# Patient Record
Sex: Male | Born: 1960 | Race: Black or African American | Hispanic: No | Marital: Single | Smoking: Never smoker
Health system: Southern US, Community
[De-identification: ages and names within clinical notes are randomized; demographics above are authoritative.]

## PROBLEM LIST (undated history)

## (undated) DIAGNOSIS — N182 Chronic kidney disease, stage 2 (mild): Secondary | ICD-10-CM

## (undated) DIAGNOSIS — F1010 Alcohol abuse, uncomplicated: Secondary | ICD-10-CM

## (undated) DIAGNOSIS — I5042 Chronic combined systolic (congestive) and diastolic (congestive) heart failure: Secondary | ICD-10-CM

## (undated) DIAGNOSIS — F1410 Cocaine abuse, uncomplicated: Secondary | ICD-10-CM

## (undated) DIAGNOSIS — Z9119 Patient's noncompliance with other medical treatment and regimen: Secondary | ICD-10-CM

## (undated) DIAGNOSIS — D649 Anemia, unspecified: Secondary | ICD-10-CM

## (undated) DIAGNOSIS — I10 Essential (primary) hypertension: Secondary | ICD-10-CM

## (undated) DIAGNOSIS — I2510 Atherosclerotic heart disease of native coronary artery without angina pectoris: Secondary | ICD-10-CM

## (undated) DIAGNOSIS — I255 Ischemic cardiomyopathy: Secondary | ICD-10-CM

## (undated) DIAGNOSIS — E785 Hyperlipidemia, unspecified: Secondary | ICD-10-CM

## (undated) DIAGNOSIS — Z91199 Patient's noncompliance with other medical treatment and regimen due to unspecified reason: Secondary | ICD-10-CM

## (undated) NOTE — *Deleted (*Deleted)
CRITICAL VALUE ALERT  Critical Value:  Lactic acid 2.3  Date & Time Notied:  09/08/20  Provider Notified: X. Blount  Orders Received/Actions taken:

---

## 2013-04-11 ENCOUNTER — Emergency Department (HOSPITAL_COMMUNITY)
Admission: EM | Admit: 2013-04-11 | Discharge: 2013-04-11 | Disposition: A | Discharge status: Home / Self Care | Payer: Self-pay | Attending: Emergency Medicine | Admitting: Emergency Medicine

## 2013-04-11 DIAGNOSIS — M7989 Other specified soft tissue disorders: Secondary | ICD-10-CM | POA: Insufficient documentation

## 2013-04-11 DIAGNOSIS — IMO0001 Reserved for inherently not codable concepts without codable children: Secondary | ICD-10-CM

## 2013-04-11 DIAGNOSIS — L03019 Cellulitis of unspecified finger: Secondary | ICD-10-CM | POA: Insufficient documentation

## 2013-04-11 MED ORDER — HYDROCODONE-ACETAMINOPHEN 5-325 MG PO TABS
1.0000 | ORAL_TABLET | Freq: Four times a day (QID) | ORAL | Status: DC | PRN
Start: 2013-04-11 — End: 2013-06-17

## 2013-04-11 MED ORDER — BUPIVACAINE HCL 0.5 % IJ SOLN
50.0000 mL | Freq: Once | INTRAMUSCULAR | Status: DC
Start: 2013-04-11 — End: 2013-04-11
  Filled 2013-04-11: qty 50

## 2013-04-11 NOTE — ED Provider Notes (Signed)
History     CSN: [id number]  Arrival date & time 04/11/13  0210   First MD Initiated Contact with Patient 04/11/13 0220      Chief Complaint  Patient presents with  . Finger Pain     (Consider location/radiation/quality/duration/timing/severity/associated sxs/prior treatment) HPI This is a 52 year old male who was working in the garden was bitten on the tip of his right middle finger by a green snake 34 days ago. Subsequently he has developed pain and swelling alongside the age of that finger nail. There is moderate associated pain, worse with palpation. Motor and sensory function remained intact. He denies systemic symptoms.  No past medical history on file.  No past surgical history on file.  No family history on file.  History  Substance Use Topics  . Smoking status: Not on file  . Smokeless tobacco: Not on file  . Alcohol Use: Not on file      Review of Systems  All other systems reviewed and are negative.    Allergies  Review of patient's allergies indicates not on file.  Home Medications  No current outpatient prescriptions on file.  BP 168/96  Pulse 73  Temp(Src) 98 F (36.7 C) (Oral)  Resp 16  SpO2 99%  Physical Exam General: Well-developed, well-nourished male in no acute distress; appearance consistent with age of record HENT: normocephalic, atraumatic Eyes: pupils equal round and reactive to light; extraocular muscles intact; arcus senilis bilaterally Neck: supple Heart: regular rate and rhythm Lungs: clear to auscultation bilaterally Abdomen: soft; nondistended Extremities: No deformity; full range of motion; pulses normal; swelling and tenderness around the nail of the right middle finger consistent with paronychia; fingerpad soft with no evidence of a felon Neurologic: Awake, alert and oriented; motor function intact in all extremities and symmetric; no facial droop Skin: Warm and dry Psychiatric: Normal mood and affect    ED Course   Procedures (including critical care time)  INCISION AND DRAINAGE Performed by: Paula Libra L Consent: Verbal consent obtained. Risks and benefits: risks, benefits and alternatives were discussed Type: Paronychia   Body area: Right middle finger  Anesthesia: Digital block  Incision was made with a scalpel.  Local anesthetic: Bupivacaine 0.5% without epinephrine   Anesthetic total: 5 ml  Complexity: Simple Blunt dissection to break up loculations  Drainage: purulent  Drainage amount: Moderate   Packing material: None   Patient tolerance: Patient tolerated the procedure well with no immediate complications.     MDM          Hanley Seamen, MD 04/11/13 [phone]
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
## 2013-04-11 NOTE — ED Notes (Signed)
The patient is AOx4 and comfortable with his discharge instructions. 

## 2013-06-17 ENCOUNTER — Encounter (HOSPITAL_COMMUNITY): Payer: Self-pay | Admitting: *Deleted

## 2013-06-17 ENCOUNTER — Emergency Department (HOSPITAL_COMMUNITY)
Admission: EM | Admit: 2013-06-17 | Discharge: 2013-06-17 | Disposition: A | Discharge status: Home / Self Care | Payer: Self-pay | Attending: Emergency Medicine | Admitting: Emergency Medicine

## 2013-06-17 DIAGNOSIS — F17200 Nicotine dependence, unspecified, uncomplicated: Secondary | ICD-10-CM | POA: Insufficient documentation

## 2013-06-17 DIAGNOSIS — L03011 Cellulitis of right finger: Secondary | ICD-10-CM

## 2013-06-17 DIAGNOSIS — L03019 Cellulitis of unspecified finger: Secondary | ICD-10-CM | POA: Insufficient documentation

## 2013-06-17 DIAGNOSIS — I10 Essential (primary) hypertension: Secondary | ICD-10-CM | POA: Insufficient documentation

## 2013-06-17 HISTORY — DX: Essential (primary) hypertension: I10

## 2013-06-17 MED ORDER — CEPHALEXIN 500 MG PO CAPS: 500.0000 mg | ORAL_CAPSULE | Freq: Three times a day (TID) | ORAL | Status: DC

## 2013-06-17 MED ORDER — HYDROCODONE-ACETAMINOPHEN 5-325 MG PO TABS
1.0000 | ORAL_TABLET | Freq: Once | ORAL | Status: AC
  Administered 2013-06-17: 1 via ORAL
  Filled 2013-06-17: qty 1

## 2013-06-17 MED ORDER — HYDROCODONE-ACETAMINOPHEN 5-325 MG PO TABS: 1.0000 | ORAL_TABLET | ORAL | Status: DC | PRN

## 2013-06-17 NOTE — ED Provider Notes (Signed)
  CSN: [id number]     Arrival date & time 06/17/13  0018 History     First MD Initiated Contact with Patient 06/17/13 0026     Chief Complaint  Patient presents with  . Hand Pain   HPI  History provided by the patient. Patient is a 52 year old male with history of hypertension who presents with complaints of worsening pain and swelling around his fingernail. Symptoms have been present for the past 3-4 days. Patient does report similar symptoms to the same middle finger on the right hand several months ago. At that time he was also seen and had the area drained. He has tried to push on the area but there has been no drainage. Denies any other treatments. Symptoms are worse with any palpation or pressure. He denies any associated fever, chills or sweats. No other aggravating or alleviating factors. No other associated symptoms.    Past Medical History  Diagnosis Date  . Hypertension    History reviewed. No pertinent past surgical history. No family history on file. History  Substance Use Topics  . Smoking status: Current Every Day Smoker  . Smokeless tobacco: Not on file  . Alcohol Use: Yes    Review of Systems  Constitutional: Negative for fever, chills and diaphoresis.  All other systems reviewed and are negative.    Allergies  Review of patient's allergies indicates no known allergies.  Home Medications   Current Outpatient Rx  Name  Route  Sig  Dispense  Refill  . omeprazole (PRILOSEC) 20 MG capsule   Oral   Take 20 mg by mouth 2 (two) times daily.          BP 178/110  Pulse 99  Temp(Src) 98.2 F (36.8 C) (Oral)  Resp 18  SpO2 97% Physical Exam  Nursing note and vitals reviewed. Constitutional: He is oriented to person, place, and time. He appears well-developed and well-nourished. No distress.  HENT:  Head: Normocephalic.  Cardiovascular: Normal rate and regular rhythm.   Pulmonary/Chest: Effort normal and breath sounds normal.  Musculoskeletal: Normal  range of motion.  Moderate swelling with erythema and fluctuance around the base of the right third fingernail. No active bleeding or drainage. Normal sensations to the tip of the finger  Neurological: He is alert and oriented to person, place, and time.  Skin: Skin is warm.  Psychiatric: He has a normal mood and affect. His behavior is normal.    ED Course   Procedures   INCISION AND DRAINAGE Performed by: Angus Seller Consent: Verbal consent obtained. Risks and benefits: risks, benefits and alternatives were discussed Type: Paronychia abscess  Body area: Right third finger  Anesthesia: local infiltration  Incision was made with a scalpel.   digital block anesthetic: lidocaine 2% without epinephrine  Anesthetic total: 5 ml  Complexity: Simple  Drainage: purulent  Drainage amount: Moderate   Patient tolerance: Patient tolerated the procedure well with no immediate complications.       1. Paronychia, right     MDM  Patient seen and evaluated. The patient appears well no acute distress. There is a significant paronychia some ingrown nail to the tissue.  Rt middle finger  Angus Seller, [place] 06/18/13 2253

## 2013-06-17 NOTE — ED Notes (Signed)
The pt has an infection around his rt middle fingernail for 3-4 days.  He had the same thing  2 months ago in th same finger

## 2013-06-19 NOTE — ED Provider Notes (Signed)
Medical screening examination/treatment/procedure(s) were performed by non-physician practitioner and as supervising physician I was immediately available for consultation/collaboration.  Zea Kostka M Sharone Almond, MD 06/19/13 0750 

## 2014-09-20 ENCOUNTER — Emergency Department (HOSPITAL_COMMUNITY)
Admission: EM | Admit: 2014-09-20 | Discharge: 2014-09-20 | Disposition: A | Discharge status: Home / Self Care | Payer: Self-pay | Attending: Emergency Medicine | Admitting: Emergency Medicine

## 2014-09-20 ENCOUNTER — Encounter (HOSPITAL_COMMUNITY): Payer: Self-pay | Admitting: Emergency Medicine

## 2014-09-20 DIAGNOSIS — Z792 Long term (current) use of antibiotics: Secondary | ICD-10-CM | POA: Insufficient documentation

## 2014-09-20 DIAGNOSIS — Y92410 Unspecified street and highway as the place of occurrence of the external cause: Secondary | ICD-10-CM | POA: Insufficient documentation

## 2014-09-20 DIAGNOSIS — Y998 Other external cause status: Secondary | ICD-10-CM | POA: Insufficient documentation

## 2014-09-20 DIAGNOSIS — Z720 Tobacco use: Secondary | ICD-10-CM | POA: Insufficient documentation

## 2014-09-20 DIAGNOSIS — I10 Essential (primary) hypertension: Secondary | ICD-10-CM | POA: Insufficient documentation

## 2014-09-20 DIAGNOSIS — Y9301 Activity, walking, marching and hiking: Secondary | ICD-10-CM | POA: Insufficient documentation

## 2014-09-20 DIAGNOSIS — S0101XA Laceration without foreign body of scalp, initial encounter: Secondary | ICD-10-CM | POA: Insufficient documentation

## 2014-09-20 DIAGNOSIS — Z79899 Other long term (current) drug therapy: Secondary | ICD-10-CM | POA: Insufficient documentation

## 2014-09-20 MED ORDER — OXYCODONE-ACETAMINOPHEN 5-325 MG PO TABS
2.0000 | ORAL_TABLET | Freq: Once | ORAL | Status: AC
  Administered 2014-09-20: 2 via ORAL
  Filled 2014-09-20: qty 2

## 2014-09-20 MED ORDER — OXYCODONE-ACETAMINOPHEN 5-325 MG PO TABS: 2.0000 | ORAL_TABLET | ORAL | Status: DC | PRN

## 2014-09-20 MED ORDER — LIDOCAINE-EPINEPHRINE 1 %-1:100000 IJ SOLN
20.0000 mL | Freq: Once | INTRAMUSCULAR | Status: AC
Start: 2014-09-20 — End: 2014-09-20
  Administered 2014-09-20: 20 mL via INTRADERMAL
  Filled 2014-09-20: qty 1

## 2014-09-20 NOTE — ED Notes (Signed)
Suture cart present at the bedside.  
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
## 2014-09-20 NOTE — Discharge Instructions (Signed)
Keep wound clean. Return to the ED in 5 days for staple removal. Refer to attached documents for more information.
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
## 2014-09-30 ENCOUNTER — Emergency Department (HOSPITAL_COMMUNITY)
Admission: EM | Admit: 2014-09-30 | Discharge: 2014-09-30 | Disposition: A | Discharge status: Home / Self Care | Payer: Self-pay | Attending: Emergency Medicine | Admitting: Emergency Medicine

## 2014-09-30 ENCOUNTER — Encounter (HOSPITAL_COMMUNITY): Payer: Self-pay | Admitting: *Deleted

## 2014-09-30 DIAGNOSIS — I10 Essential (primary) hypertension: Secondary | ICD-10-CM | POA: Insufficient documentation

## 2014-09-30 DIAGNOSIS — Z4802 Encounter for removal of sutures: Secondary | ICD-10-CM | POA: Insufficient documentation

## 2014-09-30 DIAGNOSIS — Z79899 Other long term (current) drug therapy: Secondary | ICD-10-CM | POA: Insufficient documentation

## 2014-09-30 DIAGNOSIS — Z720 Tobacco use: Secondary | ICD-10-CM | POA: Insufficient documentation

## 2014-09-30 NOTE — ED Notes (Signed)
Pt in to have staples removed from left forehead area, placed on 11/17, no distress noted

## 2014-09-30 NOTE — Discharge Instructions (Signed)
Please read and follow all provided instructions.  Your diagnoses today include:  1. Encounter for staple removal    Tests performed today include:  Vital signs. See below for your results today.   Medications prescribed:   None  Home care instructions:  Follow any educational materials contained in this packet.  Follow-up instructions: Please follow-up with your primary care provider as needed for further evaluation of your symptoms.  Return instructions:   Please return to the Emergency Department if you experience worsening symptoms.   Please return if you have any other emergent concerns.  Additional Information:  Your vital signs today were: BP 174/100 mmHg   Pulse 78   Temp(Src) 98.2 F (36.8 C) (Oral)   Resp 18   Ht 5\' 11"  (1.803 m)   Wt 290 lb (131.543 kg)   BMI 40.46 kg/m2   SpO2 99% If your blood pressure (BP) was elevated above 135/85 this visit, please have this repeated by your doctor within one month. ---------------
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
## 2015-02-23 ENCOUNTER — Emergency Department (HOSPITAL_COMMUNITY)
Admission: EM | Admit: 2015-02-23 | Discharge: 2015-02-24 | Disposition: A | Discharge status: Home / Self Care | Payer: Self-pay | Attending: Emergency Medicine | Admitting: Emergency Medicine

## 2015-02-23 ENCOUNTER — Encounter (HOSPITAL_COMMUNITY): Payer: Self-pay | Admitting: Emergency Medicine

## 2015-02-23 ENCOUNTER — Emergency Department (HOSPITAL_COMMUNITY): Payer: Self-pay

## 2015-02-23 DIAGNOSIS — Z720 Tobacco use: Secondary | ICD-10-CM | POA: Insufficient documentation

## 2015-02-23 DIAGNOSIS — R066 Hiccough: Secondary | ICD-10-CM | POA: Insufficient documentation

## 2015-02-23 DIAGNOSIS — Z79899 Other long term (current) drug therapy: Secondary | ICD-10-CM | POA: Insufficient documentation

## 2015-02-23 DIAGNOSIS — I10 Essential (primary) hypertension: Secondary | ICD-10-CM | POA: Insufficient documentation

## 2015-02-23 DIAGNOSIS — R0602 Shortness of breath: Secondary | ICD-10-CM | POA: Insufficient documentation

## 2015-02-23 DIAGNOSIS — R079 Chest pain, unspecified: Secondary | ICD-10-CM | POA: Insufficient documentation

## 2015-02-23 DIAGNOSIS — R1111 Vomiting without nausea: Secondary | ICD-10-CM | POA: Insufficient documentation

## 2015-02-23 LAB — COMPREHENSIVE METABOLIC PANEL
ALT: 20 U/L (ref 0–53)
AST: 24 U/L (ref 0–37)
Albumin: 3.8 g/dL (ref 3.5–5.2)
Alkaline Phosphatase: 78 U/L (ref 39–117)
Anion gap: 9 (ref 5–15)
BUN: 24 mg/dL — ABNORMAL HIGH (ref 6–23)
CO2: 26 mmol/L (ref 19–32)
Calcium: 9 mg/dL (ref 8.4–10.5)
Chloride: 105 mmol/L (ref 96–112)
Creatinine, Ser: 1.55 mg/dL — ABNORMAL HIGH (ref 0.50–1.35)
GFR calc Af Amer: 58 mL/min — ABNORMAL LOW (ref >90)
GFR calc non Af Amer: 50 mL/min — ABNORMAL LOW (ref >90)
Glucose, Bld: 120 mg/dL — ABNORMAL HIGH (ref 70–99)
Potassium: 3 mmol/L — ABNORMAL LOW (ref 3.5–5.1)
Sodium: 140 mmol/L (ref 135–145)
Total Bilirubin: 0.7 mg/dL (ref 0.3–1.2)
Total Protein: 7.1 g/dL (ref 6.0–8.3)

## 2015-02-23 LAB — CBC WITH DIFFERENTIAL/PLATELET
Basophils Absolute: 0 10*3/uL (ref 0.0–0.1)
Basophils Relative: 0 % (ref 0–1)
Eosinophils Absolute: 0.2 10*3/uL (ref 0.0–0.7)
Eosinophils Relative: 2 % (ref 0–5)
HCT: 39.5 % (ref 39.0–52.0)
Hemoglobin: 13.5 g/dL (ref 13.0–17.0)
Lymphocytes Relative: 23 % (ref 12–46)
Lymphs Abs: 1.8 10*3/uL (ref 0.7–4.0)
MCH: 29 pg (ref 26.0–34.0)
MCHC: 34.2 g/dL (ref 30.0–36.0)
MCV: 84.8 fL (ref 78.0–100.0)
Monocytes Absolute: 0.5 10*3/uL (ref 0.1–1.0)
Monocytes Relative: 6 % (ref 3–12)
Neutro Abs: 5.5 10*3/uL (ref 1.7–7.7)
Neutrophils Relative %: 69 % (ref 43–77)
Platelets: 326 10*3/uL (ref 150–400)
RBC: 4.66 MIL/uL (ref 4.22–5.81)
RDW: 14.6 % (ref 11.5–15.5)
WBC: 8.1 10*3/uL (ref 4.0–10.5)

## 2015-02-23 MED ORDER — PANTOPRAZOLE SODIUM 40 MG IV SOLR
40.0000 mg | Freq: Once | INTRAVENOUS | Status: AC
  Administered 2015-02-23: 40 mg via INTRAVENOUS
  Filled 2015-02-23: qty 40

## 2015-02-23 MED ORDER — SODIUM CHLORIDE 0.9 % IV SOLN
25.0000 mg | Freq: Once | INTRAVENOUS | Status: AC
  Administered 2015-02-23: 25 mg via INTRAVENOUS
  Filled 2015-02-23: qty 1

## 2015-02-23 MED ORDER — ONDANSETRON HCL 4 MG/2ML IJ SOLN
4.0000 mg | Freq: Once | INTRAMUSCULAR | Status: AC
  Administered 2015-02-23: 4 mg via INTRAVENOUS
  Filled 2015-02-23: qty 2

## 2015-02-23 MED ORDER — DIAZEPAM 5 MG/ML IJ SOLN
5.0000 mg | Freq: Once | INTRAMUSCULAR | Status: AC
  Administered 2015-02-23: 5 mg via INTRAVENOUS
  Filled 2015-02-23: qty 2

## 2015-02-23 NOTE — ED Notes (Signed)
Pt. reports persistent emesis and hiccups onset yesterday , pt. stated he drinks ( ETOH )  almost everyday and smoked marijuana yesterday . Denies fever or chills. Respirations unlabored.
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
## 2015-02-24 ENCOUNTER — Emergency Department (HOSPITAL_COMMUNITY): Payer: Self-pay

## 2015-02-24 ENCOUNTER — Encounter (HOSPITAL_COMMUNITY): Payer: Self-pay

## 2015-02-24 MED ORDER — LEVOFLOXACIN 500 MG PO TABS: 500.0000 mg | ORAL_TABLET | Freq: Every day | ORAL | Status: DC

## 2015-02-24 MED ORDER — PREDNISONE 20 MG PO TABS
20.0000 mg | ORAL_TABLET | Freq: Two times a day (BID) | ORAL | Status: DC
Start: 2015-02-24 — End: 2015-12-05

## 2015-02-24 MED ORDER — ALBUTEROL SULFATE HFA 108 (90 BASE) MCG/ACT IN AERS: 1.0000 | INHALATION_SPRAY | Freq: Four times a day (QID) | RESPIRATORY_TRACT | Status: DC | PRN

## 2015-02-24 MED ORDER — BENZONATATE 100 MG PO CAPS: 100.0000 mg | ORAL_CAPSULE | Freq: Three times a day (TID) | ORAL | Status: DC

## 2015-02-24 MED ORDER — ONDANSETRON 4 MG PO TBDP: 4.0000 mg | ORAL_TABLET | Freq: Three times a day (TID) | ORAL | Status: DC | PRN

## 2015-02-24 MED ORDER — CHLORPROMAZINE HCL 10 MG PO TABS: 10.0000 mg | ORAL_TABLET | Freq: Three times a day (TID) | ORAL | Status: DC | PRN

## 2015-02-24 MED ORDER — OMEPRAZOLE 20 MG PO CPDR: 20.0000 mg | DELAYED_RELEASE_CAPSULE | Freq: Two times a day (BID) | ORAL | Status: DC

## 2015-02-24 NOTE — ED Notes (Signed)
Pt verbalized understanding of d/c instructions and has no further questions.  

## 2015-02-24 NOTE — ED Provider Notes (Signed)
Patient care acquired from Dr. Fayrene Fearing pending CT chest. No evidence of widened mediastenium on CT scan. Will d/c home with symptomatic measures.  Results for orders placed or performed during the hospital encounter of 02/23/15  CBC with Differential  Result Value Ref Range   WBC 8.1 4.0 - 10.5 K/uL   RBC 4.66 4.22 - 5.81 MIL/uL   Hemoglobin 13.5 13.0 - 17.0 g/dL   HCT 56.2 13.0 - 86.5 %   MCV 84.8 78.0 - 100.0 fL   MCH 29.0 26.0 - 34.0 pg   MCHC 34.2 30.0 - 36.0 g/dL   RDW 78.4 69.6 - 29.5 %   Platelets 326 150 - 400 K/uL   Neutrophils Relative % 69 43 - 77 %   Neutro Abs 5.5 1.7 - 7.7 K/uL   Lymphocytes Relative 23 12 - 46 %   Lymphs Abs 1.8 0.7 - 4.0 K/uL   Monocytes Relative 6 3 - 12 %   Monocytes Absolute 0.5 0.1 - 1.0 K/uL   Eosinophils Relative 2 0 - 5 %   Eosinophils Absolute 0.2 0.0 - 0.7 K/uL   Basophils Relative 0 0 - 1 %   Basophils Absolute 0.0 0.0 - 0.1 K/uL  Comprehensive metabolic panel  Result Value Ref Range   Sodium 140 135 - 145 mmol/L   Potassium 3.0 (L) 3.5 - 5.1 mmol/L   Chloride 105 96 - 112 mmol/L   CO2 26 19 - 32 mmol/L   Glucose, Bld 120 (H) 70 - 99 mg/dL   BUN 24 (H) 6 - 23 mg/dL   Creatinine, Ser 2.84 (H) 0.50 - 1.35 mg/dL   Calcium 9.0 8.4 - 13.2 mg/dL   Total Protein 7.1 6.0 - 8.3 g/dL   Albumin 3.8 3.5 - 5.2 g/dL   AST 24 0 - 37 U/L   ALT 20 0 - 53 U/L   Alkaline Phosphatase 78 39 - 117 U/L   Total Bilirubin 0.7 0.3 - 1.2 mg/dL   GFR calc non Af Amer 50 (L) >90 mL/min   GFR calc Af Amer 58 (L) >90 mL/min   Anion gap 9 5 - 15   Ct Chest Wo Contrast  02/24/2015   CLINICAL DATA:  Vomiting, hit cups, chest pain starting yesterday.  EXAM: CT CHEST WITHOUT CONTRAST  TECHNIQUE: Multidetector CT imaging of the chest was performed following the standard protocol without IV contrast.  COMPARISON:  Chest x-ray 02/23/2015  FINDINGS: CT CHEST FINDINGS  Mediastinum/Nodes: No mediastinal, hilar, or axillary adenopathy. Heart is normal size. Aorta is normal  caliber. Scattered calcifications in the aortic arch. No evidence of aneurysm.  Lungs/Pleura: Lungs are clear. No focal airspace opacities or suspicious nodules. No effusions.  Musculoskeletal: Mild gynecomastia, left greater than right. Otherwise, chest wall soft tissues are unremarkable. No acute bony abnormality or focal bone lesion.  Upper abdomen: Imaging into the upper abdomen shows no acute findings.  IMPRESSION: No acute cardiopulmonary disease.   Electronically Signed   By: Charlett Nose M.D.   On: 02/24/2015 01:05   Dg Chest Port 1 View  02/23/2015   CLINICAL DATA:  Shortness of breath.  EXAM: PORTABLE CHEST - 1 VIEW  COMPARISON:  None.  FINDINGS: Heart size upper normal. Fullness of the left suprahilar mediastinum, which may reflect aortic tortuosity and/or dilatation. Mild rightward displacement of the trachea. Hilar contours within normal limits. No confluent airspace opacity, pleural effusion, pneumothorax. Limited osseous assessment due to underpenetration and portable technique.  IMPRESSION: Prominent left mediastinal contour. Recommend  chest CTA to assess for aortic tortuosity/dilatation or mass.   Electronically Signed   By: Jearld Lesch M.D.   On: 02/23/2015 22:46    1. Hiccups   2. SOB (shortness of breath)    Patient d/w with Dr. Fayrene Fearing, agrees with plan.   Francee Piccolo, PA-C 02/24/15 [id number]  Rolland Porter, MD 02/25/15 1723

## 2015-02-24 NOTE — Discharge Instructions (Signed)
°  Do not overeat.  Small meals. No alcohol. No carbonated beverages (Beer, soda) Rx:  Thorazine for hiccups, may cause drowsiness.  Take as prescribed until 24 hours without hiccups. Prilosec--Acid blocker for GERD/Reflux. Zofran--for nausea.    Hiccups A hiccup is the result of a sudden shortening of the muscle below your lungs (diaphragm). This movement of your diaphragm is then followed by the closing of your vocal cords, which causes the hiccup sound. Most people get the hiccups. Typically, hiccups last only a short amount of time. There are three types of hiccups:   Benign: last less than 48 hours.  Persistent: last more than 48 hours, but less than 1 month.  Intractable: last more than 1 month. A hiccup is a reflex. You cannot control reflexes. CAUSES  Causes of the hiccups can include:   Eating too much.  Drinking too much alcohol or fizzy drinks.  Eating too fast.  Eating or drinking hot and spicy foods or drinks.  Using certain medicines that have hiccupping as a side effect. Several medical conditions may also cause hiccups, including, but not limited to:  Stroke.  Gastroesophageal reflux.  Multiple sclerosis.  Traumatic brain injury.  Brain tumor.  Meningitis.  Having damage to the nerve that affects the diaphragm. Usually, though, hiccups have no apparent cause and are not the result of a serious medical condition. DIAGNOSIS  Tests may be performed to diagnose a possible condition associated with persistent or intractable hiccups. TREATMENT  Most cases of the hiccups need no treatment. None of the numerous home remedies have been proven to be effective. If your hiccups do require treatment, your treatment may include:  Medicine. Medicine may be given intravenously (by IV) or by mouth.  Hypnosis or acupuncture.  Surgery to the nerve that affects the diaphragm may be tried in severe cases. If your hiccups are caused by an underlying medical  condition, treatment for the medical condition may be necessary.  HOME CARE INSTRUCTIONS   Eat small meals.  Limit alcohol intake to no more than 1 drink per day for nonpregnant women and 2 drinks per day for men. One drink equals 12 ounces of beer, 5 ounces of wine, or 1 ounces of hard liquor.  Limit drinking fizzy drinks.  Eat and chew your food slowly.  Take medicines only as directed by your health care provider. SEEK MEDICAL CARE IF:   Your hiccups last for more than 48 hours.  You are given medicine, but your hiccups do not get better.  You cannot sleep or eat due to the hiccups.  You have unexpected weight loss due to the hiccups.  You have trouble breathing or swallowing.  You have a fever.  You develop severe pain in your abdomen.  You develop numbness, tingling, or weakness. Document Released: 12/30/2001 Document Revised: 03/07/2014 Document Reviewed: 12/12/2010 [facility] Patient Information 2015 [place], [place]. This information is not intended to replace advice given to you by your health care provider. Make sure you discuss any questions you have with your health care provider.

## 2015-12-05 ENCOUNTER — Encounter (HOSPITAL_COMMUNITY): Payer: Self-pay

## 2015-12-05 ENCOUNTER — Emergency Department (HOSPITAL_COMMUNITY)
Admission: EM | Admit: 2015-12-05 | Discharge: 2015-12-05 | Disposition: A | Discharge status: Home / Self Care | Payer: Self-pay | Attending: Emergency Medicine | Admitting: Emergency Medicine

## 2015-12-05 DIAGNOSIS — I10 Essential (primary) hypertension: Secondary | ICD-10-CM | POA: Insufficient documentation

## 2015-12-05 DIAGNOSIS — R066 Hiccough: Secondary | ICD-10-CM | POA: Insufficient documentation

## 2015-12-05 MED ORDER — ONDANSETRON 4 MG PO TBDP
4.0000 mg | ORAL_TABLET | Freq: Once | ORAL | Status: AC
  Administered 2015-12-05: 4 mg via ORAL
  Filled 2015-12-05: qty 1

## 2015-12-05 MED ORDER — CHLORPROMAZINE HCL 25 MG PO TABS
25.0000 mg | ORAL_TABLET | Freq: Once | ORAL | Status: AC
  Administered 2015-12-05: 25 mg via ORAL
  Filled 2015-12-05: qty 1

## 2015-12-05 MED ORDER — ONDANSETRON 4 MG PO TBDP: 4.0000 mg | ORAL_TABLET | Freq: Three times a day (TID) | ORAL | Status: DC | PRN

## 2015-12-05 MED ORDER — CHLORPROMAZINE HCL 10 MG PO TABS: 10.0000 mg | ORAL_TABLET | Freq: Three times a day (TID) | ORAL | Status: DC | PRN

## 2015-12-05 NOTE — ED Notes (Signed)
Pt. Coming from home via POV c/o hiccups that started at 0400 with 4 episodes of vomiting. Pt. Experienced same episodes last year and had to be treated here with thorazine per patient.

## 2015-12-05 NOTE — ED Provider Notes (Signed)
CSN: [id number]     Arrival date & time 12/05/15  0745 History   First MD Initiated Contact with Patient 12/05/15 [phone]     Chief Complaint  Patient presents with  . Hiccups    with emesis   HPI   55 year old male presents today with hiccups. Patient reports a significant past medical history of the same, for which she was seen in the emergency room and treated with Thorazine with successful resolution. He reports today's episode is identical to previous with waking this morning at 4 AM with persistent hiccups. Patient reports eating lasagna last night which she feels may have caused today's presentation. Patient reports he drank milk as this seemed to help the past, he was noted to have an episode of vomiting after drinking the nail. No subsequent vomiting noted. Patient denies any cardiac problems, diabetes, chest pain, shortness of breath, dizziness, or any other complaints other than the hiccups. Patient denies any swelling to his neck.   Past Medical History  Diagnosis Date  . Hypertension    History reviewed. No pertinent past surgical history. History reviewed. No pertinent family history. Social History  Substance Use Topics  . Smoking status: Never Smoker   . Smokeless tobacco: None  . Alcohol Use: No    Review of Systems  All other systems reviewed and are negative.   Allergies  Review of patient's allergies indicates no known allergies.  Home Medications   Prior to Admission medications   Medication Sig Start Date End Date Taking? Authorizing Provider  albuterol (PROVENTIL HFA;VENTOLIN HFA) 108 (90 BASE) MCG/ACT inhaler Inhale 1-2 puffs into the lungs every 6 (six) hours as needed for wheezing. Patient not taking: Reported on 12/05/2015 02/24/15   Rolland Porter, MD  benzonatate (TESSALON) 100 MG capsule Take 1 capsule (100 mg total) by mouth every 8 (eight) hours. Patient not taking: Reported on 12/05/2015 02/24/15   Rolland Porter, MD  cephALEXin (KEFLEX) 500 MG capsule Take 1  capsule (500 mg total) by mouth 3 (three) times daily. Patient not taking: Reported on 09/20/2014 06/17/13   Ivonne Andrew, PA-C  chlorproMAZINE (THORAZINE) 10 MG tablet Take 1 tablet (10 mg total) by mouth 3 (three) times daily as needed for hiccoughs. 12/05/15   Eyvonne Mechanic, PA-C  HYDROcodone-acetaminophen (NORCO) 5-325 MG per tablet Take 1 tablet by mouth every 4 (four) hours as needed for pain. Patient not taking: Reported on 09/20/2014 06/17/13   Ivonne Andrew, PA-C  levofloxacin (LEVAQUIN) 500 MG tablet Take 1 tablet (500 mg total) by mouth daily. Patient not taking: Reported on 12/05/2015 02/24/15   Rolland Porter, MD  omeprazole (PRILOSEC) 20 MG capsule Take 1 capsule (20 mg total) by mouth 2 (two) times daily. Patient not taking: Reported on 12/05/2015 02/24/15   Rolland Porter, MD  ondansetron (ZOFRAN ODT) 4 MG disintegrating tablet Take 1 tablet (4 mg total) by mouth every 8 (eight) hours as needed for nausea. 12/05/15   Eyvonne Mechanic, PA-C  oxyCODONE-acetaminophen (PERCOCET/ROXICET) 5-325 MG per tablet Take 2 tablets by mouth every 4 (four) hours as needed for moderate pain or severe pain. Patient not taking: Reported on 12/05/2015 09/20/14   Emilia Beck, PA-C  predniSONE (DELTASONE) 20 MG tablet Take 1 tablet (20 mg total) by mouth 2 (two) times daily with a meal. Patient not taking: Reported on 12/05/2015 02/24/15   Rolland Porter, MD   BP 162/109 mmHg  Pulse 81  Temp(Src) 97.7 F (36.5 C) (Oral)  Resp 26  Ht  (1.803 m)  Wt 106.595 kg  BMI 32.79 kg/m2  SpO2 100%   Physical Exam  Constitutional: He is oriented to person, place, and time. He appears well-developed and well-nourished.  HENT:  Head: Normocephalic and atraumatic.  Right Ear: Hearing, tympanic membrane and external ear normal. No middle ear effusion.  Left Ear: Hearing, tympanic membrane and external ear normal.  No middle ear effusion.  Eyes: Conjunctivae are normal. Pupils are equal, round, and reactive to light.  Right eye exhibits no discharge. Left eye exhibits no discharge. No scleral icterus.  Neck: Normal range of motion. Neck supple. No JVD present. No tracheal deviation present. No thyromegaly present.  Cardiovascular: Normal rate, regular rhythm and intact distal pulses.  Exam reveals no gallop and no friction rub.   No murmur heard. Pulmonary/Chest: Effort normal and breath sounds normal. No stridor. No respiratory distress. He has no wheezes. He has no rales. He exhibits no tenderness.  Abdominal: Soft. He exhibits no distension. There is no tenderness.  Musculoskeletal: Normal range of motion. He exhibits no edema.  Neurological: He is alert and oriented to person, place, and time. Coordination normal.  Skin: Skin is warm and dry. No rash noted. No erythema. No pallor.  Psychiatric: He has a normal mood and affect. His behavior is normal. Judgment and thought content normal.  Nursing note and vitals reviewed.   ED Course  Procedures (including critical care time) Labs Review Labs Reviewed - No data to display  Imaging Review No results found. I have personally reviewed and evaluated these images and lab results as part of my medical decision-making.   EKG Interpretation None      MDM   Final diagnoses:  Hiccups    Labs:   Imaging:  Consults:  Therapeutics: Thorazine   Discharge Meds: thorazine, zofran   Assessment/Plan: Pt's presentation most consistent with uncomplicated hiccups. Today's presentation identical to previous. Patient notes no other symptoms of cardiac dysfunction, pulmonary etiology, metabolic etiology, or any other concerning finding here today. Patient was treated with Thorazine here in the ED. patient had brief resolution of symptoms, they returned shortly after. He was given Zofran with sustained resolution. Patient continues to be asymptomatic with no other acute signs or symptoms. Tolerating PO.  Patient will be discharged home with primary care  follow-up. He is instructed to return immediately if any new or worsening signs or symptoms present. Patient verbalized understanding and agreement for today's plan and had no further questions or concerns at time of discharge.         Eyvonne Mechanic, PA-C 12/05/15 1000  Tilden Fossa, MD 12/06/15 1235

## 2015-12-05 NOTE — Discharge Instructions (Signed)
Hiccups °A hiccup is the result of a sudden shortening of the muscle below your lungs (diaphragm). This movement of your diaphragm causes a sudden inhalation followed by the closing of your vocal cords, which causes the hiccup sound. Most people get the hiccups. Typically, hiccups last only a short amount of time.  °There are three types of hiccups:  °· Benign. These hiccups last less than 48 hours.   °· Persistent. These hiccups last more than 48 hours, but less than 1 month.   °· Intractable. These hiccups last more than 1 month.   °A hiccup is a reflex. You cannot control reflexes.  °HOME CARE INSTRUCTIONS  °Watch your hiccups for any changes. The following actions may help to lessen any discomfort that you are feeling: °· Eat small meals.   °· Limit alcohol intake to no more than 1 drink per day for nonpregnant women and 2 drinks per day for men. One drink equals 12 oz of beer, 5 oz of wine, or 1½ oz of hard liquor. °· Limit drinking carbonated or fizzy drinks, such as soda. °· Eat and chew your food slowly.   °· Avoid eating or drinking hot or spicy foods and drinks. °· Take medicines only as directed by your health care provider.   °SEEK MEDICAL CARE IF:  °· Your hiccups last for more than 48 hours.   °· Your hiccups do not improve with treatment. °· You cannot sleep or eat due to the hiccups.   °· You have unexpected weight loss due to the hiccups.   °· You have a fever.   °· You have trouble breathing or swallowing.   °· You develop severe pain in your abdomen. °· You develop numbness, tingling, or weakness. °  °This information is not intended to replace advice given to you by your health care provider. Make sure you discuss any questions you have with your health care provider. °  °Document Released: 12/30/2001 Document Revised: 03/07/2015 Document Reviewed: 10/17/2014 °Elsevier Interactive Patient Education ©2016 [employer] ° °

## 2015-12-05 NOTE — ED Provider Notes (Signed)
CSN: [id number]     Arrival date & time 12/05/15  1019 History   First MD Initiated Contact with Patient 12/05/15 1025     Chief Complaint  Patient presents with  . Hiccups   HPI   55 year old male presents today for the second time. He was previously seen by myself, was feeling better with no nausea vomiting or hiccups. He reports that after getting into the waiting room he began having hiccups again, not as severe as previous. He said that he contacted the pharmacy who reported the medication would be $160, patient returned to the emergency room requesting Thorazine. Brief review of patient's history includes episode is identical to previous with waking this morning at 4 AM with persistent hiccups. She reports eating lasagna last night which he feels may have caused today's presentation. Patient reports he drank milk as it seemed to help in the past, and noted that this caused an episode of vomiting after drinking. Patient denies any significant past cardiac or pulmonary health history, denies diabetes, chest pain, shortness of breath, dizziness, swelling of the neck, or any other complaints other than the hiccups. Patient requesting 1 more dose of Thorazine and DC home.   Past Medical History  Diagnosis Date  . Hypertension    History reviewed. No pertinent past surgical history. History reviewed. No pertinent family history. Social History  Substance Use Topics  . Smoking status: Never Smoker   . Smokeless tobacco: None  . Alcohol Use: No    Review of Systems  All other systems reviewed and are negative.   Allergies  Review of patient's allergies indicates no known allergies.  Home Medications   Prior to Admission medications   Medication Sig Start Date End Date Taking? Authorizing Provider  chlorproMAZINE (THORAZINE) 10 MG tablet Take 1 tablet (10 mg total) by mouth 3 (three) times daily as needed for hiccoughs. 12/05/15  Yes Seeley Hissong, PA-C  ondansetron (ZOFRAN ODT) 4 MG  disintegrating tablet Take 1 tablet (4 mg total) by mouth every 8 (eight) hours as needed for nausea. 12/05/15  Yes Mitchelle Goerner, PA-C   BP 155/107 mmHg  Pulse 72  Temp(Src) 98.5 F (36.9 C) (Oral)  Resp 15  SpO2 100%   Physical Exam  Constitutional: He is oriented to person, place, and time. He appears well-developed and well-nourished.  HENT:  Head: Normocephalic and atraumatic.  Eyes: Conjunctivae are normal. Pupils are equal, round, and reactive to light. Right eye exhibits no discharge. Left eye exhibits no discharge. No scleral icterus.  Neck: Normal range of motion. No JVD present. No tracheal deviation present.  Pulmonary/Chest: Effort normal. No stridor.  Neurological: He is alert and oriented to person, place, and time. Coordination normal.  Psychiatric: He has a normal mood and affect. His behavior is normal. Judgment and thought content normal.  Nursing note and vitals reviewed.     ED Course  Procedures (including critical care time) Labs Review Labs Reviewed - No data to display  Imaging Review No results found. I have personally reviewed and evaluated these images and lab results as part of my medical decision-making.   EKG Interpretation None      MDM   Final diagnoses:  Hiccups    Labs:   Imaging:  Consults:  Therapeutics:  Discharge Meds:   Assessment/Plan: 55 year old male presents again for hiccups. I saw him several minutes ago, he was reporting no more hiccups, nausea or vomiting. Requesting discharge home. He had a return of symptoms while in  the waiting room. Upon my second evaluation patient reports that he attempted to call his pharmacy in the medication was too expensive requesting another dose here. I informed patient that because hiccups returned again, I would like to do further workup to rule out any significant underlying etiology. I very low suspicion for secondary etiology but due to patient's persistent symptoms I felt it was  necessary. Patient reports that he will not allow me to complete any secondary evaluation, requests a dose of Thorazine and would like to be discharged immediately. Patient is aggressive this time and unhappy. I assured patient that I would give him another dose of the Thorazine, but I would like to continue a secondary evaluation to rule out any potentially life-threatening etiology. Patient again insisted that he did not want any workup, he has no concern for any other etiology other than uncomplicated hiccups. Patient's wife was in the room at the same time, she understood and agreed to today's plan. Patient verbalized understanding and agreement to Thorazine and DC home. Patient allowed me to contact case management who would be old assistant medication. Patient was given 1 more dose of Thorazine here. Patient reports improvement in symptoms requesting discharge home once again.          Eyvonne Mechanic, PA-C 12/05/15 1721  Tilden Fossa, MD 12/06/15 1235

## 2015-12-05 NOTE — ED Notes (Signed)
Pt. Returning after discharge for hiccups that started at 0400 this morning. Pt. Given thorazine and zofran rx to take home with him. Pt. Checked back in stating that hiccups have not gotten better.

## 2015-12-05 NOTE — ED Notes (Signed)
Pt. Refusing EKG and blood tests.

## 2016-01-01 ENCOUNTER — Ambulatory Visit: Payer: Self-pay | Admitting: Family Medicine

## 2016-01-17 ENCOUNTER — Ambulatory Visit: Payer: Self-pay | Admitting: Family Medicine

## 2016-02-02 ENCOUNTER — Ambulatory Visit: Payer: Self-pay | Admitting: Family Medicine

## 2016-02-20 ENCOUNTER — Ambulatory Visit: Payer: Self-pay | Admitting: Family Medicine

## 2016-03-11 ENCOUNTER — Ambulatory Visit (INDEPENDENT_AMBULATORY_CARE_PROVIDER_SITE_OTHER): Payer: Self-pay | Admitting: Family Medicine

## 2016-03-11 ENCOUNTER — Encounter: Payer: Self-pay | Admitting: Family Medicine

## 2016-03-11 VITALS — BP 166/98 | HR 74 | Temp 98.4°F | Ht 71.0 in | Wt 251.0 lb

## 2016-03-11 DIAGNOSIS — F1210 Cannabis abuse, uncomplicated: Secondary | ICD-10-CM

## 2016-03-11 DIAGNOSIS — Z23 Encounter for immunization: Secondary | ICD-10-CM

## 2016-03-11 DIAGNOSIS — R9431 Abnormal electrocardiogram [ECG] [EKG]: Secondary | ICD-10-CM

## 2016-03-11 DIAGNOSIS — E669 Obesity, unspecified: Secondary | ICD-10-CM

## 2016-03-11 DIAGNOSIS — R81 Glycosuria: Secondary | ICD-10-CM | POA: Insufficient documentation

## 2016-03-11 DIAGNOSIS — F1290 Cannabis use, unspecified, uncomplicated: Secondary | ICD-10-CM

## 2016-03-11 DIAGNOSIS — I10 Essential (primary) hypertension: Secondary | ICD-10-CM | POA: Insufficient documentation

## 2016-03-11 LAB — POCT URINALYSIS DIP (DEVICE)
Bilirubin Urine: NEGATIVE
Glucose, UA: 500 mg/dL — AB
Ketones, ur: NEGATIVE mg/dL
Leukocytes, UA: NEGATIVE
Nitrite: NEGATIVE
Protein, ur: 30 mg/dL — AB
Specific Gravity, Urine: 1.025 (ref 1.005–1.030)
Urobilinogen, UA: 0.2 mg/dL (ref 0.0–1.0)
pH: 5.5 (ref 5.0–8.0)

## 2016-03-11 LAB — CBC WITH DIFFERENTIAL/PLATELET
Basophils Absolute: 0 cells/uL (ref 0–200)
Basophils Relative: 0 %
Eosinophils Absolute: 45 cells/uL (ref 15–500)
Eosinophils Relative: 1 %
HCT: 41.1 % (ref 38.5–50.0)
Hemoglobin: 13.6 g/dL (ref 13.2–17.1)
Lymphocytes Relative: 30 %
Lymphs Abs: 1350 cells/uL (ref 850–3900)
MCH: 29.1 pg (ref 27.0–33.0)
MCHC: 33.1 g/dL (ref 32.0–36.0)
MCV: 87.8 fL (ref 80.0–100.0)
MPV: 8.6 fL (ref 7.5–12.5)
Monocytes Absolute: 360 cells/uL (ref 200–950)
Monocytes Relative: 8 %
Neutro Abs: 2745 cells/uL (ref 1500–7800)
Neutrophils Relative %: 61 %
Platelets: 338 10*3/uL (ref 140–400)
RBC: 4.68 MIL/uL (ref 4.20–5.80)
RDW: 15.1 % — ABNORMAL HIGH (ref 11.0–15.0)
WBC: 4.5 10*3/uL (ref 3.8–10.8)

## 2016-03-11 LAB — COMPLETE METABOLIC PANEL WITH GFR
ALT: 10 U/L (ref 9–46)
AST: 13 U/L (ref 10–35)
Albumin: 3.8 g/dL (ref 3.6–5.1)
Alkaline Phosphatase: 74 U/L (ref 40–115)
BUN: 20 mg/dL (ref 7–25)
CO2: 27 mmol/L (ref 20–31)
Calcium: 9 mg/dL (ref 8.6–10.3)
Chloride: 103 mmol/L (ref 98–110)
Creat: 1.23 mg/dL (ref 0.70–1.33)
GFR, Est African American: 78 mL/min (ref >=60)
GFR, Est Non African American: 67 mL/min (ref >=60)
Glucose, Bld: 96 mg/dL (ref 65–99)
Potassium: 3.6 mmol/L (ref 3.5–5.3)
Sodium: 139 mmol/L (ref 135–146)
Total Bilirubin: 0.4 mg/dL (ref 0.2–1.2)
Total Protein: 6.7 g/dL (ref 6.1–8.1)

## 2016-03-11 LAB — TSH: TSH: 0.63 mIU/L (ref 0.40–4.50)

## 2016-03-11 LAB — HEMOGLOBIN A1C
Hgb A1c MFr Bld: 5.7 % — ABNORMAL HIGH (ref <5.7)
Mean Plasma Glucose: 117 mg/dL

## 2016-03-11 MED ORDER — AMLODIPINE BESYLATE 5 MG PO TABS
5.0000 mg | ORAL_TABLET | Freq: Every day | ORAL | Status: DC
Start: 2016-03-11 — End: 2018-09-27

## 2016-03-11 MED ORDER — ASPIRIN EC 81 MG PO TBEC: 81.0000 mg | DELAYED_RELEASE_TABLET | Freq: Every day | ORAL | Status: DC

## 2016-03-11 MED FILL — ?AMLODIPINE BESYLATE 5 MG T: 5 | 30 days supply | Qty: 30 | Fill #0

## 2016-03-11 NOTE — Patient Instructions (Signed)
Hypertension Hypertension, commonly called high blood pressure, is when the force of blood pumping through your arteries is too strong. Your arteries are the blood vessels that carry blood from your heart throughout your body. A blood pressure reading consists of a higher number over a lower number, such as 110/72. The higher number (systolic) is the pressure inside your arteries when your heart pumps. The lower number (diastolic) is the pressure inside your arteries when your heart relaxes. Ideally you want your blood pressure below 120/80. Hypertension forces your heart to work harder to pump blood. Your arteries may become narrow or stiff. Having untreated or uncontrolled hypertension can cause heart attack, stroke, kidney disease, and other problems. RISK FACTORS Some risk factors for high blood pressure are controllable. Others are not.  Risk factors you cannot control include:   Race. You may be at higher risk if you are African American.  Age. Risk increases with age.  Gender. Men are at higher risk than women before age 45 years. After age 65, women are at higher risk than men. Risk factors you can control include:  Not getting enough exercise or physical activity.  Being overweight.  Getting too much fat, sugar, calories, or salt in your diet.  Drinking too much alcohol. SIGNS AND SYMPTOMS Hypertension does not usually cause signs or symptoms. Extremely high blood pressure (hypertensive crisis) may cause headache, anxiety, shortness of breath, and nosebleed. DIAGNOSIS To check if you have hypertension, your health care provider will measure your blood pressure while you are seated, with your arm held at the level of your heart. It should be measured at least twice using the same arm. Certain conditions can cause a difference in blood pressure between your right and left arms. A blood pressure reading that is higher than normal on one occasion does not mean that you need treatment. If  it is not clear whether you have high blood pressure, you may be asked to return on a different day to have your blood pressure checked again. Or, you may be asked to monitor your blood pressure at home for 1 or more weeks. TREATMENT Treating high blood pressure includes making lifestyle changes and possibly taking medicine. Living a healthy lifestyle can help lower high blood pressure. You may need to change some of your habits. Lifestyle changes may include:  Following the DASH diet. This diet is high in fruits, vegetables, and whole grains. It is low in salt, red meat, and added sugars.  Keep your sodium intake below 2,300 mg per day.  Getting at least 30-45 minutes of aerobic exercise at least 4 times per week.  Losing weight if necessary.  Not smoking.  Limiting alcoholic beverages.  Learning ways to reduce stress. Your health care provider may prescribe medicine if lifestyle changes are not enough to get your blood pressure under control, and if one of the following is true:  You are 18-59 years of age and your systolic blood pressure is above 140.  You are 60 years of age or older, and your systolic blood pressure is above 150.  Your diastolic blood pressure is above 90.  You have diabetes, and your systolic blood pressure is over 140 or your diastolic blood pressure is over 90.  You have kidney disease and your blood pressure is above 140/90.  You have heart disease and your blood pressure is above 140/90. Your personal target blood pressure may vary depending on your medical conditions, your age, and other factors. HOME CARE INSTRUCTIONS    Have your blood pressure rechecked as directed by your health care provider.   Take medicines only as directed by your health care provider. Follow the directions carefully. Blood pressure medicines must be taken as prescribed. The medicine does not work as well when you skip doses. Skipping doses also puts you at risk for  problems.  Do not smoke.   Monitor your blood pressure at home as directed by your health care provider. SEEK MEDICAL CARE IF:   You think you are having a reaction to medicines taken.  You have recurrent headaches or feel dizzy.  You have swelling in your ankles.  You have trouble with your vision. SEEK IMMEDIATE MEDICAL CARE IF:  You develop a severe headache or confusion.  You have unusual weakness, numbness, or feel faint.  You have severe chest or abdominal pain.  You vomit repeatedly.  You have trouble breathing. MAKE SURE YOU:   Understand these instructions.  Will watch your condition.  Will get help right away if you are not doing well or get worse.   This information is not intended to replace advice given to you by your health care provider. Make sure you discuss any questions you have with your health care provider.   Document Released: 10/21/2005 Document Revised: 03/07/2015 Document Reviewed: 08/13/2013 Elsevier Interactive Patient Education 2016 Elsevier Inc. DASH Eating Plan DASH stands for "Dietary Approaches to Stop Hypertension." The DASH eating plan is a healthy eating plan that has been shown to reduce high blood pressure (hypertension). Additional health benefits may include reducing the risk of type 2 diabetes mellitus, heart disease, and stroke. The DASH eating plan may also help with weight loss. WHAT DO I NEED TO KNOW ABOUT THE DASH EATING PLAN? For the DASH eating plan, you will follow these general guidelines:  Choose foods with a percent daily value for sodium of less than 5% (as listed on the food label).  Use salt-free seasonings or herbs instead of table salt or sea salt.  Check with your health care provider or pharmacist before using salt substitutes.  Eat lower-sodium products, often labeled as "lower sodium" or "no salt added."  Eat fresh foods.  Eat more vegetables, fruits, and low-fat dairy products.  Choose whole grains.  Look for the word "whole" as the first word in the ingredient list.  Choose fish and skinless chicken or turkey more often than red meat. Limit fish, poultry, and meat to 6 oz (170 g) each day.  Limit sweets, desserts, sugars, and sugary drinks.  Choose heart-healthy fats.  Limit cheese to 1 oz (28 g) per day.  Eat more home-cooked food and less restaurant, buffet, and fast food.  Limit fried foods.  Cook foods using methods other than frying.  Limit canned vegetables. If you do use them, rinse them well to decrease the sodium.  When eating at a restaurant, ask that your food be prepared with less salt, or no salt if possible. WHAT FOODS CAN I EAT? Seek help from a dietitian for individual calorie needs. Grains Whole grain or whole wheat bread. Brown rice. Whole grain or whole wheat pasta. Quinoa, bulgur, and whole grain cereals. Low-sodium cereals. Corn or whole wheat flour tortillas. Whole grain cornbread. Whole grain crackers. Low-sodium crackers. Vegetables Fresh or frozen vegetables (raw, steamed, roasted, or grilled). Low-sodium or reduced-sodium tomato and vegetable juices. Low-sodium or reduced-sodium tomato sauce and paste. Low-sodium or reduced-sodium canned vegetables.  Fruits All fresh, canned (in natural juice), or frozen fruits. Meat and Other   Protein Products Ground beef (85% or leaner), grass-fed beef, or beef trimmed of fat. Skinless chicken or turkey. Ground chicken or turkey. Pork trimmed of fat. All fish and seafood. Eggs. Dried beans, peas, or lentils. Unsalted nuts and seeds. Unsalted canned beans. Dairy Low-fat dairy products, such as skim or 1% milk, 2% or reduced-fat cheeses, low-fat ricotta or cottage cheese, or plain low-fat yogurt. Low-sodium or reduced-sodium cheeses. Fats and Oils Tub margarines without trans fats. Light or reduced-fat mayonnaise and salad dressings (reduced sodium). Avocado. Safflower, olive, or canola oils. Natural peanut or almond  butter. Other Unsalted popcorn and pretzels. The items listed above may not be a complete list of recommended foods or beverages. Contact your dietitian for more options. WHAT FOODS ARE NOT RECOMMENDED? Grains White bread. White pasta. White rice. Refined cornbread. Bagels and croissants. Crackers that contain trans fat. Vegetables Creamed or fried vegetables. Vegetables in a cheese sauce. Regular canned vegetables. Regular canned tomato sauce and paste. Regular tomato and vegetable juices. Fruits Dried fruits. Canned fruit in light or heavy syrup. Fruit juice. Meat and Other Protein Products Fatty cuts of meat. Ribs, chicken wings, bacon, sausage, bologna, salami, chitterlings, fatback, hot dogs, bratwurst, and packaged luncheon meats. Salted nuts and seeds. Canned beans with salt. Dairy Whole or 2% milk, cream, half-and-half, and cream cheese. Whole-fat or sweetened yogurt. Full-fat cheeses or blue cheese. Nondairy creamers and whipped toppings. Processed cheese, cheese spreads, or cheese curds. Condiments Onion and garlic salt, seasoned salt, table salt, and sea salt. Canned and packaged gravies. Worcestershire sauce. Tartar sauce. Barbecue sauce. Teriyaki sauce. Soy sauce, including reduced sodium. Steak sauce. Fish sauce. Oyster sauce. Cocktail sauce. Horseradish. Ketchup and mustard. Meat flavorings and tenderizers. Bouillon cubes. Hot sauce. Tabasco sauce. Marinades. Taco seasonings. Relishes. Fats and Oils Butter, stick margarine, lard, shortening, ghee, and bacon fat. Coconut, palm kernel, or palm oils. Regular salad dressings. Other Pickles and olives. Salted popcorn and pretzels. The items listed above may not be a complete list of foods and beverages to avoid. Contact your dietitian for more information. WHERE CAN I FIND MORE INFORMATION? National Heart, Lung, and Blood Institute: www.nhlbi.nih.gov/health/health-topics/topics/dash/   This information is not intended to replace  advice given to you by your health care provider. Make sure you discuss any questions you have with your health care provider.   Document Released: 10/10/2011 Document Revised: 11/11/2014 Document Reviewed: 08/25/2013 Elsevier Interactive Patient Education 2016 Elsevier Inc.  

## 2016-03-11 NOTE — Progress Notes (Signed)
Subjective:    Patient ID: Dale [name], male    DOB: April 10, 1961, 55 y.o.   MRN: [id number]  HPI Mr. Joffre Cabreros, a 55 year old male presents to establish care. He states that he was a patient of a clinic in [facility]. He states that he works in [place] and has transitioned care. He states that he was previously on medication for hypertension, but has been without medication over the past several years.He is not exercising and is not adherent to low salt diet.  Patient does not check blood pressure at home. Patient denies chest pain, dyspnea, fatigue, irregular heart beat, orthopnea, palpitations, syncope and tachypnea.  Cardiovascular risk factors include: male gender, obesity (BMI >= 30 kg/m2), sedentary lifestyle and smoking/ tobacco exposure.   Past Medical History  Diagnosis Date  . Hypertension    Social History   Social History  . Marital Status: Single    Spouse Name: N/A  . Number of Children: N/A  . Years of Education: N/A   Occupational History  . Not on file.   Social History Main Topics  . Smoking status: Never Smoker   . Smokeless tobacco: Not on file  . Alcohol Use: Yes     Comment: 2 quarts a week   . Drug Use: Yes    Special: Marijuana     Comment: everyday   . Sexual Activity: Not on file   Other Topics Concern  . Not on file   Social History Narrative   Allergies  Allergen Reactions  . Hydrocodone Itching   Depression screen PHQ 2/9 03/11/2016  Decreased Interest 0  Down, Depressed, Hopeless 0  PHQ - 2 Score 0    Review of Systems  Constitutional: Negative.  Negative for fever and fatigue.  HENT: Negative.   Eyes: Negative.   Respiratory: Negative.   Cardiovascular: Negative.   Gastrointestinal: Negative.   Endocrine: Positive for polyuria. Negative for polydipsia and polyphagia.  Genitourinary: Negative.   Musculoskeletal: Negative.   Skin: Negative.   Allergic/Immunologic: Negative for immunocompromised state.  Neurological:  Negative.  Negative for dizziness and headaches.  Hematological: Negative.   Psychiatric/Behavioral: Negative.        Objective:   Physical Exam  Constitutional: He is oriented to person, place, and time. He appears well-developed and well-nourished.  HENT:  Head: Normocephalic and atraumatic.  Right Ear: Hearing, tympanic membrane, external ear and ear canal normal.  Left Ear: Hearing, tympanic membrane, external ear and ear canal normal.  Nose: Nose normal.  Mouth/Throat: Uvula is midline and oropharynx is clear and moist.  Eyes: Conjunctivae, EOM and lids are normal. Pupils are equal, round, and reactive to light.  Neck: Normal range of motion. Neck supple.  Cardiovascular: Normal rate, regular rhythm, normal heart sounds and intact distal pulses.   Pulmonary/Chest: Effort normal and breath sounds normal.  Abdominal: Soft. Bowel sounds are normal. There is no tenderness.  Abdominal obesity  Musculoskeletal: Normal range of motion.  Neurological: He is alert and oriented to person, place, and time. He has normal reflexes.  Skin: Skin is warm and dry.  Psychiatric: He has a normal mood and affect. His behavior is normal. Judgment and thought content normal.       BP 166/98 mmHg  Pulse 74  Temp(Src) 98.4 F (36.9 C) (Oral)  Ht 5\' 11"  (1.803 m)  Wt 251 lb (113.853 kg)  BMI 35.02 kg/m2 Assessment & Plan:  1. Essential hypertension Blood pressure is above goal on current medication regimen.  Will add amlodipine to medication regimen. Patient to return in 1 month for a blood pressure check.  - COMPLETE METABOLIC PANEL WITH GFR - CBC with Differential - POCT urinalysis dipstick - amLODipine (NORVASC) 5 MG tablet; Take 1 tablet (5 mg total) by mouth daily.  Dispense: 30 tablet; Refill: 1 - EKG 12-Lead  2. Obesity Recommend a lowfat, low carbohydrate diet divided over 5-6 small meals, increase water intake to 6-8 glasses, and 150 minutes per week of cardiovascular exercise.   -  Hemoglobin A1c - TSH - Lipid Panel; Future  3. Marijuana use Discussed marijuana use at length, recommend that he discontinue daily use. He expressed understanding. He is not ready to quit at this juncture.    4. Immunization due - Pneumococcal polysaccharide vaccine 23-valent greater than or equal to 2yo subcutaneous/IM  5. Abnormal EKG  Will re-start antihypertensive medication (s). Will re-check EKG in 6 months and compare to baseline.   6. Glucosuria   Abnormal EKG, 500 glucose in urine. Will check hemoglobin a1C    Routine Health Maintenance:  Previous tetanus-2013.  Recommend digital rectal exam with occult blood.  Will follow up by phone with laboratory    RTC: 1 month for hypertension [name], FNP

## 2016-03-18 ENCOUNTER — Other Ambulatory Visit: Payer: Self-pay

## 2016-04-11 ENCOUNTER — Ambulatory Visit: Payer: Self-pay | Admitting: Family Medicine

## 2018-02-25 ENCOUNTER — Emergency Department (HOSPITAL_COMMUNITY)
Admission: EM | Admit: 2018-02-25 | Discharge: 2018-02-25 | Disposition: A | Discharge status: Home / Self Care | Payer: Self-pay

## 2018-02-25 ENCOUNTER — Encounter (HOSPITAL_COMMUNITY): Payer: Self-pay | Admitting: Emergency Medicine

## 2018-02-25 NOTE — ED Notes (Signed)
No answer for triage x2 

## 2018-02-25 NOTE — ED Notes (Signed)
No answer for triage x1 

## 2018-02-25 NOTE — ED Notes (Signed)
Patient up to desk.  States he needs to go to work and will return tomorrow.  Return precautions reviewed.

## 2018-02-25 NOTE — ED Triage Notes (Signed)
Pt woke up Sunday with bite to L inner eye. Pt believes it was a spider but is unsure. Pt states worsening swelling and pain over the past few day. Pt reports purulent drainage after popping it yesterday.

## 2018-09-27 ENCOUNTER — Other Ambulatory Visit: Payer: Self-pay

## 2018-09-27 ENCOUNTER — Telehealth: Payer: Self-pay | Admitting: Physician Assistant

## 2018-09-27 ENCOUNTER — Encounter (HOSPITAL_COMMUNITY): Payer: Self-pay | Admitting: Emergency Medicine

## 2018-09-27 ENCOUNTER — Inpatient Hospital Stay (HOSPITAL_COMMUNITY)
Admission: EM | Admit: 2018-09-27 | Discharge: 2018-09-29 | DRG: 291 | Discharge status: Against Medical Advice | Payer: Medicaid Other | Attending: Internal Medicine | Admitting: Internal Medicine

## 2018-09-27 ENCOUNTER — Emergency Department (HOSPITAL_COMMUNITY): Payer: Medicaid Other

## 2018-09-27 ENCOUNTER — Inpatient Hospital Stay (HOSPITAL_COMMUNITY): Payer: Medicaid Other

## 2018-09-27 DIAGNOSIS — F1910 Other psychoactive substance abuse, uncomplicated: Secondary | ICD-10-CM

## 2018-09-27 DIAGNOSIS — Z6832 Body mass index (BMI) 32.0-32.9, adult: Secondary | ICD-10-CM

## 2018-09-27 DIAGNOSIS — E669 Obesity, unspecified: Secondary | ICD-10-CM | POA: Diagnosis present

## 2018-09-27 DIAGNOSIS — I5043 Acute on chronic combined systolic (congestive) and diastolic (congestive) heart failure: Secondary | ICD-10-CM | POA: Diagnosis present

## 2018-09-27 DIAGNOSIS — I10 Essential (primary) hypertension: Secondary | ICD-10-CM | POA: Diagnosis present

## 2018-09-27 DIAGNOSIS — I130 Hypertensive heart and chronic kidney disease with heart failure and stage 1 through stage 4 chronic kidney disease, or unspecified chronic kidney disease: Principal | ICD-10-CM | POA: Diagnosis present

## 2018-09-27 DIAGNOSIS — N182 Chronic kidney disease, stage 2 (mild): Secondary | ICD-10-CM | POA: Diagnosis present

## 2018-09-27 DIAGNOSIS — D649 Anemia, unspecified: Secondary | ICD-10-CM

## 2018-09-27 DIAGNOSIS — Z885 Allergy status to narcotic agent status: Secondary | ICD-10-CM

## 2018-09-27 DIAGNOSIS — Z760 Encounter for issue of repeat prescription: Secondary | ICD-10-CM | POA: Diagnosis present

## 2018-09-27 DIAGNOSIS — F1410 Cocaine abuse, uncomplicated: Secondary | ICD-10-CM | POA: Diagnosis present

## 2018-09-27 DIAGNOSIS — I5021 Acute systolic (congestive) heart failure: Secondary | ICD-10-CM | POA: Insufficient documentation

## 2018-09-27 DIAGNOSIS — J810 Acute pulmonary edema: Secondary | ICD-10-CM

## 2018-09-27 DIAGNOSIS — I472 Ventricular tachycardia: Secondary | ICD-10-CM | POA: Diagnosis not present

## 2018-09-27 DIAGNOSIS — I161 Hypertensive emergency: Secondary | ICD-10-CM | POA: Diagnosis present

## 2018-09-27 DIAGNOSIS — D631 Anemia in chronic kidney disease: Secondary | ICD-10-CM | POA: Diagnosis present

## 2018-09-27 DIAGNOSIS — I160 Hypertensive urgency: Secondary | ICD-10-CM

## 2018-09-27 DIAGNOSIS — I509 Heart failure, unspecified: Secondary | ICD-10-CM

## 2018-09-27 DIAGNOSIS — Z5321 Procedure and treatment not carried out due to patient leaving prior to being seen by health care provider: Secondary | ICD-10-CM | POA: Diagnosis not present

## 2018-09-27 HISTORY — DX: Alcohol abuse, uncomplicated: F10.10

## 2018-09-27 HISTORY — DX: Cocaine abuse, uncomplicated: F14.10

## 2018-09-27 HISTORY — DX: Chronic combined systolic (congestive) and diastolic (congestive) heart failure: I50.42

## 2018-09-27 LAB — CBC WITH DIFFERENTIAL/PLATELET
Abs Immature Granulocytes: 0.03 10*3/uL (ref 0.00–0.07)
Basophils Absolute: 0.1 10*3/uL (ref 0.0–0.1)
Basophils Relative: 1 %
Eosinophils Absolute: 0.2 10*3/uL (ref 0.0–0.5)
Eosinophils Relative: 3 %
HCT: 37.5 % — ABNORMAL LOW (ref 39.0–52.0)
Hemoglobin: 11.6 g/dL — ABNORMAL LOW (ref 13.0–17.0)
Immature Granulocytes: 0 %
Lymphocytes Relative: 19 %
Lymphs Abs: 1.3 10*3/uL (ref 0.7–4.0)
MCH: 29.3 pg (ref 26.0–34.0)
MCHC: 30.9 g/dL (ref 30.0–36.0)
MCV: 94.7 fL (ref 80.0–100.0)
Monocytes Absolute: 0.5 10*3/uL (ref 0.1–1.0)
Monocytes Relative: 7 %
Neutro Abs: 4.9 10*3/uL (ref 1.7–7.7)
Neutrophils Relative %: 70 %
Platelets: 293 10*3/uL (ref 150–400)
RBC: 3.96 MIL/uL — ABNORMAL LOW (ref 4.22–5.81)
RDW: 14.4 % (ref 11.5–15.5)
WBC: 7 10*3/uL (ref 4.0–10.5)
nRBC: 0 % (ref 0.0–0.2)

## 2018-09-27 LAB — RAPID URINE DRUG SCREEN, HOSP PERFORMED
Amphetamines: NOT DETECTED
Barbiturates: NOT DETECTED
Benzodiazepines: NOT DETECTED
Cocaine: POSITIVE — AB
Opiates: NOT DETECTED
Tetrahydrocannabinol: NOT DETECTED

## 2018-09-27 LAB — LIPID PANEL
Cholesterol: 161 mg/dL (ref 0–200)
HDL: 55 mg/dL (ref >40)
LDL Cholesterol: 101 mg/dL — ABNORMAL HIGH (ref 0–99)
Total CHOL/HDL Ratio: 2.9 RATIO
Triglycerides: 25 mg/dL (ref <150)
VLDL: 5 mg/dL (ref 0–40)

## 2018-09-27 LAB — BRAIN NATRIURETIC PEPTIDE: B Natriuretic Peptide: 1531.1 pg/mL — ABNORMAL HIGH (ref 0.0–100.0)

## 2018-09-27 LAB — ECHOCARDIOGRAM COMPLETE
Height: 71 in
Weight: 3774.28 oz

## 2018-09-27 LAB — BASIC METABOLIC PANEL
Anion gap: 7 (ref 5–15)
BUN: 26 mg/dL — ABNORMAL HIGH (ref 6–20)
CO2: 24 mmol/L (ref 22–32)
Calcium: 8.5 mg/dL — ABNORMAL LOW (ref 8.9–10.3)
Chloride: 107 mmol/L (ref 98–111)
Creatinine, Ser: 1.19 mg/dL (ref 0.61–1.24)
GFR calc Af Amer: >60 mL/min (ref >60)
GFR calc non Af Amer: >60 mL/min (ref >60)
Glucose, Bld: 102 mg/dL — ABNORMAL HIGH (ref 70–99)
Potassium: 3.7 mmol/L (ref 3.5–5.1)
Sodium: 138 mmol/L (ref 135–145)

## 2018-09-27 LAB — I-STAT TROPONIN, ED: Troponin i, poc: 0.05 ng/mL (ref 0.00–0.08)

## 2018-09-27 LAB — TSH: TSH: 1.265 u[IU]/mL (ref 0.350–4.500)

## 2018-09-27 MED ORDER — CARVEDILOL 3.125 MG PO TABS
3.1250 mg | ORAL_TABLET | Freq: Two times a day (BID) | ORAL | Status: DC
  Administered 2018-09-27 – 2018-09-28 (×2): 3.125 mg via ORAL
  Filled 2018-09-27 (×2): qty 1

## 2018-09-27 MED ORDER — FUROSEMIDE 10 MG/ML IJ SOLN
40.0000 mg | Freq: Once | INTRAMUSCULAR | Status: AC
  Administered 2018-09-27: 40 mg via INTRAVENOUS
  Filled 2018-09-27: qty 4

## 2018-09-27 MED ORDER — HYDRALAZINE HCL 20 MG/ML IJ SOLN
10.0000 mg | Freq: Four times a day (QID) | INTRAMUSCULAR | Status: DC | PRN
  Administered 2018-09-27: 10 mg via INTRAVENOUS
  Filled 2018-09-27: qty 1

## 2018-09-27 MED ORDER — AMLODIPINE BESYLATE 10 MG PO TABS
10.0000 mg | ORAL_TABLET | Freq: Every day | ORAL | Status: DC
  Administered 2018-09-27: 10 mg via ORAL
  Filled 2018-09-27: qty 1

## 2018-09-27 MED ORDER — FUROSEMIDE 10 MG/ML IJ SOLN
20.0000 mg | Freq: Once | INTRAMUSCULAR | Status: AC
  Administered 2018-09-27: 20 mg via INTRAVENOUS
  Filled 2018-09-27: qty 4

## 2018-09-27 MED ORDER — NITROGLYCERIN 2 % TD OINT
1.0000 [in_us] | TOPICAL_OINTMENT | Freq: Once | TRANSDERMAL | Status: AC
  Administered 2018-09-27: 1 [in_us] via TOPICAL
  Filled 2018-09-27: qty 1

## 2018-09-27 MED ORDER — LOSARTAN POTASSIUM 25 MG PO TABS
25.0000 mg | ORAL_TABLET | Freq: Every day | ORAL | Status: DC
  Administered 2018-09-27: 25 mg via ORAL
  Filled 2018-09-27: qty 1

## 2018-09-27 MED ORDER — AMLODIPINE BESYLATE 5 MG PO TABS
5.0000 mg | ORAL_TABLET | Freq: Once | ORAL | Status: AC
  Administered 2018-09-27: 5 mg via ORAL
  Filled 2018-09-27: qty 1

## 2018-09-27 MED ORDER — FUROSEMIDE 10 MG/ML IJ SOLN
40.0000 mg | Freq: Every day | INTRAMUSCULAR | Status: DC
  Administered 2018-09-27: 40 mg via INTRAVENOUS
  Filled 2018-09-27 (×2): qty 4

## 2018-09-27 MED ORDER — ENOXAPARIN SODIUM 40 MG/0.4ML ~~LOC~~ SOLN
40.0000 mg | SUBCUTANEOUS | Status: DC
  Administered 2018-09-27 – 2018-09-28 (×2): 40 mg via SUBCUTANEOUS
  Filled 2018-09-27 (×2): qty 0.4

## 2018-09-27 NOTE — Progress Notes (Signed)
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 Dr Rennis Golden, cardiology, paged for consult.

## 2018-09-27 NOTE — ED Provider Notes (Signed)
[facility]-EMERGENCY DEPT Provider Note   CSN: [id number] Arrival date & time: 09/27/18  0536     History   Chief Complaint Chief Complaint  Patient presents with  . Shortness of Breath    HPI Dale [name] is a 57 y.o. male.  HPI  This is a 57 year old male who presents with shortness of breath.  Patient reports that he has had increasing shortness of breath over the last 10 days.  Specifically he reports orthopnea.  No history of heart failure.  Does report a history of high blood pressure but is not taking any medications at this time.  Patient denies any fever.  He does report a nonproductive cough.  No chest pain, abdominal pain, nausea, vomiting.  He reports waxing and waning leg swelling.  She has noted some occasional hand swelling.  Denies asymmetric leg swelling or history of blood clots.  Past Medical History:  Diagnosis Date  . Hypertension     Patient Active Problem List   Diagnosis Date Noted  . Essential hypertension 03/11/2016  . Obesity 03/11/2016  . Glucosuria 03/11/2016    History reviewed. No pertinent surgical history.      Home Medications    Prior to Admission medications   Medication Sig Start Date End Date Taking? Authorizing Provider  amLODipine (NORVASC) 5 MG tablet Take 1 tablet (5 mg total) by mouth daily. 03/11/16   [name], FNP  aspirin EC 81 MG tablet Take 1 tablet (81 mg total) by mouth daily. 03/11/16   [name], FNP    Family History History reviewed. No pertinent family history.  Social History Social History   Tobacco Use  . Smoking status: Never Smoker  . Smokeless tobacco: Never Used  Substance Use Topics  . Alcohol use: Yes    Comment: 2 quarts a week   . Drug use: Yes    Frequency: 3.0 times per week    Types: Marijuana     Allergies   Hydrocodone   Review of Systems Review of Systems  Constitutional: Negative for chills and fever.  Respiratory: Positive for cough and  shortness of breath.   Cardiovascular: Positive for leg swelling. Negative for chest pain.  Gastrointestinal: Negative for abdominal pain, nausea and vomiting.  Genitourinary: Positive for dysuria.     Physical Exam Updated Vital Signs BP (!) 176/127 (BP Location: Left Arm)   Pulse 88   Temp 97.7 F (36.5 C) (Oral)   Resp 17   Ht 1.803 m (5\' 11" )   Wt 104.3 kg   SpO2 93%   BMI 32.08 kg/m   Physical Exam  Constitutional: He is oriented to person, place, and time. He appears well-developed and well-nourished.  Awake, nontoxic, no acute distress  HENT:  Head: Normocephalic and atraumatic.  Eyes: Pupils are equal, round, and reactive to light.  Neck: Neck supple.  Cardiovascular: Normal rate, regular rhythm and normal heart sounds.  No murmur heard. Pulmonary/Chest: Effort normal. No respiratory distress. He has no wheezes. He has rales.  Rales with fine crackles bilateral bases  Abdominal: Soft. Bowel sounds are normal. There is no tenderness. There is no rebound.  Musculoskeletal: He exhibits no edema.  Lymphadenopathy:    He has no cervical adenopathy.  Neurological: He is alert and oriented to person, place, and time.  Skin: Skin is warm and dry.  Psychiatric: He has a normal mood and affect.  Nursing note and vitals reviewed.    ED Treatments / Results  Labs (all labs ordered are listed, but only abnormal results are displayed) Labs Reviewed  BASIC METABOLIC PANEL - Abnormal; Notable for the following components:      Result Value   Glucose, Bld 102 (*)    BUN 26 (*)    Calcium 8.5 (*)    All other components within normal limits  CBC WITH DIFFERENTIAL/PLATELET  BRAIN NATRIURETIC PEPTIDE  I-STAT TROPONIN, ED    EKG EKG Interpretation  Date/Time:  Sunday September 27 2018 06:17:32 EST Ventricular Rate:  87 PR Interval:    QRS Duration: 99 QT Interval:  406 QTC Calculation: 489 R Axis:   35 Text Interpretation:  Sinus rhythm Borderline prolonged PR  interval LAE, consider biatrial enlargement LVH with secondary repolarization abnormality Borderline prolonged QT interval Confirmed by Ross Marcus ([id number]) on 09/27/2018 6:28:17 AM   Radiology Dg Chest 2 View  Result Date: 09/27/2018 CLINICAL DATA:  Shortness of breath for 10 days. EXAM: CHEST - 2 VIEW COMPARISON:  None. FINDINGS: Cardiac enlargement. Pulmonary vascular congestion. Mild perihilar interstitial changes suggesting early edema. No pleural effusions. No pneumothorax. No focal consolidation. Mediastinal contours appear intact. IMPRESSION: Cardiac enlargement with pulmonary vascular congestion and mild perihilar edema. Electronically Signed   By: Burman Nieves M.D.   On: 09/27/2018 06:08    Procedures Procedures (including critical care time)  CRITICAL CARE Performed by: Shon Baton   Total critical care time: 35 minutes  Critical care time was exclusive of separately billable procedures and treating other patients.  Critical care was necessary to treat or prevent imminent or life-threatening deterioration.  Critical care was time spent personally by me on the following activities: development of treatment plan with patient and/or surrogate as well as nursing, discussions with consultants, evaluation of patient's response to treatment, examination of patient, obtaining history from patient or surrogate, ordering and performing treatments and interventions, ordering and review of laboratory studies, ordering and review of radiographic studies, pulse oximetry and re-evaluation of patient's condition.   Medications Ordered in ED Medications  amLODipine (NORVASC) tablet 5 mg (has no administration in time range)  furosemide (LASIX) injection 20 mg (has no administration in time range)  nitroGLYCERIN (NITROGLYN) 2 % ointment 1 inch (has no administration in time range)     Initial Impression / Assessment and Plan / ED Course  I have reviewed the triage vital  signs and the nursing notes.  Pertinent labs & imaging results that were available during my care of the patient were reviewed by me and considered in my medical decision making (see chart for details).     Patient presents with 10-day history of shortness of breath.  Reports orthopnea.  He is in no acute distress.  Vital signs notable for blood pressure of 176/127.  He has not been taking his blood pressure medications.  He is in no respiratory distress and O2 sats are in the mid 90s.  Fine crackles in the bases of his lungs.  No overt peripheral edema.  Work-up initiated.  EKG with evidence of LVH but no evidence of ischemia or arrhythmia.  Chest x-ray shows likely early pulmonary edema.  He also has cardiomegaly.  No known history of CHF.  High suspicion that this may be early onset CHF versus hypertensive urgency.  Patient was given a dose of amlodipine.  Lab work-up is otherwise reassuring.  Troponin is negative.  Patient was given 20 mg of IV Lasix.  Patient reports he still feels short of breath on reexamination.  O2  sats 93%.  He does not have a primary physician or cardiologist.  Will admit for diuresis and echocardiogram.  Discussed with admitting hospitalist.  Final Clinical Impressions(s) / ED Diagnoses   Final diagnoses:  Acute pulmonary edema [facility])  Essential hypertension    ED Discharge Orders    None       Shon Baton, MD 09/27/18 [phone]

## 2018-09-27 NOTE — ED Triage Notes (Signed)
Pt presents by [facility] for shortness of breath that has been ongoing for the last 10 days. Pt speaking in clear sentences and reports that shortness of breath is worse when laying flat. Pt has hx of HTN but not currently taking medications.

## 2018-09-27 NOTE — ED Notes (Signed)
ED TO INPATIENT HANDOFF REPORT  Name/Age/Gender Dale [name] 57 y.o. male  Code Status    Code Status Orders  (From admission, onward)         Start     Ordered   09/27/18 0827  Full code  Continuous     09/27/18 0826        Code Status History    This patient has a current code status but no historical code status.      Home/SNF/Other Home  Chief Complaint shortness of breath   Level of Care/Admitting Diagnosis ED Disposition    ED Disposition Condition Comment   Admit  Hospital Area: [facility] [[id number]]  Level of Care: Telemetry [5]  Admit to tele based on following criteria: Monitor for Ischemic changes  Diagnosis: New onset of congestive heart failure [facility]) [[id number]]  Admitting Physician: Kayleen Memos [[id number]]  Attending Physician: Kayleen Memos [[id number]]  Estimated length of stay: past midnight tomorrow  Certification:: I certify this patient will need inpatient services for at least 2 midnights  PT Class (Do Not Modify): Inpatient [101]  PT Acc Code (Do Not Modify): Private [1]       Medical History Past Medical History:  Diagnosis Date  . Hypertension     Allergies Allergies  Allergen Reactions  . Hydrocodone Itching    IV Location/Drains/Wounds Patient Lines/Drains/Airways Status   Active Line/Drains/Airways    None          Labs/Imaging Results for orders placed or performed during the hospital encounter of 09/27/18 (from the past 48 hour(s))  CBC with Differential     Status: Abnormal   Collection Time: 09/27/18  6:34 AM  Result Value Ref Range   WBC 7.0 4.0 - 10.5 K/uL   RBC 3.96 (L) 4.22 - 5.81 MIL/uL   Hemoglobin 11.6 (L) 13.0 - 17.0 g/dL   HCT 37.5 (L) 39.0 - 52.0 %   MCV 94.7 80.0 - 100.0 fL   MCH 29.3 26.0 - 34.0 pg   MCHC 30.9 30.0 - 36.0 g/dL   RDW 14.4 11.5 - 15.5 %   Platelets 293 150 - 400 K/uL   nRBC 0.0 0.0 - 0.2 %   Neutrophils Relative % 70 %   Neutro Abs 4.9 1.7 - 7.7 K/uL   Lymphocytes Relative 19 %   Lymphs Abs 1.3 0.7 - 4.0 K/uL   Monocytes Relative 7 %   Monocytes Absolute 0.5 0.1 - 1.0 K/uL   Eosinophils Relative 3 %   Eosinophils Absolute 0.2 0.0 - 0.5 K/uL   Basophils Relative 1 %   Basophils Absolute 0.1 0.0 - 0.1 K/uL   Immature Granulocytes 0 %   Abs Immature Granulocytes 0.03 0.00 - 0.07 K/uL    Comment: Performed at [facility], [place] [address], [place], [place] [address]  Basic metabolic panel     Status: Abnormal   Collection Time: 09/27/18  6:34 AM  Result Value Ref Range   Sodium 138 135 - 145 mmol/L   Potassium 3.7 3.5 - 5.1 mmol/L   Chloride 107 98 - 111 mmol/L   CO2 24 22 - 32 mmol/L   Glucose, Bld 102 (H) 70 - 99 mg/dL   BUN 26 (H) 6 - 20 mg/dL   Creatinine, Ser 1.19 0.61 - 1.24 mg/dL   Calcium 8.5 (L) 8.9 - 10.3 mg/dL   GFR calc non Af Amer >60 >60 mL/min   GFR calc Af Amer >60 >60 mL/min  Comment: (NOTE) The eGFR has been calculated using the CKD EPI equation. This calculation has not been validated in all clinical situations. eGFR's persistently <60 mL/min signify possible Chronic Kidney Disease.    Anion gap 7 5 - 15    Comment: Performed at [facility] [place], [place] [address], [place], [place] [address]  I-Stat Troponin, ED (not at [facility])     Status: None   Collection Time: 09/27/18  6:39 AM  Result Value Ref Range   Troponin i, poc 0.05 0.00 - 0.08 ng/mL   Comment 3            Comment: Due to the release kinetics of cTnI, a negative result within the first hours of the onset of symptoms does not rule out myocardial infarction with certainty. If myocardial infarction is still suspected, repeat the test at appropriate intervals.    Dg Chest 2 View  Result Date: 09/27/2018 CLINICAL DATA:  Shortness of breath for 10 days. EXAM: CHEST - 2 VIEW COMPARISON:  None. FINDINGS: Cardiac enlargement. Pulmonary vascular congestion. Mild perihilar interstitial changes suggesting early edema. No  pleural effusions. No pneumothorax. No focal consolidation. Mediastinal contours appear intact. IMPRESSION: Cardiac enlargement with pulmonary vascular congestion and mild perihilar edema. Electronically Signed   By: Lucienne Capers M.D.   On: 09/27/2018 06:08   EKG Interpretation  Date/Time:  _0 /24/19 0544          Vitals/Pain Today's Vitals   09/27/18 0542 09/27/18 0543 09/27/18 0553 09/27/18 0800  BP: (!) 176/127   (!) 144/108  Pulse: 88   84  Resp: 17   (!) 21  Temp: 97.7 F (36.5 C)     TempSrc: Oral     SpO2: 93%   97%  Weight:  104.3 kg    Height:  5' 11" (1.803 m)    PainSc: 0-No pain  0-No pain     Isolation Precautions No active isolations  Medications Medications  amLODipine (NORVASC) tablet 5 mg (5 mg Oral Given 09/27/18 0754)  furosemide (LASIX) injection 20 mg (20 mg Intravenous Given 09/27/18 0754)  nitroGLYCERIN (NITROGLYN) 2 % ointment 1 inch (1 inch Topical Given 09/27/18 0754)    Mobility walks

## 2018-09-27 NOTE — Consult Note (Addendum)
Cardiology Consultation:   Patient ID: Ed Mandich MRN: [id number]; DOB: October 17, 1961  Admit date: 09/27/2018 Date of Consult: 09/27/2018  Primary Care Provider: [name], FNP Primary Cardiologist: New to [facility]  Patient Profile:   Murle Otting is a 57 y.o. male with a hx of uncontrolled hypertension and polysubstance abuse including cocaine and alcohol who is being seen today for the evaluation of CHF at the request of Dr. Margo Aye.  History of Present Illness:   Mr. Venus presented with few days history of shortness of breath, orthopnea and PND worsening past 3 days.Marland Kitchen  He continues to use cocaine, last used 3 days ago.  He drinks alcohol heavily.  Chest x-ray with evidence of vascular congestion and pulmonary edema.  Point-of-care troponin negative.  BNP 1500.  He was hypertensive on arrival.  He was started on IV Lasix and amlodipine.  Echocardiogram today showed reduced LV function to 25% with grade 2 diastolic dysfunction.  Cardiology is asked for treatment and evaluation.  He diuresed 2.3 L so far. Blood pressure remains elevated.  Renal function and electrolyte normal.  Not on any hypertensive regimen at home due to lack of insurance and PCP.  Patient says both grandparents has history of stroke and high blood pressure runs in his family.    Past Medical History:  Diagnosis Date  . Alcohol abuse   . Cocaine abuse (HCC)   . Hypertension     History reviewed. No pertinent surgical history.   Inpatient Medications: Scheduled Meds: . amLODipine  10 mg Oral Daily  . enoxaparin (LOVENOX) injection  40 mg Subcutaneous Q24H  . furosemide  40 mg Intravenous Daily   Continuous Infusions:  PRN Meds: hydrALAZINE  Allergies:    Allergies  Allergen Reactions  . Hydrocodone Itching    Social History:   Social History   Socioeconomic History  . Marital status: Single    Spouse name: Not on file  . Number of children: Not on file  . Years of education: Not on file    . Highest education level: Not on file  Occupational History  . Not on file  Social Needs  . Financial resource strain: Not on file  . Food insecurity:    Worry: Not on file    Inability: Not on file  . Transportation needs:    Medical: Not on file    Non-medical: Not on file  Tobacco Use  . Smoking status: Never Smoker  . Smokeless tobacco: Never Used  Substance and Sexual Activity  . Alcohol use: Yes    Comment: 2 quarts a week   . Drug use: Yes    Frequency: 3.0 times per week    Types: Marijuana, Cocaine  . Sexual activity: Not on file  Lifestyle  . Physical activity:    Days per week: Not on file    Minutes per session: Not on file  . Stress: Not on file  Relationships  . Social connections:    Talks on phone: Not on file    Gets together: Not on file    Attends religious service: Not on file    Active member of club or organization: Not on file    Attends meetings of clubs or organizations: Not on file    Relationship status: Not on file  . Intimate partner violence:    Fear of current or ex partner: Not on file    Emotionally abused: Not on file    Physically abused: Not on file  Forced sexual activity: Not on file  Other Topics Concern  . Not on file  Social History Narrative  . Not on file    Family History:   **History reviewed. No pertinent family history.  As documented above  ROS:  Please see the history of present illness.  All other ROS reviewed and negative.     Physical Exam/Data:   Vitals:   09/27/18 0543 09/27/18 0800 09/27/18 0903 09/27/18 1413  BP:  (!) 149/106 (!) 163/111 (!) 161/117  Pulse:  87 84   Resp:  (!) 23 18   Temp:   98 F (36.7 C)   TempSrc:   Oral   SpO2:  93% 94%   Weight: 104.3 kg  107 kg   Height: 5\' 11"  (1.803 m)  5\' 11"  (1.803 m)     Intake/Output Summary (Last 24 hours) at 09/27/2018 1451 Last data filed at 09/27/2018 1400 Gross per 24 hour  Intake 600 ml  Output 2900 ml  Net -2300 ml   Filed Weights    09/27/18 0543 09/27/18 0903  Weight: 104.3 kg 107 kg   Body mass index is 32.9 kg/m.  General:  Well nourished, well developed, in no acute distress HEENT: normal Lymph: no adenopathy Neck: no JVD Endocrine:  No thryomegaly Vascular: No carotid bruits; FA pulses 2+ bilaterally without bruits  Cardiac:  normal S1, S2; RRR; no murmur, soft S4  Lungs:  clear to auscultation bilaterally, no wheezing, rhonchi or rales  Abd: soft, nontender, no hepatomegaly  Ext: no edema Musculoskeletal:  No deformities, BUE and BLE strength normal and equal Skin: warm and dry  Neuro:  CNs 2-12 intact, no focal abnormalities noted Psych:  Normal affect   EKG:  The EKG was personally reviewed and demonstrates:  Sinus rhythm at rate of 87 bpm with chronic T wave inversion in lateral leads Telemetry:  Telemetry was personally reviewed and demonstrates: Sinus rhythm with rare PVC  Relevant CV Studies:  Echo 09/27/18 Study Conclusions  - Left ventricle: The cavity size was mildly dilated. Wall   thickness was increased in a pattern of mild LVH. Systolic   function was severely reduced. The estimated ejection fraction   was in the range of 20% to 25%. Diffuse hypokinesis. Features are   consistent with a pseudonormal left ventricular filling pattern,   with concomitant abnormal relaxation and increased filling   pressure (grade 2 diastolic dysfunction). Doppler parameters are   consistent with high ventricular filling pressure. - Aortic valve: There was trivial regurgitation. - Mitral valve: There was mild regurgitation. - Left atrium: The atrium was severely dilated. - Right ventricle: The cavity size was mildly dilated. Systolic   function was mildly reduced. - Right atrium: The atrium was moderately dilated. - Pericardium, extracardiac: A trivial pericardial effusion was   identified.  Impressions:  - Severe global reduction in LV systolic function; mild LVH and   LVE; moderate diastolic  dysfunction; trace AI; mild MR; biatrial   enlargement; mild RVE with mild RV dysfunction.  Laboratory Data:  Chemistry Recent Labs  Lab 09/27/18 0634  NA 138  K 3.7  CL 107  CO2 24  GLUCOSE 102*  BUN 26*  CREATININE 1.19  CALCIUM 8.5*  GFRNONAA >60  GFRAA >60  ANIONGAP 7    Hematology Recent Labs  Lab 09/27/18 0634  WBC 7.0  RBC 3.96*  HGB 11.6*  HCT 37.5*  MCV 94.7  MCH 29.3  MCHC 30.9  RDW 14.4  PLT 293  Recent Labs  Lab 09/27/18 0639  TROPIPOC 0.05    BNP Recent Labs  Lab 09/27/18 0926  BNP 1,531.1*    Radiology/Studies:  Dg Chest 2 View  Result Date: 09/27/2018 CLINICAL DATA:  Shortness of breath for 10 days. EXAM: CHEST - 2 VIEW COMPARISON:  None. FINDINGS: Cardiac enlargement. Pulmonary vascular congestion. Mild perihilar interstitial changes suggesting early edema. No pleural effusions. No pneumothorax. No focal consolidation. Mediastinal contours appear intact. IMPRESSION: Cardiac enlargement with pulmonary vascular congestion and mild perihilar edema. Electronically Signed   By: Burman Nieves M.D.   On: 09/27/2018 06:08    Assessment and Plan:   1. 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  He diuresed 2.3 L with IV Lasix 40 mg x 1.  However weight of 5 pounds, doubt acute.  -Usually we avoid beta-blocker given history of cocaine abuse however will start low-dose carvedilol 3.125 mg given severe LV dysfunction after discussion with Dr. Ladona Ridgel.  Patient does not has any evidence of ischemia.  He denies any exertional chest pressure/tightness and troponin negative.  EKG without acute ischemic changes.  Start losartan 25 mg daily.  Give additional IV Lasix 40mg  x 1 tonight.  Reevaluate in the morning, probably changed to low-dose p.o. medication.  Follow renal function closely.  2.  Hypertensive urgency -Started on amlodipine.  Add medication as described above.  3.   Polysubstance abuse -Advised cessation.  He will need case manager and social worker help to get established primary care and and help with medications.   For questions or updates, please contact [facility] Please consult [email or web address] for contact info under     Lorelei Pont, [place]  09/27/2018 2:51 PM   Cardiology Attending  Patient seen and examined. Agree with the findings as noted above with minimal modifcation. The patient has a longstanding polysubstance abuse history who presents with several days of worsening sob, PND, Orthopnea and positive tox screen. He has not had angina and his cardiac markers are negative. He has been treated with IV lasix and he has had a nice diuresis. His exam reveals an S4.and minimal rales in the lungs and no edema. JVD is 8-9. CXR and echo have been reviewed.  Tele reveals NSR. A/P 1. Acute systolic heart failure - this is his initial presentation. He has a combination of uncontrolled HTN and polysubstance abuse leading to his cardiomyopathy. I have recommended he be started on guideline directed medical therapy including low dose beta blocker. He does not have any evidence of acute ischemia and if the patient decides to become more compliant he could have an outpatient coronary CT scan. Continue IV lasix. 2. Polysubstance abuse - he needs to stop using cocaine and stop drinking ETOH. 3. HTN - his blood pressure will require multiple medications. He will need close followup. He is encouraged to maintain a low sodium diet.  Leonia Reeves.D.

## 2018-09-27 NOTE — ED Notes (Signed)
Bed: XY33 Expected date:  Expected time:  Means of arrival:  Comments: 57 yo M/ Shortness of breath

## 2018-09-27 NOTE — ED Notes (Addendum)
I have just phoned report to [facility] on [address]. Will transport shortly.

## 2018-09-27 NOTE — Telephone Encounter (Signed)
Paged by answering service, patient has oblong grape size knot at right groin cath site area.  This has been painful.  She does not know if it is getting worse or not.  I have advised bedrest with avoiding bending and lifting.  Monitor symptoms.  She will give [place] a call if no improvement or worsening of symptoms.  Patient is appreciative of call and agree with plan.  She will take Tylenol for pain.

## 2018-09-27 NOTE — Progress Notes (Signed)
  Echocardiogram 2D Echocardiogram has been performed.  Dale [name] 09/27/2018, 11:12 AM

## 2018-09-27 NOTE — H&P (Signed)
History and Physical  Dale [name] YVO:[id number] DOB: Feb 25, 1961 DOA: 09/27/2018  Referring physician: Dr Wilkie Aye  PCP: [name], FNP  Outpatient Specialists: Home Patient coming from: Home  Chief Complaint: Shortness of breath  HPI: Dale [name] is a 57 y.o. male with medical history significant for hypertension, polysubstance abuse including cocaine who presented to [facility] ED with complaints of shortness of breath of 10 days duration.  Worse when laying down flat.  Denies chest pain or palpitations.  Non exertional.  Admits to chronic use of cocaine, last use was 3 days ago.  Associated with orthopnea.  Uses 2 pillows for sleep.  Denies any lower extremity swelling.  Has a history of hypertension however has not taken any medications due to loss of follow-up.  Does not have insurance and does not have a PCP.    ED Course: Upon presentation to the ED, patient has hypertensive emergency with associated pulmonary edema.  Chest x-ray revealed increase in pulmonary vascularity and cardiomegaly suggestive of pulmonary edema. 12 Lead EKG revealed LVH with no specific ST-T changes.  TRH asked to admit.  Review of Systems: Review of systems as noted in the HPI. All other systems reviewed and are negative.   Past Medical History:  Diagnosis Date  . Hypertension    History reviewed. No pertinent surgical history.  Social History:  reports that he has never smoked. He has never used smokeless tobacco. He reports that he drinks alcohol. He reports that he has current or past drug history. Drug: Marijuana. Frequency: 3.00 times per week.   Allergies  Allergen Reactions  . Hydrocodone Itching    History reviewed. No pertinent family history.  Maternal uncle with MI in his 45s.  Mother has no known chronic medical history.  Has not seen or heard from his father since he was 35 years old.  Prior to Admission medications   Not on File    Physical Exam: BP (!) 144/108   Pulse 84    Temp 97.7 F (36.5 C) (Oral)   Resp (!) 21   Ht 5\' 11"  (1.803 m)   Wt 104.3 kg   SpO2 97%   BMI 32.08 kg/m   . General: 57 y.o. year-old male well developed well nourished in no acute distress.  Alert and oriented x3. . Cardiovascular: Regular rate and rhythm with no rubs or gallops.  No thyromegaly or JVD noted.  Trace lower extremity edema. 2/4 pulses in all 4 extremities. Marland Kitchen Respiratory: Diffuse rales bilaterally with no wheezes. Good inspiratory effort. . Abdomen: Soft nontender nondistended with normal bowel sounds x4 quadrants. . Muskuloskeletal: No cyanosis, clubbing.  Trace edema in lower extremities noted bilaterally . Neuro: CN II-XII intact, strength, sensation, reflexes . Skin: No ulcerative lesions noted or rashes . Psychiatry: Judgement and insight appear normal. Mood is appropriate for condition and setting          Labs on Admission:  Basic Metabolic Panel: Recent Labs  Lab 09/27/18 0634  NA 138  K 3.7  CL 107  CO2 24  GLUCOSE 102*  BUN 26*  CREATININE 1.19  CALCIUM 8.5*   Liver Function Tests: No results for input(s): AST, ALT, ALKPHOS, BILITOT, PROT, ALBUMIN in the last 168 hours. No results for input(s): LIPASE, AMYLASE in the last 168 hours. No results for input(s): AMMONIA in the last 168 hours. CBC: Recent Labs  Lab 09/27/18 0634  WBC 7.0  NEUTROABS 4.9  HGB 11.6*  HCT 37.5*  MCV 94.7  PLT 293  Cardiac Enzymes: No results for input(s): CKTOTAL, CKMB, CKMBINDEX, TROPONINI in the last 168 hours.  BNP (last 3 results) No results for input(s): BNP in the last 8760 hours.  ProBNP (last 3 results) No results for input(s): PROBNP in the last 8760 hours.  CBG: No results for input(s): GLUCAP in the last 168 hours.  Radiological Exams on Admission: Dg Chest 2 View  Result Date: 09/27/2018 CLINICAL DATA:  Shortness of breath for 10 days. EXAM: CHEST - 2 VIEW COMPARISON:  None. FINDINGS: Cardiac enlargement. Pulmonary vascular congestion.  Mild perihilar interstitial changes suggesting early edema. No pleural effusions. No pneumothorax. No focal consolidation. Mediastinal contours appear intact. IMPRESSION: Cardiac enlargement with pulmonary vascular congestion and mild perihilar edema. Electronically Signed   By: Burman Nieves M.D.   On: 09/27/2018 06:08    EKG: I independently viewed the EKG done and my findings are as followed: Sinus rhythm with a rate of 89.  LVH with no specific ST-T changes.  Assessment/Plan Present on Admission: **None**  Active Problems:   New onset of congestive heart failure (HCC)   Suspected new onset unspecified CHF Possibly secondary to untreated hypertension versus cocaine abuse Negative troponin Hypertensive emergency on presentation with pulmonary edema Started on amlodipine in the ED, continue Obtain 2D echo Heart healthy diet Low-sodium Obtain BNP and TSH Obtain lipid panel Start IV diuretics Strict I's and O's and daily weight Consult cardiology once 2D echo results  Hypertensive emergency associated with pulmonary edema Management as stated above  Pulmonary edema suspect cardiogenic Independently reviewed chest x-ray done on presentation which revealed increase in pulmonary vascularity and cardiomegaly Continue Lasix started in the ED O2 saturation stable at this time  Uncontrolled hypertension Untreated due to lack of insurance and PCP Continue amlodipine started in the ED IV hydralazine PRN for systolic blood pressure greater than 180 or diastolic greater than 105 Case manager consult to assist with medications  Cocaine abuse Cessation counseling at bedside May have contributed to uncontrolled hypertension and suspected heart failure  Polysubstance abuse including tobacco and alcohol States drinks alcohol occasionally (beers 2 cans once a week) Obtain UDS  CKD 2 No prior history to compare Creatinine on presentation 1.19 with GFR greater than 60 Monitor urine  output Repeat BMP in the morning  Normocytic anemia Hemoglobin 11.6 No sign of overt bleeding No prior history to compare Monitor Repeat CBC in the morning    DVT prophylaxis: Subcu Lovenox daily  Code Status: Full code  Family Communication: None at bedside  Disposition Plan: Admit to telemetry unit  Consults called: None  Admission status: Inpatient status   Patient will require at least 2 midnights for further evaluation and treatment of present condition.  Patient appears to have new onset unspecified heart failure which will require cardiology consultation if confirmed, has uncontrolled hypertension which is requiring treatment, has other comorbidities and advanced age.    Darlin Drop MD [facility] Pager [phone]  If 7PM-7AM, please contact night-coverage [email or web address] Password [facility]  09/27/2018, 8:27 AM

## 2018-09-28 ENCOUNTER — Telehealth: Payer: Self-pay | Admitting: Physician Assistant

## 2018-09-28 DIAGNOSIS — I5021 Acute systolic (congestive) heart failure: Secondary | ICD-10-CM

## 2018-09-28 DIAGNOSIS — N182 Chronic kidney disease, stage 2 (mild): Secondary | ICD-10-CM

## 2018-09-28 DIAGNOSIS — I10 Essential (primary) hypertension: Secondary | ICD-10-CM

## 2018-09-28 DIAGNOSIS — F1910 Other psychoactive substance abuse, uncomplicated: Secondary | ICD-10-CM

## 2018-09-28 DIAGNOSIS — I160 Hypertensive urgency: Secondary | ICD-10-CM

## 2018-09-28 DIAGNOSIS — J810 Acute pulmonary edema: Secondary | ICD-10-CM

## 2018-09-28 DIAGNOSIS — D649 Anemia, unspecified: Secondary | ICD-10-CM

## 2018-09-28 DIAGNOSIS — F1410 Cocaine abuse, uncomplicated: Secondary | ICD-10-CM

## 2018-09-28 LAB — CBC
HCT: 40 % (ref 39.0–52.0)
Hemoglobin: 13.3 g/dL (ref 13.0–17.0)
MCH: 29.5 pg (ref 26.0–34.0)
MCHC: 33.3 g/dL (ref 30.0–36.0)
MCV: 88.7 fL (ref 80.0–100.0)
Platelets: 338 10*3/uL (ref 150–400)
RBC: 4.51 MIL/uL (ref 4.22–5.81)
RDW: 14.2 % (ref 11.5–15.5)
WBC: 5.9 10*3/uL (ref 4.0–10.5)
nRBC: 0 % (ref 0.0–0.2)

## 2018-09-28 LAB — COMPREHENSIVE METABOLIC PANEL
ALT: 89 U/L — ABNORMAL HIGH (ref 0–44)
AST: 40 U/L (ref 15–41)
Albumin: 3.5 g/dL (ref 3.5–5.0)
Alkaline Phosphatase: 77 U/L (ref 38–126)
Anion gap: 9 (ref 5–15)
BUN: 29 mg/dL — ABNORMAL HIGH (ref 6–20)
CO2: 28 mmol/L (ref 22–32)
Calcium: 8.8 mg/dL — ABNORMAL LOW (ref 8.9–10.3)
Chloride: 103 mmol/L (ref 98–111)
Creatinine, Ser: 1.43 mg/dL — ABNORMAL HIGH (ref 0.61–1.24)
GFR calc Af Amer: >60 mL/min (ref >60)
GFR calc non Af Amer: 54 mL/min — ABNORMAL LOW (ref >60)
Glucose, Bld: 112 mg/dL — ABNORMAL HIGH (ref 70–99)
Potassium: 3.4 mmol/L — ABNORMAL LOW (ref 3.5–5.1)
Sodium: 140 mmol/L (ref 135–145)
Total Bilirubin: 0.4 mg/dL (ref 0.3–1.2)
Total Protein: 7 g/dL (ref 6.5–8.1)

## 2018-09-28 LAB — PHOSPHORUS: Phosphorus: 4.6 mg/dL (ref 2.5–4.6)

## 2018-09-28 LAB — MAGNESIUM: Magnesium: 2.4 mg/dL (ref 1.7–2.4)

## 2018-09-28 MED ORDER — CARVEDILOL 6.25 MG PO TABS
6.2500 mg | ORAL_TABLET | Freq: Two times a day (BID) | ORAL | Status: DC
  Administered 2018-09-28: 6.25 mg via ORAL
  Filled 2018-09-28: qty 1

## 2018-09-28 MED ORDER — POTASSIUM CHLORIDE CRYS ER 20 MEQ PO TBCR: 40.0000 meq | EXTENDED_RELEASE_TABLET | Freq: Every day | ORAL | Status: DC

## 2018-09-28 MED ORDER — FUROSEMIDE 20 MG PO TABS
20.0000 mg | ORAL_TABLET | Freq: Two times a day (BID) | ORAL | Status: DC
  Administered 2018-09-28 (×2): 20 mg via ORAL
  Filled 2018-09-28 (×2): qty 1

## 2018-09-28 MED ORDER — POTASSIUM CHLORIDE CRYS ER 20 MEQ PO TBCR
40.0000 meq | EXTENDED_RELEASE_TABLET | ORAL | Status: AC
  Administered 2018-09-28 (×2): 40 meq via ORAL
  Filled 2018-09-28 (×2): qty 2

## 2018-09-28 MED ORDER — LOSARTAN POTASSIUM 50 MG PO TABS
50.0000 mg | ORAL_TABLET | Freq: Every day | ORAL | Status: DC
  Administered 2018-09-28: 50 mg via ORAL
  Filled 2018-09-28: qty 1

## 2018-09-28 NOTE — Progress Notes (Signed)
[facility] PROGRESS NOTE    Progress Note  Dale [name]  ZOX:[id number] DOB: 12-08-1960 DOA: 09/27/2018 PCP: [name], FNP     Brief Narrative:   Dale [name] is an 57 y.o. male past medical history of uncontrolled hypertension polysubstance abuse and alcohol comes into his long ED complaining of shortness of breath of 10 days with orthopnea and lower extremity swelling in the ED he was found to be in hypertensive emergency and pulmonary edema chest x-ray revealed acute pulmonary edema twelve-lead EKG LVH  Assessment/Plan:   Acute systolic and diastolic heart failure: With a BNP of 1500, with a 2D echo showing ejection fraction of 20% and grade 2 diastolic heart failure with all chambers dilated. He was started on IV Lasix, continue strict I's and O's daily weights.  Change Lasix to oral. Cardiology has been consulted for possible cardiac cath.    Hypertensive urgency/essential hypertension: Discontinue Norvasc increase losartan. Continue Coreg and Lasix IV.  Cocaine abuse (HCC)/ Substance abuse (HCC) Counseling.  Chronic kidney disease, stage II (mild) At baseline.  Normochromic anemia: Need a colonoscopy as an outpatient.  RN Pressure Injury Documentation:    Estimated body mass index is 32.01 kg/m as calculated from the following:   Height as of this encounter: 5\' 11"  (1.803 m).   Weight as of this encounter: 104.1 kg. Malnutrition Type:      Malnutrition Characteristics:      Nutrition Interventions:       DVT prophylaxis: lovenox Family Communication:none Disposition Plan/Barrier to D/C: home in 2-3 days Code Status:     Code Status Orders  (From admission, onward)         Start     Ordered   09/27/18 0827  Full code  Continuous     09/27/18 0826        Code Status History    This patient has a current code status but no historical code status.        IV Access:    Peripheral IV   Procedures and diagnostic  studies:   Dg Chest 2 View  Result Date: 09/27/2018 CLINICAL DATA:  Shortness of breath for 10 days. EXAM: CHEST - 2 VIEW COMPARISON:  None. FINDINGS: Cardiac enlargement. Pulmonary vascular congestion. Mild perihilar interstitial changes suggesting early edema. No pleural effusions. No pneumothorax. No focal consolidation. Mediastinal contours appear intact. IMPRESSION: Cardiac enlargement with pulmonary vascular congestion and mild perihilar edema. Electronically Signed   By: Burman Nieves M.D.   On: 09/27/2018 06:08     Medical Consultants:    None.  Anti-Infectives:   None  Subjective:    Dale [name] he relates he is able to lay flat no further shortness of breath. Objective:    Vitals:   09/27/18 1600 09/28/18 0117 09/28/18 0500 09/28/18 0535  BP: (!) 145/94 (!) 144/96  (!) 140/101  Pulse: 90 88  79  Resp:  16  16  Temp:  98.6 F (37 C)  98.1 F (36.7 C)  TempSrc:  Oral  Oral  SpO2:  96%  96%  Weight:   104.1 kg   Height:        Intake/Output Summary (Last 24 hours) at 09/28/2018 0846 Last data filed at 09/28/2018 0600 Gross per 24 hour  Intake 1440 ml  Output 3500 ml  Net -2060 ml   Filed Weights   09/27/18 0543 09/27/18 0903 09/28/18 0500  Weight: 104.3 kg 107 kg 104.1 kg    Exam: General exam: In no  acute distress. Respiratory system: Good air movement and clear to auscultation. Cardiovascular system: S1 & S2 heard, RRR.  No JVD.  Gastrointestinal system: Abdomen is nondistended, soft and nontender.  Central nervous system: Alert and oriented. No focal neurological deficits. Extremities: No pedal edema. Skin: No rashes, lesions or ulcers Psychiatry: Judgement and insight appear normal. Mood & affect appropriate.    Data Reviewed:    Labs: Basic Metabolic Panel: Recent Labs  Lab 09/27/18 0634 09/28/18 0531  NA 138 140  K 3.7 3.4*  CL 107 103  CO2 24 28  GLUCOSE 102* 112*  BUN 26* 29*  CREATININE 1.19 1.43*  CALCIUM 8.5* 8.8*    MG  --  2.4  PHOS  --  4.6   GFR Estimated Creatinine Clearance: 71.7 mL/min (A) (by C-G formula based on SCr of 1.43 mg/dL (H)). Liver Function Tests: Recent Labs  Lab 09/28/18 0531  AST 40  ALT 89*  ALKPHOS 77  BILITOT 0.4  PROT 7.0  ALBUMIN 3.5   No results for input(s): LIPASE, AMYLASE in the last 168 hours. No results for input(s): AMMONIA in the last 168 hours. Coagulation profile No results for input(s): INR, PROTIME in the last 168 hours.  CBC: Recent Labs  Lab 09/27/18 0634 09/28/18 0531  WBC 7.0 5.9  NEUTROABS 4.9  --   HGB 11.6* 13.3  HCT 37.5* 40.0  MCV 94.7 88.7  PLT 293 338   Cardiac Enzymes: No results for input(s): CKTOTAL, CKMB, CKMBINDEX, TROPONINI in the last 168 hours. BNP (last 3 results) No results for input(s): PROBNP in the last 8760 hours. CBG: No results for input(s): GLUCAP in the last 168 hours. D-Dimer: No results for input(s): DDIMER in the last 72 hours. Hgb A1c: No results for input(s): HGBA1C in the last 72 hours. Lipid Profile: Recent Labs    09/27/18 0700  CHOL 161  HDL 55  LDLCALC 101*  TRIG 25  CHOLHDL 2.9   Thyroid function studies: Recent Labs    09/27/18 0926  TSH 1.265   Anemia work up: No results for input(s): VITAMINB12, FOLATE, FERRITIN, TIBC, IRON, RETICCTPCT in the last 72 hours. Sepsis Labs: Recent Labs  Lab 09/27/18 0634 09/28/18 0531  WBC 7.0 5.9   Microbiology No results found for this or any previous visit (from the past 240 hour(s)).   Medications:   . amLODipine  10 mg Oral Daily  . carvedilol  3.125 mg Oral BID WC  . enoxaparin (LOVENOX) injection  40 mg Subcutaneous Q24H  . furosemide  40 mg Intravenous Daily  . losartan  25 mg Oral Daily   Continuous Infusions:    LOS: 1 day   Marinda Elk  [facility]   *Please refer to [email or web address], password [email or web address] to get updated schedule on who will round on this patient, as hospitalists switch teams weekly. If 7PM-7AM,  please contact night-coverage at [email or web address], password [email or web address] for any overnight needs.  09/28/2018, 8:46 AM

## 2018-09-28 NOTE — Progress Notes (Addendum)
Progress Note  Patient Name: Dale [name] Date of Encounter: 09/28/2018  Primary Cardiologist: No primary care provider on file. - seen by Dr. Ladona Ridgel yesterday, likely Dr. Elease Hashimoto (new to [facility])  Subjective   Laying flat in bed without CP or SOB. Feeling fine. He indicates someone just spoke with him about cardiac cath and he does not understand why this would be pursued if he's feeling just fine. We had a long discussion about the significance of his new CHF diagnosis.  BP remains elevated.  Inpatient Medications    Scheduled Meds: . amLODipine  10 mg Oral Daily  . carvedilol  3.125 mg Oral BID WC  . enoxaparin (LOVENOX) injection  40 mg Subcutaneous Q24H  . furosemide  40 mg Intravenous Daily  . losartan  25 mg Oral Daily   Continuous Infusions:  PRN Meds: hydrALAZINE   Vital Signs    Vitals:   09/27/18 1600 09/28/18 0117 09/28/18 0500 09/28/18 0535  BP: (!) 145/94 (!) 144/96  (!) 140/101  Pulse: 90 88  79  Resp:  16  16  Temp:  98.6 F (37 C)  98.1 F (36.7 C)  TempSrc:  Oral  Oral  SpO2:  96%  96%  Weight:   104.1 kg   Height:        Intake/Output Summary (Last 24 hours) at 09/28/2018 0824 Last data filed at 09/28/2018 0600 Gross per 24 hour  Intake 1440 ml  Output 3500 ml  Net -2060 ml   Filed Weights   09/27/18 0543 09/27/18 0903 09/28/18 0500  Weight: 104.3 kg 107 kg 104.1 kg    Telemetry    NSR with 4 beat run NSVT - Personally Reviewed  Physical Exam   GEN: No acute distress.  HEENT: Normocephalic, atraumatic, sclera non-icteric. Neck: No JVD or bruits. Cardiac: RRR no murmurs, rubs, or gallops.  Radials/DP/PT 1+ and equal bilaterally.  Respiratory: Clear to auscultation bilaterally. Breathing is unlabored. GI: Soft, nontender, non-distended, BS +x 4. MS: no deformity. Extremities: No clubbing or cyanosis. No edema. Distal pedal pulses are 2+ and equal bilaterally. Neuro:  AAOx3. Follows commands. Psych:  Responds to questions  appropriately with a normal affect.  Labs    Chemistry Recent Labs  Lab 09/27/18 0634 09/28/18 0531  NA 138 140  K 3.7 3.4*  CL 107 103  CO2 24 28  GLUCOSE 102* 112*  BUN 26* 29*  CREATININE 1.19 1.43*  CALCIUM 8.5* 8.8*  PROT  --  7.0  ALBUMIN  --  3.5  AST  --  40  ALT  --  89*  ALKPHOS  --  77  BILITOT  --  0.4  GFRNONAA >60 54*  GFRAA >60 >60  ANIONGAP 7 9     Hematology Recent Labs  Lab 09/27/18 0634 09/28/18 0531  WBC 7.0 5.9  RBC 3.96* 4.51  HGB 11.6* 13.3  HCT 37.5* 40.0  MCV 94.7 88.7  MCH 29.3 29.5  MCHC 30.9 33.3  RDW 14.4 14.2  PLT 293 338    Cardiac EnzymesNo results for input(s): TROPONINI in the last 168 hours.  Recent Labs  Lab 09/27/18 0639  TROPIPOC 0.05     BNP Recent Labs  Lab 09/27/18 0926  BNP 1,531.1*     DDimer No results for input(s): DDIMER in the last 168 hours.   Radiology    Dg Chest 2 View  Result Date: 09/27/2018 CLINICAL DATA:  Shortness of breath for 10 days. EXAM: CHEST - 2 VIEW COMPARISON:  None. FINDINGS: Cardiac enlargement. Pulmonary vascular congestion. Mild perihilar interstitial changes suggesting early edema. No pleural effusions. No pneumothorax. No focal consolidation. Mediastinal contours appear intact. IMPRESSION: Cardiac enlargement with pulmonary vascular congestion and mild perihilar edema. Electronically Signed   By: Burman Nieves M.D.   On: 09/27/2018 06:08    Cardiac Studies   2d echo 09/27/18 Study Conclusions  - Left ventricle: The cavity size was mildly dilated. Wall   thickness was increased in a pattern of mild LVH. Systolic   function was severely reduced. The estimated ejection fraction   was in the range of 20% to 25%. Diffuse hypokinesis. Features are   consistent with a pseudonormal left ventricular filling pattern,   with concomitant abnormal relaxation and increased filling   pressure (grade 2 diastolic dysfunction). Doppler parameters are   consistent with high  ventricular filling pressure. - Aortic valve: There was trivial regurgitation. - Mitral valve: There was mild regurgitation. - Left atrium: The atrium was severely dilated. - Right ventricle: The cavity size was mildly dilated. Systolic   function was mildly reduced. - Right atrium: The atrium was moderately dilated. - Pericardium, extracardiac: A trivial pericardial effusion was   identified.  Impressions:  - Severe global reduction in LV systolic function; mild LVH and   LVE; moderate diastolic dysfunction; trace AI; mild MR; biatrial   enlargement; mild RVE with mild RV dysfunction  Patient Profile     57 y.o. male w/ HTN, polysubstance abuse (alcohol, tobacco, cocaine - currently only 2 beers per week), obesity who presented with 10-days history of SOB at rest and orthopnea. He was found to be hypertensive with associated pulmonary edema, EKG with LVH, probable CKD stage II, and normocytic anemia. 2D echo 09/27/18 showed mild LVH, EF 20-25%, grade 2 DD, mild MR, severely dilated LA, mildly dilated RV with mildly reduced RV function, mod RAE.  Assessment & Plan    1. Acute combined CHF - patient now appears euvolemic. Dr. David Stall has already d/c IV Lasix and changed to oral form. He also increased losartan and discontinued amlodipine given LV dysfunction. Agree with these changes. With lack of insurance I do not think he would yet be able to coordinate Entresto. Need to minimize barriers to compliance immediately out of the hospital. The patient states he will never use cocaine again. We discussed fatal risks associated with even one more time use, and interaction with BB. He states he last used 5 days prior to admission. I suspect we will further need to increase carvedilol to achieve adequate blood pressure control since amlodipine was stopped. Losartan has weak potency with regard to BP control. Dr. Ladona Ridgel felt that if the patient became more compliant as OP, would consider coronary  CT. I'll review plan for carvedilol and coronary CT with MD - I think this would be very challenging to coordinate as an outpatient given lack of insurance, but completely understand the concern against pursuing more urgent ischemic workup as the patient has not yet demonstrated the capacity for compliance to return for the necessary follow-up required for established CAD. For this reason, spironolactone would also be riskier to initiate without knowing if he would return for the required labs for monitoring. Social work has been consulted apparently for homelessness issues as well. Will need establishment with PCP. Reviewed 2g sodium restriction, 2L fluid restriction, daily weights with patient. Consult dietitian for dietary education. He has CHF booklet.  2. Accelerated HTN - see above. TSH wnl.   3. 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 If this continues to rise consider renal US.  4. Polysubstance abuse including active cocaine use - counseled on cessation.  5. NSVT - continue BB. Initiate KCl x 2 doses today for K 3.4, then daily. F/u labs in AM.  For questions or updates, please contact [facility] Please consult [email or web address] for contact info under Cardiology/STEMI.  Signed, Laurann Montana, PA-C 09/28/2018, 8:24 AM      Attending Note:   The patient was seen and examined.  Agree with assessment and plan as noted above.  Changes made to the above note as needed.  Patient seen and independently examined with Ronie Spies, PA .   We discussed all aspects of the encounter. I agree with the assessment and plan as stated above.  1.   Acute on chronic combined CHF:   Secondary to cocaine abuse, ETOH abuse, and possibly other causes.  He says he has stopped cocaine and ETOH. Is committed to taking meds Agree with Lasix, coreg,  Losartan, hydralazine, Kdur I'll see him in the office.  We discussed ischemic eval.  I agree  that this should be done in the outpatient setting .   We discussed low sodium diet   He needs to get up and around . Encouraged him to ambulate.    I have spent a total of 40 minutes with patient reviewing hospital  notes , telemetry, EKGs, labs and examining patient as well as establishing an assessment and plan that was discussed with the patient. > 50% of time was spent in direct patient care.    Vesta Mixer, Montez Hageman., MD, [facility] 09/28/2018, 12:15 PM [address]  [address] Office [phone] Pager [phone]

## 2018-09-28 NOTE — Telephone Encounter (Signed)
Please ignore yesterday's telephone encounter. It was error.

## 2018-09-28 NOTE — Telephone Encounter (Signed)
Error

## 2018-09-29 ENCOUNTER — Telehealth: Payer: Self-pay | Admitting: Cardiovascular Disease

## 2018-09-29 ENCOUNTER — Encounter (HOSPITAL_COMMUNITY): Payer: Self-pay

## 2018-09-29 ENCOUNTER — Other Ambulatory Visit: Payer: Self-pay

## 2018-09-29 ENCOUNTER — Encounter (HOSPITAL_COMMUNITY): Payer: Self-pay | Admitting: Physician Assistant

## 2018-09-29 ENCOUNTER — Emergency Department (HOSPITAL_COMMUNITY)
Admission: EM | Admit: 2018-09-29 | Discharge: 2018-09-29 | Disposition: A | Discharge status: Home / Self Care | Payer: Medicaid Other | Attending: Emergency Medicine | Admitting: Emergency Medicine

## 2018-09-29 DIAGNOSIS — Z760 Encounter for issue of repeat prescription: Secondary | ICD-10-CM | POA: Insufficient documentation

## 2018-09-29 DIAGNOSIS — I10 Essential (primary) hypertension: Secondary | ICD-10-CM | POA: Insufficient documentation

## 2018-09-29 DIAGNOSIS — Z5321 Procedure and treatment not carried out due to patient leaving prior to being seen by health care provider: Secondary | ICD-10-CM | POA: Insufficient documentation

## 2018-09-29 LAB — BASIC METABOLIC PANEL
Anion gap: 7 (ref 5–15)
BUN: 37 mg/dL — ABNORMAL HIGH (ref 6–20)
CO2: 28 mmol/L (ref 22–32)
Calcium: 9 mg/dL (ref 8.9–10.3)
Chloride: 106 mmol/L (ref 98–111)
Creatinine, Ser: 1.49 mg/dL — ABNORMAL HIGH (ref 0.61–1.24)
GFR calc Af Amer: 59 mL/min — ABNORMAL LOW (ref >60)
GFR calc non Af Amer: 51 mL/min — ABNORMAL LOW (ref >60)
Glucose, Bld: 102 mg/dL — ABNORMAL HIGH (ref 70–99)
Potassium: 4.3 mmol/L (ref 3.5–5.1)
Sodium: 141 mmol/L (ref 135–145)

## 2018-09-29 LAB — HEPATIC FUNCTION PANEL
ALT: 62 U/L — ABNORMAL HIGH (ref 0–44)
AST: 22 U/L (ref 15–41)
Albumin: 3.4 g/dL — ABNORMAL LOW (ref 3.5–5.0)
Alkaline Phosphatase: 66 U/L (ref 38–126)
Bilirubin, Direct: 0.1 mg/dL (ref 0.0–0.2)
Indirect Bilirubin: 0.3 mg/dL (ref 0.3–0.9)
Total Bilirubin: 0.4 mg/dL (ref 0.3–1.2)
Total Protein: 6.6 g/dL (ref 6.5–8.1)

## 2018-09-29 NOTE — Telephone Encounter (Signed)
  TOC appt 10/05/18 @ 2:00 with Norma Fredrickson per Ronie Spies

## 2018-09-29 NOTE — Progress Notes (Signed)
Pt very agitated this morning, anxious to leave. RN encouraged pt to stay, educated pt on the importance of discharge and follow up instructions. Pt reconsidered staying, but ultimately chose to leave AMA. Refused to sign AMA paperwork. IV and telemetry removed. Nashae Maudlin, Lavone Orn, RN

## 2018-09-29 NOTE — ED Triage Notes (Signed)
Pt states that he was admitted on the 4th floor, but left AMA this morning because he "needed to get his social security card". Pt states that he needs an rx for BP meds. Pt states that he has a job orientation at [employer] today, and needed to make the appt.

## 2018-09-29 NOTE — Telephone Encounter (Signed)
**Note De-identified  Obfuscation** The pt is being discharged today. We will call him tomorrow. 

## 2018-09-29 NOTE — Progress Notes (Addendum)
Progress Note  Patient Name: Dale [name] Date of Encounter: 09/29/2018  Primary Cardiologist: Kristeen Miss, MD  Subjective   Patient feels much better, back to baseline. No SOB, orthopnea, LEE. BP improved. BUN/Cr have bumped slightly further. He is upset that breakfast was late. He is homeless.   Inpatient Medications    Scheduled Meds: . carvedilol  6.25 mg Oral BID WC  . enoxaparin (LOVENOX) injection  40 mg Subcutaneous Q24H  . losartan  50 mg Oral Daily  . potassium chloride  40 mEq Oral Daily   Continuous Infusions:  PRN Meds: hydrALAZINE   Vital Signs    Vitals:   09/28/18 1354 09/28/18 1530 09/28/18 2131 09/29/18 0603  BP: (!) 140/97 (!) 154/109 (!) 134/96 129/87  Pulse: 84  85 80  Resp: 16  18 19   Temp: 97.8 F (36.6 C)  (!) 97.5 F (36.4 C) 97.7 F (36.5 C)  TempSrc: Oral  Oral Oral  SpO2: 98%  100% 98%  Weight:    104 kg  Height:        Intake/Output Summary (Last 24 hours) at 09/29/2018 0826 Last data filed at 09/29/2018 0500 Gross per 24 hour  Intake -  Output 3400 ml  Net -3400 ml   Filed Weights   09/27/18 0903 09/28/18 0500 09/29/18 0603  Weight: 107 kg 104.1 kg 104 kg    Telemetry    NSR - Personally Reviewed  Physical Exam   GEN: No acute distress.  HEENT: Normocephalic, atraumatic, sclera non-icteric. Neck: No JVD or bruits. Cardiac: RRR no murmurs, rubs, or gallops.  Radials/DP/PT 1+ and equal bilaterally.  Respiratory: Clear to auscultation bilaterally. Breathing is unlabored. GI: Soft, nontender, non-distended, BS +x 4. MS: no deformity. Extremities: No clubbing or cyanosis. No edema. Distal pedal pulses are 2+ and equal bilaterally. Neuro:  AAOx3. Follows commands. Psych:  Responds to questions appropriately with a normal affect.  Labs    Chemistry Recent Labs  Lab 09/27/18 0634 09/28/18 0531 09/29/18 0530  NA 138 140 141  K 3.7 3.4* 4.3  CL 107 103 106  CO2 24 28 28   GLUCOSE 102* 112* 102*  BUN 26* 29*  37*  CREATININE 1.19 1.43* 1.49*  CALCIUM 8.5* 8.8* 9.0  PROT  --  7.0 6.6  ALBUMIN  --  3.5 3.4*  AST  --  40 22  ALT  --  89* 62*  ALKPHOS  --  77 66  BILITOT  --  0.4 0.4  GFRNONAA >60 54* 51*  GFRAA >60 >60 59*  ANIONGAP 7 9 7      Hematology Recent Labs  Lab 09/27/18 0634 09/28/18 0531  WBC 7.0 5.9  RBC 3.96* 4.51  HGB 11.6* 13.3  HCT 37.5* 40.0  MCV 94.7 88.7  MCH 29.3 29.5  MCHC 30.9 33.3  RDW 14.4 14.2  PLT 293 338    Cardiac EnzymesNo results for input(s): TROPONINI in the last 168 hours.  Recent Labs  Lab 09/27/18 0639  TROPIPOC 0.05     BNP Recent Labs  Lab 09/27/18 0926  BNP 1,531.1*     DDimer No results for input(s): DDIMER in the last 168 hours.   Radiology    No results found.  Cardiac Studies   2d echo 09/27/18 Study Conclusions  - Left ventricle: The cavity size was mildly dilated. Wall thickness was increased in a pattern of mild LVH. Systolic function was severely reduced. The estimated ejection fraction was in the range of 20% to 25%. Diffuse  hypokinesis. Features are consistent with a pseudonormal left ventricular filling pattern, with concomitant abnormal relaxation and increased filling pressure (grade 2 diastolic dysfunction). Doppler parameters are consistent with high ventricular filling pressure. - Aortic valve: There was trivial regurgitation. - Mitral valve: There was mild regurgitation. - Left atrium: The atrium was severely dilated. - Right ventricle: The cavity size was mildly dilated. Systolic function was mildly reduced. - Right atrium: The atrium was moderately dilated. - Pericardium, extracardiac: A trivial pericardial effusion was identified.  Impressions:  - Severe global reduction in LV systolic function; mild LVH and LVE; moderate diastolic dysfunction; trace AI; mild MR; biatrial enlargement; mild RVE with mild RV dysfunction  Patient Profile     57 y.o. male w/ HTN,  polysubstance abuse (alcohol, tobacco, cocaine; currently only 2 beers per week), obesity who presented with 10-days history of SOB at rest and orthopnea. He was found to be hypertensive with associated pulmonary edema, EKG with LVH, probable CKD stage II, and positive UDS for cocaine. 2D echo 09/27/18 showed mild LVH, EF 20-25%, grade 2 DD, mild MR, severely dilated LA, mildly dilated RV with mildly reduced RV function, mod RAE.  Assessment & Plan    1. Acute combined CHF - patient now appears euvolemic. BP improved. BUN/Cr have risen so will hold Lasix - I suspect he will be able to use this only PRN but will finalize dc recs with MD. He is eager to be discharged. With lack of insurance I do not think he would yet be able to coordinate Entresto. Compliance may be an issue with the necessary follow-up needed for spiro. Need to minimize barriers to compliance immediately out of the hospital. We will arrange outpatient follow-up in the office. If compliance is demonstrated would need to pursue ischemic evaluation. Reviewed 2g sodium restriction, 2L fluid restriction, daily weights with patient this admission.  2. Accelerated HTN - improved.  3. 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 His prior OP values were 1.2-1.5. This will need to be followed in the office.  4. Polysubstance abuse including active cocaine use - counseled on cessation.  5. NSVT - continue BB. Lytes normal. Hold further KCl given holding standing Lasix.  Dr. Harvie Bridge care team does not have availability in timeframe needed so I scheduled TOC f/u through our office with Norma Fredrickson 12/2 at 2pm (her usual TOC slots are full with routine follow-ups, so used 2pm slot).  For questions or updates, please contact [facility] Please consult [email or web address] for contact info under Cardiology/STEMI.  Signed, Laurann Montana, PA-C 09/29/2018, 8:26  AM    Attending Note:   The patient was seen and examined.  Agree with assessment and plan as noted above.  Changes made to the above note as needed.  Patient seen and independently examined with Ronie Spies, PA .   We discussed all aspects of the encounter. I agree with the assessment and plan as stated above.  1.    CHF:    Pt left the hospital AMA before I could see him. We had talked about the importance of avoiding cocaine and being compliant with meds.     I have spent a total of 40 minutes with patient reviewing hospital  notes , telemetry, EKGs, labs and examining patient as well as establishing an assessment and plan that was discussed with the patient. > 50% of time was spent in direct patient  care.    Alvia Grove., MD, [facility] 09/29/2018, 3:54 PM [address]  [address] Office [phone] Pager [phone]

## 2018-09-29 NOTE — ED Notes (Signed)
Pt handed in labels to registration and sts "he is leaving".Dale [name]

## 2018-09-29 NOTE — Discharge Summary (Signed)
Physician Discharge Summary  Dale [name] RFF:[id number] DOB: [birth year] DOA: 09/27/2018  PCP: [name], FNP  Admit date: 09/27/2018 Discharge date: 09/29/2018  Admitted From: home Disposition:  Home     LEFT AMA Recommendations for Outpatient Follow-up:  1. Follow up with Cardiology in 1 weeks   Home Health:no Equipment/Devices:none   Discharge Condition:stable CODE STATUS:full Diet recommendation: Heart Healthy   Brief/Interim Summary: 57 y.o. male past medical history of uncontrolled hypertension polysubstance abuse and alcohol comes into his long ED complaining of shortness of breath of 10 days with orthopnea and lower extremity swelling in the ED he was found to be in hypertensive emergency and pulmonary edema chest x-ray revealed acute pulmonary edema twelve-lead EKG LVH.  Discharge Diagnoses:  Active Problems:   Essential hypertension   Acute systolic CHF (congestive heart failure) (HCC)   Hypertensive urgency   Acute pulmonary edema (HCC)   Cocaine abuse (HCC)   Substance abuse (HCC)   Chronic kidney disease, stage II (mild)   Normochromic anemia Acute systolic and diastolic heart failure: With a BNP of 1500 2D echo showing an ejection fraction of 20% with grade 2 diastolic heart failure, and a UDS positive for cocaine. He relates he has not taken hypertensive medication over 5 years. He was started on IV Lasix diuresis well, cardiology was consulted and recommended follow-up as an output patient with them. The patient left AGAINST MEDICAL ADVICE on 09/29/2018.  Hypertensive urgency/essential hypertension: He was started on Coreg Lasix and losartan patient left the hospital without his medication.  Cocaine abuse: Counseling was done.  Chronic kidney disease stage II: Creatinine at baseline.  Normocytic anemia: He will need a colonoscopy as an outpatient.   Discharge Instructions   Allergies as of 09/29/2018      Reactions   Hydrocodone  Itching      Medication List    You have not been prescribed any medications.    Follow-up Information    Rosalio Macadamia, NP Follow up.   Specialties:  Nurse Practitioner, Interventional Cardiology, Cardiology, Radiology Why:  [facility] - 10/05/18 at 2pm. Arrive 15 minutes ahead of time to get registered and signed in. Lawson Fiscal is one of the nurse practitioners that works with Dr. Elease Hashimoto and the cardiology team. Contact information: [address] [place] [address] [phone]          Allergies  Allergen Reactions  . Hydrocodone Itching    Consultations:  CArdiology   Procedures/Studies: Dg Chest 2 View  Result Date: 09/27/2018 CLINICAL DATA:  Shortness of breath for 10 days. EXAM: CHEST - 2 VIEW COMPARISON:  None. FINDINGS: Cardiac enlargement. Pulmonary vascular congestion. Mild perihilar interstitial changes suggesting early edema. No pleural effusions. No pneumothorax. No focal consolidation. Mediastinal contours appear intact. IMPRESSION: Cardiac enlargement with pulmonary vascular congestion and mild perihilar edema. Electronically Signed   By: Burman Nieves M.D.   On: 09/27/2018 06:08      Subjective: Of the hospital without being seen  Discharge Exam: Vitals:   09/28/18 2131 09/29/18 0603  BP: (!) 134/96 129/87  Pulse: 85 80  Resp: 18 19  Temp: (!) 97.5 F (36.4 C) 97.7 F (36.5 C)  SpO2: 100% 98%   Vitals:   09/28/18 1354 09/28/18 1530 09/28/18 2131 09/29/18 0603  BP: (!) 140/97 (!) 154/109 (!) 134/96 129/87  Pulse: 84  85 80  Resp: 16  18 19   Temp: 97.8 F (36.6 C)  (!) 97.5 F (36.4 C) 97.7 F (36.5  C)  TempSrc: Oral  Oral Oral  SpO2: 98%  100% 98%  Weight:    104 kg  Height:        General: Pt is alert, awake, not in acute distress Cardiovascular: RRR, S1/S2 +, no rubs, no gallops Respiratory: CTA bilaterally, no wheezing, no rhonchi Abdominal: Soft, NT, ND, bowel sounds + Extremities: no edema, no  cyanosis    The results of significant diagnostics from this hospitalization (including imaging, microbiology, ancillary and laboratory) are listed below for reference.     Microbiology: No results found for this or any previous visit (from the past 240 hour(s)).   Labs: BNP (last 3 results) Recent Labs    09/27/18 0926  BNP 1,531.1*   Basic Metabolic Panel: Recent Labs  Lab 09/27/18 0634 09/28/18 0531 09/29/18 0530  NA 138 140 141  K 3.7 3.4* 4.3  CL 107 103 106  CO2 24 28 28   GLUCOSE 102* 112* 102*  BUN 26* 29* 37*  CREATININE 1.19 1.43* 1.49*  CALCIUM 8.5* 8.8* 9.0  MG  --  2.4  --   PHOS  --  4.6  --    Liver Function Tests: Recent Labs  Lab 09/28/18 0531 09/29/18 0530  AST 40 22  ALT 89* 62*  ALKPHOS 77 66  BILITOT 0.4 0.4  PROT 7.0 6.6  ALBUMIN 3.5 3.4*   No results for input(s): LIPASE, AMYLASE in the last 168 hours. No results for input(s): AMMONIA in the last 168 hours. CBC: Recent Labs  Lab 09/27/18 0634 09/28/18 0531  WBC 7.0 5.9  NEUTROABS 4.9  --   HGB 11.6* 13.3  HCT 37.5* 40.0  MCV 94.7 88.7  PLT 293 338   Cardiac Enzymes: No results for input(s): CKTOTAL, CKMB, CKMBINDEX, TROPONINI in the last 168 hours. BNP: Invalid input(s): POCBNP CBG: No results for input(s): GLUCAP in the last 168 hours. D-Dimer No results for input(s): DDIMER in the last 72 hours. Hgb A1c No results for input(s): HGBA1C in the last 72 hours. Lipid Profile Recent Labs    09/27/18 0700  CHOL 161  HDL 55  LDLCALC 101*  TRIG 25  CHOLHDL 2.9   Thyroid function studies Recent Labs    09/27/18 0926  TSH 1.265   Anemia work up No results for input(s): VITAMINB12, FOLATE, FERRITIN, TIBC, IRON, RETICCTPCT in the last 72 hours. Urinalysis    Component Value Date/Time   LABSPEC 1.025 03/11/2016 0934   PHURINE 5.5 03/11/2016 0934   GLUCOSEU 500 (A) 03/11/2016 0934   HGBUR TRACE (A) 03/11/2016 0934   BILIRUBINUR NEGATIVE 03/11/2016 0934    KETONESUR NEGATIVE 03/11/2016 0934   PROTEINUR 30 (A) 03/11/2016 0934   UROBILINOGEN 0.2 03/11/2016 0934   NITRITE NEGATIVE 03/11/2016 0934   LEUKOCYTESUR NEGATIVE 03/11/2016 0934   Sepsis Labs Invalid input(s): PROCALCITONIN,  WBC,  LACTICIDVEN Microbiology No results found for this or any previous visit (from the past 240 hour(s)).   Time coordinating discharge:40 minutes  SIGNED:   Marinda Elk, MD  [facility] 09/29/2018, 9:52 AM Pager   If 7PM-7AM, please contact night-coverage [email or web address] Password [email or web address]

## 2018-09-30 NOTE — Telephone Encounter (Signed)
Left a message with the pts Mother (not on DPR), to have him call the office back today, and request to speak with a triage nurse, for TCM follow-up call.  Mother states he just left out, but will be back home shortly, and she will endorse this message.

## 2018-10-05 ENCOUNTER — Encounter: Payer: Self-pay | Admitting: Nurse Practitioner

## 2018-10-05 ENCOUNTER — Ambulatory Visit (INDEPENDENT_AMBULATORY_CARE_PROVIDER_SITE_OTHER): Payer: Self-pay | Admitting: Nurse Practitioner

## 2018-10-05 VITALS — BP 142/90 | HR 90 | Ht 71.0 in | Wt 236.8 lb

## 2018-10-05 DIAGNOSIS — I5022 Chronic systolic (congestive) heart failure: Secondary | ICD-10-CM

## 2018-10-05 MED ORDER — FUROSEMIDE 40 MG PO TABS: 40.0000 mg | ORAL_TABLET | Freq: Every day | ORAL | 6 refills | Status: DC

## 2018-10-05 MED ORDER — POTASSIUM CHLORIDE CRYS ER 20 MEQ PO TBCR: 20.0000 meq | EXTENDED_RELEASE_TABLET | Freq: Every day | ORAL | 6 refills | Status: DC

## 2018-10-05 MED ORDER — LOSARTAN POTASSIUM 50 MG PO TABS: 50.0000 mg | ORAL_TABLET | Freq: Every day | ORAL | 6 refills | Status: DC

## 2018-10-05 MED FILL — POTASSIUM CL ER 20 MEQ TAB: 20 | 30 days supply | Qty: 30 | Fill #0

## 2018-10-05 MED FILL — FUROSEMIDE 40 MG TAB: 40 | 30 days supply | Qty: 30 | Fill #0

## 2018-10-05 MED FILL — LOSARTAN POTASSIUM 50 MG TA: 50 | 30 days supply | Qty: 30 | Fill #0

## 2018-10-05 NOTE — Telephone Encounter (Signed)
Patient unavailable, he has an appt with Lawson Fiscal @ 2:00PM 12/2.

## 2018-10-05 NOTE — Patient Instructions (Signed)
We will be checking the following labs today - BMET   Medication Instructions:    I have sent in a RX for Lasix 40 mg to take one a day  I have sent in a RX for potassium to take one a day  I have sent in a RX for Losartan 50 mg to take one a day  These medicines are at [facility] - go there now and pick up.    If you need a refill on your cardiac medications before your next appointment, please call your pharmacy.     Testing/Procedures To Be Arranged:  N/A  Follow-Up:   See Dr. Elease Hashimoto in about 1 to 2 months    At [facility], you and your health needs are our priority.  As part of our continuing mission to provide you with exceptional heart care, we have created designated Provider Care Teams.  These Care Teams include your primary Cardiologist (physician) and Advanced Practice Providers (APPs -  Physician Assistants and Nurse Practitioners) who all work together to provide you with the care you need, when you need it.  Special Instructions:  . I have called the Child psychotherapist from [employer] to try and help you . No drugs . No alcohol . Less salt  Call the [facility] office at [phone] if you have any questions, problems or concerns.

## 2018-10-05 NOTE — Progress Notes (Signed)
CARDIOLOGY OFFICE NOTE  Date:  10/05/2018    Dale [name] Date of Birth: 1961/02/18 Medical Record #[id number]  PCP:  [name], FNP  Cardiologist:  Nahser  Chief Complaint  Patient presents with  . Hypertension  . Congestive Heart Failure    Post hospital visit - seen for Dr. Elease Hashimoto    History of Present Illness: Dale [name] is a 57 y.o. male who presents today for a post hospital visit. Seen for Dr. Elease Hashimoto. This was to be a TOC - however phone call not completed.   He has a hx of uncontrolled hypertension and polysubstance abuse including cocaine and alcohol.   He was admitted last month and cardiology was consulted for evaluation of CHF and progressive SOB. Found to be hypertensive and with associated pulmonary edema. He was diuresed.  Had not been on any hypertensive regimen at home due to lack of insurance and no PCP. He had continued to use cocaine prior to the admission. Lots of heavy alcohol use. Echo with EF of 25% with grade 2 DD. Left AMA. Homeless.   Comes in today. Here alone.  Says he left to "go get a job and went to orientation" and went back to the hospital. Tells me he is not using drugs. Says he is not drinking. To start a training program later this week. He has no medicine. He is homeless - goes from friend to friend. Probably getting too much salt with the recent holiday. No chest pain noted. No real swelling but weight is already going back up.   Past Medical History:  Diagnosis Date  . Alcohol abuse   . Chronic combined systolic and diastolic CHF (congestive heart failure) (HCC)    a. 09/27/18 showed mild LVH, EF 20-25%, grade 2 DD, mild MR, severely dilated LA, mildly dilated RV with mildly reduced RV function, mod RAE.  Marland Kitchen Cocaine abuse (HCC)   . Hypertension     No past surgical history on file.   Medications: No outpatient medications have been marked as taking for the 10/05/18 encounter (Office Visit) with Rosalio Macadamia, NP.      Allergies: Allergies  Allergen Reactions  . Hydrocodone Itching    Social History: The patient  reports that he has never smoked. He has never used smokeless tobacco. He reports that he drinks alcohol. He reports that he has current or past drug history. Drugs: Marijuana and Cocaine. Frequency: 3.00 times per week.   Family History: The patient's family history is not on file. His mother is alive.   Review of Systems: Please see the history of present illness.   Otherwise, the review of systems is positive for none.   All other systems are reviewed and negative.   Physical Exam: VS:  BP (!) 142/90 (BP Location: Left Arm, Patient Position: Sitting, Cuff Size: Large)   Pulse 90   Ht 5\' 11"  (1.803 m)   Wt 236 lb 12.8 oz (107.4 kg)   SpO2 95% Comment: at rest  BMI 33.03 kg/m  .  BMI Body mass index is 33.03 kg/m.  Wt Readings from Last 3 Encounters:  10/05/18 236 lb 12.8 oz (107.4 kg)  09/29/18 229 lb 4.5 oz (104 kg)  09/29/18 229 lb 3.2 oz (104 kg)    General: Alert and in no acute distress.   HEENT: Normal.  Neck: Supple, no JVD, carotid bruits, or masses noted.  Cardiac: Regular rate and rhythm. Probably has an S3.  No edema.  Respiratory:  Lungs are clear to auscultation bilaterally with normal work of breathing.  GI: Soft and nontender.  MS: No deformity or atrophy. Gait and ROM intact.  Skin: Warm and dry. Color is normal.  Neuro:  Strength and sensation are intact and no gross focal deficits noted.  Psych: Alert, appropriate and with normal affect.   LABORATORY DATA:  EKG:  EKG is not ordered today.  Lab Results  Component Value Date   WBC 5.9 09/28/2018   HGB 13.3 09/28/2018   HCT 40.0 09/28/2018   PLT 338 09/28/2018   GLUCOSE 102 (H) 09/29/2018   CHOL 161 09/27/2018   TRIG 25 09/27/2018   HDL 55 09/27/2018   LDLCALC 101 (H) 09/27/2018   ALT 62 (H) 09/29/2018   AST 22 09/29/2018   NA 141 09/29/2018   K 4.3 09/29/2018   CL 106 09/29/2018    CREATININE 1.49 (H) 09/29/2018   BUN 37 (H) 09/29/2018   CO2 28 09/29/2018   TSH 1.265 09/27/2018   HGBA1C 5.7 (H) 03/11/2016     BNP (last 3 results) Recent Labs    09/27/18 0926  BNP 1,531.1*    ProBNP (last 3 results) No results for input(s): PROBNP in the last 8760 hours.   Other Studies Reviewed Today:  Echo 09/27/18 Study Conclusions  - Left ventricle: The cavity size was mildly dilated. Wall thickness was increased in a pattern of mild LVH. Systolic function was severely reduced. The estimated ejection fraction was in the range of 20% to 25%. Diffuse hypokinesis. Features are consistent with a pseudonormal left ventricular filling pattern, with concomitant abnormal relaxation and increased filling pressure (grade 2 diastolic dysfunction). Doppler parameters are consistent with high ventricular filling pressure. - Aortic valve: There was trivial regurgitation. - Mitral valve: There was mild regurgitation. - Left atrium: The atrium was severely dilated. - Right ventricle: The cavity size was mildly dilated. Systolic function was mildly reduced. - Right atrium: The atrium was moderately dilated. - Pericardium, extracardiac: A trivial pericardial effusion was identified.  Impressions:  - Severe global reduction in LV systolic function; mild LVH and LVE; moderate diastolic dysfunction; trace AI; mild MR; biatrial enlargement; mild RVE with mild RV dysfunction.  Assessment/Plan:   1. Chronic systolic an diastolic HF - on no medicine - has no money for medicine. Says he is not using cocaine/alcohol. BMET today. Adding Lasix and potassium today - sending RX to [facility]  2. HTN - adding Losartan today.   3. Multi substance abuse - says he is not using at this time.   4. Poor social situation - unclear to me why he does not have VA benefits. I have called the social worker - will also send a message to her in Epic. Overall, poor  situation.   5. Non compliance - would need an ischemic evaluation if he can prove to be compliant.   6. CKD - recheck lab today  Current medicines are reviewed with the patient today.  The patient does not have concerns regarding medicines other than what has been noted above.  The following changes have been made:  See above.  Labs/ tests ordered today include:    Orders Placed This Encounter  Procedures  . Basic metabolic panel     Disposition:   FU with Dr. Elease Hashimoto in about a month or two.    Patient is agreeable to this plan and will call if any problems develop in the interim.   Signed: Norma Fredrickson, NP  10/05/2018 2:39 PM  [facility] [address] [place], [place]  [address] Phone: [phone] Fax: [phone]

## 2018-10-06 ENCOUNTER — Telehealth (HOSPITAL_COMMUNITY): Payer: Self-pay | Admitting: Licensed Clinical Social Worker

## 2018-10-06 LAB — BASIC METABOLIC PANEL
BUN/Creatinine Ratio: 19 (ref 9–20)
BUN: 22 mg/dL (ref 6–24)
CO2: 25 mmol/L (ref 20–29)
Calcium: 9 mg/dL (ref 8.7–10.2)
Chloride: 104 mmol/L (ref 96–106)
Creatinine, Ser: 1.18 mg/dL (ref 0.76–1.27)
GFR calc Af Amer: 80 mL/min/{1.73_m2} (ref >59)
GFR calc non Af Amer: 69 mL/min/{1.73_m2} (ref >59)
Glucose: 93 mg/dL (ref 65–99)
Potassium: 3.9 mmol/L (ref 3.5–5.2)
Sodium: 146 mmol/L — ABNORMAL HIGH (ref 134–144)

## 2018-10-06 NOTE — Telephone Encounter (Signed)
CSW received referral from NP to follow up with patient regarding social concerns. CSW contacted multiple numbers and left message for return call for further assistance. [name], LCSW, CCSW-MCS [phone]

## 2018-10-06 NOTE — Telephone Encounter (Signed)
CSW received return call from patient who states he is [place] the [place] with a friend for the next few days. He reports he obtained his medications at the [facility] pharmacy and states adherence with medications. He is enrolled in a training program the [employer] and starts tomorrow. Patient provided information on housing resources from [facility] to [employer]. Patient states he has been sober since last week and reports that his health scared him sober. He states "I am doing it on my own and keep praying". Patient states he is motivated for improved health and improved lifestyle. CSW provided resources and encouragement and will return call to patient next week to follow up on progress and resources. Patient agreeable to plan and will return call if needed. [name], LCSW, CCSW-MCS [phone]

## 2018-10-13 ENCOUNTER — Encounter: Payer: Self-pay | Admitting: Nurse Practitioner

## 2018-10-13 ENCOUNTER — Telehealth (HOSPITAL_COMMUNITY): Payer: Self-pay | Admitting: Licensed Clinical Social Worker

## 2018-10-13 NOTE — Telephone Encounter (Signed)
CSW received return call from patient stating "I need to get in to rehab". Patient reports he has relapsed and has been using cocaine. Patient reports he has tried to get help to no avail. CSW encouraged patient to call [facility] and begin the process for admission and return call to CSW once started. Patient returned call shortly asking CSW to follow up with June at [facility] @ [phone]. [facility] would like a medical clearance letter which CSW contacted Norma Fredrickson NP to request and fax to [facility]. CSW confirmed receipt of clearance letter and June reports she is waiting for return call from patient to schedule intake appointment. CSW has left message with patient's mother for patient to return call to inform of intake appointment. CSW will await return call from patient. [name], LCSW, CCSW-MCS [phone]

## 2018-10-13 NOTE — Telephone Encounter (Signed)
CSW left message for patient to return call. CSW spoke with patient last week and just called to follow up. CSW awaiting return call. [name], LCSW, CCSW-MCS [phone]

## 2018-10-15 ENCOUNTER — Emergency Department (HOSPITAL_COMMUNITY)
Admission: EM | Admit: 2018-10-15 | Discharge: 2018-10-15 | Disposition: A | Discharge status: Home / Self Care | Payer: Medicaid Other | Attending: Emergency Medicine | Admitting: Emergency Medicine

## 2018-10-15 ENCOUNTER — Encounter (HOSPITAL_COMMUNITY): Payer: Self-pay | Admitting: Emergency Medicine

## 2018-10-15 ENCOUNTER — Other Ambulatory Visit: Payer: Self-pay

## 2018-10-15 ENCOUNTER — Emergency Department (HOSPITAL_COMMUNITY): Payer: Medicaid Other

## 2018-10-15 DIAGNOSIS — I5042 Chronic combined systolic (congestive) and diastolic (congestive) heart failure: Secondary | ICD-10-CM | POA: Diagnosis not present

## 2018-10-15 DIAGNOSIS — I110 Hypertensive heart disease with heart failure: Secondary | ICD-10-CM | POA: Insufficient documentation

## 2018-10-15 DIAGNOSIS — Z79899 Other long term (current) drug therapy: Secondary | ICD-10-CM | POA: Diagnosis not present

## 2018-10-15 DIAGNOSIS — I509 Heart failure, unspecified: Secondary | ICD-10-CM

## 2018-10-15 DIAGNOSIS — R600 Localized edema: Secondary | ICD-10-CM | POA: Insufficient documentation

## 2018-10-15 DIAGNOSIS — R0602 Shortness of breath: Secondary | ICD-10-CM | POA: Diagnosis present

## 2018-10-15 LAB — COMPREHENSIVE METABOLIC PANEL
ALT: 17 U/L (ref 0–44)
AST: 13 U/L — ABNORMAL LOW (ref 15–41)
Albumin: 3.5 g/dL (ref 3.5–5.0)
Alkaline Phosphatase: 55 U/L (ref 38–126)
Anion gap: 9 (ref 5–15)
BUN: 24 mg/dL — ABNORMAL HIGH (ref 6–20)
CO2: 24 mmol/L (ref 22–32)
Calcium: 8.6 mg/dL — ABNORMAL LOW (ref 8.9–10.3)
Chloride: 106 mmol/L (ref 98–111)
Creatinine, Ser: 1.29 mg/dL — ABNORMAL HIGH (ref 0.61–1.24)
GFR calc Af Amer: >60 mL/min (ref >60)
GFR calc non Af Amer: >60 mL/min (ref >60)
Glucose, Bld: 99 mg/dL (ref 70–99)
Potassium: 3.6 mmol/L (ref 3.5–5.1)
Sodium: 139 mmol/L (ref 135–145)
Total Bilirubin: 0.9 mg/dL (ref 0.3–1.2)
Total Protein: 6.7 g/dL (ref 6.5–8.1)

## 2018-10-15 LAB — BRAIN NATRIURETIC PEPTIDE: B Natriuretic Peptide: 1433.5 pg/mL — ABNORMAL HIGH (ref 0.0–100.0)

## 2018-10-15 LAB — CBC WITH DIFFERENTIAL/PLATELET
Abs Immature Granulocytes: 0.02 10*3/uL (ref 0.00–0.07)
Basophils Absolute: 0 10*3/uL (ref 0.0–0.1)
Basophils Relative: 1 %
Eosinophils Absolute: 0.1 10*3/uL (ref 0.0–0.5)
Eosinophils Relative: 1 %
HCT: 37.5 % — ABNORMAL LOW (ref 39.0–52.0)
Hemoglobin: 12.2 g/dL — ABNORMAL LOW (ref 13.0–17.0)
Immature Granulocytes: 0 %
Lymphocytes Relative: 28 %
Lymphs Abs: 1.5 10*3/uL (ref 0.7–4.0)
MCH: 28.7 pg (ref 26.0–34.0)
MCHC: 32.5 g/dL (ref 30.0–36.0)
MCV: 88.2 fL (ref 80.0–100.0)
Monocytes Absolute: 0.7 10*3/uL (ref 0.1–1.0)
Monocytes Relative: 14 %
Neutro Abs: 2.9 10*3/uL (ref 1.7–7.7)
Neutrophils Relative %: 56 %
Platelets: 311 10*3/uL (ref 150–400)
RBC: 4.25 MIL/uL (ref 4.22–5.81)
RDW: 14 % (ref 11.5–15.5)
WBC: 5.1 10*3/uL (ref 4.0–10.5)
nRBC: 0 % (ref 0.0–0.2)

## 2018-10-15 LAB — I-STAT TROPONIN, ED: Troponin i, poc: 0.04 ng/mL (ref 0.00–0.08)

## 2018-10-15 MED ORDER — ALBUTEROL SULFATE (2.5 MG/3ML) 0.083% IN NEBU
5.0000 mg | INHALATION_SOLUTION | Freq: Once | RESPIRATORY_TRACT | Status: AC
  Administered 2018-10-15: 5 mg via RESPIRATORY_TRACT
  Filled 2018-10-15: qty 6

## 2018-10-15 MED ORDER — FUROSEMIDE 10 MG/ML IJ SOLN
40.0000 mg | Freq: Once | INTRAMUSCULAR | Status: AC
  Administered 2018-10-15: 40 mg via INTRAVENOUS
  Filled 2018-10-15: qty 4

## 2018-10-15 NOTE — Discharge Instructions (Addendum)
Increase your Lasix to 80 mg daily for the next 3 days, then resume your regular dose.  You need cardiology follow-up.  The contact information for the heart failure clinic has been provided in this discharge summary for you to call and make these arrangements.  Return to the emergency department if you develop chest pain, worsening breathing, high fever, or other new and concerning symptoms.

## 2018-10-15 NOTE — ED Triage Notes (Signed)
Patient states he has sob and high blood pressure. EMS bp 180/110. Patient is talking without difficultly.

## 2018-10-15 NOTE — ED Provider Notes (Signed)
[facility]-EMERGENCY DEPT Provider Note   CSN: [id number] Arrival date & time: 10/15/18  3403     History   Chief Complaint Chief Complaint  Patient presents with  . Shortness of Breath  . Hypertension    HPI Dale [name] is a 57 y.o. male.  Patient is a 57 year old male with past medical history of hypertensive cardiomyopathy, congestive heart failure, hypertension, cocaine and alcohol abuse.  He presents today for evaluation of shortness of breath.  He reports a several day history of dyspnea on exertion and orthopnea.  He was recently admitted for congestive heart failure, however left AGAINST MEDICAL ADVICE due to a job interview.  He now feels the same as when he was previously admitted.  Echocardiogram at that time showed an EF of 25% with dilated left ventricle and hypokinesis.  He also reports eating and drinking to excess at a recent Christmas party.  The history is provided by the patient.  Shortness of Breath  This is a recurrent problem. The average episode lasts 3 days. The problem occurs continuously.Episode onset: 3 days ago. The problem has been gradually worsening. Pertinent negatives include no fever, no cough, no sputum production, no leg pain and no leg swelling. He has tried nothing for the symptoms. The treatment provided no relief. Associated medical issues include heart failure.    Past Medical History:  Diagnosis Date  . Alcohol abuse   . Chronic combined systolic and diastolic CHF (congestive heart failure) (HCC)    a. 09/27/18 showed mild LVH, EF 20-25%, grade 2 DD, mild MR, severely dilated LA, mildly dilated RV with mildly reduced RV function, mod RAE.  Marland Kitchen Cocaine abuse (HCC)   . Hypertension     Patient Active Problem List   Diagnosis Date Noted  . Hypertensive urgency 09/28/2018  . Acute pulmonary edema (HCC) 09/28/2018  . Cocaine abuse (HCC) 09/28/2018  . Substance abuse (HCC) 09/28/2018  . Chronic kidney disease, stage  II (mild) 09/28/2018  . Normochromic anemia 09/28/2018  . Acute systolic CHF (congestive heart failure) (HCC) 09/27/2018  . Essential hypertension 03/11/2016  . Obesity 03/11/2016  . Glucosuria 03/11/2016    History reviewed. No pertinent surgical history.      Home Medications    Prior to Admission medications   Medication Sig Start Date End Date Taking? Authorizing Provider  furosemide (LASIX) 40 MG tablet Take 1 tablet (40 mg total) by mouth daily. 10/05/18 01/03/19  Rosalio Macadamia, NP  losartan (COZAAR) 50 MG tablet Take 1 tablet (50 mg total) by mouth daily. 10/05/18 01/03/19  Rosalio Macadamia, NP  potassium chloride SA (K-DUR,KLOR-CON) 20 MEQ tablet Take 1 tablet (20 mEq total) by mouth daily. 10/05/18   Rosalio Macadamia, NP    Family History History reviewed. No pertinent family history.  Social History Social History   Tobacco Use  . Smoking status: Never Smoker  . Smokeless tobacco: Never Used  Substance Use Topics  . Alcohol use: Yes    Comment: 2 quarts a week   . Drug use: Yes    Frequency: 3.0 times per week    Types: Marijuana, Cocaine     Allergies   Hydrocodone   Review of Systems Review of Systems  Constitutional: Negative for fever.  Respiratory: Negative for cough and sputum production.   Cardiovascular: Negative for leg swelling.  All other systems reviewed and are negative.    Physical Exam Updated Vital Signs BP (!) 160/123   Pulse 90  Temp (!) 97.4 F (36.3 C) (Oral)   Resp 18   Ht 5\' 11"  (1.803 m)   Wt 107.4 kg   SpO2 97%   BMI 33.02 kg/m   Physical Exam Vitals signs and nursing note reviewed.  Constitutional:      General: He is not in acute distress.    Appearance: He is well-developed. He is not diaphoretic.  HENT:     Head: Normocephalic and atraumatic.  Neck:     Musculoskeletal: Normal range of motion and neck supple.  Cardiovascular:     Rate and Rhythm: Normal rate and regular rhythm.     Heart sounds: No murmur.  No friction rub.  Pulmonary:     Effort: Pulmonary effort is normal. No respiratory distress.     Breath sounds: Examination of the right-lower field reveals rales. Examination of the left-lower field reveals rales. Rales present. No wheezing.     Comments: There are rales in the bases bilaterally. Abdominal:     General: Bowel sounds are normal. There is no distension.     Palpations: Abdomen is soft.     Tenderness: There is no abdominal tenderness.  Musculoskeletal: Normal range of motion.     Right lower leg: He exhibits no tenderness. Edema present.     Left lower leg: He exhibits no tenderness. Edema present.     Comments: There is trace edema of both lower extremities.  Skin:    General: Skin is warm and dry.  Neurological:     Mental Status: He is alert and oriented to person, place, and time.     Coordination: Coordination normal.      ED Treatments / Results  Labs (all labs ordered are listed, but only abnormal results are displayed) Labs Reviewed  COMPREHENSIVE METABOLIC PANEL  CBC WITH DIFFERENTIAL/PLATELET  BRAIN NATRIURETIC PEPTIDE  I-STAT TROPONIN, ED    EKG EKG Interpretation  Date/Time:  Thursday October 15 2018 06:53:46 EST Ventricular Rate:  87 PR Interval:    QRS Duration: 93 QT Interval:  380 QTC Calculation: 458 R Axis:   73 Text Interpretation:  Sinus rhythm Prolonged PR interval Probable left atrial enlargement Left ventricular hypertrophy Nonspecific T abnormalities, diffuse leads Baseline wander in lead(s) II III aVF Confirmed by Geoffery Lyons ([id number]) on 10/15/2018 7:57:07 AM   Radiology Dg Chest 2 View  Result Date: 10/15/2018 CLINICAL DATA:  Shortness of breath. EXAM: CHEST - 2 VIEW COMPARISON:  09/27/2018. FINDINGS: Cardiomegaly with pulmonary venous congestion and mild interstitial prominence again noted. Findings consistent mild CHF. Interstitial edema is less marked on today's exam than on prior exam. Small bilateral pleural effusions.  No pneumothorax. IMPRESSION: Findings consistent with congestive heart failure with mild bilateral pulmonary interstitial edema and small bilateral pleural effusions. Electronically Signed   By: Maisie Fus  Register   On: 10/15/2018 07:39    Procedures Procedures (including critical care time)  Medications Ordered in ED Medications  furosemide (LASIX) injection 40 mg (has no administration in time range)  albuterol (PROVENTIL) (2.5 MG/3ML) 0.083% nebulizer solution 5 mg (5 mg Nebulization Given 10/15/18 6045)     Initial Impression / Assessment and Plan / ED Course  I have reviewed the triage vital signs and the nursing notes.  Pertinent labs & imaging results that were available during my care of the patient were reviewed by me and considered in my medical decision making (see chart for details).  Patient with history of CHF with recent hospitalization presenting with shortness of breath.  He  has an elevated BNP and findings of CHF on his chest x-ray.  He was admitted the end of November, however left AGAINST MEDICAL ADVICE.  His echo at that time showed an EF of 25 to 30%.  He returns today with a similar presentation.  BNP is elevated and there are findings of CHF on his chest x-ray, however there is no hypoxia.  He is experiencing no chest pain and troponin and EKG are unchanged.  He was given IV Lasix here in the ER with a good diuresis.  I feel as though the patient requires cardiology consultation, but believe this can occur as an outpatient.  His dose of Lasix will be increased and he is to limit his fluid and salt intake.  Final Clinical Impressions(s) / ED Diagnoses   Final diagnoses:  None    ED Discharge Orders    None       Geoffery Lyons, MD 10/15/18 1019

## 2018-10-15 NOTE — ED Notes (Signed)
Patient currently in XR. 

## 2018-10-16 ENCOUNTER — Telehealth: Payer: Self-pay | Admitting: Cardiovascular Disease

## 2018-10-16 NOTE — Telephone Encounter (Signed)
I spoke with pt and reviewed 10/05/18 lab results with him.

## 2018-10-16 NOTE — Telephone Encounter (Signed)
New message ° ° °Patient is returning call for lab results. °

## 2018-11-02 ENCOUNTER — Telehealth: Payer: Self-pay | Admitting: Nurse Practitioner

## 2018-11-02 MED FILL — LOSARTAN POTASSIUM 50 MG TA: 50 | 30 days supply | Qty: 30 | Fill #1

## 2018-11-02 MED FILL — FUROSEMIDE 40 MG TAB: 40 | 30 days supply | Qty: 30 | Fill #1

## 2018-11-02 MED FILL — POTASSIUM CL ER 20 MEQ TAB: 20 | 30 days supply | Qty: 30 | Fill #1

## 2018-11-02 NOTE — Telephone Encounter (Signed)
Called and left message informing pt to call his pharmacy for refills.

## 2018-11-02 NOTE — Telephone Encounter (Signed)
New message       *STAT* If patient is at the pharmacy, call can be transferred to refill team.   1. Which medications need to be refilled? (please list name of each medication and dose if known) furosemide 40mg ,  potassium, losartan/potassium 50mg  2. Which pharmacy/location (including street and city if local pharmacy) is medication to be sent to[facility]  3. Do they need a 30 day or 90 day supply? Pt has an appt on 1-30.  Need enough to last until next appt Call pt when presc has been called in.  Pt states that ER at [facility] long states that fluid pills need to be stronger.

## 2018-11-07 ENCOUNTER — Other Ambulatory Visit: Payer: Self-pay

## 2018-11-07 ENCOUNTER — Encounter (HOSPITAL_COMMUNITY): Payer: Self-pay | Admitting: Emergency Medicine

## 2018-11-07 ENCOUNTER — Emergency Department (HOSPITAL_COMMUNITY): Payer: Medicaid Other

## 2018-11-07 ENCOUNTER — Inpatient Hospital Stay (HOSPITAL_COMMUNITY)
Admission: EM | Admit: 2018-11-07 | Discharge: 2018-11-12 | DRG: 291 | Disposition: A | Discharge status: Home / Self Care | Payer: Medicaid Other | Attending: Internal Medicine | Admitting: Internal Medicine

## 2018-11-07 DIAGNOSIS — I509 Heart failure, unspecified: Secondary | ICD-10-CM

## 2018-11-07 DIAGNOSIS — I472 Ventricular tachycardia: Secondary | ICD-10-CM | POA: Diagnosis present

## 2018-11-07 DIAGNOSIS — F1410 Cocaine abuse, uncomplicated: Secondary | ICD-10-CM | POA: Diagnosis present

## 2018-11-07 DIAGNOSIS — N182 Chronic kidney disease, stage 2 (mild): Secondary | ICD-10-CM | POA: Diagnosis present

## 2018-11-07 DIAGNOSIS — Z9111 Patient's noncompliance with dietary regimen: Secondary | ICD-10-CM

## 2018-11-07 DIAGNOSIS — T502X5A Adverse effect of carbonic-anhydrase inhibitors, benzothiadiazides and other diuretics, initial encounter: Secondary | ICD-10-CM | POA: Diagnosis present

## 2018-11-07 DIAGNOSIS — I130 Hypertensive heart and chronic kidney disease with heart failure and stage 1 through stage 4 chronic kidney disease, or unspecified chronic kidney disease: Principal | ICD-10-CM | POA: Diagnosis present

## 2018-11-07 DIAGNOSIS — I5043 Acute on chronic combined systolic (congestive) and diastolic (congestive) heart failure: Secondary | ICD-10-CM | POA: Diagnosis present

## 2018-11-07 DIAGNOSIS — Z79899 Other long term (current) drug therapy: Secondary | ICD-10-CM

## 2018-11-07 DIAGNOSIS — I5021 Acute systolic (congestive) heart failure: Secondary | ICD-10-CM | POA: Diagnosis present

## 2018-11-07 DIAGNOSIS — N179 Acute kidney failure, unspecified: Secondary | ICD-10-CM | POA: Diagnosis present

## 2018-11-07 LAB — TROPONIN I: Troponin I: 0.04 ng/mL (ref <0.03)

## 2018-11-07 LAB — CBC
HCT: 37.8 % — ABNORMAL LOW (ref 39.0–52.0)
Hemoglobin: 12.2 g/dL — ABNORMAL LOW (ref 13.0–17.0)
MCH: 27.9 pg (ref 26.0–34.0)
MCHC: 32.3 g/dL (ref 30.0–36.0)
MCV: 86.5 fL (ref 80.0–100.0)
Platelets: 337 10*3/uL (ref 150–400)
RBC: 4.37 MIL/uL (ref 4.22–5.81)
RDW: 14.5 % (ref 11.5–15.5)
WBC: 5.7 10*3/uL (ref 4.0–10.5)
nRBC: 0 % (ref 0.0–0.2)

## 2018-11-07 LAB — BASIC METABOLIC PANEL
Anion gap: 10 (ref 5–15)
BUN: 29 mg/dL — ABNORMAL HIGH (ref 6–20)
CO2: 23 mmol/L (ref 22–32)
Calcium: 9.1 mg/dL (ref 8.9–10.3)
Chloride: 104 mmol/L (ref 98–111)
Creatinine, Ser: 1.51 mg/dL — ABNORMAL HIGH (ref 0.61–1.24)
GFR calc Af Amer: 59 mL/min — ABNORMAL LOW (ref >60)
GFR calc non Af Amer: 51 mL/min — ABNORMAL LOW (ref >60)
Glucose, Bld: 108 mg/dL — ABNORMAL HIGH (ref 70–99)
Potassium: 3.9 mmol/L (ref 3.5–5.1)
Sodium: 137 mmol/L (ref 135–145)

## 2018-11-07 LAB — BRAIN NATRIURETIC PEPTIDE: B Natriuretic Peptide: 2330.9 pg/mL — ABNORMAL HIGH (ref 0.0–100.0)

## 2018-11-07 MED ORDER — FUROSEMIDE 10 MG/ML IJ SOLN
40.0000 mg | Freq: Once | INTRAMUSCULAR | Status: AC
  Administered 2018-11-08: 40 mg via INTRAVENOUS
  Filled 2018-11-07: qty 4

## 2018-11-07 MED ORDER — NITROGLYCERIN 2 % TD OINT
1.0000 [in_us] | TOPICAL_OINTMENT | Freq: Four times a day (QID) | TRANSDERMAL | Status: AC
  Administered 2018-11-08: 1 [in_us] via TOPICAL
  Filled 2018-11-07: qty 1

## 2018-11-07 NOTE — ED Provider Notes (Addendum)
[facility]-EMERGENCY DEPT Provider Note   CSN: [id number] Arrival date & time: 11/07/18  1905     History   Chief Complaint Chief Complaint  Patient presents with  . Shortness of Breath    HPI Dale [name] is a 58 y.o. male.  HPI Pt has history of CHF.  Recently in the hospital and noted to have a reduced EF.  Pt admits to eating a lot of salt today in the food that he ate, pizza.  He started feeling short of breath.  He felt like he couldn't catch his breath and he was having to breathe heavily.  NO CP.  No fever.  No vomiting or diarrhea.  He has been taking all of his medications.  Past Medical History:  Diagnosis Date  . Alcohol abuse   . Chronic combined systolic and diastolic CHF (congestive heart failure) (HCC)    a. 09/27/18 showed mild LVH, EF 20-25%, grade 2 DD, mild MR, severely dilated LA, mildly dilated RV with mildly reduced RV function, mod RAE.  Marland Kitchen Cocaine abuse (HCC)   . Hypertension     Patient Active Problem List   Diagnosis Date Noted  . Hypertensive urgency 09/28/2018  . Acute pulmonary edema (HCC) 09/28/2018  . Cocaine abuse (HCC) 09/28/2018  . Substance abuse (HCC) 09/28/2018  . Chronic kidney disease, stage II (mild) 09/28/2018  . Normochromic anemia 09/28/2018  . Acute systolic CHF (congestive heart failure) (HCC) 09/27/2018  . Essential hypertension 03/11/2016  . Obesity 03/11/2016  . Glucosuria 03/11/2016    History reviewed. No pertinent surgical history.      Home Medications    Prior to Admission medications   Medication Sig Start Date End Date Taking? Authorizing Provider  furosemide (LASIX) 40 MG tablet Take 1 tablet (40 mg total) by mouth daily. 10/05/18 01/03/19 Yes Rosalio Macadamia, NP  losartan (COZAAR) 50 MG tablet Take 1 tablet (50 mg total) by mouth daily. 10/05/18 01/03/19 Yes Rosalio Macadamia, NP  potassium chloride SA (K-DUR,KLOR-CON) 20 MEQ tablet Take 1 tablet (20 mEq total) by mouth daily. 10/05/18  Yes  Rosalio Macadamia, NP    Family History No family history on file.  Social History Social History   Tobacco Use  . Smoking status: Never Smoker  . Smokeless tobacco: Never Used  Substance Use Topics  . Alcohol use: Yes    Comment: 2 quarts a week   . Drug use: Yes    Frequency: 3.0 times per week    Types: Marijuana, Cocaine     Allergies   Hydrocodone   Review of Systems Review of Systems  All other systems reviewed and are negative.    Physical Exam Updated Vital Signs BP (!) 160/119   Pulse 90   Temp 98.1 F (36.7 C) (Oral)   Resp (!) 35   SpO2 99%   Physical Exam Vitals signs and nursing note reviewed.  Constitutional:      General: He is not in acute distress.    Appearance: He is well-developed.  HENT:     Head: Normocephalic and atraumatic.     Right Ear: External ear normal.     Left Ear: External ear normal.  Eyes:     General: No scleral icterus.       Right eye: No discharge.        Left eye: No discharge.     Conjunctiva/sclera: Conjunctivae normal.  Neck:     Musculoskeletal: Neck supple.  Trachea: No tracheal deviation.  Cardiovascular:     Rate and Rhythm: Normal rate and regular rhythm.  Pulmonary:     Effort: Pulmonary effort is normal. No respiratory distress.     Breath sounds: Normal breath sounds. No stridor. No wheezing or rales.  Abdominal:     General: Bowel sounds are normal. There is no distension.     Palpations: Abdomen is soft.     Tenderness: There is no abdominal tenderness. There is no guarding or rebound.  Musculoskeletal:        General: No tenderness.  Skin:    General: Skin is warm and dry.     Findings: No rash.  Neurological:     Mental Status: He is alert.     Cranial Nerves: No cranial nerve deficit (no facial droop, extraocular movements intact, no slurred speech).     Sensory: No sensory deficit.     Motor: No abnormal muscle tone or seizure activity.     Coordination: Coordination normal.       ED Treatments / Results  Labs (all labs ordered are listed, but only abnormal results are displayed) Labs Reviewed  CBC - Abnormal; Notable for the following components:      Result Value   Hemoglobin 12.2 (*)    HCT 37.8 (*)    All other components within normal limits  BASIC METABOLIC PANEL - Abnormal; Notable for the following components:   Glucose, Bld 108 (*)    BUN 29 (*)    Creatinine, Ser 1.51 (*)    GFR calc non Af Amer 51 (*)    GFR calc Af Amer 59 (*)    All other components within normal limits  BRAIN NATRIURETIC PEPTIDE - Abnormal; Notable for the following components:   B Natriuretic Peptide 2,330.9 (*)    All other components within normal limits  TROPONIN I - Abnormal; Notable for the following components:   Troponin I 0.04 (*)    All other components within normal limits  TROPONIN I    EKG EKG Interpretation  Date/Time:  Saturday November 07 2018 19:17:58 EST Ventricular Rate:  92 PR Interval:    QRS Duration: 96 QT Interval:  384 QTC Calculation: 475 R Axis:   44 Text Interpretation:  Sinus rhythm Probable left atrial enlargement Left ventricular hypertrophy Nonspecific T abnrm, anterolateral leads No significant change since last tracing Confirmed by Linwood Dibbles [phone]) on 11/07/2018 10:17:10 PM   Radiology Dg Chest 2 View  Result Date: 11/07/2018 CLINICAL DATA:  Patient reports intermittent SOB today. Hx CHF. Reports taking lasix as prescribed. EXAM: CHEST - 2 VIEW COMPARISON:  10/15/2018 FINDINGS: Cardiac silhouette is mildly enlarged. No mediastinal or hilar masses. No evidence of adenopathy. Lungs are clear. Probable minimal right pleural effusion. No pneumothorax. Skeletal structures are intact. IMPRESSION: 1. No acute findings.  No evidence of pneumonia or pulmonary edema. 2. Mild cardiomegaly.  Minimal right pleural effusion. Electronically Signed   By: Amie Portland M.D.   On: 11/07/2018 19:42    Procedures .Critical Care Performed by:  Linwood Dibbles, MD Authorized by: Linwood Dibbles, MD   Critical care provider statement:    Critical care time (minutes):  45   Critical care was time spent personally by me on the following activities:  Discussions with consultants, evaluation of patient's response to treatment, examination of patient, ordering and performing treatments and interventions, ordering and review of laboratory studies, ordering and review of radiographic studies, pulse oximetry, re-evaluation of patient's condition, obtaining  history from patient or surrogate and review of old charts   (including critical care time)  Medications Ordered in ED Medications  furosemide (LASIX) injection 40 mg (has no administration in time range)  nitroGLYCERIN (NITROGLYN) 2 % ointment 1 inch (has no administration in time range)     Initial Impression / Assessment and Plan / ED Course  I have reviewed the triage vital signs and the nursing notes.  Pertinent labs & imaging results that were available during my care of the patient were reviewed by me and considered in my medical decision making (see chart for details).  Clinical Course as of Nov 08 2  Sat Nov 07, 2018  2322 Previous istat showed trop 0.04.  BNP elevated   [JK]  2329 Will give dose of lasix.  Repeat trop at 0130   [JK]  2345 Pt still feeling short of breath. More tachypneic.  Suspect fluid overload with his diet compliance.  Will admit for diuresis, serial cardiac enzyme   [JK]    Clinical Course User Index [JK] Linwood Dibbles, MD    Pt with known history of CHF.  Poor compliance with his diet.  No overt pulm edema on CXR but presentation consistent with recurrent CHF exacerbation, elevated BNP.  Slight increase in troponin likely related to CHF.  Will start nitrates and lasix.  With his persistent dyspnea will admit for further treatment.  Final Clinical Impressions(s) / ED Diagnoses   Final diagnoses:  Acute on chronic congestive heart failure, unspecified  heart failure type [facility])          Linwood Dibbles, MD 11/08/18 0004

## 2018-11-07 NOTE — ED Triage Notes (Addendum)
Patient reports intermittent SOB today. Hx CHF. Reports taking lasix as prescribed. Anxious with EMS. Speaking in full sentences without difficulty. No obvious swelling.

## 2018-11-07 NOTE — ED Notes (Signed)
Date and time results received: 11/07/18 11:20 PM (use smartphrase ".now" to insert current time)  Test: troponin Critical Value: 0.04  Name of Provider Notified: Lynelle Doctor EDP  Orders Received? Or Actions Taken?: Orders Received - See Orders for details

## 2018-11-08 ENCOUNTER — Encounter (HOSPITAL_COMMUNITY): Payer: Self-pay

## 2018-11-08 DIAGNOSIS — I472 Ventricular tachycardia: Secondary | ICD-10-CM | POA: Diagnosis present

## 2018-11-08 DIAGNOSIS — T502X5A Adverse effect of carbonic-anhydrase inhibitors, benzothiadiazides and other diuretics, initial encounter: Secondary | ICD-10-CM | POA: Diagnosis present

## 2018-11-08 DIAGNOSIS — N179 Acute kidney failure, unspecified: Secondary | ICD-10-CM | POA: Diagnosis present

## 2018-11-08 DIAGNOSIS — F1410 Cocaine abuse, uncomplicated: Secondary | ICD-10-CM | POA: Diagnosis present

## 2018-11-08 DIAGNOSIS — Z79899 Other long term (current) drug therapy: Secondary | ICD-10-CM | POA: Diagnosis not present

## 2018-11-08 DIAGNOSIS — I5021 Acute systolic (congestive) heart failure: Secondary | ICD-10-CM | POA: Diagnosis present

## 2018-11-08 DIAGNOSIS — I5043 Acute on chronic combined systolic (congestive) and diastolic (congestive) heart failure: Secondary | ICD-10-CM | POA: Diagnosis present

## 2018-11-08 DIAGNOSIS — R0602 Shortness of breath: Secondary | ICD-10-CM | POA: Diagnosis present

## 2018-11-08 DIAGNOSIS — Z9111 Patient's noncompliance with dietary regimen: Secondary | ICD-10-CM | POA: Diagnosis not present

## 2018-11-08 DIAGNOSIS — I509 Heart failure, unspecified: Secondary | ICD-10-CM

## 2018-11-08 DIAGNOSIS — I130 Hypertensive heart and chronic kidney disease with heart failure and stage 1 through stage 4 chronic kidney disease, or unspecified chronic kidney disease: Secondary | ICD-10-CM | POA: Diagnosis present

## 2018-11-08 DIAGNOSIS — N182 Chronic kidney disease, stage 2 (mild): Secondary | ICD-10-CM | POA: Diagnosis present

## 2018-11-08 LAB — CBC
HCT: 37.4 % — ABNORMAL LOW (ref 39.0–52.0)
Hemoglobin: 12.1 g/dL — ABNORMAL LOW (ref 13.0–17.0)
MCH: 27.9 pg (ref 26.0–34.0)
MCHC: 32.4 g/dL (ref 30.0–36.0)
MCV: 86.2 fL (ref 80.0–100.0)
Platelets: 293 10*3/uL (ref 150–400)
RBC: 4.34 MIL/uL (ref 4.22–5.81)
RDW: 14.4 % (ref 11.5–15.5)
WBC: 5.5 10*3/uL (ref 4.0–10.5)
nRBC: 0 % (ref 0.0–0.2)

## 2018-11-08 LAB — RAPID URINE DRUG SCREEN, HOSP PERFORMED
Amphetamines: NOT DETECTED
Barbiturates: NOT DETECTED
Benzodiazepines: NOT DETECTED
Cocaine: POSITIVE — AB
Opiates: NOT DETECTED
Tetrahydrocannabinol: NOT DETECTED

## 2018-11-08 LAB — URINALYSIS, ROUTINE W REFLEX MICROSCOPIC
Bilirubin Urine: NEGATIVE
Glucose, UA: NEGATIVE mg/dL
Hgb urine dipstick: NEGATIVE
Ketones, ur: NEGATIVE mg/dL
Leukocytes, UA: NEGATIVE
Nitrite: NEGATIVE
Protein, ur: NEGATIVE mg/dL
Specific Gravity, Urine: 1.005 (ref 1.005–1.030)
pH: 6 (ref 5.0–8.0)

## 2018-11-08 LAB — BASIC METABOLIC PANEL
Anion gap: 10 (ref 5–15)
BUN: 27 mg/dL — ABNORMAL HIGH (ref 6–20)
CO2: 24 mmol/L (ref 22–32)
Calcium: 8.7 mg/dL — ABNORMAL LOW (ref 8.9–10.3)
Chloride: 104 mmol/L (ref 98–111)
Creatinine, Ser: 1.41 mg/dL — ABNORMAL HIGH (ref 0.61–1.24)
GFR calc Af Amer: >60 mL/min (ref >60)
GFR calc non Af Amer: 56 mL/min — ABNORMAL LOW (ref >60)
Glucose, Bld: 103 mg/dL — ABNORMAL HIGH (ref 70–99)
Potassium: 3.8 mmol/L (ref 3.5–5.1)
Sodium: 138 mmol/L (ref 135–145)

## 2018-11-08 LAB — TROPONIN I: Troponin I: 0.04 ng/mL (ref <0.03)

## 2018-11-08 MED ORDER — FUROSEMIDE 10 MG/ML IJ SOLN
40.0000 mg | Freq: Once | INTRAMUSCULAR | Status: AC
  Administered 2018-11-08: 40 mg via INTRAVENOUS
  Filled 2018-11-08: qty 4

## 2018-11-08 MED ORDER — IPRATROPIUM-ALBUTEROL 0.5-2.5 (3) MG/3ML IN SOLN
3.0000 mL | Freq: Three times a day (TID) | RESPIRATORY_TRACT | Status: DC
  Administered 2018-11-08: 3 mL via RESPIRATORY_TRACT
  Filled 2018-11-08: qty 3

## 2018-11-08 MED ORDER — ENOXAPARIN SODIUM 40 MG/0.4ML ~~LOC~~ SOLN
40.0000 mg | SUBCUTANEOUS | Status: DC
  Administered 2018-11-08 – 2018-11-12 (×5): 40 mg via SUBCUTANEOUS
  Filled 2018-11-08 (×5): qty 0.4

## 2018-11-08 MED ORDER — ISOSORB DINITRATE-HYDRALAZINE 20-37.5 MG PO TABS
1.0000 | ORAL_TABLET | Freq: Three times a day (TID) | ORAL | Status: DC
  Administered 2018-11-08 – 2018-11-12 (×14): 1 via ORAL
  Filled 2018-11-08 (×15): qty 1

## 2018-11-08 MED ORDER — CARVEDILOL 3.125 MG PO TABS
3.1250 mg | ORAL_TABLET | Freq: Two times a day (BID) | ORAL | Status: DC
  Administered 2018-11-08 – 2018-11-09 (×3): 3.125 mg via ORAL
  Filled 2018-11-08 (×4): qty 1

## 2018-11-08 MED ORDER — IPRATROPIUM-ALBUTEROL 0.5-2.5 (3) MG/3ML IN SOLN: 3.0000 mL | RESPIRATORY_TRACT | Status: DC | PRN

## 2018-11-08 MED ORDER — BUTALBITAL-APAP-CAFFEINE 50-325-40 MG PO TABS
2.0000 | ORAL_TABLET | Freq: Once | ORAL | Status: AC
  Administered 2018-11-08: 2 via ORAL
  Filled 2018-11-08: qty 2

## 2018-11-08 NOTE — Progress Notes (Signed)
PROGRESS NOTE    Dale [name]  MHD:[id number] DOB: Mar 12, 1961 DOA: 11/07/2018 PCP: [name], FNP    Brief Narrative:  58 y.o. male with hx of polysubstance abuse (alcohol and cocaine), systolic and diastolic CHF and HFrEF (EF 20-25%) suspected due to HTN (no prior ischemic workup), CKD stage 2 who presents with acute dyspnea x 1 day. Patient states that he noted acute episodes of dyspnea while resting in bed the night prior to admission, making him toss and turn. He then noted progressive shortness of breath while ambulating -- acutely dyspneic with walking across the room. Reports dietary non compliance including eating pizza earlier today. Has been having productive cough with clear-whitish sputum over the past few days and subjective lower extremity edema. Denies chest pressure or pain. Patient reports compliance with lasix 40mg  and losartan 50mg  daily, although he states that he has not noticed significant urine output with the lasix. Of note, patient was also started on coreg and amlodipine in November 2019 when he was hospitalized for ADHF and diagnosed with systolic CHF and HFrEF for the first time; however, patient left AMA without filling his complete medication regimen. His UDS was positive on 09/27/18 for cocaine.    Assessment & Plan:   Active Problems:   Acute systolic heart failure (HCC)  # Acute CHF with dyspnea and main presenting symptom.  -Dietary non compliance - EF 20-25% in Nov 2019.  -No prior ischemic workup. 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  - dry weight likely around 105 kg - continue scheduled IV steroids - starting bidil TID on 11/08/17 - resumed low dose coreg 3.125mg  BID for afterload benefit - strict ins/outs; daily weights - fluid and dietary sodium restriction - holding losartan in setting of active diuresis - high goal lyte repletion - troponin repeat in AM  # AKI on CKD stage 2  - likely due to pre-renal cardiorenal congestion -baseline Cr ~  1.2 -Continue IV lasix as tolerated -Repeat bmet in AM  # Hx of polysubstance abuse -cocaine pos -cessation done at bedside. Pt interested in drug program as outpatient  # HTN with BP in 150-160s on admission - needs aggressive BP control especially given reduced EF - cont meds per above   DVT prophylaxis: Lovenox subQ Code Status: Full Family Communication: Pt in room, family not at bedside Disposition Plan: Uncertain at this time  Consultants:     Procedures:     Antimicrobials: Anti-infectives (From admission, onward)   None       Subjective: Complaining of subjective sob  Objective: Vitals:   11/08/18 0649 11/08/18 0804 11/08/18 0919 11/08/18 1309  BP: (!) 153/106 (!) 146/107  127/88  Pulse: 91 84 85 85  Resp: (!) 28 18 17 20   Temp:  98.1 F (36.7 C)  98.2 F (36.8 C)  TempSrc:  Oral    SpO2: 96% 94% 99% 96%  Weight:  103.1 kg    Height:  6\' 1"  (1.854 m)      Intake/Output Summary (Last 24 hours) at 11/08/2018 1558 Last data filed at 11/08/2018 1300 Gross per 24 hour  Intake 360 ml  Output 830 ml  Net -470 ml   Filed Weights   11/08/18 0804  Weight: 103.1 kg    Examination:  General exam: Appears calm and comfortable  Respiratory system: Clear to auscultation Increased resp effort, no wheezing Cardiovascular system: S1 & S2 heard, RRR. Gastrointestinal system: Abdomen is nondistended, soft and nontender. No organomegaly or masses felt. Normal bowel sounds heard.  Central nervous system: Alert and oriented. No focal neurological deficits. Extremities: Symmetric 5 x 5 power. Skin: No rashes, lesions Psychiatry: Judgement and insight appear normal. Mood & affect appropriate.   Data Reviewed: I have personally reviewed following labs and imaging studies  CBC: Recent Labs  Lab 11/07/18 2234 11/08/18 0421  WBC 5.7 5.5  HGB 12.2* 12.1*  HCT 37.8* 37.4*  MCV 86.5 86.2  PLT 337 293   Basic Metabolic Panel: Recent Labs  Lab  11/07/18 2234 11/08/18 0421  NA 137 138  K 3.9 3.8  CL 104 104  CO2 23 24  GLUCOSE 108* 103*  BUN 29* 27*  CREATININE 1.51* 1.41*  CALCIUM 9.1 8.7*   GFR: Estimated Creatinine Clearance: 74.7 mL/min (A) (by C-G formula based on SCr of 1.41 mg/dL (H)). Liver Function Tests: No results for input(s): AST, ALT, ALKPHOS, BILITOT, PROT, ALBUMIN in the last 168 hours. No results for input(s): LIPASE, AMYLASE in the last 168 hours. No results for input(s): AMMONIA in the last 168 hours. Coagulation Profile: No results for input(s): INR, PROTIME in the last 168 hours. Cardiac Enzymes: Recent Labs  Lab 11/07/18 2234 11/08/18 0421  TROPONINI 0.04* 0.04*   BNP (last 3 results) No results for input(s): PROBNP in the last 8760 hours. HbA1C: No results for input(s): HGBA1C in the last 72 hours. CBG: No results for input(s): GLUCAP in the last 168 hours. Lipid Profile: No results for input(s): CHOL, HDL, LDLCALC, TRIG, CHOLHDL, LDLDIRECT in the last 72 hours. Thyroid Function Tests: No results for input(s): TSH, T4TOTAL, FREET4, T3FREE, THYROIDAB in the last 72 hours. Anemia Panel: No results for input(s): VITAMINB12, FOLATE, FERRITIN, TIBC, IRON, RETICCTPCT in the last 72 hours. Sepsis Labs: No results for input(s): PROCALCITON, LATICACIDVEN in the last 168 hours.  No results found for this or any previous visit (from the past 240 hour(s)).   Radiology Studies: Dg Chest 2 View  Result Date: 11/07/2018 CLINICAL DATA:  Patient reports intermittent SOB today. Hx CHF. Reports taking lasix as prescribed. EXAM: CHEST - 2 VIEW COMPARISON:  10/15/2018 FINDINGS: Cardiac silhouette is mildly enlarged. No mediastinal or hilar masses. No evidence of adenopathy. Lungs are clear. Probable minimal right pleural effusion. No pneumothorax. Skeletal structures are intact. IMPRESSION: 1. No acute findings.  No evidence of pneumonia or pulmonary edema. 2. Mild cardiomegaly.  Minimal right pleural  effusion. Electronically Signed   By: Amie Portland M.D.   On: 11/07/2018 19:42    Scheduled Meds: . carvedilol  3.125 mg Oral BID WC  . enoxaparin (LOVENOX) injection  40 mg Subcutaneous Q24H  . isosorbide-hydrALAZINE  1 tablet Oral TID   Continuous Infusions:   LOS: 0 days   Rickey Barbara, MD [facility] Pager On Amion  If 7PM-7AM, please contact night-coverage 11/08/2018, 3:58 PM

## 2018-11-08 NOTE — H&P (Signed)
History and Physical  Dale [name] FMM:[id number] DOB: Dec 13, 1960 DOA: 11/07/2018 2153  Referring physician: Roselyn Bering [facility] ED) PCP: [name], FNP   HISTORY   Chief Complaint: progressive dyspnea  HPI: Dale [name] is a 58 y.o. male with hx of polysubstance abuse (alcohol and cocaine), systolic and diastolic CHF and HFrEF (EF 20-25%) suspected due to HTN (no prior ischemic workup), CKD stage 2 who presents with acute dyspnea x 1 day. Patient states that he noted acute episodes of dyspnea while resting in bed the night prior to admission, making him toss and turn. He then noted progressive shortness of breath while ambulating -- acutely dyspneic with walking across the room. Reports dietary non compliance including eating pizza earlier today. Has been having productive cough with clear-whitish sputum over the past few days and subjective lower extremity edema. Denies chest pressure or pain. Patient reports compliance with lasix 40mg  and losartan 50mg  daily, although he states that he has not noticed significant urine output with the lasix. Of note, patient was also started on coreg and amlodipine in November 2019 when he was hospitalized for ADHF and diagnosed with systolic CHF and HFrEF for the first time; however, patient left AMA without filling his complete medication regimen. His UDS was positive on 09/27/18 for cocaine.    Review of Systems:  + dyspnea + productive cough + lower extremity edema + PND symptoms - no fevers/chills - no chest pain, dyspnea on exertion - no nausea/vomiting; no tarry, melanotic or bloody stools - no dysuria, increased urinary frequency - no weight changes (unknown) Rest of systems reviewed are negative, except as per above history.   ED course:  Vitals Blood pressure (!) 160/119, pulse 90, temperature 98.1 F (36.7 C), temperature source Oral, resp. rate (!) 35, SpO2 99 %. Received IV lasix 40mg  x 1; nitroglycerin paste (1 inch) placed at 00:20  (11/08/18)  Past Medical History:  Diagnosis Date  . Alcohol abuse   . Chronic combined systolic and diastolic CHF (congestive heart failure) (HCC)    a. 09/27/18 showed mild LVH, EF 20-25%, grade 2 DD, mild MR, severely dilated LA, mildly dilated RV with mildly reduced RV function, mod RAE.  Marland Kitchen Cocaine abuse (HCC)   . Hypertension    History reviewed. No pertinent surgical history.  Social History:  reports that he has never smoked. He has never used smokeless tobacco. He reports current alcohol use. He reports current drug use. Frequency: 3.00 times per week. Drugs: Marijuana and Cocaine.  Allergies  Allergen Reactions  . Hydrocodone Itching    No family history on file.    Prior to Admission medications   Medication Sig Start Date End Date Taking? Authorizing Provider  furosemide (LASIX) 40 MG tablet Take 1 tablet (40 mg total) by mouth daily. 10/05/18 01/03/19 Yes Rosalio Macadamia, NP  losartan (COZAAR) 50 MG tablet Take 1 tablet (50 mg total) by mouth daily. 10/05/18 01/03/19 Yes Rosalio Macadamia, NP  potassium chloride SA (K-DUR,KLOR-CON) 20 MEQ tablet Take 1 tablet (20 mEq total) by mouth daily. 10/05/18  Yes Rosalio Macadamia, NP    PHYSICAL EXAM   Temp:  [98.1 F (36.7 C)] 98.1 F (36.7 C) (01/04 1910) Pulse Rate:  [47-95] 90 (01/04 2300) Resp:  [22-35] 35 (01/04 2300) BP: (147-166)/(114-144) 160/119 (01/04 2300) SpO2:  [96 %-99 %] 99 % (01/04 2300)  BP (!) 160/119   Pulse 90   Temp 98.1 F (36.7 C) (Oral)   Resp (!) 35  SpO2 99%    GEN obese middle-aged african-american male; resting in bed, mildly uncomfortable, tachypneic  HEENT NCAT EOM intact PERRL; clear oropharynx, no cervical LAD; moist mucus membranes  JVP estimated 10 cm H2O above RA; + HJR ; no carotid bruits b/l ;  CV regular normal rate; normal S1 and S2; +2/6 systolic murmur at apex; no S3/S4; PMI diffuse; no parasternal heave  RESP  Diminished at bases and bibasilar crackles R > L; breathing labored  with accessory mm use; and symmetric  ABD soft NT ND obese +normoactive BS  EXT warm throughout b/l; 1+ peripheral edema b/l to upper shins  PULSES  DP and radials 2+ intact b/l  SKIN/MSK no rashes or lesions  NEURO/PSYCH AAOx4; no focal deficits   DATA   LABS ON ADMISSION:  Basic Metabolic Panel: Recent Labs  Lab 11/07/18 2234  NA 137  K 3.9  CL 104  CO2 23  GLUCOSE 108*  BUN 29*  CREATININE 1.51*  CALCIUM 9.1   CBC: Recent Labs  Lab 11/07/18 2234  WBC 5.7  HGB 12.2*  HCT 37.8*  MCV 86.5  PLT 337   Liver Function Tests: No results for input(s): AST, ALT, ALKPHOS, BILITOT, PROT, ALBUMIN in the last 168 hours. No results for input(s): LIPASE, AMYLASE in the last 168 hours. No results for input(s): AMMONIA in the last 168 hours. Coagulation:  No results found for: INR, PROTIME No results found for: PTT Lactic Acid, Venous:  No results found for: LATICACIDVEN Cardiac Enzymes: Recent Labs  Lab 11/07/18 2234  TROPONINI 0.04*   Urinalysis: pending  BNP (last 3 results) No results for input(s): PROBNP in the last 8760 hours. CBG: No results for input(s): GLUCAP in the last 168 hours.  Radiological Exams on Admission: Dg Chest 2 View  Result Date: 11/07/2018 CLINICAL DATA:  Patient reports intermittent SOB today. Hx CHF. Reports taking lasix as prescribed. EXAM: CHEST - 2 VIEW COMPARISON:  10/15/2018 FINDINGS: Cardiac silhouette is mildly enlarged. No mediastinal or hilar masses. No evidence of adenopathy. Lungs are clear. Probable minimal right pleural effusion. No pneumothorax. Skeletal structures are intact. IMPRESSION: 1. No acute findings.  No evidence of pneumonia or pulmonary edema. 2. Mild cardiomegaly.  Minimal right pleural effusion. Electronically Signed   By: Amie Portland M.D.   On: 11/07/2018 19:42    EKG: Independently reviewed. NSR, LVH with repolarization abnormalities including TWI laterally. Unchanged from prior in November 2019.   TTE  09/27/2018: Impressions: - Severe global reduction in LV systolic function; mild LVH and   LVE; moderate diastolic dysfunction; trace AI; mild MR; biatrial   enlargement; mild RVE with mild RV dysfunction.  I have reviewed the patient's previous electronic chart records, labs, and other data.   ASSESSMENT AND PLAN   Assessment: Dale [name] is a 58 y.o. 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 Exam notable for bibasilar crackles and tachypnea. BP are elevated at 150-160s systolics. No O2 requirement on admission; CXR shows edema on my review. BNP in 2000s. Of note, patient had left AMA when hospitalized for ADHF in November 2019 when he was first diagnosed with heart failure. 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 No prior ischemic workup. EKG shows TWI likely secondary to LVH (unchanged from prior).   Active Problems:   Acute systolic heart failure (HCC)  Plan:   # Acute CHF with dyspnea and main presenting symptom. Dietary non compliance > EF 20-25% in Nov 2019. No prior ischemic workup. 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  - dry weight likely around 105 kg - s/p lasix IV 40mg  x 1 and nitropaste in ED - ordered lasix IV 40mg  dose in AM - starting bidil TID on 11/08/17 - resumed low dose coreg 3.125mg  BID for afterload benefit - BP goal < 120/80 - if BP room, can also start amlodipine prior to discharge but would favor bidil and coreg and losartan regimen as priority - consider higher lasix or torsemide PO instead of lasix given reported low UOP with lasix 40mg  - strict ins/outs; daily weights - fluid and dietary sodium restriction -  holding losartan in setting of active diuresis - high goal lyte repletion - troponin repeat in AM - duonebs q6h prn - telemetry  # AKI on CKD stage 2 - likely due to pre-renal cardiorenal congestion > baseline Cr ~ 1.2  - diuresis as above; monitor closely  - avoid nephrotoxic agents  # Hx of polysubstance abuse, mainly cocaine - UDS pending - no prior alcohol withdrawal history  # HTN with BP in 150-160s on admission - needs aggressive BP control especially given reduced EF - uptitrate HF meds as above     DVT Prophylaxis: lovenox Code Status:  Full Code Family Communication: patient and wife at bedside  Disposition Plan: admit to telemetry bed for diuresis and med titration  Patient contact: Extended Emergency Contact Information Primary Emergency Contact: Debby Freibergrawford,Dura  [place] Home Phone: [phone] Relation: Mother  Time spent: > 35 mins  Ike Benearolyn J Bascom Biel, MD [facility] Pager [phone]  If 7PM-7AM, please contact night-coverage [email or web address] Password [email or web address] 11/08/2018, 12:33 AM

## 2018-11-08 NOTE — ED Notes (Signed)
ED TO INPATIENT HANDOFF REPORT  Name/Age/Gender Dale [name] 58 y.o. male  Code Status    Code Status Orders  (From admission, onward)         Start     Ordered   11/08/18 0029  Full code  Continuous     11/08/18 0032        Code Status History    Date Active Date Inactive Code Status Order ID Comments User Context   09/27/2018 0826 09/29/2018 1036 Full Code [id number]  Darlin Drop, DO ED      Home/SNF/Other Home  Chief Complaint SOB  Level of Care/Admitting Diagnosis ED Disposition    ED Disposition Condition Comment   Admit  Hospital Area: [facility] [[id number]]  Level of Care: Telemetry [5]  Admit to tele based on following criteria: Acute CHF  Diagnosis: Acute systolic heart failure (HCC) [428.21.ICD-9-CM]  Admitting Physician: Ike Bene [[id number]]  Attending Physician: Ike Bene [[id number]]  Estimated length of stay: past midnight tomorrow  Certification:: I certify this patient will need inpatient services for at least 2 midnights  PT Class (Do Not Modify): Inpatient [101]  PT Acc Code (Do Not Modify): Private [1]       Medical History Past Medical History:  Diagnosis Date  . Alcohol abuse   . Chronic combined systolic and diastolic CHF (congestive heart failure) (HCC)    a. 09/27/18 showed mild LVH, EF 20-25%, grade 2 DD, mild MR, severely dilated LA, mildly dilated RV with mildly reduced RV function, mod RAE.  Marland Kitchen Cocaine abuse (HCC)   . Hypertension     Allergies Allergies  Allergen Reactions  . Hydrocodone Itching    IV Location/Drains/Wounds Patient Lines/Drains/Airways Status   Active Line/Drains/Airways    Name:   Placement date:   Placement time:   Site:   Days:   Peripheral IV 11/07/18 Right Antecubital   11/07/18    2233    Antecubital   1          Labs/Imaging Results for orders placed or performed during the hospital encounter of 11/07/18 (from the past 48 hour(s))  CBC     Status: Abnormal   Collection Time: 11/07/18 10:34 PM  Result Value Ref Range   WBC 5.7 4.0 - 10.5 K/uL   RBC 4.37 4.22 - 5.81 MIL/uL   Hemoglobin 12.2 (L) 13.0 - 17.0 g/dL   HCT 03.8 (L) 88.2 - 80.0 %   MCV 86.5 80.0 - 100.0 fL   MCH 27.9 26.0 - 34.0 pg   MCHC 32.3 30.0 - 36.0 g/dL   RDW 34.9 17.9 - 15.0 %   Platelets 337 150 - 400 K/uL   nRBC 0.0 0.0 - 0.2 %    Comment: Performed at [facility], [address], [place], [place] [address]  Basic metabolic panel     Status: Abnormal   Collection Time: 11/07/18 10:34 PM  Result Value Ref Range   Sodium 137 135 - 145 mmol/L   Potassium 3.9 3.5 - 5.1 mmol/L   Chloride 104 98 - 111 mmol/L   CO2 23 22 - 32 mmol/L   Glucose, Bld 108 (H) 70 - 99 mg/dL   BUN 29 (H) 6 - 20 mg/dL   Creatinine, Ser 4.80 (H) 0.61 - 1.24 mg/dL   Calcium 9.1 8.9 - 16.5 mg/dL   GFR calc non Af Amer 51 (L) >60 mL/min   GFR calc Af Amer 59 (L) >60 mL/min   Anion  gap 10 5 - 15    Comment: Performed at [facility], [address] [place], [place] [address]  Brain natriuretic peptide     Status: Abnormal   Collection Time: 11/07/18 10:34 PM  Result Value Ref Range   B Natriuretic Peptide 2,330.9 (H) 0.0 - 100.0 pg/mL    Comment: Performed at [facility], [address] [place], [place] [address]  Troponin I - ONCE - STAT     Status: Abnormal   Collection Time: 11/07/18 10:34 PM  Result Value Ref Range   Troponin I 0.04 (HH) <0.03 ng/mL    Comment: CRITICAL RESULT CALLED TO, READ BACK BY AND VERIFIED WITH: DOSTER,S AT 2319 ON 11/07/2018 BY MOSLEY,J Performed at [facility], [address] [place], [place] [address]   Urine rapid drug screen (hosp performed)     Status: Abnormal   Collection Time: 11/08/18  4:19 AM  Result Value Ref Range   Opiates NONE DETECTED NONE DETECTED   Cocaine POSITIVE (A) NONE DETECTED   Benzodiazepines NONE DETECTED NONE DETECTED   Amphetamines NONE DETECTED NONE  DETECTED   Tetrahydrocannabinol NONE DETECTED NONE DETECTED   Barbiturates NONE DETECTED NONE DETECTED    Comment: (NOTE) DRUG SCREEN FOR MEDICAL PURPOSES ONLY.  IF CONFIRMATION IS NEEDED FOR ANY PURPOSE, NOTIFY LAB WITHIN 5 DAYS. LOWEST DETECTABLE LIMITS FOR URINE DRUG SCREEN Drug Class                     Cutoff (ng/mL) Amphetamine and metabolites    1000 Barbiturate and metabolites    200 Benzodiazepine                 200 Tricyclics and metabolites     300 Opiates and metabolites        300 Cocaine and metabolites        300 THC                            50 Performed at [facility], [address] [place], [place] [address]   Urinalysis, Routine w reflex microscopic     Status: Abnormal   Collection Time: 11/08/18  4:19 AM  Result Value Ref Range   Color, Urine STRAW (A) YELLOW   APPearance CLEAR CLEAR   Specific Gravity, Urine 1.005 1.005 - 1.030   pH 6.0 5.0 - 8.0   Glucose, UA NEGATIVE NEGATIVE mg/dL   Hgb urine dipstick NEGATIVE NEGATIVE   Bilirubin Urine NEGATIVE NEGATIVE   Ketones, ur NEGATIVE NEGATIVE mg/dL   Protein, ur NEGATIVE NEGATIVE mg/dL   Nitrite NEGATIVE NEGATIVE   Leukocytes, UA NEGATIVE NEGATIVE    Comment: Performed at [facility], [address] [place], [place] [address]  Basic metabolic panel     Status: Abnormal   Collection Time: 11/08/18  4:21 AM  Result Value Ref Range   Sodium 138 135 - 145 mmol/L   Potassium 3.8 3.5 - 5.1 mmol/L   Chloride 104 98 - 111 mmol/L   CO2 24 22 - 32 mmol/L   Glucose, Bld 103 (H) 70 - 99 mg/dL   BUN 27 (H) 6 - 20 mg/dL   Creatinine, Ser 6.601.41 (H) 0.61 - 1.24 mg/dL   Calcium 8.7 (L) 8.9 - 10.3 mg/dL   GFR calc non Af Amer 56 (L) >60 mL/min   GFR calc Af Amer >60 >60 mL/min   Anion  gap 10 5 - 15    Comment: Performed at [facility], [address] [place], [place] [address]  CBC     Status: Abnormal   Collection Time: 11/08/18  4:21 AM  Result  Value Ref Range   WBC 5.5 4.0 - 10.5 K/uL   RBC 4.34 4.22 - 5.81 MIL/uL   Hemoglobin 12.1 (L) 13.0 - 17.0 g/dL   HCT 70.9 (L) 64.3 - 83.8 %   MCV 86.2 80.0 - 100.0 fL   MCH 27.9 26.0 - 34.0 pg   MCHC 32.4 30.0 - 36.0 g/dL   RDW 18.4 03.7 - 54.3 %   Platelets 293 150 - 400 K/uL   nRBC 0.0 0.0 - 0.2 %    Comment: Performed at [facility], [address] [place], [place] [address]  Troponin I - Once     Status: Abnormal   Collection Time: 11/08/18  4:21 AM  Result Value Ref Range   Troponin I 0.04 (HH) <0.03 ng/mL    Comment: CRITICAL VALUE NOTED.  VALUE IS CONSISTENT WITH PREVIOUSLY REPORTED AND CALLED VALUE. Performed at [facility], [address] [place], [place] [address]    Dg Chest 2 View  Result Date: 11/07/2018 CLINICAL DATA:  Patient reports intermittent SOB today. Hx CHF. Reports taking lasix as prescribed. EXAM: CHEST - 2 VIEW COMPARISON:  10/15/2018 FINDINGS: Cardiac silhouette is mildly enlarged. No mediastinal or hilar masses. No evidence of adenopathy. Lungs are clear. Probable minimal right pleural effusion. No pneumothorax. Skeletal structures are intact. IMPRESSION: 1. No acute findings.  No evidence of pneumonia or pulmonary edema. 2. Mild cardiomegaly.  Minimal right pleural effusion. Electronically Signed   By: Amie Portland M.D.   On: 11/07/2018 19:42   EKG Interpretation  Date/Time:  Saturday November 07 2018 19:17:58 EST Ventricular Rate:  92 PR Interval:    QRS Duration: 96 QT Interval:  384 QTC Calculation: 475 R Axis:   44 Text Interpretation:  Sinus rhythm Probable left atrial enlargement Left ventricular hypertrophy Nonspecific T abnrm, anterolateral leads No significant change since last tracing Confirmed by Linwood Dibbles [phone]) on 11/07/2018 10:17:10 PM   Pending Labs Unresulted Labs (From admission, onward)    Start     Ordered   11/15/18 0500  Creatinine, serum  (enoxaparin (LOVENOX)    CrCl >/= 30 ml/min)   Weekly,   R    Comments:  while on enoxaparin therapy    11/08/18 0032   11/09/18 0500  Magnesium  Daily,   R     11/08/18 0032   11/08/18 0500  Basic metabolic panel  Daily,   R     11/08/18 0032   11/08/18 0500  CBC  Daily,   R     11/08/18 0032   11/08/18 0028  CBC  (enoxaparin (LOVENOX)    CrCl >/= 30 ml/min)  Once,   R    Comments:  Baseline for enoxaparin therapy IF NOT ALREADY DRAWN.  Notify MD if PLT < 100 K.    11/08/18 0032   11/08/18 0028  Creatinine, serum  (enoxaparin (LOVENOX)    CrCl >/= 30 ml/min)  Once,   R    Comments:  Baseline for enoxaparin therapy IF NOT ALREADY DRAWN.    11/08/18 0032          Vitals/Pain Today's Vitals   11/08/18 0000 11/08/18 0100 11/08/18 0607 11/08/18 0649  BP: (!) 157/115 (!) 176/135  (!) 153/106  Pulse:  88 91  91  Resp: 11 (!) 27  (!) 28  Temp:      TempSrc:      SpO2: 98% 100%  96%  PainSc:   Asleep     Isolation Precautions No active isolations  Medications Medications  enoxaparin (LOVENOX) injection 40 mg (has no administration in time range)  isosorbide-hydrALAZINE (BIDIL) 20-37.5 MG per tablet 1 tablet (has no administration in time range)  carvedilol (COREG) tablet 3.125 mg (has no administration in time range)  ipratropium-albuterol (DUONEB) 0.5-2.5 (3) MG/3ML nebulizer solution 3 mL (has no administration in time range)  furosemide (LASIX) injection 40 mg (40 mg Intravenous Given 11/08/18 0019)  nitroGLYCERIN (NITROGLYN) 2 % ointment 1 inch (1 inch Topical Given 11/08/18 0020)  furosemide (LASIX) injection 40 mg (40 mg Intravenous Given 11/08/18 0648)  butalbital-acetaminophen-caffeine (FIORICET, ESGIC) 50-325-40 MG per tablet 2 tablet (2 tablets Oral Given 11/08/18 0504)    Mobility walks with person assist

## 2018-11-08 NOTE — Progress Notes (Signed)
Central Telemetry alerted this RN that the patient had 8 beat run of VTach. Went into assess patient, he is resting with eyes closed, nonlabored breathing, continuous pulse ox 98%. Patient is in NSR. Will monitor.

## 2018-11-09 ENCOUNTER — Encounter (HOSPITAL_COMMUNITY): Payer: Self-pay | Admitting: Cardiology

## 2018-11-09 DIAGNOSIS — I429 Cardiomyopathy, unspecified: Secondary | ICD-10-CM

## 2018-11-09 DIAGNOSIS — I5023 Acute on chronic systolic (congestive) heart failure: Secondary | ICD-10-CM

## 2018-11-09 DIAGNOSIS — I472 Ventricular tachycardia: Secondary | ICD-10-CM

## 2018-11-09 LAB — MAGNESIUM: Magnesium: 2.1 mg/dL (ref 1.7–2.4)

## 2018-11-09 LAB — CBC
HCT: 35 % — ABNORMAL LOW (ref 39.0–52.0)
Hemoglobin: 11.4 g/dL — ABNORMAL LOW (ref 13.0–17.0)
MCH: 28.6 pg (ref 26.0–34.0)
MCHC: 32.6 g/dL (ref 30.0–36.0)
MCV: 87.9 fL (ref 80.0–100.0)
Platelets: 288 10*3/uL (ref 150–400)
RBC: 3.98 MIL/uL — ABNORMAL LOW (ref 4.22–5.81)
RDW: 14.5 % (ref 11.5–15.5)
WBC: 5.5 10*3/uL (ref 4.0–10.5)
nRBC: 0 % (ref 0.0–0.2)

## 2018-11-09 LAB — BASIC METABOLIC PANEL
Anion gap: 9 (ref 5–15)
BUN: 30 mg/dL — ABNORMAL HIGH (ref 6–20)
CO2: 25 mmol/L (ref 22–32)
Calcium: 8.6 mg/dL — ABNORMAL LOW (ref 8.9–10.3)
Chloride: 104 mmol/L (ref 98–111)
Creatinine, Ser: 1.47 mg/dL — ABNORMAL HIGH (ref 0.61–1.24)
GFR calc Af Amer: >60 mL/min (ref >60)
GFR calc non Af Amer: 53 mL/min — ABNORMAL LOW (ref >60)
Glucose, Bld: 101 mg/dL — ABNORMAL HIGH (ref 70–99)
Potassium: 3.7 mmol/L (ref 3.5–5.1)
Sodium: 138 mmol/L (ref 135–145)

## 2018-11-09 MED ORDER — HYDRALAZINE HCL 20 MG/ML IJ SOLN: 5.0000 mg | INTRAMUSCULAR | Status: DC | PRN

## 2018-11-09 MED ORDER — CARVEDILOL 6.25 MG PO TABS
6.2500 mg | ORAL_TABLET | Freq: Two times a day (BID) | ORAL | Status: DC
  Administered 2018-11-09 – 2018-11-12 (×6): 6.25 mg via ORAL
  Filled 2018-11-09 (×6): qty 1

## 2018-11-09 MED ORDER — LOSARTAN POTASSIUM 50 MG PO TABS
50.0000 mg | ORAL_TABLET | Freq: Every day | ORAL | Status: DC
  Administered 2018-11-09: 50 mg via ORAL
  Filled 2018-11-09: qty 1

## 2018-11-09 MED ORDER — LOSARTAN POTASSIUM 25 MG PO TABS
25.0000 mg | ORAL_TABLET | Freq: Every day | ORAL | Status: DC
  Administered 2018-11-10: 25 mg via ORAL
  Filled 2018-11-09: qty 1

## 2018-11-09 MED ORDER — FUROSEMIDE 10 MG/ML IJ SOLN
40.0000 mg | Freq: Two times a day (BID) | INTRAMUSCULAR | Status: DC
  Administered 2018-11-09 – 2018-11-11 (×5): 40 mg via INTRAVENOUS
  Filled 2018-11-09 (×5): qty 4

## 2018-11-09 MED ORDER — TRAMADOL HCL 50 MG PO TABS
50.0000 mg | ORAL_TABLET | Freq: Two times a day (BID) | ORAL | Status: DC | PRN
  Administered 2018-11-09 (×2): 50 mg via ORAL
  Filled 2018-11-09 (×2): qty 1

## 2018-11-09 MED ORDER — ACETAMINOPHEN 325 MG PO TABS: 650.0000 mg | ORAL_TABLET | Freq: Four times a day (QID) | ORAL | Status: DC | PRN

## 2018-11-09 NOTE — Progress Notes (Signed)
PROGRESS NOTE    Dale [name]  OYD:[id number] DOB: 04/11/1961 DOA: 11/07/2018 PCP: [name], FNP    Brief Narrative:  58 y.o. male with hx of polysubstance abuse (alcohol and cocaine), systolic and diastolic CHF and HFrEF (EF 20-25%) suspected due to HTN (no prior ischemic workup), CKD stage 2 who presents with acute dyspnea x 1 day. Patient states that he noted acute episodes of dyspnea while resting in bed the night prior to admission, making him toss and turn. He then noted progressive shortness of breath while ambulating -- acutely dyspneic with walking across the room. Reports dietary non compliance including eating pizza earlier today. Has been having productive cough with clear-whitish sputum over the past few days and subjective lower extremity edema. Denies chest pressure or pain. Patient reports compliance with lasix 40mg  and losartan 50mg  daily, although he states that he has not noticed significant urine output with the lasix. Of note, patient was also started on coreg and amlodipine in November 2019 when he was hospitalized for ADHF and diagnosed with systolic CHF and HFrEF for the first time; however, patient left AMA without filling his complete medication regimen. His UDS was positive on 09/27/18 for cocaine.    Assessment & Plan:   Active Problems:   Acute systolic heart failure (HCC)  # Acute CHF with dyspnea and main presenting symptom.  -Dietary non compliance - EF 20-25% in Nov 2019.  -No prior ischemic workup. 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  - dry weight likely around 105 kg - continued on scheduled IV steroids - continue bidil TID on 11/08/17 - continued on coreg, dose increased from  3.125mg  BID  To 6.25mg  per below - strict ins/outs; daily weights - fluid and dietary sodium restriction - Losartan resumed per Cardiology - given severe heart failure and vtach overnight, had consulted Cardiology. Appreciate input - Recommend repeat echo in 3 months and if EF still  reduced, then consideration for ICD  # AKI on CKD stage 2  - likely due to pre-renal cardiorenal congestion -baseline Cr ~ 1.2 -Continue IV lasix as tolerated -recheck bmet in AM. Stable  # Hx of polysubstance abuse -cocaine pos -cessation done at bedside. Pt interested in drug program as outpatient -consulted CM  # HTN with BP in 150-160s on admission - needs aggressive BP control especially given reduced EF - cont meds per above  # Vtach -Nonstustained vtach noted overnight -Coreg increased to 6.25mg  bid per Cardiology -Stable at present   DVT prophylaxis: Lovenox subQ Code Status: Full Family Communication: Pt in room, family not at bedside Disposition Plan: Uncertain at this time  Consultants:   Cardiology  Procedures:     Antimicrobials: Anti-infectives (From admission, onward)   None      Subjective: Still complaining of mild sob  Objective: Vitals:   11/08/18 2043 11/09/18 0627 11/09/18 1130 11/09/18 1311  BP: (!) 127/92 (!) 148/113 (!) 127/96 (!) 128/96  Pulse: 89 89 86 84  Resp: 18 18  20   Temp: 97.6 F (36.4 C) 97.9 F (36.6 C)  97.9 F (36.6 C)  TempSrc: Oral Oral  Oral  SpO2: 98% 97%  100%  Weight:  104.1 kg    Height:        Intake/Output Summary (Last 24 hours) at 11/09/2018 1504 Last data filed at 11/09/2018 1100 Gross per 24 hour  Intake -  Output 1200 ml  Net -1200 ml   Filed Weights   11/08/18 0804 11/09/18 0627  Weight: 103.1 kg 104.1 kg  Examination: General exam: Awake, laying in bed, in nad Respiratory system: Normal respiratory effort, no wheezing Cardiovascular system: regular rate, s1, s2 Gastrointestinal system: Soft, nondistended, positive BS Central nervous system: CN2-12 grossly intact, strength intact Extremities: Perfused, no clubbing Skin: Normal skin turgor, no notable skin lesions seen Psychiatry: Mood normal // no visual hallucinations   Data Reviewed: I have personally reviewed following labs and  imaging studies  CBC: Recent Labs  Lab 11/07/18 2234 11/08/18 0421 11/09/18 0457  WBC 5.7 5.5 5.5  HGB 12.2* 12.1* 11.4*  HCT 37.8* 37.4* 35.0*  MCV 86.5 86.2 87.9  PLT 337 293 288   Basic Metabolic Panel: Recent Labs  Lab 11/07/18 2234 11/08/18 0421 11/09/18 0457  NA 137 138 138  K 3.9 3.8 3.7  CL 104 104 104  CO2 23 24 25   GLUCOSE 108* 103* 101*  BUN 29* 27* 30*  CREATININE 1.51* 1.41* 1.47*  CALCIUM 9.1 8.7* 8.6*  MG  --   --  2.1   GFR: Estimated Creatinine Clearance: 72 mL/min (A) (by C-G formula based on SCr of 1.47 mg/dL (H)). Liver Function Tests: No results for input(s): AST, ALT, ALKPHOS, BILITOT, PROT, ALBUMIN in the last 168 hours. No results for input(s): LIPASE, AMYLASE in the last 168 hours. No results for input(s): AMMONIA in the last 168 hours. Coagulation Profile: No results for input(s): INR, PROTIME in the last 168 hours. Cardiac Enzymes: Recent Labs  Lab 11/07/18 2234 11/08/18 0421  TROPONINI 0.04* 0.04*   BNP (last 3 results) No results for input(s): PROBNP in the last 8760 hours. HbA1C: No results for input(s): HGBA1C in the last 72 hours. CBG: No results for input(s): GLUCAP in the last 168 hours. Lipid Profile: No results for input(s): CHOL, HDL, LDLCALC, TRIG, CHOLHDL, LDLDIRECT in the last 72 hours. Thyroid Function Tests: No results for input(s): TSH, T4TOTAL, FREET4, T3FREE, THYROIDAB in the last 72 hours. Anemia Panel: No results for input(s): VITAMINB12, FOLATE, FERRITIN, TIBC, IRON, RETICCTPCT in the last 72 hours. Sepsis Labs: No results for input(s): PROCALCITON, LATICACIDVEN in the last 168 hours.  No results found for this or any previous visit (from the past 240 hour(s)).   Radiology Studies: Dg Chest 2 View  Result Date: 11/07/2018 CLINICAL DATA:  Patient reports intermittent SOB today. Hx CHF. Reports taking lasix as prescribed. EXAM: CHEST - 2 VIEW COMPARISON:  10/15/2018 FINDINGS: Cardiac silhouette is mildly  enlarged. No mediastinal or hilar masses. No evidence of adenopathy. Lungs are clear. Probable minimal right pleural effusion. No pneumothorax. Skeletal structures are intact. IMPRESSION: 1. No acute findings.  No evidence of pneumonia or pulmonary edema. 2. Mild cardiomegaly.  Minimal right pleural effusion. Electronically Signed   By: Amie Portland M.D.   On: 11/07/2018 19:42    Scheduled Meds: . carvedilol  6.25 mg Oral BID WC  . enoxaparin (LOVENOX) injection  40 mg Subcutaneous Q24H  . furosemide  40 mg Intravenous BID  . isosorbide-hydrALAZINE  1 tablet Oral TID  . losartan  50 mg Oral Daily   Continuous Infusions:   LOS: 1 day   Rickey Barbara, MD [facility] Pager On Amion  If 7PM-7AM, please contact night-coverage 11/09/2018, 3:04 PM

## 2018-11-09 NOTE — Progress Notes (Signed)
Pt had 7 beat run of vtach. VSS, pt resting comfortably in bed, asymptomatic. NP on call notified. Will continue to monitor.

## 2018-11-09 NOTE — Consult Note (Signed)
Cardiology Consultation:   Patient ID: Dale [name] MRN: [id number]; DOB: 11-25-1960  Admit date: 11/07/2018 Date of Consult: 11/09/2018  Primary Care Provider: [name], FNP Primary Cardiologist: Kristeen Miss, MD    Patient Profile:   Dale [name] is a 58 y.o. male with a hx of substance abuse, chronic systolic congestive heart failure, hypertension, chronic stage II kidney disease who is being seen today for the evaluation of acute on chronic systolic congestive heart failure at the request of Rickey Barbara MD.  History of Present Illness:   Patient admitted with uncontrolled hypertension and substance abuse November 2019.  Echocardiogram showed ejection fraction 25%.  Patient was treated medically.  Left AMA during that admission.  Patient complains of increasing dyspnea over the past 2 days.  There is dyspnea on exertion and orthopnea.  There was mild pedal edema on admission by report.  No chest pain, fevers, chills, productive cough or hemoptysis.  Last used cocaine 2 weeks ago.  Patient states he is taking his medications but is not compliant with low-sodium diet.  Eats [employer].  Past Medical History:  Diagnosis Date  . Alcohol abuse   . Chronic combined systolic and diastolic CHF (congestive heart failure) (HCC)    a. 09/27/18 showed mild LVH, EF 20-25%, grade 2 DD, mild MR, severely dilated LA, mildly dilated RV with mildly reduced RV function, mod RAE.  Marland Kitchen Cocaine abuse (HCC)   . Hypertension     History reviewed. No pertinent surgical history.   Inpatient Medications: Scheduled Meds: . carvedilol  3.125 mg Oral BID WC  . enoxaparin (LOVENOX) injection  40 mg Subcutaneous Q24H  . furosemide  40 mg Intravenous BID  . isosorbide-hydrALAZINE  1 tablet Oral TID   Continuous Infusions:  PRN Meds: acetaminophen, ipratropium-albuterol, traMADol  Allergies:    Allergies  Allergen Reactions  . Hydrocodone Itching    Social History:   Social History    Socioeconomic History  . Marital status: Single    Spouse name: Not on file  . Number of children: Not on file  . Years of education: Not on file  . Highest education level: Not on file  Occupational History  . Not on file  Social Needs  . Financial resource strain: Not on file  . Food insecurity:    Worry: Not on file    Inability: Not on file  . Transportation needs:    Medical: Not on file    Non-medical: Not on file  Tobacco Use  . Smoking status: Never Smoker  . Smokeless tobacco: Never Used  Substance and Sexual Activity  . Alcohol use: Yes    Comment: 2 quarts a week   . Drug use: Yes    Frequency: 3.0 times per week    Types: Marijuana, Cocaine  . Sexual activity: Not on file  Lifestyle  . Physical activity:    Days per week: Not on file    Minutes per session: Not on file  . Stress: Not on file  Relationships  . Social connections:    Talks on phone: Not on file    Gets together: Not on file    Attends religious service: Not on file    Active member of club or organization: Not on file    Attends meetings of clubs or organizations: Not on file    Relationship status: Not on file  . Intimate partner violence:    Fear of current or ex partner: Not on file    Emotionally  abused: Not on file    Physically abused: Not on file    Forced sexual activity: Not on file  Other Topics Concern  . Not on file  Social History Narrative  . Not on file    Family History:    Family History  Problem Relation Age of Onset  . Hypertension Maternal Grandmother      ROS:  Please see the history of present illness.  Denies fevers, chills, productive cough, hemoptysis or melena.  Occasional palpitations but no syncope. All other ROS reviewed and negative.     Physical Exam/Data:   Vitals:   11/08/18 1737 11/08/18 2043 11/09/18 0627 11/09/18 1130  BP: 118/86 (!) 127/92 (!) 148/113 (!) 127/96  Pulse: 87 89 89 86  Resp:  18 18   Temp:  97.6 F (36.4 C) 97.9 F  (36.6 C)   TempSrc:  Oral Oral   SpO2:  98% 97%   Weight:   104.1 kg   Height:        Intake/Output Summary (Last 24 hours) at 11/09/2018 1225 Last data filed at 11/09/2018 1100 Gross per 24 hour  Intake 240 ml  Output 1550 ml  Net -1310 ml   Filed Weights   11/08/18 0804 11/09/18 0627  Weight: 103.1 kg 104.1 kg   Body mass index is 30.27 kg/m.  General:  Well nourished, well developed, in no acute distress HEENT: normal Lymph: no adenopathy Neck: no JVD Endocrine:  No thryomegaly Vascular: No carotid bruits; FA pulses 2+ bilaterally without bruits  Cardiac:  normal S1, S2; RRR; no murmur Lungs:  clear to auscultation bilaterally, no wheezing, rhonchi or rales  Abd: soft, nontender, no hepatomegaly  Ext: no edema Musculoskeletal:  No deformities, BUE and BLE strength normal and equal Skin: warm and dry  Neuro:  CNs 2-12 intact, no focal abnormalities noted Psych:  Normal affect   EKG:  The EKG was personally reviewed and demonstrates: Sinus rhythm, left atrial enlargement, left ventricular hypertrophy with repolarization abnormality. Telemetry:  Telemetry was personally reviewed and demonstrates: Sinus rhythm with nonsustained ventricular tachycardia.  Laboratory Data:  Chemistry Recent Labs  Lab 11/07/18 2234 11/08/18 0421 11/09/18 0457  NA 137 138 138  K 3.9 3.8 3.7  CL 104 104 104  CO2 23 24 25   GLUCOSE 108* 103* 101*  BUN 29* 27* 30*  CREATININE 1.51* 1.41* 1.47*  CALCIUM 9.1 8.7* 8.6*  GFRNONAA 51* 56* 53*  GFRAA 59* >60 >60  ANIONGAP 10 10 9     Recent Labs  Lab 11/07/18 2234 11/08/18 0421 11/09/18 0457  WBC 5.7 5.5 5.5  RBC 4.37 4.34 3.98*  HGB 12.2* 12.1* 11.4*  HCT 37.8* 37.4* 35.0*  MCV 86.5 86.2 87.9  MCH 27.9 27.9 28.6  MCHC 32.3 32.4 32.6  RDW 14.5 14.4 14.5  PLT 337 293 288   Cardiac Enzymes Recent Labs  Lab 11/07/18 2234 11/08/18 0421  TROPONINI 0.04* 0.04*   BNP Recent Labs  Lab 11/07/18 2234  BNP 2,330.9*      Radiology/Studies:  Dg Chest 2 View  Result Date: 11/07/2018 CLINICAL DATA:  Patient reports intermittent SOB today. Hx CHF. Reports taking lasix as prescribed. EXAM: CHEST - 2 VIEW COMPARISON:  10/15/2018 FINDINGS: Cardiac silhouette is mildly enlarged. No mediastinal or hilar masses. No evidence of adenopathy. Lungs are clear. Probable minimal right pleural effusion. No pneumothorax. Skeletal structures are intact. IMPRESSION: 1. No acute findings.  No evidence of pneumonia or pulmonary edema. 2. Mild cardiomegaly.  Minimal right  pleural effusion. Electronically Signed   By: Amie Portland M.D.   On: 11/07/2018 19:42    Assessment and Plan:   1. Acute on chronic systolic congestive heart failure-patient presents with CHF symptoms.  LV function severely reduced on previous echocardiogram.  Etiology unclear but felt potentially related to alcohol or hypertension.  Plan to continue Lasix 40 mg IV twice daily and follow renal function.  He needs low-sodium diet and fluid restriction to 1 L daily. 2. Cardiomyopathy-etiology unclear.  Previously felt secondary to cocaine abuse, alcohol use or hypertension.  I will increase carvedilol to 6.25 mg twice daily.  Resume losartan 50 mg daily.  Continue BiDil.  I do not think given social history he will be able to afford or comply with Entresto.  If he can avoid cocaine and be compliant with medications then would need repeat echocardiogram 3 months after medications fully titrated.  If ejection fraction remains reduced then would need ischemia evaluation and could consider ICD at that point but presently not a candidate given recurrent cocaine use. 3. Nonsustained ventricular tachycardia-noted on telemetry.  Increase carvedilol to 6.25 mg twice daily. 4. Chronic stage III kidney disease-follow renal function closely with diuresis and resumption of losartan. 5. Hypertension-blood pressure elevated.  Medications as outlined above. 6. Cocaine abuse-patient  counseled on avoiding. 7. Minimally elevated troponin-no clear trend and no chest pain.  We will not pursue further ischemia evaluation at this point.  Not consistent with acute coronary syndrome.   For questions or updates, please contact [facility] Please consult [email or web address] for contact info under     Signed, Olga Millers, MD  11/09/2018 12:25 PM

## 2018-11-10 LAB — BASIC METABOLIC PANEL
Anion gap: 9 (ref 5–15)
BUN: 29 mg/dL — ABNORMAL HIGH (ref 6–20)
CO2: 27 mmol/L (ref 22–32)
Calcium: 8.9 mg/dL (ref 8.9–10.3)
Chloride: 105 mmol/L (ref 98–111)
Creatinine, Ser: 1.41 mg/dL — ABNORMAL HIGH (ref 0.61–1.24)
GFR calc Af Amer: >60 mL/min (ref >60)
GFR calc non Af Amer: 56 mL/min — ABNORMAL LOW (ref >60)
Glucose, Bld: 94 mg/dL (ref 70–99)
Potassium: 3.7 mmol/L (ref 3.5–5.1)
Sodium: 141 mmol/L (ref 135–145)

## 2018-11-10 LAB — CBC
HCT: 36.3 % — ABNORMAL LOW (ref 39.0–52.0)
Hemoglobin: 11.9 g/dL — ABNORMAL LOW (ref 13.0–17.0)
MCH: 28.1 pg (ref 26.0–34.0)
MCHC: 32.8 g/dL (ref 30.0–36.0)
MCV: 85.8 fL (ref 80.0–100.0)
Platelets: 298 10*3/uL (ref 150–400)
RBC: 4.23 MIL/uL (ref 4.22–5.81)
RDW: 14.6 % (ref 11.5–15.5)
WBC: 5.4 10*3/uL (ref 4.0–10.5)
nRBC: 0 % (ref 0.0–0.2)

## 2018-11-10 LAB — MAGNESIUM: Magnesium: 2.1 mg/dL (ref 1.7–2.4)

## 2018-11-10 MED ORDER — LOSARTAN POTASSIUM 50 MG PO TABS
50.0000 mg | ORAL_TABLET | Freq: Every day | ORAL | Status: DC
  Administered 2018-11-11 – 2018-11-12 (×2): 50 mg via ORAL
  Filled 2018-11-10 (×2): qty 1

## 2018-11-10 NOTE — Progress Notes (Signed)
PROGRESS NOTE    Dale [name]  OHY:[id number] DOB: 19-Dec-1960 DOA: 11/07/2018 PCP: [name], FNP    Brief Narrative:  58 y.o. male with hx of polysubstance abuse (alcohol and cocaine), systolic and diastolic CHF and HFrEF (EF 20-25%) suspected due to HTN (no prior ischemic workup), CKD stage 2 who presents with acute dyspnea x 1 day. Patient states that he noted acute episodes of dyspnea while resting in bed the night prior to admission, making him toss and turn. He then noted progressive shortness of breath while ambulating -- acutely dyspneic with walking across the room. Reports dietary non compliance including eating pizza earlier today. Has been having productive cough with clear-whitish sputum over the past few days and subjective lower extremity edema. Denies chest pressure or pain. Patient reports compliance with lasix 40mg  and losartan 50mg  daily, although he states that he has not noticed significant urine output with the lasix. Of note, patient was also started on coreg and amlodipine in November 2019 when he was hospitalized for ADHF and diagnosed with systolic CHF and HFrEF for the first time; however, patient left AMA without filling his complete medication regimen. His UDS was positive on 09/27/18 for cocaine.    Assessment & Plan:   Active Problems:   Acute systolic heart failure (HCC)  # Acute CHF with dyspnea and main presenting symptom.  -Dietary non compliance - EF 20-25% in Nov 2019.  -No prior ischemic workup. 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  - dry weight likely around 105 kg - continued on scheduled IV steroids - continue bidil TID on 11/08/17 - continued on coreg 6.25mg , cozaar, imdur per below - strict ins/outs; daily weights - fluid and dietary sodium restriction - Losartan resumed per Cardiology - given severe heart failure and vtach overnight, had consulted Cardiology. Appreciate input - Recommend repeat echo in 3 months and if EF still reduced, then consideration  for ICD -Cardiology recommends to continue current IV lasix regimen. Consideration for torsemide at time of discharge  # AKI on CKD stage 2  - likely due to pre-renal cardiorenal congestion -baseline Cr ~ 1.2 -Continue IV lasix as tolerated -recheck bmet in AM. Remains stable  # Hx of polysubstance abuse -cocaine pos -cessation done at bedside. Pt seems very interested in stopping his drug abuse and seems motivated to "step on the brakes," in reference to ending his self-destructive behaviors -consulted CM  # HTN with BP in 150-160s on admission - needs aggressive BP control especially given reduced EF - cont with meds as per above  # Vtach -several beats of vtach noted overnight -Coreg continued at 6.25mg  bid per Cardiology -Stable at present   DVT prophylaxis: Lovenox subQ Code Status: Full Family Communication: Pt in room, family not at bedside Disposition Plan: Uncertain at this time  Consultants:   Cardiology  Procedures:     Antimicrobials: Anti-infectives (From admission, onward)   None      Subjective: Feeling better today. Headaches last night  Objective: Vitals:   11/10/18 0705 11/10/18 0817 11/10/18 0950 11/10/18 1302  BP:   121/81 124/84  Pulse:  88 85 83  Resp:    18  Temp:    97.9 F (36.6 C)  TempSrc:    Oral  SpO2:   93% 100%  Weight: 104.6 kg     Height:        Intake/Output Summary (Last 24 hours) at 11/10/2018 1348 Last data filed at 11/10/2018 0951 Gross per 24 hour  Intake 360 ml  Output 2350 ml  Net -1990 ml   Filed Weights   11/08/18 0804 11/09/18 0627 11/10/18 0705  Weight: 103.1 kg 104.1 kg 104.6 kg    Examination: General exam: Conversant, in no acute distress Respiratory system: normal chest rise, clear, no audible wheezing Cardiovascular system: regular rhythm, s1-s2 Gastrointestinal system: Nondistended, nontender, pos BS Central nervous system: No seizures, no tremors Extremities: No cyanosis, no joint  deformities Skin: No rashes, no pallor Psychiatry: Affect normal // no auditory hallucinations   Data Reviewed: I have personally reviewed following labs and imaging studies  CBC: Recent Labs  Lab 11/07/18 2234 11/08/18 0421 11/09/18 0457 11/10/18 0425  WBC 5.7 5.5 5.5 5.4  HGB 12.2* 12.1* 11.4* 11.9*  HCT 37.8* 37.4* 35.0* 36.3*  MCV 86.5 86.2 87.9 85.8  PLT 337 293 288 298   Basic Metabolic Panel: Recent Labs  Lab 11/07/18 2234 11/08/18 0421 11/09/18 0457 11/10/18 0425  NA 137 138 138 141  K 3.9 3.8 3.7 3.7  CL 104 104 104 105  CO2 23 24 25 27   GLUCOSE 108* 103* 101* 94  BUN 29* 27* 30* 29*  CREATININE 1.51* 1.41* 1.47* 1.41*  CALCIUM 9.1 8.7* 8.6* 8.9  MG  --   --  2.1 2.1   GFR: Estimated Creatinine Clearance: 75.2 mL/min (A) (by C-G formula based on SCr of 1.41 mg/dL (H)). Liver Function Tests: No results for input(s): AST, ALT, ALKPHOS, BILITOT, PROT, ALBUMIN in the last 168 hours. No results for input(s): LIPASE, AMYLASE in the last 168 hours. No results for input(s): AMMONIA in the last 168 hours. Coagulation Profile: No results for input(s): INR, PROTIME in the last 168 hours. Cardiac Enzymes: Recent Labs  Lab 11/07/18 2234 11/08/18 0421  TROPONINI 0.04* 0.04*   BNP (last 3 results) No results for input(s): PROBNP in the last 8760 hours. HbA1C: No results for input(s): HGBA1C in the last 72 hours. CBG: No results for input(s): GLUCAP in the last 168 hours. Lipid Profile: No results for input(s): CHOL, HDL, LDLCALC, TRIG, CHOLHDL, LDLDIRECT in the last 72 hours. Thyroid Function Tests: No results for input(s): TSH, T4TOTAL, FREET4, T3FREE, THYROIDAB in the last 72 hours. Anemia Panel: No results for input(s): VITAMINB12, FOLATE, FERRITIN, TIBC, IRON, RETICCTPCT in the last 72 hours. Sepsis Labs: No results for input(s): PROCALCITON, LATICACIDVEN in the last 168 hours.  No results found for this or any previous visit (from the past 240  hour(s)).   Radiology Studies: No results found.  Scheduled Meds: . carvedilol  6.25 mg Oral BID WC  . enoxaparin (LOVENOX) injection  40 mg Subcutaneous Q24H  . furosemide  40 mg Intravenous BID  . isosorbide-hydrALAZINE  1 tablet Oral TID  . [START ON 11/11/2018] losartan  50 mg Oral Daily   Continuous Infusions:   LOS: 2 days   Rickey Barbara, MD [facility] Pager On Amion  If 7PM-7AM, please contact night-coverage 11/10/2018, 1:48 PM

## 2018-11-10 NOTE — Progress Notes (Addendum)
Progress Note  Patient Name: Dale [name] Date of Encounter: 11/10/2018  Primary Cardiologist: Kristeen Miss, MD   Subjective   No complaints this morning. Feeling better. Less dyspnea. Denies CP. Occasional tachy palpitations but brief. Denies syncope and near syncope.   Inpatient Medications    Scheduled Meds: . carvedilol  6.25 mg Oral BID WC  . enoxaparin (LOVENOX) injection  40 mg Subcutaneous Q24H  . furosemide  40 mg Intravenous BID  . isosorbide-hydrALAZINE  1 tablet Oral TID  . losartan  25 mg Oral Daily   Continuous Infusions:  PRN Meds: acetaminophen, hydrALAZINE, ipratropium-albuterol, traMADol   Vital Signs    Vitals:   11/09/18 1946 11/09/18 2124 11/10/18 0552 11/10/18 0705  BP: (!) 127/92 (!) 136/93 (!) 143/96   Pulse: 88 89 89   Resp: 18 18 16    Temp: (!) 97.5 F (36.4 C) 97.7 F (36.5 C) 98.1 F (36.7 C)   TempSrc: Oral Oral Oral   SpO2: 94% 97% 95%   Weight:    104.6 kg  Height:        Intake/Output Summary (Last 24 hours) at 11/10/2018 0805 Last data filed at 11/10/2018 0000 Gross per 24 hour  Intake 480 ml  Output 2200 ml  Net -1720 ml   Filed Weights   11/08/18 0804 11/09/18 0627 11/10/18 0705  Weight: 103.1 kg 104.1 kg 104.6 kg    Telemetry    NSR in the 80s currenty, 7 beat run of NSVT  - Personally Reviewed  ECG    SR LVH  - Personally Reviewed  Physical Exam   GEN: No acute distress.   Neck: No JVD Cardiac: RRR, no murmurs, rubs, or gallops.  Respiratory: Clear to auscultation bilaterally. GI: Soft, nontender, non-distended  MS: No edema; No deformity. Neuro:  Nonfocal  Psych: Normal affect   Labs    Chemistry Recent Labs  Lab 11/08/18 0421 11/09/18 0457 11/10/18 0425  NA 138 138 141  K 3.8 3.7 3.7  CL 104 104 105  CO2 24 25 27   GLUCOSE 103* 101* 94  BUN 27* 30* 29*  CREATININE 1.41* 1.47* 1.41*  CALCIUM 8.7* 8.6* 8.9  GFRNONAA 56* 53* 56*  GFRAA >60 >60 >60  ANIONGAP 10 9 9      Hematology Recent  Labs  Lab 11/08/18 0421 11/09/18 0457 11/10/18 0425  WBC 5.5 5.5 5.4  RBC 4.34 3.98* 4.23  HGB 12.1* 11.4* 11.9*  HCT 37.4* 35.0* 36.3*  MCV 86.2 87.9 85.8  MCH 27.9 28.6 28.1  MCHC 32.4 32.6 32.8  RDW 14.4 14.5 14.6  PLT 293 288 298    Cardiac Enzymes Recent Labs  Lab 11/07/18 2234 11/08/18 0421  TROPONINI 0.04* 0.04*   No results for input(s): TROPIPOC in the last 168 hours.   BNP Recent Labs  Lab 11/07/18 2234  BNP 2,330.9*     DDimer No results for input(s): DDIMER in the last 168 hours.   Radiology    No results found.  Cardiac Studies   2D Echo 09/27/18 Study Conclusions  - Left ventricle: The cavity size was mildly dilated. Wall   thickness was increased in a pattern of mild LVH. Systolic   function was severely reduced. The estimated ejection fraction   was in the range of 20% to 25%. Diffuse hypokinesis. Features are   consistent with a pseudonormal left ventricular filling pattern,   with concomitant abnormal relaxation and increased filling   pressure (grade 2 diastolic dysfunction). Doppler parameters are  consistent with high ventricular filling pressure. - Aortic valve: There was trivial regurgitation. - Mitral valve: There was mild regurgitation. - Left atrium: The atrium was severely dilated. - Right ventricle: The cavity size was mildly dilated. Systolic   function was mildly reduced. - Right atrium: The atrium was moderately dilated. - Pericardium, extracardiac: A trivial pericardial effusion was   identified.  Impressions:  - Severe global reduction in LV systolic function; mild LVH and   LVE; moderate diastolic dysfunction; trace AI; mild MR; biatrial   enlargement; mild RVE with mild RV dysfunction.   Patient Profile   Dale [name] is a 58 y.o. male with a hx of substance abuse, chronic combined systolic and diastolic congestive heart failure, EF 25% by recent echocardiogram, uncontrolled hypertension, chronic stage II  kidney disease and h/o noncompliance, who is being seen today for the evaluation of acute on chronic systolic congestive heart failure at the request of Rickey Barbara MD.  Assessment & Plan    1. Acute on Chronic Combined Systolic and Diastolic CHF: good urinary response to IV Lasix. 2.2 L out yesterday. Doubt weights are reliable (recodered at 227lb>>229lb>>230 lb). Appears near euvolemic on exam. Renal function and electrolytes are stable. Pt reports poor UOP with home diuretic in the past (Lasix 40 mg PO). Consider changing home PO diuretic to Torsemide to see if better response. We discussed checking weight daily for volume status monitoring once he returns home.   2. Cardiomyopathy: Echo 25% by recent echo. Etiology unclear.  Previously felt secondary to cocaine abuse, alcohol use or hypertension. He is currently on Coreg, Losartan and Bidil. We do not think given his social history he will be able to afford or comply with Entresto.  If he can avoid cocaine and be compliant with medications then would need repeat echocardiogram 3 months after medications fully titrated.  If ejection fraction remains reduced then would need ischemia evaluation and could consider ICD at that point but presently not a candidate given recurrent cocaine use.  3. NSVT: 7 beat run noted on tele. In the setting of cardiomyopathy w/ EF < 35%. He notes occasional tachy palpitations but brief. Denies syncope and near syncope.  His  blocker, coreg was increased yesterday. K and Mg are both WNL today at 3.7 and 2.1 respectively. Continue to monitor electrolytes.   4. Stage III CKD: SCr stable at 1.41, c/w baseline.   5. HTN: mildly elevated at 143/96 this morning. Coreg was increased yesterday. Also on Bidil, Losartan and Lasix. Continue to monitor closely.   6. Cocaine Abuse: patient counseled on avoiding  7. Elevated Troponin: flat and low level, at 0.04>>0.04. Not c/w ACS. No plans for ischemic evaluation at this time.     For questions or updates, please contact [facility] Please consult [email or web address] for contact info under        Signed, Robbie Lis, PA-C  11/10/2018, 8:05 AM   As above, patient seen and examined.  His dyspnea is improving but not at baseline.  No chest pain.  I will continue with present dose of Lasix.  Continue BiDil, carvedilol and increase Cozaar to 50 mg daily.  Follow renal function.  As outlined previously I do not think he will be able to afford or comply with Entresto.  I have again stressed the importance of avoiding cocaine.  If he is compliant with medications and can avoid cocaine we will plan to repeat echocardiogram 3 months after medications fully titrated.  If ejection fraction remains reduced  would need ischemia evaluation.  Olga Millers, MD

## 2018-11-11 DIAGNOSIS — N179 Acute kidney failure, unspecified: Secondary | ICD-10-CM

## 2018-11-11 LAB — BASIC METABOLIC PANEL
Anion gap: 10 (ref 5–15)
BUN: 39 mg/dL — ABNORMAL HIGH (ref 6–20)
CO2: 26 mmol/L (ref 22–32)
Calcium: 8.8 mg/dL — ABNORMAL LOW (ref 8.9–10.3)
Chloride: 104 mmol/L (ref 98–111)
Creatinine, Ser: 1.5 mg/dL — ABNORMAL HIGH (ref 0.61–1.24)
GFR calc Af Amer: 60 mL/min — ABNORMAL LOW (ref >60)
GFR calc non Af Amer: 52 mL/min — ABNORMAL LOW (ref >60)
Glucose, Bld: 97 mg/dL (ref 70–99)
Potassium: 3.6 mmol/L (ref 3.5–5.1)
Sodium: 140 mmol/L (ref 135–145)

## 2018-11-11 LAB — CBC
HCT: 37.1 % — ABNORMAL LOW (ref 39.0–52.0)
Hemoglobin: 11.9 g/dL — ABNORMAL LOW (ref 13.0–17.0)
MCH: 27.4 pg (ref 26.0–34.0)
MCHC: 32.1 g/dL (ref 30.0–36.0)
MCV: 85.3 fL (ref 80.0–100.0)
Platelets: 311 10*3/uL (ref 150–400)
RBC: 4.35 MIL/uL (ref 4.22–5.81)
RDW: 14.6 % (ref 11.5–15.5)
WBC: 6 10*3/uL (ref 4.0–10.5)
nRBC: 0 % (ref 0.0–0.2)

## 2018-11-11 LAB — MAGNESIUM: Magnesium: 2.4 mg/dL (ref 1.7–2.4)

## 2018-11-11 NOTE — Care Management Note (Signed)
Case Management Note  Patient Details  Name: Dale [name] MRN: [id number] Date of Birth: 11-20-1960  Subjective/Objective:                    Action/Plan:Pt use [facility] for PCP and medications.    Expected Discharge Date:                  Expected Discharge Plan:  Home/Self Care  In-House Referral:  Clinical Social Work  Discharge planning Services  CM Consult  Post Acute Care Choice:    Choice offered to:     DME Arranged:    DME Agency:     HH Arranged:    HH Agency:     Status of Service:  Completed, signed off  If discussed at [employer], dates discussed:    Additional CommentsGeni Bers, RN 11/11/2018, 2:42 PM

## 2018-11-11 NOTE — Progress Notes (Signed)
PROGRESS NOTE    Dale [name]  PPI:[id number] DOB: 30-Aug-1961 DOA: 11/07/2018 PCP: [name], FNP    Brief Narrative:  58 y.o. male with hx of polysubstance abuse (alcohol and cocaine), systolic and diastolic CHF and HFrEF (EF 20-25%) suspected due to HTN (no prior ischemic workup), CKD stage 2 who presented with acute dyspnea x 1 day. Patient states that he noted acute episodes of dyspnea while resting in bed the night prior to admission, making him toss and turn. He then noted progressive shortness of breath while ambulating -- acutely dyspneic with walking across the room. Reports dietary non compliance including eating pizza earlier today. Has been having productive cough with clear-whitish sputum over the past few days and subjective lower extremity edema. Denies chest pressure or pain. Patient reports compliance with lasix 40mg  and losartan 50mg  daily, although he states that he has not noticed significant urine output with the lasix. Of note, patient was also started on coreg and amlodipine in November 2019 when he was hospitalized for ADHF and diagnosed with systolic CHF and HFrEF for the first time; however, patient left AMA without filling his complete medication regimen. His UDS was positive on 09/27/18 for cocaine.    Assessment & Plan:   Acute CHF with dyspnea and main presenting symptom.  - EF 20-25% in Nov 2019.  -No prior ischemic workup. 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  - dry weight likely around 105 kg - continue bidil TID on 11/08/17 - continued on coreg 6.25mg , cozaar, imdur  - strict ins/outs; daily weights - fluid and dietary sodium restriction - Losartan resumed per Cardiology Cardiology is following.  Patient was initially started on IV diuretics.  Due to rising creatinine this has been held today.  Patient symptoms have improved.  Plan is to recheck renal function tomorrow and consider torsemide.  AKI on CKD stage 2  Acute renal failure most likely due to cardiorenal.   Rise in creatinine today most likely due to diuresis.  Intravenous Lasix placed on hold. -baseline Cr ~ 1.2  Hx of polysubstance abuse -cocaine pos -cessation done at bedside. Pt seems very interested in stopping his drug abuse and seems motivated to "step on the brakes," in reference to ending his self-destructive behaviors  Essential hypertension  Continue antihypertensives.  Pressure is reasonably well controlled.  Nonsustained VT  Cardiology following.  On beta-blocker.   DVT prophylaxis: Lovenox subQ Code Status: Full Family Communication: Pt in room, family not at bedside Disposition Plan: Discharge when cleared by cardiology.  Consultants:   Cardiology  Procedures:   ECHO Study Conclusions  - Left ventricle: The cavity size was mildly dilated. Wall   thickness was increased in a pattern of mild LVH. Systolic   function was severely reduced. The estimated ejection fraction   was in the range of 20% to 25%. Diffuse hypokinesis. Features are   consistent with a pseudonormal left ventricular filling pattern,   with concomitant abnormal relaxation and increased filling   pressure (grade 2 diastolic dysfunction). Doppler parameters are   consistent with high ventricular filling pressure. - Aortic valve: There was trivial regurgitation. - Mitral valve: There was mild regurgitation. - Left atrium: The atrium was severely dilated. - Right ventricle: The cavity size was mildly dilated. Systolic   function was mildly reduced. - Right atrium: The atrium was moderately dilated. - Pericardium, extracardiac: A trivial pericardial effusion was   identified.  Impressions:  - Severe global reduction in LV systolic function; mild LVH and   LVE;  moderate diastolic dysfunction; trace AI; mild MR; biatrial   enlargement; mild RVE with mild RV dysfunction.   Antimicrobials: Anti-infectives (From admission, onward)   None      Subjective: Patient states that he is feeling  better.  Shortness of breath is improved.  Objective: Vitals:   11/11/18 0628 11/11/18 0802 11/11/18 1026 11/11/18 1340  BP:   110/75 125/87  Pulse:  84 83 83  Resp:   18 18  Temp:    97.7 F (36.5 C)  TempSrc:    Oral  SpO2:   95% 95%  Weight: 101.5 kg     Height:        Intake/Output Summary (Last 24 hours) at 11/11/2018 1345 Last data filed at 11/11/2018 1043 Gross per 24 hour  Intake 940 ml  Output 2850 ml  Net -1910 ml   Filed Weights   11/09/18 0627 11/10/18 0705 11/11/18 0628  Weight: 104.1 kg 104.6 kg 101.5 kg    Examination: General appearance: Awake alert.  In no distress Resp: Clear to auscultation bilaterally.  Normal effort Cardio: S1-S2 is normal regular.  No S3-S4.  No rubs murmurs or bruit GI: Abdomen is soft.  Nontender nondistended.  Bowel sounds are present normal.  No masses organomegaly Extremities: No edema.  Full range of motion of lower extremities. Neurologic: Alert and oriented x3.  No focal neurological deficits.    Data Reviewed: I have personally reviewed following labs and imaging studies  CBC: Recent Labs  Lab 11/07/18 2234 11/08/18 0421 11/09/18 0457 11/10/18 0425 11/11/18 0349  WBC 5.7 5.5 5.5 5.4 6.0  HGB 12.2* 12.1* 11.4* 11.9* 11.9*  HCT 37.8* 37.4* 35.0* 36.3* 37.1*  MCV 86.5 86.2 87.9 85.8 85.3  PLT 337 293 288 298 311   Basic Metabolic Panel: Recent Labs  Lab 11/07/18 2234 11/08/18 0421 11/09/18 0457 11/10/18 0425 11/11/18 0349  NA 137 138 138 141 140  K 3.9 3.8 3.7 3.7 3.6  CL 104 104 104 105 104  CO2 23 24 25 27 26   GLUCOSE 108* 103* 101* 94 97  BUN 29* 27* 30* 29* 39*  CREATININE 1.51* 1.41* 1.47* 1.41* 1.50*  CALCIUM 9.1 8.7* 8.6* 8.9 8.8*  MG  --   --  2.1 2.1 2.4   GFR: Estimated Creatinine Clearance: 69.7 mL/min (A) (by C-G formula based on SCr of 1.5 mg/dL (H)).  Cardiac Enzymes: Recent Labs  Lab 11/07/18 2234 11/08/18 0421  TROPONINI 0.04* 0.04*     Radiology Studies: No results  found.  Scheduled Meds: . carvedilol  6.25 mg Oral BID WC  . enoxaparin (LOVENOX) injection  40 mg Subcutaneous Q24H  . isosorbide-hydrALAZINE  1 tablet Oral TID  . losartan  50 mg Oral Daily   Continuous Infusions:   LOS: 3 days   Osvaldo Shipper, MD [facility] Pager On Amion  If 7PM-7AM, please contact night-coverage 11/11/2018, 1:45 PM

## 2018-11-11 NOTE — Progress Notes (Addendum)
Progress Note  Patient Name: Dale [name] Date of Encounter: 11/11/2018  Primary Cardiologist: Kristeen Miss, MD   Subjective   Feeling better today. He feels that he is "80% better". Still with some mild orthopnea but denies exertional dyspnea.   Inpatient Medications    Scheduled Meds: . carvedilol  6.25 mg Oral BID WC  . enoxaparin (LOVENOX) injection  40 mg Subcutaneous Q24H  . furosemide  40 mg Intravenous BID  . isosorbide-hydrALAZINE  1 tablet Oral TID  . losartan  50 mg Oral Daily   Continuous Infusions:  PRN Meds: acetaminophen, hydrALAZINE, ipratropium-albuterol, traMADol   Vital Signs    Vitals:   11/10/18 2010 11/11/18 0445 11/11/18 0628 11/11/18 0802  BP: (!) 140/99 (!) 148/100    Pulse: 87 91  84  Resp: 18 18    Temp: 97.8 F (36.6 C) 97.9 F (36.6 C)    TempSrc: Oral Oral    SpO2: 97% 97%    Weight:   101.5 kg   Height:        Intake/Output Summary (Last 24 hours) at 11/11/2018 0905 Last data filed at 11/11/2018 0506 Gross per 24 hour  Intake 940 ml  Output 2950 ml  Net -2010 ml   Filed Weights   11/09/18 0627 11/10/18 0705 11/11/18 0628  Weight: 104.1 kg 104.6 kg 101.5 kg    Telemetry    NSR, isolated PVCs, on couplet no recurrence of NSVT- Personally Reviewed  ECG    Not performed today. - Personally Reviewed  Physical Exam   GEN: No acute distress.   Neck: No JVD Cardiac: RRR, no murmurs, rubs, or gallops.  Respiratory: Clear to auscultation bilaterally. GI: Soft, nontender, non-distended  MS: No edema; No deformity. Neuro:  Nonfocal  Psych: Normal affect   Labs    Chemistry Recent Labs  Lab 11/09/18 0457 11/10/18 0425 11/11/18 0349  NA 138 141 140  K 3.7 3.7 3.6  CL 104 105 104  CO2 25 27 26   GLUCOSE 101* 94 97  BUN 30* 29* 39*  CREATININE 1.47* 1.41* 1.50*  CALCIUM 8.6* 8.9 8.8*  GFRNONAA 53* 56* 52*  GFRAA >60 >60 60*  ANIONGAP 9 9 10      Hematology Recent Labs  Lab 11/09/18 0457 11/10/18 0425  11/11/18 0349  WBC 5.5 5.4 6.0  RBC 3.98* 4.23 4.35  HGB 11.4* 11.9* 11.9*  HCT 35.0* 36.3* 37.1*  MCV 87.9 85.8 85.3  MCH 28.6 28.1 27.4  MCHC 32.6 32.8 32.1  RDW 14.5 14.6 14.6  PLT 288 298 311    Cardiac Enzymes Recent Labs  Lab 11/07/18 2234 11/08/18 0421  TROPONINI 0.04* 0.04*   No results for input(s): TROPIPOC in the last 168 hours.   BNP Recent Labs  Lab 11/07/18 2234  BNP 2,330.9*     DDimer No results for input(s): DDIMER in the last 168 hours.   Radiology    No results found.  Cardiac Studies   2D Echo 09/27/18 Study Conclusions  - Left ventricle: The cavity size was mildly dilated. Wall thickness was increased in a pattern of mild LVH. Systolic function was severely reduced. The estimated ejection fraction was in the range of 20% to 25%. Diffuse hypokinesis. Features are consistent with a pseudonormal left ventricular filling pattern, with concomitant abnormal relaxation and increased filling pressure (grade 2 diastolic dysfunction). Doppler parameters are consistent with high ventricular filling pressure. - Aortic valve: There was trivial regurgitation. - Mitral valve: There was mild regurgitation. - Left atrium:  The atrium was severely dilated. - Right ventricle: The cavity size was mildly dilated. Systolic function was mildly reduced. - Right atrium: The atrium was moderately dilated. - Pericardium, extracardiac: A trivial pericardial effusion was identified.  Impressions:  - Severe global reduction in LV systolic function; mild LVH and LVE; moderate diastolic dysfunction; trace AI; mild MR; biatrial enlargement; mild RVE with mild RV dysfunction.  Patient Profile     Dale [name] a 58 y.o.malewith a hx of substance abuse, chronic combined systolic and diastolic congestive heart failure, EF 25% by recent echocardiogram, uncontrolled hypertension, chronic stage II kidney disease and h/o noncompliance,who is  being seen today for the evaluation ofacute on chronic systolic congestive heart failureat the request of Rickey Barbara MD.   Assessment & Plan    1. Acute on Chronic Combined Systolic and Diastolic CHF: good urinary response to IV Lasix. 2.95 L out yesterday. Appears euvolemic on exam. Scr bumped from 1.41>>1.50 overnight. He has already received his AM dose of IV Lasix. Recommend stopping IV Lasix today and transition to PO diuretics in the AM. Continue to monitor renal function closely. Also of note, Pt reports poor UOP with home diuretic in the past (was on Lasix 40 mg PO). Consider changing home PO diuretic to Torsemide to see if better response. We discussed checking weight daily for volume status monitoring once he returns home.   2. Cardiomyopathy: Echo 25% by recent echo. Etiology unclear. Previously felt secondary to cocaine abuse, alcohol use or hypertension. He is currently on Coreg, Losartan and Bidil. We do not think given his social history he will be able to afford or comply with Entresto. If he can avoid cocaine and be compliant with medications then would need repeat echocardiogram 3 months after medications fully titrated. If ejection fraction remains reduced then would need ischemia evaluation and could consider ICD at that point but presently not a candidate given recurrent cocaine use.  3. NSVT: in the setting of severely reduced LVEF, < 35%. His coreg dose has been increased. No recurrence detected on tele review this morning. Only isolated PVCs and 1 couplet but no NSVT. K and Mg are both WNL today at 3.8 and 2.4 respectively. Continue to monitor electrolytes.   4. Stage III CKD: Slight bump in SCr overnight from 1.41>>1.50. Will hold PM dose of IV Lasix this evening. Likely transition to PO diuretics tomorrow. F/u BMP in the AM.   5. HTN: Diastolic BPs have been elevated in the low 100s. HR stable. May consider increasing Coreg dose further. Also on Bidil, Losartan and  Lasix. Continue to monitor closely.   6. Cocaine Abuse: patient counseled on avoiding  7. Elevated Troponin: flat and low level, at 0.04>>0.04. Not c/w ACS. No plans for ischemic evaluation at this time.      For questions or updates, please contact [facility] Please consult [email or web address] for contact info under        Signed, Robbie Lis, PA-C  11/11/2018, 9:05 AM   As above, patient seen and examined.  He denies dyspnea or chest pain.  Slight worsening in renal function.  We will hold further IV diuresis.  Recheck renal function tomorrow morning.  Will likely discharge tomorrow morning on oral Demadex.  Continue remaining cardiac medications. Olga Millers, MD

## 2018-11-12 ENCOUNTER — Other Ambulatory Visit: Payer: Self-pay | Admitting: Cardiology

## 2018-11-12 DIAGNOSIS — Z5181 Encounter for therapeutic drug level monitoring: Secondary | ICD-10-CM

## 2018-11-12 DIAGNOSIS — N289 Disorder of kidney and ureter, unspecified: Secondary | ICD-10-CM

## 2018-11-12 LAB — BASIC METABOLIC PANEL
Anion gap: 8 (ref 5–15)
BUN: 39 mg/dL — ABNORMAL HIGH (ref 6–20)
CO2: 26 mmol/L (ref 22–32)
Calcium: 8.7 mg/dL — ABNORMAL LOW (ref 8.9–10.3)
Chloride: 106 mmol/L (ref 98–111)
Creatinine, Ser: 1.39 mg/dL — ABNORMAL HIGH (ref 0.61–1.24)
GFR calc Af Amer: >60 mL/min (ref >60)
GFR calc non Af Amer: 57 mL/min — ABNORMAL LOW (ref >60)
Glucose, Bld: 103 mg/dL — ABNORMAL HIGH (ref 70–99)
Potassium: 3.8 mmol/L (ref 3.5–5.1)
Sodium: 140 mmol/L (ref 135–145)

## 2018-11-12 LAB — CBC
HCT: 36.6 % — ABNORMAL LOW (ref 39.0–52.0)
Hemoglobin: 11.8 g/dL — ABNORMAL LOW (ref 13.0–17.0)
MCH: 28.4 pg (ref 26.0–34.0)
MCHC: 32.2 g/dL (ref 30.0–36.0)
MCV: 88 fL (ref 80.0–100.0)
Platelets: 292 10*3/uL (ref 150–400)
RBC: 4.16 MIL/uL — ABNORMAL LOW (ref 4.22–5.81)
RDW: 14.7 % (ref 11.5–15.5)
WBC: 6.4 10*3/uL (ref 4.0–10.5)
nRBC: 0 % (ref 0.0–0.2)

## 2018-11-12 LAB — MAGNESIUM: Magnesium: 2.3 mg/dL (ref 1.7–2.4)

## 2018-11-12 MED ORDER — TORSEMIDE 20 MG PO TABS: 20.0000 mg | ORAL_TABLET | Freq: Every day | ORAL | 1 refills | Status: DC

## 2018-11-12 MED ORDER — CARVEDILOL 6.25 MG PO TABS: 6.2500 mg | ORAL_TABLET | Freq: Two times a day (BID) | ORAL | 1 refills | Status: DC

## 2018-11-12 MED ORDER — ISOSORB DINITRATE-HYDRALAZINE 20-37.5 MG PO TABS: 1.0000 | ORAL_TABLET | Freq: Three times a day (TID) | ORAL | 1 refills | Status: DC

## 2018-11-12 MED FILL — TORSEMIDE 20 MG TABLET: 20 | 30 days supply | Qty: 30 | Fill #0

## 2018-11-12 MED FILL — !BIDIL TABLET: 20-37.5MG | 30 days supply | Qty: 90 | Fill #0

## 2018-11-12 MED FILL — CARVEDILOL 6.25 MG TABLET: 6.25 | 30 days supply | Qty: 60 | Fill #0

## 2018-11-12 NOTE — Progress Notes (Signed)
Unable to assist pt with co pays. Pt will need to go to [facility] for his medications.

## 2018-11-12 NOTE — Progress Notes (Signed)
Progress Note  Patient Name: Dale [name] Date of Encounter: 11/12/2018  Primary Cardiologist: Kristeen Miss, MD   Subjective   Back to baseline. No dyspnea. Ready to go home.   Inpatient Medications    Scheduled Meds: . carvedilol  6.25 mg Oral BID WC  . enoxaparin (LOVENOX) injection  40 mg Subcutaneous Q24H  . isosorbide-hydrALAZINE  1 tablet Oral TID  . losartan  50 mg Oral Daily   Continuous Infusions:  PRN Meds: acetaminophen, hydrALAZINE, ipratropium-albuterol, traMADol   Vital Signs    Vitals:   11/11/18 1340 11/11/18 2137 11/12/18 0608 11/12/18 0639  BP: 125/87 133/89 (!) 146/101 (!) 129/91  Pulse: 83 85 89   Resp: 18 20 20    Temp: 97.7 F (36.5 C) 97.7 F (36.5 C) 98.3 F (36.8 C)   TempSrc: Oral Oral Oral   SpO2: 95% 97% 95%   Weight:      Height:        Intake/Output Summary (Last 24 hours) at 11/12/2018 1322 Last data filed at 11/12/2018 0743 Gross per 24 hour  Intake 2022 ml  Output 1025 ml  Net 997 ml   Filed Weights   11/09/18 0627 11/10/18 0705 11/11/18 0628  Weight: 104.1 kg 104.6 kg 101.5 kg    Telemetry    NSR 1 4 beat run of NSVT   ECG    Not performed today. - Personally Reviewed  Physical Exam   GEN: middle aged AAM, well developed in No acute distress.   Neck: No JVD Cardiac: RRR, no murmurs, rubs, or gallops.  Respiratory: Clear to auscultation bilaterally. GI: Soft, nontender, non-distended  MS: No edema; No deformity. Neuro:  Nonfocal  Psych: Normal affect   Labs    Chemistry Recent Labs  Lab 11/10/18 0425 11/11/18 0349 11/12/18 0404  NA 141 140 140  K 3.7 3.6 3.8  CL 105 104 106  CO2 27 26 26   GLUCOSE 94 97 103*  BUN 29* 39* 39*  CREATININE 1.41* 1.50* 1.39*  CALCIUM 8.9 8.8* 8.7*  GFRNONAA 56* 52* 57*  GFRAA >60 60* >60  ANIONGAP 9 10 8      Hematology Recent Labs  Lab 11/10/18 0425 11/11/18 0349 11/12/18 0404  WBC 5.4 6.0 6.4  RBC 4.23 4.35 4.16*  HGB 11.9* 11.9* 11.8*  HCT 36.3* 37.1*  36.6*  MCV 85.8 85.3 88.0  MCH 28.1 27.4 28.4  MCHC 32.8 32.1 32.2  RDW 14.6 14.6 14.7  PLT 298 311 292    Cardiac Enzymes Recent Labs  Lab 11/07/18 2234 11/08/18 0421  TROPONINI 0.04* 0.04*   No results for input(s): TROPIPOC in the last 168 hours.   BNP Recent Labs  Lab 11/07/18 2234  BNP 2,330.9*     DDimer No results for input(s): DDIMER in the last 168 hours.   Radiology    No results found.  Cardiac Studies   2D Echo 09/27/18 Study Conclusions  - Left ventricle: The cavity size was mildly dilated. Wall thickness was increased in a pattern of mild LVH. Systolic function was severely reduced. The estimated ejection fraction was in the range of 20% to 25%. Diffuse hypokinesis. Features are consistent with a pseudonormal left ventricular filling pattern, with concomitant abnormal relaxation and increased filling pressure (grade 2 diastolic dysfunction). Doppler parameters are consistent with high ventricular filling pressure. - Aortic valve: There was trivial regurgitation. - Mitral valve: There was mild regurgitation. - Left atrium: The atrium was severely dilated. - Right ventricle: The cavity size was mildly  dilated. Systolic function was mildly reduced. - Right atrium: The atrium was moderately dilated. - Pericardium, extracardiac: A trivial pericardial effusion was identified.  Impressions:  - Severe global reduction in LV systolic function; mild LVH and LVE; moderate diastolic dysfunction; trace AI; mild MR; biatrial enlargement; mild RVE with mild RV dysfunction.  Patient Profile     Dale [name] a 58 y.o.malewith a hx of substance abuse, chronic combined systolic and diastolic congestive heart failure, EF 25% by recent echocardiogram, uncontrolled hypertension, chronic stage II kidney disease and h/o noncompliance,who is being seen today for the evaluation ofacute on chronic systolic congestive heart failureat the  request of Rickey Barbara MD.   Assessment & Plan    1. Acute on Chronic Combined Systolic and Diastolic CHF: Good response to IV diuretics. Euvolemic. Dyspnea resolved. Renal function improved. Transition back to PO diuretics at discharge. Of note, Pt reports poor UOP with home diuretic in the past (was on Lasix 40 mg PO). Will change to Torsemide, 20 mg once daily. We will arrange post hospital f/u in 1-2 weeks and will get repeat BMP to reassess renal function and electrolytes on new diuretic. We discussed checking weight daily for volume status monitoring once he returns home as well as low sodium diet.   2. Cardiomyopathy: Echo 25% by recent echo. Etiology unclear. Previously felt secondary to cocaine abuse, alcohol use or hypertension. He is currently on Coreg, Losartan and Bidil. We do not think given his social history he will be able to afford or comply with Entresto. If he can avoid cocaine and be compliant with medications then would need repeat echocardiogram 3 months after medications fully titrated. If ejection fraction remains reduced then would need ischemia evaluation and could consider ICD at that point but presently not a candidate given recurrent cocaine use. We will continue titration of HF meds, as BP allows, in clinic.   3. NSVT: in the setting of severely reduced LVEF, < 35%. His coreg dose has been increased. 1 4 beat run noted on tele in last 24 hrs. K and Mg are both WNL today at 3.8 and 2.3 respectively. Will obtain f/u BMP in clinic to assess electrolytes on Torsemide.    4. Stage III CKD: SCr improved since yesterday, down from 1.50>>1.39.  Transition back to PO diuretics today. F/u BMP in our office in 1 week.   5. HTN: Continue current regimen on discharge. Coreg, Bidil, Losartan and add Torsemide. Low sodium diet advised.   6. Cocaine Abuse: patient counseled on avoiding  7. Elevated Troponin: flat and low level, at 0.04>>0.04. Not c/w ACS. No plans for  ischemic evaluation at this time.   We will arrange hospital f/u and appt for labs in 1 week. Will place in AVS.    For questions or updates, please contact [facility] Please consult [email or web address] for contact info under        Signed, Olga Millers, MD  11/12/2018, 1:22 PM   As above, patient seen and examined.  He denies dyspnea or chest pain.  We will change diuretics to Demadex 20 mg daily.  Continue present dose of carvedilol, losartan and BiDil.  As outlined previously I do not think he will be able to afford or be compliant with Entresto.  Patient can be discharged from a cardiology standpoint.  Follow-up transition of care appointment APP 1 week.  Check potassium and renal function at that time.  If patient is compliant with medications and avoids cocaine then we will plan  to repeat echocardiogram 3 months after medications fully titrated.  If ejection fraction remains reduced could consider ischemia evaluation followed by ICD at that time.  Olga Millers, MD

## 2018-11-12 NOTE — Progress Notes (Addendum)
Pt Discharged, states he is homeless and go to a shelter, instructions reviewed with patients along with follow up appointments. Acknowledged understanding. Request bus pass, bus pass given , states "what about my meds", "I do not have the money to get my meds", Care management consulted and updated patient with their response. limited resources available and pt has used the [facility] program in the past according to Care Management.Pt walked out alone per patient request. SRP,RN

## 2018-11-12 NOTE — Clinical Social Work Note (Signed)
Clinical Social Work Assessment  Patient Details  Name: Dale [name] MRN: [id number] Date of Birth: Dec 01, 1960  Date of referral:  11/12/18               Reason for consult:  Substance Use/ETOH Abuse                Permission sought to share information with:    Permission granted to share information::     Name::        Agency::  [facility]   Relationship::     Contact Information:     Housing/Transportation Living arrangements for the past 2 months:  Civil engineer, contracting of Information:  Patient Patient Interpreter Needed:  None Criminal Activity/Legal Involvement Pertinent to Current Situation/Hospitalization:  No - Comment as needed Significant Relationships:  Adult Children, Merchandiser, retail Lives with:  Self Do you feel safe going back to the place where you live?  Yes Need for family participation in patient care:  No (Coment)  Care giving concerns:   Patient reports he is homeless and has been unemployed. Per patient, he is ready to stop using "cocaine." Patient reports he has been using since since the nineties. 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 Patient reports he often uses when he "starts to think about all the wrong things in my life." Patient reports it has been difficult to stop using because he always goes back to the environment where he uses. Patient report he plans to stay at shelter at discharge.  Patient reports he has been working with a veteran support personal to try and get VA benefits and disability.    Social Worker assessment / plan:  CSW discussed patient substance use and made referral to [facility] with patient permission. Patient reports he missed his appointment that he had at [facility] about three weeks ago. CSW called and confirm the patient appointment for January 21. Patient reports understanding the process.   Plan: Follow up at [facility] for Substance use treatment.   Employment status:   Unemployed Health and safety inspector:  Self Pay (Medicaid Pending) PT Recommendations:  Not assessed at this time Information / Referral to community resources:  Outpatient Substance Abuse Treatment Options, Residential Substance Abuse Treatment Options  Patient/Family's Response to care:  Agreeable and Responding well to care.   Patient/Family's Understanding of and Emotional Response to Diagnosis, Current Treatment, and Prognosis:  Patient showed readiness to change and was receptive to SA resources.   Emotional Assessment Appearance:  Appears stated age Attitude/Demeanor/Rapport:    Affect (typically observed):  Accepting Orientation:  Oriented to Self, Oriented to Place, Oriented to  Time, Oriented to Situation Alcohol / Substance use:  Illicit Drugs Psych involvement (Current and /or in the community):  No (Comment)  Discharge Needs  Concerns to be addressed:  Substance Abuse Concerns Readmission within the last 30 days:  No Current discharge risk:  Substance Abuse Barriers to Discharge:  No Barriers Identified   Clearance Coots, LCSW 11/12/2018, 2:49 PM

## 2018-11-12 NOTE — Discharge Instructions (Signed)
Heart Failure °Heart failure is a condition in which the heart has trouble pumping blood because it has become weak or stiff. This means that the heart does not pump blood efficiently for the body to work well. For some people with heart failure, fluid may back up into the lungs and there may be swelling (edema) in the lower legs. Heart failure is usually a long-term (chronic) condition. It is important for you to take good care of yourself and follow the treatment plan from your health care provider. °What are the causes? °This condition is caused by some health problems, including: °· High blood pressure (hypertension). Hypertension causes the heart muscle to work harder than normal. High blood pressure eventually causes the heart to become stiff and weak. °· Coronary artery disease (CAD). CAD is the buildup of cholesterol and fat (plaques) in the arteries of the heart. °· Heart attack (myocardial infarction). Injured tissue, which is caused by the heart attack, does not contract as well and the heart's ability to pump blood is weakened. °· Abnormal heart valves. When the heart valves do not open and close properly, the heart muscle must pump harder to keep the blood flowing. °· Heart muscle disease (cardiomyopathy or myocarditis). Heart muscle disease is damage to the heart muscle from a variety of causes, such as drug or alcohol abuse, infections, or unknown causes. These can increase the risk of heart failure. °· Lung disease. When the lungs do not work properly, the heart must work harder. °What increases the risk? °Risk of heart failure increases as a person ages. This condition is also more likely to develop in people who: °· Are overweight. °· Are male. °· Smoke or chew tobacco. °· Abuse alcohol or illegal drugs. °· Have taken medicines that can damage the heart, such as chemotherapy drugs. °· Have diabetes. °? High blood sugar (glucose) is associated with high fat (lipid) levels in the blood. °? Diabetes  can also damage tiny blood vessels that carry nutrients to the heart muscle. °· Have abnormal heart rhythms. °· Have thyroid problems. °· Have low blood counts (anemia). °What are the signs or symptoms? °Symptoms of this condition include: °· Shortness of breath with activity, such as when climbing stairs. °· Persistent cough. °· Swelling of the feet, ankles, legs, or abdomen. °· Unexplained weight gain. °· Difficulty breathing when lying flat (orthopnea). °· Waking from sleep because of the need to sit up and get more air. °· Rapid heartbeat. °· Fatigue and loss of energy. °· Feeling light-headed, dizzy, or close to fainting. °· Loss of appetite. °· Nausea. °· Increased urination during the night (nocturia). °· Confusion. °How is this diagnosed? °This condition is diagnosed based on: °· Medical history, symptoms, and a physical exam. °· Diagnostic tests, which may include: °? Echocardiogram. °? Electrocardiogram (ECG). °? Chest X-ray. °? Blood tests. °? Exercise stress test. °? Radionuclide scans. °? Cardiac catheterization and angiogram. °How is this treated? °Treatment for this condition is aimed at managing the symptoms of heart failure. Medicines, behavioral changes, or other treatments may be necessary to treat heart failure. °Medicines °These may include: °· Angiotensin-converting enzyme (ACE) inhibitors. This type of medicine blocks the effects of a blood protein called angiotensin-converting enzyme. ACE inhibitors relax (dilate) the blood vessels and help to lower blood pressure. °· Angiotensin receptor blockers (ARBs). This type of medicine blocks the actions of a blood protein called angiotensin. ARBs dilate the blood vessels and help to lower blood pressure. °· Water pills (diuretics). Diuretics cause   the kidneys to remove salt and water from the blood. The extra fluid is removed through urination, leaving a lower volume of blood that the heart has to pump. °· Beta blockers. These improve heart muscle  strength and they prevent the heart from beating too quickly. °· Digoxin. This increases the force of the heartbeat. °Healthy behavior changes °These may include: °· Reaching and maintaining a healthy weight. °· Stopping smoking or chewing tobacco. °· Eating heart-healthy foods. °· Limiting or avoiding alcohol. °· Stopping use of street drugs (illegal drugs). °· Physical activity. °Other treatments °These may include: °· Surgery to open blocked coronary arteries or repair damaged heart valves. °· Placement of a biventricular pacemaker to improve heart muscle function (cardiac resynchronization therapy). This device paces both the right ventricle and left ventricle. °· Placement of a device to treat serious abnormal heart rhythms (implantable cardioverter defibrillator, or ICD). °· Placement of a device to improve the pumping ability of the heart (left ventricular assist device, or LVAD). °· Heart transplant. This can cure heart failure, and it is considered for certain patients who do not improve with other therapies. °Follow these instructions at home: °Medicines °· Take over-the-counter and prescription medicines only as told by your health care provider. Medicines are important in reducing the workload of your heart, slowing the progression of heart failure, and improving your symptoms. °? Do not stop taking your medicine unless your health care provider told you to do that. °? Do not skip any dose of medicine. °? Refill your prescriptions before you run out of medicine. You need your medicines every day. °Eating and drinking ° °· Eat heart-healthy foods. Talk with a dietitian to make an eating plan that is right for you. °? Choose foods that contain no trans fat and are low in saturated fat and cholesterol. Healthy choices include fresh or frozen fruits and vegetables, fish, lean meats, legumes, fat-free or low-fat dairy products, and whole-grain or high-fiber foods. °? Limit salt (sodium) if directed by your  health care provider. Sodium restriction may reduce symptoms of heart failure. Ask a dietitian to recommend heart-healthy seasonings. °? Use healthy cooking methods instead of frying. Healthy methods include roasting, grilling, broiling, baking, poaching, steaming, and stir-frying. °· Limit your fluid intake if directed by your health care provider. Fluid restriction may reduce symptoms of heart failure. °Lifestyle ° °· Stop smoking or using chewing tobacco. Nicotine and tobacco can damage your heart and your blood vessels. Do not use nicotine gum or patches before talking to your health care provider. °· Limit alcohol intake to no more than 1 drink per day for non-pregnant women and 2 drinks per day for men. One drink equals 12 oz of beer, 5 oz of wine, or 1½ oz of hard liquor. °? Drinking more than that is harmful to your heart. Tell your health care provider if you drink alcohol several times a week. °? Talk with your health care provider about whether any level of alcohol use is safe for you. °? If your heart has already been damaged by alcohol or you have severe heart failure, drinking alcohol should be stopped completely. °· Stop use of illegal drugs. °· Lose weight if directed by your health care provider. Weight loss may reduce symptoms of heart failure. °· Do moderate physical activity if directed by your health care provider. People who are elderly and people with severe heart failure should consult with a health care provider for physical activity recommendations. °Monitor important information ° °· Weigh   yourself every day. Keeping track of your weight daily helps you to notice excess fluid sooner. °? Weigh yourself every morning after you urinate and before you eat breakfast. °? Wear the same amount of clothing each time you weigh yourself. °? Record your daily weight. Provide your health care provider with your weight record. °· Monitor and record your blood pressure as told by your health care  provider. °· Check your pulse as told by your health care provider. °Dealing with extreme temperatures °· If the weather is extremely hot: °? Avoid vigorous physical activity. °? Use air conditioning or fans or seek a cooler location. °? Avoid caffeine and alcohol. °? Wear loose-fitting, lightweight, and light-colored clothing. °· If the weather is extremely cold: °? Avoid vigorous physical activity. °? Layer your clothes. °? Wear mittens or gloves, a hat, and a scarf when you go outside. °? Avoid alcohol. °General instructions °· Manage other health conditions such as hypertension, diabetes, thyroid disease, or abnormal heart rhythms as told by your health care provider. °· Learn to manage stress. If you need help to do this, ask your health care provider. °· Plan rest periods when fatigued. °· Get ongoing education and support as needed. °· Participate in or seek rehabilitation as needed to maintain or improve independence and quality of life. °· Stay up to date with immunizations. Keeping current on pneumococcal and influenza immunizations is especially important to prevent respiratory infections. °· Keep all follow-up visits as told by your health care provider. This is important. °Contact a health care provider if: °· You have a rapid weight gain. °· You have increasing shortness of breath that is unusual for you. °· You are unable to participate in your usual physical activities. °· You tire easily. °· You cough more than normal, especially with physical activity. °· You have any swelling or more swelling in areas such as your hands, feet, ankles, or abdomen. °· You are unable to sleep because it is hard to breathe. °· You feel like your heart is beating quickly (palpitations). °· You become dizzy or light-headed when you stand up. °Get help right away if: °· You have difficulty breathing. °· You notice or your family notices a change in your awareness, such as having trouble staying awake or having difficulty  with concentration. °· You have pain or discomfort in your chest. °· You have an episode of fainting (syncope). °This information is not intended to replace advice given to you by your health care provider. Make sure you discuss any questions you have with your health care provider. °Document Released: 10/21/2005 Document Revised: 09/19/2017 Document Reviewed: 05/15/2016 °[employer] Interactive Patient Education © 2019 [employer] ° °

## 2018-11-12 NOTE — Progress Notes (Signed)
Progress Note  Patient Name: Dale [name] Date of Encounter: 11/12/2018  Primary Cardiologist: Kristeen Miss, MD   Subjective   Back to baseline. No dyspnea. Ready to go home.   Inpatient Medications    Scheduled Meds: . carvedilol  6.25 mg Oral BID WC  . enoxaparin (LOVENOX) injection  40 mg Subcutaneous Q24H  . isosorbide-hydrALAZINE  1 tablet Oral TID  . losartan  50 mg Oral Daily   Continuous Infusions:  PRN Meds: acetaminophen, hydrALAZINE, ipratropium-albuterol, traMADol   Vital Signs    Vitals:   11/11/18 1340 11/11/18 2137 11/12/18 0608 11/12/18 0639  BP: 125/87 133/89 (!) 146/101 (!) 129/91  Pulse: 83 85 89   Resp: 18 20 20    Temp: 97.7 F (36.5 C) 97.7 F (36.5 C) 98.3 F (36.8 C)   TempSrc: Oral Oral Oral   SpO2: 95% 97% 95%   Weight:      Height:        Intake/Output Summary (Last 24 hours) at 11/12/2018 1204 Last data filed at 11/12/2018 0743 Gross per 24 hour  Intake 2022 ml  Output 1025 ml  Net 997 ml   Filed Weights   11/09/18 0627 11/10/18 0705 11/11/18 0628  Weight: 104.1 kg 104.6 kg 101.5 kg    Telemetry    NSR 1 4 beat run of NSVT   ECG    Not performed today. - Personally Reviewed  Physical Exam   GEN: middle aged AAM, well developed in No acute distress.   Neck: No JVD Cardiac: RRR, no murmurs, rubs, or gallops.  Respiratory: Clear to auscultation bilaterally. GI: Soft, nontender, non-distended  MS: No edema; No deformity. Neuro:  Nonfocal  Psych: Normal affect   Labs    Chemistry Recent Labs  Lab 11/10/18 0425 11/11/18 0349 11/12/18 0404  NA 141 140 140  K 3.7 3.6 3.8  CL 105 104 106  CO2 27 26 26   GLUCOSE 94 97 103*  BUN 29* 39* 39*  CREATININE 1.41* 1.50* 1.39*  CALCIUM 8.9 8.8* 8.7*  GFRNONAA 56* 52* 57*  GFRAA >60 60* >60  ANIONGAP 9 10 8      Hematology Recent Labs  Lab 11/10/18 0425 11/11/18 0349 11/12/18 0404  WBC 5.4 6.0 6.4  RBC 4.23 4.35 4.16*  HGB 11.9* 11.9* 11.8*  HCT 36.3* 37.1*  36.6*  MCV 85.8 85.3 88.0  MCH 28.1 27.4 28.4  MCHC 32.8 32.1 32.2  RDW 14.6 14.6 14.7  PLT 298 311 292    Cardiac Enzymes Recent Labs  Lab 11/07/18 2234 11/08/18 0421  TROPONINI 0.04* 0.04*   No results for input(s): TROPIPOC in the last 168 hours.   BNP Recent Labs  Lab 11/07/18 2234  BNP 2,330.9*     DDimer No results for input(s): DDIMER in the last 168 hours.   Radiology    No results found.  Cardiac Studies   2D Echo 09/27/18 Study Conclusions  - Left ventricle: The cavity size was mildly dilated. Wall thickness was increased in a pattern of mild LVH. Systolic function was severely reduced. The estimated ejection fraction was in the range of 20% to 25%. Diffuse hypokinesis. Features are consistent with a pseudonormal left ventricular filling pattern, with concomitant abnormal relaxation and increased filling pressure (grade 2 diastolic dysfunction). Doppler parameters are consistent with high ventricular filling pressure. - Aortic valve: There was trivial regurgitation. - Mitral valve: There was mild regurgitation. - Left atrium: The atrium was severely dilated. - Right ventricle: The cavity size was mildly  dilated. Systolic function was mildly reduced. - Right atrium: The atrium was moderately dilated. - Pericardium, extracardiac: A trivial pericardial effusion was identified.  Impressions:  - Severe global reduction in LV systolic function; mild LVH and LVE; moderate diastolic dysfunction; trace AI; mild MR; biatrial enlargement; mild RVE with mild RV dysfunction.  Patient Profile     Dale [name] a 58 y.o.malewith a hx of substance abuse, chronic combined systolic and diastolic congestive heart failure, EF 25% by recent echocardiogram, uncontrolled hypertension, chronic stage II kidney disease and h/o noncompliance,who is being seen today for the evaluation ofacute on chronic systolic congestive heart failureat the  request of Rickey Barbara MD.   Assessment & Plan    1. Acute on Chronic Combined Systolic and Diastolic CHF: Good response to IV diuretics. Euvolemic. Dyspnea resolved. Renal function improved. Transition back to PO diuretics at discharge. Of note, Pt reports poor UOP with home diuretic in the past (was on Lasix 40 mg PO). Will change to Torsemide, 20 mg once daily. We will arrange post hospital f/u in 1-2 weeks and will get repeat BMP to reassess renal function and electrolytes on new diuretic. We discussed checking weight daily for volume status monitoring once he returns home as well as low sodium diet.   2. Cardiomyopathy: Echo 25% by recent echo. Etiology unclear. Previously felt secondary to cocaine abuse, alcohol use or hypertension. He is currently on Coreg, Losartan and Bidil. We do not think given his social history he will be able to afford or comply with Entresto. If he can avoid cocaine and be compliant with medications then would need repeat echocardiogram 3 months after medications fully titrated. If ejection fraction remains reduced then would need ischemia evaluation and could consider ICD at that point but presently not a candidate given recurrent cocaine use. We will continue titration of HF meds, as BP allows, in clinic.   3. NSVT: in the setting of severely reduced LVEF, < 35%. His coreg dose has been increased. 1 4 beat run noted on tele in last 24 hrs. K and Mg are both WNL today at 3.8 and 2.3 respectively. Will obtain f/u BMP in clinic to assess electrolytes on Torsemide.    4. Stage III CKD: SCr improved since yesterday, down from 1.50>>1.39.  Transition back to PO diuretics today. F/u BMP in our office in 1 week.   5. HTN: Continue current regimen on discharge. Coreg, Bidil, Losartan and add Torsemide. Low sodium diet advised.   6. Cocaine Abuse: patient counseled on avoiding  7. Elevated Troponin: flat and low level, at 0.04>>0.04. Not c/w ACS. No plans for  ischemic evaluation at this time.   We will arrange hospital f/u and appt for labs in 1 week. Will place in AVS.    For questions or updates, please contact [facility] Please consult [email or web address] for contact info under        Signed, Robbie Lis, PA-C  11/12/2018, 12:04 PM

## 2018-11-12 NOTE — Discharge Summary (Signed)
[facility]  Physician Discharge Summary   Patient ID: Dale [name] MRN: [id number] DOB/AGE: January 25, 1961 58 y.o.  Admit date: 11/07/2018 Discharge date: 11/12/2018  PCP: [name], FNP  DISCHARGE DIAGNOSES:  Acute systolic CHF Acute kidney injury on chronic kidney disease stage II resolved History of polysubstance abuse Essential hypertension Nonsustained VT  RECOMMENDATIONS FOR OUTPATIENT FOLLOW UP: 1. Cardiology to arrange outpatient follow-up with blood work   DISCHARGE CONDITION: fair  Diet recommendation: Heart healthy  Filed Weights   11/09/18 0627 11/10/18 0705 11/11/18 0628  Weight: 104.1 kg 104.6 kg 101.5 kg    INITIAL HISTORY: 58 y.o.malewithhx of polysubstance abuse (alcohol and cocaine), systolic and diastolic CHF and HFrEF (EF 20-25%) suspected due to HTN (no prior ischemic workup), CKD stage 2 who presented with acute dyspnea x 1 day. Patient states that he noted acute episodes of dyspnea while resting in bed the night prior to admission, making him toss and turn. He then noted progressive shortness of breath while ambulating -- acutely dyspneic with walking across the room. Reports dietary non compliance including eating pizza earlier today. Has been having productive cough with clear-whitish sputum over the past few days and subjective lower extremity edema. Denies chest pressure or pain. Patient reports compliance with lasix 40mg  and losartan 50mg  daily, although he states that he has not noticed significant urine output with the lasix. Of note, patient was also started on coreg and amlodipine in November 2019 when he was hospitalized for ADHF and diagnosed with systolic CHF and HFrEF for the first time; however, patient left AMA without filling his complete medication regimen. His UDS was positive on 09/27/18 for cocaine.  Consultations:  Cardiology  Procedures: ECHO Study Conclusions  - Left ventricle: The cavity size was mildly  dilated. Wall thickness was increased in a pattern of mild LVH. Systolic function was severely reduced. The estimated ejection fraction was in the range of 20% to 25%. Diffuse hypokinesis. Features are consistent with a pseudonormal left ventricular filling pattern, with concomitant abnormal relaxation and increased filling pressure (grade 2 diastolic dysfunction). Doppler parameters are consistent with high ventricular filling pressure. - Aortic valve: There was trivial regurgitation. - Mitral valve: There was mild regurgitation. - Left atrium: The atrium was severely dilated. - Right ventricle: The cavity size was mildly dilated. Systolic function was mildly reduced. - Right atrium: The atrium was moderately dilated. - Pericardium, extracardiac: A trivial pericardial effusion was identified.  Impressions:  - Severe global reduction in LV systolic function; mild LVH and LVE; moderate diastolic dysfunction; trace AI; mild MR; biatrial enlargement; mild RVE with mild RV dysfunction.   HOSPITAL COURSE:   Acute CHF with dyspnea and main presenting symptom.  Patient was hospitalized.  Echocardiogram showed a EF of 20 to 25% which was done back in November.  Patient has never had ischemic work-up previously.  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  Patient was seen by cardiology.  Patient was given intravenous diuretics.  Started on beta-blocker and ARB as well as BiDil.  He will be discharged on oral torsemide.  Patient has been on furosemide previously which he states does not work for him.  AKI on CKD stage 2  Acute renal failure most likely due to cardiorenal.  Renal function is stable.  Hx of polysubstance abuse Patient was positive for cocaine.  He was counseled extensively.  Essential hypertension  Stable.  Continue home medications.  Nonsustained VT  Asymptomatic. Started on beta-blocker.    Overall stable.  Cleared by cardiology  for discharge.   PERTINENT LABS:  The results of significant diagnostics from this hospitalization (including imaging, microbiology, ancillary and laboratory) are listed below for reference.      Labs: Basic Metabolic Panel: Recent Labs  Lab 11/08/18 0421 11/09/18 0457 11/10/18 0425 11/11/18 0349 11/12/18 0404  NA 138 138 141 140 140  K 3.8 3.7 3.7 3.6 3.8  CL 104 104 105 104 106  CO2 24 25 27 26 26   GLUCOSE 103* 101* 94 97 103*  BUN 27* 30* 29* 39* 39*  CREATININE 1.41* 1.47* 1.41* 1.50* 1.39*  CALCIUM 8.7* 8.6* 8.9 8.8* 8.7*  MG  --  2.1 2.1 2.4 2.3   CBC: Recent Labs  Lab 11/08/18 0421 11/09/18 0457 11/10/18 0425 11/11/18 0349 11/12/18 0404  WBC 5.5 5.5 5.4 6.0 6.4  HGB 12.1* 11.4* 11.9* 11.9* 11.8*  HCT 37.4* 35.0* 36.3* 37.1* 36.6*  MCV 86.2 87.9 85.8 85.3 88.0  PLT 293 288 298 311 292   Cardiac Enzymes: Recent Labs  Lab 11/07/18 2234 11/08/18 0421  TROPONINI 0.04* 0.04*   BNP: BNP (last 3 results) Recent Labs    09/27/18 0926 10/15/18 0754 11/07/18 2234  BNP 1,531.1* 1,433.5* 2,330.9*     IMAGING STUDIES Dg Chest 2 View  Result Date: 11/07/2018 CLINICAL DATA:  Patient reports intermittent SOB today. Hx CHF. Reports taking lasix as prescribed. EXAM: CHEST - 2 VIEW COMPARISON:  10/15/2018 FINDINGS: Cardiac silhouette is mildly enlarged. No mediastinal or hilar masses. No evidence of adenopathy. Lungs are clear. Probable minimal right pleural effusion. No pneumothorax. Skeletal structures are intact. IMPRESSION: 1. No acute findings.  No evidence of pneumonia or pulmonary edema. 2. Mild cardiomegaly.  Minimal right pleural effusion. Electronically Signed   By: Amie Portland M.D.   On: 11/07/2018 19:42   Dg Chest 2 View  Result Date: 10/15/2018 CLINICAL DATA:  Shortness of breath. EXAM: CHEST - 2 VIEW COMPARISON:  09/27/2018. FINDINGS: Cardiomegaly with pulmonary venous congestion and mild interstitial prominence again noted. Findings consistent  mild CHF. Interstitial edema is less marked on today's exam than on prior exam. Small bilateral pleural effusions. No pneumothorax. IMPRESSION: Findings consistent with congestive heart failure with mild bilateral pulmonary interstitial edema and small bilateral pleural effusions. Electronically Signed   By: Maisie Fus  Register   On: 10/15/2018 07:39    DISCHARGE EXAMINATION: Vitals:   11/11/18 1340 11/11/18 2137 11/12/18 0608 11/12/18 0639  BP: 125/87 133/89 (!) 146/101 (!) 129/91  Pulse: 83 85 89   Resp: 18 20 20    Temp: 97.7 F (36.5 C) 97.7 F (36.5 C) 98.3 F (36.8 C)   TempSrc: Oral Oral Oral   SpO2: 95% 97% 95%   Weight:      Height:       General appearance: Awake alert.  In no distress Resp: Clear to auscultation bilaterally.  Normal effort Cardio: S1-S2 is normal regular.  No S3-S4.  No rubs murmurs or bruit GI: Abdomen is soft.  Nontender nondistended.  Bowel sounds are present normal.  No masses organomegaly Extremities: No edema.  Full range of motion of lower extremities. Neurologic: Alert and oriented x3.  No focal neurological deficits.    DISPOSITION: Home  Discharge Instructions    (HEART FAILURE PATIENTS) Call MD:  Anytime you have any of the following symptoms: 1) 3 pound weight gain in 24 hours or 5 pounds in 1 week 2) shortness of breath, with or without a dry hacking cough 3) swelling in the hands,  feet or stomach 4) if you have to sleep on extra pillows at night in order to breathe.   Complete by:  As directed    Call MD for:  difficulty breathing, headache or visual disturbances   Complete by:  As directed    Call MD for:  extreme fatigue   Complete by:  As directed    Call MD for:  persistant dizziness or light-headedness   Complete by:  As directed    Call MD for:  persistant nausea and vomiting   Complete by:  As directed    Call MD for:  severe uncontrolled pain   Complete by:  As directed    Call MD for:  temperature >100.4   Complete by:  As  directed    Diet - low sodium heart healthy   Complete by:  As directed    Discharge instructions   Complete by:  As directed    Please take your medications as prescribed.  Please stop recreational drug use.  You were cared for by a hospitalist during your hospital stay. If you have any questions about your discharge medications or the care you received while you were in the hospital after you are discharged, you can call the unit and asked to speak with the hospitalist on call if the hospitalist that took care of you is not available. Once you are discharged, your primary care physician will handle any further medical issues. Please note that NO REFILLS for any discharge medications will be authorized once you are discharged, as it is imperative that you return to your primary care physician (or establish a relationship with a primary care physician if you do not have one) for your aftercare needs so that they can reassess your need for medications and monitor your lab values. If you do not have a primary care physician, you can call [phone] for a physician referral.   Increase activity slowly   Complete by:  As directed         Allergies as of 11/12/2018      Reactions   Hydrocodone Itching      Medication List    STOP taking these medications   furosemide 40 MG tablet Commonly known as:  LASIX     TAKE these medications   carvedilol 6.25 MG tablet Commonly known as:  COREG Take 1 tablet (6.25 mg total) by mouth 2 (two) times daily with a meal.   isosorbide-hydrALAZINE 20-37.5 MG tablet Commonly known as:  BIDIL Take 1 tablet by mouth 3 (three) times daily.   losartan 50 MG tablet Commonly known as:  COZAAR Take 1 tablet (50 mg total) by mouth daily.   potassium chloride SA 20 MEQ tablet Commonly known as:  K-DUR,KLOR-CON Take 1 tablet (20 mEq total) by mouth daily.   torsemide 20 MG tablet Commonly known as:  DEMADEX Take 1 tablet (20 mg total) by mouth daily.          Follow-up Information    [facility] [address] Office Follow up on 11/19/2018.   Specialty:  Cardiology Why:  go to Dr. Harvie BridgeNahser's office on 1/16, anytime between 8am-5pm, for lab work to recheck your kidneys and electrolytes.  Contact information: [address], [address] [place] [id number] [phone]       Nahser, Deloris PingPhilip J, MD Follow up on 12/03/2018.   Specialty:  Cardiology Why:  4:00 PM  Contact information: [address] [place] [address] [phone]  Massie MaroonHollis, Lachina M, FNP. Schedule an appointment as soon as possible for a visit in 1 week(s).   Specialty:  Family Medicine Contact information: [address] [place] [address] [phone]           TOTAL DISCHARGE TIME: 35 minutes  Osvaldo ShipperGokul Fredna Stricker  [facility] Pager [phone]  11/12/2018, 3:18 PM

## 2018-11-12 NOTE — Progress Notes (Signed)
Order placed for BMP on 11/19/18.

## 2018-11-13 ENCOUNTER — Other Ambulatory Visit: Payer: Self-pay

## 2018-11-27 ENCOUNTER — Ambulatory Visit: Payer: Self-pay | Admitting: Internal Medicine

## 2018-12-03 ENCOUNTER — Ambulatory Visit: Payer: Self-pay | Admitting: Cardiovascular Disease

## 2018-12-04 ENCOUNTER — Encounter: Payer: Self-pay | Admitting: Cardiovascular Disease

## 2018-12-25 ENCOUNTER — Emergency Department (HOSPITAL_COMMUNITY)
Admission: EM | Admit: 2018-12-25 | Discharge: 2018-12-25 | Disposition: A | Discharge status: Home / Self Care | Payer: Medicaid Other | Attending: Emergency Medicine | Admitting: Emergency Medicine

## 2018-12-25 ENCOUNTER — Emergency Department (HOSPITAL_COMMUNITY): Payer: Medicaid Other

## 2018-12-25 ENCOUNTER — Encounter (HOSPITAL_COMMUNITY): Payer: Self-pay | Admitting: Emergency Medicine

## 2018-12-25 DIAGNOSIS — M542 Cervicalgia: Secondary | ICD-10-CM | POA: Diagnosis present

## 2018-12-25 DIAGNOSIS — I5042 Chronic combined systolic (congestive) and diastolic (congestive) heart failure: Secondary | ICD-10-CM | POA: Insufficient documentation

## 2018-12-25 DIAGNOSIS — I130 Hypertensive heart and chronic kidney disease with heart failure and stage 1 through stage 4 chronic kidney disease, or unspecified chronic kidney disease: Secondary | ICD-10-CM | POA: Diagnosis not present

## 2018-12-25 DIAGNOSIS — N182 Chronic kidney disease, stage 2 (mild): Secondary | ICD-10-CM | POA: Diagnosis not present

## 2018-12-25 DIAGNOSIS — Z79899 Other long term (current) drug therapy: Secondary | ICD-10-CM | POA: Insufficient documentation

## 2018-12-25 MED ORDER — CYCLOBENZAPRINE HCL 10 MG PO TABS: 10.0000 mg | ORAL_TABLET | Freq: Two times a day (BID) | ORAL | 0 refills | Status: AC | PRN

## 2018-12-25 MED ORDER — CYCLOBENZAPRINE HCL 10 MG PO TABS
10.0000 mg | ORAL_TABLET | Freq: Once | ORAL | Status: AC
  Administered 2018-12-25: 10 mg via ORAL
  Filled 2018-12-25: qty 1

## 2018-12-25 MED ORDER — ACETAMINOPHEN 500 MG PO TABS
1000.0000 mg | ORAL_TABLET | Freq: Once | ORAL | Status: AC
  Administered 2018-12-25: 1000 mg via ORAL
  Filled 2018-12-25: qty 2
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
## 2018-12-25 NOTE — ED Notes (Addendum)
Pt is transported back from CT.  He reports his pain is a little better, rates it at 7/10.  He states he needs to get home, explained to pt that he needs to wait on his CT results first

## 2018-12-25 NOTE — Discharge Instructions (Addendum)
Today you received medications that may make you sleepy or impair your ability to make decisions.  For the next 24 hours please do not drive, operate heavy machinery, care for a small child with out another adult present, or perform any activities that may cause harm to you or someone else if you were to fall asleep or be impaired.   You are being prescribed a medication which may make you sleepy. Please follow up of listed precautions for at least 24 hours after taking one dose.  
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
## 2018-12-31 MED FILL — CARVEDILOL 6.25 MG TABLET: 6.25 | 30 days supply | Qty: 60 | Fill #1

## 2018-12-31 MED FILL — TORSEMIDE 20 MG TABLET: 20 | 30 days supply | Qty: 30 | Fill #1

## 2018-12-31 MED FILL — !BIDIL TABLET: 20-37.5MG | 30 days supply | Qty: 90 | Fill #1

## 2019-02-28 ENCOUNTER — Inpatient Hospital Stay (HOSPITAL_COMMUNITY)
Admission: EM | Admit: 2019-02-28 | Discharge: 2019-03-03 | DRG: 280 | Disposition: A | Discharge status: Home / Self Care | Payer: Medicaid Other | Attending: Internal Medicine | Admitting: Internal Medicine

## 2019-02-28 ENCOUNTER — Encounter (HOSPITAL_COMMUNITY): Payer: Self-pay

## 2019-02-28 ENCOUNTER — Other Ambulatory Visit: Payer: Self-pay

## 2019-02-28 ENCOUNTER — Emergency Department (HOSPITAL_COMMUNITY): Payer: Medicaid Other

## 2019-02-28 DIAGNOSIS — F1920 Other psychoactive substance dependence, uncomplicated: Secondary | ICD-10-CM

## 2019-02-28 DIAGNOSIS — F4310 Post-traumatic stress disorder, unspecified: Secondary | ICD-10-CM | POA: Diagnosis present

## 2019-02-28 DIAGNOSIS — I472 Ventricular tachycardia: Secondary | ICD-10-CM | POA: Diagnosis not present

## 2019-02-28 DIAGNOSIS — Z590 Homelessness: Secondary | ICD-10-CM | POA: Diagnosis not present

## 2019-02-28 DIAGNOSIS — F329 Major depressive disorder, single episode, unspecified: Secondary | ICD-10-CM | POA: Diagnosis present

## 2019-02-28 DIAGNOSIS — Z9111 Patient's noncompliance with dietary regimen: Secondary | ICD-10-CM | POA: Diagnosis not present

## 2019-02-28 DIAGNOSIS — Z885 Allergy status to narcotic agent status: Secondary | ICD-10-CM | POA: Diagnosis not present

## 2019-02-28 DIAGNOSIS — I130 Hypertensive heart and chronic kidney disease with heart failure and stage 1 through stage 4 chronic kidney disease, or unspecified chronic kidney disease: Secondary | ICD-10-CM | POA: Diagnosis present

## 2019-02-28 DIAGNOSIS — F419 Anxiety disorder, unspecified: Secondary | ICD-10-CM

## 2019-02-28 DIAGNOSIS — Z811 Family history of alcohol abuse and dependence: Secondary | ICD-10-CM | POA: Diagnosis not present

## 2019-02-28 DIAGNOSIS — I5043 Acute on chronic combined systolic (congestive) and diastolic (congestive) heart failure: Secondary | ICD-10-CM | POA: Diagnosis present

## 2019-02-28 DIAGNOSIS — F32A Depression, unspecified: Secondary | ICD-10-CM

## 2019-02-28 DIAGNOSIS — F1410 Cocaine abuse, uncomplicated: Secondary | ICD-10-CM | POA: Diagnosis present

## 2019-02-28 DIAGNOSIS — F1010 Alcohol abuse, uncomplicated: Secondary | ICD-10-CM | POA: Diagnosis present

## 2019-02-28 DIAGNOSIS — I110 Hypertensive heart disease with heart failure: Secondary | ICD-10-CM

## 2019-02-28 DIAGNOSIS — E785 Hyperlipidemia, unspecified: Secondary | ICD-10-CM

## 2019-02-28 DIAGNOSIS — Z9119 Patient's noncompliance with other medical treatment and regimen: Secondary | ICD-10-CM

## 2019-02-28 DIAGNOSIS — D631 Anemia in chronic kidney disease: Secondary | ICD-10-CM | POA: Diagnosis present

## 2019-02-28 DIAGNOSIS — N183 Chronic kidney disease, stage 3 (moderate): Secondary | ICD-10-CM | POA: Diagnosis present

## 2019-02-28 DIAGNOSIS — Z8249 Family history of ischemic heart disease and other diseases of the circulatory system: Secondary | ICD-10-CM | POA: Diagnosis not present

## 2019-02-28 DIAGNOSIS — I10 Essential (primary) hypertension: Secondary | ICD-10-CM | POA: Diagnosis present

## 2019-02-28 DIAGNOSIS — N179 Acute kidney failure, unspecified: Secondary | ICD-10-CM | POA: Diagnosis present

## 2019-02-28 DIAGNOSIS — I427 Cardiomyopathy due to drug and external agent: Secondary | ICD-10-CM | POA: Diagnosis present

## 2019-02-28 DIAGNOSIS — I214 Non-ST elevation (NSTEMI) myocardial infarction: Secondary | ICD-10-CM | POA: Diagnosis present

## 2019-02-28 DIAGNOSIS — T405X1A Poisoning by cocaine, accidental (unintentional), initial encounter: Secondary | ICD-10-CM | POA: Diagnosis present

## 2019-02-28 DIAGNOSIS — N171 Acute kidney failure with acute cortical necrosis: Secondary | ICD-10-CM

## 2019-02-28 DIAGNOSIS — I4729 Other ventricular tachycardia: Secondary | ICD-10-CM

## 2019-02-28 DIAGNOSIS — I5021 Acute systolic (congestive) heart failure: Secondary | ICD-10-CM | POA: Diagnosis present

## 2019-02-28 DIAGNOSIS — N182 Chronic kidney disease, stage 2 (mild): Secondary | ICD-10-CM | POA: Diagnosis present

## 2019-02-28 LAB — CBC WITH DIFFERENTIAL/PLATELET
Abs Immature Granulocytes: 0.03 10*3/uL (ref 0.00–0.07)
Basophils Absolute: 0 10*3/uL (ref 0.0–0.1)
Basophils Relative: 1 %
Eosinophils Absolute: 0 10*3/uL (ref 0.0–0.5)
Eosinophils Relative: 1 %
HCT: 36 % — ABNORMAL LOW (ref 39.0–52.0)
Hemoglobin: 11.6 g/dL — ABNORMAL LOW (ref 13.0–17.0)
Immature Granulocytes: 0 %
Lymphocytes Relative: 19 %
Lymphs Abs: 1.3 10*3/uL (ref 0.7–4.0)
MCH: 28 pg (ref 26.0–34.0)
MCHC: 32.2 g/dL (ref 30.0–36.0)
MCV: 87 fL (ref 80.0–100.0)
Monocytes Absolute: 0.5 10*3/uL (ref 0.1–1.0)
Monocytes Relative: 8 %
Neutro Abs: 5 10*3/uL (ref 1.7–7.7)
Neutrophils Relative %: 71 %
Platelets: 315 10*3/uL (ref 150–400)
RBC: 4.14 MIL/uL — ABNORMAL LOW (ref 4.22–5.81)
RDW: 15.8 % — ABNORMAL HIGH (ref 11.5–15.5)
WBC: 7 10*3/uL (ref 4.0–10.5)
nRBC: 0 % (ref 0.0–0.2)

## 2019-02-28 LAB — URINALYSIS, ROUTINE W REFLEX MICROSCOPIC
Bilirubin Urine: NEGATIVE
Glucose, UA: NEGATIVE mg/dL
Hgb urine dipstick: NEGATIVE
Ketones, ur: NEGATIVE mg/dL
Leukocytes,Ua: NEGATIVE
Nitrite: NEGATIVE
Protein, ur: NEGATIVE mg/dL
Specific Gravity, Urine: 1.004 — ABNORMAL LOW (ref 1.005–1.030)
pH: 7 (ref 5.0–8.0)

## 2019-02-28 LAB — COMPREHENSIVE METABOLIC PANEL
ALT: 100 U/L — ABNORMAL HIGH (ref 0–44)
AST: 111 U/L — ABNORMAL HIGH (ref 15–41)
Albumin: 3 g/dL — ABNORMAL LOW (ref 3.5–5.0)
Alkaline Phosphatase: 99 U/L (ref 38–126)
Anion gap: 8 (ref 5–15)
BUN: 27 mg/dL — ABNORMAL HIGH (ref 6–20)
CO2: 23 mmol/L (ref 22–32)
Calcium: 8.1 mg/dL — ABNORMAL LOW (ref 8.9–10.3)
Chloride: 103 mmol/L (ref 98–111)
Creatinine, Ser: 1.45 mg/dL — ABNORMAL HIGH (ref 0.61–1.24)
GFR calc Af Amer: >60 mL/min (ref >60)
GFR calc non Af Amer: 54 mL/min — ABNORMAL LOW (ref >60)
Glucose, Bld: 103 mg/dL — ABNORMAL HIGH (ref 70–99)
Potassium: 4 mmol/L (ref 3.5–5.1)
Sodium: 134 mmol/L — ABNORMAL LOW (ref 135–145)
Total Bilirubin: 1 mg/dL (ref 0.3–1.2)
Total Protein: 5.9 g/dL — ABNORMAL LOW (ref 6.5–8.1)

## 2019-02-28 LAB — TROPONIN I
Troponin I: 8.9 ng/mL (ref <0.03)
Troponin I: 7.04 ng/mL (ref <0.03)
Troponin I: 5.36 ng/mL (ref <0.03)

## 2019-02-28 LAB — HEPARIN LEVEL (UNFRACTIONATED): Heparin Unfractionated: <0.1 IU/mL — ABNORMAL LOW (ref 0.30–0.70)

## 2019-02-28 LAB — RAPID URINE DRUG SCREEN, HOSP PERFORMED
Amphetamines: NOT DETECTED
Barbiturates: NOT DETECTED
Benzodiazepines: NOT DETECTED
Cocaine: POSITIVE — AB
Opiates: NOT DETECTED
Tetrahydrocannabinol: NOT DETECTED

## 2019-02-28 LAB — BRAIN NATRIURETIC PEPTIDE: B Natriuretic Peptide: 2106 pg/mL — ABNORMAL HIGH (ref 0.0–100.0)

## 2019-02-28 MED ORDER — ISOSORB DINITRATE-HYDRALAZINE 20-37.5 MG PO TABS
1.0000 | ORAL_TABLET | Freq: Three times a day (TID) | ORAL | Status: DC
  Administered 2019-02-28 – 2019-03-03 (×9): 1 via ORAL
  Filled 2019-02-28 (×9): qty 1

## 2019-02-28 MED ORDER — LOSARTAN POTASSIUM 50 MG PO TABS
50.0000 mg | ORAL_TABLET | Freq: Every day | ORAL | Status: DC
  Administered 2019-02-28: 50 mg via ORAL
  Filled 2019-02-28: qty 1

## 2019-02-28 MED ORDER — HEPARIN (PORCINE) 25000 UT/250ML-% IV SOLN
1700.0000 [IU]/h | INTRAVENOUS | Status: DC
  Administered 2019-02-28: 1250 [IU]/h via INTRAVENOUS
  Administered 2019-03-01: 1700 [IU]/h via INTRAVENOUS
  Administered 2019-03-01: 06:00:00 1500 [IU]/h via INTRAVENOUS
  Filled 2019-02-28 (×3): qty 250

## 2019-02-28 MED ORDER — CARVEDILOL 6.25 MG PO TABS
6.2500 mg | ORAL_TABLET | Freq: Two times a day (BID) | ORAL | Status: DC
  Administered 2019-02-28 – 2019-03-02 (×5): 6.25 mg via ORAL
  Filled 2019-02-28 (×5): qty 1

## 2019-02-28 MED ORDER — HEPARIN BOLUS VIA INFUSION
4000.0000 [IU] | Freq: Once | INTRAVENOUS | Status: AC
  Administered 2019-02-28: 4000 [IU] via INTRAVENOUS
  Filled 2019-02-28: qty 4000

## 2019-02-28 MED ORDER — ASPIRIN 81 MG PO CHEW
324.0000 mg | CHEWABLE_TABLET | Freq: Once | ORAL | Status: AC
  Administered 2019-02-28: 324 mg via ORAL
  Filled 2019-02-28: qty 4

## 2019-02-28 MED ORDER — FUROSEMIDE 10 MG/ML IJ SOLN
80.0000 mg | Freq: Two times a day (BID) | INTRAMUSCULAR | Status: DC
  Administered 2019-02-28: 18:00:00 80 mg via INTRAVENOUS
  Filled 2019-02-28: qty 8

## 2019-02-28 MED ORDER — FUROSEMIDE 10 MG/ML IJ SOLN
40.0000 mg | Freq: Once | INTRAMUSCULAR | Status: AC
  Administered 2019-02-28: 40 mg via INTRAVENOUS
  Filled 2019-02-28: qty 4

## 2019-02-28 NOTE — ED Notes (Signed)
ED TO INPATIENT HANDOFF REPORT  ED Nurse Name and Phone #: [phone]  S Name/Age/Gender Dale [name] 58 y.o. male Room/Bed: WA22/WA22  Code Status   Code Status: Prior  Home/SNF/Other Hotel (living place) Patient oriented to: self, place, time and situation Is this baseline? Yes   Triage Complete: Triage complete  Chief Complaint  CHF / [facility]  Triage Note Patient presented to ed with c/o shortness of breath for two days. Patient state he been out of his lasix for 3 days. Patient legs are swollen than normal and has chest pain.   Allergies Allergies  Allergen Reactions  . Hydrocodone Itching    Level of Care/Admitting Diagnosis ED Disposition    ED Disposition Condition Comment   Admit  Hospital Area: [facility] [[id number]]  Level of Care: Telemetry Cardiac [103]  Covid Evaluation: N/A  Diagnosis: NSTEMI (non-ST elevated myocardial infarction) [facility]) [[id number]]  Admitting Physician: Parke Poisson [[id number]]  Attending Physician: Parke Poisson [[id number]]  Estimated length of stay: past midnight tomorrow  Certification:: I certify this patient will need inpatient services for at least 2 midnights  PT Class (Do Not Modify): Inpatient [101]  PT Acc Code (Do Not Modify): Private [1]       B Medical/Surgery History Past Medical History:  Diagnosis Date  . Alcohol abuse   . Chronic combined systolic and diastolic CHF (congestive heart failure) (HCC)    a. 09/27/18 showed mild LVH, EF 20-25%, grade 2 DD, mild MR, severely dilated LA, mildly dilated RV with mildly reduced RV function, mod RAE.  Marland Kitchen Cocaine abuse (HCC)   . Hypertension    History reviewed. No pertinent surgical history.   A IV Location/Drains/Wounds Patient Lines/Drains/Airways Status   Active Line/Drains/Airways    Name:   Placement date:   Placement time:   Site:   Days:   Peripheral IV 02/28/19 Right Antecubital   02/28/19    1021    Antecubital   less than 1           Intake/Output Last 24 hours No intake or output data in the 24 hours ending 02/28/19 1440  Labs/Imaging Results for orders placed or performed during the hospital encounter of 02/28/19 (from the past 48 hour(s))  Brain natriuretic peptide     Status: Abnormal   Collection Time: 02/28/19  9:54 AM  Result Value Ref Range   B Natriuretic Peptide 2,106.0 (H) 0.0 - 100.0 pg/mL    Comment: Performed at [facility], [address], [place], [place] [address]  Comprehensive metabolic panel     Status: Abnormal   Collection Time: 02/28/19 10:25 AM  Result Value Ref Range   Sodium 134 (L) 135 - 145 mmol/L   Potassium 4.0 3.5 - 5.1 mmol/L   Chloride 103 98 - 111 mmol/L   CO2 23 22 - 32 mmol/L   Glucose, Bld 103 (H) 70 - 99 mg/dL   BUN 27 (H) 6 - 20 mg/dL   Creatinine, Ser 9.03 (H) 0.61 - 1.24 mg/dL   Calcium 8.1 (L) 8.9 - 10.3 mg/dL   Total Protein 5.9 (L) 6.5 - 8.1 g/dL   Albumin 3.0 (L) 3.5 - 5.0 g/dL   AST 009 (H) 15 - 41 U/L   ALT 100 (H) 0 - 44 U/L   Alkaline Phosphatase 99 38 - 126 U/L   Total Bilirubin 1.0 0.3 - 1.2 mg/dL   GFR calc non Af Amer 54 (L) >60 mL/min   GFR calc Af  Amer >60 >60 mL/min   Anion gap 8 5 - 15    Comment: Performed at [facility], [address] [place], [place] [address]  CBC with Differential     Status: Abnormal   Collection Time: 02/28/19 10:25 AM  Result Value Ref Range   WBC 7.0 4.0 - 10.5 K/uL   RBC 4.14 (L) 4.22 - 5.81 MIL/uL   Hemoglobin 11.6 (L) 13.0 - 17.0 g/dL   HCT 60.4 (L) 54.0 - 98.1 %   MCV 87.0 80.0 - 100.0 fL   MCH 28.0 26.0 - 34.0 pg   MCHC 32.2 30.0 - 36.0 g/dL   RDW 19.1 (H) 47.8 - 29.5 %   Platelets 315 150 - 400 K/uL   nRBC 0.0 0.0 - 0.2 %   Neutrophils Relative % 71 %   Neutro Abs 5.0 1.7 - 7.7 K/uL   Lymphocytes Relative 19 %   Lymphs Abs 1.3 0.7 - 4.0 K/uL   Monocytes Relative 8 %   Monocytes Absolute 0.5 0.1 - 1.0 K/uL   Eosinophils Relative 1 %   Eosinophils Absolute 0.0  0.0 - 0.5 K/uL   Basophils Relative 1 %   Basophils Absolute 0.0 0.0 - 0.1 K/uL   Immature Granulocytes 0 %   Abs Immature Granulocytes 0.03 0.00 - 0.07 K/uL    Comment: Performed at [facility], [address] [place], [place] [address]  Troponin I - Once     Status: Abnormal   Collection Time: 02/28/19 10:25 AM  Result Value Ref Range   Troponin I 8.90 (HH) <0.03 ng/mL    Comment: CRITICAL RESULT CALLED TO, READ BACK BY AND VERIFIED WITH: P.DOWD,RN [id number]  BY V.WILKINS REPEATED TO VERIFY Performed at [facility], [address], [place], [place] [address]   Urinalysis, Routine w reflex microscopic     Status: Abnormal   Collection Time: 02/28/19  1:09 PM  Result Value Ref Range   Color, Urine COLORLESS (A) YELLOW   APPearance CLEAR CLEAR   Specific Gravity, Urine 1.004 (L) 1.005 - 1.030   pH 7.0 5.0 - 8.0   Glucose, UA NEGATIVE NEGATIVE mg/dL   Hgb urine dipstick NEGATIVE NEGATIVE   Bilirubin Urine NEGATIVE NEGATIVE   Ketones, ur NEGATIVE NEGATIVE mg/dL   Protein, ur NEGATIVE NEGATIVE mg/dL   Nitrite NEGATIVE NEGATIVE   Leukocytes,Ua NEGATIVE NEGATIVE    Comment: Performed at [facility], [address] [place], [place] [address]   Dg Chest 2 View  Result Date: 02/28/2019 CLINICAL DATA:  Shortness of breath.  Cough. EXAM: CHEST - 2 VIEW COMPARISON:  December 25, 2018 FINDINGS: Cardiomegaly. Hila, mediastinum, lungs, and pleura are otherwise normal. IMPRESSION: Cardiomegaly.  No other abnormalities. Electronically Signed   By: Gerome Sam III M.D   On: 02/28/2019 12:01    Pending Labs Unresulted Labs (From admission, onward)    Start     Ordered   02/28/19 1309  Urine rapid drug screen (hosp performed)  ONCE - STAT,   STAT     02/28/19 1308          Vitals/Pain Today's Vitals   02/28/19 1008 02/28/19 1030 02/28/19 1344 02/28/19 1400  BP:  (!) 134/107 (!) 151/119 (!) 164/108  Pulse:  81 80 80   Resp:  Temp:      TempSrc:      SpO2:  98% 97% 94%  Weight: 104.3 kg     Height:  5\' 11"  (1.803 m)     PainSc: 0-No pain       Isolation Precautions No active isolations  Medications Medications  heparin ADULT infusion 100 units/mL (25000 units/246mL sodium chloride 0.45%) (1,250 Units/hr Intravenous New Bag/Given 02/28/19 1342)  furosemide (LASIX) injection 40 mg (40 mg Intravenous Given 02/28/19 1022)  aspirin chewable tablet 324 mg (324 mg Oral Given 02/28/19 1335)  heparin bolus via infusion 4,000 Units (4,000 Units Intravenous Bolus from Bag 02/28/19 1342)    Mobility walks Low fall risk   Focused Assessments Cardiac Assessment Handoff:  Cardiac Rhythm: Normal sinus rhythm Lab Results  Component Value Date   TROPONINI 8.90 (HH) 02/28/2019   No results found for: DDIMER Does the Patient currently have chest pain? No     R Recommendations: See Admitting Provider Note  Report given to:   Additional Notes:

## 2019-02-28 NOTE — Progress Notes (Signed)
ANTICOAGULATION CONSULT NOTE - Initial Consult  Pharmacy Consult for heparin  Indication: chest pain/ACS  Allergies  Allergen Reactions  . Hydrocodone Itching    Patient Measurements: Height: 5\' 11"  (180.3 cm) Weight: 230 lb (104.3 kg) IBW/kg (Calculated) : 75.3 Heparin Dosing Weight: 97.2 kg  Vital Signs: Temp: 97.7 F (36.5 C) (04/26 0934) Temp Source: Oral (04/26 0934) BP: 134/107 (04/26 1030) Pulse Rate: 81 (04/26 1030)  Labs: Recent Labs    02/28/19 1025  HGB 11.6*  HCT 36.0*  PLT 315  CREATININE 1.45*  TROPONINI 8.90*    Estimated Creatinine Clearance: 70.8 mL/min (A) (by C-G formula based on SCr of 1.45 mg/dL (H)).   Medical History: Past Medical History:  Diagnosis Date  . Alcohol abuse   . Chronic combined systolic and diastolic CHF (congestive heart failure) (HCC)    a. 09/27/18 showed mild LVH, EF 20-25%, grade 2 DD, mild MR, severely dilated LA, mildly dilated RV with mildly reduced RV function, mod RAE.  Marland Kitchen Cocaine abuse (HCC)   . Hypertension     Assessment: 58 yo man with positive troponin to start heparin per pharmacy.  No anticoagulants PTA.  TBW 104.3 kg, HDW 97.2 kg. SCr 1.45. Hg 11.6 PLTC 325.   No bleeding reported.   Goal of Therapy:  Heparin level 0.3-0.7 units/ml Monitor platelets by anticoagulation protocol: Yes   Plan:  Give 4000 units bolus x 1 Start heparin infusion at 1250 units/hr Check anti-Xa level in 6 hours and daily while on heparin Continue to monitor H&H and platelets  Herby Abraham, Pharm.D 02/28/2019 1:26 PM

## 2019-02-28 NOTE — ED Provider Notes (Signed)
[facility]-EMERGENCY DEPT Provider Note   CSN: [id number] Arrival date & time: 02/28/19  0920    History   Chief Complaint No chief complaint on file.   HPI Dale [name] is a 58 y.o. male.     Patient is a 58 year old male with past medical history of congestive heart failure chronic renal insufficiency, anemia, and hypertension.  He presents today for evaluation of shortness of breath.  He describes orthopnea and dyspnea on exertion that has worsened over the past 3 days.  He denies any chest pain, fevers, chills, or cough.  Patient has also been out of his diuretic for the past several days.  The history is provided by the patient.  Shortness of Breath  Severity:  Moderate Onset quality:  Gradual Timing:  Constant Progression:  Worsening Chronicity:  Recurrent Context: activity   Relieved by:  Nothing Worsened by:  Exertion (Lying flat) Ineffective treatments:  None tried   Past Medical History:  Diagnosis Date  . Alcohol abuse   . Chronic combined systolic and diastolic CHF (congestive heart failure) (HCC)    a. 09/27/18 showed mild LVH, EF 20-25%, grade 2 DD, mild MR, severely dilated LA, mildly dilated RV with mildly reduced RV function, mod RAE.  Marland Kitchen Cocaine abuse (HCC)   . Hypertension     Patient Active Problem List   Diagnosis Date Noted  . Acute systolic heart failure (HCC) 11/08/2018  . Hypertensive urgency 09/28/2018  . Acute pulmonary edema (HCC) 09/28/2018  . Cocaine abuse (HCC) 09/28/2018  . Substance abuse (HCC) 09/28/2018  . Chronic kidney disease, stage II (mild) 09/28/2018  . Normochromic anemia 09/28/2018  . Acute systolic CHF (congestive heart failure) (HCC) 09/27/2018  . Essential hypertension 03/11/2016  . Obesity 03/11/2016  . Glucosuria 03/11/2016    No past surgical history on file.      Home Medications    Prior to Admission medications   Medication Sig Start Date End Date Taking? Authorizing Provider   carvedilol (COREG) 6.25 MG tablet Take 1 tablet (6.25 mg total) by mouth 2 (two) times daily with a meal. 11/12/18   Osvaldo Shipper, MD  isosorbide-hydrALAZINE (BIDIL) 20-37.5 MG tablet Take 1 tablet by mouth 3 (three) times daily. 11/12/18   Osvaldo Shipper, MD  losartan (COZAAR) 50 MG tablet Take 1 tablet (50 mg total) by mouth daily. 10/05/18 01/03/19  Rosalio Macadamia, NP  potassium chloride SA (K-DUR,KLOR-CON) 20 MEQ tablet Take 1 tablet (20 mEq total) by mouth daily. 10/05/18   Rosalio Macadamia, NP  torsemide (DEMADEX) 20 MG tablet Take 1 tablet (20 mg total) by mouth daily. 11/12/18   Osvaldo Shipper, MD    Family History Family History  Problem Relation Age of Onset  . Hypertension Maternal Grandmother     Social History Social History   Tobacco Use  . Smoking status: Never Smoker  . Smokeless tobacco: Never Used  Substance Use Topics  . Alcohol use: Yes    Comment: 2 quarts a week   . Drug use: Yes    Frequency: 3.0 times per week    Types: Marijuana, Cocaine     Allergies   Hydrocodone   Review of Systems Review of Systems  Respiratory: Positive for shortness of breath.   All other systems reviewed and are negative.    Physical Exam Updated Vital Signs BP (!) 164/126 (BP Location: Right Arm)   Pulse 78   Temp 97.7 F (36.5 C) (Oral)   Resp 18  SpO2 99%   Physical Exam Vitals signs and nursing note reviewed.  Constitutional:      General: He is not in acute distress.    Appearance: He is well-developed. He is not diaphoretic.  HENT:     Head: Normocephalic and atraumatic.  Neck:     Musculoskeletal: Normal range of motion and neck supple.  Cardiovascular:     Rate and Rhythm: Normal rate and regular rhythm.     Heart sounds: No murmur. No friction rub.  Pulmonary:     Effort: Pulmonary effort is normal. No respiratory distress.     Breath sounds: Rales present. No wheezing.     Comments: There are slight rales in the bases bilaterally. Abdominal:      General: Bowel sounds are normal. There is no distension.     Palpations: Abdomen is soft.     Tenderness: There is no abdominal tenderness.  Musculoskeletal: Normal range of motion.     Right lower leg: Edema present.     Left lower leg: Edema present.     Comments: There is 1+ pitting edema of both lower extremities  Skin:    General: Skin is warm and dry.  Neurological:     Mental Status: He is alert and oriented to person, place, and time.     Coordination: Coordination normal.      ED Treatments / Results  Labs (all labs ordered are listed, but only abnormal results are displayed) Labs Reviewed  COMPREHENSIVE METABOLIC PANEL  CBC WITH DIFFERENTIAL/PLATELET  TROPONIN I  BRAIN NATRIURETIC PEPTIDE    EKG EKG Interpretation  Date/Time:  Sunday February 28 2019 09:37:42 EDT Ventricular Rate:  80 PR Interval:    QRS Duration: 106 QT Interval:  411 QTC Calculation: 475 R Axis:   28 Text Interpretation:  Sinus rhythm Probable left atrial enlargement Abnormal T, consider ischemia, lateral leads Confirmed by Geoffery Lyons ([id number]) on 02/28/2019 9:44:28 AM   Radiology No results found.  Procedures Procedures (including critical care time)  Medications Ordered in ED Medications  furosemide (LASIX) injection 40 mg (has no administration in time range)     Initial Impression / Assessment and Plan / ED Course  I have reviewed the triage vital signs and the nursing notes.  Pertinent labs & imaging results that were available during my care of the patient were reviewed by me and considered in my medical decision making (see chart for details).  Patient with history of congestive heart failure presenting with a 3-day history of shortness of breath and intermittent chest discomfort.  His EKG shows no acute changes.  He does have an elevated BNP, but no findings for pulmonary edema on his chest x-ray.  Troponin has returned markedly elevated at 8.9 with essentially normal renal  function.  This finding was discussed with Dr. Jacques Navy from cardiology at [facility].  She has agreed to accept the patient in transfer.  He will be given aspirin, started on heparin, and transferred for further evaluation.  CRITICAL CARE Performed by: Geoffery Lyons Total critical care time: 45 minutes Critical care time was exclusive of separately billable procedures and treating other patients. Critical care was necessary to treat or prevent imminent or life-threatening deterioration. Critical care was time spent personally by me on the following activities: development of treatment plan with patient and/or surrogate as well as nursing, discussions with consultants, evaluation of patient's response to treatment, examination of patient, obtaining history from patient or surrogate, ordering and performing treatments and interventions,  ordering and review of laboratory studies, ordering and review of radiographic studies, pulse oximetry and re-evaluation of patient's condition.   Final Clinical Impressions(s) / ED Diagnoses   Final diagnoses:  None    ED Discharge Orders    None       Geoffery Lyons, MD 02/28/19 1316

## 2019-02-28 NOTE — H&P (Addendum)
Cardiology Admission History and Physical:   Patient ID: Dale [name] MRN: [id number]; DOB: [birth year]   Admission date: 02/28/2019  Primary Care Provider: [name], FNP Primary Cardiologist: Kristeen Miss, MD   Chief Complaint:  DOE, CP  Patient Profile:   Dale [name] is a 58 y.o. male with h/o CPM of unknown etiology coming with worsening DOE, CP and elevated troponin.  History of Present Illness:   Dale [name] with h/o uncontrolled hypertension and polysubstance abuse including cocaine and alcohol, who has been followed by Dr Elease Hashimoto for chronic systolic CHF. He was admitted in January 2020 with acute on chronic combined systolic diastolic CHF. Found to be hypertensive and with associated pulmonary edema. He was diuresed.  Had not been on any hypertensive regimen at home due to lack of insurance and no PCP. He had continued to use cocaine prior to the admission. Lots of heavy alcohol use. Echo with EF of 25% with grade 2 DD. Left AMA. Homeless, lives in a long-term hotel settings, noncompliant with diet, he states that he tries to eat solids but mostly either [employer].  He presented today with progressively worsening dyspnea on exertion now at rest, two-pillow orthopnea no proximal nocturnal dyspnea and significant lower extremity edema.  This has progressed over last 2 weeks and was most significant in the last 3 days, since yesterday he has also developed chest pain that felt like pressure with no radiation.  No palpitation dizziness or syncope.  Per patient he was compliant with medications.   Past Medical History:  Diagnosis Date  . Alcohol abuse   . Chronic combined systolic and diastolic CHF (congestive heart failure) (HCC)    a. 09/27/18 showed mild LVH, EF 20-25%, grade 2 DD, mild MR, severely dilated LA, mildly dilated RV with mildly reduced RV function, mod RAE.  Marland Kitchen Cocaine abuse (HCC)   . Hypertension     History reviewed. No pertinent surgical history.    Medications Prior to Admission: Prior to Admission medications   Medication Sig Start Date End Date Taking? Authorizing Provider  carvedilol (COREG) 6.25 MG tablet Take 1 tablet (6.25 mg total) by mouth 2 (two) times daily with a meal. 11/12/18  Yes Osvaldo Shipper, MD  isosorbide-hydrALAZINE (BIDIL) 20-37.5 MG tablet Take 1 tablet by mouth 3 (three) times daily. 11/12/18  Yes Osvaldo Shipper, MD  losartan (COZAAR) 50 MG tablet Take 1 tablet (50 mg total) by mouth daily. 10/05/18 02/28/19 Yes Rosalio Macadamia, NP  potassium chloride SA (K-DUR,KLOR-CON) 20 MEQ tablet Take 1 tablet (20 mEq total) by mouth daily. 10/05/18  Yes Rosalio Macadamia, NP  torsemide (DEMADEX) 20 MG tablet Take 1 tablet (20 mg total) by mouth daily. 11/12/18  Yes Osvaldo Shipper, MD     Allergies:    Allergies  Allergen Reactions  . Hydrocodone Itching    Social History:   Social History   Socioeconomic History  . Marital status: Single    Spouse name: Not on file  . Number of children: Not on file  . Years of education: Not on file  . Highest education level: Not on file  Occupational History  . Not on file  Social Needs  . Financial resource strain: Not on file  . Food insecurity:    Worry: Not on file    Inability: Not on file  . Transportation needs:    Medical: Not on file    Non-medical: Not on file  Tobacco Use  . Smoking status: Never Smoker  .  Smokeless tobacco: Never Used  Substance and Sexual Activity  . Alcohol use: Yes    Comment: 2 quarts a week   . Drug use: Yes    Frequency: 3.0 times per week    Types: Marijuana, Cocaine  . Sexual activity: Not on file  Lifestyle  . Physical activity:    Days per week: Not on file    Minutes per session: Not on file  . Stress: Not on file  Relationships  . Social connections:    Talks on phone: Not on file    Gets together: Not on file    Attends religious service: Not on file    Active member of club or organization: Not on file    Attends  meetings of clubs or organizations: Not on file    Relationship status: Not on file  . Intimate partner violence:    Fear of current or ex partner: Not on file    Emotionally abused: Not on file    Physically abused: Not on file    Forced sexual activity: Not on file  Other Topics Concern  . Not on file  Social History Narrative  . Not on file    Family History:   The patient's family history includes Hypertension in his maternal grandmother.    ROS:  Please see the history of present illness.  All other ROS reviewed and negative.     Physical Exam/Data:   Vitals:   02/28/19 1030 02/28/19 1344 02/28/19 1400 02/28/19 1537  BP: (!) 134/107 (!) 151/119 (!) 164/108 (!) 142/115  Pulse: 81 80 80 86  Resp: 12 12 14    Temp:    98.6 F (37 C)  TempSrc:    Oral  SpO2: 98% 97% 94% 98%  Weight:      Height:       No intake or output data in the 24 hours ending 02/28/19 1541 Last 3 Weights 02/28/2019 11/11/2018 11/10/2018  Weight (lbs) 230 lb 223 lb 12.8 oz 230 lb 9.6 oz  Weight (kg) 104.327 kg 101.515 kg 104.599 kg     Body mass index is 32.08 kg/m.  General:  Well nourished, well developed, in no acute distress HEENT: normal Lymph: no adenopathy Neck: + 4 cm JVD Endocrine:  No thryomegaly Vascular: No carotid bruits; FA pulses 2+ bilaterally without bruits  Cardiac:  normal S1, S2, positive S4; RRR; no murmur  Lungs: Crackles at both bases to auscultation bilaterally, no wheezing, rhonchi or rales  Abd: soft, nontender, no hepatomegaly  Ext: Bilateral 1+ pitting edema Musculoskeletal:  No deformities, BUE and BLE strength normal and equal Skin: warm and dry  Neuro:  CNs 2-12 intact, no focal abnormalities noted Psych:  Normal affect   EKG:  The ECG that was done and was personally reviewed and demonstrates sinus rhythm with LVH and negative T waves in the lateral leads.  Relevant CV Studies:  TTE: 09/27/2018 Left ventricle: The cavity size was mildly dilated. Wall    thickness was increased in a pattern of mild LVH. Systolic   function was severely reduced. The estimated ejection fraction   was in the range of 20% to 25%. Diffuse hypokinesis. Features are   consistent with a pseudonormal left ventricular filling pattern,   with concomitant abnormal relaxation and increased filling   pressure (grade 2 diastolic dysfunction). Doppler parameters are   consistent with high ventricular filling pressure. - Aortic valve: There was trivial regurgitation. - Mitral valve: There was mild regurgitation. - Left  atrium: The atrium was severely dilated. - Right ventricle: The cavity size was mildly dilated. Systolic   function was mildly reduced. - Right atrium: The atrium was moderately dilated. - Pericardium, extracardiac: A trivial pericardial effusion was   identified.  Impressions: - Severe global reduction in LV systolic function; mild LVH and   LVE; moderate diastolic dysfunction; trace AI; mild MR; biatrial   enlargement; mild RVE with mild RV dysfunction.  Laboratory Data:  Chemistry Recent Labs  Lab 02/28/19 1025  NA 134*  K 4.0  CL 103  CO2 23  GLUCOSE 103*  BUN 27*  CREATININE 1.45*  CALCIUM 8.1*  GFRNONAA 54*  GFRAA >60  ANIONGAP 8    Recent Labs  Lab 02/28/19 1025  PROT 5.9*  ALBUMIN 3.0*  AST 111*  ALT 100*  ALKPHOS 99  BILITOT 1.0   Hematology Recent Labs  Lab 02/28/19 1025  WBC 7.0  RBC 4.14*  HGB 11.6*  HCT 36.0*  MCV 87.0  MCH 28.0  MCHC 32.2  RDW 15.8*  PLT 315   Cardiac Enzymes Recent Labs  Lab 02/28/19 1025  TROPONINI 8.90*   No results for input(s): TROPIPOC in the last 168 hours.  BNP Recent Labs  Lab 02/28/19 0954  BNP 2,106.0*    DDimer No results for input(s): DDIMER in the last 168 hours.  Radiology/Studies:  Dg Chest 2 View  Result Date: 02/28/2019 CLINICAL DATA:  Shortness of breath.  Cough. EXAM: CHEST - 2 VIEW COMPARISON:  December 25, 2018 FINDINGS: Cardiomegaly. Hila, mediastinum,  lungs, and pleura are otherwise normal. IMPRESSION: Cardiomegaly.  No other abnormalities. Electronically Signed   By: Gerome Samavid  Williams III M.D   On: 02/28/2019 12:01   TTE: 09/27/2018  - Left ventricle: The cavity size was mildly dilated. Wall   thickness was increased in a pattern of mild LVH. Systolic   function was severely reduced. The estimated ejection fraction   was in the range of 20% to 25%. Diffuse hypokinesis. Features are   consistent with a pseudonormal left ventricular filling pattern,   with concomitant abnormal relaxation and increased filling   pressure (grade 2 diastolic dysfunction). Doppler parameters are   consistent with high ventricular filling pressure. - Aortic valve: There was trivial regurgitation. - Mitral valve: There was mild regurgitation. - Left atrium: The atrium was severely dilated. - Right ventricle: The cavity size was mildly dilated. Systolic   function was mildly reduced. - Right atrium: The atrium was moderately dilated. - Pericardium, extracardiac: A trivial pericardial effusion was   identified.  Impressions: - Severe global reduction in LV systolic function; mild LVH and   LVE; moderate diastolic dysfunction; trace AI; mild MR; biatrial   enlargement; mild RVE with mild RV dysfunction.    Assessment and Plan:   1. Acute on chronic systolic congestive heart failure-patient presents with CHF symptoms.   1. The patient states that he was using his medicines but it is unclear  2. We will start Lasix 80 mg IV twice daily  3. Discharge weight 101 kg, currently 104 kg  4. LV function severely reduced on previous echocardiogram.  Etiology unclear but felt potentially related to alcohol or hypertension.    However he now presents with significant troponin elevation, if stable creatinine we will plan for left and right cardiac catheterization.  2. Cardiomyopathy-etiology unclear. Previously felt secondary to cocaine abuse, alcohol use or  hypertension, given significantly elevated troponin we will plan for cath 3. Continue carvedilol to 6.25 mg twice  daily, losartan 50 mg daily, BiDil.    It was felt that  given social history he will be able to afford or comply with Entresto. 4. Elevated troponin 8.9, the patient was started on heparin will continue trending troponin we will plan for right and left cardiac cath if creatinine stable or improved.  It is questionable if the patient will benefit from cath/potential intervention unless critical left main lesion or lesion in the proximal vessel because of known history of noncompliance.   5. Elevated LFTs -possibly secondary to CHF with right-sided failure, will follow 6. Chronic stage III kidney disease-follow renal function closely with diuresis and resumption of losartan. 7. Hypertension-blood pressure elevated.  Medications as outlined above.  We will restart his home medications as we do not know if he was taking them or not. 8. Cocaine abuse-patient counseled on avoiding.   Severity of Illness: The appropriate patient status for this patient is INPATIENT. Inpatient status is judged to be reasonable and necessary in order to provide the required intensity of service to ensure the patient's safety. The patient's presenting symptoms, physical exam findings, and initial radiographic and laboratory data in the context of their chronic comorbidities is felt to place them at high risk for further clinical deterioration. Furthermore, it is not anticipated that the patient will be medically stable for discharge from the hospital within 2 midnights of admission. The following factors support the patient status of inpatient.   " The patient's presenting symptoms include dyspnea on exertion, chest pain. " The worrisome physical exam findings include fluid overload. " The initial radiographic and laboratory data are worrisome because of chest x-ray showing CHF, troponin of 8.9 BNP over 2000. " The  chronic co-morbidities include alcohol and cocaine use, stage III kidney disease, hypertension.  * I certify that at the point of admission it is my clinical judgment that the patient will require inpatient hospital care spanning beyond 2 midnights from the point of admission due to high intensity of service, high risk for further deterioration and high frequency of surveillance required.*   For questions or updates, please contact [facility] Please consult [email or web address] for contact info under   Signed, Tobias Alexander, MD  02/28/2019 3:41 PM
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
## 2019-02-28 NOTE — ED Triage Notes (Signed)
Patient presented to ed with c/o shortness of breath for two days. Patient state he been out of his lasix for 3 days. Patient legs are swollen than normal and has chest pain.

## 2019-02-28 NOTE — Progress Notes (Signed)
ANTICOAGULATION CONSULT NOTE  Pharmacy Consult for heparin  Indication: chest pain/ACS  Allergies  Allergen Reactions  . Hydrocodone Itching    Patient Measurements: Height: 5\' 11"  (180.3 cm) Weight: 245 lb 9.6 oz (111.4 kg) IBW/kg (Calculated) : 75.3 Heparin Dosing Weight: 97.2 kg  Vital Signs: Temp: 97.9 F (36.6 C) (04/26 1950) Temp Source: Oral (04/26 1950) BP: 114/73 (04/26 1950) Pulse Rate: 86 (04/26 1950)  Labs: Recent Labs    02/28/19 1025 02/28/19 1647 02/28/19 2039  HGB 11.6*  --   --   HCT 36.0*  --   --   PLT 315  --   --   HEPARINUNFRC  --   --  <0.10*  CREATININE 1.45*  --   --   TROPONINI 8.90* 7.04*  --     Estimated Creatinine Clearance: 73 mL/min (A) (by C-G formula based on SCr of 1.45 mg/dL (H)).   Medical History: Past Medical History:  Diagnosis Date  . Alcohol abuse   . Chronic combined systolic and diastolic CHF (congestive heart failure) (HCC)    a. 09/27/18 showed mild LVH, EF 20-25%, grade 2 DD, mild MR, severely dilated LA, mildly dilated RV with mildly reduced RV function, mod RAE.  Marland Kitchen Cocaine abuse (HCC)   . Hypertension     Assessment: 58 yo man with positive troponin to start heparin per pharmacy.  No anticoagulants PTA.  TBW 104.3 kg, HDW 97.2 kg. SCr 1.45. Hg 11.6 PLTC 325.   No bleeding reported.   Initial heparin level came back undetectable on 1250 units/hr. Heparin was running in R AC but now moved to L forearm. No s/sx of bleeding.   Goal of Therapy:  Heparin level 0.3-0.7 units/ml Monitor platelets by anticoagulation protocol: Yes   Plan:  Increase heparin infusion to 1500 units/hr Check anti-Xa level daily while on heparin Continue to monitor H&H and platelets  Sherron Monday, PharmD, BCCCP Clinical Pharmacist  Pager: [phone] Phone: [phone] 02/28/2019 9:09 PM

## 2019-02-28 NOTE — ED Notes (Signed)
Bed: IE33 Expected date: 02/28/19 Expected time: 9:16 AM Means of arrival: Ambulance Comments: CHF [facility]

## 2019-03-01 ENCOUNTER — Encounter (HOSPITAL_COMMUNITY): Payer: Self-pay

## 2019-03-01 DIAGNOSIS — F329 Major depressive disorder, single episode, unspecified: Secondary | ICD-10-CM

## 2019-03-01 DIAGNOSIS — I5023 Acute on chronic systolic (congestive) heart failure: Secondary | ICD-10-CM

## 2019-03-01 DIAGNOSIS — F32A Depression, unspecified: Secondary | ICD-10-CM

## 2019-03-01 DIAGNOSIS — F419 Anxiety disorder, unspecified: Secondary | ICD-10-CM

## 2019-03-01 LAB — COMPREHENSIVE METABOLIC PANEL
ALT: 100 U/L — ABNORMAL HIGH (ref 0–44)
AST: 78 U/L — ABNORMAL HIGH (ref 15–41)
Albumin: 2.6 g/dL — ABNORMAL LOW (ref 3.5–5.0)
Alkaline Phosphatase: 80 U/L (ref 38–126)
Anion gap: 10 (ref 5–15)
BUN: 27 mg/dL — ABNORMAL HIGH (ref 6–20)
CO2: 26 mmol/L (ref 22–32)
Calcium: 7.9 mg/dL — ABNORMAL LOW (ref 8.9–10.3)
Chloride: 105 mmol/L (ref 98–111)
Creatinine, Ser: 1.65 mg/dL — ABNORMAL HIGH (ref 0.61–1.24)
GFR calc Af Amer: 53 mL/min — ABNORMAL LOW (ref >60)
GFR calc non Af Amer: 46 mL/min — ABNORMAL LOW (ref >60)
Glucose, Bld: 100 mg/dL — ABNORMAL HIGH (ref 70–99)
Potassium: 3.6 mmol/L (ref 3.5–5.1)
Sodium: 141 mmol/L (ref 135–145)
Total Bilirubin: 0.8 mg/dL (ref 0.3–1.2)
Total Protein: 5.4 g/dL — ABNORMAL LOW (ref 6.5–8.1)

## 2019-03-01 LAB — CBC
HCT: 32.8 % — ABNORMAL LOW (ref 39.0–52.0)
Hemoglobin: 11.1 g/dL — ABNORMAL LOW (ref 13.0–17.0)
MCH: 28.1 pg (ref 26.0–34.0)
MCHC: 33.8 g/dL (ref 30.0–36.0)
MCV: 83 fL (ref 80.0–100.0)
Platelets: 310 10*3/uL (ref 150–400)
RBC: 3.95 MIL/uL — ABNORMAL LOW (ref 4.22–5.81)
RDW: 15.2 % (ref 11.5–15.5)
WBC: 6 10*3/uL (ref 4.0–10.5)
nRBC: 0 % (ref 0.0–0.2)

## 2019-03-01 LAB — TROPONIN I: Troponin I: 5.85 ng/mL (ref <0.03)

## 2019-03-01 LAB — HEPARIN LEVEL (UNFRACTIONATED)
Heparin Unfractionated: 0.1 IU/mL — ABNORMAL LOW (ref 0.30–0.70)
Heparin Unfractionated: <0.1 IU/mL — ABNORMAL LOW (ref 0.30–0.70)

## 2019-03-01 MED ORDER — FUROSEMIDE 10 MG/ML IJ SOLN
80.0000 mg | Freq: Two times a day (BID) | INTRAMUSCULAR | Status: DC
  Administered 2019-03-02: 80 mg via INTRAVENOUS
  Filled 2019-03-01: qty 8

## 2019-03-01 MED ORDER — ENOXAPARIN SODIUM 40 MG/0.4ML ~~LOC~~ SOLN
40.0000 mg | Freq: Every day | SUBCUTANEOUS | Status: DC
  Filled 2019-03-01: qty 0.4

## 2019-03-01 MED ORDER — PRAZOSIN HCL 1 MG PO CAPS
1.0000 mg | ORAL_CAPSULE | Freq: Every day | ORAL | Status: DC
  Administered 2019-03-01 – 2019-03-02 (×2): 1 mg via ORAL
  Filled 2019-03-01 (×3): qty 1

## 2019-03-01 MED ORDER — FUROSEMIDE 10 MG/ML IJ SOLN
80.0000 mg | Freq: Once | INTRAMUSCULAR | Status: AC
  Administered 2019-03-01: 80 mg via INTRAVENOUS
  Filled 2019-03-01: qty 8

## 2019-03-01 MED ORDER — CLOPIDOGREL BISULFATE 75 MG PO TABS
300.0000 mg | ORAL_TABLET | Freq: Once | ORAL | Status: AC
  Administered 2019-03-01: 300 mg via ORAL
  Filled 2019-03-01: qty 4

## 2019-03-01 MED ORDER — ESCITALOPRAM OXALATE 10 MG PO TABS
10.0000 mg | ORAL_TABLET | Freq: Every day | ORAL | Status: DC
  Administered 2019-03-01 – 2019-03-02 (×2): 10 mg via ORAL
  Filled 2019-03-01 (×2): qty 1

## 2019-03-01 MED ORDER — ASPIRIN EC 81 MG PO TBEC
81.0000 mg | DELAYED_RELEASE_TABLET | Freq: Every day | ORAL | Status: DC
  Administered 2019-03-01 – 2019-03-03 (×3): 81 mg via ORAL
  Filled 2019-03-01 (×3): qty 1

## 2019-03-01 MED ORDER — CLOPIDOGREL BISULFATE 75 MG PO TABS
75.0000 mg | ORAL_TABLET | Freq: Every day | ORAL | Status: DC
  Administered 2019-03-02 – 2019-03-03 (×2): 75 mg via ORAL
  Filled 2019-03-01 (×2): qty 1

## 2019-03-01 NOTE — Progress Notes (Signed)
Pt now stating he isn't leaving AMA - stated he just didn't want to have the heart cath procedure. Pt stated he is willing to stay for medication adjustments. Dr. Excell Seltzer text paged and made aware, stated to hold off on reinserting IV for now. Pt allowed to eat - Dr. Excell Seltzer will be back up later on to re-eval patient. Pt updated on plan.

## 2019-03-01 NOTE — Progress Notes (Signed)
ANTICOAGULATION CONSULT NOTE  Pharmacy Consult for heparin  Indication: chest pain/ACS  Allergies  Allergen Reactions  . Hydrocodone Itching    Patient Measurements: Height: 5\' 11"  (180.3 cm) Weight: 243 lb 12.8 oz (110.6 kg) IBW/kg (Calculated) : 75.3 Heparin Dosing Weight: 97.2 kg  Vital Signs: Temp: 97.8 F (36.6 C) (04/27 0512) Temp Source: Oral (04/27 0512) BP: 137/110 (04/27 0512) Pulse Rate: 83 (04/27 0512)  Labs: Recent Labs    02/28/19 1025 02/28/19 1647 02/28/19 2039 03/01/19 0534  HGB 11.6*  --   --  11.1*  HCT 36.0*  --   --  32.8*  PLT 315  --   --  310  HEPARINUNFRC  --   --  <0.10* 0.10*  CREATININE 1.45*  --   --   --   TROPONINI 8.90* 7.04* 5.36*  --     Estimated Creatinine Clearance: 72.8 mL/min (A) (by C-G formula based on SCr of 1.45 mg/dL (H)).   Medical History: Past Medical History:  Diagnosis Date  . Alcohol abuse   . Chronic combined systolic and diastolic CHF (congestive heart failure) (HCC)    a. 09/27/18 showed mild LVH, EF 20-25%, grade 2 DD, mild MR, severely dilated LA, mildly dilated RV with mildly reduced RV function, mod RAE.  Marland Kitchen Cocaine abuse (HCC)   . Hypertension     Assessment: 58 yo man with positive troponin to start heparin per pharmacy.  No anticoagulants PTA.   TBW 104.3 kg, HDW 97.2 kg. CBC relatively stable from yesterday.  Heparin level sub-therapeutic at 0.1. No infusion issues per RN. No bleeding reported.   Goal of Therapy:  Heparin level 0.3-0.7 units/ml Monitor platelets by anticoagulation protocol: Yes   Plan:  Increase heparin infusion to 1700 units/hr Check 6 hour heparin level Check heparin level daily while on heparin Continue to monitor H&H and platelets  Danae Orleans, PharmD PGY1 Pharmacy Resident Phone [phone] 03/01/2019       6:09 AM

## 2019-03-01 NOTE — TOC Progression Note (Signed)
Transition of Care [facility]) - Progression Note    Patient Details  Name: Dale [name] MRN: [id number] Date of Birth: 1961/03/11  [facility] CM/SW Contact  [name], [name], RN Phone Number: 03/01/2019, 3:05 PM  Clinical Narrative:  Pt is without PCP- hospital follow up appointment scheduled at the [facility]. Information placed on AVS. Patient stated that he is homeless and has been living where he can. CM did provide patient with list of homeless shelters in the community. CM did ask MD to send all Rxs to the [facility] pharmacy so medications can be filled and delivered to the bedside. No further needs from CM at this time.       Expected Discharge Plan: Home/Self Care Barriers to Discharge: No Barriers Identified  Expected Discharge Plan and Services Expected Discharge Plan: Home/Self Care   Discharge Planning Services: Follow-up appt scheduled, Indigent Health Clinic Post Acute Care Choice: NA Living arrangements for the past 2 months: Apartment                 DME Arranged: N/A   Social Determinants of Health (SDOH) Interventions    Readmission Risk Interventions No flowsheet data found.
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
## 2019-03-01 NOTE — Progress Notes (Addendum)
Progress Note  Patient Name: Dale [name] Date of Encounter: 03/01/2019  Primary Cardiologist: Kristeen Miss, MD   Subjective   Patient angry.  Will not answer medical questions.  Tells me "this is all bullshit."  Demanding to leave immediately.  Inpatient Medications    Scheduled Meds:  carvedilol  6.25 mg Oral BID WC   furosemide  80 mg Intravenous BID   isosorbide-hydrALAZINE  1 tablet Oral TID   losartan  50 mg Oral Daily   Continuous Infusions:  heparin 1,700 Units/hr (03/01/19 0610)   PRN Meds:    Vital Signs    Vitals:   02/28/19 1537 02/28/19 1545 02/28/19 1950 03/01/19 0512  BP: (!) 142/115  114/73 (!) 137/110  Pulse: 86  86 83  Resp:   17 20  Temp: 98.6 F (37 C)  97.9 F (36.6 C) 97.8 F (36.6 C)  TempSrc: Oral  Oral Oral  SpO2: 98%  100% 98%  Weight:  111.4 kg  110.6 kg  Height:        Intake/Output Summary (Last 24 hours) at 03/01/2019 0815 Last data filed at 03/01/2019 0600 Gross per 24 hour  Intake 503.21 ml  Output 1575 ml  Net -1071.79 ml   Last 3 Weights 03/01/2019 02/28/2019 02/28/2019  Weight (lbs) 243 lb 12.8 oz 245 lb 9.6 oz 230 lb  Weight (kg) 110.587 kg 111.403 kg 104.327 kg      Telemetry    Normal sinus rhythm- Personally Reviewed  Physical Exam  Alert, oriented male, sitting up at the bedside.  He is angry this morning demanding to leave the hospital. The patient did not allow me to examine him  Labs    Chemistry Recent Labs  Lab 02/28/19 1025 03/01/19 0534  NA 134* 141  K 4.0 3.6  CL 103 105  CO2 23 26  GLUCOSE 103* 100*  BUN 27* 27*  CREATININE 1.45* 1.65*  CALCIUM 8.1* 7.9*  PROT 5.9* 5.4*  ALBUMIN 3.0* 2.6*  AST 111* 78*  ALT 100* 100*  ALKPHOS 99 80  BILITOT 1.0 0.8  GFRNONAA 54* 46*  GFRAA >60 53*  ANIONGAP 8 10     Hematology Recent Labs  Lab 02/28/19 1025 03/01/19 0534  WBC 7.0 6.0  RBC 4.14* 3.95*  HGB 11.6* 11.1*  HCT 36.0* 32.8*  MCV 87.0 83.0  MCH 28.0 28.1  MCHC 32.2 33.8    RDW 15.8* 15.2  PLT 315 310    Cardiac Enzymes Recent Labs  Lab 02/28/19 1025 02/28/19 1647 02/28/19 2039 03/01/19 0534  TROPONINI 8.90* 7.04* 5.36* 5.85*   No results for input(s): TROPIPOC in the last 168 hours.   BNP Recent Labs  Lab 02/28/19 0954  BNP 2,106.0*     DDimer No results for input(s): DDIMER in the last 168 hours.   Radiology    Dg Chest 2 View  Result Date: 02/28/2019 CLINICAL DATA:  Shortness of breath.  Cough. EXAM: CHEST - 2 VIEW COMPARISON:  December 25, 2018 FINDINGS: Cardiomegaly. Hila, mediastinum, lungs, and pleura are otherwise normal. IMPRESSION: Cardiomegaly.  No other abnormalities. Electronically Signed   By: Gerome Sam III M.D   On: 02/28/2019 12:01    Cardiac Studies   Echo 09/2018 Study Conclusions  - Left ventricle: The cavity size was mildly dilated. Wall   thickness was increased in a pattern of mild LVH. Systolic   function was severely reduced. The estimated ejection fraction   was in the range of 20% to 25%. Diffuse hypokinesis.  Features are   consistent with a pseudonormal left ventricular filling pattern,   with concomitant abnormal relaxation and increased filling   pressure (grade 2 diastolic dysfunction). Doppler parameters are   consistent with high ventricular filling pressure. - Aortic valve: There was trivial regurgitation. - Mitral valve: There was mild regurgitation. - Left atrium: The atrium was severely dilated. - Right ventricle: The cavity size was mildly dilated. Systolic   function was mildly reduced. - Right atrium: The atrium was moderately dilated. - Pericardium, extracardiac: A trivial pericardial effusion was   identified.  Impressions:  - Severe global reduction in LV systolic function; mild LVH and   LVE; moderate diastolic dysfunction; trace AI; mild MR; biatrial   enlargement; mild RVE with mild RV dysfunction.  Patient Profile     58 y.o. male with hx of uncontrolled HTN, and  polysubstance abuse including cocaine and alcohol, chronic systolic CHF, homelessness and non compliant presented with progressive worsening dyspnea x 2 weeks and chest pain x 1 day who found to have elevated troponin in setting of positive cocaine.   Assessment & Plan    1. Acute on chronic systolic CHF - BNP > 2000. Diuresed 1L so far. Etiology of low LVEF previously felt potentially related to alcohol and/or HTN.  -However significantly elevated troponin this admission. Will need ischemic evaluation however at risk for contrast induced cardiomyopathy. Continue diuresis. Scr 1.45>>1.65 today. Consider holding Losartan. Continue Coreg and Bidil.   2. Acute on CKD III - Baseline creatinine of 1.3-1.5. Scr 1.45>>1.65 today. Consider holding Losartan. Timing of cath per MD.  3. NSTEMI - Troponin trending down 8.9>>7.04>>5.36. Continue IV heparin. Plan for right and left cardiac cath if creatinine stable or improved. ? Diagnostic given non compliance.  - Continue IV heparin. Add ASA 81mg  daily. ADD statin once LFTs improves.  09/27/2018: Cholesterol 161; HDL 55; LDL Cholesterol 101; Triglycerides 25; VLDL 5  4. Elevated LFTS - possibly due to alcohol or CHF, Continue to follow   5. HTN - BP stable on current medication. IF hold ARB>> follow closely  5. Polysubstance abuse - Cocaine + this admission. Advised cessation   For questions or updates, please contact [facility] Please consult [email or web address] for contact info under     SignedManson Passey, PA  03/01/2019, 8:15 AM    The patient is independently evaluated this morning.  I had planned on continuing his medical therapies and delaying cardiac catheterization until tomorrow because his creatinine has increased with IV diuresis.  However, when I entered the patient's room he demanded to leave the hospital immediately.  I attempted to at least talk to him about his cardiac status but he refused to have any conversation.  He  leaves AGAINST MEDICAL ADVICE and refuses to sign the paperwork.  Tonny Bollman 03/01/2019 10:16 AM  ADDENDUM: See nursing notes.  The patient has changed his mind and has decided to stay in the hospital.  He apologized for his behavior earlier this morning.  On my evaluation this afternoon, he is alert, oriented, in no distress.  HEENT is normal.  JVP is moderately elevated.  Lung fields are clear bilaterally.  Heart is regular rate and rhythm with no murmur or gallop.  The apex is nonpalpable.  Abdomen is soft, obese, nontender.  Back is midline with no flank tenderness.  Extremities have 1+ ankle edema bilaterally.  Skin is warm and dry without rash.  I have reviewed all available data.  The patient has a known cardiomyopathy  with LVEF 20 to 25%.  He presents now with acute on chronic systolic heart failure as well as elevated troponin consistent with non-ST elevation infarction.  He does not wish to undergo cardiac catheterization under any circumstances.  I agreed to treat him medically per his wishes.  I think we should continue to treat his congestive heart failure with IV diuresis.  I am going to hold losartan in the setting of mild acute kidney injury on a background of CKD stage III.  We will continue carvedilol, Isosorbide, and hydralazine at current doses.  Will involve the case manager to help the patient obtain outpatient medications and also to help with his living situation as he is homeless.  We will continue IV heparin today and treat medically for non-STEMI with dual antiplatelet therapy using aspirin and clopidogrel.  I will plan to stop his heparin tomorrow morning.  Tonny Bollman 03/01/2019 12:38 PM

## 2019-03-01 NOTE — Progress Notes (Signed)
Pt requesting to leave AMA - Dr. Excell Seltzer in to see patient, per MD patient "cussed him out, stating we're all lying to him." IVs removed, IV heparin discontinued - tele box removed. Pt refused to sign AMA paper.

## 2019-03-02 LAB — COMPREHENSIVE METABOLIC PANEL
ALT: 79 U/L — ABNORMAL HIGH (ref 0–44)
AST: 41 U/L (ref 15–41)
Albumin: 2.5 g/dL — ABNORMAL LOW (ref 3.5–5.0)
Alkaline Phosphatase: 78 U/L (ref 38–126)
Anion gap: 9 (ref 5–15)
BUN: 19 mg/dL (ref 6–20)
CO2: 25 mmol/L (ref 22–32)
Calcium: 8 mg/dL — ABNORMAL LOW (ref 8.9–10.3)
Chloride: 106 mmol/L (ref 98–111)
Creatinine, Ser: 1.32 mg/dL — ABNORMAL HIGH (ref 0.61–1.24)
GFR calc Af Amer: >60 mL/min (ref >60)
GFR calc non Af Amer: >60 mL/min (ref >60)
Glucose, Bld: 99 mg/dL (ref 70–99)
Potassium: 3.6 mmol/L (ref 3.5–5.1)
Sodium: 140 mmol/L (ref 135–145)
Total Bilirubin: 0.6 mg/dL (ref 0.3–1.2)
Total Protein: 5.1 g/dL — ABNORMAL LOW (ref 6.5–8.1)

## 2019-03-02 MED ORDER — LOSARTAN POTASSIUM 25 MG PO TABS
25.0000 mg | ORAL_TABLET | Freq: Every day | ORAL | Status: DC
  Administered 2019-03-02 – 2019-03-03 (×2): 25 mg via ORAL
  Filled 2019-03-02 (×2): qty 1

## 2019-03-02 MED ORDER — FUROSEMIDE 40 MG PO TABS
40.0000 mg | ORAL_TABLET | Freq: Two times a day (BID) | ORAL | Status: DC
  Administered 2019-03-02 – 2019-03-03 (×2): 40 mg via ORAL
  Filled 2019-03-02 (×2): qty 1

## 2019-03-02 NOTE — Progress Notes (Signed)
Progress Note  Patient Name: Dale [name] Date of Encounter: 03/02/2019  Primary Cardiologist: Kristeen Miss, MD   Subjective   Feeling better today.  Still some shortness of breath last night while he was lying down to sleep.  No chest pain.  Inpatient Medications    Scheduled Meds: . aspirin EC  81 mg Oral Daily  . carvedilol  6.25 mg Oral BID WC  . clopidogrel  75 mg Oral Daily  . enoxaparin (LOVENOX) injection  40 mg Subcutaneous q1800  . escitalopram  10 mg Oral Daily  . furosemide  80 mg Intravenous BID  . isosorbide-hydrALAZINE  1 tablet Oral TID  . prazosin  1 mg Oral QHS   Continuous Infusions:  PRN Meds:    Vital Signs    Vitals:   03/01/19 0512 03/01/19 1500 03/01/19 2045 03/02/19 0608  BP: (!) 137/110 130/77 127/80 127/82  Pulse: 83 80 79 79  Resp: 20  20   Temp: 97.8 F (36.6 C)  97.7 F (36.5 C) 98.5 F (36.9 C)  TempSrc: Oral  Oral Oral  SpO2: 98% 98% 98% 99%  Weight: 110.6 kg   106.8 kg  Height:        Intake/Output Summary (Last 24 hours) at 03/02/2019 0920 Last data filed at 03/02/2019 0700 Gross per 24 hour  Intake 980 ml  Output 2750 ml  Net -1770 ml   Last 3 Weights 03/02/2019 03/01/2019 02/28/2019  Weight (lbs) 235 lb 8 oz 243 lb 12.8 oz 245 lb 9.6 oz  Weight (kg) 106.822 kg 110.587 kg 111.403 kg      Telemetry    Sinus rhythm without significant arrhythmia, few PVCs noted - Personally Reviewed   Physical Exam  Alert, oriented male in no distress GEN: No acute distress.   Neck: No JVD Cardiac: RRR, no murmurs, rubs, or gallops.  Respiratory: Clear to auscultation bilaterally. GI: Soft, nontender, non-distended  MS:  1+ bilateral ankle edema; No deformity. Neuro:  Nonfocal  Psych: Normal affect   Labs    Chemistry Recent Labs  Lab 02/28/19 1025 03/01/19 0534 03/02/19 0351  NA 134* 141 140  K 4.0 3.6 3.6  CL 103 105 106  CO2 23 26 25   GLUCOSE 103* 100* 99  BUN 27* 27* 19  CREATININE 1.45* 1.65* 1.32*  CALCIUM  8.1* 7.9* 8.0*  PROT 5.9* 5.4* 5.1*  ALBUMIN 3.0* 2.6* 2.5*  AST 111* 78* 41  ALT 100* 100* 79*  ALKPHOS 99 80 78  BILITOT 1.0 0.8 0.6  GFRNONAA 54* 46* >60  GFRAA >60 53* >60  ANIONGAP 8 10 9      Hematology Recent Labs  Lab 02/28/19 1025 03/01/19 0534  WBC 7.0 6.0  RBC 4.14* 3.95*  HGB 11.6* 11.1*  HCT 36.0* 32.8*  MCV 87.0 83.0  MCH 28.0 28.1  MCHC 32.2 33.8  RDW 15.8* 15.2  PLT 315 310    Cardiac Enzymes Recent Labs  Lab 02/28/19 1025 02/28/19 1647 02/28/19 2039 03/01/19 0534  TROPONINI 8.90* 7.04* 5.36* 5.85*   No results for input(s): TROPIPOC in the last 168 hours.   BNP Recent Labs  Lab 02/28/19 0954  BNP 2,106.0*     DDimer No results for input(s): DDIMER in the last 168 hours.   Radiology    Dg Chest 2 View  Result Date: 02/28/2019 CLINICAL DATA:  Shortness of breath.  Cough. EXAM: CHEST - 2 VIEW COMPARISON:  December 25, 2018 FINDINGS: Cardiomegaly. Hila, mediastinum, lungs, and pleura are otherwise normal.  IMPRESSION: Cardiomegaly.  No other abnormalities. Electronically Signed   By: Gerome Sam III M.D   On: 02/28/2019 12:01    Cardiac Studies   N/a   Patient Profile     58 y.o. male with hx of uncontrolled HTN, and polysubstance abuse including cocaine and alcohol,chronic systolicCHF, homelessness and non compliant presented with progressive worsening dyspnea x 2 weeks and chest pain x 1 day who found to have elevated troponin in setting of positive cocaine.   Assessment & Plan    1. Acute on chronic systolic CHF: - BNP > 2000. Net - 3L. Scr 1.45>>1.65>>1.32 today.  Weight trending down 243>>235. Tolerating Coreg and Bidil. ARB held in the setting of AKI and diuresis  2. Acute on CKD III: - Baseline creatinine of 1.3-1.5. Scr 1.45>>1.65>>1.32 today. -- follow BMET  3. NSTEMI: - Troponin trending down 8.9>>7.04>>5.36. Planned to continue IV heparin but patient removed IV. Option of cath discussed with patient via Dr. Excell Seltzer  yesterday and patient declined. Wants medical therapy only.  - Now on ASA 81mg  daily, plavix 75mg  (load 300mg  yesterday), and coreg 6.25mg  BID. Add statin once LFTs improves.   4. Elevated LFTS: - possibly due to alcohol or CHF, improving  5. HTN: - BP stable on current medication. ARB held.  5. Polysubstance abuse: - Cocaine + this admission. Advised cessation   6. PTSD/depression/anxiety: Seen by psychiatry yesterday with Lexapro, and prazosin started.  -- recheck EKG this morning with addition of new medications.  For questions or updates, please contact [facility] Please consult [email or web address] for contact info under   Signed, Laverda Page, NP  03/02/2019, 9:20 AM    Patient seen, examined. Available data reviewed. Agree with findings, assessment, and plan as outlined by Laverda Page, NP.  The physical exam findings outlined above reflect the personal findings of my examination today.  The patient continues to improve clinically with respect to his acute on chronic systolic heart failure.  He has now been counseled extensively about the strict importance of cocaine cessation.  He understands and is going to do his best to do better with this.  He has received IV Lasix today.  I am going to convert him to oral Lasix moving forward.  We will start him back on a low-dose ARB as his creatinine has improved.  He will continue on hydralazine nitrate therapy and carvedilol.  I would anticipate hospital discharge tomorrow.  We will work with the transition of care pharmacy to get his medications in order.  Appreciate Child psychotherapist and case Environmental consultant.  Tonny Bollman, M.D. 03/02/2019 9:52 AM

## 2019-03-02 NOTE — Plan of Care (Signed)
  Problem: Clinical Measurements: Goal: Ability to maintain clinical measurements within normal limits will improve Outcome: Progressing Goal: Will remain free from infection Outcome: Progressing Goal: Diagnostic test results will improve Outcome: Progressing Goal: Cardiovascular complication will be avoided Outcome: Progressing   Problem: Education: Goal: Understanding of CV disease, CV risk reduction, and recovery process will improve Outcome: Progressing   Problem: Clinical Measurements: Goal: Respiratory complications will improve Outcome: Completed/Met   Problem: Activity: Goal: Ability to return to baseline activity level will improve Outcome: Completed/Met

## 2019-03-03 DIAGNOSIS — E785 Hyperlipidemia, unspecified: Secondary | ICD-10-CM

## 2019-03-03 DIAGNOSIS — I4729 Other ventricular tachycardia: Secondary | ICD-10-CM

## 2019-03-03 DIAGNOSIS — I472 Ventricular tachycardia: Secondary | ICD-10-CM

## 2019-03-03 LAB — COMPREHENSIVE METABOLIC PANEL
ALT: 64 U/L — ABNORMAL HIGH (ref 0–44)
AST: 24 U/L (ref 15–41)
Albumin: 2.6 g/dL — ABNORMAL LOW (ref 3.5–5.0)
Alkaline Phosphatase: 77 U/L (ref 38–126)
Anion gap: 8 (ref 5–15)
BUN: 16 mg/dL (ref 6–20)
CO2: 25 mmol/L (ref 22–32)
Calcium: 8.4 mg/dL — ABNORMAL LOW (ref 8.9–10.3)
Chloride: 105 mmol/L (ref 98–111)
Creatinine, Ser: 1.42 mg/dL — ABNORMAL HIGH (ref 0.61–1.24)
GFR calc Af Amer: >60 mL/min (ref >60)
GFR calc non Af Amer: 55 mL/min — ABNORMAL LOW (ref >60)
Glucose, Bld: 95 mg/dL (ref 70–99)
Potassium: 3.8 mmol/L (ref 3.5–5.1)
Sodium: 138 mmol/L (ref 135–145)
Total Bilirubin: 0.5 mg/dL (ref 0.3–1.2)
Total Protein: 5.5 g/dL — ABNORMAL LOW (ref 6.5–8.1)

## 2019-03-03 MED ORDER — ASPIRIN 81 MG PO TBEC: 81.0000 mg | DELAYED_RELEASE_TABLET | Freq: Every day | ORAL | 3 refills | Status: DC

## 2019-03-03 MED ORDER — CARVEDILOL 12.5 MG PO TABS: 12.5000 mg | ORAL_TABLET | Freq: Two times a day (BID) | ORAL | 3 refills | Status: DC

## 2019-03-03 MED ORDER — LOSARTAN POTASSIUM 25 MG PO TABS: 25.0000 mg | ORAL_TABLET | Freq: Every day | ORAL | 4 refills | Status: DC

## 2019-03-03 MED ORDER — SPIRONOLACTONE 12.5 MG HALF TABLET
12.5000 mg | ORAL_TABLET | Freq: Every day | ORAL | Status: DC
  Administered 2019-03-03: 12.5 mg via ORAL
  Filled 2019-03-03: qty 1

## 2019-03-03 MED ORDER — CLOPIDOGREL BISULFATE 75 MG PO TABS: 75.0000 mg | ORAL_TABLET | Freq: Every day | ORAL | 3 refills | Status: DC

## 2019-03-03 MED ORDER — CARVEDILOL 12.5 MG PO TABS
12.5000 mg | ORAL_TABLET | Freq: Two times a day (BID) | ORAL | Status: DC
  Filled 2019-03-03: qty 1

## 2019-03-03 MED ORDER — FUROSEMIDE 40 MG PO TABS: 40.0000 mg | ORAL_TABLET | Freq: Two times a day (BID) | ORAL | 2 refills | Status: DC

## 2019-03-03 MED ORDER — SPIRONOLACTONE 25 MG PO TABS: 12.5000 mg | ORAL_TABLET | Freq: Every day | ORAL | 3 refills | Status: DC

## 2019-03-03 MED ORDER — PRAZOSIN HCL 1 MG PO CAPS: 1.0000 mg | ORAL_CAPSULE | Freq: Every day | ORAL | 3 refills | Status: DC

## 2019-03-03 MED ORDER — ATORVASTATIN CALCIUM 80 MG PO TABS: 80.0000 mg | ORAL_TABLET | Freq: Every day | ORAL | 3 refills | Status: DC

## 2019-03-03 MED FILL — SPIRONOLACTONE 25 MG TABLET: 25 | 15 days supply | Qty: 30 | Fill #0

## 2019-03-03 MED FILL — ASPIRIN LOW DOSE 81 MG TBEC: 81 | 30 days supply | Qty: 30 | Fill #0

## 2019-03-03 MED FILL — CARVEDILOL 12.5 MG TABLET: 12.5 | 30 days supply | Qty: 60 | Fill #0

## 2019-03-03 MED FILL — ATORVASTATIN CALCIUM 80 MG: 80 | 30 days supply | Qty: 30 | Fill #0

## 2019-03-03 MED FILL — LOSARTAN POTASSIUM 25 MG TA: 25 | 30 days supply | Qty: 30 | Fill #0

## 2019-03-03 MED FILL — PRAZOSIN HCL 1 MG CAPS: 1 | 30 days supply | Qty: 30 | Fill #0

## 2019-03-03 MED FILL — FUROSEMIDE 40 MG TABLET: 40 | 30 days supply | Qty: 60 | Fill #0

## 2019-03-03 MED FILL — CLOPIDOGREL 75 MG TABLET: 75 | 30 days supply | Qty: 30 | Fill #0

## 2019-03-03 NOTE — Discharge Summary (Addendum)
Discharge Summary    Patient ID: Dale [name],  MRN: [id number], DOB/AGE: 03-27-1961 58 y.o.  Admit date: 02/28/2019 Discharge date: 03/03/2019  Primary Care Provider: [name] Primary Cardiologist: Dr Elease Hashimoto Discharge Diagnoses    Principal Problem:   NSTEMI (non-ST elevated myocardial infarction) [facility]) Active Problems:   Essential hypertension   Acute systolic CHF (congestive heart failure) (HCC)   Cocaine abuse (HCC)   Chronic kidney disease, stage II (mild)   Anxiety and depression   Hyperlipidemia   NSVT (nonsustained ventricular tachycardia) (HCC)   Allergies Allergies  Allergen Reactions  . Hydrocodone Itching    Diagnostic Studies/Procedures    N/a  _____________   History of Present Illness     [name] with h/o uncontrolled hypertension and polysubstance abuse including cocaine and alcohol, who has been followed by Dr Elease Hashimoto for chronic systolic CHF. He was admitted in January 2020 with acute on chronic combined systolic diastolic CHF. Found to be hypertensive and with associated pulmonary edema.He was diuresed.Had not been on any hypertensive regimen at home due to lack of insurance and no PCP. He had continued to use cocaine prior to the admission. Lots of heavy alcohol use. Echo with EF of 25% with grade 2 DD. Left AMA. Homeless, lived in a long-term hotel settings, noncompliant with diet, he stated he would eat mostly [employer].  He presented with progressively worsening dyspnea on exertion now at rest, two-pillow orthopnea no proximal nocturnal dyspnea and significant lower extremity edema.  This had progressed over last 2 weeks and was most significant in the last 3 days prior to admission. He had also developed chest pain that felt like pressure with no radiation.  No palpitation dizziness or syncope.  Per patient he was compliant with medications. He presented to the [facility] and was transferred to [facility] for further care.  Hospital Course     Consults: Psych   1. Acute on chronic systolic CHF: BNP > 2000 on admission. Etiology of low LVEF previously felt potentially related to alcohol and/or HTN. However significantly elevated troponin this admission. - diuresed with IV lasix and net - 3.4L. Weight at discharge 231.8lb.  - medical therapy with coreg, losartan, spiro and lasix 40mg  BID - educated on diet and daily weights during admission  2. Acute on CKD III - Baseline creatinine of 1.3-1.5. Cr with improvement to 1.4 on the day of discharge. Will plan for recheck of BMET at office appt.  3. NSTEMI - Troponin peaked at 8. Placed on IV heparin. The need for cardiac cath was discussed with the patient via Dr. Excell Seltzer but patient declined to have invasive procedure. Preferred to be treated medically. Placed on DAPT with ASA/plavix - high dose statin added on the day of discharge  4. Elevated LFTs: stabilized prior to discharge.  - possibly due acute CHF  5. HTN - BP stable with current regimen.   5. Polysubstance abuse - Cocaine + this admission. Counseled on need for cessation via MD.  6. PTSD/Depression: seen by psychiatry with recommendation to start Lexapro and prazosin. QT interval increased and Lexapro stopped. Will plan for repeat EKG at follow up appt.  7. NSVT: noted on telemetry. Asymptomatic. Electrolytes stable. BB titrated as tolerated.  General: Well developed, well nourished, male appearing in no acute distress.  Patient resting comfortably in bed. Head: Normocephalic, atraumatic.      Neck: Supple without bruits, JVD Lungs:  Resp regular and unlabored, CTA. Heart: RRR, S1, S2, no S3, S4,  or murmur; no rub. Abdomen: Soft, non-tender, non-distended with normoactive bowel sounds. No hepatomegaly. No rebound/guarding. No obvious abdominal masses. Extremities: No clubbing, cyanosis, no edema. Distal pedal pulses are 2+ bilaterally.  Neuro: Alert and oriented X 3. Moves all extremities spontaneously. Psych:  Normal affect.  Dale [name] was seen by Dr. Excell Seltzer and determined stable for discharge home. Follow up in the office has been arranged. Medications are listed below.   _____________  Discharge Vitals Blood pressure 134/85, pulse 81, temperature 98.3 F (36.8 C), temperature source Oral, resp. rate 20, height  (1.803 m), weight 105.1 kg, SpO2 96 %.  Filed Weights   03/01/19 0512 03/02/19 0608 03/03/19 0442  Weight: 110.6 kg 106.8 kg 105.1 kg    Labs & Radiologic Studies    CBC Recent Labs    03/01/19 0534  WBC 6.0  HGB 11.1*  HCT 32.8*  MCV 83.0  PLT 310   Basic Metabolic Panel Recent Labs    04/54/09 0351 03/03/19 0256  NA 140 138  K 3.6 3.8  CL 106 105  CO2 25 25  GLUCOSE 99 95  BUN 19 16  CREATININE 1.32* 1.42*  CALCIUM 8.0* 8.4*   Liver Function Tests Recent Labs    03/02/19 0351 03/03/19 0256  AST 41 24  ALT 79* 64*  ALKPHOS 78 77  BILITOT 0.6 0.5  PROT 5.1* 5.5*  ALBUMIN 2.5* 2.6*   No results for input(s): LIPASE, AMYLASE in the last 72 hours. Cardiac Enzymes Recent Labs    02/28/19 1647 02/28/19 2039 03/01/19 0534  TROPONINI 7.04* 5.36* 5.85*   BNP Invalid input(s): POCBNP D-Dimer No results for input(s): DDIMER in the last 72 hours. Hemoglobin A1C No results for input(s): HGBA1C in the last 72 hours. Fasting Lipid Panel No results for input(s): CHOL, HDL, LDLCALC, TRIG, CHOLHDL, LDLDIRECT in the last 72 hours. Thyroid Function Tests No results for input(s): TSH, T4TOTAL, T3FREE, THYROIDAB in the last 72 hours.  Invalid input(s): FREET3 _____________  Dg Chest 2 View  Result Date: 02/28/2019 CLINICAL DATA:  Shortness of breath.  Cough. EXAM: CHEST - 2 VIEW COMPARISON:  December 25, 2018 FINDINGS: Cardiomegaly. Hila, mediastinum, lungs, and pleura are otherwise normal. IMPRESSION: Cardiomegaly.  No other abnormalities. Electronically Signed   By: Gerome Sam III M.D   On: 02/28/2019 12:01   Disposition   Pt is being  discharged home today in good condition.  Follow-up Plans & Appointments    Follow-up Information    [facility] Follow up on 03/11/2019.   Why:  @ 9:30 am for hospital follow up with PA McClung. If you can not make this scheduled appointment please call to reschedule. Pharmacy onsite with medications ranging from $4.00-$10.00 Contact information: [address] [place] [address] [phone]       Kathleene Hazel, MD Follow up on 03/12/2019.   Specialty:  Cardiology Why:  at 8am for your follow up appt. This will be an IN OFFICE visit. Not a mobile phone appt.  Contact information: [address] [place] [address] [phone]          Discharge Instructions    (HEART FAILURE PATIENTS) Call MD:  Anytime you have any of the following symptoms: 1) 3 pound weight gain in 24 hours or 5 pounds in 1 week 2) shortness of breath, with or without a dry hacking cough 3) swelling in the hands, feet or stomach 4) if you have to  sleep on extra pillows at night in order to breathe.   Complete by:  As directed    Diet - low sodium heart healthy   Complete by:  As directed    Discharge instructions   Complete by:  As directed    Please take all medications as prescribed and keep your follow up appt for next week.   For patients with congestive heart failure, we give them these special instructions:  1. Follow a low-salt diet and watch your fluid intake. In general, you should not be taking in more than 2 liters of fluid per day (no more than 8 glasses per day). Some patients are restricted to less than 1.5 liters of fluid per day (no more than 6 glasses per day). This includes sources of water in foods like soup, coffee, tea, milk, etc. 2. Weigh yourself on the same scale at same time of day and keep a log. 3. Call your doctor: (Anytime you feel any of the following symptoms)  - 3-4 pound weight gain in 1-2 days or 2  pounds overnight  - Shortness of breath, with or without a dry hacking cough  - Swelling in the hands, feet or stomach  - If you have to sleep on extra pillows at night in order to breathe   IT IS IMPORTANT TO LET YOUR DOCTOR KNOW EARLY ON IF YOU ARE HAVING SYMPTOMS SO WE CAN HELP YOU!   Increase activity slowly   Complete by:  As directed      Discharge Medications     Medication List    STOP taking these medications   potassium chloride SA 20 MEQ tablet Commonly known as:  K-DUR   torsemide 20 MG tablet Commonly known as:  DEMADEX     TAKE these medications   aspirin 81 MG EC tablet Take 1 tablet (81 mg total) by mouth daily.   atorvastatin 80 MG tablet Commonly known as:  Lipitor Take 1 tablet (80 mg total) by mouth daily.   carvedilol 12.5 MG tablet Commonly known as:  COREG Take 1 tablet (12.5 mg total) by mouth 2 (two) times daily with a meal. What changed:    medication strength  how much to take   clopidogrel 75 MG tablet Commonly known as:  PLAVIX Take 1 tablet (75 mg total) by mouth daily.   furosemide 40 MG tablet Commonly known as:  LASIX Take 1 tablet (40 mg total) by mouth 2 (two) times daily.   isosorbide-hydrALAZINE 20-37.5 MG tablet Commonly known as:  BIDIL Take 1 tablet by mouth 3 (three) times daily.   losartan 25 MG tablet Commonly known as:  COZAAR Take 1 tablet (25 mg total) by mouth daily. What changed:    medication strength  how much to take   prazosin 1 MG capsule Commonly known as:  MINIPRESS Take 1 capsule (1 mg total) by mouth at bedtime.   spironolactone 25 MG tablet Commonly known as:  ALDACTONE Take 0.5 tablets (12.5 mg total) by mouth daily.        Acute coronary syndrome (MI, NSTEMI, STEMI, etc) this admission?: Yes.     AHA/ACC Clinical Performance & Quality Measures: 1. Aspirin prescribed? - Yes 2. ADP Receptor Inhibitor (Plavix/Clopidogrel, Brilinta/Ticagrelor or Effient/Prasugrel) prescribed (includes  medically managed patients)? - Yes 3. Beta Blocker prescribed? - Yes 4. High Intensity Statin (Lipitor 40-80mg  or Crestor 20-40mg ) prescribed? - Yes 5. EF assessed during THIS hospitalization? - Yes 6. For EF <40%, was ACEI/ARB prescribed? -  Yes 7. For EF <40%, Aldosterone Antagonist (Spironolactone or Eplerenone) prescribed? - Yes 8. Cardiac Rehab Phase II ordered (Included Medically managed Patients)? - No - Patient declined.      Outstanding Labs/Studies   BMET at follow up appt with EKG.  Duration of Discharge Encounter   Greater than 30 minutes including physician time.  Signed, Laverda Page NP-C 03/03/2019, 10:39 AM  Patient seen, examined. Available data reviewed. Agree with findings, assessment, and plan as outlined by Laverda Page, NP.  My personal findings from today's physical examination are documented above.  The patient has demonstrated marked improvement in his acute on chronic systolic heart failure with IV diuresis.  I have personally reviewed his telemetry from overnight which demonstrates episodes of nonsustained VT up to 24 beats.  The patient has been completely asymptomatic.  He does have frequent PVCs.  EKG demonstrates prolonged QT with a QT interval of 458 and a QTC of 528 ms.  He is having no anginal chest pain.  He has refused cardiac catheterization.  He prefers a strategy of medical therapy.  The patient has been started on dual antiplatelet therapy.  His liver function tests are trending down and I suspect the elevation was related to acute heart failure.  I think he is suitable now to start on a high intensity statin drug.  I am going to increase his carvedilol dose in the setting of frequent ventricular ectopy.  I am going to stop Lexapro which was initiated during this hospitalization because of prolonged QT.  His other heart failure medications will be continued.  We will also add Spironolactone 12.5 mg.  We are working with the case Production designer, theatre/television/film and Arts development officer, appreciate their help with his case.  I think the patient is stable for hospital discharge today.  He should have close outpatient follow-up with a repeat EKG and a complete metabolic panel to follow liver and renal function in 1 week.  Tonny Bollman, M.D. 03/03/2019 10:39 AM

## 2019-03-03 NOTE — TOC Transition Note (Signed)
Transition of Care [facility]) - CM/SW Discharge Note   Patient Details  Name: Dale [name] MRN: [id number] Date of Birth: 1961/09/16  [facility] CM/SW Contact:  Gala Lewandowsky, RN Phone Number: 03/03/2019, 11:01 AM   Clinical Narrative:   Patient plan to transition out of hospital. Pt was provided resources for homeless shelters. MATCH was provided and the cost was overridden. TOC pharmacy will deliver medications to the bedside. No further needs from CM at this time.    Final next level of care: Home/Self Care Barriers to Discharge: No Barriers Identified    Discharge Plan and Services   Discharge Planning Services: Follow-up appt scheduled, [facility] Post Acute Care Choice: NA          DME Arranged: N/A     Readmission Risk Interventions No flowsheet data found.

## 2019-03-03 NOTE — Progress Notes (Signed)
Pt noted to have 24 beat run [facility] on telemetry. Pt remains asymptomatic. On-call cardiologist notified. Will continue to monitor patient status closely.

## 2019-03-08 ENCOUNTER — Telehealth: Payer: Self-pay | Admitting: Cardiovascular Disease

## 2019-03-08 NOTE — Telephone Encounter (Signed)
° °  5/4 :   Left VM for patient to return my call. Visit needs to be change to Virtual visit. Also need consent from patient for appt. When patient returns call please transfer call to me. Cell [phone]

## 2019-03-10 ENCOUNTER — Inpatient Hospital Stay (HOSPITAL_COMMUNITY)
Admission: EM | Admit: 2019-03-10 | Discharge: 2019-03-13 | DRG: 291 | Disposition: A | Discharge status: Home / Self Care | Payer: Medicaid Other | Attending: Internal Medicine | Admitting: Internal Medicine

## 2019-03-10 ENCOUNTER — Emergency Department (HOSPITAL_COMMUNITY): Payer: Medicaid Other

## 2019-03-10 ENCOUNTER — Encounter (HOSPITAL_COMMUNITY): Payer: Self-pay | Admitting: Emergency Medicine

## 2019-03-10 ENCOUNTER — Other Ambulatory Visit: Payer: Self-pay

## 2019-03-10 DIAGNOSIS — I472 Ventricular tachycardia: Secondary | ICD-10-CM | POA: Diagnosis present

## 2019-03-10 DIAGNOSIS — E876 Hypokalemia: Secondary | ICD-10-CM | POA: Diagnosis present

## 2019-03-10 DIAGNOSIS — Z9114 Patient's other noncompliance with medication regimen: Secondary | ICD-10-CM

## 2019-03-10 DIAGNOSIS — N182 Chronic kidney disease, stage 2 (mild): Secondary | ICD-10-CM | POA: Diagnosis present

## 2019-03-10 DIAGNOSIS — F1010 Alcohol abuse, uncomplicated: Secondary | ICD-10-CM | POA: Diagnosis present

## 2019-03-10 DIAGNOSIS — R079 Chest pain, unspecified: Secondary | ICD-10-CM

## 2019-03-10 DIAGNOSIS — F1410 Cocaine abuse, uncomplicated: Secondary | ICD-10-CM | POA: Diagnosis present

## 2019-03-10 DIAGNOSIS — I5043 Acute on chronic combined systolic (congestive) and diastolic (congestive) heart failure: Secondary | ICD-10-CM | POA: Diagnosis present

## 2019-03-10 DIAGNOSIS — Z590 Homelessness: Secondary | ICD-10-CM

## 2019-03-10 DIAGNOSIS — Z888 Allergy status to other drugs, medicaments and biological substances status: Secondary | ICD-10-CM

## 2019-03-10 DIAGNOSIS — Z7902 Long term (current) use of antithrombotics/antiplatelets: Secondary | ICD-10-CM

## 2019-03-10 DIAGNOSIS — Z20828 Contact with and (suspected) exposure to other viral communicable diseases: Secondary | ICD-10-CM | POA: Diagnosis present

## 2019-03-10 DIAGNOSIS — R7989 Other specified abnormal findings of blood chemistry: Secondary | ICD-10-CM

## 2019-03-10 DIAGNOSIS — F1911 Other psychoactive substance abuse, in remission: Secondary | ICD-10-CM

## 2019-03-10 DIAGNOSIS — Z7982 Long term (current) use of aspirin: Secondary | ICD-10-CM

## 2019-03-10 DIAGNOSIS — D649 Anemia, unspecified: Secondary | ICD-10-CM | POA: Diagnosis present

## 2019-03-10 DIAGNOSIS — I509 Heart failure, unspecified: Secondary | ICD-10-CM

## 2019-03-10 DIAGNOSIS — Z79899 Other long term (current) drug therapy: Secondary | ICD-10-CM

## 2019-03-10 DIAGNOSIS — R778 Other specified abnormalities of plasma proteins: Secondary | ICD-10-CM

## 2019-03-10 DIAGNOSIS — Z8249 Family history of ischemic heart disease and other diseases of the circulatory system: Secondary | ICD-10-CM

## 2019-03-10 DIAGNOSIS — I252 Old myocardial infarction: Secondary | ICD-10-CM

## 2019-03-10 DIAGNOSIS — I130 Hypertensive heart and chronic kidney disease with heart failure and stage 1 through stage 4 chronic kidney disease, or unspecified chronic kidney disease: Principal | ICD-10-CM | POA: Diagnosis present

## 2019-03-10 DIAGNOSIS — I10 Essential (primary) hypertension: Secondary | ICD-10-CM | POA: Diagnosis present

## 2019-03-10 LAB — CBC WITH DIFFERENTIAL/PLATELET
Abs Immature Granulocytes: 0.04 10*3/uL (ref 0.00–0.07)
Basophils Absolute: 0 10*3/uL (ref 0.0–0.1)
Basophils Relative: 1 %
Eosinophils Absolute: 0 10*3/uL (ref 0.0–0.5)
Eosinophils Relative: 0 %
HCT: 37.5 % — ABNORMAL LOW (ref 39.0–52.0)
Hemoglobin: 12.2 g/dL — ABNORMAL LOW (ref 13.0–17.0)
Immature Granulocytes: 1 %
Lymphocytes Relative: 19 %
Lymphs Abs: 1.4 10*3/uL (ref 0.7–4.0)
MCH: 27.9 pg (ref 26.0–34.0)
MCHC: 32.5 g/dL (ref 30.0–36.0)
MCV: 85.8 fL (ref 80.0–100.0)
Monocytes Absolute: 0.6 10*3/uL (ref 0.1–1.0)
Monocytes Relative: 8 %
Neutro Abs: 5.1 10*3/uL (ref 1.7–7.7)
Neutrophils Relative %: 71 %
Platelets: 370 10*3/uL (ref 150–400)
RBC: 4.37 MIL/uL (ref 4.22–5.81)
RDW: 15.4 % (ref 11.5–15.5)
WBC: 7.3 10*3/uL (ref 4.0–10.5)
nRBC: 0 % (ref 0.0–0.2)

## 2019-03-10 NOTE — ED Notes (Signed)
Patient transported to X-ray 

## 2019-03-10 NOTE — ED Provider Notes (Signed)
[facility] EMERGENCY DEPARTMENT Provider Note   CSN: [id number] Arrival date & time: 03/10/19  2218    History   Chief Complaint Chief Complaint  Patient presents with  . Shortness of Breath    HPI Dale [name] is a 58 y.o. male with h/o chronic diastolic HF EF 25%, HTN, cocaine use, ETOH abuse, poor medical compliance, NSVT, recent admission NSTEMI presents for evaluation of shortness of breath x 3 days worse with laying down and sleeping alleviated by sitting up.  Associated with exertional light-headedness.  Transient resolved lower extremity edema.  Recent admission for NSTEMI and acute on chronic HF, patient left hospital AMA on 4/29 and declined LHC by Dr Excell Seltzer and opted for medical management.  He was diuresed to weight 231.8# and discharged on ASA/plavix, lasix, spironolactone, coreg, losartan.  States he has been "trying" to take all his medicines but may have missed one or two doses.  Take 7-8 at a time but doesn't know which one is for what.  Last cocaine and ETOH use one week ago.  Doesn't weight himself.    Denies fever, cough, CP, asymmetric LE edema or calf pain.  No travel. No exposure to sick contacts or suspected/confirmed COVID.      HPI  Past Medical History:  Diagnosis Date  . Alcohol abuse   . Chronic combined systolic and diastolic CHF (congestive heart failure) (HCC)    a. 09/27/18 showed mild LVH, EF 20-25%, grade 2 DD, mild MR, severely dilated LA, mildly dilated RV with mildly reduced RV function, mod RAE.  Marland Kitchen Cocaine abuse (HCC)   . Hypertension     Patient Active Problem List   Diagnosis Date Noted  . Hyperlipidemia 03/03/2019  . NSVT (nonsustained ventricular tachycardia) (HCC) 03/03/2019  . Anxiety and depression   . NSTEMI (non-ST elevated myocardial infarction) (HCC) 02/28/2019  . Acute systolic heart failure (HCC) 11/08/2018  . Hypertensive urgency 09/28/2018  . Acute pulmonary edema (HCC) 09/28/2018  . Cocaine abuse (HCC)  09/28/2018  . Substance abuse (HCC) 09/28/2018  . Chronic kidney disease, stage II (mild) 09/28/2018  . Normochromic anemia 09/28/2018  . Acute systolic CHF (congestive heart failure) (HCC) 09/27/2018  . Essential hypertension 03/11/2016  . Obesity 03/11/2016  . Glucosuria 03/11/2016    History reviewed. No pertinent surgical history.      Home Medications    Prior to Admission medications   Medication Sig Start Date End Date Taking? Authorizing Provider  aspirin EC 81 MG EC tablet Take 1 tablet (81 mg total) by mouth daily. 03/03/19   Arty Baumgartner, NP  atorvastatin (LIPITOR) 80 MG tablet Take 1 tablet (80 mg total) by mouth daily. 03/03/19 07/01/19  Arty Baumgartner, NP  carvedilol (COREG) 12.5 MG tablet Take 1 tablet (12.5 mg total) by mouth 2 (two) times daily with a meal. 03/03/19   Arty Baumgartner, NP  clopidogrel (PLAVIX) 75 MG tablet Take 1 tablet (75 mg total) by mouth daily. 03/03/19   Arty Baumgartner, NP  furosemide (LASIX) 40 MG tablet Take 1 tablet (40 mg total) by mouth 2 (two) times daily. 03/03/19   Arty Baumgartner, NP  isosorbide-hydrALAZINE (BIDIL) 20-37.5 MG tablet Take 1 tablet by mouth 3 (three) times daily. 11/12/18   Osvaldo Shipper, MD  losartan (COZAAR) 25 MG tablet Take 1 tablet (25 mg total) by mouth daily. 03/03/19   Arty Baumgartner, NP  prazosin (MINIPRESS) 1 MG capsule Take 1 capsule (1 mg total) by mouth at  bedtime. 03/03/19   Arty Baumgartner, NP  spironolactone (ALDACTONE) 25 MG tablet Take 0.5 tablets (12.5 mg total) by mouth daily. 03/03/19   Arty Baumgartner, NP    Family History Family History  Problem Relation Age of Onset  . Hypertension Maternal Grandmother     Social History Social History   Tobacco Use  . Smoking status: Never Smoker  . Smokeless tobacco: Never Used  Substance Use Topics  . Alcohol use: Yes    Comment: 2 quarts a week   . Drug use: Yes    Frequency: 3.0 times per week    Types: Marijuana, Cocaine      Allergies   Hydrocodone   Review of Systems Review of Systems  Respiratory: Positive for shortness of breath.   Cardiovascular: Positive for leg swelling.  Neurological: Positive for light-headedness.  Hematological: Bruises/bleeds easily.  All other systems reviewed and are negative.    Physical Exam Updated Vital Signs BP (!) 158/124   Pulse 97   Temp 98.4 F (36.9 C) (Oral)   Resp 17   SpO2 99%   Physical Exam Vitals signs and nursing note reviewed.  Constitutional:      Appearance: He is well-developed.     Comments: NAD.  HENT:     Head: Normocephalic and atraumatic.     Right Ear: External ear normal.     Left Ear: External ear normal.     Nose: Nose normal.  Eyes:     General: No scleral icterus.    Conjunctiva/sclera: Conjunctivae normal.  Neck:     Musculoskeletal: Normal range of motion and neck supple.  Cardiovascular:     Rate and Rhythm: Normal rate and regular rhythm.     Heart sounds: Normal heart sounds.     Comments: 1+ radial and DP pulses bilaterally. Minimal bilateral mid foot non pitting edema, no calf tenderness.  Pulmonary:     Effort: Pulmonary effort is normal.     Comments: SpO2 92-96% during exam. Normal WOB.  Diminished lungs sounds to lower lobes.  Slight end expiratory wheezing vs crackles to mid/upper lobes.   Musculoskeletal: Normal range of motion.        General: No deformity.  Skin:    General: Skin is warm and dry.     Capillary Refill: Capillary refill takes less than 2 seconds.  Neurological:     Mental Status: He is alert and oriented to person, place, and time.  Psychiatric:        Behavior: Behavior normal.        Thought Content: Thought content normal.        Judgment: Judgment normal.      ED Treatments / Results  Labs (all labs ordered are listed, but only abnormal results are displayed) Labs Reviewed  CBC WITH DIFFERENTIAL/PLATELET - Abnormal; Notable for the following components:      Result Value    Hemoglobin 12.2 (*)    HCT 37.5 (*)    All other components within normal limits  SARS CORONAVIRUS 2 (HOSPITAL ORDER, PERFORMED IN [facility])  BRAIN NATRIURETIC PEPTIDE  TROPONIN I  RAPID URINE DRUG SCREEN, HOSP PERFORMED  COMPREHENSIVE METABOLIC PANEL    EKG EKG Interpretation  Date/Time:  Wednesday Mar 10 2019 22:22:16 EDT Ventricular Rate:  89 PR Interval:    QRS Duration: 102 QT Interval:  389 QTC Calculation: 474 R Axis:   54 Text Interpretation:  Sinus rhythm Borderline prolonged PR interval Probable left atrial  enlargement LVH with secondary repolarization abnormality Confirmed by Tilden Fossaees, Elizabeth [phone]([id number]) on 03/10/2019 10:26:06 PM   Radiology Dg Chest 1 View  Result Date: 03/10/2019 CLINICAL DATA:  Chest pain EXAM: CHEST  1 VIEW COMPARISON:  02/28/2019 FINDINGS: Cardiomegaly, vascular congestion. No confluent opacities, effusions or edema. No acute bony abnormality. IMPRESSION: Cardiomegaly with mild vascular congestion. Electronically Signed   By: Charlett NoseKevin  Dover M.D.   On: 03/10/2019 23:07    Procedures Procedures (including critical care time)  Medications Ordered in ED Medications - No data to display   Initial Impression / Assessment and Plan / ED Course  I have reviewed the triage vital signs and the nursing notes.  Pertinent labs & imaging results that were available during my care of the patient were reviewed by me and considered in my medical decision making (see chart for details).       Concern for acute on chronic HF, pulmonary edema likely from med non compliance, polysubstance abuse.  No CP.  No asymmetric LE edema or calf tenderness, on plavix and PE is unlikely. No cough, fever, exposures to sick contacts/COVId, travel and this is less likely however recent hospitalization puts him at risk. Will obtain labs, CXR, COVID swab.  SpO2 WNL and stable, normal work of breathing upon initial assessment.   2357: CXR with vascular congestion. CBC  unremarkable.  HD stable. Respiratory status stable.  Will hand patient to oncoming EDPA who will f/u on labs and determine disposition.  Anticipate discharge. Cardiology wanted to do LHC during last admission. Patient does not have a PCP, per chart Julianne HandlerLachina Hollis NP.    Final Clinical Impressions(s) / ED Diagnoses   Final diagnoses:  Chest pain    ED Discharge Orders    None       Jerrell MylarGibbons, Kohler Pellerito J, PA-C 03/10/19 2359    Tilden Fossaees, Elizabeth, MD 03/12/19 [phone]

## 2019-03-10 NOTE — ED Triage Notes (Signed)
Per [facility], Pt from home with complaints of SHOB x 1 week, worsening over the last few days. Pt reports recent dx of CHF. Ems reports lung sounds with fine crackles in bases. Pt reports taking his medication but reports he has difficulty keeping up with when he is supposed to take them. Pt denies CP, reports weakness.

## 2019-03-10 NOTE — Progress Notes (Deleted)
Patient ID: Dale [name], male   DOB: [birth year], 58 y.o.   MRN: [id number]  After hospitalization 4/26-4/29 for MI.   From discharge summary: Principal Problem:   NSTEMI (non-ST elevated myocardial infarction) (HCC) Active Problems:   Essential hypertension   Acute systolic CHF (congestive heart failure) (HCC)   Cocaine abuse (HCC)   Chronic kidney disease, stage II (mild)   Anxiety and depression   Hyperlipidemia   NSVT (nonsustained ventricular tachycardia) (HCC)  HPI: [name] h/ouncontrolled hypertension and polysubstance abuse including cocaine and alcohol, who has been followed by Dr Elease Hashimoto forchronic systolicCHF. He was admitted inJanuary 2020 with acute on chronic combined systolic diastolic CHF. Found to be hypertensive and with associated pulmonary edema.He was diuresed.Had not been on any hypertensive regimen at home due to lack of insurance and no PCP. He had continued to use cocaine prior to the admission. Lots of heavy alcohol use. Echo with EF of 25% with grade 2 DD. Left AMA. Homeless,lived in a long-term hotel settings, noncompliant with diet, he stated he would eat mostly [employer].  He presented with progressively worsening dyspnea on exertion now at rest, two-pillow orthopnea no proximal nocturnal dyspnea and significant lower extremity edema. This had progressed over last 2 weeks and was most significant in the last 3 days prior to admission. He had also developed chest pain that felt like pressure with no radiation. No palpitation dizziness or syncope. Per patient he was compliant with medications. He presented to the [facility] and was transferred to [facility] for further care.  Hospital course: Consults: Psych   1. Acute on chronic systolic CHF: BNP >2000 on admission. Etiology of low LVEF previously felt potentially related to alcohol and/or HTN. However significantly elevated troponin this admission. - diuresed with IV lasix and net - 3.4L. Weight at  discharge 231.8lb.  - medical therapy with coreg, losartan, spiro and lasix 40mg  BID - educated on diet and daily weights during admission  2. Acute on CKD III - Baseline creatinine of 1.3-1.5.Cr with improvement to 1.4 on the day of discharge. Will plan for recheck of BMET at office appt.  3. NSTEMI - Troponin peaked at 8. Placed on IV heparin. The need for cardiac cath was discussed with the patient via Dr. Excell Seltzer but patient declined to have invasive procedure. Preferred to be treated medically. Placed on DAPT with ASA/plavix - high dose statin added on the day of discharge  4. Elevated LFTs: stabilized prior to discharge.  - possibly due acute CHF  5. HTN - BP stable with current regimen.   5. Polysubstance abuse - Cocaine + this admission. Counseled on need for cessation via MD.  6. PTSD/Depression: seen by psychiatry with recommendation to start Lexapro and prazosin. QT interval increased and Lexapro stopped. Will plan for repeat EKG at follow up appt.  7. NSVT: noted on telemetry. Asymptomatic. Electrolytes stable. BB titrated as tolerated.

## 2019-03-11 ENCOUNTER — Ambulatory Visit: Payer: Self-pay | Attending: Family Medicine

## 2019-03-11 ENCOUNTER — Observation Stay (HOSPITAL_BASED_OUTPATIENT_CLINIC_OR_DEPARTMENT_OTHER): Payer: Medicaid Other

## 2019-03-11 DIAGNOSIS — Z20828 Contact with and (suspected) exposure to other viral communicable diseases: Secondary | ICD-10-CM | POA: Diagnosis present

## 2019-03-11 DIAGNOSIS — E876 Hypokalemia: Secondary | ICD-10-CM | POA: Diagnosis present

## 2019-03-11 DIAGNOSIS — Z79899 Other long term (current) drug therapy: Secondary | ICD-10-CM | POA: Diagnosis not present

## 2019-03-11 DIAGNOSIS — Z590 Homelessness: Secondary | ICD-10-CM | POA: Diagnosis not present

## 2019-03-11 DIAGNOSIS — R7989 Other specified abnormal findings of blood chemistry: Secondary | ICD-10-CM

## 2019-03-11 DIAGNOSIS — F1410 Cocaine abuse, uncomplicated: Secondary | ICD-10-CM | POA: Diagnosis present

## 2019-03-11 DIAGNOSIS — I130 Hypertensive heart and chronic kidney disease with heart failure and stage 1 through stage 4 chronic kidney disease, or unspecified chronic kidney disease: Secondary | ICD-10-CM | POA: Diagnosis present

## 2019-03-11 DIAGNOSIS — Z7982 Long term (current) use of aspirin: Secondary | ICD-10-CM | POA: Diagnosis not present

## 2019-03-11 DIAGNOSIS — Z7902 Long term (current) use of antithrombotics/antiplatelets: Secondary | ICD-10-CM | POA: Diagnosis not present

## 2019-03-11 DIAGNOSIS — D649 Anemia, unspecified: Secondary | ICD-10-CM | POA: Diagnosis present

## 2019-03-11 DIAGNOSIS — I340 Nonrheumatic mitral (valve) insufficiency: Secondary | ICD-10-CM

## 2019-03-11 DIAGNOSIS — N182 Chronic kidney disease, stage 2 (mild): Secondary | ICD-10-CM | POA: Diagnosis present

## 2019-03-11 DIAGNOSIS — Z9114 Patient's other noncompliance with medication regimen: Secondary | ICD-10-CM | POA: Diagnosis not present

## 2019-03-11 DIAGNOSIS — I5043 Acute on chronic combined systolic (congestive) and diastolic (congestive) heart failure: Secondary | ICD-10-CM | POA: Diagnosis present

## 2019-03-11 DIAGNOSIS — R778 Other specified abnormalities of plasma proteins: Secondary | ICD-10-CM

## 2019-03-11 DIAGNOSIS — Z888 Allergy status to other drugs, medicaments and biological substances status: Secondary | ICD-10-CM | POA: Diagnosis not present

## 2019-03-11 DIAGNOSIS — I361 Nonrheumatic tricuspid (valve) insufficiency: Secondary | ICD-10-CM

## 2019-03-11 DIAGNOSIS — Z8249 Family history of ischemic heart disease and other diseases of the circulatory system: Secondary | ICD-10-CM | POA: Diagnosis not present

## 2019-03-11 DIAGNOSIS — I252 Old myocardial infarction: Secondary | ICD-10-CM | POA: Diagnosis not present

## 2019-03-11 DIAGNOSIS — I509 Heart failure, unspecified: Secondary | ICD-10-CM

## 2019-03-11 DIAGNOSIS — I472 Ventricular tachycardia: Secondary | ICD-10-CM | POA: Diagnosis present

## 2019-03-11 DIAGNOSIS — F1010 Alcohol abuse, uncomplicated: Secondary | ICD-10-CM | POA: Diagnosis present

## 2019-03-11 LAB — BASIC METABOLIC PANEL
Anion gap: 12 (ref 5–15)
BUN: 23 mg/dL — ABNORMAL HIGH (ref 6–20)
CO2: 23 mmol/L (ref 22–32)
Calcium: 8.7 mg/dL — ABNORMAL LOW (ref 8.9–10.3)
Chloride: 105 mmol/L (ref 98–111)
Creatinine, Ser: 1.46 mg/dL — ABNORMAL HIGH (ref 0.61–1.24)
GFR calc Af Amer: >60 mL/min (ref >60)
GFR calc non Af Amer: 53 mL/min — ABNORMAL LOW (ref >60)
Glucose, Bld: 99 mg/dL (ref 70–99)
Potassium: 3.3 mmol/L — ABNORMAL LOW (ref 3.5–5.1)
Sodium: 140 mmol/L (ref 135–145)

## 2019-03-11 LAB — COMPREHENSIVE METABOLIC PANEL
ALT: 64 U/L — ABNORMAL HIGH (ref 0–44)
AST: 43 U/L — ABNORMAL HIGH (ref 15–41)
Albumin: 3.2 g/dL — ABNORMAL LOW (ref 3.5–5.0)
Alkaline Phosphatase: 100 U/L (ref 38–126)
Anion gap: 9 (ref 5–15)
BUN: 26 mg/dL — ABNORMAL HIGH (ref 6–20)
CO2: 22 mmol/L (ref 22–32)
Calcium: 9 mg/dL (ref 8.9–10.3)
Chloride: 107 mmol/L (ref 98–111)
Creatinine, Ser: 1.56 mg/dL — ABNORMAL HIGH (ref 0.61–1.24)
GFR calc Af Amer: 57 mL/min — ABNORMAL LOW (ref >60)
GFR calc non Af Amer: 49 mL/min — ABNORMAL LOW (ref >60)
Glucose, Bld: 104 mg/dL — ABNORMAL HIGH (ref 70–99)
Potassium: 4.2 mmol/L (ref 3.5–5.1)
Sodium: 138 mmol/L (ref 135–145)
Total Bilirubin: 0.9 mg/dL (ref 0.3–1.2)
Total Protein: 6 g/dL — ABNORMAL LOW (ref 6.5–8.1)

## 2019-03-11 LAB — HEPATIC FUNCTION PANEL
ALT: 66 U/L — ABNORMAL HIGH (ref 0–44)
AST: 41 U/L (ref 15–41)
Albumin: 3.3 g/dL — ABNORMAL LOW (ref 3.5–5.0)
Alkaline Phosphatase: 98 U/L (ref 38–126)
Bilirubin, Direct: 0.2 mg/dL (ref 0.0–0.2)
Indirect Bilirubin: 0.8 mg/dL (ref 0.3–0.9)
Total Bilirubin: 1 mg/dL (ref 0.3–1.2)
Total Protein: 6.7 g/dL (ref 6.5–8.1)

## 2019-03-11 LAB — GLUCOSE, CAPILLARY: Glucose-Capillary: 207 mg/dL — ABNORMAL HIGH (ref 70–99)

## 2019-03-11 LAB — ECHOCARDIOGRAM COMPLETE
Height: 71 in
Weight: 3734.4 oz

## 2019-03-11 LAB — RAPID URINE DRUG SCREEN, HOSP PERFORMED
Amphetamines: NOT DETECTED
Barbiturates: NOT DETECTED
Benzodiazepines: NOT DETECTED
Cocaine: POSITIVE — AB
Opiates: NOT DETECTED
Tetrahydrocannabinol: NOT DETECTED

## 2019-03-11 LAB — MAGNESIUM: Magnesium: 1.8 mg/dL (ref 1.7–2.4)

## 2019-03-11 LAB — BRAIN NATRIURETIC PEPTIDE: B Natriuretic Peptide: 3804.7 pg/mL — ABNORMAL HIGH (ref 0.0–100.0)

## 2019-03-11 LAB — TROPONIN I
Troponin I: 0.07 ng/mL (ref <0.03)
Troponin I: 0.07 ng/mL (ref <0.03)
Troponin I: 0.06 ng/mL (ref <0.03)

## 2019-03-11 LAB — SARS CORONAVIRUS 2 BY RT PCR (HOSPITAL ORDER, PERFORMED IN ~~LOC~~ HOSPITAL LAB): SARS Coronavirus 2: NEGATIVE

## 2019-03-11 MED ORDER — ENOXAPARIN SODIUM 40 MG/0.4ML ~~LOC~~ SOLN
40.0000 mg | SUBCUTANEOUS | Status: DC
  Filled 2019-03-11 (×3): qty 0.4

## 2019-03-11 MED ORDER — LOSARTAN POTASSIUM 50 MG PO TABS
25.0000 mg | ORAL_TABLET | Freq: Every day | ORAL | Status: DC
  Administered 2019-03-11: 25 mg via ORAL
  Filled 2019-03-11: qty 1

## 2019-03-11 MED ORDER — CLOPIDOGREL BISULFATE 75 MG PO TABS
75.0000 mg | ORAL_TABLET | Freq: Every day | ORAL | Status: DC
  Administered 2019-03-11 – 2019-03-13 (×3): 75 mg via ORAL
  Filled 2019-03-11 (×3): qty 1

## 2019-03-11 MED ORDER — FOLIC ACID 1 MG PO TABS
1.0000 mg | ORAL_TABLET | Freq: Every day | ORAL | Status: DC
  Administered 2019-03-11 – 2019-03-13 (×3): 1 mg via ORAL
  Filled 2019-03-11 (×3): qty 1

## 2019-03-11 MED ORDER — ADULT MULTIVITAMIN W/MINERALS CH
1.0000 | ORAL_TABLET | Freq: Every day | ORAL | Status: DC
  Administered 2019-03-11 – 2019-03-13 (×3): 1 via ORAL
  Filled 2019-03-11 (×3): qty 1

## 2019-03-11 MED ORDER — CARVEDILOL 12.5 MG PO TABS
12.5000 mg | ORAL_TABLET | Freq: Two times a day (BID) | ORAL | Status: DC
  Administered 2019-03-11: 12.5 mg via ORAL
  Filled 2019-03-11: qty 1

## 2019-03-11 MED ORDER — POTASSIUM CHLORIDE CRYS ER 20 MEQ PO TBCR
40.0000 meq | EXTENDED_RELEASE_TABLET | Freq: Once | ORAL | Status: AC
  Administered 2019-03-11: 40 meq via ORAL
  Filled 2019-03-11: qty 2

## 2019-03-11 MED ORDER — PRAZOSIN HCL 1 MG PO CAPS
1.0000 mg | ORAL_CAPSULE | Freq: Every day | ORAL | Status: DC
  Administered 2019-03-11 – 2019-03-12 (×2): 1 mg via ORAL
  Filled 2019-03-11 (×3): qty 1

## 2019-03-11 MED ORDER — ASPIRIN EC 81 MG PO TBEC
81.0000 mg | DELAYED_RELEASE_TABLET | Freq: Every day | ORAL | Status: DC
  Administered 2019-03-11 – 2019-03-13 (×3): 81 mg via ORAL
  Filled 2019-03-11 (×3): qty 1

## 2019-03-11 MED ORDER — ISOSORB DINITRATE-HYDRALAZINE 20-37.5 MG PO TABS
1.0000 | ORAL_TABLET | Freq: Three times a day (TID) | ORAL | Status: DC
  Administered 2019-03-11 – 2019-03-13 (×7): 1 via ORAL
  Filled 2019-03-11 (×7): qty 1

## 2019-03-11 MED ORDER — POTASSIUM CHLORIDE CRYS ER 20 MEQ PO TBCR
20.0000 meq | EXTENDED_RELEASE_TABLET | Freq: Once | ORAL | Status: AC
  Administered 2019-03-11: 20 meq via ORAL
  Filled 2019-03-11: qty 1

## 2019-03-11 MED ORDER — FUROSEMIDE 10 MG/ML IJ SOLN
80.0000 mg | Freq: Two times a day (BID) | INTRAMUSCULAR | Status: DC
  Administered 2019-03-11 (×2): 80 mg via INTRAVENOUS
  Filled 2019-03-11 (×2): qty 8

## 2019-03-11 MED ORDER — LORAZEPAM 2 MG/ML IJ SOLN: 1.0000 mg | Freq: Four times a day (QID) | INTRAMUSCULAR | Status: DC | PRN

## 2019-03-11 MED ORDER — VITAMIN B-1 100 MG PO TABS
100.0000 mg | ORAL_TABLET | Freq: Every day | ORAL | Status: DC
  Administered 2019-03-11 – 2019-03-13 (×3): 100 mg via ORAL
  Filled 2019-03-11 (×3): qty 1

## 2019-03-11 MED ORDER — THIAMINE HCL 100 MG/ML IJ SOLN
100.0000 mg | Freq: Every day | INTRAMUSCULAR | Status: DC
  Filled 2019-03-11: qty 2

## 2019-03-11 MED ORDER — LORAZEPAM 1 MG PO TABS
1.0000 mg | ORAL_TABLET | Freq: Four times a day (QID) | ORAL | Status: DC | PRN
  Filled 2019-03-11: qty 1

## 2019-03-11 MED ORDER — FUROSEMIDE 10 MG/ML IJ SOLN
80.0000 mg | Freq: Once | INTRAMUSCULAR | Status: AC
  Administered 2019-03-11: 80 mg via INTRAVENOUS
  Filled 2019-03-11: qty 8

## 2019-03-11 MED ORDER — ATORVASTATIN CALCIUM 80 MG PO TABS
80.0000 mg | ORAL_TABLET | Freq: Every day | ORAL | Status: DC
  Administered 2019-03-11 – 2019-03-12 (×2): 80 mg via ORAL
  Filled 2019-03-11 (×2): qty 1

## 2019-03-11 NOTE — Progress Notes (Signed)
PROGRESS NOTE    Dale [name]  ELF:[id number] DOB: [birth year] DOA: 03/10/2019 PCP: Patient, No Pcp Per    Brief Narrative: 58 year old with past medical history significant for chronic combined systolic and diastolic congestive heart failure last ejection fraction 20 to 25% and grade 2 diastolic dysfunction, hypertension, chronic kidney disease, alcohol abuse, cocaine abuse who presents complaining of shortness of breath.  Patient was recently evaluated on April 2020 for CHF and non-STEMI.  At that time he declined heart cath.  Evaluation in the ED showed chest x-ray with cardiomegaly , pulmonary vascular congestion, BNP 3804.     Assessment & Plan:   Principal Problem:   Acute exacerbation of CHF (congestive heart failure) (HCC) Active Problems:   Essential hypertension   Cocaine abuse (HCC)   Chronic kidney disease, stage II (mild)   Troponin level elevated   1-Acute on chronic systolic Heart failure exacerbation.;  -Discharge weight 03/03/2019;  231. Patient presented with worsening shortness of breath, chest x-ray with pulmonary edema, elevated BNP, mild elevation of troponin. Relate that last time that he used cocaine was 3 weeks ago. Will hold carvedilol. Continue with IV Lasix.  Patient is still complaining of shortness of breath,  not back at baseline Repeated echo showed decreased ejection fraction similar as before.  Discussed with patient about the need for heart cath as recommended last admission. He  Wants to think about it. -3.8 L.  Weight 240 pounds on admission------233.  2-Mild elevation of troponin: Likely in the setting of heart failure exacerbation.  3-chronic kidney disease a stage II Monitor creatinine on IV Lasix.  4-transaminases: Suspect related to heart failure.  5-HTN: Continue with isosorbide hydralazine and losartan.  6-alcohol abuse: No signs of withdrawal monitor on CIWA  7-cocaine abuse: Counseling provided.  Patient he relate that he used  cocaine 3 weeks ago. UDS positive  Chronic normocytic anemia follow hemoglobin Hypokalemia replete with oral potassium  Estimated body mass index is 32.55 kg/m as calculated from the following:   Height as of this encounter: 5\' 11"  (1.803 m).   Weight as of this encounter: 105.9 kg.   DVT prophylaxis: Lovenox Code Status: Full code Family Communication: Care discussed with patient Disposition Plan: Patient required to stay in the hospital for IV Lasix, patient is still tachypneic and short of breath.   Consultants:   None   Procedures:  Echocardiogram ejection fraction 20% diffuse wall motion abnormality   Antimicrobials:   None   Subjective: Patient is still complaining of shortness of breath, reports chest pressure. He does not feel that his breathing at baseline.  Objective: Vitals:   03/10/19 2359 03/11/19 0000 03/11/19 0015 03/11/19 0243  BP:  (!) 140/106 (!) 156/110 (!) 162/111  Pulse:  89 87 89  Resp:  19 12 18   Temp:    98.2 F (36.8 C)  TempSrc:    Oral  SpO2:  97% 100% 100%  Weight: 108.9 kg   105.9 kg  Height: 5\' 11"  (1.803 m)   5\' 11"  (1.803 m)    Intake/Output Summary (Last 24 hours) at 03/11/2019 0735 Last data filed at 03/11/2019 5852 Gross per 24 hour  Intake --  Output 2400 ml  Net -2400 ml   Filed Weights   03/10/19 2359 03/11/19 0243  Weight: 108.9 kg 105.9 kg    Examination:  General exam: Appears calm and comfortable  Respiratory system: Tachypnea, bilateral crackles Cardiovascular system: S1 & S2 heard, RRR.  Positive JVD JVD, murmurs, rubs, gallops or clicks.  This edema Gastrointestinal system: Abdomen is nondistended, soft and nontender. No organomegaly or masses felt. Normal bowel sounds heard. Central nervous system: Alert and oriented. No focal neurological deficits. Extremities: Symmetric 5 x 5 power. Skin: No rashes, lesions or ulcers Psychiatry: Judgement and insight appear normal. Mood & affect appropriate.       Data Reviewed: I have personally reviewed following labs and imaging studies  CBC: Recent Labs  Lab 03/10/19 2309  WBC 7.3  NEUTROABS 5.1  HGB 12.2*  HCT 37.5*  MCV 85.8  PLT 370   Basic Metabolic Panel: Recent Labs  Lab 03/10/19 2309 03/11/19 0557  NA 138 140  K 4.2 3.3*  CL 107 105  CO2 22 23  GLUCOSE 104* 99  BUN 26* 23*  CREATININE 1.56* 1.46*  CALCIUM 9.0 8.7*  MG  --  1.8   GFR: Estimated Creatinine Clearance: 70.8 mL/min (A) (by C-G formula based on SCr of 1.46 mg/dL (H)). Liver Function Tests: Recent Labs  Lab 03/10/19 2309 03/11/19 0557  AST 43* 41  ALT 64* 66*  ALKPHOS 100 98  BILITOT 0.9 1.0  PROT 6.0* 6.7  ALBUMIN 3.2* 3.3*   No results for input(s): LIPASE, AMYLASE in the last 168 hours. No results for input(s): AMMONIA in the last 168 hours. Coagulation Profile: No results for input(s): INR, PROTIME in the last 168 hours. Cardiac Enzymes: Recent Labs  Lab 03/10/19 2309 03/11/19 0557  TROPONINI 0.07* 0.07*   BNP (last 3 results) No results for input(s): PROBNP in the last 8760 hours. HbA1C: No results for input(s): HGBA1C in the last 72 hours. CBG: No results for input(s): GLUCAP in the last 168 hours. Lipid Profile: No results for input(s): CHOL, HDL, LDLCALC, TRIG, CHOLHDL, LDLDIRECT in the last 72 hours. Thyroid Function Tests: No results for input(s): TSH, T4TOTAL, FREET4, T3FREE, THYROIDAB in the last 72 hours. Anemia Panel: No results for input(s): VITAMINB12, FOLATE, FERRITIN, TIBC, IRON, RETICCTPCT in the last 72 hours. Sepsis Labs: No results for input(s): PROCALCITON, LATICACIDVEN in the last 168 hours.  Recent Results (from the past 240 hour(s))  SARS Coronavirus 2 (CEPHEID- Performed [facility]), Hosp Order     Status: None   Collection Time: 03/10/19 11:09 PM  Result Value Ref Range Status   SARS Coronavirus 2 NEGATIVE NEGATIVE Final    Comment: (NOTE) If result is NEGATIVE SARS-CoV-2 target  nucleic acids are NOT DETECTED. The SARS-CoV-2 RNA is generally detectable in upper and lower  respiratory specimens during the acute phase of infection. The lowest  concentration of SARS-CoV-2 viral copies this assay can detect is 250  copies / mL. A negative result does not preclude SARS-CoV-2 infection  and should not be used as the sole basis for treatment or other  patient management decisions.  A negative result may occur with  improper specimen collection / handling, submission of specimen other  than nasopharyngeal swab, presence of viral mutation(s) within the  areas targeted by this assay, and inadequate number of viral copies  (<250 copies / mL). A negative result must be combined with clinical  observations, patient history, and epidemiological information. If result is POSITIVE SARS-CoV-2 target nucleic acids are DETECTED. The SARS-CoV-2 RNA is generally detectable in upper and lower  respiratory specimens dur ing the acute phase of infection.  Positive  results are indicative of active infection with SARS-CoV-2.  Clinical  correlation with patient history and other diagnostic information is  necessary to determine patient infection status.  Positive  results do  not rule out bacterial infection or co-infection with other viruses. If result is PRESUMPTIVE POSTIVE SARS-CoV-2 nucleic acids MAY BE PRESENT.   A presumptive positive result was obtained on the submitted specimen  and confirmed on repeat testing.  While 2019 novel coronavirus  (SARS-CoV-2) nucleic acids may be present in the submitted sample  additional confirmatory testing may be necessary for epidemiological  and / or clinical management purposes  to differentiate between  SARS-CoV-2 and other Sarbecovirus currently known to infect humans.  If clinically indicated additional testing with an alternate test  methodology [phone]) is advised. The SARS-CoV-2 RNA is generally  detectable in upper and lower  respiratory sp ecimens during the acute  phase of infection. The expected result is Negative. Fact Sheet for Patients:  BoilerBrush.com.cy Fact Sheet for Healthcare Providers: https://pope.com/ This test is not yet approved or cleared by the [place] FDA and has been authorized for detection and/or diagnosis of SARS-CoV-2 by FDA under an Emergency Use Authorization (EUA).  This EUA will remain in effect (meaning this test can be used) for the duration of the COVID-19 declaration under Section 564(b)(1) of the Act, 21 U.S.C. section 360bbb-3(b)(1), unless the authorization is terminated or revoked sooner. Performed at [facility], [address] [place], [place] [address]          Radiology Studies: Dg Chest 1 View  Result Date: 03/10/2019 CLINICAL DATA:  Chest pain EXAM: CHEST  1 VIEW COMPARISON:  02/28/2019 FINDINGS: Cardiomegaly, vascular congestion. No confluent opacities, effusions or edema. No acute bony abnormality. IMPRESSION: Cardiomegaly with mild vascular congestion. Electronically Signed   By: Charlett Nose M.D.   On: 03/10/2019 23:07        Scheduled Meds:  aspirin EC  81 mg Oral Daily   atorvastatin  80 mg Oral q1800   carvedilol  12.5 mg Oral BID WC   clopidogrel  75 mg Oral Daily   enoxaparin (LOVENOX) injection  40 mg Subcutaneous Q24H   folic acid  1 mg Oral Daily   furosemide  80 mg Intravenous BID   isosorbide-hydrALAZINE  1 tablet Oral TID   losartan  25 mg Oral Daily   multivitamin with minerals  1 tablet Oral Daily   potassium chloride  40 mEq Oral Once   prazosin  1 mg Oral QHS   thiamine  100 mg Oral Daily   Or   thiamine  100 mg Intravenous Daily   Continuous Infusions:   LOS: 0 days    Time spent: 35 minutes.    Alba Cory, MD [facility] Pager [phone]  If 7PM-7AM, please contact night-coverage [email or web address] Password [facility] 03/11/2019, 7:35  AM

## 2019-03-11 NOTE — Progress Notes (Addendum)
Pharmacy was unable to complete a detailed medication history. The patient is a poor historian. He is not able to verify what medications he is taking, how he is taking them, or when he last took any of them. A med history was completed on 02/28/19.   He was discharged 03/03/19 and received the medications on his PTA med list upon discharge form the [facility] pharmacy via the [facility] program.  Please contact pharmacy if further assistance is needed.   Charolotte Eke, PharmD. Mobile: [phone]. 03/11/2019,12:38 PM.

## 2019-03-11 NOTE — ED Provider Notes (Signed)
12:58 AM handoff from [place] PA-C at shift change.  Patient with history of mixed congestive heart failure presents with 3 days of worsening orthopnea and shortness of breath with exertion.  Patient is was admitted 2 weeks ago with end STEMI in the setting of cocaine use.  Patient denies any recent cocaine use.  He likely has not been taking his medications appropriately.  Denies any chest pain at current time.  Upon entering the room, patient is in no distress.  He asked that I raised the head of his bed up as he is having more shortness of breath lying flatter.  Review of patient's work-up shows chest x-ray with mild vascular congestion, elevated BNP, mild elevation in troponin consistent with baseline, weight gain of approximately 8 pounds since 4/29.  At previous discharge patient weighed 231.2 pounds and today weighs 239.6 pounds.  CKD at baseline. K=4.2. EKG with lateral T wave inversions similar to previous.  COVID-19 testing negative.  Patient diuresed well on 80 mg of Lasix twice daily on last hospitalization and this has been ordered.  Will request admission for continued diueresis.   BP (!) 158/124   Pulse 97   Temp 98.4 F (36.9 C) (Oral)   Resp 17   Ht 5\' 11"  (1.803 m)   Wt 108.9 kg   SpO2 99%   BMI 33.47 kg/m   1:37 AM Spoke with Dr. Loney Loh who will see.   BP (!) 156/110   Pulse 87   Temp 98.4 F (36.9 C) (Oral)   Resp 12   Ht 5\' 11"  (1.803 m)   Wt 108.9 kg   SpO2 100%   BMI 33.47 kg/m      Renne Crigler, PA-C 03/11/19 7858    Nira Conn, MD 03/11/19 [phone]

## 2019-03-11 NOTE — Telephone Encounter (Signed)
Pt was scheduled to see Dr. Clifton James on 03/12/19 for post hospital visit. He is currently in ED.  Reviewed with Dr. Clifton James and visit for tomorrow should be cancelled.

## 2019-03-11 NOTE — Progress Notes (Signed)
  Echocardiogram 2D Echocardiogram has been performed.  Dale [name] 03/11/2019, 10:05 AM

## 2019-03-11 NOTE — H&P (Signed)
History and Physical    Dale [name] MVV:[id number] DOB: 1961-10-04 DOA: 03/10/2019  PCP: Patient, No Pcp Per Patient coming from: Home  Chief Complaint: Shortness of breath  HPI: Dale [name] is a 58 y.o. male with medical history significant of chronic combined systolic and diastolic congestive heart failure (EF 20-25% and grade 2 diastolic dysfunction), hypertension, CKD 2, alcohol abuse, cocaine abuse presenting to the hospital for evaluation of shortness of breath.  Patient was recently admitted from April 26 to April 29 for acute CHF exacerbation and NSTEMI.  Patient had declined cardiac catheterization during this hospitalization and was treated medically for his NSTEMI.  He was started on dual antiplatelet therapy.  Patient states his breathing has not improved since he left the hospital recently.  Reports having progressively worse dyspnea on exertion, orthopnea, and bilateral lower extremity edema.  States he is homeless and is consuming a lot of salt as he eats at [employer] and other fast food places.  He drinks a lot of water.  Reports noncompliance with home medication regimen.  States it is hard for him to take this many medications and keep track of what he is taking.  Denies any fevers, chills, chest pain, or cough.  States he last used cocaine 3 weeks ago and has not consumed any alcoholic beverages for over a year.  States he does not smoke cigarettes.  ED Course: Blood pressure 158/124, remainder of vitals stable.  SPO2 97 to 100% on room air.  No leukocytosis.  Hemoglobin 12.2, above baseline.  BNP 3804.  Troponin 0.07 (had peaked at 7.0 during recent hospitalization).  COVID-19 rapid test negative. Creatinine 1.5, stable since recent hospitalization.  UDS pending.  Chest x-ray showing cardiomegaly with mild vascular congestion.  Received IV Lasix 80 mg.  Review of Systems: As per HPI otherwise 10 point review of systems negative.  Past Medical History:  Diagnosis Date  .  Alcohol abuse   . Chronic combined systolic and diastolic CHF (congestive heart failure) ([place])    a. 09/27/18 showed mild LVH, EF 20-25%, grade 2 DD, mild MR, severely dilated LA, mildly dilated RV with mildly reduced RV function, mod RAE.  Marland Kitchen Cocaine abuse ([place])   . Hypertension     History reviewed. No pertinent surgical history.   reports that he has never smoked. He has never used smokeless tobacco. He reports current alcohol use. He reports current drug use. Frequency: 3.00 times per week. Drugs: Marijuana and Cocaine.  Allergies  Allergen Reactions  . Hydrocodone Itching    Family History  Problem Relation Age of Onset  . Hypertension Maternal Grandmother     Prior to Admission medications   Medication Sig Start Date End Date Taking? Authorizing Provider  aspirin EC 81 MG EC tablet Take 1 tablet (81 mg total) by mouth daily. 03/03/19   Cheryln Manly, NP  atorvastatin (LIPITOR) 80 MG tablet Take 1 tablet (80 mg total) by mouth daily. 03/03/19 07/01/19  Cheryln Manly, NP  carvedilol (COREG) 12.5 MG tablet Take 1 tablet (12.5 mg total) by mouth 2 (two) times daily with a meal. 03/03/19   Cheryln Manly, NP  clopidogrel (PLAVIX) 75 MG tablet Take 1 tablet (75 mg total) by mouth daily. 03/03/19   Cheryln Manly, NP  furosemide (LASIX) 40 MG tablet Take 1 tablet (40 mg total) by mouth 2 (two) times daily. 03/03/19   Cheryln Manly, NP  isosorbide-hydrALAZINE (BIDIL) 20-37.5 MG tablet Take 1 tablet by mouth 3 (  three) times daily. 11/12/18   Bonnielee Haff, MD  losartan (COZAAR) 25 MG tablet Take 1 tablet (25 mg total) by mouth daily. 03/03/19   Cheryln Manly, NP  prazosin (MINIPRESS) 1 MG capsule Take 1 capsule (1 mg total) by mouth at bedtime. 03/03/19   Cheryln Manly, NP  spironolactone (ALDACTONE) 25 MG tablet Take 0.5 tablets (12.5 mg total) by mouth daily. 03/03/19   Cheryln Manly, NP    Physical Exam: Vitals:   03/10/19 2223 03/10/19 2359 03/11/19  0000 03/11/19 0015  BP: (!) 158/124  (!) 140/106 (!) 156/110  Pulse: 97  89 87  Resp: _0 Temp: 98.4 F (36.9 C)     TempSrc: Oral     SpO2: 99%  97% 100%  Weight:  108.9 kg    Height:  5' 11" (1.803 m)      Physical Exam  Constitutional: He is oriented to person, place, and time. He appears well-developed and well-nourished. No distress.  HENT:  Head: Normocephalic.  Mouth/Throat: Oropharynx is clear and moist.  Eyes: Right eye exhibits no discharge. Left eye exhibits no discharge.  Neck: Neck supple. JVD present.  Cardiovascular: Normal rate, regular rhythm and intact distal pulses.  Pulmonary/Chest: Effort normal. He has no wheezes.  Speaking clearly in full sentences No increased work of breathing Satting well on room air Faint crackles appreciated at the bases  Abdominal: Soft. Bowel sounds are normal. He exhibits no distension. There is no abdominal tenderness. There is no guarding.  Musculoskeletal:        General: Edema present.     Comments: +1 pitting edema of bilateral lower extremities  Neurological: He is alert and oriented to person, place, and time.  Skin: Skin is warm and dry. He is not diaphoretic.     Labs on Admission: I have personally reviewed following labs and imaging studies  CBC: Recent Labs  Lab 03/10/19 2309  WBC 7.3  NEUTROABS 5.1  HGB 12.2*  HCT 37.5*  MCV 85.8  PLT 793   Basic Metabolic Panel: Recent Labs  Lab 03/10/19 2309  NA 138  K 4.2  CL 107  CO2 22  GLUCOSE 104*  BUN 26*  CREATININE 1.56*  CALCIUM 9.0   GFR: Estimated Creatinine Clearance: 67.1 mL/min (A) (by C-G formula based on SCr of 1.56 mg/dL (H)). Liver Function Tests: Recent Labs  Lab 03/10/19 2309  AST 43*  ALT 64*  ALKPHOS 100  BILITOT 0.9  PROT 6.0*  ALBUMIN 3.2*   No results for input(s): LIPASE, AMYLASE in the last 168 hours. No results for input(s): AMMONIA in the last 168 hours. Coagulation Profile: No results for input(s): INR,  PROTIME in the last 168 hours. Cardiac Enzymes: Recent Labs  Lab 03/10/19 2309  TROPONINI 0.07*   BNP (last 3 results) No results for input(s): PROBNP in the last 8760 hours. HbA1C: No results for input(s): HGBA1C in the last 72 hours. CBG: No results for input(s): GLUCAP in the last 168 hours. Lipid Profile: No results for input(s): CHOL, HDL, LDLCALC, TRIG, CHOLHDL, LDLDIRECT in the last 72 hours. Thyroid Function Tests: No results for input(s): TSH, T4TOTAL, FREET4, T3FREE, THYROIDAB in the last 72 hours. Anemia Panel: No results for input(s): VITAMINB12, FOLATE, FERRITIN, TIBC, IRON, RETICCTPCT in the last 72 hours. Urine analysis:    Component Value Date/Time   COLORURINE COLORLESS (A) 02/28/2019 1309   APPEARANCEUR CLEAR 02/28/2019 1309   LABSPEC 1.004 (L) 02/28/2019 1309  PHURINE 7.0 02/28/2019 1309   GLUCOSEU NEGATIVE 02/28/2019 1309   HGBUR NEGATIVE 02/28/2019 1309   BILIRUBINUR NEGATIVE 02/28/2019 1309   KETONESUR NEGATIVE 02/28/2019 1309   PROTEINUR NEGATIVE 02/28/2019 1309   UROBILINOGEN 0.2 03/11/2016 0934   NITRITE NEGATIVE 02/28/2019 1309   LEUKOCYTESUR NEGATIVE 02/28/2019 1309    Radiological Exams on Admission: Dg Chest 1 View  Result Date: 03/10/2019 CLINICAL DATA:  Chest pain EXAM: CHEST  1 VIEW COMPARISON:  02/28/2019 FINDINGS: Cardiomegaly, vascular congestion. No confluent opacities, effusions or edema. No acute bony abnormality. IMPRESSION: Cardiomegaly with mild vascular congestion. Electronically Signed   By: Rolm Baptise M.D.   On: 03/10/2019 23:07    EKG: Independently reviewed.  Sinus rhythm (heart rate 89), QTc 474.  Slight ST depression and T wave inversions noted in lateral leads.  No significant change since prior tracing from February 28, 2019.  Assessment/Plan Principal Problem:   Acute exacerbation of CHF (congestive heart failure) (HCC) Active Problems:   Essential hypertension   Cocaine abuse (HCC)   Chronic kidney disease, stage II  (mild)   Troponin level elevated   Acute exacerbation of chronic combined systolic and diastolic congestive heart failure Presenting with complaints of progressively worsening dyspnea on exertion, orthopnea, and bilateral lower extremity edema.  Reports medication noncompliance and dietary indiscretions including increased fluid and sodium intake.  Does have signs of volume overload on exam.  Weight at the time of discharge on April 29 was 231.2 pounds and today it is 239.6 pounds. BNP 3804.  Chest x-ray showing cardiomegaly with mild vascular congestion.  Not hypoxic.  Last echo done November 2019 with EF 20 to 25% and grade 2 diastolic dysfunction. -Cardiac monitoring -Received IV Lasix 80 mg in the ED.  Continue IV Lasix 80 mg twice daily starting in the morning. -Continue home Coreg and losartan -Intake and output, daily weights, low-sodium diet with fluid restriction -Repeat echocardiogram -Medication adherence has been a barrier.  Pharmacy consult has been placed to help patient with medication management.  Mildly elevated troponin Patient had declined cardiac catheterization during this hospitalization and was treated medically for NSTEMI.  He was started on dual antiplatelet therapy.  Troponin had peaked at 7.0 during that hospitalization.  Today troponin is 0.07.  EKG without significant change since prior hospitalization.  Patient denies any chest pain and appears comfortable on exam. -Cardiac monitoring -Continue to trend troponin -Continue aspirin, Plavix, statin, Coreg, losartan  CKD stage II Creatinine 1.5, stable since recent hospitalization.  Mild transaminitis Likely related to acutely decompensated CHF.  AST 43 and ALT 64.  Alk phos and T bili normal.  Transaminases were elevated during his recent hospitalization as well. -IV diuresis as above -Continue to monitor LFTs  Hypertension Systolic currently in 161W to 960A and diastolic in 540J to 811B. -IV Lasix as above  -Continue Coreg, isosorbide-hydralazine, prazosin, losartan  Alcohol abuse Patient states he has not consumed any alcoholic beverages for over a year.  No signs of withdrawal at this time. -Will keep on CIWA monitoring; Ativan PRN -Thiamine, folate, multivitamin  Cocaine abuse Patient states he last used cocaine 3 weeks ago. -Counseled to quit -UDS pending  Chronic normocytic anemia -Stable.  Hemoglobin 12.2, above baseline.  History of prolonged QTC QTC was prolonged during recent hospitalization.  Current EKG showing QTC 474. -Keep K >4 and mag >2  Homelessness -Social work consult  DVT prophylaxis: Lovenox Code Status: Full code Family Communication: No family available. Disposition Plan: Anticipate discharge after clinical improvement.  Consults called: None Admission status: Observation, telemetry  This chart was dictated using voice recognition software.  Despite best efforts to proofread, errors can occur which can change the documentation meaning.  Shela Leff MD [facility] Pager [phone]  If 7PM-7AM, please contact night-coverage [email or web address] Password [email or web address]  03/11/2019, 2:19 AM

## 2019-03-11 NOTE — Clinical Social Work Note (Signed)
CSW acknowledges consult regarding homelessness. Patient was given shelter resources before discharge one week ago.   RNCM note from 4/29 states: "Patient plan to transition out of hospital. Pt was provided resources for homeless shelters. MATCH was provided and the cost was overridden. TOC pharmacy will deliver medications to the bedside. No further needs from CM at this time."   CSW signing off. Please reconsult if patient is interested in more resources.  Charlynn Court, CSW [phone]

## 2019-03-11 NOTE — ED Notes (Signed)
ED TO INPATIENT HANDOFF REPORT  ED Nurse Name and Phone #: [phone], Tray [place]  S Name/Age/Gender Dale [name] 58 y.o. male Room/Bed: 027C/027C  Code Status   Code Status: Prior  Home/SNF/Other Home Patient oriented to: self, place, time and situation Is this baseline? Yes   Triage Complete: Triage complete  Chief Complaint SOB  Triage Note Per GCems, Pt from home with complaints of SHOB x 1 week, worsening over the last few days. Pt reports recent dx of CHF. Ems reports lung sounds with fine crackles in bases. Pt reports taking his medication but reports he has difficulty keeping up with when he is supposed to take them. Pt denies CP, reports weakness.    Allergies Allergies  Allergen Reactions  . Hydrocodone Itching    Level of Care/Admitting Diagnosis ED Disposition    ED Disposition Condition Comment   Admit  Hospital Area: [facility] [[id number]]  Level of Care: Telemetry Cardiac [103]  I expect the patient will be discharged within 24 hours: No (not a candidate for 5C-Observation unit)  Covid Evaluation: N/A  Diagnosis: Acute exacerbation of CHF (congestive heart failure) [facility]) [[id number]]  Admitting Physician: John Giovanni [[id number]]  Attending Physician: John Giovanni [[id number]]  PT Class (Do Not Modify): Observation [104]  PT Acc Code (Do Not Modify): Observation [10022]       B Medical/Surgery History Past Medical History:  Diagnosis Date  . Alcohol abuse   . Chronic combined systolic and diastolic CHF (congestive heart failure) (HCC)    a. 09/27/18 showed mild LVH, EF 20-25%, grade 2 DD, mild MR, severely dilated LA, mildly dilated RV with mildly reduced RV function, mod RAE.  Marland Kitchen Cocaine abuse (HCC)   . Hypertension    History reviewed. No pertinent surgical history.   A IV Location/Drains/Wounds Patient Lines/Drains/Airways Status   Active Line/Drains/Airways    Name:   Placement date:   Placement time:   Site:   Days:    Peripheral IV 03/10/19 Right Antecubital   03/10/19    2316    Antecubital   1          Intake/Output Last 24 hours No intake or output data in the 24 hours ending 03/11/19 0158  Labs/Imaging Results for orders placed or performed during the hospital encounter of 03/10/19 (from the past 48 hour(s))  CBC with Differential     Status: Abnormal   Collection Time: 03/10/19 11:09 PM  Result Value Ref Range   WBC 7.3 4.0 - 10.5 K/uL   RBC 4.37 4.22 - 5.81 MIL/uL   Hemoglobin 12.2 (L) 13.0 - 17.0 g/dL   HCT 08.6 (L) 57.8 - 46.9 %   MCV 85.8 80.0 - 100.0 fL   MCH 27.9 26.0 - 34.0 pg   MCHC 32.5 30.0 - 36.0 g/dL   RDW 62.9 52.8 - 41.3 %   Platelets 370 150 - 400 K/uL   nRBC 0.0 0.0 - 0.2 %   Neutrophils Relative % 71 %   Neutro Abs 5.1 1.7 - 7.7 K/uL   Lymphocytes Relative 19 %   Lymphs Abs 1.4 0.7 - 4.0 K/uL   Monocytes Relative 8 %   Monocytes Absolute 0.6 0.1 - 1.0 K/uL   Eosinophils Relative 0 %   Eosinophils Absolute 0.0 0.0 - 0.5 K/uL   Basophils Relative 1 %   Basophils Absolute 0.0 0.0 - 0.1 K/uL   Immature Granulocytes 1 %   Abs Immature Granulocytes 0.04 0.00 - 0.07 K/uL  Comment: Performed at [facility], [address], [place], [place] [address]  Brain natriuretic peptide     Status: Abnormal   Collection Time: 03/10/19 11:09 PM  Result Value Ref Range   B Natriuretic Peptide 3,804.7 (H) 0.0 - 100.0 pg/mL    Comment: Performed at [facility], [address], [place], [place] [address]  Troponin I - ONCE - STAT     Status: Abnormal   Collection Time: 03/10/19 11:09 PM  Result Value Ref Range   Troponin I 0.07 (HH) <0.03 ng/mL    Comment: CRITICAL RESULT CALLED TO, READ BACK BY AND VERIFIED WITH: [place] T,RN 03/11/19 0020 WAYK Performed at [facility], [address] [place], [place] [address]   SARS Coronavirus 2 (CEPHEID- Performed in [facility] hospital lab), Hosp Order     Status: None   Collection Time: 03/10/19 11:09 PM  Result  Value Ref Range   SARS Coronavirus 2 NEGATIVE NEGATIVE    Comment: (NOTE) If result is NEGATIVE SARS-CoV-2 target nucleic acids are NOT DETECTED. The SARS-CoV-2 RNA is generally detectable in upper and lower  respiratory specimens during the acute phase of infection. The lowest  concentration of SARS-CoV-2 viral copies this assay can detect is 250  copies / mL. A negative result does not preclude SARS-CoV-2 infection  and should not be used as the sole basis for treatment or other  patient management decisions.  A negative result may occur with  improper specimen collection / handling, submission of specimen other  than nasopharyngeal swab, presence of viral mutation(s) within the  areas targeted by this assay, and inadequate number of viral copies  (<250 copies / mL). A negative result must be combined with clinical  observations, patient history, and epidemiological information. If result is POSITIVE SARS-CoV-2 target nucleic acids are DETECTED. The SARS-CoV-2 RNA is generally detectable in upper and lower  respiratory specimens dur ing the acute phase of infection.  Positive  results are indicative of active infection with SARS-CoV-2.  Clinical  correlation with patient history and other diagnostic information is  necessary to determine patient infection status.  Positive results do  not rule out bacterial infection or co-infection with other viruses. If result is PRESUMPTIVE POSTIVE SARS-CoV-2 nucleic acids MAY BE PRESENT.   A presumptive positive result was obtained on the submitted specimen  and confirmed on repeat testing.  While 2019 novel coronavirus  (SARS-CoV-2) nucleic acids may be present in the submitted sample  additional confirmatory testing may be necessary for epidemiological  and / or clinical management purposes  to differentiate between  SARS-CoV-2 and other Sarbecovirus currently known to infect humans.  If clinically indicated additional testing with an  alternate test  methodology [phone]) is advised. The SARS-CoV-2 RNA is generally  detectable in upper and lower respiratory sp ecimens during the acute  phase of infection. The expected result is Negative. Fact Sheet for Patients:  BoilerBrush.com.cy Fact Sheet for Healthcare Providers: https://pope.com/ This test is not yet approved or cleared by the [place] FDA and has been authorized for detection and/or diagnosis of SARS-CoV-2 by FDA under an Emergency Use Authorization (EUA).  This EUA will remain in effect (meaning this test can be used) for the duration of the COVID-19 declaration under Section 564(b)(1) of the Act, 21 U.S.C. section 360bbb-3(b)(1), unless the authorization is terminated or revoked sooner. Performed at [facility], [address] [place], [place] [address]   Comprehensive metabolic panel  Status: Abnormal   Collection Time: 03/10/19 11:09 PM  Result Value Ref Range   Sodium 138 135 - 145 mmol/L   Potassium 4.2 3.5 - 5.1 mmol/L   [place] 107 98 - 111 mmol/L   CO2 22 22 - 32 mmol/L   Glucose, Bld 104 (H) 70 - 99 mg/dL   BUN 26 (H) 6 - 20 mg/dL   Creatinine, Ser 4.12 (H) 0.61 - 1.24 mg/dL   Calcium 9.0 8.9 - 87.8 mg/dL   Total Protein 6.0 (L) 6.5 - 8.1 g/dL   Albumin 3.2 (L) 3.5 - 5.0 g/dL   AST 43 (H) 15 - 41 U/L   ALT 64 (H) 0 - 44 U/L   Alkaline Phosphatase 100 38 - 126 U/L   Total Bilirubin 0.9 0.3 - 1.2 mg/dL   GFR calc non Af Amer 49 (L) >60 mL/min   GFR calc Af Amer 57 (L) >60 mL/min   Anion gap 9 5 - 15    Comment: Performed at [facility], [address] [place], [place] [address]   Dg Chest 1 View  Result Date: 03/10/2019 CLINICAL DATA:  Chest pain EXAM: CHEST  1 VIEW COMPARISON:  02/28/2019 FINDINGS: Cardiomegaly, vascular congestion. No confluent opacities, effusions or edema. No acute bony abnormality. IMPRESSION: Cardiomegaly with mild vascular congestion.  Electronically Signed   By: Charlett Nose M.D.   On: 03/10/2019 23:07    Pending Labs Unresulted Labs (From admission, onward)    Start     Ordered   03/11/19 0500  Basic metabolic panel  Tomorrow morning,   R     03/11/19 0158   03/11/19 0500  Troponin I - Now Then Q6H  Now then every 6 hours,   R     03/11/19 0158   03/11/19 0500  Hepatic function panel  Tomorrow morning,   R     03/11/19 0158   03/10/19 2238  Rapid urine drug screen (hospital performed)  ONCE - STAT,   R     03/10/19 2237          Vitals/Pain Today's Vitals   03/10/19 2223 03/10/19 2359 03/11/19 0000 03/11/19 0015  BP: (!) 158/124  (!) 140/106 (!) 156/110  Pulse: 97  89 87  Resp: 17  19 12   Temp: 98.4 F (36.9 C)     TempSrc: Oral     SpO2: 99%  97% 100%  Weight:  108.9 kg    Height:  5\' 11"  (1.803 m)    PainSc:        Isolation Precautions No active isolations  Medications Medications  furosemide (LASIX) injection 80 mg (has no administration in time range)  aspirin EC tablet 81 mg (has no administration in time range)  atorvastatin (LIPITOR) tablet 80 mg (has no administration in time range)  carvedilol (COREG) tablet 12.5 mg (has no administration in time range)  isosorbide-hydrALAZINE (BIDIL) 20-37.5 MG per tablet 1 tablet (has no administration in time range)  prazosin (MINIPRESS) capsule 1 mg (has no administration in time range)  clopidogrel (PLAVIX) tablet 75 mg (has no administration in time range)  enoxaparin (LOVENOX) injection 40 mg (has no administration in time range)  furosemide (LASIX) injection 80 mg (80 mg Intravenous Given 03/11/19 0100)    Mobility walks Low fall risk   Focused Assessments Pulmonary Assessment Handoff:  Lung sounds: Bilateral Breath Sounds: Fine crackles L Breath Sounds: Fine crackles R Breath Sounds: Fine crackles O2 Device: Room Air  R Recommendations: See Admitting Provider Note  Report given to:   Additional Notes:

## 2019-03-11 NOTE — TOC Initial Note (Signed)
Transition of Care [facility]) - Initial/Assessment Note    Patient Details  Name: Dale [name] MRN: [id number] Date of Birth: 02-26-1961  Transition of Care [facility]) CM/SW Contact:    Reola Mosher Phone Number: [phone] 03/11/2019, 9:06 AM  Clinical Narrative:                 03/11/2019 - Noted pt recently discharged home/ shelter; CM following for progression of care; B Shelba Flake  03/01/2019   Clinical Narrative:  Pt is without PCP- hospital follow up appointment scheduled at the [facility]. Information placed on AVS. Patient stated that he is homeless and has been living where he can. CM did provide patient with list of homeless shelters in the community. CM did ask MD to send all Rxs to the [name] pharmacy so medications can be filled and delivered to the bedside. No further needs from CM at this time.   Timoteo Ace RN,CM   Expected Discharge Plan: Home/Self Care Barriers to Discharge: No Barriers Identified   Patient Goals and CMS Choice        Expected Discharge Plan and Services Expected Discharge Plan: Home/Self Care   Discharge Planning Services: [facility] Choice: NA Living arrangements for the past 2 months: Homeless Shelter                 DME Arranged: N/A DME Agency: NA         HH Agency: NA        Prior Living Arrangements/Services Living arrangements for the past 2 months: Homeless Shelter Lives with:: Self Patient language and need for interpreter reviewed:: Yes Do you feel safe going back to the place where you live?: Yes      Need for Family Participation in Patient Care: No (Comment) Care giver support system in place?: No (comment)   Criminal Activity/Legal Involvement Pertinent to Current Situation/Hospitalization: No - Comment as needed  Activities of Daily Living Home Assistive Devices/Equipment: None ADL Screening (condition at time of admission) Patient's  cognitive ability adequate to safely complete daily activities?: Yes Is the patient deaf or have difficulty hearing?: No Does the patient have difficulty seeing, even when wearing glasses/contacts?: No Does the patient have difficulty concentrating, remembering, or making decisions?: No Patient able to express need for assistance with ADLs?: Yes Does the patient have difficulty dressing or bathing?: No Independently performs ADLs?: Yes (appropriate for developmental age) Does the patient have difficulty walking or climbing stairs?: Yes Weakness of Legs: None Weakness of Arms/Hands: None  Permission Sought/Granted                  Emotional Assessment       Orientation: : Oriented to Self, Oriented to  Time, Oriented to Place, Oriented to Situation Alcohol / Substance Use: Not Applicable Psych Involvement: No (comment)  Admission diagnosis:  History of substance abuse (HCC) [F19.11] Acute on chronic combined systolic and diastolic congestive heart failure (HCC) [I50.43] Chest pain [R07.9] Patient Active Problem List   Diagnosis Date Noted  . Acute exacerbation of CHF (congestive heart failure) (HCC) 03/11/2019  . Troponin level elevated 03/11/2019  . Hyperlipidemia 03/03/2019  . NSVT (nonsustained ventricular tachycardia) (HCC) 03/03/2019  . Anxiety and depression   . NSTEMI (non-ST elevated myocardial infarction) (HCC) 02/28/2019  . Acute systolic heart failure (HCC) 11/08/2018  . Hypertensive urgency 09/28/2018  . Acute pulmonary edema (HCC) 09/28/2018  . Cocaine abuse (HCC) 09/28/2018  . Substance  abuse (HCC) 09/28/2018  . Chronic kidney disease, stage II (mild) 09/28/2018  . Normochromic anemia 09/28/2018  . Acute systolic CHF (congestive heart failure) (HCC) 09/27/2018  . Essential hypertension 03/11/2016  . Obesity 03/11/2016  . Glucosuria 03/11/2016   PCP:  Patient, No Pcp Per Pharmacy:   [facility] - [place], [place] - [address]  [place] [address] Phone: [phone] Fax: [phone]  [facility] [place], [place] - [address] [place] [address] Phone: [phone] Fax: [phone]  [facility] [address] [place] - [address] [place] [address] Phone: [phone] Fax: [phone]  [name] [place], [place] - [address] [place] [address] Phone: [phone] Fax: [phone]     Social Determinants of Health (SDOH) Interventions    Readmission Risk Interventions Readmission Risk Prevention Plan 03/03/2019  Transportation Screening Complete  Medication Review (RN Care Manager) Complete  PCP or Specialist appointment within 3-5 days of discharge Complete  HRI or Home Care Consult (No Data)  SW Recovery Care/Counseling Consult Complete  Palliative Care Screening Not Applicable  Skilled Nursing Facility Not Applicable  Some recent data might be hidden

## 2019-03-12 ENCOUNTER — Ambulatory Visit: Payer: Self-pay | Admitting: Cardiovascular Disease

## 2019-03-12 LAB — BASIC METABOLIC PANEL
Anion gap: 12 (ref 5–15)
BUN: 25 mg/dL — ABNORMAL HIGH (ref 6–20)
CO2: 27 mmol/L (ref 22–32)
Calcium: 8.3 mg/dL — ABNORMAL LOW (ref 8.9–10.3)
Chloride: 102 mmol/L (ref 98–111)
Creatinine, Ser: 1.76 mg/dL — ABNORMAL HIGH (ref 0.61–1.24)
GFR calc Af Amer: 49 mL/min — ABNORMAL LOW (ref >60)
GFR calc non Af Amer: 43 mL/min — ABNORMAL LOW (ref >60)
Glucose, Bld: 117 mg/dL — ABNORMAL HIGH (ref 70–99)
Potassium: 3.7 mmol/L (ref 3.5–5.1)
Sodium: 141 mmol/L (ref 135–145)

## 2019-03-12 LAB — GLUCOSE, CAPILLARY: Glucose-Capillary: 92 mg/dL (ref 70–99)

## 2019-03-12 LAB — CBC
HCT: 34.9 % — ABNORMAL LOW (ref 39.0–52.0)
Hemoglobin: 11.6 g/dL — ABNORMAL LOW (ref 13.0–17.0)
MCH: 27.7 pg (ref 26.0–34.0)
MCHC: 33.2 g/dL (ref 30.0–36.0)
MCV: 83.3 fL (ref 80.0–100.0)
Platelets: 324 10*3/uL (ref 150–400)
RBC: 4.19 MIL/uL — ABNORMAL LOW (ref 4.22–5.81)
RDW: 15.2 % (ref 11.5–15.5)
WBC: 6.2 10*3/uL (ref 4.0–10.5)
nRBC: 0 % (ref 0.0–0.2)

## 2019-03-12 MED ORDER — MAGNESIUM SULFATE 2 GM/50ML IV SOLN
2.0000 g | Freq: Once | INTRAVENOUS | Status: AC
  Administered 2019-03-12: 2 g via INTRAVENOUS
  Filled 2019-03-12: qty 50

## 2019-03-12 MED ORDER — SODIUM CHLORIDE 0.9 % IV SOLN
INTRAVENOUS | Status: DC | PRN
  Administered 2019-03-12: 250 mL via INTRAVENOUS

## 2019-03-12 MED ORDER — CARVEDILOL 12.5 MG PO TABS: 12.5000 mg | ORAL_TABLET | Freq: Two times a day (BID) | ORAL | Status: DC

## 2019-03-12 MED ORDER — CARVEDILOL 12.5 MG PO TABS
12.5000 mg | ORAL_TABLET | Freq: Two times a day (BID) | ORAL | Status: DC
  Administered 2019-03-12 – 2019-03-13 (×2): 12.5 mg via ORAL
  Filled 2019-03-12 (×2): qty 1

## 2019-03-12 MED ORDER — TRAMADOL HCL 50 MG PO TABS
50.0000 mg | ORAL_TABLET | Freq: Once | ORAL | Status: DC
  Filled 2019-03-12: qty 1

## 2019-03-12 NOTE — Progress Notes (Addendum)
Per CCMD patient had 18 beats run of vtach. Upon assessment patient [place] lying in bed watching television asymptomatic. Vitals are as follows:    03/12/19 1326  Vitals  BP (!) 128/93  MAP (mmHg) 105  BP Method Automatic  Pulse Rate 88  Pulse Rate Source Monitor  Oxygen Therapy  SpO2 99 %  O2 Device Room Air  MEWS Score  MEWS RR 0  MEWS Pulse 0  MEWS Systolic 0  MEWS LOC 0  MEWS Temp 0  MEWS Score 0  MEWS Score Color Green   Monitor EKG printed and placed in chart. Regalado MD paged.

## 2019-03-12 NOTE — Progress Notes (Signed)
Blood pressure 119/70, pulse 85, temperature 98.3 F (36.8 C), temperature source Oral, resp. rate 18, height 5\' 11"  (1.803 m), weight 105.9 kg, SpO2 97 %.  Had 5BTs of Vtach. Denies Chest pain, no complains. Asymptomatic, VS wnl. Will monitor.

## 2019-03-12 NOTE — Progress Notes (Signed)
Pt c/o headache, pt very agitated and irritable, accused this RN for giving him wrong medication earlier, explained to pt what medications he got, it's his regular night meds, pt continued to accused this RN for giving him wrong medicine. Pt refused ativan, called. Clance Boll NP and ordered tramadol for pain.

## 2019-03-12 NOTE — Progress Notes (Signed)
PROGRESS NOTE    Dale [name]  GDJ:[id number] DOB: 01/08/1961 DOA: 03/10/2019 PCP: Patient, No Pcp Per    Brief Narrative: 58 year old with past medical history significant for chronic combined systolic and diastolic congestive heart failure last ejection fraction 20 to 25% and grade 2 diastolic dysfunction, hypertension, chronic kidney disease, alcohol abuse, cocaine abuse who presents complaining of shortness of breath.  Patient was recently evaluated on April 2020 for CHF and non-STEMI.  At that time he declined heart cath.  Evaluation in the ED showed chest x-ray with cardiomegaly , pulmonary vascular congestion, BNP 3804.     Assessment & Plan:   Principal Problem:   Acute exacerbation of CHF (congestive heart failure) (HCC) Active Problems:   Essential hypertension   Cocaine abuse (HCC)   Chronic kidney disease, stage II (mild)   Troponin level elevated   1-Acute on chronic systolic Heart failure exacerbation.;  -Discharge weight 03/03/2019;  231. Patient presented with worsening shortness of breath, chest x-ray with pulmonary edema, elevated BNP, mild elevation of troponin. Relate that last time that he used cocaine was 3 weeks ago. Will hold carvedilol. Continue with IV Lasix.  Patient is still complaining of shortness of breath,  not back at baseline Repeated echo showed decreased ejection fraction similar as before.  Discussed with patient about the need for heart cath as recommended last admission. He  Wants to think about it. -5 l Weight 240 pounds on admission------233.--225. Cr up. Hold diuretic today, resume lasix tomorrow po.  Repeat cr tomorrow.   2-Mild elevation of troponin: Likely in the setting of heart failure exacerbation.  3-chronic kidney disease a stage II Monitor creatinine on IV Lasix. Cr up. Hold today and repeat labs in am.   4-transaminases: Suspect related to heart failure.  5-HTN: Continue with isosorbide hydralazine and losartan.   6-alcohol abuse: No signs of withdrawal monitor on CIWA  7-cocaine abuse: Counseling provided.  Patient he relate that he used cocaine 3 weeks ago. UDS positive VT, asymptomatic. Resume carvedilol. Carvedilol was on hold due to positive uds.   Chronic normocytic anemia follow hemoglobin Hypokalemia replete with oral potassium  Estimated body mass index is 31.51 kg/m as calculated from the following:   Height as of this encounter: 5\' 11"  (1.803 m).   Weight as of this encounter: 102.5 kg.   DVT prophylaxis: Lovenox Code Status: Full code Family Communication: Care discussed with patient Disposition Plan: Patient required to stay in the hospital overnight to monitor renal function.  Being able to resume oral Lasix if renal function improved.  Also monitor on resumption of carvedilol in the setting of asymptomatic V. tach   Consultants:   None   Procedures:  Echocardiogram ejection fraction 20% diffuse wall motion abnormality   Antimicrobials:   None   Subjective: Decline hearth cath.  He will follow with cardiology for further testing.  Denies chest pain. Breathing better   Objective: Vitals:   03/12/19 0159 03/12/19 0523 03/12/19 0800 03/12/19 1326  BP: 119/70 122/86 135/90 (!) 128/93  Pulse: 85 (!) 41 84 88  Resp: 18 18 16    Temp: 98.3 F (36.8 C) 98.3 F (36.8 C) (!) 97.5 F (36.4 C)   TempSrc: Oral Oral Oral   SpO2: 97% 98% 96% 99%  Weight:  102.5 kg    Height:        Intake/Output Summary (Last 24 hours) at 03/12/2019 1611 Last data filed at 03/12/2019 1400 Gross per 24 hour  Intake 840 ml  Output 2275  ml  Net -1435 ml   Filed Weights   03/10/19 2359 03/11/19 0243 03/12/19 0523  Weight: 108.9 kg 105.9 kg 102.5 kg    Examination:  General exam: NAD Respiratory system: CTA Cardiovascular system: S 1, S 2 RRR Gastrointestinal system: BS present, soft, nt Central nervous system: non focal.  Extremities: symmetric power.    Data Reviewed: I  have personally reviewed following labs and imaging studies  CBC: Recent Labs  Lab 03/10/19 2309 03/12/19 0301  WBC 7.3 6.2  NEUTROABS 5.1  --   HGB 12.2* 11.6*  HCT 37.5* 34.9*  MCV 85.8 83.3  PLT 370 324   Basic Metabolic Panel: Recent Labs  Lab 03/10/19 2309 03/11/19 0557 03/12/19 0301  NA 138 140 141  K 4.2 3.3* 3.7  CL 107 105 102  CO2 22 23 27   GLUCOSE 104* 99 117*  BUN 26* 23* 25*  CREATININE 1.56* 1.46* 1.76*  CALCIUM 9.0 8.7* 8.3*  MG  --  1.8  --    GFR: Estimated Creatinine Clearance: 57.8 mL/min (A) (by C-G formula based on SCr of 1.76 mg/dL (H)). Liver Function Tests: Recent Labs  Lab 03/10/19 2309 03/11/19 0557  AST 43* 41  ALT 64* 66*  ALKPHOS 100 98  BILITOT 0.9 1.0  PROT 6.0* 6.7  ALBUMIN 3.2* 3.3*   No results for input(s): LIPASE, AMYLASE in the last 168 hours. No results for input(s): AMMONIA in the last 168 hours. Coagulation Profile: No results for input(s): INR, PROTIME in the last 168 hours. Cardiac Enzymes: Recent Labs  Lab 03/10/19 2309 03/11/19 0557 03/11/19 1107  TROPONINI 0.07* 0.07* 0.06*   BNP (last 3 results) No results for input(s): PROBNP in the last 8760 hours. HbA1C: No results for input(s): HGBA1C in the last 72 hours. CBG: Recent Labs  Lab 03/11/19 2114  GLUCAP 207*   Lipid Profile: No results for input(s): CHOL, HDL, LDLCALC, TRIG, CHOLHDL, LDLDIRECT in the last 72 hours. Thyroid Function Tests: No results for input(s): TSH, T4TOTAL, FREET4, T3FREE, THYROIDAB in the last 72 hours. Anemia Panel: No results for input(s): VITAMINB12, FOLATE, FERRITIN, TIBC, IRON, RETICCTPCT in the last 72 hours. Sepsis Labs: No results for input(s): PROCALCITON, LATICACIDVEN in the last 168 hours.  Recent Results (from the past 240 hour(s))  SARS Coronavirus 2 (CEPHEID- Performed in [facility] hospital lab), Hosp Order     Status: None   Collection Time: 03/10/19 11:09 PM  Result Value Ref Range Status   SARS  Coronavirus 2 NEGATIVE NEGATIVE Final    Comment: (NOTE) If result is NEGATIVE SARS-CoV-2 target nucleic acids are NOT DETECTED. The SARS-CoV-2 RNA is generally detectable in upper and lower  respiratory specimens during the acute phase of infection. The lowest  concentration of SARS-CoV-2 viral copies this assay can detect is 250  copies / mL. A negative result does not preclude SARS-CoV-2 infection  and should not be used as the sole basis for treatment or other  patient management decisions.  A negative result may occur with  improper specimen collection / handling, submission of specimen other  than nasopharyngeal swab, presence of viral mutation(s) within the  areas targeted by this assay, and inadequate number of viral copies  (<250 copies / mL). A negative result must be combined with clinical  observations, patient history, and epidemiological information. If result is POSITIVE SARS-CoV-2 target nucleic acids are DETECTED. The SARS-CoV-2 RNA is generally detectable in upper and lower  respiratory specimens dur ing the acute phase of infection.  Positive  results are indicative of active infection with SARS-CoV-2.  Clinical  correlation with patient history and other diagnostic information is  necessary to determine patient infection status.  Positive results do  not rule out bacterial infection or co-infection with other viruses. If result is PRESUMPTIVE POSTIVE SARS-CoV-2 nucleic acids MAY BE PRESENT.   A presumptive positive result was obtained on the submitted specimen  and confirmed on repeat testing.  While 2019 novel coronavirus  (SARS-CoV-2) nucleic acids may be present in the submitted sample  additional confirmatory testing may be necessary for epidemiological  and / or clinical management purposes  to differentiate between  SARS-CoV-2 and other Sarbecovirus currently known to infect humans.  If clinically indicated additional testing with an alternate test   methodology [phone]([id number]) is advised. The SARS-CoV-2 RNA is generally  detectable in upper and lower respiratory sp ecimens during the acute  phase of infection. The expected result is Negative. Fact Sheet for Patients:  BoilerBrush.com.cyhttps://www.fda.gov/media/136312/download Fact Sheet for Healthcare Providers: https://pope.com/https://www.fda.gov/media/136313/download This test is not yet approved or cleared by the [place] FDA and has been authorized for detection and/or diagnosis of SARS-CoV-2 by FDA under an Emergency Use Authorization (EUA).  This EUA will remain in effect (meaning this test can be used) for the duration of the COVID-19 declaration under Section 564(b)(1) of the Act, 21 U.S.C. section 360bbb-3(b)(1), unless the authorization is terminated or revoked sooner. Performed at [facility], [address] [place], [place] [address]          Radiology Studies: Dg Chest 1 View  Result Date: 03/10/2019 CLINICAL DATA:  Chest pain EXAM: CHEST  1 VIEW COMPARISON:  02/28/2019 FINDINGS: Cardiomegaly, vascular congestion. No confluent opacities, effusions or edema. No acute bony abnormality. IMPRESSION: Cardiomegaly with mild vascular congestion. Electronically Signed   By: Charlett NoseKevin  Dover M.D.   On: 03/10/2019 23:07        Scheduled Meds: . aspirin EC  81 mg Oral Daily  . atorvastatin  80 mg Oral q1800  . carvedilol  12.5 mg Oral BID WC  . clopidogrel  75 mg Oral Daily  . enoxaparin (LOVENOX) injection  40 mg Subcutaneous Q24H  . folic acid  1 mg Oral Daily  . isosorbide-hydrALAZINE  1 tablet Oral TID  . multivitamin with minerals  1 tablet Oral Daily  . prazosin  1 mg Oral QHS  . thiamine  100 mg Oral Daily   Or  . thiamine  100 mg Intravenous Daily   Continuous Infusions: . sodium chloride 250 mL (03/12/19 0943)     LOS: 1 day    Time spent: 35 minutes.    Alba CoryBelkys A Mayetta Castleman, MD [facility] Pager [phone]  If 7PM-7AM, please contact night-coverage  [email or web address] Password [email or web address] 03/12/2019, 4:11 PM

## 2019-03-12 NOTE — Progress Notes (Signed)
Pt refused to take tramadol, continued to accused this RN of giving him wrong medicine, another RN went to his room to give this med but pt refused to take.

## 2019-03-13 LAB — BASIC METABOLIC PANEL
Anion gap: 6 (ref 5–15)
BUN: 24 mg/dL — ABNORMAL HIGH (ref 6–20)
CO2: 25 mmol/L (ref 22–32)
Calcium: 8.6 mg/dL — ABNORMAL LOW (ref 8.9–10.3)
Chloride: 108 mmol/L (ref 98–111)
Creatinine, Ser: 1.36 mg/dL — ABNORMAL HIGH (ref 0.61–1.24)
GFR calc Af Amer: >60 mL/min (ref >60)
GFR calc non Af Amer: 58 mL/min — ABNORMAL LOW (ref >60)
Glucose, Bld: 117 mg/dL — ABNORMAL HIGH (ref 70–99)
Potassium: 3.9 mmol/L (ref 3.5–5.1)
Sodium: 139 mmol/L (ref 135–145)

## 2019-03-13 MED ORDER — FUROSEMIDE 40 MG PO TABS: 40.0000 mg | ORAL_TABLET | Freq: Two times a day (BID) | ORAL | Status: DC

## 2019-03-13 MED ORDER — ISOSORB DINITRATE-HYDRALAZINE 20-37.5 MG PO TABS: 1.0000 | ORAL_TABLET | Freq: Three times a day (TID) | ORAL | 1 refills | Status: DC

## 2019-03-13 MED ORDER — SPIRONOLACTONE 25 MG PO TABS: 12.5000 mg | ORAL_TABLET | Freq: Every day | ORAL | 3 refills | Status: DC

## 2019-03-13 MED ORDER — LOSARTAN POTASSIUM 25 MG PO TABS: 25.0000 mg | ORAL_TABLET | Freq: Every day | ORAL | 0 refills | Status: DC

## 2019-03-13 MED ORDER — CARVEDILOL 12.5 MG PO TABS: 12.5000 mg | ORAL_TABLET | Freq: Two times a day (BID) | ORAL | 3 refills | Status: DC

## 2019-03-13 MED ORDER — FUROSEMIDE 40 MG PO TABS: 40.0000 mg | ORAL_TABLET | Freq: Two times a day (BID) | ORAL | 2 refills | Status: DC

## 2019-03-13 NOTE — Progress Notes (Signed)
Discharge packet with medication education given to patient with teach back. Peripheral iv removed, clean dry and intact, pressure and dressing applied.  Medications were picked up from pharmacy, form:signed and place in pts chart, copy given to patient. Patient's questions and concerns were answered. Patient was transported to emergency department entrance where patient was to wait for uber to arrive at 1500. Patients belongings with  Pt: cell phone,medications,clothes,jacket and shoes.

## 2019-03-13 NOTE — Progress Notes (Signed)
Per [facility] Lab tech patient refused to get his lab work this morning.  I called lab tech for a second try after talking to patient on importance of getting this  Lab results. Patient stated: "this lab person keeps coming when I am doing something"  Patient was educated on importance of lab work second time. Patient stated :"call her to come on then".  [facility] lab tech started working on getting lab work for another patient in the unit, I spoke with her and let her know that patient had agreed to get the blood work. [facility] Lab tech will go to patients room as soon as she finishes with her current pt. Patient called my phone stating that wants the lab work done now.  Patient hanged up the phone before I could explain the current situation. Charge nurse aware.

## 2019-03-13 NOTE — Discharge Summary (Signed)
Physician Discharge Summary  Dale [name] BTD:[id number] DOB: Jun 28, 1961 DOA: 03/10/2019  PCP: Patient, No Pcp Per  Admit date: 03/10/2019 Discharge date: 03/13/2019  Admitted From: Home  Disposition:  Home   Recommendations for Outpatient Follow-up:  1. Follow up with PCP in 1-2 weeks 2. Please obtain BMP/CBC in one week 3. Needs B-met to monitor renal function on resumption of ACE 4. Follow up with cardiology to determine next test evaluation for CAD 5. FU LFT   Discharge Condition: stable.  CODE STATUS: full code Diet recommendation: Heart Healthy   Brief/Interim Summary: 58 year old with past medical history significant for chronic combined systolic and diastolic congestive heart failure last ejection fraction 20 to 25% and grade 2 diastolic dysfunction, hypertension, chronic kidney disease, alcohol abuse, cocaine abuse who presents complaining of shortness of breath.  Patient was recently evaluated on April 2020 for CHF and non-STEMI.  At that time he declined heart cath.  Evaluation in the ED showed chest x-ray with cardiomegaly , pulmonary vascular congestion, BNP 3804.   1-Acute on chronic systolic Heart failure exacerbation.;  -Discharge weight 03/03/2019;  231. Patient presented with worsening shortness of breath, chest x-ray with pulmonary edema, elevated BNP, mild elevation of troponin. Relate that last time that he used cocaine was 3 weeks ago. Will hold carvedilol. Continue with IV Lasix.  Patient is still complaining of shortness of breath,  not back at baseline Repeated echo showed decreased ejection fraction similar as before.  Discussed with patient about the need for heart cath as recommended last admission. He  Wants to think about it. -5 l Weight 240 pounds on admission------233.--225.--228 Feeling better, denies dyspnea. Will resume lasix today 40 mg BID. Resume cozaar. Cr decreased to 1. 3   2-Mild elevation of troponin: Likely in the setting of heart failure  exacerbation.  3-chronic kidney disease a stage II Cr down to 1.3 . Resume lasix 40 mg BID  4-transaminases: Suspect related to heart failure.  5-HTN: Continue with isosorbide hydralazine and resume  losartan.  6-alcohol abuse: No signs of withdrawal monitor on CIWA  7-cocaine abuse: Counseling provided.  Patient he relate that he used cocaine 3 weeks ago. UDS positive VT, asymptomatic. Resume carvedilol. Carvedilol was on hold due to positive uds.   Chronic normocytic anemia follow hemoglobin Hypokalemia replete with oral potassium   Discharge Diagnoses:  Principal Problem:   Acute exacerbation of CHF (congestive heart failure) (HCC) Active Problems:   Essential hypertension   Cocaine abuse (HCC)   Chronic kidney disease, stage II (mild)   Troponin level elevated    Discharge Instructions  Discharge Instructions    Diet - low sodium heart healthy   Complete by:  As directed    Increase activity slowly   Complete by:  As directed      Allergies as of 03/13/2019      Reactions   Hydrocodone Itching      Medication List    TAKE these medications   aspirin 81 MG EC tablet Take 1 tablet (81 mg total) by mouth daily.   atorvastatin 80 MG tablet Commonly known as:  Lipitor Take 1 tablet (80 mg total) by mouth daily.   carvedilol 12.5 MG tablet Commonly known as:  COREG Take 1 tablet (12.5 mg total) by mouth 2 (two) times daily with a meal.   clopidogrel 75 MG tablet Commonly known as:  PLAVIX Take 1 tablet (75 mg total) by mouth daily.   furosemide 40 MG tablet Commonly known as:  LASIX  Take 1 tablet (40 mg total) by mouth 2 (two) times daily.   isosorbide-hydrALAZINE 20-37.5 MG tablet Commonly known as:  BIDIL Take 1 tablet by mouth 3 (three) times daily.   losartan 25 MG tablet Commonly known as:  COZAAR Take 1 tablet (25 mg total) by mouth daily.   prazosin 1 MG capsule Commonly known as:  MINIPRESS Take 1 capsule (1 mg total) by mouth at  bedtime.   spironolactone 25 MG tablet Commonly known as:  ALDACTONE Take 0.5 tablets (12.5 mg total) by mouth daily.      Follow-up Information    Nahser, Wonda Cheng, MD Follow up in 1 week(s).   Specialty:  Cardiology Contact information: [place] [address] [place] [address] [phone]          Allergies  Allergen Reactions  . Hydrocodone Itching    Consultations:  none   Procedures/Studies: Dg Chest 1 View  Result Date: 03/10/2019 CLINICAL DATA:  Chest pain EXAM: CHEST  1 VIEW COMPARISON:  02/28/2019 FINDINGS: Cardiomegaly, vascular congestion. No confluent opacities, effusions or edema. No acute bony abnormality. IMPRESSION: Cardiomegaly with mild vascular congestion. Electronically Signed   By: Rolm Baptise M.D.   On: 03/10/2019 23:07   Dg Chest 2 View  Result Date: 02/28/2019 CLINICAL DATA:  Shortness of breath.  Cough. EXAM: CHEST - 2 VIEW COMPARISON:  December 25, 2018 FINDINGS: Cardiomegaly. Hila, mediastinum, lungs, and pleura are otherwise normal. IMPRESSION: Cardiomegaly.  No other abnormalities. Electronically Signed   By: Dorise Bullion III M.D   On: 02/28/2019 12:01       Subjective: Denies dyspnea. Denies chest pain, decline cath.   Discharge Exam: Vitals:   03/13/19 0822 03/13/19 1212  BP: 133/90 128/89  Pulse: 86 84  Resp:  19  Temp:  (!) 97.4 F (36.3 C)  SpO2: 95% 96%     General: Pt is alert, awake, not in acute distress Cardiovascular: RRR, S1/S2 +, no rubs, no gallops Respiratory: CTA bilaterally, no wheezing, no rhonchi Abdominal: Soft, NT, ND, bowel sounds + Extremities: no edema, no cyanosis    The results of significant diagnostics from this hospitalization (including imaging, microbiology, ancillary and laboratory) are listed below for reference.     Microbiology: Recent Results (from the past 240 hour(s))  SARS Coronavirus 2 (CEPHEID- Performed in [place] hospital lab), Hosp Order     Status: None    Collection Time: 03/10/19 11:09 PM  Result Value Ref Range Status   SARS Coronavirus 2 NEGATIVE NEGATIVE Final    Comment: (NOTE) If result is NEGATIVE SARS-CoV-2 target nucleic acids are NOT DETECTED. The SARS-CoV-2 RNA is generally detectable in upper and lower  respiratory specimens during the acute phase of infection. The lowest  concentration of SARS-CoV-2 viral copies this assay can detect is 250  copies / mL. A negative result does not preclude SARS-CoV-2 infection  and should not be used as the sole basis for treatment or other  patient management decisions.  A negative result may occur with  improper specimen collection / handling, submission of specimen other  than nasopharyngeal swab, presence of viral mutation(s) within the  areas targeted by this assay, and inadequate number of viral copies  (<250 copies / mL). A negative result must be combined with clinical  observations, patient history, and epidemiological information. If result is POSITIVE SARS-CoV-2 target nucleic acids are DETECTED. The SARS-CoV-2 RNA is generally detectable in upper and lower  respiratory specimens dur ing the acute phase  of infection.  Positive  results are indicative of active infection with SARS-CoV-2.  Clinical  correlation with patient history and other diagnostic information is  necessary to determine patient infection status.  Positive results do  not rule out bacterial infection or co-infection with other viruses. If result is PRESUMPTIVE POSTIVE SARS-CoV-2 nucleic acids MAY BE PRESENT.   A presumptive positive result was obtained on the submitted specimen  and confirmed on repeat testing.  While 2019 novel coronavirus  (SARS-CoV-2) nucleic acids may be present in the submitted sample  additional confirmatory testing may be necessary for epidemiological  and / or clinical management purposes  to differentiate between  SARS-CoV-2 and other Sarbecovirus currently known to infect humans.   If clinically indicated additional testing with an alternate test  methodology [phone]) is advised. The SARS-CoV-2 RNA is generally  detectable in upper and lower respiratory sp ecimens during the acute  phase of infection. The expected result is Negative. Fact Sheet for Patients:  StrictlyIdeas.no Fact Sheet for Healthcare Providers: BankingDealers.co.za This test is not yet approved or cleared by the [place] FDA and has been authorized for detection and/or diagnosis of SARS-CoV-2 by FDA under an Emergency Use Authorization (EUA).  This EUA will remain in effect (meaning this test can be used) for the duration of the COVID-19 declaration under Section 564(b)(1) of the Act, 21 U.S.C. section 360bbb-3(b)(1), unless the authorization is terminated or revoked sooner. Performed at [facility], [place] [address] [place], [place] [address]      Labs: BNP (last 3 results) Recent Labs    11/07/18 2234 02/28/19 0954 03/10/19 2309  BNP 2,330.9* 2,106.0* 7,948.0*   Basic Metabolic Panel: Recent Labs  Lab 03/10/19 2309 03/11/19 0557 03/12/19 0301 03/13/19 1206  NA 138 140 141 139  K 4.2 3.3* 3.7 3.9  CL 107 105 102 108  CO2 22 23 27 25   GLUCOSE 104* 99 117* 117*  BUN 26* 23* 25* 24*  CREATININE 1.56* 1.46* 1.76* 1.36*  CALCIUM 9.0 8.7* 8.3* 8.6*  MG  --  1.8  --   --    Liver Function Tests: Recent Labs  Lab 03/10/19 2309 03/11/19 0557  AST 43* 41  ALT 64* 66*  ALKPHOS 100 98  BILITOT 0.9 1.0  PROT 6.0* 6.7  ALBUMIN 3.2* 3.3*   No results for input(s): LIPASE, AMYLASE in the last 168 hours. No results for input(s): AMMONIA in the last 168 hours. CBC: Recent Labs  Lab 03/10/19 2309 03/12/19 0301  WBC 7.3 6.2  NEUTROABS 5.1  --   HGB 12.2* 11.6*  HCT 37.5* 34.9*  MCV 85.8 83.3  PLT 370 324   Cardiac Enzymes: Recent Labs  Lab 03/10/19 2309 03/11/19 0557 03/11/19 1107  TROPONINI 0.07* 0.07*  0.06*   BNP: Invalid input(s): POCBNP CBG: Recent Labs  Lab 03/11/19 2114 03/12/19 2113  GLUCAP 207* 92   D-Dimer No results for input(s): DDIMER in the last 72 hours. Hgb A1c No results for input(s): HGBA1C in the last 72 hours. Lipid Profile No results for input(s): CHOL, HDL, LDLCALC, TRIG, CHOLHDL, LDLDIRECT in the last 72 hours. Thyroid function studies No results for input(s): TSH, T4TOTAL, T3FREE, THYROIDAB in the last 72 hours.  Invalid input(s): FREET3 Anemia work up No results for input(s): VITAMINB12, FOLATE, FERRITIN, TIBC, IRON, RETICCTPCT in the last 72 hours. Urinalysis    Component Value Date/Time   COLORURINE COLORLESS (A) 02/28/2019 1309   APPEARANCEUR CLEAR 02/28/2019 1309   LABSPEC 1.004 (L)  02/28/2019 1309   PHURINE 7.0 02/28/2019 1309   GLUCOSEU NEGATIVE 02/28/2019 1309   HGBUR NEGATIVE 02/28/2019 1309   BILIRUBINUR NEGATIVE 02/28/2019 1309   KETONESUR NEGATIVE 02/28/2019 1309   PROTEINUR NEGATIVE 02/28/2019 1309   UROBILINOGEN 0.2 03/11/2016 0934   NITRITE NEGATIVE 02/28/2019 1309   LEUKOCYTESUR NEGATIVE 02/28/2019 1309   Sepsis Labs Invalid input(s): PROCALCITONIN,  WBC,  LACTICIDVEN Microbiology Recent Results (from the past 240 hour(s))  SARS Coronavirus 2 (CEPHEID- Performed in [place] hospital lab), Hosp Order     Status: None   Collection Time: 03/10/19 11:09 PM  Result Value Ref Range Status   SARS Coronavirus 2 NEGATIVE NEGATIVE Final    Comment: (NOTE) If result is NEGATIVE SARS-CoV-2 target nucleic acids are NOT DETECTED. The SARS-CoV-2 RNA is generally detectable in upper and lower  respiratory specimens during the acute phase of infection. The lowest  concentration of SARS-CoV-2 viral copies this assay can detect is 250  copies / mL. A negative result does not preclude SARS-CoV-2 infection  and should not be used as the sole basis for treatment or other  patient management decisions.  A negative result may occur with   improper specimen collection / handling, submission of specimen other  than nasopharyngeal swab, presence of viral mutation(s) within the  areas targeted by this assay, and inadequate number of viral copies  (<250 copies / mL). A negative result must be combined with clinical  observations, patient history, and epidemiological information. If result is POSITIVE SARS-CoV-2 target nucleic acids are DETECTED. The SARS-CoV-2 RNA is generally detectable in upper and lower  respiratory specimens dur ing the acute phase of infection.  Positive  results are indicative of active infection with SARS-CoV-2.  Clinical  correlation with patient history and other diagnostic information is  necessary to determine patient infection status.  Positive results do  not rule out bacterial infection or co-infection with other viruses. If result is PRESUMPTIVE POSTIVE SARS-CoV-2 nucleic acids MAY BE PRESENT.   A presumptive positive result was obtained on the submitted specimen  and confirmed on repeat testing.  While 2019 novel coronavirus  (SARS-CoV-2) nucleic acids may be present in the submitted sample  additional confirmatory testing may be necessary for epidemiological  and / or clinical management purposes  to differentiate between  SARS-CoV-2 and other Sarbecovirus currently known to infect humans.  If clinically indicated additional testing with an alternate test  methodology [phone]) is advised. The SARS-CoV-2 RNA is generally  detectable in upper and lower respiratory sp ecimens during the acute  phase of infection. The expected result is Negative. Fact Sheet for Patients:  StrictlyIdeas.no Fact Sheet for Healthcare Providers: BankingDealers.co.za This test is not yet approved or cleared by the [place] FDA and has been authorized for detection and/or diagnosis of SARS-CoV-2 by FDA under an Emergency Use Authorization (EUA).  This EUA will  remain in effect (meaning this test can be used) for the duration of the COVID-19 declaration under Section 564(b)(1) of the Act, 21 U.S.C. section 360bbb-3(b)(1), unless the authorization is terminated or revoked sooner. Performed at [facility], [place] [address], [place], [place] [address]      Time coordinating discharge: 40 minutes  SIGNED:   Elmarie Shiley, MD  [facility]

## 2019-03-15 ENCOUNTER — Telehealth: Payer: Self-pay | Admitting: Cardiovascular Disease

## 2019-03-15 NOTE — Telephone Encounter (Signed)
New message:    Patient need a follow up appt. Please call patient back.

## 2019-03-15 NOTE — Telephone Encounter (Signed)
S/w pt is aware of appt on 5/14 in person with Dr.Nahser per Lawson Fiscal.  Went over all instructions for coming to the office.  Pt know to wear mask.

## 2019-03-18 ENCOUNTER — Ambulatory Visit (INDEPENDENT_AMBULATORY_CARE_PROVIDER_SITE_OTHER): Payer: Self-pay | Admitting: Cardiovascular Disease

## 2019-03-18 ENCOUNTER — Other Ambulatory Visit: Payer: Self-pay

## 2019-03-18 ENCOUNTER — Encounter: Payer: Self-pay | Admitting: Cardiovascular Disease

## 2019-03-18 VITALS — BP 124/90 | HR 79 | Ht 71.0 in | Wt 239.8 lb

## 2019-03-18 DIAGNOSIS — I110 Hypertensive heart disease with heart failure: Secondary | ICD-10-CM

## 2019-03-18 DIAGNOSIS — I5022 Chronic systolic (congestive) heart failure: Secondary | ICD-10-CM

## 2019-03-18 NOTE — Progress Notes (Signed)
Cardiology Office Note:    Date:  03/18/2019   ID:  Dale [name], DOB [birth year], MRN [id number]  PCP:  Patient, No Pcp Per  Cardiologist:  Kristeen Miss, MD  Electrophysiologist:  None   Referring MD: No ref. provider found   Chief Complaint  Patient presents with  . Congestive Heart Failure     Mar 18, 2019    Dale [name] is a 58 y.o. male with a hx of CAD , hypertension, acute systolic congestive heart failure, cocaine abuse, chronic kidney disease, hyperlipidemia.  He was admitted in early/mid April with acute on chronic combined systolic heart failure.  Was found to be markedly hypertensive.  He was diuresed.  He has continued cocaine use he has heavy alcohol use.  Echocardiogram shows ejection fraction of 25% He was offered heart catheterization but the patient declined to have the procedure.  He was continued on aspirin and Plavix.  Feels weak, doing ok as long as he contnues his med.    Past Medical History:  Diagnosis Date  . Alcohol abuse   . Chronic combined systolic and diastolic CHF (congestive heart failure) (HCC)    a. 09/27/18 showed mild LVH, EF 20-25%, grade 2 DD, mild MR, severely dilated LA, mildly dilated RV with mildly reduced RV function, mod RAE.  Marland Kitchen Cocaine abuse (HCC)   . Hypertension     History reviewed. No pertinent surgical history.  Current Medications: Current Meds  Medication Sig  . aspirin EC 81 MG EC tablet Take 1 tablet (81 mg total) by mouth daily.  Marland Kitchen atorvastatin (LIPITOR) 80 MG tablet Take 1 tablet (80 mg total) by mouth daily.  . carvedilol (COREG) 12.5 MG tablet Take 1 tablet (12.5 mg total) by mouth 2 (two) times daily with a meal.  . clopidogrel (PLAVIX) 75 MG tablet Take 1 tablet (75 mg total) by mouth daily.  . furosemide (LASIX) 40 MG tablet Take 1 tablet (40 mg total) by mouth 2 (two) times daily.  . isosorbide-hydrALAZINE (BIDIL) 20-37.5 MG tablet Take 1 tablet by mouth 3 (three) times daily.  Marland Kitchen losartan (COZAAR) 25 MG  tablet Take 1 tablet (25 mg total) by mouth daily.  . prazosin (MINIPRESS) 1 MG capsule Take 1 capsule (1 mg total) by mouth at bedtime.  Marland Kitchen spironolactone (ALDACTONE) 25 MG tablet Take 25 mg by mouth daily.     Allergies:   Hydrocodone   Social History   Socioeconomic History  . Marital status: Single    Spouse name: Not on file  . Number of children: Not on file  . Years of education: Not on file  . Highest education level: Not on file  Occupational History  . Not on file  Social Needs  . Financial resource strain: Not on file  . Food insecurity:    Worry: Not on file    Inability: Not on file  . Transportation needs:    Medical: Not on file    Non-medical: Not on file  Tobacco Use  . Smoking status: Never Smoker  . Smokeless tobacco: Never Used  Substance and Sexual Activity  . Alcohol use: Yes    Comment: 2 quarts a week   . Drug use: Yes    Frequency: 3.0 times per week    Types: Marijuana, Cocaine  . Sexual activity: Not Currently  Lifestyle  . Physical activity:    Days per week: Not on file    Minutes per session: Not on file  . Stress: Not on file  Relationships  . Social connections:    Talks on phone: Not on file    Gets together: Not on file    Attends religious service: Not on file    Active member of club or organization: Not on file    Attends meetings of clubs or organizations: Not on file    Relationship status: Not on file  Other Topics Concern  . Not on file  Social History Narrative  . Not on file     Family History: The patient's family history includes Hypertension in his maternal grandmother.  ROS:   Please see the history of present illness.     All other systems reviewed and are negative.  EKGs/Labs/Other Studies Reviewed:    The following studies were reviewed today:   EKG:     Recent Labs: 09/27/2018: TSH 1.265 03/10/2019: B Natriuretic Peptide 3,804.7 03/11/2019: ALT 66; Magnesium 1.8 03/12/2019: Hemoglobin 11.6; Platelets  324 03/13/2019: BUN 24; Creatinine, Ser 1.36; Potassium 3.9; Sodium 139  Recent Lipid Panel    Component Value Date/Time   CHOL 161 09/27/2018 0700   TRIG 25 09/27/2018 0700   HDL 55 09/27/2018 0700   CHOLHDL 2.9 09/27/2018 0700   VLDL 5 09/27/2018 0700   LDLCALC 101 (H) 09/27/2018 0700    Physical Exam:    VS:  BP 124/90   Pulse 79   Ht 5\' 11"  (1.803 m)   Wt 239 lb 12.8 oz (108.8 kg)   SpO2 97%   BMI 33.45 kg/m     Wt Readings from Last 3 Encounters:  03/18/19 239 lb 12.8 oz (108.8 kg)  03/13/19 228 lb 11.2 oz (103.7 kg)  03/03/19 231 lb 12.8 oz (105.1 kg)     GEN:    Chronically ill appearing man.  NAD  HEENT: Normal NECK: No JVD; No carotid bruits LYMPHATICS: No lymphadenopathy CARDIAC: RRR, no murmurs, rubs, gallops RESPIRATORY:  Clear to auscultation without rales, wheezing or rhonchi  ABDOMEN: Soft, non-tender, non-distended MUSCULOSKELETAL:  No edema; No deformity  SKIN: Warm and dry NEUROLOGIC:  Alert and oriented x 3 PSYCHIATRIC:  Normal affect   ASSESSMENT:    1. Chronic systolic heart failure (HCC)    PLAN:    In order of problems listed above:  1. Chronic combined CHF:  On good medical therapy.   BP is low normal .   contnue meds .   Needs a cath   2. CAD:  S/p NSTEMI- while using cocain.   Discussed cath.   He does not want to have a cath yet.   Explained risks, benefits, options.   He said he needs to think about it  3.  Cocaine abuse:  Advised complete cessation  4. Essential HTN:  BP is well contolled.    Medication Adjustments/Labs and Tests Ordered: Current medicines are reviewed at length with the patient today.  Concerns regarding medicines are outlined above.  Orders Placed This Encounter  Procedures  . Basic Metabolic Panel (BMET)   No orders of the defined types were placed in this encounter.   Patient Instructions  Medication Instructions:  Your physician recommends that you continue on your current medications as directed.  Please refer to the Current Medication list given to you today.  If you need a refill on your cardiac medications before your next appointment, please call your pharmacy.   Lab work: TODAY  - basic metabolic panel  If you have labs (blood work) drawn today and your tests are completely normal, you will  receive your results only by: Marland Kitchen MyChart Message (if you have MyChart) OR . A paper copy in the mail If you have any lab test that is abnormal or we need to change your treatment, we will call you to review the results.    Testing/Procedures: None Ordered    Follow-Up: Your physician recommends that you are scheduled for a follow-up VIRTUAL appointment on Friday July 10 at 8:15 am with Tereso Newcomer, PA Please call our office to reschedule if needed     Signed, Kristeen Miss, MD  03/18/2019 2:06 PM    [facility]

## 2019-03-18 NOTE — Patient Instructions (Addendum)
Medication Instructions:  Your physician recommends that you continue on your current medications as directed. Please refer to the Current Medication list given to you today.  If you need a refill on your cardiac medications before your next appointment, please call your pharmacy.   Lab work: TODAY  - basic metabolic panel  If you have labs (blood work) drawn today and your tests are completely normal, you will receive your results only by: Marland Kitchen MyChart Message (if you have MyChart) OR . A paper copy in the mail If you have any lab test that is abnormal or we need to change your treatment, we will call you to review the results.    Testing/Procedures: None Ordered    Follow-Up: Your physician recommends that you are scheduled for a follow-up VIRTUAL appointment on Friday July 10 at 8:15 am with Tereso Newcomer, PA Please call our office to reschedule if needed

## 2019-03-19 LAB — BASIC METABOLIC PANEL
BUN/Creatinine Ratio: 24 — ABNORMAL HIGH (ref 9–20)
BUN: 26 mg/dL — ABNORMAL HIGH (ref 6–24)
CO2: 22 mmol/L (ref 20–29)
Calcium: 8.6 mg/dL — ABNORMAL LOW (ref 8.7–10.2)
Chloride: 105 mmol/L (ref 96–106)
Creatinine, Ser: 1.1 mg/dL (ref 0.76–1.27)
GFR calc Af Amer: 87 mL/min/{1.73_m2} (ref >59)
GFR calc non Af Amer: 75 mL/min/{1.73_m2} (ref >59)
Glucose: 85 mg/dL (ref 65–99)
Potassium: 3.9 mmol/L (ref 3.5–5.2)
Sodium: 141 mmol/L (ref 134–144)

## 2019-03-27 ENCOUNTER — Encounter (HOSPITAL_COMMUNITY): Payer: Self-pay | Admitting: Emergency Medicine

## 2019-03-27 ENCOUNTER — Emergency Department (HOSPITAL_COMMUNITY): Payer: Medicaid Other

## 2019-03-27 ENCOUNTER — Other Ambulatory Visit: Payer: Self-pay

## 2019-03-27 ENCOUNTER — Emergency Department (HOSPITAL_COMMUNITY)
Admission: EM | Admit: 2019-03-27 | Discharge: 2019-03-27 | Disposition: A | Discharge status: Home / Self Care | Payer: Medicaid Other | Attending: Emergency Medicine | Admitting: Emergency Medicine

## 2019-03-27 DIAGNOSIS — Z79899 Other long term (current) drug therapy: Secondary | ICD-10-CM | POA: Insufficient documentation

## 2019-03-27 DIAGNOSIS — I130 Hypertensive heart and chronic kidney disease with heart failure and stage 1 through stage 4 chronic kidney disease, or unspecified chronic kidney disease: Secondary | ICD-10-CM | POA: Insufficient documentation

## 2019-03-27 DIAGNOSIS — Z7982 Long term (current) use of aspirin: Secondary | ICD-10-CM | POA: Insufficient documentation

## 2019-03-27 DIAGNOSIS — R0600 Dyspnea, unspecified: Secondary | ICD-10-CM

## 2019-03-27 DIAGNOSIS — N182 Chronic kidney disease, stage 2 (mild): Secondary | ICD-10-CM | POA: Diagnosis not present

## 2019-03-27 DIAGNOSIS — Z8679 Personal history of other diseases of the circulatory system: Secondary | ICD-10-CM

## 2019-03-27 DIAGNOSIS — I5042 Chronic combined systolic (congestive) and diastolic (congestive) heart failure: Secondary | ICD-10-CM | POA: Insufficient documentation

## 2019-03-27 LAB — COMPREHENSIVE METABOLIC PANEL
ALT: 39 U/L (ref 0–44)
AST: 26 U/L (ref 15–41)
Albumin: 3.3 g/dL — ABNORMAL LOW (ref 3.5–5.0)
Alkaline Phosphatase: 105 U/L (ref 38–126)
Anion gap: 11 (ref 5–15)
BUN: 21 mg/dL — ABNORMAL HIGH (ref 6–20)
CO2: 20 mmol/L — ABNORMAL LOW (ref 22–32)
Calcium: 8.5 mg/dL — ABNORMAL LOW (ref 8.9–10.3)
Chloride: 105 mmol/L (ref 98–111)
Creatinine, Ser: 1.19 mg/dL (ref 0.61–1.24)
GFR calc Af Amer: >60 mL/min (ref >60)
GFR calc non Af Amer: >60 mL/min (ref >60)
Glucose, Bld: 87 mg/dL (ref 70–99)
Potassium: 3.9 mmol/L (ref 3.5–5.1)
Sodium: 136 mmol/L (ref 135–145)
Total Bilirubin: 1.2 mg/dL (ref 0.3–1.2)
Total Protein: 6.7 g/dL (ref 6.5–8.1)

## 2019-03-27 LAB — TROPONIN I: Troponin I: 0.04 ng/mL (ref <0.03)

## 2019-03-27 LAB — CBC
HCT: 35.1 % — ABNORMAL LOW (ref 39.0–52.0)
Hemoglobin: 11.4 g/dL — ABNORMAL LOW (ref 13.0–17.0)
MCH: 27.9 pg (ref 26.0–34.0)
MCHC: 32.5 g/dL (ref 30.0–36.0)
MCV: 86 fL (ref 80.0–100.0)
Platelets: 303 10*3/uL (ref 150–400)
RBC: 4.08 MIL/uL — ABNORMAL LOW (ref 4.22–5.81)
RDW: 15.6 % — ABNORMAL HIGH (ref 11.5–15.5)
WBC: 7.4 10*3/uL (ref 4.0–10.5)
nRBC: 0 % (ref 0.0–0.2)

## 2019-03-27 MED ORDER — FUROSEMIDE 10 MG/ML IJ SOLN
40.0000 mg | Freq: Once | INTRAMUSCULAR | Status: AC
  Administered 2019-03-27: 12:00:00 40 mg via INTRAVENOUS
  Filled 2019-03-27: qty 4

## 2019-03-27 NOTE — ED Notes (Signed)
Patient verbalizes understanding of discharge instructions. Opportunity for questioning and answers were provided. Armband removed by staff, pt discharged from ED.  

## 2019-03-27 NOTE — ED Notes (Signed)
Pt given breakfast tray- resting without complaint. States he is living in a hotel/motel with a friend- "just stay wherever I can" - but denies any covid exposure. Still refuses to be retested at this time

## 2019-03-27 NOTE — ED Notes (Signed)
Pt refused covid 19 test -- had negative test on 03/10/2019.

## 2019-03-27 NOTE — ED Provider Notes (Signed)
[facility] EMERGENCY DEPARTMENT Provider Note   CSN: [id number] Arrival date & time: 03/27/19  0076    History   Chief Complaint Chief Complaint  Patient presents with  . Shortness of Breath    CHF exacerbation    HPI Shady Ake is a 58 y.o. male.     HPI  58 year old male history of alcohol abuse, hypertension, combined systolic and diastolic congestive heart failure, hypertension presents today complaining of ongoing dyspnea.  He states that he was hospitalized last week and felt somewhat improved upon discharge.  He reports he has been taking his medication but has had gradually worsening shortness of breath.  He denies fever, chest pain, chills, cough, abdominal pain, nausea, vomiting, or diarrhea.  He states he has had some increased bilateral lower extremity swelling.  He states that he is living in a hotel and has continued to use cocaine and marijuana. Denies travel or known COVID-19 exposure Past Medical History:  Diagnosis Date  . Alcohol abuse   . Chronic combined systolic and diastolic CHF (congestive heart failure) (HCC)    a. 09/27/18 showed mild LVH, EF 20-25%, grade 2 DD, mild MR, severely dilated LA, mildly dilated RV with mildly reduced RV function, mod RAE.  Marland Kitchen Cocaine abuse (HCC)   . Hypertension     Patient Active Problem List   Diagnosis Date Noted  . Acute exacerbation of CHF (congestive heart failure) (HCC) 03/11/2019  . Troponin level elevated 03/11/2019  . Hyperlipidemia 03/03/2019  . NSVT (nonsustained ventricular tachycardia) (HCC) 03/03/2019  . Anxiety and depression   . NSTEMI (non-ST elevated myocardial infarction) (HCC) 02/28/2019  . Acute systolic heart failure (HCC) 11/08/2018  . Hypertensive urgency 09/28/2018  . Acute pulmonary edema (HCC) 09/28/2018  . Cocaine abuse (HCC) 09/28/2018  . Substance abuse (HCC) 09/28/2018  . Chronic kidney disease, stage II (mild) 09/28/2018  . Normochromic anemia 09/28/2018  .  Acute systolic CHF (congestive heart failure) (HCC) 09/27/2018  . Essential hypertension 03/11/2016  . Obesity 03/11/2016  . Glucosuria 03/11/2016    History reviewed. No pertinent surgical history.      Home Medications    Prior to Admission medications   Medication Sig Start Date End Date Taking? Authorizing Provider  aspirin EC 81 MG EC tablet Take 1 tablet (81 mg total) by mouth daily. 03/03/19   Arty Baumgartner, NP  atorvastatin (LIPITOR) 80 MG tablet Take 1 tablet (80 mg total) by mouth daily. 03/03/19 07/01/19  Arty Baumgartner, NP  carvedilol (COREG) 12.5 MG tablet Take 1 tablet (12.5 mg total) by mouth 2 (two) times daily with a meal. 03/13/19   Regalado, Belkys A, MD  clopidogrel (PLAVIX) 75 MG tablet Take 1 tablet (75 mg total) by mouth daily. 03/03/19   Arty Baumgartner, NP  furosemide (LASIX) 40 MG tablet Take 1 tablet (40 mg total) by mouth 2 (two) times daily. 03/13/19   Regalado, Belkys A, MD  isosorbide-hydrALAZINE (BIDIL) 20-37.5 MG tablet Take 1 tablet by mouth 3 (three) times daily. 03/13/19   Regalado, Belkys A, MD  losartan (COZAAR) 25 MG tablet Take 1 tablet (25 mg total) by mouth daily. 03/13/19   Regalado, Belkys A, MD  prazosin (MINIPRESS) 1 MG capsule Take 1 capsule (1 mg total) by mouth at bedtime. 03/03/19   Arty Baumgartner, NP  spironolactone (ALDACTONE) 25 MG tablet Take 25 mg by mouth daily.    [provider]    Family History Family History  Problem Relation Age  of Onset  . Hypertension Maternal Grandmother     Social History Social History   Tobacco Use  . Smoking status: Never Smoker  . Smokeless tobacco: Never Used  Substance Use Topics  . Alcohol use: Yes    Comment: 2 quarts a week   . Drug use: Yes    Frequency: 3.0 times per week    Types: Marijuana, Cocaine     Allergies   Hydrocodone   Review of Systems Review of Systems  All other systems reviewed and are negative.    Physical Exam Updated Vital Signs BP (!)  144/97 (BP Location: Right Arm)   Pulse 90   Temp 97.8 F (36.6 C) (Oral)   Resp 20   SpO2 99%   Physical Exam Vitals signs and nursing note reviewed.  Constitutional:      Appearance: He is well-developed. He is obese.  HENT:     Head: Normocephalic and atraumatic.     Mouth/Throat:     Mouth: Mucous membranes are moist.  Eyes:     Pupils: Pupils are equal, round, and reactive to light.  Cardiovascular:     Rate and Rhythm: Normal rate and regular rhythm.  Pulmonary:     Effort: Pulmonary effort is normal.     Breath sounds: Normal breath sounds.  Abdominal:     General: Bowel sounds are normal.     Palpations: Abdomen is soft.  Musculoskeletal: Normal range of motion.     Right lower leg: Edema present.     Left lower leg: Edema present.  Skin:    General: Skin is warm and dry.     Capillary Refill: Capillary refill takes less than 2 seconds.  Neurological:     General: No focal deficit present.     Mental Status: He is alert.  Psychiatric:        Mood and Affect: Mood normal.      ED Treatments / Results  Labs (all labs ordered are listed, but only abnormal results are displayed) Labs Reviewed  SARS CORONAVIRUS 2 (HOSPITAL ORDER, PERFORMED IN [facility] LAB)  CBC  COMPREHENSIVE METABOLIC PANEL  TROPONIN I    EKG EKG Interpretation  Date/Time:  Saturday Mar 27 2019 07:34:55 EDT Ventricular Rate:  95 PR Interval:    QRS Duration: 102 QT Interval:  384 QTC Calculation: 483 R Axis:   8 Text Interpretation:  Sinus rhythm Prolonged PR interval Probable left atrial enlargement Nonspecific T abnormalities, lateral leads Borderline prolonged QT interval No significant change since last tracing Confirmed by Margarita Grizzle [phone]) on 03/27/2019 8:08:19 AM   Radiology Dg Chest Port 1 View  Result Date: 03/27/2019 CLINICAL DATA:  Shortness of breath EXAM: PORTABLE CHEST 1 VIEW COMPARISON:  Mar 10, 2019 FINDINGS: There is no edema or consolidation. There  is moderate generalized cardiomegaly. Pulmonary vascularity is normal. No adenopathy. No evident bone lesions. IMPRESSION: Moderate cardiomegaly.  No edema or consolidation. Electronically Signed   By: Bretta Bang III M.D.   On: 03/27/2019 08:20    Procedures Procedures (including critical care time)  Medications Ordered in ED Medications - No data to display   Initial Impression / Assessment and Plan / ED Course  I have reviewed the triage vital signs and the nursing notes.  Pertinent labs & imaging results that were available during my care of the patient were reviewed by me and considered in my medical decision making (see chart for details).    12:59 PM Remained hemodynamically  stable here in the ED.  He is convinced that he has some Lasix and has since given a dose of IV Lasix.  His sats have remained in the upper 90s.  We have been waiting for labs to be completed.  His troponin is elevated 0.04 but review reveals that this is lower than it has been over the past 4 months and historically was at 0.04 back in January before he had an increase.  I doubt this is indicative of any new cardiac ischemia.   Patient appears stable for discharge.  He is advised to continue some medications.  He can follow-up as scheduled as an outpatient.  We have discussed return precautions and he voiced understanding.   Final Clinical Impressions(s) / ED Diagnoses   Final diagnoses:  Dyspnea, unspecified type  H/O CHF    ED Discharge Orders    None       Margarita Grizzleay, Toyna Erisman, MD 03/27/19 1301

## 2019-03-27 NOTE — ED Notes (Signed)
Pt states he "needs his lasix and cant breathe" Dr. Rosalia Hammers notified and she will order for patient. Pt sitting up at side of bed, 02 100%, still denies COVID test

## 2019-03-27 NOTE — ED Notes (Signed)
Pt c/o "it is hard to breath. Can't get a big breath- like I have too much fluid"  Has not taken morning meds at this time.

## 2019-03-27 NOTE — ED Triage Notes (Addendum)
Pt arrives with [facility] from home c/o shortness of breath "on & off" for the past 6 months. Pt has hx of HTN, CHF and takes lasix. Pt states that he is homeless and that the "lasix is not strong enough for him." Pt refused IV access by EMS. Pt a&o x4.  EMS vitals: BP 170/110 HR 84 RR 20 100% O2 RA 98.7 temp CBG 102

## 2019-03-27 NOTE — Discharge Instructions (Addendum)
Your lab work appears stable here in the ED. Your chest x-[place] is clear Your oxygen levels have been in the upper 90s which is normal. Please continue taking your medications as previously prescribed and follow-up with your primary care doctor.

## 2019-04-11 ENCOUNTER — Other Ambulatory Visit: Payer: Self-pay

## 2019-04-11 ENCOUNTER — Emergency Department (HOSPITAL_COMMUNITY): Payer: Medicaid Other

## 2019-04-11 ENCOUNTER — Emergency Department (HOSPITAL_COMMUNITY)
Admission: EM | Admit: 2019-04-11 | Discharge: 2019-04-11 | Disposition: A | Discharge status: Home / Self Care | Payer: Medicaid Other | Attending: Emergency Medicine | Admitting: Emergency Medicine

## 2019-04-11 DIAGNOSIS — I5023 Acute on chronic systolic (congestive) heart failure: Secondary | ICD-10-CM | POA: Insufficient documentation

## 2019-04-11 DIAGNOSIS — R0602 Shortness of breath: Secondary | ICD-10-CM | POA: Diagnosis present

## 2019-04-11 DIAGNOSIS — Z7982 Long term (current) use of aspirin: Secondary | ICD-10-CM | POA: Diagnosis not present

## 2019-04-11 DIAGNOSIS — Z590 Homelessness: Secondary | ICD-10-CM | POA: Insufficient documentation

## 2019-04-11 DIAGNOSIS — I10 Essential (primary) hypertension: Secondary | ICD-10-CM | POA: Insufficient documentation

## 2019-04-11 DIAGNOSIS — Z79899 Other long term (current) drug therapy: Secondary | ICD-10-CM | POA: Insufficient documentation

## 2019-04-11 LAB — CBC WITH DIFFERENTIAL/PLATELET
Abs Immature Granulocytes: 0.03 10*3/uL (ref 0.00–0.07)
Basophils Absolute: 0 10*3/uL (ref 0.0–0.1)
Basophils Relative: 0 %
Eosinophils Absolute: 0.1 10*3/uL (ref 0.0–0.5)
Eosinophils Relative: 1 %
HCT: 36.3 % — ABNORMAL LOW (ref 39.0–52.0)
Hemoglobin: 11.9 g/dL — ABNORMAL LOW (ref 13.0–17.0)
Immature Granulocytes: 1 %
Lymphocytes Relative: 16 %
Lymphs Abs: 1 10*3/uL (ref 0.7–4.0)
MCH: 28.1 pg (ref 26.0–34.0)
MCHC: 32.8 g/dL (ref 30.0–36.0)
MCV: 85.6 fL (ref 80.0–100.0)
Monocytes Absolute: 0.5 10*3/uL (ref 0.1–1.0)
Monocytes Relative: 8 %
Neutro Abs: 4.8 10*3/uL (ref 1.7–7.7)
Neutrophils Relative %: 74 %
Platelets: 306 10*3/uL (ref 150–400)
RBC: 4.24 MIL/uL (ref 4.22–5.81)
RDW: 15.4 % (ref 11.5–15.5)
WBC: 6.3 10*3/uL (ref 4.0–10.5)
nRBC: 0 % (ref 0.0–0.2)

## 2019-04-11 LAB — TROPONIN I: Troponin I: 0.03 ng/mL (ref <0.03)

## 2019-04-11 LAB — BASIC METABOLIC PANEL
Anion gap: 9 (ref 5–15)
BUN: 25 mg/dL — ABNORMAL HIGH (ref 6–20)
CO2: 23 mmol/L (ref 22–32)
Calcium: 8.6 mg/dL — ABNORMAL LOW (ref 8.9–10.3)
Chloride: 107 mmol/L (ref 98–111)
Creatinine, Ser: 1.07 mg/dL (ref 0.61–1.24)
GFR calc Af Amer: >60 mL/min (ref >60)
GFR calc non Af Amer: >60 mL/min (ref >60)
Glucose, Bld: 102 mg/dL — ABNORMAL HIGH (ref 70–99)
Potassium: 3.6 mmol/L (ref 3.5–5.1)
Sodium: 139 mmol/L (ref 135–145)

## 2019-04-11 LAB — BRAIN NATRIURETIC PEPTIDE: B Natriuretic Peptide: 3187 pg/mL — ABNORMAL HIGH (ref 0.0–100.0)

## 2019-04-11 MED ORDER — FUROSEMIDE 10 MG/ML IJ SOLN
40.0000 mg | Freq: Once | INTRAMUSCULAR | Status: AC
  Administered 2019-04-11: 40 mg via INTRAVENOUS
  Filled 2019-04-11: qty 4

## 2019-04-11 MED ORDER — FUROSEMIDE 40 MG PO TABS: 40.0000 mg | ORAL_TABLET | Freq: Three times a day (TID) | ORAL | 0 refills | Status: DC

## 2019-04-11 NOTE — ED Provider Notes (Signed)
[facility] DEPT Provider Note   CSN: [id number] Arrival date & time: 04/11/19  0219    History   Chief Complaint Chief Complaint  Patient presents with  . Shortness of Breath    HPI Dale [name] is a 59 y.o. male.     HPI  This a 58 year old male with a history of heart failure, alcohol abuse, cocaine abuse who presents with shortness of breath.  Patient reports ongoing and worsening shortness of breath since hospitalization in early May.  He reports multiple changes in medications and compliance with medications.  He does not feel that his diuretic is effective and reports decreased urine output.  He reports orthopnea and dyspnea on exertion.  Denies any chest pain.  Reports lower extremity swelling.  He is unsure whether he has gained weight.  Currently he is living with a friend but states that "I cannot get any rest or take care of myself."  He reports frustration with his homelessness.  Past Medical History:  Diagnosis Date  . Alcohol abuse   . Chronic combined systolic and diastolic CHF (congestive heart failure) ([place])    a. 09/27/18 showed mild LVH, EF 20-25%, grade 2 DD, mild MR, severely dilated LA, mildly dilated RV with mildly reduced RV function, mod RAE.  Marland Kitchen Cocaine abuse ([place])   . Hypertension     Patient Active Problem List   Diagnosis Date Noted  . Acute exacerbation of CHF (congestive heart failure) ([place]) 03/11/2019  . Troponin level elevated 03/11/2019  . Hyperlipidemia 03/03/2019  . NSVT (nonsustained ventricular tachycardia) ([place]) 03/03/2019  . Anxiety and depression   . NSTEMI (non-ST elevated myocardial infarction) ([place]) 02/28/2019  . Acute systolic heart failure ([place]) 11/08/2018  . Hypertensive urgency 09/28/2018  . Acute pulmonary edema ([place]) 09/28/2018  . Cocaine abuse ([place]) 09/28/2018  . Substance abuse ([place]) 09/28/2018  . Chronic kidney disease, stage II (mild) 09/28/2018  . Normochromic anemia 09/28/2018  .  Acute systolic CHF (congestive heart failure) ([place]) 09/27/2018  . Essential hypertension 03/11/2016  . Obesity 03/11/2016  . Glucosuria 03/11/2016    No past surgical history on file.      Home Medications    Prior to Admission medications   Medication Sig Start Date End Date Taking? Authorizing Provider  aspirin EC 81 MG EC tablet Take 1 tablet (81 mg total) by mouth daily. 03/03/19  Yes Cheryln Manly, NP  atorvastatin (LIPITOR) 80 MG tablet Take 1 tablet (80 mg total) by mouth daily. 03/03/19 07/01/19 Yes Cheryln Manly, NP  carvedilol (COREG) 12.5 MG tablet Take 1 tablet (12.5 mg total) by mouth 2 (two) times daily with a meal. 03/13/19  Yes Regalado, Belkys A, MD  clopidogrel (PLAVIX) 75 MG tablet Take 1 tablet (75 mg total) by mouth daily. 03/03/19  Yes Cheryln Manly, NP  isosorbide-hydrALAZINE (BIDIL) 20-37.5 MG tablet Take 1 tablet by mouth 3 (three) times daily. 03/13/19  Yes Regalado, Belkys A, MD  losartan (COZAAR) 25 MG tablet Take 1 tablet (25 mg total) by mouth daily. 03/13/19  Yes Regalado, Belkys A, MD  prazosin (MINIPRESS) 1 MG capsule Take 1 capsule (1 mg total) by mouth at bedtime. 03/03/19  Yes Reino Bellis B, NP  spironolactone (ALDACTONE) 25 MG tablet Take 25 mg by mouth daily.   Yes [provider]  furosemide (LASIX) 40 MG tablet Take 1 tablet (40 mg total) by mouth 3 (three) times daily. For a total of 5 days 04/11/19   Jode Lippe, Loma Sousa  F, MD    Family History Family History  Problem Relation Age of Onset  . Hypertension Maternal Grandmother     Social History Social History   Tobacco Use  . Smoking status: Never Smoker  . Smokeless tobacco: Never Used  Substance Use Topics  . Alcohol use: Yes    Comment: 2 quarts a week   . Drug use: Yes    Frequency: 3.0 times per week    Types: Marijuana, Cocaine     Allergies   Hydrocodone   Review of Systems Review of Systems  Constitutional: Negative for fever.  Respiratory: Positive  for shortness of breath. Negative for cough.   Cardiovascular: Positive for leg swelling. Negative for chest pain.  Gastrointestinal: Negative for abdominal pain, nausea and vomiting.  Genitourinary: Negative for dysuria.  All other systems reviewed and are negative.    Physical Exam Updated Vital Signs BP (!) 157/116   Pulse 83   Temp 98.1 F (36.7 C) (Oral)   Resp (!) 22   SpO2 95%   Physical Exam Vitals signs and nursing note reviewed.  Constitutional:      Appearance: He is well-developed. He is obese.  HENT:     Head: Normocephalic and atraumatic.  Eyes:     Pupils: Pupils are equal, round, and reactive to light.  Neck:     Musculoskeletal: Neck supple.  Cardiovascular:     Rate and Rhythm: Normal rate and regular rhythm.     Heart sounds: Normal heart sounds. No murmur.  Pulmonary:     Effort: Pulmonary effort is normal. No respiratory distress.     Breath sounds: Normal breath sounds. No wheezing, rhonchi or rales.     Comments: No respiratory distress Abdominal:     General: Bowel sounds are normal.     Palpations: Abdomen is soft.     Tenderness: There is no abdominal tenderness. There is no rebound.  Musculoskeletal:     Right lower leg: Edema present.     Left lower leg: Edema present.     Comments: 1+ bilateral lower extremity edema  Lymphadenopathy:     Cervical: No cervical adenopathy.  Skin:    General: Skin is warm and dry.  Neurological:     Mental Status: He is alert and oriented to person, place, and time.  Psychiatric:        Mood and Affect: Mood normal.      ED Treatments / Results  Labs (all labs ordered are listed, but only abnormal results are displayed) Labs Reviewed  CBC WITH DIFFERENTIAL/PLATELET - Abnormal; Notable for the following components:      Result Value   Hemoglobin 11.9 (*)    HCT 36.3 (*)    All other components within normal limits  BASIC METABOLIC PANEL - Abnormal; Notable for the following components:    Glucose, Bld 102 (*)    BUN 25 (*)    Calcium 8.6 (*)    All other components within normal limits  BRAIN NATRIURETIC PEPTIDE - Abnormal; Notable for the following components:   B Natriuretic Peptide 3,187.0 (*)    All other components within normal limits  TROPONIN I - Abnormal; Notable for the following components:   Troponin I 0.03 (*)    All other components within normal limits    EKG EKG Interpretation  Date/Time:  Sunday April 11 2019 03:28:08 EDT Ventricular Rate:  90 PR Interval:    QRS Duration: 99 QT Interval:  405 QTC Calculation: 496 R  Axis:   19 Text Interpretation:  Sinus rhythm Ventricular premature complex Borderline prolonged PR interval Consider anterior infarct Nonspecific T abnormalities, lateral leads No significant change since last tracing Confirmed by Ross Marcus ([id number]) on 04/11/2019 5:04:20 AM   Radiology Dg Chest 2 View  Result Date: 04/11/2019 CLINICAL DATA:  Patient with shortness of breath. EXAM: CHEST - 2 VIEW COMPARISON:  Chest radiograph 03/27/2019 FINDINGS: Monitoring leads overlie the patient. Cardiomegaly. Bilateral interstitial pulmonary opacities. Small bilateral pleural effusions. Regional skeleton is unremarkable. IMPRESSION: Cardiomegaly.  Mild interstitial pulmonary edema. Electronically Signed   By: Annia Belt M.D.   On: 04/11/2019 04:25    Procedures Procedures (including critical care time)  Medications Ordered in ED Medications  furosemide (LASIX) injection 40 mg (40 mg Intravenous Given 04/11/19 0506)     Initial Impression / Assessment and Plan / ED Course  I have reviewed the triage vital signs and the nursing notes.  Pertinent labs & imaging results that were available during my care of the patient were reviewed by me and considered in my medical decision making (see chart for details).         Patient presents with ongoing shortness of breath.  He is overall nontoxic-appearing.  Vital signs are notable for blood  pressure of 157/116.  He is satting 99% on room air and is in no respiratory distress.  Reports lower extremity edema and shortness of breath.  Heart failure is a consideration.  Other considerations include pneumonia.  Less likely PE.  EKG shows no evidence of acute ischemia.  Chest x-ray shows mild interstitial pulmonary edema.  Lab work-up is largely unremarkable.  BNP is slightly less than prior.  I have reviewed the patient's chart.  Echocardiogram in early May with an EF of 20 to 25%.  Patient was given 1 dose of IV Lasix.  Will increase Lasix over the next 5 days by taking an additional 40 mg.  He was given cardiology follow-up.  Patient is agreeable to plan and is stable at discharge.  After history, exam, and medical workup I feel the patient has been appropriately medically screened and is safe for discharge home. Pertinent diagnoses were discussed with the patient. Patient was given return precautions.   Final Clinical Impressions(s) / ED Diagnoses   Final diagnoses:  Acute on chronic systolic congestive heart failure [facility])    ED Discharge Orders         Ordered    furosemide (LASIX) 40 MG tablet  3 times daily     04/11/19 0521           Chauncey Sciulli, Mayer Masker, MD 04/11/19 [id number]

## 2019-04-11 NOTE — ED Notes (Signed)
Bed: WA20 Expected date:  Expected time:  Means of arrival:  Comments: EMS 58 yo male SOB-CHF

## 2019-04-11 NOTE — ED Notes (Signed)
Date and time results received: 04/11/19 4:50 AM  (use smartphrase ".now" to insert current time)  Test: Troponin Critical Value: 0.03  Name of Provider Notified: Dr.Horton  Orders Received? Or Actions Taken?:none

## 2019-04-28 ENCOUNTER — Encounter (HOSPITAL_COMMUNITY): Payer: Self-pay | Admitting: Emergency Medicine

## 2019-04-28 ENCOUNTER — Emergency Department (HOSPITAL_COMMUNITY)
Admission: EM | Admit: 2019-04-28 | Discharge: 2019-04-28 | Disposition: A | Discharge status: Home / Self Care | Payer: Medicaid Other | Attending: Emergency Medicine | Admitting: Emergency Medicine

## 2019-04-28 ENCOUNTER — Emergency Department (HOSPITAL_COMMUNITY): Payer: Medicaid Other

## 2019-04-28 ENCOUNTER — Other Ambulatory Visit: Payer: Self-pay

## 2019-04-28 DIAGNOSIS — Z7982 Long term (current) use of aspirin: Secondary | ICD-10-CM | POA: Diagnosis not present

## 2019-04-28 DIAGNOSIS — R0602 Shortness of breath: Secondary | ICD-10-CM

## 2019-04-28 DIAGNOSIS — Z7902 Long term (current) use of antithrombotics/antiplatelets: Secondary | ICD-10-CM | POA: Insufficient documentation

## 2019-04-28 DIAGNOSIS — Z79899 Other long term (current) drug therapy: Secondary | ICD-10-CM | POA: Insufficient documentation

## 2019-04-28 DIAGNOSIS — I5023 Acute on chronic systolic (congestive) heart failure: Secondary | ICD-10-CM | POA: Insufficient documentation

## 2019-04-28 DIAGNOSIS — I252 Old myocardial infarction: Secondary | ICD-10-CM | POA: Diagnosis not present

## 2019-04-28 DIAGNOSIS — R2243 Localized swelling, mass and lump, lower limb, bilateral: Secondary | ICD-10-CM | POA: Diagnosis present

## 2019-04-28 DIAGNOSIS — N182 Chronic kidney disease, stage 2 (mild): Secondary | ICD-10-CM | POA: Diagnosis not present

## 2019-04-28 DIAGNOSIS — I130 Hypertensive heart and chronic kidney disease with heart failure and stage 1 through stage 4 chronic kidney disease, or unspecified chronic kidney disease: Secondary | ICD-10-CM | POA: Diagnosis not present

## 2019-04-28 LAB — BASIC METABOLIC PANEL
Anion gap: 12 (ref 5–15)
BUN: 36 mg/dL — ABNORMAL HIGH (ref 6–20)
CO2: 22 mmol/L (ref 22–32)
Calcium: 9.1 mg/dL (ref 8.9–10.3)
Chloride: 102 mmol/L (ref 98–111)
Creatinine, Ser: 1.55 mg/dL — ABNORMAL HIGH (ref 0.61–1.24)
GFR calc Af Amer: 58 mL/min — ABNORMAL LOW (ref >60)
GFR calc non Af Amer: 50 mL/min — ABNORMAL LOW (ref >60)
Glucose, Bld: 109 mg/dL — ABNORMAL HIGH (ref 70–99)
Potassium: 3.9 mmol/L (ref 3.5–5.1)
Sodium: 136 mmol/L (ref 135–145)

## 2019-04-28 LAB — CBC
HCT: 37.8 % — ABNORMAL LOW (ref 39.0–52.0)
Hemoglobin: 12.5 g/dL — ABNORMAL LOW (ref 13.0–17.0)
MCH: 27.3 pg (ref 26.0–34.0)
MCHC: 33.1 g/dL (ref 30.0–36.0)
MCV: 82.5 fL (ref 80.0–100.0)
Platelets: 346 10*3/uL (ref 150–400)
RBC: 4.58 MIL/uL (ref 4.22–5.81)
RDW: 15.9 % — ABNORMAL HIGH (ref 11.5–15.5)
WBC: 8.5 10*3/uL (ref 4.0–10.5)
nRBC: 0 % (ref 0.0–0.2)

## 2019-04-28 MED ORDER — SODIUM CHLORIDE 0.9% FLUSH
3.0000 mL | Freq: Once | INTRAVENOUS | Status: AC
  Administered 2019-04-28: 3 mL via INTRAVENOUS

## 2019-04-28 MED ORDER — FUROSEMIDE 10 MG/ML IJ SOLN
40.0000 mg | Freq: Once | INTRAMUSCULAR | Status: AC
  Administered 2019-04-28: 40 mg via INTRAVENOUS
  Filled 2019-04-28: qty 4

## 2019-04-28 MED ORDER — LOSARTAN POTASSIUM 25 MG PO TABS: 25.0000 mg | ORAL_TABLET | Freq: Every day | ORAL | 0 refills | Status: DC

## 2019-04-28 MED ORDER — FUROSEMIDE 40 MG PO TABS: 40.0000 mg | ORAL_TABLET | Freq: Three times a day (TID) | ORAL | 0 refills | Status: DC

## 2019-04-28 NOTE — TOC Transition Note (Signed)
Transition of Care [facility]) - CM/SW Discharge Note   Patient Details  Name: Dale [name] MRN: [id number] Date of Birth: Sep 10, 1961  [facility] CM/SW Contact:  Fuller Mandril, RN Phone Number: 04/28/2019, 8:58 AM   Clinical Narrative:    [facility] consulted regarding medication assistance and PCP establishment.   Final next level of care: Homeless Shelter Barriers to Discharge: Barriers Resolved   Patient Goals and CMS Choice Patient states their goals for this hospitalization and ongoing recovery are:: get my medicine right      Discharge Placement                       Discharge Plan and Services   Discharge Planning Services: CM Consult                  Lyndal Alamillo J. Clydene Laming, RN, BSN, [place] [phone]  [facility], [facility], [facility] set up appointment with [facility] on 7/1 @1 :130.  Spoke with pt at bedside and advised that office will call him the day before appointment with instructions for appointment. Pt verbalizes understanding of keeping appointment.                 Social Determinants of Health (SDOH) Interventions     Readmission Risk Interventions Readmission Risk Prevention Plan 03/03/2019  Transportation Screening Complete  Medication Review Press photographer) Complete  PCP or Specialist appointment within 3-5 days of discharge Complete  HRI or [place] (No Data)  SW Recovery Care/Counseling Consult Complete  Palliative Care Screening Not [place] Not Applicable  Some recent data might be hidden

## 2019-04-28 NOTE — ED Provider Notes (Signed)
I received the patient in signout from Dr. Dina Rich, patient has a history of heart failure and is not been taking his medications.  Worsening shortness of breath but was able to ambulate without hypoxia here.  Plan is to discharge with Lasix 3 times a day for the next 5 days and follow-up in the clinic.  Awaiting case management evaluation to ensure the patient can be followed up as an outpatient and have his medications filled.   Deno Etienne, DO 04/28/19 [phone]

## 2019-04-28 NOTE — ED Notes (Addendum)
PT was able to ambulate without assistance, and oxygen saturation maintained 97%-94%

## 2019-04-28 NOTE — Discharge Instructions (Signed)
Return for worsening shortness of breath.  Follow up in clinic.  Take your medications as prescribed.

## 2019-04-28 NOTE — ED Triage Notes (Signed)
Pt to ED via [facility] with c/o bil lower ankle and feet swelling.  Hx of CHF.  Pt st's he takes Lasix at home but is not working.

## 2019-04-28 NOTE — ED Notes (Signed)
Pt reports leg swelling, SOB, and weakness for the past 2 days.

## 2019-04-28 NOTE — ED Provider Notes (Signed)
[facility] EMERGENCY DEPARTMENT Provider Note   CSN: [id number] Arrival date & time: 04/28/19  0030     History   Chief Complaint Chief Complaint  Patient presents with  . Leg Swelling  . Shortness of Breath    HPI Dale [name] is a 58 y.o. male.     HPI  This is a 58 year old male with a history of hypertension, heart failure, polysubstance abuse who is homeless who presents with lower extremity swelling and shortness of breath.  Patient reports several day history of worsening lower extremity swelling and shortness of breath.  He reports orthopnea.  He reports that he ran out of all of his medications including his Lasix yesterday.  However, he does not feel like the Lasix helps.  He was seen and evaluated by me approximately 2 weeks ago and I increased his Lasix.  Since that time he has not followed up with cardiology as recommended.  Patient denies any chest pain during this time.  He denies any fevers or cough.  He denies nausea, vomiting, abdominal pain, diarrhea.  Patient currently does not have a place to stay.  Past Medical History:  Diagnosis Date  . Alcohol abuse   . Chronic combined systolic and diastolic CHF (congestive heart failure) (HCC)    a. 09/27/18 showed mild LVH, EF 20-25%, grade 2 DD, mild MR, severely dilated LA, mildly dilated RV with mildly reduced RV function, mod RAE.  Marland Kitchen Cocaine abuse (HCC)   . Hypertension     Patient Active Problem List   Diagnosis Date Noted  . Acute exacerbation of CHF (congestive heart failure) (HCC) 03/11/2019  . Troponin level elevated 03/11/2019  . Hyperlipidemia 03/03/2019  . NSVT (nonsustained ventricular tachycardia) (HCC) 03/03/2019  . Anxiety and depression   . NSTEMI (non-ST elevated myocardial infarction) (HCC) 02/28/2019  . Acute systolic heart failure (HCC) 11/08/2018  . Hypertensive urgency 09/28/2018  . Acute pulmonary edema (HCC) 09/28/2018  . Cocaine abuse (HCC) 09/28/2018  . Substance  abuse (HCC) 09/28/2018  . Chronic kidney disease, stage II (mild) 09/28/2018  . Normochromic anemia 09/28/2018  . Acute systolic CHF (congestive heart failure) (HCC) 09/27/2018  . Essential hypertension 03/11/2016  . Obesity 03/11/2016  . Glucosuria 03/11/2016    History reviewed. No pertinent surgical history.      Home Medications    Prior to Admission medications   Medication Sig Start Date End Date Taking? Authorizing Provider  furosemide (LASIX) 40 MG tablet Take 1 tablet (40 mg total) by mouth 3 (three) times daily. For a total of 5 days Patient taking differently: Take 40 mg by mouth 2 (two) times daily.  04/11/19  Yes Fard Borunda, Mayer Masker, MD  aspirin EC 81 MG EC tablet Take 1 tablet (81 mg total) by mouth daily. 03/03/19   Arty Baumgartner, NP  atorvastatin (LIPITOR) 80 MG tablet Take 1 tablet (80 mg total) by mouth daily. 03/03/19 07/01/19  Arty Baumgartner, NP  carvedilol (COREG) 12.5 MG tablet Take 1 tablet (12.5 mg total) by mouth 2 (two) times daily with a meal. 03/13/19   Regalado, Belkys A, MD  clopidogrel (PLAVIX) 75 MG tablet Take 1 tablet (75 mg total) by mouth daily. 03/03/19   Arty Baumgartner, NP  isosorbide-hydrALAZINE (BIDIL) 20-37.5 MG tablet Take 1 tablet by mouth 3 (three) times daily. 03/13/19   Regalado, Belkys A, MD  losartan (COZAAR) 25 MG tablet Take 1 tablet (25 mg total) by mouth daily. 03/13/19   Regalado, Belkys A,  MD  prazosin (MINIPRESS) 1 MG capsule Take 1 capsule (1 mg total) by mouth at bedtime. 03/03/19   Arty Baumgartneroberts, Lindsay B, NP  spironolactone (ALDACTONE) 25 MG tablet Take 25 mg by mouth daily.    [provider]    Family History Family History  Problem Relation Age of Onset  . Hypertension Maternal Grandmother     Social History Social History   Tobacco Use  . Smoking status: Never Smoker  . Smokeless tobacco: Never Used  Substance Use Topics  . Alcohol use: Yes    Comment: 2 quarts a week   . Drug use: Yes    Frequency: 3.0  times per week    Types: Marijuana, Cocaine     Allergies   Hydrocodone   Review of Systems Review of Systems  Constitutional: Negative for fever.  Respiratory: Positive for shortness of breath. Negative for cough.   Cardiovascular: Positive for leg swelling. Negative for chest pain.  Gastrointestinal: Negative for abdominal pain, nausea and vomiting.  Genitourinary: Negative for dysuria.  Neurological: Negative for headaches.  All other systems reviewed and are negative.    Physical Exam Updated Vital Signs BP 135/90   Pulse 96   Temp 97.9 F (36.6 C) (Oral)   Resp (!) 27   Ht 1.803 m (5\' 11" )   Wt 104.3 kg   SpO2 94%   BMI 32.08 kg/m   Physical Exam Vitals signs and nursing note reviewed.  Constitutional:      General: He is not in acute distress.    Appearance: He is well-developed.  HENT:     Head: Normocephalic and atraumatic.  Eyes:     Pupils: Pupils are equal, round, and reactive to light.  Neck:     Musculoskeletal: Neck supple.  Cardiovascular:     Rate and Rhythm: Normal rate and regular rhythm.     Heart sounds: Normal heart sounds. No murmur.  Pulmonary:     Effort: Pulmonary effort is normal. No respiratory distress.     Breath sounds: Normal breath sounds. No wheezing.     Comments: Fair air movement, fine crackles noted in the bilateral bases, no respiratory distress Abdominal:     General: Bowel sounds are normal.     Palpations: Abdomen is soft.     Tenderness: There is no abdominal tenderness. There is no rebound.  Musculoskeletal:     Right lower leg: Edema present.     Left lower leg: Edema present.     Comments: Plus pitting edema to the ankles  Skin:    General: Skin is warm and dry.  Neurological:     Mental Status: He is alert and oriented to person, place, and time.  Psychiatric:        Mood and Affect: Mood normal.      ED Treatments / Results  Labs (all labs ordered are listed, but only abnormal results are displayed)  Labs Reviewed  BASIC METABOLIC PANEL - Abnormal; Notable for the following components:      Result Value   Glucose, Bld 109 (*)    BUN 36 (*)    Creatinine, Ser 1.55 (*)    GFR calc non Af Amer 50 (*)    GFR calc Af Amer 58 (*)    All other components within normal limits  CBC - Abnormal; Notable for the following components:   Hemoglobin 12.5 (*)    HCT 37.8 (*)    RDW 15.9 (*)    All other components  within normal limits    EKG EKG Interpretation  Date/Time:  Wednesday April 28 2019 00:48:06 EDT Ventricular Rate:  96 PR Interval:  202 QRS Duration: 102 QT Interval:  408 QTC Calculation: 515 R Axis:   34 Text Interpretation:  Normal sinus rhythm Anterior infarct , age undetermined Prolonged QT Abnormal ECG Confirmed by Thayer Jew [phone]) on 04/28/2019 5:38:18 AM   Radiology Dg Chest 2 View  Result Date: 04/28/2019 CLINICAL DATA:  Shortness of breath EXAM: CHEST - 2 VIEW COMPARISON:  April 11, 2019 FINDINGS: Heart size is enlarged. There is no pneumothorax. No large pleural effusion. There is vascular congestion without overt pulmonary edema. There is no acute osseous abnormality. IMPRESSION: Cardiomegaly with vascular congestion. Electronically Signed   By: Constance Holster M.D.   On: 04/28/2019 01:08    Procedures Procedures (including critical care time)  Medications Ordered in ED Medications  sodium chloride flush (NS) 0.9 % injection 3 mL (3 mLs Intravenous Given 04/28/19 0617)  furosemide (LASIX) injection 40 mg (40 mg Intravenous Given 04/28/19 0617)     Initial Impression / Assessment and Plan / ED Course  I have reviewed the triage vital signs and the nursing notes.  Pertinent labs & imaging results that were available during my care of the patient were reviewed by me and considered in my medical decision making (see chart for details).        Patient presents with shortness of breath and leg swelling.  He is overall nontoxic-appearing and vital signs  are reassuring.  EKG is nonischemic.  Chest x-ray shows no evidence of pneumothorax or pneumonia.  He does have some evidence of vascular congestion.  He has lower extremity swelling.  Suspect mild heart failure exacerbation.  He is satting 95% on room air.  He is able to ambulate and maintain his O2 sats.  Patient was given 40 mg of IV Lasix.  Suspect he may need titration of his Lasix.  Creatinine has marginally increased to 1.55.  On recheck, patient noted to go from comfortable to tachypneic after I entered the room.  We discussed his findings.  He insists that "I am too sick to go home."  I discussed with him that objectively his work-up is reassuring and he needs to continue his Lasix.  I suspect part of his issue is not having a consistent living environment.  We will have case management evaluate him.    Final Clinical Impressions(s) / ED Diagnoses   Final diagnoses:  Acute on chronic systolic heart failure ([place])  Shortness of breath    ED Discharge Orders    None       Merryl Hacker, MD 04/28/19 [phone]

## 2019-04-28 NOTE — ED Notes (Signed)
D/c reviewed with patient. Patient states he ran out of all his meds and needs to refill all. Upon review of med list with EDP, Patient is found to have 3 refills from end of April on most of his meds. Patient is encouraged to call his pharmacist

## 2019-04-28 NOTE — ED Notes (Addendum)
PT is refusing to wear gown.

## 2019-04-28 NOTE — TOC Initial Note (Signed)
Transition of Care [facility]) - Initial/Assessment Note    Patient Details  Name: Dale [name] MRN: [id number] Date of Birth: October 26, 1961  [facility]) CM/SW Contact:    Fuller Mandril, RN Phone Number: 04/28/2019, 9:01 AM  Clinical Narrative:                 [facility] consulted regarding PCP establishment and medication assitance.  Expected Discharge Plan: Home/Self Care Barriers to Discharge: Barriers Resolved   Patient Goals and CMS Choice Patient states their goals for this hospitalization and ongoing recovery are:: get my medicine right      Expected Discharge Plan and Services Expected Discharge Plan: Home/Self Care   Discharge Planning Services: CM Consult   Living arrangements for the past 2 months: Homeless Shelter                        [facility] reviewed chart to find pt has [employer] (pt states he just received his card 3 days ago) with Rx coverage but NO PCP listed.               Prior Living Arrangements/Services Living arrangements for the past 2 months: [place] with:: Self                   Activities of Daily Living      Permission Sought/Granted Permission sought to share information with : PCP Permission granted to share information with : Yes, Verbal Permission Granted              Emotional Assessment Appearance:: Appears stated age Attitude/Demeanor/Rapport: Lethargic, Engaged Affect (typically observed): Restless   Alcohol / Substance Use: Alcohol Use, Tobacco Use Psych Involvement: No (comment)  Admission diagnosis:  Edema  Patient Active Problem List   Diagnosis Date Noted  . Acute exacerbation of CHF (congestive heart failure) ([place]) 03/11/2019  . Troponin level elevated 03/11/2019  . Hyperlipidemia 03/03/2019  . NSVT (nonsustained ventricular tachycardia) ([place]) 03/03/2019  . Anxiety and depression   . NSTEMI (non-ST elevated myocardial infarction) ([place]) 02/28/2019  . Acute systolic heart  failure ([place]) 11/08/2018  . Hypertensive urgency 09/28/2018  . Acute pulmonary edema ([place]) 09/28/2018  . Cocaine abuse ([place]) 09/28/2018  . Substance abuse ([place]) 09/28/2018  . Chronic kidney disease, stage II (mild) 09/28/2018  . Normochromic anemia 09/28/2018  . Acute systolic CHF (congestive heart failure) ([place]) 09/27/2018  . Essential hypertension 03/11/2016  . Obesity 03/11/2016  . Glucosuria 03/11/2016   PCP:  Patient, No Pcp Per Pharmacy:   [place], [address] [place] [address] Phone: [phone] Fax: [phone]  [place], [address] [place] [place] [address] Phone: [phone] Fax: [phone]     Social Determinants of Health (SDOH) Interventions    Readmission Risk Interventions Readmission Risk Prevention Plan 03/03/2019  Transportation Screening Complete  Medication Review Press photographer) Complete  PCP or Specialist appointment within 3-5 days of discharge Complete  HRI or [place] (No Data)  SW Recovery Care/Counseling Consult Complete  Palliative Care Screening Not [place] Not Applicable  Some recent data might be hidden

## 2019-05-05 ENCOUNTER — Ambulatory Visit (INDEPENDENT_AMBULATORY_CARE_PROVIDER_SITE_OTHER): Payer: Medicaid Other | Admitting: Primary Care

## 2019-05-05 ENCOUNTER — Other Ambulatory Visit: Payer: Self-pay

## 2019-05-05 ENCOUNTER — Encounter (INDEPENDENT_AMBULATORY_CARE_PROVIDER_SITE_OTHER): Payer: Self-pay | Admitting: Primary Care

## 2019-05-05 DIAGNOSIS — Z7689 Persons encountering health services in other specified circumstances: Secondary | ICD-10-CM

## 2019-05-05 DIAGNOSIS — M79604 Pain in right leg: Secondary | ICD-10-CM | POA: Diagnosis not present

## 2019-05-05 DIAGNOSIS — M79605 Pain in left leg: Secondary | ICD-10-CM

## 2019-05-05 DIAGNOSIS — F5101 Primary insomnia: Secondary | ICD-10-CM | POA: Diagnosis not present

## 2019-05-05 DIAGNOSIS — I5043 Acute on chronic combined systolic (congestive) and diastolic (congestive) heart failure: Secondary | ICD-10-CM | POA: Diagnosis not present

## 2019-05-05 MED ORDER — DICLOFENAC SODIUM 1 % TD GEL: 4.0000 g | Freq: Four times a day (QID) | TRANSDERMAL | 2 refills | Status: DC

## 2019-05-05 MED ORDER — DICLOFENAC SODIUM 75 MG PO TBEC: 75.0000 mg | DELAYED_RELEASE_TABLET | Freq: Two times a day (BID) | ORAL | 0 refills | Status: DC

## 2019-05-05 NOTE — Progress Notes (Signed)
Virtual Visit via Telephone Note  I connected with Dale [name] on 05/05/19 at  1:30 PM EDT by telephone and verified that I am speaking with the correct person using two identifiers.   I discussed the limitations, risks, security and privacy concerns of performing an evaluation and management service by telephone and the availability of in person appointments. I also discussed with the patient that there may be a patient responsible charge related to this service. The patient expressed understanding and agreed to proceed.   History of Present Illness: Dale [name] presented on emergency room with increasing shortness of breath  with complaint of leg pain and swelling. He also reported he was out of all his medications .  He has a past medical history of noncompliance with medications, hypertension, heart failure, polysubstance abuse and homeless. He denies shortness of breath, headaches, chest pain but does have lower extremity edema   Observations/Objective: Review of Systems  Constitutional: Negative.   HENT: Negative.   Eyes: Negative.   Cardiovascular: Positive for leg swelling.  Gastrointestinal: Negative.   Musculoskeletal: Positive for back pain and neck pain.  Skin: Negative.   Neurological: Negative.   Endo/Heme/Allergies: Negative.   Psychiatric/Behavioral: The patient has insomnia.    Assessment and Plan: Dale [name] was seen today for hospitalization follow-up.  Diagnoses and all orders for this visit:  Acute on chronic combined systolic and diastolic congestive heart failure ([place]) Noncompliance with medication was in the ED on April 11, 2019 and April 28, 2023 both visits he complained of worsening shortness of breath dyspnea on exertion and lower extremity edema, September 27, 2018 showed mild left ventricular hypertrophy with the EF to 20 to 25%  Obesity, morbid ([place]) He is homeless unable to buy the foods to help reduce weight and due to chronic lower extremity edema  with pain unable to exercise.  Primary insomnia Due to social and psychological issues and a history of polysubstance abuse, decline treating with anything other than over-the-counter melatonin or Benadryl  Leg pain, bilateral This primarily comes from edema/obesity  review of chart indicates patient should be on Lasix 3 times a day that is probably challenging to take diuretic and being homeless in the midst of the pandemic trying to find a available bathroom can be a challenge.  This is also  Maybe a factor in his noncompliance.  Will prescribe analgesic for his legs  Encounter to establish care Frequent emergency room visits and hospitalization due to noncompliance and no primary care to follow.  Other orders -     diclofenac (VOLTAREN) 75 MG EC tablet; Take 1 tablet (75 mg total) by mouth 2 (two) times daily. -     diclofenac sodium (VOLTAREN) 1 % GEL; Apply 4 g topically 4 (four) times daily.    Follow Up Instructions:    I discussed the assessment and treatment plan with the patient. The patient was provided an opportunity to ask questions and all were answered. The patient agreed with the plan and demonstrated an understanding of the instructions.   The patient was advised to call back or seek an in-person evaluation if the symptoms worsen or if the condition fails to improve as anticipated.  I provided 24 minutes of non-face-to-face time during this encounter.  This includes reviewing notes. Admissions. labs and imaging   Kerin Perna, NP

## 2019-05-07 ENCOUNTER — Inpatient Hospital Stay (HOSPITAL_COMMUNITY)
Admission: EM | Admit: 2019-05-07 | Discharge: 2019-05-11 | DRG: 286 | Disposition: A | Discharge status: Home / Self Care | Payer: Medicaid Other | Attending: Internal Medicine | Admitting: Internal Medicine

## 2019-05-07 ENCOUNTER — Other Ambulatory Visit: Payer: Self-pay

## 2019-05-07 ENCOUNTER — Emergency Department (HOSPITAL_COMMUNITY): Payer: Medicaid Other

## 2019-05-07 ENCOUNTER — Encounter (HOSPITAL_COMMUNITY): Payer: Self-pay

## 2019-05-07 ENCOUNTER — Inpatient Hospital Stay (HOSPITAL_COMMUNITY): Payer: Medicaid Other

## 2019-05-07 DIAGNOSIS — I130 Hypertensive heart and chronic kidney disease with heart failure and stage 1 through stage 4 chronic kidney disease, or unspecified chronic kidney disease: Secondary | ICD-10-CM | POA: Diagnosis present

## 2019-05-07 DIAGNOSIS — N179 Acute kidney failure, unspecified: Secondary | ICD-10-CM | POA: Diagnosis present

## 2019-05-07 DIAGNOSIS — E785 Hyperlipidemia, unspecified: Secondary | ICD-10-CM | POA: Diagnosis present

## 2019-05-07 DIAGNOSIS — R0602 Shortness of breath: Secondary | ICD-10-CM | POA: Diagnosis present

## 2019-05-07 DIAGNOSIS — I5021 Acute systolic (congestive) heart failure: Secondary | ICD-10-CM | POA: Diagnosis not present

## 2019-05-07 DIAGNOSIS — I10 Essential (primary) hypertension: Secondary | ICD-10-CM | POA: Diagnosis present

## 2019-05-07 DIAGNOSIS — I4581 Long QT syndrome: Secondary | ICD-10-CM | POA: Diagnosis not present

## 2019-05-07 DIAGNOSIS — Z791 Long term (current) use of non-steroidal anti-inflammatories (NSAID): Secondary | ICD-10-CM | POA: Diagnosis not present

## 2019-05-07 DIAGNOSIS — Z79899 Other long term (current) drug therapy: Secondary | ICD-10-CM

## 2019-05-07 DIAGNOSIS — I252 Old myocardial infarction: Secondary | ICD-10-CM

## 2019-05-07 DIAGNOSIS — Z9119 Patient's noncompliance with other medical treatment and regimen: Secondary | ICD-10-CM | POA: Diagnosis not present

## 2019-05-07 DIAGNOSIS — D638 Anemia in other chronic diseases classified elsewhere: Secondary | ICD-10-CM | POA: Diagnosis present

## 2019-05-07 DIAGNOSIS — Z7982 Long term (current) use of aspirin: Secondary | ICD-10-CM | POA: Diagnosis not present

## 2019-05-07 DIAGNOSIS — I255 Ischemic cardiomyopathy: Secondary | ICD-10-CM | POA: Diagnosis present

## 2019-05-07 DIAGNOSIS — F1410 Cocaine abuse, uncomplicated: Secondary | ICD-10-CM | POA: Diagnosis present

## 2019-05-07 DIAGNOSIS — Z9111 Patient's noncompliance with dietary regimen: Secondary | ICD-10-CM

## 2019-05-07 DIAGNOSIS — N289 Disorder of kidney and ureter, unspecified: Secondary | ICD-10-CM

## 2019-05-07 DIAGNOSIS — I5043 Acute on chronic combined systolic (congestive) and diastolic (congestive) heart failure: Secondary | ICD-10-CM

## 2019-05-07 DIAGNOSIS — I509 Heart failure, unspecified: Secondary | ICD-10-CM

## 2019-05-07 DIAGNOSIS — N182 Chronic kidney disease, stage 2 (mild): Secondary | ICD-10-CM | POA: Diagnosis present

## 2019-05-07 DIAGNOSIS — R112 Nausea with vomiting, unspecified: Secondary | ICD-10-CM | POA: Diagnosis not present

## 2019-05-07 DIAGNOSIS — I272 Pulmonary hypertension, unspecified: Secondary | ICD-10-CM | POA: Diagnosis present

## 2019-05-07 DIAGNOSIS — Z1159 Encounter for screening for other viral diseases: Secondary | ICD-10-CM | POA: Diagnosis not present

## 2019-05-07 DIAGNOSIS — Z8249 Family history of ischemic heart disease and other diseases of the circulatory system: Secondary | ICD-10-CM

## 2019-05-07 DIAGNOSIS — Z590 Homelessness: Secondary | ICD-10-CM | POA: Diagnosis not present

## 2019-05-07 DIAGNOSIS — I5023 Acute on chronic systolic (congestive) heart failure: Secondary | ICD-10-CM | POA: Diagnosis not present

## 2019-05-07 DIAGNOSIS — E876 Hypokalemia: Secondary | ICD-10-CM | POA: Diagnosis present

## 2019-05-07 DIAGNOSIS — I739 Peripheral vascular disease, unspecified: Secondary | ICD-10-CM | POA: Diagnosis present

## 2019-05-07 DIAGNOSIS — Z9114 Patient's other noncompliance with medication regimen: Secondary | ICD-10-CM | POA: Diagnosis not present

## 2019-05-07 DIAGNOSIS — D649 Anemia, unspecified: Secondary | ICD-10-CM

## 2019-05-07 DIAGNOSIS — F1010 Alcohol abuse, uncomplicated: Secondary | ICD-10-CM | POA: Diagnosis present

## 2019-05-07 DIAGNOSIS — R9431 Abnormal electrocardiogram [ECG] [EKG]: Secondary | ICD-10-CM | POA: Diagnosis not present

## 2019-05-07 DIAGNOSIS — N189 Chronic kidney disease, unspecified: Secondary | ICD-10-CM | POA: Diagnosis not present

## 2019-05-07 DIAGNOSIS — I2510 Atherosclerotic heart disease of native coronary artery without angina pectoris: Secondary | ICD-10-CM | POA: Diagnosis present

## 2019-05-07 DIAGNOSIS — Z7902 Long term (current) use of antithrombotics/antiplatelets: Secondary | ICD-10-CM | POA: Diagnosis not present

## 2019-05-07 LAB — CBC WITH DIFFERENTIAL/PLATELET
Abs Immature Granulocytes: 0.03 10*3/uL (ref 0.00–0.07)
Abs Immature Granulocytes: 0.03 10*3/uL (ref 0.00–0.07)
Basophils Absolute: 0 10*3/uL (ref 0.0–0.1)
Basophils Absolute: 0 10*3/uL (ref 0.0–0.1)
Basophils Relative: 0 %
Basophils Relative: 1 %
Eosinophils Absolute: 0 10*3/uL (ref 0.0–0.5)
Eosinophils Absolute: 0 10*3/uL (ref 0.0–0.5)
Eosinophils Relative: 0 %
Eosinophils Relative: 0 %
HCT: 36.5 % — ABNORMAL LOW (ref 39.0–52.0)
HCT: 35.7 % — ABNORMAL LOW (ref 39.0–52.0)
Hemoglobin: 12 g/dL — ABNORMAL LOW (ref 13.0–17.0)
Hemoglobin: 11.8 g/dL — ABNORMAL LOW (ref 13.0–17.0)
Immature Granulocytes: 0 %
Immature Granulocytes: 0 %
Lymphocytes Relative: 17 %
Lymphocytes Relative: 22 %
Lymphs Abs: 1.3 10*3/uL (ref 0.7–4.0)
Lymphs Abs: 1.7 10*3/uL (ref 0.7–4.0)
MCH: 27.4 pg (ref 26.0–34.0)
MCH: 27.7 pg (ref 26.0–34.0)
MCHC: 32.9 g/dL (ref 30.0–36.0)
MCHC: 33.1 g/dL (ref 30.0–36.0)
MCV: 83.3 fL (ref 80.0–100.0)
MCV: 83.8 fL (ref 80.0–100.0)
Monocytes Absolute: 0.8 10*3/uL (ref 0.1–1.0)
Monocytes Absolute: 0.9 10*3/uL (ref 0.1–1.0)
Monocytes Relative: 11 %
Monocytes Relative: 12 %
Neutro Abs: 5.3 10*3/uL (ref 1.7–7.7)
Neutro Abs: 5.1 10*3/uL (ref 1.7–7.7)
Neutrophils Relative %: 72 %
Neutrophils Relative %: 65 %
Platelets: 331 10*3/uL (ref 150–400)
Platelets: 328 10*3/uL (ref 150–400)
RBC: 4.38 MIL/uL (ref 4.22–5.81)
RBC: 4.26 MIL/uL (ref 4.22–5.81)
RDW: 16 % — ABNORMAL HIGH (ref 11.5–15.5)
RDW: 16.1 % — ABNORMAL HIGH (ref 11.5–15.5)
WBC: 7.5 10*3/uL (ref 4.0–10.5)
WBC: 7.8 10*3/uL (ref 4.0–10.5)
nRBC: 0 % (ref 0.0–0.2)
nRBC: 0 % (ref 0.0–0.2)

## 2019-05-07 LAB — BASIC METABOLIC PANEL
Anion gap: 10 (ref 5–15)
BUN: 32 mg/dL — ABNORMAL HIGH (ref 6–20)
CO2: 25 mmol/L (ref 22–32)
Calcium: 8.6 mg/dL — ABNORMAL LOW (ref 8.9–10.3)
Chloride: 101 mmol/L (ref 98–111)
Creatinine, Ser: 1.41 mg/dL — ABNORMAL HIGH (ref 0.61–1.24)
GFR calc Af Amer: >60 mL/min (ref >60)
GFR calc non Af Amer: 56 mL/min — ABNORMAL LOW (ref >60)
Glucose, Bld: 132 mg/dL — ABNORMAL HIGH (ref 70–99)
Potassium: 3.3 mmol/L — ABNORMAL LOW (ref 3.5–5.1)
Sodium: 136 mmol/L (ref 135–145)

## 2019-05-07 LAB — COMPREHENSIVE METABOLIC PANEL
ALT: 39 U/L (ref 0–44)
AST: 37 U/L (ref 15–41)
Albumin: 3.7 g/dL (ref 3.5–5.0)
Alkaline Phosphatase: 111 U/L (ref 38–126)
Anion gap: 13 (ref 5–15)
BUN: 30 mg/dL — ABNORMAL HIGH (ref 6–20)
CO2: 25 mmol/L (ref 22–32)
Calcium: 8.9 mg/dL (ref 8.9–10.3)
Chloride: 100 mmol/L (ref 98–111)
Creatinine, Ser: 1.31 mg/dL — ABNORMAL HIGH (ref 0.61–1.24)
GFR calc Af Amer: >60 mL/min (ref >60)
GFR calc non Af Amer: >60 mL/min (ref >60)
Glucose, Bld: 120 mg/dL — ABNORMAL HIGH (ref 70–99)
Potassium: 4.1 mmol/L (ref 3.5–5.1)
Sodium: 138 mmol/L (ref 135–145)
Total Bilirubin: 2 mg/dL — ABNORMAL HIGH (ref 0.3–1.2)
Total Protein: 7.4 g/dL (ref 6.5–8.1)

## 2019-05-07 LAB — MAGNESIUM
Magnesium: 2.2 mg/dL (ref 1.7–2.4)
Magnesium: 2.3 mg/dL (ref 1.7–2.4)

## 2019-05-07 LAB — HEPATIC FUNCTION PANEL
ALT: 36 U/L (ref 0–44)
AST: 28 U/L (ref 15–41)
Albumin: 3.7 g/dL (ref 3.5–5.0)
Alkaline Phosphatase: 105 U/L (ref 38–126)
Bilirubin, Direct: 0.6 mg/dL — ABNORMAL HIGH (ref 0.0–0.2)
Indirect Bilirubin: 1.3 mg/dL — ABNORMAL HIGH (ref 0.3–0.9)
Total Bilirubin: 1.9 mg/dL — ABNORMAL HIGH (ref 0.3–1.2)
Total Protein: 7.3 g/dL (ref 6.5–8.1)

## 2019-05-07 LAB — TSH: TSH: 1.237 u[IU]/mL (ref 0.350–4.500)

## 2019-05-07 LAB — TROPONIN I (HIGH SENSITIVITY)
Troponin I (High Sensitivity): 39 ng/L — ABNORMAL HIGH (ref <18)
Troponin I (High Sensitivity): 41 ng/L — ABNORMAL HIGH (ref <18)

## 2019-05-07 LAB — SARS CORONAVIRUS 2 BY RT PCR (HOSPITAL ORDER, PERFORMED IN ~~LOC~~ HOSPITAL LAB): SARS Coronavirus 2: NEGATIVE

## 2019-05-07 LAB — HIV ANTIBODY (ROUTINE TESTING W REFLEX): HIV Screen 4th Generation wRfx: NONREACTIVE

## 2019-05-07 LAB — BRAIN NATRIURETIC PEPTIDE: B Natriuretic Peptide: 3955.9 pg/mL — ABNORMAL HIGH (ref 0.0–100.0)

## 2019-05-07 MED ORDER — CARVEDILOL 12.5 MG PO TABS
12.5000 mg | ORAL_TABLET | Freq: Two times a day (BID) | ORAL | Status: DC
  Administered 2019-05-07 – 2019-05-11 (×8): 12.5 mg via ORAL
  Filled 2019-05-07 (×8): qty 1

## 2019-05-07 MED ORDER — FUROSEMIDE 10 MG/ML IJ SOLN
60.0000 mg | Freq: Two times a day (BID) | INTRAMUSCULAR | Status: DC
  Administered 2019-05-07 – 2019-05-08 (×2): 60 mg via INTRAVENOUS
  Filled 2019-05-07 (×2): qty 6

## 2019-05-07 MED ORDER — PRAZOSIN HCL 1 MG PO CAPS
1.0000 mg | ORAL_CAPSULE | Freq: Every day | ORAL | Status: DC
  Administered 2019-05-07 – 2019-05-11 (×5): 1 mg via ORAL
  Filled 2019-05-07 (×5): qty 1

## 2019-05-07 MED ORDER — SODIUM CHLORIDE 0.9 % IV SOLN
100.0000 mg | Freq: Two times a day (BID) | INTRAVENOUS | Status: DC
  Administered 2019-05-07: 100 mg via INTRAVENOUS
  Filled 2019-05-07 (×2): qty 100

## 2019-05-07 MED ORDER — ACETAMINOPHEN 650 MG RE SUPP: 650.0000 mg | Freq: Four times a day (QID) | RECTAL | Status: DC | PRN

## 2019-05-07 MED ORDER — ASPIRIN EC 81 MG PO TBEC
81.0000 mg | DELAYED_RELEASE_TABLET | Freq: Every day | ORAL | Status: DC
  Administered 2019-05-07 – 2019-05-11 (×4): 81 mg via ORAL
  Filled 2019-05-07 (×4): qty 1

## 2019-05-07 MED ORDER — ONDANSETRON HCL 4 MG/2ML IJ SOLN
4.0000 mg | Freq: Once | INTRAMUSCULAR | Status: AC
  Administered 2019-05-07: 4 mg via INTRAVENOUS
  Filled 2019-05-07: qty 2

## 2019-05-07 MED ORDER — CLOPIDOGREL BISULFATE 75 MG PO TABS
75.0000 mg | ORAL_TABLET | Freq: Every day | ORAL | Status: DC
  Administered 2019-05-07 – 2019-05-11 (×5): 75 mg via ORAL
  Filled 2019-05-07 (×5): qty 1

## 2019-05-07 MED ORDER — FUROSEMIDE 10 MG/ML IJ SOLN
40.0000 mg | Freq: Once | INTRAMUSCULAR | Status: AC
  Administered 2019-05-07: 40 mg via INTRAVENOUS
  Filled 2019-05-07: qty 4

## 2019-05-07 MED ORDER — SPIRONOLACTONE 25 MG PO TABS
25.0000 mg | ORAL_TABLET | Freq: Every day | ORAL | Status: DC
  Administered 2019-05-07 – 2019-05-11 (×5): 25 mg via ORAL
  Filled 2019-05-07 (×5): qty 1

## 2019-05-07 MED ORDER — ENOXAPARIN SODIUM 60 MG/0.6ML ~~LOC~~ SOLN
50.0000 mg | SUBCUTANEOUS | Status: DC
  Administered 2019-05-07 – 2019-05-09 (×3): 50 mg via SUBCUTANEOUS
  Filled 2019-05-07: qty 0.5
  Filled 2019-05-07 (×2): qty 0.6

## 2019-05-07 MED ORDER — ATORVASTATIN CALCIUM 40 MG PO TABS
80.0000 mg | ORAL_TABLET | Freq: Every day | ORAL | Status: DC
  Administered 2019-05-07 – 2019-05-11 (×5): 80 mg via ORAL
  Filled 2019-05-07 (×5): qty 2

## 2019-05-07 MED ORDER — LOSARTAN POTASSIUM 25 MG PO TABS
25.0000 mg | ORAL_TABLET | Freq: Every day | ORAL | Status: DC
  Administered 2019-05-07 – 2019-05-08 (×2): 25 mg via ORAL
  Filled 2019-05-07 (×2): qty 1

## 2019-05-07 MED ORDER — ISOSORB DINITRATE-HYDRALAZINE 20-37.5 MG PO TABS
1.0000 | ORAL_TABLET | Freq: Three times a day (TID) | ORAL | Status: DC
  Administered 2019-05-07 – 2019-05-11 (×12): 1 via ORAL
  Filled 2019-05-07 (×14): qty 1

## 2019-05-07 MED ORDER — ACETAMINOPHEN 325 MG PO TABS: 650.0000 mg | ORAL_TABLET | Freq: Four times a day (QID) | ORAL | Status: DC | PRN

## 2019-05-07 NOTE — ED Notes (Signed)
Bed: IH47 Expected date:  Expected time:  Means of arrival:  Comments: 58 yo M/CHF exacerbation

## 2019-05-07 NOTE — ED Notes (Signed)
X-ray at bedside

## 2019-05-07 NOTE — Consult Note (Signed)
Cardiology Consultation:   Patient ID: Dale [name] MRN: [id number]; DOB: 1961-09-08  Admit date: 05/07/2019 Date of Consult: 05/07/2019  Primary Care Provider: Patient, No Pcp Per Primary Cardiologist: Kristeen Miss, MD   Patient Profile:   Dale [name] is a 58 y.o. male with a hx of systolic CHF who is being seen today for the evaluation of CHF at the request of Dr Toniann Fail  History of Present Illness:   Dale [name] is a 58 yo with hxof systolic CHF (LVEF 25%), HTN, cocaine and EtOH abuse, noncompliance. He has had multiple ED visits and hospitalizations for CHF   Last admit was in April 2020  He was offered heart catheterization but declined   Seen by Katherina Right on Mar 18 2019  Has been to ED 3 x since for SOB, CHF (last 04/28/19 Pt presents with 4 days of N/V and SOB     The pt says he has been trying to watch his salt intake   Denies CP   Denies cocaine use   Says his breathing is some better now    Past Medical History:  Diagnosis Date  . Alcohol abuse   . Chronic combined systolic and diastolic CHF (congestive heart failure) (HCC)    a. 09/27/18 showed mild LVH, EF 20-25%, grade 2 DD, mild MR, severely dilated LA, mildly dilated RV with mildly reduced RV function, mod RAE.  Marland Kitchen Cocaine abuse (HCC)   . Hypertension     History reviewed. No pertinent surgical history.     Inpatient Medications: Scheduled Meds: . aspirin  81 mg Oral Daily  . atorvastatin  80 mg Oral Daily  . carvedilol  12.5 mg Oral BID WC  . clopidogrel  75 mg Oral Daily  . enoxaparin (LOVENOX) injection  40 mg Subcutaneous Q24H  . furosemide  60 mg Intravenous Q12H  . isosorbide-hydrALAZINE  1 tablet Oral TID  . losartan  25 mg Oral Daily  . prazosin  1 mg Oral Daily  . spironolactone  25 mg Oral Daily   Continuous Infusions: . doxycycline (VIBRAMYCIN) IV     PRN Meds: acetaminophen **OR** acetaminophen  Allergies:    Allergies  Allergen Reactions  . Hydrocodone Itching    Social History:    Social History   Socioeconomic History  . Marital status: Single    Spouse name: Not on file  . Number of children: Not on file  . Years of education: Not on file  . Highest education level: Not on file  Occupational History  . Not on file  Social Needs  . Financial resource strain: Not on file  . Food insecurity    Worry: Not on file    Inability: Not on file  . Transportation needs    Medical: Not on file    Non-medical: Not on file  Tobacco Use  . Smoking status: Never Smoker  . Smokeless tobacco: Never Used  Substance and Sexual Activity  . Alcohol use: Yes    Comment: 2 quarts a week   . Drug use: Yes    Frequency: 3.0 times per week    Types: Marijuana, Cocaine  . Sexual activity: Not Currently  Lifestyle  . Physical activity    Days per week: Not on file    Minutes per session: Not on file  . Stress: Not on file  Relationships  . Social Musician on phone: Not on file    Gets together: Not on file    Attends  religious service: Not on file    Active member of club or organization: Not on file    Attends meetings of clubs or organizations: Not on file    Relationship status: Not on file  . Intimate partner violence    Fear of current or ex partner: Not on file    Emotionally abused: Not on file    Physically abused: Not on file    Forced sexual activity: Not on file  Other Topics Concern  . Not on file  Social History Narrative  . Not on file    Family History:    Family History  Problem Relation Age of Onset  . Hypertension Maternal Grandmother      ROS:  Please see the history of present illness.   All other ROS reviewed and negative.     Physical Exam/Data:   Vitals:   05/07/19 0440 05/07/19 0546 05/07/19 0630 05/07/19 0736  BP:  (!) 145/105 (!) 156/104 (!) 138/112  Pulse:  95 100 (!) 102  Resp:  18 20 (!) 21  Temp:      TempSrc:      SpO2:  98% 99% 99%  Weight: 104.3 kg     Height: 5\' 11"  (1.803 m)      No intake or  output data in the 24 hours ending 05/07/19 0802 Last 3 Weights 05/07/2019 04/28/2019 03/27/2019  Weight (lbs) 230 lb 230 lb 225 lb  Weight (kg) 104.327 kg 104.327 kg 102.059 kg     Body mass index is 32.08 kg/m.  General:  Obese 58 yo , in no acute distress HEENT: normal Lymph: no adenopathy Neck: JVP is increased   Endocrine:  No thryomegaly Vascular: No carotid bruits Cardiac:  normal S1, S2; RRR; no murmur  Lungs:  Relatively clear to auscultation bilaterally,  Mild rales at R base   Abd: soft, nontender, no hepatomegaly  Ext: Triv edema Musculoskeletal:  No deformities, BUE and BLE strength normal and equal Skin: warm and dry  Neuro:  CNs 2-12 intact, no focal abnormalities noted Psych:  Normal affect   EKG:  The EKG was personally reviewed and demonstrates:  SR 96 bpm  T wave inversion inferolaterally   Prolonged QT interval  (Twave changes old)  Telemetry:  Telemetry was personally reviewed and demonstrates:  SR    CV studies    03/11/2019  Echo  1. The left ventricle has severely reduced systolic function, with an ejection fraction of 20-25%. The cavity size was moderately dilated. There is mildly increased left ventricular wall thickness. Left ventricular diastolic Doppler parameters are  consistent with restrictive filling. Elevated left ventricular end-diastolic pressure.  2. Diffuse hypokinesis worse in the inferior wall.  3. The right ventricle has normal systolic function. The cavity was moderately enlarged. There is no increase in right ventricular wall thickness.  4. Right atrial size was mildly dilated.  5. Mild thickening of the mitral valve leaflet.  6. The aortic valve is tricuspid. Mild thickening of the aortic valve. Mild calcification of the aortic valve.  FINDINGS  Left Ventricle: The left ventricle has severely reduced systolic function, with an ejection fraction of 20-25%. The cavity size was moderately dilated. There is mildly increased left ventricular  wall thickness. Left ventricular diastolic Doppler  parameters are consistent with restrictive filling. Elevated left ventricular end-diastolic pressure Diffuse hypokinesis worse in the inferior wall.  Right Ventricle: The right ventricle has normal systolic function. The cavity was moderately enlarged. There is no increase in right  ventricular wall thickness.  Left Atrium: Left atrial size was normal in size.  Right Atrium: Right atrial size was mildly dilated. Right atrial pressure is estimated at 10 mmHg.  Interatrial Septum: No atrial level shunt detected by color flow Doppler.  Pericardium: There is no evidence of pericardial effusion.  Mitral Valve: The mitral valve is normal in structure. Mild thickening of the mitral valve leaflet. Mitral valve regurgitation is mild by color flow Doppler.  Tricuspid Valve: The tricuspid valve is normal in structure. Tricuspid valve regurgitation is mild by color flow Doppler.  Aortic Valve: The aortic valve is tricuspid Mild thickening of the aortic valve. Mild calcification of the aortic valve. Aortic valve regurgitation was not visualized by color flow Doppler. There is no evidence of aortic valve stenosis.  Pulmonic Valve: The pulmonic valve was grossly normal. Pulmonic valve regurgitation is mild by color flow Doppler.  Venous: The inferior vena cava is normal in size with greater than 50% respiratory variability.    Charlton HawsPeter Nishan MD Electronically signed by Charlton HawsPeter Nishan MD Signature Date/Time: 03/11/2019/10:26:51 AM  Laboratory Data:  High Sensitivity Troponin:   Recent Labs  Lab 05/07/19 0646  TROPONINIHS 39*     Cardiac EnzymesNo results for input(s): TROPONINI in the last 168 hours. No results for input(s): TROPIPOC in the last 168 hours.  Chemistry Recent Labs  Lab 05/07/19 0353 05/07/19 0646  NA 138 136  K 4.1 3.3*  CL 100 101  CO2 25 25  GLUCOSE 120* 132*  BUN 30* 32*  CREATININE 1.31* 1.41*  CALCIUM 8.9  8.6*  GFRNONAA >60 56*  GFRAA >60 >60  ANIONGAP 13 10    Recent Labs  Lab 05/07/19 0353 05/07/19 0646  PROT 7.4 7.3  ALBUMIN 3.7 3.7  AST 37 28  ALT 39 36  ALKPHOS 111 105  BILITOT 2.0* 1.9*   Hematology Recent Labs  Lab 05/07/19 0353 05/07/19 0646  WBC 7.5 7.8  RBC 4.38 4.26  HGB 12.0* 11.8*  HCT 36.5* 35.7*  MCV 83.3 83.8  MCH 27.4 27.7  MCHC 32.9 33.1  RDW 16.0* 16.1*  PLT 331 328   BNP Recent Labs  Lab 05/07/19 0353  BNP 3,955.9*    DDimer No results for input(s): DDIMER in the last 168 hours.   Radiology/Studies:  Dg Chest Port 1 View  Result Date: 05/07/2019 CLINICAL DATA:  Shortness of breath.  Lower extremity swelling. EXAM: PORTABLE CHEST 1 VIEW COMPARISON:  Chest x-ray dated April 28, 2019. FINDINGS: Stable cardiomegaly. Normal mediastinal contours. Unchanged mild pulmonary vascular congestion. No focal consolidation, pleural effusion, or pneumothorax. No acute osseous abnormality. IMPRESSION: Unchanged mild pulmonary vascular congestion without overt edema. Electronically Signed   By: Obie DredgeWilliam T Derry M.D.   On: 05/07/2019 04:24   Ct Renal Stone Study  Result Date: 05/07/2019 CLINICAL DATA:  Nausea and vomiting. EXAM: CT ABDOMEN AND PELVIS WITHOUT CONTRAST TECHNIQUE: Multidetector CT imaging of the abdomen and pelvis was performed following the standard protocol without IV contrast. COMPARISON:  None. FINDINGS: Lower chest: No acute abnormality.  Cardiomegaly. Hepatobiliary: No focal liver abnormality is seen. No gallstones, gallbladder wall thickening, or biliary dilatation. Pancreas: Unremarkable. No pancreatic ductal dilatation or surrounding inflammatory changes. Spleen: Normal in size without focal abnormality. Adrenals/Urinary Tract: Adrenal glands are unremarkable. Kidneys are normal, without renal calculi, focal lesion, or hydronephrosis. Bladder is unremarkable. Stomach/Bowel: Stomach is within normal limits. Appendix appears normal. No evidence of bowel  wall thickening, distention, or inflammatory changes. Vascular/Lymphatic: Ectatic infrarenal abdominal aorta measuring  up to 2.9 cm. Aortic atherosclerosis. No enlarged abdominal or pelvic lymph nodes. Reproductive: Prostate is unremarkable. Other: Trace perihepatic and pelvic ascites.  No pneumoperitoneum. Musculoskeletal: Mild anasarca. No acute or significant osseous findings. IMPRESSION: 1.  No acute intra-abdominal process. 2. Trace ascites.  Mild anasarca. 3. Ectatic abdominal aorta at risk for aneurysm development. Recommend followup by ultrasound in 5 years. This recommendation follows ACR consensus guidelines: White Paper of the ACR Incidental Findings Committee II on Vascular Findings. J Am Coll Radiol 2013; 10:789-794. Aortic aneurysm NOS (ICD10-I71.9) 4.  Aortic atherosclerosis (ICD10-I70.0). Electronically Signed   By: Titus Dubin M.D.   On: 05/07/2019 06:46    Assessment and Plan:   1. Acute on chronic systolic CHF   PT has had multiple admissions and ER vists for CHF   Presents again.   ? Noncompliance with meds  / diet      I have discussed with the pt   He has evidence of atherosclerosis of aorta on CT scan   I think it would be important to do a R and L heart cath to define anatomy/pressures; rule out any potentially treatable causes for CHF    At first the pt refused  Then he said he would do it on Monday  Would recomm R and L to define anatomy    WIll keep NPO on 7/6  Add to board  Hold on orders for now  2  HTN  Follow BP with diuresis  3   PRolonged QT  Keep electrolytes maintained to near normal; Avoid drugs that prolong QT   Follow EKGs  4 CKD  FOllow Cr closely with diuresis  May need to back off    5  PAD  PT with atherosclerosis of aorta  Continue statin  6  Substance abuse  COunselled on importance of avoiding cocaine, EToH given hx of CHF   For questions or updates, please contact [facility] Please consult [email or web address] for contact info under     Signed,  Dorris Carnes, MD  05/07/2019 8:02 AM

## 2019-05-07 NOTE — Progress Notes (Signed)
Admission from earlier this morning with systolic CHF exacerbation.  Respiratory status improved on diuretics.  He has no complaints.  Mild troponin elevation likely demand ischemia.  Patient has no chest pain.  Cardiology involved and planning for [facility] on 7/7.  Discontinued doxycycline.  Left arm does not look infected.  See picture for more.

## 2019-05-07 NOTE — H&P (Addendum)
History and Physical    Dale [name] HUT:[id number] DOB: [birth year] DOA: 05/07/2019  PCP: Patient, No Pcp Per  Patient coming from: Patient is homeless.  Chief Complaint: Shortness of breath nausea vomiting.  HPI: Dale [name] is a 58 y.o. male with history of chronic systolic heart failure last EF measured in May 2020 was 20 to 25% who had a recent non-ST elevation MI, hypertension, chronic kidney disease stage II anemia polysubstance abuse presents to the ER with complaints of worsening shortness of breath over the last few days with increasing lower extremity edema.  Patient states over the last 4 days patient was unable to keep in anything much because of her persistent nausea vomiting.  Denies having abdominal pain or diarrhea.  Denies any fever chills or productive cough.  Patient states he was able to take his medications.  He gets short of breath on exertion but has no chest pain.  Patient also noticed some swelling on the left forearm after he had a spider bite.  ED Course: In the ER on exam patient has bilateral lower extremity edema and abdomen appears benign.  JVD is elevated.  Chest x-ray shows pulmonary congestion.  Lab work show creatinine 1.3 potassium 4.1 hemoglobin 12 BNP 3955.  LFTs are normal.  UA pending COVID-19 was pending.  Patient was given 40 mg IV Lasix and admitted for acute CHF.  Review of Systems: As per HPI, rest all negative.   Past Medical History:  Diagnosis Date  . Alcohol abuse   . Chronic combined systolic and diastolic CHF (congestive heart failure) ([place])    a. 09/27/18 showed mild LVH, EF 20-25%, grade 2 DD, mild MR, severely dilated LA, mildly dilated RV with mildly reduced RV function, mod RAE.  Marland Kitchen Cocaine abuse ([place])   . Hypertension     History reviewed. No pertinent surgical history.   reports that he has never smoked. He has never used smokeless tobacco. He reports current alcohol use. He reports current drug use. Frequency: 3.00 times per  week. Drugs: Marijuana and Cocaine.  Allergies  Allergen Reactions  . Hydrocodone Itching    Family History  Problem Relation Age of Onset  . Hypertension Maternal Grandmother     Prior to Admission medications   Medication Sig Start Date End Date Taking? Authorizing Provider  aspirin EC 81 MG EC tablet Take 1 tablet (81 mg total) by mouth daily. 03/03/19  Yes Cheryln Manly, NP  atorvastatin (LIPITOR) 80 MG tablet Take 1 tablet (80 mg total) by mouth daily. 03/03/19 07/01/19 Yes Cheryln Manly, NP  carvedilol (COREG) 12.5 MG tablet Take 1 tablet (12.5 mg total) by mouth 2 (two) times daily with a meal. 03/13/19  Yes Regalado, Belkys A, MD  clopidogrel (PLAVIX) 75 MG tablet Take 1 tablet (75 mg total) by mouth daily. 03/03/19  Yes Reino Bellis B, NP  diclofenac sodium (VOLTAREN) 1 % GEL Apply 4 g topically 4 (four) times daily. 05/05/19  Yes Kerin Perna, NP  furosemide (LASIX) 40 MG tablet Take 1 tablet (40 mg total) by mouth 3 (three) times daily. For a total of 5 days 04/28/19  Yes Deno Etienne, DO  isosorbide-hydrALAZINE (BIDIL) 20-37.5 MG tablet Take 1 tablet by mouth 3 (three) times daily. 03/13/19  Yes Regalado, Belkys A, MD  losartan (COZAAR) 25 MG tablet Take 1 tablet (25 mg total) by mouth daily. 04/28/19  Yes Deno Etienne, DO  prazosin (MINIPRESS) 1 MG capsule Take 1 capsule (1 mg total) by  mouth at bedtime. Patient taking differently: Take 1 mg by mouth daily.  03/03/19  Yes Laverda Page B, NP  spironolactone (ALDACTONE) 25 MG tablet Take 25 mg by mouth daily.   Yes [provider]  tiZANidine (ZANAFLEX) 4 MG tablet Take 4 mg by mouth 3 (three) times daily as needed for muscle spasms.  04/23/19  Yes [provider]    Physical Exam: Constitutional: Moderately built and nourished. Vitals:   05/07/19 0315 05/07/19 0430 05/07/19 0440 05/07/19 0546  BP: (!) 138/121 (!) 158/116  (!) 145/105  Pulse: 99 96  95  Resp: (!) 23 (!) 25  18  Temp: 98.6 F (37  C)     TempSrc: Oral     SpO2: 100% 98%  98%  Weight:   104.3 kg   Height:   5\' 11"  (1.803 m)    Eyes: Nonicteric no pallor. ENMT: No discharge from the ears eyes nose and mouth. Neck: JVD elevated no mass felt. Respiratory: No rhonchi or crepitations. Cardiovascular: S1-S2 heard. Abdomen: Distended nontender bowel sounds present. Musculoskeletal: Bilateral lower extremity edema present. Skin: Small area of swelling around the left forearm measuring around 1/2 cm.  No pus. Neurologic: Alert awake oriented to time place and person.  Moves all extremities. Psychiatric: Appears normal per normal affect.   Labs on Admission: I have personally reviewed following labs and imaging studies  CBC: Recent Labs  Lab 05/07/19 0353  WBC 7.5  NEUTROABS 5.3  HGB 12.0*  HCT 36.5*  MCV 83.3  PLT 331   Basic Metabolic Panel: Recent Labs  Lab 05/07/19 0353  NA 138  K 4.1  CL 100  CO2 25  GLUCOSE 120*  BUN 30*  CREATININE 1.31*  CALCIUM 8.9  MG 2.2   GFR: Estimated Creatinine Clearance: 78.3 mL/min (A) (by C-G formula based on SCr of 1.31 mg/dL (H)). Liver Function Tests: Recent Labs  Lab 05/07/19 0353  AST 37  ALT 39  ALKPHOS 111  BILITOT 2.0*  PROT 7.4  ALBUMIN 3.7   No results for input(s): LIPASE, AMYLASE in the last 168 hours. No results for input(s): AMMONIA in the last 168 hours. Coagulation Profile: No results for input(s): INR, PROTIME in the last 168 hours. Cardiac Enzymes: No results for input(s): CKTOTAL, CKMB, CKMBINDEX, TROPONINI in the last 168 hours. BNP (last 3 results) No results for input(s): PROBNP in the last 8760 hours. HbA1C: No results for input(s): HGBA1C in the last 72 hours. CBG: No results for input(s): GLUCAP in the last 168 hours. Lipid Profile: No results for input(s): CHOL, HDL, LDLCALC, TRIG, CHOLHDL, LDLDIRECT in the last 72 hours. Thyroid Function Tests: No results for input(s): TSH, T4TOTAL, FREET4, T3FREE, THYROIDAB in the last  72 hours. Anemia Panel: No results for input(s): VITAMINB12, FOLATE, FERRITIN, TIBC, IRON, RETICCTPCT in the last 72 hours. Urine analysis:    Component Value Date/Time   COLORURINE COLORLESS (A) 02/28/2019 1309   APPEARANCEUR CLEAR 02/28/2019 1309   LABSPEC 1.004 (L) 02/28/2019 1309   PHURINE 7.0 02/28/2019 1309   GLUCOSEU NEGATIVE 02/28/2019 1309   HGBUR NEGATIVE 02/28/2019 1309   BILIRUBINUR NEGATIVE 02/28/2019 1309   KETONESUR NEGATIVE 02/28/2019 1309   PROTEINUR NEGATIVE 02/28/2019 1309   UROBILINOGEN 0.2 03/11/2016 0934   NITRITE NEGATIVE 02/28/2019 1309   LEUKOCYTESUR NEGATIVE 02/28/2019 1309   Sepsis Labs: @LABRCNTIP (procalcitonin:4,lacticidven:4) )No results found for this or any previous visit (from the past 240 hour(s)).   Radiological Exams on Admission: Dg Chest [facility]  Result Date: 05/07/2019 CLINICAL DATA:  Shortness of breath.  Lower extremity swelling. EXAM: PORTABLE CHEST 1 VIEW COMPARISON:  Chest x-ray dated April 28, 2019. FINDINGS: Stable cardiomegaly. Normal mediastinal contours. Unchanged mild pulmonary vascular congestion. No focal consolidation, pleural effusion, or pneumothorax. No acute osseous abnormality. IMPRESSION: Unchanged mild pulmonary vascular congestion without overt edema. Electronically Signed   By: Obie DredgeWilliam T Derry M.D.   On: 05/07/2019 04:24    EKG: Independently reviewed.  Normal sinus rhythm with prolonged QTC of 525 ms.  Assessment/Plan Principal Problem:   Acute systolic CHF (congestive heart failure) (HCC) Active Problems:   Essential hypertension   Chronic kidney disease, stage II (mild)   Normochromic anemia   Acute CHF (congestive heart failure) (HCC)   Nausea & vomiting    1. Acute on chronic systolic heart failure last EF measured was 20 to 25% in May 2020 -patient received 40 mg IV Lasix in the ER.  Patient's baseline weight is around 101 kg is around 100 for now.  I have placed patient on Lasix 60 mg IV every 12 will  check troponin closely follow intake output metabolic panel daily weights.  Note that patient is on losartan and spironolactone BiDil he also takes aspirin Plavix.  Because of the cause of the CHF was not clear and previously cardiology has recommended cardiac cath I have consulted cardiology. 2. Nausea vomiting -LFTs are normal and abdomen appears benign.  Due to persistent nausea vomiting I have ordered CT abdomen without contrast.  Follow LFTs and CAT scan results.  UA is pending. 3. Hypertension uncontrolled is on Cozaar prazosin spironolactone carvedilol.  Closely follow blood pressure trends.  On Lasix IV. 4. Chronic kidney disease stage II creatinine is around 1.3.  Patient's creatinine 10 days ago was 1.4.  Note that patient is on Lasix IV now ARB spironolactone which may need to be held based on if creatinine worsens. 5. Normocytic normochromic anemia appears to be chronic.  Follow CBC. 6. Possible left forearm cellulitis for which I have placed patient on doxycycline. 7. Prolonged QTC closely follow metabolic panel remission levels. 8. History of polysubstance abuse including cocaine and alcohol -patient states he has not used any of these for last more than a month.  Urine drug screen is pending. 9. Patient is homeless will get social work consult.   DVT prophylaxis: Lovenox. Code Status: Full code. Family Communication: Discussed with patient. Disposition Plan: Home. Consults called: Cardiology.  Social work. Admission status: Inpatient.   Eduard ClosArshad N Anand Tejada MD [facility] Pager [phone].  If 7PM-7AM, please contact night-coverage [email or web address] Password [email or web address]  05/07/2019, 6:09 AM

## 2019-05-07 NOTE — Plan of Care (Signed)
Nutrition Education Note RD working remotely.  RD consulted for nutrition education regarding new onset CHF.  RD provided "Low Sodium Nutrition Therapy" handout from the Academy of [employer]etics. In discharge summary. RD unsuccessful in reaching patient via phone.Reviewed patient's dietary recall. Provided examples on ways to decrease sodium intake in diet. Discouraged intake of processed foods and use of salt shaker. Encouraged fresh fruits and vegetables as well as whole grain sources of carbohydrates to maximize fiber intake.   RD discussed why it is important for patient to adhere to diet recommendations, and emphasized the role of fluids, foods to avoid, and importance of weighing self daily.   Expect fair compliance.  Body mass index is 35.48 kg/m. Pt meets criteria for obese based on current BMI.  Current diet order liberalized from NPO to [facility]. No recorded meals at this time. Labs and medications reviewed. No further nutrition interventions warranted at this time. RD contact information provided. If additional nutrition issues arise, please re-consult RD.   Lajuan Lines, RD, LDN  After Hours/Weekend Pager: [phone]

## 2019-05-07 NOTE — ED Notes (Signed)
Pt provided warm blanket.

## 2019-05-07 NOTE — ED Notes (Signed)
ED TO INPATIENT HANDOFF REPORT  ED Nurse Name and Phone #:  Algie Westry [phone]  S Name/Age/Gender Dale [name] 58 y.o. male Room/Bed: WA15/WA15  Code Status   Code Status: Full Code  Home/SNF/Other Home Patient oriented to: self, place, time and situation Is this baseline? Yes   Triage Complete: Triage complete  Chief Complaint SOB, CHF  Triage Note Per EMS, patient coming from home with complaints of shortness of breath that is worse with exertion. Swelling noted to bilateral lower extremities. Patient has a history of CHF and states that he would like IV lasix as his oral lasix is "not working fast enough." Patient also states that he has not had anything to eat in days.      Allergies Allergies  Allergen Reactions  . Hydrocodone Itching    Level of Care/Admitting Diagnosis ED Disposition    ED Disposition Condition Comment   Admit  Hospital Area: [facility] [[id number]]  Level of Care: Telemetry [5]  Admit to tele based on following criteria: Acute CHF  Covid Evaluation: Person Under Investigation (PUI)  Diagnosis: Acute CHF (congestive heart failure) [name]) [[id number]]  Admitting Physician: Eduard Clos [phone]  Attending Physician: Eduard Clos [phone]  Estimated length of stay: past midnight tomorrow  Certification:: I certify this patient will need inpatient services for at least 2 midnights  PT Class (Do Not Modify): Inpatient [101]  PT Acc Code (Do Not Modify): Private [1]       B Medical/Surgery History Past Medical History:  Diagnosis Date  . Alcohol abuse   . Chronic combined systolic and diastolic CHF (congestive heart failure) (HCC)    a. 09/27/18 showed mild LVH, EF 20-25%, grade 2 DD, mild MR, severely dilated LA, mildly dilated RV with mildly reduced RV function, mod RAE.  Marland Kitchen Cocaine abuse (HCC)   . Hypertension    History reviewed. No pertinent surgical history.   A IV Location/Drains/Wounds Patient  Lines/Drains/Airways Status   Active Line/Drains/Airways    Name:   Placement date:   Placement time:   Site:   Days:   Peripheral IV 05/07/19 Left Hand   05/07/19    0354    Hand   less than 1          Intake/Output Last 24 hours  Intake/Output Summary (Last 24 hours) at 05/07/2019 1256 Last data filed at 05/07/2019 1142 Gross per 24 hour  Intake 250 ml  Output -  Net 250 ml    Labs/Imaging Results for orders placed or performed during the hospital encounter of 05/07/19 (from the past 48 hour(s))  Comprehensive metabolic panel     Status: Abnormal   Collection Time: 05/07/19  3:53 AM  Result Value Ref Range   Sodium 138 135 - 145 mmol/L   Potassium 4.1 3.5 - 5.1 mmol/L   Chloride 100 98 - 111 mmol/L   CO2 25 22 - 32 mmol/L   Glucose, Bld 120 (H) 70 - 99 mg/dL   BUN 30 (H) 6 - 20 mg/dL   Creatinine, Ser 8.63 (H) 0.61 - 1.24 mg/dL   Calcium 8.9 8.9 - 81.7 mg/dL   Total Protein 7.4 6.5 - 8.1 g/dL   Albumin 3.7 3.5 - 5.0 g/dL   AST 37 15 - 41 U/L   ALT 39 0 - 44 U/L   Alkaline Phosphatase 111 38 - 126 U/L   Total Bilirubin 2.0 (H) 0.3 - 1.2 mg/dL   GFR calc non Af Amer >60 >60 mL/min  GFR calc Af Amer >60 >60 mL/min   Anion gap 13 5 - 15    Comment: Performed at [facility], [address] [place], [place] [address]  Brain natriuretic peptide     Status: Abnormal   Collection Time: 05/07/19  3:53 AM  Result Value Ref Range   B Natriuretic Peptide 3,955.9 (H) 0.0 - 100.0 pg/mL    Comment: Performed at [facility], [address] [place], [place] [address]  CBC with Differential     Status: Abnormal   Collection Time: 05/07/19  3:53 AM  Result Value Ref Range   WBC 7.5 4.0 - 10.5 K/uL   RBC 4.38 4.22 - 5.81 MIL/uL   Hemoglobin 12.0 (L) 13.0 - 17.0 g/dL   HCT 09.836.5 (L) 11.939.0 - 14.752.0 %   MCV 83.3 80.0 - 100.0 fL   MCH 27.4 26.0 - 34.0 pg   MCHC 32.9 30.0 - 36.0 g/dL   RDW 82.916.0 (H) 56.211.5 - 13.015.5 %   Platelets 331 150 - 400 K/uL    nRBC 0.0 0.0 - 0.2 %   Neutrophils Relative % 72 %   Neutro Abs 5.3 1.7 - 7.7 K/uL   Lymphocytes Relative 17 %   Lymphs Abs 1.3 0.7 - 4.0 K/uL   Monocytes Relative 11 %   Monocytes Absolute 0.8 0.1 - 1.0 K/uL   Eosinophils Relative 0 %   Eosinophils Absolute 0.0 0.0 - 0.5 K/uL   Basophils Relative 0 %   Basophils Absolute 0.0 0.0 - 0.1 K/uL   Immature Granulocytes 0 %   Abs Immature Granulocytes 0.03 0.00 - 0.07 K/uL    Comment: Performed at [facility], [address] [place], [place] [address]  Magnesium     Status: None   Collection Time: 05/07/19  3:53 AM  Result Value Ref Range   Magnesium 2.2 1.7 - 2.4 mg/dL    Comment: Performed at [facility], [address] [place], [place] [address]  SARS Coronavirus 2 (CEPHEID - Performed in [facility] hospital lab), Hosp Order     Status: None   Collection Time: 05/07/19  6:01 AM   Specimen: Nasopharyngeal Swab  Result Value Ref Range   SARS Coronavirus 2 NEGATIVE NEGATIVE    Comment: (NOTE) If result is NEGATIVE SARS-CoV-2 target nucleic acids are NOT DETECTED. The SARS-CoV-2 RNA is generally detectable in upper and lower  respiratory specimens during the acute phase of infection. The lowest  concentration of SARS-CoV-2 viral copies this assay can detect is 250  copies / mL. A negative result does not preclude SARS-CoV-2 infection  and should not be used as the sole basis for treatment or other  patient management decisions.  A negative result may occur with  improper specimen collection / handling, submission of specimen other  than nasopharyngeal swab, presence of viral mutation(s) within the  areas targeted by this assay, and inadequate number of viral copies  (<250 copies / mL). A negative result must be combined with clinical  observations, patient history, and epidemiological information. If result is POSITIVE SARS-CoV-2 target nucleic acids are DETECTED. The SARS-CoV-2 RNA is  generally detectable in upper and lower  respiratory specimens dur ing the acute phase of infection.  Positive  results are indicative of active infection with SARS-CoV-2.  Clinical  correlation with patient history and other diagnostic information is  necessary to determine patient infection status.  Positive results do  not rule out bacterial infection or  co-infection with other viruses. If result is PRESUMPTIVE POSTIVE SARS-CoV-2 nucleic acids MAY BE PRESENT.   A presumptive positive result was obtained on the submitted specimen  and confirmed on repeat testing.  While 2019 novel coronavirus  (SARS-CoV-2) nucleic acids may be present in the submitted sample  additional confirmatory testing may be necessary for epidemiological  and / or clinical management purposes  to differentiate between  SARS-CoV-2 and other Sarbecovirus currently known to infect humans.  If clinically indicated additional testing with an alternate test  methodology [phone]([id number]) is advised. The SARS-CoV-2 RNA is generally  detectable in upper and lower respiratory sp ecimens during the acute  phase of infection. The expected result is Negative. Fact Sheet for Patients:  BoilerBrush.com.cyhttps://www.fda.gov/media/136312/download Fact Sheet for Healthcare Providers: https://pope.com/https://www.fda.gov/media/136313/download This test is not yet approved or cleared by the [place] FDA and has been authorized for detection and/or diagnosis of SARS-CoV-2 by FDA under an Emergency Use Authorization (EUA).  This EUA will remain in effect (meaning this test can be used) for the duration of the COVID-19 declaration under Section 564(b)(1) of the Act, 21 U.S.C. section 360bbb-3(b)(1), unless the authorization is terminated or revoked sooner. Performed at [facility], [address] [place], [place] [address]   Basic metabolic panel     Status: Abnormal   Collection Time: 05/07/19  6:46 AM  Result Value Ref Range   Sodium 136  135 - 145 mmol/L   Potassium 3.3 (L) 3.5 - 5.1 mmol/L    Comment: DELTA CHECK NOTED REPEATED TO VERIFY    Chloride 101 98 - 111 mmol/L   CO2 25 22 - 32 mmol/L   Glucose, Bld 132 (H) 70 - 99 mg/dL   BUN 32 (H) 6 - 20 mg/dL   Creatinine, Ser 8.111.41 (H) 0.61 - 1.24 mg/dL   Calcium 8.6 (L) 8.9 - 10.3 mg/dL   GFR calc non Af Amer 56 (L) >60 mL/min   GFR calc Af Amer >60 >60 mL/min   Anion gap 10 5 - 15    Comment: Performed at [facility], [address] [place], [place] [address]  Hepatic function panel     Status: Abnormal   Collection Time: 05/07/19  6:46 AM  Result Value Ref Range   Total Protein 7.3 6.5 - 8.1 g/dL   Albumin 3.7 3.5 - 5.0 g/dL   AST 28 15 - 41 U/L   ALT 36 0 - 44 U/L   Alkaline Phosphatase 105 38 - 126 U/L   Total Bilirubin 1.9 (H) 0.3 - 1.2 mg/dL   Bilirubin, Direct 0.6 (H) 0.0 - 0.2 mg/dL   Indirect Bilirubin 1.3 (H) 0.3 - 0.9 mg/dL    Comment: Performed at [facility], [address] [place], [place] [address]  Magnesium     Status: None   Collection Time: 05/07/19  6:46 AM  Result Value Ref Range   Magnesium 2.3 1.7 - 2.4 mg/dL    Comment: Performed at [facility], [address] [place], [place] [address]  TSH     Status: None   Collection Time: 05/07/19  6:46 AM  Result Value Ref Range   TSH 1.237 0.350 - 4.500 uIU/mL    Comment: Performed by a 3rd Generation assay with a functional sensitivity of <=0.01 uIU/mL. Performed at [facility], [address] [place], [place] [address]   CBC WITH DIFFERENTIAL     Status: Abnormal   Collection Time: 05/07/19  6:46 AM  Result Value Ref Range   WBC 7.8 4.0 - 10.5 K/uL   RBC 4.26 4.22 - 5.81 MIL/uL   Hemoglobin 11.8 (L) 13.0 - 17.0 g/dL   HCT 35.7 (L) 39.0 - 52.0 %   MCV 83.8 80.0 - 100.0 fL   MCH 27.7 26.0 - 34.0 pg   MCHC 33.1 30.0 - 36.0 g/dL   RDW 16.1 (H) 11.5 - 15.5 %   Platelets 328 150 - 400 K/uL   nRBC 0.0 0.0 - 0.2  %   Neutrophils Relative % 65 %   Neutro Abs 5.1 1.7 - 7.7 K/uL   Lymphocytes Relative 22 %   Lymphs Abs 1.7 0.7 - 4.0 K/uL   Monocytes Relative 12 %   Monocytes Absolute 0.9 0.1 - 1.0 K/uL   Eosinophils Relative 0 %   Eosinophils Absolute 0.0 0.0 - 0.5 K/uL   Basophils Relative 1 %   Basophils Absolute 0.0 0.0 - 0.1 K/uL   Immature Granulocytes 0 %   Abs Immature Granulocytes 0.03 0.00 - 0.07 K/uL    Comment: Performed at [facility], [place] [address] [place], [place] [address]  Troponin I (High Sensitivity)     Status: Abnormal   Collection Time: 05/07/19  6:46 AM  Result Value Ref Range   Troponin I (High Sensitivity) 39 (H) <18 ng/L    Comment: (NOTE) Elevated high sensitivity troponin I (hsTnI) values and significant  changes across serial measurements may suggest ACS but many other  chronic and acute conditions are known to elevate hsTnI results.  Refer to the Links section for chest pain algorithms and additional  guidance. Performed at [facility], [place] [address] [place], [place] [address]   Troponin I (High Sensitivity)     Status: Abnormal   Collection Time: 05/07/19  6:46 AM  Result Value Ref Range   Troponin I (High Sensitivity) 41.0 (H) <18 ng/L    Comment: (NOTE) Elevated high sensitivity troponin I (hsTnI) values and significant  changes across serial measurements may suggest ACS but many other  chronic and acute conditions are known to elevate hsTnI results.  Refer to the "Links" section for chest pain algorithms and additional  guidance. Performed at [facility], [place] [address], [place], [place] [address]    Dg Chest Port 1 View  Result Date: 05/07/2019 CLINICAL DATA:  Shortness of breath.  Lower extremity swelling. EXAM: PORTABLE CHEST 1 VIEW COMPARISON:  Chest x-ray dated April 28, 2019. FINDINGS: Stable cardiomegaly. Normal mediastinal contours. Unchanged mild pulmonary vascular congestion. No  focal consolidation, pleural effusion, or pneumothorax. No acute osseous abnormality. IMPRESSION: Unchanged mild pulmonary vascular congestion without overt edema. Electronically Signed   By: Titus Dubin M.D.   On: 05/07/2019 04:24   Ct Renal Stone Study  Result Date: 05/07/2019 CLINICAL DATA:  Nausea and vomiting. EXAM: CT ABDOMEN AND PELVIS WITHOUT CONTRAST TECHNIQUE: Multidetector CT imaging of the abdomen and pelvis was performed following the standard protocol without IV contrast. COMPARISON:  None. FINDINGS: Lower chest: No acute abnormality.  Cardiomegaly. Hepatobiliary: No focal liver abnormality is seen. No gallstones, gallbladder wall thickening, or biliary dilatation. Pancreas: Unremarkable. No pancreatic ductal dilatation or surrounding inflammatory changes. Spleen: Normal in size without focal abnormality. Adrenals/Urinary Tract: Adrenal glands are unremarkable. Kidneys are normal, without renal calculi, focal lesion, or hydronephrosis. Bladder is unremarkable. Stomach/Bowel: Stomach is within normal limits. Appendix appears normal. No evidence of bowel wall thickening, distention, or inflammatory changes. Vascular/Lymphatic: Ectatic  infrarenal abdominal aorta measuring up to 2.9 cm. Aortic atherosclerosis. No enlarged abdominal or pelvic lymph nodes. Reproductive: Prostate is unremarkable. Other: Trace perihepatic and pelvic ascites.  No pneumoperitoneum. Musculoskeletal: Mild anasarca. No acute or significant osseous findings. IMPRESSION: 1.  No acute intra-abdominal process. 2. Trace ascites.  Mild anasarca. 3. Ectatic abdominal aorta at risk for aneurysm development. Recommend followup by ultrasound in 5 years. This recommendation follows ACR consensus guidelines: White Paper of the ACR Incidental Findings Committee II on Vascular Findings. J Am Coll Radiol 2013; 10:789-794. Aortic aneurysm NOS (ICD10-I71.9) 4.  Aortic atherosclerosis (ICD10-I70.0). Electronically Signed   By: Obie DredgeWilliam T Derry  M.D.   On: 05/07/2019 06:46    Pending Labs Unresulted Labs (From admission, onward)    Start     Ordered   05/14/19 0500  Creatinine, serum  (enoxaparin (LOVENOX)    CrCl >/= 30 ml/min)  Weekly,   R    Comments: while on enoxaparin therapy    05/07/19 0607   05/07/19 0606  HIV antibody (Routine Testing)  Once,   STAT     05/07/19 0607   05/07/19 0606  CBC  (enoxaparin (LOVENOX)    CrCl >/= 30 ml/min)  Once,   STAT    Comments: Baseline for enoxaparin therapy IF NOT ALREADY DRAWN.  Notify MD if PLT < 100 K.    05/07/19 0607          Vitals/Pain Today's Vitals   05/07/19 1104 05/07/19 1130 05/07/19 1200 05/07/19 1230  BP:  91/65 (!) 88/58 98/65  Pulse:  84 82 83  Resp:  (!) 23 (!) 21 (!) 26  Temp:      TempSrc:      SpO2: 97% 92% (!) 89% 98%  Weight:      Height:      PainSc:        Isolation Precautions Droplet and Contact precautions  Medications Medications  aspirin EC tablet 81 mg (81 mg Oral Given 05/07/19 0846)  atorvastatin (LIPITOR) tablet 80 mg (80 mg Oral Given 05/07/19 0847)  carvedilol (COREG) tablet 12.5 mg (12.5 mg Oral Given 05/07/19 0824)  isosorbide-hydrALAZINE (BIDIL) 20-37.5 MG per tablet 1 tablet (1 tablet Oral Given 05/07/19 0848)  losartan (COZAAR) tablet 25 mg (25 mg Oral Given 05/07/19 0848)  prazosin (MINIPRESS) capsule 1 mg (1 mg Oral Given 05/07/19 0846)  spironolactone (ALDACTONE) tablet 25 mg (25 mg Oral Given 05/07/19 0848)  clopidogrel (PLAVIX) tablet 75 mg (75 mg Oral Given 05/07/19 0847)  acetaminophen (TYLENOL) tablet 650 mg (has no administration in time range)    Or  acetaminophen (TYLENOL) suppository 650 mg (has no administration in time range)  enoxaparin (LOVENOX) injection 50 mg (50 mg Subcutaneous Given 05/07/19 0850)  furosemide (LASIX) injection 60 mg (has no administration in time range)  doxycycline (VIBRAMYCIN) 100 mg in sodium chloride 0.9 % 250 mL IVPB ( Intravenous Stopped 05/07/19 1033)  furosemide (LASIX) injection 40 mg (40 mg  Intravenous Given 05/07/19 0354)  ondansetron (ZOFRAN) injection 4 mg (4 mg Intravenous Given 05/07/19 0354)    Mobility walks Low fall risk   Focused Assessments Pulmonary Assessment Handoff:  Lung sounds: Bilateral Breath Sounds: Clear L Breath Sounds: Clear R Breath Sounds: Clear O2 Device: Room Air        R Recommendations: See Admitting Provider Note  Report given to:   Additional Notes:  Pt doesn't like "city water". Takes meds with cranberry juice.

## 2019-05-07 NOTE — ED Notes (Signed)
Pt placed on 2L [place]

## 2019-05-07 NOTE — ED Notes (Signed)
Pt's meal tray placed bedside. Pt was asleep. O2 had been removed, but sats were at 98% with RR of 12, so pt left on RA.

## 2019-05-07 NOTE — ED Provider Notes (Signed)
[facility] DEPT Provider Note   CSN: [id number] Arrival date & time: 05/07/19  0301  History   Chief Complaint Chief Complaint  Patient presents with  . Shortness of Breath    HPI Dale [name] is a 58 y.o. male.   The history is provided by the patient.  Shortness of Breath He has history of combined systolic and diastolic heart failure, hypertension, hyperlipidemia, cocaine abuse, non-STEMI and comes in because of ongoing problems with shortness of breath and leg swelling.  He was seen in the emergency department about a week ago and advised to take furosemide 40 mg 3 times a day.  He was doing that but states that he was hardly urinating at all.  He tried taking 2 of the furosemide tablets at 1 time and with very little urine output following that.  He denies chest pain, heaviness, tightness, pressure.  His dyspnea is worse with exertion, not affected by laying flat.  He denies chest pain, heaviness, tightness, pressure.  Also, he states that he has been vomiting after eating and has not been able to anything down for the last 3 days.  He denies exposure to anyone with known COVID-19.  Past Medical History:  Diagnosis Date  . Alcohol abuse   . Chronic combined systolic and diastolic CHF (congestive heart failure) ([place])    a. 09/27/18 showed mild LVH, EF 20-25%, grade 2 DD, mild MR, severely dilated LA, mildly dilated RV with mildly reduced RV function, mod RAE.  Marland Kitchen Cocaine abuse ([place])   . Hypertension     Patient Active Problem List   Diagnosis Date Noted  . Acute exacerbation of CHF (congestive heart failure) ([place]) 03/11/2019  . Troponin level elevated 03/11/2019  . Hyperlipidemia 03/03/2019  . NSVT (nonsustained ventricular tachycardia) ([place]) 03/03/2019  . Anxiety and depression   . NSTEMI (non-ST elevated myocardial infarction) ([place]) 02/28/2019  . Acute systolic heart failure ([place]) 11/08/2018  . Hypertensive urgency 09/28/2018  . Acute  pulmonary edema ([place]) 09/28/2018  . Cocaine abuse ([place]) 09/28/2018  . Substance abuse ([place]) 09/28/2018  . Chronic kidney disease, stage II (mild) 09/28/2018  . Normochromic anemia 09/28/2018  . Acute systolic CHF (congestive heart failure) ([place]) 09/27/2018  . Essential hypertension 03/11/2016  . Obesity 03/11/2016  . Glucosuria 03/11/2016    History reviewed. No pertinent surgical history.      Home Medications    Prior to Admission medications   Medication Sig Start Date End Date Taking? Authorizing Provider  aspirin EC 81 MG EC tablet Take 1 tablet (81 mg total) by mouth daily. 03/03/19   Cheryln Manly, NP  atorvastatin (LIPITOR) 80 MG tablet Take 1 tablet (80 mg total) by mouth daily. 03/03/19 07/01/19  Cheryln Manly, NP  carvedilol (COREG) 12.5 MG tablet Take 1 tablet (12.5 mg total) by mouth 2 (two) times daily with a meal. 03/13/19   Regalado, Belkys A, MD  clopidogrel (PLAVIX) 75 MG tablet Take 1 tablet (75 mg total) by mouth daily. 03/03/19   Cheryln Manly, NP  diclofenac (VOLTAREN) 75 MG EC tablet Take 1 tablet (75 mg total) by mouth 2 (two) times daily. 05/05/19   Kerin Perna, NP  diclofenac sodium (VOLTAREN) 1 % GEL Apply 4 g topically 4 (four) times daily. 05/05/19   Kerin Perna, NP  furosemide (LASIX) 40 MG tablet Take 1 tablet (40 mg total) by mouth 3 (three) times daily. For a total of 5 days 04/28/19   Deno Etienne,  DO  isosorbide-hydrALAZINE (BIDIL) 20-37.5 MG tablet Take 1 tablet by mouth 3 (three) times daily. 03/13/19   Regalado, Belkys A, MD  losartan (COZAAR) 25 MG tablet Take 1 tablet (25 mg total) by mouth daily. 04/28/19   Melene Plan, DO  prazosin (MINIPRESS) 1 MG capsule Take 1 capsule (1 mg total) by mouth at bedtime. 03/03/19   Arty Baumgartner, NP  spironolactone (ALDACTONE) 25 MG tablet Take 25 mg by mouth daily.    [provider]  tiZANidine (ZANAFLEX) 4 MG tablet Take 4 mg by mouth 3 (three) times daily as needed. 04/23/19    [provider]    Family History Family History  Problem Relation Age of Onset  . Hypertension Maternal Grandmother     Social History Social History   Tobacco Use  . Smoking status: Never Smoker  . Smokeless tobacco: Never Used  Substance Use Topics  . Alcohol use: Yes    Comment: 2 quarts a week   . Drug use: Yes    Frequency: 3.0 times per week    Types: Marijuana, Cocaine     Allergies   Hydrocodone   Review of Systems Review of Systems  Respiratory: Positive for shortness of breath.   All other systems reviewed and are negative.    Physical Exam Updated Vital Signs BP (!) 138/121 (BP Location: Left Arm)   Pulse 99   Temp 98.6 F (37 C) (Oral)   Resp (!) 23   SpO2 100%   Physical Exam Vitals signs and nursing note reviewed.    58 year old male, appears mildly dyspneic at rest, but is in no acute distress. Vital signs are significant for elevated blood pressure and respiratory rate. Oxygen saturation is 100%, which is normal. Head is normocephalic and atraumatic. PERRLA, EOMI. Oropharynx is clear. Neck is nontender and supple without adenopathy or JVD. Back is nontender and there is no CVA tenderness. Lungs are clear without rales, wheezes, or rhonchi. Chest is nontender. Heart has regular rate and rhythm without murmur. Abdomen is soft, flat, nontender without masses or hepatosplenomegaly and peristalsis is normoactive. Extremities have 2+ edema, full range of motion is present. Skin is warm and dry without rash. Neurologic: Mental status is normal, cranial nerves are intact, there are no motor or sensory deficits.  ED Treatments / Results  Labs (all labs ordered are listed, but only abnormal results are displayed) Labs Reviewed  COMPREHENSIVE METABOLIC PANEL - Abnormal; Notable for the following components:      Result Value   Glucose, Bld 120 (*)    BUN 30 (*)    Creatinine, Ser 1.31 (*)    Total Bilirubin 2.0 (*)    All other  components within normal limits  BRAIN NATRIURETIC PEPTIDE - Abnormal; Notable for the following components:   B Natriuretic Peptide 3,955.9 (*)    All other components within normal limits  CBC WITH DIFFERENTIAL/PLATELET - Abnormal; Notable for the following components:   Hemoglobin 12.0 (*)    HCT 36.5 (*)    RDW 16.0 (*)    All other components within normal limits  NOVEL CORONAVIRUS, NAA (HOSPITAL ORDER, SEND-OUT TO REF LAB)  MAGNESIUM    EKG EKG Interpretation  Date/Time:  Friday May 07 2019 03:52:17 EDT Ventricular Rate:  96 PR Interval:    QRS Duration: 101 QT Interval:  415 QTC Calculation: 525 R Axis:   47 Text Interpretation:  Sinus rhythm Nonspecific T abnormalities, lateral leads Prolonged QT interval When compared with  ECG of 04/28/2019, No significant change was found Confirmed by Dione BoozeGlick, Kam Rahimi ([id number]) on 05/07/2019 4:07:50 AM   Radiology Dg Chest Port 1 View  Result Date: 05/07/2019 CLINICAL DATA:  Shortness of breath.  Lower extremity swelling. EXAM: PORTABLE CHEST 1 VIEW COMPARISON:  Chest x-ray dated April 28, 2019. FINDINGS: Stable cardiomegaly. Normal mediastinal contours. Unchanged mild pulmonary vascular congestion. No focal consolidation, pleural effusion, or pneumothorax. No acute osseous abnormality. IMPRESSION: Unchanged mild pulmonary vascular congestion without overt edema. Electronically Signed   By: Obie DredgeWilliam T Derry M.D.   On: 05/07/2019 04:24    Procedures Procedures  Medications Ordered in ED Medications  furosemide (LASIX) injection 40 mg (40 mg Intravenous Given 05/07/19 0354)  ondansetron (ZOFRAN) injection 4 mg (4 mg Intravenous Given 05/07/19 0354)     Initial Impression / Assessment and Plan / ED Course  I have reviewed the triage vital signs and the nursing notes.  Pertinent labs & imaging results that were available during my care of the patient were reviewed by me and considered in my medical decision making (see chart for details).  CHF  exacerbation.  Old records are reviewed, and he has multiple ED visits with similar complaints.  Will check screening labs and give IV furosemide.  Also, it is noted that he had a virtual visit with a nurse practitioner 2 days ago and was prescribed diclofenac tablets.  His last creatinine was 1.55 and creatinine has gone as high as 1.76 on May 8.  I do not feel that he should be taking systemic NSAIDs and will recommend that he discontinue oral diclofenac but can continue with topical diclofenac.  Chest x-ray continues to show cardiomegaly and some pulmonary vascular congestion without overt pulmonary edema.  ECG has prolonged QT interval and is unchanged from prior.  Magnesium level is normal.  Metabolic panel does show renal insufficiency slightly improved over most recent value.  Mild anemia is present which is unchanged from baseline.  Following IV furosemide, he had only a very modest diuresis.  BNP is come back markedly elevated at over 3900, and also increased significantly over value from June 7.  Because of failure to have adequate response to aggressive outpatient diuresis, it was felt he should be admitted for ongoing IV diuresis.  Case is discussed with Dr. Toniann FailKakrakandy of [facility] who agrees to admit the patient.  Final Clinical Impressions(s) / ED Diagnoses   Final diagnoses:  Acute on chronic combined systolic (congestive) and diastolic (congestive) heart failure (HCC)  Renal insufficiency  Normochromic normocytic anemia  Prolonged Q-T interval on ECG    ED Discharge Orders    None       Dione BoozeGlick, Yoland Scherr, MD 05/07/19 [phone]

## 2019-05-07 NOTE — ED Notes (Signed)
Pt placed on 3L for [facility] for comfort.

## 2019-05-07 NOTE — Discharge Instructions (Addendum)
Heart Failure, Self Care Heart failure is a serious condition. This sheet explains things you need to do to take care of yourself at home. To help you stay as healthy as possible, you may be asked to change your diet, take certain medicines, and make other changes in your life. Your doctor may also give you more specific instructions. If you have problems or questions, call your doctor. What are the risks? Having heart failure makes it more likely for you to have some problems. These problems can get worse if you do not take good care of yourself. Problems may include:  Blood clotting problems. This may cause a stroke.  Damage to the kidneys, liver, or lungs.  Abnormal heart rhythms. Supplies needed:  Scale for weighing yourself.  Blood pressure monitor.  Notebook.  Medicines. How to care for yourself when you have heart failure Medicines Take over-the-counter and prescription medicines only as told by your doctor. Take your medicines every day.  Do not stop taking your medicine unless your doctor tells you to do so.  Do not skip any medicines.  Get your prescriptions refilled before you run out of medicine. This is important. Eating and drinking   Eat heart-healthy foods. Talk with a diet specialist (dietitian) to create an eating plan.  Choose foods that: ? Have no trans fat. ? Are low in saturated fat and cholesterol.  Choose healthy foods, such as: ? Fresh or frozen fruits and vegetables. ? Fish. ? Low-fat (lean) meats. ? Legumes, such as beans, peas, and lentils. ? Fat-free or low-fat dairy products. ? Whole-grain foods. ? High-fiber foods.  Limit salt (sodium) if told by your doctor. Ask your diet specialist to tell you which seasonings are healthy for your heart.  Cook in healthy ways instead of frying. Healthy ways of cooking include roasting, grilling, broiling, baking, poaching, steaming, and stir-frying.  Limit how much fluid you drink, if told by your  doctor. Alcohol use  Do not drink alcohol if: ? Your doctor tells you not to drink. ? Your heart was damaged by alcohol, or you have very bad heart failure. ? You are pregnant, may be pregnant, or are planning to become pregnant.  If you drink alcohol: ? Limit how much you use to:  0-1 drink a day for women.  0-2 drinks a day for men. ? Be aware of how much alcohol is in your drink. In the [place] one drink equals one 12 oz bottle of beer (355 mL), one 5 oz glass of wine (148 mL), or one 1 oz glass of hard liquor (44 mL). Lifestyle   Do not use any products that contain nicotine or tobacco, such as cigarettes, e-cigarettes, and chewing tobacco. If you need help quitting, ask your doctor. ? Do not use nicotine gum or patches before talking to your doctor.  Do not use illegal drugs.  Lose weight if told by your doctor.  Do physical activity if told by your doctor. Talk to your doctor before you begin an exercise if: ? You are an older adult. ? You have very bad heart failure.  Learn to manage stress. If you need help, ask your doctor.  Get rehab (rehabilitation) to help you stay independent and to help with your quality of life.  Plan time to rest when you get tired. Check weight and blood pressure   Weigh yourself every day. This will help you to know if fluid is building up in your body. ? Weigh yourself every  morning after you pee (urinate) and before you eat breakfast. ? Wear the same amount of clothing each time. ? Write down your daily weight. Give your record to your doctor.  Check and write down your blood pressure as told by your doctor.  Check your pulse as told by your doctor. Dealing with very hot and very cold weather  If it is very hot: ? Avoid activities that take a lot of energy. ? Use air conditioning or fans, or find a cooler place. ? Avoid caffeine and alcohol. ? Wear clothing that is loose-fitting, lightweight, and light-colored.  If it is very  cold: ? Avoid activities that take a lot of energy. ? Layer your clothes. ? Wear mittens or gloves, a hat, and a scarf when you go outside. ? Avoid alcohol. Follow these instructions at home:  Stay up to date with shots (vaccines). Get pneumococcal and flu (influenza) shots.  Keep all follow-up visits as told by your doctor. This is important. Contact a doctor if:  You gain weight quickly.  You have increasing shortness of breath.  You cannot do your normal activities.  You get tired easily.  You cough a lot.  You don't feel like eating or feel like you may vomit (nauseous).  You become puffy (swell) in your hands, feet, ankles, or belly (abdomen).  You cannot sleep well because it is hard to breathe.  You feel like your heart is beating fast (palpitations).  You get dizzy when you stand up. Get help right away if:  You have trouble breathing.  You or someone else notices a change in your behavior, such as having trouble staying awake.  You have chest pain or discomfort.  You pass out (faint). These symptoms may be an emergency. Do not wait to see if the symptoms will go away. Get medical help right away. Call your local emergency services (911 in the [place] Do not drive yourself to the hospital. Summary  Heart failure is a serious condition. To care for yourself, you may have to change your diet, take medicines, and make other lifestyle changes.  Take your medicines every day. Do not stop taking them unless your doctor tells you to do so.  Eat heart-healthy foods, such as fresh or frozen fruits and vegetables, fish, lean meats, legumes, fat-free or low-fat dairy products, and whole-grain or high-fiber foods.  Ask your doctor if you can drink alcohol. You may have to stop alcohol use if you have very bad heart failure.  Contact your doctor if you gain weight quickly or feel that your heart is beating too fast. Get help right away if you pass out, or have chest pain  or trouble breathing. This information is not intended to replace advice given to you by your health care provider. Make sure you discuss any questions you have with your health care provider. Document Released: 02/03/2019 Document Revised: 02/02/2019 Document Reviewed: 02/03/2019 Elsevier Patient Education  2020 Elsevier Inc.   Heart Failure Action Plan A heart failure action plan helps you understand what to do when you have symptoms of heart failure. Follow the plan that was created by you and your health care provider. Review your plan each time you visit your health care provider. Red zone These signs and symptoms mean you should get medical help right away:  You have trouble breathing when resting.  You have a dry cough that is getting worse.  You have swelling or pain in your legs or abdomen that is  getting worse.  You suddenly gain more than 2-3 lb (0.9-1.4 kg) in a day, or more than 5 lb (2.3 kg) in one week. This amount may be more or less depending on your condition.  You have trouble staying awake or you feel confused.  You have chest pain.  You do not have an appetite.  You pass out. If you experience any of these symptoms:  Call your local emergency services (911 in the [place] right away or seek help at the emergency department of the nearest hospital. Yellow zone These signs and symptoms mean your condition may be getting worse and you should make some changes:  You have trouble breathing when you are active or you need to sleep with extra pillows.  You have swelling in your legs or abdomen.  You gain 2-3 lb (0.9-1.4 kg) in one day, or 5 lb (2.3 kg) in one week. This amount may be more or less depending on your condition.  You get tired easily.  You have trouble sleeping.  You have a dry cough. If you experience any of these symptoms:  Contact your health care provider within the next day.  Your health care provider may adjust your medicines. Green  zone These signs mean you are doing well and can continue what you are doing:  You do not have shortness of breath.  You have very little swelling or no new swelling.  Your weight is stable (no gain or loss).  You have a normal activity level.  You do not have chest pain or any other new symptoms. Follow these instructions at home:  Take over-the-counter and prescription medicines only as told by your health care provider.  Weigh yourself daily. Your target weight is __________ lb (__________ kg). ? Call your health care provider if you gain more than __________ lb (__________ kg) in a day, or more than __________ lb (__________ kg) in one week.  Eat a heart-healthy diet. Work with a diet and nutrition specialist (dietitian) to create an eating plan that is best for you.  Keep all follow-up visits as told by your health care provider. This is important. Where to find more information  American Heart Association: www.heart.org Summary  Follow the action plan that was created by you and your health care provider.  Get help right away if you have any symptoms in the Red zone. This information is not intended to replace advice given to you by your health care provider. Make sure you discuss any questions you have with your health care provider. Document Released: 11/30/2016 Document Revised: 10/03/2017 Document Reviewed: 11/30/2016 Elsevier Patient Education  2020 Elsevier Inc.   Heart Failure Eating Plan Heart failure, also called congestive heart failure, occurs when your heart does not pump blood well enough to meet your body's needs for oxygen-rich blood. Heart failure is a long-term (chronic) condition. Living with heart failure can be challenging. However, following your health care provider's instructions about a healthy lifestyle and working with a diet and nutrition specialist (dietitian) to choose the right foods may help to improve your symptoms. What are tips for  following this plan? Reading food labels  Check food labels for the amount of sodium per serving. Choose foods that have less than 140 mg (milligrams) of sodium in each serving.  Check food labels for the number of calories per serving. This is important if you need to limit your daily calorie intake to lose weight.  Check food labels for the serving size. If  you eat more than one serving, you will be eating more sodium and calories than what is listed on the label.  Look for foods that are labeled as "sodium-free," "very low sodium," or "low sodium." ? Foods labeled as "reduced sodium" or "lightly salted" may still have more sodium than what is recommended for you. Cooking  Avoid adding salt when cooking. Ask your health care provider or dietitian before using salt substitutes.  Season food with salt-free seasonings, spices, or herbs. Check the label of seasoning mixes to make sure they do not contain salt.  Cook with heart-healthy oils, such as olive, canola, soybean, or sunflower oil.  Do not fry foods. Cook foods using low-fat methods, such as baking, boiling, grilling, and broiling.  Limit unhealthy fats when cooking by: ? Removing the skin from poultry, such as chicken. ? Removing all visible fats from meats. ? Skimming the fat off from stews, soups, and gravies before serving them. Meal planning   Limit your intake of: ? Processed, canned, or pre-packaged foods. ? Foods that are high in trans fat, such as fried foods. ? Sweets, desserts, sugary drinks, and other foods with added sugar. ? Full-fat dairy products, such as whole milk.  Eat a balanced diet that includes: ? 4-5 servings of fruit each day and 4-5 servings of vegetables each day. At each meal, try to fill half of your plate with fruits and vegetables. ? Up to 6-8 servings of whole grains each day. ? Up to 2 servings of lean meat, poultry, or fish each day. One serving of meat is equal to 3 oz. This is about the  same size as a deck of cards. ? 2 servings of low-fat dairy each day. ? Heart-healthy fats. Healthy fats called omega-3 fatty acids are found in foods such as flaxseed and cold-water fish like sardines, salmon, and mackerel.  Aim to eat 25-35 g (grams) of fiber a day. Foods that are high in fiber include apples, broccoli, carrots, beans, peas, and whole grains.  Do not add salt or condiments that contain salt (such as soy sauce) to foods before eating.  When eating at a restaurant, ask that your food be prepared with less salt or no salt, if possible.  Try to eat 2 or more vegetarian meals each week.  Eat more home-cooked food and eat less restaurant, buffet, and fast food. General information  Do not eat more than 2,300 mg of salt (sodium) a day. The amount of sodium that is recommended for you may be lower, depending on your condition.  Maintain a healthy body weight as directed. Ask your health care provider what a healthy weight is for you. ? Check your weight every day. ? Work with your health care provider and dietitian to make a plan that is right for you to lose weight or maintain your current weight.  Limit how much fluid you drink. Ask your health care provider or dietitian how much fluid you can have each day.  Limit or avoid alcohol as told by your health care provider or dietitian. Recommended foods The items listed may not be a complete list. Talk with your dietitian about what dietary choices are best for you. Fruits All fresh, frozen, and canned fruits. Dried fruits, such as raisins, prunes, and cranberries. Vegetables All fresh vegetables. Vegetables that are frozen without sauce or added salt. Low-sodium or sodium-free canned vegetables. Grains Bread with less than 80 mg of sodium per slice. Whole-wheat pasta, quinoa, and brown rice. Oats  and oatmeal. Barley. Millet. Grits and cream of wheat. Whole-grain and whole-wheat cold cereal. Meats and other protein  foods Lean cuts of meat. Skinless chicken and [place]. Fish with high omega-3 fatty acids, such as salmon, sardines, and other cold-water fishes. Eggs. Dried beans, peas, and edamame. Unsalted nuts and nut butters. Dairy Low-fat or nonfat (skim) milk and dried milk. Rice milk, soy milk, and almond milk. Low-fat or nonfat yogurt. Small amounts of reduced-sodium block cheese. Low-sodium cottage cheese. Fats and oils Olive, canola, soybean, flaxseed, or sunflower oil. Avocado. Sweets and desserts Apple sauce. Granola bars. Sugar-free pudding and gelatin. Frozen fruit bars. Seasoning and other foods Fresh and dried herbs. Lemon or lime juice. Vinegar. Low-sodium ketchup. Salt-free marinades, salad dressings, sauces, and seasonings. The items listed above may not be a complete list of foods and beverages you can eat. Contact a dietitian for more information. Foods to avoid The items listed may not be a complete list. Talk with your dietitian about what dietary choices are best for you. Fruits Fruits that are dried with sodium-containing preservatives. Vegetables Canned vegetables. Frozen vegetables with sauce or seasonings. Creamed vegetables. [place] fries. Onion rings. Pickled vegetables and sauerkraut. Grains Bread with more than 80 mg of sodium per slice. Hot or cold cereal with more than 140 mg sodium per serving. Salted pretzels and crackers. Pre-packaged breadcrumbs. Bagels, croissants, and biscuits. Meats and other protein foods Ribs and chicken wings. Bacon, ham, pepperoni, bologna, salami, and packaged luncheon meats. Hot dogs, bratwurst, and sausage. Canned meat. Smoked meat and fish. Salted nuts and seeds. Dairy Whole milk, half-and-half, and cream. Buttermilk. Processed cheese, cheese spreads, and cheese curds. Regular cottage cheese. Feta cheese. Shredded cheese. String cheese. Fats and oils Butter, lard, shortening, ghee, and bacon fat. Canned and packaged gravies. Seasoning and  other foods Onion salt, garlic salt, table salt, and sea salt. Marinades. Regular salad dressings. Relishes, pickles, and olives. Meat flavorings and tenderizers, and bouillon cubes. Horseradish, ketchup, and mustard. Worcestershire sauce. Teriyaki sauce, soy sauce (including reduced sodium). Hot sauce and Tabasco sauce. Steak sauce, fish sauce, oyster sauce, and cocktail sauce. Taco seasonings. Barbecue sauce. Tartar sauce. The items listed above may not be a complete list of foods and beverages you should avoid. Contact a dietitian for more information. Summary  A heart failure eating plan includes changes that limit your intake of sodium and unhealthy fat, and it may help you lose weight or maintain a healthy weight. Your health care provider may also recommend limiting how much fluid you drink.  Most people with heart failure should eat no more than 2,300 mg of salt (sodium) a day. The amount of sodium that is recommended for you may be lower, depending on your condition.  Contact your health care provider or dietitian before making any major changes to your diet. This information is not intended to replace advice given to you by your health care provider. Make sure you discuss any questions you have with your health care provider. Document Released: 03/07/2017 Document Revised: 12/17/2018 Document Reviewed: 03/07/2017 Elsevier Patient Education  2020 [employer] One of your heart tests showed weakness of the heart muscle this admission. This may make you more susceptible to weight gain from fluid retention, which can lead to symptoms that we call heart failure. Please follow these special instructions:   1. Follow a low-salt diet - you are allowed no more than 2,000mg  of sodium per day. Watch your fluid intake. In general, you should not be taking in  more than 2 liters of fluid per day (no more than 8 glasses per day). This includes sources of water in foods like soup, coffee, tea, milk,  etc. 2. Weigh yourself on the same scale at same time of day and keep a log. 3. Call your doctor: (Anytime you feel any of the following symptoms)  - 3lb weight gain overnight or 5lb within a few days - Shortness of breath, with or without a dry hacking cough  - Swelling in the hands, feet or stomach  - If you have to sleep on extra pillows at night in order to breathe   IT IS IMPORTANT TO LET YOUR DOCTOR KNOW EARLY ON IF YOU ARE HAVING SYMPTOMS SO WE CAN HELP YOU! =======================================================================================  Stop taking the diclofenac tablets. It can harm your kidney. It is okay to continue using the topical diclofenac.  Heart Failure Nutrition Therapy This nutrition therapy will help you feel better and support your heart. This plan focuses on: Limiting sodium in your diet. Salt (sodium) makes your body hold water. When your body holds too much water, you can feel shortness of breath and swelling. You can prevent these symptoms by eating less salt. Limiting fluid in your diet. For some patients, drinking too much fluid can make heart failure worse. It can cause symptoms such as shortness of breath and swelling. Limiting fluids can help relieve some of your symptoms. Managing your weight. Your registered dietitian nutritionist (RDN) can help you choose a healthy weight for your body type. You can achieve these goals by: Reading food labels to keep track of how much sodium is in the foods you eat. Limiting foods that are high in sodium. Checking your weight to make sure youre not retaining too much fluid. Reading the Food Label: How Much Sodium Is Too Much? The nutrition plan for heart failure usually limits the sodium you get from food and drinks to 2,000 milligrams per day. Salt is the main source of sodium. Read the nutrition label to find out how much sodium is in 1 serving of a food. Select foods with 140 milligrams of sodium or less per  serving. Foods with more than 300 milligrams of sodium per serving may not fit into a reduced-sodium meal plan. Check serving sizes. If you eat more than 1 serving, you will get more sodium than the amount listed.  Cutting Back on Sodium Avoid processed foods. Eat more fresh foods. Fresh and frozen fruits and vegetables without added juices or sauces are naturally low in sodium. Fresh meats are lower in sodium than processed meats, such as bacon, sausage, and hot dogs. Read the nutrition label or ask your butcher to help you find a fresh meat that is low in sodium. Eat less salt, at the table and when cooking. Just 1 teaspoon of table salt has 2,300 milligrams of sodium. Leave the salt out of recipes for pasta, casseroles, and soups. Ask your RDN how to cook your favorite recipes without sodium. Be a Engineer, building services. Look for food packages that say salt-free or sodium-free. These items contain less than 5 milligrams of sodium per serving. Very-low-sodium products contain less than 35 milligrams of sodium per serving. Low-sodium products contain less than 140 milligrams of sodium per serving. Unsalted or no added salt products may still be high in sodium. Check the nutrition label. Add flavors to your food without adding sodium. Try lemon juice, lime juice, fruit juice, or vinegar. Dry or fresh herbs add flavor. Try basil, bay  leaf, dill, rosemary, parsley, sage, dry mustard, nutmeg, thyme, and paprika. Pepper, red pepper flakes, and cayenne pepper can add spice to your meals without adding sodium. Hot sauce contains sodium, but if you use just a drop or two, it will not add up to much. Buy a sodium-free seasoning blend or make your own at home. Use caution when you eat outside your home. Restaurant foods can be very high in sodium. Ask for nutrition information. Many restaurants provide nutrition facts on their menus or websites. Let your server know that you want your food to  be cooked without salt. Ask for your salad dressing and sauces to come on the side.  Fluid Restriction Your doctor may ask you to follow a fluid restriction in addition to taking diuretics (water pills). Ask your doctor how much fluid you can have. Foods that are liquid at room temperature are considered a fluid, such as popsicles, soup, ice cream, and Jell-O.  Here are some common conversions that will help you measure your fluid intake every day: 1,000 milliliters = 1 liter or 4 cups 1 fluid ounce = 30 milliliters 1 cup = 240 milliliters 2,000 milliliters = 2 liters or 8 cups 1,500 milliliters = 1 liters or 6 cups  Weight Monitoring Weigh yourself each day. Sudden weight gain is a sign that fluid is building up in your body. Follow these guidelines: Weigh yourself every morning. If you gain 3 or more pounds in 1-2 days or 5 or more pounds within 1 week, call your doctor. Your doctor may adjust your medicine to get rid of the extra fluid. Talk with your doctor or RDN about what a healthy weight is for you. Talk with your doctor to find out what type of physical activity is best for you.  Recommended Foods Grains Bread with less than 80 milligrams sodium per slice (yeast breads usually have less sodium than those made with baking soda) Homemade bread made with reduced-sodium baking soda Many cold cereals, especially shredded wheat and puffed ri ce Oats, grits, or cream of wheat Dry pastas, noodles, quinoa, a nd rice Vegetables Fresh and frozen vegetables without added sauces, salt, or sodium Homemade soups (salt free or low sodium) Low-sodium or sodium-free canned vegetab les and soups Fruits Fresh and canned fruits Dried fruits, such as rais ins, cranberries, and prunes Dairy (Milk and Milk Products) Milk or milk powder Rice milk and soy m ilk Yogurt, including Gree k yogurt Small amounts of natural, bloc k cheese or reduced-sodium cheese (Swiss, ricotta, and fresh mozzarella  are lower in sodium than others) Regular or soft cream cheese and low-sodium cottage cheese Protein Foods (Meat, Poultry, Fish, Writer) Fresh meats and fish [place] bacon (except if packaged in a sodium solution) Canned or packed tuna (no more than 4 ounces at 1 se rving) Dried beans and peas; edamame (fresh soybeans) Eggs or egg beaters (if less than 200 mg per serv ing) Unsalted nuts or peanut butter Desserts and Snacks Fresh fruit or applesauce Angel food cake Granola bars Unsalted pre tzels, popcorn, or nuts Pudding or Jell-O with Cool-Whip top ping Homemade rice-crispy treats Vanilla wafers Frozen fruit ba rs Fats Tub or liquid margarine Unsaturated fat oils (ca nola, olive, corn, sunflower, safflower, peanut) Condiments Fresh or dried herbs; low-sodium ketchup; vinegar; lemon or lime juice; pepper; salt-free seasoning mixes and marinades (Mrs. Deliah Boston or [employer] salt-free blend); simple salad dressings (vinegar and oil); salt-free sauces  Foods Not Recommended Food Group Foods Not Recommended Grains Breads  or crackers topped with salt Cereals (hot/cold) with more than 3 00 milligrams sodium per serving Biscuits, cornbread, and other quick breads prepared with baking s oda Prepackaged bread crumbs Self-rising flours Vegetables Canned vegetables (unless they are salt free or low sodium) Frozen vegetables with seasoning and sauces Sauerkraut and pickled vegetables Canned or dried soups (unless they are salt free or low sodium) [place] fries and onion rings Fruits Dried fruits preserved with sodium-containing additives Dairy (Milk and Milk Products) Buttermilk Processed cheeses such as Cheese Whiz, Velveeta, and Chesapeake EnergyQueso Cottage cheese (unless a low-sodium variety) Feta cheese; shredded cheese (has more sodi um than block cheese); singles slices and string cheese Protein Foods (Meat, Poultry, Fish, Beans) Cured meats: bacon, ham, sausage, pepperoni, and hot  dogs Canned meats: chili, Vienna sausage, sardines, and Spam Smoked fish and meats Frozen meals that have more than 600 milligrams sodium Fats Salted butter or margarine Condiments Salt, sea salt, kosher salt, onion salt, and garlic salt Seasoning mixes containing salt (Lemon Pepper or Bouillon cubes) Catsup or ketchup, BBQ sauce, Worcestershire and soy sauce Salsa, pickles, olives, relish Salad dressings: ranch, blu e cheese, [place], and [place] Alcohol-  Check with your doctor

## 2019-05-07 NOTE — ED Notes (Addendum)
Introduced self to patient. Provided pt with sandwich, cheese, and cranberry juice. Requested breakfast tray to be ordered. Pt requested to take off O2, so I agreed to do a room air trial

## 2019-05-07 NOTE — ED Triage Notes (Addendum)
Per EMS, patient coming from home with complaints of shortness of breath that is worse with exertion. Swelling noted to bilateral lower extremities. Patient has a history of CHF and states that he would like IV lasix as his oral lasix is "not working fast enough." Patient also states that he has not had anything to eat in days.

## 2019-05-07 NOTE — ED Notes (Signed)
Pt refused ambulation w/pulse ox stating "I'm not ready to walk yet, I need to rest... we can do it later if that's alright?"

## 2019-05-07 NOTE — ED Notes (Signed)
Pt provided breakfast tray.

## 2019-05-08 DIAGNOSIS — F1410 Cocaine abuse, uncomplicated: Secondary | ICD-10-CM

## 2019-05-08 DIAGNOSIS — R9431 Abnormal electrocardiogram [ECG] [EKG]: Secondary | ICD-10-CM

## 2019-05-08 DIAGNOSIS — N182 Chronic kidney disease, stage 2 (mild): Secondary | ICD-10-CM

## 2019-05-08 DIAGNOSIS — I10 Essential (primary) hypertension: Secondary | ICD-10-CM

## 2019-05-08 DIAGNOSIS — Z9119 Patient's noncompliance with other medical treatment and regimen: Secondary | ICD-10-CM

## 2019-05-08 DIAGNOSIS — N179 Acute kidney failure, unspecified: Secondary | ICD-10-CM

## 2019-05-08 DIAGNOSIS — N189 Chronic kidney disease, unspecified: Secondary | ICD-10-CM

## 2019-05-08 DIAGNOSIS — I5021 Acute systolic (congestive) heart failure: Secondary | ICD-10-CM

## 2019-05-08 LAB — BASIC METABOLIC PANEL
Anion gap: 10 (ref 5–15)
BUN: 36 mg/dL — ABNORMAL HIGH (ref 6–20)
CO2: 28 mmol/L (ref 22–32)
Calcium: 8.5 mg/dL — ABNORMAL LOW (ref 8.9–10.3)
Chloride: 101 mmol/L (ref 98–111)
Creatinine, Ser: 1.58 mg/dL — ABNORMAL HIGH (ref 0.61–1.24)
GFR calc Af Amer: 56 mL/min — ABNORMAL LOW (ref >60)
GFR calc non Af Amer: 49 mL/min — ABNORMAL LOW (ref >60)
Glucose, Bld: 137 mg/dL — ABNORMAL HIGH (ref 70–99)
Potassium: 3.6 mmol/L (ref 3.5–5.1)
Sodium: 139 mmol/L (ref 135–145)

## 2019-05-08 LAB — RAPID URINE DRUG SCREEN, HOSP PERFORMED
Amphetamines: NOT DETECTED
Barbiturates: NOT DETECTED
Benzodiazepines: NOT DETECTED
Cocaine: NOT DETECTED
Opiates: NOT DETECTED
Tetrahydrocannabinol: POSITIVE — AB

## 2019-05-08 LAB — MAGNESIUM: Magnesium: 2.1 mg/dL (ref 1.7–2.4)

## 2019-05-08 MED ORDER — POTASSIUM CHLORIDE CRYS ER 20 MEQ PO TBCR
40.0000 meq | EXTENDED_RELEASE_TABLET | Freq: Once | ORAL | Status: AC
  Administered 2019-05-08: 16:00:00 40 meq via ORAL
  Filled 2019-05-08: qty 2

## 2019-05-08 MED ORDER — SODIUM CHLORIDE 0.9% FLUSH
3.0000 mL | Freq: Two times a day (BID) | INTRAVENOUS | Status: DC
  Administered 2019-05-08 – 2019-05-11 (×4): 3 mL via INTRAVENOUS

## 2019-05-08 MED ORDER — LOSARTAN POTASSIUM 25 MG PO TABS
25.0000 mg | ORAL_TABLET | Freq: Every day | ORAL | Status: AC
  Administered 2019-05-09: 25 mg via ORAL
  Filled 2019-05-08: qty 1

## 2019-05-08 MED ORDER — FUROSEMIDE 10 MG/ML IJ SOLN
40.0000 mg | Freq: Four times a day (QID) | INTRAMUSCULAR | Status: DC
  Administered 2019-05-08 – 2019-05-09 (×4): 40 mg via INTRAVENOUS
  Filled 2019-05-08 (×4): qty 4

## 2019-05-08 NOTE — Progress Notes (Signed)
Patient ambulated approximately 90 ft on room air and lowest oxygen saturation got was 90%. Highest was 100%. Tolerated well with no SOB. Eulas Post, RN

## 2019-05-08 NOTE — Progress Notes (Signed)
Progress Note  Patient Name: Dale [name] Date of Encounter: 05/08/2019  Primary Cardiologist: Kristeen Miss, MD   Subjective   The patient feels slightly better with improved SOB, but still DOE and mild LE edema.  Inpatient Medications    Scheduled Meds:  aspirin EC  81 mg Oral Daily   atorvastatin  80 mg Oral Daily   carvedilol  12.5 mg Oral BID WC   clopidogrel  75 mg Oral Q breakfast   enoxaparin (LOVENOX) injection  50 mg Subcutaneous Q24H   furosemide  60 mg Intravenous Q12H   isosorbide-hydrALAZINE  1 tablet Oral TID   losartan  25 mg Oral Daily   prazosin  1 mg Oral Daily   spironolactone  25 mg Oral Daily   Continuous Infusions:  PRN Meds: acetaminophen **OR** acetaminophen   Vital Signs    Vitals:   05/07/19 1939 05/07/19 2044 05/08/19 0528 05/08/19 0831  BP: (!) 123/108 106/77 114/79 (!) 141/105  Pulse: 83 86 83   Resp:  18 17   Temp:  97.7 F (36.5 C) 98.2 F (36.8 C)   TempSrc:  Oral Oral   SpO2:  98% 97%   Weight:      Height:        Intake/Output Summary (Last 24 hours) at 05/08/2019 0950 Last data filed at 05/08/2019 0900 Gross per 24 hour  Intake 1372 ml  Output 2450 ml  Net -1078 ml   Last 3 Weights 05/07/2019 05/07/2019 04/28/2019  Weight (lbs) 254 lb 6.6 oz 230 lb 230 lb  Weight (kg) 115.4 kg 104.327 kg 104.327 kg      Telemetry    SR - Personally Reviewed  ECG    No new tracing - Personally Reviewed  Physical Exam   GEN: No acute distress.   Neck: No JVD Cardiac: RRR, no murmurs, rubs, or gallops.  Respiratory: crackles at bases to auscultation bilaterally. GI: Soft, nontender, non-distended  MS: mild non-pitting B/L LE edema; No deformity. Neuro:  Nonfocal  Psych: Normal affect   Labs    High Sensitivity Troponin:   Recent Labs  Lab 05/07/19 0646  TROPONINIHS 41.0*   39*      Cardiac EnzymesNo results for input(s): TROPONINI in the last 168 hours. No results for input(s): TROPIPOC in the last 168 hours.    Chemistry Recent Labs  Lab 05/07/19 0353 05/07/19 0646 05/08/19 0424  NA 138 136 139  K 4.1 3.3* 3.6  CL 100 101 101  CO2 25 25 28   GLUCOSE 120* 132* 137*  BUN 30* 32* 36*  CREATININE 1.31* 1.41* 1.58*  CALCIUM 8.9 8.6* 8.5*  PROT 7.4 7.3  --   ALBUMIN 3.7 3.7  --   AST 37 28  --   ALT 39 36  --   ALKPHOS 111 105  --   BILITOT 2.0* 1.9*  --   GFRNONAA >60 56* 49*  GFRAA >60 >60 56*  ANIONGAP 13 10 10      Hematology Recent Labs  Lab 05/07/19 0353 05/07/19 0646  WBC 7.5 7.8  RBC 4.38 4.26  HGB 12.0* 11.8*  HCT 36.5* 35.7*  MCV 83.3 83.8  MCH 27.4 27.7  MCHC 32.9 33.1  RDW 16.0* 16.1*  PLT 331 328    BNP Recent Labs  Lab 05/07/19 0353  BNP 3,955.9*     DDimer No results for input(s): DDIMER in the last 168 hours.   Radiology    Dg Chest Port 1 View  Result Date: 05/07/2019  CLINICAL DATA:  Shortness of breath.  Lower extremity swelling. EXAM: PORTABLE CHEST 1 VIEW COMPARISON:  Chest x-ray dated April 28, 2019. FINDINGS: Stable cardiomegaly. Normal mediastinal contours. Unchanged mild pulmonary vascular congestion. No focal consolidation, pleural effusion, or pneumothorax. No acute osseous abnormality. IMPRESSION: Unchanged mild pulmonary vascular congestion without overt edema. Electronically Signed   By: Titus Dubin M.D.   On: 05/07/2019 04:24   Ct Renal Stone Study  Result Date: 05/07/2019 CLINICAL DATA:  Nausea and vomiting. EXAM: CT ABDOMEN AND PELVIS WITHOUT CONTRAST TECHNIQUE: Multidetector CT imaging of the abdomen and pelvis was performed following the standard protocol without IV contrast. COMPARISON:  None. FINDINGS: Lower chest: No acute abnormality.  Cardiomegaly. Hepatobiliary: No focal liver abnormality is seen. No gallstones, gallbladder wall thickening, or biliary dilatation. Pancreas: Unremarkable. No pancreatic ductal dilatation or surrounding inflammatory changes. Spleen: Normal in size without focal abnormality. Adrenals/Urinary Tract:  Adrenal glands are unremarkable. Kidneys are normal, without renal calculi, focal lesion, or hydronephrosis. Bladder is unremarkable. Stomach/Bowel: Stomach is within normal limits. Appendix appears normal. No evidence of bowel wall thickening, distention, or inflammatory changes. Vascular/Lymphatic: Ectatic infrarenal abdominal aorta measuring up to 2.9 cm. Aortic atherosclerosis. No enlarged abdominal or pelvic lymph nodes. Reproductive: Prostate is unremarkable. Other: Trace perihepatic and pelvic ascites.  No pneumoperitoneum. Musculoskeletal: Mild anasarca. No acute or significant osseous findings. IMPRESSION: 1.  No acute intra-abdominal process. 2. Trace ascites.  Mild anasarca. 3. Ectatic abdominal aorta at risk for aneurysm development. Recommend followup by ultrasound in 5 years. This recommendation follows ACR consensus guidelines: White Paper of the ACR Incidental Findings Committee II on Vascular Findings. J Am Coll Radiol 2013; 10:789-794. Aortic aneurysm NOS (ICD10-I71.9) 4.  Aortic atherosclerosis (ICD10-I70.0). Electronically Signed   By: Titus Dubin M.D.   On: 05/07/2019 06:46    Cardiac Studies   TTE: 03/11/2019 03/11/2019  Echo 1. The left ventricle has severely reduced systolic function, with an ejection fraction of 20-25%. The cavity size was moderately dilated. There is mildly increased left ventricular wall thickness. Left ventricular diastolic Doppler parameters are  consistent with restrictive filling. Elevated left ventricular end-diastolic pressure. 2. Diffuse hypokinesis worse in the inferior wall. 3. The right ventricle has normal systolic function. The cavity was moderately enlarged. There is no increase in right ventricular wall thickness. 4. Right atrial size was mildly dilated. 5. Mild thickening of the mitral valve leaflet. 6. The aortic valve is tricuspid. Mild thickening of the aortic valve. Mild calcification of the aortic valve.  Patient Profile     58  y.o. male with a hx of systolic CHF who is being seen today for the evaluation of CHF at the request of Dr Hal Hope  Assessment & Plan    1. Acute on chronic systolic CHF   PT has had multiple admissions and ER vists for CHF   Presents again.   ? Noncompliance with meds  / diet      I have discussed with the pt   He has evidence of atherosclerosis of aorta on CT scan   I think it would be important to do a R and L heart cath to define anatomy/pressures; rule out any potentially treatable causes for CHF    At first the pt refused  Then he said he would do it on Monday  Would recomm R and L to define anatomy    WIll keep NPO on 7/5  Add to board  Hold on orders for now - crea 1.4 -> 1.58, I  will switch lasix to 40 mg iv Q6H, follow crea, K closely, hold lasix tomorrow prior to cath  2  HTN  - improved   3   PRolonged QT  Keep electrolytes maintained to near normal; Avoid drugs that prolong QT   Follow EKGs  4 CKD  Follow Cr closely with diuresis  May need to back off    5  PAD  PT with atherosclerosis of aorta  Continue statin  6  Substance abuse  Counselled on importance of avoiding cocaine, EToH given hx of CHF  For questions or updates, please contact [facility] Please consult [email or web address] for contact info under     Signed, Tobias AlexanderKatarina Darika Ildefonso, MD  05/08/2019, 9:50 AM

## 2019-05-08 NOTE — Progress Notes (Addendum)
PROGRESS NOTE   Dale [name]  JJO:[id number]    DOB: May 19, 1961    DOA: 05/07/2019  PCP: Patient, No Pcp Per   I have briefly reviewed patients previous medical records in [facility].  Chief Complaint  Patient presents with   Shortness of Breath    Brief Narrative:  59 year old male with PMH of chronic systolic CHF (LVEF 20-25% in May 2020), recent NSTEMI, HTN, stage II chronic kidney disease, anemia, polysubstance abuse (cocaine and alcohol) presented to ED due to progressive dyspnea, orthopnea, leg edema, nausea and vomiting and inability to keep anything down and was admitted for acute on chronic systolic CHF.  Cardiology consulted and plan right and left heart cath on 05/10/2019.  Assessment & Plan:   Principal Problem:   Acute systolic CHF (congestive heart failure) (HCC) Active Problems:   Essential hypertension   Chronic kidney disease, stage II (mild)   Normochromic anemia   Acute CHF (congestive heart failure) (HCC)   Nausea & vomiting   Acute on chronic systolic CHF  TTE May 2020 showed LVEF 20-25%.  Cardiology follow-up appreciated.  Discussed with Dr. Delton See.  Multiple admissions and ED visits for CHF.  Suspect noncompliance to medications and diet-volunteers to no salt restriction.  Treated with IV Lasix initially 60 mg every 12 hourly, changed today to 40 mg every 6 hourly, spironolactone 25 mg daily, BiDil 1 tablet 3 times daily and losartan.  Intake output charting likely not fully accurate.  Only -733 mL since admission.  No appropriate weight recordings.  Clinically however patient has improved.  Cardiology plans right and left heart cath 7/6.  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  Patient previously declined cath.  Remains on aspirin, Plavix, statins and beta-blockers.  No angina.  Essential hypertension  Mostly controlled on carvedilol 12.5 mg twice daily, BiDil 1 tablet 3 times daily, losartan 25 mg daily, prazosin 1  mg daily and current above dose of diuretics.  Monitor.  Hypokalemia  Replaced.  Magnesium normal.  Nausea and vomiting  May be related to congestive gastropathy from decompensated CHF.  Resolved.  CT abdomen and LFTs unremarkable.  Acute kidney injury on stage II chronic kidney disease  Baseline creatinine around 1.3.  Creatinine has steadily gone up to 1.58.  Likely due to IV diuresis and ongoing losartan use.  Lasix changed as noted above.  Follow BMP in a.m. and if creatinine continues to rise, may have to hold off on ARB and cut back on diuretics.  Anemia of chronic disease  Stable  Prolonged QTC  EKG 7/3 showed QTC of 525 ms.  Avoid QT prolonging medications.  Replace K to >4 and magnesium >2.  Repeat EKG today.  Polysubstance abuse (cocaine and alcohol)  Patient states that he has not used any of these for more than a month.  Recheck UDS.  Homeless  Social work consult.  Ectatic abdominal aorta  Noted on CT abdomen  Recommend repeating ultrasound to follow-up in 5 years.  DVT prophylaxis: Lovenox Code Status: Full Family Communication: None at bedside Disposition: To home pending clinical improvement and cardiology clearance.   Consultants:  Cardiology  Procedures:  None  Antimicrobials:  None   Subjective: Feels much better.  Dyspnea significantly improved.  Had not been able to sleep for several days prior to admission but has been able to sleep better for the last 2 days.  Urinating well.  Dyspnea has improved but not completely resolved.  No chest pain  ROS: As above, otherwise negative.  No nausea or vomiting reported.  Objective:  Vitals:   05/07/19 2044 05/08/19 0528 05/08/19 0831 05/08/19 1332  BP: 106/77 114/79 (!) 141/105 110/75  Pulse: 86 83  81  Resp: 18 17  14   Temp: 97.7 F (36.5 C) 98.2 F (36.8 C)  97.9 F (36.6 C)  TempSrc: Oral Oral  Oral  SpO2: 98% 97%  95%  Weight:      Height:         Examination:  General exam: Pleasant young male, moderately built and obese lying comfortably propped up in bed. Respiratory system: Slightly diminished breath sounds in the bases with occasional basal crackles. Respiratory effort normal. Cardiovascular system: S1 & S2 heard, RRR. No JVD, murmurs, rubs, gallops or clicks.  Trace bilateral ankle edema.  Telemetry personally reviewed: Sinus rhythm with BBB morphology. Gastrointestinal system: Abdomen is nondistended, soft and nontender. No organomegaly or masses felt. Normal bowel sounds heard. Central nervous system: Alert and oriented. No focal neurological deficits. Extremities: Symmetric 5 x 5 power. Skin: No rashes, lesions or ulcers Psychiatry: Judgement and insight appear normal. Mood & affect appropriate.     Data Reviewed: I have personally reviewed following labs and imaging studies  CBC: Recent Labs  Lab 05/07/19 0353 05/07/19 0646  WBC 7.5 7.8  NEUTROABS 5.3 5.1  HGB 12.0* 11.8*  HCT 36.5* 35.7*  MCV 83.3 83.8  PLT 331 328   Basic Metabolic Panel: Recent Labs  Lab 05/07/19 0353 05/07/19 0646 05/08/19 0424  NA 138 136 139  K 4.1 3.3* 3.6  CL 100 101 101  CO2 25 25 28   GLUCOSE 120* 132* 137*  BUN 30* 32* 36*  CREATININE 1.31* 1.41* 1.58*  CALCIUM 8.9 8.6* 8.5*  MG 2.2 2.3 2.1   Liver Function Tests: Recent Labs  Lab 05/07/19 0353 05/07/19 0646  AST 37 28  ALT 39 36  ALKPHOS 111 105  BILITOT 2.0* 1.9*  PROT 7.4 7.3  ALBUMIN 3.7 3.7     Recent Results (from the past 240 hour(s))  SARS Coronavirus 2 (CEPHEID - Performed [facility] lab), Hosp Order     Status: None   Collection Time: 05/07/19  6:01 AM   Specimen: Nasopharyngeal Swab  Result Value Ref Range Status   SARS Coronavirus 2 NEGATIVE NEGATIVE Final    Comment: (NOTE) If result is NEGATIVE SARS-CoV-2 target nucleic acids are NOT DETECTED. The SARS-CoV-2 RNA is generally detectable in upper and lower  respiratory  specimens during the acute phase of infection. The lowest  concentration of SARS-CoV-2 viral copies this assay can detect is 250  copies / mL. A negative result does not preclude SARS-CoV-2 infection  and should not be used as the sole basis for treatment or other  patient management decisions.  A negative result may occur with  improper specimen collection / handling, submission of specimen other  than nasopharyngeal swab, presence of viral mutation(s) within the  areas targeted by this assay, and inadequate number of viral copies  (<250 copies / mL). A negative result must be combined with clinical  observations, patient history, and epidemiological information. If result is POSITIVE SARS-CoV-2 target nucleic acids are DETECTED. The SARS-CoV-2 RNA is generally detectable in upper and lower  respiratory specimens dur ing the acute phase of infection.  Positive  results are indicative of active infection with SARS-CoV-2.  Clinical  correlation with patient history and other diagnostic information is  necessary to determine patient infection status.  Positive results do  not rule out bacterial infection or co-infection with other viruses. If result is PRESUMPTIVE POSTIVE SARS-CoV-2 nucleic acids MAY BE PRESENT.   A presumptive positive result was obtained on the submitted specimen  and confirmed on repeat testing.  While 2019 novel coronavirus  (SARS-CoV-2) nucleic acids may be present in the submitted sample  additional confirmatory testing may be necessary for epidemiological  and / or clinical management purposes  to differentiate between  SARS-CoV-2 and other Sarbecovirus currently known to infect humans.  If clinically indicated additional testing with an alternate test  methodology [phone] is advised. The SARS-CoV-2 RNA is generally  detectable in upper and lower respiratory sp ecimens during the acute  phase of infection. The expected result is Negative. Fact Sheet for  Patients:  BoilerBrush.com.cyhttps://www.fda.gov/media/136312/download Fact Sheet for Healthcare Providers: https://pope.com/https://www.fda.gov/media/136313/download This test is not yet approved or cleared by the [place] FDA and has been authorized for detection and/or diagnosis of SARS-CoV-2 by FDA under an Emergency Use Authorization (EUA).  This EUA will remain in effect (meaning this test can be used) for the duration of the COVID-19 declaration under Section 564(b)(1) of the Act, 21 U.S.C. section 360bbb-3(b)(1), unless the authorization is terminated or revoked sooner. Performed at [facility], [address] [place], [place] [id number]          Radiology Studies: Dg Chest Port 1 View  Result Date: 05/07/2019 CLINICAL DATA:  Shortness of breath.  Lower extremity swelling. EXAM: PORTABLE CHEST 1 VIEW COMPARISON:  Chest x-ray dated April 28, 2019. FINDINGS: Stable cardiomegaly. Normal mediastinal contours. Unchanged mild pulmonary vascular congestion. No focal consolidation, pleural effusion, or pneumothorax. No acute osseous abnormality. IMPRESSION: Unchanged mild pulmonary vascular congestion without overt edema. Electronically Signed   By: Obie DredgeWilliam T Derry M.D.   On: 05/07/2019 04:24   Ct Renal Stone Study  Result Date: 05/07/2019 CLINICAL DATA:  Nausea and vomiting. EXAM: CT ABDOMEN AND PELVIS WITHOUT CONTRAST TECHNIQUE: Multidetector CT imaging of the abdomen and pelvis was performed following the standard protocol without IV contrast. COMPARISON:  None. FINDINGS: Lower chest: No acute abnormality.  Cardiomegaly. Hepatobiliary: No focal liver abnormality is seen. No gallstones, gallbladder wall thickening, or biliary dilatation. Pancreas: Unremarkable. No pancreatic ductal dilatation or surrounding inflammatory changes. Spleen: Normal in size without focal abnormality. Adrenals/Urinary Tract: Adrenal glands are unremarkable. Kidneys are normal, without renal calculi, focal lesion, or  hydronephrosis. Bladder is unremarkable. Stomach/Bowel: Stomach is within normal limits. Appendix appears normal. No evidence of bowel wall thickening, distention, or inflammatory changes. Vascular/Lymphatic: Ectatic infrarenal abdominal aorta measuring up to 2.9 cm. Aortic atherosclerosis. No enlarged abdominal or pelvic lymph nodes. Reproductive: Prostate is unremarkable. Other: Trace perihepatic and pelvic ascites.  No pneumoperitoneum. Musculoskeletal: Mild anasarca. No acute or significant osseous findings. IMPRESSION: 1.  No acute intra-abdominal process. 2. Trace ascites.  Mild anasarca. 3. Ectatic abdominal aorta at risk for aneurysm development. Recommend followup by ultrasound in 5 years. This recommendation follows ACR consensus guidelines: White Paper of the ACR Incidental Findings Committee II on Vascular Findings. J Am Coll Radiol 2013; 10:789-794. Aortic aneurysm NOS (ICD10-I71.9) 4.  Aortic atherosclerosis (ICD10-I70.0). Electronically Signed   By: Obie DredgeWilliam T Derry M.D.   On: 05/07/2019 06:46        Scheduled Meds:  aspirin EC  81 mg Oral Daily   atorvastatin  80 mg Oral Daily   carvedilol  12.5 mg Oral BID WC   clopidogrel  75 mg Oral Q breakfast   enoxaparin (LOVENOX)  injection  50 mg Subcutaneous Q24H   furosemide  40 mg Intravenous Q6H   isosorbide-hydrALAZINE  1 tablet Oral TID   losartan  25 mg Oral Daily   prazosin  1 mg Oral Daily   spironolactone  25 mg Oral Daily   Continuous Infusions:   LOS: 1 day     Vernell Leep, MD, FACP, [facility]. [facility]  To contact the attending provider between 7A-7P or the covering provider during after hours 7P-7A, please log into the web site [email or web address] and access using universal [place] password for that web site. If you do not have the password, please call the hospital operator.  05/08/2019, 1:35 PM

## 2019-05-09 LAB — BASIC METABOLIC PANEL
Anion gap: 9 (ref 5–15)
BUN: 34 mg/dL — ABNORMAL HIGH (ref 6–20)
CO2: 29 mmol/L (ref 22–32)
Calcium: 8.7 mg/dL — ABNORMAL LOW (ref 8.9–10.3)
Chloride: 102 mmol/L (ref 98–111)
Creatinine, Ser: 1.44 mg/dL — ABNORMAL HIGH (ref 0.61–1.24)
GFR calc Af Amer: >60 mL/min (ref >60)
GFR calc non Af Amer: 54 mL/min — ABNORMAL LOW (ref >60)
Glucose, Bld: 98 mg/dL (ref 70–99)
Potassium: 3.7 mmol/L (ref 3.5–5.1)
Sodium: 140 mmol/L (ref 135–145)

## 2019-05-09 MED ORDER — SODIUM CHLORIDE 0.9 % IV SOLN
INTRAVENOUS | Status: DC
  Administered 2019-05-10: via INTRAVENOUS

## 2019-05-09 MED ORDER — SODIUM CHLORIDE 0.9 % IV SOLN: 250.0000 mL | INTRAVENOUS | Status: DC | PRN

## 2019-05-09 MED ORDER — ASPIRIN 81 MG PO CHEW
81.0000 mg | CHEWABLE_TABLET | ORAL | Status: AC
  Administered 2019-05-10: 81 mg via ORAL
  Filled 2019-05-09: qty 1

## 2019-05-09 MED ORDER — SODIUM CHLORIDE 0.9% FLUSH: 3.0000 mL | INTRAVENOUS | Status: DC | PRN

## 2019-05-09 NOTE — Progress Notes (Signed)
Pt requesting oxygen, oxygen saturation 100% on room air.  Advised patient that he does not meet criteria for oxygen however did offer patient 1 liter for comfort since he is c/o shortness of breath.

## 2019-05-09 NOTE — H&P (View-Only) (Signed)
Progress Note  Patient Name: Dale [name] Date of Encounter: 05/09/2019  Primary Cardiologist: Mertie Moores, MD   Subjective   The patient feels better with improved SOB.  Inpatient Medications    Scheduled Meds: . aspirin EC  81 mg Oral Daily  . atorvastatin  80 mg Oral Daily  . carvedilol  12.5 mg Oral BID WC  . clopidogrel  75 mg Oral Q breakfast  . enoxaparin (LOVENOX) injection  50 mg Subcutaneous Q24H  . furosemide  40 mg Intravenous Q6H  . isosorbide-hydrALAZINE  1 tablet Oral TID  . losartan  25 mg Oral Daily  . prazosin  1 mg Oral Daily  . sodium chloride flush  3 mL Intravenous Q12H  . spironolactone  25 mg Oral Daily   Continuous Infusions:  PRN Meds: acetaminophen **OR** acetaminophen   Vital Signs    Vitals:   05/08/19 0831 05/08/19 1332 05/08/19 2124 05/09/19 0442  BP: (!) 141/105 110/75 115/84 123/89  Pulse:  81 80 87  Resp:  14 16 16   Temp:  97.9 F (36.6 C) 98.1 F (36.7 C) 98.3 F (36.8 C)  TempSrc:  Oral Oral Oral  SpO2:  95% 94% 98%  Weight:    109.1 kg  Height:        Intake/Output Summary (Last 24 hours) at 05/09/2019 0854 Last data filed at 05/09/2019 0826 Gross per 24 hour  Intake 1239 ml  Output 6150 ml  Net -4911 ml   Last 3 Weights 05/09/2019 05/07/2019 05/07/2019  Weight (lbs) 240 lb 9.6 oz 254 lb 6.6 oz 230 lb  Weight (kg) 109.135 kg 115.4 kg 104.327 kg      Telemetry    SR - Personally Reviewed  ECG    No new tracing - Personally Reviewed  Physical Exam   GEN: No acute distress.   Neck: No JVD Cardiac: RRR, no murmurs, rubs, or gallops.  Respiratory: crackles at bases to auscultation bilaterally. GI: Soft, nontender, non-distended  MS: mild non-pitting B/L LE edema; No deformity. Neuro:  Nonfocal  Psych: Normal affect   Labs    High Sensitivity Troponin:   Recent Labs  Lab 05/07/19 0646  TROPONINIHS 41.0*  39*      Cardiac EnzymesNo results for input(s): TROPONINI in the last 168 hours. No results for  input(s): TROPIPOC in the last 168 hours.   Chemistry Recent Labs  Lab 05/07/19 0353 05/07/19 0646 05/08/19 0424 05/09/19 0418  NA 138 136 139 140  K 4.1 3.3* 3.6 3.7  CL 100 101 101 102  CO2 25 25 28 29   GLUCOSE 120* 132* 137* 98  BUN 30* 32* 36* 34*  CREATININE 1.31* 1.41* 1.58* 1.44*  CALCIUM 8.9 8.6* 8.5* 8.7*  PROT 7.4 7.3  --   --   ALBUMIN 3.7 3.7  --   --   AST 37 28  --   --   ALT 39 36  --   --   ALKPHOS 111 105  --   --   BILITOT 2.0* 1.9*  --   --   GFRNONAA >60 56* 49* 54*  GFRAA >60 >60 56* >60  ANIONGAP 13 10 10 9      Hematology Recent Labs  Lab 05/07/19 0353 05/07/19 0646  WBC 7.5 7.8  RBC 4.38 4.26  HGB 12.0* 11.8*  HCT 36.5* 35.7*  MCV 83.3 83.8  MCH 27.4 27.7  MCHC 32.9 33.1  RDW 16.0* 16.1*  PLT 331 328    BNP Recent Labs  Lab 05/07/19 0353  BNP 3,955.9*     DDimer No results for input(s): DDIMER in the last 168 hours.   Radiology    No results found.  Cardiac Studies   TTE: 03/11/2019 03/11/2019  Echo 1. The left ventricle has severely reduced systolic function, with an ejection fraction of 20-25%. The cavity size was moderately dilated. There is mildly increased left ventricular wall thickness. Left ventricular diastolic Doppler parameters are  consistent with restrictive filling. Elevated left ventricular end-diastolic pressure. 2. Diffuse hypokinesis worse in the inferior wall. 3. The right ventricle has normal systolic function. The cavity was moderately enlarged. There is no increase in right ventricular wall thickness. 4. Right atrial size was mildly dilated. 5. Mild thickening of the mitral valve leaflet. 6. The aortic valve is tricuspid. Mild thickening of the aortic valve. Mild calcification of the aortic valve.  Patient Profile     57 y.o. male with a hx of systolic CHF who is being seen today for the evaluation of CHF at the request of Dr Kakrakandy  Assessment & Plan    1. Acute on chronic systolic CHF   PT  has had multiple admissions and ER vists for CHF   Presents again, sec noncompliance with meds  / diet. I have discussed with the pt   He has evidence of atherosclerosis of aorta on CT scan   I think it would be important to do a R and L heart cath to define anatomy/pressures; rule out any potentially treatable causes for CHF    At first the pt refused  Then he said he would do it on Monday  Would recomm R and L to define anatomy    WIll keep NPO today.  - crea 1.4 -> 1.58->1.4, I will hold lasix today for a cath tomorrow.  2  HTN  - improved   3   Prolonged QT  Keep electrolytes maintained to near normal; Avoid drugs that prolong QT   Follow EKGs  4 CKD  Follow Cr closely with diuresis  5  PAD  PT with atherosclerosis of aorta  Continue statin  6  Substance abuse  Counselled on importance of avoiding cocaine, EToH given hx of CHF  For questions or updates, please contact [facility] Please consult [email or web address] for contact info under     Signed, Cecillia Menees, MD  05/09/2019, 8:54 AM    

## 2019-05-09 NOTE — TOC Progression Note (Signed)
Transition of Care [facility]) - Progression Note    Patient Details  Name: Dale [name] MRN: [id number] Date of Birth: 06/06/1961  Transition of Care [facility]) CM/SW [place], [place] Phone Number: 05/09/2019, 3:06 PM  Clinical Narrative:   LCSW arrived at patient's room and provided education on housing resources within the local area. Patient is currently homeless and plans on staying with a friend post hospital discharge. LCSW provided list of homelessness support resources and services. Patient appreciative of this education. Patient interested in applying for Section 8 but is unsure what county he wishes to reside in.  Readmission Risk Interventions Readmission Risk Prevention Plan 03/03/2019  Transportation Screening Complete  Medication Review Press photographer) Complete  PCP or Specialist appointment within 3-5 days of discharge Complete  HRI or [place] (No Data)  SW Recovery Care/Counseling Consult Complete  Palliative Care Screening Not [place] Not Applicable  Some recent data might be hidden   Eula Fried, [place], MSW, LCSW [place] LCSW [email or web address] Phone: [phone]

## 2019-05-09 NOTE — Progress Notes (Signed)
PROGRESS NOTE   Dale RocheRonald [name]  VOZ:[id number]    DOB: [birth year]    DOA: 05/07/2019  PCP: Patient, No Pcp Per   I have briefly reviewed patients [facility].  Chief Complaint  Patient presents with  . Shortness of Breath    Brief Narrative:  58 year old male with PMH of chronic systolic CHF (LVEF 20-25% in May 2020), recent NSTEMI, HTN, stage II chronic kidney disease, anemia, polysubstance abuse (cocaine and alcohol) presented to ED due to progressive dyspnea, orthopnea, leg edema, nausea and vomiting and inability to keep anything down and was admitted for acute on chronic systolic CHF.  Cardiology consulted and plan right and left heart cath on 05/10/2019.  Volume status significantly improved.  Assessment & Plan:   Principal Problem:   Acute systolic CHF (congestive heart failure) (HCC) Active Problems:   Essential hypertension   Chronic kidney disease, stage II (mild)   Normochromic anemia   Acute CHF (congestive heart failure) (HCC)   Nausea & vomiting   Acute on chronic systolic CHF  TTE May 2020 showed LVEF 20-25%.  Cardiology follow-up appreciated.  Discussed with Dr. Delton SeeNelson.  Multiple admissions and ED visits for CHF.  Suspect noncompliance to medications and diet-volunteers to no salt restriction.  Treated with IV Lasix initially 60 mg every 12 hourly, changed today to 40 mg every 6 hourly, spironolactone 25 mg daily, BiDil 1 tablet 3 times daily and losartan.  -5.2 L since admission admission.  14 pound weight loss.  Volume status significantly improved and may be close to euvolemic.  IV Lasix discontinued 7/5 in preparation for cardiac cath tomorrow.  Cardiology plans right and left heart cath 7/6.  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  Patient previously declined cath.  Remains on aspirin, Plavix, statins and beta-blockers.  No angina.  Essential hypertension  Mostly controlled on carvedilol 12.5 mg  twice daily, BiDil 1 tablet 3 times daily, losartan 25 mg daily, prazosin 1 mg daily.  Monitor.  Hypokalemia  Replaced.  Magnesium normal.  Nausea and vomiting  May be related to congestive gastropathy from decompensated CHF.  Resolved.  CT abdomen and LFTs unremarkable.  Acute kidney injury on stage II chronic kidney disease  Baseline creatinine around 1.3.  Creatinine has steadily gone up to 1.58.  Likely due to IV diuresis and ongoing losartan use.  Lasix changed as noted above.  Creatinine down to 1.4.  Anemia of chronic disease  Stable  Prolonged QTC  EKG 7/3 showed QTC of 525 ms.  Avoid QT prolonging medications.  Replace K to >4 and magnesium >2.  Repeat EKG 7/4 showed QTC of 522 ms.  Reviewed EKG with cardiology on 7/5, QT is normal and QTC may not be accurate due to LBBB.  Polysubstance abuse (cocaine, alcohol & THC)  Patient states that he has not used any of these for more than a month.  UDS shows THC only.  Homeless  Social work consult.  Patient reports that he has been and will return to stay with his friend.  He has Medicaid and is looking for a place of his own.  Ectatic abdominal aorta  Noted on CT abdomen  Recommend repeating ultrasound to follow-up in 5 years.  DVT prophylaxis: Lovenox Code Status: Full Family Communication: None at bedside Disposition: To home pending clinical improvement and cardiology clearance.  Post cath if no intervention, he may be able to discharge 7/6.  However if he has stent placed  then possibly 7/7.   Consultants:  Cardiology  Procedures:  None  Antimicrobials:  None   Subjective: Denies complaints.  No chest pain or dyspnea.  Urinating well.  No leg swelling.  ROS: As above, otherwise negative.  No nausea or vomiting reported.  Objective:  Vitals:   05/08/19 0831 05/08/19 1332 05/08/19 2124 05/09/19 0442  BP: (!) 141/105 110/75 115/84 123/89  Pulse:  81 80 87  Resp:  14 16 16   Temp:   97.9 F (36.6 C) 98.1 F (36.7 C) 98.3 F (36.8 C)  TempSrc:  Oral Oral Oral  SpO2:  95% 94% 98%  Weight:    109.1 kg  Height:        Examination:  General exam: Pleasant young male, moderately built and obese lying comfortably supine in bed Respiratory system: Clear to auscultation.  No increased work of breathing. Cardiovascular system: S1 & S2 heard, RRR. No JVD, murmurs, rubs, gallops or clicks.  Trace bilateral ankle edema.  Telemetry personally reviewed: Sinus rhythm with BBB morphology.  7 beat NSVT. Gastrointestinal system: Abdomen is nondistended, soft and nontender. No organomegaly or masses felt. Normal bowel sounds heard.  Stable. Central nervous system: Alert and oriented. No focal neurological deficits. Extremities: Symmetric 5 x 5 power. Skin: No rashes, lesions or ulcers Psychiatry: Judgement and insight appear normal. Mood & affect appropriate.     Data Reviewed: I have personally reviewed following labs and imaging studies  CBC: Recent Labs  Lab 05/07/19 0353 05/07/19 0646  WBC 7.5 7.8  NEUTROABS 5.3 5.1  HGB 12.0* 11.8*  HCT 36.5* 35.7*  MCV 83.3 83.8  PLT 331 328   Basic Metabolic Panel: Recent Labs  Lab 05/07/19 0353 05/07/19 0646 05/08/19 0424 05/09/19 0418  NA 138 136 139 140  K 4.1 3.3* 3.6 3.7  CL 100 101 101 102  CO2 25 25 28 29   GLUCOSE 120* 132* 137* 98  BUN 30* 32* 36* 34*  CREATININE 1.31* 1.41* 1.58* 1.44*  CALCIUM 8.9 8.6* 8.5* 8.7*  MG 2.2 2.3 2.1  --    Liver Function Tests: Recent Labs  Lab 05/07/19 0353 05/07/19 0646  AST 37 28  ALT 39 36  ALKPHOS 111 105  BILITOT 2.0* 1.9*  PROT 7.4 7.3  ALBUMIN 3.7 3.7     Recent Results (from the past 240 hour(s))  SARS Coronavirus 2 (CEPHEID - Performed in [facility] hospital lab), Hosp Order     Status: None   Collection Time: 05/07/19  6:01 AM   Specimen: Nasopharyngeal Swab  Result Value Ref Range Status   SARS Coronavirus 2 NEGATIVE NEGATIVE Final    Comment: (NOTE)  If result is NEGATIVE SARS-CoV-2 target nucleic acids are NOT DETECTED. The SARS-CoV-2 RNA is generally detectable in upper and lower  respiratory specimens during the acute phase of infection. The lowest  concentration of SARS-CoV-2 viral copies this assay can detect is 250  copies / mL. A negative result does not preclude SARS-CoV-2 infection  and should not be used as the sole basis for treatment or other  patient management decisions.  A negative result may occur with  improper specimen collection / handling, submission of specimen other  than nasopharyngeal swab, presence of viral mutation(s) within the  areas targeted by this assay, and inadequate number of viral copies  (<250 copies / mL). A negative result must be combined with clinical  observations, patient history, and epidemiological information. If result is POSITIVE SARS-CoV-2 target nucleic acids are DETECTED. The  SARS-CoV-2 RNA is generally detectable in upper and lower  respiratory specimens dur ing the acute phase of infection.  Positive  results are indicative of active infection with SARS-CoV-2.  Clinical  correlation with patient history and other diagnostic information is  necessary to determine patient infection status.  Positive results do  not rule out bacterial infection or co-infection with other viruses. If result is PRESUMPTIVE POSTIVE SARS-CoV-2 nucleic acids MAY BE PRESENT.   A presumptive positive result was obtained on the submitted specimen  and confirmed on repeat testing.  While 2019 novel coronavirus  (SARS-CoV-2) nucleic acids may be present in the submitted sample  additional confirmatory testing may be necessary for epidemiological  and / or clinical management purposes  to differentiate between  SARS-CoV-2 and other Sarbecovirus currently known to infect humans.  If clinically indicated additional testing with an alternate test  methodology [phone]) is advised. The SARS-CoV-2 RNA is generally   detectable in upper and lower respiratory sp ecimens during the acute  phase of infection. The expected result is Negative. Fact Sheet for Patients:  StrictlyIdeas.no Fact Sheet for Healthcare Providers: BankingDealers.co.za This test is not yet approved or cleared by the [place] FDA and has been authorized for detection and/or diagnosis of SARS-CoV-2 by FDA under an Emergency Use Authorization (EUA).  This EUA will remain in effect (meaning this test can be used) for the duration of the COVID-19 declaration under Section 564(b)(1) of the Act, 21 U.S.C. section 360bbb-3(b)(1), unless the authorization is terminated or revoked sooner. Performed at [facility], [place] [address], [place], [place] [address]          Radiology Studies: No results found.      Scheduled Meds: . aspirin EC  81 mg Oral Daily  . atorvastatin  80 mg Oral Daily  . carvedilol  12.5 mg Oral BID WC  . clopidogrel  75 mg Oral Q breakfast  . enoxaparin (LOVENOX) injection  50 mg Subcutaneous Q24H  . isosorbide-hydrALAZINE  1 tablet Oral TID  . prazosin  1 mg Oral Daily  . sodium chloride flush  3 mL Intravenous Q12H  . spironolactone  25 mg Oral Daily   Continuous Infusions:   LOS: 2 days     Vernell Leep, MD, FACP, [facility]. [facility]  To contact the attending provider between 7A-7P or the covering provider during after hours 7P-7A, please log into the web site [email or web address] and access using universal [place] password for that web site. If you do not have the password, please call the hospital operator.  05/09/2019, 12:48 PM

## 2019-05-09 NOTE — Progress Notes (Signed)
Pt was awake and alert when I arrived. He said he is having a procedure tomorrow and wanted prayer. He enjoyed talking about family and social issues and his background. Pt was pleasant and appreciated the visit and prayer. Please page if additional support is needed. [place], [place]   05/09/19 2100  Clinical Encounter Type  Visited With Patient

## 2019-05-09 NOTE — Progress Notes (Signed)
Progress Note  Patient Name: Dale [name] Date of Encounter: 05/09/2019  Primary Cardiologist: Mertie Moores, MD   Subjective   The patient feels better with improved SOB.  Inpatient Medications    Scheduled Meds: . aspirin EC  81 mg Oral Daily  . atorvastatin  80 mg Oral Daily  . carvedilol  12.5 mg Oral BID WC  . clopidogrel  75 mg Oral Q breakfast  . enoxaparin (LOVENOX) injection  50 mg Subcutaneous Q24H  . furosemide  40 mg Intravenous Q6H  . isosorbide-hydrALAZINE  1 tablet Oral TID  . losartan  25 mg Oral Daily  . prazosin  1 mg Oral Daily  . sodium chloride flush  3 mL Intravenous Q12H  . spironolactone  25 mg Oral Daily   Continuous Infusions:  PRN Meds: acetaminophen **OR** acetaminophen   Vital Signs    Vitals:   05/08/19 0831 05/08/19 1332 05/08/19 2124 05/09/19 0442  BP: (!) 141/105 110/75 115/84 123/89  Pulse:  81 80 87  Resp:  14 16 16   Temp:  97.9 F (36.6 C) 98.1 F (36.7 C) 98.3 F (36.8 C)  TempSrc:  Oral Oral Oral  SpO2:  95% 94% 98%  Weight:    109.1 kg  Height:        Intake/Output Summary (Last 24 hours) at 05/09/2019 0854 Last data filed at 05/09/2019 0826 Gross per 24 hour  Intake 1239 ml  Output 6150 ml  Net -4911 ml   Last 3 Weights 05/09/2019 05/07/2019 05/07/2019  Weight (lbs) 240 lb 9.6 oz 254 lb 6.6 oz 230 lb  Weight (kg) 109.135 kg 115.4 kg 104.327 kg      Telemetry    SR - Personally Reviewed  ECG    No new tracing - Personally Reviewed  Physical Exam   GEN: No acute distress.   Neck: No JVD Cardiac: RRR, no murmurs, rubs, or gallops.  Respiratory: crackles at bases to auscultation bilaterally. GI: Soft, nontender, non-distended  MS: mild non-pitting B/L LE edema; No deformity. Neuro:  Nonfocal  Psych: Normal affect   Labs    High Sensitivity Troponin:   Recent Labs  Lab 05/07/19 0646  TROPONINIHS 41.0*  39*      Cardiac EnzymesNo results for input(s): TROPONINI in the last 168 hours. No results for  input(s): TROPIPOC in the last 168 hours.   Chemistry Recent Labs  Lab 05/07/19 0353 05/07/19 0646 05/08/19 0424 05/09/19 0418  NA 138 136 139 140  K 4.1 3.3* 3.6 3.7  CL 100 101 101 102  CO2 25 25 28 29   GLUCOSE 120* 132* 137* 98  BUN 30* 32* 36* 34*  CREATININE 1.31* 1.41* 1.58* 1.44*  CALCIUM 8.9 8.6* 8.5* 8.7*  PROT 7.4 7.3  --   --   ALBUMIN 3.7 3.7  --   --   AST 37 28  --   --   ALT 39 36  --   --   ALKPHOS 111 105  --   --   BILITOT 2.0* 1.9*  --   --   GFRNONAA >60 56* 49* 54*  GFRAA >60 >60 56* >60  ANIONGAP 13 10 10 9      Hematology Recent Labs  Lab 05/07/19 0353 05/07/19 0646  WBC 7.5 7.8  RBC 4.38 4.26  HGB 12.0* 11.8*  HCT 36.5* 35.7*  MCV 83.3 83.8  MCH 27.4 27.7  MCHC 32.9 33.1  RDW 16.0* 16.1*  PLT 331 328    BNP Recent Labs  Lab 05/07/19 0353  BNP 3,955.9*     DDimer No results for input(s): DDIMER in the last 168 hours.   Radiology    No results found.  Cardiac Studies   TTE: 03/11/2019 03/11/2019  Echo 1. The left ventricle has severely reduced systolic function, with an ejection fraction of 20-25%. The cavity size was moderately dilated. There is mildly increased left ventricular wall thickness. Left ventricular diastolic Doppler parameters are  consistent with restrictive filling. Elevated left ventricular end-diastolic pressure. 2. Diffuse hypokinesis worse in the inferior wall. 3. The right ventricle has normal systolic function. The cavity was moderately enlarged. There is no increase in right ventricular wall thickness. 4. Right atrial size was mildly dilated. 5. Mild thickening of the mitral valve leaflet. 6. The aortic valve is tricuspid. Mild thickening of the aortic valve. Mild calcification of the aortic valve.  Patient Profile     59 y.o. male with a hx of systolic CHF who is being seen today for the evaluation of CHF at the request of Dr Toniann Fail  Assessment & Plan    1. Acute on chronic systolic CHF   PT  has had multiple admissions and ER vists for CHF   Presents again, sec noncompliance with meds  / diet. I have discussed with the pt   He has evidence of atherosclerosis of aorta on CT scan   I think it would be important to do a R and L heart cath to define anatomy/pressures; rule out any potentially treatable causes for CHF    At first the pt refused  Then he said he would do it on Monday  Would recomm R and L to define anatomy    WIll keep NPO today.  - crea 1.4 -> 1.58->1.4, I will hold lasix today for a cath tomorrow.  2  HTN  - improved   3   Prolonged QT  Keep electrolytes maintained to near normal; Avoid drugs that prolong QT   Follow EKGs  4 CKD  Follow Cr closely with diuresis  5  PAD  PT with atherosclerosis of aorta  Continue statin  6  Substance abuse  Counselled on importance of avoiding cocaine, EToH given hx of CHF  For questions or updates, please contact [facility] Please consult [email or web address] for contact info under     Signed, Tobias Alexander, MD  05/09/2019, 8:54 AM

## 2019-05-10 ENCOUNTER — Encounter (HOSPITAL_COMMUNITY)
Admission: EM | Disposition: A | Discharge status: Home / Self Care | Payer: Self-pay | Source: Home / Self Care | Attending: Internal Medicine

## 2019-05-10 ENCOUNTER — Encounter (HOSPITAL_COMMUNITY): Payer: Self-pay | Admitting: Internal Medicine

## 2019-05-10 DIAGNOSIS — I2510 Atherosclerotic heart disease of native coronary artery without angina pectoris: Secondary | ICD-10-CM

## 2019-05-10 DIAGNOSIS — I4581 Long QT syndrome: Secondary | ICD-10-CM

## 2019-05-10 DIAGNOSIS — I5043 Acute on chronic combined systolic (congestive) and diastolic (congestive) heart failure: Secondary | ICD-10-CM

## 2019-05-10 HISTORY — PX: RIGHT/LEFT HEART CATH AND CORONARY ANGIOGRAPHY: CATH118266

## 2019-05-10 LAB — POCT I-STAT 7, (LYTES, BLD GAS, ICA,H+H)
Bicarbonate: 23 mmol/L (ref 20.0–28.0)
Calcium, Ion: 1.17 mmol/L (ref 1.15–1.40)
HCT: 35 % — ABNORMAL LOW (ref 39.0–52.0)
Hemoglobin: 11.9 g/dL — ABNORMAL LOW (ref 13.0–17.0)
O2 Saturation: 98 %
Potassium: 3.8 mmol/L (ref 3.5–5.1)
Sodium: 140 mmol/L (ref 135–145)
TCO2: 24 mmol/L (ref 22–32)
pCO2 arterial: 30.4 mmHg — ABNORMAL LOW (ref 32.0–48.0)
pH, Arterial: 7.487 — ABNORMAL HIGH (ref 7.350–7.450)
pO2, Arterial: 101 mmHg (ref 83.0–108.0)

## 2019-05-10 LAB — POCT I-STAT EG7
Acid-Base Excess: 1 mmol/L (ref 0.0–2.0)
Acid-Base Excess: 1 mmol/L (ref 0.0–2.0)
Acid-Base Excess: 2 mmol/L (ref 0.0–2.0)
Bicarbonate: 25.8 mmol/L (ref 20.0–28.0)
Bicarbonate: 25.8 mmol/L (ref 20.0–28.0)
Bicarbonate: 26 mmol/L (ref 20.0–28.0)
Calcium, Ion: 1.19 mmol/L (ref 1.15–1.40)
Calcium, Ion: 1.19 mmol/L (ref 1.15–1.40)
Calcium, Ion: 1.22 mmol/L (ref 1.15–1.40)
HCT: 34 % — ABNORMAL LOW (ref 39.0–52.0)
HCT: 35 % — ABNORMAL LOW (ref 39.0–52.0)
HCT: 35 % — ABNORMAL LOW (ref 39.0–52.0)
Hemoglobin: 11.6 g/dL — ABNORMAL LOW (ref 13.0–17.0)
Hemoglobin: 11.9 g/dL — ABNORMAL LOW (ref 13.0–17.0)
Hemoglobin: 11.9 g/dL — ABNORMAL LOW (ref 13.0–17.0)
O2 Saturation: 72 %
O2 Saturation: 74 %
O2 Saturation: 75 %
Potassium: 3.8 mmol/L (ref 3.5–5.1)
Potassium: 3.8 mmol/L (ref 3.5–5.1)
Potassium: 3.9 mmol/L (ref 3.5–5.1)
Sodium: 140 mmol/L (ref 135–145)
Sodium: 141 mmol/L (ref 135–145)
Sodium: 141 mmol/L (ref 135–145)
TCO2: 27 mmol/L (ref 22–32)
TCO2: 27 mmol/L (ref 22–32)
TCO2: 27 mmol/L (ref 22–32)
pCO2, Ven: 39 mmHg — ABNORMAL LOW (ref 44.0–60.0)
pCO2, Ven: 39.4 mmHg — ABNORMAL LOW (ref 44.0–60.0)
pCO2, Ven: 39.6 mmHg — ABNORMAL LOW (ref 44.0–60.0)
pH, Ven: 7.424 (ref 7.250–7.430)
pH, Ven: 7.425 (ref 7.250–7.430)
pH, Ven: 7.428 (ref 7.250–7.430)
pO2, Ven: 37 mmHg (ref 32.0–45.0)
pO2, Ven: 38 mmHg (ref 32.0–45.0)
pO2, Ven: 39 mmHg (ref 32.0–45.0)

## 2019-05-10 LAB — CBC
HCT: 35.2 % — ABNORMAL LOW (ref 39.0–52.0)
Hemoglobin: 11.2 g/dL — ABNORMAL LOW (ref 13.0–17.0)
MCH: 27.3 pg (ref 26.0–34.0)
MCHC: 31.8 g/dL (ref 30.0–36.0)
MCV: 85.6 fL (ref 80.0–100.0)
Platelets: 312 10*3/uL (ref 150–400)
RBC: 4.11 MIL/uL — ABNORMAL LOW (ref 4.22–5.81)
RDW: 17 % — ABNORMAL HIGH (ref 11.5–15.5)
WBC: 6.1 10*3/uL (ref 4.0–10.5)
nRBC: 0 % (ref 0.0–0.2)

## 2019-05-10 LAB — BASIC METABOLIC PANEL
Anion gap: 9 (ref 5–15)
BUN: 37 mg/dL — ABNORMAL HIGH (ref 6–20)
CO2: 26 mmol/L (ref 22–32)
Calcium: 8.6 mg/dL — ABNORMAL LOW (ref 8.9–10.3)
Chloride: 103 mmol/L (ref 98–111)
Creatinine, Ser: 1.34 mg/dL — ABNORMAL HIGH (ref 0.61–1.24)
GFR calc Af Amer: >60 mL/min (ref >60)
GFR calc non Af Amer: 59 mL/min — ABNORMAL LOW (ref >60)
Glucose, Bld: 103 mg/dL — ABNORMAL HIGH (ref 70–99)
Potassium: 3.9 mmol/L (ref 3.5–5.1)
Sodium: 138 mmol/L (ref 135–145)

## 2019-05-10 SURGERY — RIGHT/LEFT HEART CATH AND CORONARY ANGIOGRAPHY
Anesthesia: LOCAL

## 2019-05-10 MED ORDER — SODIUM CHLORIDE 0.9 % IV SOLN: 250.0000 mL | INTRAVENOUS | Status: DC | PRN

## 2019-05-10 MED ORDER — SODIUM CHLORIDE 0.9 % IV SOLN
INTRAVENOUS | Status: AC | PRN
  Administered 2019-05-10: 10 mL/h via INTRAVENOUS

## 2019-05-10 MED ORDER — HEPARIN (PORCINE) IN NACL 1000-0.9 UT/500ML-% IV SOLN
INTRAVENOUS | Status: AC
  Filled 2019-05-10: qty 1000

## 2019-05-10 MED ORDER — HEPARIN SODIUM (PORCINE) 1000 UNIT/ML IJ SOLN
INTRAMUSCULAR | Status: AC
  Filled 2019-05-10: qty 1

## 2019-05-10 MED ORDER — HEPARIN (PORCINE) IN NACL 1000-0.9 UT/500ML-% IV SOLN
INTRAVENOUS | Status: DC | PRN
  Administered 2019-05-10 (×2): 500 mL

## 2019-05-10 MED ORDER — LIDOCAINE HCL (PF) 1 % IJ SOLN
INTRAMUSCULAR | Status: AC
  Filled 2019-05-10: qty 30

## 2019-05-10 MED ORDER — MIDAZOLAM HCL 2 MG/2ML IJ SOLN
INTRAMUSCULAR | Status: AC
  Filled 2019-05-10: qty 2

## 2019-05-10 MED ORDER — SODIUM CHLORIDE 0.9% FLUSH: 3.0000 mL | INTRAVENOUS | Status: DC | PRN

## 2019-05-10 MED ORDER — FENTANYL CITRATE (PF) 100 MCG/2ML IJ SOLN
INTRAMUSCULAR | Status: AC
  Filled 2019-05-10: qty 2

## 2019-05-10 MED ORDER — MIDAZOLAM HCL 2 MG/2ML IJ SOLN
INTRAMUSCULAR | Status: DC | PRN
  Administered 2019-05-10: 1 mg via INTRAVENOUS

## 2019-05-10 MED ORDER — VERAPAMIL HCL 2.5 MG/ML IV SOLN
INTRAVENOUS | Status: AC
  Filled 2019-05-10: qty 2

## 2019-05-10 MED ORDER — HYDRALAZINE HCL 20 MG/ML IJ SOLN: 10.0000 mg | INTRAMUSCULAR | Status: AC | PRN

## 2019-05-10 MED ORDER — LABETALOL HCL 5 MG/ML IV SOLN
10.0000 mg | INTRAVENOUS | Status: AC | PRN
  Filled 2019-05-10: qty 4

## 2019-05-10 MED ORDER — ENOXAPARIN SODIUM 40 MG/0.4ML ~~LOC~~ SOLN
40.0000 mg | SUBCUTANEOUS | Status: DC
  Administered 2019-05-11: 40 mg via SUBCUTANEOUS
  Filled 2019-05-10: qty 0.4

## 2019-05-10 MED ORDER — FUROSEMIDE 10 MG/ML IJ SOLN
40.0000 mg | Freq: Two times a day (BID) | INTRAMUSCULAR | Status: DC
  Administered 2019-05-10 – 2019-05-11 (×2): 40 mg via INTRAVENOUS
  Filled 2019-05-10 (×2): qty 4

## 2019-05-10 MED ORDER — FENTANYL CITRATE (PF) 100 MCG/2ML IJ SOLN
INTRAMUSCULAR | Status: DC | PRN
  Administered 2019-05-10: 50 ug via INTRAVENOUS

## 2019-05-10 MED ORDER — LIDOCAINE HCL (PF) 1 % IJ SOLN
INTRAMUSCULAR | Status: DC | PRN
  Administered 2019-05-10: 1 mL
  Administered 2019-05-10: 2 mL

## 2019-05-10 MED ORDER — IOHEXOL 350 MG/ML SOLN
INTRAVENOUS | Status: DC | PRN
  Administered 2019-05-10: 65 mL via INTRAVENOUS

## 2019-05-10 MED ORDER — SODIUM CHLORIDE 0.9% FLUSH
3.0000 mL | Freq: Two times a day (BID) | INTRAVENOUS | Status: DC
  Administered 2019-05-10 – 2019-05-11 (×3): 3 mL via INTRAVENOUS

## 2019-05-10 MED ORDER — HEPARIN SODIUM (PORCINE) 1000 UNIT/ML IJ SOLN
INTRAMUSCULAR | Status: DC | PRN
  Administered 2019-05-10: 5000 [IU] via INTRAVENOUS

## 2019-05-10 MED ORDER — VERAPAMIL HCL 2.5 MG/ML IV SOLN
INTRAVENOUS | Status: DC | PRN
  Administered 2019-05-10: 10 mL via INTRA_ARTERIAL

## 2019-05-10 SURGICAL SUPPLY — 12 items
CATH 5FR JL3.5 JR4 ANG PIG MP (CATHETERS) ×2 IMPLANT
CATH BALLN WEDGE 5F 110CM (CATHETERS) ×2 IMPLANT
COVER DOME SNAP 22 D (MISCELLANEOUS) ×4 IMPLANT
DEVICE RAD COMP TR BAND LRG (VASCULAR PRODUCTS) ×2 IMPLANT
GLIDESHEATH SLEND SS 6F .021 (SHEATH) ×2 IMPLANT
GUIDEWIRE INQWIRE 1.5J.035X260 (WIRE) ×1 IMPLANT
INQWIRE 1.5J .035X260CM (WIRE) ×2
KIT HEART LEFT (KITS) ×2 IMPLANT
PACK CARDIAC CATHETERIZATION (CUSTOM PROCEDURE TRAY) ×2 IMPLANT
SHEATH GLIDE SLENDER 4/5FR (SHEATH) ×2 IMPLANT
TRANSDUCER W/STOPCOCK (MISCELLANEOUS) ×2 IMPLANT
TUBING CIL FLEX 10 FLL-RA (TUBING) ×2 IMPLANT

## 2019-05-10 NOTE — Plan of Care (Signed)
Patient states he feels much better, breathing without difficulty. Continues to deny chest pain.  Patient anxious to go home after cardiac cath but understanding that he needs more IV lasix to further diurese.

## 2019-05-10 NOTE — Progress Notes (Signed)
[facility] BAND REMOVAL  LOCATION:    Right radial  DEFLATED PER PROTOCOL:    Yes.    TIME BAND OFF / DRESSING APPLIED:    1320pm , tegaderm and gauze   SITE UPON ARRIVAL:    Level 0  SITE AFTER BAND REMOVAL:    Level 0  CIRCULATION SENSATION AND MOVEMENT:    Within Normal Limits   Yes.    COMMENTS:   Care instruction given to pt.

## 2019-05-10 NOTE — Progress Notes (Addendum)
Progress Note  Patient Name: Dale [name] Date of Encounter: 05/10/2019  Primary Cardiologist: Kristeen Miss, MD   Subjective   Pt sleeping almost flat upon my rounds. Easily aroused, denies chest discomfort or shortness of breath. Breathing is pretty much back to normal.   Inpatient Medications    Scheduled Meds: . aspirin  81 mg Oral Pre-Cath  . aspirin EC  81 mg Oral Daily  . atorvastatin  80 mg Oral Daily  . carvedilol  12.5 mg Oral BID WC  . clopidogrel  75 mg Oral Q breakfast  . enoxaparin (LOVENOX) injection  50 mg Subcutaneous Q24H  . isosorbide-hydrALAZINE  1 tablet Oral TID  . prazosin  1 mg Oral Daily  . sodium chloride flush  3 mL Intravenous Q12H  . spironolactone  25 mg Oral Daily   Continuous Infusions: . sodium chloride    . sodium chloride Stopped (05/10/19 0157)   PRN Meds: sodium chloride, acetaminophen **OR** acetaminophen, sodium chloride flush   Vital Signs    Vitals:   05/09/19 1453 05/09/19 1554 05/09/19 2201 05/10/19 0524  BP: (!) 99/55 (!) 96/56 113/75 116/85  Pulse: 85  86 84  Resp: 20  20 18   Temp: 98.4 F (36.9 C)  98 F (36.7 C) 98.4 F (36.9 C)  TempSrc: Oral  Oral Oral  SpO2: 97%  100% 99%  Weight:    110.7 kg  Height:        Intake/Output Summary (Last 24 hours) at 05/10/2019 0843 Last data filed at 05/10/2019 0524 Gross per 24 hour  Intake 15.37 ml  Output 200 ml  Net -184.63 ml   Last 3 Weights 05/10/2019 05/09/2019 05/07/2019  Weight (lbs) 244 lb 240 lb 9.6 oz 254 lb 6.6 oz  Weight (kg) 110.678 kg 109.135 kg 115.4 kg      Telemetry    SR in the 80's - Personally Reviewed  ECG    Normal sinus rhythm, 86 bpm, possible incomplete LBBB, borderline left atrial enlargement, T wave inversion in V6, I and aVL.  QTc by my calculation 431.  - Personally Reviewed  Physical Exam   GEN: No acute distress.   Neck: No JVD Cardiac: RRR, no murmurs, rubs, or gallops.  Respiratory: Clear to auscultation bilaterally. GI: Soft,  nontender, non-distended  MS: No edema; No deformity. Neuro:  Nonfocal  Psych: Normal affect   Labs    High Sensitivity Troponin:   Recent Labs  Lab 05/07/19 0646  TROPONINIHS 41.0*  39*      Cardiac EnzymesNo results for input(s): TROPONINI in the last 168 hours. No results for input(s): TROPIPOC in the last 168 hours.   Chemistry Recent Labs  Lab 05/07/19 0353 05/07/19 0646 05/08/19 0424 05/09/19 0418 05/10/19 0424  NA 138 136 139 140 138  K 4.1 3.3* 3.6 3.7 3.9  CL 100 101 101 102 103  CO2 25 25 28 29 26   GLUCOSE 120* 132* 137* 98 103*  BUN 30* 32* 36* 34* 37*  CREATININE 1.31* 1.41* 1.58* 1.44* 1.34*  CALCIUM 8.9 8.6* 8.5* 8.7* 8.6*  PROT 7.4 7.3  --   --   --   ALBUMIN 3.7 3.7  --   --   --   AST 37 28  --   --   --   ALT 39 36  --   --   --   ALKPHOS 111 105  --   --   --   BILITOT 2.0* 1.9*  --   --   --  GFRNONAA >60 56* 49* 54* 59*  GFRAA >60 >60 56* >60 >60  ANIONGAP 13 10 10 9 9      Hematology Recent Labs  Lab 05/07/19 0353 05/07/19 0646 05/10/19 0424  WBC 7.5 7.8 6.1  RBC 4.38 4.26 4.11*  HGB 12.0* 11.8* 11.2*  HCT 36.5* 35.7* 35.2*  MCV 83.3 83.8 85.6  MCH 27.4 27.7 27.3  MCHC 32.9 33.1 31.8  RDW 16.0* 16.1* 17.0*  PLT 331 328 312    BNP Recent Labs  Lab 05/07/19 0353  BNP 3,955.9*     DDimer No results for input(s): DDIMER in the last 168 hours.   Radiology    No results found.  Cardiac Studies   03/11/2019 Echo 1. The left ventricle has severely reduced systolic function, with an ejection fraction of 20-25%. The cavity size was moderately dilated. There is mildly increased left ventricular wall thickness. Left ventricular diastolic Doppler parameters are  consistent with restrictive filling. Elevated left ventricular end-diastolic pressure. 2. Diffuse hypokinesis worse in the inferior wall. 3. The right ventricle has normal systolic function. The cavity was moderately enlarged. There is no increase in right ventricular  wall thickness. 4. Right atrial size was mildly dilated. 5. Mild thickening of the mitral valve leaflet. 6. The aortic valve is tricuspid. Mild thickening of the aortic valve. Mild calcification of the aortic valve.  Patient Profile     58 y.o. male with a hx of systolic CHF(EF 06%),[id number], cocaine and alcohol abuse and noncompliance who is being seen today for the evaluation of CHF.  He has had multiple ED visits and hospitalizations for CHF and was offered a cardiac catheterization in May/2020 but declined.  He presented with 4 days of nausea and vomiting and shortness of breath. There was a question of noncompliance with medications and diet.  Assessment & Plan    1.  Acute on chronic systolic CHF -Patient with multiple hospital and ER visits, questionable medication and diet compliance. -Patient continues on carvedilol 12.5 mg twice daily, BiDil 20-37.5 mg 3 times daily, prazosin 1 mg daily, spironolactone 25 mg daily.  ARB on hold. -Patient was diuresed with IV Lasix which was stopped yesterday In preparation for cath today.  Patient is net -5.4 L since admission.  Weight is down 10 pounds from max of 254 pounds to 244 pounds. -Pt appears euvolemic on exam and shortness of breath has resolved.  -Patient with evidence of atherosclerosis of the aorta on CT scan. -Plan for right and left heart cath today to define anatomy/pressures, rule out any potentially treatable causes of CHF.  2.  Hypertension -Blood pressure was soft yesterday 90s/50s.  ARB and Lasix currently on hold.  BP is stable this morning at 116/85.  3.  Prolonged QT Keep potassium greater than or equal to 4, magnesium greater than or equal to 2 -Avoid QT prolonging medications. -By my calculation QTC today is 431.   4.  CKD -Serum creatinine rose slightly to 1.58, trending down, 1.34 today.   5.  Substance abuse -May be contributing to his CHF.  Advised avoiding cocaine and excessive alcohol use.  6.   Atherosclerotic vascular disease -Atherosclerosis of the aorta noted on CT scan.  Patient is currently on aspirin 81 mg and high intensity statin.  -Last lipid panel in 09/2018, LDL 101.      For questions or updates, please contact Geraldine Please consult [email or web address] for contact info under      Signed, Daune Perch, NP  05/10/2019, 8:43  AM    History and all data above reviewed.  Patient examined.  I agree with the findings as above.  No pain.  Breathing better.  No pain.  The patient exam reveals COR:RRR  ,  Lungs: Decreased breath sounds  ,  Abd: Positive bowel sounds, no rebound no guarding, Ext No edema, right radial band in place  .  All available labs, radiology testing, previous records reviewed. Agree with documented assessment and plan.   CAD:  Cath reviewed.  Diffuse non obstructive disease and occluded RCA.  Continue with risk reduction.  ISCHEMIC CM: Unable to titrate meds at this point with hypotension.  Continue current therapy.  .  Acute on chronic systolic HF:  Discussed with the patient.  The biggest issues is probably dietary and we talked about this.  For now continue current therapy but when he goes home I will likely change to torsemide.    Davante Gerke  12:00 PM  05/10/2019

## 2019-05-10 NOTE — Progress Notes (Signed)
PROGRESS NOTE   Dale [name]  XBM:[id number]    DOB: 10/26/1961    DOA: 05/07/2019  PCP: Patient, No Pcp Per   I have briefly reviewed patients previous medical records in [facility].  Chief Complaint  Patient presents with  . Shortness of Breath    Brief Narrative:  58 year old male with PMH of chronic systolic CHF (LVEF 02-72% in May 2020), recent NSTEMI, HTN, stage II chronic kidney disease, anemia, polysubstance abuse (cocaine and alcohol) presented to ED due to progressive dyspnea, orthopnea, leg edema, nausea and vomiting and inability to keep anything down and was admitted for acute on chronic systolic CHF.  Cardiology consulted, after adequate diuresis, underwent right and left heart cath on 7/6.  Continuing IV diuresis.  Assessment & Plan:   Principal Problem:   Acute systolic CHF (congestive heart failure) (HCC) Active Problems:   Essential hypertension   Chronic kidney disease, stage II (mild)   Normochromic anemia   Acute CHF (congestive heart failure) (HCC)   Nausea & vomiting   Acute on chronic combined systolic (congestive) and diastolic (congestive) heart failure (HCC)   Acute on chronic systolic CHF/ischemic cardiomyopathy  TTE May 2020 showed LVEF 20-25%.  Cardiology follow-up appreciated.  Discussed with Dr. Meda Coffee.  Multiple admissions and ED visits for CHF.  Suspect noncompliance to medications and diet-volunteers to no salt restriction.  Treated with IV Lasix initially 60 mg every 12 hourly, changed today to 40 mg every 6 hourly, spironolactone 25 mg daily, BiDil 1 tablet 3 times daily and losartan.  -5.4 L since admission admission.  14 pound weight loss.  Volume status significantly improved and may be close to euvolemic.  IV Lasix discontinued 7/5 in preparation for cardiac cath  S/p left and right heart cath 7/6 shows diffuse nonobstructive CAD and occluded RCA.  Cardiology have resumed IV Lasix 40 mg every 12 hourly.  At DC, torsemide.   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  Patient previously declined cath.  Remains on aspirin, Plavix, statins and beta-blockers.  No angina.   Cath results as noted below.  Essential hypertension  Mostly controlled on carvedilol 12.5 mg twice daily, BiDil 1 tablet 3 times daily, losartan 25 mg daily, prazosin 1 mg daily.  Monitor.  Hypokalemia  Replaced.  Magnesium normal.  Nausea and vomiting  May be related to congestive gastropathy from decompensated CHF.  Resolved.  CT abdomen and LFTs unremarkable.  Acute kidney injury on stage II chronic kidney disease  Baseline creatinine around 1.3.  Creatinine has steadily gone up to 1.58.  Likely due to IV diuresis and ongoing losartan use.  Lasix changed as noted above.  Creatinine down to 1.34  Anemia of chronic disease  Stable  Prolonged QTC  EKG 7/3 showed QTC of 525 ms.  Avoid QT prolonging medications.  Replace K to >4 and magnesium >2.  Repeat EKG 7/4 showed QTC of 522 ms.  Reviewed EKG with cardiology on 7/5, QT is normal and QTC may not be accurate due to LBBB.  As per cardiology follow-up today, calculated QTC is 431.  Polysubstance abuse (cocaine, alcohol & THC)  Patient states that he has not used any of these for more than a month.  UDS shows THC only.  Homeless  Social work consult.  Patient reports that he has been and will return to stay with his friend.  He has Medicaid and is looking for a place of his own.  Ectatic abdominal aorta  Noted  on CT abdomen  Recommend repeating ultrasound to follow-up in 5 years.  DVT prophylaxis: Lovenox Code Status: Full Family Communication: None at bedside Disposition: DC home pending further clinical improvement and cardiology clearance.   Consultants:  Cardiology  Procedures:  Right and left heart cath 7/6:  Conclusions: 8. Severe single-vessel coronary artery disease with chronic total occlusion of the proximal RCA.  9. Mild to moderate, non-obstructive coronary artery disease involving the LAD and LCx. 10. Moderately elevated left heart filling pressures. 11. Severely elevated right heart filling pressures. 12. Mild pulmonary hypertension. 13. Normal Fick cardiac output/index.  Recommendations: 3. 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  [facility] after recovery from today's procedure. 4. Continue diuresis and optimization of evidence-based heart failure therapy.  I will restart furosemide 40 mg IV BID this afternoon. 5. Secondary prevention of coronary artery disease, including high-intensity statin therapy and avoidance of cocaine.  Antimicrobials:  None   Subjective: Patient seen this morning prior to cath.  Denied complaints.  Denied dyspnea.  No chest pain reported.  Objective:  Vitals:   05/10/19 1310 05/10/19 1325 05/10/19 1340 05/10/19 1415  BP: 114/80 125/73 126/88 116/84  Pulse: 88 83 81 83  Resp: (!) 22 (!) 24 (!) 29 16  Temp:    (!) 97.4 F (36.3 C)  TempSrc:    Oral  SpO2: 99% 98% 99% 100%  Weight:      Height:        Examination:  General exam: Pleasant young male, moderately built and obese lying comfortably supine in bed Respiratory system: Clear to auscultation.  No increased work of breathing. Cardiovascular system: S1 & S2 heard, RRR.  No JVD, murmurs, rubs, gallops or clicks.  No pedal edema.  Telemetry personally reviewed: SR with BBB morphology.  NSVT. Gastrointestinal system: Abdomen is nondistended, soft and nontender. No organomegaly or masses felt. Normal bowel sounds heard.  Stable. Central nervous system: Alert and oriented. No focal neurological deficits. Extremities: Symmetric 5 x 5 power. Skin: No rashes, lesions or ulcers Psychiatry: Judgement and insight appear normal. Mood & affect appropriate.     Data Reviewed: I have personally reviewed following labs and imaging studies   CBC: Recent Labs  Lab 05/07/19 0353 05/07/19 0646 05/10/19 0424  WBC 7.5 7.8 6.1  NEUTROABS 5.3 5.1  --   HGB 12.0* 11.8* 11.2*  HCT 36.5* 35.7* 35.2*  MCV 83.3 83.8 85.6  PLT 331 328 312   Basic Metabolic Panel: Recent Labs  Lab 05/07/19 0353 05/07/19 0646 05/08/19 0424 05/09/19 0418 05/10/19 0424  NA 138 136 139 140 138  K 4.1 3.3* 3.6 3.7 3.9  CL 100 101 101 102 103  CO2 25 25 28 29 26   GLUCOSE 120* 132* 137* 98 103*  BUN 30* 32* 36* 34* 37*  CREATININE 1.31* 1.41* 1.58* 1.44* 1.34*  CALCIUM 8.9 8.6* 8.5* 8.7* 8.6*  MG 2.2 2.3 2.1  --   --    Liver Function Tests: Recent Labs  Lab 05/07/19 0353 05/07/19 0646  AST 37 28  ALT 39 36  ALKPHOS 111 105  BILITOT 2.0* 1.9*  PROT 7.4 7.3  ALBUMIN 3.7 3.7     Recent Results (from the past 240 hour(s))  SARS Coronavirus 2 (CEPHEID - Performed in [facility] lab), Hosp Order     Status: None   Collection Time: 05/07/19  6:01 AM   Specimen: Nasopharyngeal Swab  Result Value Ref Range Status  SARS Coronavirus 2 NEGATIVE NEGATIVE Final    Comment: (NOTE) If result is NEGATIVE SARS-CoV-2 target nucleic acids are NOT DETECTED. The SARS-CoV-2 RNA is generally detectable in upper and lower  respiratory specimens during the acute phase of infection. The lowest  concentration of SARS-CoV-2 viral copies this assay can detect is 250  copies / mL. A negative result does not preclude SARS-CoV-2 infection  and should not be used as the sole basis for treatment or other  patient management decisions.  A negative result may occur with  improper specimen collection / handling, submission of specimen other  than nasopharyngeal swab, presence of viral mutation(s) within the  areas targeted by this assay, and inadequate number of viral copies  (<250 copies / mL). A negative result must be combined with clinical  observations, patient history, and epidemiological information. If result is POSITIVE SARS-CoV-2 target  nucleic acids are DETECTED. The SARS-CoV-2 RNA is generally detectable in upper and lower  respiratory specimens dur ing the acute phase of infection.  Positive  results are indicative of active infection with SARS-CoV-2.  Clinical  correlation with patient history and other diagnostic information is  necessary to determine patient infection status.  Positive results do  not rule out bacterial infection or co-infection with other viruses. If result is PRESUMPTIVE POSTIVE SARS-CoV-2 nucleic acids MAY BE PRESENT.   A presumptive positive result was obtained on the submitted specimen  and confirmed on repeat testing.  While 2019 novel coronavirus  (SARS-CoV-2) nucleic acids may be present in the submitted sample  additional confirmatory testing may be necessary for epidemiological  and / or clinical management purposes  to differentiate between  SARS-CoV-2 and other Sarbecovirus currently known to infect humans.  If clinically indicated additional testing with an alternate test  methodology [phone]([id number]) is advised. The SARS-CoV-2 RNA is generally  detectable in upper and lower respiratory sp ecimens during the acute  phase of infection. The expected result is Negative. Fact Sheet for Patients:  BoilerBrush.com.cyhttps://www.fda.gov/media/136312/download Fact Sheet for Healthcare Providers: https://pope.com/https://www.fda.gov/media/136313/download This test is not yet approved or cleared by the [place] FDA and has been authorized for detection and/or diagnosis of SARS-CoV-2 by FDA under an Emergency Use Authorization (EUA).  This EUA will remain in effect (meaning this test can be used) for the duration of the COVID-19 declaration under Section 564(b)(1) of the Act, 21 U.S.C. section 360bbb-3(b)(1), unless the authorization is terminated or revoked sooner. Performed at [facility], [address] [place], [place] [id number]          Radiology Studies: No results found.      Scheduled  Meds: . aspirin EC  81 mg Oral Daily  . atorvastatin  80 mg Oral Daily  . carvedilol  12.5 mg Oral BID WC  . clopidogrel  75 mg Oral Q breakfast  . [START ON 05/11/2019] enoxaparin (LOVENOX) injection  40 mg Subcutaneous Q24H  . furosemide  40 mg Intravenous BID  . isosorbide-hydrALAZINE  1 tablet Oral TID  . prazosin  1 mg Oral Daily  . sodium chloride flush  3 mL Intravenous Q12H  . sodium chloride flush  3 mL Intravenous Q12H  . spironolactone  25 mg Oral Daily   Continuous Infusions: . sodium chloride       LOS: 3 days     Marcellus ScottAnand Myalee Stengel, MD, FACP, [facility]. [facility]  To contact the attending provider between 7A-7P or the covering provider during after hours 7P-7A, please log into the web site  [email or web address] and access using universal [place] password for that web site. If you do not have the password, please call the hospital operator.  05/10/2019, 4:52 PM

## 2019-05-10 NOTE — Interval H&P Note (Signed)
History and Physical Interval Note:  05/10/2019 10:33 AM  Dale [name]  has presented today for cardiac catheterization, with the diagnosis of acute on chronic systolic heart failure.  The various methods of treatment have been discussed with the patient and family. After consideration of risks, benefits and other options for treatment, the patient has consented to  Procedure(s): RIGHT/LEFT HEART CATH AND CORONARY ANGIOGRAPHY (N/A) as a surgical intervention.  The patient's history has been reviewed, patient examined, no change in status, stable for surgery.  I have reviewed the patient's chart and labs.  Questions were answered to the patient's satisfaction.    Cath Lab Visit (complete for each Cath Lab visit)  Clinical Evaluation Leading to the Procedure:   ACS: No.  Non-ACS:    Anginal/Heart Failure Classification: CCS IV  Anti-ischemic medical therapy: Maximal Therapy (2 or more classes of medications)  Non-Invasive Test Results: No non-invasive testing performed (LVEF 20-25% by echo - high risk)  Prior CABG: No previous CABG  Dale [name]

## 2019-05-11 ENCOUNTER — Telehealth: Payer: Self-pay | Admitting: Physician Assistant

## 2019-05-11 LAB — BASIC METABOLIC PANEL
Anion gap: 6 (ref 5–15)
BUN: 32 mg/dL — ABNORMAL HIGH (ref 6–20)
CO2: 25 mmol/L (ref 22–32)
Calcium: 8.5 mg/dL — ABNORMAL LOW (ref 8.9–10.3)
Chloride: 106 mmol/L (ref 98–111)
Creatinine, Ser: 1.14 mg/dL (ref 0.61–1.24)
GFR calc Af Amer: >60 mL/min (ref >60)
GFR calc non Af Amer: >60 mL/min (ref >60)
Glucose, Bld: 101 mg/dL — ABNORMAL HIGH (ref 70–99)
Potassium: 3.9 mmol/L (ref 3.5–5.1)
Sodium: 137 mmol/L (ref 135–145)

## 2019-05-11 MED ORDER — ATORVASTATIN CALCIUM 80 MG PO TABS: 80.0000 mg | ORAL_TABLET | Freq: Every day | ORAL | 0 refills | Status: DC

## 2019-05-11 MED ORDER — SPIRONOLACTONE 25 MG PO TABS: 25.0000 mg | ORAL_TABLET | Freq: Every day | ORAL | 0 refills | Status: DC

## 2019-05-11 MED ORDER — TORSEMIDE 20 MG PO TABS: 20.0000 mg | ORAL_TABLET | Freq: Every day | ORAL | 0 refills | Status: DC

## 2019-05-11 MED ORDER — CARVEDILOL 12.5 MG PO TABS: 12.5000 mg | ORAL_TABLET | Freq: Two times a day (BID) | ORAL | 0 refills | Status: DC

## 2019-05-11 MED ORDER — ISOSORB DINITRATE-HYDRALAZINE 20-37.5 MG PO TABS: 1.0000 | ORAL_TABLET | Freq: Three times a day (TID) | ORAL | 0 refills | Status: DC

## 2019-05-11 MED ORDER — CLOPIDOGREL BISULFATE 75 MG PO TABS: 75.0000 mg | ORAL_TABLET | Freq: Every day | ORAL | 0 refills | Status: DC

## 2019-05-11 MED ORDER — PRAZOSIN HCL 1 MG PO CAPS: 1.0000 mg | ORAL_CAPSULE | Freq: Every day | ORAL | 0 refills | Status: DC

## 2019-05-11 MED ORDER — TORSEMIDE 20 MG PO TABS
20.0000 mg | ORAL_TABLET | Freq: Every day | ORAL | Status: DC
  Filled 2019-05-11: qty 1

## 2019-05-11 MED ORDER — ASPIRIN 81 MG PO TBEC: 81.0000 mg | DELAYED_RELEASE_TABLET | Freq: Every day | ORAL | 0 refills | Status: DC

## 2019-05-11 NOTE — Discharge Summary (Signed)
Discharge Summary  Dale [name] [id number]:[id number] DOB: [birth year]  PCP: Patient, No Pcp Per  Admit date: 05/07/2019 Discharge date: 05/11/2019  Time spent: 35 minutes   Recommendations for Outpatient Follow-up:  1. Follow up with cardiology 2. Follow up with your PCP 3. Take your medications as prescribed 4. Completely abstain from cocaine, THC and polysubstance abuse.  Discharge Diagnoses:  Active Hospital Problems   Diagnosis Date Noted  . Acute systolic CHF (congestive heart failure) ([place]) 09/27/2018  . Acute on chronic combined systolic (congestive) and diastolic (congestive) heart failure ([place])   . Acute CHF (congestive heart failure) ([place]) 05/07/2019  . Nausea & vomiting 05/07/2019  . Chronic kidney disease, stage II (mild) 09/28/2018  . Normochromic anemia 09/28/2018  . Essential hypertension 03/11/2016    Resolved Hospital Problems  No resolved problems to display.    Discharge Condition: Stable  Diet recommendation: Low sodium diet.  Vitals:   05/10/19 1935 05/11/19 0530  BP: 107/71 135/90  Pulse: 86 87  Resp: 16 18  Temp: 97.6 F (36.4 C) (!) 97.5 F (36.4 C)  SpO2: 98% 98%    History of present illness:  58 year old male with PMH of chronic systolic CHF (LVEF 44-81% in May 2020), recent NSTEMI, HTN, stage II chronic kidney disease, anemia, polysubstance abuse (cocaine and alcohol) presented to ED due to progressive dyspnea, orthopnea, leg edema, nausea and vomiting and inability to keep anything down and was admitted for acute on chronic systolic CHF. Cardiology consulted, after adequate diuresis, underwent right and leftheart cath on 7/6. Continuing IV diuresis.  05/11/19: Patient seen and examined at his bedside.  He denies chest pain, orthopnea, dyspnea at rest or palpitations.  Ongoing diuresing with net I&O -7.2 L since admission.  Cardiology signed off. Patient wants to go home.  Hospital Course:  Principal Problem:   Acute systolic CHF (congestive  heart failure) (H[facility]) Active Problems:   Essential hypertension   Chronic kidney disease, stage II (mild)   Normochromic anemia   Acute CHF (congestive heart failure) (H[facility])   Nausea & vomiting   Acute on chronic combined systolic (congestive) and diastolic (congestive) heart failure (H[facility]) Acute on chronic systolic CHF Last 2D echo revealed LVEF 20 to 25% Post right and left cath on 05/10/2019 Patient continues on carvedilol 12.5 mg twice daily, BiDil 20-37.5 mg 3 times daily, prazosin 1 mg daily, spironolactone 25 mg daily.ARB on hold. Net I&O -7.2 L since admission Currently on IV Lasix 40 mg twice daily Continue strict I's and O's and daily weight Patient wants to go home. Cardiology signed off and ok to discharge.  Coronary artery disease status post NSTEMI 02/22/2019 Continue aspirin, Plavix, Lipitor 80 mg daily, Coreg 12.5 mg twice daily  Polysubstance abuse including cocaine and THC UDS on 03/11/2019+ for cocaine UDS on 05/08/2019+ for [facility] Polysubstance cessation counseling at bedside  Hypertension Blood pressure is normotensive Continue medications as recommended by cardiology  Homelessness Case manager will be consulted to assist with disposition Patient states he will stay with a friend. Has Medicaid for his medications.  QTC prolongation Avoid QTC prolonging agents. Follow up with cardiology  Ectatic abdominal aorta  Noted on CT abdomen  Recommend repeating ultrasound to follow-up in 5 years.  DVT prophylaxis:Lovenox Code Status:Full Family Communication:None at bedside Disposition:DC home pending further clinical improvement and cardiology clearance.   Consultants: Cardiology  Procedures: Right and left heart cath 7/6:  Conclusions: 1. Severe single-vessel coronary artery disease with chronic total occlusion of the proximal RCA. 2. Mild  to moderate, non-obstructive coronary artery disease involving the LAD and LCx. 3. Moderately  elevated left heart filling pressures. 4. Severely elevated right heart filling pressures. 5. Mild pulmonary hypertension. 6. Normal Fick cardiac output/index.  Recommendations: 1. 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 [facility] after recovery from today's procedure. 2. Continue diuresis and optimization of evidence-based heart failure therapy. I will restart furosemide 40 mg IV BID this afternoon. 3. Secondary prevention of coronary artery disease, including high-intensity statin therapy and avoidance of cocaine.  Antimicrobials: None  Discharge Exam: BP 135/90 (BP Location: Right Arm)   Pulse 87   Temp (!) 97.5 F (36.4 C) (Oral)   Resp 18   Ht 5\' 11"  (1.803 m)   Wt 110 kg   SpO2 98%   BMI 33.84 kg/m  . General: 58 y.o. year-old male well developed well nourished in no acute distress.  Alert and oriented x3. . Cardiovascular: Regular rate and rhythm with no rubs or gallops.  No thyromegaly or JVD noted.   Marland Kitchen. Respiratory: Clear to auscultation with no wheezes or rales. Good inspiratory effort. . Abdomen: Soft nontender nondistended with normal bowel sounds x4 quadrants. . Musculoskeletal: Trace lower extremity edema. 2/4 pulses in all 4 extremities. Marland Kitchen. Psychiatry: Mood is appropriate for condition and setting  Discharge Instructions You were cared for by a hospitalist during your hospital stay. If you have any questions about your discharge medications or the care you received while you were in the hospital after you are discharged, you can call the unit and asked to speak with the hospitalist on call if the hospitalist that took care of you is not available. Once you are discharged, your primary care physician will handle any further medical issues. Please note that NO REFILLS for any discharge medications will be authorized once you are discharged, as it is imperative that you return to your primary  care physician (or establish a relationship with a primary care physician if you do not have one) for your aftercare needs so that they can reassess your need for medications and monitor your lab values.   Allergies as of 05/11/2019      Reactions   Hydrocodone Itching      Medication List    STOP taking these medications   diclofenac 75 MG EC tablet Commonly known as: VOLTAREN   diclofenac sodium 1 % Gel Commonly known as: Voltaren   furosemide 40 MG tablet Commonly known as: LASIX   losartan 25 MG tablet Commonly known as: COZAAR   tiZANidine 4 MG tablet Commonly known as: ZANAFLEX     TAKE these medications   aspirin 81 MG EC tablet Take 1 tablet (81 mg total) by mouth daily.   atorvastatin 80 MG tablet Commonly known as: Lipitor Take 1 tablet (80 mg total) by mouth daily.   carvedilol 12.5 MG tablet Commonly known as: COREG Take 1 tablet (12.5 mg total) by mouth 2 (two) times daily with a meal.   clopidogrel 75 MG tablet Commonly known as: PLAVIX Take 1 tablet (75 mg total) by mouth daily.   isosorbide-hydrALAZINE 20-37.5 MG tablet Commonly known as: BIDIL Take 1 tablet by mouth 3 (three) times daily.   prazosin 1 MG capsule Commonly known as: MINIPRESS Take 1 capsule (1 mg total) by mouth at bedtime. What changed: when to take this   spironolactone 25 MG tablet Commonly known as: ALDACTONE Take 25 mg by mouth daily.   torsemide 20  MG tablet Commonly known as: DEMADEX Take 1 tablet (20 mg total) by mouth daily.      Allergies  Allergen Reactions  . Hydrocodone Itching   Follow-up Information    [place], [place], [place] Follow up.   Specialty: Cardiology Why: Cardiology hospital follow up on Tuesday 05/18/2019 at 10:00. Please arrive 15 minutes early for check in.  Contact information: [address] [place] [address] [phone]        Nahser, Deloris Ping, MD. Call in 1 day(s).   Specialty: Cardiology Why: Please call for a  post hospital follow up appointment. Contact information: [address] [place] [address] [phone]            The results of significant diagnostics from this hospitalization (including imaging, microbiology, ancillary and laboratory) are listed below for reference.    Significant Diagnostic Studies: Dg Chest 2 View  Result Date: 04/28/2019 CLINICAL DATA:  Shortness of breath EXAM: CHEST - 2 VIEW COMPARISON:  April 11, 2019 FINDINGS: Heart size is enlarged. There is no pneumothorax. No large pleural effusion. There is vascular congestion without overt pulmonary edema. There is no acute osseous abnormality. IMPRESSION: Cardiomegaly with vascular congestion. Electronically Signed   By: Katherine Mantle M.D.   On: 04/28/2019 01:08   Dg Chest Port 1 View  Result Date: 05/07/2019 CLINICAL DATA:  Shortness of breath.  Lower extremity swelling. EXAM: PORTABLE CHEST 1 VIEW COMPARISON:  Chest x-ray dated April 28, 2019. FINDINGS: Stable cardiomegaly. Normal mediastinal contours. Unchanged mild pulmonary vascular congestion. No focal consolidation, pleural effusion, or pneumothorax. No acute osseous abnormality. IMPRESSION: Unchanged mild pulmonary vascular congestion without overt edema. Electronically Signed   By: Obie Dredge M.D.   On: 05/07/2019 04:24   Ct Renal Stone Study  Result Date: 05/07/2019 CLINICAL DATA:  Nausea and vomiting. EXAM: CT ABDOMEN AND PELVIS WITHOUT CONTRAST TECHNIQUE: Multidetector CT imaging of the abdomen and pelvis was performed following the standard protocol without IV contrast. COMPARISON:  None. FINDINGS: Lower chest: No acute abnormality.  Cardiomegaly. Hepatobiliary: No focal liver abnormality is seen. No gallstones, gallbladder wall thickening, or biliary dilatation. Pancreas: Unremarkable. No pancreatic ductal dilatation or surrounding inflammatory changes. Spleen: Normal in size without focal abnormality. Adrenals/Urinary Tract: Adrenal  glands are unremarkable. Kidneys are normal, without renal calculi, focal lesion, or hydronephrosis. Bladder is unremarkable. Stomach/Bowel: Stomach is within normal limits. Appendix appears normal. No evidence of bowel wall thickening, distention, or inflammatory changes. Vascular/Lymphatic: Ectatic infrarenal abdominal aorta measuring up to 2.9 cm. Aortic atherosclerosis. No enlarged abdominal or pelvic lymph nodes. Reproductive: Prostate is unremarkable. Other: Trace perihepatic and pelvic ascites.  No pneumoperitoneum. Musculoskeletal: Mild anasarca. No acute or significant osseous findings. IMPRESSION: 1.  No acute intra-abdominal process. 2. Trace ascites.  Mild anasarca. 3. Ectatic abdominal aorta at risk for aneurysm development. Recommend followup by ultrasound in 5 years. This recommendation follows ACR consensus guidelines: White Paper of the ACR Incidental Findings Committee II on Vascular Findings. J Am Coll Radiol 2013; 10:789-794. Aortic aneurysm NOS (ICD10-I71.9) 4.  Aortic atherosclerosis (ICD10-I70.0). Electronically Signed   By: Obie Dredge M.D.   On: 05/07/2019 06:46    Microbiology: Recent Results (from the past 240 hour(s))  SARS Coronavirus 2 (CEPHEID - Performed in [facility] hospital lab), Hosp Order     Status: None   Collection Time: 05/07/19  6:01 AM   Specimen: Nasopharyngeal Swab  Result Value Ref Range Status   SARS Coronavirus 2 NEGATIVE NEGATIVE Final  Comment: (NOTE) If result is NEGATIVE SARS-CoV-2 target nucleic acids are NOT DETECTED. The SARS-CoV-2 RNA is generally detectable in upper and lower  respiratory specimens during the acute phase of infection. The lowest  concentration of SARS-CoV-2 viral copies this assay can detect is 250  copies / mL. A negative result does not preclude SARS-CoV-2 infection  and should not be used as the sole basis for treatment or other  patient management decisions.  A negative result may occur with  improper specimen  collection / handling, submission of specimen other  than nasopharyngeal swab, presence of viral mutation(s) within the  areas targeted by this assay, and inadequate number of viral copies  (<250 copies / mL). A negative result must be combined with clinical  observations, patient history, and epidemiological information. If result is POSITIVE SARS-CoV-2 target nucleic acids are DETECTED. The SARS-CoV-2 RNA is generally detectable in upper and lower  respiratory specimens dur ing the acute phase of infection.  Positive  results are indicative of active infection with SARS-CoV-2.  Clinical  correlation with patient history and other diagnostic information is  necessary to determine patient infection status.  Positive results do  not rule out bacterial infection or co-infection with other viruses. If result is PRESUMPTIVE POSTIVE SARS-CoV-2 nucleic acids MAY BE PRESENT.   A presumptive positive result was obtained on the submitted specimen  and confirmed on repeat testing.  While 2019 novel coronavirus  (SARS-CoV-2) nucleic acids may be present in the submitted sample  additional confirmatory testing may be necessary for epidemiological  and / or clinical management purposes  to differentiate between  SARS-CoV-2 and other Sarbecovirus currently known to infect humans.  If clinically indicated additional testing with an alternate test  methodology [id number]) is advised. The SARS-CoV-2 RNA is generally  detectable in upper and lower respiratory sp ecimens during the acute  phase of infection. The expected result is Negative. Fact Sheet for Patients:  BoilerBrush.com.cy Fact Sheet for Healthcare Providers: https://pope.com/ This test is not yet approved or cleared by the [place] FDA and has been authorized for detection and/or diagnosis of SARS-CoV-2 by FDA under an Emergency Use Authorization (EUA).  This EUA will remain in effect  (meaning this test can be used) for the duration of the COVID-19 declaration under Section 564(b)(1) of the Act, 21 U.S.C. section 360bbb-3(b)(1), unless the authorization is terminated or revoked sooner. Performed at [facility], [address] [place], [place] [address]      Labs: Basic Metabolic Panel: Recent Labs  Lab 05/07/19 0353 05/07/19 0646 05/08/19 0424 05/09/19 0418 05/10/19 0424 05/10/19 1053 05/10/19 1054 05/10/19 1057 05/10/19 1059 05/11/19 0401  NA  K 4.1 3.3* 3.6 3.7 3.9 3.8 3.9 3.8 3.8 3.9  CL 100 101 101 102 103  --   --   --   --  106  CO2 --   --   --   --  25  GLUCOSE 120* 132* 137* 98 103*  --   --   --   --  101*  BUN 30* 32* 36* 34* 37*  --   --   --   --  32*  CREATININE 1.31* 1.41* 1.58* 1.44* 1.34*  --   --   --   --  1.14  CALCIUM 8.9 8.6* 8.5* 8.7* 8.6*  --   --   --   --  8.5*  MG 2.2 2.3 2.1  --   --   --   --   --   --   --    Liver Function Tests: Recent Labs  Lab 05/07/19 0353 05/07/19 0646  AST 37 28  ALT 39 36  ALKPHOS 111 105  BILITOT 2.0* 1.9*  PROT 7.4 7.3  ALBUMIN 3.7 3.7   No results for input(s): LIPASE, AMYLASE in the last 168 hours. No results for input(s): AMMONIA in the last 168 hours. CBC: Recent Labs  Lab 05/07/19 0353 05/07/19 0646 05/10/19 0424 05/10/19 1053 05/10/19 1054 05/10/19 1057 05/10/19 1059  WBC 7.5 7.8 6.1  --   --   --   --   NEUTROABS 5.3 5.1  --   --   --   --   --   HGB 12.0* 11.8* 11.2* 11.6* 11.9* 11.9* 11.9*  HCT 36.5* 35.7* 35.2* 34.0* 35.0* 35.0* 35.0*  MCV 83.3 83.8 85.6  --   --   --   --   PLT 331 328 312  --   --   --   --    Cardiac Enzymes: No results for input(s): CKTOTAL, CKMB, CKMBINDEX, TROPONINI in the last 168 hours. BNP: BNP (last 3 results) Recent Labs    03/10/19 2309 04/11/19 0346 05/07/19 0353  BNP 3,804.7* 3,187.0* 3,955.9*    ProBNP (last 3 results) No results for input(s):  PROBNP in the last 8760 hours.  CBG: No results for input(s): GLUCAP in the last 168 hours.     Signed:  Darlin Droparole N Windie Marasco, MD [facility] 05/11/2019, 1:40 PM

## 2019-05-11 NOTE — Telephone Encounter (Signed)
TOC Patient- Please call Patient- Pt has an appointment with Vin on 05-18-19.

## 2019-05-11 NOTE — Telephone Encounter (Addendum)
**Note De-Identified  Obfuscation** The pt is currently in the hosp. Will him call once he has been discharged.

## 2019-05-11 NOTE — Progress Notes (Signed)
PROGRESS NOTE  Dale [name] KXF:[id number] DOB: 1961/08/07 DOA: 05/07/2019 PCP: Patient, No Pcp Per  HPI/Recap of past 20 hours: 58 year old male with PMH of chronic systolic CHF (LVEF 69-67% in May 2020), recent NSTEMI, HTN, stage II chronic kidney disease, anemia, polysubstance abuse (cocaine and alcohol) presented to ED due to progressive dyspnea, orthopnea, leg edema, nausea and vomiting and inability to keep anything down and was admitted for acute on chronic systolic CHF.  Cardiology consulted, after adequate diuresis, underwent right and left heart cath on 7/6.  Continuing IV diuresis.  05/11/19: Patient seen and examined at his bedside.  He denies chest pain, orthopnea, dyspnea at rest or palpitations.  Ongoing diuresing with net I&O -7.2 L since admission.  Assessment/Plan: Principal Problem:   Acute systolic CHF (congestive heart failure) (HCC) Active Problems:   Essential hypertension   Chronic kidney disease, stage II (mild)   Normochromic anemia   Acute CHF (congestive heart failure) (HCC)   Nausea & vomiting   Acute on chronic combined systolic (congestive) and diastolic (congestive) heart failure (HCC)  Acute on chronic systolic CHF Last 2D echo revealed LVEF 20 to 25% Post right and left cath on 05/10/2019 Patient continues on carvedilol 12.5 mg twice daily, BiDil 20-37.5 mg 3 times daily, prazosin 1 mg daily, spironolactone 25 mg daily.  ARB on hold. Net I&O -7.2 L since admission Currently on IV Lasix 40 mg twice daily Continue strict I's and O's and daily weight  Coronary artery disease status post NSTEMI 02/22/2019 Continue aspirin, Plavix, Lipitor 80 mg daily, Coreg 12.5 mg twice daily  Polysubstance abuse including cocaine and THC UDS on 03/11/2019+ for cocaine UDS on 05/08/2019+ for [facility] Polysubstance cessation counseling at bedside  Hypertension Blood pressure is normotensive Continue medications as recommended by cardiology  Homelessness Case manager will be  consulted to assist with disposition  QTC prolongation Avoid QTC prolonged ventilation    Ectatic abdominal aorta  Noted on CT abdomen  Recommend repeating ultrasound to follow-up in 5 years.  DVT prophylaxis: Lovenox Code Status: Full Family Communication: None at bedside Disposition: DC home pending further clinical improvement and cardiology clearance.   Consultants:  Cardiology  Procedures:  Right and left heart cath 7/6:  Conclusions: 1. Severe single-vessel coronary artery disease with chronic total occlusion of the proximal RCA. 2. Mild to moderate, non-obstructive coronary artery disease involving the LAD and LCx. 3. Moderately elevated left heart filling pressures. 4. Severely elevated right heart filling pressures. 5. Mild pulmonary hypertension. 6. Normal Fick cardiac output/index.  Recommendations: 1. 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 Patient will be transported back to [facility] after recovery from today's procedure. 2. Continue diuresis and optimization of evidence-based heart failure therapy. I will restart furosemide 40 mg IV BID this afternoon. 3. Secondary prevention of coronary artery disease, including high-intensity statin therapy and avoidance of cocaine.  Antimicrobials:  None   Objective: Vitals:   05/10/19 1415 05/10/19 1935 05/11/19 0530 05/11/19 0629  BP: 116/84 107/71 135/90   Pulse: 83 86 87   Resp: 16 16 18    Temp: (!) 97.4 F (36.3 C) 97.6 F (36.4 C) (!) 97.5 F (36.4 C)   TempSrc: Oral Oral Oral   SpO2: 100% 98% 98%   Weight:    110 kg  Height:        Intake/Output Summary (Last 24 hours) at 05/11/2019 1145 Last data filed at 05/11/2019 0539 Gross per 24 hour  Intake 596 ml  Output 2400 ml  Net -1804 ml   Filed Weights   05/09/19 0442 05/10/19 0524 05/11/19 0629  Weight: 109.1 kg 110.7 kg 110 kg    Exam:  . General: 58 y.o. year-old male well developed well  nourished in no acute distress.  Alert and oriented x3. . Cardiovascular: Regular rate and rhythm with no rubs or gallops.  No thyromegaly or JVD noted.   Marland Kitchen. Respiratory: Clear to auscultation with no wheezes or rales. Good inspiratory effort. . Abdomen: Soft nontender nondistended with normal bowel sounds x4 quadrants. . Musculoskeletal: Trace lower extremity edema. 2/4 pulses in all 4 extremities. Marland Kitchen. Psychiatry: Mood is appropriate for condition and setting   Data Reviewed: CBC: Recent Labs  Lab 05/07/19 0353 05/07/19 0646 05/10/19 0424 05/10/19 1053 05/10/19 1054 05/10/19 1057 05/10/19 1059  WBC 7.5 7.8 6.1  --   --   --   --   NEUTROABS 5.3 5.1  --   --   --   --   --   HGB 12.0* 11.8* 11.2* 11.6* 11.9* 11.9* 11.9*  HCT 36.5* 35.7* 35.2* 34.0* 35.0* 35.0* 35.0*  MCV 83.3 83.8 85.6  --   --   --   --   PLT 331 328 312  --   --   --   --    Basic Metabolic Panel: Recent Labs  Lab 05/07/19 0353 05/07/19 0646 05/08/19 0424 05/09/19 0418 05/10/19 0424 05/10/19 1053 05/10/19 1054 05/10/19 1057 05/10/19 1059 05/11/19 0401  NA 138 136 139 140 138 141 140 140 141 137   K 4.1 3.3* 3.6 3.7 3.9 3.8 3.9 3.8 3.8 3.9  CL 100 101 101 102 103  --   --   --   --  106  CO2 25 25 28 29 26   --   --   --   --  25  GLUCOSE 120* 132* 137* 98 103*  --   --   --   --  101*  BUN 30* 32* 36* 34* 37*  --   --   --   --  32*  CREATININE 1.31* 1.41* 1.58* 1.44* 1.34*  --   --   --   --  1.14  CALCIUM 8.9 8.6* 8.5* 8.7* 8.6*  --   --   --   --  8.5*  MG 2.2 2.3 2.1  --   --   --   --   --   --   --    GFR: Estimated Creatinine Clearance: 92.4 mL/min (by C-G formula based on SCr of 1.14 mg/dL). Liver Function Tests: Recent Labs  Lab 05/07/19 0353 05/07/19 0646  AST 37 28  ALT 39 36  ALKPHOS 111 105  BILITOT 2.0* 1.9*  PROT 7.4 7.3  ALBUMIN 3.7 3.7   No results for input(s): LIPASE, AMYLASE in the last 168 hours. No results for input(s): AMMONIA in the last 168 hours. Coagulation  Profile: No results for input(s): INR, PROTIME in the last 168 hours. Cardiac Enzymes: No results for input(s): CKTOTAL, CKMB, CKMBINDEX, TROPONINI in the last 168 hours. BNP (last 3 results) No results for input(s): PROBNP in the last 8760 hours. HbA1C: No results for input(s): HGBA1C in the last 72 hours. CBG: No results for input(s): GLUCAP in the last 168 hours. Lipid Profile: No results for input(s): CHOL, HDL, LDLCALC, TRIG, CHOLHDL, LDLDIRECT in the last 72 hours. Thyroid Function Tests: No results for input(s): TSH, T4TOTAL, FREET4, T3FREE, THYROIDAB in the last 72 hours. Anemia Panel: No results  for input(s): VITAMINB12, FOLATE, FERRITIN, TIBC, IRON, RETICCTPCT in the last 72 hours. Urine analysis:    Component Value Date/Time   COLORURINE COLORLESS (A) 02/28/2019 1309   APPEARANCEUR CLEAR 02/28/2019 1309   LABSPEC 1.004 (L) 02/28/2019 1309   PHURINE 7.0 02/28/2019 1309   GLUCOSEU NEGATIVE 02/28/2019 1309   HGBUR NEGATIVE 02/28/2019 1309   BILIRUBINUR NEGATIVE 02/28/2019 1309   KETONESUR NEGATIVE 02/28/2019 1309   PROTEINUR NEGATIVE 02/28/2019 1309   UROBILINOGEN 0.2 03/11/2016 0934   NITRITE NEGATIVE 02/28/2019 1309   LEUKOCYTESUR NEGATIVE 02/28/2019 1309   Sepsis Labs: @LABRCNTIP (procalcitonin:4,lacticidven:4)  ) Recent Results (from the past 240 hour(s))  SARS Coronavirus 2 (CEPHEID - Performed in [facility] hospital lab), Hosp Order     Status: None   Collection Time: 05/07/19  6:01 AM   Specimen: Nasopharyngeal Swab  Result Value Ref Range Status   SARS Coronavirus 2 NEGATIVE NEGATIVE Final    Comment: (NOTE) If result is NEGATIVE SARS-CoV-2 target nucleic acids are NOT DETECTED. The SARS-CoV-2 RNA is generally detectable in upper and lower  respiratory specimens during the acute phase of infection. The lowest  concentration of SARS-CoV-2 viral copies this assay can detect is 250  copies / mL. A negative result does not preclude SARS-CoV-2 infection   and should not be used as the sole basis for treatment or other  patient management decisions.  A negative result may occur with  improper specimen collection / handling, submission of specimen other  than nasopharyngeal swab, presence of viral mutation(s) within the  areas targeted by this assay, and inadequate number of viral copies  (<250 copies / mL). A negative result must be combined with clinical  observations, patient history, and epidemiological information. If result is POSITIVE SARS-CoV-2 target nucleic acids are DETECTED. The SARS-CoV-2 RNA is generally detectable in upper and lower  respiratory specimens dur ing the acute phase of infection.  Positive  results are indicative of active infection with SARS-CoV-2.  Clinical  correlation with patient history and other diagnostic information is  necessary to determine patient infection status.  Positive results do  not rule out bacterial infection or co-infection with other viruses. If result is PRESUMPTIVE POSTIVE SARS-CoV-2 nucleic acids MAY BE PRESENT.   A presumptive positive result was obtained on the submitted specimen  and confirmed on repeat testing.  While 2019 novel coronavirus  (SARS-CoV-2) nucleic acids may be present in the submitted sample  additional confirmatory testing may be necessary for epidemiological  and / or clinical management purposes  to differentiate between  SARS-CoV-2 and other Sarbecovirus currently known to infect humans.  If clinically indicated additional testing with an alternate test  methodology [phone]) is advised. The SARS-CoV-2 RNA is generally  detectable in upper and lower respiratory sp ecimens during the acute  phase of infection. The expected result is Negative. Fact Sheet for Patients:  BoilerBrush.com.cy Fact Sheet for Healthcare Providers: https://pope.com/ This test is not yet approved or cleared by the [place] FDA and has  been authorized for detection and/or diagnosis of SARS-CoV-2 by FDA under an Emergency Use Authorization (EUA).  This EUA will remain in effect (meaning this test can be used) for the duration of the COVID-19 declaration under Section 564(b)(1) of the Act, 21 U.S.C. section 360bbb-3(b)(1), unless the authorization is terminated or revoked sooner. Performed at [facility], [address] [place], [place] [address]       Studies: No results found.  Scheduled Meds: . aspirin EC  81 mg  Oral Daily  . atorvastatin  80 mg Oral Daily  . carvedilol  12.5 mg Oral BID WC  . clopidogrel  75 mg Oral Q breakfast  . enoxaparin (LOVENOX) injection  40 mg Subcutaneous Q24H  . furosemide  40 mg Intravenous BID  . isosorbide-hydrALAZINE  1 tablet Oral TID  . prazosin  1 mg Oral Daily  . sodium chloride flush  3 mL Intravenous Q12H  . sodium chloride flush  3 mL Intravenous Q12H  . spironolactone  25 mg Oral Daily    Continuous Infusions: . sodium chloride       LOS: 4 days     Darlin Droparole N Seibert Keeter, MD [facility] Pager [phone]  If 7PM-7AM, please contact night-coverage [email or web address] Password [email or web address] 05/11/2019, 11:45 AM

## 2019-05-11 NOTE — Progress Notes (Addendum)
Progress Note  Patient Name: Dale [name] Date of Encounter: 05/11/2019  Primary Cardiologist: Mertie Moores, MD   Subjective   Pt is feeling well today. No chest discomfort, shortness of breath or orthopnea. He is anxious to go home.   Inpatient Medications    Scheduled Meds: . aspirin EC  81 mg Oral Daily  . atorvastatin  80 mg Oral Daily  . carvedilol  12.5 mg Oral BID WC  . clopidogrel  75 mg Oral Q breakfast  . enoxaparin (LOVENOX) injection  40 mg Subcutaneous Q24H  . furosemide  40 mg Intravenous BID  . isosorbide-hydrALAZINE  1 tablet Oral TID  . prazosin  1 mg Oral Daily  . sodium chloride flush  3 mL Intravenous Q12H  . sodium chloride flush  3 mL Intravenous Q12H  . spironolactone  25 mg Oral Daily   Continuous Infusions: . sodium chloride     PRN Meds: sodium chloride, acetaminophen **OR** acetaminophen, sodium chloride flush   Vital Signs    Vitals:   05/10/19 1415 05/10/19 1935 05/11/19 0530 05/11/19 0629  BP: 116/84 107/71 135/90   Pulse: 83 86 87   Resp: 16 16 18    Temp: (!) 97.4 F (36.3 C) 97.6 F (36.4 C) (!) 97.5 F (36.4 C)   TempSrc: Oral Oral Oral   SpO2: 100% 98% 98%   Weight:    110 kg  Height:        Intake/Output Summary (Last 24 hours) at 05/11/2019 1232 Last data filed at 05/11/2019 1200 Gross per 24 hour  Intake 596 ml  Output 3300 ml  Net -2704 ml   Last 3 Weights 05/11/2019 05/10/2019 05/09/2019  Weight (lbs) 242 lb 9.6 oz 244 lb 240 lb 9.6 oz  Weight (kg) 110.043 kg 110.678 kg 109.135 kg      Telemetry    Sinus rhythm with occ PVCs - Personally Reviewed  ECG    No new tracings - Personally Reviewed  Physical Exam   GEN: No acute distress.   Neck: No JVD Cardiac: RRR, no murmurs, rubs, or gallops.  Respiratory: Clear to auscultation bilaterally. GI: Soft, nontender, non-distended  MS: No edema; No deformity. Neuro:  Nonfocal  Psych: Normal affect   Labs    High Sensitivity Troponin:   Recent Labs  Lab  05/07/19 0646  TROPONINIHS 41.0*  39*      Cardiac EnzymesNo results for input(s): TROPONINI in the last 168 hours. No results for input(s): TROPIPOC in the last 168 hours.   Chemistry Recent Labs  Lab 05/07/19 0353 05/07/19 0646  05/09/19 0418 05/10/19 0424  05/10/19 1057 05/10/19 1059 05/11/19 0401  NA 138 136   < > 140 138   < > 140 141 137  K 4.1 3.3*   < > 3.7 3.9   < > 3.8 3.8 3.9  CL 100 101   < > 102 103  --   --   --  106  CO2 25 25   < > 29 26  --   --   --  25  GLUCOSE 120* 132*   < > 98 103*  --   --   --  101*  BUN 30* 32*   < > 34* 37*  --   --   --  32*  CREATININE 1.31* 1.41*   < > 1.44* 1.34*  --   --   --  1.14  CALCIUM 8.9 8.6*   < > 8.7* 8.6*  --   --   --  8.5*  PROT 7.4 7.3  --   --   --   --   --   --   --   ALBUMIN 3.7 3.7  --   --   --   --   --   --   --   AST 37 28  --   --   --   --   --   --   --   ALT 39 36  --   --   --   --   --   --   --   ALKPHOS 111 105  --   --   --   --   --   --   --   BILITOT 2.0* 1.9*  --   --   --   --   --   --   --   GFRNONAA >60 56*   < > 54* 59*  --   --   --  >60  GFRAA >60 >60   < > >60 >60  --   --   --  >60  ANIONGAP 13 10   < > 9 9  --   --   --  6   < > = values in this interval not displayed.     Hematology Recent Labs  Lab 05/07/19 0353 05/07/19 0646 05/10/19 0424  05/10/19 1054 05/10/19 1057 05/10/19 1059  WBC 7.5 7.8 6.1  --   --   --   --   RBC 4.38 4.26 4.11*  --   --   --   --   HGB 12.0* 11.8* 11.2*   < > 11.9* 11.9* 11.9*  HCT 36.5* 35.7* 35.2*   < > 35.0* 35.0* 35.0*  MCV 83.3 83.8 85.6  --   --   --   --   MCH 27.4 27.7 27.3  --   --   --   --   MCHC 32.9 33.1 31.8  --   --   --   --   RDW 16.0* 16.1* 17.0*  --   --   --   --   PLT 331 328 312  --   --   --   --    < > = values in this interval not displayed.    BNP Recent Labs  Lab 05/07/19 0353  BNP 3,955.9*     DDimer No results for input(s): DDIMER in the last 168 hours.   Radiology    No results found.  Cardiac  Studies   RIGHT/LEFT HEART CATH AND CORONARY ANGIOGRAPHY 05/10/2019   Conclusions: 1. Severe single-vessel coronary artery disease with chronic total occlusion of the proximal RCA. 2. Mild to moderate, non-obstructive coronary artery disease involving the LAD and LCx. 3. Moderately elevated left heart filling pressures. 4. Severely elevated right heart filling pressures. 5. Mild pulmonary hypertension. 6. Normal Fick cardiac output/index.  Recommendations: 1. 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  [facility] recovery from today's procedure. 2. Continue diuresis and optimization of evidence-based heart failure therapy.  I will restart furosemide 40 mg IV BID this afternoon. 3. Secondary prevention of coronary artery disease, including high-intensity statin therapy and avoidance of cocaine.  Yvonne Kendallhristopher End, MD [facility]    03/11/2019 Echo 1. The left ventricle has severely reduced systolic function, with an ejection fraction of 20-25%. The cavity size was moderately dilated. There is mildly increased left ventricular  wall thickness. Left ventricular diastolic Doppler parameters are  consistent with restrictive filling. Elevated left ventricular end-diastolic pressure. 2. Diffuse hypokinesis worse in the inferior wall. 3. The right ventricle has normal systolic function. The cavity was moderately enlarged. There is no increase in right ventricular wall thickness. 4. Right atrial size was mildly dilated. 5. Mild thickening of the mitral valve leaflet. 6. The aortic valve is tricuspid. Mild thickening of the aortic valve. Mild calcification of the aortic valve.  Patient Profile     58 y.o. male with a hx of systolic CHF(EF 25%),Hypertension, cocaine and alcohol abuse and noncompliance who is being seen today for the evaluation of CHF.  He has had multiple ED visits and hospitalizations for CHF and  was offered a cardiac catheterization in May/2020 but declined.  He presented with 4 days of nausea and vomiting and shortness of breath. There was a question of noncompliance with medications and diet.  Assessment & Plan    Acute on chronic systolic CHF -L&R heart cath yesterday showed severe single vessel ACD with chronic total occlusion of the prox RCA, mild-mod non-obstructive CAD involving LAD and LCx. He had moderately elevated left heart filling pressures and severely elevated right heart filling pressures as well as mild pulmonary hypertension. -pt was continued on lasix 40 mg IV BID.  -On aspirin, Plavix and high intensity statin, carvedilol, BiDil, prazosin, spironolactone. Hypotension limiting titration of HF meds. ARB on hold.  -Recommend avoidance of cocaine.  -Pt had poor response to oral lasix at home, will likely need torsemide at discharge.  -Currently without HF symptoms. Down 12 lbs since admission.  -I reviewed HF S/S to report so that we can intervene and hopefully avoid hospitalizations. Pt verb understanding.  -I will arrange for hospital follow up.   Hypertension -BP has been intermittently soft. 135/90 this am, 107/71 last night.  -Could consider adding low dose ARB back.   CKD -SCr rose with diuresis. Now better at 1.14 today. Post cath and after holding lasix for a day.    For questions or updates, please contact [facility] Please consult [email or web address] for contact info under       Signed, Berton Bon, NP  05/11/2019, 12:32 PM    History and all data above reviewed.  Patient examined.  I agree with the findings as above.  The patient feels better but not quite at baseline.  Very anxious to go home.   The patient exam reveals COR:RRR  ,  Lungs: Clear  ,  Abd: Positive bowel sounds, no rebound no guarding, Ext Right radial without bruising or bleeding  .  All available labs, radiology testing, previous records reviewed. Agree with documented assessment and  plan. Acute systolic HF:  OK to discharge on meds as on MAR except I would suggest discharge on Torsemide 40 mg po BID.  We will arrange TOC follow up in our clinic.    Lenore Moyano  1:01 PM  05/11/2019

## 2019-05-12 NOTE — Telephone Encounter (Signed)
Patient contacted regarding discharge from [facility]  on 05/11/19.  Patient understands to follow up with provider Robbie Lis PA on 05/18/19 at 10am at [place]. . Patient understands discharge instructions? yes Patient understands medications and regiment? yes Patient understands to bring all medications to this visit? yes  Pt agrees to wear a mask and to avoid anyone that is or has been sick/ to use good handwashing technique.

## 2019-05-14 ENCOUNTER — Telehealth: Payer: Self-pay | Admitting: Physician Assistant

## 2019-05-17 ENCOUNTER — Telehealth: Payer: Self-pay | Admitting: Physician Assistant

## 2019-05-17 NOTE — Telephone Encounter (Signed)
New Message ° ° ° °Left message to confirm appt and answer covid questions  °

## 2019-05-18 ENCOUNTER — Ambulatory Visit: Payer: Medicaid Other | Admitting: Physician Assistant

## 2019-05-20 ENCOUNTER — Telehealth: Payer: Self-pay

## 2019-05-20 ENCOUNTER — Other Ambulatory Visit (INDEPENDENT_AMBULATORY_CARE_PROVIDER_SITE_OTHER): Payer: Self-pay | Admitting: Primary Care

## 2019-05-20 NOTE — Telephone Encounter (Signed)
Ketorolac I will send in medication

## 2019-05-20 NOTE — Telephone Encounter (Signed)
I was notified by [facility] that this patient needs a PA for his Diclofenac tablets, this is not a preferred NSAID, please consider the preferred options listed below or provide clinical reasoning for the diclofenac for me to provide with the PA.   Preferred: -ibuprofen -indomethacin -ketorolac -meloxicam -naproxen -sulindac

## 2019-05-25 ENCOUNTER — Telehealth: Payer: Self-pay | Admitting: Physician Assistant

## 2019-05-25 NOTE — Telephone Encounter (Signed)
Lvm for patient to call back about appointment  To come in office or if a certain need or preference to want  to keep the virtual appointment.

## 2019-05-25 NOTE — Progress Notes (Signed)
Virtual Visit via Telephone Note   This visit type was conducted due to national recommendations for restrictions regarding the COVID-19 Pandemic (e.g. social distancing) in an effort to limit this patient's exposure and mitigate transmission in our community.  Due to his co-morbid illnesses, this patient is at least at moderate risk for complications without adequate follow up.  This format is felt to be most appropriate for this patient at this time.  The patient did not have access to video technology/had technical difficulties with video requiring transitioning to audio format only (telephone).  All issues noted in this document were discussed and addressed.  No physical exam could be performed with this format.  Please refer to the patient's chart for his  consent to telehealth for [facility].   Date:  05/26/2019   ID:  Dale [name], DOB 03-Dec-1960, MRN [id number]  Patient Location: Home Provider Location: Office  PCP:  Kerin Perna, NP  Cardiologist:  Mertie Moores, MD   Electrophysiologist:  None   Evaluation Performed:  Follow-Up Visit  Chief Complaint:  Post hospitalization follow up - admitted for congestive heart failure   History of Present Illness:    Dale [name] is a 58 y.o. male with:  Combined systolic and diastolic CHF  Hx of multiple ED visits/Admissions for CHF in 2020  Mixed ischemic and non-ischemic CM  Echocardiogram 03/2019: EF 20-25  Coronary artery disease   Hx of NSTEMI in setting of  02/2019 >> Patient declined cardiac catheterization   Cardiac catheterization 05/2019: CTO of RCA with L-R collats; mild non-obs CAD elsewhere  Hypertension  Chronic kidney disease 2  Anemia  Cocaine use  ETOH abuse  Homelessness   Dale [name] was admitted 7/3-7/7 with decompensated CHF.  He was diuresed and agreed to undergo R and L cardiac catheterization.  This demonstrated a chronically occluded RCA with L-R collaterals and non-obstructive  disease elsewhere.    He is seen for post hospitalization follow up.  He notes he is feeling much better.  His breathing is improved.  He has not had paroxysmal nocturnal dyspnea, orthopnea, chest pain.  He feels weak but is getting better.  The site of his cardiac catheterization (his wrist) has a knot but it is improved.  He has occasional acid reflux after eating and will often vomit.  He is currently getting established with primary care.  He does admit to drinking a small amount of alcohol on occasion.  he does admit to using cocaine again.     The patient does not have symptoms concerning for COVID-19 infection (fever, chills, cough, or new shortness of breath).    Past Medical History:  Diagnosis Date  . Alcohol abuse   . Chronic combined systolic and diastolic CHF (congestive heart failure) ([place])    a. 09/27/18 showed mild LVH, EF 20-25%, grade 2 DD, mild MR, severely dilated LA, mildly dilated RV with mildly reduced RV function, mod RAE.  Marland Kitchen Cocaine abuse ([place])   . Hypertension    Past Surgical History:  Procedure Laterality Date  . RIGHT/LEFT HEART CATH AND CORONARY ANGIOGRAPHY N/A 05/10/2019   Procedure: RIGHT/LEFT HEART CATH AND CORONARY ANGIOGRAPHY;  Surgeon: Nelva Bush, MD;  Location: [place] [facility];  Service: Cardiovascular;  Laterality: N/A;     Current Meds  Medication Sig  . aspirin 81 MG EC tablet Take 1 tablet (81 mg total) by mouth daily.  Marland Kitchen atorvastatin (LIPITOR) 80 MG tablet Take 1 tablet (80 mg total) by mouth daily.  Marland Kitchen  carvedilol (COREG) 12.5 MG tablet Take 1 tablet (12.5 mg total) by mouth 2 (two) times daily with a meal.  . clopidogrel (PLAVIX) 75 MG tablet Take 1 tablet (75 mg total) by mouth daily.  . isosorbide-hydrALAZINE (BIDIL) 20-37.5 MG tablet Take 1 tablet by mouth 3 (three) times daily.  . prazosin (MINIPRESS) 1 MG capsule Take 1 capsule (1 mg total) by mouth daily.  Marland Kitchen. spironolactone (ALDACTONE) 25 MG tablet Take 1 tablet (25 mg total) by mouth  daily.  Marland Kitchen. torsemide (DEMADEX) 20 MG tablet Take 1 tablet (20 mg total) by mouth daily.     Allergies:   Hydrocodone   Social History   Tobacco Use  . Smoking status: Never Smoker  . Smokeless tobacco: Never Used  Substance Use Topics  . Alcohol use: Yes    Comment: 2 quarts a week   . Drug use: Yes    Frequency: 3.0 times per week    Types: Marijuana, Cocaine     Family Hx: The patient's family history includes Hypertension in his maternal grandmother.  ROS:   Please see the history of present illness.     All other systems reviewed and are negative.   Prior CV studies:   The following studies were reviewed today:  R/L Cardiac catheterization 05/10/2019 LM normal  LAD prox 15, mid 40; D2 mild dz OM2 30, OM3 20 RCA prox 100 (CTO); L-R collats Mean RA 17, mean PA 25, PCWP 24; CO 7.7; CI 3.4  Echocardiogram 03/11/2019 EF 20-25, mild LVH, Gr 3 DD, inf HK, mild RAE  Echocardiogram 09/27/18 Mild LVH, EF 20-25, diff HK, Gr 2 DD, trivial AI, mild MR, severe LAE, mild reduced RVSF, mod RAE, trivial eff    Labs/Other Tests and Data Reviewed:    EKG:  No ECG reviewed.  Recent Labs: 05/07/2019: ALT 36; B Natriuretic Peptide 3,955.9; TSH 1.237 05/08/2019: Magnesium 2.1 05/10/2019: Hemoglobin 11.9; Platelets 312 05/11/2019: BUN 32; Creatinine, Ser 1.14; Potassium 3.9; Sodium 137   Recent Lipid Panel Lab Results  Component Value Date/Time   CHOL 161 09/27/2018 07:00 AM   TRIG 25 09/27/2018 07:00 AM   HDL 55 09/27/2018 07:00 AM   CHOLHDL 2.9 09/27/2018 07:00 AM   LDLCALC 101 (H) 09/27/2018 07:00 AM    Wt Readings from Last 3 Encounters:  05/26/19 238 lb (108 kg)  05/11/19 242 lb 9.6 oz (110 kg)  04/28/19 230 lb (104.3 kg)     Objective:    Vital Signs:  BP 137/85   Pulse 80   Ht 5\' 8"  (1.727 m)   Wt 238 lb (108 kg)   BMI 36.19 kg/m    VITAL SIGNS:  reviewed GEN:  no acute distress RESPIRATORY:  no labored breathing NEURO:  alert and oriented PSYCH:  normal  mood  ASSESSMENT & PLAN:    1. Chronic combined systolic and diastolic heart failure (HCC) EF 20-25 by Echocardiogram in 03/2019.  NYHA 2.  His volume status seems stable.  He is on a good medical regimen which includes a non-selective beta-blocker, hydralazine, nitrates and spironolactone. It looks like Losartan was DC"d in the hospital due to worsening creatinine.  We could consider resuming that at some point vs starting Entresto.  I will arrange a follow up BMET next week.  I can see him again in 3-4 weeks to further titrate his medications.  We discussed our food assistance program and he would like some help.  I will refer him to our Child psychotherapistsocial worker.  We  also discussed the importance of daily weights and when to take extra Torsemide.    2. Dilated cardiomyopathy (HCC) Mixed ischemic and non-ischemic CM.  Continue to titrate meds for CHF.  He will need a follow up echocardiogram at some point in the next 3 months.  If his EF remains < 35, refer to EP for ICD.   3. Coronary artery disease involving native coronary artery of native heart without angina pectoris CTO of the RCA noted on recent cardiac catheterization.  There was non-obs disease elsewhere.  Continue ASA, Plavix, beta-blocker, statin.    4. Essential hypertension Fair control.  He notes BPs 115-120 at home as well.  Continue to monitor.    5. Chronic kidney disease, stage II (mild) Creatinine improved prior to DC.  Repeat BMET next week.   6. Substance abuse (HCC) He admits to cocaine, THC and alcohol.  I will see if our social worker can provide him with assistance into getting into a treatment program.  We discussed the dangers of continued cocaine use.  Luckily, he is on a non-selective beta-blocker.   7. Hyperlipidemia, unspecified hyperlipidemia type Continue statin.  Arrange fasting Lipids and LFTs.    8. Gastroesophageal reflux disease, esophagitis presence not specified He notes significant symptoms of acid reflux and  vomiting after eating.  I will put him on Pepcid 20 mg twice daily.  I have asked him to follow up with primary care for further evaluation and management.   COVID-19 Education: The signs and symptoms of COVID-19 were discussed with the patient and how to seek care for testing (follow up with PCP or arrange E-visit).  The importance of social distancing was discussed today.  Time:   Today, I have spent 20 minutes with the patient with telehealth technology discussing the above problems.     Medication Adjustments/Labs and Tests Ordered: Current medicines are reviewed at length with the patient today.  Concerns regarding medicines are outlined above.   Tests Ordered: Orders Placed This Encounter  Procedures  . Basic metabolic panel  . Hepatic function panel  . Lipid panel    Medication Changes: Meds ordered this encounter  Medications  . famotidine (PEPCID) 20 MG tablet    Sig: Take 1 tablet (20 mg total) by mouth 2 (two) times daily.    Dispense:  30 tablet    Refill:  6    Follow Up:  In Person in 4 week(s)  Signed, Tereso Newcomer, PA-C  05/26/2019 12:28 PM    [facility]

## 2019-05-25 NOTE — Telephone Encounter (Signed)

## 2019-05-26 ENCOUNTER — Telehealth (INDEPENDENT_AMBULATORY_CARE_PROVIDER_SITE_OTHER): Payer: Medicaid Other | Admitting: Physician Assistant

## 2019-05-26 ENCOUNTER — Telehealth: Payer: Self-pay | Admitting: Physician Assistant

## 2019-05-26 ENCOUNTER — Other Ambulatory Visit: Payer: Self-pay

## 2019-05-26 VITALS — BP 137/85 | HR 80 | Ht 68.0 in | Wt 238.0 lb

## 2019-05-26 DIAGNOSIS — I420 Dilated cardiomyopathy: Secondary | ICD-10-CM

## 2019-05-26 DIAGNOSIS — I2510 Atherosclerotic heart disease of native coronary artery without angina pectoris: Secondary | ICD-10-CM

## 2019-05-26 DIAGNOSIS — I10 Essential (primary) hypertension: Secondary | ICD-10-CM

## 2019-05-26 DIAGNOSIS — K219 Gastro-esophageal reflux disease without esophagitis: Secondary | ICD-10-CM

## 2019-05-26 DIAGNOSIS — E785 Hyperlipidemia, unspecified: Secondary | ICD-10-CM

## 2019-05-26 DIAGNOSIS — I5042 Chronic combined systolic (congestive) and diastolic (congestive) heart failure: Secondary | ICD-10-CM | POA: Diagnosis not present

## 2019-05-26 DIAGNOSIS — F1910 Other psychoactive substance abuse, uncomplicated: Secondary | ICD-10-CM

## 2019-05-26 DIAGNOSIS — N182 Chronic kidney disease, stage 2 (mild): Secondary | ICD-10-CM

## 2019-05-26 MED ORDER — FAMOTIDINE 20 MG PO TABS: 20.0000 mg | ORAL_TABLET | Freq: Two times a day (BID) | ORAL | 6 refills | Status: DC

## 2019-05-26 MED ORDER — PANTOPRAZOLE SODIUM 40 MG PO TBEC: 40.0000 mg | DELAYED_RELEASE_TABLET | Freq: Every day | ORAL | 2 refills | Status: DC

## 2019-05-26 NOTE — Patient Instructions (Signed)
Medication Instructions:  . If you need a refill on your cardiac medications before your next appointment, please call your pharmacy.   Lab work: RETURN IN A WEEK  FOR FASTING LABS BMET LFT AND LIPIDS   If you have labs (blood work) drawn today and your tests are completely normal, you will receive your results only by: Marland Kitchen MyChart Message (if you have MyChart) OR . A paper copy in the mail If you have any lab test that is abnormal or we need to change your treatment, we will call you to review the results.  Testing/Procedures: NONE ORDERED  TODAY    Follow-Up: At [facility], you and your health needs are our priority.  As part of our continuing mission to provide you with exceptional heart care, we have created designated Provider Care Teams.  These Care Teams include your primary Cardiologist (physician) and Advanced Practice Providers (APPs -  Physician Assistants and Nurse Practitioners) who all work together to provide you with the care you need, when you need it.  You will need a follow up appointment in:  3-4  weeks.  Please call our office 2 months in advance to schedule this appointment.  Richardson Dopp, PA-C  Any Other Special Instructions Will Be Listed Below (If Applicable).

## 2019-05-26 NOTE — Telephone Encounter (Signed)
Please change to Protonix 40 mg once daily, #30, 2 refills Richardson Dopp, PA-C    05/26/2019 2:00 PM

## 2019-05-26 NOTE — Telephone Encounter (Signed)
[place] stating that pt medication Famotidine 20 mg tablet is on backorder and wanted Richardson Dopp, PA to send in an alternative for the pt to be able to get his medication. Please address

## 2019-05-26 NOTE — Addendum Note (Signed)
Addended by: Claude Manges on: 05/26/2019 03:24 PM   Modules accepted: Orders

## 2019-05-31 ENCOUNTER — Encounter: Payer: Self-pay | Admitting: Licensed Clinical Social Worker

## 2019-05-31 ENCOUNTER — Telehealth: Payer: Self-pay | Admitting: Licensed Clinical Social Worker
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
## 2019-06-01 ENCOUNTER — Telehealth: Payer: Self-pay | Admitting: Licensed Clinical Social Worker

## 2019-06-01 NOTE — Telephone Encounter (Signed)
CSW attempted to contact patient to follow up on additional resources and referrals. Message left for return call. Raquel Sarna, [place], [place]

## 2019-06-02 ENCOUNTER — Telehealth: Payer: Self-pay | Admitting: Licensed Clinical Social Worker

## 2019-06-02 ENCOUNTER — Other Ambulatory Visit: Payer: Medicaid Other

## 2019-06-02 NOTE — Telephone Encounter (Signed)
CSW left message for return call to inquire about follow up on food resources. Raquel Sarna, [place], [place]

## 2019-06-17 ENCOUNTER — Telehealth: Payer: Self-pay | Admitting: *Deleted

## 2019-06-17 NOTE — Telephone Encounter (Signed)
-----   Message from Liliane Shi, [place] sent at 06/17/2019 10:15 AM EDT ----- Regarding: Mon 8/17 appt He was supposed to have labs 1 week after my visit with him last month.  They never got done.  I'm not sure if we dropped the ball or if he just did not come in.  I think he should come in for an in person visit. I have a couple of Fridays that Gerald Stabs has scheduled me to be the APPOD - so I am in person those days (just realized that today). See if he can come in on Fri 8/28 at 8:15?  He should come in fasting and we can get CMET, Lipids at that time.  I tried to add him at that time but I could not override it (TOC slot).  I give permission to put him there - if anyone tells you that you cannot add him, they need to call me on my cell phone today. Thanks [employer]

## 2019-06-17 NOTE — Telephone Encounter (Signed)
lvm pt's [place] visit got moved to a in office visit on 8/28 @ 8:15 per [place]. Pt is to come fasting. If pt cannot keep appt call office to R/S.

## 2019-06-21 ENCOUNTER — Telehealth: Payer: Self-pay | Admitting: Cardiovascular Disease

## 2019-06-21 ENCOUNTER — Telehealth: Payer: Medicaid Other | Admitting: Physician Assistant

## 2019-06-21 NOTE — Telephone Encounter (Signed)
 *  STAT* If patient is at the pharmacy, call can be transferred to refill team.   1. Which medications need to be refilled? (please list name of each medication and dose if known) all medications  2. Which pharmacy/location (including street and city if local pharmacy) is medication to be sent to? [facility] [address]  3. Do they need a 30 day or 90 day supply? [place]

## 2019-06-21 NOTE — Telephone Encounter (Signed)
Called pt and left message asking pt to call back to clarify which medications that pt is referring to be refilled.

## 2019-06-24 ENCOUNTER — Emergency Department (HOSPITAL_COMMUNITY)
Admission: EM | Admit: 2019-06-24 | Discharge: 2019-06-24 | Disposition: A | Discharge status: Home / Self Care | Payer: Medicaid Other | Attending: Emergency Medicine | Admitting: Emergency Medicine

## 2019-06-24 ENCOUNTER — Emergency Department (HOSPITAL_COMMUNITY): Payer: Medicaid Other

## 2019-06-24 ENCOUNTER — Other Ambulatory Visit: Payer: Self-pay

## 2019-06-24 DIAGNOSIS — Z7982 Long term (current) use of aspirin: Secondary | ICD-10-CM | POA: Diagnosis not present

## 2019-06-24 DIAGNOSIS — N182 Chronic kidney disease, stage 2 (mild): Secondary | ICD-10-CM | POA: Insufficient documentation

## 2019-06-24 DIAGNOSIS — R0602 Shortness of breath: Secondary | ICD-10-CM | POA: Diagnosis present

## 2019-06-24 DIAGNOSIS — Z7902 Long term (current) use of antithrombotics/antiplatelets: Secondary | ICD-10-CM | POA: Diagnosis not present

## 2019-06-24 DIAGNOSIS — I509 Heart failure, unspecified: Secondary | ICD-10-CM

## 2019-06-24 DIAGNOSIS — Z79899 Other long term (current) drug therapy: Secondary | ICD-10-CM | POA: Diagnosis not present

## 2019-06-24 DIAGNOSIS — I130 Hypertensive heart and chronic kidney disease with heart failure and stage 1 through stage 4 chronic kidney disease, or unspecified chronic kidney disease: Secondary | ICD-10-CM | POA: Diagnosis not present

## 2019-06-24 DIAGNOSIS — I5043 Acute on chronic combined systolic (congestive) and diastolic (congestive) heart failure: Secondary | ICD-10-CM | POA: Insufficient documentation

## 2019-06-24 LAB — COMPREHENSIVE METABOLIC PANEL
ALT: 33 U/L (ref 0–44)
AST: 34 U/L (ref 15–41)
Albumin: 3.8 g/dL (ref 3.5–5.0)
Alkaline Phosphatase: 93 U/L (ref 38–126)
Anion gap: 13 (ref 5–15)
BUN: 22 mg/dL — ABNORMAL HIGH (ref 6–20)
CO2: 22 mmol/L (ref 22–32)
Calcium: 8.6 mg/dL — ABNORMAL LOW (ref 8.9–10.3)
Chloride: 100 mmol/L (ref 98–111)
Creatinine, Ser: 1.24 mg/dL (ref 0.61–1.24)
GFR calc Af Amer: >60 mL/min (ref >60)
GFR calc non Af Amer: >60 mL/min (ref >60)
Glucose, Bld: 100 mg/dL — ABNORMAL HIGH (ref 70–99)
Potassium: 3.4 mmol/L — ABNORMAL LOW (ref 3.5–5.1)
Sodium: 135 mmol/L (ref 135–145)
Total Bilirubin: 2.5 mg/dL — ABNORMAL HIGH (ref 0.3–1.2)
Total Protein: 7.3 g/dL (ref 6.5–8.1)

## 2019-06-24 LAB — CBC WITH DIFFERENTIAL/PLATELET
Abs Immature Granulocytes: 0.02 10*3/uL (ref 0.00–0.07)
Basophils Absolute: 0 10*3/uL (ref 0.0–0.1)
Basophils Relative: 0 %
Eosinophils Absolute: 0.1 10*3/uL (ref 0.0–0.5)
Eosinophils Relative: 1 %
HCT: 36 % — ABNORMAL LOW (ref 39.0–52.0)
Hemoglobin: 11.9 g/dL — ABNORMAL LOW (ref 13.0–17.0)
Immature Granulocytes: 0 %
Lymphocytes Relative: 18 %
Lymphs Abs: 1.4 10*3/uL (ref 0.7–4.0)
MCH: 27.6 pg (ref 26.0–34.0)
MCHC: 33.1 g/dL (ref 30.0–36.0)
MCV: 83.5 fL (ref 80.0–100.0)
Monocytes Absolute: 0.8 10*3/uL (ref 0.1–1.0)
Monocytes Relative: 11 %
Neutro Abs: 5.3 10*3/uL (ref 1.7–7.7)
Neutrophils Relative %: 70 %
Platelets: 266 10*3/uL (ref 150–400)
RBC: 4.31 MIL/uL (ref 4.22–5.81)
RDW: 18.8 % — ABNORMAL HIGH (ref 11.5–15.5)
WBC: 7.6 10*3/uL (ref 4.0–10.5)
nRBC: 0 % (ref 0.0–0.2)

## 2019-06-24 LAB — BRAIN NATRIURETIC PEPTIDE: B Natriuretic Peptide: 4131.1 pg/mL — ABNORMAL HIGH (ref 0.0–100.0)

## 2019-06-24 MED ORDER — SPIRONOLACTONE 25 MG PO TABS: 25.0000 mg | ORAL_TABLET | Freq: Every day | ORAL | 0 refills | Status: DC

## 2019-06-24 MED ORDER — PRAZOSIN HCL 1 MG PO CAPS: 1.0000 mg | ORAL_CAPSULE | Freq: Every day | ORAL | 0 refills | Status: DC

## 2019-06-24 MED ORDER — ATORVASTATIN CALCIUM 80 MG PO TABS: 80.0000 mg | ORAL_TABLET | Freq: Every day | ORAL | 0 refills | Status: DC

## 2019-06-24 MED ORDER — FUROSEMIDE 40 MG PO TABS
40.0000 mg | ORAL_TABLET | Freq: Once | ORAL | Status: AC
  Administered 2019-06-24: 11:00:00 40 mg via ORAL
  Filled 2019-06-24: qty 1

## 2019-06-24 MED ORDER — TORSEMIDE 20 MG PO TABS: 20.0000 mg | ORAL_TABLET | Freq: Every day | ORAL | 0 refills | Status: DC

## 2019-06-24 MED ORDER — CARVEDILOL 12.5 MG PO TABS: 12.5000 mg | ORAL_TABLET | Freq: Two times a day (BID) | ORAL | 0 refills | Status: DC

## 2019-06-24 MED ORDER — ASPIRIN 81 MG PO TBEC: 81.0000 mg | DELAYED_RELEASE_TABLET | Freq: Every day | ORAL | 0 refills | Status: DC

## 2019-06-24 MED ORDER — CLOPIDOGREL BISULFATE 75 MG PO TABS: 75.0000 mg | ORAL_TABLET | Freq: Every day | ORAL | 0 refills | Status: DC

## 2019-06-24 MED ORDER — ISOSORB DINITRATE-HYDRALAZINE 20-37.5 MG PO TABS: 1.0000 | ORAL_TABLET | Freq: Three times a day (TID) | ORAL | 0 refills | Status: DC

## 2019-06-24 NOTE — ED Notes (Signed)
Pt verbalized understanding of d/c instructions. VSS pt is Hypertensive MD aware and denies chest pains. Belongings returned. Pt called taxi. Pt wheeled from ED based on preference. Pt is ambulatory.

## 2019-06-24 NOTE — ED Notes (Signed)
This NT went into patient room to update vitals patient started yelling saying that he has been here since lastnite and no one has done anything for him. Then patient stated to writer you can get out and don't come back in here. RN notified

## 2019-06-24 NOTE — ED Provider Notes (Signed)
[facility]-EMERGENCY DEPT Provider Note   CSN: [id number] Arrival date & time: 06/24/19  0211     History   Chief Complaint Chief Complaint  Patient presents with  . Shortness of Breath    HPI Dale [name] is a 58 y.o. male.     HPI Patient presents to the emergency department with complaints of retaining fluid after not having his diuretics and running out of his medications.  The patient states he is also had some shortness of breath.  Patient states that nothing seems to make his condition better but activity makes his shortness of breath worse.  The patient states that he needs refills on his medications.  The patient denies chest pain, headache,blurred vision, neck pain, fever, cough, weakness, numbness, dizziness, anorexia,  abdominal pain, nausea, vomiting, diarrhea, rash, back pain, dysuria, hematemesis, bloody stool, near syncope, or syncope. Past Medical History:  Diagnosis Date  . Alcohol abuse   . Chronic combined systolic and diastolic CHF (congestive heart failure) (HCC)    a. 09/27/18 showed mild LVH, EF 20-25%, grade 2 DD, mild MR, severely dilated LA, mildly dilated RV with mildly reduced RV function, mod RAE.  Marland Kitchen Cocaine abuse (HCC)   . Hypertension     Patient Active Problem List   Diagnosis Date Noted  . Acute on chronic combined systolic (congestive) and diastolic (congestive) heart failure (HCC)   . Acute CHF (congestive heart failure) (HCC) 05/07/2019  . Nausea & vomiting 05/07/2019  . Acute exacerbation of CHF (congestive heart failure) (HCC) 03/11/2019  . Troponin level elevated 03/11/2019  . Hyperlipidemia 03/03/2019  . NSVT (nonsustained ventricular tachycardia) (HCC) 03/03/2019  . Anxiety and depression   . NSTEMI (non-ST elevated myocardial infarction) (HCC) 02/28/2019  . Acute systolic heart failure (HCC) 11/08/2018  . Hypertensive urgency 09/28/2018  . Acute pulmonary edema (HCC) 09/28/2018  . Cocaine abuse (HCC)  09/28/2018  . Substance abuse (HCC) 09/28/2018  . Chronic kidney disease, stage II (mild) 09/28/2018  . Normochromic anemia 09/28/2018  . Acute systolic CHF (congestive heart failure) (HCC) 09/27/2018  . Essential hypertension 03/11/2016  . Obesity 03/11/2016  . Glucosuria 03/11/2016    Past Surgical History:  Procedure Laterality Date  . RIGHT/LEFT HEART CATH AND CORONARY ANGIOGRAPHY N/A 05/10/2019   Procedure: RIGHT/LEFT HEART CATH AND CORONARY ANGIOGRAPHY;  Surgeon: Yvonne Kendall, MD;  Location: [facility];  Service: Cardiovascular;  Laterality: N/A;        Home Medications    Prior to Admission medications   Medication Sig Start Date End Date Taking? Authorizing Provider  aspirin 81 MG EC tablet Take 1 tablet (81 mg total) by mouth daily. 05/11/19  Yes Hall, Carole N, DO  atorvastatin (LIPITOR) 80 MG tablet Take 1 tablet (80 mg total) by mouth daily. 05/11/19 09/08/19 Yes Darlin Drop, DO  carvedilol (COREG) 12.5 MG tablet Take 1 tablet (12.5 mg total) by mouth 2 (two) times daily with a meal. 05/11/19  Yes Darlin Drop, DO  clopidogrel (PLAVIX) 75 MG tablet Take 1 tablet (75 mg total) by mouth daily. 05/11/19  Yes Dow Adolph N, DO  diclofenac sodium (VOLTAREN) 1 % GEL Apply 2 g topically 4 (four) times daily as needed (pain).   Yes [provider]  isosorbide-hydrALAZINE (BIDIL) 20-37.5 MG tablet Take 1 tablet by mouth 3 (three) times daily. 05/11/19  Yes Dow Adolph N, DO  prazosin (MINIPRESS) 1 MG capsule Take 1 capsule (1 mg total) by mouth daily. 05/11/19  Yes Margo Aye,  Oliver Pilaarole N, DO  spironolactone (ALDACTONE) 25 MG tablet Take 1 tablet (25 mg total) by mouth daily. 05/11/19  Yes Hall, Carole N, DO  tiZANidine (ZANAFLEX) 4 MG tablet Take 4 mg by mouth 3 (three) times daily as needed for muscle spasms.   Yes [provider]  torsemide (DEMADEX) 20 MG tablet Take 1 tablet (20 mg total) by mouth daily. 05/11/19  Yes Hall, Carole N, DO  pantoprazole (PROTONIX) 40 MG  tablet Take 1 tablet (40 mg total) by mouth daily. Patient not taking: Reported on 06/24/2019 05/26/19   Beatrice LecherWeaver, Scott T, PA-C    Family History Family History  Problem Relation Age of Onset  . Hypertension Maternal Grandmother     Social History Social History   Tobacco Use  . Smoking status: Never Smoker  . Smokeless tobacco: Never Used  Substance Use Topics  . Alcohol use: Yes    Comment: 2 quarts a week   . Drug use: Yes    Frequency: 3.0 times per week    Types: Marijuana, Cocaine     Allergies   Hydrocodone   Review of Systems Review of Systems All other systems negative except as documented in the HPI. All pertinent positives and negatives as reviewed in the HPI.  Physical Exam Updated Vital Signs BP (!) 190/136   Pulse 94   Temp 98 F (36.7 C) (Oral)   Resp 17   Ht 5\' 11"  (1.803 m)   Wt 105.2 kg   SpO2 (!) 89%   BMI 32.36 kg/m   Physical Exam Vitals signs and nursing note reviewed.  Constitutional:      General: He is not in acute distress.    Appearance: He is well-developed.  HENT:     Head: Normocephalic and atraumatic.  Eyes:     Pupils: Pupils are equal, round, and reactive to light.  Neck:     Musculoskeletal: Normal range of motion and neck supple.  Cardiovascular:     Rate and Rhythm: Normal rate and regular rhythm.     Heart sounds: Normal heart sounds. No murmur. No friction rub. No gallop.   Pulmonary:     Effort: Pulmonary effort is normal. No respiratory distress.     Breath sounds: Normal breath sounds. No wheezing.  Abdominal:     General: Bowel sounds are normal. There is no distension.     Palpations: Abdomen is soft.     Tenderness: There is no abdominal tenderness.  Musculoskeletal:     Comments: Trace edema bilaterally  Skin:    General: Skin is warm and dry.     Capillary Refill: Capillary refill takes less than 2 seconds.     Findings: No erythema or rash.  Neurological:     Mental Status: He is alert and oriented  to person, place, and time.     Motor: No abnormal muscle tone.     Coordination: Coordination normal.  Psychiatric:        Behavior: Behavior normal.      ED Treatments / Results  Labs (all labs ordered are listed, but only abnormal results are displayed) Labs Reviewed  COMPREHENSIVE METABOLIC PANEL - Abnormal; Notable for the following components:      Result Value   Potassium 3.4 (*)    Glucose, Bld 100 (*)    BUN 22 (*)    Calcium 8.6 (*)    Total Bilirubin 2.5 (*)    All other components within normal limits  CBC WITH DIFFERENTIAL/PLATELET -  Abnormal; Notable for the following components:   Hemoglobin 11.9 (*)    HCT 36.0 (*)    RDW 18.8 (*)    All other components within normal limits  BRAIN NATRIURETIC PEPTIDE - Abnormal; Notable for the following components:   B Natriuretic Peptide 4,131.1 (*)    All other components within normal limits  URINALYSIS, ROUTINE W REFLEX MICROSCOPIC    EKG None  Radiology Dg Chest 2 View  Result Date: 06/24/2019 CLINICAL DATA:  Shortness of breath. EXAM: CHEST - 2 VIEW COMPARISON:  Radiograph of May 07, 2019. FINDINGS: Stable cardiomegaly. No pneumothorax or pleural effusion is noted. No pneumothorax or pleural effusion is noted. Bony thorax is unremarkable. IMPRESSION: No active cardiopulmonary disease. Electronically Signed   By: Marijo Conception M.D.   On: 06/24/2019 08:23    Procedures Procedures (including critical care time)  Medications Ordered in ED Medications  furosemide (LASIX) tablet 40 mg (40 mg Oral Given 06/24/19 1035)     Initial Impression / Assessment and Plan / ED Course  I have reviewed the triage vital signs and the nursing notes.  Pertinent labs & imaging results that were available during my care of the patient were reviewed by me and considered in my medical decision making (see chart for details).        Patient be discharged home in stable condition.  The patient does not show any major  significant signs of heart failure at this time.  He is not have any rales on examination and his mild edema bilaterally.  Patient's BNP is fairly consistent with his previous.  I advised him to follow-up with his primary doctor along with his cardiologist.  Told return for any worsening in his condition.  Final Clinical Impressions(s) / ED Diagnoses   Final diagnoses:  None    ED Discharge Orders    None       Rebeca Allegra 06/27/19 2356    Davonna Belling, MD 07/01/19 [phone]

## 2019-06-24 NOTE — ED Triage Notes (Signed)
Hx CHF; out of meds; increased SOB x 3 days; increased swelling in legs; N/V; denies any other illness; denies any pain; refused IV and meds with EMS

## 2019-06-24 NOTE — Discharge Instructions (Addendum)
Return here as needed.  Follow-up as soon as possible with your doctor.  Your prescriptions have been sent to the pharmacy.

## 2019-07-02 ENCOUNTER — Ambulatory Visit: Payer: Medicaid Other | Admitting: Physician Assistant

## 2019-07-14 ENCOUNTER — Ambulatory Visit: Payer: Medicaid Other | Admitting: Physician Assistant

## 2019-07-14 NOTE — Progress Notes (Deleted)
Cardiology Office Note:    Date:  07/14/2019   ID:  Shyam Heglund, DOB [birth year], MRN [id number]  PCP:  Grayce Sessions, NP  Cardiologist:  Kristeen Miss, MD *** Electrophysiologist:  None   Referring MD: Grayce Sessions, NP   No chief complaint on file. ***  History of Present Illness:    Dale [name] is a 58 y.o. male with ***   Combined systolic and diastolic CHF ? Hx of multiple ED visits/Admissions for CHF in 2020 ? Mixed ischemic and non-ischemic CM ? Echocardiogram 03/2019: EF 20-25 ? Admx 05/2019  Coronary artery disease  ? Hx of NSTEMI in setting of  02/2019 >> Patient declined cardiac catheterization  ? Cardiac catheterization 05/2019: CTO of RCA with L-R collats; mild non-obs CAD elsewhere  Hypertension  Chronic kidney disease 2  Anemia  Cocaine use  ETOH abuse  Homelessness  Dale [name] was last seen via telemedicine in July 2020 for posthospitalization follow-up.  Since then, he presented to the emergency room June 24, 2019 for volume excess.  He was given a dose of Lasix and discharged home.***  Prior CV studies:   The following studies were reviewed today:  *** R/L Cardiac catheterization 05/10/2019 LM normal  LAD prox 15, mid 40; D2 mild dz OM2 30, OM3 20 RCA prox 100 (CTO); L-R collats Mean RA 17, mean PA 25, PCWP 24; CO 7.7; CI 3.4  Echocardiogram 03/11/2019 EF 20-25, mild LVH, Gr 3 DD, inf HK, mild RAE  Echocardiogram 09/27/18 Mild LVH, EF 20-25, diff HK, Gr 2 DD, trivial AI, mild MR, severe LAE, mild reduced RVSF, mod RAE, trivial eff   Past Medical History:  Diagnosis Date  . Alcohol abuse   . Chronic combined systolic and diastolic CHF (congestive heart failure) (HCC)    a. 09/27/18 showed mild LVH, EF 20-25%, grade 2 DD, mild MR, severely dilated LA, mildly dilated RV with mildly reduced RV function, mod RAE.  Marland Kitchen Cocaine abuse (HCC)   . Hypertension    Surgical Hx: The patient  has a past surgical history that includes  RIGHT/LEFT HEART CATH AND CORONARY ANGIOGRAPHY (N/A, 05/10/2019).   Current Medications: No outpatient medications have been marked as taking for the 07/14/19 encounter (Appointment) with Tereso Newcomer T, PA-C.     Allergies:   Hydrocodone   Social History   Tobacco Use  . Smoking status: Never Smoker  . Smokeless tobacco: Never Used  Substance Use Topics  . Alcohol use: Yes    Comment: 2 quarts a week   . Drug use: Yes    Frequency: 3.0 times per week    Types: Marijuana, Cocaine     Family Hx: The patient's family history includes Hypertension in his maternal grandmother.  ROS:   Please see the history of present illness.    ROS All other systems reviewed and are negative.   EKGs/Labs/Other Test Reviewed:    EKG:  EKG is *** ordered today.  The ekg ordered today demonstrates ***  Recent Labs: 05/07/2019: TSH 1.237 05/08/2019: Magnesium 2.1 06/24/2019: ALT 33; B Natriuretic Peptide 4,131.1; BUN 22; Creatinine, Ser 1.24; Hemoglobin 11.9; Platelets 266; Potassium 3.4; Sodium 135   Recent Lipid Panel Lab Results  Component Value Date/Time   CHOL 161 09/27/2018 07:00 AM   TRIG 25 09/27/2018 07:00 AM   HDL 55 09/27/2018 07:00 AM   CHOLHDL 2.9 09/27/2018 07:00 AM   LDLCALC 101 (H) 09/27/2018 07:00 AM    Physical Exam:    VS:  There were no vitals taken for this visit.    Wt Readings from Last 3 Encounters:  06/24/19 232 lb (105.2 kg)  05/26/19 238 lb (108 kg)  05/11/19 242 lb 9.6 oz (110 kg)     ***Physical Exam  ASSESSMENT & PLAN:    *** 1. Chronic combined systolic and diastolic heart failure (HCC) EF 20-25 by Echocardiogram in 03/2019.  NYHA 2.  His volume status seems stable.  He is on a good medical regimen which includes a non-selective beta-blocker, hydralazine, nitrates and spironolactone. It looks like Losartan was DC"d in the hospital due to worsening creatinine.  We could consider resuming that at some point vs starting Entresto.  I will arrange a follow up  BMET next week.  I can see him again in 3-4 weeks to further titrate his medications.  We discussed our food assistance program and he would like some help.  I will refer him to our Education officer, museum.  We also discussed the importance of daily weights and when to take extra Torsemide.    2. Dilated cardiomyopathy ([place]) Mixed ischemic and non-ischemic CM.  Continue to titrate meds for CHF.  He will need a follow up echocardiogram at some point in the next 3 months.  If his EF remains < 35, refer to EP for ICD.   3. Coronary artery disease involving native coronary artery of native heart without angina pectoris CTO of the RCA noted on recent cardiac catheterization.  There was non-obs disease elsewhere.  Continue ASA, Plavix, beta-blocker, statin.    4. Essential hypertension Fair control.  He notes BPs 115-120 at home as well.  Continue to monitor.    5. Chronic kidney disease, stage II (mild) Creatinine improved prior to DC.  Repeat BMET next week.   6. Substance abuse ([place]) He admits to cocaine, THC and alcohol.  I will see if our social worker can provide him with assistance into getting into a treatment program.  We discussed the dangers of continued cocaine use.  Luckily, he is on a non-selective beta-blocker.   7. Hyperlipidemia, unspecified hyperlipidemia type Continue statin.  Arrange fasting Lipids and LFTs.    8. Gastroesophageal reflux disease, esophagitis presence not specified He notes significant symptoms of acid reflux and vomiting after eating.  I will put him on Pepcid 20 mg twice daily.  I have asked him to follow up with primary care for further evaluation and management.    Dispo:  No follow-ups on file.   Medication Adjustments/Labs and Tests Ordered: Current medicines are reviewed at length with the patient today.  Concerns regarding medicines are outlined above.  Tests Ordered: No orders of the defined types were placed in this encounter.  Medication Changes:  No orders of the defined types were placed in this encounter.   Signed, Richardson Dopp, PA-C  07/14/2019 8:10 AM    [facility] [address], [place], [place]  [address] Phone: [phone]; Fax: [phone]

## 2019-07-23 ENCOUNTER — Other Ambulatory Visit: Payer: Self-pay | Admitting: Physician Assistant

## 2019-07-23 MED ORDER — PANTOPRAZOLE SODIUM 40 MG PO TBEC: 40.0000 mg | DELAYED_RELEASE_TABLET | Freq: Every day | ORAL | 3 refills | Status: DC

## 2019-07-23 MED ORDER — TORSEMIDE 20 MG PO TABS: 20.0000 mg | ORAL_TABLET | Freq: Every day | ORAL | 3 refills | Status: DC

## 2019-07-23 MED ORDER — ATORVASTATIN CALCIUM 80 MG PO TABS: 80.0000 mg | ORAL_TABLET | Freq: Every day | ORAL | 3 refills | Status: DC

## 2019-07-23 MED ORDER — CLOPIDOGREL BISULFATE 75 MG PO TABS: 75.0000 mg | ORAL_TABLET | Freq: Every day | ORAL | 3 refills | Status: DC

## 2019-07-23 MED ORDER — ISOSORB DINITRATE-HYDRALAZINE 20-37.5 MG PO TABS: 1.0000 | ORAL_TABLET | Freq: Three times a day (TID) | ORAL | 3 refills | Status: DC

## 2019-07-23 MED ORDER — PRAZOSIN HCL 1 MG PO CAPS: 1.0000 mg | ORAL_CAPSULE | Freq: Every day | ORAL | 3 refills | Status: DC

## 2019-07-23 MED ORDER — CARVEDILOL 12.5 MG PO TABS: 12.5000 mg | ORAL_TABLET | Freq: Two times a day (BID) | ORAL | 3 refills | Status: DC

## 2019-07-23 MED ORDER — SPIRONOLACTONE 25 MG PO TABS: 25.0000 mg | ORAL_TABLET | Freq: Every day | ORAL | 3 refills | Status: DC

## 2019-07-23 NOTE — Telephone Encounter (Signed)
Pt's medications were sent to pt's pharmacy as requested. Confirmation received.  

## 2019-08-04 ENCOUNTER — Telehealth (INDEPENDENT_AMBULATORY_CARE_PROVIDER_SITE_OTHER): Payer: Self-pay
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
## 2019-08-08 ENCOUNTER — Other Ambulatory Visit (INDEPENDENT_AMBULATORY_CARE_PROVIDER_SITE_OTHER): Payer: Self-pay | Admitting: Primary Care

## 2019-08-08 DIAGNOSIS — M79604 Pain in right leg: Secondary | ICD-10-CM

## 2019-08-08 DIAGNOSIS — M79605 Pain in left leg: Secondary | ICD-10-CM

## 2019-08-08 IMAGING — CR CHEST - 2 VIEW
2 series · 2 of 2 positions shown · non-contrast
Comparison: Chest radiograph 03/27/2019

CLINICAL DATA: Patient with shortness of breath.

EXAM:
CHEST - 2 VIEW

[w chest pa]
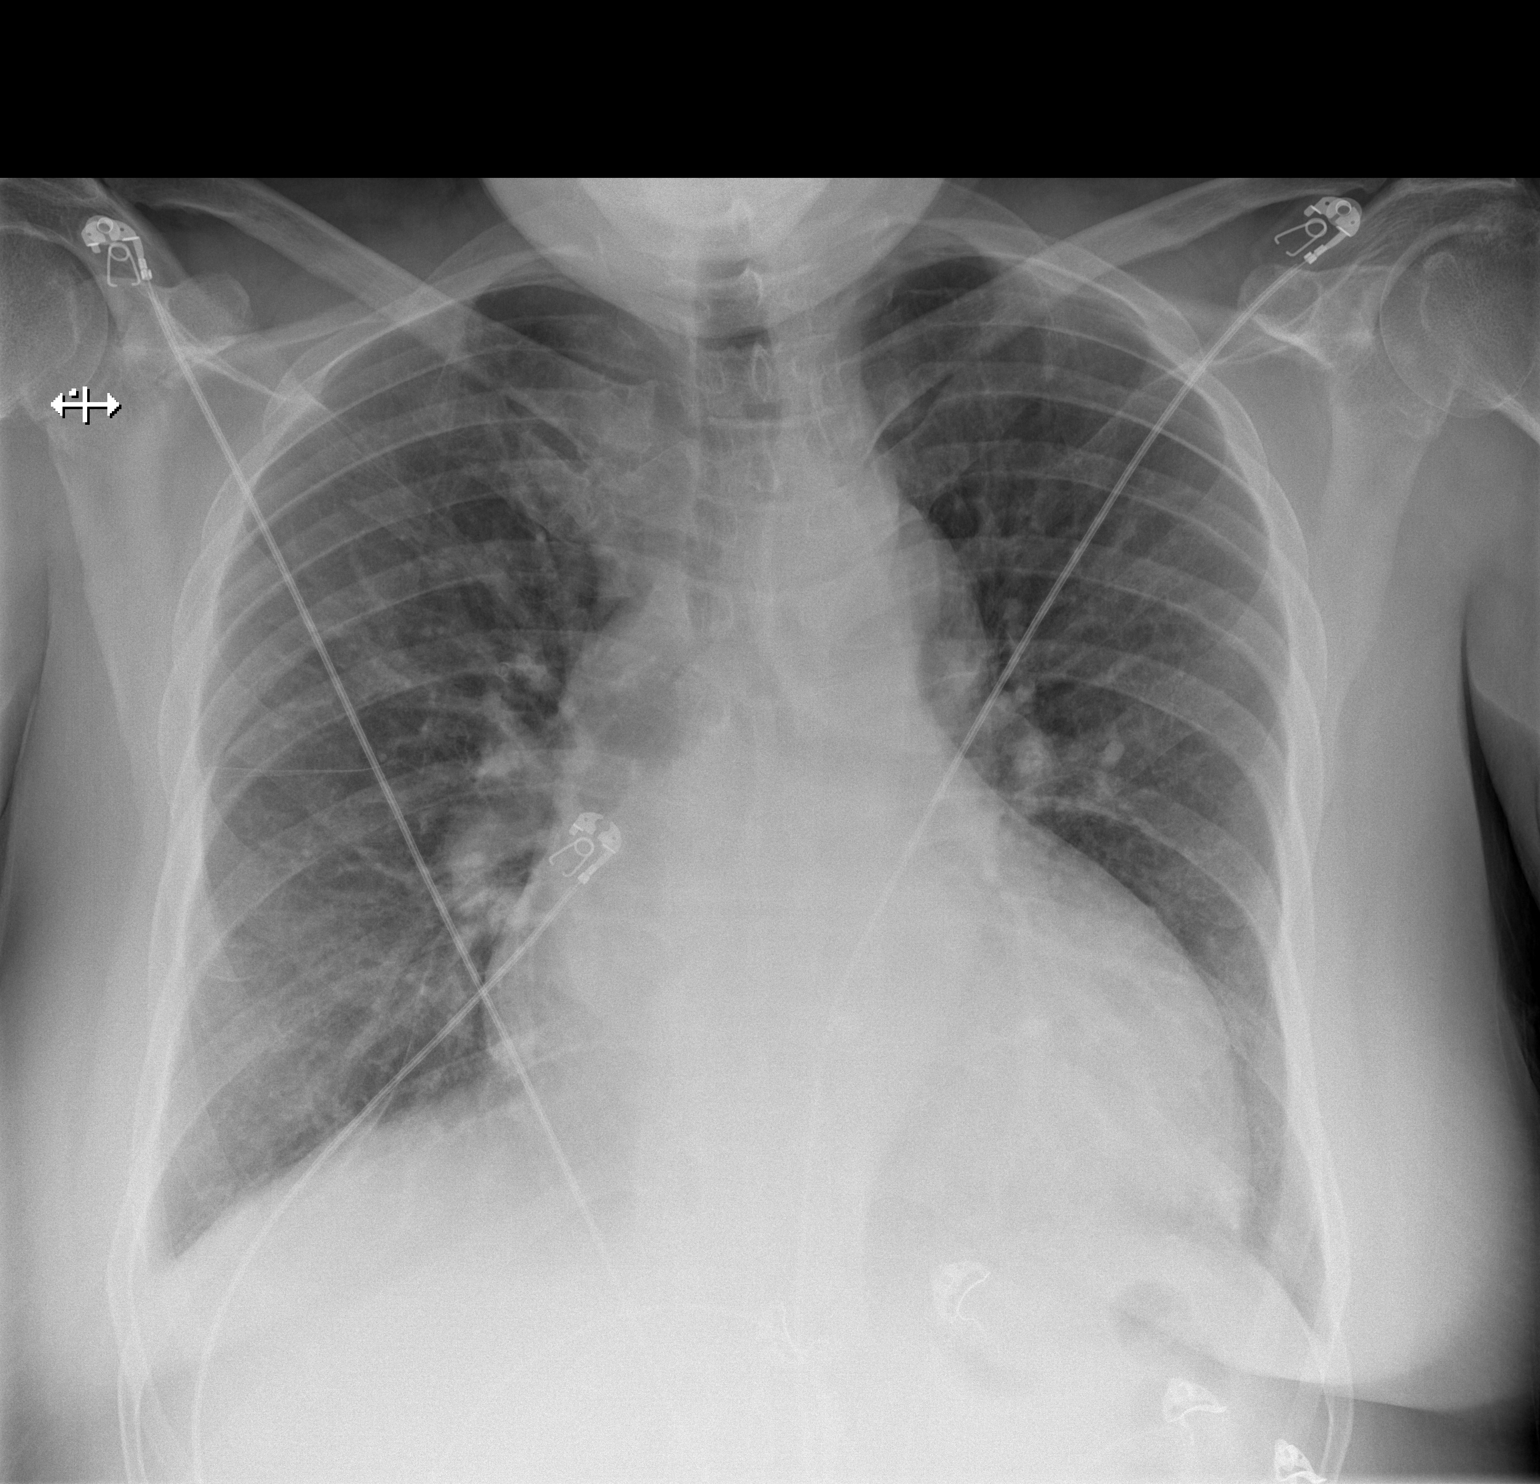

[w chest lat]
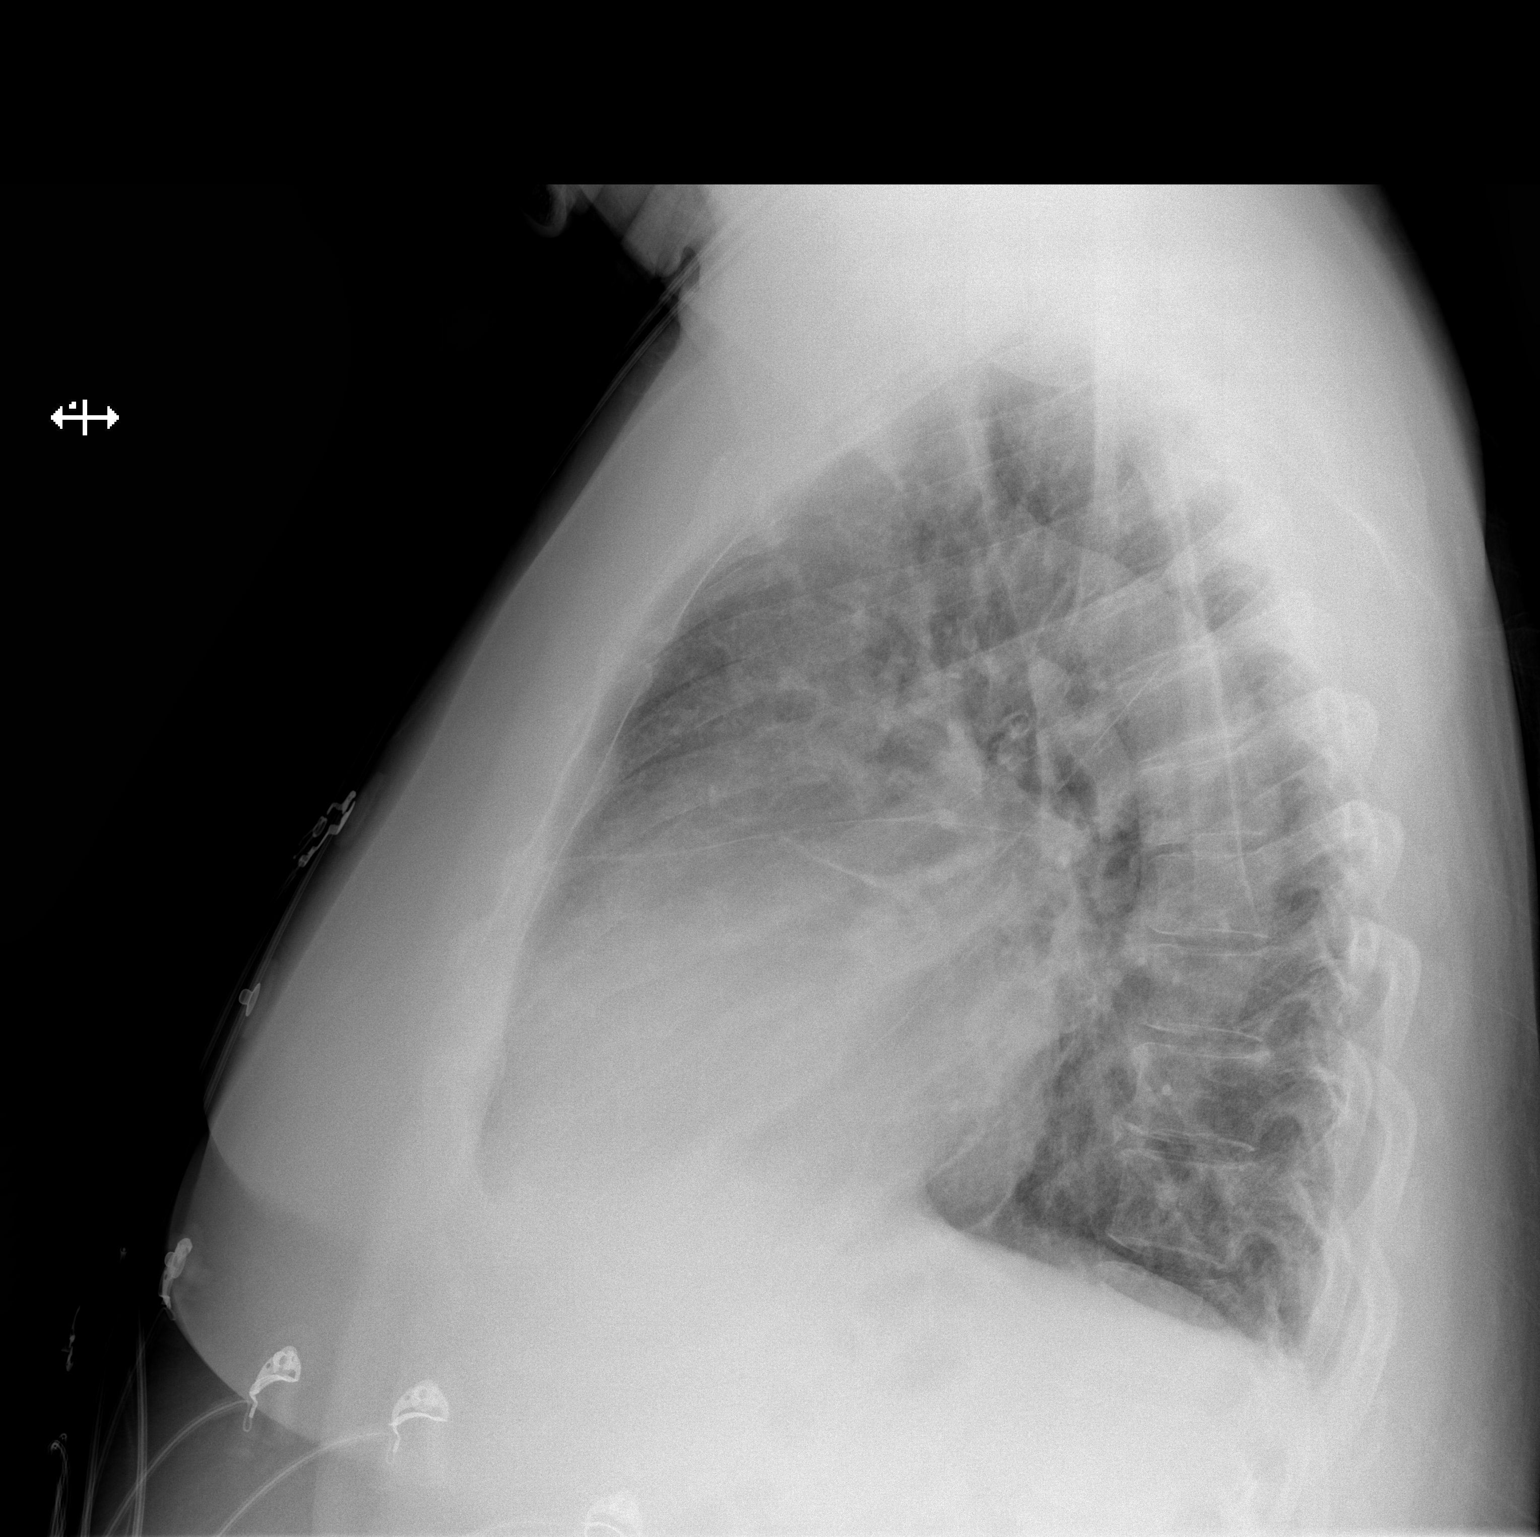

[2 of 2 positions shown; findings below may reference images not displayed]

FINDINGS: Monitoring leads overlie the patient. Cardiomegaly. Bilateral
interstitial pulmonary opacities. Small bilateral pleural effusions.
Regional skeleton is unremarkable.
IMPRESSION: Cardiomegaly.  Mild interstitial pulmonary edema.

## 2019-08-11 ENCOUNTER — Other Ambulatory Visit (INDEPENDENT_AMBULATORY_CARE_PROVIDER_SITE_OTHER): Payer: Self-pay | Admitting: Primary Care

## 2019-08-11 DIAGNOSIS — M542 Cervicalgia: Secondary | ICD-10-CM

## 2019-08-11 DIAGNOSIS — M79605 Pain in left leg: Secondary | ICD-10-CM

## 2019-08-11 DIAGNOSIS — M79604 Pain in right leg: Secondary | ICD-10-CM

## 2019-09-03 IMAGING — DX PORTABLE CHEST - 1 VIEW
1 series · 1 of 1 positions shown · non-contrast
Comparison: Chest x-ray dated April 28, 2019.

CLINICAL DATA: Shortness of breath.  Lower extremity swelling.

EXAM:
PORTABLE CHEST 1 VIEW

[chest ap]
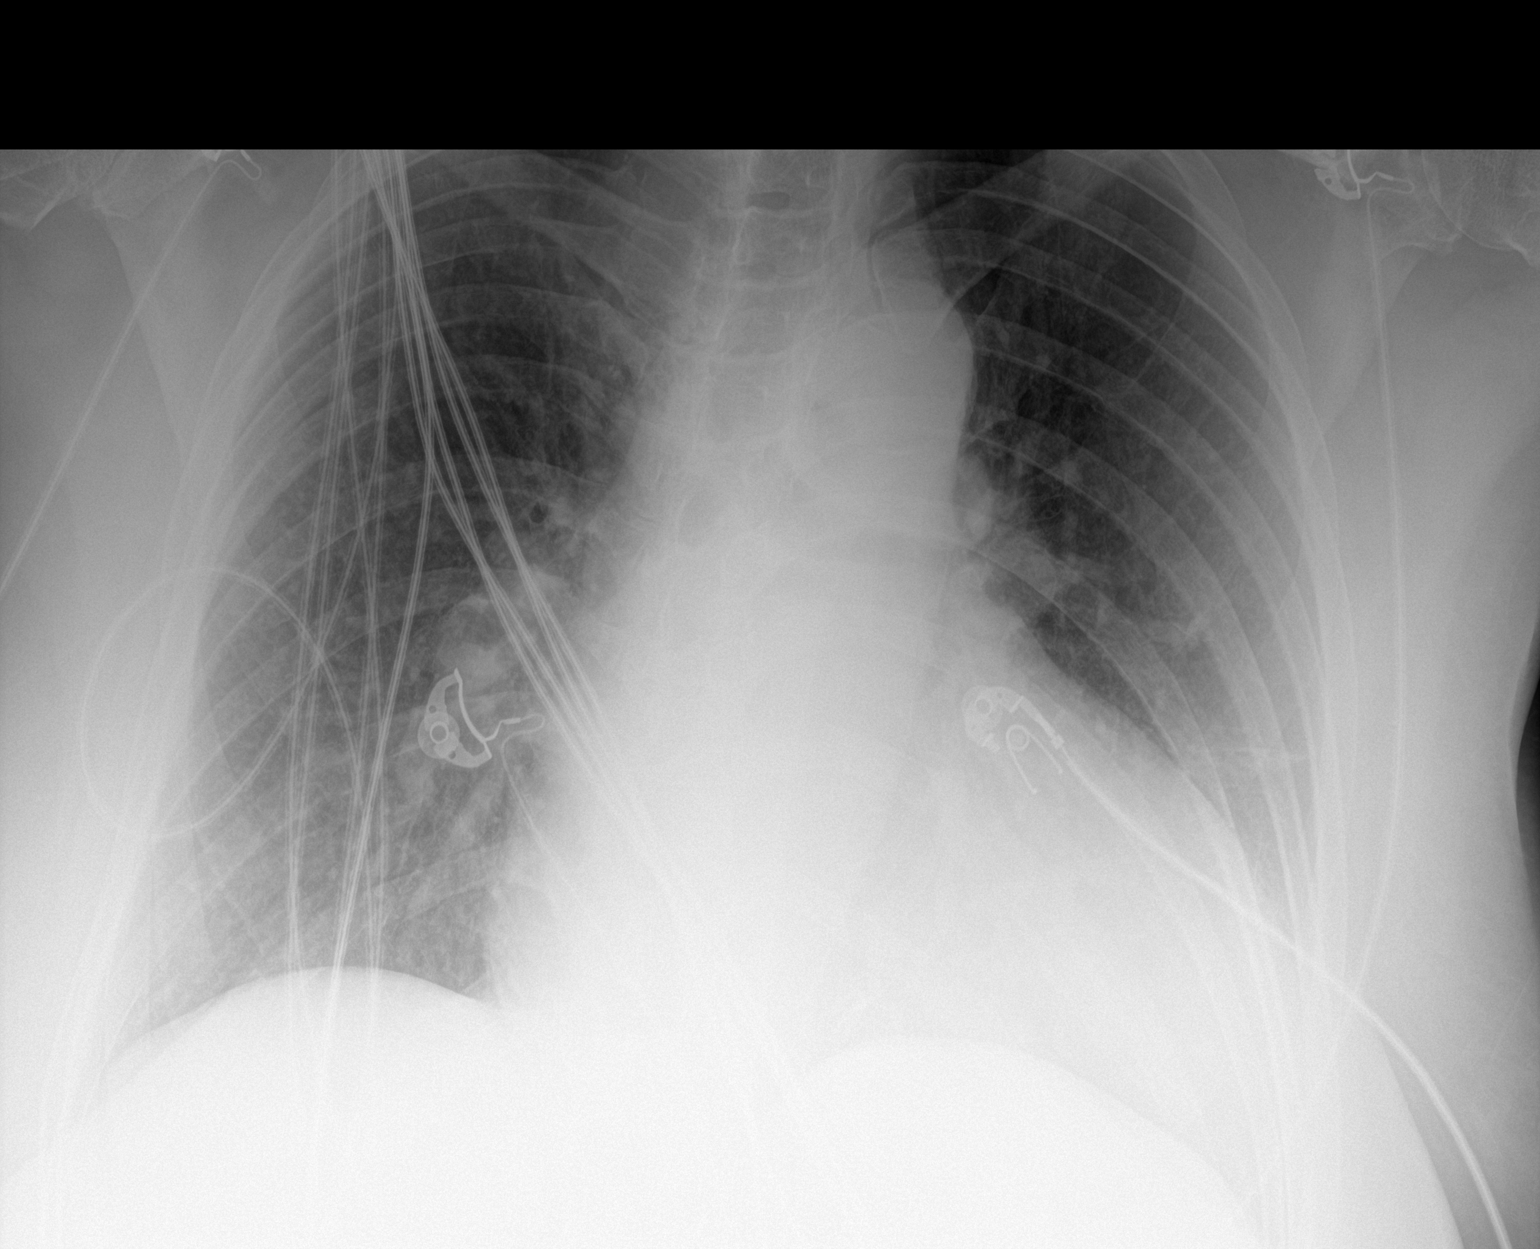

[1 of 1 positions shown; findings below may reference images not displayed]

FINDINGS: Stable cardiomegaly. Normal mediastinal contours. Unchanged mild
pulmonary vascular congestion. No focal consolidation, pleural
effusion, or pneumothorax. No acute osseous abnormality.
IMPRESSION: Unchanged mild pulmonary vascular congestion without overt edema.

## 2019-09-03 IMAGING — CT CT RENAL STONE PROTOCOL
2 of 4 series · 16 of 46 positions shown, 18 images · non-contrast
Comparison: None.

CLINICAL DATA: Nausea and vomiting.

EXAM:
CT ABDOMEN AND PELVIS WITHOUT CONTRAST
TECHNIQUE: Multidetector CT imaging of the abdomen and pelvis was performed
following the standard protocol without IV contrast.

[Series 2: axial st · axial · 0.68mm/px · z∈[+1220,+1600]mm · 13 of 86 slices shown, 15 images]
[im 5/86  soft-tissue]
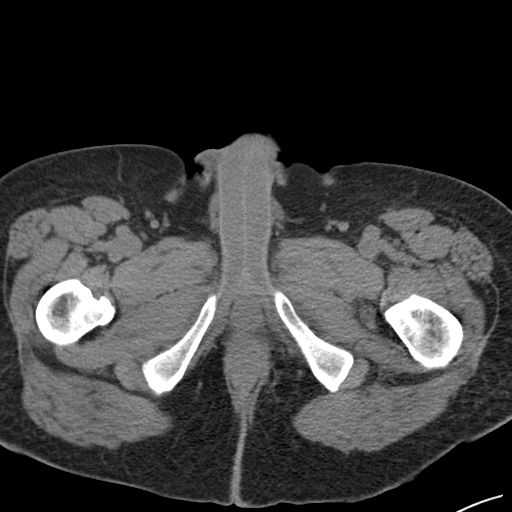
[im 5/86  bone]
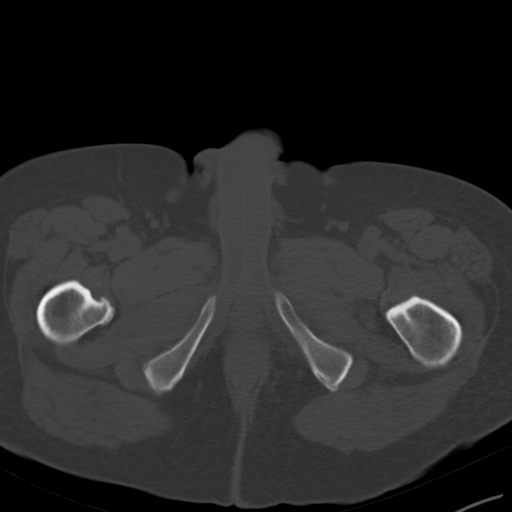
[im 13/86  soft-tissue]
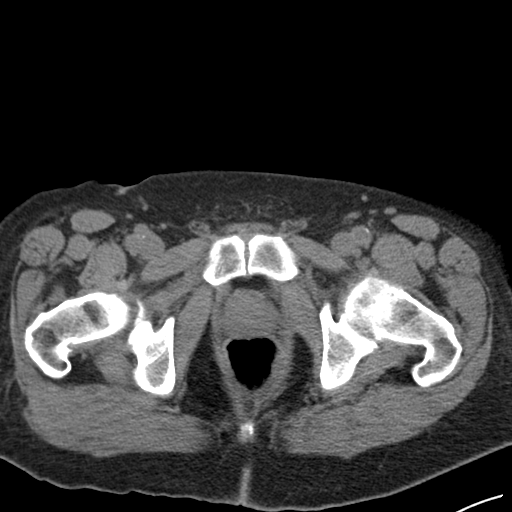
[im 18/86  soft-tissue]
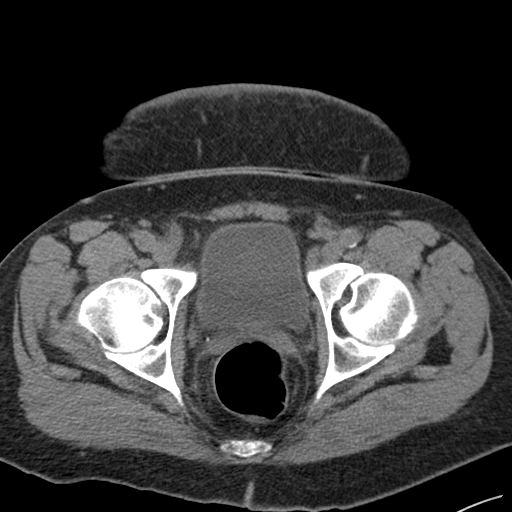
[im 26/86  soft-tissue]
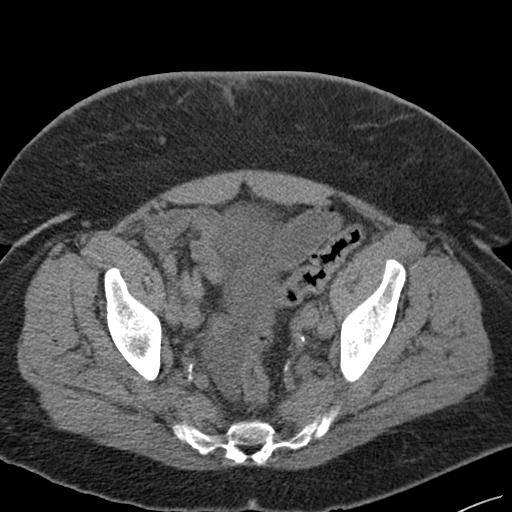
[im 30/86  soft-tissue]
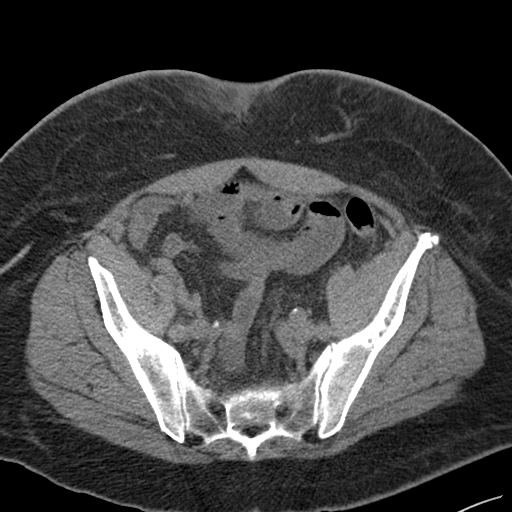
[im 39/86  soft-tissue]
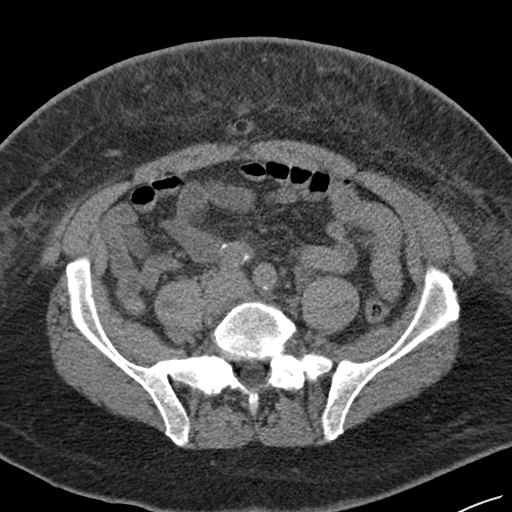
[im 43/86  soft-tissue]
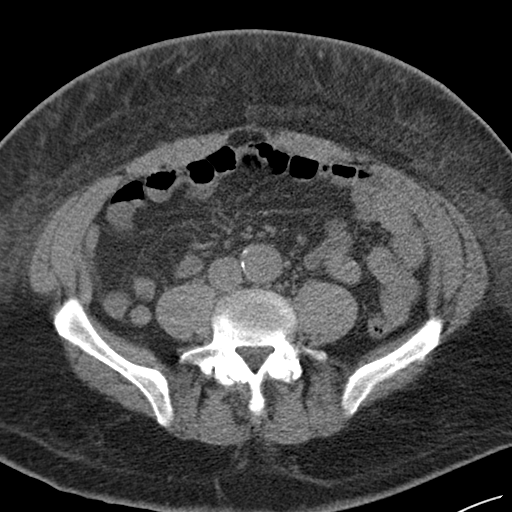
[im 47/86  soft-tissue]
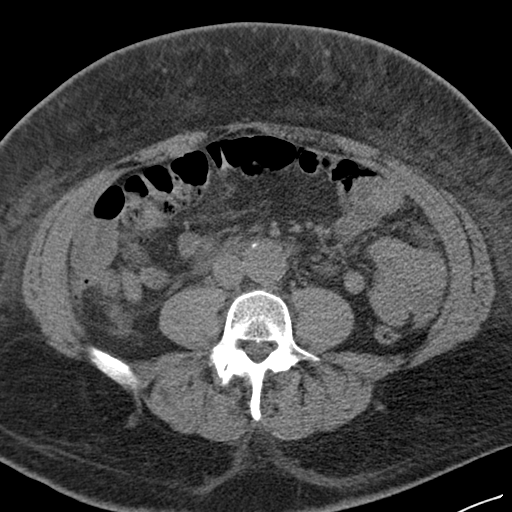
[im 56/86  soft-tissue]
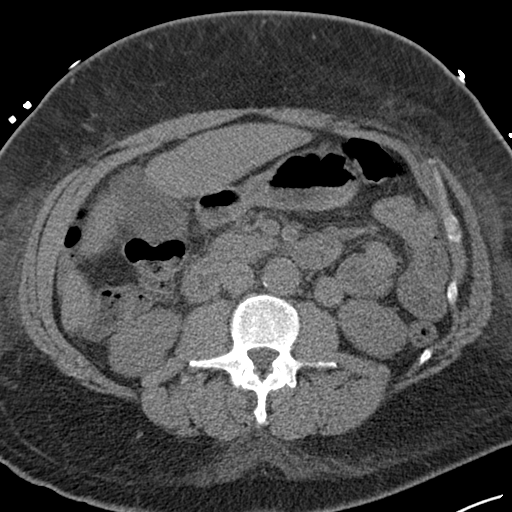
[im 56/86  bone]
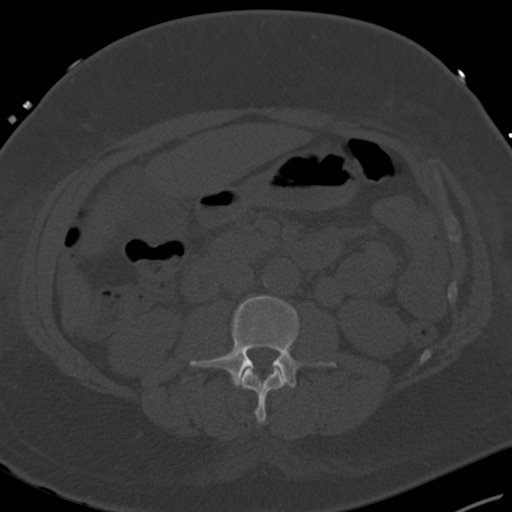
[im 60/86  soft-tissue]
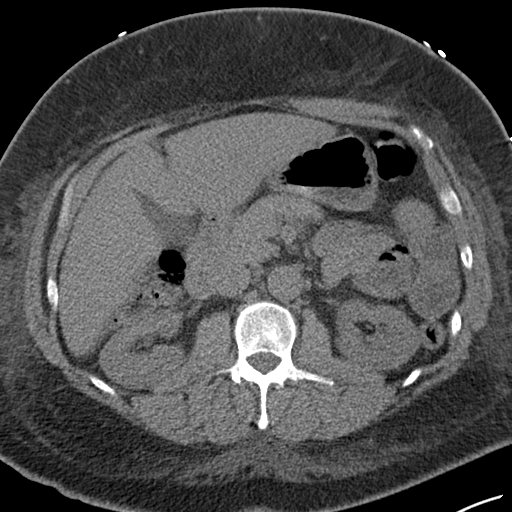
[im 69/86  soft-tissue]
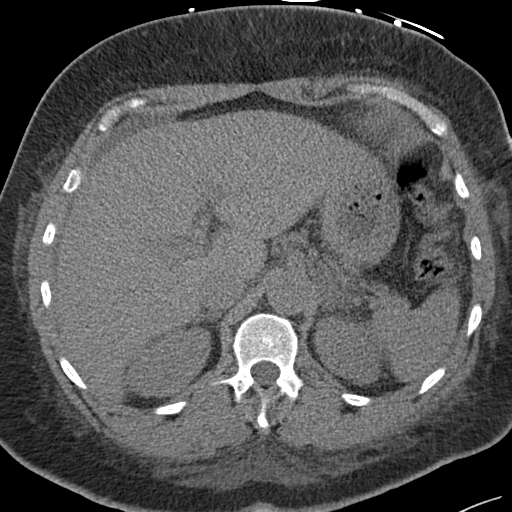
[im 73/86  soft-tissue]
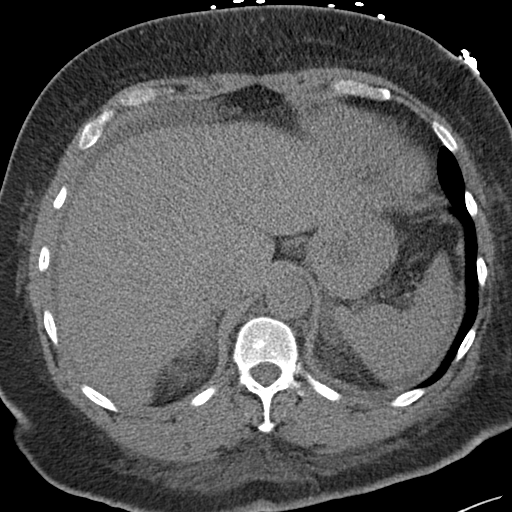
[im 81/86  soft-tissue]
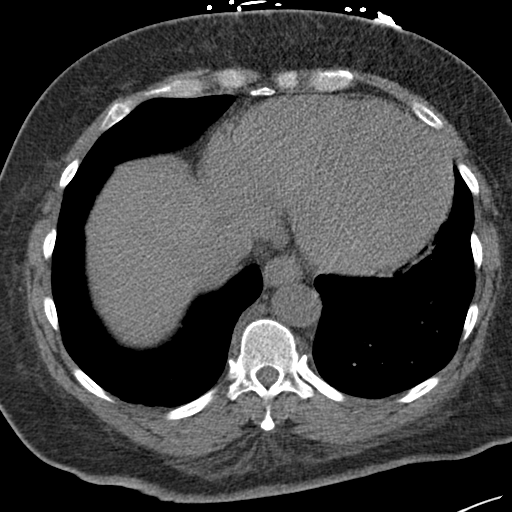

[Series 5: coronal · coronal · 0.72mm/px · 3 of 165 slices shown]
[im 55/165  soft-tissue]
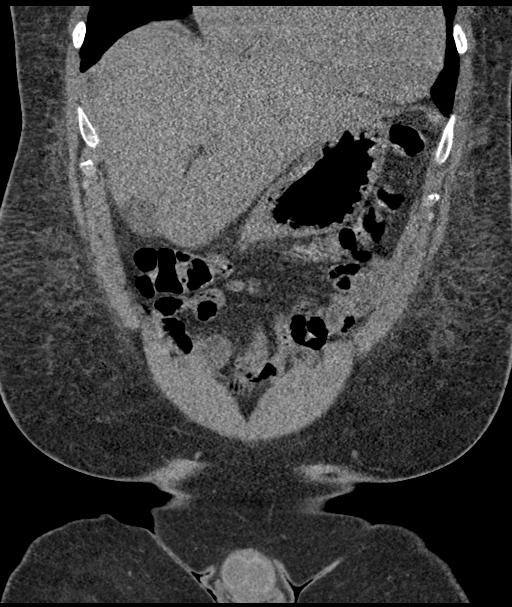
[im 73/165  soft-tissue]
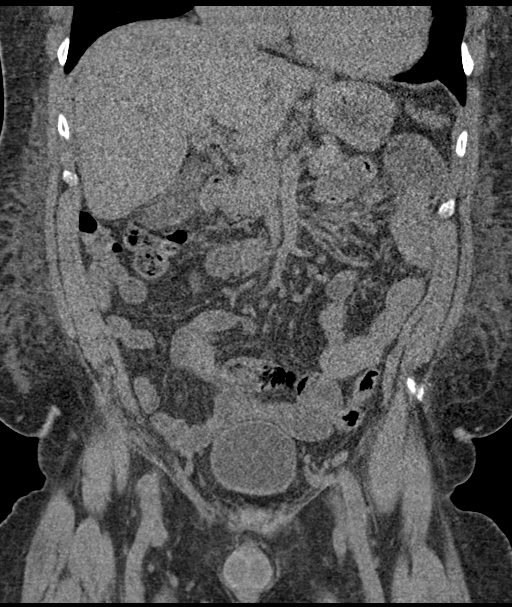
[im 92/165  soft-tissue]
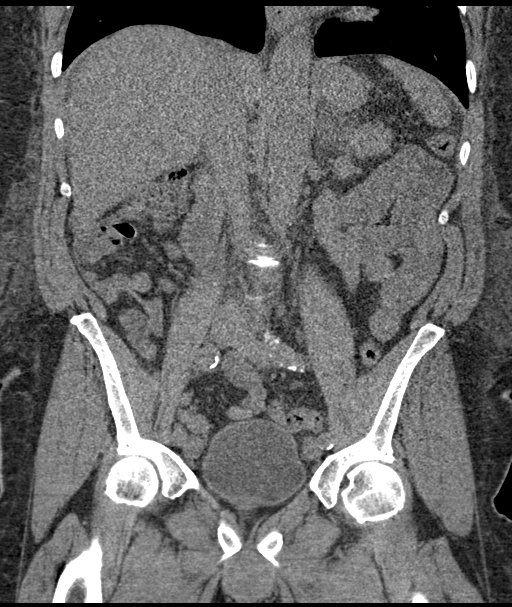

[16 of 46 positions shown; findings below may reference images not displayed]

FINDINGS: Lower chest: No acute abnormality.  Cardiomegaly.

Hepatobiliary: No focal liver abnormality is seen. No gallstones,
gallbladder wall thickening, or biliary dilatation.

Pancreas: Unremarkable. No pancreatic ductal dilatation or
surrounding inflammatory changes.

Spleen: Normal in size without focal abnormality.

Adrenals/Urinary Tract: Adrenal glands are unremarkable. Kidneys are
normal, without renal calculi, focal lesion, or hydronephrosis.
Bladder is unremarkable.

Stomach/Bowel: Stomach is within normal limits. Appendix appears
normal. No evidence of bowel wall thickening, distention, or
inflammatory changes.

Vascular/Lymphatic: Ectatic infrarenal abdominal aorta measuring up
to 2.9 cm. Aortic atherosclerosis. No enlarged abdominal or pelvic
lymph nodes.

Reproductive: Prostate is unremarkable.

Other: Trace perihepatic and pelvic ascites.  No pneumoperitoneum.

Musculoskeletal: Mild anasarca. No acute or significant osseous
findings.
IMPRESSION: 1.  No acute intra-abdominal process.
2. Trace ascites.  Mild anasarca.
3. Ectatic abdominal aorta at risk for aneurysm development.
Recommend followup by ultrasound in 5 years. This recommendation
follows ACR consensus guidelines: White Paper of the ACR Incidental
Findings Committee II on Vascular Findings. [HOSPITAL] 8598;
Aortic aneurysm NOS (6IV2I-U3Z.B)
4.  Aortic atherosclerosis (6IV2I-8QO.O).

## 2019-09-17 ENCOUNTER — Telehealth: Payer: Self-pay | Admitting: Cardiovascular Disease

## 2019-09-17 NOTE — Telephone Encounter (Signed)
Patient states that he believes he was put on disability by one of the dr's here at our practice. He needs to know the name of the dr and why he was put on disability because his lawyer is requesting this information. His lawyer is Chesley Mires.

## 2019-11-02 ENCOUNTER — Telehealth: Payer: Self-pay

## 2019-11-02 NOTE — Telephone Encounter (Signed)
Pt called insisting on me sending in a refill for his Tizanidine. I told him that we don't normally prescribe that medication but he is so sure that Dr Acie Fredrickson prescribed it. I told him that I didn't see in his chart where he had ever sent that in for him but he said I was wrong. I advised him to call his PCP but he wanted me to ask if Dr Acie Fredrickson would send in a refill.   I also told him that I didn't know when Dr Acie Fredrickson would be available to get this message.

## 2019-11-03 ENCOUNTER — Telehealth (INDEPENDENT_AMBULATORY_CARE_PROVIDER_SITE_OTHER): Payer: Self-pay | Admitting: Primary Care

## 2019-11-03 NOTE — Telephone Encounter (Signed)
1) Medication(s) Requested (by name): -tiZANidine (ZANAFLEX) 4 MG tablet   2) Pharmacy of Choice: -[facility] [place].

## 2019-11-03 NOTE — Telephone Encounter (Signed)
I called pt to let him know he would have to contact his PCP for that RX.

## 2019-11-03 NOTE — Telephone Encounter (Signed)
Tizanidine is a muscle relaxer and is not one that I prescribe. He will need to call his primary MD .

## 2019-11-03 NOTE — Telephone Encounter (Signed)
Denied muscle spasm medication needs appointment

## 2019-11-26 ENCOUNTER — Telehealth (INDEPENDENT_AMBULATORY_CARE_PROVIDER_SITE_OTHER): Payer: Self-pay

## 2019-11-26 NOTE — Telephone Encounter (Signed)
Patient called to make a medication refill for all his medications. Patient was advice to call [facility] to request refill. I was able to help patient get in contact with [facility] for his refills. Patient was advice that his medication will cost $24

## 2020-02-14 ENCOUNTER — Other Ambulatory Visit: Payer: Self-pay

## 2020-02-14 MED ORDER — ISOSORB DINITRATE-HYDRALAZINE 20-37.5 MG PO TABS: 1.0000 | ORAL_TABLET | Freq: Three times a day (TID) | ORAL | 3 refills | Status: DC

## 2020-02-14 NOTE — Telephone Encounter (Signed)
Pt's medication was sent to pt's pharmacy as requested. Confirmation received.  °

## 2020-04-14 ENCOUNTER — Telehealth: Payer: Self-pay | Admitting: Cardiovascular Disease

## 2020-04-14 MED ORDER — ISOSORB DINITRATE-HYDRALAZINE 20-37.5 MG PO TABS: 1.0000 | ORAL_TABLET | Freq: Three times a day (TID) | ORAL | 0 refills | Status: DC

## 2020-04-14 NOTE — Telephone Encounter (Signed)
New message   *STAT* If patient is at the pharmacy, call can be transferred to refill team.   1. Which medications need to be refilled? (please list name of each medication and dose if known)  prazosin (MINIPRESS) 1 MG capsule torsemide (DEMADEX) 20 MG tablet atorvastatin (LIPITOR) 80 MG tablet carvedilol (COREG) 12.5 MG tablet clopidogrel (PLAVIX) 75 MG tablet pantoprazole (PROTONIX) 40 MG tablet spironolactone (ALDACTONE) 25 MG tablet isosorbide-hydrALAZINE (BIDIL) 20-37.5 MG tablet   2. Which pharmacy/location (including street and city if local pharmacy) is medication to be sent to? [facility] [address] [place] - [address]  3. Do they need a 30 day or 90 day supply? 90 day   Patient is out of medication. Please fill today.

## 2020-04-14 NOTE — Telephone Encounter (Signed)
New Message  Pt c/o medication issue:  1. Name of Medication:  prazosin (MINIPRESS) 1 MG capsule torsemide (DEMADEX) 20 MG tablet atorvastatin (LIPITOR) 80 MG tablet carvedilol (COREG) 12.5 MG tablet clopidogrel (PLAVIX) 75 MG tablet pantoprazole (PROTONIX) 40 MG tablet spironolactone (ALDACTONE) 25 MG tablet isosorbide-hydrALAZINE (BIDIL) 20-37.5 MG tablet  2. How are you currently taking this medication (dosage and times per day)? As directed  3. Are you having a reaction (difficulty breathing--STAT)? Cramps  4. What is your medication issue? Patient is having cramps in his thigh. Wants to know which medication could be causing cramps. Please advise.

## 2020-04-14 NOTE — Telephone Encounter (Signed)
Left message for patient to call back  

## 2020-04-14 NOTE — Telephone Encounter (Signed)
Called pt's pharmacy and pt has enough medications until September, except isosorbide. I sent this medication to pt's pharmacy. The pharmacist stated that he would get these medications filled for the pt.

## 2020-04-17 NOTE — Telephone Encounter (Signed)
Returned call to patient. I asked if he continues to have leg swelling and he states his legs hurt when he walks up stairs. I asked about K+ in diet and he states he eats a lot of oranges, bananas and leafy green vegetables. I advised that we could do lab work to check electrolytes and he said he felt like he was going to throw up and laid the phone down. I could hear noise in the background and then the call was disconnected. Patient is also overdue for follow-up.

## 2020-08-21 ENCOUNTER — Other Ambulatory Visit: Payer: Self-pay | Admitting: Physician Assistant

## 2020-08-24 ENCOUNTER — Emergency Department (HOSPITAL_BASED_OUTPATIENT_CLINIC_OR_DEPARTMENT_OTHER): Payer: Medicaid Other

## 2020-08-24 ENCOUNTER — Other Ambulatory Visit: Payer: Self-pay

## 2020-08-24 ENCOUNTER — Encounter (HOSPITAL_COMMUNITY): Payer: Self-pay

## 2020-08-24 ENCOUNTER — Emergency Department (HOSPITAL_COMMUNITY)
Admission: EM | Admit: 2020-08-24 | Discharge: 2020-08-24 | Disposition: A | Discharge status: Home / Self Care | Payer: Medicaid Other | Attending: Emergency Medicine | Admitting: Emergency Medicine

## 2020-08-24 ENCOUNTER — Emergency Department (HOSPITAL_COMMUNITY): Payer: Medicaid Other

## 2020-08-24 DIAGNOSIS — I5023 Acute on chronic systolic (congestive) heart failure: Secondary | ICD-10-CM | POA: Diagnosis not present

## 2020-08-24 DIAGNOSIS — N183 Chronic kidney disease, stage 3 unspecified: Secondary | ICD-10-CM | POA: Insufficient documentation

## 2020-08-24 DIAGNOSIS — R0602 Shortness of breath: Secondary | ICD-10-CM | POA: Diagnosis present

## 2020-08-24 DIAGNOSIS — Z79899 Other long term (current) drug therapy: Secondary | ICD-10-CM | POA: Diagnosis not present

## 2020-08-24 DIAGNOSIS — I130 Hypertensive heart and chronic kidney disease with heart failure and stage 1 through stage 4 chronic kidney disease, or unspecified chronic kidney disease: Secondary | ICD-10-CM | POA: Diagnosis not present

## 2020-08-24 DIAGNOSIS — M7989 Other specified soft tissue disorders: Secondary | ICD-10-CM

## 2020-08-24 DIAGNOSIS — Z7982 Long term (current) use of aspirin: Secondary | ICD-10-CM | POA: Diagnosis not present

## 2020-08-24 DIAGNOSIS — I509 Heart failure, unspecified: Secondary | ICD-10-CM

## 2020-08-24 DIAGNOSIS — F1490 Cocaine use, unspecified, uncomplicated: Secondary | ICD-10-CM

## 2020-08-24 DIAGNOSIS — Z20822 Contact with and (suspected) exposure to covid-19: Secondary | ICD-10-CM | POA: Insufficient documentation

## 2020-08-24 LAB — BASIC METABOLIC PANEL
Anion gap: 11 (ref 5–15)
BUN: 22 mg/dL — ABNORMAL HIGH (ref 6–20)
CO2: 23 mmol/L (ref 22–32)
Calcium: 8.5 mg/dL — ABNORMAL LOW (ref 8.9–10.3)
Chloride: 107 mmol/L (ref 98–111)
Creatinine, Ser: 1.55 mg/dL — ABNORMAL HIGH (ref 0.61–1.24)
GFR, Estimated: 49 mL/min — ABNORMAL LOW (ref >60)
Glucose, Bld: 107 mg/dL — ABNORMAL HIGH (ref 70–99)
Potassium: 3.3 mmol/L — ABNORMAL LOW (ref 3.5–5.1)
Sodium: 141 mmol/L (ref 135–145)

## 2020-08-24 LAB — RESPIRATORY PANEL BY RT PCR (FLU A&B, COVID)
Influenza A by PCR: NEGATIVE
Influenza B by PCR: NEGATIVE
SARS Coronavirus 2 by RT PCR: NEGATIVE

## 2020-08-24 LAB — CBC WITH DIFFERENTIAL/PLATELET
Abs Immature Granulocytes: 0.02 10*3/uL (ref 0.00–0.07)
Basophils Absolute: 0 10*3/uL (ref 0.0–0.1)
Basophils Relative: 1 %
Eosinophils Absolute: 0.3 10*3/uL (ref 0.0–0.5)
Eosinophils Relative: 4 %
HCT: 37.1 % — ABNORMAL LOW (ref 39.0–52.0)
Hemoglobin: 12.1 g/dL — ABNORMAL LOW (ref 13.0–17.0)
Immature Granulocytes: 0 %
Lymphocytes Relative: 18 %
Lymphs Abs: 1.4 10*3/uL (ref 0.7–4.0)
MCH: 29.7 pg (ref 26.0–34.0)
MCHC: 32.6 g/dL (ref 30.0–36.0)
MCV: 90.9 fL (ref 80.0–100.0)
Monocytes Absolute: 0.5 10*3/uL (ref 0.1–1.0)
Monocytes Relative: 7 %
Neutro Abs: 5.1 10*3/uL (ref 1.7–7.7)
Neutrophils Relative %: 70 %
Platelets: 282 10*3/uL (ref 150–400)
RBC: 4.08 MIL/uL — ABNORMAL LOW (ref 4.22–5.81)
RDW: 14.9 % (ref 11.5–15.5)
WBC: 7.4 10*3/uL (ref 4.0–10.5)
nRBC: 0 % (ref 0.0–0.2)

## 2020-08-24 LAB — BRAIN NATRIURETIC PEPTIDE: B Natriuretic Peptide: 2551.8 pg/mL — ABNORMAL HIGH (ref 0.0–100.0)

## 2020-08-24 LAB — D-DIMER, QUANTITATIVE: D-Dimer, Quant: 0.7 ug/mL-FEU — ABNORMAL HIGH (ref 0.00–0.50)

## 2020-08-24 MED ORDER — TORSEMIDE 20 MG PO TABS: 20.0000 mg | ORAL_TABLET | Freq: Every day | ORAL | 0 refills | Status: DC

## 2020-08-24 MED ORDER — ISOSORB DINITRATE-HYDRALAZINE 20-37.5 MG PO TABS: 1.0000 | ORAL_TABLET | Freq: Three times a day (TID) | ORAL | 0 refills | Status: DC

## 2020-08-24 MED ORDER — SPIRONOLACTONE 25 MG PO TABS: 25.0000 mg | ORAL_TABLET | Freq: Every day | ORAL | 0 refills | Status: DC

## 2020-08-24 MED ORDER — FUROSEMIDE 10 MG/ML IJ SOLN
40.0000 mg | Freq: Once | INTRAMUSCULAR | Status: AC
  Administered 2020-08-24: 40 mg via INTRAVENOUS
  Filled 2020-08-24: qty 4

## 2020-08-24 NOTE — ED Provider Notes (Signed)
[facility]-EMERGENCY DEPT Provider Note   CSN: [id number] Arrival date & time: 08/24/20  0536     History Chief Complaint  Patient presents with  . Leg Swelling    Dale [name] is a 59 y.o. male presenting for evaluation of shortness of breath and leg swelling.  Patient states that the past 2 weeks, he has had worsening dyspnea on exertion, orthopnea, and bilateral leg swelling.  He states he had similar issue about 6 months ago, was placed on medication and symptoms improved.  Because his symptoms are improved, he stopped taking medication.  He also reports he does not like to take medication, this contributed to his decision making.  He denies fevers, chills, cough, chest pain, nausea, vomiting, Donnell pain.  He denies change in urination or bowel output.  He is currently not taking any medications every day.  He uses cocaine daily. Uses marijuana intermittently.  Denies tobacco or alcohol use (in the past 1.5 months).   Additional history taken from chart review.  Patient with a history of CHF, NSTEMI, hypertension, substance abuse, CKD.  He is supposed to be on several medications including Plavix, Coreg, torsemide, bidil.   HPI     Past Medical History:  Diagnosis Date  . Alcohol abuse   . Chronic combined systolic and diastolic CHF (congestive heart failure) (HCC)    a. 09/27/18 showed mild LVH, EF 20-25%, grade 2 DD, mild MR, severely dilated LA, mildly dilated RV with mildly reduced RV function, mod RAE.  Marland Kitchen Cocaine abuse (HCC)   . Hypertension     Patient Active Problem List   Diagnosis Date Noted  . Acute on chronic combined systolic (congestive) and diastolic (congestive) heart failure (HCC)   . Acute CHF (congestive heart failure) (HCC) 05/07/2019  . Nausea & vomiting 05/07/2019  . Acute exacerbation of CHF (congestive heart failure) (HCC) 03/11/2019  . Troponin level elevated 03/11/2019  . Hyperlipidemia 03/03/2019  . NSVT (nonsustained  ventricular tachycardia) (HCC) 03/03/2019  . Anxiety and depression   . NSTEMI (non-ST elevated myocardial infarction) (HCC) 02/28/2019  . Acute systolic heart failure (HCC) 11/08/2018  . Hypertensive urgency 09/28/2018  . Acute pulmonary edema (HCC) 09/28/2018  . Cocaine abuse (HCC) 09/28/2018  . Substance abuse (HCC) 09/28/2018  . Chronic kidney disease, stage II (mild) 09/28/2018  . Normochromic anemia 09/28/2018  . Acute systolic CHF (congestive heart failure) (HCC) 09/27/2018  . Essential hypertension 03/11/2016  . Obesity 03/11/2016  . Glucosuria 03/11/2016    Past Surgical History:  Procedure Laterality Date  . RIGHT/LEFT HEART CATH AND CORONARY ANGIOGRAPHY N/A 05/10/2019   Procedure: RIGHT/LEFT HEART CATH AND CORONARY ANGIOGRAPHY;  Surgeon: Yvonne Kendall, MD;  Location: [facility];  Service: Cardiovascular;  Laterality: N/A;       Family History  Problem Relation Age of Onset  . Hypertension Maternal Grandmother     Social History   Tobacco Use  . Smoking status: Never Smoker  . Smokeless tobacco: Never Used  Vaping Use  . Vaping Use: Never used  Substance Use Topics  . Alcohol use: Yes    Comment: 2 quarts a week   . Drug use: Yes    Frequency: 3.0 times per week    Types: Marijuana, Cocaine    Home Medications Prior to Admission medications   Medication Sig Start Date End Date Taking? Authorizing Provider  aspirin 81 MG EC tablet Take 1 tablet (81 mg total) by mouth daily. 06/24/19   Charlestine Night, PA-C  atorvastatin (LIPITOR) 80 MG tablet Take 1 tablet (80 mg total) by mouth daily. 07/23/19   Tereso Newcomer T, PA-C  carvedilol (COREG) 12.5 MG tablet Take 1 tablet (12.5 mg total) by mouth 2 (two) times daily with a meal. 07/23/19   Tereso Newcomer T, PA-C  clopidogrel (PLAVIX) 75 MG tablet Take 1 tablet (75 mg total) by mouth daily. 07/23/19   Tereso Newcomer T, PA-C  diclofenac sodium (VOLTAREN) 1 % GEL Apply 2 g topically 4 (four) times daily as  needed (pain).    [provider]  isosorbide-hydrALAZINE (BIDIL) 20-37.5 MG tablet Take 1 tablet by mouth 3 (three) times daily. Please make yearly appt with Dr. Elease Hashimoto for July for future refills. 1st attempt 08/24/20   Melis Trochez, PA-C  pantoprazole (PROTONIX) 40 MG tablet Take 1 tablet (40 mg total) by mouth daily. 07/23/19   Tereso Newcomer T, PA-C  prazosin (MINIPRESS) 1 MG capsule Take 1 capsule (1 mg total) by mouth daily. 07/23/19   Tereso Newcomer T, PA-C  spironolactone (ALDACTONE) 25 MG tablet Take 1 tablet (25 mg total) by mouth daily. 08/24/20   Emylee Decelle, PA-C  tiZANidine (ZANAFLEX) 4 MG tablet Take 4 mg by mouth 3 (three) times daily as needed for muscle spasms.    [provider]  torsemide (DEMADEX) 20 MG tablet Take 1 tablet (20 mg total) by mouth daily. Please make overdue appt with Dr. Elease Hashimoto before anymore refills. 1st attempt 08/24/20   Chandra Feger, PA-C    Allergies    Hydrocodone  Review of Systems   Review of Systems  Respiratory: Positive for shortness of breath.   Cardiovascular: Positive for leg swelling.  All other systems reviewed and are negative.   Physical Exam Updated Vital Signs BP (!) 143/96   Pulse 97   Temp 97.7 F (36.5 C) (Oral)   Resp (!) 33   SpO2 97%   Physical Exam Vitals and nursing note reviewed.  Constitutional:      General: He is not in acute distress.    Appearance: He is well-developed. He is obese.     Comments: Appears nontoxic  HENT:     Head: Normocephalic and atraumatic.  Eyes:     Extraocular Movements: Extraocular movements intact.     Conjunctiva/sclera: Conjunctivae normal.     Pupils: Pupils are equal, round, and reactive to light.  Cardiovascular:     Rate and Rhythm: Normal rate and regular rhythm.     Pulses: Normal pulses.  Pulmonary:     Effort: Pulmonary effort is normal. No respiratory distress.     Breath sounds: Normal breath sounds. No wheezing.     Comments: Speaking  in full sentence. Clear lung sounds. spo2 stable on RA Abdominal:     General: There is no distension.     Palpations: Abdomen is soft. There is no mass.     Tenderness: There is no abdominal tenderness. There is no guarding or rebound.  Musculoskeletal:        General: Normal range of motion.     Cervical back: Normal range of motion and neck supple.     Right lower leg: Edema present.     Left lower leg: Edema present.     Comments: 1+ pitting edema bilaterally. Slightly increased swelling of L leg.   Skin:    General: Skin is warm and dry.     Capillary Refill: Capillary refill takes less than 2 seconds.  Neurological:     Mental Status:  He is alert and oriented to person, place, and time.     ED Results / Procedures / Treatments   Labs (all labs ordered are listed, but only abnormal results are displayed) Labs Reviewed  CBC WITH DIFFERENTIAL/PLATELET - Abnormal; Notable for the following components:      Result Value   RBC 4.08 (*)    Hemoglobin 12.1 (*)    HCT 37.1 (*)    All other components within normal limits  BASIC METABOLIC PANEL - Abnormal; Notable for the following components:   Potassium 3.3 (*)    Glucose, Bld 107 (*)    BUN 22 (*)    Creatinine, Ser 1.55 (*)    Calcium 8.5 (*)    GFR, Estimated 49 (*)    All other components within normal limits  BRAIN NATRIURETIC PEPTIDE - Abnormal; Notable for the following components:   B Natriuretic Peptide 2,551.8 (*)    All other components within normal limits  D-DIMER, QUANTITATIVE (NOT AT [facility]) - Abnormal; Notable for the following components:   D-Dimer, Quant 0.70 (*)    All other components within normal limits  RESPIRATORY PANEL BY RT PCR (FLU A&B, COVID)    EKG None  Radiology DG Chest 2 View  Result Date: 08/24/2020 CLINICAL DATA:  Shortness of breath. Bilateral lower extremity edema. EXAM: CHEST - 2 VIEW COMPARISON:  06/24/2019. FINDINGS: Stable cardiomegaly. Mild left mid lung and bibasilar  subsegmental atelectasis. No pleural effusion or pneumothorax. Degenerative change thoracic spine. IMPRESSION: 1. Stable cardiomegaly. 2. Mild left mid lung and bibasilar subsegmental atelectasis. Electronically Signed   By: Maisie Fus  Register   On: 08/24/2020 06:56    Procedures Procedures (including critical care time)  Medications Ordered in ED Medications  furosemide (LASIX) injection 40 mg (40 mg Intravenous Given 08/24/20 0802)    ED Course  I have reviewed the triage vital signs and the nursing notes.  Pertinent labs & imaging results that were available during my care of the patient were reviewed by me and considered in my medical decision making (see chart for details).   MDM Rules/Calculators/A&P                          Patient presenting for evaluation of dyspnea on exertion, orthopnea, and leg swelling.  On exam, patient is nontoxic.  Pulmonary exam is overall reassuring, speaking in full sentences and sats stable on room air.  He does have mild leg swelling.  History of heart failure, is not currently taking anything for this.  This is most likely the cause of his symptoms.  Labs obtained from triage interpreted by me.  Overall consistent with CHF patient's BNP is elevated at 2500.  He has mild worsening kidney function, however no significant change from previous.  Dimer ordered by previous provider is mildly elevated.  Patient without chest pain or pleurisy.  Discussed option of CTA to rule out PE versus treatment with lasix and reassessment.  Patient does not want a CT at this time.  Will obtain ultrasound to rule out DVT.  [place] negative for DVT. On reassessment after Lasix, patient reports sxs are much improved. He was able to ambulate without hypoxia. Discussed importance of taking medications as prescribed and cocaine cessation. Pt to f/u with cardiology. At this time, pt appears safe for d/c. Return precautions given. Pt states he understands and agrees to plan.   Final  Clinical Impression(s) / ED Diagnoses Final diagnoses:  Acute on chronic  heart failure, unspecified heart failure type (HCC)  Cocaine use    Rx / DC Orders ED Discharge Orders         Ordered    torsemide (DEMADEX) 20 MG tablet  Daily       Note to Pharmacy: Please call our office to schedule an overdue appointment with Dr. Elease Hashimoto before anymore refills. [phone]. Thank you 1st attempt   08/24/20 1008    spironolactone (ALDACTONE) 25 MG tablet  Daily        08/24/20 1008    isosorbide-hydrALAZINE (BIDIL) 20-37.5 MG tablet  3 times daily       Note to Pharmacy: Please call our office to schedule an yearly appointment with Dr. Elease Hashimoto for July for future refills. [phone]. Thank you 1st attempt   08/24/20 [address] PA-C 08/24/20 1014    [place], DO 08/24/20 1058

## 2020-08-24 NOTE — ED Triage Notes (Signed)
Patient arrived with complaints of bilateral leg edema over the last two weeks. Reports stopped taking his home lasix 6 months ago. Admits to crack cocaine use tonight.

## 2020-08-24 NOTE — ED Notes (Signed)
Pt O2 at 92% after marching in place and ambulating around the room.

## 2020-08-24 NOTE — Discharge Instructions (Addendum)
It is important you take medications as prescribed, especially your torsemide and spironolactone and isosorbide. Eat a low-salt diet to help with your heart failure. Follow-up either with the heart failure clinic or with Tereso Newcomer for further evaluation of your heart failure. Return to the emergency room if you develop fevers, chest pain, severe worsening shortness of breath, any new, worsening, or concerning symptoms.  Stop using cocaine. There is information about resources in the paperwork.

## 2020-08-27 ENCOUNTER — Emergency Department (HOSPITAL_COMMUNITY)
Admission: EM | Admit: 2020-08-27 | Discharge: 2020-08-28 | Disposition: A | Discharge status: Home / Self Care | Payer: Medicaid Other | Attending: Emergency Medicine | Admitting: Emergency Medicine

## 2020-08-27 ENCOUNTER — Other Ambulatory Visit: Payer: Self-pay

## 2020-08-27 DIAGNOSIS — I214 Non-ST elevation (NSTEMI) myocardial infarction: Secondary | ICD-10-CM | POA: Diagnosis not present

## 2020-08-27 DIAGNOSIS — I5043 Acute on chronic combined systolic (congestive) and diastolic (congestive) heart failure: Secondary | ICD-10-CM | POA: Insufficient documentation

## 2020-08-27 DIAGNOSIS — Z7982 Long term (current) use of aspirin: Secondary | ICD-10-CM | POA: Diagnosis not present

## 2020-08-27 DIAGNOSIS — Z9119 Patient's noncompliance with other medical treatment and regimen: Secondary | ICD-10-CM | POA: Diagnosis not present

## 2020-08-27 DIAGNOSIS — I509 Heart failure, unspecified: Secondary | ICD-10-CM

## 2020-08-27 DIAGNOSIS — N182 Chronic kidney disease, stage 2 (mild): Secondary | ICD-10-CM | POA: Diagnosis not present

## 2020-08-27 DIAGNOSIS — I130 Hypertensive heart and chronic kidney disease with heart failure and stage 1 through stage 4 chronic kidney disease, or unspecified chronic kidney disease: Secondary | ICD-10-CM | POA: Insufficient documentation

## 2020-08-27 DIAGNOSIS — F1910 Other psychoactive substance abuse, uncomplicated: Secondary | ICD-10-CM | POA: Diagnosis not present

## 2020-08-27 DIAGNOSIS — Z79899 Other long term (current) drug therapy: Secondary | ICD-10-CM | POA: Insufficient documentation

## 2020-08-27 DIAGNOSIS — R0602 Shortness of breath: Secondary | ICD-10-CM | POA: Diagnosis present

## 2020-08-28 ENCOUNTER — Other Ambulatory Visit: Payer: Self-pay

## 2020-08-28 ENCOUNTER — Emergency Department (HOSPITAL_COMMUNITY): Payer: Medicaid Other

## 2020-08-28 ENCOUNTER — Encounter (HOSPITAL_COMMUNITY): Payer: Self-pay

## 2020-08-28 LAB — CBC WITH DIFFERENTIAL/PLATELET
Abs Immature Granulocytes: 0.04 10*3/uL (ref 0.00–0.07)
Basophils Absolute: 0.1 10*3/uL (ref 0.0–0.1)
Basophils Relative: 1 %
Eosinophils Absolute: 0.3 10*3/uL (ref 0.0–0.5)
Eosinophils Relative: 4 %
HCT: 38.7 % — ABNORMAL LOW (ref 39.0–52.0)
Hemoglobin: 12.8 g/dL — ABNORMAL LOW (ref 13.0–17.0)
Immature Granulocytes: 1 %
Lymphocytes Relative: 25 %
Lymphs Abs: 1.9 10*3/uL (ref 0.7–4.0)
MCH: 29.7 pg (ref 26.0–34.0)
MCHC: 33.1 g/dL (ref 30.0–36.0)
MCV: 89.8 fL (ref 80.0–100.0)
Monocytes Absolute: 0.7 10*3/uL (ref 0.1–1.0)
Monocytes Relative: 9 %
Neutro Abs: 4.5 10*3/uL (ref 1.7–7.7)
Neutrophils Relative %: 60 %
Platelets: 293 10*3/uL (ref 150–400)
RBC: 4.31 MIL/uL (ref 4.22–5.81)
RDW: 14.5 % (ref 11.5–15.5)
WBC: 7.5 10*3/uL (ref 4.0–10.5)
nRBC: 0 % (ref 0.0–0.2)

## 2020-08-28 LAB — COMPREHENSIVE METABOLIC PANEL
ALT: 50 U/L — ABNORMAL HIGH (ref 0–44)
AST: 49 U/L — ABNORMAL HIGH (ref 15–41)
Albumin: 3.3 g/dL — ABNORMAL LOW (ref 3.5–5.0)
Alkaline Phosphatase: 77 U/L (ref 38–126)
Anion gap: 12 (ref 5–15)
BUN: 41 mg/dL — ABNORMAL HIGH (ref 6–20)
CO2: 23 mmol/L (ref 22–32)
Calcium: 8.9 mg/dL (ref 8.9–10.3)
Chloride: 104 mmol/L (ref 98–111)
Creatinine, Ser: 1.81 mg/dL — ABNORMAL HIGH (ref 0.61–1.24)
GFR, Estimated: 43 mL/min — ABNORMAL LOW (ref >60)
Glucose, Bld: 126 mg/dL — ABNORMAL HIGH (ref 70–99)
Potassium: 5 mmol/L (ref 3.5–5.1)
Sodium: 139 mmol/L (ref 135–145)
Total Bilirubin: 1.1 mg/dL (ref 0.3–1.2)
Total Protein: 6.6 g/dL (ref 6.5–8.1)

## 2020-08-28 LAB — BRAIN NATRIURETIC PEPTIDE: B Natriuretic Peptide: 2837.4 pg/mL — ABNORMAL HIGH (ref 0.0–100.0)

## 2020-08-28 LAB — RAPID URINE DRUG SCREEN, HOSP PERFORMED
Amphetamines: NOT DETECTED
Barbiturates: NOT DETECTED
Benzodiazepines: NOT DETECTED
Cocaine: POSITIVE — AB
Opiates: NOT DETECTED
Tetrahydrocannabinol: POSITIVE — AB

## 2020-08-28 MED ORDER — ONDANSETRON HCL 4 MG/2ML IJ SOLN
4.0000 mg | Freq: Once | INTRAMUSCULAR | Status: AC
  Administered 2020-08-28: 4 mg via INTRAVENOUS
  Filled 2020-08-28: qty 2

## 2020-08-28 MED ORDER — ACETAMINOPHEN 500 MG PO TABS
1000.0000 mg | ORAL_TABLET | Freq: Once | ORAL | Status: AC
  Administered 2020-08-28: 1000 mg via ORAL
  Filled 2020-08-28: qty 2

## 2020-08-28 MED ORDER — FUROSEMIDE 10 MG/ML IJ SOLN
40.0000 mg | Freq: Once | INTRAMUSCULAR | Status: AC
  Administered 2020-08-28: 40 mg via INTRAVENOUS
  Filled 2020-08-28: qty 4

## 2020-08-28 NOTE — ED Provider Notes (Signed)
[facility]-EMERGENCY DEPT Provider Note   CSN: [id number] Arrival date & time: 08/27/20  2343     History Chief Complaint  Patient presents with  . Shortness of Breath    pt c/o sob worsening when laying down .pt was seen here for same a few days ago. pt was unable to go get prescibed meds.     Dale [name] is a 59 y.o. male.  Patient with history of HTN, polysubstance abuse (cocaine, marijuana and alcohol), CHF, medication noncompliance presents with SOB, worsening over the last 2 days. Symptoms are worse when he lies down. No cough, fever, chest pain, vomiting. He reports LE swelling and pain in his left thigh. He states he was seen 2 days ago in the ED for similar symptoms, felt better on discharge but failed to fill his prescriptions. He states that he has been out of all medications for close to 6 months.    Shortness of Breath Associated symptoms: no chest pain and no fever        Past Medical History:  Diagnosis Date  . Alcohol abuse   . Chronic combined systolic and diastolic CHF (congestive heart failure) (HCC)    a. 09/27/18 showed mild LVH, EF 20-25%, grade 2 DD, mild MR, severely dilated LA, mildly dilated RV with mildly reduced RV function, mod RAE.  Marland Kitchen Cocaine abuse (HCC)   . Hypertension     Patient Active Problem List   Diagnosis Date Noted  . Acute on chronic combined systolic (congestive) and diastolic (congestive) heart failure (HCC)   . Acute CHF (congestive heart failure) (HCC) 05/07/2019  . Nausea & vomiting 05/07/2019  . Acute exacerbation of CHF (congestive heart failure) (HCC) 03/11/2019  . Troponin level elevated 03/11/2019  . Hyperlipidemia 03/03/2019  . NSVT (nonsustained ventricular tachycardia) (HCC) 03/03/2019  . Anxiety and depression   . NSTEMI (non-ST elevated myocardial infarction) (HCC) 02/28/2019  . Acute systolic heart failure (HCC) 11/08/2018  . Hypertensive urgency 09/28/2018  . Acute pulmonary edema (HCC)  09/28/2018  . Cocaine abuse (HCC) 09/28/2018  . Substance abuse (HCC) 09/28/2018  . Chronic kidney disease, stage II (mild) 09/28/2018  . Normochromic anemia 09/28/2018  . Acute systolic CHF (congestive heart failure) (HCC) 09/27/2018  . Essential hypertension 03/11/2016  . Obesity 03/11/2016  . Glucosuria 03/11/2016    Past Surgical History:  Procedure Laterality Date  . RIGHT/LEFT HEART CATH AND CORONARY ANGIOGRAPHY N/A 05/10/2019   Procedure: RIGHT/LEFT HEART CATH AND CORONARY ANGIOGRAPHY;  Surgeon: Yvonne Kendall, MD;  Location: [facility];  Service: Cardiovascular;  Laterality: N/A;       Family History  Problem Relation Age of Onset  . Hypertension Maternal Grandmother     Social History   Tobacco Use  . Smoking status: Never Smoker  . Smokeless tobacco: Never Used  Vaping Use  . Vaping Use: Never used  Substance Use Topics  . Alcohol use: Yes    Comment: 2 quarts a week   . Drug use: Yes    Frequency: 3.0 times per week    Types: Marijuana, Cocaine    Home Medications Prior to Admission medications   Medication Sig Start Date End Date Taking? Authorizing Provider  aspirin 81 MG EC tablet Take 1 tablet (81 mg total) by mouth daily. Patient not taking: Reported on 08/28/2020 06/24/19   Charlestine Night, PA-C  atorvastatin (LIPITOR) 80 MG tablet Take 1 tablet (80 mg total) by mouth daily. Patient not taking: Reported on 08/28/2020 07/23/19  Alben Spittle, Scott T, PA-C  carvedilol (COREG) 12.5 MG tablet Take 1 tablet (12.5 mg total) by mouth 2 (two) times daily with a meal. Patient not taking: Reported on 08/28/2020 07/23/19   Tereso Newcomer T, PA-C  clopidogrel (PLAVIX) 75 MG tablet Take 1 tablet (75 mg total) by mouth daily. Patient not taking: Reported on 08/28/2020 07/23/19   Tereso Newcomer T, PA-C  diclofenac sodium (VOLTAREN) 1 % GEL Apply 2 g topically 4 (four) times daily as needed (pain). Patient not taking: Reported on 08/28/2020    [provider]  isosorbide-hydrALAZINE (BIDIL) 20-37.5 MG tablet Take 1 tablet by mouth 3 (three) times daily. Please make yearly appt with Dr. Elease Hashimoto for July for future refills. 1st attempt Patient not taking: Reported on 08/28/2020 08/24/20   Caccavale, Sophia, PA-C  pantoprazole (PROTONIX) 40 MG tablet Take 1 tablet (40 mg total) by mouth daily. Patient not taking: Reported on 08/28/2020 07/23/19   Tereso Newcomer T, PA-C  prazosin (MINIPRESS) 1 MG capsule Take 1 capsule (1 mg total) by mouth daily. Patient not taking: Reported on 08/28/2020 07/23/19   Tereso Newcomer T, PA-C  spironolactone (ALDACTONE) 25 MG tablet Take 1 tablet (25 mg total) by mouth daily. Patient not taking: Reported on 08/28/2020 08/24/20   Caccavale, Sophia, PA-C  torsemide (DEMADEX) 20 MG tablet Take 1 tablet (20 mg total) by mouth daily. Please make overdue appt with Dr. Elease Hashimoto before anymore refills. 1st attempt Patient not taking: Reported on 08/28/2020 08/24/20   Caccavale, Sophia, PA-C    Allergies    Hydrocodone  Review of Systems   Review of Systems  Constitutional: Negative for chills and fever.  HENT: Negative.   Eyes: Negative for visual disturbance.  Respiratory: Positive for shortness of breath.   Cardiovascular: Positive for leg swelling. Negative for chest pain.  Gastrointestinal: Negative.   Genitourinary: Negative.   Musculoskeletal: Negative.   Skin: Negative.   Neurological: Negative.   Psychiatric/Behavioral: Negative for confusion.    Physical Exam Updated Vital Signs BP (!) 140/92 (BP Location: Right Arm)   Pulse 91   Temp (!) 97.5 F (36.4 C) (Oral)   Resp (!) 21   SpO2 100%   Physical Exam Vitals and nursing note reviewed.  Constitutional:      Appearance: He is well-developed.  HENT:     Head: Normocephalic.  Cardiovascular:     Rate and Rhythm: Normal rate and regular rhythm.  Pulmonary:     Effort: Pulmonary effort is normal.     Breath sounds: Normal breath sounds. No  wheezing, rhonchi or rales.  Abdominal:     General: Bowel sounds are normal.     Palpations: Abdomen is soft.     Tenderness: There is no abdominal tenderness. There is no guarding or rebound.  Musculoskeletal:        General: Normal range of motion.     Cervical back: Normal range of motion and neck supple.     Right lower leg: Edema present.     Left lower leg: Edema present.     Comments: 1+ edema bilateral LE's.  Skin:    General: Skin is warm and dry.  Neurological:     General: No focal deficit present.     Mental Status: He is alert and oriented to person, place, and time.     ED Results / Procedures / Treatments   Labs (all labs ordered are listed, but only abnormal results are displayed) Labs Reviewed  CBC WITH DIFFERENTIAL/PLATELET -  Abnormal; Notable for the following components:      Result Value   Hemoglobin 12.8 (*)    HCT 38.7 (*)    All other components within normal limits  BRAIN NATRIURETIC PEPTIDE  COMPREHENSIVE METABOLIC PANEL  RAPID URINE DRUG SCREEN, HOSP PERFORMED    EKG EKG Interpretation  Date/Time:  Monday August 28 2020 00:31:43 EDT Ventricular Rate:  90 PR Interval:    QRS Duration: 105 QT Interval:  403 QTC Calculation: 494 R Axis:   45 Text Interpretation: Sinus rhythm Ventricular premature complex Prolonged PR interval Left atrial enlargement Nonspecific T abnormalities, lateral leads Borderline prolonged QT interval No significant change since last tracing Confirmed by Zadie Rhine ([id number]) on 08/28/2020 12:34:53 AM   Radiology No results found.  Procedures Procedures (including critical care time)  Medications Ordered in ED Medications - No data to display  ED Course  I have reviewed the triage vital signs and the nursing notes.  Pertinent labs & imaging results that were available during my care of the patient were reviewed by me and considered in my medical decision making (see chart for details).    MDM  Rules/Calculators/A&P                          Patient to ED with SOB/orthopnea, history of CHF, medication noncompliance.   The patient is overall well appearing. No hypoxia. Lung sounds are essentially clear. There is only mild edema.   Chart reviewed. The patient states he has not taken his medication in 6 months, but there is documented conversations with the patient from June of this year and verified he was taking medications as prescribed Maurice March, 04/14/20). Also a conversation with a pharmacist that reported the patient had enough of his medications to last until September, except isosorbide, which was called in at that time (04/14/20, Adriana Simas, [place]). His UDS is positive for cocaine indicating ongoing use. COVID test 10/21 negative - do not need to repeat.   IV Lasix provided, 40 mg. He diuresis a total of about 2 liters. On recheck, he is lying down comfortably sleeping, normal respirations, normal O2 saturation. The patient is ambulated and maintains mid-90's O2.   He is felt appropriate for discharge home.   Final Clinical Impression(s) / ED Diagnoses Final diagnoses:  None   1. Mild CHF exacerbation 2. Medication noncompliance 3. Polysubstance abuse  Rx / DC Orders ED Discharge Orders    None       Elpidio Anis, Cordelia Poche 08/28/20 0109    Zadie Rhine, MD 08/28/20 [phone]

## 2020-08-28 NOTE — ED Provider Notes (Signed)
Patient seen/examined in the Emergency Department in conjunction with Advanced [facility] Provider [place]  Patient reports shortness of breath and leg pain Exam : awake/alert, no distress.  Lungs overall clear on exam, symmetric LE edema noted Plan: monitor in ED, check CXR/labs and reassess    Zadie Rhine, MD 08/28/20 [phone]

## 2020-08-28 NOTE — ED Notes (Signed)
PT. Walked in hallway with [facility]. Pt Sats. stayed in the mid 90's

## 2020-08-28 NOTE — Progress Notes (Signed)
08/28/2020 443 pm TOC CM contacted pt and left message with roommate for pt to return call. No medications listed. Will follow up to see if pt want substance abuse community resources to follow up. ED provider updated. Isidoro Donning RN CCM, [facility] ED TOC CM [phone]

## 2020-08-28 NOTE — Discharge Instructions (Addendum)
It is important that you take your medications as prescribed and stop doing cocaine to avoid getting fluid overloaded again.  A social worker will contact you later today to make sure you have the ability to obtain your medications.   Follow up with your cardiologist for recheck in 1 week.  Substance Abuse Treatment Programs  Intensive Outpatient Programs [facility]     [address]      [place], [place]                   [phone]       [facility] [address] [place] [address] [place], [place] [phone]  [name]     (Inpatient and outpatient)     [address]           [phone]    [facility] [phone] (Suboxone and Methadone)  [address]      [place], [place] [address]      [phone]       [address] [place], [place] [phone]  [facility] (Outpatient/Inpatient, Chemical)    (insurance only) [phone]             [facility] (Groups & Residential) [place], [place] [phone]     [facility]     [address]     [place], [place]      [phone]       [facility] (for caregivers and family) [phone] [place], [place] [phone]      Residential Treatment Programs [facility]      [address] [place], [place] [address]  [phone]       [facility] [address], [place], [place] [address] [phone]  [facility]        [phone]       [facility] [phone]  [facility] ([facility]             [address]                                         [place], [place]                                                [phone] or [phone]                               [facility] [address] [place], [address] [phone]  [facility]    [address]      [place], [place]     [phone]       [facility]  [facility] [address] [place], [place] [phone]  [facility]   [address] [place], [place] [address]     [phone]      Admissions: 8am-3pm M-F  [facility] ([facility]) [address] [place], [place] [phone]  [facility]: Residential Program [phone] Days)   [place], [place]      [phone] or [phone]     [facility]: [facility] [place], [place] (Walk  in Hours over the weekend or by referral)  [facility] [address] [place], [place], [place] [address] [phone]  Crisis Mobile: [facility]:  [phone] (for crisis response 24 hours a day) [facility] Hotline:      [phone] Outpatient Psychiatry and Counseling  [facility]: Mobile Crisis Management 24 hours:  [phone]  [facility] of the [employer] sliding scale fee and walk in schedule: M-F 8am-12pm/1pm-3pm [address]  [place], [place] [address] [phone]  [facility] [address] [place], [place] [address] [phone]  [facility] ([facility] [employer])- new patient walk-in appointments available Monday - Friday 8am -3pm.          [address] [place], [place] [address] [phone] or crisis line- [phone]  [facility]/ Intensive Outpatient Therapy Program [address] [place], [place] [address] [phone]  [facility]                  Crisis Services      [phone] [address]     [place], [place] [address]                 [facility]   [facility] [phone]. [address] [place], [place] [address]   [facility]          [address]  [place], [place] [address]       [phone]  [facility] [address] [place], [place]  [address] [phone]  [facility]    [address]    [place], [place] [address]     [phone]       Andee Poles, MD     [address]     [place] [address]     [phone]       [facility] [address] [place] [address]  [facility]     [address] [place], [place]      [phone]       [facility] Eulogio Ditch, MD [address] [place], [place] [address] [phone]  [facility]     [address]     [place], [place] [address]     [phone]       [facility] [address] [place], [place] [address] [phone] Resources for Temporary Residential Assistance/Crisis Centers  [facility]) M-F 8am-3pm   [address] [place], [place] [address]   [phone] Services include: laundry, barbering, support groups, case management, phone  & computer access, showers, AA/NA mtgs, mental health/substance abuse nurse, job skills class, disability information, VA assistance, spiritual classes, etc.   HOMELESS SHELTERS  [facility]     [facility]   [address] [place]     [phone]              [employer] (women and children)       [address] [place], [place] [address] [phone] [email or web address] for application and process Application Required  [facility]   [address]    [place] [address]     [phone]  [place], [place] [address] [phone]([email or web address] application appt.) Application Required  [facility] (women only)    [address]     [place], [place] [address]     [phone]      Intake starts 6pm daily Need valid ID, SSC, & Police report [employer] [address] [place], [place] [phone] Application Required  [employer] (men only)     [address]      [place], [place]       [facility] (Pregnant women only) [address] [place], [place]  [facility]      [place] [address]      [place], [place] [address]     [phone]             [facility] [address] [place], [place] 90 day commitment/SA/Application process  [facility](men only)     [address]     [place], [place]       Check-in at [facility] [address] [place], [place] [address] [phone] Men/Women/Women and Children must be there by 7 pm  [place], [place]

## 2020-08-30 ENCOUNTER — Ambulatory Visit: Payer: Medicaid Other | Admitting: Cardiovascular Disease

## 2020-09-08 ENCOUNTER — Encounter (HOSPITAL_COMMUNITY): Payer: Self-pay

## 2020-09-08 ENCOUNTER — Emergency Department (HOSPITAL_COMMUNITY): Payer: Medicaid Other

## 2020-09-08 ENCOUNTER — Other Ambulatory Visit: Payer: Self-pay

## 2020-09-08 ENCOUNTER — Inpatient Hospital Stay (HOSPITAL_COMMUNITY)
Admission: EM | Admit: 2020-09-08 | Discharge: 2020-09-11 | DRG: 291 | Disposition: A | Discharge status: Home / Self Care | Payer: Medicaid Other | Attending: Internal Medicine | Admitting: Internal Medicine

## 2020-09-08 DIAGNOSIS — F1910 Other psychoactive substance abuse, uncomplicated: Secondary | ICD-10-CM

## 2020-09-08 DIAGNOSIS — Z79899 Other long term (current) drug therapy: Secondary | ICD-10-CM

## 2020-09-08 DIAGNOSIS — I248 Other forms of acute ischemic heart disease: Secondary | ICD-10-CM | POA: Diagnosis present

## 2020-09-08 DIAGNOSIS — D631 Anemia in chronic kidney disease: Secondary | ICD-10-CM | POA: Diagnosis present

## 2020-09-08 DIAGNOSIS — I472 Ventricular tachycardia: Secondary | ICD-10-CM | POA: Diagnosis not present

## 2020-09-08 DIAGNOSIS — E785 Hyperlipidemia, unspecified: Secondary | ICD-10-CM | POA: Diagnosis present

## 2020-09-08 DIAGNOSIS — I700 Atherosclerosis of aorta: Secondary | ICD-10-CM | POA: Diagnosis present

## 2020-09-08 DIAGNOSIS — Z8249 Family history of ischemic heart disease and other diseases of the circulatory system: Secondary | ICD-10-CM | POA: Diagnosis not present

## 2020-09-08 DIAGNOSIS — Z20822 Contact with and (suspected) exposure to covid-19: Secondary | ICD-10-CM | POA: Diagnosis present

## 2020-09-08 DIAGNOSIS — I130 Hypertensive heart and chronic kidney disease with heart failure and stage 1 through stage 4 chronic kidney disease, or unspecified chronic kidney disease: Principal | ICD-10-CM | POA: Diagnosis present

## 2020-09-08 DIAGNOSIS — I5043 Acute on chronic combined systolic (congestive) and diastolic (congestive) heart failure: Secondary | ICD-10-CM | POA: Diagnosis present

## 2020-09-08 DIAGNOSIS — E872 Acidosis: Secondary | ICD-10-CM | POA: Diagnosis present

## 2020-09-08 DIAGNOSIS — I5021 Acute systolic (congestive) heart failure: Secondary | ICD-10-CM | POA: Diagnosis not present

## 2020-09-08 DIAGNOSIS — N182 Chronic kidney disease, stage 2 (mild): Secondary | ICD-10-CM | POA: Diagnosis present

## 2020-09-08 DIAGNOSIS — I428 Other cardiomyopathies: Secondary | ICD-10-CM | POA: Diagnosis present

## 2020-09-08 DIAGNOSIS — F1210 Cannabis abuse, uncomplicated: Secondary | ICD-10-CM | POA: Diagnosis present

## 2020-09-08 DIAGNOSIS — Z7151 Drug abuse counseling and surveillance of drug abuser: Secondary | ICD-10-CM

## 2020-09-08 DIAGNOSIS — I2582 Chronic total occlusion of coronary artery: Secondary | ICD-10-CM | POA: Diagnosis present

## 2020-09-08 DIAGNOSIS — I509 Heart failure, unspecified: Secondary | ICD-10-CM

## 2020-09-08 DIAGNOSIS — Z6836 Body mass index (BMI) 36.0-36.9, adult: Secondary | ICD-10-CM | POA: Diagnosis not present

## 2020-09-08 DIAGNOSIS — Z7902 Long term (current) use of antithrombotics/antiplatelets: Secondary | ICD-10-CM

## 2020-09-08 DIAGNOSIS — I252 Old myocardial infarction: Secondary | ICD-10-CM | POA: Diagnosis not present

## 2020-09-08 DIAGNOSIS — I429 Cardiomyopathy, unspecified: Secondary | ICD-10-CM | POA: Diagnosis not present

## 2020-09-08 DIAGNOSIS — I2510 Atherosclerotic heart disease of native coronary artery without angina pectoris: Secondary | ICD-10-CM

## 2020-09-08 DIAGNOSIS — Z7982 Long term (current) use of aspirin: Secondary | ICD-10-CM

## 2020-09-08 DIAGNOSIS — F1410 Cocaine abuse, uncomplicated: Secondary | ICD-10-CM | POA: Diagnosis present

## 2020-09-08 DIAGNOSIS — I10 Essential (primary) hypertension: Secondary | ICD-10-CM

## 2020-09-08 DIAGNOSIS — Z9114 Patient's other noncompliance with medication regimen: Secondary | ICD-10-CM

## 2020-09-08 DIAGNOSIS — E669 Obesity, unspecified: Secondary | ICD-10-CM | POA: Diagnosis present

## 2020-09-08 DIAGNOSIS — R590 Localized enlarged lymph nodes: Secondary | ICD-10-CM | POA: Diagnosis present

## 2020-09-08 DIAGNOSIS — Z9119 Patient's noncompliance with other medical treatment and regimen: Secondary | ICD-10-CM | POA: Diagnosis not present

## 2020-09-08 DIAGNOSIS — Z885 Allergy status to narcotic agent status: Secondary | ICD-10-CM

## 2020-09-08 LAB — BASIC METABOLIC PANEL
Anion gap: 12 (ref 5–15)
BUN: 36 mg/dL — ABNORMAL HIGH (ref 6–20)
CO2: 19 mmol/L — ABNORMAL LOW (ref 22–32)
Calcium: 8.5 mg/dL — ABNORMAL LOW (ref 8.9–10.3)
Chloride: 108 mmol/L (ref 98–111)
Creatinine, Ser: 1.3 mg/dL — ABNORMAL HIGH (ref 0.61–1.24)
GFR, Estimated: >60 mL/min (ref >60)
Glucose, Bld: 110 mg/dL — ABNORMAL HIGH (ref 70–99)
Potassium: 3.9 mmol/L (ref 3.5–5.1)
Sodium: 139 mmol/L (ref 135–145)

## 2020-09-08 LAB — CREATININE, SERUM
Creatinine, Ser: 1.47 mg/dL — ABNORMAL HIGH (ref 0.61–1.24)
GFR, Estimated: 55 mL/min — ABNORMAL LOW (ref >60)

## 2020-09-08 LAB — CBC
HCT: 36.7 % — ABNORMAL LOW (ref 39.0–52.0)
HCT: 39 % (ref 39.0–52.0)
Hemoglobin: 12.1 g/dL — ABNORMAL LOW (ref 13.0–17.0)
Hemoglobin: 12.1 g/dL — ABNORMAL LOW (ref 13.0–17.0)
MCH: 29.7 pg (ref 26.0–34.0)
MCH: 29.3 pg (ref 26.0–34.0)
MCHC: 33 g/dL (ref 30.0–36.0)
MCHC: 31 g/dL (ref 30.0–36.0)
MCV: 90 fL (ref 80.0–100.0)
MCV: 94.4 fL (ref 80.0–100.0)
Platelets: 298 10*3/uL (ref 150–400)
Platelets: 282 10*3/uL (ref 150–400)
RBC: 4.08 MIL/uL — ABNORMAL LOW (ref 4.22–5.81)
RBC: 4.13 MIL/uL — ABNORMAL LOW (ref 4.22–5.81)
RDW: 15.2 % (ref 11.5–15.5)
RDW: 15.4 % (ref 11.5–15.5)
WBC: 7.2 10*3/uL (ref 4.0–10.5)
WBC: 6.2 10*3/uL (ref 4.0–10.5)
nRBC: 0 % (ref 0.0–0.2)
nRBC: 0 % (ref 0.0–0.2)

## 2020-09-08 LAB — TROPONIN I (HIGH SENSITIVITY)
Troponin I (High Sensitivity): 32 ng/L — ABNORMAL HIGH (ref <18)
Troponin I (High Sensitivity): 28 ng/L — ABNORMAL HIGH (ref <18)

## 2020-09-08 LAB — LACTIC ACID, PLASMA
Lactic Acid, Venous: 2.3 mmol/L (ref 0.5–1.9)
Lactic Acid, Venous: 1.9 mmol/L (ref 0.5–1.9)

## 2020-09-08 LAB — RESPIRATORY PANEL BY RT PCR (FLU A&B, COVID)
Influenza A by PCR: NEGATIVE
Influenza B by PCR: NEGATIVE
SARS Coronavirus 2 by RT PCR: NEGATIVE

## 2020-09-08 LAB — BRAIN NATRIURETIC PEPTIDE: B Natriuretic Peptide: 2683.5 pg/mL — ABNORMAL HIGH (ref 0.0–100.0)

## 2020-09-08 LAB — HIV ANTIBODY (ROUTINE TESTING W REFLEX): HIV Screen 4th Generation wRfx: NONREACTIVE

## 2020-09-08 MED ORDER — ASPIRIN EC 81 MG PO TBEC
81.0000 mg | DELAYED_RELEASE_TABLET | Freq: Every day | ORAL | Status: DC
  Administered 2020-09-08 – 2020-09-11 (×4): 81 mg via ORAL
  Filled 2020-09-08 (×4): qty 1

## 2020-09-08 MED ORDER — ONDANSETRON HCL 4 MG PO TABS: 4.0000 mg | ORAL_TABLET | Freq: Four times a day (QID) | ORAL | Status: DC | PRN

## 2020-09-08 MED ORDER — ATORVASTATIN CALCIUM 40 MG PO TABS
40.0000 mg | ORAL_TABLET | Freq: Every day | ORAL | Status: DC
  Administered 2020-09-08 – 2020-09-11 (×4): 40 mg via ORAL
  Filled 2020-09-08 (×4): qty 1

## 2020-09-08 MED ORDER — ENOXAPARIN SODIUM 60 MG/0.6ML ~~LOC~~ SOLN
60.0000 mg | SUBCUTANEOUS | Status: DC
  Administered 2020-09-08 – 2020-09-10 (×3): 60 mg via SUBCUTANEOUS
  Filled 2020-09-08 (×3): qty 0.6

## 2020-09-08 MED ORDER — IOHEXOL 350 MG/ML SOLN
100.0000 mL | Freq: Once | INTRAVENOUS | Status: AC | PRN
  Administered 2020-09-08: 100 mL via INTRAVENOUS

## 2020-09-08 MED ORDER — SODIUM CHLORIDE (PF) 0.9 % IJ SOLN
INTRAMUSCULAR | Status: AC
  Filled 2020-09-08: qty 50

## 2020-09-08 MED ORDER — ACETAMINOPHEN 650 MG RE SUPP: 650.0000 mg | Freq: Four times a day (QID) | RECTAL | Status: DC | PRN

## 2020-09-08 MED ORDER — FUROSEMIDE 10 MG/ML IJ SOLN
40.0000 mg | Freq: Two times a day (BID) | INTRAMUSCULAR | Status: DC
  Administered 2020-09-08 – 2020-09-11 (×6): 40 mg via INTRAVENOUS
  Filled 2020-09-08 (×6): qty 4

## 2020-09-08 MED ORDER — ONDANSETRON HCL 4 MG/2ML IJ SOLN: 4.0000 mg | Freq: Four times a day (QID) | INTRAMUSCULAR | Status: DC | PRN

## 2020-09-08 MED ORDER — ACETAMINOPHEN 325 MG PO TABS: 650.0000 mg | ORAL_TABLET | Freq: Four times a day (QID) | ORAL | Status: DC | PRN

## 2020-09-08 MED ORDER — FUROSEMIDE 10 MG/ML IJ SOLN
40.0000 mg | Freq: Once | INTRAMUSCULAR | Status: AC
  Administered 2020-09-08: 40 mg via INTRAVENOUS
  Filled 2020-09-08: qty 4

## 2020-09-08 MED ORDER — ISOSORB DINITRATE-HYDRALAZINE 20-37.5 MG PO TABS
1.0000 | ORAL_TABLET | Freq: Three times a day (TID) | ORAL | Status: DC
  Administered 2020-09-08 – 2020-09-11 (×9): 1 via ORAL
  Filled 2020-09-08 (×10): qty 1

## 2020-09-08 NOTE — H&P (Signed)
History and Physical    Gaither Biehn XHB:[id number] DOB: [birth year] DOA: 09/08/2020  PCP: Grayce Sessions, NP  Patient coming from: Home  Chief Complaint: "swelling and trouble breathing"  HPI: Dale [name] is a 59 y.o. male with medical history significant of HTN, combined systolic/diastolic HF, cocaine abuse. Presents today with c/o orthopnea and increased BLE edema. He first noticed fluid build up in the legs in August. This progressed to orthopnea. He then states movement and simple activities became more taxing (walking 10 feet). Started making trips to the ER for these symptoms (3 trips per his account in the last several weeks). On his last trip to the ED (08/27/20), he went home with demedex, spironolactone, bidil. He didn't fill these medicines til yesterday. He reports that they have not been helpful. Since his symptoms did not improve, he came to the ED.    Of note, he does endorse daily crack cocaine usage. He notes that he tried to help his situation by eating a low sodium diet. He notes that he was previous prescribe a HF regimen last year and adhered to it. He began to feel better, so he stopped that regimen because he thought he was cured.   ED Course: He was given lasix 40mg  IV x 1. CXR showed pulmonary vascular congestion. EDP talked with cardiology who agreed that pt should be admitted. [facility] was called for admision.   Review of Systems:  Reports intermittent chest pain. Denies palpitations, syncopal episodes, HA. Review of systems is otherwise negative for all not mentioned in HPI.   PMHx Past Medical History:  Diagnosis Date  . Alcohol abuse   . Chronic combined systolic and diastolic CHF (congestive heart failure) (HCC)    a. 09/27/18 showed mild LVH, EF 20-25%, grade 2 DD, mild MR, severely dilated LA, mildly dilated RV with mildly reduced RV function, mod RAE.  09/29/18 Cocaine abuse (HCC)   . Hypertension     PSHx Past Surgical History:  Procedure Laterality Date    . RIGHT/LEFT HEART CATH AND CORONARY ANGIOGRAPHY N/A 05/10/2019   Procedure: RIGHT/LEFT HEART CATH AND CORONARY ANGIOGRAPHY;  Surgeon: 07/11/2019, MD;  Location: [facility];  Service: Cardiovascular;  Laterality: N/A;    SocHx  reports that he has never smoked. He has never used smokeless tobacco. He reports current alcohol use. He reports current drug use. Frequency: 3.00 times per week. Drugs: Marijuana and Cocaine.  Allergies  Allergen Reactions  . Hydrocodone Itching    FamHx Family History  Problem Relation Age of Onset  . Hypertension Maternal Grandmother     Prior to Admission medications   Medication Sig Start Date End Date Taking? Authorizing Provider  aspirin 81 MG EC tablet Take 1 tablet (81 mg total) by mouth daily. Patient not taking: Reported on 08/28/2020 06/24/19   06/26/19, PA-C  atorvastatin (LIPITOR) 80 MG tablet Take 1 tablet (80 mg total) by mouth daily. Patient not taking: Reported on 08/28/2020 07/23/19   07/25/19 T, PA-C  carvedilol (COREG) 12.5 MG tablet Take 1 tablet (12.5 mg total) by mouth 2 (two) times daily with a meal. Patient not taking: Reported on 08/28/2020 07/23/19   07/25/19 T, PA-C  clopidogrel (PLAVIX) 75 MG tablet Take 1 tablet (75 mg total) by mouth daily. Patient not taking: Reported on 08/28/2020 07/23/19   07/25/19 T, PA-C  diclofenac sodium (VOLTAREN) 1 % GEL Apply 2 g topically 4 (four) times daily as needed (pain). Patient not taking: Reported  on 08/28/2020    [provider]  isosorbide-hydrALAZINE (BIDIL) 20-37.5 MG tablet Take 1 tablet by mouth 3 (three) times daily. Please make yearly appt with Dr. Elease Hashimoto for July for future refills. 1st attempt Patient not taking: Reported on 08/28/2020 08/24/20   Caccavale, Sophia, PA-C  pantoprazole (PROTONIX) 40 MG tablet Take 1 tablet (40 mg total) by mouth daily. Patient not taking: Reported on 08/28/2020 07/23/19   Tereso Newcomer T, PA-C  prazosin  (MINIPRESS) 1 MG capsule Take 1 capsule (1 mg total) by mouth daily. Patient not taking: Reported on 08/28/2020 07/23/19   Tereso Newcomer T, PA-C  spironolactone (ALDACTONE) 25 MG tablet Take 1 tablet (25 mg total) by mouth daily. Patient not taking: Reported on 08/28/2020 08/24/20   Caccavale, Sophia, PA-C  torsemide (DEMADEX) 20 MG tablet Take 1 tablet (20 mg total) by mouth daily. Please make overdue appt with Dr. Elease Hashimoto before anymore refills. 1st attempt Patient not taking: Reported on 08/28/2020 08/24/20   Alveria Apley, PA-C    Physical Exam: Vitals:   09/08/20 1137 09/08/20 1217 09/08/20 1230 09/08/20 1300  BP:  (!) 142/106 (!) 147/112 (!) 159/101  Pulse:  86 100 (!) 102  Resp:  (!) 29 20 (!) 21  Temp:      TempSrc:      SpO2:  97% 97% 96%  Weight: 117.9 kg     Height: 5\' 11"  (1.803 m)       General: 59 y.o. male resting in chair in NAD Eyes: PERRL, normal sclera ENMT: Nares patent w/o discharge, orophaynx clear, dentition normal, ears w/o discharge/lesions/ulcers Neck: Supple, trachea midline Cardiovascular: RRR, +S1, S2, no m/g/r, equal pulses throughout Respiratory: slightly decreased at bases, no w/r/r, somewhat increased WOB GI: BS+, NDNT, no masses noted, no organomegaly noted MSK: No c/c; BLE edema Skin: No rashes, bruises, ulcerations noted Neuro: A&O x 3, no focal deficits Psyc: Appropriate interaction and affect, calm/cooperative  Labs on Admission: I have personally reviewed following labs and imaging studies  CBC: Recent Labs  Lab 09/08/20 1135  WBC 7.2  HGB 12.1*  HCT 36.7*  MCV 90.0  PLT 298   Basic Metabolic Panel: Recent Labs  Lab 09/08/20 1135  NA 139  K 3.9  CL 108  CO2 19*  GLUCOSE 110*  BUN 36*  CREATININE 1.30*  CALCIUM 8.5*   GFR: Estimated Creatinine Clearance: 81.8 mL/min (A) (by C-G formula based on SCr of 1.3 mg/dL (H)). Liver Function Tests: No results for input(s): AST, ALT, ALKPHOS, BILITOT, PROT, ALBUMIN in the last  168 hours. No results for input(s): LIPASE, AMYLASE in the last 168 hours. No results for input(s): AMMONIA in the last 168 hours. Coagulation Profile: No results for input(s): INR, PROTIME in the last 168 hours. Cardiac Enzymes: No results for input(s): CKTOTAL, CKMB, CKMBINDEX, TROPONINI in the last 168 hours. BNP (last 3 results) No results for input(s): PROBNP in the last 8760 hours. HbA1C: No results for input(s): HGBA1C in the last 72 hours. CBG: No results for input(s): GLUCAP in the last 168 hours. Lipid Profile: No results for input(s): CHOL, HDL, LDLCALC, TRIG, CHOLHDL, LDLDIRECT in the last 72 hours. Thyroid Function Tests: No results for input(s): TSH, T4TOTAL, FREET4, T3FREE, THYROIDAB in the last 72 hours. Anemia Panel: No results for input(s): VITAMINB12, FOLATE, FERRITIN, TIBC, IRON, RETICCTPCT in the last 72 hours. Urine analysis:    Component Value Date/Time   COLORURINE COLORLESS (A) 02/28/2019 1309   APPEARANCEUR CLEAR 02/28/2019 1309   LABSPEC 1.004 (  L) 02/28/2019 1309   PHURINE 7.0 02/28/2019 1309   GLUCOSEU NEGATIVE 02/28/2019 1309   HGBUR NEGATIVE 02/28/2019 1309   BILIRUBINUR NEGATIVE 02/28/2019 1309   KETONESUR NEGATIVE 02/28/2019 1309   PROTEINUR NEGATIVE 02/28/2019 1309   UROBILINOGEN 0.2 03/11/2016 0934   NITRITE NEGATIVE 02/28/2019 1309   LEUKOCYTESUR NEGATIVE 02/28/2019 1309    Radiological Exams on Admission: DG Chest 2 View  Result Date: 09/08/2020 CLINICAL DATA:  Chest pain. EXAM: CHEST - 2 VIEW COMPARISON:  Multiple priors, most recent 08/28/2020. FINDINGS: Cardiomegaly and pulmonary vascular congestion without overt pulmonary edema. Possible small left pleural effusion with overlying atelectasis. No visible pneumothorax. No acute osseous abnormality IMPRESSION: 1. Cardiomegaly and pulmonary vascular congestion without overt pulmonary edema. 2. Possible small left pleural effusion with overlying atelectasis. Electronically Signed   By:  Feliberto Harts MD   On: 09/08/2020 11:19   CT Angio Chest PE W and/or Wo Contrast  Result Date: 09/08/2020 CLINICAL DATA:  Shortness of breath for the past 3 days. EXAM: CT ANGIOGRAPHY CHEST WITH CONTRAST TECHNIQUE: Multidetector CT imaging of the chest was performed using the standard protocol during bolus administration of intravenous contrast. Multiplanar CT image reconstructions and MIPs were obtained to evaluate the vascular anatomy. CONTRAST:  OMNIPAQUE IOHEXOL 350 MG/ML SOLN COMPARISON:  Chest radiographs obtained earlier today. Chest CT dated 02/24/2015 FINDINGS: Cardiovascular: Normally opacified pulmonary arteries with no pulmonary arterial filling defects seen. Enlarged heart with a significant interval increase in size. This is involving all 4 chambers, most prominent being the left ventricle and right atrium. Mild atheromatous aortic calcification. Mediastinum/Nodes: Mildly enlarged mediastinal nodes. These include an AP window lymph node with a short axis diameter of 22 mm on image number 46 series 4, previously 11 mm. A subcarinal node has a short axis diameter of 21 mm on image number 66, previously not well visualized. No enlarged hilar or axillary nodes. Unremarkable thyroid gland and esophagus. Lungs/Pleura: Prominent pulmonary vasculature. Small right pleural effusion. Upper Abdomen: Unremarkable. Musculoskeletal: Thoracic spine degenerative changes and mild scoliosis. Review of the MIP images confirms the above findings. IMPRESSION: 1. No pulmonary emboli. 2. Interval cardiomegaly. 3. Pulmonary vascular congestion. 4. Small right pleural effusion. 5. Mild mediastinal adenopathy, nonspecific. 6. Aortic atherosclerosis. Aortic Atherosclerosis (ICD10-I70.0). Electronically Signed   By: Beckie Salts M.D.   On: 09/08/2020 13:59    EKG: Independently reviewed. Sinus, prolonged QT  Assessment/Plan Acute on chronic combined HF     - admit to inpt, telemetry     - lasix 40mg  IV BID      - daily wts, I&O     - cards onboard, appreciate assistance     - follow renal function, looks better than last visit (closer to 2020 numbers)  HTN     - lasix IV BID     - continue bidil  CKD2     - Scr is much closer to his 2020 baseline; follow  Normocytic anemia     - no evidence of bleed, follow  NGMA     - check lactic acid  Polysubstance abuse (cocaine, mj)     - consult TOC for rehab options     - counseled on abstention from these and other drugs  CAD     - was previously on plavix/ASA; but he states he stopped taking his medicine     - will continue ASA, statin; defer to cards as to whether or not he will need DAPT     - with  all of his medicines, affordability is going to be an issue, will ask TOC if they can assist  HLD     - continue statin  DVT prophylaxis: lovenox  Code Status: FULL  Family Communication: None at bedside.  Consults called: EDP has called Cardiology  Admission status: Inpatient  Status is: Inpatient  Remains inpatient appropriate because:Inpatient level of care appropriate due to severity of illness   Dispo: The patient is from: Home              Anticipated d/c is to: Home              Anticipated d/c date is: 2 days              Patient currently is not medically stable to d/c.  Teddy Spike DO [facility]  If 7PM-7AM, please contact night-coverage [email or web address]  09/08/2020, 3:30 PM

## 2020-09-08 NOTE — ED Notes (Signed)
ED TO INPATIENT HANDOFF REPORT  Name/Age/Gender Dale [name] 59 y.o. male  Code Status Code Status History    Date Active Date Inactive Code Status Order ID Comments User Context   05/07/2019 0607 05/11/2019 1733 Full Code [id number]  Eduard Clos, MD ED   11/08/2018 0032 11/12/2018 1740 Full Code [id number]  Ike Bene, MD ED   09/27/2018 0826 09/29/2018 1036 Full Code [id number]  Darlin Drop, DO ED   Advance Care Planning Activity    Questions for Most Recent Historical Code Status (Order [id number])       Home/SNF/Other Home  Chief Complaint Acute on chronic combined systolic and diastolic HF (heart failure) (HCC) [I50.43]  Level of Care/Admitting Diagnosis ED Disposition    ED Disposition Condition Comment   Admit  Hospital Area: [facility] [[id number]]  Level of Care: Telemetry [5]  Admit to tele based on following criteria: Acute CHF  May admit patient to [name] or [facility] if equivalent level of care is available:: No  Covid Evaluation: Asymptomatic Screening Protocol (No Symptoms)  Diagnosis: Acute on chronic combined systolic and diastolic HF (heart failure) [facility]) [[id number]]  Admitting Physician: Teddy Spike [[id number]]  Attending Physician: Teddy Spike [[id number]]  Estimated length of stay: past midnight tomorrow  Certification:: I certify this patient will need inpatient services for at least 2 midnights       Medical History Past Medical History:  Diagnosis Date  . Alcohol abuse   . Chronic combined systolic and diastolic CHF (congestive heart failure) (HCC)    a. 09/27/18 showed mild LVH, EF 20-25%, grade 2 DD, mild MR, severely dilated LA, mildly dilated RV with mildly reduced RV function, mod RAE.  Marland Kitchen Cocaine abuse (HCC)   . Hypertension     Allergies Allergies  Allergen Reactions  . Hydrocodone Itching    IV Location/Drains/Wounds Patient Lines/Drains/Airways Status    Active Line/Drains/Airways    Name Placement  date Placement time Site Days   Peripheral IV 09/08/20 Right Hand 09/08/20  1134  Hand  less than 1   Peripheral IV 09/08/20 Right Antecubital 09/08/20  1326  Antecubital  less than 1          Labs/Imaging Results for orders placed or performed during the hospital encounter of 09/08/20 (from the past 48 hour(s))  Respiratory Panel by RT PCR (Flu A&B, Covid) - Nasopharyngeal Swab     Status: None   Collection Time: 09/08/20 11:26 AM   Specimen: Nasopharyngeal Swab  Result Value Ref Range   SARS Coronavirus 2 by RT PCR NEGATIVE NEGATIVE    Comment: (NOTE) SARS-CoV-2 target nucleic acids are NOT DETECTED.  The SARS-CoV-2 RNA is generally detectable in upper respiratoy specimens during the acute phase of infection. The lowest concentration of SARS-CoV-2 viral copies this assay can detect is 131 copies/mL. A negative result does not preclude SARS-Cov-2 infection and should not be used as the sole basis for treatment or other patient management decisions. A negative result may occur with  improper specimen collection/handling, submission of specimen other than nasopharyngeal swab, presence of viral mutation(s) within the areas targeted by this assay, and inadequate number of viral copies (<131 copies/mL). A negative result must be combined with clinical observations, patient history, and epidemiological information. The expected result is Negative.  Fact Sheet for Patients:  https://www.moore.com/  Fact Sheet for Healthcare Providers:  https://www.young.biz/  This test is no t yet approved or cleared by the [place] FDA and  has  been authorized for detection and/or diagnosis of SARS-CoV-2 by FDA under an Emergency Use Authorization (EUA). This EUA will remain  in effect (meaning this test can be used) for the duration of the COVID-19 declaration under Section 564(b)(1) of the Act, 21 U.S.C. section 360bbb-3(b)(1), unless the authorization  is terminated or revoked sooner.     Influenza A by PCR NEGATIVE NEGATIVE   Influenza B by PCR NEGATIVE NEGATIVE    Comment: (NOTE) The Xpert Xpress SARS-CoV-2/FLU/RSV assay is intended as an aid in  the diagnosis of influenza from Nasopharyngeal swab specimens and  should not be used as a sole basis for treatment. Nasal washings and  aspirates are unacceptable for Xpert Xpress SARS-CoV-2/FLU/RSV  testing.  Fact Sheet for Patients: https://www.moore.com/  Fact Sheet for Healthcare Providers: https://www.young.biz/  This test is not yet approved or cleared by the [place] FDA and  has been authorized for detection and/or diagnosis of SARS-CoV-2 by  FDA under an Emergency Use Authorization (EUA). This EUA will remain  in effect (meaning this test can be used) for the duration of the  Covid-19 declaration under Section 564(b)(1) of the Act, 21  U.S.C. section 360bbb-3(b)(1), unless the authorization is  terminated or revoked. Performed at [facility], [address], [place], [place] [address]   Basic metabolic panel     Status: Abnormal   Collection Time: 09/08/20 11:35 AM  Result Value Ref Range   Sodium 139 135 - 145 mmol/L   Potassium 3.9 3.5 - 5.1 mmol/L   Chloride 108 98 - 111 mmol/L   CO2 19 (L) 22 - 32 mmol/L   Glucose, Bld 110 (H) 70 - 99 mg/dL    Comment: Glucose reference range applies only to samples taken after fasting for at least 8 hours.   BUN 36 (H) 6 - 20 mg/dL   Creatinine, Ser 8.29 (H) 0.61 - 1.24 mg/dL   Calcium 8.5 (L) 8.9 - 10.3 mg/dL   GFR, Estimated >56 >21 mL/min    Comment: (NOTE) Calculated using the CKD-EPI Creatinine Equation (2021)    Anion gap 12 5 - 15    Comment: Performed at [facility], [address], [place], [place] [address]  CBC     Status: Abnormal   Collection Time: 09/08/20 11:35 AM  Result Value Ref Range   WBC 7.2 4.0 - 10.5 K/uL   RBC 4.08  (L) 4.22 - 5.81 MIL/uL   Hemoglobin 12.1 (L) 13.0 - 17.0 g/dL   HCT 78.4 (L) 39 - 52 %   MCV 90.0 80.0 - 100.0 fL   MCH 29.7 26.0 - 34.0 pg   MCHC 33.0 30.0 - 36.0 g/dL   RDW 69.6 29.5 - 28.4 %   Platelets 298 150 - 400 K/uL   nRBC 0.0 0.0 - 0.2 %    Comment: Performed at [facility], [address], [place], [place] [address]  Troponin I (High Sensitivity)     Status: Abnormal   Collection Time: 09/08/20 11:35 AM  Result Value Ref Range   Troponin I (High Sensitivity) 32 (H) <18 ng/L    Comment: (NOTE) Elevated high sensitivity troponin I (hsTnI) values and significant  changes across serial measurements may suggest ACS but many other  chronic and acute conditions are known to elevate hsTnI results.  Refer to the "Links" section for chest pain algorithms and additional  guidance. Performed at [facility], [address] [place], [place] [address]  Brain natriuretic peptide     Status: Abnormal   Collection Time: 09/08/20 11:35 AM  Result Value Ref Range   B Natriuretic Peptide 2,683.5 (H) 0.0 - 100.0 pg/mL    Comment: Performed at [facility], [address], [place], [place] [address]  Troponin I (High Sensitivity)     Status: Abnormal   Collection Time: 09/08/20  1:27 PM  Result Value Ref Range   Troponin I (High Sensitivity) 28 (H) <18 ng/L    Comment: (NOTE) Elevated high sensitivity troponin I (hsTnI) values and significant  changes across serial measurements may suggest ACS but many other  chronic and acute conditions are known to elevate hsTnI results.  Refer to the "Links" section for chest pain algorithms and additional  guidance. Performed at [facility], [address], [place], [place] [address]    DG Chest 2 View  Result Date: 09/08/2020 CLINICAL DATA:  Chest pain. EXAM: CHEST - 2 VIEW COMPARISON:  Multiple priors, most recent 08/28/2020. FINDINGS: Cardiomegaly and pulmonary  vascular congestion without overt pulmonary edema. Possible small left pleural effusion with overlying atelectasis. No visible pneumothorax. No acute osseous abnormality IMPRESSION: 1. Cardiomegaly and pulmonary vascular congestion without overt pulmonary edema. 2. Possible small left pleural effusion with overlying atelectasis. Electronically Signed   By: Feliberto Harts MD   On: 09/08/2020 11:19   CT Angio Chest PE W and/or Wo Contrast  Result Date: 09/08/2020 CLINICAL DATA:  Shortness of breath for the past 3 days. EXAM: CT ANGIOGRAPHY CHEST WITH CONTRAST TECHNIQUE: Multidetector CT imaging of the chest was performed using the standard protocol during bolus administration of intravenous contrast. Multiplanar CT image reconstructions and MIPs were obtained to evaluate the vascular anatomy. CONTRAST:  OMNIPAQUE IOHEXOL 350 MG/ML SOLN COMPARISON:  Chest radiographs obtained earlier today. Chest CT dated 02/24/2015 FINDINGS: Cardiovascular: Normally opacified pulmonary arteries with no pulmonary arterial filling defects seen. Enlarged heart with a significant interval increase in size. This is involving all 4 chambers, most prominent being the left ventricle and right atrium. Mild atheromatous aortic calcification. Mediastinum/Nodes: Mildly enlarged mediastinal nodes. These include an AP window lymph node with a short axis diameter of 22 mm on image number 46 series 4, previously 11 mm. A subcarinal node has a short axis diameter of 21 mm on image number 66, previously not well visualized. No enlarged hilar or axillary nodes. Unremarkable thyroid gland and esophagus. Lungs/Pleura: Prominent pulmonary vasculature. Small right pleural effusion. Upper Abdomen: Unremarkable. Musculoskeletal: Thoracic spine degenerative changes and mild scoliosis. Review of the MIP images confirms the above findings. IMPRESSION: 1. No pulmonary emboli. 2. Interval cardiomegaly. 3. Pulmonary vascular congestion. 4. Small right  pleural effusion. 5. Mild mediastinal adenopathy, nonspecific. 6. Aortic atherosclerosis. Aortic Atherosclerosis (ICD10-I70.0). Electronically Signed   By: Beckie Salts M.D.   On: 09/08/2020 13:59    Pending Labs Unresulted Labs (From admission, onward)         None      Vitals/Pain Today's Vitals   09/08/20 1217 09/08/20 1230 09/08/20 1300 09/08/20 1549  BP: (!) 142/106 (!) 147/112 (!) 159/101 (!) 152/111  Pulse: 86 100 (!) 102 95  Resp: (!) 29 20 (!) 21 (!) 27  Temp:    97.8 F (36.6 C)  TempSrc:    Oral  SpO2: 97% 97% 96% 98%  Weight:      Height:      PainSc:        Isolation Precautions No active isolations  Medications Medications  sodium chloride (PF) 0.9 % injection (has no administration in time range)  furosemide (LASIX) injection 40 mg (40 mg Intravenous Given 09/08/20 1211)  iohexol (OMNIPAQUE) 350 MG/ML injection 100 mL (100 mLs Intravenous Contrast Given 09/08/20 1339)    Mobility walks

## 2020-09-08 NOTE — ED Triage Notes (Addendum)
Pt presents via EMS with c/o shortness of breath. Pt has a hx of CHF. Pt reports shortness of breath x 3 days and chest pain. Pt was seen here recently, received Lasix which did help his symptoms.

## 2020-09-08 NOTE — ED Provider Notes (Signed)
[facility]-EMERGENCY DEPT Provider Note   CSN: [id number] Arrival date & time: 09/08/20  1038     History Chief Complaint  Patient presents with  . Shortness of Breath  . Chest Pain    Dale [name] is a 59 y.o. male with past medical history of CHF with EF 20 to 25% in 2018, alcohol and cocaine abuse, hypertension that presents the emergency department today for shortness of breath and chest pain.  Patient states that he started having shortness of breath 3 days ago with a cough, non productive.  Per chart review patient was seen here for the same on 10/21 and prescribed torsemide, spironolactone and isosorbide-hydralazine, then came back to the emergency department 4 days later for the same.  At that time medication compliance was an issue, did get IV Lasix and diuresed about 2 L.  Appeared to be better after this.  Patient states that he has been compliant with his medications after discharge from the emergency department on his last visit.  States that he has been gaining weight for the past 3 to 4 days.  Admits to eating slightly salty foods.  States that his Lasix has not been working.  Cardiologist is Dr. Elease Hashimoto.  Denies any fevers, chills, nausea, vomiting, abdominal pain, back pain.  Denies any sick contacts.  States that he has had 1 dose of his Covid vaccine, Pfizer along time ago however did not get the second dose due to side effects.  Patient also states that he has intermittent chest pain, last episode 3 hours ago.  States that it is pleuritic.  Does not radiate anywhere, is not worse upon exertion.  Patient is orthopneic.    HPI     Past Medical History:  Diagnosis Date  . Alcohol abuse   . Chronic combined systolic and diastolic CHF (congestive heart failure) (HCC)    a. 09/27/18 showed mild LVH, EF 20-25%, grade 2 DD, mild MR, severely dilated LA, mildly dilated RV with mildly reduced RV function, mod RAE.  Marland Kitchen Cocaine abuse (HCC)   . Hypertension      Patient Active Problem List   Diagnosis Date Noted  . 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 Acute on chronic combined systolic (congestive) and diastolic (congestive) heart failure (HCC)   . Acute CHF (congestive heart failure) (HCC) 05/07/2019  . Nausea & vomiting 05/07/2019  . Acute exacerbation of CHF (congestive heart failure) (HCC) 03/11/2019  . Troponin level elevated 03/11/2019  . Hyperlipidemia 03/03/2019  . NSVT (nonsustained ventricular tachycardia) (HCC) 03/03/2019  . Anxiety and depression   . NSTEMI (non-ST elevated myocardial infarction) (HCC) 02/28/2019  . Acute systolic heart failure (HCC) 11/08/2018  . Hypertensive urgency 09/28/2018  . Acute pulmonary edema (HCC) 09/28/2018  . Cocaine abuse (HCC) 09/28/2018  . Substance abuse (HCC) 09/28/2018  . Chronic kidney disease, stage II (mild) 09/28/2018  . Normochromic anemia 09/28/2018  . Acute systolic CHF (congestive heart failure) (HCC) 09/27/2018  . Essential hypertension 03/11/2016  . Obesity 03/11/2016  . Glucosuria 03/11/2016    Past Surgical History:  Procedure Laterality Date  . RIGHT/LEFT HEART CATH AND CORONARY ANGIOGRAPHY N/A 05/10/2019   Procedure: RIGHT/LEFT HEART CATH AND CORONARY ANGIOGRAPHY;  Surgeon: Yvonne Kendall, MD;  Location: [facility] INVASIVE CV LAB;  Service: Cardiovascular;  Laterality: N/A;       Family History  Problem Relation Age of Onset  . Hypertension Maternal Grandmother     Social History  Tobacco Use  . Smoking status: Never Smoker  . Smokeless tobacco: Never Used  Vaping Use  . Vaping Use: Never used  Substance Use Topics  . Alcohol use: Yes    Comment: 2 quarts a week   . Drug use: Yes    Frequency: 3.0 times per week    Types: Marijuana, Cocaine    Home Medications Prior to Admission medications   Medication Sig Start Date End Date Taking? Authorizing Provider  aspirin 81 MG EC tablet Take 1 tablet (81 mg  total) by mouth daily. Patient not taking: Reported on 08/28/2020 06/24/19   Charlestine Night, PA-C  atorvastatin (LIPITOR) 80 MG tablet Take 1 tablet (80 mg total) by mouth daily. Patient not taking: Reported on 08/28/2020 07/23/19   Tereso Newcomer T, PA-C  carvedilol (COREG) 12.5 MG tablet Take 1 tablet (12.5 mg total) by mouth 2 (two) times daily with a meal. Patient not taking: Reported on 08/28/2020 07/23/19   Tereso Newcomer T, PA-C  clopidogrel (PLAVIX) 75 MG tablet Take 1 tablet (75 mg total) by mouth daily. Patient not taking: Reported on 08/28/2020 07/23/19   Tereso Newcomer T, PA-C  diclofenac sodium (VOLTAREN) 1 % GEL Apply 2 g topically 4 (four) times daily as needed (pain). Patient not taking: Reported on 08/28/2020    [provider]  isosorbide-hydrALAZINE (BIDIL) 20-37.5 MG tablet Take 1 tablet by mouth 3 (three) times daily. Please make yearly appt with Dr. Elease Hashimoto for July for future refills. 1st attempt Patient not taking: Reported on 08/28/2020 08/24/20   Caccavale, Sophia, PA-C  pantoprazole (PROTONIX) 40 MG tablet Take 1 tablet (40 mg total) by mouth daily. Patient not taking: Reported on 08/28/2020 07/23/19   Tereso Newcomer T, PA-C  prazosin (MINIPRESS) 1 MG capsule Take 1 capsule (1 mg total) by mouth daily. Patient not taking: Reported on 08/28/2020 07/23/19   Tereso Newcomer T, PA-C  spironolactone (ALDACTONE) 25 MG tablet Take 1 tablet (25 mg total) by mouth daily. Patient not taking: Reported on 08/28/2020 08/24/20   Caccavale, Sophia, PA-C  torsemide (DEMADEX) 20 MG tablet Take 1 tablet (20 mg total) by mouth daily. Please make overdue appt with Dr. Elease Hashimoto before anymore refills. 1st attempt Patient not taking: Reported on 08/28/2020 08/24/20   Caccavale, Sophia, PA-C    Allergies    Hydrocodone  Review of Systems   Review of Systems  Constitutional: Negative for chills, diaphoresis, fatigue and fever.  HENT: Negative for congestion, sore throat and trouble  swallowing.   Eyes: Negative for pain and visual disturbance.  Respiratory: Negative for cough, shortness of breath and wheezing.   Cardiovascular: Positive for chest pain. Negative for palpitations and leg swelling.  Gastrointestinal: Negative for abdominal distention, abdominal pain, diarrhea, nausea and vomiting.  Genitourinary: Negative for difficulty urinating.  Musculoskeletal: Negative for back pain, neck pain and neck stiffness.  Skin: Negative for pallor.  Neurological: Negative for dizziness, speech difficulty, weakness and headaches.  Psychiatric/Behavioral: Negative for confusion.    Physical Exam Updated Vital Signs BP (!) 159/101   Pulse (!) 102   Temp (!) 97.5 F (36.4 C) (Oral)   Resp (!) 21   Ht 5\' 11"  (1.803 m)   Wt 117.9 kg   SpO2 96%   BMI 36.26 kg/m   Physical Exam Constitutional:      General: He is not in acute distress.    Appearance: Normal appearance. He is not ill-appearing, toxic-appearing or diaphoretic.  HENT:     Mouth/Throat:  Mouth: Mucous membranes are moist.     Pharynx: Oropharynx is clear.  Eyes:     General: No scleral icterus.    Extraocular Movements: Extraocular movements intact.     Pupils: Pupils are equal, round, and reactive to light.  Cardiovascular:     Rate and Rhythm: Regular rhythm. Tachycardia present.     Pulses: Normal pulses.     Heart sounds: Normal heart sounds.  Pulmonary:     Effort: Pulmonary effort is normal. Tachypnea present. No respiratory distress.     Breath sounds: No stridor. Rhonchi present. No wheezing or rales.  Chest:     Chest wall: No tenderness.  Abdominal:     General: Abdomen is flat. There is no distension.     Palpations: Abdomen is soft.     Tenderness: There is no abdominal tenderness. There is no guarding or rebound.  Musculoskeletal:        General: No swelling or tenderness. Normal range of motion.     Cervical back: Normal range of motion and neck supple. No rigidity.     Right  lower leg: Edema (2+, no signs of infection, PT 2+) present.     Left lower leg: Edema (Same as right) present.  Skin:    General: Skin is warm and dry.     Capillary Refill: Capillary refill takes less than 2 seconds.     Coloration: Skin is not pale.  Neurological:     General: No focal deficit present.     Mental Status: He is alert and oriented to person, place, and time.  Psychiatric:        Mood and Affect: Mood normal.        Behavior: Behavior normal.     ED Results / Procedures / Treatments   Labs (all labs ordered are listed, but only abnormal results are displayed) Labs Reviewed  BASIC METABOLIC PANEL - Abnormal; Notable for the following components:      Result Value   CO2 19 (*)    Glucose, Bld 110 (*)    BUN 36 (*)    Creatinine, Ser 1.30 (*)    Calcium 8.5 (*)    All other components within normal limits  CBC - Abnormal; Notable for the following components:   RBC 4.08 (*)    Hemoglobin 12.1 (*)    HCT 36.7 (*)    All other components within normal limits  BRAIN NATRIURETIC PEPTIDE - Abnormal; Notable for the following components:   B Natriuretic Peptide 2,683.5 (*)    All other components within normal limits  TROPONIN I (HIGH SENSITIVITY) - Abnormal; Notable for the following components:   Troponin I (High Sensitivity) 32 (*)    All other components within normal limits  TROPONIN I (HIGH SENSITIVITY) - Abnormal; Notable for the following components:   Troponin I (High Sensitivity) 28 (*)    All other components within normal limits  RESPIRATORY PANEL BY RT PCR (FLU A&B, COVID)    EKG EKG Interpretation  Date/Time:  Friday September 08 2020 10:53:37 EDT Ventricular Rate:  91 PR Interval:    QRS Duration: 106 QT Interval:  415 QTC Calculation: 511 R Axis:   55 Text Interpretation: Sinus rhythm Borderline prolonged PR interval LAE, consider biatrial enlargement Nonspecific T abnormalities, lateral leads Prolonged QT interval 12 Lead; Mason-Likar No  STEMI Confirmed by Alvester Chou [phone]) on 09/08/2020 12:00:32 PM   Radiology DG Chest 2 View  Result Date: 09/08/2020 CLINICAL DATA:  Chest pain.  EXAM: CHEST - 2 VIEW COMPARISON:  Multiple priors, most recent 08/28/2020. FINDINGS: Cardiomegaly and pulmonary vascular congestion without overt pulmonary edema. Possible small left pleural effusion with overlying atelectasis. No visible pneumothorax. No acute osseous abnormality IMPRESSION: 1. Cardiomegaly and pulmonary vascular congestion without overt pulmonary edema. 2. Possible small left pleural effusion with overlying atelectasis. Electronically Signed   By: Feliberto Harts MD   On: 09/08/2020 11:19   CT Angio Chest PE W and/or Wo Contrast  Result Date: 09/08/2020 CLINICAL DATA:  Shortness of breath for the past 3 days. EXAM: CT ANGIOGRAPHY CHEST WITH CONTRAST TECHNIQUE: Multidetector CT imaging of the chest was performed using the standard protocol during bolus administration of intravenous contrast. Multiplanar CT image reconstructions and MIPs were obtained to evaluate the vascular anatomy. CONTRAST:  OMNIPAQUE IOHEXOL 350 MG/ML SOLN COMPARISON:  Chest radiographs obtained earlier today. Chest CT dated 02/24/2015 FINDINGS: Cardiovascular: Normally opacified pulmonary arteries with no pulmonary arterial filling defects seen. Enlarged heart with a significant interval increase in size. This is involving all 4 chambers, most prominent being the left ventricle and right atrium. Mild atheromatous aortic calcification. Mediastinum/Nodes: Mildly enlarged mediastinal nodes. These include an AP window lymph node with a short axis diameter of 22 mm on image number 46 series 4, previously 11 mm. A subcarinal node has a short axis diameter of 21 mm on image number 66, previously not well visualized. No enlarged hilar or axillary nodes. Unremarkable thyroid gland and esophagus. Lungs/Pleura: Prominent pulmonary vasculature. Small right pleural effusion.  Upper Abdomen: Unremarkable. Musculoskeletal: Thoracic spine degenerative changes and mild scoliosis. Review of the MIP images confirms the above findings. IMPRESSION: 1. No pulmonary emboli. 2. Interval cardiomegaly. 3. Pulmonary vascular congestion. 4. Small right pleural effusion. 5. Mild mediastinal adenopathy, nonspecific. 6. Aortic atherosclerosis. Aortic Atherosclerosis (ICD10-I70.0). Electronically Signed   By: Beckie Salts M.D.   On: 09/08/2020 13:59    Procedures Procedures (including critical care time)  Medications Ordered in ED Medications  sodium chloride (PF) 0.9 % injection (has no administration in time range)  furosemide (LASIX) injection 40 mg (40 mg Intravenous Given 09/08/20 1211)  iohexol (OMNIPAQUE) 350 MG/ML injection 100 mL (100 mLs Intravenous Contrast Given 09/08/20 1339)    ED Course  I have reviewed the triage vital signs and the nursing notes.  Pertinent labs & imaging results that were available during my care of the patient were reviewed by me and considered in my medical decision making (see chart for details).  Clinical Course as of Sep 08 1529  Fri Sep 08, 2020  1310 59 yo male w/ significant CHF (ef 20-25%) presenting to ED with dyspnea at home.  Seen in ED 3 times in the past month for suspected CHF exacerbation, improved with IV lasix in the ED each time, but his home does "doesn't make me pee at all."  Here he is tachycardic and tachypneic,  RR 40 in the room, no hypoxia (98% on room air), BP hypertensive but stable.  BNP 2683 - always elevated.  Plan on CT PE, and at this point I anticipate admission for cards c/s and echocardiogram.     [MT]  1403 IMPRESSION: 1. No pulmonary emboli. 2. Interval cardiomegaly. 3. Pulmonary vascular congestion. 4. Small right pleural effusion. 5. Mild mediastinal adenopathy, nonspecific. 6. Aortic atherosclerosis.   [MT]  1431 Delta trop flat 32 -> 28.  Anticipate admission for CHF exacerbation and evaluation   [MT]      Clinical Course User Index [MT] Trifan,  Kermit Balo, MD   MDM Rules/Calculators/A&P                         Levelle Balsamo is a 59 y.o. male with past medical history of CHF with EF 20 to 25% in 2018, alcohol and cocaine abuse, hypertension that presents the emergency department today for shortness of breath and chest pain.  Patient appears fluid overloaded, will treat like CHF exacerbation and order basic labs at this time.  Differential to include CHF exacerbation, ACS, PE, pneumonia, COPD.  IV Lasix given initially.  Work-up today remarkable for hemoglobin of 12.1, this does appear stable.  BMP with creatinine of 1.3, which appears better than patient's baseline.  BNP increased to 2683 today.  Troponins 32, repeat 28.  These do appear to be patient's baseline from a year ago.  Covid test negative.  CT PE study negative.  EKG interpreted by me without any signs of ischemia or arrhythmia.  Upon reevaluation patient does appear slightly better with IV Lasix.  Will speak to cardiology at this time, I think patient would benefit from admission since this is patient's third visit and he has not been benefiting from his home regimen of Lasix.  300 spoke to Dr.Chandrasekhar, [facility] who recommends inpatient management at this time, they will consult.  Do not recommend transferring over to [facility] at this time, will consult hospitalist.  323 spoke to Dr. Ronaldo Miyamoto, hospitalist who accept the patient.  The patient appears reasonably stabilized for admission considering the current resources, flow, and capabilities available in the ED at this time, and I doubt any other [facility] requiring further screening and/or treatment in the ED prior to admission.  I discussed this case with my attending physician who cosigned this note including patient's presenting symptoms, physical exam, and planned diagnostics and interventions. Attending physician stated agreement with plan or made changes to plan which were implemented.    Attending physician assessed patient at bedside.  Final Clinical Impression(s) / ED Diagnoses Final diagnoses:  Acute on chronic congestive heart failure, unspecified heart failure type [facility])    Rx / DC Orders ED Discharge Orders    None       Farrel Gordon, PA-C 09/08/20 1532    Terald Sleeper, MD 09/09/20 [phone]

## 2020-09-08 NOTE — Consult Note (Signed)
Cardiology Consultation:   Patient ID: Dale [name] MRN: [id number]; DOB: 10/03/1961  Admit date: 09/08/2020 Date of Consult: 09/08/2020  Primary Care Provider: Grayce Sessions, NP [facility] Cardiologist: Dale Miss, MD  [facility] Electrophysiologist:  None    Patient Profile:   Dale [name] is a 59 y.o. male with a PMH of CAD, chronic combined CHF, HTN, HLD, and cocaine abuse, who is being seen today for the evaluation of CHF at the request of Dr. Ronaldo [name].  History of Present Illness:   Dale [name] presented to the ED 11.5.21 with complaints of SOB and LE edema. This has been going on since August, however progressively worsening. He presented a couple times to the ED for the same complaints, given IV lasix, and was discharged from the ED on torsemide, spironolactone, and bidil, though did not fill these medications until yesterday, reporting they have not been helpful. Given progressive symptoms, he presented again for evaluation of his CHF.   He was last evaluated by cardiology via a telemedicine visit with Dale Newcomer, PA-C 05/2019 for post-hospital follow-up. He recently underwent a R/LHC earlier that month which showed CTA of RCA with L>R collaterals, otherwise non-obstructive CAD which was medically managed. Echo 03/2019 showed EF 20-25%, mild LVH, G3DD, inferior hypokinesis, and mild RAE. He was previously on aspirin, plavix, metoprolol, bidil, spironolactone, and statin for management of his CHF/CAD, however he was lost to follow-up and non-compliance has been an issue over the past year.  On arrival to the ED he was hypertensive, otherwise VSS. Labs notable for Cr 1.3 (appears to be baseline), BNP 2600s, HsTrop 32>28. CXR showed pulmonary vascular congestion without overt edema. CTA Chest was without PE, though pulmonary vascular congestion again noted with small right pleural effusion. He was started on IV lasix  BID with UOP -1.8L. Home spironolactone held to allow  room for diuresis. Cardiology asked to evaluate for CHF.  He denies any chest pain.  Reports dyspnea improving with diuresis.   Past Medical History:  Diagnosis Date  . Alcohol abuse   . Chronic combined systolic and diastolic CHF (congestive heart failure) (HCC)    a. 09/27/18 showed mild LVH, EF 20-25%, grade 2 DD, mild MR, severely dilated LA, mildly dilated RV with mildly reduced RV function, mod RAE.  Marland Kitchen Cocaine abuse (HCC)   . Hypertension     Past Surgical History:  Procedure Laterality Date  . RIGHT/LEFT HEART CATH AND CORONARY ANGIOGRAPHY N/A 05/10/2019   Procedure: RIGHT/LEFT HEART CATH AND CORONARY ANGIOGRAPHY;  Surgeon: Dale Kendall, MD;  Location: [facility]VASIVE [facility];  Service: Cardiovascular;  Laterality: N/A;     Home Medications:  Prior to Admission medications   Medication Sig Start Date End Date Taking? Authorizing Provider  aspirin 81 MG EC tablet Take 1 tablet (81 mg total) by mouth daily. Patient not taking: Reported on 08/28/2020 06/24/19   Charlestine Night, PA-C  atorvastatin (LIPITOR) 80 MG tablet Take 1 tablet (80 mg total) by mouth daily. Patient not taking: Reported on 08/28/2020 07/23/19   Dale [name] T, PA-C  carvedilol (COREG) 12.5 MG tablet Take 1 tablet (12.5 mg total) by mouth 2 (two) times daily with a meal. Patient not taking: Reported on 08/28/2020 07/23/19   Dale [name] T, PA-C  clopidogrel (PLAVIX) 75 MG tablet Take 1 tablet (75 mg total) by mouth daily. Patient not taking: Reported on 08/28/2020 07/23/19   Dale [name] T, PA-C  diclofenac sodium (VOLTAREN) 1 % GEL Apply 2 g topically 4 (four)  times daily as needed (pain). Patient not taking: Reported on 08/28/2020    [provider]  isosorbide-hydrALAZINE (BIDIL) 20-37.5 MG tablet Take 1 tablet by mouth 3 (three) times daily. Please make yearly appt with Dr. Elease Hashimoto for July for future refills. 1st attempt Patient not taking: Reported on 08/28/2020 08/24/20   Caccavale, Sophia, PA-C    pantoprazole (PROTONIX) 40 MG tablet Take 1 tablet (40 mg total) by mouth daily. Patient not taking: Reported on 08/28/2020 07/23/19   Dale [name] T, PA-C  prazosin (MINIPRESS) 1 MG capsule Take 1 capsule (1 mg total) by mouth daily. Patient not taking: Reported on 08/28/2020 07/23/19   Dale [name] T, PA-C  spironolactone (ALDACTONE) 25 MG tablet Take 1 tablet (25 mg total) by mouth daily. Patient not taking: Reported on 08/28/2020 08/24/20   Caccavale, Sophia, PA-C  torsemide (DEMADEX) 20 MG tablet Take 1 tablet (20 mg total) by mouth daily. Please make overdue appt with Dr. Elease Hashimoto before anymore refills. 1st attempt Patient not taking: Reported on 08/28/2020 08/24/20   Alveria Apley, PA-C    Inpatient Medications: Scheduled Meds: . aspirin EC  81 mg Oral Daily  . atorvastatin  40 mg Oral Daily  . enoxaparin (LOVENOX) injection  60 mg Subcutaneous Q24H  . furosemide  40 mg Intravenous BID  . isosorbide-hydrALAZINE  1 tablet Oral TID  . sodium chloride (PF)       Continuous Infusions:  PRN Meds: acetaminophen **OR** acetaminophen, ondansetron **OR** ondansetron (ZOFRAN) IV  Allergies:    Allergies  Allergen Reactions  . Hydrocodone Itching    Social History:   Social History   Socioeconomic History  . Marital status: Single    Spouse name: Not on file  . Number of children: Not on file  . Years of education: Not on file  . Highest education level: Not on file  Occupational History  . Not on file  Tobacco Use  . Smoking status: Never Smoker  . Smokeless tobacco: Never Used  Vaping Use  . Vaping Use: Never used  Substance and Sexual Activity  . Alcohol use: Yes    Comment: 2 quarts a week   . Drug use: Yes    Frequency: 3.0 times per week    Types: Marijuana, Cocaine  . Sexual activity: Not Currently  Other Topics Concern  . Not on file  Social History Narrative  . Not on file   Social Determinants of Health   Financial Resource Strain:   .  Difficulty of Paying Living Expenses: Not on file  Food Insecurity:   . Worried About Programme researcher, broadcasting/film/video in the Last Year: Not on file  . Ran Out of Food in the Last Year: Not on file  Transportation Needs:   . Lack of Transportation (Medical): Not on file  . Lack of Transportation (Non-Medical): Not on file  Physical Activity:   . Days of Exercise per Week: Not on file  . Minutes of Exercise per Session: Not on file  Stress:   . Feeling of Stress : Not on file  Social Connections:   . Frequency of Communication with Friends and Family: Not on file  . Frequency of Social Gatherings with Friends and Family: Not on file  . Attends Religious Services: Not on file  . Active Member of Clubs or Organizations: Not on file  . Attends Banker Meetings: Not on file  . Marital Status: Not on file  Intimate Partner Violence:   . Fear of Current  or Ex-Partner: Not on file  . Emotionally Abused: Not on file  . Physically Abused: Not on file  . Sexually Abused: Not on file    Family History:    Family History  Problem Relation Age of Onset  . Hypertension Maternal Grandmother      ROS:  Please see the history of present illness.   All other ROS reviewed and negative.     Physical Exam/Data:   Vitals:   09/08/20 1230 09/08/20 1300 09/08/20 1549 09/08/20 1644  BP: (!) 147/112 (!) 159/101 (!) 152/111 (!) 157/116  Pulse: 100 (!) 102 95 88  Resp: 20 (!) 21 (!) 27 (!) 25  Temp:   97.8 F (36.6 C) 98.8 F (37.1 C)  TempSrc:   Oral Oral  SpO2: 97% 96% 98% 97%  Weight:      Height:        Intake/Output Summary (Last 24 hours) at 09/08/2020 1904 Last data filed at 09/08/2020 1900 Gross per 24 hour  Intake 480 ml  Output 2050 ml  Net -1570 ml   Last 3 Weights 09/08/2020 06/24/2019 05/26/2019  Weight (lbs) 260 lb 232 lb 238 lb  Weight (kg) 117.935 kg 105.235 kg 107.956 kg     Body mass index is 36.26 kg/m.  General: in no acute distress HEENT: normal Neck: +  JVD Cardiac:  normal S1, S2; RRR; no murmur Lungs:  Bibasilar crackles Abd: soft, nontender Ext: 1+ edema Musculoskeletal:  No deformities Skin: warm and dry  Neuro:  no focal abnormalities noted Psych:  Normal affect   EKG:  The EKG was personally reviewed and demonstrates:  Sinus rhythm, rate 91 bpm, non-specific T wave abnormalities, QTc 511, poor R wave progression, no STE/D, no significant change from previous.  Telemetry:  Telemetry was personally reviewed and demonstrates:  NSR 90s, PVCs  Relevant CV Studies: Echocardiogram 03/2019:  1. The left ventricle has severely reduced systolic function, with an  ejection fraction of 20-25%. The cavity size was moderately dilated. There  is mildly increased left ventricular wall thickness. Left ventricular  diastolic Doppler parameters are  consistent with restrictive filling. Elevated left ventricular  end-diastolic pressure.  2. Diffuse hypokinesis worse in the inferior wall.  3. The right ventricle has normal systolic function. The cavity was  moderately enlarged. There is no increase in right ventricular wall  thickness.  4. Right atrial size was mildly dilated.  5. Mild thickening of the mitral valve leaflet.  6. The aortic valve is tricuspid. Mild thickening of the aortic valve.  Mild calcification of the aortic valve.   Right/Left heart catheterization 05/2019: Conclusions: 1. Severe single-vessel coronary artery disease with chronic total occlusion of the proximal RCA. 2. Mild to moderate, non-obstructive coronary artery disease involving the LAD and LCx. 3. Moderately elevated left heart filling pressures. 4. Severely elevated right heart filling pressures. 5. Mild pulmonary hypertension. 6. Normal Fick cardiac output/index.  Recommendations: 1. 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  Patient will be transported back to [facility] after recovery from today's  procedure. 2. Continue diuresis and optimization of evidence-based heart failure therapy.  I will restart furosemide 40 mg IV BID this afternoon. 3. Secondary prevention of coronary artery disease, including high-intensity statin therapy and avoidance of cocaine.    Laboratory Data:  High Sensitivity Troponin:   Recent Labs  Lab 09/08/20 1135 09/08/20 1327  TROPONINIHS 32* 28*     Chemistry Recent Labs  Lab 09/08/20 1135 09/08/20 1812  NA 139  --  K 3.9  --   CL 108  --   CO2 19*  --   GLUCOSE 110*  --   BUN 36*  --   CREATININE 1.30* 1.47*  CALCIUM 8.5*  --   GFRNONAA >60 55*  ANIONGAP 12  --     No results for input(s): PROT, ALBUMIN, AST, ALT, ALKPHOS, BILITOT in the last 168 hours. Hematology Recent Labs  Lab 09/08/20 1135 09/08/20 1812  WBC 7.2 6.2  RBC 4.08* 4.13*  HGB 12.1* 12.1*  HCT 36.7* 39.0  MCV 90.0 94.4  MCH 29.7 29.3  MCHC 33.0 31.0  RDW 15.2 15.4  PLT 298 282   BNP Recent Labs  Lab 09/08/20 1135  BNP 2,683.5*    DDimer No results for input(s): DDIMER in the last 168 hours.   Radiology/Studies:  DG Chest 2 View  Result Date: 09/08/2020 CLINICAL DATA:  Chest pain. EXAM: CHEST - 2 VIEW COMPARISON:  Multiple priors, most recent 08/28/2020. FINDINGS: Cardiomegaly and pulmonary vascular congestion without overt pulmonary edema. Possible small left pleural effusion with overlying atelectasis. No visible pneumothorax. No acute osseous abnormality IMPRESSION: 1. Cardiomegaly and pulmonary vascular congestion without overt pulmonary edema. 2. Possible small left pleural effusion with overlying atelectasis. Electronically Signed   By: Feliberto Harts MD   On: 09/08/2020 11:19   CT Angio Chest PE W and/or Wo Contrast  Result Date: 09/08/2020 CLINICAL DATA:  Shortness of breath for the past 3 days. EXAM: CT ANGIOGRAPHY CHEST WITH CONTRAST TECHNIQUE: Multidetector CT imaging of the chest was performed using the standard protocol during bolus  administration of intravenous contrast. Multiplanar CT image reconstructions and MIPs were obtained to evaluate the vascular anatomy. CONTRAST:  OMNIPAQUE IOHEXOL 350 MG/ML SOLN COMPARISON:  Chest radiographs obtained earlier today. Chest CT dated 02/24/2015 FINDINGS: Cardiovascular: Normally opacified pulmonary arteries with no pulmonary arterial filling defects seen. Enlarged heart with a significant interval increase in size. This is involving all 4 chambers, most prominent being the left ventricle and right atrium. Mild atheromatous aortic calcification. Mediastinum/Nodes: Mildly enlarged mediastinal nodes. These include an AP window lymph node with a short axis diameter of 22 mm on image number 46 series 4, previously 11 mm. A subcarinal node has a short axis diameter of 21 mm on image number 66, previously not well visualized. No enlarged hilar or axillary nodes. Unremarkable thyroid gland and esophagus. Lungs/Pleura: Prominent pulmonary vasculature. Small right pleural effusion. Upper Abdomen: Unremarkable. Musculoskeletal: Thoracic spine degenerative changes and mild scoliosis. Review of the MIP images confirms the above findings. IMPRESSION: 1. No pulmonary emboli. 2. Interval cardiomegaly. 3. Pulmonary vascular congestion. 4. Small right pleural effusion. 5. Mild mediastinal adenopathy, nonspecific. 6. Aortic atherosclerosis. Aortic Atherosclerosis (ICD10-I70.0). Electronically Signed   By: Beckie Salts M.D.   On: 09/08/2020 13:59     Assessment and Plan:   1. Acute on chronic combined CHF: patient presented with worsening SOB and LE edema in the setting of medication non-compliance. BNP elevated to 2600s. CXR/CTA Chest with pulmonary vascular congestion. He was started on IV lasix 40mg  BID with good UOP, -1.8L so far. GDMT has been limited by daily cocaine use, CKD, and non-compliance.  - Agree with repeat echo to re-evaluate LV function, previously EF 20-25% 03/2019 - Continue IV lasix 40 mg  BID - Continue bidil 20-37.5 mg TID - Can restart spironolactone tomorrow if Cr remains stable with diuresis  2. CAD: CTO of RCA with L>R collaterals and otherwise non-obstructive disease (medically managed). No chest pain.  -  Continue aspirin  - Restart statin  3. HTN: BP elevated this admission - Monitor response to reintroduction to bidil  - Can restart spironolactone if Cr remains stable  4. HLD: no recent lipid panel - Will recheck FLP in AM - Would resume statin  5. CKD stage 2: Cr appears to be about baseline at 1.3 today - Continue to monitor closely with diuresis  6. Polysubstance abuse: ongoing daily cocaine use and marijuana use - Continue to encourage abstinence   For questions or updates, please contact [facility] Please consult [email or web address] for contact info under    Signed, Little Ishikawa, MD  09/08/2020 7:04 PM   Judy Pimple, PA

## 2020-09-08 NOTE — Progress Notes (Signed)
Pt had critical lab Lactic acid 2.3 Provider made aware

## 2020-09-09 ENCOUNTER — Inpatient Hospital Stay (HOSPITAL_COMMUNITY): Payer: Medicaid Other

## 2020-09-09 DIAGNOSIS — I5043 Acute on chronic combined systolic (congestive) and diastolic (congestive) heart failure: Secondary | ICD-10-CM | POA: Diagnosis not present

## 2020-09-09 DIAGNOSIS — I5021 Acute systolic (congestive) heart failure: Secondary | ICD-10-CM

## 2020-09-09 DIAGNOSIS — F1910 Other psychoactive substance abuse, uncomplicated: Secondary | ICD-10-CM | POA: Diagnosis not present

## 2020-09-09 DIAGNOSIS — I10 Essential (primary) hypertension: Secondary | ICD-10-CM | POA: Diagnosis not present

## 2020-09-09 LAB — COMPREHENSIVE METABOLIC PANEL
ALT: 45 U/L — ABNORMAL HIGH (ref 0–44)
AST: 33 U/L (ref 15–41)
Albumin: 3.1 g/dL — ABNORMAL LOW (ref 3.5–5.0)
Alkaline Phosphatase: 56 U/L (ref 38–126)
Anion gap: 9 (ref 5–15)
BUN: 31 mg/dL — ABNORMAL HIGH (ref 6–20)
CO2: 23 mmol/L (ref 22–32)
Calcium: 8.3 mg/dL — ABNORMAL LOW (ref 8.9–10.3)
Chloride: 105 mmol/L (ref 98–111)
Creatinine, Ser: 1.43 mg/dL — ABNORMAL HIGH (ref 0.61–1.24)
GFR, Estimated: 57 mL/min — ABNORMAL LOW (ref >60)
Glucose, Bld: 98 mg/dL (ref 70–99)
Potassium: 3.5 mmol/L (ref 3.5–5.1)
Sodium: 137 mmol/L (ref 135–145)
Total Bilirubin: 1.3 mg/dL — ABNORMAL HIGH (ref 0.3–1.2)
Total Protein: 5.7 g/dL — ABNORMAL LOW (ref 6.5–8.1)

## 2020-09-09 LAB — ECHOCARDIOGRAM COMPLETE
Area-P 1/2: 7.82 cm2
Calc EF: 22.5 %
Height: 71 in
S' Lateral: 6.5 cm
Single Plane A2C EF: 25.9 %
Single Plane A4C EF: 17.3 %
Weight: 4356.29 oz

## 2020-09-09 LAB — CBC
HCT: 34.2 % — ABNORMAL LOW (ref 39.0–52.0)
Hemoglobin: 11.2 g/dL — ABNORMAL LOW (ref 13.0–17.0)
MCH: 29.6 pg (ref 26.0–34.0)
MCHC: 32.7 g/dL (ref 30.0–36.0)
MCV: 90.2 fL (ref 80.0–100.0)
Platelets: 252 10*3/uL (ref 150–400)
RBC: 3.79 MIL/uL — ABNORMAL LOW (ref 4.22–5.81)
RDW: 15.2 % (ref 11.5–15.5)
WBC: 5.7 10*3/uL (ref 4.0–10.5)
nRBC: 0 % (ref 0.0–0.2)

## 2020-09-09 LAB — LIPID PANEL
Cholesterol: 164 mg/dL (ref 0–200)
HDL: 45 mg/dL (ref >40)
LDL Cholesterol: 111 mg/dL — ABNORMAL HIGH (ref 0–99)
Total CHOL/HDL Ratio: 3.6 RATIO
Triglycerides: 38 mg/dL (ref <150)
VLDL: 8 mg/dL (ref 0–40)

## 2020-09-09 MED ORDER — PERFLUTREN LIPID MICROSPHERE
1.0000 mL | INTRAVENOUS | Status: AC | PRN
  Administered 2020-09-09: 2 mL via INTRAVENOUS
  Filled 2020-09-09: qty 10

## 2020-09-09 NOTE — Progress Notes (Signed)
Patient ID: Dale [name], male   DOB: January 07, 1961, 59 y.o.   MRN: [id number]  PROGRESS NOTE    Dale [name]  YQI:[id number] DOB: 12-04-1960 DOA: 09/08/2020 PCP: Grayce Sessions, NP   Brief Narrative:  59 year old male with history of hypertension, chronic systolic/diastolic heart failure, cocaine abuse, noncompliance presented with dyspnea and bilateral lower extremity edema.  On presentation, chest x-ray showed pulmonary vascular congestion.  He was started on IV Lasix and cardiology was consulted.  Assessment & Plan:   Acute on chronic combined CHF Noncompliance to medications -Currently on IV Lasix and diuresing well.  Cardiology following.  Continue isosorbide and hydralazine.  Strict input and output.  Daily weights.  Fluid restriction. -Repeat a.m. labs. -Currently still volume overloaded and needs IV Lasix  Hypertension -Antihypertensives as above.  Monitor blood pressure  Chronic normocytic anemia -Possibly from heart failure.  Hemoglobin stable.  Non-anion gap metabolic acidosis -Improved  Polysubstance abuse with cocaine and marijuana -Child psychotherapist consult.  Counseled regarding abstinence  CAD -Was previously on aspirin and Plavix but currently not taking these.  Continue aspirin and statin.  Follow cardiology recommendations  Hyperlipidemia -Continue statin  DVT prophylaxis: Lovenox Code Status: Full Family Communication: None at bedside Disposition Plan: Status is: Inpatient  Remains inpatient appropriate because:Inpatient level of care appropriate due to severity of illness.  Still volume overloaded requiring IV diuretics   Dispo: The patient is from: Home              Anticipated d/c is to: Home              Anticipated d/c date is: 1 day              Patient currently is not medically stable to d/c.  Consultants: Cardiology  Procedures: Echo pending  Antimicrobials: None   Subjective: Patient seen and examined at bedside.  Poor historian.   Feels slightly better.  No overnight fever, vomiting reported.  No current chest pain.  Objective: Vitals:   09/08/20 2140 09/09/20 0157 09/09/20 0700 09/09/20 0716  BP: (!) 117/94 (!) 115/97  117/89  Pulse: 97 89  85  Resp: 20 20  18   Temp: (!) 97.4 F (36.3 C) 97.7 F (36.5 C)  97.8 F (36.6 C)  TempSrc: Oral Oral  Oral  SpO2: 100% 97%  100%  Weight:   123.5 kg   Height:        Intake/Output Summary (Last 24 hours) at 09/09/2020 1155 Last data filed at 09/09/2020 1058 Gross per 24 hour  Intake 960 ml  Output 2400 ml  Net -1440 ml   Filed Weights   09/08/20 1137 09/09/20 0700  Weight: 117.9 kg 123.5 kg    Examination:  General exam: Appears calm and comfortable.  Poor historian Respiratory system: Bilateral decreased breath sounds at bases with basilar crackles Cardiovascular system: S1 & S2 heard, Rate controlled Gastrointestinal system: Abdomen is nondistended, soft and nontender. Normal bowel sounds heard. Extremities: No cyanosis, clubbing; bilateral lower extremity edema present Central nervous system: Alert and oriented. No focal neurological deficits. Moving extremities Skin: No rashes, lesions or ulcers Psychiatry: Flat affect    Data Reviewed: I have personally reviewed following labs and imaging studies  CBC: Recent Labs  Lab 09/08/20 1135 09/08/20 1812 09/09/20 0710  WBC 7.2 6.2 5.7  HGB 12.1* 12.1* 11.2*  HCT 36.7* 39.0 34.2*  MCV 90.0 94.4 90.2  PLT 298 282 252   Basic Metabolic Panel: Recent Labs  Lab 09/08/20 1135 09/08/20  1812 09/09/20 0710  NA 139  --  137  K 3.9  --  3.5  CL 108  --  105  CO2 19*  --  23  GLUCOSE 110*  --  98  BUN 36*  --  31*  CREATININE 1.30* 1.47* 1.43*  CALCIUM 8.5*  --  8.3*   GFR: Estimated Creatinine Clearance: 76.3 mL/min (A) (by C-G formula based on SCr of 1.43 mg/dL (H)). Liver Function Tests: Recent Labs  Lab 09/09/20 0710  AST 33  ALT 45*  ALKPHOS 56  BILITOT 1.3*  PROT 5.7*  ALBUMIN 3.1*     No results for input(s): LIPASE, AMYLASE in the last 168 hours. No results for input(s): AMMONIA in the last 168 hours. Coagulation Profile: No results for input(s): INR, PROTIME in the last 168 hours. Cardiac Enzymes: No results for input(s): CKTOTAL, CKMB, CKMBINDEX, TROPONINI in the last 168 hours. BNP (last 3 results) No results for input(s): PROBNP in the last 8760 hours. HbA1C: No results for input(s): HGBA1C in the last 72 hours. CBG: No results for input(s): GLUCAP in the last 168 hours. Lipid Profile: Recent Labs    09/09/20 0710  CHOL 164  HDL 45  LDLCALC 111*  TRIG 38  CHOLHDL 3.6   Thyroid Function Tests: No results for input(s): TSH, T4TOTAL, FREET4, T3FREE, THYROIDAB in the last 72 hours. Anemia Panel: No results for input(s): VITAMINB12, FOLATE, FERRITIN, TIBC, IRON, RETICCTPCT in the last 72 hours. Sepsis Labs: Recent Labs  Lab 09/08/20 1812 09/08/20 1935  LATICACIDVEN 2.3* 1.9    Recent Results (from the past 240 hour(s))  Respiratory Panel by RT PCR (Flu A&B, Covid) - Nasopharyngeal Swab     Status: None   Collection Time: 09/08/20 11:26 AM   Specimen: Nasopharyngeal Swab  Result Value Ref Range Status   SARS Coronavirus 2 by RT PCR NEGATIVE NEGATIVE Final    Comment: (NOTE) SARS-CoV-2 target nucleic acids are NOT DETECTED.  The SARS-CoV-2 RNA is generally detectable in upper respiratoy specimens during the acute phase of infection. The lowest concentration of SARS-CoV-2 viral copies this assay can detect is 131 copies/mL. A negative result does not preclude SARS-Cov-2 infection and should not be used as the sole basis for treatment or other patient management decisions. A negative result may occur with  improper specimen collection/handling, submission of specimen other than nasopharyngeal swab, presence of viral mutation(s) within the areas targeted by this assay, and inadequate number of viral copies (<131 copies/mL). A negative result must  be combined with clinical observations, patient history, and epidemiological information. The expected result is Negative.  Fact Sheet for Patients:  https://www.moore.com/  Fact Sheet for Healthcare Providers:  https://www.young.biz/  This test is no t yet approved or cleared by the [place] FDA and  has been authorized for detection and/or diagnosis of SARS-CoV-2 by FDA under an Emergency Use Authorization (EUA). This EUA will remain  in effect (meaning this test can be used) for the duration of the COVID-19 declaration under Section 564(b)(1) of the Act, 21 U.S.C. section 360bbb-3(b)(1), unless the authorization is terminated or revoked sooner.     Influenza A by PCR NEGATIVE NEGATIVE Final   Influenza B by PCR NEGATIVE NEGATIVE Final    Comment: (NOTE) The Xpert Xpress SARS-CoV-2/FLU/RSV assay is intended as an aid in  the diagnosis of influenza from Nasopharyngeal swab specimens and  should not be used as a sole basis for treatment. Nasal washings and  aspirates are unacceptable for Xpert Xpress  SARS-CoV-2/FLU/RSV  testing.  Fact Sheet for Patients: https://www.moore.com/  Fact Sheet for Healthcare Providers: https://www.young.biz/  This test is not yet approved or cleared by the [place] FDA and  has been authorized for detection and/or diagnosis of SARS-CoV-2 by  FDA under an Emergency Use Authorization (EUA). This EUA will remain  in effect (meaning this test can be used) for the duration of the  Covid-19 declaration under Section 564(b)(1) of the Act, 21  U.S.C. section 360bbb-3(b)(1), unless the authorization is  terminated or revoked. Performed at [facility] [address], [place], [place] [address]          Radiology Studies: DG Chest 2 View  Result Date: 09/08/2020 CLINICAL DATA:  Chest pain. EXAM: CHEST - 2 VIEW COMPARISON:  Multiple priors,  most recent 08/28/2020. FINDINGS: Cardiomegaly and pulmonary vascular congestion without overt pulmonary edema. Possible small left pleural effusion with overlying atelectasis. No visible pneumothorax. No acute osseous abnormality IMPRESSION: 1. Cardiomegaly and pulmonary vascular congestion without overt pulmonary edema. 2. Possible small left pleural effusion with overlying atelectasis. Electronically Signed   By: Feliberto Harts MD   On: 09/08/2020 11:19   CT Angio Chest PE W and/or Wo Contrast  Result Date: 09/08/2020 CLINICAL DATA:  Shortness of breath for the past 3 days. EXAM: CT ANGIOGRAPHY CHEST WITH CONTRAST TECHNIQUE: Multidetector CT imaging of the chest was performed using the standard protocol during bolus administration of intravenous contrast. Multiplanar CT image reconstructions and MIPs were obtained to evaluate the vascular anatomy. CONTRAST:  OMNIPAQUE IOHEXOL 350 MG/ML SOLN COMPARISON:  Chest radiographs obtained earlier today. Chest CT dated 02/24/2015 FINDINGS: Cardiovascular: Normally opacified pulmonary arteries with no pulmonary arterial filling defects seen. Enlarged heart with a significant interval increase in size. This is involving all 4 chambers, most prominent being the left ventricle and right atrium. Mild atheromatous aortic calcification. Mediastinum/Nodes: Mildly enlarged mediastinal nodes. These include an AP window lymph node with a short axis diameter of 22 mm on image number 46 series 4, previously 11 mm. A subcarinal node has a short axis diameter of 21 mm on image number 66, previously not well visualized. No enlarged hilar or axillary nodes. Unremarkable thyroid gland and esophagus. Lungs/Pleura: Prominent pulmonary vasculature. Small right pleural effusion. Upper Abdomen: Unremarkable. Musculoskeletal: Thoracic spine degenerative changes and mild scoliosis. Review of the MIP images confirms the above findings. IMPRESSION: 1. No pulmonary emboli. 2. Interval  cardiomegaly. 3. Pulmonary vascular congestion. 4. Small right pleural effusion. 5. Mild mediastinal adenopathy, nonspecific. 6. Aortic atherosclerosis. Aortic Atherosclerosis (ICD10-I70.0). Electronically Signed   By: Beckie Salts M.D.   On: 09/08/2020 13:59        Scheduled Meds: . aspirin EC  81 mg Oral Daily  . atorvastatin  40 mg Oral Daily  . enoxaparin (LOVENOX) injection  60 mg Subcutaneous Q24H  . furosemide  40 mg Intravenous BID  . isosorbide-hydrALAZINE  1 tablet Oral TID   Continuous Infusions:        Glade Lloyd, MD [facility] 09/09/2020, 11:55 AM

## 2020-09-09 NOTE — Progress Notes (Signed)
Patient continues to request sandwiches and ice cream from fridge throughout the day.  Instructed patient and tech that he is only to eat from the cafeteria hospital as his sodium is being measured that way.  Patient verbalized understanding, but also stated 'what am I supposed to do tonight when I am hungry?'. Will continue to monitor patient.

## 2020-09-09 NOTE — Progress Notes (Signed)
Progress Note  Patient Name: Dale [name] Date of Encounter: 09/09/2020  [facility] Cardiologist: Kristeen Miss, MD   Subjective   Breathing improving but not back to baseline  Inpatient Medications    Scheduled Meds: . aspirin EC  81 mg Oral Daily  . atorvastatin  40 mg Oral Daily  . enoxaparin (LOVENOX) injection  60 mg Subcutaneous Q24H  . furosemide  40 mg Intravenous BID  . isosorbide-hydrALAZINE  1 tablet Oral TID   Continuous Infusions:  PRN Meds: acetaminophen **OR** acetaminophen, ondansetron **OR** ondansetron (ZOFRAN) IV   Vital Signs    Vitals:   09/08/20 2140 09/09/20 0157 09/09/20 0700 09/09/20 0716  BP: (!) 117/94 (!) 115/97  117/89  Pulse: 97 89  85  Resp: 20 20  18   Temp: (!) 97.4 F (36.3 C) 97.7 F (36.5 C)  97.8 F (36.6 C)  TempSrc: Oral Oral  Oral  SpO2: 100% 97%  100%  Weight:   123.5 kg   Height:        Intake/Output Summary (Last 24 hours) at 09/09/2020 0751 Last data filed at 09/08/2020 2142 Gross per 24 hour  Intake 480 ml  Output 2400 ml  Net -1920 ml   Last 3 Weights 09/09/2020 09/08/2020 06/24/2019  Weight (lbs) 272 lb 4.3 oz 260 lb 232 lb  Weight (kg) 123.5 kg 117.935 kg 105.235 kg      Telemetry    SR, rare short runs NSVT- Personally Reviewed  ECG    n/a - Personally Reviewed  Physical Exam   GEN: No acute distress.   Neck: No JVD Cardiac: RRR, no murmurs, rubs, or gallops.  Respiratory: crackles bilateral bases GI: Soft, nontender, non-distended  MS: 1+ bilateral LE edema; No deformity. Neuro:  Nonfocal  Psych: Normal affect   Labs    High Sensitivity Troponin:   Recent Labs  Lab 09/08/20 1135 09/08/20 1327  TROPONINIHS 32* 28*      Chemistry Recent Labs  Lab 09/08/20 1135 09/08/20 1812  NA 139  --   K 3.9  --   CL 108  --   CO2 19*  --   GLUCOSE 110*  --   BUN 36*  --   CREATININE 1.30* 1.47*  CALCIUM 8.5*  --   GFRNONAA >60 55*  ANIONGAP 12  --      Hematology Recent Labs  Lab  09/08/20 1135 09/08/20 1812 09/09/20 0710  WBC 7.2 6.2 5.7  RBC 4.08* 4.13* 3.79*  HGB 12.1* 12.1* 11.2*  HCT 36.7* 39.0 34.2*  MCV 90.0 94.4 90.2  MCH 29.7 29.3 29.6  MCHC 33.0 31.0 32.7  RDW 15.2 15.4 15.2  PLT 298 282 252    BNP Recent Labs  Lab 09/08/20 1135  BNP 2,683.5*     DDimer No results for input(s): DDIMER in the last 168 hours.   Radiology    DG Chest 2 View  Result Date: 09/08/2020 CLINICAL DATA:  Chest pain. EXAM: CHEST - 2 VIEW COMPARISON:  Multiple priors, most recent 08/28/2020. FINDINGS: Cardiomegaly and pulmonary vascular congestion without overt pulmonary edema. Possible small left pleural effusion with overlying atelectasis. No visible pneumothorax. No acute osseous abnormality IMPRESSION: 1. Cardiomegaly and pulmonary vascular congestion without overt pulmonary edema. 2. Possible small left pleural effusion with overlying atelectasis. Electronically Signed   By: 08/30/2020 MD   On: 09/08/2020 11:19   CT Angio Chest PE W and/or Wo Contrast  Result Date: 09/08/2020 CLINICAL DATA:  Shortness of breath for the  past 3 days. EXAM: CT ANGIOGRAPHY CHEST WITH CONTRAST TECHNIQUE: Multidetector CT imaging of the chest was performed using the standard protocol during bolus administration of intravenous contrast. Multiplanar CT image reconstructions and MIPs were obtained to evaluate the vascular anatomy. CONTRAST:  OMNIPAQUE IOHEXOL 350 MG/ML SOLN COMPARISON:  Chest radiographs obtained earlier today. Chest CT dated 02/24/2015 FINDINGS: Cardiovascular: Normally opacified pulmonary arteries with no pulmonary arterial filling defects seen. Enlarged heart with a significant interval increase in size. This is involving all 4 chambers, most prominent being the left ventricle and right atrium. Mild atheromatous aortic calcification. Mediastinum/Nodes: Mildly enlarged mediastinal nodes. These include an AP window lymph node with a short axis diameter of 22 mm on image  number 46 series 4, previously 11 mm. A subcarinal node has a short axis diameter of 21 mm on image number 66, previously not well visualized. No enlarged hilar or axillary nodes. Unremarkable thyroid gland and esophagus. Lungs/Pleura: Prominent pulmonary vasculature. Small right pleural effusion. Upper Abdomen: Unremarkable. Musculoskeletal: Thoracic spine degenerative changes and mild scoliosis. Review of the MIP images confirms the above findings. IMPRESSION: 1. No pulmonary emboli. 2. Interval cardiomegaly. 3. Pulmonary vascular congestion. 4. Small right pleural effusion. 5. Mild mediastinal adenopathy, nonspecific. 6. Aortic atherosclerosis. Aortic Atherosclerosis (ICD10-I70.0). Electronically Signed   By: Beckie Salts M.D.   On: 09/08/2020 13:59    Cardiac Studies     Patient Profile     Dale [name] is a 59 y.o. male with a PMH of CAD, chronic combined CHF, HTN, HLD, and cocaine abuse, who is being seen today for the evaluation of CHF at the request of Dr. Ronaldo Miyamoto  Assessment & Plan    1. 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 CI 3.36 mean PA 20 PCWP 18 LVEDP 27 - repeat echo pending  - patient presented with worsening SOB and LE edema in the setting of medication non-compliance. BNP elevated to 2600s. CXR/CTA Chest with pulmonary vascular congestion. Medical therapy has been affected by noncompliance and ongoing cocaine use - neg 1.9 L yesterday, he is on IV lasix 40mg  bid. Labs pending - medical therapy with bidil. Consider restarting coreg once more euvolemic, use of beta blockers in setting of cocaine use is debated in the literature. From clinic notes had been on ARB in the past but discontinued during an admission due to rising Cr.   - continue IV diuresis today, remains fluid overloaded   2. CAD: CTO of RCA with L>R collaterals and otherwise non-obstructive disease (medically managed).   - no acute issues - continue ASA, statin     For questions or updates, please contact [facility] Please consult [email or web address] for contact info under        Signed, , MD  09/09/2020, 7:51 AM

## 2020-09-09 NOTE — Progress Notes (Signed)
  Echocardiogram 2D Echocardiogram with definity has been performed.  Leta Jungling M 09/09/2020, 8:20 AM

## 2020-09-10 DIAGNOSIS — I10 Essential (primary) hypertension: Secondary | ICD-10-CM | POA: Diagnosis not present

## 2020-09-10 DIAGNOSIS — Z9119 Patient's noncompliance with other medical treatment and regimen: Secondary | ICD-10-CM

## 2020-09-10 DIAGNOSIS — I5043 Acute on chronic combined systolic (congestive) and diastolic (congestive) heart failure: Secondary | ICD-10-CM | POA: Diagnosis not present

## 2020-09-10 DIAGNOSIS — F1910 Other psychoactive substance abuse, uncomplicated: Secondary | ICD-10-CM | POA: Diagnosis not present

## 2020-09-10 LAB — BASIC METABOLIC PANEL
Anion gap: 9 (ref 5–15)
BUN: 21 mg/dL — ABNORMAL HIGH (ref 6–20)
CO2: 24 mmol/L (ref 22–32)
Calcium: 8.4 mg/dL — ABNORMAL LOW (ref 8.9–10.3)
Chloride: 105 mmol/L (ref 98–111)
Creatinine, Ser: 1.19 mg/dL (ref 0.61–1.24)
GFR, Estimated: >60 mL/min (ref >60)
Glucose, Bld: 99 mg/dL (ref 70–99)
Potassium: 3.5 mmol/L (ref 3.5–5.1)
Sodium: 138 mmol/L (ref 135–145)

## 2020-09-10 LAB — MAGNESIUM: Magnesium: 2 mg/dL (ref 1.7–2.4)

## 2020-09-10 MED ORDER — POTASSIUM CHLORIDE CRYS ER 20 MEQ PO TBCR
40.0000 meq | EXTENDED_RELEASE_TABLET | Freq: Four times a day (QID) | ORAL | Status: AC
  Administered 2020-09-10 (×2): 40 meq via ORAL
  Filled 2020-09-10 (×2): qty 2

## 2020-09-10 MED ORDER — CARVEDILOL 3.125 MG PO TABS
3.1250 mg | ORAL_TABLET | Freq: Two times a day (BID) | ORAL | Status: DC
  Administered 2020-09-10 – 2020-09-11 (×3): 3.125 mg via ORAL
  Filled 2020-09-10 (×3): qty 1

## 2020-09-10 NOTE — Progress Notes (Signed)
Patient ID: Dale [name], male   DOB: 1961/03/28, 59 y.o.   MRN: [id number]  PROGRESS NOTE    Dale [name]  VFI:[id number] DOB: [birth year] DOA: 09/08/2020 PCP: Grayce Sessions, NP   Brief Narrative:  59 year old male with history of hypertension, chronic systolic/diastolic heart failure, cocaine abuse, noncompliance presented with dyspnea and bilateral lower extremity edema.  On presentation, chest x-ray showed pulmonary vascular congestion.  He was started on IV Lasix and cardiology was consulted.  Assessment & Plan:   Acute on chronic combined CHF Noncompliance to medications -Currently on IV Lasix and diuresing well.  Cardiology following.  Continue isosorbide and hydralazine. Cardiology has started Coreg today. Strict input and output.  Daily weights.  Fluid restriction. Negative balance of 4520 cc since admission. -Repeat a.m. labs. -Still volume overloaded.  Hypertension -Antihypertensives as above.  Monitor blood pressure  Chronic normocytic anemia -Possibly from heart failure.  Hemoglobin stable.  Non-anion gap metabolic acidosis -Improved  Polysubstance abuse with cocaine and marijuana -Child psychotherapist consult.  Counseled regarding abstinence  CAD -Was previously on aspirin and Plavix but currently not taking these.  Continue aspirin and statin.  Follow cardiology recommendations  Hyperlipidemia -Continue statin  DVT prophylaxis: Lovenox Code Status: Full Family Communication: None at bedside Disposition Plan: Status is: Inpatient  Remains inpatient appropriate because:Inpatient level of care appropriate due to severity of illness.  Still volume overloaded requiring IV diuretics   Dispo: The patient is from: Home              Anticipated d/c is to: Home              Anticipated d/c date is: 2 days              Patient currently is not medically stable to d/c.  Consultants: Cardiology  Procedures: Echo pending  Antimicrobials:  None   Subjective: Patient seen and examined at bedside.  Poor historian. Still short of breath with exertion. States that his swelling is improving but still feels swollen. No overnight chest pain, nausea or vomiting.  Objective: Vitals:   09/09/20 0716 09/09/20 1316 09/09/20 2118 09/10/20 0528  BP: 117/89 127/85 (!) 127/94 (!) 127/96  Pulse: 85 93 88 94  Resp: 18 20 (!) 21 20  Temp: 97.8 F (36.6 C) 97.6 F (36.4 C) 97.9 F (36.6 C) 97.7 F (36.5 C)  TempSrc: Oral Oral Oral Oral  SpO2: 100% 100% 99% 98%  Weight:    119.7 kg  Height:        Intake/Output Summary (Last 24 hours) at 09/10/2020 0854 Last data filed at 09/10/2020 0500 Gross per 24 hour  Intake 1200 ml  Output 3800 ml  Net -2600 ml   Filed Weights   09/08/20 1137 09/09/20 0700 09/10/20 0528  Weight: 117.9 kg 123.5 kg 119.7 kg    Examination:  General exam: Poor historian. No acute distress. Respiratory system: Bilateral decreased breath sounds at bases with bibasilar crackles. No wheezing  cardiovascular system: Rate controlled, S1-S2 heard  gastrointestinal system: Abdomen is nondistended, soft and nontender. Bowel sounds are heard  extremities: 1-2+ lower extremity edema present. No clubbing   Data Reviewed: I have personally reviewed following labs and imaging studies  CBC: Recent Labs  Lab 09/08/20 1135 09/08/20 1812 09/09/20 0710  WBC 7.2 6.2 5.7  HGB 12.1* 12.1* 11.2*  HCT 36.7* 39.0 34.2*  MCV 90.0 94.4 90.2  PLT 298 282 252   Basic Metabolic Panel: Recent Labs  Lab 09/08/20 1135 09/08/20 1812  09/09/20 0710 09/10/20 0633  NA 139  --  137 138  K 3.9  --  3.5 3.5  CL 108  --  105 105  CO2 19*  --  23 24  GLUCOSE 110*  --  98 99  BUN 36*  --  31* 21*  CREATININE 1.30* 1.47* 1.43* 1.19  CALCIUM 8.5*  --  8.3* 8.4*  MG  --   --   --  2.0   GFR: Estimated Creatinine Clearance: 90.2 mL/min (by C-G formula based on SCr of 1.19 mg/dL). Liver Function Tests: Recent Labs  Lab  09/09/20 0710  AST 33  ALT 45*  ALKPHOS 56  BILITOT 1.3*  PROT 5.7*  ALBUMIN 3.1*   No results for input(s): LIPASE, AMYLASE in the last 168 hours. No results for input(s): AMMONIA in the last 168 hours. Coagulation Profile: No results for input(s): INR, PROTIME in the last 168 hours. Cardiac Enzymes: No results for input(s): CKTOTAL, CKMB, CKMBINDEX, TROPONINI in the last 168 hours. BNP (last 3 results) No results for input(s): PROBNP in the last 8760 hours. HbA1C: No results for input(s): HGBA1C in the last 72 hours. CBG: No results for input(s): GLUCAP in the last 168 hours. Lipid Profile: Recent Labs    09/09/20 0710  CHOL 164  HDL 45  LDLCALC 111*  TRIG 38  CHOLHDL 3.6   Thyroid Function Tests: No results for input(s): TSH, T4TOTAL, FREET4, T3FREE, THYROIDAB in the last 72 hours. Anemia Panel: No results for input(s): VITAMINB12, FOLATE, FERRITIN, TIBC, IRON, RETICCTPCT in the last 72 hours. Sepsis Labs: Recent Labs  Lab 09/08/20 1812 09/08/20 1935  LATICACIDVEN 2.3* 1.9    Recent Results (from the past 240 hour(s))  Respiratory Panel by RT PCR (Flu A&B, Covid) - Nasopharyngeal Swab     Status: None   Collection Time: 09/08/20 11:26 AM   Specimen: Nasopharyngeal Swab  Result Value Ref Range Status   SARS Coronavirus 2 by RT PCR NEGATIVE NEGATIVE Final    Comment: (NOTE) SARS-CoV-2 target nucleic acids are NOT DETECTED.  The SARS-CoV-2 RNA is generally detectable in upper respiratoy specimens during the acute phase of infection. The lowest concentration of SARS-CoV-2 viral copies this assay can detect is 131 copies/mL. A negative result does not preclude SARS-Cov-2 infection and should not be used as the sole basis for treatment or other patient management decisions. A negative result may occur with  improper specimen collection/handling, submission of specimen other than nasopharyngeal swab, presence of viral mutation(s) within the areas targeted by  this assay, and inadequate number of viral copies (<131 copies/mL). A negative result must be combined with clinical observations, patient history, and epidemiological information. The expected result is Negative.  Fact Sheet for Patients:  https://www.moore.com/  Fact Sheet for Healthcare Providers:  https://www.young.biz/  This test is no t yet approved or cleared by the [place] FDA and  has been authorized for detection and/or diagnosis of SARS-CoV-2 by FDA under an Emergency Use Authorization (EUA). This EUA will remain  in effect (meaning this test can be used) for the duration of the COVID-19 declaration under Section 564(b)(1) of the Act, 21 U.S.C. section 360bbb-3(b)(1), unless the authorization is terminated or revoked sooner.     Influenza A by PCR NEGATIVE NEGATIVE Final   Influenza B by PCR NEGATIVE NEGATIVE Final    Comment: (NOTE) The Xpert Xpress SARS-CoV-2/FLU/RSV assay is intended as an aid in  the diagnosis of influenza from Nasopharyngeal swab specimens and  should not  be used as a sole basis for treatment. Nasal washings and  aspirates are unacceptable for Xpert Xpress SARS-CoV-2/FLU/RSV  testing.  Fact Sheet for Patients: https://www.moore.com/  Fact Sheet for Healthcare Providers: https://www.young.biz/  This test is not yet approved or cleared by the [place] FDA and  has been authorized for detection and/or diagnosis of SARS-CoV-2 by  FDA under an Emergency Use Authorization (EUA). This EUA will remain  in effect (meaning this test can be used) for the duration of the  Covid-19 declaration under Section 564(b)(1) of the Act, 21  U.S.C. section 360bbb-3(b)(1), unless the authorization is  terminated or revoked. Performed at [facility], [address], [place], [place] [address]          Radiology Studies: DG Chest 2 View  Result Date:  09/08/2020 CLINICAL DATA:  Chest pain. EXAM: CHEST - 2 VIEW COMPARISON:  Multiple priors, most recent 08/28/2020. FINDINGS: Cardiomegaly and pulmonary vascular congestion without overt pulmonary edema. Possible small left pleural effusion with overlying atelectasis. No visible pneumothorax. No acute osseous abnormality IMPRESSION: 1. Cardiomegaly and pulmonary vascular congestion without overt pulmonary edema. 2. Possible small left pleural effusion with overlying atelectasis. Electronically Signed   By: Feliberto Harts MD   On: 09/08/2020 11:19   CT Angio Chest PE W and/or Wo Contrast  Result Date: 09/08/2020 CLINICAL DATA:  Shortness of breath for the past 3 days. EXAM: CT ANGIOGRAPHY CHEST WITH CONTRAST TECHNIQUE: Multidetector CT imaging of the chest was performed using the standard protocol during bolus administration of intravenous contrast. Multiplanar CT image reconstructions and MIPs were obtained to evaluate the vascular anatomy. CONTRAST:  OMNIPAQUE IOHEXOL 350 MG/ML SOLN COMPARISON:  Chest radiographs obtained earlier today. Chest CT dated 02/24/2015 FINDINGS: Cardiovascular: Normally opacified pulmonary arteries with no pulmonary arterial filling defects seen. Enlarged heart with a significant interval increase in size. This is involving all 4 chambers, most prominent being the left ventricle and right atrium. Mild atheromatous aortic calcification. Mediastinum/Nodes: Mildly enlarged mediastinal nodes. These include an AP window lymph node with a short axis diameter of 22 mm on image number 46 series 4, previously 11 mm. A subcarinal node has a short axis diameter of 21 mm on image number 66, previously not well visualized. No enlarged hilar or axillary nodes. Unremarkable thyroid gland and esophagus. Lungs/Pleura: Prominent pulmonary vasculature. Small right pleural effusion. Upper Abdomen: Unremarkable. Musculoskeletal: Thoracic spine degenerative changes and mild scoliosis. Review of the  MIP images confirms the above findings. IMPRESSION: 1. No pulmonary emboli. 2. Interval cardiomegaly. 3. Pulmonary vascular congestion. 4. Small right pleural effusion. 5. Mild mediastinal adenopathy, nonspecific. 6. Aortic atherosclerosis. Aortic Atherosclerosis (ICD10-I70.0). Electronically Signed   By: Beckie Salts M.D.   On: 09/08/2020 13:59   ECHOCARDIOGRAM COMPLETE  Result Date: 09/09/2020    ECHOCARDIOGRAM REPORT   Patient Name:   Dale [name] Date of Exam: 09/09/2020 Medical Rec #:  [id number]     Height:       71.0 in Accession #:    [id number]    Weight:       260.0 lb Date of Birth:  Jan 10, 1961     BSA:          2.357 m Patient Age:    59 years      BP:           117/89 mmHg Patient Gender: M             HR:  85 bpm. Exam Location:  Inpatient Procedure: 2D Echo and Intracardiac Opacification Agent Indications:    CHF-Acute Systolic 428.21 / I50.21  History:        Patient has prior history of Echocardiogram examinations, most                 recent 03/11/2019. CAD, Signs/Symptoms:Shortness of Breath; Risk                 Factors:Hypertension and Dyslipidemia. Lower extremity edema.                 Substance abuse.  Sonographer:    Leta Jungling RDCS Referring Phys: [id number] Teddy Spike IMPRESSIONS  1. Left ventricular ejection fraction, by estimation, is approximately 20%. The left ventricle has severely decreased function. The left ventricle demonstrates global hypokinesis with regional variation as noted. The left ventricular internal cavity size was moderately dilated. Left ventricular diastolic parameters are consistent with Grade III diastolic dysfunction (restrictive). No obvious mural thrombus noted with Definity contrast.  2. Right ventricular systolic function is moderately to severely reduced. The right ventricular size is normal. There is moderately elevated pulmonary artery systolic pressure. The estimated right ventricular systolic pressure is 52.0 mmHg.  3. Left atrial size was  mild to moderately dilated.  4. Right atrial size was mild to moderately dilated.  5. The mitral valve is grossly normal. Mild mitral valve regurgitation.  6. The aortic valve is tricuspid. Aortic valve regurgitation is trivial.  7. The inferior vena cava is dilated in size with <50% respiratory variability, suggesting right atrial pressure of 15 mmHg. FINDINGS  Left Ventricle: Left ventricular ejection fraction, by estimation, is 20%. The left ventricle has severely decreased function. The left ventricle demonstrates regional wall motion abnormalities. Definity contrast agent was given IV to delineate the left  ventricular endocardial borders. The left ventricular internal cavity size was moderately dilated. There is borderline left ventricular hypertrophy. Left ventricular diastolic parameters are consistent with Grade III diastolic dysfunction (restrictive).  LV Wall Scoring: The anterior septum, inferior wall, and mid inferoseptal segment are akinetic. The entire anterior wall, entire lateral wall, entire apex, and basal inferoseptal segment are hypokinetic. Right Ventricle: The right ventricular size is normal. No increase in right ventricular wall thickness. Right ventricular systolic function is severely reduced. There is moderately elevated pulmonary artery systolic pressure. The tricuspid regurgitant velocity is 3.04 m/s, and with an assumed right atrial pressure of 15 mmHg, the estimated right ventricular systolic pressure is 52.0 mmHg. Left Atrium: Left atrial size was mild to moderately dilated. Right Atrium: Right atrial size was mild to moderately dilated. Pericardium: There is no evidence of pericardial effusion. Mitral Valve: The mitral valve is grossly normal. Mild mitral valve regurgitation. Tricuspid Valve: The tricuspid valve is grossly normal. Tricuspid valve regurgitation is mild. Aortic Valve: The aortic valve is tricuspid. Aortic valve regurgitation is trivial. Pulmonic Valve: The pulmonic  valve was grossly normal. Pulmonic valve regurgitation is mild. Aorta: The aortic root is normal in size and structure. Venous: The inferior vena cava is dilated in size with less than 50% respiratory variability, suggesting right atrial pressure of 15 mmHg. IAS/Shunts: No atrial level shunt detected by color flow Doppler.  LEFT VENTRICLE PLAX 2D LVIDd:         6.60 cm LVIDs:         6.50 cm LV PW:         1.10 cm LV IVS:        1.00 cm  LVOT diam:     2.10 cm LV SV:         47 LV SV Index:   20 LVOT Area:     3.46 cm  LV Volumes (MOD) LV vol d, MOD A2C: 424.0 ml LV vol d, MOD A4C: 271.0 ml LV vol s, MOD A2C: 314.0 ml LV vol s, MOD A4C: 224.0 ml LV SV MOD A2C:     110.0 ml LV SV MOD A4C:     271.0 ml LV SV MOD BP:      78.2 ml RIGHT VENTRICLE RV S prime:     8.67 cm/s TAPSE (M-mode): 1.0 cm LEFT ATRIUM              Index       RIGHT ATRIUM           Index LA diam:        4.40 cm  1.87 cm/m  RA Area:     28.20 cm LA Vol (A2C):   103.0 ml 43.69 ml/m RA Volume:   102.00 ml 43.27 ml/m LA Vol (A4C):   83.2 ml  35.29 ml/m LA Biplane Vol: 94.4 ml  40.05 ml/m  AORTIC VALVE LVOT Vmax:   92.70 cm/s LVOT Vmean:  64.100 cm/s LVOT VTI:    0.136 m  AORTA Ao Root diam: 3.70 cm Ao Asc diam:  3.50 cm MITRAL VALVE               TRICUSPID VALVE MV Area (PHT): 7.82 cm    TR Peak grad:   37.0 mmHg MV Decel Time: 97 msec     TR Vmax:        304.00 cm/s MV E velocity: 97.70 cm/s MV A velocity: 47.60 cm/s  SHUNTS MV E/A ratio:  2.05        Systemic VTI:  0.14 m                            Systemic Diam: 2.10 cm Nona Dell MD Electronically signed by Nona Dell MD Signature Date/Time: 09/09/2020/12:40:59 PM    Final         Scheduled Meds: . aspirin EC  81 mg Oral Daily  . atorvastatin  40 mg Oral Daily  . carvedilol  3.125 mg Oral BID WC  . enoxaparin (LOVENOX) injection  60 mg Subcutaneous Q24H  . furosemide  40 mg Intravenous BID  . isosorbide-hydrALAZINE  1 tablet Oral TID  . potassium chloride  40 mEq Oral  Q6H   Continuous Infusions:        Glade Lloyd, MD [facility] 09/10/2020, 8:54 AM

## 2020-09-10 NOTE — Progress Notes (Signed)
Progress Note  Patient Name: Dale [name] Date of Encounter: 09/10/2020  [facility] Cardiologist: Kristeen Miss, MD   Subjective   SOB improving but not resolved  Inpatient Medications    Scheduled Meds: . aspirin EC  81 mg Oral Daily  . atorvastatin  40 mg Oral Daily  . enoxaparin (LOVENOX) injection  60 mg Subcutaneous Q24H  . furosemide  40 mg Intravenous BID  . isosorbide-hydrALAZINE  1 tablet Oral TID   Continuous Infusions:  PRN Meds: acetaminophen **OR** acetaminophen, ondansetron **OR** ondansetron (ZOFRAN) IV   Vital Signs    Vitals:   09/09/20 0716 09/09/20 1316 09/09/20 2118 09/10/20 0528  BP: 117/89 127/85 (!) 127/94 (!) 127/96  Pulse: 85 93 88 94  Resp: 18 20 (!) 21 20  Temp: 97.8 F (36.6 C) 97.6 F (36.4 C) 97.9 F (36.6 C) 97.7 F (36.5 C)  TempSrc: Oral Oral Oral Oral  SpO2: 100% 100% 99% 98%  Weight:    119.7 kg  Height:        Intake/Output Summary (Last 24 hours) at 09/10/2020 0819 Last data filed at 09/10/2020 0500 Gross per 24 hour  Intake 1200 ml  Output 3800 ml  Net -2600 ml   Last 3 Weights 09/10/2020 09/09/2020 09/08/2020  Weight (lbs) 263 lb 14.4 oz 272 lb 4.3 oz 260 lb  Weight (kg) 119.704 kg 123.5 kg 117.935 kg      Telemetry    SR, 33 beat run of NSVT- Personally Reviewed  ECG    n/a - Personally Reviewed  Physical Exam   GEN: No acute distress.   Neck: No JVD Cardiac: RRR, no murmurs, rubs, or gallops.  Respiratory: Clear to auscultation bilaterally. GI: Soft, nontender, non-distended  MS: 1+ bilateral LE edema; No deformity. Neuro:  Nonfocal  Psych: Normal affect   Labs    High Sensitivity Troponin:   Recent Labs  Lab 09/08/20 1135 09/08/20 1327  TROPONINIHS 32* 28*      Chemistry Recent Labs  Lab 09/08/20 1135 09/08/20 1135 09/08/20 1812 09/09/20 0710 09/10/20 0633  NA 139  --   --  137 138  K 3.9  --   --  3.5 3.5  CL 108  --   --  105 105  CO2 19*  --   --  23 24  GLUCOSE 110*  --    --  98 99  BUN 36*  --   --  31* 21*  CREATININE 1.30*   < > 1.47* 1.43* 1.19  CALCIUM 8.5*  --   --  8.3* 8.4*  PROT  --   --   --  5.7*  --   ALBUMIN  --   --   --  3.1*  --   AST  --   --   --  33  --   ALT  --   --   --  45*  --   ALKPHOS  --   --   --  56  --   BILITOT  --   --   --  1.3*  --   GFRNONAA >60   < > 55* 57* >60  ANIONGAP 12  --   --  9 9   < > = values in this interval not displayed.     Hematology Recent Labs  Lab 09/08/20 1135 09/08/20 1812 09/09/20 0710  WBC 7.2 6.2 5.7  RBC 4.08* 4.13* 3.79*  HGB 12.1* 12.1* 11.2*  HCT 36.7* 39.0 34.2*  MCV 90.0 94.4 90.2  MCH 29.7 29.3 29.6  MCHC 33.0 31.0 32.7  RDW 15.2 15.4 15.2  PLT 298 282 252    BNP Recent Labs  Lab 09/08/20 1135  BNP 2,683.5*     DDimer No results for input(s): DDIMER in the last 168 hours.   Radiology    DG Chest 2 View  Result Date: 09/08/2020 CLINICAL DATA:  Chest pain. EXAM: CHEST - 2 VIEW COMPARISON:  Multiple priors, most recent 08/28/2020. FINDINGS: Cardiomegaly and pulmonary vascular congestion without overt pulmonary edema. Possible small left pleural effusion with overlying atelectasis. No visible pneumothorax. No acute osseous abnormality IMPRESSION: 1. Cardiomegaly and pulmonary vascular congestion without overt pulmonary edema. 2. Possible small left pleural effusion with overlying atelectasis. Electronically Signed   By: Feliberto Harts MD   On: 09/08/2020 11:19   CT Angio Chest PE W and/or Wo Contrast  Result Date: 09/08/2020 CLINICAL DATA:  Shortness of breath for the past 3 days. EXAM: CT ANGIOGRAPHY CHEST WITH CONTRAST TECHNIQUE: Multidetector CT imaging of the chest was performed using the standard protocol during bolus administration of intravenous contrast. Multiplanar CT image reconstructions and MIPs were obtained to evaluate the vascular anatomy. CONTRAST:  OMNIPAQUE IOHEXOL 350 MG/ML SOLN COMPARISON:  Chest radiographs obtained earlier today. Chest CT  dated 02/24/2015 FINDINGS: Cardiovascular: Normally opacified pulmonary arteries with no pulmonary arterial filling defects seen. Enlarged heart with a significant interval increase in size. This is involving all 4 chambers, most prominent being the left ventricle and right atrium. Mild atheromatous aortic calcification. Mediastinum/Nodes: Mildly enlarged mediastinal nodes. These include an AP window lymph node with a short axis diameter of 22 mm on image number 46 series 4, previously 11 mm. A subcarinal node has a short axis diameter of 21 mm on image number 66, previously not well visualized. No enlarged hilar or axillary nodes. Unremarkable thyroid gland and esophagus. Lungs/Pleura: Prominent pulmonary vasculature. Small right pleural effusion. Upper Abdomen: Unremarkable. Musculoskeletal: Thoracic spine degenerative changes and mild scoliosis. Review of the MIP images confirms the above findings. IMPRESSION: 1. No pulmonary emboli. 2. Interval cardiomegaly. 3. Pulmonary vascular congestion. 4. Small right pleural effusion. 5. Mild mediastinal adenopathy, nonspecific. 6. Aortic atherosclerosis. Aortic Atherosclerosis (ICD10-I70.0). Electronically Signed   By: Beckie Salts M.D.   On: 09/08/2020 13:59   ECHOCARDIOGRAM COMPLETE  Result Date: 09/09/2020    ECHOCARDIOGRAM REPORT   Patient Name:   Dale [name] Date of Exam: 09/09/2020 Medical Rec #:  [id number]     Height:       71.0 in Accession #:    [id number]    Weight:       260.0 lb Date of Birth:  12-13-1960     BSA:          2.357 m Patient Age:    59 years      BP:           117/89 mmHg Patient Gender: M             HR:           85 bpm. Exam Location:  Inpatient Procedure: 2D Echo and Intracardiac Opacification Agent Indications:    CHF-Acute Systolic 428.21 / I50.21  History:        Patient has prior history of Echocardiogram examinations, most                 recent 03/11/2019. CAD, Signs/Symptoms:Shortness of Breath; Risk  Factors:Hypertension and Dyslipidemia. Lower extremity edema.                 Substance abuse.  Sonographer:    Leta Jungling RDCS Referring Phys: [id number] Teddy Spike IMPRESSIONS  1. Left ventricular ejection fraction, by estimation, is approximately 20%. The left ventricle has severely decreased function. The left ventricle demonstrates global hypokinesis with regional variation as noted. The left ventricular internal cavity size was moderately dilated. Left ventricular diastolic parameters are consistent with Grade III diastolic dysfunction (restrictive). No obvious mural thrombus noted with Definity contrast.  2. Right ventricular systolic function is moderately to severely reduced. The right ventricular size is normal. There is moderately elevated pulmonary artery systolic pressure. The estimated right ventricular systolic pressure is 52.0 mmHg.  3. Left atrial size was mild to moderately dilated.  4. Right atrial size was mild to moderately dilated.  5. The mitral valve is grossly normal. Mild mitral valve regurgitation.  6. The aortic valve is tricuspid. Aortic valve regurgitation is trivial.  7. The inferior vena cava is dilated in size with <50% respiratory variability, suggesting right atrial pressure of 15 mmHg. FINDINGS  Left Ventricle: Left ventricular ejection fraction, by estimation, is 20%. The left ventricle has severely decreased function. The left ventricle demonstrates regional wall motion abnormalities. Definity contrast agent was given IV to delineate the left  ventricular endocardial borders. The left ventricular internal cavity size was moderately dilated. There is borderline left ventricular hypertrophy. Left ventricular diastolic parameters are consistent with Grade III diastolic dysfunction (restrictive).  LV Wall Scoring: The anterior septum, inferior wall, and mid inferoseptal segment are akinetic. The entire anterior wall, entire lateral wall, entire apex, and basal inferoseptal  segment are hypokinetic. Right Ventricle: The right ventricular size is normal. No increase in right ventricular wall thickness. Right ventricular systolic function is severely reduced. There is moderately elevated pulmonary artery systolic pressure. The tricuspid regurgitant velocity is 3.04 m/s, and with an assumed right atrial pressure of 15 mmHg, the estimated right ventricular systolic pressure is 52.0 mmHg. Left Atrium: Left atrial size was mild to moderately dilated. Right Atrium: Right atrial size was mild to moderately dilated. Pericardium: There is no evidence of pericardial effusion. Mitral Valve: The mitral valve is grossly normal. Mild mitral valve regurgitation. Tricuspid Valve: The tricuspid valve is grossly normal. Tricuspid valve regurgitation is mild. Aortic Valve: The aortic valve is tricuspid. Aortic valve regurgitation is trivial. Pulmonic Valve: The pulmonic valve was grossly normal. Pulmonic valve regurgitation is mild. Aorta: The aortic root is normal in size and structure. Venous: The inferior vena cava is dilated in size with less than 50% respiratory variability, suggesting right atrial pressure of 15 mmHg. IAS/Shunts: No atrial level shunt detected by color flow Doppler.  LEFT VENTRICLE PLAX 2D LVIDd:         6.60 cm LVIDs:         6.50 cm LV PW:         1.10 cm LV IVS:        1.00 cm LVOT diam:     2.10 cm LV SV:         47 LV SV Index:   20 LVOT Area:     3.46 cm  LV Volumes (MOD) LV vol d, MOD A2C: 424.0 ml LV vol d, MOD A4C: 271.0 ml LV vol s, MOD A2C: 314.0 ml LV vol s, MOD A4C: 224.0 ml LV SV MOD A2C:     110.0 ml LV SV MOD A4C:  271.0 ml LV SV MOD BP:      78.2 ml RIGHT VENTRICLE RV S prime:     8.67 cm/s TAPSE (M-mode): 1.0 cm LEFT ATRIUM              Index       RIGHT ATRIUM           Index LA diam:        4.40 cm  1.87 cm/m  RA Area:     28.20 cm LA Vol (A2C):   103.0 ml 43.69 ml/m RA Volume:   102.00 ml 43.27 ml/m LA Vol (A4C):   83.2 ml  35.29 ml/m LA Biplane Vol:  94.4 ml  40.05 ml/m  AORTIC VALVE LVOT Vmax:   92.70 cm/s LVOT Vmean:  64.100 cm/s LVOT VTI:    0.136 m  AORTA Ao Root diam: 3.70 cm Ao Asc diam:  3.50 cm MITRAL VALVE               TRICUSPID VALVE MV Area (PHT): 7.82 cm    TR Peak grad:   37.0 mmHg MV Decel Time: 97 msec     TR Vmax:        304.00 cm/s MV E velocity: 97.70 cm/s MV A velocity: 47.60 cm/s  SHUNTS MV E/A ratio:  2.05        Systemic VTI:  0.14 m                            Systemic Diam: 2.10 cm Nona Dell MD Electronically signed by Nona Dell MD Signature Date/Time: 09/09/2020/12:40:59 PM    Final     Cardiac Studies     Patient Profile     Dale [name] a 59 y.o.malewith a PMH of CAD, chronic combined CHF, HTN, HLD, and cocaine abuse,who is being seen today for the evaluation of CHFat the request of Dr. Ronaldo Miyamoto  Assessment & Plan    1. 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 CI 3.36 mean PA 20 PCWP 18 LVEDP 27 - 09/2020 echo LVEF 20%, grade III DDx, mod to severe RV dysfunction, dilated IVC  - patient presented with worsening SOB and LE edema in the setting of medication non-compliance. BNP elevated to 2600s. CXR/CTA Chest with pulmonary vascular congestion. Medical therapy has been affected by noncompliance and ongoing cocaine use  - neg 2.6 L yesterday, neg 4.5 L since admission, he is on IV lasix 40mg  bid. Cr trending down with diuresis consistent with venous congestion and CHF  - medical therapy with bidil. Consider restarting coreg once more euvolemic, use of beta blockers in setting of cocaine use is debated in the literature. With 33 beat run of NSVT will go ahead and start coreg 3.125mg  bid today.  From clinic notes had been on ARB in the past but discontinued during an admission due to rising Cr.      2. CAD:CTO of RCA with L>R collaterals and otherwise non-obstructive disease (medically managed).  -  no acute issues - continue ASA, statin  3. NSVT - 33 beat run last night. K 3.5 this AM, Mg 2 - will give KCl x 2 today.  - start coreg 3.125mg  bid  For questions or updates, please contact [facility] Please consult [email or web address] for contact info under        Signed, , MD  09/10/2020, 8:19 AM

## 2020-09-11 DIAGNOSIS — I429 Cardiomyopathy, unspecified: Secondary | ICD-10-CM

## 2020-09-11 DIAGNOSIS — I5043 Acute on chronic combined systolic (congestive) and diastolic (congestive) heart failure: Secondary | ICD-10-CM | POA: Diagnosis not present

## 2020-09-11 DIAGNOSIS — I2510 Atherosclerotic heart disease of native coronary artery without angina pectoris: Secondary | ICD-10-CM | POA: Diagnosis not present

## 2020-09-11 DIAGNOSIS — F1910 Other psychoactive substance abuse, uncomplicated: Secondary | ICD-10-CM | POA: Diagnosis not present

## 2020-09-11 DIAGNOSIS — I10 Essential (primary) hypertension: Secondary | ICD-10-CM | POA: Diagnosis not present

## 2020-09-11 DIAGNOSIS — Z9119 Patient's noncompliance with other medical treatment and regimen: Secondary | ICD-10-CM | POA: Diagnosis not present

## 2020-09-11 LAB — MAGNESIUM: Magnesium: 2.1 mg/dL (ref 1.7–2.4)

## 2020-09-11 LAB — BASIC METABOLIC PANEL
Anion gap: 7 (ref 5–15)
BUN: 23 mg/dL — ABNORMAL HIGH (ref 6–20)
CO2: 26 mmol/L (ref 22–32)
Calcium: 8.4 mg/dL — ABNORMAL LOW (ref 8.9–10.3)
Chloride: 105 mmol/L (ref 98–111)
Creatinine, Ser: 1.15 mg/dL (ref 0.61–1.24)
GFR, Estimated: >60 mL/min (ref >60)
Glucose, Bld: 94 mg/dL (ref 70–99)
Potassium: 3.7 mmol/L (ref 3.5–5.1)
Sodium: 138 mmol/L (ref 135–145)

## 2020-09-11 MED ORDER — SPIRONOLACTONE 25 MG PO TABS
25.0000 mg | ORAL_TABLET | Freq: Every day | ORAL | 0 refills | Status: DC
Start: 2020-09-11 — End: 2020-10-21

## 2020-09-11 MED ORDER — ISOSORB DINITRATE-HYDRALAZINE 20-37.5 MG PO TABS: 1.0000 | ORAL_TABLET | Freq: Three times a day (TID) | ORAL | 0 refills | Status: AC

## 2020-09-11 MED ORDER — TORSEMIDE 20 MG PO TABS
20.0000 mg | ORAL_TABLET | Freq: Every day | ORAL | 0 refills | Status: DC
Start: 2020-09-11 — End: 2020-10-21

## 2020-09-11 NOTE — Discharge Summary (Signed)
Physician Discharge Summary  Dale [name] NWG:[id number] DOB: 08/07/1961 DOA: 09/08/2020  PCP: Grayce Sessions, NP  Admit date: 09/08/2020 Discharge date: 09/11/2020  Admitted From: Home Disposition: Home  Recommendations for Outpatient Follow-up:  1. Follow up with PCP in 1 week with repeat CBC/BMP 2.  3. Follow up in ED if symptoms worsen or new appear   Home Health: No Equipment/Devices: None  Discharge Condition: Stable CODE STATUS: Full Diet recommendation: Heart healthy  Brief/Interim Summary: 59 year old male with history of hypertension, chronic systolic/diastolic heart failure, cocaine abuse, noncompliance presented with dyspnea and bilateral lower extremity edema.  On presentation, chest x-ray showed pulmonary vascular congestion.  He was started on IV Lasix and cardiology was consulted.  During hospitalization, he has diuresed well.  His respiratory status has improved.  He feels much better and wants to go home.  Cardiology has cleared the patient for discharge.  Discharge patient home on oral diuretics.  Discharge Diagnoses:   Acute on chronic combined CHF Noncompliance to medications -Currently on IV Lasix and diuresing well.   - Negative balance of 8730 cc since admission. -Echo done during this admission shows EF of 20% with grade 3 diastolic dysfunction -Cardiology has cleared the patient for discharge.  Discharge patient home on oral torsemide 20 mg daily, Coreg 12.5 mg twice a day, BiDil 1 tab 3 times a day and spironolactone 25 mg daily as per cardiology recommendations.  Comply with medications and follow-up.  Outpatient follow-up with cardiology   Hypertension -Antihypertensives as above.  Outpatient follow-up  Chronic normocytic anemia -Possibly from heart failure.  Hemoglobin stable.  Non-anion gap metabolic acidosis -Improved  Polysubstance abuse with cocaine and marijuana -Child psychotherapist consult.  Counseled regarding abstinence  CAD -Was  previously on aspirin and Plavix but currently not taking these.  Continue aspirin and statin.  Cardiology is okay for Plavix to be discontinued.  Hyperlipidemia -Continue statin  Discharge Instructions  Discharge Instructions    Ambulatory referral to Cardiology   Complete by: As directed    Hospital followup   Diet - low sodium heart healthy   Complete by: As directed    Increase activity slowly   Complete by: As directed      Allergies as of 09/11/2020      Reactions   Hydrocodone Itching      Medication List    STOP taking these medications   clopidogrel 75 MG tablet Commonly known as: PLAVIX   pantoprazole 40 MG tablet Commonly known as: Protonix   prazosin 1 MG capsule Commonly known as: MINIPRESS     TAKE these medications   aspirin 81 MG EC tablet Take 1 tablet (81 mg total) by mouth daily.   atorvastatin 80 MG tablet Commonly known as: Lipitor Take 1 tablet (80 mg total) by mouth daily.   carvedilol 12.5 MG tablet Commonly known as: COREG Take 1 tablet (12.5 mg total) by mouth 2 (two) times daily with a meal.   diclofenac sodium 1 % Gel Commonly known as: VOLTAREN Apply 2 g topically 4 (four) times daily as needed (pain).   isosorbide-hydrALAZINE 20-37.5 MG tablet Commonly known as: BIDIL Take 1 tablet by mouth 3 (three) times daily. Please make yearly appt with Dr. Elease Hashimoto for July for future refills. 1st attempt   spironolactone 25 MG tablet Commonly known as: ALDACTONE Take 1 tablet (25 mg total) by mouth daily.   torsemide 20 MG tablet Commonly known as: DEMADEX Take 1 tablet (20 mg total) by mouth daily. Please  make overdue appt with Dr. Elease Hashimoto before anymore refills. 1st attempt       Follow-up Information    Nahser, Deloris Ping, MD Follow up on 09/25/2020.   Specialty: Cardiology Why: at 4:00PM  Contact information: [address] [place] [address] [phone]              Allergies  Allergen Reactions  .  Hydrocodone Itching    Consultations:  Cardiology   Procedures/Studies: DG Chest 2 View  Result Date: 09/08/2020 CLINICAL DATA:  Chest pain. EXAM: CHEST - 2 VIEW COMPARISON:  Multiple priors, most recent 08/28/2020. FINDINGS: Cardiomegaly and pulmonary vascular congestion without overt pulmonary edema. Possible small left pleural effusion with overlying atelectasis. No visible pneumothorax. No acute osseous abnormality IMPRESSION: 1. Cardiomegaly and pulmonary vascular congestion without overt pulmonary edema. 2. Possible small left pleural effusion with overlying atelectasis. Electronically Signed   By: Feliberto Harts MD   On: 09/08/2020 11:19   DG Chest 2 View  Result Date: 08/24/2020 CLINICAL DATA:  Shortness of breath. Bilateral lower extremity edema. EXAM: CHEST - 2 VIEW COMPARISON:  06/24/2019. FINDINGS: Stable cardiomegaly. Mild left mid lung and bibasilar subsegmental atelectasis. No pleural effusion or pneumothorax. Degenerative change thoracic spine. IMPRESSION: 1. Stable cardiomegaly. 2. Mild left mid lung and bibasilar subsegmental atelectasis. Electronically Signed   By: Maisie Fus  Register   On: 08/24/2020 06:56   CT Angio Chest PE W and/or Wo Contrast  Result Date: 09/08/2020 CLINICAL DATA:  Shortness of breath for the past 3 days. EXAM: CT ANGIOGRAPHY CHEST WITH CONTRAST TECHNIQUE: Multidetector CT imaging of the chest was performed using the standard protocol during bolus administration of intravenous contrast. Multiplanar CT image reconstructions and MIPs were obtained to evaluate the vascular anatomy. CONTRAST:  OMNIPAQUE IOHEXOL 350 MG/ML SOLN COMPARISON:  Chest radiographs obtained earlier today. Chest CT dated 02/24/2015 FINDINGS: Cardiovascular: Normally opacified pulmonary arteries with no pulmonary arterial filling defects seen. Enlarged heart with a significant interval increase in size. This is involving all 4 chambers, most prominent being the left ventricle and  right atrium. Mild atheromatous aortic calcification. Mediastinum/Nodes: Mildly enlarged mediastinal nodes. These include an AP window lymph node with a short axis diameter of 22 mm on image number 46 series 4, previously 11 mm. A subcarinal node has a short axis diameter of 21 mm on image number 66, previously not well visualized. No enlarged hilar or axillary nodes. Unremarkable thyroid gland and esophagus. Lungs/Pleura: Prominent pulmonary vasculature. Small right pleural effusion. Upper Abdomen: Unremarkable. Musculoskeletal: Thoracic spine degenerative changes and mild scoliosis. Review of the MIP images confirms the above findings. IMPRESSION: 1. No pulmonary emboli. 2. Interval cardiomegaly. 3. Pulmonary vascular congestion. 4. Small right pleural effusion. 5. Mild mediastinal adenopathy, nonspecific. 6. Aortic atherosclerosis. Aortic Atherosclerosis (ICD10-I70.0). Electronically Signed   By: Beckie Salts M.D.   On: 09/08/2020 13:59   DG Chest Portable 1 View  Result Date: 08/28/2020 CLINICAL DATA:  Shortness of breath.  Nausea and vomiting. EXAM: PORTABLE CHEST 1 VIEW COMPARISON:  Radiograph 4 days ago 08/24/2020.  Chest CT 02/24/2015 FINDINGS: Cardiomegaly, not significantly changed allowing for differences in technique. There is vascular congestion. Possible fluid in the right minor fissure. No large subpulmonic effusion. No focal consolidation. Improved left lung atelectasis. No pneumothorax. No acute osseous abnormalities are seen. IMPRESSION: Cardiomegaly with vascular congestion. Possible fluid in the right minor fissure. Electronically Signed   By: Narda Rutherford M.D.   On: 08/28/2020 01:37   ECHOCARDIOGRAM COMPLETE  Result Date: 09/09/2020    ECHOCARDIOGRAM REPORT   Patient Name:   ELIZAR ALPERN Date of Exam: 09/09/2020 Medical Rec #:  [id number]     Height:       71.0 in Accession #:    [id number]    Weight:       260.0 lb Date of Birth:  [birth year]     BSA:          2.357 m Patient Age:     59 years      BP:           117/89 mmHg Patient Gender: M             HR:           85 bpm. Exam Location:  Inpatient Procedure: 2D Echo and Intracardiac Opacification Agent Indications:    CHF-Acute Systolic 428.21 / I50.21  History:        Patient has prior history of Echocardiogram examinations, most                 recent 03/11/2019. CAD, Signs/Symptoms:Shortness of Breath; Risk                 Factors:Hypertension and Dyslipidemia. Lower extremity edema.                 Substance abuse.  Sonographer:    Leta Jungling RDCS Referring Phys: [id number] Teddy Spike IMPRESSIONS  1. Left ventricular ejection fraction, by estimation, is approximately 20%. The left ventricle has severely decreased function. The left ventricle demonstrates global hypokinesis with regional variation as noted. The left ventricular internal cavity size was moderately dilated. Left ventricular diastolic parameters are consistent with Grade III diastolic dysfunction (restrictive). No obvious mural thrombus noted with Definity contrast.  2. Right ventricular systolic function is moderately to severely reduced. The right ventricular size is normal. There is moderately elevated pulmonary artery systolic pressure. The estimated right ventricular systolic pressure is 52.0 mmHg.  3. Left atrial size was mild to moderately dilated.  4. Right atrial size was mild to moderately dilated.  5. The mitral valve is grossly normal. Mild mitral valve regurgitation.  6. The aortic valve is tricuspid. Aortic valve regurgitation is trivial.  7. The inferior vena cava is dilated in size with <50% respiratory variability, suggesting right atrial pressure of 15 mmHg. FINDINGS  Left Ventricle: Left ventricular ejection fraction, by estimation, is 20%. The left ventricle has severely decreased function. The left ventricle demonstrates regional wall motion abnormalities. Definity contrast agent was given IV to delineate the left  ventricular endocardial borders. The  left ventricular internal cavity size was moderately dilated. There is borderline left ventricular hypertrophy. Left ventricular diastolic parameters are consistent with Grade III diastolic dysfunction (restrictive).  LV Wall Scoring: The anterior septum, inferior wall, and mid inferoseptal segment are akinetic. The entire anterior wall, entire lateral wall, entire apex, and basal inferoseptal segment are hypokinetic. Right Ventricle: The right ventricular size is normal. No increase in right ventricular wall thickness. Right ventricular systolic function is severely reduced. There is moderately elevated pulmonary artery systolic pressure. The tricuspid regurgitant velocity is 3.04 m/s, and with an assumed right atrial pressure of 15 mmHg, the estimated right ventricular systolic pressure is 52.0 mmHg. Left Atrium: Left atrial size was mild to moderately dilated. Right Atrium: Right atrial size was mild to moderately dilated. Pericardium: There is no evidence of pericardial effusion. Mitral Valve: The mitral valve is grossly normal. Mild mitral valve regurgitation. Tricuspid Valve:  The tricuspid valve is grossly normal. Tricuspid valve regurgitation is mild. Aortic Valve: The aortic valve is tricuspid. Aortic valve regurgitation is trivial. Pulmonic Valve: The pulmonic valve was grossly normal. Pulmonic valve regurgitation is mild. Aorta: The aortic root is normal in size and structure. Venous: The inferior vena cava is dilated in size with less than 50% respiratory variability, suggesting right atrial pressure of 15 mmHg. IAS/Shunts: No atrial level shunt detected by color flow Doppler.  LEFT VENTRICLE PLAX 2D LVIDd:         6.60 cm LVIDs:         6.50 cm LV PW:         1.10 cm LV IVS:        1.00 cm LVOT diam:     2.10 cm LV SV:         47 LV SV Index:   20 LVOT Area:     3.46 cm  LV Volumes (MOD) LV vol d, MOD A2C: 424.0 ml LV vol d, MOD A4C: 271.0 ml LV vol s, MOD A2C: 314.0 ml LV vol s, MOD A4C: 224.0 ml LV SV  MOD A2C:     110.0 ml LV SV MOD A4C:     271.0 ml LV SV MOD BP:      78.2 ml RIGHT VENTRICLE RV S prime:     8.67 cm/s TAPSE (M-mode): 1.0 cm LEFT ATRIUM              Index       RIGHT ATRIUM           Index LA diam:        4.40 cm  1.87 cm/m  RA Area:     28.20 cm LA Vol (A2C):   103.0 ml 43.69 ml/m RA Volume:   102.00 ml 43.27 ml/m LA Vol (A4C):   83.2 ml  35.29 ml/m LA Biplane Vol: 94.4 ml  40.05 ml/m  AORTIC VALVE LVOT Vmax:   92.70 cm/s LVOT Vmean:  64.100 cm/s LVOT VTI:    0.136 m  AORTA Ao Root diam: 3.70 cm Ao Asc diam:  3.50 cm MITRAL VALVE               TRICUSPID VALVE MV Area (PHT): 7.82 cm    TR Peak grad:   37.0 mmHg MV Decel Time: 97 msec     TR Vmax:        304.00 cm/s MV E velocity: 97.70 cm/s MV A velocity: 47.60 cm/s  SHUNTS MV E/A ratio:  2.05        Systemic VTI:  0.14 m                            Systemic Diam: 2.10 cm Nona Dell MD Electronically signed by Nona Dell MD Signature Date/Time: 09/09/2020/12:40:59 PM    Final    VAS [place] LOWER EXTREMITY VENOUS (DVT) ([facility] and [facility] 7a-7p)  Result Date: 08/24/2020  Lower Venous DVTStudy Performing Technologist: Jannet Askew RCT RDMS  Examination Guidelines: A complete evaluation includes B-mode imaging, spectral Doppler, color Doppler, and power Doppler as needed of all accessible portions of each vessel. Bilateral testing is considered an integral part of a complete examination. Limited examinations for reoccurring indications may be performed as noted. The reflux portion of the exam is performed with the patient in reverse Trendelenburg.  +---------+---------------+---------+-----------+----------+--------------+ RIGHT    CompressibilityPhasicitySpontaneityPropertiesThrombus Aging +---------+---------------+---------+-----------+----------+--------------+ CFV      Full  Yes      Yes                                 +---------+---------------+---------+-----------+----------+--------------+ SFJ      Full                                                         +---------+---------------+---------+-----------+----------+--------------+ FV Prox  Full                                                        +---------+---------------+---------+-----------+----------+--------------+ FV Mid   Full                                                        +---------+---------------+---------+-----------+----------+--------------+ FV DistalFull                                                        +---------+---------------+---------+-----------+----------+--------------+ PFV      Full                                                        +---------+---------------+---------+-----------+----------+--------------+ POP      Full           Yes      Yes                                 +---------+---------------+---------+-----------+----------+--------------+ PTV      Full                                                        +---------+---------------+---------+-----------+----------+--------------+ PERO     Full                                                        +---------+---------------+---------+-----------+----------+--------------+   +---------+---------------+---------+-----------+----------+--------------+ LEFT     CompressibilityPhasicitySpontaneityPropertiesThrombus Aging +---------+---------------+---------+-----------+----------+--------------+ CFV      Full           Yes      Yes                                 +---------+---------------+---------+-----------+----------+--------------+ SFJ  Full                                                        +---------+---------------+---------+-----------+----------+--------------+ FV Prox  Full                                                        +---------+---------------+---------+-----------+----------+--------------+ FV Mid   Full                                                         +---------+---------------+---------+-----------+----------+--------------+ FV DistalFull                                                        +---------+---------------+---------+-----------+----------+--------------+ PFV      Full                                                        +---------+---------------+---------+-----------+----------+--------------+ POP      Full           Yes      Yes                                 +---------+---------------+---------+-----------+----------+--------------+ PTV      Full                                                        +---------+---------------+---------+-----------+----------+--------------+ PERO     Full                                                        +---------+---------------+---------+-----------+----------+--------------+     Summary: BILATERAL: - No evidence of deep vein thrombosis seen in the lower extremities, bilaterally. -   *See table(s) above for measurements and observations. Electronically signed by Lemar Livings MD on 08/24/2020 at 4:10:35 PM.    Final        Subjective: Patient seen and examined at bedside.  Feels better and wants to go home today.  Denies any chest pain.  No overnight fever or vomiting reported.    Discharge Exam: Vitals:   09/11/20 0531 09/11/20 1304  BP: (!) 124/94 (!) 139/121  Pulse: 95 86  Resp: 18 (!) 22  Temp: (!) 97.4 F (36.3 C) 98 F (36.7 C)  SpO2: 97% 98%    General exam: No distress.  Very poor historian. Respiratory system: Bilateral decreased breath sounds at bases with basilar crackles cardiovascular system: S1-S2 heard, rate controlled gastrointestinal system: Abdomen is nondistended, soft and nontender.  Normal bowel sounds heard  extremities: No cyanosis.  Bilateral lower extremity edema present, improving   The results of significant diagnostics from this hospitalization (including imaging, microbiology, ancillary and laboratory)  are listed below for reference.     Microbiology: Recent Results (from the past 240 hour(s))  Respiratory Panel by RT PCR (Flu A&B, Covid) - Nasopharyngeal Swab     Status: None   Collection Time: 09/08/20 11:26 AM   Specimen: Nasopharyngeal Swab  Result Value Ref Range Status   SARS Coronavirus 2 by RT PCR NEGATIVE NEGATIVE Final    Comment: (NOTE) SARS-CoV-2 target nucleic acids are NOT DETECTED.  The SARS-CoV-2 RNA is generally detectable in upper respiratoy specimens during the acute phase of infection. The lowest concentration of SARS-CoV-2 viral copies this assay can detect is 131 copies/mL. A negative result does not preclude SARS-Cov-2 infection and should not be used as the sole basis for treatment or other patient management decisions. A negative result may occur with  improper specimen collection/handling, submission of specimen other than nasopharyngeal swab, presence of viral mutation(s) within the areas targeted by this assay, and inadequate number of viral copies (<131 copies/mL). A negative result must be combined with clinical observations, patient history, and epidemiological information. The expected result is Negative.  Fact Sheet for Patients:  https://www.moore.com/  Fact Sheet for Healthcare Providers:  https://www.young.biz/  This test is no t yet approved or cleared by the [place] FDA and  has been authorized for detection and/or diagnosis of SARS-CoV-2 by FDA under an Emergency Use Authorization (EUA). This EUA will remain  in effect (meaning this test can be used) for the duration of the COVID-19 declaration under Section 564(b)(1) of the Act, 21 U.S.C. section 360bbb-3(b)(1), unless the authorization is terminated or revoked sooner.     Influenza A by PCR NEGATIVE NEGATIVE Final   Influenza B by PCR NEGATIVE NEGATIVE Final    Comment: (NOTE) The Xpert Xpress SARS-CoV-2/FLU/RSV assay is intended as an  aid in  the diagnosis of influenza from Nasopharyngeal swab specimens and  should not be used as a sole basis for treatment. Nasal washings and  aspirates are unacceptable for Xpert Xpress SARS-CoV-2/FLU/RSV  testing.  Fact Sheet for Patients: https://www.moore.com/  Fact Sheet for Healthcare Providers: https://www.young.biz/  This test is not yet approved or cleared by the [place] FDA and  has been authorized for detection and/or diagnosis of SARS-CoV-2 by  FDA under an Emergency Use Authorization (EUA). This EUA will remain  in effect (meaning this test can be used) for the duration of the  Covid-19 declaration under Section 564(b)(1) of the Act, 21  U.S.C. section 360bbb-3(b)(1), unless the authorization is  terminated or revoked. Performed at [facility], [address], [place], [place] [address]      Labs: BNP (last 3 results) Recent Labs    08/24/20 0549 08/28/20 0027 09/08/20 1135  BNP 2,551.8* 2,837.4* 2,683.5*   Basic Metabolic Panel: Recent Labs  Lab 09/08/20 1135 09/08/20 1812 09/09/20 0710 09/10/20 0633 09/11/20 0526  NA 139  --  137 138 138  K 3.9  --  3.5 3.5 3.7  CL 108  --  105 105 105  CO2 19*  --  23 24 26   GLUCOSE 110*  --  98 99 94  BUN 36*  --  31* 21* 23*  CREATININE 1.30* 1.47* 1.43* 1.19 1.15  CALCIUM 8.5*  --  8.3* 8.4* 8.4*  MG  --   --   --  2.0 2.1   Liver Function Tests: Recent Labs  Lab 09/09/20 0710  AST 33  ALT 45*  ALKPHOS 56  BILITOT 1.3*  PROT 5.7*  ALBUMIN 3.1*   No results for input(s): LIPASE, AMYLASE in the last 168 hours. No results for input(s): AMMONIA in the last 168 hours. CBC: Recent Labs  Lab 09/08/20 1135 09/08/20 1812 09/09/20 0710  WBC 7.2 6.2 5.7  HGB 12.1* 12.1* 11.2*  HCT 36.7* 39.0 34.2*  MCV 90.0 94.4 90.2  PLT 298 282 252   Cardiac Enzymes: No results for input(s): CKTOTAL, CKMB, CKMBINDEX, TROPONINI in the last 168  hours. BNP: Invalid input(s): POCBNP CBG: No results for input(s): GLUCAP in the last 168 hours. D-Dimer No results for input(s): DDIMER in the last 72 hours. Hgb A1c No results for input(s): HGBA1C in the last 72 hours. Lipid Profile Recent Labs    09/09/20 0710  CHOL 164  HDL 45  LDLCALC 111*  TRIG 38  CHOLHDL 3.6   Thyroid function studies No results for input(s): TSH, T4TOTAL, T3FREE, THYROIDAB in the last 72 hours.  Invalid input(s): FREET3 Anemia work up No results for input(s): VITAMINB12, FOLATE, FERRITIN, TIBC, IRON, RETICCTPCT in the last 72 hours. Urinalysis    Component Value Date/Time   COLORURINE COLORLESS (A) 02/28/2019 1309   APPEARANCEUR CLEAR 02/28/2019 1309   LABSPEC 1.004 (L) 02/28/2019 1309   PHURINE 7.0 02/28/2019 1309   GLUCOSEU NEGATIVE 02/28/2019 1309   HGBUR NEGATIVE 02/28/2019 1309   BILIRUBINUR NEGATIVE 02/28/2019 1309   KETONESUR NEGATIVE 02/28/2019 1309   PROTEINUR NEGATIVE 02/28/2019 1309   UROBILINOGEN 0.2 03/11/2016 0934   NITRITE NEGATIVE 02/28/2019 1309   LEUKOCYTESUR NEGATIVE 02/28/2019 1309   Sepsis Labs Invalid input(s): PROCALCITONIN,  WBC,  LACTICIDVEN Microbiology Recent Results (from the past 240 hour(s))  Respiratory Panel by RT PCR (Flu A&B, Covid) - Nasopharyngeal Swab     Status: None   Collection Time: 09/08/20 11:26 AM   Specimen: Nasopharyngeal Swab  Result Value Ref Range Status   SARS Coronavirus 2 by RT PCR NEGATIVE NEGATIVE Final    Comment: (NOTE) SARS-CoV-2 target nucleic acids are NOT DETECTED.  The SARS-CoV-2 RNA is generally detectable in upper respiratoy specimens during the acute phase of infection. The lowest concentration of SARS-CoV-2 viral copies this assay can detect is 131 copies/mL. A negative result does not preclude SARS-Cov-2 infection and should not be used as the sole basis for treatment or other patient management decisions. A negative result may occur with  improper specimen  collection/handling, submission of specimen other than nasopharyngeal swab, presence of viral mutation(s) within the areas targeted by this assay, and inadequate number of viral copies (<131 copies/mL). A negative result must be combined with clinical observations, patient history, and epidemiological information. The expected result is Negative.  Fact Sheet for Patients:  https://www.moore.com/  Fact Sheet for Healthcare Providers:  https://www.young.biz/  This test is no t yet approved or cleared by the [place] FDA and  has been authorized for detection and/or diagnosis of SARS-CoV-2 by FDA under an Emergency Use Authorization (EUA). This EUA will remain  in effect (meaning this test can be used) for the duration of the COVID-19 declaration under Section 564(b)(1) of the Act, 21 U.S.C. section 360bbb-3(b)(1), unless the  authorization is terminated or revoked sooner.     Influenza A by PCR NEGATIVE NEGATIVE Final   Influenza B by PCR NEGATIVE NEGATIVE Final    Comment: (NOTE) The Xpert Xpress SARS-CoV-2/FLU/RSV assay is intended as an aid in  the diagnosis of influenza from Nasopharyngeal swab specimens and  should not be used as a sole basis for treatment. Nasal washings and  aspirates are unacceptable for Xpert Xpress SARS-CoV-2/FLU/RSV  testing.  Fact Sheet for Patients: https://www.moore.com/  Fact Sheet for Healthcare Providers: https://www.young.biz/  This test is not yet approved or cleared by the [place] FDA and  has been authorized for detection and/or diagnosis of SARS-CoV-2 by  FDA under an Emergency Use Authorization (EUA). This EUA will remain  in effect (meaning this test can be used) for the duration of the  Covid-19 declaration under Section 564(b)(1) of the Act, 21  U.S.C. section 360bbb-3(b)(1), unless the authorization is  terminated or revoked. Performed at [facility], [address] [place], [place] [address]      Time coordinating discharge: 35 minutes  SIGNED:   Glade Lloyd, MD  [facility] 09/11/2020, 2:59 PM

## 2020-09-11 NOTE — TOC Transition Note (Addendum)
Transition of Care [facility]) - CM/SW Discharge Note   Patient Details  Name: Dale [name] MRN: [id number] Date of Birth: 27-Feb-1961  [facility] of [facility] CM/SW Contact:  Lanier Clam, RN Phone Number: 09/11/2020, 10:25 AM   Clinical Narrative: CM provided info for resources on his insurance plan-Case manager to asst w/meds-he has an obligated co pay-hospital cannot provide money to pay for his meds-he can talk to member services/or rapid response listed in the d/c instructions for patient. Also provided w/substance abuse resources for otpt assistance-patient voiced understanding. No further CM needs.     4p-Provide nurse w/bus pass for patient.  Final next level of care: Home/Self Care Barriers to Discharge: No Barriers Identified   Patient Goals and CMS Choice Patient states their goals for this hospitalization and ongoing recovery are:: go home CMS Medicare.gov Compare Post Acute Care list provided to:: Patient Choice offered to / list presented to : Patient  Discharge Placement                       Discharge Plan and Services   Discharge Planning Services: CM Consult Post Acute Care Choice: NA (otpt resources)                               Social Determinants of Health (SDOH) Interventions     Readmission Risk Interventions Readmission Risk Prevention Plan 09/11/2020 03/03/2019  Transportation Screening Complete Complete  PCP or Specialist Appt within 3-5 Days Complete -  HRI or Home Care Consult Complete -  Social Work Consult for Recovery Care Planning/Counseling Complete -  Palliative Care Screening Complete -  Medication Review Oceanographer) Complete Complete  PCP or Specialist appointment within 3-5 days of discharge - Complete  HRI or Home Care Consult - (No Data)  SW Recovery Care/Counseling Consult - Complete  Palliative Care Screening - Not Applicable  Skilled Nursing Facility - Not Applicable  Some recent data might be hidden

## 2020-09-11 NOTE — Progress Notes (Signed)
Patient ID: Dale [name], male   DOB: 07-Oct-1961, 59 y.o.   MRN: [id number]  PROGRESS NOTE    Dale [name]  OYD:[id number] DOB: [birth year] DOA: 09/08/2020 PCP: Grayce Sessions, NP   Brief Narrative:  59 year old male with history of hypertension, chronic systolic/diastolic heart failure, cocaine abuse, noncompliance presented with dyspnea and bilateral lower extremity edema.  On presentation, chest x-ray showed pulmonary vascular congestion.  He was started on IV Lasix and cardiology was consulted.  Assessment & Plan:   Acute on chronic combined CHF Noncompliance to medications -Currently on IV Lasix and diuresing well.  Cardiology following.  Continue isosorbide, Coreg and hydralazine. Strict input and output.  Daily weights.  Fluid restriction. Negative balance of 8730 cc since admission. -Echo done during this admission shows EF of 20% with grade 3 diastolic dysfunction -Still volume overloaded.  Hypertension -Antihypertensives as above.  Monitor blood pressure  Chronic normocytic anemia -Possibly from heart failure.  Hemoglobin stable.  Non-anion gap metabolic acidosis -Improved  Polysubstance abuse with cocaine and marijuana -Child psychotherapist consult.  Counseled regarding abstinence  CAD -Was previously on aspirin and Plavix but currently not taking these.  Continue aspirin and statin.  Follow cardiology recommendations  Hyperlipidemia -Continue statin  DVT prophylaxis: Lovenox Code Status: Full Family Communication: None at bedside Disposition Plan: Status is: Inpatient  Remains inpatient appropriate because:Inpatient level of care appropriate due to severity of illness.  Still volume overloaded requiring IV diuretics   Dispo: The patient is from: Home              Anticipated d/c is to: Home              Anticipated d/c date is: 1 day              Patient currently is not medically stable to d/c.  Consultants: Cardiology  Procedures: Echo as  below  Antimicrobials: None   Subjective: Patient seen and examined at bedside.  Feels better and wants to go home today.  Denies any chest pain.  No overnight fever or vomiting reported.   Objective: Vitals:   09/10/20 0528 09/10/20 1446 09/10/20 2215 09/11/20 0531  BP: (!) 127/96 119/76 125/90 (!) 124/94  Pulse: 94 94 92 95  Resp: 20 18 20 18   Temp: 97.7 F (36.5 C) 98.2 F (36.8 C) (!) 97.5 F (36.4 C) (!) 97.4 F (36.3 C)  TempSrc: Oral Oral Oral Oral  SpO2: 98% 96% 92% 97%  Weight: 119.7 kg     Height:        Intake/Output Summary (Last 24 hours) at 09/11/2020 0744 Last data filed at 09/11/2020 0126 Gross per 24 hour  Intake 240 ml  Output 4450 ml  Net -4210 ml   Filed Weights   09/08/20 1137 09/09/20 0700 09/10/20 0528  Weight: 117.9 kg 123.5 kg 119.7 kg    Examination:  General exam: No distress.  Very poor historian. Respiratory system: Bilateral decreased breath sounds at bases with basilar crackles cardiovascular system: S1-S2 heard, rate controlled gastrointestinal system: Abdomen is nondistended, soft and nontender.  Normal bowel sounds heard  extremities: No cyanosis.  Bilateral lower extremity edema present   Data Reviewed: I have personally reviewed following labs and imaging studies  CBC: Recent Labs  Lab 09/08/20 1135 09/08/20 1812 09/09/20 0710  WBC 7.2 6.2 5.7  HGB 12.1* 12.1* 11.2*  HCT 36.7* 39.0 34.2*  MCV 90.0 94.4 90.2  PLT 298 282 252   Basic Metabolic Panel: Recent Labs  Lab  09/08/20 1135 09/08/20 1812 09/09/20 0710 09/10/20 0633 09/11/20 0526  NA 139  --  137 138 138  K 3.9  --  3.5 3.5 3.7  CL 108  --  105 105 105  CO2 19*  --  23 24 26   GLUCOSE 110*  --  98 99 94  BUN 36*  --  31* 21* 23*  CREATININE 1.30* 1.47* 1.43* 1.19 1.15  CALCIUM 8.5*  --  8.3* 8.4* 8.4*  MG  --   --   --  2.0 2.1   GFR: Estimated Creatinine Clearance: 93.3 mL/min (by C-G formula based on SCr of 1.15 mg/dL). Liver Function Tests: Recent  Labs  Lab 09/09/20 0710  AST 33  ALT 45*  ALKPHOS 56  BILITOT 1.3*  PROT 5.7*  ALBUMIN 3.1*   No results for input(s): LIPASE, AMYLASE in the last 168 hours. No results for input(s): AMMONIA in the last 168 hours. Coagulation Profile: No results for input(s): INR, PROTIME in the last 168 hours. Cardiac Enzymes: No results for input(s): CKTOTAL, CKMB, CKMBINDEX, TROPONINI in the last 168 hours. BNP (last 3 results) No results for input(s): PROBNP in the last 8760 hours. HbA1C: No results for input(s): HGBA1C in the last 72 hours. CBG: No results for input(s): GLUCAP in the last 168 hours. Lipid Profile: Recent Labs    09/09/20 0710  CHOL 164  HDL 45  LDLCALC 111*  TRIG 38  CHOLHDL 3.6   Thyroid Function Tests: No results for input(s): TSH, T4TOTAL, FREET4, T3FREE, THYROIDAB in the last 72 hours. Anemia Panel: No results for input(s): VITAMINB12, FOLATE, FERRITIN, TIBC, IRON, RETICCTPCT in the last 72 hours. Sepsis Labs: Recent Labs  Lab 09/08/20 1812 09/08/20 1935  LATICACIDVEN 2.3* 1.9    Recent Results (from the past 240 hour(s))  Respiratory Panel by RT PCR (Flu A&B, Covid) - Nasopharyngeal Swab     Status: None   Collection Time: 09/08/20 11:26 AM   Specimen: Nasopharyngeal Swab  Result Value Ref Range Status   SARS Coronavirus 2 by RT PCR NEGATIVE NEGATIVE Final    Comment: (NOTE) SARS-CoV-2 target nucleic acids are NOT DETECTED.  The SARS-CoV-2 RNA is generally detectable in upper respiratoy specimens during the acute phase of infection. The lowest concentration of SARS-CoV-2 viral copies this assay can detect is 131 copies/mL. A negative result does not preclude SARS-Cov-2 infection and should not be used as the sole basis for treatment or other patient management decisions. A negative result may occur with  improper specimen collection/handling, submission of specimen other than nasopharyngeal swab, presence of viral mutation(s) within the areas  targeted by this assay, and inadequate number of viral copies (<131 copies/mL). A negative result must be combined with clinical observations, patient history, and epidemiological information. The expected result is Negative.  Fact Sheet for Patients:  https://www.moore.com/  Fact Sheet for Healthcare Providers:  https://www.young.biz/  This test is no t yet approved or cleared by the [place] FDA and  has been authorized for detection and/or diagnosis of SARS-CoV-2 by FDA under an Emergency Use Authorization (EUA). This EUA will remain  in effect (meaning this test can be used) for the duration of the COVID-19 declaration under Section 564(b)(1) of the Act, 21 U.S.C. section 360bbb-3(b)(1), unless the authorization is terminated or revoked sooner.     Influenza A by PCR NEGATIVE NEGATIVE Final   Influenza B by PCR NEGATIVE NEGATIVE Final    Comment: (NOTE) The Xpert Xpress SARS-CoV-2/FLU/RSV assay is intended as an  aid in  the diagnosis of influenza from Nasopharyngeal swab specimens and  should not be used as a sole basis for treatment. Nasal washings and  aspirates are unacceptable for Xpert Xpress SARS-CoV-2/FLU/RSV  testing.  Fact Sheet for Patients: https://www.moore.com/  Fact Sheet for Healthcare Providers: https://www.young.biz/  This test is not yet approved or cleared by the [place] FDA and  has been authorized for detection and/or diagnosis of SARS-CoV-2 by  FDA under an Emergency Use Authorization (EUA). This EUA will remain  in effect (meaning this test can be used) for the duration of the  Covid-19 declaration under Section 564(b)(1) of the Act, 21  U.S.C. section 360bbb-3(b)(1), unless the authorization is  terminated or revoked. Performed at [facility], [address], [place], [place] [address]          Radiology Studies: ECHOCARDIOGRAM  COMPLETE  Result Date: 09/09/2020    ECHOCARDIOGRAM REPORT   Patient Name:   KAILEN HINKLE Date of Exam: 09/09/2020 Medical Rec #:  [id number]     Height:       71.0 in Accession #:    [id number]    Weight:       260.0 lb Date of Birth:  06/25/1961     BSA:          2.357 m Patient Age:    59 years      BP:           117/89 mmHg Patient Gender: M             HR:           85 bpm. Exam Location:  Inpatient Procedure: 2D Echo and Intracardiac Opacification Agent Indications:    CHF-Acute Systolic 428.21 / I50.21  History:        Patient has prior history of Echocardiogram examinations, most                 recent 03/11/2019. CAD, Signs/Symptoms:Shortness of Breath; Risk                 Factors:Hypertension and Dyslipidemia. Lower extremity edema.                 Substance abuse.  Sonographer:    Leta Jungling RDCS Referring Phys: [id number] Teddy Spike IMPRESSIONS  1. Left ventricular ejection fraction, by estimation, is approximately 20%. The left ventricle has severely decreased function. The left ventricle demonstrates global hypokinesis with regional variation as noted. The left ventricular internal cavity size was moderately dilated. Left ventricular diastolic parameters are consistent with Grade III diastolic dysfunction (restrictive). No obvious mural thrombus noted with Definity contrast.  2. Right ventricular systolic function is moderately to severely reduced. The right ventricular size is normal. There is moderately elevated pulmonary artery systolic pressure. The estimated right ventricular systolic pressure is 52.0 mmHg.  3. Left atrial size was mild to moderately dilated.  4. Right atrial size was mild to moderately dilated.  5. The mitral valve is grossly normal. Mild mitral valve regurgitation.  6. The aortic valve is tricuspid. Aortic valve regurgitation is trivial.  7. The inferior vena cava is dilated in size with <50% respiratory variability, suggesting right atrial pressure of 15 mmHg. FINDINGS   Left Ventricle: Left ventricular ejection fraction, by estimation, is 20%. The left ventricle has severely decreased function. The left ventricle demonstrates regional wall motion abnormalities. Definity contrast agent was given IV to delineate the left  ventricular endocardial borders. The left ventricular internal cavity  size was moderately dilated. There is borderline left ventricular hypertrophy. Left ventricular diastolic parameters are consistent with Grade III diastolic dysfunction (restrictive).  LV Wall Scoring: The anterior septum, inferior wall, and mid inferoseptal segment are akinetic. The entire anterior wall, entire lateral wall, entire apex, and basal inferoseptal segment are hypokinetic. Right Ventricle: The right ventricular size is normal. No increase in right ventricular wall thickness. Right ventricular systolic function is severely reduced. There is moderately elevated pulmonary artery systolic pressure. The tricuspid regurgitant velocity is 3.04 m/s, and with an assumed right atrial pressure of 15 mmHg, the estimated right ventricular systolic pressure is 52.0 mmHg. Left Atrium: Left atrial size was mild to moderately dilated. Right Atrium: Right atrial size was mild to moderately dilated. Pericardium: There is no evidence of pericardial effusion. Mitral Valve: The mitral valve is grossly normal. Mild mitral valve regurgitation. Tricuspid Valve: The tricuspid valve is grossly normal. Tricuspid valve regurgitation is mild. Aortic Valve: The aortic valve is tricuspid. Aortic valve regurgitation is trivial. Pulmonic Valve: The pulmonic valve was grossly normal. Pulmonic valve regurgitation is mild. Aorta: The aortic root is normal in size and structure. Venous: The inferior vena cava is dilated in size with less than 50% respiratory variability, suggesting right atrial pressure of 15 mmHg. IAS/Shunts: No atrial level shunt detected by color flow Doppler.  LEFT VENTRICLE PLAX 2D LVIDd:          6.60 cm LVIDs:         6.50 cm LV PW:         1.10 cm LV IVS:        1.00 cm LVOT diam:     2.10 cm LV SV:         47 LV SV Index:   20 LVOT Area:     3.46 cm  LV Volumes (MOD) LV vol d, MOD A2C: 424.0 ml LV vol d, MOD A4C: 271.0 ml LV vol s, MOD A2C: 314.0 ml LV vol s, MOD A4C: 224.0 ml LV SV MOD A2C:     110.0 ml LV SV MOD A4C:     271.0 ml LV SV MOD BP:      78.2 ml RIGHT VENTRICLE RV S prime:     8.67 cm/s TAPSE (M-mode): 1.0 cm LEFT ATRIUM              Index       RIGHT ATRIUM           Index LA diam:        4.40 cm  1.87 cm/m  RA Area:     28.20 cm LA Vol (A2C):   103.0 ml 43.69 ml/m RA Volume:   102.00 ml 43.27 ml/m LA Vol (A4C):   83.2 ml  35.29 ml/m LA Biplane Vol: 94.4 ml  40.05 ml/m  AORTIC VALVE LVOT Vmax:   92.70 cm/s LVOT Vmean:  64.100 cm/s LVOT VTI:    0.136 m  AORTA Ao Root diam: 3.70 cm Ao Asc diam:  3.50 cm MITRAL VALVE               TRICUSPID VALVE MV Area (PHT): 7.82 cm    TR Peak grad:   37.0 mmHg MV Decel Time: 97 msec     TR Vmax:        304.00 cm/s MV E velocity: 97.70 cm/s MV A velocity: 47.60 cm/s  SHUNTS MV E/A ratio:  2.05        Systemic VTI:  0.14 m  Systemic Diam: 2.10 cm Nona Dell MD Electronically signed by Nona Dell MD Signature Date/Time: 09/09/2020/12:40:59 PM    Final         Scheduled Meds: . aspirin EC  81 mg Oral Daily  . atorvastatin  40 mg Oral Daily  . carvedilol  3.125 mg Oral BID WC  . enoxaparin (LOVENOX) injection  60 mg Subcutaneous Q24H  . furosemide  40 mg Intravenous BID  . isosorbide-hydrALAZINE  1 tablet Oral TID   Continuous Infusions:        Glade Lloyd, MD [facility] 09/11/2020, 7:44 AM

## 2020-09-11 NOTE — Discharge Instructions (Signed)
Resources For insurance assistance    Member [phone] Case manager in your plan   Rapid Response [phone] Case Management information

## 2020-09-11 NOTE — Progress Notes (Signed)
Progress Note  Patient Name: Dale [name] Date of Encounter: 09/11/2020  [facility] Cardiologist: Kristeen Miss, MD   Subjective   Patient stating he is ready to walk out of the hospital. He states that he can't be staying in the hospital when he needs to go home and make money. He feels he is back to his baseline. Denies chest pain or shortness of breath.  Inpatient Medications    Scheduled Meds: . aspirin EC  81 mg Oral Daily  . atorvastatin  40 mg Oral Daily  . carvedilol  3.125 mg Oral BID WC  . enoxaparin (LOVENOX) injection  60 mg Subcutaneous Q24H  . furosemide  40 mg Intravenous BID  . isosorbide-hydrALAZINE  1 tablet Oral TID   Continuous Infusions:  PRN Meds: acetaminophen **OR** acetaminophen, ondansetron **OR** ondansetron (ZOFRAN) IV   Vital Signs    Vitals:   09/10/20 1446 09/10/20 2215 09/11/20 0531 09/11/20 1304  BP: 119/76 125/90 (!) 124/94 (!) 139/121  Pulse: 94 92 95 86  Resp: 18 20 18  (!) 22  Temp: 98.2 F (36.8 C) (!) 97.5 F (36.4 C) (!) 97.4 F (36.3 C) 98 F (36.7 C)  TempSrc: Oral Oral Oral Oral  SpO2: 96% 92% 97% 98%  Weight:      Height:        Intake/Output Summary (Last 24 hours) at 09/11/2020 1414 Last data filed at 09/11/2020 1300 Gross per 24 hour  Intake 236 ml  Output 2850 ml  Net -2614 ml   Last 3 Weights 09/10/2020 09/09/2020 09/08/2020  Weight (lbs) 263 lb 14.4 oz 272 lb 4.3 oz 260 lb  Weight (kg) 119.704 kg 123.5 kg 117.935 kg      Telemetry    Sinus with 14 beats NSVT 11/8 at 5:11 AM and 3 beats NSVT 11/8 10:32 AM - Personally Reviewed  ECG    No new since 11/5 - Personally Reviewed  Physical Exam   GEN: No acute distress.   Neck: No JVD visible sitting upright. Cardiac: RRR, no murmurs, rubs, or gallops.  Respiratory: Rales at bilateral bases GI: Soft, nontender, non-distended  MS: Bilateral trace LE edema; No deformity. Neuro:  Nonfocal  Psych: Normal affect   Labs    High Sensitivity Troponin:     Recent Labs  Lab 09/08/20 1135 09/08/20 1327  TROPONINIHS 32* 28*      Chemistry Recent Labs  Lab 09/09/20 0710 09/10/20 0633 09/11/20 0526  NA 137 138 138  K 3.5 3.5 3.7  CL 105 105 105  CO2 23 24 26   GLUCOSE 98 99 94  BUN 31* 21* 23*  CREATININE 1.43* 1.19 1.15  CALCIUM 8.3* 8.4* 8.4*  PROT 5.7*  --   --   ALBUMIN 3.1*  --   --   AST 33  --   --   ALT 45*  --   --   ALKPHOS 56  --   --   BILITOT 1.3*  --   --   GFRNONAA 57* >60 >60  ANIONGAP 9 9 7      Hematology Recent Labs  Lab 09/08/20 1135 09/08/20 1812 09/09/20 0710  WBC 7.2 6.2 5.7  RBC 4.08* 4.13* 3.79*  HGB 12.1* 12.1* 11.2*  HCT 36.7* 39.0 34.2*  MCV 90.0 94.4 90.2  MCH 29.7 29.3 29.6  MCHC 33.0 31.0 32.7  RDW 15.2 15.4 15.2  PLT 298 282 252    BNP Recent Labs  Lab 09/08/20 1135  BNP 2,683.5*  DDimer No results for input(s): DDIMER in the last 168 hours.   Radiology    No results found.  Cardiac Studies   03/2019 echo LVEF 20-25%, restrictive DDx,  05/2019 cath: RCA CTO managed medically. CI 3.36 mean PA 20 PCWP 18 LVEDP 27 09/2020 echo LVEF 20%, grade III DDx, mod to severe RV dysfunction, dilated IVC  Patient Profile     59 y.o. male with a PMH of CAD, chronic combined systolic and diastolic heart failure, HTN, HLD, and cocaine abuse,who is being seen today for the evaluation of acute on chronic systolic and diastolic heart failureat the request of Dr. Ronaldo Miyamoto  Assessment & Plan    Acute on chronic systolic and diastolic heart failure -suspect this was triggered by nonadherence -admission weight 117.9 kg, peak weight 123.5 kg, weight today not documented. Charted as net negative 9L since admission, but minimal intake charted. -renal function improved with diuresis, Cr 1.15 today. -troponins low and flat, consistent with demand ischemia -continue medical therapy with isordil-hydralazine -started on carvedilol 11/7 given NSVT, monitor carefully given history of cocaine  use -he does have continued volume overload based on exam today. However, he is adamant that he wants to go home today. I will leave recommendations based on this  CAD: with CTO of RCA, with collaterals.  -continue aspirin, statin  NSVT: -started on carvedilol 11/7. Tolerating well. Reviewed risk of beta blocker and cocaine. Can increase to prior home dosing today. -keep K >4, Mg >2 -two brief episodes.  Overall I counseled him that I do not think he is yet euvolemic. Continues to have brief but intermittent NSVT. Discussed that the safest course would be to continue inpatient diuresis and monitoring. However, he states he cannot do this and instead would like to be discharged today.  [facility] will sign off.   Medication Recommendations:  Continue: Aspirin 81 mg daily Atorvastatin 80 mg daily Carvedilol 12.5 mg BID Isosorbide-hydralazine 20-37.5 mg TID Spironolactone 25 mg daily Torsemide 20 mg daily  Stop: Ok to stop clopidogrel (last seen in clinic in 05/2019, post cath. Has not been taking prior to admission)  Other recommendations (labs, testing, etc):  Will need BMET at follow up Follow up as an outpatient:  We will arrange for follow up with Dr. Elease Hashimoto or a member of his team.  He is at high risk for readmission, would aim to get him 30 days of medication prior to discharge. Discussed with Dr. Hanley Ben.  For questions or updates, please contact [facility] Please consult [email or web address] for contact info under     Signed, Jodelle Red, MD  09/11/2020, 2:14 PM

## 2020-09-11 NOTE — Progress Notes (Signed)
Pt had 14 beat run of Vtach. Vitals stable, NAD. MD notified. Will continue to monitor.

## 2020-09-12 ENCOUNTER — Other Ambulatory Visit (INDEPENDENT_AMBULATORY_CARE_PROVIDER_SITE_OTHER): Payer: Self-pay | Admitting: Primary Care

## 2020-09-12 ENCOUNTER — Other Ambulatory Visit: Payer: Self-pay | Admitting: Physician Assistant

## 2020-09-12 ENCOUNTER — Telehealth: Payer: Self-pay

## 2020-09-12 NOTE — Telephone Encounter (Signed)
Transition Care Management Follow-up Telephone Call  Date of discharge and from where: 09/11/2020, [facility]  How have you been since you were released from the hospital? He voiced concerns about not being able to pick up all of the medications.   Any questions or concerns? Yes - noted above regarding medications.    Items Reviewed:  Did the pt receive and understand the discharge instructions provided? this was not addressed. he was focued on getting all of his medications.   Medications obtained and verified? he said that he only was able to pick up the aspirin, bidil and spironolactone.  he said that the pharmacy was not able to fill the others.  He was upset that all medications were not ordered when he was discharged.  Instructed him to contact the cardiologist about this issue.  he has not been seen by PCP in over a year.   Other? No   Any new allergies since your discharge? No   Do you have support at home? not addressed.  he was anxious to get off of the phone and contact cardiology.   Home Care and Equipment/Supplies: Were home health services ordered? No If so, what is the name of the agency? n/a Has the agency set up a time to come to the patient's home? n/a Were any new equipment or medical supplies ordered?  No What is the name of the medical supply agency? n/a Were you able to get the supplies/equipment? N/a Do you have any questions related to the use of the equipment or supplies?n/a  Functional Questionnaire: (I = Independent and D = Dependent) ADLs: not addressed.  He wanted to get off of the phone to call cardiology  Follow up appointments reviewed:   PCP Hospital f/u appt confirmed? No  - not scheduled yet.  Informed him that his PCP, is not in network with his insurance. He can contact his insurer to inquire who is in network of if he is able to change health plans if desired.   Specialist Hospital f/u appt confirmed? Yes  - Dr Elease Hashimoto - 09/25/2020  .  Are transportation arrangements needed? Not addressed  If their condition worsens, is the pt aware to call PCP or go to the Emergency Dept.? he hung up prior to discussing this issue  Was the patient provided with contact information for the PCP's office or ED?he hung up prior to discussing.   Was to pt encouraged to call back with questions or concerns?he hung up prior to discussing,

## 2020-09-19 LAB — DRUG PROFILE, UR, 9 DRUGS (LABCORP)
Amphetamines, Urine: NEGATIVE ng/mL
Barbiturate, Ur: NEGATIVE ng/mL
Benzodiazepine Quant, Ur: NEGATIVE ng/mL
Cannabinoid Quant, Ur: POSITIVE ng/mL — AB
Cocaine (Metab.): POSITIVE ng/mL — AB
Methadone Screen, Urine: NEGATIVE ng/mL
Opiate Quant, Ur: NEGATIVE ng/mL
Phencyclidine, Ur: NEGATIVE ng/mL
Propoxyphene, Urine: NEGATIVE ng/mL

## 2020-09-24 ENCOUNTER — Encounter: Payer: Self-pay | Admitting: Cardiovascular Disease

## 2020-09-24 NOTE — Progress Notes (Signed)
This encounter was created in error - please disregard.

## 2020-09-25 ENCOUNTER — Encounter: Payer: Medicaid Other | Admitting: Cardiovascular Disease

## 2020-10-02 ENCOUNTER — Ambulatory Visit: Payer: Medicaid Other | Admitting: Cardiovascular Disease

## 2020-10-06 ENCOUNTER — Encounter: Payer: Self-pay | Admitting: General Practice

## 2020-10-15 ENCOUNTER — Inpatient Hospital Stay (HOSPITAL_COMMUNITY): Payer: Medicaid Other

## 2020-10-15 ENCOUNTER — Emergency Department (HOSPITAL_COMMUNITY): Payer: Medicaid Other

## 2020-10-15 ENCOUNTER — Inpatient Hospital Stay (HOSPITAL_COMMUNITY)
Admission: EM | Admit: 2020-10-15 | Discharge: 2020-10-21 | DRG: 291 | Disposition: A | Discharge status: Home / Self Care | Payer: Medicaid Other | Attending: Internal Medicine | Admitting: Internal Medicine

## 2020-10-15 DIAGNOSIS — I2510 Atherosclerotic heart disease of native coronary artery without angina pectoris: Secondary | ICD-10-CM | POA: Diagnosis present

## 2020-10-15 DIAGNOSIS — F1410 Cocaine abuse, uncomplicated: Secondary | ICD-10-CM | POA: Diagnosis not present

## 2020-10-15 DIAGNOSIS — M79652 Pain in left thigh: Secondary | ICD-10-CM | POA: Diagnosis present

## 2020-10-15 DIAGNOSIS — I441 Atrioventricular block, second degree: Secondary | ICD-10-CM | POA: Diagnosis not present

## 2020-10-15 DIAGNOSIS — M1712 Unilateral primary osteoarthritis, left knee: Secondary | ICD-10-CM | POA: Diagnosis present

## 2020-10-15 DIAGNOSIS — Z7982 Long term (current) use of aspirin: Secondary | ICD-10-CM

## 2020-10-15 DIAGNOSIS — R059 Cough, unspecified: Secondary | ICD-10-CM | POA: Diagnosis present

## 2020-10-15 DIAGNOSIS — M7989 Other specified soft tissue disorders: Secondary | ICD-10-CM | POA: Diagnosis not present

## 2020-10-15 DIAGNOSIS — I472 Ventricular tachycardia: Secondary | ICD-10-CM | POA: Diagnosis not present

## 2020-10-15 DIAGNOSIS — N179 Acute kidney failure, unspecified: Secondary | ICD-10-CM | POA: Diagnosis present

## 2020-10-15 DIAGNOSIS — I509 Heart failure, unspecified: Secondary | ICD-10-CM

## 2020-10-15 DIAGNOSIS — I255 Ischemic cardiomyopathy: Secondary | ICD-10-CM | POA: Diagnosis present

## 2020-10-15 DIAGNOSIS — R001 Bradycardia, unspecified: Secondary | ICD-10-CM | POA: Diagnosis not present

## 2020-10-15 DIAGNOSIS — Z8249 Family history of ischemic heart disease and other diseases of the circulatory system: Secondary | ICD-10-CM

## 2020-10-15 DIAGNOSIS — F1010 Alcohol abuse, uncomplicated: Secondary | ICD-10-CM | POA: Diagnosis present

## 2020-10-15 DIAGNOSIS — R609 Edema, unspecified: Secondary | ICD-10-CM

## 2020-10-15 DIAGNOSIS — I10 Essential (primary) hypertension: Secondary | ICD-10-CM | POA: Diagnosis present

## 2020-10-15 DIAGNOSIS — N1831 Chronic kidney disease, stage 3a: Secondary | ICD-10-CM | POA: Diagnosis present

## 2020-10-15 DIAGNOSIS — Z79899 Other long term (current) drug therapy: Secondary | ICD-10-CM | POA: Diagnosis not present

## 2020-10-15 DIAGNOSIS — Z9114 Patient's other noncompliance with medication regimen: Secondary | ICD-10-CM

## 2020-10-15 DIAGNOSIS — Z20822 Contact with and (suspected) exposure to covid-19: Secondary | ICD-10-CM | POA: Diagnosis present

## 2020-10-15 DIAGNOSIS — N189 Chronic kidney disease, unspecified: Secondary | ICD-10-CM

## 2020-10-15 DIAGNOSIS — E782 Mixed hyperlipidemia: Secondary | ICD-10-CM | POA: Diagnosis not present

## 2020-10-15 DIAGNOSIS — M79659 Pain in unspecified thigh: Secondary | ICD-10-CM

## 2020-10-15 DIAGNOSIS — I130 Hypertensive heart and chronic kidney disease with heart failure and stage 1 through stage 4 chronic kidney disease, or unspecified chronic kidney disease: Secondary | ICD-10-CM | POA: Diagnosis present

## 2020-10-15 DIAGNOSIS — E876 Hypokalemia: Secondary | ICD-10-CM | POA: Diagnosis not present

## 2020-10-15 DIAGNOSIS — K219 Gastro-esophageal reflux disease without esophagitis: Secondary | ICD-10-CM | POA: Diagnosis present

## 2020-10-15 DIAGNOSIS — Z885 Allergy status to narcotic agent status: Secondary | ICD-10-CM | POA: Diagnosis not present

## 2020-10-15 DIAGNOSIS — E785 Hyperlipidemia, unspecified: Secondary | ICD-10-CM | POA: Diagnosis present

## 2020-10-15 DIAGNOSIS — I5043 Acute on chronic combined systolic (congestive) and diastolic (congestive) heart failure: Secondary | ICD-10-CM | POA: Diagnosis present

## 2020-10-15 LAB — CBC WITH DIFFERENTIAL/PLATELET
Abs Immature Granulocytes: 0.02 10*3/uL (ref 0.00–0.07)
Basophils Absolute: 0 10*3/uL (ref 0.0–0.1)
Basophils Relative: 1 %
Eosinophils Absolute: 0.1 10*3/uL (ref 0.0–0.5)
Eosinophils Relative: 1 %
HCT: 38.9 % — ABNORMAL LOW (ref 39.0–52.0)
Hemoglobin: 12.5 g/dL — ABNORMAL LOW (ref 13.0–17.0)
Immature Granulocytes: 0 %
Lymphocytes Relative: 18 %
Lymphs Abs: 1.3 10*3/uL (ref 0.7–4.0)
MCH: 28 pg (ref 26.0–34.0)
MCHC: 32.1 g/dL (ref 30.0–36.0)
MCV: 87 fL (ref 80.0–100.0)
Monocytes Absolute: 0.7 10*3/uL (ref 0.1–1.0)
Monocytes Relative: 9 %
Neutro Abs: 5.3 10*3/uL (ref 1.7–7.7)
Neutrophils Relative %: 71 %
Platelets: 309 10*3/uL (ref 150–400)
RBC: 4.47 MIL/uL (ref 4.22–5.81)
RDW: 15.2 % (ref 11.5–15.5)
WBC: 7.4 10*3/uL (ref 4.0–10.5)
nRBC: 0 % (ref 0.0–0.2)

## 2020-10-15 LAB — BASIC METABOLIC PANEL
Anion gap: 11 (ref 5–15)
BUN: 29 mg/dL — ABNORMAL HIGH (ref 6–20)
CO2: 22 mmol/L (ref 22–32)
Calcium: 8.4 mg/dL — ABNORMAL LOW (ref 8.9–10.3)
Chloride: 106 mmol/L (ref 98–111)
Creatinine, Ser: 1.66 mg/dL — ABNORMAL HIGH (ref 0.61–1.24)
GFR, Estimated: 48 mL/min — ABNORMAL LOW (ref >60)
Glucose, Bld: 112 mg/dL — ABNORMAL HIGH (ref 70–99)
Potassium: 3.5 mmol/L (ref 3.5–5.1)
Sodium: 139 mmol/L (ref 135–145)

## 2020-10-15 LAB — TROPONIN I (HIGH SENSITIVITY)
Troponin I (High Sensitivity): 30 ng/L — ABNORMAL HIGH (ref <18)
Troponin I (High Sensitivity): 31 ng/L — ABNORMAL HIGH (ref <18)

## 2020-10-15 LAB — RESP PANEL BY RT-PCR (FLU A&B, COVID) ARPGX2
Influenza A by PCR: NEGATIVE
Influenza B by PCR: NEGATIVE
SARS Coronavirus 2 by RT PCR: NEGATIVE

## 2020-10-15 LAB — BRAIN NATRIURETIC PEPTIDE: B Natriuretic Peptide: 3415.1 pg/mL — ABNORMAL HIGH (ref 0.0–100.0)

## 2020-10-15 MED ORDER — ENOXAPARIN SODIUM 60 MG/0.6ML ~~LOC~~ SOLN
60.0000 mg | SUBCUTANEOUS | Status: DC
  Administered 2020-10-15 – 2020-10-20 (×6): 60 mg via SUBCUTANEOUS
  Filled 2020-10-15 (×8): qty 0.6

## 2020-10-15 MED ORDER — ONDANSETRON HCL 4 MG PO TABS: 4.0000 mg | ORAL_TABLET | Freq: Four times a day (QID) | ORAL | Status: DC | PRN

## 2020-10-15 MED ORDER — ONDANSETRON HCL 4 MG/2ML IJ SOLN: 4.0000 mg | Freq: Four times a day (QID) | INTRAMUSCULAR | Status: DC | PRN

## 2020-10-15 MED ORDER — FUROSEMIDE 10 MG/ML IJ SOLN
40.0000 mg | Freq: Once | INTRAMUSCULAR | Status: AC
  Administered 2020-10-15: 40 mg via INTRAVENOUS
  Filled 2020-10-15: qty 4

## 2020-10-15 MED ORDER — FUROSEMIDE 10 MG/ML IJ SOLN
40.0000 mg | Freq: Every day | INTRAMUSCULAR | Status: DC
  Administered 2020-10-16 – 2020-10-18 (×3): 40 mg via INTRAVENOUS
  Filled 2020-10-15 (×3): qty 4

## 2020-10-15 MED ORDER — ISOSORB DINITRATE-HYDRALAZINE 20-37.5 MG PO TABS
1.0000 | ORAL_TABLET | Freq: Three times a day (TID) | ORAL | Status: DC
  Administered 2020-10-15 – 2020-10-21 (×19): 1 via ORAL
  Filled 2020-10-15 (×21): qty 1

## 2020-10-15 NOTE — Progress Notes (Signed)
..   Transition of Care [facility]) - Emergency Department Mini Assessment   Patient Details  Name: Dale [name] MRN: [id number] Date of Birth: [birth year]  Transition of Care [facility]) CM/SW Contact:    Lossie Faes Tarpley-Carter, LCSWA Phone Number: 10/15/2020, 12:25 PM   Clinical Narrative: [facility] CM/CSW consulted with pt regarding SNF placement.  Pt stated he would be willing to give up disability check ($761) for a 30 day stay at [facility].    Julieth Tugman Tarpley-Carter, MSW, LCSW-A Pronouns:  She, Her, Hers                  [facility] Transitions of CareClinical Social Worker [email or web address] [phone]     ED Mini Assessment: What brought you to the Emergency Department? : EMS  Barriers to Discharge: No Barriers Identified     Means of departure: Not know  Interventions which prevented an admission or readmission: SNF Placement    Patient Contact and Communications     Spoke with: Lesly Dukes Date: 10/15/20,   Contact time: 1223   Call outcome: Pt may be willing to give up disability check for 30 days to go to SNF.      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 Acute on chronic combined systolic (congestive) and diastolic (congestive) heart failure (HCC)   . Acute CHF (congestive heart failure) (HCC) 05/07/2019  . Nausea & vomiting 05/07/2019  . Acute exacerbation of CHF (congestive heart failure) (HCC) 03/11/2019  . Troponin level elevated 03/11/2019  . Hyperlipidemia 03/03/2019  . NSVT (nonsustained ventricular tachycardia) (HCC) 03/03/2019  . Anxiety and depression   . NSTEMI (non-ST elevated myocardial infarction) (HCC) 02/28/2019  . Acute systolic heart failure (HCC) 11/08/2018   . Hypertensive urgency 09/28/2018  . Acute pulmonary edema (HCC) 09/28/2018  . Cocaine abuse (HCC) 09/28/2018  . Substance abuse (HCC) 09/28/2018  . Chronic kidney disease, stage II (mild) 09/28/2018  . Normochromic anemia 09/28/2018  . Acute systolic CHF (congestive heart failure) (HCC) 09/27/2018  . Essential hypertension 03/11/2016  . Obesity 03/11/2016  . Glucosuria 03/11/2016   PCP:  Grayce Sessions, NP Pharmacy:   [facility] - [place], [place] - [address] [place] [address] Phone: [phone] Fax: [phone]  [facility] [address] [place] - [address]  [place] [address] Phone: [phone] Fax: [phone]

## 2020-10-15 NOTE — ED Triage Notes (Signed)
Patient BIB EMS with chief complaint of SOB, hx of CHF and COPD. Patient arrives in no respiratory distress, lung sounds clear, VSS. Patient ambulatory without assistance, AxOx4, in no distress.

## 2020-10-15 NOTE — ED Provider Notes (Signed)
[facility]-EMERGENCY DEPT Provider Note   CSN: [id number] Arrival date & time: 10/15/20  0503     History Chief Complaint  Patient presents with  . Shortness of Breath    Dale [name] is a 59 y.o. male.  Gregroy [name] is a 59 y.o. male with a history of combined CHF with EF of 20 to 25%, hypertension, cocaine and alcohol abuse, CKD, who presents to the emergency department via EMS for evaluation of shortness of breath.  He reports that his breathing has been worsening over the past few days but got worse in particular overnight.  He reports that he could not lay flat to sleep or get any rest.  He reports that shortness of breath is worse with activity and does seem to improve somewhat with rest.  He denies any associated chest pain.  No cough or fever.  Patient does report some worsening swelling in his legs, and that he has had some pain in particular in the left upper leg and feels that the swelling is worse here. Reports he has been taking his torsemide daily, and ran out of it yesterday, but reports he feels he does not urinate nearly as much when taking the pill, compared to response he gets from IV meds in the hospital.  Patient denies any abdominal pain, nausea or vomiting.  He is not on any blood thinners.  Denies prior history of blood clots.  Does report that he continues to use cocaine and last used last night, denies alcohol or other drug use.        Past Medical History:  Diagnosis Date  . Alcohol abuse   . Chronic combined systolic and diastolic CHF (congestive heart failure) (HCC)    a. 09/27/18 showed mild LVH, EF 20-25%, grade 2 DD, mild MR, severely dilated LA, mildly dilated RV with mildly reduced RV function, mod RAE.  Marland Kitchen Cocaine abuse (HCC)   . Hypertension     Patient Active Problem List   Diagnosis Date Noted  . 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 Acute on chronic combined systolic  (congestive) and diastolic (congestive) heart failure (HCC)   . Acute CHF (congestive heart failure) (HCC) 05/07/2019  . Nausea & vomiting 05/07/2019  . Acute exacerbation of CHF (congestive heart failure) (HCC) 03/11/2019  . Troponin level elevated 03/11/2019  . Hyperlipidemia 03/03/2019  . NSVT (nonsustained ventricular tachycardia) (HCC) 03/03/2019  . Anxiety and depression   . NSTEMI (non-ST elevated myocardial infarction) (HCC) 02/28/2019  . Acute systolic heart failure (HCC) 11/08/2018  . Hypertensive urgency 09/28/2018  . Acute pulmonary edema (HCC) 09/28/2018  . Cocaine abuse (HCC) 09/28/2018  . Substance abuse (HCC) 09/28/2018  . Chronic kidney disease, stage II (mild) 09/28/2018  . Normochromic anemia 09/28/2018  . Acute systolic CHF (congestive heart failure) (HCC) 09/27/2018  . Essential hypertension 03/11/2016  . Obesity 03/11/2016  . Glucosuria 03/11/2016    Past Surgical History:  Procedure Laterality Date  . RIGHT/LEFT HEART CATH AND CORONARY ANGIOGRAPHY N/A 05/10/2019   Procedure: RIGHT/LEFT HEART CATH AND CORONARY ANGIOGRAPHY;  Surgeon: Yvonne Kendall, MD;  Location: [facility] INVASIVE CV LAB;  Service: Cardiovascular;  Laterality: N/A;       Family History  Problem Relation Age of Onset  . Hypertension Maternal Grandmother     Social History   Tobacco Use  . Smoking status: Never Smoker  . Smokeless tobacco: Never Used  Vaping Use  . Vaping Use: Never used  Substance Use Topics  . Alcohol use: Yes    Comment: 2 quarts a week   . Drug use: Yes    Frequency: 3.0 times per week    Types: Marijuana, Cocaine    Home Medications Prior to Admission medications   Medication Sig Start Date End Date Taking? Authorizing Provider  aspirin 81 MG EC tablet Take 1 tablet (81 mg total) by mouth daily. Patient not taking: Reported on 08/28/2020 06/24/19   Charlestine Night, PA-C  atorvastatin (LIPITOR) 80 MG tablet Take 1 tablet (80 mg total) by mouth daily. Please  keep upcoming appt with Dr. Elease Hashimoto for November before anymore refills. Thank you 09/13/20   Nahser, Deloris Ping, MD  carvedilol (COREG) 12.5 MG tablet Take 1 tablet (12.5 mg total) by mouth 2 (two) times daily with a meal. Please keep upcoming appt in November with Dr. Elease Hashimoto before anymore refills. Thank you 09/13/20   Nahser, Deloris Ping, MD  diclofenac sodium (VOLTAREN) 1 % GEL Apply 2 g topically 4 (four) times daily as needed (pain). Patient not taking: Reported on 08/28/2020    [provider]  spironolactone (ALDACTONE) 25 MG tablet Take 1 tablet (25 mg total) by mouth daily. 09/11/20   Glade Lloyd, MD  torsemide (DEMADEX) 20 MG tablet Take 1 tablet (20 mg total) by mouth daily. Please make overdue appt with Dr. Elease Hashimoto before anymore refills. 1st attempt 09/11/20   Glade Lloyd, MD    Allergies    Hydrocodone  Review of Systems   Review of Systems  Constitutional: Positive for fatigue. Negative for chills and fever.  HENT: Negative.   Respiratory: Positive for shortness of breath. Negative for cough.   Cardiovascular: Positive for leg swelling. Negative for chest pain and palpitations.  Gastrointestinal: Negative for abdominal pain, nausea and vomiting.  Musculoskeletal: Negative for arthralgias and joint swelling.  Skin: Negative for color change and rash.  Neurological: Negative for dizziness, syncope and light-headedness.  All other systems reviewed and are negative.   Physical Exam Updated Vital Signs BP (!) 147/114   Pulse 90   Temp 98 F (36.7 C) (Oral)   Resp 14   Ht 5\' 11"  (1.803 m)   Wt 119.7 kg   SpO2 91%   BMI 36.81 kg/m   Physical Exam Vitals and nursing note reviewed.  Constitutional:      General: He is not in acute distress.    Appearance: He is well-developed and well-nourished. He is ill-appearing. He is not diaphoretic.     Comments: Alert, ill-appearing but in no acute distress  HENT:     Head: Normocephalic and atraumatic.      Mouth/Throat:     Mouth: Oropharynx is clear and moist.  Eyes:     General:        Right eye: No discharge.        Left eye: No discharge.     Extraocular Movements: EOM normal.     Pupils: Pupils are equal, round, and reactive to light.  Cardiovascular:     Rate and Rhythm: Normal rate and regular rhythm.     Pulses: Intact distal pulses.     Heart sounds: Normal heart sounds. No murmur heard. No friction rub. No gallop.   Pulmonary:     Effort: Pulmonary effort is normal. No respiratory distress.     Breath sounds: Examination of the right-lower field reveals rales. Examination of the left-lower field reveals rales. Rales present. No wheezing.     Comments: At rest  patient's respirations are equal and unlabored, with activity he becomes tachypneic with increased work of breathing.  He is able to speak in full sentences, satting well on room air.  On auscultation he has rales in bilateral bases, no wheezes or rhonchi. Chest:     Chest wall: No tenderness.  Abdominal:     General: Bowel sounds are normal. There is no distension.     Palpations: Abdomen is soft. There is no mass.     Tenderness: There is no abdominal tenderness. There is no guarding.     Comments: Abdomen soft, nondistended, nontender to palpation in all quadrants without guarding or peritoneal signs   Musculoskeletal:        General: No deformity.     Cervical back: Neck supple.     Right lower leg: No tenderness. Edema present.     Left lower leg: No tenderness. Edema present.     Comments: Mild lower extremity edema noted bilaterally, there is some increased fullness over the medial aspect of the left knee and distal thigh with no overlying skin changes.  No focal bony tenderness over the knee. 2+ DP pulses bilaterally.  Skin:    General: Skin is warm and dry.     Capillary Refill: Capillary refill takes less than 2 seconds.  Neurological:     Mental Status: He is alert.     Coordination: Coordination normal.      Comments: Speech is clear, able to follow commands Moves extremities without ataxia, coordination intact  Psychiatric:        Mood and Affect: Mood normal.        Behavior: Behavior normal.     ED Results / Procedures / Treatments   Labs (all labs ordered are listed, but only abnormal results are displayed) Labs Reviewed  BASIC METABOLIC PANEL - Abnormal; Notable for the following components:      Result Value   Glucose, Bld 112 (*)    BUN 29 (*)    Creatinine, Ser 1.66 (*)    Calcium 8.4 (*)    GFR, Estimated 48 (*)    All other components within normal limits  BRAIN NATRIURETIC PEPTIDE - Abnormal; Notable for the following components:   B Natriuretic Peptide 3,415.1 (*)    All other components within normal limits  CBC WITH DIFFERENTIAL/PLATELET - Abnormal; Notable for the following components:   Hemoglobin 12.5 (*)    HCT 38.9 (*)    All other components within normal limits  TROPONIN I (HIGH SENSITIVITY) - Abnormal; Notable for the following components:   Troponin I (High Sensitivity) 30 (*)    All other components within normal limits  TROPONIN I (HIGH SENSITIVITY)    EKG EKG Interpretation  Date/Time:  Sunday October 15 2020 05:13:51 EST Ventricular Rate:  96 PR Interval:    QRS Duration: 110 QT Interval:  407 QTC Calculation: 515 R Axis:   73 Text Interpretation: Sinus rhythm Prolonged PR interval Probable left atrial enlargement Abnormal T, consider ischemia, diffuse leads Prolonged QT interval No significant change was found Confirmed by Paula Libra ([id number]) on 10/15/2020 5:16:09 AM   Radiology DG Chest 2 View  Result Date: 10/15/2020 CLINICAL DATA:  SOB EXAM: CHEST - 2 VIEW COMPARISON:  09/08/2020 and prior FINDINGS: Cardiomegaly and central pulmonary vascular congestion. No pneumothorax or pleural effusion. Hazy bibasilar opacities. No acute osseous abnormality. IMPRESSION: Hazy bibasilar opacities. Differential includes atelectasis, edema or infection.  Cardiomegaly and central pulmonary vascular congestion. Electronically Signed   By:  Stana Bunting M.D.   On: 10/15/2020 07:38   VAS [place] LOWER EXTREMITY VENOUS (DVT) ([facility] 7a-7p)  Result Date: 10/15/2020  Lower Venous DVT Study Indications: Swelling, and Edema.  Comparison Study: 08/24/20 previous Performing Technologist: Blanch Media RVS  Examination Guidelines: A complete evaluation includes B-mode imaging, spectral Doppler, color Doppler, and power Doppler as needed of all accessible portions of each vessel. Bilateral testing is considered an integral part of a complete examination. Limited examinations for reoccurring indications may be performed as noted. The reflux portion of the exam is performed with the patient in reverse Trendelenburg.  +-----+---------------+---------+-----------+----------+--------------+ RIGHTCompressibilityPhasicitySpontaneityPropertiesThrombus Aging +-----+---------------+---------+-----------+----------+--------------+ CFV  Full           Yes      Yes                                 +-----+---------------+---------+-----------+----------+--------------+   +---------+---------------+---------+-----------+----------+--------------+ LEFT     CompressibilityPhasicitySpontaneityPropertiesThrombus Aging +---------+---------------+---------+-----------+----------+--------------+ CFV      Full           Yes      Yes                                 +---------+---------------+---------+-----------+----------+--------------+ SFJ      Full                                                        +---------+---------------+---------+-----------+----------+--------------+ FV Prox  Full                                                        +---------+---------------+---------+-----------+----------+--------------+ FV Mid   Full                                                         +---------+---------------+---------+-----------+----------+--------------+ FV DistalFull                                                        +---------+---------------+---------+-----------+----------+--------------+ PFV      Full                                                        +---------+---------------+---------+-----------+----------+--------------+ POP      Full           Yes      Yes                                 +---------+---------------+---------+-----------+----------+--------------+ PTV      Full                                                        +---------+---------------+---------+-----------+----------+--------------+  PERO     Full                                                        +---------+---------------+---------+-----------+----------+--------------+     Summary: RIGHT: - No evidence of common femoral vein obstruction.  LEFT: - There is no evidence of deep vein thrombosis in the lower extremity.  - No cystic structure found in the popliteal fossa.  *See table(s) above for measurements and observations.    Preliminary     Procedures Procedures (including critical care time)  Medications Ordered in ED Medications  furosemide (LASIX) injection 40 mg (40 mg Intravenous Given 10/15/20 1610)    ED Course  I have reviewed the triage vital signs and the nursing notes.  Pertinent labs & imaging results that were available during my care of the patient were reviewed by me and considered in my medical decision making (see chart for details).    MDM Rules/Calculators/A&P                         59 year old male with history of CHF and COPD presents with shortness of breath over the past few days worsening overnight, unable to lay down to sleep due to worsening shortness of breath, no associated chest pain.  Has had some worsening lower extremity edema and also reports pain in particular in the left leg along the medial thigh.  On  arrival he has normal work of breathing at rest, with activity he does become more tachypneic, he is satting well on room air, does have rales bilaterally.  Some lower extremity edema noted in particular over the medial portion of the left knee and thigh.  Will get basic labs, troponin, BNP, chest x-ray, EKG and left DVT study.  Patient also reports continued cocaine use.  Suspect CHF exacerbation, he does not have wheezing or decreased air movement on exam, no productive cough, lower suspicion for COPD contributing to symptoms today.  I have independently ordered, reviewed and interpreted all labs and imaging: CBC: No leukocytosis, stable hemoglobin BMP: Glucose of 112, no other significant electrolyte derangements.  Creatinine a bit increased from prior at 1.66 with BUN of 29. Troponin, initial only mildly elevated at 30 BNP: Significantly elevated at 3415.1, 1 month ago was 2683  Chest x-ray with some hazy bibasilar opacities which could be atelectasis, edema or infection, cardiomegaly and central pulmonary vascular congestion noted, given clinical presentation favor this being pulmonary edema.  Covid and flu are both negative.  Patient does have a slight bump in his creatinine but still feel that he needs diuresis, he is on torsemide at home, ordered 40 mg of IV Lasix.  Given patient's EF of 20 to 25% with heart failure will consult cardiology.  DVT study is negative.  Discussed with Dr. Carolanne Grumbling with cardiology who agrees with plan for admission, feels patient is appropriate for hospitalist service.  Case discussed with Dr. Margie Ege with [facility] hospitalist who will see and admit the patient.   Final Clinical Impression(s) / ED Diagnoses Final diagnoses:  Acute on chronic congestive heart failure, unspecified heart failure type [facility])    Rx / DC Orders ED Discharge Orders    None       Dartha Lodge, [place] 10/15/20 1425  Glynn Octave, MD 10/15/20 [phone]

## 2020-10-15 NOTE — H&P (Signed)
History and Physical    Dale [name] APO:[id number] DOB: April 14, 1961 DOA: 10/15/2020  PCP: Grayce Sessions, NP  Patient coming from: Home  Chief Complaint: "My legs have been swelling for 3 days."  HPI: Dale [name] is a 59 y.o. male with medical history significant of cocaine abuse, combined HF, CAD. Presenting with 3 days of edema in BLE. He says he has not changed his diet or done anything different in the last three days. He tried to take his torsemide to help, but it doesn't change anything. He tried elevating his legs, but this only gives temporary relief. He reports cough w/ some mucus production over the same time. Tried delsym and robitussin, but only partial relief. Over the last day or so he reports groin pain w/ walking. He has tried no intervention for that. He decided since new problems where appearing, it was time to come to the ED.    ED Course: BNP was elevated. Trp elevated but flat. He was given lasix 40mg  IV. EDP spoke with cards (Dr. Mayford Knife). They recommended hospitalist admit. They would follow.   Review of Systems:  Denies CP, palpitations, HA, dizziness, syncopal episodes, seizure-like activity. Review of systems is otherwise negative for all not mentioned in HPI.   PMHx Past Medical History:  Diagnosis Date  . Alcohol abuse   . Chronic combined systolic and diastolic CHF (congestive heart failure) (HCC)    a. 09/27/18 showed mild LVH, EF 20-25%, grade 2 DD, mild MR, severely dilated LA, mildly dilated RV with mildly reduced RV function, mod RAE.  Marland Kitchen Cocaine abuse (HCC)   . Hypertension     PSHx Past Surgical History:  Procedure Laterality Date  . RIGHT/LEFT HEART CATH AND CORONARY ANGIOGRAPHY N/A 05/10/2019   Procedure: RIGHT/LEFT HEART CATH AND CORONARY ANGIOGRAPHY;  Surgeon: Yvonne Kendall, MD;  Location: [facility];  Service: Cardiovascular;  Laterality: N/A;    SocHx  reports that he has never smoked. He has never used smokeless tobacco. He  reports current alcohol use. He reports current drug use. Frequency: 3.00 times per week. Drugs: Marijuana and Cocaine.  Allergies  Allergen Reactions  . Hydrocodone Itching    FamHx Family History  Problem Relation Age of Onset  . Hypertension Maternal Grandmother     Prior to Admission medications   Medication Sig Start Date End Date Taking? Authorizing Provider  aspirin 81 MG EC tablet Take 1 tablet (81 mg total) by mouth daily. 06/24/19  Yes Lawyer, Cristal Deer, PA-C  atorvastatin (LIPITOR) 80 MG tablet Take 1 tablet (80 mg total) by mouth daily. Please keep upcoming appt with Dr. Elease Hashimoto for November before anymore refills. Thank you 09/13/20  Yes Nahser, Deloris Ping, MD  carvedilol (COREG) 12.5 MG tablet Take 1 tablet (12.5 mg total) by mouth 2 (two) times daily with a meal. Please keep upcoming appt in November with Dr. Elease Hashimoto before anymore refills. Thank you 09/13/20  Yes Nahser, Deloris Ping, MD  clopidogrel (PLAVIX) 75 MG tablet Take 75 mg by mouth daily.   Yes [provider]  diclofenac sodium (VOLTAREN) 1 % GEL Apply 2 g topically 4 (four) times daily as needed (pain).   Yes [provider]  isosorbide-hydrALAZINE (BIDIL) 20-37.5 MG tablet Take 1 tablet by mouth 3 (three) times daily.   Yes [provider]  pantoprazole (PROTONIX) 40 MG tablet Take 40 mg by mouth daily.   Yes [provider]  prazosin (MINIPRESS) 1 MG capsule Take 1 mg by mouth at bedtime.  Yes [provider]  spironolactone (ALDACTONE) 25 MG tablet Take 1 tablet (25 mg total) by mouth daily. 09/11/20  Yes Glade Lloyd, MD  torsemide (DEMADEX) 20 MG tablet Take 1 tablet (20 mg total) by mouth daily. Please make overdue appt with Dr. Elease Hashimoto before anymore refills. 1st attempt 09/11/20  Yes Glade Lloyd, MD    Physical Exam: Vitals:   10/15/20 0700 10/15/20 0743 10/15/20 0830 10/15/20 0930  BP: (!) 147/105 (!) 156/117 (!) 146/116 (!) 132/104  Pulse: 88 96 93 93   Resp: 18 (!) 22 (!) 22 19  Temp:      TempSrc:      SpO2: 98% 96% 97% 97%  Weight:      Height:        General: 59 y.o. male resting in bed in NAD Eyes: PERRL, normal sclera ENMT: Nares patent w/o discharge, orophaynx clear, dentition normal, ears w/o discharge/lesions/ulcers Neck: Supple, trachea midline Cardiovascular: RRR, +S1, S2, no m/g/r, equal pulses throughout Respiratory: CTABL, no w/r/r, normal WOB GI: BS+, NDNT, no masses noted, no organomegaly noted MSK: No c/c BLE pedal edema GU: No scrotal swelling, no cellulitis, no hernia felt Skin: No rashes, bruises, ulcerations noted Neuro: A&O x 3, no focal deficits Psyc: Appropriate interaction and affect, calm/cooperative  Labs on Admission: I have personally reviewed following labs and imaging studies  CBC: Recent Labs  Lab 10/15/20 0700  WBC 7.4  NEUTROABS 5.3  HGB 12.5*  HCT 38.9*  MCV 87.0  PLT 309   Basic Metabolic Panel: Recent Labs  Lab 10/15/20 0700  NA 139  K 3.5  CL 106  CO2 22  GLUCOSE 112*  BUN 29*  CREATININE 1.66*  CALCIUM 8.4*   GFR: Estimated Creatinine Clearance: 64.7 mL/min (A) (by C-G formula based on SCr of 1.66 mg/dL (H)). Liver Function Tests: No results for input(s): AST, ALT, ALKPHOS, BILITOT, PROT, ALBUMIN in the last 168 hours. No results for input(s): LIPASE, AMYLASE in the last 168 hours. No results for input(s): AMMONIA in the last 168 hours. Coagulation Profile: No results for input(s): INR, PROTIME in the last 168 hours. Cardiac Enzymes: No results for input(s): CKTOTAL, CKMB, CKMBINDEX, TROPONINI in the last 168 hours. BNP (last 3 results) No results for input(s): PROBNP in the last 8760 hours. HbA1C: No results for input(s): HGBA1C in the last 72 hours. CBG: No results for input(s): GLUCAP in the last 168 hours. Lipid Profile: No results for input(s): CHOL, HDL, LDLCALC, TRIG, CHOLHDL, LDLDIRECT in the last 72 hours. Thyroid Function Tests: No results for  input(s): TSH, T4TOTAL, FREET4, T3FREE, THYROIDAB in the last 72 hours. Anemia Panel: No results for input(s): VITAMINB12, FOLATE, FERRITIN, TIBC, IRON, RETICCTPCT in the last 72 hours. Urine analysis:    Component Value Date/Time   COLORURINE COLORLESS (A) 02/28/2019 1309   APPEARANCEUR CLEAR 02/28/2019 1309   LABSPEC 1.004 (L) 02/28/2019 1309   PHURINE 7.0 02/28/2019 1309   GLUCOSEU NEGATIVE 02/28/2019 1309   HGBUR NEGATIVE 02/28/2019 1309   BILIRUBINUR NEGATIVE 02/28/2019 1309   KETONESUR NEGATIVE 02/28/2019 1309   PROTEINUR NEGATIVE 02/28/2019 1309   UROBILINOGEN 0.2 03/11/2016 0934   NITRITE NEGATIVE 02/28/2019 1309   LEUKOCYTESUR NEGATIVE 02/28/2019 1309    Radiological Exams on Admission: DG Chest 2 View  Result Date: 10/15/2020 CLINICAL DATA:  SOB EXAM: CHEST - 2 VIEW COMPARISON:  09/08/2020 and prior FINDINGS: Cardiomegaly and central pulmonary vascular congestion. No pneumothorax or pleural effusion. Hazy bibasilar opacities. No acute osseous abnormality. IMPRESSION: Hazy bibasilar  opacities. Differential includes atelectasis, edema or infection. Cardiomegaly and central pulmonary vascular congestion. Electronically Signed   By: Stana Bunting M.D.   On: 10/15/2020 07:38   VAS [place] LOWER EXTREMITY VENOUS (DVT) ([facility] 7a-7p)  Result Date: 10/15/2020  Lower Venous DVT Study Indications: Swelling, and Edema.  Comparison Study: 08/24/20 previous Performing Technologist: Blanch Media RVS  Examination Guidelines: A complete evaluation includes B-mode imaging, spectral Doppler, color Doppler, and power Doppler as needed of all accessible portions of each vessel. Bilateral testing is considered an integral part of a complete examination. Limited examinations for reoccurring indications may be performed as noted. The reflux portion of the exam is performed with the patient in reverse Trendelenburg.  +-----+---------------+---------+-----------+----------+--------------+  RIGHTCompressibilityPhasicitySpontaneityPropertiesThrombus Aging +-----+---------------+---------+-----------+----------+--------------+ CFV  Full           Yes      Yes                                 +-----+---------------+---------+-----------+----------+--------------+   +---------+---------------+---------+-----------+----------+--------------+ LEFT     CompressibilityPhasicitySpontaneityPropertiesThrombus Aging +---------+---------------+---------+-----------+----------+--------------+ CFV      Full           Yes      Yes                                 +---------+---------------+---------+-----------+----------+--------------+ SFJ      Full                                                        +---------+---------------+---------+-----------+----------+--------------+ FV Prox  Full                                                        +---------+---------------+---------+-----------+----------+--------------+ FV Mid   Full                                                        +---------+---------------+---------+-----------+----------+--------------+ FV DistalFull                                                        +---------+---------------+---------+-----------+----------+--------------+ PFV      Full                                                        +---------+---------------+---------+-----------+----------+--------------+ POP      Full           Yes      Yes                                 +---------+---------------+---------+-----------+----------+--------------+  PTV      Full                                                        +---------+---------------+---------+-----------+----------+--------------+ PERO     Full                                                        +---------+---------------+---------+-----------+----------+--------------+     Summary: RIGHT: - No evidence of common femoral vein  obstruction.  LEFT: - There is no evidence of deep vein thrombosis in the lower extremity.  - No cystic structure found in the popliteal fossa.  *See table(s) above for measurements and observations.    Preliminary     EKG: Independently reviewed. Sinus, no st elevations  Assessment/Plan Acute on chronic combined HF     - admit to inpatient, telemetry     - continue lasix IV 40mg  daily; he is not that far off his baseline BNP (2500 - 2800); he is 3400 at admission      - EDP spoke with cards (Dr. ); to follow, appreciate assistance.      - trp flat 30 - 32; no chest pain, EKG w/ no ST elevations, follow  Cocaine abuse     - counseled against further uses; needs a rehab program ultimately     - last used cocaine last night; hold BB for now  Productive cough     - CXR w/ bibasilar opacities     - COVID negative     - no WBC, fever, doing fine on RA     - anti-tussives; hold on abx for right now  AKI     - baseline Scr is about 1.2; he's 1.66 at admission     - stuck in that he needs diuresis right now; will continue IV lasix at 40mg  qday     - check renal Mayford Knife, watch other nephrotoxins     - follow labs  Groin pain     - scrotum is symmetrical w/o swelling; no hernia on exam     - follow  Normocytic anemia     - no evidence of bleed, follow  CAD     - ASA, statin, plavix  GERD     - protonix, PRN tums  DVT prophylaxis: lovenox  Code Status: FULL  Family Communication: None at bedside.   Consults called: EDP spoke with cardiology (Dr. )  Status is: Inpatient  Remains inpatient appropriate because:Inpatient level of care appropriate due to severity of illness   Dispo: The patient is from: Home              Anticipated d/c is to: Home              Anticipated d/c date is: 2 days              Patient currently is not medically stable to d/c.  [place] DO [facility]  If 7PM-7AM, please contact night-coverage [email or web address]  10/15/2020, 10:34  AM

## 2020-10-15 NOTE — Progress Notes (Signed)
Lower extremity venous has been completed.   Preliminary results in CV Proc.   Blanch Media 10/15/2020 10:12 AM

## 2020-10-16 ENCOUNTER — Encounter (HOSPITAL_COMMUNITY): Payer: Self-pay | Admitting: Internal Medicine

## 2020-10-16 ENCOUNTER — Other Ambulatory Visit: Payer: Self-pay

## 2020-10-16 DIAGNOSIS — N189 Chronic kidney disease, unspecified: Secondary | ICD-10-CM

## 2020-10-16 DIAGNOSIS — E785 Hyperlipidemia, unspecified: Secondary | ICD-10-CM

## 2020-10-16 DIAGNOSIS — N179 Acute kidney failure, unspecified: Secondary | ICD-10-CM

## 2020-10-16 LAB — COMPREHENSIVE METABOLIC PANEL
ALT: 16 U/L (ref 0–44)
AST: 16 U/L (ref 15–41)
Albumin: 2.8 g/dL — ABNORMAL LOW (ref 3.5–5.0)
Alkaline Phosphatase: 49 U/L (ref 38–126)
Anion gap: 11 (ref 5–15)
BUN: 26 mg/dL — ABNORMAL HIGH (ref 6–20)
CO2: 24 mmol/L (ref 22–32)
Calcium: 8.2 mg/dL — ABNORMAL LOW (ref 8.9–10.3)
Chloride: 104 mmol/L (ref 98–111)
Creatinine, Ser: 1.61 mg/dL — ABNORMAL HIGH (ref 0.61–1.24)
GFR, Estimated: 50 mL/min — ABNORMAL LOW (ref >60)
Glucose, Bld: 119 mg/dL — ABNORMAL HIGH (ref 70–99)
Potassium: 3.5 mmol/L (ref 3.5–5.1)
Sodium: 139 mmol/L (ref 135–145)
Total Bilirubin: 0.7 mg/dL (ref 0.3–1.2)
Total Protein: 5.6 g/dL — ABNORMAL LOW (ref 6.5–8.1)

## 2020-10-16 LAB — CBC
HCT: 38.1 % — ABNORMAL LOW (ref 39.0–52.0)
Hemoglobin: 12.2 g/dL — ABNORMAL LOW (ref 13.0–17.0)
MCH: 28 pg (ref 26.0–34.0)
MCHC: 32 g/dL (ref 30.0–36.0)
MCV: 87.4 fL (ref 80.0–100.0)
Platelets: 275 10*3/uL (ref 150–400)
RBC: 4.36 MIL/uL (ref 4.22–5.81)
RDW: 15 % (ref 11.5–15.5)
WBC: 6.5 10*3/uL (ref 4.0–10.5)
nRBC: 0 % (ref 0.0–0.2)

## 2020-10-16 MED ORDER — CLOPIDOGREL BISULFATE 75 MG PO TABS
75.0000 mg | ORAL_TABLET | Freq: Every day | ORAL | Status: DC
  Administered 2020-10-16: 75 mg via ORAL
  Filled 2020-10-16: qty 1

## 2020-10-16 MED ORDER — PANTOPRAZOLE SODIUM 40 MG PO TBEC
40.0000 mg | DELAYED_RELEASE_TABLET | Freq: Every day | ORAL | Status: DC
  Administered 2020-10-16 – 2020-10-21 (×6): 40 mg via ORAL
  Filled 2020-10-16 (×6): qty 1

## 2020-10-16 MED ORDER — POTASSIUM CHLORIDE CRYS ER 20 MEQ PO TBCR
20.0000 meq | EXTENDED_RELEASE_TABLET | Freq: Every day | ORAL | Status: DC
  Administered 2020-10-16 – 2020-10-18 (×3): 20 meq via ORAL
  Filled 2020-10-16 (×3): qty 1

## 2020-10-16 MED ORDER — ASPIRIN EC 81 MG PO TBEC
81.0000 mg | DELAYED_RELEASE_TABLET | Freq: Every day | ORAL | Status: DC
  Administered 2020-10-16 – 2020-10-21 (×6): 81 mg via ORAL
  Filled 2020-10-16 (×5): qty 1

## 2020-10-16 MED ORDER — ATORVASTATIN CALCIUM 40 MG PO TABS
80.0000 mg | ORAL_TABLET | Freq: Every day | ORAL | Status: DC
  Administered 2020-10-16: 80 mg via ORAL
  Filled 2020-10-16: qty 2

## 2020-10-16 NOTE — Progress Notes (Signed)
PROGRESS NOTE    Dale [name]  GLO:[id number] DOB: January 03, 1961 DOA: 10/15/2020 PCP: Grayce Sessions, NP   Brief Narrative: Dale [name] is a 59 y.o. male with a history of cocaine abuse, heart failure, CAD, hypertension, hyperlipidemia. Patient presented secondary to leg swelling with evidence of acute heart failure. IV lasix initiated.    Assessment & Plan:   Principal Problem:   Acute on chronic combined systolic and diastolic HF (heart failure) (HCC) Active Problems:   Essential hypertension   Cocaine abuse (HCC)   Hyperlipidemia   AKI (acute kidney injury) (HCC)  Acute on chronic combined systolic and diastolic heart failure Last EF of 20% with grade III diastolic dysfunction. Elevated BNP on admission. Mildly elevated but flat troponin. Started on Lasix IV. Patient with a history of on-going cocaine abuse. He is on Coreg, spironolactone and torsemide -Continue Lasix IV -Cardiology consulted on admission; recommendations pending -Daily BMP  Left thigh pain Likely muscle sprain. Venous duplex negative for blood clot -PT eval -CK  Productive cough In setting of heart failure. Improved. No current concern for pneumonia.  AKI Baseline creatinine of 1.1. Creatinine of 1.66 on admission -BMP while diuresing  CAD -Continue aspirin, Plavix -Hold Lipitor in setting of thigh pain  Essential hypertension Patient is on prazosin, Coreg and Bidil -Hold Coreg in setting of cocaine use   DVT prophylaxis: Lovenox Code Status:   Code Status: Full Code Family Communication: None at bedside Disposition Plan: Discharge likely in 1-3 days pending continued diuresis, cardiology recommendations   Consultants:   Cardiology  Procedures:   None  Antimicrobials:  None    Subjective: Left thigh pain. Breathing better.  Objective: Vitals:   10/16/20 0150 10/16/20 0657 10/16/20 1044 10/16/20 1356  BP: (!) 150/104 (!) 132/101 117/88 134/73  Pulse: 96 100 90 98   Resp: 20 20 20 20   Temp: 98.3 F (36.8 C) 98.6 F (37 C) (!) 97.5 F (36.4 C) (!) 97.5 F (36.4 C)  TempSrc: Oral Oral Oral Oral  SpO2: 97% 97% 98% 99%  Weight:      Height:        Intake/Output Summary (Last 24 hours) at 10/16/2020 1622 Last data filed at 10/16/2020 1300 Gross per 24 hour  Intake 240 ml  Output 3900 ml  Net -3660 ml   Filed Weights   10/15/20 0514  Weight: 119.7 kg    Examination:  General exam: Appears calm and comfortable Respiratory system: Diminished but clear to auscultation. Respiratory effort normal. Cardiovascular system: S1 & S2 heard, RRR. No murmurs, rubs, gallops or clicks. Gastrointestinal system: Abdomen is nondistended, soft and nontender. No organomegaly or masses felt. Normal bowel sounds heard. Central nervous system: Alert and oriented. No focal neurological deficits. Musculoskeletal: BLE edema. No calf tenderness. Left thigh tenderness anteriorly. Limited hip/knee passive ROM secondary to patient not adherent with exam. Active ROM of left thigh limited secondary to pain but patient with active full ROM of left knee Skin: No cyanosis. No rashes Psychiatry: Judgement and insight appear normal. Mood & affect appropriate.     Data Reviewed: I have personally reviewed following labs and imaging studies  CBC Lab Results  Component Value Date   WBC 6.5 10/16/2020   RBC 4.36 10/16/2020   HGB 12.2 (L) 10/16/2020   HCT 38.1 (L) 10/16/2020   MCV 87.4 10/16/2020   MCH 28.0 10/16/2020   PLT 275 10/16/2020   MCHC 32.0 10/16/2020   RDW 15.0 10/16/2020   LYMPHSABS 1.3 10/15/2020  MONOABS 0.7 10/15/2020   EOSABS 0.1 10/15/2020   BASOSABS 0.0 10/15/2020     Last metabolic panel Lab Results  Component Value Date   NA 139 10/16/2020   K 3.5 10/16/2020   CL 104 10/16/2020   CO2 24 10/16/2020   BUN 26 (H) 10/16/2020   CREATININE 1.61 (H) 10/16/2020   GLUCOSE 119 (H) 10/16/2020   GFRNONAA 50 (L) 10/16/2020   GFRAA >60 06/24/2019    CALCIUM 8.2 (L) 10/16/2020   PHOS 4.6 09/28/2018   PROT 5.6 (L) 10/16/2020   ALBUMIN 2.8 (L) 10/16/2020   BILITOT 0.7 10/16/2020   ALKPHOS 49 10/16/2020   AST 16 10/16/2020   ALT 16 10/16/2020   ANIONGAP 11 10/16/2020    CBG (last 3)  No results for input(s): GLUCAP in the last 72 hours.   GFR: Estimated Creatinine Clearance: 66.7 mL/min (A) (by C-G formula based on SCr of 1.61 mg/dL (H)).  Coagulation Profile: No results for input(s): INR, PROTIME in the last 168 hours.  Recent Results (from the past 240 hour(s))  Resp Panel by RT-PCR (Flu A&B, Covid) Nasopharyngeal Swab     Status: None   Collection Time: 10/15/20 10:51 AM   Specimen: Nasopharyngeal Swab; Nasopharyngeal(NP) swabs in vial transport medium  Result Value Ref Range Status   SARS Coronavirus 2 by RT PCR NEGATIVE NEGATIVE Final    Comment: (NOTE) SARS-CoV-2 target nucleic acids are NOT DETECTED.  The SARS-CoV-2 RNA is generally detectable in upper respiratory specimens during the acute phase of infection. The lowest concentration of SARS-CoV-2 viral copies this assay can detect is 138 copies/mL. A negative result does not preclude SARS-Cov-2 infection and should not be used as the sole basis for treatment or other patient management decisions. A negative result may occur with  improper specimen collection/handling, submission of specimen other than nasopharyngeal swab, presence of viral mutation(s) within the areas targeted by this assay, and inadequate number of viral copies(<138 copies/mL). A negative result must be combined with clinical observations, patient history, and epidemiological information. The expected result is Negative.  Fact Sheet for Patients:  BloggerCourse.com  Fact Sheet for Healthcare Providers:  SeriousBroker.it  This test is no t yet approved or cleared by the [place] FDA and  has been authorized for detection and/or  diagnosis of SARS-CoV-2 by FDA under an Emergency Use Authorization (EUA). This EUA will remain  in effect (meaning this test can be used) for the duration of the COVID-19 declaration under Section 564(b)(1) of the Act, 21 U.S.C.section 360bbb-3(b)(1), unless the authorization is terminated  or revoked sooner.       Influenza A by PCR NEGATIVE NEGATIVE Final   Influenza B by PCR NEGATIVE NEGATIVE Final    Comment: (NOTE) The Xpert Xpress SARS-CoV-2/FLU/RSV plus assay is intended as an aid in the diagnosis of influenza from Nasopharyngeal swab specimens and should not be used as a sole basis for treatment. Nasal washings and aspirates are unacceptable for Xpert Xpress SARS-CoV-2/FLU/RSV testing.  Fact Sheet for Patients: BloggerCourse.com  Fact Sheet for Healthcare Providers: SeriousBroker.it  This test is not yet approved or cleared by the [place] FDA and has been authorized for detection and/or diagnosis of SARS-CoV-2 by FDA under an Emergency Use Authorization (EUA). This EUA will remain in effect (meaning this test can be used) for the duration of the COVID-19 declaration under Section 564(b)(1) of the Act, 21 U.S.C. section 360bbb-3(b)(1), unless the authorization is terminated or revoked.  Performed at [facility], [address] [place], [place] [address]         Radiology Studies: DG Chest 2 View  Result Date: 10/15/2020 CLINICAL DATA:  SOB EXAM: CHEST - 2 VIEW COMPARISON:  09/08/2020 and prior FINDINGS: Cardiomegaly and central pulmonary vascular congestion. No pneumothorax or pleural effusion. Hazy bibasilar opacities. No acute osseous abnormality. IMPRESSION: Hazy bibasilar opacities. Differential includes atelectasis, edema or infection. Cardiomegaly and central pulmonary vascular congestion. Electronically Signed   By: Stana Bunting M.D.   On: 10/15/2020 07:38   US  RENAL  Result Date: 10/15/2020 CLINICAL DATA:  Acute kidney injury EXAM: RENAL / URINARY TRACT ULTRASOUND COMPLETE COMPARISON:  CT renal colic 05/07/2019 FINDINGS: Right Kidney: Renal measurements: 10.6 x 7 x 6.8 = volume: 260 mL. Echogenicity is within normal limits. No concerning renal mass, shadowing calculus or hydronephrosis. Left Kidney: Renal measurements: 10.4 x 5.4 x 5.4 cm = volume: 160 mL. Echogenicity is within normal limits. No concerning renal mass, shadowing calculus or hydronephrosis. Bladder: Unremarkable for the degree of distention. Other: None. IMPRESSION: Unremarkable renal ultrasound. Electronically Signed   By: Kreg Shropshire M.D.   On: 10/15/2020 15:24   VAS [place] LOWER EXTREMITY VENOUS (DVT) ([facility] and [facility] 7a-7p)  Result Date: 10/15/2020  Lower Venous DVT Study Indications: Swelling, and Edema.  Comparison Study: 08/24/20 previous Performing Technologist: Blanch Media RVS  Examination Guidelines: A complete evaluation includes B-mode imaging, spectral Doppler, color Doppler, and power Doppler as needed of all accessible portions of each vessel. Bilateral testing is considered an integral part of a complete examination. Limited examinations for reoccurring indications may be performed as noted. The reflux portion of the exam is performed with the patient in reverse Trendelenburg.  +-----+---------------+---------+-----------+----------+--------------+ RIGHTCompressibilityPhasicitySpontaneityPropertiesThrombus Aging +-----+---------------+---------+-----------+----------+--------------+ CFV  Full           Yes      Yes                                 +-----+---------------+---------+-----------+----------+--------------+   +---------+---------------+---------+-----------+----------+--------------+ LEFT     CompressibilityPhasicitySpontaneityPropertiesThrombus Aging +---------+---------------+---------+-----------+----------+--------------+ CFV      Full           Yes       Yes                                 +---------+---------------+---------+-----------+----------+--------------+ SFJ      Full                                                        +---------+---------------+---------+-----------+----------+--------------+ FV Prox  Full                                                        +---------+---------------+---------+-----------+----------+--------------+ FV Mid   Full                                                        +---------+---------------+---------+-----------+----------+--------------+  FV DistalFull                                                        +---------+---------------+---------+-----------+----------+--------------+ PFV      Full                                                        +---------+---------------+---------+-----------+----------+--------------+ POP      Full           Yes      Yes                                 +---------+---------------+---------+-----------+----------+--------------+ PTV      Full                                                        +---------+---------------+---------+-----------+----------+--------------+ PERO     Full                                                        +---------+---------------+---------+-----------+----------+--------------+     Summary: RIGHT: - No evidence of common femoral vein obstruction.  LEFT: - There is no evidence of deep vein thrombosis in the lower extremity.  - No cystic structure found in the popliteal fossa.  *See table(s) above for measurements and observations.    Preliminary         Scheduled Meds: . aspirin EC  81 mg Oral Daily  . atorvastatin  80 mg Oral Daily  . clopidogrel  75 mg Oral Daily  . enoxaparin (LOVENOX) injection  60 mg Subcutaneous Q24H  . furosemide  40 mg Intravenous Daily  . isosorbide-hydrALAZINE  1 tablet Oral TID  . pantoprazole  40 mg Oral Daily  . potassium chloride  20 mEq  Oral Daily   Continuous Infusions:   LOS: 1 day     Jacquelin Hawking, MD [facility] 10/16/2020, 4:22 PM  If 7PM-7AM, please contact night-coverage [email or web address]

## 2020-10-16 NOTE — Progress Notes (Signed)
Notified by telemetry that patient had 7 beats of VTACH. Paged MD Tish Frederickson. Patient not in any distress. No changes from previous assessment.

## 2020-10-16 NOTE — Progress Notes (Signed)
This nurse was notified by telemetry monitoring tech that patient had a 7 beat run of [facility], patient was in no distress and no changes noted from previous assessment. MD was notified and is aware. No new orders in place.

## 2020-10-16 NOTE — Evaluation (Signed)
Physical Therapy Evaluation Patient Details Name: Dale [name] MRN: [id number] DOB: Mar 20, 1961 Today's Date: 10/16/2020   History of Present Illness  59 yo male admitted with LE edema, HF. Hx of drug abuse, HF, CAD, ETOH abuse  Clinical Impression  On eval, pt required Min assist for mobility. He walked ~20 feet around the room. Gait is antalgic and unsteady. Pt rated pain 7/10 with activity. Discussed d/c plan-pt is agreeable to ST rehab at [facility].     Follow Up Recommendations SNF    Equipment Recommendations  None recommended by PT    Recommendations for Other Services       Precautions / Restrictions Precautions Precautions: Fall Restrictions Weight Bearing Restrictions: No      Mobility  Bed Mobility Overal bed mobility: Modified Independent                  Transfers Overall transfer level: Needs assistance   Transfers: Sit to/from Stand Sit to Stand: Supervision            Ambulation/Gait Ambulation/Gait assistance: Min assist Gait Distance (Feet): 20 Feet Assistive device: None Gait Pattern/deviations: Step-to pattern;Antalgic;Decreased stride length;Decreased weight shift to left     General Gait Details: Intermittent assist to steady. Pt walked around the room with very antalgic gait pattern. Unsteady.  Stairs            Wheelchair Mobility    Modified Rankin (Stroke Patients Only)       Balance Overall balance assessment: Needs assistance           Standing balance-Leahy Scale: Fair                               Pertinent Vitals/Pain Pain Assessment: 0-10 Pain Score: 7  Pain Location: L groin,thigh Pain Descriptors / Indicators: Grimacing;Sharp Pain Intervention(s): Monitored during session;Limited activity within patient's tolerance    Home Living Family/patient expects to be discharged to:: Shelter/Homeless                      Prior Function Level of Independence: Independent                Hand Dominance        Extremity/Trunk Assessment   Upper Extremity Assessment Upper Extremity Assessment: Overall WFL for tasks assessed    Lower Extremity Assessment Lower Extremity Assessment: LLE deficits/detail LLE Deficits / Details: SLR 3-/5-limited by pain    Cervical / Trunk Assessment Cervical / Trunk Assessment: Normal  Communication   Communication: No difficulties  Cognition Arousal/Alertness: Awake/alert Behavior During Therapy: WFL for tasks assessed/performed Overall Cognitive Status: Within Functional Limits for tasks assessed                                        General Comments      Exercises General Exercises - Lower Extremity Heel Slides: AROM;Left;10 reps   Assessment/Plan    PT Assessment Patient needs continued PT services  PT Problem List Decreased strength;Decreased mobility;Decreased activity tolerance;Decreased balance       PT Treatment Interventions DME instruction;Therapeutic activities;Gait training;Therapeutic exercise;Functional mobility training;Balance training;Patient/family education    PT Goals (Current goals can be found in the Care Plan section)  Acute Rehab PT Goals Patient Stated Goal: less pain PT Goal Formulation: With patient Time For Goal Achievement: 10/30/20 Potential to Achieve  Goals: Good    Frequency Min 2X/week   Barriers to discharge        Co-evaluation               AM-PAC PT "6 Clicks" Mobility  Outcome Measure Help needed turning from your back to your side while in a flat bed without using bedrails?: A Little Help needed moving from lying on your back to sitting on the side of a flat bed without using bedrails?: A Little Help needed moving to and from a bed to a chair (including a wheelchair)?: A Little Help needed standing up from a chair using your arms (e.g., wheelchair or bedside chair)?: A Little Help needed to walk in hospital room?: A Little Help needed  climbing 3-5 steps with a railing? : A Lot 6 Click Score: 17    End of Session   Activity Tolerance: Patient limited by pain Patient left: in bed;with call bell/phone within reach   PT Visit Diagnosis: Pain;Unsteadiness on feet (R26.81);Difficulty in walking, not elsewhere classified (R26.2) Pain - Right/Left: Left Pain - part of body: Hip    Time: 0881-1031 PT Time Calculation (min) (ACUTE ONLY): 12 min   Charges:   PT Evaluation $PT Eval Moderate Complexity: 1 Mod         Faye Ramsay, PT Acute Rehabilitation  Office: [phone] Pager: [phone]     Rebeca Alert [facility] 10/16/2020, 5:03 PM

## 2020-10-17 ENCOUNTER — Inpatient Hospital Stay (HOSPITAL_COMMUNITY): Payer: Medicaid Other

## 2020-10-17 DIAGNOSIS — I5043 Acute on chronic combined systolic (congestive) and diastolic (congestive) heart failure: Secondary | ICD-10-CM

## 2020-10-17 DIAGNOSIS — I472 Ventricular tachycardia: Secondary | ICD-10-CM

## 2020-10-17 DIAGNOSIS — N179 Acute kidney failure, unspecified: Secondary | ICD-10-CM

## 2020-10-17 DIAGNOSIS — F1410 Cocaine abuse, uncomplicated: Secondary | ICD-10-CM

## 2020-10-17 LAB — BASIC METABOLIC PANEL
Anion gap: 10 (ref 5–15)
BUN: 25 mg/dL — ABNORMAL HIGH (ref 6–20)
CO2: 21 mmol/L — ABNORMAL LOW (ref 22–32)
Calcium: 8.2 mg/dL — ABNORMAL LOW (ref 8.9–10.3)
Chloride: 106 mmol/L (ref 98–111)
Creatinine, Ser: 1.36 mg/dL — ABNORMAL HIGH (ref 0.61–1.24)
GFR, Estimated: >60 mL/min (ref >60)
Glucose, Bld: 98 mg/dL (ref 70–99)
Potassium: 3.7 mmol/L (ref 3.5–5.1)
Sodium: 137 mmol/L (ref 135–145)

## 2020-10-17 LAB — CK: Total CK: 82 U/L (ref 49–397)

## 2020-10-17 LAB — MAGNESIUM: Magnesium: 2.1 mg/dL (ref 1.7–2.4)

## 2020-10-17 MED ORDER — ATORVASTATIN CALCIUM 40 MG PO TABS
40.0000 mg | ORAL_TABLET | Freq: Every day | ORAL | Status: DC
  Administered 2020-10-17 – 2020-10-20 (×4): 40 mg via ORAL
  Filled 2020-10-17 (×4): qty 1

## 2020-10-17 NOTE — Progress Notes (Signed)
Patient does not want [name] Little to visit.  Val [place]

## 2020-10-17 NOTE — Plan of Care (Signed)
  Problem: Education: Goal: Knowledge of General Education information will improve Description: Including pain rating scale, medication(s)/side effects and non-pharmacologic comfort measures Outcome: Progressing   Problem: Clinical Measurements: Goal: Respiratory complications will improve Outcome: Progressing   Problem: Clinical Measurements: Goal: Cardiovascular complication will be avoided Outcome: Progressing   Problem: Activity: Goal: Capacity to carry out activities will improve Outcome: Progressing   Problem: Cardiac: Goal: Ability to achieve and maintain adequate cardiopulmonary perfusion will improve Outcome: Progressing

## 2020-10-17 NOTE — Consult Note (Addendum)
Cardiology Consultation:   Patient ID: Dale [name]; [id number]; 09-Mar-1961   Admit date: 10/15/2020 Date of Consult: 10/17/2020  Primary Care Provider: Grayce Sessions, NP Primary Cardiologist: Kristeen Miss, MD 03/18/2019 Primary Electrophysiologist:  None   Patient Profile:   Dale [name] is a 59 y.o. 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  History of Present Illness:   [name] was hospitalized w/ CHF 11/5-11/06/2020, d/c weight 263 lbs. D/c on ASA, Coreg 12.5 bid, bidil tid, spiro 25 mg qd, Lipitor 80 mg and torsemide 20 mg qd.  Pt admitted 12/12 with LE edema, CHF exacerbation, had used cocaine the night before, Cr 1.66, he had NSVT and cards asked to see.   [name] admits that his living situation is not good.  Where he is, will be extremely hard for him to stay off drugs.  He had his medications at discharge, and was taking them.  He has since run out of almost everything.  He requests new prescriptions.  He does not have scales, but will use them if he has them.  In the few days before admission, he is noticing increasing lower extremity edema, increasing dyspnea on exertion, orthopnea and possibly PND.  In the 2 days since admission, his weight has gone from 263 down to 259 lbs.  He feels better from a respiratory standpoint.  However, he is irritable, unable to sleep, and generally is uncomfortable.  He wants to get off drugs, and wants to pursue an inpatient rehab after Christmas.  He admits that he needs counseling and help.   Past Medical History:  Diagnosis Date  . Alcohol abuse   . Chronic combined systolic and diastolic CHF (congestive heart failure) (HCC)    a. 09/27/18 showed mild LVH, EF 20-25%, grade 2 DD, mild MR, severely dilated LA, mildly dilated RV with mildly reduced RV  function, mod RAE.  Marland Kitchen Cocaine abuse (HCC)   . Hypertension     Past Surgical History:  Procedure Laterality Date  . RIGHT/LEFT HEART CATH AND CORONARY ANGIOGRAPHY N/A 05/10/2019   Procedure: RIGHT/LEFT HEART CATH AND CORONARY ANGIOGRAPHY;  Surgeon: Yvonne Kendall, MD;  Location: [facility] INVASIVE CV [facility];  Service: Cardiovascular;  Laterality: N/A;     Prior to Admission medications   Medication Sig Start Date End Date Taking? Authorizing Provider  aspirin 81 MG EC tablet Take 1 tablet (81 mg total) by mouth daily. 06/24/19  Yes Lawyer, Cristal Deer, PA-C  atorvastatin (LIPITOR) 80 MG tablet Take 1 tablet (80 mg total) by mouth daily. Please keep upcoming appt with Dr. Elease Hashimoto for November before anymore refills. Thank you 09/13/20  Yes Nahser, Deloris Ping, MD  carvedilol (COREG) 12.5 MG tablet Take 1 tablet (12.5 mg total) by mouth 2 (two) times daily with a meal. Please keep upcoming appt in November with Dr. Elease Hashimoto before anymore refills. Thank you 09/13/20  Yes Nahser, Deloris Ping, MD  clopidogrel (PLAVIX) 75 MG tablet Take 75 mg by mouth daily.   Yes [provider]  diclofenac sodium (VOLTAREN) 1 % GEL Apply 2 g topically 4 (four) times daily as needed (pain).   Yes [provider]  isosorbide-hydrALAZINE (BIDIL) 20-37.5 MG tablet Take 1 tablet by mouth 3 (three) times daily.   Yes [provider]  pantoprazole (PROTONIX) 40 MG tablet Take 40  mg by mouth daily.   Yes [provider]  prazosin (MINIPRESS) 1 MG capsule Take 1 mg by mouth at bedtime.   Yes [provider]  spironolactone (ALDACTONE) 25 MG tablet Take 1 tablet (25 mg total) by mouth daily. 09/11/20  Yes Glade Lloyd, MD  torsemide (DEMADEX) 20 MG tablet Take 1 tablet (20 mg total) by mouth daily. Please make overdue appt with Dr. Elease Hashimoto before anymore refills. 1st attempt 09/11/20  Yes Glade Lloyd, MD    Inpatient Medications: Scheduled Meds: . aspirin EC  81 mg Oral Daily  . enoxaparin  (LOVENOX) injection  60 mg Subcutaneous Q24H  . furosemide  40 mg Intravenous Daily  . isosorbide-hydrALAZINE  1 tablet Oral TID  . pantoprazole  40 mg Oral Daily  . potassium chloride  20 mEq Oral Daily   Continuous Infusions:  PRN Meds: ondansetron **OR** ondansetron (ZOFRAN) IV  Allergies:    Allergies  Allergen Reactions  . Hydrocodone Itching    Social History:   Social History   Socioeconomic History  . Marital status: Single    Spouse name: Not on file  . Number of children: Not on file  . Years of education: Not on file  . Highest education level: Not on file  Occupational History  . Not on file  Tobacco Use  . Smoking status: Never Smoker  . Smokeless tobacco: Never Used  Vaping Use  . Vaping Use: Never used  Substance and Sexual Activity  . Alcohol use: Yes    Comment: 2 quarts a week   . Drug use: Yes    Frequency: 3.0 times per week    Types: Marijuana, Cocaine  . Sexual activity: Not Currently  Other Topics Concern  . Not on file  Social History Narrative  . Not on file   Social Determinants of Health   Financial Resource Strain: Not on file  Food Insecurity: Not on file  Transportation Needs: Not on file  Physical Activity: Not on file  Stress: Not on file  Social Connections: Not on file  Intimate Partner Violence: Not on file    Family History:   Family History  Problem Relation Age of Onset  . Hypertension Maternal Grandmother    Family Status:  Family Status  Relation Name Status  . Mother  Alive  . Father  Alive  . MGM  (Not Specified)    ROS:  Please see the history of present illness.  All other ROS reviewed and negative.     Physical Exam/Data:   Vitals:   10/16/20 1356 10/16/20 2052 10/17/20 0424 10/17/20 0432  BP: 134/73 (!) 134/97 (!) 145/102   Pulse: 98 99 98   Resp: 20 20 20    Temp: (!) 97.5 F (36.4 C) 98 F (36.7 C) 98 F (36.7 C)   TempSrc: Oral  Oral   SpO2: 99% 100% 100%   Weight:    117.8 kg   Height:        Intake/Output Summary (Last 24 hours) at 10/17/2020 0819 Last data filed at 10/17/2020 0500 Gross per 24 hour  Intake 840 ml  Output 3200 ml  Net -2360 ml    Last 3 Weights 10/17/2020 10/15/2020 09/10/2020  Weight (lbs) 259 lb 11.2 oz 263 lb 14.3 oz 263 lb 14.4 oz  Weight (kg) 117.8 kg 119.7 kg 119.704 kg     Body mass index is 36.22 kg/m.   General:  Well nourished, well developed, male in no acute distress HEENT: normal  Lymph: no adenopathy Neck: JVD -elevated Endocrine:  No thryomegaly Vascular: No carotid bruits; 4/4 extremity pulses 2+  Cardiac:  normal S1, S2; RRR; no murmur Lungs: Rales bases, R>L bilaterally, no wheezing, rhonchi    Abd: soft, nontender, no hepatomegaly  Ext: 1+ L, trace R LE edema Musculoskeletal:  No deformities, BUE and BLE strength normal and equal Skin: warm and dry  Neuro:  CNs 2-12 intact, no focal abnormalities noted Psych:  Normal affect   EKG:  The EKG was personally reviewed and demonstrates: 12/12 ECG is sinus rhythm, borderline first-degree AV block, left atrial enlargement can change from 09/08/2020 Telemetry:  Telemetry was personally reviewed and demonstrates: Sinus rhythm, brief episode of possible second-degree type I heart block overnight so several runs of VT up to 7 beats, baseline heart rate 90s   CV studies:   ECHO: 09/09/2020 1. Left ventricular ejection fraction, by estimation, is approximately  20%. The left ventricle has severely decreased function. The left  ventricle demonstrates global hypokinesis with regional variation as  noted. The left ventricular internal cavity  size was moderately dilated. Left ventricular diastolic parameters are  consistent with Grade III diastolic dysfunction (restrictive). No obvious  mural thrombus noted with Definity contrast.  2. Right ventricular systolic function is moderately to severely reduced.  The right ventricular size is normal. There is moderately elevated   pulmonary artery systolic pressure. The estimated right ventricular  systolic pressure is 52.0 mmHg.  3. Left atrial size was mild to moderately dilated.  4. Right atrial size was mild to moderately dilated.  5. The mitral valve is grossly normal. Mild mitral valve regurgitation.  6. The aortic valve is tricuspid. Aortic valve regurgitation is trivial.  7. The inferior vena cava is dilated in size with <50% respiratory  variability, suggesting right atrial pressure of 15 mmHg.   CATH: 05/10/2019 Conclusions: 1. Severe single-vessel coronary artery disease with chronic total occlusion of the proximal RCA. 2. Mild to moderate, non-obstructive coronary artery disease involving the LAD and LCx. 3. Moderately elevated left heart filling pressures. 4. Severely elevated right heart filling pressures. 5. Mild pulmonary hypertension. 6. Normal Fick cardiac output/index.  Recommendations: 1. 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  Patient will be transported back to [facility] after recovery from today's procedure. 2. Continue diuresis and optimization of evidence-based heart failure therapy.  I will restart furosemide 40 mg IV BID this afternoon. 3. Secondary prevention of coronary artery disease, including high-intensity statin therapy and avoidance of cocaine.    Laboratory Data:   Chemistry Recent Labs  Lab 10/15/20 0700 10/16/20 0417 10/17/20 0358  NA 139 139 137  K 3.5 3.5 3.7  CL 106 104 106  CO2 22 24 21*  GLUCOSE 112* 119* 98  BUN 29* 26* 25*  CREATININE 1.66* 1.61* 1.36*  CALCIUM 8.4* 8.2* 8.2*  GFRNONAA 48* 50* >60  ANIONGAP 11 11 10     Lab Results  Component Value Date   ALT 16 10/16/2020   AST 16 10/16/2020   ALKPHOS 49 10/16/2020   BILITOT 0.7 10/16/2020   Hematology Recent Labs  Lab 10/15/20 0700 10/16/20 0417  WBC 7.4 6.5  RBC 4.47 4.36  HGB 12.5* 12.2*  HCT 38.9* 38.1*  MCV 87.0 87.4  MCH 28.0 28.0  MCHC  32.1 32.0  RDW 15.2 15.0  PLT 309 275   Cardiac Enzymes High Sensitivity Troponin:   Recent Labs  Lab 10/15/20 0700 10/15/20 0905  TROPONINIHS 30* 31*  Lab Results  Component Value Date   CKTOTAL 82 10/17/2020   TROPONINI 0.03 (HH) 04/11/2019   BNP B Natriuretic Peptide  Date Value Ref Range Status  10/15/2020 3,415.1 (H) 0.0 - 100.0 pg/mL Final    Comment:    Performed at [facility], [address], [place], [place] [address]  09/08/2020 2,683.5 (H) 0.0 - 100.0 pg/mL Final    Comment:    Performed at [facility], [address] [place], [place] [address]  08/28/2020 2,837.4 (H) 0.0 - 100.0 pg/mL Final    Comment:    Performed at [facility], [address], [place], [place] [address]  08/24/2020 2,551.8 (H) 0.0 - 100.0 pg/mL Final    Comment:    Performed at [facility], [address], [place], [place] [address]    DDimer No results for input(s): DDIMER in the last 168 hours. TSH:  Lab Results  Component Value Date   TSH 1.237 05/07/2019   Lipids: Lab Results  Component Value Date   CHOL 164 09/09/2020   HDL 45 09/09/2020   LDLCALC 111 (H) 09/09/2020   TRIG 38 09/09/2020   CHOLHDL 3.6 09/09/2020   HgbA1c: Lab Results  Component Value Date   HGBA1C 5.7 (H) 03/11/2016   Magnesium:  Magnesium  Date Value Ref Range Status  09/11/2020 2.1 1.7 - 2.4 mg/dL Final    Comment:    Performed at [facility], [address], [place], [place] [address]     Radiology/Studies:  DG Chest 2 View  Result Date: 10/15/2020 CLINICAL DATA:  SOB EXAM: CHEST - 2 VIEW COMPARISON:  09/08/2020 and prior FINDINGS: Cardiomegaly and central pulmonary vascular congestion. No pneumothorax or pleural effusion. Hazy bibasilar opacities. No acute osseous abnormality. IMPRESSION: Hazy bibasilar opacities. Differential includes atelectasis, edema or infection. Cardiomegaly and  central pulmonary vascular congestion. Electronically Signed   By: Stana Bunting M.D.   On: 10/15/2020 07:38   US RENAL  Result Date: 10/15/2020 CLINICAL DATA:  Acute kidney injury EXAM: RENAL / URINARY TRACT ULTRASOUND COMPLETE COMPARISON:  CT renal colic 05/07/2019 FINDINGS: Right Kidney: Renal measurements: 10.6 x 7 x 6.8 = volume: 260 mL. Echogenicity is within normal limits. No concerning renal mass, shadowing calculus or hydronephrosis. Left Kidney: Renal measurements: 10.4 x 5.4 x 5.4 cm = volume: 160 mL. Echogenicity is within normal limits. No concerning renal mass, shadowing calculus or hydronephrosis. Bladder: Unremarkable for the degree of distention. Other: None. IMPRESSION: Unremarkable renal ultrasound. Electronically Signed   By: Kreg Shropshire M.D.   On: 10/15/2020 15:24   VAS [place] LOWER EXTREMITY VENOUS (DVT) ([facility] and [facility] 7a-7p)  Result Date: 10/16/2020  Lower Venous DVT Study Indications: Swelling, and Edema.  Comparison Study: 08/24/20 previous Performing Technologist: Blanch Media RVS  Examination Guidelines: A complete evaluation includes B-mode imaging, spectral Doppler, color Doppler, and power Doppler as needed of all accessible portions of each vessel. Bilateral testing is considered an integral part of a complete examination. Limited examinations for reoccurring indications may be performed as noted. The reflux portion of the exam is performed with the patient in reverse Trendelenburg.  +-----+---------------+---------+-----------+----------+--------------+ RIGHTCompressibilityPhasicitySpontaneityPropertiesThrombus Aging +-----+---------------+---------+-----------+----------+--------------+ CFV  Full           Yes      Yes                                 +-----+---------------+---------+-----------+----------+--------------+   +---------+---------------+---------+-----------+----------+--------------+  LEFT      CompressibilityPhasicitySpontaneityPropertiesThrombus Aging +---------+---------------+---------+-----------+----------+--------------+ CFV      Full           Yes      Yes                                 +---------+---------------+---------+-----------+----------+--------------+ SFJ      Full                                                        +---------+---------------+---------+-----------+----------+--------------+ FV Prox  Full                                                        +---------+---------------+---------+-----------+----------+--------------+ FV Mid   Full                                                        +---------+---------------+---------+-----------+----------+--------------+ FV DistalFull                                                        +---------+---------------+---------+-----------+----------+--------------+ PFV      Full                                                        +---------+---------------+---------+-----------+----------+--------------+ POP      Full           Yes      Yes                                 +---------+---------------+---------+-----------+----------+--------------+ PTV      Full                                                        +---------+---------------+---------+-----------+----------+--------------+ PERO     Full                                                        +---------+---------------+---------+-----------+----------+--------------+     Summary: RIGHT: - No evidence of common femoral vein obstruction.  LEFT: - There is no evidence of deep vein thrombosis in the lower extremity.  - No cystic structure found in the popliteal fossa.  *See table(s) above for measurements and observations.  Electronically signed by Fabienne Bruns MD on 10/16/2020 at 7:42:57 PM.    Final     Assessment and Plan:   1.  Acute on chronic combined systolic and diastolic CHF -Still with  volume overload by exam, continue diuresis -Living situation is not good, diet is poor, currently unable to do daily weights, has run out of some meds. -We will ask social work to see to make sure that he can get medications as an outpatient and has transportation to appointments. -Says he has missed cardiology follow-up appointments because he was unable to get there -he has been continued/restarted on his ASA and BiDil, and is now on IV Lasix instead of torsemide 20 mg daily -Restart carvedilol 12.5 mg twice daily once volume status is a little more improved if this can be used in the setting of cocaine use -Creatinine was above baseline on admission and has improved since then.  Spironolactone is on hold  2.  Dyslipidemia -Goal LDL is less than 70, his was 111. -Atorvastatin was held because of thigh pain -However, LFTs are normal and CK was only 82. -MD advise on it okay to restart atorvastatin 80 mg    3.  CAD: -Chronically occluded RCA without other significant disease on heart catheterization Over a year ago -Continue primary prevention with aspirin, beta-blocker if possible, and statin  Otherwise, per IM Principal Problem:   Acute on chronic combined systolic and diastolic HF (heart failure) (HCC) Active Problems:   Essential hypertension   Cocaine abuse (HCC)   Hyperlipidemia   AKI (acute kidney injury) (HCC)     For questions or updates, please contact [facility] Please consult [email or web address] for contact info under Cardiology/STEMI.   SignedTheodore Demark, PA-C  10/17/2020 8:19 AM

## 2020-10-17 NOTE — Progress Notes (Signed)
PROGRESS NOTE    Dale [name]  JOI:[id number] DOB: 1961/02/24 DOA: 10/15/2020 PCP: Grayce Sessions, NP   Brief Narrative: Dale [name] is a 59 y.o. male with a history of cocaine abuse, heart failure, CAD, hypertension, hyperlipidemia. Patient presented secondary to leg swelling with evidence of acute heart failure. IV lasix initiated.    Assessment & Plan:   Principal Problem:   Acute on chronic combined systolic and diastolic HF (heart failure) (HCC) Active Problems:   Essential hypertension   Cocaine abuse (HCC)   Hyperlipidemia   AKI (acute kidney injury) (HCC)  Acute on chronic combined systolic and diastolic heart failure Last EF of 20% with grade III diastolic dysfunction. Elevated BNP on admission. Mildly elevated but flat troponin. Started on Lasix IV. Patient with a history of on-going cocaine abuse. He is on Coreg, spironolactone and torsemide -Continue Lasix IV -Cardiology consulted on admission; recommendations pending -Daily BMP  Left thigh pain Likely muscle sprain. Venous duplex negative for blood clot. CK normal. PT recommending  -X-ray for now; if negative, could consider advanced imaging or outpatient follow-up  Productive cough In setting of heart failure. Improved. No current concern for pneumonia.  AKI Baseline creatinine of 1.1. Creatinine of 1.66 on admission and trending down with diuresis -BMP daily while diuresing  CAD -Continue aspirin, Plavix -Held Lipitor secondary to thigh pain; CK normal. Will see if any improvement prior to restarting  Essential hypertension Patient is on prazosin, Coreg and Bidil -Hold Coreg in setting of cocaine use   DVT prophylaxis: Lovenox Code Status:   Code Status: Full Code Family Communication: None at bedside Disposition Plan: Discharge likely in 1-3 days pending continued diuresis, cardiology recommendations   Consultants:   Cardiology  Procedures:   None  Antimicrobials:  None     Subjective: Continued thigh pain. No other concerns today.  Objective: Vitals:   10/16/20 2052 10/17/20 0424 10/17/20 0432 10/17/20 1048  BP: (!) 134/97 (!) 145/102  (!) 132/104  Pulse: 99 98  (!) 102  Resp: 20 20  16   Temp: 98 F (36.7 C) 98 F (36.7 C)  (!) 97.4 F (36.3 C)  TempSrc:  Oral  Oral  SpO2: 100% 100%  97%  Weight:   117.8 kg   Height:        Intake/Output Summary (Last 24 hours) at 10/17/2020 1101 Last data filed at 10/17/2020 0500 Gross per 24 hour  Intake 840 ml  Output 1400 ml  Net -560 ml   Filed Weights   10/15/20 0514 10/17/20 0432  Weight: 119.7 kg 117.8 kg    Examination:  General exam: Appears calm and comfortable Respiratory system: Clear to auscultation. Respiratory effort normal. Cardiovascular system: S1 & S2 heard, RRR. No murmurs, rubs, gallops or clicks. Gastrointestinal system: Abdomen is nondistended, soft and nontender. No organomegaly or masses felt. Normal bowel sounds heard. Central nervous system: Alert and oriented. No focal neurological deficits. Musculoskeletal: 1-2+ BLE edema. No calf tenderness. Left thigh tenderness. More thorough leg exam performed on 12/13 Skin: No cyanosis. No rashes Psychiatry: Judgement and insight appear normal. Mood & affect appropriate.     Data Reviewed: I have personally reviewed following labs and imaging studies  CBC Lab Results  Component Value Date   WBC 6.5 10/16/2020   RBC 4.36 10/16/2020   HGB 12.2 (L) 10/16/2020   HCT 38.1 (L) 10/16/2020   MCV 87.4 10/16/2020   MCH 28.0 10/16/2020   PLT 275 10/16/2020   MCHC 32.0 10/16/2020  RDW 15.0 10/16/2020   LYMPHSABS 1.3 10/15/2020   MONOABS 0.7 10/15/2020   EOSABS 0.1 10/15/2020   BASOSABS 0.0 10/15/2020     Last metabolic panel Lab Results  Component Value Date   NA 137 10/17/2020   K 3.7 10/17/2020   CL 106 10/17/2020   CO2 21 (L) 10/17/2020   BUN 25 (H) 10/17/2020   CREATININE 1.36 (H) 10/17/2020   GLUCOSE 98  10/17/2020   GFRNONAA >60 10/17/2020   GFRAA >60 06/24/2019   CALCIUM 8.2 (L) 10/17/2020   PHOS 4.6 09/28/2018   PROT 5.6 (L) 10/16/2020   ALBUMIN 2.8 (L) 10/16/2020   BILITOT 0.7 10/16/2020   ALKPHOS 49 10/16/2020   AST 16 10/16/2020   ALT 16 10/16/2020   ANIONGAP 10 10/17/2020    CBG (last 3)  No results for input(s): GLUCAP in the last 72 hours.   GFR: Estimated Creatinine Clearance: 78.2 mL/min (A) (by C-G formula based on SCr of 1.36 mg/dL (H)).  Coagulation Profile: No results for input(s): INR, PROTIME in the last 168 hours.  Recent Results (from the past 240 hour(s))  Resp Panel by RT-PCR (Flu A&B, Covid) Nasopharyngeal Swab     Status: None   Collection Time: 10/15/20 10:51 AM   Specimen: Nasopharyngeal Swab; Nasopharyngeal(NP) swabs in vial transport medium  Result Value Ref Range Status   SARS Coronavirus 2 by RT PCR NEGATIVE NEGATIVE Final    Comment: (NOTE) SARS-CoV-2 target nucleic acids are NOT DETECTED.  The SARS-CoV-2 RNA is generally detectable in upper respiratory specimens during the acute phase of infection. The lowest concentration of SARS-CoV-2 viral copies this assay can detect is 138 copies/mL. A negative result does not preclude SARS-Cov-2 infection and should not be used as the sole basis for treatment or other patient management decisions. A negative result may occur with  improper specimen collection/handling, submission of specimen other than nasopharyngeal swab, presence of viral mutation(s) within the areas targeted by this assay, and inadequate number of viral copies(<138 copies/mL). A negative result must be combined with clinical observations, patient history, and epidemiological information. The expected result is Negative.  Fact Sheet for Patients:  BloggerCourse.com  Fact Sheet for Healthcare Providers:  SeriousBroker.it  This test is no t yet approved or cleared by the [place] FDA and  has been authorized for detection and/or diagnosis of SARS-CoV-2 by FDA under an Emergency Use Authorization (EUA). This EUA will remain  in effect (meaning this test can be used) for the duration of the COVID-19 declaration under Section 564(b)(1) of the Act, 21 U.S.C.section 360bbb-3(b)(1), unless the authorization is terminated  or revoked sooner.       Influenza A by PCR NEGATIVE NEGATIVE Final   Influenza B by PCR NEGATIVE NEGATIVE Final    Comment: (NOTE) The Xpert Xpress SARS-CoV-2/FLU/RSV plus assay is intended as an aid in the diagnosis of influenza from Nasopharyngeal swab specimens and should not be used as a sole basis for treatment. Nasal washings and aspirates are unacceptable for Xpert Xpress SARS-CoV-2/FLU/RSV testing.  Fact Sheet for Patients: BloggerCourse.com  Fact Sheet for Healthcare Providers: SeriousBroker.it  This test is not yet approved or cleared by the [place] FDA and has been authorized for detection and/or diagnosis of SARS-CoV-2 by FDA under an Emergency Use Authorization (EUA). This EUA will remain in effect (meaning this test can be used) for the duration of the COVID-19 declaration under Section 564(b)(1) of the Act, 21 U.S.C. section 360bbb-3(b)(1), unless the authorization is terminated or  revoked.  Performed at [facility], [address] [place], [place] [address]         Radiology Studies: US RENAL  Result Date: 10/15/2020 CLINICAL DATA:  Acute kidney injury EXAM: RENAL / URINARY TRACT ULTRASOUND COMPLETE COMPARISON:  CT renal colic 05/07/2019 FINDINGS: Right Kidney: Renal measurements: 10.6 x 7 x 6.8 = volume: 260 mL. Echogenicity is within normal limits. No concerning renal mass, shadowing calculus or hydronephrosis. Left Kidney: Renal measurements: 10.4 x 5.4 x 5.4 cm = volume: 160 mL. Echogenicity is within normal limits. No concerning  renal mass, shadowing calculus or hydronephrosis. Bladder: Unremarkable for the degree of distention. Other: None. IMPRESSION: Unremarkable renal ultrasound. Electronically Signed   By: Kreg Shropshire M.D.   On: 10/15/2020 15:24        Scheduled Meds: . aspirin EC  81 mg Oral Daily  . enoxaparin (LOVENOX) injection  60 mg Subcutaneous Q24H  . furosemide  40 mg Intravenous Daily  . isosorbide-hydrALAZINE  1 tablet Oral TID  . pantoprazole  40 mg Oral Daily  . potassium chloride  20 mEq Oral Daily   Continuous Infusions:   LOS: 2 days     Jacquelin Hawking, MD [facility] 10/17/2020, 11:01 AM  If 7PM-7AM, please contact night-coverage [email or web address]

## 2020-10-17 NOTE — Progress Notes (Signed)
Notified by CCMD that pt had 5 beat runs of VTACH, pt sleeping but easily arousable & no signs of distress noted. Notified MD Katherina Right.

## 2020-10-18 ENCOUNTER — Inpatient Hospital Stay (HOSPITAL_COMMUNITY): Payer: Medicaid Other

## 2020-10-18 LAB — BASIC METABOLIC PANEL
Anion gap: 11 (ref 5–15)
BUN: 22 mg/dL — ABNORMAL HIGH (ref 6–20)
CO2: 23 mmol/L (ref 22–32)
Calcium: 8.2 mg/dL — ABNORMAL LOW (ref 8.9–10.3)
Chloride: 104 mmol/L (ref 98–111)
Creatinine, Ser: 1.26 mg/dL — ABNORMAL HIGH (ref 0.61–1.24)
GFR, Estimated: >60 mL/min (ref >60)
Glucose, Bld: 94 mg/dL (ref 70–99)
Potassium: 3.7 mmol/L (ref 3.5–5.1)
Sodium: 138 mmol/L (ref 135–145)

## 2020-10-18 MED ORDER — ACETAMINOPHEN 325 MG PO TABS
650.0000 mg | ORAL_TABLET | Freq: Four times a day (QID) | ORAL | Status: DC | PRN
  Administered 2020-10-19 – 2020-10-20 (×3): 650 mg via ORAL
  Filled 2020-10-18 (×3): qty 2

## 2020-10-18 MED ORDER — CARVEDILOL 3.125 MG PO TABS
3.1250 mg | ORAL_TABLET | Freq: Two times a day (BID) | ORAL | Status: DC
  Administered 2020-10-18 – 2020-10-19 (×3): 3.125 mg via ORAL
  Filled 2020-10-18 (×3): qty 1

## 2020-10-18 MED ORDER — LORAZEPAM 2 MG/ML IJ SOLN
1.0000 mg | INTRAMUSCULAR | Status: AC | PRN
  Administered 2020-10-18 – 2020-10-19 (×3): 1 mg via INTRAVENOUS
  Filled 2020-10-18 (×3): qty 1

## 2020-10-18 MED ORDER — FUROSEMIDE 10 MG/ML IJ SOLN
40.0000 mg | Freq: Two times a day (BID) | INTRAMUSCULAR | Status: DC
  Administered 2020-10-18 – 2020-10-20 (×4): 40 mg via INTRAVENOUS
  Filled 2020-10-18 (×4): qty 4

## 2020-10-18 MED ORDER — POTASSIUM CHLORIDE CRYS ER 20 MEQ PO TBCR
40.0000 meq | EXTENDED_RELEASE_TABLET | Freq: Every day | ORAL | Status: DC
  Administered 2020-10-19 – 2020-10-21 (×3): 40 meq via ORAL
  Filled 2020-10-18 (×3): qty 2

## 2020-10-18 NOTE — Progress Notes (Signed)
PROGRESS NOTE    Dale [name]  ZMO:[id number] DOB: 06/14/1961 DOA: 10/15/2020 PCP: Grayce Sessions, NP     Brief Narrative:  Dale [name] is a 59 y.o. male with medical history significant of cocaine abuse, chronic combined CHF, CAD. He presents with 3 days of edema in BLE. He says he has not changed his diet or done anything different in the last three days. He tried to take his torsemide to help, but it doesn't change anything. He tried elevating his legs, but this only gives temporary relief. He reports cough w/ some mucus production over the same time. Tried delsym and robitussin, but only partial relief. Over the last day or so he reports groin pain w/ walking. He has tried no intervention for that. Work up revealed elevated BNP.  Patient was admitted due to CHF exacerbation.  Cardiology was consulted.  New events last 24 hours / Subjective: States that he cannot sleep due to pain in his left thigh.  It radiates from his left groin and worsens with weightbearing ambulation.  Continues to complain of shortness of breath.  Assessment & Plan:   Principal Problem:   Acute on chronic combined systolic and diastolic HF (heart failure) (HCC) Active Problems:   Essential hypertension   Cocaine abuse (HCC)   Hyperlipidemia   AKI (acute kidney injury) (HCC)    Acute on chronic combined systolic and diastolic heart failure -Last EF 20% with grade III diastolic dysfunction. Elevated BNP 3415.1 on admission. Mildly elevated but flat troponin. Started on Lasix IV. Patient with a history of on-going cocaine abuse. He is on Coreg, spironolactone and torsemide -Continue Lasix IV, Coreg -Strict I's and O's, daily weight -Cardiology following  Left thigh pain -Venous duplex negative for DVT -X-ray revealed degenerative changes in left hip, knee without acute abnormality -Continues to complain of severe pain with ambulation.  Check CT to rule out occult fracture  AKI -Baseline  creatinine of 1.1. Creatinine of 1.66 on admission and trending down with diuresis -Renal ultrasound unremarkable -Creatinine improving  CAD -Continue aspirin, Lipitor  Essential hypertension -Continue Coreg and Bidil  Demand ischemia -Troponin trend is flat  Concern for OSA -Recommend outpatient sleep study  Polysubstance abuse -Reported alcohol, tobacco, cocaine use    DVT prophylaxis: Lovenox   Code Status: Full Family Communication: No family at bedside Disposition Plan:  Status is: Inpatient  Remains inpatient appropriate because:Ongoing active pain requiring inpatient pain management, Unsafe d/c plan, IV treatments appropriate due to intensity of illness or inability to take PO and Inpatient level of care appropriate due to severity of illness   Dispo: The patient is from: Home              Anticipated d/c is to: SNF              Anticipated d/c date is: 2 days              Patient currently is not medically stable to d/c.  Continue diuresis per cardiology.  Further work-up for his left lower extremity pain with CT today.  SNF placement pending.   Consultants:   Cardiology  Procedures:   None  Antimicrobials:  Anti-infectives (From admission, onward)   None        Objective: Vitals:   10/17/20 2122 10/18/20 0322 10/18/20 0500 10/18/20 0629  BP: (!) 132/93 (!) 131/98  (!) 149/102  Pulse: 97 95  (!) 106  Resp: 20 (!) 22  20  Temp: 97.7 F (36.5  C) 98.2 F (36.8 C)  98.5 F (36.9 C)  TempSrc: Oral Oral  Oral  SpO2: 100% 98%  96%  Weight:   117.5 kg   Height:        Intake/Output Summary (Last 24 hours) at 10/18/2020 1052 Last data filed at 10/18/2020 0926 Gross per 24 hour  Intake 600 ml  Output 2850 ml  Net -2250 ml   Filed Weights   10/15/20 0514 10/17/20 0432 10/18/20 0500  Weight: 119.7 kg 117.8 kg 117.5 kg    Examination:  General exam: Appears calm and comfortable  Respiratory system: Clear to auscultation. Respiratory  effort normal. No respiratory distress. No conversational dyspnea.  Cardiovascular system: S1 & S2 heard, RRR. No murmurs. Trace pedal edema. Gastrointestinal system: Abdomen is nondistended, soft and nontender. Normal bowel sounds heard. Central nervous system: Alert and oriented. No focal neurological deficits. Speech clear.  Extremities: Symmetric in appearance, no gross abnormality of LEs Skin: No rashes, lesions or ulcers on exposed skin  Psychiatry: Judgement and insight appear normal.   Data Reviewed: I have personally reviewed following labs and imaging studies  CBC: Recent Labs  Lab 10/15/20 0700 10/16/20 0417  WBC 7.4 6.5  NEUTROABS 5.3  --   HGB 12.5* 12.2*  HCT 38.9* 38.1*  MCV 87.0 87.4  PLT 309 275   Basic Metabolic Panel: Recent Labs  Lab 10/15/20 0700 10/16/20 0417 10/17/20 0358 10/18/20 0421  NA 139 139 137 138  K 3.5 3.5 3.7 3.7  CL 106 104 106 104  CO2 22 24 21* 23  GLUCOSE 112* 119* 98 94  BUN 29* 26* 25* 22*  CREATININE 1.66* 1.61* 1.36* 1.26*  CALCIUM 8.4* 8.2* 8.2* 8.2*  MG  --   --  2.1  --    GFR: Estimated Creatinine Clearance: 84.4 mL/min (A) (by C-G formula based on SCr of 1.26 mg/dL (H)). Liver Function Tests: Recent Labs  Lab 10/16/20 0417  AST 16  ALT 16  ALKPHOS 49  BILITOT 0.7  PROT 5.6*  ALBUMIN 2.8*   No results for input(s): LIPASE, AMYLASE in the last 168 hours. No results for input(s): AMMONIA in the last 168 hours. Coagulation Profile: No results for input(s): INR, PROTIME in the last 168 hours. Cardiac Enzymes: Recent Labs  Lab 10/17/20 0358  CKTOTAL 82   BNP (last 3 results) No results for input(s): PROBNP in the last 8760 hours. HbA1C: No results for input(s): HGBA1C in the last 72 hours. CBG: No results for input(s): GLUCAP in the last 168 hours. Lipid Profile: No results for input(s): CHOL, HDL, LDLCALC, TRIG, CHOLHDL, LDLDIRECT in the last 72 hours. Thyroid Function Tests: No results for input(s): TSH,  T4TOTAL, FREET4, T3FREE, THYROIDAB in the last 72 hours. Anemia Panel: No results for input(s): VITAMINB12, FOLATE, FERRITIN, TIBC, IRON, RETICCTPCT in the last 72 hours. Sepsis Labs: No results for input(s): PROCALCITON, LATICACIDVEN in the last 168 hours.  Recent Results (from the past 240 hour(s))  Resp Panel by RT-PCR (Flu A&B, Covid) Nasopharyngeal Swab     Status: None   Collection Time: 10/15/20 10:51 AM   Specimen: Nasopharyngeal Swab; Nasopharyngeal(NP) swabs in vial transport medium  Result Value Ref Range Status   SARS Coronavirus 2 by RT PCR NEGATIVE NEGATIVE Final    Comment: (NOTE) SARS-CoV-2 target nucleic acids are NOT DETECTED.  The SARS-CoV-2 RNA is generally detectable in upper respiratory specimens during the acute phase of infection. The lowest concentration of SARS-CoV-2 viral copies this assay can detect is  138 copies/mL. A negative result does not preclude SARS-Cov-2 infection and should not be used as the sole basis for treatment or other patient management decisions. A negative result may occur with  improper specimen collection/handling, submission of specimen other than nasopharyngeal swab, presence of viral mutation(s) within the areas targeted by this assay, and inadequate number of viral copies(<138 copies/mL). A negative result must be combined with clinical observations, patient history, and epidemiological information. The expected result is Negative.  Fact Sheet for Patients:  BloggerCourse.com  Fact Sheet for Healthcare Providers:  SeriousBroker.it  This test is no t yet approved or cleared by the [place] FDA and  has been authorized for detection and/or diagnosis of SARS-CoV-2 by FDA under an Emergency Use Authorization (EUA). This EUA will remain  in effect (meaning this test can be used) for the duration of the COVID-19 declaration under Section 564(b)(1) of the Act, 21 U.S.C.section  360bbb-3(b)(1), unless the authorization is terminated  or revoked sooner.       Influenza A by PCR NEGATIVE NEGATIVE Final   Influenza B by PCR NEGATIVE NEGATIVE Final    Comment: (NOTE) The Xpert Xpress SARS-CoV-2/FLU/RSV plus assay is intended as an aid in the diagnosis of influenza from Nasopharyngeal swab specimens and should not be used as a sole basis for treatment. Nasal washings and aspirates are unacceptable for Xpert Xpress SARS-CoV-2/FLU/RSV testing.  Fact Sheet for Patients: BloggerCourse.com  Fact Sheet for Healthcare Providers: SeriousBroker.it  This test is not yet approved or cleared by the [place] FDA and has been authorized for detection and/or diagnosis of SARS-CoV-2 by FDA under an Emergency Use Authorization (EUA). This EUA will remain in effect (meaning this test can be used) for the duration of the COVID-19 declaration under Section 564(b)(1) of the Act, 21 U.S.C. section 360bbb-3(b)(1), unless the authorization is terminated or revoked.  Performed at [facility], [address] [place], [place] [address]       Radiology Studies: DG FEMUR 1V LEFT  Result Date: 10/17/2020 CLINICAL DATA:  Left upper thigh/groin pain. EXAM: LEFT FEMUR 1 VIEW COMPARISON:  No prior. FINDINGS: Corticated tiny lucency in the lower femur most likely vascular marking. No acute bony abnormality. No evidence of fracture or dislocation. Degenerative changes left hip and knee. IMPRESSION: Degenerative changes left hip and knee. No acute abnormality. Electronically Signed   By: Maisie Fus  Register   On: 10/17/2020 15:37      Scheduled Meds: . aspirin EC  81 mg Oral Daily  . atorvastatin  40 mg Oral QHS  . carvedilol  3.125 mg Oral BID WC  . enoxaparin (LOVENOX) injection  60 mg Subcutaneous Q24H  . furosemide  40 mg Intravenous BID  . isosorbide-hydrALAZINE  1 tablet Oral TID  . pantoprazole  40 mg  Oral Daily  . [START ON 10/19/2020] potassium chloride  40 mEq Oral Daily   Continuous Infusions:   LOS: 3 days      Time spent: 35 minutes   Noralee Stain, DO [facility] 10/18/2020, 10:52 AM   Available via Epic secure chat 7am-7pm After these hours, please refer to coverage provider listed on [email or web address]

## 2020-10-18 NOTE — TOC Progression Note (Addendum)
Transition of Care [facility]) - Progression Note    Patient Details  Name: Dale [name] MRN: [id number] Date of Birth: 02-25-1961  [facility] CM/SW Contact  Darleene Cleaver, [place] Phone Number: 10/18/2020, 6:20 PM  Clinical Narrative:     CSW received consult that patient will need SNF.  Patient will not be able to get SNF placement due to having Medicaid.  Patient will have to go outpatient PT.  Patient also states that he has trouble getting to MD appointments, patient has Medicaid, he can sign up for Medicaid transportation.  Patient has Medicaid, no assistance can be provided for patient's medications.     Barriers to Discharge: No Barriers Identified  Expected Discharge Plan and Services                                                 Social Determinants of Health (SDOH) Interventions    Readmission Risk Interventions Readmission Risk Prevention Plan 09/11/2020 03/03/2019  Transportation Screening Complete Complete  PCP or Specialist Appt within 3-5 Days Complete -  HRI or Home Care Consult Complete -  Social Work Consult for Recovery Care Planning/Counseling Complete -  Palliative Care Screening Complete -  Medication Review [facility]) Complete Complete  PCP or Specialist appointment within 3-5 days of discharge - Complete  HRI or Home Care Consult - (No Data)  SW Recovery Care/Counseling Consult - Complete  Palliative Care Screening - Not Applicable  Skilled Nursing Facility - Not Applicable  Some recent data might be hidden

## 2020-10-18 NOTE — Progress Notes (Signed)
Pt states that he's getting anxious and wanting to smoke. Noted that pt is getting restless and agitated, notified MD on call and ordered 1mg  of ativan PRN.  Ativan administered to pt and  will continue to monitor.

## 2020-10-18 NOTE — Progress Notes (Addendum)
Progress Note  Patient Name: Dale [name] Date of Encounter: 10/18/2020  [facility] Cardiologist: Dale Miss, MD   Subjective   No acute overnight events. Patient reports continued shortness of breath that is keeping him from sleeping. Describes orthopnea. Denies any worsening of breathing but also states it is not getting better. There is a nursing note overnight of patient getting more anxious and agitated which may be playing a role. No chest pain. He notes occasional palpitations but denies any lightheadedness, dizziness. He also report leg pain. Primary team ordered x-ray which just showed degenerative changes. Also looks like CT has been ordered.  Inpatient Medications    Scheduled Meds: . aspirin EC  81 mg Oral Daily  . atorvastatin  40 mg Oral QHS  . enoxaparin (LOVENOX) injection  60 mg Subcutaneous Q24H  . furosemide  40 mg Intravenous Daily  . isosorbide-hydrALAZINE  1 tablet Oral TID  . pantoprazole  40 mg Oral Daily  . potassium chloride  20 mEq Oral Daily   Continuous Infusions:  PRN Meds: LORazepam, ondansetron **OR** ondansetron (ZOFRAN) IV   Vital Signs    Vitals:   10/17/20 2122 10/18/20 0322 10/18/20 0500 10/18/20 0629  BP: (!) 132/93 (!) 131/98  (!) 149/102  Pulse: 97 95  (!) 106  Resp: 20 (!) 22  20  Temp: 97.7 F (36.5 C) 98.2 F (36.8 C)  98.5 F (36.9 C)  TempSrc: Oral Oral  Oral  SpO2: 100% 98%  96%  Weight:   117.5 kg   Height:        Intake/Output Summary (Last 24 hours) at 10/18/2020 0842 Last data filed at 10/18/2020 0600 Gross per 24 hour  Intake 360 ml  Output 2050 ml  Net -1690 ml   Last 3 Weights 10/18/2020 10/17/2020 10/15/2020  Weight (lbs) 259 lb 0.7 oz 259 lb 11.2 oz 263 lb 14.3 oz  Weight (kg) 117.5 kg 117.8 kg 119.7 kg      Telemetry    Sinus rhythm with rates in the 90's to 110's. Short run of NSVT noted (4 beats). Also looks like he had a couple of episodes of 2nd degree AV block type 2 (PR looked like it  was prolonging before one dropped beat but at other times PR looked consistent before dropped beat).  - Personally Reviewed  ECG    No new ECG tracing today. - Personally Reviewed  Physical Exam   GEN: No acute distress.   Neck: JVD possibly elevated. Cardiac: RRR. No murmurs, rubs, or gallops.  Respiratory: Tachypneic at times. Clear to auscultation bilaterally. No wheezes, rhonchi, or rales.  GI: Soft, non-distended, and non-tender.  MS: Trace lower extremity edema. No deformity. Skin: Warm and dry. Neuro:  No focal deficits. Psych: Normal affect. Responds appropriately.  Labs    High Sensitivity Troponin:   Recent Labs  Lab 10/15/20 0700 10/15/20 0905  TROPONINIHS 30* 31*      Chemistry Recent Labs  Lab 10/16/20 0417 10/17/20 0358 10/18/20 0421  NA 139 137 138  K 3.5 3.7 3.7  CL 104 106 104  CO2 24 21* 23  GLUCOSE 119* 98 94  BUN 26* 25* 22*  CREATININE 1.61* 1.36* 1.26*  CALCIUM 8.2* 8.2* 8.2*  PROT 5.6*  --   --   ALBUMIN 2.8*  --   --   AST 16  --   --   ALT 16  --   --   ALKPHOS 49  --   --  BILITOT 0.7  --   --   GFRNONAA 50* >60 >60  ANIONGAP 11 10 11      Hematology Recent Labs  Lab 10/15/20 0700 10/16/20 0417  WBC 7.4 6.5  RBC 4.47 4.36  HGB 12.5* 12.2*  HCT 38.9* 38.1*  MCV 87.0 87.4  MCH 28.0 28.0  MCHC 32.1 32.0  RDW 15.2 15.0  PLT 309 275    BNP Recent Labs  Lab 10/15/20 0700  BNP 3,415.1*     DDimer No results for input(s): DDIMER in the last 168 hours.   Radiology    DG FEMUR 1V LEFT  Result Date: 10/17/2020 CLINICAL DATA:  Left upper thigh/groin pain. EXAM: LEFT FEMUR 1 VIEW COMPARISON:  No prior. FINDINGS: Corticated tiny lucency in the lower femur most likely vascular marking. No acute bony abnormality. No evidence of fracture or dislocation. Degenerative changes left hip and knee. IMPRESSION: Degenerative changes left hip and knee. No acute abnormality. Electronically Signed   By: 10/19/2020  Register   On: 10/17/2020  15:37    Cardiac Studies   Echocardiogram 09/09/2020: Impressions: 1. Left ventricular ejection fraction, by estimation, is approximately  20%. The left ventricle has severely decreased function. The left  ventricle demonstrates global hypokinesis with regional variation as  noted. The left ventricular internal cavity  size was moderately dilated. Left ventricular diastolic parameters are  consistent with Grade III diastolic dysfunction (restrictive). No obvious  mural thrombus noted with Definity contrast.  2. Right ventricular systolic function is moderately to severely reduced.  The right ventricular size is normal. There is moderately elevated  pulmonary artery systolic pressure. The estimated right ventricular  systolic pressure is 52.0 mmHg.  3. Left atrial size was mild to moderately dilated.  4. Right atrial size was mild to moderately dilated.  5. The mitral valve is grossly normal. Mild mitral valve regurgitation.  6. The aortic valve is tricuspid. Aortic valve regurgitation is trivial.  7. The inferior vena cava is dilated in size with <50% respiratory  variability, suggesting right atrial pressure of 15 mmHg.  _______________  Lower Extremity Dopplers 10/15/2020: Summary:  RIGHT:  - No evidence of common femoral vein obstruction.    LEFT:  - There is no evidence of deep vein thrombosis in the lower extremity.  - No cystic structure found in the popliteal fossa.     Patient Profile   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 06/2019.   Assessment & Plan   Acute on Chronic Combined CHF  - BNP elevated at 3,415.  - Chest x-ray showed cardiomegaly with central pulmonary  vascular congestion and hazy bibasilar opacities.  - Echo last month during another admission for CHF showed LVEF of 20% with global hypokinesis and grade 3 diastolic dysfunction. RV systolic function severely reduced and PASP moderately elevated.  - Patient currently on IV Lasix 40mg  daily. Documented urinary output of 2L in the past 24 hours and net negative 6L since admission. Weight unchanged from yesterday but down 4lbs since discharge. Renal function improving with diuresis. - Patient reports continued shortness of breath (no improvement sicne admission) and is tachypneic at times during my exam. Will increase IV Lasix to 40mg  twice daily.  -  Will hold off on starting ACEi/ARB while we increase diuresis. - Home Bidil restarted.  - No beta-blocker given cocaine use.  - Continue daily weight, strict I/O's, and renal function.  - Unfortunately, patient's lifestyle choices are not conducive to healthy heart. Importance of alcohol and cocaine use has been discussed. Patient is not a candidate for ICD or advanced heart failure therapy given ongoing substance abuse. Social work has been consulted to assist with stable living and medication situation.   CAD  Demand Ischemic  - R/LHC in 05/2019 showed single vessel CAD with CTO of RCA with collaterals.  - High-sensitivity troponin minimally elevated and flat at 30 >> 31. Not consistent with ACS. Likely demand ischemia in setting of CHF.  - No chest pain.  - Continue aspirin and high-intensity statin. No beta-blocker given cocaine use.  2nd Degree AV Block - Patient had a couple of brief episodes of bradycardia as well as 2nd degree AV block overnight. PR looked like it was prolonging before one dropped beat but at other times PR looked consistent length before dropped beat. - Patient reports snoring and given body habitus, suspect he may have sleep apnea. - Will continue to monitor on telemetry.   Non-Sustained VT - Short run of non-sustained  VT. - Potassium 3.7 today. Will increase daily dose of K-Dur since I am increasing Lasix. - Magnesium 2.1 yesterday. - Unfortunately not a good beta-blocker candidate given cocaine use. - Will continue to monitor for prolonged runs of NSVT.  Hypertension  - BP mildly elevated. - Hopefully this will improve with continued diuresis. - May need to increase Bidil to 2 tablets three times daily.   Hyperlipidemia  - Lipid panel from 09/2020: Total Cholesterol 164, Triglycerides 38, HDL 45, LDL 111.  - LDL goal <70 given CAD.  - Continue Lipitor.   AKI - Creatinine 1.66 on admission. Baseline around 1.1.  - Down to 1.26 today with diuresis.  - Continue to monitor.  Polysubstance Abuse  - Patient reports tobacco, alcohol, and cocaine use and admits that it will be extremely hard for him to staff off drugs in his current living situation.  - Importance of complete cessation has been discussed.  - Social work has been consulted.    For questions or updates, please contact [facility] Please consult [email or web address] for contact info under        Signed, Corrin Parker, PA-C  10/18/2020, 8:42 AM

## 2020-10-18 NOTE — Plan of Care (Signed)
  Problem: Education: Goal: Knowledge of General Education information will improve Description: Including pain rating scale, medication(s)/side effects and non-pharmacologic comfort measures Outcome: Progressing   Problem: Health Behavior/Discharge Planning: Goal: Ability to manage health-related needs will improve Outcome: Progressing   Problem: Clinical Measurements: Goal: Respiratory complications will improve Outcome: Progressing   Problem: Clinical Measurements: Goal: Cardiovascular complication will be avoided Outcome: Progressing   Problem: Activity: Goal: Risk for activity intolerance will decrease Outcome: Progressing   Problem: Pain Managment: Goal: General experience of comfort will improve Outcome: Progressing   Problem: Activity: Goal: Capacity to carry out activities will improve Outcome: Progressing

## 2020-10-18 NOTE — Progress Notes (Signed)
  Message sent to MD regarding tele report of 7 beat run of vtach, verified, and reported. Awaiting any new orders if any to be give. Pt is asymptomatic, eating and watching television. Will continue to monitor.

## 2020-10-19 DIAGNOSIS — I10 Essential (primary) hypertension: Secondary | ICD-10-CM

## 2020-10-19 DIAGNOSIS — E782 Mixed hyperlipidemia: Secondary | ICD-10-CM

## 2020-10-19 LAB — BASIC METABOLIC PANEL
Anion gap: 9 (ref 5–15)
BUN: 24 mg/dL — ABNORMAL HIGH (ref 6–20)
CO2: 23 mmol/L (ref 22–32)
Calcium: 8.4 mg/dL — ABNORMAL LOW (ref 8.9–10.3)
Chloride: 105 mmol/L (ref 98–111)
Creatinine, Ser: 1.36 mg/dL — ABNORMAL HIGH (ref 0.61–1.24)
GFR, Estimated: >60 mL/min (ref >60)
Glucose, Bld: 144 mg/dL — ABNORMAL HIGH (ref 70–99)
Potassium: 3.4 mmol/L — ABNORMAL LOW (ref 3.5–5.1)
Sodium: 137 mmol/L (ref 135–145)

## 2020-10-19 MED ORDER — POTASSIUM CHLORIDE CRYS ER 20 MEQ PO TBCR
20.0000 meq | EXTENDED_RELEASE_TABLET | Freq: Once | ORAL | Status: AC
  Administered 2020-10-19: 20 meq via ORAL
  Filled 2020-10-19: qty 1

## 2020-10-19 MED ORDER — GABAPENTIN 300 MG PO CAPS
300.0000 mg | ORAL_CAPSULE | Freq: Every day | ORAL | Status: DC
  Administered 2020-10-19 – 2020-10-20 (×2): 300 mg via ORAL
  Filled 2020-10-19 (×2): qty 1

## 2020-10-19 NOTE — Progress Notes (Signed)
PROGRESS NOTE    Dale [name]  ZOX:[id number] DOB: 09-18-1961 DOA: 10/15/2020 PCP: Grayce Sessions, NP     Brief Narrative:  Dale [name] is a 59 y.o. male with medical history significant of cocaine abuse, chronic combined CHF, CAD. He presents with 3 days of edema in BLE. He says he has not changed his diet or done anything different in the last three days. He tried to take his torsemide to help, but it doesn't change anything. He tried elevating his legs, but this only gives temporary relief. He reports cough w/ some mucus production over the same time. Tried delsym and robitussin, but only partial relief. Over the last day or so he reports groin pain w/ walking. He has tried no intervention for that. Work up revealed elevated BNP.  Patient was admitted due to CHF exacerbation.  Cardiology was consulted.  New events last 24 hours / Subjective: Continues to complain of left leg pain and shortness of breath.  Very upset that he is only being offered Tylenol for his pain.  I discussed with him that we are limited in pain medication regimen due to his kidney injury as well as limiting narcotic use for nonacute pain.  Urine output 2700 mL over last 24 hours  Assessment & Plan:   Principal Problem:   Acute on chronic combined systolic and diastolic HF (heart failure) (HCC) Active Problems:   Essential hypertension   Cocaine abuse (HCC)   Hyperlipidemia   AKI (acute kidney injury) (HCC)    Acute on chronic combined systolic and diastolic heart failure -Last EF 20% with grade III diastolic dysfunction. Elevated BNP 3415.1 on admission. Mildly elevated but flat troponin. Started on Lasix IV. Patient with a history of on-going cocaine abuse. He is on Coreg, spironolactone and torsemide -Continue Lasix IV, Coreg -Strict I's and O's, daily weight -Cardiology following  Left thigh pain -Venous duplex negative for DVT -CK 82 -X-ray revealed degenerative changes in left hip, knee  without acute abnormality -CT left hip and femur without acute osseous abnormality.  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 Creatinine of 1.66 on admission and trending down with diuresis -Renal ultrasound unremarkable -Creatinine improved and has plateaued at 1.3 will monitor  CAD -Continue aspirin, Lipitor  Essential hypertension -Continue Coreg and Bidil  Demand ischemia -Troponin trend is flat  Concern for OSA -Recommend outpatient sleep study  Polysubstance abuse -Reported alcohol, tobacco, cocaine use  Hypokalemia -Replace, trend   DVT prophylaxis: Lovenox   Code Status: Full Family Communication: No family at bedside Disposition Plan:  Status is: Inpatient  Remains inpatient appropriate because:IV treatments appropriate due to intensity of illness or inability to take PO and Inpatient level of care appropriate due to severity of illness   Dispo: The patient is from: Home              Anticipated d/c is to: Home.  Does not qualify for SNF due to Medicaid status              Anticipated d/c date is: 2 days              Patient currently is not medically stable to d/c.  Continue diuresis per cardiology.   Consultants:   Cardiology  Procedures:  None  Antimicrobials:  Anti-infectives (From admission, onward)   None       Objective: Vitals:   10/18/20 0629 10/18/20 1242 10/18/20 2009 10/19/20 0510  BP: (!) 149/102 (!) 135/98 (!) 128/99 (!) 136/94  Pulse: (!) 106 (!) 106 (!) 105 92  Resp: 20 18 20    Temp: 98.5 F (36.9 C) 97.7 F (36.5 C) 98.1 F (36.7 C) 97.9 F (36.6 C)  TempSrc: Oral Oral Oral Oral  SpO2: 96% 95% 97% 99%  Weight:      Height:        Intake/Output  Summary (Last 24 hours) at 10/19/2020 1216 Last data filed at 10/18/2020 2010 Gross per 24 hour  Intake 480 ml  Output 1900 ml  Net -1420 ml   Filed Weights   10/15/20 0514 10/17/20 0432 10/18/20 0500  Weight: 119.7 kg 117.8 kg 117.5 kg    Examination: General exam: Appears comfortable  Respiratory system: Clear to auscultation. Respiratory effort normal. Cardiovascular system: S1 & S2 heard, RRR.  Gastrointestinal system: Abdomen is nondistended, soft and nontender. Normal bowel sounds heard. Central nervous system: Alert and oriented. Non focal exam. Speech clear  Extremities: Symmetric in appearance bilaterally  Skin: No rashes, lesions or ulcers on exposed skin  Psychiatry: Judgement and insight appear stable. Mood & affect appropriate.   Data Reviewed: I have personally reviewed following labs and imaging studies  CBC: Recent Labs  Lab 10/15/20 0700 10/16/20 0417  WBC 7.4 6.5  NEUTROABS 5.3  --   HGB 12.5* 12.2*  HCT 38.9* 38.1*  MCV 87.0 87.4  PLT 309 275   Basic Metabolic Panel: Recent Labs  Lab 10/15/20 0700 10/16/20 0417 10/17/20 0358 10/18/20 0421 10/19/20 0418  NA 139 139 137 138 137  K 3.5 3.5 3.7 3.7 3.4*  CL 106 104 106 104 105  CO2 22 24 21* 23 23  GLUCOSE 112* 119* 98 94 144*  BUN 29* 26* 25* 22* 24*  CREATININE 1.66* 1.61* 1.36* 1.26* 1.36*  CALCIUM 8.4* 8.2* 8.2* 8.2* 8.4*  MG  --   --  2.1  --   --    GFR: Estimated Creatinine Clearance: 78.2 mL/min (A) (by C-G formula based on SCr of 1.36 mg/dL (H)). Liver Function Tests: Recent Labs  Lab 10/16/20 0417  AST 16  ALT 16  ALKPHOS 49  BILITOT 0.7  PROT 5.6*  ALBUMIN 2.8*   No results for input(s): LIPASE, AMYLASE in the last 168 hours. No results for input(s): AMMONIA in the last 168 hours. Coagulation Profile: No results for input(s): INR, PROTIME in the last 168 hours. Cardiac Enzymes: Recent Labs  Lab 10/17/20 0358  CKTOTAL 82   BNP (last 3 results) No results for  input(s): PROBNP in the last 8760 hours. HbA1C: No results for input(s): HGBA1C in the last 72 hours. CBG: No results for input(s): GLUCAP in the last 168 hours. Lipid Profile: No results for input(s): CHOL, HDL, LDLCALC, TRIG, CHOLHDL, LDLDIRECT in the last 72 hours. Thyroid Function Tests: No results for input(s): TSH, T4TOTAL, FREET4, T3FREE, THYROIDAB in the last 72 hours. Anemia Panel: No results for input(s): VITAMINB12, FOLATE, FERRITIN, TIBC, IRON, RETICCTPCT in the last 72 hours. Sepsis Labs: No results for input(s): PROCALCITON, LATICACIDVEN in the last 168 hours.  Recent Results (from the past 240 hour(s))  Resp Panel by RT-PCR (Flu A&B, Covid) Nasopharyngeal Swab     Status: None   Collection Time: 10/15/20 10:51 AM   Specimen: Nasopharyngeal Swab; Nasopharyngeal(NP) swabs in vial  transport medium  Result Value Ref Range Status   SARS Coronavirus 2 by RT PCR NEGATIVE NEGATIVE Final    Comment: (NOTE) SARS-CoV-2 target nucleic acids are NOT DETECTED.  The SARS-CoV-2 RNA is generally detectable in upper respiratory specimens during the acute phase of infection. The lowest concentration of SARS-CoV-2 viral copies this assay can detect is 138 copies/mL. A negative result does not preclude SARS-Cov-2 infection and should not be used as the sole basis for treatment or other patient management decisions. A negative result may occur with  improper specimen collection/handling, submission of specimen other than nasopharyngeal swab, presence of viral mutation(s) within the areas targeted by this assay, and inadequate number of viral copies(<138 copies/mL). A negative result must be combined with clinical observations, patient history, and epidemiological information. The expected result is Negative.  Fact Sheet for Patients:  BloggerCourse.com  Fact Sheet for Healthcare Providers:  SeriousBroker.it  This test is no t yet  approved or cleared by the [place] FDA and  has been authorized for detection and/or diagnosis of SARS-CoV-2 by FDA under an Emergency Use Authorization (EUA). This EUA will remain  in effect (meaning this test can be used) for the duration of the COVID-19 declaration under Section 564(b)(1) of the Act, 21 U.S.C.section 360bbb-3(b)(1), unless the authorization is terminated  or revoked sooner.       Influenza A by PCR NEGATIVE NEGATIVE Final   Influenza B by PCR NEGATIVE NEGATIVE Final    Comment: (NOTE) The Xpert Xpress SARS-CoV-2/FLU/RSV plus assay is intended as an aid in the diagnosis of influenza from Nasopharyngeal swab specimens and should not be used as a sole basis for treatment. Nasal washings and aspirates are unacceptable for Xpert Xpress SARS-CoV-2/FLU/RSV testing.  Fact Sheet for Patients: BloggerCourse.com  Fact Sheet for Healthcare Providers: SeriousBroker.it  This test is not yet approved or cleared by the [place] FDA and has been authorized for detection and/or diagnosis of SARS-CoV-2 by FDA under an Emergency Use Authorization (EUA). This EUA will remain in effect (meaning this test can be used) for the duration of the COVID-19 declaration under Section 564(b)(1) of the Act, 21 U.S.C. section 360bbb-3(b)(1), unless the authorization is terminated or revoked.  Performed at [facility], [address] [place], [place] [address]       Radiology Studies: CT HIP LEFT WO CONTRAST  Result Date: 10/18/2020 CLINICAL DATA:  Pain and swelling of the left hip, groin, and upper leg for 6 days. No known injury. EXAM: CT OF THE LOWER LEFT EXTREMITY WITHOUT CONTRAST TECHNIQUE: Multidetector CT imaging of the lower left extremity was performed according to the standard protocol. COMPARISON:  X-ray 10/17/2020, CT 05/07/2019 FINDINGS: Bones/Joint/Cartilage Left hip joint is intact without  fracture or dislocation. Mild hip joint space narrowing. Small subchondral cyst at the superior acetabulum. Marginal osteophytes of the femoral head. No evidence of femoral head avascular necrosis. Visualized portion of the left hemipelvis is intact without fracture. Possible small erosion along the inferior margin of the left sacroiliac joint along the sacral aspect (series 6, image 67). Left femur is intact without fracture or dislocation. No cortical thickening or periosteal elevation. No lytic or sclerotic bone lesion. Mild-moderate tricompartmental joint space narrowing of the left knee, most pronounced within the medial compartment with small marginal osteophytes. 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 No knee joint effusion. Ligaments Suboptimally assessed by CT. Muscles and Tendons Unremarkable muscle bulk without atrophy or fatty infiltration. No  evidence of tendinous abnormality by CT. Soft tissues Minimal subcutaneous edema at the anterior aspect of the thigh. No organized fluid collection. No deep fascial fluid or edema is seen. No inguinal lymphadenopathy. No soft tissue gas. No acute findings within the visualized portion of the pelvis. Mild scattered atherosclerotic calcifications are present. IMPRESSION: 1. No acute osseous abnormality of the left hip or femur. 2. Minimal subcutaneous edema at the anterior aspect of the left thigh, nonspecific. 3. 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 4. Possible small erosion along the inferior margin of the left sacroiliac joint, which may reflect sequela of sacroiliitis. Electronically Signed   By: Duanne Guess D.O.   On: 10/18/2020 16:14   CT FEMUR LEFT WO CONTRAST  Result Date: 10/18/2020 CLINICAL DATA:  Pain and swelling of the left hip, groin, and upper leg for 6 days. No known injury. EXAM: CT OF THE LOWER LEFT EXTREMITY WITHOUT CONTRAST TECHNIQUE:  Multidetector CT imaging of the lower left extremity was performed according to the standard protocol. COMPARISON:  X-ray 10/17/2020, CT 05/07/2019 FINDINGS: Bones/Joint/Cartilage Left hip joint is intact without fracture or dislocation. Mild hip joint space narrowing. Small subchondral cyst at the superior acetabulum. Marginal osteophytes of the femoral head. No evidence of femoral head avascular necrosis. Visualized portion of the left hemipelvis is intact without fracture. Possible small erosion along the inferior margin of the left sacroiliac joint along the sacral aspect (series 6, image 67). Left femur is intact without fracture or dislocation. No cortical thickening or periosteal elevation. No lytic or sclerotic bone lesion. Mild-moderate tricompartmental joint space narrowing of the left knee, most pronounced within the medial compartment with small marginal osteophytes. 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 No knee joint effusion. Ligaments Suboptimally assessed by CT. Muscles and Tendons Unremarkable muscle bulk without atrophy or fatty infiltration. No evidence of tendinous abnormality by CT. Soft tissues Minimal subcutaneous edema at the anterior aspect of the thigh. No organized fluid collection. No deep fascial fluid or edema is seen. No inguinal lymphadenopathy. No soft tissue gas. No acute findings within the visualized portion of the pelvis. Mild scattered atherosclerotic calcifications are present. IMPRESSION: 1. No acute osseous abnormality of the left hip or femur. 2. Minimal subcutaneous edema at the anterior aspect of the left thigh, nonspecific. 3. 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 4. Possible small erosion along the inferior margin of the left sacroiliac joint, which may reflect sequela of sacroiliitis. Electronically Signed   By: Duanne Guess D.O.   On: 10/18/2020 16:14   DG FEMUR 1V  LEFT  Result Date: 10/17/2020 CLINICAL DATA:  Left upper thigh/groin pain. EXAM: LEFT FEMUR 1 VIEW COMPARISON:  No prior. FINDINGS: Corticated tiny lucency in the lower femur most likely vascular marking. No acute bony abnormality. No evidence of fracture or dislocation. Degenerative changes left hip and knee. IMPRESSION: Degenerative changes left hip and knee. No acute abnormality. Electronically Signed   By: Maisie Fus  Register   On: 10/17/2020 15:37      Scheduled Meds: . aspirin EC  81 mg Oral Daily  . atorvastatin  40 mg Oral QHS  . carvedilol  3.125 mg Oral BID WC  . enoxaparin (LOVENOX) injection  60 mg Subcutaneous Q24H  . furosemide  40 mg Intravenous BID  . gabapentin  300 mg Oral QHS  . isosorbide-hydrALAZINE  1 tablet Oral TID  . pantoprazole  40 mg Oral Daily  . potassium chloride  40 mEq Oral Daily   Continuous Infusions:   LOS: 4 days      Time spent: 35 minutes   Noralee Stain, DO [facility] 10/19/2020, 12:16 PM   Available via Epic secure chat 7am-7pm After these hours, please refer to coverage provider listed on [email or web address]

## 2020-10-19 NOTE — Progress Notes (Signed)
Progress Note  Patient Name: Dale [name] Date of Encounter: 10/19/2020  [facility] Cardiologist: Kristeen Miss, MD   Subjective   No acute overnight events. Main complaint this morning is that he has not been able to sleep for the past 5 days. He does not know why he cannot sleep although he admits that withdrawal may be playing a role. He reports shortness of breath at rest is a little better but he still feels very short of breath with activity. No chest pain.   Inpatient Medications    Scheduled Meds: . aspirin EC  81 mg Oral Daily  . atorvastatin  40 mg Oral QHS  . carvedilol  3.125 mg Oral BID WC  . enoxaparin (LOVENOX) injection  60 mg Subcutaneous Q24H  . furosemide  40 mg Intravenous BID  . isosorbide-hydrALAZINE  1 tablet Oral TID  . pantoprazole  40 mg Oral Daily  . potassium chloride  40 mEq Oral Daily   Continuous Infusions:  PRN Meds: acetaminophen, ondansetron **OR** ondansetron (ZOFRAN) IV   Vital Signs    Vitals:   10/18/20 0629 10/18/20 1242 10/18/20 2009 10/19/20 0510  BP: (!) 149/102 (!) 135/98 (!) 128/99 (!) 136/94  Pulse: (!) 106 (!) 106 (!) 105 92  Resp: 20 18 20    Temp: 98.5 F (36.9 C) 97.7 F (36.5 C) 98.1 F (36.7 C) 97.9 F (36.6 C)  TempSrc: Oral Oral Oral Oral  SpO2: 96% 95% 97% 99%  Weight:      Height:        Intake/Output Summary (Last 24 hours) at 10/19/2020 0711 Last data filed at 10/18/2020 2010 Gross per 24 hour  Intake 720 ml  Output 2700 ml  Net -1980 ml   Last 3 Weights 10/18/2020 10/17/2020 10/15/2020  Weight (lbs) 259 lb 0.7 oz 259 lb 11.2 oz 263 lb 14.3 oz  Weight (kg) 117.5 kg 117.8 kg 119.7 kg      Telemetry    Sinus rhythm with rates in the 90's to low 100's. Short runs of non-sustained VT (longest run 4 beats). Occasional missed beat overnight. No recurrent 2nd degree AV block noted. - Personally Reviewed  ECG    No new ECG tracing today. - Personally Reviewed  Physical Exam   GEN: Obese  African-American male. No acute distress.   Neck: Supple. JVD possibly elevated. Cardiac: Borderline tachycardic with normal rhythm. No murmurs, rubs, or gallops.  Respiratory: Clear to auscultation bilaterally. No wheezes, rhonchi or rales. GI: Soft, non-distended, and non-tender. MS: Trace lower extremity edema. No deformity. Skin: Warm and dry. Neuro:  No focal deficits. Psych: Normal affect.  Labs    High Sensitivity Troponin:   Recent Labs  Lab 10/15/20 0700 10/15/20 0905  TROPONINIHS 30* 31*      Chemistry Recent Labs  Lab 10/16/20 0417 10/17/20 0358 10/18/20 0421 10/19/20 0418  NA 139 137 138 137  K 3.5 3.7 3.7 3.4*  CL 104 106 104 105  CO2 24 21* 23 23  GLUCOSE 119* 98 94 144*  BUN 26* 25* 22* 24*  CREATININE 1.61* 1.36* 1.26* 1.36*  CALCIUM 8.2* 8.2* 8.2* 8.4*  PROT 5.6*  --   --   --   ALBUMIN 2.8*  --   --   --   AST 16  --   --   --   ALT 16  --   --   --   ALKPHOS 49  --   --   --   BILITOT 0.7  --   --   --  GFRNONAA 50* >60 >60 >60  ANIONGAP 11 10 11 9      Hematology Recent Labs  Lab 10/15/20 0700 10/16/20 0417  WBC 7.4 6.5  RBC 4.47 4.36  HGB 12.5* 12.2*  HCT 38.9* 38.1*  MCV 87.0 87.4  MCH 28.0 28.0  MCHC 32.1 32.0  RDW 15.2 15.0  PLT 309 275    BNP Recent Labs  Lab 10/15/20 0700  BNP 3,415.1*     DDimer No results for input(s): DDIMER in the last 168 hours.   Radiology    CT HIP LEFT WO CONTRAST  Result Date: 10/18/2020 CLINICAL DATA:  Pain and swelling of the left hip, groin, and upper leg for 6 days. No known injury. EXAM: CT OF THE LOWER LEFT EXTREMITY WITHOUT CONTRAST TECHNIQUE: Multidetector CT imaging of the lower left extremity was performed according to the standard protocol. COMPARISON:  X-ray 10/17/2020, CT 05/07/2019 FINDINGS: Bones/Joint/Cartilage Left hip joint is intact without fracture or dislocation. Mild hip joint space narrowing. Small subchondral cyst at the superior acetabulum. Marginal osteophytes of  the femoral head. No evidence of femoral head avascular necrosis. Visualized portion of the left hemipelvis is intact without fracture. Possible small erosion along the inferior margin of the left sacroiliac joint along the sacral aspect (series 6, image 67). Left femur is intact without fracture or dislocation. No cortical thickening or periosteal elevation. No lytic or sclerotic bone lesion. Mild-moderate tricompartmental joint space narrowing of the left knee, most pronounced within the medial compartment with small marginal osteophytes. 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 No knee joint effusion. Ligaments Suboptimally assessed by CT. Muscles and Tendons Unremarkable muscle bulk without atrophy or fatty infiltration. No evidence of tendinous abnormality by CT. Soft tissues Minimal subcutaneous edema at the anterior aspect of the thigh. No organized fluid collection. No deep fascial fluid or edema is seen. No inguinal lymphadenopathy. No soft tissue gas. No acute findings within the visualized portion of the pelvis. Mild scattered atherosclerotic calcifications are present. IMPRESSION: 1. No acute osseous abnormality of the left hip or femur. 2. Minimal subcutaneous edema at the anterior aspect of the left thigh, nonspecific. 3. 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 4. Possible small erosion along the inferior margin of the left sacroiliac joint, which may reflect sequela of sacroiliitis. Electronically Signed   By: 07/08/2019 D.O.   On: 10/18/2020 16:14   CT FEMUR LEFT WO CONTRAST  Result Date: 10/18/2020 CLINICAL DATA:  Pain and swelling of the left hip, groin, and upper leg for 6 days. No known injury. EXAM: CT OF THE LOWER LEFT EXTREMITY WITHOUT CONTRAST TECHNIQUE: Multidetector CT imaging of the lower left extremity was performed according to the standard protocol. COMPARISON:  X-ray 10/17/2020, CT  05/07/2019 FINDINGS: Bones/Joint/Cartilage Left hip joint is intact without fracture or dislocation. Mild hip joint space narrowing. Small subchondral cyst at the superior acetabulum. Marginal osteophytes of the femoral head. No evidence of femoral head avascular necrosis. Visualized portion of the left hemipelvis is intact without fracture. Possible small erosion along the inferior margin of the left sacroiliac joint along the sacral aspect (series 6, image 67). Left femur is intact without fracture or dislocation. No cortical thickening or periosteal elevation. No lytic or sclerotic bone lesion. Mild-moderate tricompartmental joint space narrowing of the left knee, most pronounced within the medial compartment with small marginal osteophytes. 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 No knee joint effusion.  Ligaments Suboptimally assessed by CT. Muscles and Tendons Unremarkable muscle bulk without atrophy or fatty infiltration. No evidence of tendinous abnormality by CT. Soft tissues Minimal subcutaneous edema at the anterior aspect of the thigh. No organized fluid collection. No deep fascial fluid or edema is seen. No inguinal lymphadenopathy. No soft tissue gas. No acute findings within the visualized portion of the pelvis. Mild scattered atherosclerotic calcifications are present. IMPRESSION: 1. No acute osseous abnormality of the left hip or femur. 2. Minimal subcutaneous edema at the anterior aspect of the left thigh, nonspecific. 3. 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 4. Possible small erosion along the inferior margin of the left sacroiliac joint, which may reflect sequela of sacroiliitis. Electronically Signed   By: Duanne Guess D.O.   On: 10/18/2020 16:14   DG FEMUR 1V LEFT  Result Date: 10/17/2020 CLINICAL DATA:  Left upper thigh/groin pain. EXAM: LEFT FEMUR 1 VIEW COMPARISON:  No prior. FINDINGS:  Corticated tiny lucency in the lower femur most likely vascular marking. No acute bony abnormality. No evidence of fracture or dislocation. Degenerative changes left hip and knee. IMPRESSION: Degenerative changes left hip and knee. No acute abnormality. Electronically Signed   By: Maisie Fus  Register   On: 10/17/2020 15:37    Cardiac Studies   Echocardiogram 09/09/2020: Impressions: 1. Left ventricular ejection fraction, by estimation, is approximately  20%. The left ventricle has severely decreased function. The left  ventricle demonstrates global hypokinesis with regional variation as  noted. The left ventricular internal cavity  size was moderately dilated. Left ventricular diastolic parameters are  consistent with Grade III diastolic dysfunction (restrictive). No obvious  mural thrombus noted with Definity contrast.  2. Right ventricular systolic function is moderately to severely reduced.  The right ventricular size is normal. There is moderately elevated  pulmonary artery systolic pressure. The estimated right ventricular  systolic pressure is 52.0 mmHg.  3. Left atrial size was mild to moderately dilated.  4. Right atrial size was mild to moderately dilated.  5. The mitral valve is grossly normal. Mild mitral valve regurgitation.  6. The aortic valve is tricuspid. Aortic valve regurgitation is trivial.  7. The inferior vena cava is dilated in size with <50% respiratory  variability, suggesting right atrial pressure of 15 mmHg.  _______________  Lower Extremity Dopplers 10/15/2020: Summary:  RIGHT:  - No evidence of common femoral vein obstruction.    LEFT:  - There is no evidence of deep vein thrombosis in the lower extremity.  - No cystic structure found in the popliteal fossa.   Patient Profile     Mr. 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 Caleb Popp.   Assessment & Plan    Acute on Chronic Combined CHF  - BNP elevated at 3,415.  - Chest x-ray showed cardiomegaly with central pulmonary vascular congestion and hazy bibasilar opacities.  - Echo last month during another admission for CHF showed LVEF of 20% with global hypokinesis and grade 3 diastolic dysfunction. RV systolic function severely reduced and PASP moderately elevated.  - Lasix increased to 40mg  twice daily yesterday. Documented urinary output of 2.7 L in the past 24 hours and net negative 8 L since admission. No  new weight today but weight down 4 lbs from admission yesterday. Creatinine up slightly from yesterday.  - Will continue current dose of Lasix. If creatinine continues to rise, may need to back off. - Will hold off on starting ACEi/ARB while we increase diuresis. - Coreg 3.125mg  twice daily started ysterday. - Continue Bidil 1 tablet three times daily. - Continue daily weight, strict I/O's, and renal function.  - Unfortunately, patient's lifestyle choices are not conducive to healthy heart. Importance of alcohol and cocaine use has been discussed. Patient is not a candidate for ICD or advanced heart failure therapy given ongoing substance abuse. Social work has been consulted to assist with stable living and medication situation.   CAD  Demand Ischemic  - R/LHC in 05/2019 showed single vessel CAD with CTO of RCA with collaterals.  - High-sensitivity troponin minimally elevated and flat at 30 >> 31. Not consistent with ACS. Likely demand ischemia in setting of CHF.  - No chest pain.  - Continue aspirin, beta-blocker, and high-intensity statin.  2nd Degree AV Block - Patient had transient 2nd degree AV block (looked like both type I and type II) overnight on 12/15. No recurrence last night but did  have occasional missed beats. - Patient reports snoring and given body habitus, suspect he may have sleep apnea. - Will continue to monitor on telemetry.   Non-Sustained VT - Short run of non-sustained VT. - Potassium 3.4 today. Patient on K-Dur 40 mEq daily. Extra dose of 20 mEq has been ordered by primary team. - Magnesium 2.1 on 12/14. - Continue beta-blocker as above. - Will continue to monitor for prolonged runs of NSVT.  Hypertension  - BP mildly elevated. - Continue Bidil and Coreg as above. - Hopefully will continue to improve with diuresis.   Hyperlipidemia  - Lipid panel from 09/2020: Total Cholesterol 164, Triglycerides 38, HDL 45, LDL 111.  - LDL goal <70 given CAD.  - Continue Lipitor.   AKI - Creatinine 1.66 on admission. Baseline around 1.1.  - Creatinine 1.36 today up slightly from 1.26 yesterday after increase in diuresis.  - Continue to monitor.  Leg Pain - CT unremarkable.  - Will give Gabapentin 300mg  at night to see if that helps.   Polysubstance Abuse  - Patient reports tobacco, alcohol, and cocaine use and admits that it will be extremely hard for him to staff off drugs in his current living situation.  - Importance of complete cessation has been discussed.  - Social work has been consulted.   For questions or updates, please contact [facility] Please consult [email or web address] for contact info under        Signed, , PA-C  10/19/2020, 7:11 AM

## 2020-10-19 NOTE — Progress Notes (Signed)
Physical Therapy Treatment Patient Details Name: Dale [name] MRN: [id number] DOB: 11/20/1960 Today's Date: 10/19/2020    History of Present Illness 59 yo male admitted with LE edema, HF. Hx of drug abuse, HF, CAD, ETOH abuse    PT Comments    Patient making gradual progress with acute PT and initiated gait training with SPC today. Pt reported incident where his Lt LE buckled on the stairs ~ 1 week ago and reports his Lt hip started hurting after that. Pain at Lt hip/groin seems provoked by rectus femoris activation with quad set, SLR, and with muscle palpation. Encouraged pt to trial ice or heat for pain in thigh and to use SPC in Rt UE to reduce pressure on Lt hip. Acute PT will continue to progress mobility as able. If pt declines placement at SNF he will benefit from follow up HHPT services and home aides if available.   Follow Up Recommendations  SNF;Home health PT (pt may decline SNF placement; would benefit from HHPT and home aides)     Equipment Recommendations  None recommended by PT    Recommendations for Other Services       Precautions / Restrictions Precautions Precautions: Fall Restrictions Weight Bearing Restrictions: No    Mobility  Bed Mobility Overal bed mobility: Modified Independent             General bed mobility comments: pt takes extra time due to Lt hip pain, HOB slightly elevated.  Transfers Overall transfer level: Needs assistance Equipment used: None;Straight cane Transfers: Sit to/from [employer] Sit to Stand: Supervision Stand pivot transfers: Supervision;Min guard       General transfer comment: no assist required for rise and pt reaches back to sit without cues.  Ambulation/Gait Ambulation/Gait assistance: Min assist;Min guard Gait Distance (Feet): 120 Feet Assistive device: Straight cane;None Gait Pattern/deviations: Step-to pattern;Antalgic;Decreased stride length;Decreased weight shift to left Gait velocity:  decr   General Gait Details: pt unsteady with antalgic gait with no device to take several steps in room. SPC provided and pt educated to use in Rt UE for Lt LE pain; antalgia improved and pt able to ambulate in hallway.   Stairs             Wheelchair Mobility    Modified Rankin (Stroke Patients Only)       Balance Overall balance assessment: Needs assistance Sitting-balance support: Feet supported Sitting balance-Leahy Scale: Good     Standing balance support: During functional activity;Single extremity supported;No upper extremity supported Standing balance-Leahy Scale: Fair Standing balance comment: pt able to stand to use urinal and wash hands at sink with no LOB.                            Cognition Arousal/Alertness: Awake/alert Behavior During Therapy: WFL for tasks assessed/performed Overall Cognitive Status: Within Functional Limits for tasks assessed                                        Exercises      General Comments        Pertinent Vitals/Pain Pain Assessment: 0-10 Pain Location: Lt groin,thigh Pain Descriptors / Indicators: Grimacing;Sharp Pain Intervention(s): Limited activity within patient's tolerance;Monitored during session;Repositioned    Home Living  Prior Function            PT Goals (current goals can now be found in the care plan section) Acute Rehab PT Goals PT Goal Formulation: With patient Time For Goal Achievement: 10/30/20 Potential to Achieve Goals: Good Progress towards PT goals: Progressing toward goals    Frequency    Min 3X/week      PT Plan Current plan remains appropriate;Frequency needs to be updated    Co-evaluation              AM-PAC PT "6 Clicks" Mobility   Outcome Measure  Help needed turning from your back to your side while in a flat bed without using bedrails?: A Little Help needed moving from lying on your back to sitting on the  side of a flat bed without using bedrails?: A Little Help needed moving to and from a bed to a chair (including a wheelchair)?: A Little Help needed standing up from a chair using your arms (e.g., wheelchair or bedside chair)?: A Little Help needed to walk in hospital room?: A Little Help needed climbing 3-5 steps with a railing? : A Lot 6 Click Score: 17    End of Session Equipment Utilized During Treatment: Gait belt Activity Tolerance: Patient tolerated treatment well Patient left: in chair;with call bell/phone within reach;with chair alarm set Nurse Communication: Mobility status PT Visit Diagnosis: Pain;Unsteadiness on feet (R26.81);Difficulty in walking, not elsewhere classified (R26.2) Pain - Right/Left: Left Pain - part of body: Hip     Time: 5329-9242 PT Time Calculation (min) (ACUTE ONLY): 33 min  Charges:  $Gait Training: 8-22 mins $Therapeutic Activity: 8-22 mins                     Wynn Maudlin, DPT Acute Rehabilitation Services Office [phone] Pager [phone]

## 2020-10-19 NOTE — TOC Progression Note (Signed)
Transition of Care [facility]) - Progression Note    Patient Details  Name: Dale [name] MRN: [id number] Date of Birth: 1961/09/29  [facility]) CM/SW Contact  Darleene Cleaver, [place] Phone Number: 10/19/2020, 5:17 PM  Clinical Narrative:    CSW was informed that patient needs to talk with CSW.  CSW spoke to patient, and he stated that he does not want to go back to the extended stay where he was living before the hospitalization.  CSW informed him there is not much that this CSW can do, CSW can provide resources for the homeless shelter.  Per patient he stated he talked to Victory Dakin from [employer], and she had found him a place but then he was admitted to the hospital.  CSW was asked to follow up with her at [phone] and to ask if they can still help him.  CSW to follow up at a later time.     Barriers to Discharge: No Barriers Identified  Expected Discharge Plan and Services                                                 Social Determinants of Health (SDOH) Interventions    Readmission Risk Interventions Readmission Risk Prevention Plan 09/11/2020 03/03/2019  Transportation Screening Complete Complete  PCP or Specialist Appt within 3-5 Days Complete -  HRI or Home Care Consult Complete -  Social Work Consult for Recovery Care Planning/Counseling Complete -  Palliative Care Screening Complete -  Medication Review [employer]) Complete Complete  PCP or Specialist appointment within 3-5 days of discharge - Complete  HRI or Home Care Consult - (No Data)  SW Recovery Care/Counseling Consult - Complete  Palliative Care Screening - Not Applicable  Skilled Nursing Facility - Not Applicable  Some recent data might be hidden

## 2020-10-19 NOTE — Progress Notes (Signed)
PIV consult: Pt requested placement in R hand. Declined recommendation to switch to L arm after phlebitis in R AC. Pt instructed to alert RN to any pain or increased swelling in arm. Voiced understanding.

## 2020-10-20 LAB — BASIC METABOLIC PANEL
Anion gap: 12 (ref 5–15)
BUN: 26 mg/dL — ABNORMAL HIGH (ref 6–20)
CO2: 22 mmol/L (ref 22–32)
Calcium: 8.5 mg/dL — ABNORMAL LOW (ref 8.9–10.3)
Chloride: 105 mmol/L (ref 98–111)
Creatinine, Ser: 1.44 mg/dL — ABNORMAL HIGH (ref 0.61–1.24)
GFR, Estimated: 57 mL/min — ABNORMAL LOW (ref >60)
Glucose, Bld: 87 mg/dL (ref 70–99)
Potassium: 3.9 mmol/L (ref 3.5–5.1)
Sodium: 139 mmol/L (ref 135–145)

## 2020-10-20 MED ORDER — CARVEDILOL 6.25 MG PO TABS
6.2500 mg | ORAL_TABLET | Freq: Two times a day (BID) | ORAL | Status: DC
  Administered 2020-10-20 – 2020-10-21 (×4): 6.25 mg via ORAL
  Filled 2020-10-20 (×4): qty 1

## 2020-10-20 MED ORDER — TORSEMIDE 20 MG PO TABS
40.0000 mg | ORAL_TABLET | Freq: Every day | ORAL | Status: DC
  Administered 2020-10-21: 40 mg via ORAL
  Filled 2020-10-20: qty 2

## 2020-10-20 NOTE — Progress Notes (Signed)
Physical Therapy Treatment and Discharge Patient Details Name: Dale [name] MRN: [id number] DOB: 25-Aug-1961 Today's Date: 10/20/2020    History of Present Illness 59 yo male admitted with LE edema, HF. Hx of drug abuse, HF, CAD, ETOH abuse    PT Comments    Pt ambulated in room modified independent and then in hallway with SPC.  Pt with antalgic gait reporting left groin pain however no unsteadiness or LOB.  Pt states he has a "spot" on left groin worries about spider bite.  Pt does have a small area mid left groin of disrupted skin (possibly ingrown hair?) however not red, but does have induration.  RN was notified.  Pt modified independent in room and supervision in hallway meeting his acute PT goals.  PT to sign off at this time.  PT would benefit from ambulating with nursing during remainder of hospitalization.   Follow Up Recommendations  Home health PT     Equipment Recommendations  None recommended by PT    Recommendations for Other Services       Precautions / Restrictions Precautions Precautions: Fall    Mobility  Bed Mobility Overal bed mobility: Modified Independent                Transfers Overall transfer level: Modified independent                  Ambulation/Gait Ambulation/Gait assistance: Supervision Gait Distance (Feet): 120 Feet Assistive device: Straight cane;None Gait Pattern/deviations: Step-to pattern;Antalgic;Decreased stance time - left     General Gait Details: pt with "limping" while ambulating in room without cane which improved slightly with use of SPC in hallway, pt continues to report left groin pain and states "popping" with pain now occurring.  pt did not require any assistance with ambulating, supervision for safety   Stairs             Wheelchair Mobility    Modified Rankin (Stroke Patients Only)       Balance                                            Cognition Arousal/Alertness:  Awake/alert Behavior During Therapy: WFL for tasks assessed/performed Overall Cognitive Status: Within Functional Limits for tasks assessed                                        Exercises      General Comments        Pertinent Vitals/Pain Pain Assessment: Faces Faces Pain Scale: Hurts little more Pain Location: Lt groin Pain Descriptors / Indicators: Grimacing;Other (Comment) ("pop") Pain Intervention(s): Repositioned;Monitored during session (RN aware)    Home Living                      Prior Function            PT Goals (current goals can now be found in the care plan section) Progress towards PT goals: Goals met/education completed, patient discharged from PT    Frequency    Min 3X/week      PT Plan Current plan remains appropriate    Co-evaluation              AM-PAC PT "6 Clicks" Mobility   Outcome Measure  Help  needed turning from your back to your side while in a flat bed without using bedrails?: A Little Help needed moving from lying on your back to sitting on the side of a flat bed without using bedrails?: A Little Help needed moving to and from a bed to a chair (including a wheelchair)?: A Little Help needed standing up from a chair using your arms (e.g., wheelchair or bedside chair)?: A Little Help needed to walk in hospital room?: A Little Help needed climbing 3-5 steps with a railing? : A Little 6 Click Score: 18    End of Session   Activity Tolerance: Patient tolerated treatment well Patient left: with call bell/phone within reach;in bed   PT Visit Diagnosis: Other abnormalities of gait and mobility (R26.89)     Time: 3017-2091 PT Time Calculation (min) (ACUTE ONLY): 12 min  Charges:  $Gait Training: 8-22 mins                     Arlyce Dice, DPT Acute Rehabilitation Services Pager: [phone] Office: [phone]  York Ram E 10/20/2020, 3:05 PM

## 2020-10-20 NOTE — Progress Notes (Signed)
PROGRESS NOTE    Dale [name]  ZOX:[id number] DOB: [birth year] DOA: 10/15/2020 PCP: Grayce Sessions, NP     Brief Narrative:  Dale [name] is a 59 y.o. male with medical history significant of cocaine abuse, chronic combined CHF, CAD. He presents with 3 days of edema in BLE. He says he has not changed his diet or done anything different in the last three days. He tried to take his torsemide to help, but it doesn't change anything. He tried elevating his legs, but this only gives temporary relief. He reports cough w/ some mucus production over the same time. Tried delsym and robitussin, but only partial relief. Over the last day or so he reports groin pain w/ walking. He has tried no intervention for that. Work up revealed elevated BNP.  Patient was admitted due to CHF exacerbation.  Cardiology was consulted.  New events last 24 hours / Subjective: States that he slept better last night, continues to complain of left leg pain.  Urine output 1.3 L last 24 hours.  Assessment & Plan:   Principal Problem:   Acute on chronic combined systolic and diastolic HF (heart failure) (HCC) Active Problems:   Essential hypertension   Cocaine abuse (HCC)   Hyperlipidemia   AKI (acute kidney injury) (HCC)    Acute on chronic combined systolic and diastolic heart failure -Last EF 20% with grade III diastolic dysfunction. Elevated BNP 3415.1 on admission. Mildly elevated but flat troponin. Started on Lasix IV. Patient with a history of on-going cocaine abuse. He is on Coreg, spironolactone and torsemide -Continue Lasix IV, Coreg -Strict I's and O's, daily weight -Cardiology following  Left thigh pain -Venous duplex negative for DVT -CK 82 -X-ray revealed degenerative changes in left hip, knee without acute abnormality -CT left hip and femur without acute osseous abnormality.  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 Creatinine of 1.66 on admission and trending down with diuresis -Renal ultrasound unremarkable -Creatinine improved and has plateaued at 1.2-1.4. Will monitor  CAD -Continue aspirin, Lipitor  Essential hypertension -Continue Coreg and Bidil  Demand ischemia -Troponin trend is flat  Concern for OSA -Recommend outpatient sleep study  Polysubstance abuse -Reported alcohol, tobacco, cocaine use   DVT prophylaxis: Lovenox   Code Status: Full Family Communication: No family at bedside Disposition Plan:  Status is: Inpatient  Remains inpatient appropriate because:IV treatments appropriate due to intensity of illness or inability to take PO and Inpatient level of care appropriate due to severity of illness   Dispo: The patient is from: Home              Anticipated d/c is to: Home.  Does not qualify for SNF due to Medicaid status              Anticipated d/c date is: 1 day              Patient currently is not medically stable to d/c.  Continue diuresis per cardiology.     Consultants:   Cardiology  Procedures:   None  Antimicrobials:  Anti-infectives (From admission, onward)   None       Objective: Vitals:   10/19/20 0510 10/19/20 1239 10/19/20 2144 10/20/20 0609  BP: (!) 136/94 (!) 129/99 Marland Kitchen)  125/97 (!) 129/93  Pulse: 92 (!) 101 93 97  Resp:  18  20  Temp: 97.9 F (36.6 C) 97.9 F (36.6 C) 97.9 F (36.6 C) 98.4 F (36.9 C)  TempSrc: Oral Oral Oral Oral  SpO2: 99% 97% 94% 97%  Weight:    113 kg  Height:        Intake/Output Summary (Last 24 hours) at 10/20/2020 1041 Last data filed at 10/19/2020 1200 Gross per 24 hour  Intake -  Output 1300 ml  Net -1300 ml   Filed Weights   10/17/20 0432 10/18/20 0500 10/20/20 0609  Weight: 117.8 kg  117.5 kg 113 kg    Examination: General exam: Appears calm and comfortable  Respiratory system: Clear to auscultation. Respiratory effort normal.  On room air Cardiovascular system: S1 & S2 heard, RRR. + Bilateral mild pedal edema. Gastrointestinal system: Abdomen is nondistended, soft and nontender. Normal bowel sounds heard. Central nervous system: Alert and oriented. Non focal exam. Speech clear  Extremities: Symmetric in appearance bilaterally  Skin: No rashes, lesions or ulcers on exposed skin  Psychiatry: Judgement and insight appear stable. Mood & affect appropriate.    Data Reviewed: I have personally reviewed following labs and imaging studies  CBC: Recent Labs  Lab 10/15/20 0700 10/16/20 0417  WBC 7.4 6.5  NEUTROABS 5.3  --   HGB 12.5* 12.2*  HCT 38.9* 38.1*  MCV 87.0 87.4  PLT 309 275   Basic Metabolic Panel: Recent Labs  Lab 10/16/20 0417 10/17/20 0358 10/18/20 0421 10/19/20 0418 10/20/20 0427  NA 139 137 138 137 139  K 3.5 3.7 3.7 3.4* 3.9  CL 104 106 104 105 105  CO2 24 21* 23 23 22   GLUCOSE 119* 98 94 144* 87  BUN 26* 25* 22* 24* 26*  CREATININE 1.61* 1.36* 1.26* 1.36* 1.44*  CALCIUM 8.2* 8.2* 8.2* 8.4* 8.5*  MG  --  2.1  --   --   --    GFR: Estimated Creatinine Clearance: 72.4 mL/min (A) (by C-G formula based on SCr of 1.44 mg/dL (H)). Liver Function Tests: Recent Labs  Lab 10/16/20 0417  AST 16  ALT 16  ALKPHOS 49  BILITOT 0.7  PROT 5.6*  ALBUMIN 2.8*   No results for input(s): LIPASE, AMYLASE in the last 168 hours. No results for input(s): AMMONIA in the last 168 hours. Coagulation Profile: No results for input(s): INR, PROTIME in the last 168 hours. Cardiac Enzymes: Recent Labs  Lab 10/17/20 0358  CKTOTAL 82   BNP (last 3 results) No results for input(s): PROBNP in the last 8760 hours. HbA1C: No results for input(s): HGBA1C in the last 72 hours. CBG: No results for input(s): GLUCAP in the last 168 hours. Lipid Profile: No  results for input(s): CHOL, HDL, LDLCALC, TRIG, CHOLHDL, LDLDIRECT in the last 72 hours. Thyroid Function Tests: No results for input(s): TSH, T4TOTAL, FREET4, T3FREE, THYROIDAB in the last 72 hours. Anemia Panel: No results for input(s): VITAMINB12, FOLATE, FERRITIN, TIBC, IRON, RETICCTPCT in the last 72 hours. Sepsis Labs: No results for input(s): PROCALCITON, LATICACIDVEN in the last 168 hours.  Recent Results (from the past 240 hour(s))  Resp Panel by RT-PCR (Flu A&B, Covid) Nasopharyngeal Swab     Status: None   Collection Time: 10/15/20 10:51 AM   Specimen: Nasopharyngeal Swab; Nasopharyngeal(NP) swabs in vial transport medium  Result Value Ref Range Status   SARS Coronavirus 2 by RT PCR NEGATIVE NEGATIVE Final    Comment: (NOTE) SARS-CoV-2 target nucleic  acids are NOT DETECTED.  The SARS-CoV-2 RNA is generally detectable in upper respiratory specimens during the acute phase of infection. The lowest concentration of SARS-CoV-2 viral copies this assay can detect is 138 copies/mL. A negative result does not preclude SARS-Cov-2 infection and should not be used as the sole basis for treatment or other patient management decisions. A negative result may occur with  improper specimen collection/handling, submission of specimen other than nasopharyngeal swab, presence of viral mutation(s) within the areas targeted by this assay, and inadequate number of viral copies(<138 copies/mL). A negative result must be combined with clinical observations, patient history, and epidemiological information. The expected result is Negative.  Fact Sheet for Patients:  BloggerCourse.com  Fact Sheet for Healthcare Providers:  SeriousBroker.it  This test is no t yet approved or cleared by the [place] FDA and  has been authorized for detection and/or diagnosis of SARS-CoV-2 by FDA under an Emergency Use Authorization (EUA). This EUA will remain   in effect (meaning this test can be used) for the duration of the COVID-19 declaration under Section 564(b)(1) of the Act, 21 U.S.C.section 360bbb-3(b)(1), unless the authorization is terminated  or revoked sooner.       Influenza A by PCR NEGATIVE NEGATIVE Final   Influenza B by PCR NEGATIVE NEGATIVE Final    Comment: (NOTE) The Xpert Xpress SARS-CoV-2/FLU/RSV plus assay is intended as an aid in the diagnosis of influenza from Nasopharyngeal swab specimens and should not be used as a sole basis for treatment. Nasal washings and aspirates are unacceptable for Xpert Xpress SARS-CoV-2/FLU/RSV testing.  Fact Sheet for Patients: BloggerCourse.com  Fact Sheet for Healthcare Providers: SeriousBroker.it  This test is not yet approved or cleared by the [place] FDA and has been authorized for detection and/or diagnosis of SARS-CoV-2 by FDA under an Emergency Use Authorization (EUA). This EUA will remain in effect (meaning this test can be used) for the duration of the COVID-19 declaration under Section 564(b)(1) of the Act, 21 U.S.C. section 360bbb-3(b)(1), unless the authorization is terminated or revoked.  Performed at [facility], [address], [place], [place] [address]       Radiology Studies: CT HIP LEFT WO CONTRAST  Result Date: 10/18/2020 CLINICAL DATA:  Pain and swelling of the left hip, groin, and upper leg for 6 days. No known injury. EXAM: CT OF THE LOWER LEFT EXTREMITY WITHOUT CONTRAST TECHNIQUE: Multidetector CT imaging of the lower left extremity was performed according to the standard protocol. COMPARISON:  X-ray 10/17/2020, CT 05/07/2019 FINDINGS: Bones/Joint/Cartilage Left hip joint is intact without fracture or dislocation. Mild hip joint space narrowing. Small subchondral cyst at the superior acetabulum. Marginal osteophytes of the femoral head. No evidence of femoral head avascular  necrosis. Visualized portion of the left hemipelvis is intact without fracture. Possible small erosion along the inferior margin of the left sacroiliac joint along the sacral aspect (series 6, image 67). Left femur is intact without fracture or dislocation. No cortical thickening or periosteal elevation. No lytic or sclerotic bone lesion. Mild-moderate tricompartmental joint space narrowing of the left knee, most pronounced within the medial compartment with small marginal osteophytes. 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 No knee joint effusion. Ligaments Suboptimally assessed by CT. Muscles and Tendons Unremarkable muscle bulk without atrophy or fatty infiltration. No evidence of tendinous abnormality by CT. Soft tissues Minimal subcutaneous edema at the anterior aspect of the thigh. No organized fluid collection. No deep fascial fluid or  edema is seen. No inguinal lymphadenopathy. No soft tissue gas. No acute findings within the visualized portion of the pelvis. Mild scattered atherosclerotic calcifications are present. IMPRESSION: 1. No acute osseous abnormality of the left hip or femur. 2. Minimal subcutaneous edema at the anterior aspect of the left thigh, nonspecific. 3. 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 4. Possible small erosion along the inferior margin of the left sacroiliac joint, which may reflect sequela of sacroiliitis. Electronically Signed   By: Duanne Guess D.O.   On: 10/18/2020 16:14   CT FEMUR LEFT WO CONTRAST  Result Date: 10/18/2020 CLINICAL DATA:  Pain and swelling of the left hip, groin, and upper leg for 6 days. No known injury. EXAM: CT OF THE LOWER LEFT EXTREMITY WITHOUT CONTRAST TECHNIQUE: Multidetector CT imaging of the lower left extremity was performed according to the standard protocol. COMPARISON:  X-ray 10/17/2020, CT 05/07/2019 FINDINGS: Bones/Joint/Cartilage Left hip joint is  intact without fracture or dislocation. Mild hip joint space narrowing. Small subchondral cyst at the superior acetabulum. Marginal osteophytes of the femoral head. No evidence of femoral head avascular necrosis. Visualized portion of the left hemipelvis is intact without fracture. Possible small erosion along the inferior margin of the left sacroiliac joint along the sacral aspect (series 6, image 67). Left femur is intact without fracture or dislocation. No cortical thickening or periosteal elevation. No lytic or sclerotic bone lesion. Mild-moderate tricompartmental joint space narrowing of the left knee, most pronounced within the medial compartment with small marginal osteophytes. 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 No knee joint effusion. Ligaments Suboptimally assessed by CT. Muscles and Tendons Unremarkable muscle bulk without atrophy or fatty infiltration. No evidence of tendinous abnormality by CT. Soft tissues Minimal subcutaneous edema at the anterior aspect of the thigh. No organized fluid collection. No deep fascial fluid or edema is seen. No inguinal lymphadenopathy. No soft tissue gas. No acute findings within the visualized portion of the pelvis. Mild scattered atherosclerotic calcifications are present. IMPRESSION: 1. No acute osseous abnormality of the left hip or femur. 2. Minimal subcutaneous edema at the anterior aspect of the left thigh, nonspecific. 3. 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 4. Possible small erosion along the inferior margin of the left sacroiliac joint, which may reflect sequela of sacroiliitis. Electronically Signed   By: Duanne Guess D.O.   On: 10/18/2020 16:14      Scheduled Meds: . aspirin EC  81 mg Oral Daily  . atorvastatin  40 mg Oral QHS  . carvedilol  6.25 mg Oral BID WC  . enoxaparin (LOVENOX) injection  60 mg Subcutaneous Q24H  . furosemide  40 mg  Intravenous BID  . gabapentin  300 mg Oral QHS  . isosorbide-hydrALAZINE  1 tablet Oral TID  . pantoprazole  40 mg Oral Daily  . potassium chloride  40 mEq Oral Daily   Continuous Infusions:   LOS: 5 days      Time spent:   Noralee Stain, DO [facility] 10/20/2020, 10:41 AM   Available via Epic secure chat 7am-7pm After these hours, please refer to coverage provider listed on [email or web address]

## 2020-10-20 NOTE — Progress Notes (Signed)
Progress Note  Patient Name: Dale [name] Date of Encounter: 10/20/2020  [facility] Cardiologist: Kristeen Miss, MD   Subjective   No acute overnight events. Patient states he slept much better last night with the Gabapentin but he is still having having left upper thigh pain this morning. This is his biggest complaints. He looks like he is breathing much better this morning and he does report his shortness of breath has improved. No chest pain. He notes occasional palpitations. No lightheadedness or dizziness.   Inpatient Medications    Scheduled Meds: . aspirin EC  81 mg Oral Daily  . atorvastatin  40 mg Oral QHS  . carvedilol  3.125 mg Oral BID WC  . enoxaparin (LOVENOX) injection  60 mg Subcutaneous Q24H  . furosemide  40 mg Intravenous BID  . gabapentin  300 mg Oral QHS  . isosorbide-hydrALAZINE  1 tablet Oral TID  . pantoprazole  40 mg Oral Daily  . potassium chloride  40 mEq Oral Daily   Continuous Infusions:  PRN Meds: acetaminophen, ondansetron **OR** ondansetron (ZOFRAN) IV   Vital Signs    Vitals:   10/19/20 0510 10/19/20 1239 10/19/20 2144 10/20/20 0609  BP: (!) 136/94 (!) 129/99 (!) 125/97 (!) 129/93  Pulse: 92 (!) 101 93 97  Resp:  18  20  Temp: 97.9 F (36.6 C) 97.9 F (36.6 C) 97.9 F (36.6 C) 98.4 F (36.9 C)  TempSrc: Oral Oral Oral Oral  SpO2: 99% 97% 94% 97%  Weight:    113 kg  Height:        Intake/Output Summary (Last 24 hours) at 10/20/2020 5621 Last data filed at 10/19/2020 1200 Gross per 24 hour  Intake --  Output 1300 ml  Net -1300 ml   Last 3 Weights 10/20/2020 10/18/2020 10/17/2020  Weight (lbs) 249 lb 1.6 oz 259 lb 0.7 oz 259 lb 11.2 oz  Weight (kg) 112.991 kg 117.5 kg 117.8 kg      Telemetry    Sinus rhythm with rates in the 90's to low 100's. One short run of non-sustained VT noted (7 beats).  - Personally Reviewed  ECG    No new ECG tracing today. - Personally Reviewed  Physical Exam   GEN: No acute  distress.   Neck: Supple. No JVD Cardiac: Borderline tachycardic with regular rhythm. No murmurs, rubs, or gallops.  Respiratory: Clear to auscultation bilaterally. No wheezes, rhonchi, or rales.  GI: Soft, non-distended, and non-tender. MS: Trace lower extremity edema. No deformity. Skin: Warm and dry. Neuro:  No focal deficits. Psych: Normal affect.   Labs    High Sensitivity Troponin:   Recent Labs  Lab 10/15/20 0700 10/15/20 0905  TROPONINIHS 30* 31*      Chemistry Recent Labs  Lab 10/16/20 0417 10/17/20 0358 10/18/20 0421 10/19/20 0418 10/20/20 0427  NA 139   < > 138 137 139  K 3.5   < > 3.7 3.4* 3.9  CL 104   < > 104 105 105  CO2 24   < > 23 23 22   GLUCOSE 119*   < > 94 144* 87  BUN 26*   < > 22* 24* 26*  CREATININE 1.61*   < > 1.26* 1.36* 1.44*  CALCIUM 8.2*   < > 8.2* 8.4* 8.5*  PROT 5.6*  --   --   --   --   ALBUMIN 2.8*  --   --   --   --   AST 16  --   --   --   --  ALT 16  --   --   --   --   ALKPHOS 49  --   --   --   --   BILITOT 0.7  --   --   --   --   GFRNONAA 50*   < > >60 >60 57*  ANIONGAP 11   < > 11 9 12    < > = values in this interval not displayed.     Hematology Recent Labs  Lab 10/15/20 0700 10/16/20 0417  WBC 7.4 6.5  RBC 4.47 4.36  HGB 12.5* 12.2*  HCT 38.9* 38.1*  MCV 87.0 87.4  MCH 28.0 28.0  MCHC 32.1 32.0  RDW 15.2 15.0  PLT 309 275    BNP Recent Labs  Lab 10/15/20 0700  BNP 3,415.1*     DDimer No results for input(s): DDIMER in the last 168 hours.   Radiology    CT HIP LEFT WO CONTRAST  Result Date: 10/18/2020 CLINICAL DATA:  Pain and swelling of the left hip, groin, and upper leg for 6 days. No known injury. EXAM: CT OF THE LOWER LEFT EXTREMITY WITHOUT CONTRAST TECHNIQUE: Multidetector CT imaging of the lower left extremity was performed according to the standard protocol. COMPARISON:  X-ray 10/17/2020, CT 05/07/2019 FINDINGS: Bones/Joint/Cartilage Left hip joint is intact without fracture or dislocation.  Mild hip joint space narrowing. Small subchondral cyst at the superior acetabulum. Marginal osteophytes of the femoral head. No evidence of femoral head avascular necrosis. Visualized portion of the left hemipelvis is intact without fracture. Possible small erosion along the inferior margin of the left sacroiliac joint along the sacral aspect (series 6, image 67). Left femur is intact without fracture or dislocation. No cortical thickening or periosteal elevation. No lytic or sclerotic bone lesion. Mild-moderate tricompartmental joint space narrowing of the left knee, most pronounced within the medial compartment with small marginal osteophytes. 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 No knee joint effusion. Ligaments Suboptimally assessed by CT. Muscles and Tendons Unremarkable muscle bulk without atrophy or fatty infiltration. No evidence of tendinous abnormality by CT. Soft tissues Minimal subcutaneous edema at the anterior aspect of the thigh. No organized fluid collection. No deep fascial fluid or edema is seen. No inguinal lymphadenopathy. No soft tissue gas. No acute findings within the visualized portion of the pelvis. Mild scattered atherosclerotic calcifications are present. IMPRESSION: 1. No acute osseous abnormality of the left hip or femur. 2. Minimal subcutaneous edema at the anterior aspect of the left thigh, nonspecific. 3. 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 4. Possible small erosion along the inferior margin of the left sacroiliac joint, which may reflect sequela of sacroiliitis. Electronically Signed   By: 07/08/2019 D.O.   On: 10/18/2020 16:14   CT FEMUR LEFT WO CONTRAST  Result Date: 10/18/2020 CLINICAL DATA:  Pain and swelling of the left hip, groin, and upper leg for 6 days. No known injury. EXAM: CT OF THE LOWER LEFT EXTREMITY WITHOUT CONTRAST TECHNIQUE: Multidetector CT imaging of the  lower left extremity was performed according to the standard protocol. COMPARISON:  X-ray 10/17/2020, CT 05/07/2019 FINDINGS: Bones/Joint/Cartilage Left hip joint is intact without fracture or dislocation. Mild hip joint space narrowing. Small subchondral cyst at the superior acetabulum. Marginal osteophytes of the femoral head. No evidence of femoral head avascular necrosis. Visualized portion of the left hemipelvis is intact without fracture. Possible small erosion along the inferior margin of the left sacroiliac joint  along the sacral aspect (series 6, image 67). Left femur is intact without fracture or dislocation. No cortical thickening or periosteal elevation. No lytic or sclerotic bone lesion. Mild-moderate tricompartmental joint space narrowing of the left knee, most pronounced within the medial compartment with small marginal osteophytes. 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 No knee joint effusion. Ligaments Suboptimally assessed by CT. Muscles and Tendons Unremarkable muscle bulk without atrophy or fatty infiltration. No evidence of tendinous abnormality by CT. Soft tissues Minimal subcutaneous edema at the anterior aspect of the thigh. No organized fluid collection. No deep fascial fluid or edema is seen. No inguinal lymphadenopathy. No soft tissue gas. No acute findings within the visualized portion of the pelvis. Mild scattered atherosclerotic calcifications are present. IMPRESSION: 1. No acute osseous abnormality of the left hip or femur. 2. Minimal subcutaneous edema at the anterior aspect of the left thigh, nonspecific. 3. 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 4. Possible small erosion along the inferior margin of the left sacroiliac joint, which may reflect sequela of sacroiliitis. Electronically Signed   By: Duanne Guess D.O.   On: 10/18/2020 16:14    Cardiac Studies   Echocardiogram  09/09/2020: Impressions: 1. Left ventricular ejection fraction, by estimation, is approximately  20%. The left ventricle has severely decreased function. The left  ventricle demonstrates global hypokinesis with regional variation as  noted. The left ventricular internal cavity  size was moderately dilated. Left ventricular diastolic parameters are  consistent with Grade III diastolic dysfunction (restrictive). No obvious  mural thrombus noted with Definity contrast.  2. Right ventricular systolic function is moderately to severely reduced.  The right ventricular size is normal. There is moderately elevated  pulmonary artery systolic pressure. The estimated right ventricular  systolic pressure is 52.0 mmHg.  3. Left atrial size was mild to moderately dilated.  4. Right atrial size was mild to moderately dilated.  5. The mitral valve is grossly normal. Mild mitral valve regurgitation.  6. The aortic valve is tricuspid. Aortic valve regurgitation is trivial.  7. The inferior vena cava is dilated in size with <50% respiratory  variability, suggesting right atrial pressure of 15 mmHg.  _______________  Lower Extremity Dopplers 10/15/2020: Summary: RIGHT:  - No evidence of common femoral vein obstruction.    LEFT:  - There is no evidence of deep vein thrombosis in the lower extremity.  - No cystic structure found in the popliteal fossa.   Patient Profile     Mr. 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 Caleb Popp.   Assessment & Plan    Acute on Chronic Combined CHF  - BNP elevated at 3,415.  - Chest x-ray showed cardiomegaly with central pulmonary vascular congestion and hazy bibasilar  opacities.  - Echo last month during another admission for CHF showed LVEF of 20% with global hypokinesis and grade 3 diastolic dysfunction. RV systolic function severely reduced and PASP moderately elevated.  - Currently on IV Lasix 40mg  twice daily. Documented urinary output of 1.3 L in the past 24 hours and net negative -9.3 L since admission. Weight 249 lbs today. Down 10 lbs from weight over the last 2 days and 14 lbs since  admission. Last documented weight during admission in 09/2020 was 263 lbs. Creatinine up slightly from yesterday.  - Patient does not appear significantly volume overloaded on exam.  - May be able to switch back to home Torsemide. Will discuss with MD. - Will increase Coreg to 6.25mg  twice daily. - Continue Bidil 1 tablet three times daily. - He was not on an ACEI/ARB/ARNI suspect due to renal function. However, he was on Spironolactone at home. - Continue daily weight, strict I/O's, and renal function.  - Unfortunately, patient's lifestyle choices are not conducive to healthy heart. Importance of alcohol and cocaine use has been discussed. Patient is not a candidate for ICD or advanced heart failure therapy given ongoing substance abuse. Social work has been consulted to assist with stable living and medication situation.   CAD  Demand Ischemic  - R/LHC in 05/2019 showed single vessel CAD with CTO of RCA with collaterals.  - High-sensitivity troponin minimally elevated and flat at 30 >>31. Not consistent with ACS. Likely demand ischemia in setting of CHF.  - No chest pain.  - Continue aspirin, beta-blocker, and high-intensity statin.  2nd Degree AV Block - Patient had transient 2nd degree AV block (looked like both type I and type II) overnight on 12/15. No recurrence since then. - Patient reports snoring and given body habitus, suspect he may have sleep apnea. - Will continue to monitor on telemetry.   Non-Sustained VT - Short runs of non-sustained VT noted on  telemetry.  - Potassium 3.9 today. - Magnesium 2.1 on 12/14. - Will increase Coreg as above. - Will continue to monitor for prolonged runs of NSVT.  Hypertension - BP mildly elevated (mostly diastolic). - Continue Bidil as above. - Will increase Coreg as above. - Hopefully will continue to improve with diuresis.   Hyperlipidemia  - Lipid panel from 09/2020: Total Cholesterol 164, Triglycerides 38, HDL 45, LDL 111.  - LDL goal <70 given CAD.  - Continue Lipitor.   AKI - Creatinine 1.66 on admission. Baseline around 1.1.  - Creatinine initially improved from admission but has been rising slightly over the last couple of days 1.26 >> 1.36 >> 1.44. - Continue to monitor.  Leg Pain - X-ray showed degenerative changes of left hip and knee but no acute abnormality. - CT showed mild to moderate osteoarthritis of left hip and knee with possible small erosion along inferior margin of left sacroiliac joint which may reflect sequela of sacroiliitis.  - Gabapentin 300mg  at night ordered and helped him sleep. Could consider increasing to three times daily. Will defer management to primary team.   Polysubstance Abuse  - Patient reports tobacco,alcohol,and cocaine use and admits that it will be extremely hard for him to staff off drugs in his current living situation.  - Importance of complete cessation has been discussed.  - Social work has been consulted.   For questions or updates, please contact [facility] Please consult [email or web address] for contact info under        Signed, , PA-C  10/20/2020, 7:12 AM

## 2020-10-20 NOTE — TOC Progression Note (Addendum)
Transition of Care [facility]) - Progression Note    Patient Details  Name: Dale [name] MRN: [id number] Date of Birth: [birth year]  [facility]) CM/SW Contact  Darleene Cleaver, [place] Phone Number: 10/20/2020, 5:14 PM  Clinical Narrative:    CSW attempted to contact Marylene Land at [facility], [phone] to discuss patient's living situation.  CSW was unable to reach her via phone.  CSW left message awaiting for call back.  CSW informed patient yesterday that there is not much that can be done, except give him information on shelters.  Patient stated he already knows where the shelter is and has spoken to them there.   Barriers to Discharge: No Barriers Identified  Patient does not want to return to his previous living situation.  Expected Discharge Plan and Services                                                 Social Determinants of Health (SDOH) Interventions    Readmission Risk Interventions Readmission Risk Prevention Plan 09/11/2020 03/03/2019  Transportation Screening Complete Complete  PCP or Specialist Appt within 3-5 Days Complete -  HRI or Home Care Consult Complete -  Social Work Consult for Recovery Care Planning/Counseling Complete -  Palliative Care Screening Complete -  Medication Review Oceanographer) Complete Complete  PCP or Specialist appointment within 3-5 days of discharge - Complete  HRI or Home Care Consult - (No Data)  SW Recovery Care/Counseling Consult - Complete  Palliative Care Screening - Not Applicable  Skilled Nursing Facility - Not Applicable  Some recent data might be hidden

## 2020-10-21 LAB — BASIC METABOLIC PANEL
Anion gap: 13 (ref 5–15)
BUN: 32 mg/dL — ABNORMAL HIGH (ref 6–20)
CO2: 21 mmol/L — ABNORMAL LOW (ref 22–32)
Calcium: 8.7 mg/dL — ABNORMAL LOW (ref 8.9–10.3)
Chloride: 104 mmol/L (ref 98–111)
Creatinine, Ser: 1.49 mg/dL — ABNORMAL HIGH (ref 0.61–1.24)
GFR, Estimated: 54 mL/min — ABNORMAL LOW (ref >60)
Glucose, Bld: 98 mg/dL (ref 70–99)
Potassium: 4.1 mmol/L (ref 3.5–5.1)
Sodium: 138 mmol/L (ref 135–145)

## 2020-10-21 MED ORDER — PANTOPRAZOLE SODIUM 40 MG PO TBEC: 40.0000 mg | DELAYED_RELEASE_TABLET | Freq: Every day | ORAL | 0 refills | Status: DC

## 2020-10-21 MED ORDER — ASPIRIN 81 MG PO TBEC: 81.0000 mg | DELAYED_RELEASE_TABLET | Freq: Every day | ORAL | 0 refills | Status: AC

## 2020-10-21 MED ORDER — ISOSORB DINITRATE-HYDRALAZINE 20-37.5 MG PO TABS: 1.0000 | ORAL_TABLET | Freq: Three times a day (TID) | ORAL | 0 refills | Status: DC

## 2020-10-21 MED ORDER — CARVEDILOL 6.25 MG PO TABS: 6.2500 mg | ORAL_TABLET | Freq: Two times a day (BID) | ORAL | 0 refills | Status: DC

## 2020-10-21 MED ORDER — TORSEMIDE 20 MG PO TABS: 20.0000 mg | ORAL_TABLET | Freq: Two times a day (BID) | ORAL | 0 refills | Status: DC

## 2020-10-21 MED ORDER — ATORVASTATIN CALCIUM 40 MG PO TABS: 40.0000 mg | ORAL_TABLET | Freq: Every day | ORAL | 0 refills | Status: DC

## 2020-10-21 MED ORDER — GABAPENTIN 300 MG PO CAPS: 300.0000 mg | ORAL_CAPSULE | Freq: Every day | ORAL | 0 refills | Status: DC

## 2020-10-21 NOTE — TOC Initial Note (Signed)
Transition of Care [facility]) - Initial/Assessment Note    Patient Details  Name: Dale [name] MRN: [id number] Date of Birth: 1961/01/14  [facility]) CM/SW Contact:    Armanda Heritage, RN Phone Number: 10/21/2020, 12:59 PM  Clinical Narrative:                 CM received notification that patient cannot afford medications and needs assistance with a ride.  CM noted that patient has [employer] and has a co-pay of 3$ for each prescription.  At this time no co-payment assistance programs are available.  CM did provide bedside RN with information about transportation services and the need to call the pick-up in by 3pm.  Should transportation be unable to assist with a ride, CM can provide a bus pass.  CM called patient to discuss medication needs.  CM attempted to provide patient with information about the [facility] which works with the homeless population and may have assistance for co-pay costs.  Patient responds "how am I supposed to get there if I don't have a car?".   Patient also mentions that a friend told him the hospital paid for their medications, patient demanding why the hospital  paid for his friend but not for him.   CM attempted to discuss this and go over transportation help available to get him to the [facility], unfortunately patient hung up the phone and would not talk with CM.    Barriers to Discharge: No Barriers Identified   Patient Goals and CMS Choice     Choice offered to / list presented to : Patient  Expected Discharge Plan and Services           Expected Discharge Date: 10/21/20                                    Prior Living Arrangements/Services                       Activities of Daily Living Home Assistive Devices/Equipment: None ADL Screening (condition at time of admission) Patient's cognitive ability adequate to safely complete daily activities?: Yes Is the patient deaf or have difficulty hearing?: No Does the patient  have difficulty seeing, even when wearing glasses/contacts?: No Does the patient have difficulty concentrating, remembering, or making decisions?: No Patient able to express need for assistance with ADLs?: Yes Does the patient have difficulty dressing or bathing?: No Independently performs ADLs?: Yes (appropriate for developmental age) Does the patient have difficulty walking or climbing stairs?: Yes Weakness of Legs: Left Weakness of Arms/Hands: None  Permission Sought/Granted                  Emotional Assessment              Admission diagnosis:  Acute on chronic combined systolic and diastolic HF (heart failure) (HCC) [I50.43] AKI (acute kidney injury) (HCC) [N17.9] Acute on chronic congestive heart failure, unspecified heart failure type (HCC) [I50.9] Patient Active Problem List   Diagnosis Date Noted  . AKI (acute kidney injury) (HCC) 10/16/2020  . 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 Acute on chronic combined systolic (congestive) and diastolic (congestive) heart failure (HCC)   . Acute CHF (congestive heart failure) (HCC) 05/07/2019  . Nausea & vomiting 05/07/2019  . Acute exacerbation of CHF (congestive heart failure) (HCC) 03/11/2019  .  Troponin level elevated 03/11/2019  . Hyperlipidemia 03/03/2019  . NSVT (nonsustained ventricular tachycardia) (HCC) 03/03/2019  . Anxiety and depression   . NSTEMI (non-ST elevated myocardial infarction) (HCC) 02/28/2019  . Acute systolic heart failure (HCC) 11/08/2018  . Hypertensive urgency 09/28/2018  . Acute pulmonary edema (HCC) 09/28/2018  . Cocaine abuse (HCC) 09/28/2018  . Substance abuse (HCC) 09/28/2018  . Chronic kidney disease, stage II (mild) 09/28/2018  . Normochromic anemia 09/28/2018  . Acute systolic CHF (congestive heart failure) (HCC) 09/27/2018  . Essential hypertension 03/11/2016  . Obesity 03/11/2016  . Glucosuria 03/11/2016   PCP:  Grayce Sessions, NP Pharmacy:   [facility] - [place], [place] - [address] [place] [address] Phone: [phone] Fax: [phone]  [facility] [address] [place] - [address] [place] [address] Phone: [phone] Fax: [phone]     Social Determinants of Health (SDOH) Interventions    Readmission Risk Interventions Readmission Risk Prevention Plan 09/11/2020 03/03/2019  Transportation Screening Complete Complete  PCP or Specialist Appt within 3-5 Days Complete -  HRI or Home Care Consult Complete -  Social Work Consult for Recovery Care Planning/Counseling Complete -  Palliative Care Screening Complete -  Medication Review Oceanographer) Complete Complete  PCP or Specialist appointment within 3-5 days of discharge - Complete  HRI or Home Care Consult - (No Data)  SW Recovery Care/Counseling Consult - Complete  Palliative Care Screening - Not Applicable  Skilled Nursing Facility - Not Applicable  Some recent data might be hidden

## 2020-10-21 NOTE — Plan of Care (Signed)

## 2020-10-21 NOTE — Discharge Summary (Signed)
Physician Discharge Summary  Dale [name] ZOX:[id number] DOB: 06-10-1961 DOA: 10/15/2020  PCP: Grayce Sessions, NP  Admit date: 10/15/2020 Discharge date: 10/21/2020  Admitted From: Home Disposition:  Same   Recommendations for Outpatient Follow-up:  1. Follow up with PCP in 1 week 2. Follow up with Cardiology in 1-2 weeks   Continue Coreg twice daily Continue BiDil 3 times daily Continue twice daily torsemide  Discharge Condition: Stable CODE STATUS: Full  Diet recommendation:  Diet Orders (From admission, onward)    Start     Ordered   10/21/20 0000  Diet - low sodium heart healthy        10/21/20 0920   10/15/20 1209  Diet Heart Room service appropriate? Yes; Fluid consistency: Thin  Diet effective now       Question Answer Comment  Room service appropriate? Yes   Fluid consistency: Thin      10/15/20 1208         Brief/Interim Summary: Dale [name] is a 59 y.o.malewith medical history significant ofcocaine abuse, chronic combined CHF, CAD. He presents with 3 days of edema in BLE. He says he has not changed his diet or done anything different in the last three days. He tried to take his torsemide to help, but it doesn't change anything. He tried elevating his legs, but this only gives temporary relief. He reports cough w/ some mucus production over the same time. Tried delsym and robitussin, but only partial relief. Over the last day or so he reports groin pain w/ walking. He has tried no intervention for that. Work up revealed elevated BNP.  Patient was admitted due to CHF exacerbation.  Cardiology was consulted. Work up for left leg pain has been unremarkable, including xray and CT. He was diuresed and stabilized, weaned to PO medications. He was discharged home in stable condition to follow up closely with cardiology.   Discharge Diagnoses:  Principal Problem:   Acute on chronic combined systolic and diastolic HF (heart failure) (HCC) Active Problems:    Essential hypertension   Cocaine abuse (HCC)   Hyperlipidemia   AKI (acute kidney injury) (HCC)   Acute on chronic combined systolic and diastolic heart failure -Last EF 20% with grade III diastolic dysfunction. Elevated BNP 3415.1 on admission. Mildly elevated but flat troponin. Started on Lasix IV. Patient with a history of on-going cocaine abuse. He was on Coreg, spironolactone and torsemide -Continue Coreg twice daily -Continue BiDil 3 times daily -Continue twice daily torsemide -Appreciate cardiology, follow up as outpatient   Left thigh pain -Venous duplex negative for DVT -CK 82 -X-ray revealed degenerative changes in left hip, knee without acute abnormality -CT left hip and femur without acute osseous abnormality.  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 Creatinine of 1.66 on admissionand trending down with diuresis -Renal ultrasound unremarkable -Creatinine has plateaued at 1.3-1.5  CAD -Continue aspirin, Lipitor  Essential hypertension -Continue Coreg and Bidil  Demand ischemia -Troponin trend is flat  Concern for OSA -Recommend outpatient sleep study  Polysubstance abuse -Reported alcohol, tobacco, cocaine use   Discharge Instructions  Discharge Instructions    (HEART FAILURE PATIENTS) Call MD:  Anytime you have any of the following symptoms: 1) 3 pound weight gain in  24 hours or 5 pounds in 1 week 2) shortness of breath, with or without a dry hacking cough 3) swelling in the hands, feet or stomach 4) if you have to sleep on extra pillows at night in order to breathe.   Complete by: As directed    Call MD for:  difficulty breathing, headache or visual disturbances    Complete by: As directed    Call MD for:  extreme fatigue   Complete by: As directed    Call MD for:  persistant dizziness or light-headedness   Complete by: As directed    Call MD for:  persistant nausea and vomiting   Complete by: As directed    Call MD for:  severe uncontrolled pain   Complete by: As directed    Call MD for:  temperature >100.4   Complete by: As directed    Diet - low sodium heart healthy   Complete by: As directed    Discharge instructions   Complete by: As directed    You were cared for by a hospitalist during your hospital stay. If you have any questions about your discharge medications or the care you received while you were in the hospital after you are discharged, you can call the unit and ask to speak with the hospitalist on call if the hospitalist that took care of you is not available. Once you are discharged, your primary care physician will handle any further medical issues. Please note that NO REFILLS for any discharge medications will be authorized once you are discharged, as it is imperative that you return to your primary care physician (or establish a relationship with a primary care physician if you do not have one) for your aftercare needs so that they can reassess your need for medications and monitor your lab values.   Increase activity slowly   Complete by: As directed      Allergies as of 10/21/2020      Reactions   Hydrocodone Itching      Medication List    STOP taking these medications   clopidogrel 75 MG tablet Commonly known as: PLAVIX   prazosin 1 MG capsule Commonly known as: MINIPRESS   spironolactone 25 MG tablet Commonly known as: ALDACTONE     TAKE these medications   aspirin 81 MG EC tablet Take 1 tablet (81 mg total) by mouth daily.   atorvastatin 40 MG tablet Commonly known as: LIPITOR Take 1 tablet (40 mg total) by mouth daily. What changed:   medication strength  how much to take  additional instructions    carvedilol 6.25 MG tablet Commonly known as: COREG Take 1 tablet (6.25 mg total) by mouth 2 (two) times daily with a meal. What changed:   medication strength  how much to take  additional instructions   diclofenac sodium 1 % Gel Commonly known as: VOLTAREN Apply 2 g topically 4 (four) times daily as needed (pain).   gabapentin 300 MG capsule Commonly known as: NEURONTIN Take 1 capsule (300 mg total) by mouth at bedtime.   isosorbide-hydrALAZINE 20-37.5 MG tablet Commonly known as: BIDIL Take 1 tablet by mouth 3 (three) times daily.   pantoprazole 40 MG tablet Commonly known as: PROTONIX Take 1 tablet (40 mg total) by mouth daily.   torsemide 20 MG tablet Commonly known as: DEMADEX Take 1 tablet (20 mg total) by mouth 2 (two) times daily. What changed:   when to take this  additional instructions  Follow-up Information    Grayce Sessions, NP. Schedule an appointment as soon as possible for a visit in 1 week(s).   Specialty: Internal Medicine Contact information: [address] [place] [address] [phone]        Nahser, Deloris Ping, MD. Schedule an appointment as soon as possible for a visit in 1 week(s).   Specialty: Cardiology Contact information: [address] [place] [address] [phone]              Allergies  Allergen Reactions  . Hydrocodone Itching    Consultations:  Cardiology    Procedures/Studies: DG Chest 2 View  Result Date: 10/15/2020 CLINICAL DATA:  SOB EXAM: CHEST - 2 VIEW COMPARISON:  09/08/2020 and prior FINDINGS: Cardiomegaly and central pulmonary vascular congestion. No pneumothorax or pleural effusion. Hazy bibasilar opacities. No acute osseous abnormality. IMPRESSION: Hazy bibasilar opacities. Differential includes atelectasis, edema or infection. Cardiomegaly and central pulmonary vascular congestion. Electronically Signed   By: Stana Bunting M.D.   On: 10/15/2020 07:38    US RENAL  Result Date: 10/15/2020 CLINICAL DATA:  Acute kidney injury EXAM: RENAL / URINARY TRACT ULTRASOUND COMPLETE COMPARISON:  CT renal colic 05/07/2019 FINDINGS: Right Kidney: Renal measurements: 10.6 x 7 x 6.8 = volume: 260 mL. Echogenicity is within normal limits. No concerning renal mass, shadowing calculus or hydronephrosis. Left Kidney: Renal measurements: 10.4 x 5.4 x 5.4 cm = volume: 160 mL. Echogenicity is within normal limits. No concerning renal mass, shadowing calculus or hydronephrosis. Bladder: Unremarkable for the degree of distention. Other: None. IMPRESSION: Unremarkable renal ultrasound. Electronically Signed   By: Kreg Shropshire M.D.   On: 10/15/2020 15:24   CT HIP LEFT WO CONTRAST  Result Date: 10/18/2020 CLINICAL DATA:  Pain and swelling of the left hip, groin, and upper leg for 6 days. No known injury. EXAM: CT OF THE LOWER LEFT EXTREMITY WITHOUT CONTRAST TECHNIQUE: Multidetector CT imaging of the lower left extremity was performed according to the standard protocol. COMPARISON:  X-ray 10/17/2020, CT 05/07/2019 FINDINGS: Bones/Joint/Cartilage Left hip joint is intact without fracture or dislocation. Mild hip joint space narrowing. Small subchondral cyst at the superior acetabulum. Marginal osteophytes of the femoral head. No evidence of femoral head avascular necrosis. Visualized portion of the left hemipelvis is intact without fracture. Possible small erosion along the inferior margin of the left sacroiliac joint along the sacral aspect (series 6, image 67). Left femur is intact without fracture or dislocation. No cortical thickening or periosteal elevation. No lytic or sclerotic bone lesion. Mild-moderate tricompartmental joint space narrowing of the left knee, most pronounced within the medial compartment with small marginal osteophytes. 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 No knee joint effusion. Ligaments  Suboptimally assessed by CT. Muscles and Tendons Unremarkable muscle bulk without atrophy or fatty infiltration. No evidence of tendinous abnormality by CT. Soft tissues Minimal subcutaneous edema at the anterior aspect of the thigh. No organized fluid collection. No deep fascial fluid or edema is seen. No inguinal lymphadenopathy. No soft tissue gas. No acute findings within the visualized portion of the pelvis. Mild scattered atherosclerotic calcifications are present. IMPRESSION: 1. No acute osseous abnormality of the left hip or femur. 2. Minimal subcutaneous edema at the anterior aspect of the left thigh, nonspecific. 3. 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 4. Possible small erosion along the inferior margin of the left sacroiliac joint, which may reflect sequela of sacroiliitis. Electronically Signed  By: Duanne Guess D.O.   On: 10/18/2020 16:14   CT FEMUR LEFT WO CONTRAST  Result Date: 10/18/2020 CLINICAL DATA:  Pain and swelling of the left hip, groin, and upper leg for 6 days. No known injury. EXAM: CT OF THE LOWER LEFT EXTREMITY WITHOUT CONTRAST TECHNIQUE: Multidetector CT imaging of the lower left extremity was performed according to the standard protocol. COMPARISON:  X-ray 10/17/2020, CT 05/07/2019 FINDINGS: Bones/Joint/Cartilage Left hip joint is intact without fracture or dislocation. Mild hip joint space narrowing. Small subchondral cyst at the superior acetabulum. Marginal osteophytes of the femoral head. No evidence of femoral head avascular necrosis. Visualized portion of the left hemipelvis is intact without fracture. Possible small erosion along the inferior margin of the left sacroiliac joint along the sacral aspect (series 6, image 67). Left femur is intact without fracture or dislocation. No cortical thickening or periosteal elevation. No lytic or sclerotic bone lesion. Mild-moderate tricompartmental joint space narrowing of the left knee, most pronounced  within the medial compartment with small marginal osteophytes. 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 No knee joint effusion. Ligaments Suboptimally assessed by CT. Muscles and Tendons Unremarkable muscle bulk without atrophy or fatty infiltration. No evidence of tendinous abnormality by CT. Soft tissues Minimal subcutaneous edema at the anterior aspect of the thigh. No organized fluid collection. No deep fascial fluid or edema is seen. No inguinal lymphadenopathy. No soft tissue gas. No acute findings within the visualized portion of the pelvis. Mild scattered atherosclerotic calcifications are present. IMPRESSION: 1. No acute osseous abnormality of the left hip or femur. 2. Minimal subcutaneous edema at the anterior aspect of the left thigh, nonspecific. 3. 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 4. Possible small erosion along the inferior margin of the left sacroiliac joint, which may reflect sequela of sacroiliitis. Electronically Signed   By: Duanne Guess D.O.   On: 10/18/2020 16:14   DG FEMUR 1V LEFT  Result Date: 10/17/2020 CLINICAL DATA:  Left upper thigh/groin pain. EXAM: LEFT FEMUR 1 VIEW COMPARISON:  No prior. FINDINGS: Corticated tiny lucency in the lower femur most likely vascular marking. No acute bony abnormality. No evidence of fracture or dislocation. Degenerative changes left hip and knee. IMPRESSION: Degenerative changes left hip and knee. No acute abnormality. Electronically Signed   By: Maisie Fus  Register   On: 10/17/2020 15:37   VAS [place] LOWER EXTREMITY VENOUS (DVT) ([facility] and [facility] 7a-7p)  Result Date: 10/16/2020  Lower Venous DVT Study Indications: Swelling, and Edema.  Comparison Study: 08/24/20 previous Performing Technologist: Blanch Media RVS  Examination Guidelines: A complete evaluation includes B-mode imaging, spectral Doppler, color Doppler, and power Doppler as needed of all accessible  portions of each vessel. Bilateral testing is considered an integral part of a complete examination. Limited examinations for reoccurring indications may be performed as noted. The reflux portion of the exam is performed with the patient in reverse Trendelenburg.  +-----+---------------+---------+-----------+----------+--------------+ RIGHTCompressibilityPhasicitySpontaneityPropertiesThrombus Aging +-----+---------------+---------+-----------+----------+--------------+ CFV  Full           Yes      Yes                                 +-----+---------------+---------+-----------+----------+--------------+   +---------+---------------+---------+-----------+----------+--------------+ LEFT     CompressibilityPhasicitySpontaneityPropertiesThrombus Aging +---------+---------------+---------+-----------+----------+--------------+ CFV      Full           Yes      Yes                                 +---------+---------------+---------+-----------+----------+--------------+  SFJ      Full                                                        +---------+---------------+---------+-----------+----------+--------------+ FV Prox  Full                                                        +---------+---------------+---------+-----------+----------+--------------+ FV Mid   Full                                                        +---------+---------------+---------+-----------+----------+--------------+ FV DistalFull                                                        +---------+---------------+---------+-----------+----------+--------------+ PFV      Full                                                        +---------+---------------+---------+-----------+----------+--------------+ POP      Full           Yes      Yes                                 +---------+---------------+---------+-----------+----------+--------------+ PTV      Full                                                         +---------+---------------+---------+-----------+----------+--------------+ PERO     Full                                                        +---------+---------------+---------+-----------+----------+--------------+     Summary: RIGHT: - No evidence of common femoral vein obstruction.  LEFT: - There is no evidence of deep vein thrombosis in the lower extremity.  - No cystic structure found in the popliteal fossa.  *See table(s) above for measurements and observations. Electronically signed by Fabienne Bruns MD on 10/16/2020 at 7:42:57 PM.    Final       Discharge Exam: Vitals:   10/20/20 2009 10/21/20 0424  BP: 123/87 120/82  Pulse: 97 95  Resp: 18 16  Temp: 97.6 F (36.4 C) 98.6 F (37 C)  SpO2: 98% 100%    General: Pt is alert, awake, not  in acute distress Cardiovascular: RRR, S1/S2 +, trace edema Respiratory: CTA bilaterally, no wheezing, no rhonchi, no respiratory distress, no conversational dyspnea, on room air  Abdominal: Soft, NT, ND, bowel sounds + Extremities: no edema, no cyanosis Psych: Normal mood and affect, stable judgement and insight     The results of significant diagnostics from this hospitalization (including imaging, microbiology, ancillary and laboratory) are listed below for reference.     Microbiology: Recent Results (from the past 240 hour(s))  Resp Panel by RT-PCR (Flu A&B, Covid) Nasopharyngeal Swab     Status: None   Collection Time: 10/15/20 10:51 AM   Specimen: Nasopharyngeal Swab; Nasopharyngeal(NP) swabs in vial transport medium  Result Value Ref Range Status   SARS Coronavirus 2 by RT PCR NEGATIVE NEGATIVE Final    Comment: (NOTE) SARS-CoV-2 target nucleic acids are NOT DETECTED.  The SARS-CoV-2 RNA is generally detectable in upper respiratory specimens during the acute phase of infection. The lowest concentration of SARS-CoV-2 viral copies this assay can detect is 138 copies/mL. A  negative result does not preclude SARS-Cov-2 infection and should not be used as the sole basis for treatment or other patient management decisions. A negative result may occur with  improper specimen collection/handling, submission of specimen other than nasopharyngeal swab, presence of viral mutation(s) within the areas targeted by this assay, and inadequate number of viral copies(<138 copies/mL). A negative result must be combined with clinical observations, patient history, and epidemiological information. The expected result is Negative.  Fact Sheet for Patients:  BloggerCourse.com  Fact Sheet for Healthcare Providers:  SeriousBroker.it  This test is no t yet approved or cleared by the [place] FDA and  has been authorized for detection and/or diagnosis of SARS-CoV-2 by FDA under an Emergency Use Authorization (EUA). This EUA will remain  in effect (meaning this test can be used) for the duration of the COVID-19 declaration under Section 564(b)(1) of the Act, 21 U.S.C.section 360bbb-3(b)(1), unless the authorization is terminated  or revoked sooner.       Influenza A by PCR NEGATIVE NEGATIVE Final   Influenza B by PCR NEGATIVE NEGATIVE Final    Comment: (NOTE) The Xpert Xpress SARS-CoV-2/FLU/RSV plus assay is intended as an aid in the diagnosis of influenza from Nasopharyngeal swab specimens and should not be used as a sole basis for treatment. Nasal washings and aspirates are unacceptable for Xpert Xpress SARS-CoV-2/FLU/RSV testing.  Fact Sheet for Patients: BloggerCourse.com  Fact Sheet for Healthcare Providers: SeriousBroker.it  This test is not yet approved or cleared by the [place] FDA and has been authorized for detection and/or diagnosis of SARS-CoV-2 by FDA under an Emergency Use Authorization (EUA). This EUA will remain in effect (meaning this test can  be used) for the duration of the COVID-19 declaration under Section 564(b)(1) of the Act, 21 U.S.C. section 360bbb-3(b)(1), unless the authorization is terminated or revoked.  Performed at [facility], [address] [place], [place] [address]      Labs: BNP (last 3 results) Recent Labs    08/28/20 0027 09/08/20 1135 10/15/20 0700  BNP 2,837.4* 2,683.5* 3,415.1*   Basic Metabolic Panel: Recent Labs  Lab 10/17/20 0358 10/18/20 0421 10/19/20 0418 10/20/20 0427 10/21/20 0507  NA 137 138 137 139 138  K 3.7 3.7 3.4* 3.9 4.1  CL 106 104 105 105 104  CO2 21* 21*  GLUCOSE 98 94 144* 87 98  BUN 25* 22* 24* 26* 32*  CREATININE 1.36* 1.26* 1.36* 1.44*  1.49*  CALCIUM 8.2* 8.2* 8.4* 8.5* 8.7*  MG 2.1  --   --   --   --    Liver Function Tests: Recent Labs  Lab 10/16/20 0417  AST 16  ALT 16  ALKPHOS 49  BILITOT 0.7  PROT 5.6*  ALBUMIN 2.8*   No results for input(s): LIPASE, AMYLASE in the last 168 hours. No results for input(s): AMMONIA in the last 168 hours. CBC: Recent Labs  Lab 10/15/20 0700 10/16/20 0417  WBC 7.4 6.5  NEUTROABS 5.3  --   HGB 12.5* 12.2*  HCT 38.9* 38.1*  MCV 87.0 87.4  PLT 309 275   Cardiac Enzymes: Recent Labs  Lab 10/17/20 0358  CKTOTAL 82   BNP: Invalid input(s): POCBNP CBG: No results for input(s): GLUCAP in the last 168 hours. D-Dimer No results for input(s): DDIMER in the last 72 hours. Hgb A1c No results for input(s): HGBA1C in the last 72 hours. Lipid Profile No results for input(s): CHOL, HDL, LDLCALC, TRIG, CHOLHDL, LDLDIRECT in the last 72 hours. Thyroid function studies No results for input(s): TSH, T4TOTAL, T3FREE, THYROIDAB in the last 72 hours.  Invalid input(s): FREET3 Anemia work up No results for input(s): VITAMINB12, FOLATE, FERRITIN, TIBC, IRON, RETICCTPCT in the last 72 hours. Urinalysis    Component Value Date/Time   COLORURINE COLORLESS (A) 02/28/2019 1309   APPEARANCEUR  CLEAR 02/28/2019 1309   LABSPEC 1.004 (L) 02/28/2019 1309   PHURINE 7.0 02/28/2019 1309   GLUCOSEU NEGATIVE 02/28/2019 1309   HGBUR NEGATIVE 02/28/2019 1309   BILIRUBINUR NEGATIVE 02/28/2019 1309   KETONESUR NEGATIVE 02/28/2019 1309   PROTEINUR NEGATIVE 02/28/2019 1309   UROBILINOGEN 0.2 03/11/2016 0934   NITRITE NEGATIVE 02/28/2019 1309   LEUKOCYTESUR NEGATIVE 02/28/2019 1309   Sepsis Labs Invalid input(s): PROCALCITONIN,  WBC,  LACTICIDVEN Microbiology Recent Results (from the past 240 hour(s))  Resp Panel by RT-PCR (Flu A&B, Covid) Nasopharyngeal Swab     Status: None   Collection Time: 10/15/20 10:51 AM   Specimen: Nasopharyngeal Swab; Nasopharyngeal(NP) swabs in vial transport medium  Result Value Ref Range Status   SARS Coronavirus 2 by RT PCR NEGATIVE NEGATIVE Final    Comment: (NOTE) SARS-CoV-2 target nucleic acids are NOT DETECTED.  The SARS-CoV-2 RNA is generally detectable in upper respiratory specimens during the acute phase of infection. The lowest concentration of SARS-CoV-2 viral copies this assay can detect is 138 copies/mL. A negative result does not preclude SARS-Cov-2 infection and should not be used as the sole basis for treatment or other patient management decisions. A negative result may occur with  improper specimen collection/handling, submission of specimen other than nasopharyngeal swab, presence of viral mutation(s) within the areas targeted by this assay, and inadequate number of viral copies(<138 copies/mL). A negative result must be combined with clinical observations, patient history, and epidemiological information. The expected result is Negative.  Fact Sheet for Patients:  BloggerCourse.com  Fact Sheet for Healthcare Providers:  SeriousBroker.it  This test is no t yet approved or cleared by the [place] FDA and  has been authorized for detection and/or diagnosis of SARS-CoV-2 by FDA  under an Emergency Use Authorization (EUA). This EUA will remain  in effect (meaning this test can be used) for the duration of the COVID-19 declaration under Section 564(b)(1) of the Act, 21 U.S.C.section 360bbb-3(b)(1), unless the authorization is terminated  or revoked sooner.       Influenza A by PCR NEGATIVE NEGATIVE Final   Influenza B by PCR NEGATIVE NEGATIVE  Final    Comment: (NOTE) The Xpert Xpress SARS-CoV-2/FLU/RSV plus assay is intended as an aid in the diagnosis of influenza from Nasopharyngeal swab specimens and should not be used as a sole basis for treatment. Nasal washings and aspirates are unacceptable for Xpert Xpress SARS-CoV-2/FLU/RSV testing.  Fact Sheet for Patients: BloggerCourse.com  Fact Sheet for Healthcare Providers: SeriousBroker.it  This test is not yet approved or cleared by the [place] FDA and has been authorized for detection and/or diagnosis of SARS-CoV-2 by FDA under an Emergency Use Authorization (EUA). This EUA will remain in effect (meaning this test can be used) for the duration of the COVID-19 declaration under Section 564(b)(1) of the Act, 21 U.S.C. section 360bbb-3(b)(1), unless the authorization is terminated or revoked.  Performed at [facility], [address], [place], [place] [address]      Patient was seen and examined on the day of discharge and was found to be in stable condition. Time coordinating discharge: 25 minutes including assessment and coordination of care, as well as examination of the patient.   SIGNED:  Noralee Stain, DO [facility] 10/21/2020, 9:20 AM

## 2020-10-21 NOTE — Progress Notes (Signed)
Pt discharge to [address] [place], instructions reviewed with pt; acknowledges understanding and has information as to where to pick up prescriptions and [facility] information and number to call. SRP, RN

## 2020-10-21 NOTE — Progress Notes (Signed)
Progress Note  Patient Name: Dale [name] Date of Encounter: 10/21/2020  [facility] Cardiologist: Kristeen Miss, MD   Subjective   No acute events overnight.  Patient tells me he continues to improve although he does still feel some swelling in his legs.  Breathing is good.  Inpatient Medications    Scheduled Meds: . aspirin EC  81 mg Oral Daily  . atorvastatin  40 mg Oral QHS  . carvedilol  6.25 mg Oral BID WC  . enoxaparin (LOVENOX) injection  60 mg Subcutaneous Q24H  . gabapentin  300 mg Oral QHS  . isosorbide-hydrALAZINE  1 tablet Oral TID  . pantoprazole  40 mg Oral Daily  . potassium chloride  40 mEq Oral Daily  . torsemide  40 mg Oral Daily   Continuous Infusions:  PRN Meds: acetaminophen, ondansetron **OR** ondansetron (ZOFRAN) IV   Vital Signs    Vitals:   10/20/20 0609 10/20/20 1234 10/20/20 2009 10/21/20 0424  BP: (!) 129/93 100/62 123/87 120/82  Pulse: 97 92 97 95  Resp: 20 18 18 16   Temp: 98.4 F (36.9 C) 98 F (36.7 C) 97.6 F (36.4 C) 98.6 F (37 C)  TempSrc: Oral Oral Oral Oral  SpO2: 97% 98% 98% 100%  Weight: 113 kg   115 kg  Height:        Intake/Output Summary (Last 24 hours) at 10/21/2020 0843 Last data filed at 10/20/2020 2300 Gross per 24 hour  Intake 1300 ml  Output 2550 ml  Net -1250 ml   Last 3 Weights 10/21/2020 10/20/2020 10/18/2020  Weight (lbs) 253 lb 9.6 oz 249 lb 1.6 oz 259 lb 0.7 oz  Weight (kg) 115.032 kg 112.991 kg 117.5 kg      Telemetry    Personally Reviewed  ECG    No new  Physical Exam   GEN: No acute distress.   Neck: No JVD Cardiac: RRR, no murmurs, rubs, or gallops.  Respiratory: Clear to auscultation bilaterally. GI: Soft, nontender, non-distended  MS:  Trace to 1+ pitting edema to bilateral knees; No deformity. Neuro:  Nonfocal  Psych: Normal affect   Labs    High Sensitivity Troponin:   Recent Labs  Lab 10/15/20 0700 10/15/20 0905  TROPONINIHS 30* 31*      Chemistry Recent  Labs  Lab 10/16/20 0417 10/17/20 0358 10/19/20 0418 10/20/20 0427 10/21/20 0507  NA 139   < > 137 139 138  K 3.5   < > 3.4* 3.9 4.1  CL 104   < > 105 105 104  CO2 24   < > 23 22 21*  GLUCOSE 119*   < > 144* 87 98  BUN 26*   < > 24* 26* 32*  CREATININE 1.61*   < > 1.36* 1.44* 1.49*  CALCIUM 8.2*   < > 8.4* 8.5* 8.7*  PROT 5.6*  --   --   --   --   ALBUMIN 2.8*  --   --   --   --   AST 16  --   --   --   --   ALT 16  --   --   --   --   ALKPHOS 49  --   --   --   --   BILITOT 0.7  --   --   --   --   GFRNONAA 50*   < > >60 57* 54*  ANIONGAP 11   < > 9 12 13    < > =  values in this interval not displayed.     Hematology Recent Labs  Lab 10/15/20 0700 10/16/20 0417  WBC 7.4 6.5  RBC 4.47 4.36  HGB 12.5* 12.2*  HCT 38.9* 38.1*  MCV 87.0 87.4  MCH 28.0 28.0  MCHC 32.1 32.0  RDW 15.2 15.0  PLT 309 275    BNP Recent Labs  Lab 10/15/20 0700  BNP 3,415.1*     DDimer No results for input(s): DDIMER in the last 168 hours.   Radiology    No results found.  Cardiac Studies   No new  Review of telemetry shows frequent ectopy brief NSVT.  Assessment & Plan    [name] is a 59 year old man admitted with acute on chronic combined systolic and diastolic heart failure.  He has improved significantly with IV diuretics and has recently been changed to his home torsemide.  His ongoing drug abuse makes management of his advanced heart failure difficult.  1.  Acute on chronic combined systolic congestive heart failure Warm and still slightly volume overloaded. Continue Coreg twice daily Continue BiDil 3 times daily Continue twice daily torsemide Likely able to discharge with close follow-up to ensure volume status remains optimized on torsemide.  2.  NSVT Continues to have ectopy on telemetry without sustained arrhythmias Continue heart failure optimization and Coreg  3. AKI Creatinine stabilized on his home torsemide    For questions or updates, please  contact [facility] Please consult [email or web address] for contact info under        Signed, Lanier Prude, MD  10/21/2020, 8:43 AM

## 2020-10-23 ENCOUNTER — Telehealth: Payer: Self-pay

## 2020-10-23 ENCOUNTER — Telehealth: Payer: Self-pay | Admitting: Licensed Clinical Social Worker

## 2020-10-23 ENCOUNTER — Telehealth: Payer: Self-pay | Admitting: Cardiovascular Disease

## 2020-10-23 NOTE — Telephone Encounter (Signed)
Patient is requesting to speak with the social worker. He is inquiring about eligibility for a prepaid phone to contact our office. He states a friend of his received a phone from our office and he would like to know if we are still giving them to patient's. Please advise.

## 2020-10-23 NOTE — Telephone Encounter (Signed)
CSW received request to follow up with patient regarding cell phone needed. CSW attempted to reach patient at the hotel although unsuccessful with speaking with anyone at the front desk and unable to transfer to patient's room. CSW contacted patient's Mom who was unaware that he was discharged from the hospital. CSW left number with patient;s mother for return call if she hears from patient. [name], LCSW, CCSW-MCS [phone]

## 2020-10-23 NOTE — Telephone Encounter (Signed)
Transition Care Management Follow-up Telephone Call  Date of discharge and from where: 10/21/2020, [facility]   How have you been since you were released from the hospital? He said he does not have any medications.  He was also upset with his current housing situation. He explained that he is staying at the Extended Stay with someone else but he needs to get out of there because the environment is not good for his health. He said he is still using drugs and knows that he needs to stop but he can't stop in his current environment.  He said he has worked with [employer] about housing options. He does have a cell phone.  The phone that he can be reached at is the motel phone but he is not always there.  He has a friend with a phone but he can't rely on that.    This CM to call [facility] and inform her that he will be calling her to discuss housing and he said that he would call her in about 30 minutes.  This CM spoke to [facility] and she said she would speak to him when he calls but she does not have anything available for him right now  She has tried to contact him in the past to discuss housing but she had not been able to reach him.   Any questions or concerns? Yes - noted above.   Items Reviewed:  Did the pt receive and understand the discharge instructions provided? Yes   Medications obtained and verified? No  - he has no medications. He said that he can't afford them , even though the co-pays are $3 each.  He does not receive his disability check until after 11/04/2020.  Explained to him that he has insurance and can put the charges on an account at [facility], and pay the charges off when he is able.  He then spoke about his housing again but said he would be at his appointment with Ms Randa Evens, NP tomorrow.   Other? No   Any new allergies since your discharge? No   Do you have support at home? No   Home Care and Equipment/Supplies: Were home health  services ordered? no If so, what is the name of the agency? n/a  Has the agency set up a time to come to the patient's home? n/a Were any new equipment or medical supplies ordered?  No What is the name of the medical supply agency? n/a Were you able to get the supplies/equipment?n/a Do you have any questions related to the use of the equipment or supplies? No, n/a  Functional Questionnaire: (I = Independent and D = Dependent) ADLs: independent Follow up appointments reviewed:   PCP Hospital f/u appt confirmed? Yes  - Gwinda Passe, NP 10/24/2020  Specialist Hospital f/u appt confirmed? No  - needs to schedule appointment with cardiology  Are transportation arrangements needed? he will need to take the bus. informed him that he can contact his insurance company about transportation to medical appoinmts however, they may not be able to arrange transportation for tomorrow's appointment at a short notice.  If their condition worsens, is the pt aware to call PCP or go to the Emergency Dept.?yes  Was the patient provided with contact information for the PCP's office or ED? He does not have a phone.  The number for [facility] is on his AVS  Was to pt encouraged to call back with questions or concerns?yes

## 2020-10-23 NOTE — Telephone Encounter (Signed)
CSW received return call form patient regarding need for cell phone. CSW explained that due to his medicaid he would be eligible for a free phone through the government plan. Patient acknowledged understanding and how to obtain. CSW explained that the government issued phone would provide minutes free of charge each month. Patient will explore government medicaid phone. CSW available as needed. [name], LCSW, CCSW-MCS [phone]

## 2020-10-23 NOTE — Telephone Encounter (Signed)
Attempted to call the pt back at number provided.  Appears this number is from a hotel, and no room number provided.  Hotel is limited on giving out patient information and room stay.  Will send this message to our Social Worker [name], to see if she has advisement on pts request to get a prepaid phone, for his friend just received one from our Practice.

## 2020-10-24 ENCOUNTER — Other Ambulatory Visit: Payer: Self-pay

## 2020-10-24 ENCOUNTER — Encounter (INDEPENDENT_AMBULATORY_CARE_PROVIDER_SITE_OTHER): Payer: Medicaid Other | Admitting: Primary Care

## 2020-10-24 ENCOUNTER — Encounter (INDEPENDENT_AMBULATORY_CARE_PROVIDER_SITE_OTHER): Payer: Self-pay

## 2020-10-29 ENCOUNTER — Encounter (HOSPITAL_COMMUNITY): Payer: Self-pay

## 2020-10-29 ENCOUNTER — Emergency Department (HOSPITAL_COMMUNITY): Payer: Medicaid Other

## 2020-10-29 ENCOUNTER — Other Ambulatory Visit: Payer: Self-pay

## 2020-10-29 DIAGNOSIS — R0602 Shortness of breath: Secondary | ICD-10-CM | POA: Insufficient documentation

## 2020-10-29 DIAGNOSIS — Z5321 Procedure and treatment not carried out due to patient leaving prior to being seen by health care provider: Secondary | ICD-10-CM | POA: Diagnosis not present

## 2020-10-29 NOTE — ED Triage Notes (Addendum)
Pt arrives EMS with c/o shob since before christmas that he suspects related with his chf and lower extremity swelling. Pt sts he is taking all prescribed medications.

## 2020-10-29 NOTE — ED Notes (Signed)
Pt declined having blood work done, stated he wanted to make a phone call.

## 2020-10-30 ENCOUNTER — Emergency Department (HOSPITAL_COMMUNITY)
Admission: EM | Admit: 2020-10-30 | Discharge: 2020-10-30 | Discharge status: Against Medical Advice | Payer: Medicaid Other | Attending: Emergency Medicine | Admitting: Emergency Medicine

## 2020-10-30 DIAGNOSIS — Z5321 Procedure and treatment not carried out due to patient leaving prior to being seen by health care provider: Secondary | ICD-10-CM

## 2020-10-30 LAB — CBC
HCT: 40 % (ref 39.0–52.0)
Hemoglobin: 13 g/dL (ref 13.0–17.0)
MCH: 28 pg (ref 26.0–34.0)
MCHC: 32.5 g/dL (ref 30.0–36.0)
MCV: 86 fL (ref 80.0–100.0)
Platelets: 374 10*3/uL (ref 150–400)
RBC: 4.65 MIL/uL (ref 4.22–5.81)
RDW: 15.2 % (ref 11.5–15.5)
WBC: 6.5 10*3/uL (ref 4.0–10.5)
nRBC: 0 % (ref 0.0–0.2)

## 2020-10-30 LAB — BASIC METABOLIC PANEL
Anion gap: 9 (ref 5–15)
BUN: 35 mg/dL — ABNORMAL HIGH (ref 6–20)
CO2: 27 mmol/L (ref 22–32)
Calcium: 8.9 mg/dL (ref 8.9–10.3)
Chloride: 106 mmol/L (ref 98–111)
Creatinine, Ser: 1.57 mg/dL — ABNORMAL HIGH (ref 0.61–1.24)
GFR, Estimated: 51 mL/min — ABNORMAL LOW (ref >60)
Glucose, Bld: 83 mg/dL (ref 70–99)
Potassium: 3.2 mmol/L — ABNORMAL LOW (ref 3.5–5.1)
Sodium: 142 mmol/L (ref 135–145)

## 2020-10-30 LAB — BRAIN NATRIURETIC PEPTIDE: B Natriuretic Peptide: 2889.3 pg/mL — ABNORMAL HIGH (ref 0.0–100.0)

## 2020-10-30 MED ORDER — FUROSEMIDE 10 MG/ML IJ SOLN
80.0000 mg | Freq: Once | INTRAMUSCULAR | Status: AC
  Administered 2020-10-30: 80 mg via INTRAVENOUS
  Filled 2020-10-30: qty 8

## 2020-10-30 NOTE — ED Notes (Signed)
Patient called out stating "I want my IV out I am leaving". Notified MD and patient walking out of the room in process of taking IV out. Patient refused to sign AMA.

## 2020-10-30 NOTE — ED Notes (Signed)
Patient provided urinal on arrival to room

## 2020-10-30 NOTE — ED Provider Notes (Signed)
[facility]-EMERGENCY DEPT Provider Note   CSN: [id number] Arrival date & time: 10/29/20  2002     History Chief Complaint  Patient presents with  . Shortness of Breath    Dale [name] is a 59 y.o. male.  59 year old male with multiple medical problems who presents the emergency department today secondary to shortness of breath.  Patient states has been take his medications as prescribed but feels well short of breath.  States his lower extremities are swollen.  He states his left groin is still painful from 3 weeks ago is already had a x-ray.  No chest pain.  No fever.  Does have a dry cough.  Does have difficulty laying down at night.   Shortness of Breath      Past Medical History:  Diagnosis Date  . Alcohol abuse   . Chronic combined systolic and diastolic CHF (congestive heart failure) (HCC)    a. 09/27/18 showed mild LVH, EF 20-25%, grade 2 DD, mild MR, severely dilated LA, mildly dilated RV with mildly reduced RV function, mod RAE.  Marland Kitchen Cocaine abuse (HCC)   . Hypertension     Patient Active Problem List   Diagnosis Date Noted  . AKI (acute kidney injury) (HCC) 10/16/2020  . 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 Acute on chronic combined systolic (congestive) and diastolic (congestive) heart failure (HCC)   . Acute CHF (congestive heart failure) (HCC) 05/07/2019  . Nausea & vomiting 05/07/2019  . Acute exacerbation of CHF (congestive heart failure) (HCC) 03/11/2019  . Troponin level elevated 03/11/2019  . Hyperlipidemia 03/03/2019  . NSVT (nonsustained ventricular tachycardia) (HCC) 03/03/2019  . Anxiety and depression   . NSTEMI (non-ST elevated myocardial infarction) (HCC) 02/28/2019  . Acute systolic heart failure (HCC) 11/08/2018  . Hypertensive urgency 09/28/2018  . Acute pulmonary edema (HCC) 09/28/2018  . Cocaine abuse (HCC) 09/28/2018  . Substance abuse (HCC) 09/28/2018  . Chronic  kidney disease, stage II (mild) 09/28/2018  . Normochromic anemia 09/28/2018  . Acute systolic CHF (congestive heart failure) (HCC) 09/27/2018  . Essential hypertension 03/11/2016  . Obesity 03/11/2016  . Glucosuria 03/11/2016    Past Surgical History:  Procedure Laterality Date  . RIGHT/LEFT HEART CATH AND CORONARY ANGIOGRAPHY N/A 05/10/2019   Procedure: RIGHT/LEFT HEART CATH AND CORONARY ANGIOGRAPHY;  Surgeon: Yvonne Kendall, MD;  Location: [facility] INVASIVE CV LAB;  Service: Cardiovascular;  Laterality: N/A;       Family History  Problem Relation Age of Onset  . Hypertension Maternal Grandmother     Social History   Tobacco Use  . Smoking status: Never Smoker  . Smokeless tobacco: Never Used  Vaping Use  . Vaping Use: Never used  Substance Use Topics  . Alcohol use: Yes    Comment: 2 quarts a week   . Drug use: Yes    Frequency: 3.0 times per week    Types: Marijuana, Cocaine    Home Medications Prior to Admission medications   Medication Sig Start Date End Date Taking? Authorizing Provider  aspirin 81 MG EC tablet Take 1 tablet (81 mg total) by mouth daily. 10/21/20 11/20/20  Noralee Stain, DO  atorvastatin (LIPITOR) 40 MG tablet Take 1 tablet (40 mg total) by mouth daily. 10/21/20 11/20/20  Noralee Stain, DO  carvedilol (COREG) 6.25 MG tablet Take 1 tablet (6.25 mg total) by mouth 2 (two) times daily with a meal. 10/21/20 11/20/20  Noralee Stain, DO  diclofenac sodium (VOLTAREN) 1 %  GEL Apply 2 g topically 4 (four) times daily as needed (pain).    [provider]  gabapentin (NEURONTIN) 300 MG capsule Take 1 capsule (300 mg total) by mouth at bedtime. 10/21/20   Noralee Stain, DO  isosorbide-hydrALAZINE (BIDIL) 20-37.5 MG tablet Take 1 tablet by mouth 3 (three) times daily. 10/21/20   Noralee Stain, DO  pantoprazole (PROTONIX) 40 MG tablet Take 1 tablet (40 mg total) by mouth daily. 10/21/20   Noralee Stain, DO  torsemide (DEMADEX) 20 MG tablet Take 1 tablet  (20 mg total) by mouth 2 (two) times daily. 10/21/20   Noralee Stain, DO    Allergies    Hydrocodone  Review of Systems   Review of Systems  Respiratory: Positive for shortness of breath.   All other systems reviewed and are negative.   Physical Exam Updated Vital Signs BP (!) 128/104   Pulse 95   Temp 97.6 F (36.4 C)   Resp 18   Ht 5\' 11"  (1.803 m)   Wt 116 kg   SpO2 95%   BMI 35.67 kg/m   Physical Exam Vitals and nursing note reviewed.  Constitutional:      Appearance: He is well-developed and well-nourished.  HENT:     Head: Normocephalic and atraumatic.  Cardiovascular:     Rate and Rhythm: Normal rate.  Pulmonary:     Effort: Pulmonary effort is normal. No respiratory distress.     Breath sounds: No decreased breath sounds.  Abdominal:     General: There is no distension.  Musculoskeletal:        General: Normal range of motion.     Cervical back: Normal range of motion.     Right lower leg: Edema (mild non-pitting) present.     Left lower leg: No tenderness. No edema.  Skin:    General: Skin is warm and dry.  Neurological:     General: No focal deficit present.     Mental Status: He is alert.     ED Results / Procedures / Treatments   Labs (all labs ordered are listed, but only abnormal results are displayed) Labs Reviewed  BASIC METABOLIC PANEL - Abnormal; Notable for the following components:      Result Value   Potassium 3.2 (*)    BUN 35 (*)    Creatinine, Ser 1.57 (*)    GFR, Estimated 51 (*)    All other components within normal limits  BRAIN NATRIURETIC PEPTIDE - Abnormal; Notable for the following components:   B Natriuretic Peptide 2,889.3 (*)    All other components within normal limits  CBC    EKG None  Radiology DG Chest 2 View  Result Date: 10/29/2020 CLINICAL DATA:  Shortness of breath and lower extremity swelling. EXAM: CHEST - 2 VIEW COMPARISON:  October 15, 2020 FINDINGS: There is no evidence of acute infiltrate,  pleural effusion or pneumothorax. Mild, stable prominence of the pulmonary vasculature is seen. There is moderate to marked severity enlargement of the cardiac silhouette. The visualized skeletal structures are unremarkable. IMPRESSION: Cardiomegaly with mild pulmonary vascular congestion. Electronically Signed   By: October 17, 2020 M.D.   On: 10/29/2020 20:28    Procedures Procedures (including critical care time)  Medications Ordered in ED Medications  furosemide (LASIX) injection 80 mg (80 mg Intravenous Given 10/30/20 0229)    ED Course  I have reviewed the triage vital signs and the nursing notes.  Pertinent labs & imaging results that were available during my care of  the patient were reviewed by me and considered in my medical decision making (see chart for details).    MDM Rules/Calculators/A&P                         Overall patient appears very well.  He is not tachypneic.  He is not hypoxic.  Does have significant volume overload on exam.  His x-ray and his labs are consistent with mild CHF so we will go and give IV dose of Lasix here.  Suspect he will likely be discharged.  When I told him this he asked me if he could please rest here for a day or 2 I discussed with him that unless there was a indication for admission to the hospital that he would likely be discharged. Apparently patient eloped when he could not get the nurse to bring him a [place] sandwich (which was not available). This happened without my discussion of AMA.  Final Clinical Impression(s) / ED Diagnoses Final diagnoses:  Eloped from emergency department    Rx / DC Orders ED Discharge Orders    None       Rhilee Currin, Barbara Cower, MD 11/06/20 2315

## 2020-10-30 NOTE — ED Notes (Signed)
Patient requesting [place] sandwich. Explained to patient there is none in department at this time. Patient states then I would like some more ginger ale.

## 2020-10-30 NOTE — ED Notes (Signed)
Patient provided ginger ale x2. And ice cream x2.

## 2020-11-15 ENCOUNTER — Encounter: Payer: Self-pay | Admitting: Licensed Clinical Social Worker

## 2020-11-15 ENCOUNTER — Telehealth: Payer: Self-pay | Admitting: Licensed Clinical Social Worker

## 2020-11-15 NOTE — Telephone Encounter (Signed)
CSW referred to assist patient with referral for substance abuse treatment program. Patient called triage line requesting assistance. Patient states he has been living in a motel with a friend and "it's not a good environment for me". Patient states he has to leave the motel because the "it's not working out with my friend". Patient reports a 30 year history of cocaine use, alcohol use but denies any tobacco use. He was hospitalized back in December and states he has all his medications and has been taking regularly. He reports continued issue with a pain in his leg that was looked into during the hospitalization. Patient was a no show to both his Cardiology and PCP visit post hospitalization. Patient states he is ready now to stop using cocaine and "get my life together". He admits that his health has been affected by his drug use and lifestyle. When asked why he is living in a motel he shared that he "has burned my bridges with my Mom". His mother is very supportive although will not let him return to her home to live. He states he often has meals with his mom but unable to live there permanently.  CSW contacted multiple Treatment programs in search of bed availability. [facility] has a bed available and willing to explore inpatient program with patient although are requesting a updated cardiac clearance. CSW sent message to Dr Elease Hashimoto to move his follow up appointment set for the end of the month to an earlier date. CSW followed back up with patient to inform of progress of referral and awaiting appointment for cardiac clearance. Patient verbalizes understanding. [name], LCSW, CCSW-MCS [phone]

## 2020-11-16 ENCOUNTER — Ambulatory Visit (INDEPENDENT_AMBULATORY_CARE_PROVIDER_SITE_OTHER): Payer: Medicaid Other | Admitting: Cardiology

## 2020-11-16 ENCOUNTER — Other Ambulatory Visit: Payer: Self-pay

## 2020-11-16 ENCOUNTER — Telehealth: Payer: Self-pay | Admitting: Licensed Clinical Social Worker

## 2020-11-16 ENCOUNTER — Encounter: Payer: Self-pay | Admitting: Cardiology

## 2020-11-16 VITALS — BP 128/64 | HR 94 | Ht 71.0 in | Wt 265.6 lb

## 2020-11-16 DIAGNOSIS — E876 Hypokalemia: Secondary | ICD-10-CM | POA: Diagnosis not present

## 2020-11-16 DIAGNOSIS — N182 Chronic kidney disease, stage 2 (mild): Secondary | ICD-10-CM

## 2020-11-16 DIAGNOSIS — I5042 Chronic combined systolic (congestive) and diastolic (congestive) heart failure: Secondary | ICD-10-CM | POA: Diagnosis not present

## 2020-11-16 DIAGNOSIS — I2510 Atherosclerotic heart disease of native coronary artery without angina pectoris: Secondary | ICD-10-CM

## 2020-11-16 NOTE — Progress Notes (Signed)
Cardiology Office Note:    Date:  11/16/2020   ID:  Dale [name], DOB [birth year], MRN [id number]  PCP:  Grayce Sessions, NP  [facility] Cardiologist:  Kristeen Miss, MD  [facility] Electrophysiologist:  None   Referring MD: Grayce Sessions, NP    History of Present Illness:    Dale [name] is a 60 y.o. 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  Patient has had multiple hospitalizations for volume overload requiring IV diuretics. Last [facility] 05/2019 showed CTA of RCA with L>R collaterals, otherwise non-obstructive CAD which was medically managed. Echo 09/2020 showed EF 20%, mild LVH, moderate to severely reduced RV systolic function, moderate PAH, G3DD. He was previously on aspirin, plavix, metoprolol, bidil, spironolactone, and statin for management of his CHF/CAD, however he has has issues with follow-up and noncompliance.   Today, the patient would like to enroll in a drug treatment program (30 days). He has cut back on his salt and is eating healthier. He has been following a 2L fluid restriction. Taking all of his medications as prescribed. No chest pain, LE edema significantly improved. He continues to have some shortness of breath but overall improved. He has been sober for 2 days and is excited to go to rehab. He is overall doing significantly better from a heart failure standpoint and motivated to continue to improve his health.  Past Medical History:  Diagnosis Date  . Alcohol abuse   . Chronic combined systolic and diastolic CHF (congestive heart failure) (HCC)    a. 09/27/18 showed mild LVH, EF 20-25%, grade 2 DD, mild MR, severely dilated LA, mildly dilated RV with mildly reduced RV function, mod RAE.  Marland Kitchen Cocaine abuse (HCC)   . Hypertension     Past Surgical History:  Procedure Laterality Date  . RIGHT/LEFT HEART CATH AND  CORONARY ANGIOGRAPHY N/A 05/10/2019   Procedure: RIGHT/LEFT HEART CATH AND CORONARY ANGIOGRAPHY;  Surgeon: Yvonne Kendall, MD;  Location: [facility] INVASIVE CV LAB;  Service: Cardiovascular;  Laterality: N/A;    Current Medications: Current Meds  Medication Sig  . aspirin 81 MG EC tablet Take 1 tablet (81 mg total) by mouth daily.  Marland Kitchen atorvastatin (LIPITOR) 40 MG tablet Take 1 tablet (40 mg total) by mouth daily.  . carvedilol (COREG) 6.25 MG tablet Take 1 tablet (6.25 mg total) by mouth 2 (two) times daily with a meal.  . diclofenac sodium (VOLTAREN) 1 % GEL Apply 2 g topically 4 (four) times daily as needed (pain).  Marland Kitchen gabapentin (NEURONTIN) 300 MG capsule Take 1 capsule (300 mg total) by mouth at bedtime.  . isosorbide-hydrALAZINE (BIDIL) 20-37.5 MG tablet Take 1 tablet by mouth 3 (three) times daily.  . pantoprazole (PROTONIX) 40 MG tablet Take 1 tablet (40 mg total) by mouth daily.  Marland Kitchen torsemide (DEMADEX) 20 MG tablet Take 1 tablet (20 mg total) by mouth 2 (two) times daily.     Allergies:   Hydrocodone   Social History   Socioeconomic History  . Marital status: Single    Spouse name: none  . Number of children: Not on file  . Years of education: Not on file  . Highest education level: Not on file  Occupational History  . Occupation: Disabled  Tobacco Use  . Smoking status: Never Smoker  . Smokeless tobacco: Never Used  Vaping Use  . Vaping Use: Never  used  Substance and Sexual Activity  . Alcohol use: Yes    Comment: 2 quarts a week   . Drug use: Yes    Frequency: 3.0 times per week    Types: Marijuana, Cocaine  . Sexual activity: Not Currently  Other Topics Concern  . Not on file  Social History Narrative  . Not on file   Social Determinants of Health   Financial Resource Strain: Medium Risk  . Difficulty of Paying Living Expenses: Somewhat hard  Food Insecurity: Food Insecurity Present  . Worried About Programme researcher, broadcasting/film/video in the Last Year: Sometimes true  . Ran Out  of Food in the Last Year: Sometimes true  Transportation Needs: Unmet Transportation Needs  . Lack of Transportation (Medical): Yes  . Lack of Transportation (Non-Medical): Yes  Physical Activity: Not on file  Stress: Not on file  Social Connections: Not on file     Family History: The patient's family history includes Hypertension in his maternal grandmother.  ROS:   Please see the history of present illness.    Review of Systems  Constitutional: Negative for chills and fever.  HENT: Negative for congestion.   Eyes: Negative for double vision.  Respiratory: Positive for shortness of breath.   Cardiovascular: Positive for orthopnea and leg swelling. Negative for chest pain, palpitations and claudication.  Gastrointestinal: Negative for melena, nausea and vomiting.  Genitourinary: Negative for frequency.  Musculoskeletal: Negative for falls.  Neurological: Negative for dizziness and loss of consciousness.  Psychiatric/Behavioral: Positive for substance abuse.    EKGs/Labs/Other Studies Reviewed:    The following studies were reviewed today: Echocardiogram 09/09/2020: Impressions: 1. Left ventricular ejection fraction, by estimation, is approximately  20%. The left ventricle has severely decreased function. The left  ventricle demonstrates global hypokinesis with regional variation as  noted. The left ventricular internal cavity  size was moderately dilated. Left ventricular diastolic parameters are  consistent with Grade III diastolic dysfunction (restrictive). No obvious  mural thrombus noted with Definity contrast.  2. Right ventricular systolic function is moderately to severely reduced.  The right ventricular size is normal. There is moderately elevated  pulmonary artery systolic pressure. The estimated right ventricular  systolic pressure is 52.0 mmHg.  3. Left atrial size was mild to moderately dilated.  4. Right atrial size was mild to moderately dilated.  5.  The mitral valve is grossly normal. Mild mitral valve regurgitation.  6. The aortic valve is tricuspid. Aortic valve regurgitation is trivial.  7. The inferior vena cava is dilated in size with <50% respiratory  variability, suggesting right atrial pressure of 15 mmHg.  _______________  Lower Extremity Dopplers 10/15/2020: Summary: RIGHT:  - No evidence of common femoral vein obstruction.    LEFT:  - There is no evidence of deep vein thrombosis in the lower extremity.  - No cystic structure found in the popliteal fossa.  RHC/LHC 05/2019: Conclusions: 1. Severe single-vessel coronary artery disease with chronic total occlusion of the proximal RCA. 2. Mild to moderate, non-obstructive coronary artery disease involving the LAD and LCx. 3. Moderately elevated left heart filling pressures. 4. Severely elevated right heart filling pressures. 5. Mild pulmonary hypertension. 6. Normal Fick cardiac output/index.  Recommendations: 1. 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  Patient will be [facility] after recovery from today's procedure. 2. Continue diuresis and optimization of evidence-based heart failure therapy.  I will restart furosemide 40 mg IV BID this afternoon. 3. Secondary prevention of coronary artery  disease, including high-intensity statin therapy and avoidance of cocaine.  EKG:  EKG 10/15/20: NSR with LAE  Recent Labs: 10/16/2020: ALT 16 10/17/2020: Magnesium 2.1 10/29/2020: B Natriuretic Peptide 2,889.3; BUN 35; Creatinine, Ser 1.57; Hemoglobin 13.0; Platelets 374; Potassium 3.2; Sodium 142  Recent Lipid Panel    Component Value Date/Time   CHOL 164 09/09/2020 0710   TRIG 38 09/09/2020 0710   HDL 45 09/09/2020 0710   CHOLHDL 3.6 09/09/2020 0710   VLDL 8 09/09/2020 0710   LDLCALC 111 (H) 09/09/2020 0710       Physical Exam:    VS:  BP 128/64   Pulse 94   Ht 5\' 11"  (1.803 m)   Wt 265 lb 9.6 oz  (120.5 kg)   SpO2 95%   BMI 37.04 kg/m     Wt Readings from Last 3 Encounters:  11/16/20 265 lb 9.6 oz (120.5 kg)  10/29/20 255 lb 11.7 oz (116 kg)  10/21/20 253 lb 9.6 oz (115 kg)     GEN:  Comfortable, NAD HEENT: Normal NECK: No JVD; No carotid bruits CARDIAC: RRR, no murmurs, rubs, gallops RESPIRATORY:  Clear to auscultation without rales, wheezing or rhonchi  ABDOMEN: Soft, non-tender, non-distended MUSCULOSKELETAL:  Trace pedal edema SKIN: Warm and dry NEUROLOGIC:  Alert and oriented x 3 PSYCHIATRIC:  Normal affect   ASSESSMENT:    1. Hypokalemia   2. Chronic combined systolic and diastolic heart failure (HCC)   3. Coronary artery disease involving native coronary artery of native heart without angina pectoris   4. Chronic kidney disease, stage II (mild)    PLAN:    In order of problems listed above:  #Chronic combined systolic and diastolic heart failure: Last EF 20% with grade III diastolic dysfunction. Has multiple admissions for acute volume overload in the setting of cocaine abuse and medication non-compliance. Currently appears much more compensated on exam with only trace LE edema. He has been compliant with all his medications, diet and 2L fluid restriction. He is very motivated to enroll in a rehab program for his drug abuse and is excited to get started -Continue Coreg twice daily -Continue BiDil TID -Continue twice daily torsemide -Check BMET today and add K if needed -Medically cleared to go to drug rehab as patient is overall compensated from a HF standpoint. He will benefit from significantly from this program and eager to participate.  #CAD12/20/21 05/2019 showed CTA of RCA with L>R collaterals, otherwise non-obstructive CAD which was medically managed. No anginal symptoms. -Continue coreg, bidil as above -Continue ASA and statin  #CKD stage 3a: -Repeat BMET today -Continue torsemide    Patient is medically cleared to go to rehab. He is very  motivated to participate in the program and to stop using illicit substances. His heart failure is well controlled on his current medications and the patient has been compliant with salt and fluid restriction.  He will benefit greatly from this program and is anxious to get started.  Medication Adjustments/Labs and Tests Ordered: Current medicines are reviewed at length with the patient today.  Concerns regarding medicines are outlined above.  Orders Placed This Encounter  Procedures  . Basic metabolic panel   No orders of the defined types were placed in this encounter.   Patient Instructions  Medication Instructions:  Your physician recommends that you continue on your current medications as directed. Please refer to the Current Medication list given to you today.  *If you need a refill on your cardiac medications before your  next appointment, please call your pharmacy*   Lab Work: BMET today  If you have labs (blood work) drawn today and your tests are completely normal, you will receive your results only by: Marland Kitchen MyChart Message (if you have MyChart) OR . A paper copy in the mail If you have any lab test that is abnormal or we need to change your treatment, we will call you to review the results.   Testing/Procedures: None   Follow-Up: At [facility], you and your health needs are our priority.  As part of our continuing mission to provide you with exceptional heart care, we have created designated Provider Care Teams.  These Care Teams include your primary Cardiologist (physician) and Advanced Practice Providers (APPs -  Physician Assistants and Nurse Practitioners) who all work together to provide you with the care you need, when you need it.  We recommend signing up for the patient portal called "MyChart".  Sign up information is provided on this After Visit Summary.  MyChart is used to connect with patients for Virtual Visits (Telemedicine).  Patients are able to view  lab/test results, encounter notes, upcoming appointments, etc.  Non-urgent messages can be sent to your provider as well.   To learn more about what you can do with MyChart, go to [email or web address].    Your next appointment:   2 month(s)  The format for your next appointment:   In Person  Provider:   You may see Kristeen Miss, MD or one of the following Advanced Practice Providers on your designated Care Team:    Tereso Newcomer, PA-C  Chelsea Aus, [place]    Other Instructions      Signed, Meriam Sprague, MD  11/16/2020 4:44 PM    [facility]

## 2020-11-16 NOTE — Patient Instructions (Signed)
Medication Instructions:  Your physician recommends that you continue on your current medications as directed. Please refer to the Current Medication list given to you today.  *If you need a refill on your cardiac medications before your next appointment, please call your pharmacy*   Lab Work: BMET today  If you have labs (blood work) drawn today and your tests are completely normal, you will receive your results only by: Marland Kitchen MyChart Message (if you have MyChart) OR . A paper copy in the mail If you have any lab test that is abnormal or we need to change your treatment, we will call you to review the results.   Testing/Procedures: None   Follow-Up: At [facility], you and your health needs are our priority.  As part of our continuing mission to provide you with exceptional heart care, we have created designated Provider Care Teams.  These Care Teams include your primary Cardiologist (physician) and Advanced Practice Providers (APPs -  Physician Assistants and Nurse Practitioners) who all work together to provide you with the care you need, when you need it.  We recommend signing up for the patient portal called "MyChart".  Sign up information is provided on this After Visit Summary.  MyChart is used to connect with patients for Virtual Visits (Telemedicine).  Patients are able to view lab/test results, encounter notes, upcoming appointments, etc.  Non-urgent messages can be sent to your provider as well.   To learn more about what you can do with MyChart, go to [email or web address].    Your next appointment:   2 month(s)  The format for your next appointment:   In Person  Provider:   You may see Kristeen Miss, MD or one of the following Advanced Practice Providers on your designated Care Team:    Tereso Newcomer, PA-C  Chelsea Aus, [place]    Other Instructions

## 2020-11-16 NOTE — Telephone Encounter (Signed)
CSW spoke with Dale [name] at the [facility] office to have patient's appointment moved up to allow for cardiac clearance for admission to [facility]. CSW spoke with patient's mother as well as patient to inform of the importance of making today's appointment. CSW arranged for transportation to and from appointment. CSW will await clearance post appointment this afternoon scheduled for 3pm. Patient verbalizes understanding of follow up and adherence to plan. [name], LCSW, CCSW-MCS [phone]

## 2020-11-16 NOTE — Telephone Encounter (Signed)
CSW followed up with [facility] with cardiac clearance for treatment. Patient approved for admission tomorrow morning at 7:45am. CSW contacted patient to inform and set up transport to facility for admission. Patient sounded motivated for recovery and all set for admission. CSW available as needed. [name], LCSW, CCSW-MCS [phone]

## 2020-11-17 ENCOUNTER — Telehealth: Payer: Self-pay | Admitting: Licensed Clinical Social Worker

## 2020-11-17 LAB — BASIC METABOLIC PANEL
BUN/Creatinine Ratio: 20 (ref 9–20)
BUN: 30 mg/dL — ABNORMAL HIGH (ref 6–24)
CO2: 25 mmol/L (ref 20–29)
Calcium: 8.7 mg/dL (ref 8.7–10.2)
Chloride: 101 mmol/L (ref 96–106)
Creatinine, Ser: 1.51 mg/dL — ABNORMAL HIGH (ref 0.76–1.27)
GFR calc Af Amer: 58 mL/min/{1.73_m2} — ABNORMAL LOW (ref >59)
GFR calc non Af Amer: 51 mL/min/{1.73_m2} — ABNORMAL LOW (ref >59)
Glucose: 88 mg/dL (ref 65–99)
Potassium: 3.4 mmol/L — ABNORMAL LOW (ref 3.5–5.2)
Sodium: 140 mmol/L (ref 134–144)

## 2020-11-17 NOTE — Telephone Encounter (Signed)
CSW received call from [facility] stating that due to funding from [place] they can only accept residents of [place] and because patient's Medicaid originated in [place] he is not eligible for services. CSW spoke with patient who is devastated as he was motivated for treatment and excited about this opportunity. CSW will explore other options and return call to patient. [name], LCSW, CCSW-MCS [phone]

## 2020-11-17 NOTE — Telephone Encounter (Signed)
CSW contacted multiple inpatient facilities to explore treatment bed availability. CSW informed that either they don't accept medicaid or no beds available. CSW discussed further with patient who will follow up with medicaid caseworker about transfer of his case to [facility]. Patient verbalizes understanding as only possible option will be at [email or web address].CSW available as needed. [name], LCSW, CCSW-MCS [phone]

## 2020-11-20 ENCOUNTER — Emergency Department (HOSPITAL_COMMUNITY): Payer: Medicaid Other

## 2020-11-20 ENCOUNTER — Encounter (HOSPITAL_COMMUNITY): Payer: Self-pay | Admitting: Emergency Medicine

## 2020-11-20 ENCOUNTER — Other Ambulatory Visit: Payer: Self-pay

## 2020-11-20 ENCOUNTER — Emergency Department (HOSPITAL_COMMUNITY)
Admission: EM | Admit: 2020-11-20 | Discharge: 2020-11-20 | Disposition: A | Discharge status: Home / Self Care | Payer: Medicaid Other | Attending: Emergency Medicine | Admitting: Emergency Medicine

## 2020-11-20 ENCOUNTER — Telehealth: Payer: Self-pay | Admitting: Licensed Clinical Social Worker

## 2020-11-20 DIAGNOSIS — I5023 Acute on chronic systolic (congestive) heart failure: Secondary | ICD-10-CM | POA: Diagnosis not present

## 2020-11-20 DIAGNOSIS — I509 Heart failure, unspecified: Secondary | ICD-10-CM

## 2020-11-20 DIAGNOSIS — I130 Hypertensive heart and chronic kidney disease with heart failure and stage 1 through stage 4 chronic kidney disease, or unspecified chronic kidney disease: Secondary | ICD-10-CM | POA: Insufficient documentation

## 2020-11-20 DIAGNOSIS — R0602 Shortness of breath: Secondary | ICD-10-CM | POA: Diagnosis present

## 2020-11-20 DIAGNOSIS — N182 Chronic kidney disease, stage 2 (mild): Secondary | ICD-10-CM | POA: Insufficient documentation

## 2020-11-20 DIAGNOSIS — Z955 Presence of coronary angioplasty implant and graft: Secondary | ICD-10-CM | POA: Diagnosis not present

## 2020-11-20 DIAGNOSIS — Z7982 Long term (current) use of aspirin: Secondary | ICD-10-CM | POA: Insufficient documentation

## 2020-11-20 DIAGNOSIS — Z79899 Other long term (current) drug therapy: Secondary | ICD-10-CM | POA: Diagnosis not present

## 2020-11-20 LAB — BASIC METABOLIC PANEL
Anion gap: 12 (ref 5–15)
BUN: 23 mg/dL — ABNORMAL HIGH (ref 6–20)
CO2: 23 mmol/L (ref 22–32)
Calcium: 8.4 mg/dL — ABNORMAL LOW (ref 8.9–10.3)
Chloride: 104 mmol/L (ref 98–111)
Creatinine, Ser: 1.58 mg/dL — ABNORMAL HIGH (ref 0.61–1.24)
GFR, Estimated: 51 mL/min — ABNORMAL LOW (ref >60)
Glucose, Bld: 149 mg/dL — ABNORMAL HIGH (ref 70–99)
Potassium: 3.5 mmol/L (ref 3.5–5.1)
Sodium: 139 mmol/L (ref 135–145)

## 2020-11-20 LAB — CBC
HCT: 33 % — ABNORMAL LOW (ref 39.0–52.0)
Hemoglobin: 11 g/dL — ABNORMAL LOW (ref 13.0–17.0)
MCH: 27.3 pg (ref 26.0–34.0)
MCHC: 33.3 g/dL (ref 30.0–36.0)
MCV: 81.9 fL (ref 80.0–100.0)
Platelets: 281 10*3/uL (ref 150–400)
RBC: 4.03 MIL/uL — ABNORMAL LOW (ref 4.22–5.81)
RDW: 15.9 % — ABNORMAL HIGH (ref 11.5–15.5)
WBC: 6.2 10*3/uL (ref 4.0–10.5)
nRBC: 0 % (ref 0.0–0.2)

## 2020-11-20 LAB — BRAIN NATRIURETIC PEPTIDE: B Natriuretic Peptide: 2321.1 pg/mL — ABNORMAL HIGH (ref 0.0–100.0)

## 2020-11-20 MED ORDER — FUROSEMIDE 10 MG/ML IJ SOLN
40.0000 mg | Freq: Once | INTRAMUSCULAR | Status: AC
  Administered 2020-11-20: 40 mg via INTRAVENOUS
  Filled 2020-11-20: qty 4

## 2020-11-20 MED ORDER — FUROSEMIDE 10 MG/ML IJ SOLN
80.0000 mg | Freq: Once | INTRAMUSCULAR | Status: AC
  Administered 2020-11-20: 80 mg via INTRAVENOUS
  Filled 2020-11-20: qty 8

## 2020-11-20 NOTE — ED Notes (Signed)
Security called to bedside. 

## 2020-11-20 NOTE — ED Notes (Signed)
Lunch Tray Ordered @ 1109. °

## 2020-11-20 NOTE — ED Notes (Addendum)
Patient at this time refused Lasix, stated "I am not feeling that Lasix, right now!" and pushed the table off towards staff. Patient stated "this is bullshit, its been hours and you're just now checking on me about my lunch". Attempted to explain delays to patient. Patient continued to curse at staff. Stated "I need the doctor in here, right now" Informed will notify EDP.

## 2020-11-20 NOTE — Discharge Instructions (Addendum)
Call CSW Annice Pih [phone] on Tuesday 11/21/2020. She will arrange a free cellphone and a ride for you to come to [facility] to pick it up.  Stay in contact with Annice Pih in order to continue to work on gettiing into [facility] on February 1.

## 2020-11-20 NOTE — ED Triage Notes (Signed)
Patient arrives via EMS complaining of shortness of breath.  Patient with CHF history and lower right leg edema.  BS are clear and equal at this time.  Patient compliant with meds.

## 2020-11-20 NOTE — ED Notes (Addendum)
Pt called out and this tech went into room to ask what we could help him with. Pt states "where is my lunch tray?" I notified pt that the tray was ordered and should be on the way. I notified pt that we could get him a [place] sandwich at this time while he waits. Pt "who can I talk to about this because this is unacceptable" I notified him that we had already called but I could find the number for him. Pt states as he is sitting up on the side of the bed "Let me talk to the doctor and get the fuck out of my room". This tech told the pt while stepping out of room that we do not allow people to speak to [place] like this and I will notify the doctor and security. Pt continues to stand up on side of bed and slings everything on the bedside table at this tech and states "get the fuck out of my room now"

## 2020-11-20 NOTE — ED Notes (Signed)
Lunch Tray Ordered @ 1207.

## 2020-11-20 NOTE — ED Provider Notes (Addendum)
[facility] EMERGENCY DEPARTMENT Provider Note   CSN: [id number] Arrival date & time: 11/20/20  0144     History Chief Complaint  Patient presents with  . Shortness of Breath    Dale [name] is a 60 y.o. male.  HPI Patient presents with shortness of breath.  He has had for the last 2 to 3 days.  History of recurrent CHF episodes.  States has been compliant with his medications at home but states he has never needed his diuretics at home as he does when he is in the hospital.  No fevers.  Occasional nonproductive cough.  No chest pain. Records patient has been eating better and reportedly had been attempting to get into substance abuse treatment.  As of a cardiology visit on the 14th had been doing better however around 2 days ago he did have an episode where he used some cocaine.  States he has swelling in both of his legs.  States he has a bed at [facility] that he can go to after he is done here.    Past Medical History:  Diagnosis Date  . Alcohol abuse   . Chronic combined systolic and diastolic CHF (congestive heart failure) (HCC)    a. 09/27/18 showed mild LVH, EF 20-25%, grade 2 DD, mild MR, severely dilated LA, mildly dilated RV with mildly reduced RV function, mod RAE.  Marland Kitchen Cocaine abuse (HCC)   . Hypertension     Patient Active Problem List   Diagnosis Date Noted  . AKI (acute kidney injury) (HCC) 10/16/2020  . 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 Acute on chronic combined systolic (congestive) and diastolic (congestive) heart failure (HCC)   . Acute CHF (congestive heart failure) (HCC) 05/07/2019  . Nausea & vomiting 05/07/2019  . Acute exacerbation of CHF (congestive heart failure) (HCC) 03/11/2019  . Troponin level elevated 03/11/2019  . Hyperlipidemia 03/03/2019  . NSVT (nonsustained ventricular tachycardia) (HCC) 03/03/2019  . Anxiety and depression   . NSTEMI (non-ST elevated myocardial  infarction) (HCC) 02/28/2019  . Acute systolic heart failure (HCC) 11/08/2018  . Hypertensive urgency 09/28/2018  . Acute pulmonary edema (HCC) 09/28/2018  . Cocaine abuse (HCC) 09/28/2018  . Substance abuse (HCC) 09/28/2018  . Chronic kidney disease, stage II (mild) 09/28/2018  . Normochromic anemia 09/28/2018  . Acute systolic CHF (congestive heart failure) (HCC) 09/27/2018  . Essential hypertension 03/11/2016  . Obesity 03/11/2016  . Glucosuria 03/11/2016    Past Surgical History:  Procedure Laterality Date  . RIGHT/LEFT HEART CATH AND CORONARY ANGIOGRAPHY N/A 05/10/2019   Procedure: RIGHT/LEFT HEART CATH AND CORONARY ANGIOGRAPHY;  Surgeon: Yvonne Kendall, MD;  Location: [facility] INVASIVE CV LAB;  Service: Cardiovascular;  Laterality: N/A;       Family History  Problem Relation Age of Onset  . Hypertension Maternal Grandmother     Social History   Tobacco Use  . Smoking status: Never Smoker  . Smokeless tobacco: Never Used  Vaping Use  . Vaping Use: Never used  Substance Use Topics  . Alcohol use: Yes    Comment: 2 quarts a week   . Drug use: Yes    Frequency: 3.0 times per week    Types: Marijuana, Cocaine    Home Medications Prior to Admission medications   Medication Sig Start Date End Date Taking? Authorizing Provider  aspirin 81 MG EC tablet Take 1 tablet (81 mg total) by mouth daily. 10/21/20 11/20/20 Yes Noralee Stain, DO  atorvastatin (LIPITOR) 40 MG tablet Take 1 tablet (40 mg total) by mouth daily. 10/21/20 11/20/20 Yes Noralee Stain, DO  carvedilol (COREG) 6.25 MG tablet Take 1 tablet (6.25 mg total) by mouth 2 (two) times daily with a meal. 10/21/20 11/20/20 Yes Noralee Stain, DO  diclofenac sodium (VOLTAREN) 1 % GEL Apply 2 g topically 4 (four) times daily as needed (pain).   Yes [provider]  gabapentin (NEURONTIN) 300 MG capsule Take 1 capsule (300 mg total) by mouth at bedtime. 10/21/20  Yes Noralee Stain, DO  isosorbide-hydrALAZINE (BIDIL)  20-37.5 MG tablet Take 1 tablet by mouth 3 (three) times daily. 10/21/20  Yes Noralee Stain, DO  torsemide (DEMADEX) 20 MG tablet Take 1 tablet (20 mg total) by mouth 2 (two) times daily. 10/21/20  Yes Noralee Stain, DO  pantoprazole (PROTONIX) 40 MG tablet Take 1 tablet (40 mg total) by mouth daily. 10/21/20   Noralee Stain, DO    Allergies    Hydrocodone  Review of Systems   Review of Systems  Constitutional: Negative for appetite change and fatigue.  HENT: Negative for congestion.   Respiratory: Positive for cough and shortness of breath.   Cardiovascular: Positive for leg swelling. Negative for chest pain.  Gastrointestinal: Negative for abdominal pain and blood in stool.  Genitourinary: Negative for flank pain.  Musculoskeletal: Negative for back pain.  Skin: Negative for rash.  Neurological: Negative for weakness.  Psychiatric/Behavioral: Negative for confusion.    Physical Exam Updated Vital Signs BP (!) 147/106   Pulse 81   Temp 98.5 F (36.9 C) (Oral)   Resp 12   Ht 5\' 11"  (1.803 m)   Wt 124.7 kg   SpO2 98%   BMI 38.35 kg/m   Physical Exam Vitals and nursing note reviewed.  HENT:     Head: Atraumatic.  Eyes:     Pupils: Pupils are equal, round, and reactive to light.  Cardiovascular:     Rate and Rhythm: Normal rate and regular rhythm.  Pulmonary:     Effort: Tachypnea present.     Breath sounds: No wheezing, rhonchi or rales.  Chest:     Chest wall: No tenderness.  Abdominal:     Tenderness: There is no abdominal tenderness.  Musculoskeletal:     Right lower leg: Edema present.     Left lower leg: Edema present.     Comments: Pitting edema bilateral lower extremities.  Skin:    Capillary Refill: Capillary refill takes less than 2 seconds.  Neurological:     Mental Status: He is alert and oriented to person, place, and time.     ED Results / Procedures / Treatments   Labs (all labs ordered are listed, but only abnormal results are  displayed) Labs Reviewed  BASIC METABOLIC PANEL - Abnormal; Notable for the following components:      Result Value   Glucose, Bld 149 (*)    BUN 23 (*)    Creatinine, Ser 1.58 (*)    Calcium 8.4 (*)    GFR, Estimated 51 (*)    All other components within normal limits  CBC - Abnormal; Notable for the following components:   RBC 4.03 (*)    Hemoglobin 11.0 (*)    HCT 33.0 (*)    RDW 15.9 (*)    All other components within normal limits  BRAIN NATRIURETIC PEPTIDE - Abnormal; Notable for the following components:   B Natriuretic Peptide 2,321.1 (*)    All other components within normal limits  EKG EKG Interpretation  Date/Time:  Monday November 20 2020 02:13:41 EST Ventricular Rate:  91 PR Interval:  236 QRS Duration: 100 QT Interval:  408 QTC Calculation: 501 R Axis:   91 Text Interpretation: Sinus rhythm with 1st degree A-V block Rightward axis T wave abnormality, consider inferolateral ischemia Prolonged QT Abnormal ECG No significant change since last tracing Confirmed by Benjiman Core [phone]) on 11/20/2020 9:23:55 AM   Radiology DG Chest 2 View  Result Date: 11/20/2020 CLINICAL DATA:  Shortness of breath EXAM: CHEST - 2 VIEW COMPARISON:  October 29, 2020 FINDINGS: The heart size and mediastinal contours are mildly enlarged. There is prominence of the central pulmonary vasculature. No large airspace consolidation or pleural effusion. The visualized skeletal structures are unremarkable. IMPRESSION: Mild cardiomegaly and pulmonary vascular congestion. Electronically Signed   By: Jonna Clark M.D.   On: 11/20/2020 02:36    Procedures Procedures (including critical care time)  Medications Ordered in ED Medications  furosemide (LASIX) injection 80 mg (80 mg Intravenous Given 11/20/20 1000)  furosemide (LASIX) injection 40 mg (40 mg Intravenous Given 11/20/20 1535)    ED Course  I have reviewed the triage vital signs and the nursing notes.  Pertinent labs & imaging  results that were available during my care of the patient were reviewed by me and considered in my medical decision making (see chart for details).    MDM Rules/Calculators/A&P                         Patient's weight is up around 4 kg since 3 days ago.  Patient presents with shortness of breath.  History of CHF.  Weight is up 4 kg over the last 3 days.  X-ray shows some volume overload and BNP is elevated.  Has had some diuresis with eighty of Lasix.  It is slow down we will try another 40 mg.  Not hypoxic.  However does not do well at home.  Patient is asking for social work consult.  Attempting to get placed at subs abuse treatment and has placement at [facility] but in 2 weeks.  Hopefully patient should be able to be discharged.  However attempting more diuresis.  Potentially even heart failure at home.  Care turned over to Dr. Clayborne Dana.  Patient became more agitated that he had not had his lunch.  Reportedly pushed the table towards nurse.  He is on his medicine.  States he will act better.  Final Clinical Impression(s) / ED Diagnoses Final diagnoses:  Acute on chronic congestive heart failure, unspecified heart failure type [facility])    Rx / DC Orders ED Discharge Orders    None       Benjiman Core, MD 11/20/20 1528    Benjiman Core, MD 11/20/20 1539

## 2020-11-20 NOTE — Telephone Encounter (Signed)
Patient called CSW to inform that he is in the ED due to fluid overload. Patient inquiring about continued search for drug treatment programs. Patient shared that he went to DSS in [facility] on Friday and changed his address to reflect his current living situation in [place]. CSW spoke with June at [facility] on Friday afternoon and she shared that if his medicaid was changed to reflect [place] [facility] that his admission could be reevaluated on the first of February. CSW shared this information with patient and that we have to wait two weeks before to [facility]. Patient was motivated to make it the next two weeks and seek treatment at [facility] as it appears to be the only option for inpatient treatment in the area. Patient states "I will find somewhere to go until then". Patient will contact CSW throughout the next two weeks to maintain contact and get support until admission at [facility]. CSW available as needed. [name], LCSW, CCSW-MCS [phone]

## 2020-11-20 NOTE — Social Work (Signed)
CSW met with Pt at bedside.  CSW provided Pt with telephone # for CSW Tobe Sos to ensure that Pt can call on 1/18 to connect with cell phone program and continue to work with [place].   CSW also added this information to AVS.   Pt was initally annoyed and declined his dinner tray, but asked for return of tray.  This CSW was unable to find nutrition services person with meals as they had left ED.  CSW provided Pt with sack lunch.  CSW updated RN.  CSW will be able to provide transport to [facility] at 8 pm.

## 2020-11-20 NOTE — ED Notes (Signed)
Patient requested "ginger ale" because he does not like water, reinforced teaching of salt intake / fluid retention and cup of ice chips provided instead.

## 2020-11-21 ENCOUNTER — Telehealth: Payer: Self-pay | Admitting: Licensed Clinical Social Worker

## 2020-11-21 NOTE — Telephone Encounter (Signed)
CSW met with patient to provide a cell phone. Patient grateful for the phone and ability to communicate with providers and CSW until admission to [facility] the first of February. Patient reports he is staying at the [facility] at the moment and will keep CSW informed of his location. CSW continues to follow for supportive needs and admission to drug treatment. Raquel Sarna, [place], [place]

## 2020-11-29 ENCOUNTER — Telehealth: Payer: Self-pay | Admitting: Licensed Clinical Social Worker

## 2020-11-29 ENCOUNTER — Telehealth: Payer: Self-pay | Admitting: Cardiology

## 2020-11-29 ENCOUNTER — Telehealth: Payer: Self-pay | Admitting: Nurse Practitioner

## 2020-11-29 NOTE — Telephone Encounter (Signed)
   Pt called this evening to report that he lost his medicines earlier this week @ homeless shelter and was out of everything for three days.  He was able to get refill on carvedilol, bidil, and torsemide yesterday, and he resumed them today. In the absence of his medicines, he noted increasing lower ext edema, DOE, and orthopnea, prompting call to our office earlier today and again this evening.  He notes that since taking torsemide 40mg  x 1 this afternoon, he has seen some improvement in UO and also dyspnea and swelling.  He feels that he's about 90% of the way back to baseline now.  He does not have a scale @ home.  I rec that he cont to make his medications as prescribed and if symptoms worsen, to call back.  In the meantime, I will try to get him an appt to be seen in clinic w/in the next week.  Caller verbalized understanding and was grateful for the call back.  , NP 11/29/2020, 6:27 PM

## 2020-11-29 NOTE — Telephone Encounter (Signed)
See phone encounter from this evening.  Nicolasa Ducking, NP 6:37 PM 11/29/2020

## 2020-11-29 NOTE — Telephone Encounter (Signed)
Pt c/o Shortness Of Breath: STAT if SOB developed within the last 24 hours or pt is noticeably SOB on the phone  1. Are you currently SOB (can you hear that pt is SOB on the phone)? No, occurs when laying down  2. How long have you been experiencing SOB? 3-4 days ago  3. Are you SOB when sitting or when up moving around? Laying down   4. Are you currently experiencing any other symptoms? Dry cough   Clois is calling stating he is having SOB when he lays down that is causing him to not be able to sleep. He states his medication went missing and due to not being able to take it his ankles swelled extremely bad. He states he got a new prescription and the swelling has almost gone completely down, but he is still having SOB that has been occurring for the past 3-4 days.

## 2020-11-29 NOTE — Telephone Encounter (Signed)
CSW received call from patient stating that he can stay with his daughter until final arrangements made for rehab stay after 12-05-2020. Patient asked CSW to contact daughter [name] @ [phone] to confirm address and ability to stay with her. Daughter agreeable for short stay with her. CSW made transport arrangements for patient to come to [facility] to pick up coat and socks ordered for him through the [employer] and then on to his daughters home. CSW continues to follow for support and possible admission to drug rehab program. [name], [place], CCSW-MCS [phone]

## 2020-11-29 NOTE — Telephone Encounter (Signed)
Attempted to call the patient regarding his SOB/swelling. Patient did not answer and unable to leave a message due to VM not being set up yet.

## 2020-11-30 ENCOUNTER — Ambulatory Visit: Payer: Medicaid Other | Admitting: Cardiovascular Disease

## 2020-12-04 ENCOUNTER — Telehealth: Payer: Self-pay | Admitting: Licensed Clinical Social Worker

## 2020-12-04 ENCOUNTER — Ambulatory Visit: Payer: Medicaid Other | Admitting: Cardiovascular Disease

## 2020-12-04 NOTE — Telephone Encounter (Signed)
CSW received call from patient stating he has continued to maintain a drug free life and still interested in going to [facility] for rehab. CSW contacted June at [facility] to discuss admission for 12-05-20. June stated that she can't check Latta Tracks until 12-05-20 to assure that the Medicaid has transferred to [facility]. She will return call to CSW tomorrow with response. CSW informed patient and will follow back up tomorrow. [name], LCSW, CCSW-MCS [phone]

## 2020-12-05 ENCOUNTER — Telehealth: Payer: Self-pay | Admitting: Licensed Clinical Social Worker

## 2020-12-05 NOTE — Telephone Encounter (Signed)
CSW received call from June stating that patient's medicaid was successfully transferred to [facility] and patient is able to be admitted to [facility]. Unfortunately, they have a covid outbreak at the moment so he will remain on the waiting list for a few days until cleared for new admits. CSW attempted to reach out to patient but unable to leave a message. CSW will continue to follow for rehab admission. [name], LCSW, CCSW-MCS [phone]

## 2020-12-08 ENCOUNTER — Telehealth: Payer: Self-pay | Admitting: Licensed Clinical Social Worker

## 2020-12-08 NOTE — Telephone Encounter (Signed)
CSW received call from June at [facility] stating she has a bed available and has been unable to reach patient. CSW contacted patient and instructed him to contact admissions and clarify admit date and time. Patient returned call to say he will be admitted on Monday for inpatient drug treatment. Patient will contact transportation to arrange for Monday. CSW available as needed. [name], LCSW, CCSW-MCS [phone]

## 2020-12-11 ENCOUNTER — Telehealth: Payer: Self-pay | Admitting: Cardiovascular Disease

## 2020-12-11 MED ORDER — POTASSIUM CHLORIDE CRYS ER 20 MEQ PO TBCR: 20.0000 meq | EXTENDED_RELEASE_TABLET | Freq: Every day | ORAL | 3 refills | Status: DC

## 2020-12-11 NOTE — Telephone Encounter (Signed)
Patient states he is returning a call to discuss lab results. 

## 2020-12-11 NOTE — Telephone Encounter (Signed)
Spoke with pt c/o increased SOB and swelling bilaterally from his feet to his knees.  He reports this seems to be getting worse despite taking increased dose of Torsemide 20 mg BID.  He reports not being able to elevate his feet/legs because it makes him too SOB.  Feels as though abdomen maybe swollen as well. SOB with minimal activity.   He does not have a scale at home to weigh but feels like he has put on weight.  He reports eating 2 slices of pizza yesterday but has been limiting his fluid intake to 2 liters or less a day.  Last  EF 20 % on echo 09/2020.  11/20/2020 BUN 23, Crea 1.58 and BNP 2,321.  Advised pt he needs to report to the closest ED for further eval and treatment.  He reports understanding and that he will call EMS to transport him.  Will forward to Dr Elease Hashimoto for his knowledge.

## 2020-12-11 NOTE — Telephone Encounter (Signed)
Pt c/o swelling: STAT is pt has developed SOB within 24 hours  1) How much weight have you gained and in what time span? 10-15 lbs in 2 days  2) If swelling, where is the swelling located? From the knees and down (legs, feet ankles)  3) Are you currently taking a fluid pill? Yes  4) Are you currently SOB? Yes   5) Do you have a log of your daily weights (if so, list)? No log available   6) Have you gained 3 pounds in a day or 5 pounds in a week? Yes. Patient states he gained 10-15 lbs Ibs in 2 days  7) Have you traveled recently? No  Pt c/o Shortness Of Breath: STAT if SOB developed within the last 24 hours or pt is noticeably SOB on the phone  1. Are you currently SOB (can you hear that pt is SOB on the phone)? Yes   2. How long have you been experiencing SOB? about 1 week, per patient  3. Are you SOB when sitting or when up moving around? Both   4. Are you currently experiencing any other symptoms? No

## 2020-12-11 NOTE — Telephone Encounter (Signed)
Meriam Sprague, MD  11/17/2020 10:57 AM EST      His kidney function looks stable but his potassium level is on the lower side. Can we prescribe him potassium daily to ensure it does not drop too low. He will need repeat labs at his next follow-up visit.   The patient has been notified of the result and verbalized understanding.  All questions (if any) were answered. Rx for potassium has been sent in.  Patient was advised earlier today to go to the ER for evaluation. Asked patient why he did not go and he states that he did not want to wait. Patient states that he is feeling better. He has been elevating his legs and states that swelling has improved. Advised patient that if symptoms worsen he needs to report to the ER. Patient verbalized understanding.

## 2020-12-11 NOTE — Telephone Encounter (Signed)
I agree with the note by Avie Arenas, RN. I have not seen Mak in years - he has missed several of his recent appointments with me He was recently seen on Jan. 13, 2022 by Dr. Shari Prows for clearance to enter a  30 day drug rehab program.    I'm curious if he is still enrolled in the program as it has only been 25 days since he was cleared to go .

## 2020-12-12 ENCOUNTER — Emergency Department: Payer: Medicaid Other

## 2020-12-12 ENCOUNTER — Inpatient Hospital Stay
Admission: EM | Admit: 2020-12-12 | Discharge: 2020-12-17 | DRG: 291 | Disposition: A | Discharge status: Home / Self Care | Payer: Medicaid Other | Attending: Internal Medicine | Admitting: Internal Medicine

## 2020-12-12 ENCOUNTER — Other Ambulatory Visit: Payer: Self-pay

## 2020-12-12 DIAGNOSIS — I2510 Atherosclerotic heart disease of native coronary artery without angina pectoris: Secondary | ICD-10-CM | POA: Diagnosis present

## 2020-12-12 DIAGNOSIS — Z6837 Body mass index (BMI) 37.0-37.9, adult: Secondary | ICD-10-CM

## 2020-12-12 DIAGNOSIS — I255 Ischemic cardiomyopathy: Secondary | ICD-10-CM | POA: Diagnosis present

## 2020-12-12 DIAGNOSIS — I5021 Acute systolic (congestive) heart failure: Secondary | ICD-10-CM | POA: Diagnosis present

## 2020-12-12 DIAGNOSIS — E876 Hypokalemia: Secondary | ICD-10-CM | POA: Diagnosis present

## 2020-12-12 DIAGNOSIS — K219 Gastro-esophageal reflux disease without esophagitis: Secondary | ICD-10-CM | POA: Diagnosis present

## 2020-12-12 DIAGNOSIS — Z20822 Contact with and (suspected) exposure to covid-19: Secondary | ICD-10-CM | POA: Diagnosis present

## 2020-12-12 DIAGNOSIS — Z79899 Other long term (current) drug therapy: Secondary | ICD-10-CM

## 2020-12-12 DIAGNOSIS — Z5901 Sheltered homelessness: Secondary | ICD-10-CM

## 2020-12-12 DIAGNOSIS — I5023 Acute on chronic systolic (congestive) heart failure: Secondary | ICD-10-CM

## 2020-12-12 DIAGNOSIS — E785 Hyperlipidemia, unspecified: Secondary | ICD-10-CM | POA: Diagnosis present

## 2020-12-12 DIAGNOSIS — I130 Hypertensive heart and chronic kidney disease with heart failure and stage 1 through stage 4 chronic kidney disease, or unspecified chronic kidney disease: Principal | ICD-10-CM | POA: Diagnosis present

## 2020-12-12 DIAGNOSIS — I5043 Acute on chronic combined systolic (congestive) and diastolic (congestive) heart failure: Secondary | ICD-10-CM

## 2020-12-12 DIAGNOSIS — E669 Obesity, unspecified: Secondary | ICD-10-CM | POA: Diagnosis present

## 2020-12-12 DIAGNOSIS — I272 Pulmonary hypertension, unspecified: Secondary | ICD-10-CM | POA: Diagnosis present

## 2020-12-12 DIAGNOSIS — G629 Polyneuropathy, unspecified: Secondary | ICD-10-CM | POA: Diagnosis present

## 2020-12-12 DIAGNOSIS — I472 Ventricular tachycardia: Secondary | ICD-10-CM | POA: Diagnosis present

## 2020-12-12 DIAGNOSIS — F1410 Cocaine abuse, uncomplicated: Secondary | ICD-10-CM | POA: Diagnosis present

## 2020-12-12 DIAGNOSIS — N182 Chronic kidney disease, stage 2 (mild): Secondary | ICD-10-CM | POA: Diagnosis present

## 2020-12-12 DIAGNOSIS — D631 Anemia in chronic kidney disease: Secondary | ICD-10-CM | POA: Diagnosis present

## 2020-12-12 DIAGNOSIS — I420 Dilated cardiomyopathy: Secondary | ICD-10-CM | POA: Diagnosis present

## 2020-12-12 LAB — CBC WITH DIFFERENTIAL/PLATELET
Abs Immature Granulocytes: 0.02 10*3/uL (ref 0.00–0.07)
Basophils Absolute: 0 10*3/uL (ref 0.0–0.1)
Basophils Relative: 1 %
Eosinophils Absolute: 0.1 10*3/uL (ref 0.0–0.5)
Eosinophils Relative: 2 %
HCT: 32.6 % — ABNORMAL LOW (ref 39.0–52.0)
Hemoglobin: 10.8 g/dL — ABNORMAL LOW (ref 13.0–17.0)
Immature Granulocytes: 0 %
Lymphocytes Relative: 18 %
Lymphs Abs: 1 10*3/uL (ref 0.7–4.0)
MCH: 26.2 pg (ref 26.0–34.0)
MCHC: 33.1 g/dL (ref 30.0–36.0)
MCV: 78.9 fL — ABNORMAL LOW (ref 80.0–100.0)
Monocytes Absolute: 0.8 10*3/uL (ref 0.1–1.0)
Monocytes Relative: 14 %
Neutro Abs: 3.7 10*3/uL (ref 1.7–7.7)
Neutrophils Relative %: 65 %
Platelets: 291 10*3/uL (ref 150–400)
RBC: 4.13 MIL/uL — ABNORMAL LOW (ref 4.22–5.81)
RDW: 17.1 % — ABNORMAL HIGH (ref 11.5–15.5)
WBC: 5.6 10*3/uL (ref 4.0–10.5)
nRBC: 0 % (ref 0.0–0.2)

## 2020-12-12 MED ORDER — FUROSEMIDE 10 MG/ML IJ SOLN
80.0000 mg | Freq: Once | INTRAMUSCULAR | Status: AC
  Administered 2020-12-12: 80 mg via INTRAVENOUS
  Filled 2020-12-12: qty 8

## 2020-12-12 NOTE — ED Provider Notes (Signed)
[facility] Emergency Department Provider Note  ____________________________________________   Event Date/Time   First MD Initiated Contact with Patient 12/12/20 2302     (approximate)   I have reviewed the triage vital signs and the nursing notes.   HISTORY  Chief Complaint Edema    HPI Dale [name] is a 60 y.o. male  With h/o CHF (EF20%), HTN, cocaine abuse, here with SOB. Pt reports approx 20 lb weight gain over the past 1 week. He states he's had increasing LE edema, SOB, orthopnea, and cough. He has felt extremely dyspneic but at rest but worse w/ exertion, and has had difficulty even getting around the house. He's had cough, non productive. No fevers. No chest pain. He has been taking torsemide 20 mg BID without significant relief, feels like it does not result in much urination. No specific alleviating factors other than rest and sitting upright.       Past Medical History:  Diagnosis Date  . Alcohol abuse   . Chronic combined systolic and diastolic CHF (congestive heart failure) (HCC)    a. 09/27/18 showed mild LVH, EF 20-25%, grade 2 DD, mild MR, severely dilated LA, mildly dilated RV with mildly reduced RV function, mod RAE.  Marland Kitchen Cocaine abuse (HCC)   . Hypertension     Patient Active Problem List   Diagnosis Date Noted  . AKI (acute kidney injury) (HCC) 10/16/2020  . 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 Acute on chronic combined systolic (congestive) and diastolic (congestive) heart failure (HCC)   . Acute CHF (congestive heart failure) (HCC) 05/07/2019  . Nausea & vomiting 05/07/2019  . Acute exacerbation of CHF (congestive heart failure) (HCC) 03/11/2019  . Troponin level elevated 03/11/2019  . Hyperlipidemia 03/03/2019  . NSVT (nonsustained ventricular tachycardia) (HCC) 03/03/2019  . Anxiety and depression   . NSTEMI (non-ST elevated myocardial infarction) (HCC) 02/28/2019  . Acute  systolic heart failure (HCC) 11/08/2018  . Hypertensive urgency 09/28/2018  . Acute pulmonary edema (HCC) 09/28/2018  . Cocaine abuse (HCC) 09/28/2018  . Substance abuse (HCC) 09/28/2018  . Chronic kidney disease, stage II (mild) 09/28/2018  . Normochromic anemia 09/28/2018  . Acute systolic CHF (congestive heart failure) (HCC) 09/27/2018  . Essential hypertension 03/11/2016  . Obesity 03/11/2016  . Glucosuria 03/11/2016    Past Surgical History:  Procedure Laterality Date  . RIGHT/LEFT HEART CATH AND CORONARY ANGIOGRAPHY N/A 05/10/2019   Procedure: RIGHT/LEFT HEART CATH AND CORONARY ANGIOGRAPHY;  Surgeon: Yvonne Kendall, MD;  Location: [facility] INVASIVE CV LAB;  Service: Cardiovascular;  Laterality: N/A;    Prior to Admission medications   Medication Sig Start Date End Date Taking? Authorizing Provider  atorvastatin (LIPITOR) 40 MG tablet Take 1 tablet (40 mg total) by mouth daily. 10/21/20 11/20/20  Noralee Stain, DO  carvedilol (COREG) 6.25 MG tablet Take 1 tablet (6.25 mg total) by mouth 2 (two) times daily with a meal. 10/21/20 11/20/20  Noralee Stain, DO  diclofenac sodium (VOLTAREN) 1 % GEL Apply 2 g topically 4 (four) times daily as needed (pain).    [provider]  gabapentin (NEURONTIN) 300 MG capsule Take 1 capsule (300 mg total) by mouth at bedtime. 10/21/20   Noralee Stain, DO  isosorbide-hydrALAZINE (BIDIL) 20-37.5 MG tablet Take 1 tablet by mouth 3 (three) times daily. 10/21/20   Noralee Stain, DO  pantoprazole (PROTONIX) 40 MG tablet Take 1 tablet (40 mg total) by mouth daily. 10/21/20   Noralee Stain,  DO  potassium chloride SA (KLOR-CON) 20 MEQ tablet Take 1 tablet (20 mEq total) by mouth daily. 12/11/20   Nahser, Deloris Ping, MD  torsemide (DEMADEX) 20 MG tablet Take 1 tablet (20 mg total) by mouth 2 (two) times daily. 10/21/20   Noralee Stain, DO    Allergies Hydrocodone  Family History  Problem Relation Age of Onset  . Hypertension Maternal Grandmother      Social History Social History   Tobacco Use  . Smoking status: Never Smoker  . Smokeless tobacco: Never Used  Vaping Use  . Vaping Use: Never used  Substance Use Topics  . Alcohol use: Yes    Comment: 2 quarts a week   . Drug use: Yes    Frequency: 3.0 times per week    Types: Marijuana, Cocaine    Review of Systems  Review of Systems  Constitutional: Positive for fatigue. Negative for chills and fever.  HENT: Negative for sore throat.   Respiratory: Positive for cough and shortness of breath.   Cardiovascular: Positive for leg swelling. Negative for chest pain.  Gastrointestinal: Negative for abdominal pain.  Genitourinary: Negative for flank pain.  Musculoskeletal: Negative for neck pain.  Skin: Negative for rash and wound.  Allergic/Immunologic: Negative for immunocompromised state.  Neurological: Negative for weakness and numbness.  Hematological: Does not bruise/bleed easily.  All other systems reviewed and are negative.    ____________________________________________  PHYSICAL EXAM:      VITAL SIGNS: ED Triage Vitals  Enc Vitals Group     BP 12/12/20 2300 (!) 141/92     Pulse Rate 12/12/20 2300 91     Resp 12/12/20 2300 (!) 24     Temp 12/12/20 2300 (!) 97.3 F (36.3 C)     Temp Source 12/12/20 2300 Oral     SpO2 12/12/20 2300 99 %     Weight --      Height --      Head Circumference --      Peak Flow --      Pain Score 12/12/20 2257 9     Pain Loc --      Pain Edu? --      Excl. in GC? --      Physical Exam Vitals and nursing note reviewed.  Constitutional:      General: He is not in acute distress.    Appearance: He is well-developed and well-nourished.  HENT:     Head: Normocephalic and atraumatic.  Eyes:     Conjunctiva/sclera: Conjunctivae normal.  Cardiovascular:     Rate and Rhythm: Normal rate and regular rhythm.     Heart sounds: Normal heart sounds.  Pulmonary:     Effort: Pulmonary effort is normal. No respiratory distress.      Breath sounds: Rales (mild, bibasilar) present. No wheezing.  Abdominal:     General: There is no distension.  Musculoskeletal:     Cervical back: Neck supple.     Right lower leg: Edema (2+ pitting) present.     Left lower leg: Left lower leg edema: 2+ pitting.  Skin:    General: Skin is warm.     Capillary Refill: Capillary refill takes less than 2 seconds.     Findings: No rash.  Neurological:     Mental Status: He is alert and oriented to person, place, and time.     Motor: No abnormal muscle tone.       ____________________________________________   LABS (all labs ordered are listed, but  only abnormal results are displayed)  Labs Reviewed  CBC WITH DIFFERENTIAL/PLATELET - Abnormal; Notable for the following components:      Result Value   RBC 4.13 (*)    Hemoglobin 10.8 (*)    HCT 32.6 (*)    MCV 78.9 (*)    RDW 17.1 (*)    All other components within normal limits  BRAIN NATRIURETIC PEPTIDE - Abnormal; Notable for the following components:   B Natriuretic Peptide 3,111.3 (*)    All other components within normal limits  COMPREHENSIVE METABOLIC PANEL - Abnormal; Notable for the following components:   Potassium 3.3 (*)    Glucose, Bld 115 (*)    BUN 23 (*)    Creatinine, Ser 1.29 (*)    Calcium 8.3 (*)    Albumin 3.3 (*)    All other components within normal limits  TROPONIN I (HIGH SENSITIVITY) - Abnormal; Notable for the following components:   Troponin I (High Sensitivity) 30 (*)    All other components within normal limits  TROPONIN I (HIGH SENSITIVITY) - Abnormal; Notable for the following components:   Troponin I (High Sensitivity) 29 (*)    All other components within normal limits  RESP PANEL BY RT-PCR (FLU A&B, COVID) ARPGX2  BASIC METABOLIC PANEL  CBC    ____________________________________________  EKG: Normal sinus rhythm, VR 94. QRS 110, QTc 595. Prolonged QT. No acute St elevation. Nonspecific TWC as noted  previously. ________________________________________  RADIOLOGY All imaging, including plain films, CT scans, and ultrasounds, independently reviewed by me, and interpretations confirmed via formal radiology reads.  ED MD interpretation:   CXR: Marked cardiomegaly, no acute airspace disease  Official radiology report(s): DG Chest 2 View  Result Date: 12/12/2020 CLINICAL DATA:  Short of breath, bilateral lower extremity edema EXAM: CHEST - 2 VIEW COMPARISON:  11/20/2020 FINDINGS: Frontal and lateral views of the chest demonstrate stable enlarged cardiac silhouette. No airspace disease, effusion, or pneumothorax. No acute bony abnormalities. IMPRESSION: 1. Stable enlarged cardiac silhouette.  No acute airspace disease. Electronically Signed   By: Sharlet Salina M.D.   On: 12/12/2020 23:20    ____________________________________________  PROCEDURES   Procedure(s) performed (including Critical Care):  .1-3 Lead EKG Interpretation Performed by: Shaune Pollack, MD Authorized by: Shaune Pollack, MD     Interpretation: normal     ECG rate:  90-100   ECG rate assessment: normal     Rhythm: sinus rhythm     Ectopy: none     Conduction: normal   Comments:     Indication: CHF    ____________________________________________  INITIAL IMPRESSION / MDM / ASSESSMENT AND PLAN / ED COURSE  As part of my medical decision making, I reviewed the following data within the electronic MEDICAL RECORD NUMBER Nursing notes reviewed and incorporated, Old chart reviewed, Notes from prior ED visits, and [place] Controlled Substance Database       *Kempton Milne was evaluated in Emergency Department on 12/13/2020 for the symptoms described in the history of present illness. He was evaluated in the context of the global COVID-19 pandemic, which necessitated consideration that the patient might be at risk for infection with the SARS-CoV-2 virus that causes COVID-19. Institutional protocols and algorithms that  pertain to the evaluation of patients at risk for COVID-19 are in a state of rapid change based on information released by regulatory bodies including the CDC and federal and state organizations. These policies and algorithms were followed during the patient's care in the ED.  Some ED  evaluations and interventions may be delayed as a result of limited staffing during the pandemic.*     Medical Decision Making:  60 yo M here with bilateral LE edema, SOB.  Patient in mild respiratory stress due to tachypnea on arrival, but he is not hypoxic.  Lab work shows baseline mild CKD.  Troponin 29 and BNP over 3000 likely due to his acute CHF exacerbation.  Chest x-ray shows cardiomegaly without acute abnormality.  Patient given IV Lasix with good effect.  However, he has greater than 20 pounds of fluid on him and marked tachypnea with any amount of exertion, feel he would benefit from inpatient diuresis.  Patient updated and is in agreement.  ____________________________________________  FINAL CLINICAL IMPRESSION(S) / ED DIAGNOSES  Final diagnoses:  Acute on chronic systolic congestive heart failure (HCC)     MEDICATIONS GIVEN DURING THIS VISIT:  Medications  atorvastatin (LIPITOR) tablet 40 mg (has no administration in time range)  carvedilol (COREG) tablet 6.25 mg (has no administration in time range)  isosorbide-hydrALAZINE (BIDIL) 20-37.5 MG per tablet 1 tablet (has no administration in time range)  pantoprazole (PROTONIX) EC tablet 40 mg (has no administration in time range)  gabapentin (NEURONTIN) capsule 300 mg (has no administration in time range)  potassium chloride SA (KLOR-CON) CR tablet 20 mEq (has no administration in time range)  furosemide (LASIX) injection 80 mg (has no administration in time range)  enoxaparin (LOVENOX) injection 40 mg (has no administration in time range)  lisinopril (ZESTRIL) tablet 5 mg (has no administration in time range)  acetaminophen (TYLENOL) tablet 650 mg  (has no administration in time range)    Or  acetaminophen (TYLENOL) suppository 650 mg (has no administration in time range)  traZODone (DESYREL) tablet 25 mg (has no administration in time range)  magnesium hydroxide (MILK OF MAGNESIA) suspension 30 mL (has no administration in time range)  ondansetron (ZOFRAN) tablet 4 mg (has no administration in time range)    Or  ondansetron (ZOFRAN) injection 4 mg (has no administration in time range)  aspirin EC tablet 81 mg (has no administration in time range)  furosemide (LASIX) injection 80 mg (80 mg Intravenous Given 12/12/20 2349)  potassium chloride SA (KLOR-CON) CR tablet 40 mEq (40 mEq Oral Given 12/13/20 0117)     ED Discharge Orders    None       Note:  This document was prepared using Dragon voice recognition software and may include unintentional dictation errors.   Shaune Pollack, MD 12/13/20 0300

## 2020-12-12 NOTE — ED Triage Notes (Signed)
Pt from home. Hx CHF, past 3 days worsening edema in BLE. Became SOB today.

## 2020-12-13 ENCOUNTER — Telehealth: Payer: Self-pay | Admitting: Licensed Clinical Social Worker

## 2020-12-13 ENCOUNTER — Encounter: Payer: Self-pay | Admitting: Family Medicine

## 2020-12-13 DIAGNOSIS — N182 Chronic kidney disease, stage 2 (mild): Secondary | ICD-10-CM | POA: Diagnosis present

## 2020-12-13 DIAGNOSIS — E669 Obesity, unspecified: Secondary | ICD-10-CM | POA: Diagnosis present

## 2020-12-13 DIAGNOSIS — E876 Hypokalemia: Secondary | ICD-10-CM | POA: Diagnosis present

## 2020-12-13 DIAGNOSIS — R9431 Abnormal electrocardiogram [ECG] [EKG]: Secondary | ICD-10-CM | POA: Diagnosis not present

## 2020-12-13 DIAGNOSIS — I472 Ventricular tachycardia: Secondary | ICD-10-CM | POA: Diagnosis present

## 2020-12-13 DIAGNOSIS — I130 Hypertensive heart and chronic kidney disease with heart failure and stage 1 through stage 4 chronic kidney disease, or unspecified chronic kidney disease: Secondary | ICD-10-CM | POA: Diagnosis present

## 2020-12-13 DIAGNOSIS — I10 Essential (primary) hypertension: Secondary | ICD-10-CM | POA: Diagnosis not present

## 2020-12-13 DIAGNOSIS — I509 Heart failure, unspecified: Secondary | ICD-10-CM | POA: Diagnosis present

## 2020-12-13 DIAGNOSIS — E785 Hyperlipidemia, unspecified: Secondary | ICD-10-CM | POA: Diagnosis present

## 2020-12-13 DIAGNOSIS — D631 Anemia in chronic kidney disease: Secondary | ICD-10-CM | POA: Diagnosis present

## 2020-12-13 DIAGNOSIS — R601 Generalized edema: Secondary | ICD-10-CM

## 2020-12-13 DIAGNOSIS — Z20822 Contact with and (suspected) exposure to covid-19: Secondary | ICD-10-CM | POA: Diagnosis present

## 2020-12-13 DIAGNOSIS — I5021 Acute systolic (congestive) heart failure: Secondary | ICD-10-CM | POA: Diagnosis not present

## 2020-12-13 DIAGNOSIS — I272 Pulmonary hypertension, unspecified: Secondary | ICD-10-CM | POA: Diagnosis present

## 2020-12-13 DIAGNOSIS — I420 Dilated cardiomyopathy: Secondary | ICD-10-CM | POA: Diagnosis present

## 2020-12-13 DIAGNOSIS — F1410 Cocaine abuse, uncomplicated: Secondary | ICD-10-CM

## 2020-12-13 DIAGNOSIS — I5043 Acute on chronic combined systolic (congestive) and diastolic (congestive) heart failure: Secondary | ICD-10-CM

## 2020-12-13 DIAGNOSIS — F1010 Alcohol abuse, uncomplicated: Secondary | ICD-10-CM | POA: Diagnosis not present

## 2020-12-13 DIAGNOSIS — I25118 Atherosclerotic heart disease of native coronary artery with other forms of angina pectoris: Secondary | ICD-10-CM | POA: Diagnosis not present

## 2020-12-13 DIAGNOSIS — Z6837 Body mass index (BMI) 37.0-37.9, adult: Secondary | ICD-10-CM | POA: Diagnosis not present

## 2020-12-13 DIAGNOSIS — Z5901 Sheltered homelessness: Secondary | ICD-10-CM | POA: Diagnosis not present

## 2020-12-13 DIAGNOSIS — I5023 Acute on chronic systolic (congestive) heart failure: Secondary | ICD-10-CM | POA: Diagnosis not present

## 2020-12-13 DIAGNOSIS — I2510 Atherosclerotic heart disease of native coronary artery without angina pectoris: Secondary | ICD-10-CM | POA: Insufficient documentation

## 2020-12-13 DIAGNOSIS — I255 Ischemic cardiomyopathy: Secondary | ICD-10-CM | POA: Diagnosis present

## 2020-12-13 DIAGNOSIS — I340 Nonrheumatic mitral (valve) insufficiency: Secondary | ICD-10-CM | POA: Diagnosis not present

## 2020-12-13 DIAGNOSIS — K219 Gastro-esophageal reflux disease without esophagitis: Secondary | ICD-10-CM | POA: Diagnosis present

## 2020-12-13 DIAGNOSIS — Z79899 Other long term (current) drug therapy: Secondary | ICD-10-CM | POA: Diagnosis not present

## 2020-12-13 DIAGNOSIS — I361 Nonrheumatic tricuspid (valve) insufficiency: Secondary | ICD-10-CM | POA: Diagnosis not present

## 2020-12-13 DIAGNOSIS — G629 Polyneuropathy, unspecified: Secondary | ICD-10-CM | POA: Diagnosis present

## 2020-12-13 LAB — COMPREHENSIVE METABOLIC PANEL
ALT: 20 U/L (ref 0–44)
AST: 23 U/L (ref 15–41)
Albumin: 3.3 g/dL — ABNORMAL LOW (ref 3.5–5.0)
Alkaline Phosphatase: 100 U/L (ref 38–126)
Anion gap: 10 (ref 5–15)
BUN: 23 mg/dL — ABNORMAL HIGH (ref 6–20)
CO2: 24 mmol/L (ref 22–32)
Calcium: 8.3 mg/dL — ABNORMAL LOW (ref 8.9–10.3)
Chloride: 104 mmol/L (ref 98–111)
Creatinine, Ser: 1.29 mg/dL — ABNORMAL HIGH (ref 0.61–1.24)
GFR, Estimated: >60 mL/min (ref >60)
Glucose, Bld: 115 mg/dL — ABNORMAL HIGH (ref 70–99)
Potassium: 3.3 mmol/L — ABNORMAL LOW (ref 3.5–5.1)
Sodium: 138 mmol/L (ref 135–145)
Total Bilirubin: 1 mg/dL (ref 0.3–1.2)
Total Protein: 6.9 g/dL (ref 6.5–8.1)

## 2020-12-13 LAB — CBC
HCT: 32.8 % — ABNORMAL LOW (ref 39.0–52.0)
Hemoglobin: 10.7 g/dL — ABNORMAL LOW (ref 13.0–17.0)
MCH: 25.9 pg — ABNORMAL LOW (ref 26.0–34.0)
MCHC: 32.6 g/dL (ref 30.0–36.0)
MCV: 79.4 fL — ABNORMAL LOW (ref 80.0–100.0)
Platelets: 301 10*3/uL (ref 150–400)
RBC: 4.13 MIL/uL — ABNORMAL LOW (ref 4.22–5.81)
RDW: 16.8 % — ABNORMAL HIGH (ref 11.5–15.5)
WBC: 6.1 10*3/uL (ref 4.0–10.5)
nRBC: 0.3 % — ABNORMAL HIGH (ref 0.0–0.2)

## 2020-12-13 LAB — BASIC METABOLIC PANEL
Anion gap: 9 (ref 5–15)
BUN: 24 mg/dL — ABNORMAL HIGH (ref 6–20)
CO2: 25 mmol/L (ref 22–32)
Calcium: 8.5 mg/dL — ABNORMAL LOW (ref 8.9–10.3)
Chloride: 106 mmol/L (ref 98–111)
Creatinine, Ser: 1.42 mg/dL — ABNORMAL HIGH (ref 0.61–1.24)
GFR, Estimated: 58 mL/min — ABNORMAL LOW (ref >60)
Glucose, Bld: 142 mg/dL — ABNORMAL HIGH (ref 70–99)
Potassium: 3.5 mmol/L (ref 3.5–5.1)
Sodium: 140 mmol/L (ref 135–145)

## 2020-12-13 LAB — MAGNESIUM: Magnesium: 2 mg/dL (ref 1.7–2.4)

## 2020-12-13 LAB — RESP PANEL BY RT-PCR (FLU A&B, COVID) ARPGX2
Influenza A by PCR: NEGATIVE
Influenza B by PCR: NEGATIVE
SARS Coronavirus 2 by RT PCR: NEGATIVE

## 2020-12-13 LAB — BRAIN NATRIURETIC PEPTIDE: B Natriuretic Peptide: 3111.3 pg/mL — ABNORMAL HIGH (ref 0.0–100.0)

## 2020-12-13 LAB — TSH: TSH: 1.037 u[IU]/mL (ref 0.350–4.500)

## 2020-12-13 LAB — TROPONIN I (HIGH SENSITIVITY)
Troponin I (High Sensitivity): 29 ng/L — ABNORMAL HIGH (ref <18)
Troponin I (High Sensitivity): 30 ng/L — ABNORMAL HIGH (ref <18)

## 2020-12-13 MED ORDER — LISINOPRIL 5 MG PO TABS
5.0000 mg | ORAL_TABLET | Freq: Every day | ORAL | Status: DC
  Administered 2020-12-13: 5 mg via ORAL
  Filled 2020-12-13: qty 1

## 2020-12-13 MED ORDER — ATORVASTATIN CALCIUM 20 MG PO TABS
40.0000 mg | ORAL_TABLET | Freq: Every day | ORAL | Status: DC
  Administered 2020-12-13 – 2020-12-17 (×5): 40 mg via ORAL
  Filled 2020-12-13 (×5): qty 2

## 2020-12-13 MED ORDER — ENOXAPARIN SODIUM 40 MG/0.4ML ~~LOC~~ SOLN: 40.0000 mg | SUBCUTANEOUS | Status: DC

## 2020-12-13 MED ORDER — SODIUM CHLORIDE 0.9% FLUSH
3.0000 mL | Freq: Two times a day (BID) | INTRAVENOUS | Status: DC
  Administered 2020-12-13 – 2020-12-16 (×7): 3 mL via INTRAVENOUS

## 2020-12-13 MED ORDER — ENOXAPARIN SODIUM 60 MG/0.6ML ~~LOC~~ SOLN
0.5000 mg/kg | SUBCUTANEOUS | Status: DC
  Administered 2020-12-13: 62.5 mg via SUBCUTANEOUS
  Filled 2020-12-13 (×3): qty 1.2

## 2020-12-13 MED ORDER — CARVEDILOL 6.25 MG PO TABS
6.2500 mg | ORAL_TABLET | Freq: Two times a day (BID) | ORAL | Status: DC
  Administered 2020-12-13 – 2020-12-16 (×8): 6.25 mg via ORAL
  Filled 2020-12-13 (×8): qty 1

## 2020-12-13 MED ORDER — PANTOPRAZOLE SODIUM 40 MG PO TBEC
40.0000 mg | DELAYED_RELEASE_TABLET | Freq: Every day | ORAL | Status: DC
  Administered 2020-12-13 – 2020-12-17 (×5): 40 mg via ORAL
  Filled 2020-12-13 (×5): qty 1

## 2020-12-13 MED ORDER — ONDANSETRON HCL 4 MG/2ML IJ SOLN: 4.0000 mg | Freq: Four times a day (QID) | INTRAMUSCULAR | Status: DC | PRN

## 2020-12-13 MED ORDER — TRAZODONE HCL 50 MG PO TABS
25.0000 mg | ORAL_TABLET | Freq: Every evening | ORAL | Status: DC | PRN
  Administered 2020-12-15 – 2020-12-16 (×3): 25 mg via ORAL
  Filled 2020-12-13 (×3): qty 1

## 2020-12-13 MED ORDER — FUROSEMIDE 10 MG/ML IJ SOLN
80.0000 mg | Freq: Two times a day (BID) | INTRAMUSCULAR | Status: DC
  Administered 2020-12-13 – 2020-12-14 (×4): 80 mg via INTRAVENOUS
  Filled 2020-12-13 (×4): qty 8

## 2020-12-13 MED ORDER — ASPIRIN EC 81 MG PO TBEC
81.0000 mg | DELAYED_RELEASE_TABLET | Freq: Every day | ORAL | Status: DC
  Administered 2020-12-13 – 2020-12-17 (×5): 81 mg via ORAL
  Filled 2020-12-13 (×5): qty 1

## 2020-12-13 MED ORDER — ONDANSETRON HCL 4 MG PO TABS: 4.0000 mg | ORAL_TABLET | Freq: Four times a day (QID) | ORAL | Status: DC | PRN

## 2020-12-13 MED ORDER — ACETAMINOPHEN 650 MG RE SUPP: 650.0000 mg | Freq: Four times a day (QID) | RECTAL | Status: DC | PRN

## 2020-12-13 MED ORDER — ACETAMINOPHEN 325 MG PO TABS: 650.0000 mg | ORAL_TABLET | Freq: Four times a day (QID) | ORAL | Status: DC | PRN

## 2020-12-13 MED ORDER — MAGNESIUM HYDROXIDE 400 MG/5ML PO SUSP: 30.0000 mL | Freq: Every day | ORAL | Status: DC | PRN

## 2020-12-13 MED ORDER — POTASSIUM CHLORIDE CRYS ER 20 MEQ PO TBCR
40.0000 meq | EXTENDED_RELEASE_TABLET | Freq: Once | ORAL | Status: AC
  Administered 2020-12-13: 40 meq via ORAL
  Filled 2020-12-13: qty 2

## 2020-12-13 MED ORDER — ISOSORB DINITRATE-HYDRALAZINE 20-37.5 MG PO TABS
1.0000 | ORAL_TABLET | Freq: Three times a day (TID) | ORAL | Status: DC
  Administered 2020-12-13 – 2020-12-16 (×11): 1 via ORAL
  Filled 2020-12-13 (×17): qty 1

## 2020-12-13 MED ORDER — POTASSIUM CHLORIDE CRYS ER 20 MEQ PO TBCR
20.0000 meq | EXTENDED_RELEASE_TABLET | Freq: Every day | ORAL | Status: DC
  Administered 2020-12-13 – 2020-12-17 (×5): 20 meq via ORAL
  Filled 2020-12-13 (×5): qty 1

## 2020-12-13 MED ORDER — GABAPENTIN 300 MG PO CAPS
300.0000 mg | ORAL_CAPSULE | Freq: Every day | ORAL | Status: DC
  Administered 2020-12-13 – 2020-12-16 (×4): 300 mg via ORAL
  Filled 2020-12-13 (×4): qty 1

## 2020-12-13 NOTE — Consult Note (Signed)
Cardiology Consultation:   Patient ID: Dale RocheRonald [name] MRN: [id number]; DOB: [birth year]  Admit date: 12/12/2020 Date of Consult: 12/13/2020  Primary Care Provider: Vesta MixerNahser, Philip J, MD [facility] Cardiologist: Kristeen MissPhilip Nahser, MD  [facility] Electrophysiologist:  None    Patient Profile:   Dale [name] is a 60 y.o. 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 Nelson ChimesAmin.  History of Present Illness:   Dale [name] is a 60 year old male with PMH as above.  [facility] with primary cardiologist Dr. Elease HashimotoNahser. He has history of cocaine and alcohol use. He has history of chronic combined heart failure with most recent 09/2020 echo showing EF 20%.  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   He was last admitted 10/2020 for heart failure exacerbation after using cocaine the night before.  He had NSVT with cardiology consultation requested.  At that time, he reported his living situation was suboptimal and it was difficult for him to avoid drugs.  He had recently run out of almost all of his medications.  He did not have a home scale.  He was successfully IV diuresed with discharge on torsemide.  Discharge 10/21/2020 with Coreg, BiDil, torsemide.  It was noted that he intended to pursue rehab after Christmas.  On 12/12/2020, [facility] with report of volume overload.  He stated his weight had increased 20 pounds over the last week, though he states he still does not have a scale (though getting one soon). He had uncomfortable swelling of the feet and legs. He reported orthopnea, PND, and  nonproductive cough.  He had not slept for the past 4 nights due to orthopnea and PND.  He also had noted significant DOE with minimal exertion (10 steps).  No reported fevers or chills.  No chest pain, racing heart rate. He did report palpitations. No presyncope, or syncope.  No reported signs or symptoms of bleeding. He reported L leg pain 2/2 his position in the bed and suspected as MSK in etiology.  He was taking 20 mg twice daily without relief.  Last cocaine use reported 2 weeks ago.  He reported last alcohol use as "Arbor Mist 3 days ago and only one glass due to someone in the neighborhood selling it door to door, otherwise he never drinks any alcohol, and that this is only 6% so like cooking wine."    In the ED, Initial vitals 141/92 with HR 101 bpm and 99% on room air.  Labs showed mild hypokalemia 3.3, creatinine 1.29, BUN 23.  High-sensitivity troponin 29.  BNP 3111.3.  CBC showed chronic anemia.  COVID-19 negative.  EKG showed NSR, 94 bpm, PVCs, 1st degree AVB, IVCD with LPFB, RAD, poor R wave progression , lateral inferior TWI, prolonged QTC 595 ms and no acute changes from pregvious 11/20/20 EKG.  Chest x-ray without significant findings.  He was started on IV diuresis with Lasix 80 mg and potassium 40 M EQ.  Past Medical History:  Diagnosis Date  . Alcohol abuse   . Chronic combined systolic and diastolic CHF (congestive heart failure) (HCC)    a. 09/27/18 showed mild LVH, EF  20-25%, grade 2 DD, mild MR, severely dilated LA, mildly dilated RV with mildly reduced RV function, mod RAE.  Marland Kitchen Cocaine abuse (HCC)   . Hypertension     Past Surgical History:  Procedure Laterality Date  . RIGHT/LEFT HEART CATH AND CORONARY ANGIOGRAPHY N/A 05/10/2019   Procedure: RIGHT/LEFT HEART CATH AND CORONARY ANGIOGRAPHY;  Surgeon: Yvonne Kendall, MD;  Location: [facility] INVASIVE CV LAB;  Service: Cardiovascular;  Laterality: N/A;     Home Medications:  Prior to Admission medications   Medication Sig Start  Date End Date Taking? Authorizing Provider  atorvastatin (LIPITOR) 40 MG tablet Take 1 tablet (40 mg total) by mouth daily. 10/21/20 11/20/20  Noralee Stain, DO  carvedilol (COREG) 6.25 MG tablet Take 1 tablet (6.25 mg total) by mouth 2 (two) times daily with a meal. 10/21/20 11/20/20  Noralee Stain, DO  diclofenac sodium (VOLTAREN) 1 % GEL Apply 2 g topically 4 (four) times daily as needed (pain).    [provider]  gabapentin (NEURONTIN) 300 MG capsule Take 1 capsule (300 mg total) by mouth at bedtime. 10/21/20   Noralee Stain, DO  isosorbide-hydrALAZINE (BIDIL) 20-37.5 MG tablet Take 1 tablet by mouth 3 (three) times daily. 10/21/20   Noralee Stain, DO  pantoprazole (PROTONIX) 40 MG tablet Take 1 tablet (40 mg total) by mouth daily. 10/21/20   Noralee Stain, DO  potassium chloride SA (KLOR-CON) 20 MEQ tablet Take 1 tablet (20 mEq total) by mouth daily. 12/11/20   Nahser, Deloris Ping, MD  torsemide (DEMADEX) 20 MG tablet Take 1 tablet (20 mg total) by mouth 2 (two) times daily. 10/21/20   Noralee Stain, DO    Inpatient Medications: Scheduled Meds: . aspirin EC  81 mg Oral Daily  . atorvastatin  40 mg Oral Daily  . carvedilol  6.25 mg Oral BID WC  . enoxaparin (LOVENOX) injection  0.5 mg/kg Subcutaneous Q24H  . furosemide  80 mg Intravenous Q12H  . gabapentin  300 mg Oral QHS  . isosorbide-hydrALAZINE  1 tablet Oral TID  . pantoprazole  40 mg Oral Daily  . potassium chloride SA  20 mEq Oral Daily   Continuous Infusions:  PRN Meds: acetaminophen **OR** acetaminophen, magnesium hydroxide, ondansetron **OR** ondansetron (ZOFRAN) IV, traZODone  Allergies:    Allergies  Allergen Reactions  . Hydrocodone Itching    Social History:   Social History   Socioeconomic History  . Marital status: Single    Spouse name: none  . Number of children: Not on file  . Years of education: Not on file  . Highest education level: Not on file  Occupational History  . Occupation:  Disabled  Tobacco Use  . Smoking status: Never Smoker  . Smokeless tobacco: Never Used  Vaping Use  . Vaping Use: Never used  Substance and Sexual Activity  . Alcohol use: Yes    Comment: 2 quarts a week   . Drug use: Yes    Frequency: 3.0 times per week    Types: Marijuana, Cocaine  . Sexual activity: Not Currently  Other Topics Concern  . Not on file  Social History Narrative  . Not on file   Social Determinants of Health   Financial Resource Strain: Medium Risk  . Difficulty of Paying Living Expenses: Somewhat hard  Food Insecurity: Food Insecurity Present  . Worried About Programme researcher, broadcasting/film/video in the Last Year: Sometimes true  . Ran Out of Food in the Last Year: Sometimes true  Transportation Needs: Unmet Transportation Needs  .  Lack of Transportation (Medical): Yes  . Lack of Transportation (Non-Medical): Yes  Physical Activity: Not on file  Stress: Not on file  Social Connections: Not on file  Intimate Partner Violence: Not on file    Family History:    Family History  Problem Relation Age of Onset  . Hypertension Maternal Grandmother      ROS:  Please see the history of present illness.  Review of Systems  Constitutional: Positive for malaise/fatigue. Negative for chills and fever.  Respiratory: Positive for cough and shortness of breath. Negative for hemoptysis.   Cardiovascular: Positive for palpitations, orthopnea, leg swelling and PND. Negative for chest pain and claudication.  Gastrointestinal: Negative for abdominal pain, blood in stool and melena.  Genitourinary: Negative for hematuria.  Musculoskeletal: Positive for myalgias. Negative for falls.       L leg pain due to position in bed (positional per pt), and suspected as MSK    Neurological: Negative for dizziness, focal weakness and loss of consciousness.  All other systems reviewed and are negative.   All other ROS reviewed and negative.     Physical Exam/Data:   Vitals:   12/13/20 0830  12/13/20 1015 12/13/20 1134 12/13/20 1135  BP: 121/75   (!) 116/91  Pulse: 93  92 96  Resp: (!) 25  (!) 28 18  Temp:      TempSrc:      SpO2: 97%  99% 99%  Weight:  113.8 kg      Intake/Output Summary (Last 24 hours) at 12/13/2020 1140 Last data filed at 12/13/2020 1023 Gross per 24 hour  Intake 205 ml  Output 3100 ml  Net -2895 ml   Last 3 Weights 12/13/2020 12/13/2020 11/20/2020  Weight (lbs) 250 lb 14.4 oz 270 lb 12.8 oz 275 lb  Weight (kg) 113.807 kg 122.834 kg 124.739 kg     Body mass index is 34.99 kg/m.  General: Obese male, no acute distress HEENT: normal Lymph: no adenopathy Neck: JVD difficult to assess due to body habitus Endocrine:  No thryomegaly Vascular: No carotid bruits; FA pulses 2+ bilaterally without bruits  Cardiac:  normal S1, S2; RRR with extrasystole; 1/6 systolic murmur Lungs: Distant breath sounds, CTA B Abd: soft, nontender, no hepatomegaly  Ext: Bilateral 1-2+ edema R>L.  No erythema or tenderness to palpation.  No palpable cord. Musculoskeletal:  No deformities, BUE and BLE strength normal and equal Skin: warm and dry  Neuro:  CNs 2-12 intact, no focal abnormalities noted Psych:  Normal affect   EKG:  The EKG was personally reviewed and demonstrates:  SR, 94 bpm, PVCs, 1st degree AVB, IVCD with LPFB, RAD, poor R wave progression , lateral inferior TWI, prolonged QTC 595 ms and no acute changes from pregvious 11/20/20 EKG. Telemetry:  Telemetry was personally reviewed and demonstrates: NSR, ST, PVCs, bigeminy, average rate 90s to low 100s  Relevant CV Studies: Limited echo pending  Echo 09/2020 1. Left ventricular ejection fraction, by estimation, is approximately  20%. The left ventricle has severely decreased function. The left  ventricle demonstrates global hypokinesis with regional variation as  noted. The left ventricular internal cavity  size was moderately dilated. Left ventricular diastolic parameters are  consistent with Grade III  diastolic dysfunction (restrictive). No obvious  mural thrombus noted with Definity contrast.  2. Right ventricular systolic function is moderately to severely reduced.  The right ventricular size is normal. There is moderately elevated  pulmonary artery systolic pressure. The estimated right ventricular  systolic pressure  is 52.0 mmHg.  3. Left atrial size was mild to moderately dilated.  4. Right atrial size was mild to moderately dilated.  5. The mitral valve is grossly normal. Mild mitral valve regurgitation.  6. The aortic valve is tricuspid. Aortic valve regurgitation is trivial.  7. The inferior vena cava is dilated in size with <50% respiratory  variability, suggesting right atrial pressure of 15 mmHg.   R/LHC 05/2019 Conclusions: 1. Severe single-vessel coronary artery disease with chronic total occlusion of the proximal RCA. 2. Mild to moderate, non-obstructive coronary artery disease involving the LAD and LCx. 3. Moderately elevated left heart filling pressures. 4. Severely elevated right heart filling pressures. 5. Mild pulmonary hypertension. 6. Normal Fick cardiac output/index. Recommendations: 1. 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  Patient will be transported back to [facility] after recovery from today's procedure. 2. Continue diuresis and optimization of evidence-based heart failure therapy.  I will restart furosemide 40 mg IV BID this afternoon. 3. Secondary prevention of coronary artery disease, including high-intensity statin therapy and avoidance of cocaine.    Laboratory Data:  High Sensitivity Troponin:   Recent Labs  Lab 12/12/20 2302 12/13/20 0013  TROPONINIHS 30* 29*     Chemistry Recent Labs  Lab 12/13/20 0013 12/13/20 0532  NA 138 140  K 3.3* 3.5  CL 104 106  CO2 24 25  GLUCOSE 115* 142*  BUN 23* 24*  CREATININE 1.29* 1.42*  CALCIUM 8.3* 8.5*  GFRNONAA >60 58*  ANIONGAP 10 9    Recent  Labs  Lab 12/13/20 0013  PROT 6.9  ALBUMIN 3.3*  AST 23  ALT 20  ALKPHOS 100  BILITOT 1.0   Hematology Recent Labs  Lab 12/12/20 2302 12/13/20 0532  WBC 5.6 6.1  RBC 4.13* 4.13*  HGB 10.8* 10.7*  HCT 32.6* 32.8*  MCV 78.9* 79.4*  MCH 26.2 25.9*  MCHC 33.1 32.6  RDW 17.1* 16.8*  PLT 291 301   BNP Recent Labs  Lab 12/12/20 2302  BNP 3,111.3*    DDimer No results for input(s): DDIMER in the last 168 hours.   Radiology/Studies:  DG Chest 2 View  Result Date: 12/12/2020 CLINICAL DATA:  Short of breath, bilateral lower extremity edema EXAM: CHEST - 2 VIEW COMPARISON:  11/20/2020 FINDINGS: Frontal and lateral views of the chest demonstrate stable enlarged cardiac silhouette. No airspace disease, effusion, or pneumothorax. No acute bony abnormalities. IMPRESSION: 1. Stable enlarged cardiac silhouette.  No acute airspace disease. Electronically Signed   By: Sharlet Salina M.D.   On: 12/12/2020 23:20     Assessment and Plan:   Acute on chronic combined systolic congestive heart failure (EF 20% 09/2020) --NYHA class III-IV sx. Volume overloaded on exam.  BNP significantly elevated.  Continue IV diuresis with Lasix 80 mg twice daily   Goal net output -2 L net daily.    Continue until euvolemic on exam or bump in renal function.  Monitor I's/O's, daily standing weights.  Suspect will need increased oral diuresis at discharge.  KCL supplementation - continue.  Daily BMET  Replete with K goal 4.0, Mg goal 2.0.  Escalate GDMT as tolerated   Coreg continued , given as combined alpha beta receptor and acceptable in those with history of cocaine use.  If renal function and BP allow, Entresto +/-Spironolactone recommended now or in the outpatient setting.  Consider also [name] in the outpatient setting.  Limited echo ordered and pending to update EF, as well as rule out WMA/acute structural  changes.   Further recommendations as needed pending results.  Escalate  GDMT as tolerated CHF education provided.    Recommended Mrs. Dash.  Cessation of EtOH/drug use recommended.  Recommend follow-up in CHF clinic/office.  Elevated HS Tn, suspect supply demand ischemia History of CAD / CTO RCA (05/2019) --No chest pain.  Previous cath and echo as above.   High-sensitivity troponin this admission is minimally elevated and flat trending.  Not consistent with ACS at this time.  Suspect supply demand ischemia in the setting of volume overload, elevated BP, tachycardic rate, and CKD.  Limited echo to reassess EF, rule out WMA, and rule out acute structural changes.  Continue medical management with ASA, BB, Bidil, statin.  Escalation of GDMT as tolerated and as outlined above.  No recommendation for IV heparin or invasive ischemic work-up at this time and pending echo results.  NSVT, PVCs --Reports palpitations but no presyncope or loss of consciousness.   Check TSH  Replete potassium with goal 4.0, check magnesium with goal 2.0.  Continue to monitor on telemetry.  Future considerations could include discharge on Zio monitor, given ectopy and reduced EF to quantify burden.  Reassess before discharge.  Prolonged QTc  Discontinue Zofran, which will prolong QT.     Caution with QT prolonging medications.  HTN, Goal BP 130/80 or lower --Continue BiDil, Coreg, and diuresis.   --Titrate antihypertensives as needed with BP goal 130/80 or lower.  HLD, LDL goal 70 --Continue statin for aggressive risk factor modification.  LDL goal below 70.  CKD --Daily BMET.  Baseline creatinine appears between 1.3-1.5.  Replete electrolytes with potassium goal 4.0 magnesium goal 2.0.  Caution with nephrotoxins.  Anemia of chronic disease --Daily CBC.  Substance use -cocaine, EtOH --Complete cessation advised.  He reports difficulty with rehab due to COVID-19 restrictions.     TIMI Risk Score for Unstable Angina or Non-ST Elevation MI:   The patient's TIMI risk score  is 4, which indicates a 20% risk of all cause mortality, new or recurrent myocardial infarction or need for urgent revascularization in the next 14 days.   New York Heart Association (NYHA) Functional Class NYHA Class III        For questions or updates, please contact [facility] Please consult [email or web address] for contact info under    Signed, Lennon Alstrom, PA-C  12/13/2020 11:40 AM

## 2020-12-13 NOTE — Consult Note (Addendum)
   Heart Failure Nurse Navigator Note  HFrEF 20-25%.  Left ventricle with global hypokinesis.  The left ventricle internal cavity is moderately dilated.  Ventricular diastolic dysfunction is grade 3.  The right ventricular systolic function is moderately to severely reduced.  Mild to moderate biatrial enlargement.  Mild mitral regurgitation.  Echocardiogram performed in November 2021.  Comorbidities:  Hypertension Hyperlipidemia Chronic kidney disease Anemia Morbid obesity Coronary artery disease   Known history of substance and alcohol abuse.  Medications:  Aspirin 81 mg daily Atorvastatin 40 mg daily Coreg 6.25 mg twice daily Furosemide 80 mg IV every 12 Isosorbide/hydralazine 20/37.51 tablet 3 times daily Potassium chloride 20 mEq daily  Labs:  Sodium 140, potassium 3.5, chloride 106, CO2 25, BUN 24, creatinine 1.42 up from 1.  2 9 of yesterday, troponin 29 and 30, albumin 3.3, AST 23, ALT 20 Blood pressure 120/94 Weight 113 kg BMI 34.99   Assessment:  General-he is awake and alert lying on a gurney in the emergency room.  In no acute distress.   HEENT-pupils are equal, nonicteric, positive JVD  Cardiac-heart tones of regular rate and rhythm.  Chest-breath sounds are clear to posterior auscultation.  Abdomen-distended, nontender  Musculoskeletal-pitting lower extremity edema.  Neurologic-speech is clear, moves all extremities.  Psych-is pleasant and appropriate makes good eye contact.   Initial meeting with patient.  Discussed daily weights which he is not doing because he does not have a scale.  Instructed would contact care coordinators to see if they could supply him with a scale for at home.  He knows that he should stick with 2000 mL fluid restriction daily.  Along with 2000 mg sodium restriction.  Discussed spices, using lemon juice, vinegar and Mrs. Dash for seasonings of his foods.  He states that his mates cook with salt.  He states that he is  also interested in following with Clar[facility] in the outpatient clinic, given appointment for February 21 at !:30 PM.  Will arrange transportation.  He was given living with heart failure teaching booklet, instructed we will continue to follow along as long as he is an inpatient.  Tresa Endo RN [facility]

## 2020-12-13 NOTE — ED Notes (Signed)
Pt voided 700cc urine.

## 2020-12-13 NOTE — ED Notes (Signed)
Rounding MD at bedside 

## 2020-12-13 NOTE — ED Notes (Signed)
Pt voided 700 cc urine.  Pt sitting up on edge of bed.  nsr on monitor.  Pt watching tv.  States I feel better.

## 2020-12-13 NOTE — Telephone Encounter (Signed)
CSW received a call from patient stating he will be admitted to [facility] for fluid overload. Patient states he has not heard from [facility] about bed availability but will call June in admissions and inform of hospitalization. Patient verbalizes understanding of follow up needed for admission to [facility] recovery program.  CSW available as needed.

## 2020-12-13 NOTE — Progress Notes (Signed)
PHARMACIST - PHYSICIAN COMMUNICATION  CONCERNING:  Enoxaparin (Lovenox) for DVT Prophylaxis    RECOMMENDATION: Patient was prescribed enoxaprin 40mg  q24 hours for VTE prophylaxis.   Filed Weights   12/13/20 0324  Weight: 122.8 kg (270 lb 12.8 oz)    Body mass index is 37.77 kg/m.  Estimated Creatinine Clearance: 84.3 mL/min (A) (by C-G formula based on SCr of 1.29 mg/dL (H)).   Based on [facility] policy patient is candidate for enoxaparin 0.5mg /kg TBW SQ every 24 hours based on BMI being >30.  DESCRIPTION: Pharmacy has adjusted enoxaparin dose per [facility] policy.  Patient is now receiving enoxaparin 0.5 mg/kg every 24 hours   [facility], PharmD, [facility] 12/13/2020 3:53 AM

## 2020-12-13 NOTE — Telephone Encounter (Signed)
CSW received call from patient yesterday stating that he needed a ride to the check cashing place to cash his check and obtain his medications. Patient also reported that [facility] did not have a bed available on Monday and he plans to follow up tomorrow for bed availability. CSW attempted to contact patient this morning with no answer and it appears based on chart review that patient is in the ED at [facility].  CSW available if needed. [name], LCSW, CCSW-MCS [phone]

## 2020-12-13 NOTE — Progress Notes (Signed)
PROGRESS NOTE    Dale [name]  EML:[id number] DOB: 05-14-1961 DOA: 12/12/2020 PCP: Vesta Mixer, MD   Brief Narrative: Taken from H&P Crue Otero is a 60 y.o. African-American male with medical history significant for combined systolic and diastolic CHF, cocaine abuse, hypertension and alcohol abuse, who presented to the emergency room with acute onset of worsening dyspnea with worsening lower extremity edema.  The patient was given about 20 pounds over the last week.  He admitted to orthopnea, paroxysmal nocturnal dyspnea as well as dry cough.  He has been having significant dyspnea on exertion even walking around the house. Patient has an history of cocaine use, last use was a week ago. Found to have positive troponin and BNP of 3111.  COVID-19 negative. Admitted for acute on chronic heart failure.  Subjective: Patient continued to feel short of breath, sitting in chair, stating that he is unable to sleep for many nights as he cannot lay down.  Denies any chest pain.  Assessment & Plan:   Active Problems:   * No active hospital problems. *  Acute on chronic HFrEF.  Patient with markedly volume overload and EF of 20%.  Cardiology was also consulted. -Continue with Lasix IV, 80 mg twice daily. -Strict intake and output -Daily weight and BNP -We will need higher dose of torsemide on discharge -Continue Coreg -Continue with dual  Essential hypertension.  Blood pressure within goal. -Continue Coreg and BiDil  Dyslipidemia. -Continue statin  GERD. -Continue PPI  Peripheral neuropathy. -Continue Neurontin  Objective: Vitals:   12/13/20 1437 12/13/20 1438 12/13/20 1527 12/13/20 1531  BP: 115/77   130/89  Pulse: 90   90  Resp:  (!) 27  20  Temp:    (!) 97.5 F (36.4 C)  TempSrc:    Oral  SpO2: 99%   100%  Weight:   123.5 kg   Height:   5\' 11"  (1.803 m)     Intake/Output Summary (Last 24 hours) at 12/13/2020 1741 Last data filed at 12/13/2020 1240 Gross per 24 hour   Intake 445 ml  Output 3600 ml  Net -3155 ml   Filed Weights   12/13/20 0324 12/13/20 1015 12/13/20 1527  Weight: 122.8 kg 113.8 kg 123.5 kg    Examination:  General exam: Appears calm and comfortable  Respiratory system: Clear to auscultation. Respiratory effort normal. Cardiovascular system: S1 & S2 heard, RRR.  Gastrointestinal system: Soft, nontender, nondistended, bowel sounds positive. Central nervous system: Alert and oriented. No focal neurological deficits. Extremities: 3+ LE edema, no cyanosis, pulses intact and symmetrical. Psychiatry: Judgement and insight appear normal. Mood & affect appropriate.    DVT prophylaxis: Lovenox Code Status: Full Family Communication: Discussed with patient Disposition Plan:  Status is: Inpatient  Remains inpatient appropriate because:Inpatient level of care appropriate due to severity of illness   Dispo: The patient is from: Home              Anticipated d/c is to: Home              Anticipated d/c date is: 2 days              Patient currently is not medically stable to d/c.   Difficult to place patient No              Level of care: Progressive Cardiac  Consultants:   Cardiology  Procedures:  Antimicrobials:   Data Reviewed: I have personally reviewed following labs and imaging studies  CBC: Recent  Labs  Lab 12/12/20 2302 12/13/20 0532  WBC 5.6 6.1  NEUTROABS 3.7  --   HGB 10.8* 10.7*  HCT 32.6* 32.8*  MCV 78.9* 79.4*  PLT 291 301   Basic Metabolic Panel: Recent Labs  Lab 12/13/20 0013 12/13/20 0532  NA 138 140  K 3.3* 3.5  CL 104 106  CO2 24 25  GLUCOSE 115* 142*  BUN 23* 24*  CREATININE 1.29* 1.42*  CALCIUM 8.3* 8.5*  MG  --  2.0   GFR: Estimated Creatinine Clearance: 76.8 mL/min (A) (by C-G formula based on SCr of 1.42 mg/dL (H)). Liver Function Tests: Recent Labs  Lab 12/13/20 0013  AST 23  ALT 20  ALKPHOS 100  BILITOT 1.0  PROT 6.9  ALBUMIN 3.3*   No results for input(s): LIPASE,  AMYLASE in the last 168 hours. No results for input(s): AMMONIA in the last 168 hours. Coagulation Profile: No results for input(s): INR, PROTIME in the last 168 hours. Cardiac Enzymes: No results for input(s): CKTOTAL, CKMB, CKMBINDEX, TROPONINI in the last 168 hours. BNP (last 3 results) No results for input(s): PROBNP in the last 8760 hours. HbA1C: No results for input(s): HGBA1C in the last 72 hours. CBG: No results for input(s): GLUCAP in the last 168 hours. Lipid Profile: No results for input(s): CHOL, HDL, LDLCALC, TRIG, CHOLHDL, LDLDIRECT in the last 72 hours. Thyroid Function Tests: Recent Labs    12/13/20 0532  TSH 1.037   Anemia Panel: No results for input(s): VITAMINB12, FOLATE, FERRITIN, TIBC, IRON, RETICCTPCT in the last 72 hours. Sepsis Labs: No results for input(s): PROCALCITON, LATICACIDVEN in the last 168 hours.  Recent Results (from the past 240 hour(s))  Resp Panel by RT-PCR (Flu A&B, Covid) Nasopharyngeal Swab     Status: None   Collection Time: 12/13/20  1:12 AM   Specimen: Nasopharyngeal Swab; Nasopharyngeal(NP) swabs in vial transport medium  Result Value Ref Range Status   SARS Coronavirus 2 by RT PCR NEGATIVE NEGATIVE Final    Comment: (NOTE) SARS-CoV-2 target nucleic acids are NOT DETECTED.  The SARS-CoV-2 RNA is generally detectable in upper respiratory specimens during the acute phase of infection. The lowest concentration of SARS-CoV-2 viral copies this assay can detect is 138 copies/mL. A negative result does not preclude SARS-Cov-2 infection and should not be used as the sole basis for treatment or other patient management decisions. A negative result may occur with  improper specimen collection/handling, submission of specimen other than nasopharyngeal swab, presence of viral mutation(s) within the areas targeted by this assay, and inadequate number of viral copies(<138 copies/mL). A negative result must be combined with clinical  observations, patient history, and epidemiological information. The expected result is Negative.  Fact Sheet for Patients:  BloggerCourse.com  Fact Sheet for Healthcare Providers:  SeriousBroker.it  This test is no t yet approved or cleared by the [place] FDA and  has been authorized for detection and/or diagnosis of SARS-CoV-2 by FDA under an Emergency Use Authorization (EUA). This EUA will remain  in effect (meaning this test can be used) for the duration of the COVID-19 declaration under Section 564(b)(1) of the Act, 21 U.S.C.section 360bbb-3(b)(1), unless the authorization is terminated  or revoked sooner.       Influenza A by PCR NEGATIVE NEGATIVE Final   Influenza B by PCR NEGATIVE NEGATIVE Final    Comment: (NOTE) The Xpert Xpress SARS-CoV-2/FLU/RSV plus assay is intended as an aid in the diagnosis of influenza from Nasopharyngeal swab specimens and should not be  used as a sole basis for treatment. Nasal washings and aspirates are unacceptable for Xpert Xpress SARS-CoV-2/FLU/RSV testing.  Fact Sheet for Patients: BloggerCourse.com  Fact Sheet for Healthcare Providers: SeriousBroker.it  This test is not yet approved or cleared by the [place] FDA and has been authorized for detection and/or diagnosis of SARS-CoV-2 by FDA under an Emergency Use Authorization (EUA). This EUA will remain in effect (meaning this test can be used) for the duration of the COVID-19 declaration under Section 564(b)(1) of the Act, 21 U.S.C. section 360bbb-3(b)(1), unless the authorization is terminated or revoked.  Performed at [facility], [address] [place], [place] [address]      Radiology Studies: DG Chest 2 View  Result Date: 12/12/2020 CLINICAL DATA:  Short of breath, bilateral lower extremity edema EXAM: CHEST - 2 VIEW COMPARISON:  11/20/2020 FINDINGS:  Frontal and lateral views of the chest demonstrate stable enlarged cardiac silhouette. No airspace disease, effusion, or pneumothorax. No acute bony abnormalities. IMPRESSION: 1. Stable enlarged cardiac silhouette.  No acute airspace disease. Electronically Signed   By: Sharlet Salina M.D.   On: 12/12/2020 23:20    Scheduled Meds: . aspirin EC  81 mg Oral Daily  . atorvastatin  40 mg Oral Daily  . carvedilol  6.25 mg Oral BID WC  . enoxaparin (LOVENOX) injection  0.5 mg/kg Subcutaneous Q24H  . furosemide  80 mg Intravenous Q12H  . gabapentin  300 mg Oral QHS  . isosorbide-hydrALAZINE  1 tablet Oral TID  . pantoprazole  40 mg Oral Daily  . potassium chloride SA  20 mEq Oral Daily   Continuous Infusions:   LOS: 0 days   Time spent:   Arnetha Courser, MD [facility]  If 7PM-7AM, please contact night-coverage [email or web address]  12/13/2020, 5:41 PM   This record has been created using Conservation officer, historic buildings. Errors have been sought and corrected,but may not always be located. Such creation errors do not reflect on the standard of care.

## 2020-12-13 NOTE — ED Notes (Signed)
Pt ambulated and maintained oxygen sats 97-100% on room air

## 2020-12-13 NOTE — H&P (Signed)
[place]   PATIENT NAME: Dale [name]    MR#:  [id number]  DATE OF BIRTH:  20-Jul-1961  DATE OF ADMISSION:  12/12/2020  PRIMARY CARE PHYSICIAN: Nahser, Deloris Ping, MD   Patient is coming from: Home REQUESTING/REFERRING PHYSICIAN: Shaune Pollack, MD  CHIEF COMPLAINT:   Chief Complaint  Patient presents with  . Edema  -Shortness of breath  HISTORY OF PRESENT ILLNESS:  Dale [name] is a 60 y.o. African-American male with medical history significant for combined systolic and diastolic CHF, cocaine abuse, hypertension and alcohol abuse, who presented to the emergency room with acute onset of worsening dyspnea with worsening lower extremity edema.  The patient was given about 20 pounds over the last week.  He admitted to orthopnea, paroxysmal nocturnal dyspnea as well as dry cough.  He has been having significant dyspnea on exertion even walking around the house.  No fever or chills.  Denies any chest pain or palpitations.  He has been increasing his torsemide to 20 mg p.o. twice daily without significant relief.  He has not been able to sleep for the last 3 nights per his report due to  Orthopnea.  No dysuria, oliguria or hematuria or flank pain.  No nausea or vomiting or abdominal pain.  His last cocaine use was last week.  He drinks alcohol occasionally. ED Course: When he came to the ER blood pressure was 141/92 heart rate of 101 and respiratory rate of 24, temperature 97.3 and pulse ox and to 99% on room air.  Labs revealed mild hypokalemia 3.3, BUN 23 and creatinine 1.29, better than previous levels this month, high-sensitivity troponin of 29 and BNP of 3111.3 up from 2321 on 11/20/2020.  CBC showed anemia close to baseline.  Influenza antigens and COVID-19 PCR came back negative.  EKG as reviewed by me :  showed normal sinus rhythm with a rate of 94 with PVC's, prolonged PR interval, borderline right axis deviation and poor R wave progression and T wave inversion laterally and  inferiorly with prolonged QT interval (QTC 595 MS) Imaging: Two-view chest x-ray showed stable cardiomegaly.  The patient was given 80 mg of IV Lasix and 40 mEq p.o. potassium chloride.  He will be admitted to a progressive unit bed for further evaluation and management. PAST MEDICAL HISTORY:   Past Medical History:  Diagnosis Date  . Alcohol abuse   . Chronic combined systolic and diastolic CHF (congestive heart failure) (HCC)    a. 09/27/18 showed mild LVH, EF 20-25%, grade 2 DD, mild MR, severely dilated LA, mildly dilated RV with mildly reduced RV function, mod RAE.  Marland Kitchen Cocaine abuse (HCC)   . Hypertension     PAST SURGICAL HISTORY:   Past Surgical History:  Procedure Laterality Date  . RIGHT/LEFT HEART CATH AND CORONARY ANGIOGRAPHY N/A 05/10/2019   Procedure: RIGHT/LEFT HEART CATH AND CORONARY ANGIOGRAPHY;  Surgeon: Yvonne Kendall, MD;  Location: [facility] INVASIVE CV [facility];  Service: Cardiovascular;  Laterality: N/A;    SOCIAL HISTORY:   Social History   Tobacco Use  . Smoking status: Never Smoker  . Smokeless tobacco: Never Used  Substance Use Topics  . Alcohol use: Yes    Comment: 2 quarts a week     FAMILY HISTORY:   Family History  Problem Relation Age of Onset  . Hypertension Maternal Grandmother     DRUG ALLERGIES:   Allergies  Allergen Reactions  . Hydrocodone Itching    REVIEW OF SYSTEMS:   ROS As  per history of present illness. All pertinent systems were reviewed above. Constitutional, HEENT, cardiovascular, respiratory, GI, GU, musculoskeletal, neuro, psychiatric, endocrine, integumentary and hematologic systems were reviewed and are otherwise negative/unremarkable except for positive findings mentioned above in the HPI.   MEDICATIONS AT HOME:   Prior to Admission medications   Medication Sig Start Date End Date Taking? Authorizing Provider  atorvastatin (LIPITOR) 40 MG tablet Take 1 tablet (40 mg total) by mouth daily. 10/21/20 11/20/20  Noralee Stain, DO  carvedilol (COREG) 6.25 MG tablet Take 1 tablet (6.25 mg total) by mouth 2 (two) times daily with a meal. 10/21/20 11/20/20  Noralee Stain, DO  diclofenac sodium (VOLTAREN) 1 % GEL Apply 2 g topically 4 (four) times daily as needed (pain).    [provider]  gabapentin (NEURONTIN) 300 MG capsule Take 1 capsule (300 mg total) by mouth at bedtime. 10/21/20   Noralee Stain, DO  isosorbide-hydrALAZINE (BIDIL) 20-37.5 MG tablet Take 1 tablet by mouth 3 (three) times daily. 10/21/20   Noralee Stain, DO  pantoprazole (PROTONIX) 40 MG tablet Take 1 tablet (40 mg total) by mouth daily. 10/21/20   Noralee Stain, DO  potassium chloride SA (KLOR-CON) 20 MEQ tablet Take 1 tablet (20 mEq total) by mouth daily. 12/11/20   Nahser, Deloris Ping, MD  torsemide (DEMADEX) 20 MG tablet Take 1 tablet (20 mg total) by mouth 2 (two) times daily. 10/21/20   Noralee Stain, DO      VITAL SIGNS:  Blood pressure 124/84, pulse 97, temperature (!) 97.3 F (36.3 C), temperature source Oral, resp. rate (!) 21, SpO2 97 %.  PHYSICAL EXAMINATION:  Physical Exam  GENERAL:  60 y.o.-year-old African-American male patient lying in the bed with mild respiratory distress with conversational dyspnea. EYES: Pupils equal, round, reactive to light and accommodation. No scleral icterus. Extraocular muscles intact.  HEENT: Head atraumatic, normocephalic. Oropharynx and nasopharynx clear.  NECK:  Supple, no jugular venous distention. No thyroid enlargement, no tenderness.  LUNGS: Minimally diminished bibasal breath sounds with no wheezing, rales,rhonchi or crepitation. No use of accessory muscles of respiration.  CARDIOVASCULAR: Regular rate and rhythm, S1, S2 normal. No murmurs, rubs, or gallops.  ABDOMEN: Soft, nondistended, nontender. Bowel sounds present. No organomegaly or mass.  EXTREMITIES: 3+ bilateral lower extremity pitting edema up to his knees with no cyanosis, or clubbing.  He had 2+ presacral  edema. NEUROLOGIC: Cranial nerves II through XII are intact. Muscle strength 5/5 in all extremities. Sensation intact. Gait not checked.  PSYCHIATRIC: The patient is alert and oriented x 3.  Normal affect and good eye contact. SKIN: No obvious rash, lesion, or ulcer.   LABORATORY PANEL:   CBC Recent Labs  Lab 12/12/20 2302  WBC 5.6  HGB 10.8*  HCT 32.6*  PLT 291   ------------------------------------------------------------------------------------------------------------------  Chemistries  Recent Labs  Lab 12/13/20 0013  NA 138  K 3.3*  CL 104  CO2 24  GLUCOSE 115*  BUN 23*  CREATININE 1.29*  CALCIUM 8.3*  AST 23  ALT 20  ALKPHOS 100  BILITOT 1.0   ------------------------------------------------------------------------------------------------------------------  Cardiac Enzymes No results for input(s): TROPONINI in the last 168 hours. ------------------------------------------------------------------------------------------------------------------  RADIOLOGY:  DG Chest 2 View  Result Date: 12/12/2020 CLINICAL DATA:  Short of breath, bilateral lower extremity edema EXAM: CHEST - 2 VIEW COMPARISON:  11/20/2020 FINDINGS: Frontal and lateral views of the chest demonstrate stable enlarged cardiac silhouette. No airspace disease, effusion, or pneumothorax. No acute bony abnormalities. IMPRESSION: 1. Stable enlarged cardiac silhouette.  No acute airspace disease. Electronically Signed   By: Sharlet Salina M.D.   On: 12/12/2020 23:20      IMPRESSION AND PLAN:  1.  Acute on chronic combined systolic and diastolic CHF with associated anasarca. -The patient will be admitted to a progressive unit bed. -She will be aggressively diuresed with IV Lasix to replace his Demadex for now. -We will follow serial troponin I's. -We will monitor daily weights and ins and outs. -Potassium replacement to be provided. -We will start the patient on ACE inhibitor therapy given his low EF,  with small dose lisinopril that can be later titrated as tolerated. -We will continue beta-blocker therapy and vital.   -Patient had a recent 2D echo in November 2021 revealing an EF of 20% with grade 3 diastolic dysfunction, mild to moderate right and left atrial dilatation, mild mitral regurgitation and trivial aortic regurgitation. -His hypokalemia will be replaced and magnesium level will be checked.  2.  Essential hypertension. -We will continue Coreg and BiDil.  3.  Dyslipidemia. -We will continue statin therapy.  4.  GERD. -We will continue PPI therapy.  5.  Peripheral neuropathy. -We will continue Neurontin.  DVT prophylaxis: Lovenox.  Code Status: full code. Family Communication:  The plan of care was discussed in details with the patient requested no family notification at this time. I answered all questions. The patient agreed to proceed with the above mentioned plan. Further management will depend upon hospital course. Disposition Plan: Back to previous home environment Consults called: Cardiology consult.  Dr. Antoine Poche was notified. All the records are reviewed and case discussed with ED provider.  Status is: Inpatient  Remains inpatient appropriate because:Ongoing diagnostic testing needed not appropriate for outpatient work up, Unsafe d/c plan, IV treatments appropriate due to intensity of illness or inability to take PO and Inpatient level of care appropriate due to severity of illness   Dispo: The patient is from: Home              Anticipated d/c is to: Home              Anticipated d/c date is: 2 days              Patient currently is not medically stable to d/c.   Difficult to place patient No   TOTAL TIME TAKING CARE OF THIS PATIENT: 55 minutes.    Hannah Beat M.D on 12/13/2020 at 2:22 AM  [facility]   From 7 PM-7 AM, contact night-coverage [email or web address]  CC: Primary care physician; Nahser, Deloris Ping, MD

## 2020-12-13 NOTE — ED Notes (Signed)
Pt ate 100% of meal tray 

## 2020-12-13 NOTE — ED Notes (Signed)
Pt refused foley and condom catheter. Pt using urinal at bedside

## 2020-12-14 ENCOUNTER — Telehealth: Payer: Self-pay | Admitting: Internal Medicine

## 2020-12-14 ENCOUNTER — Inpatient Hospital Stay (HOSPITAL_COMMUNITY)
Admit: 2020-12-14 | Discharge: 2020-12-14 | Disposition: A | Discharge status: Home / Self Care | Payer: Medicaid Other | Attending: Physician Assistant | Admitting: Physician Assistant

## 2020-12-14 DIAGNOSIS — I5021 Acute systolic (congestive) heart failure: Secondary | ICD-10-CM | POA: Diagnosis not present

## 2020-12-14 DIAGNOSIS — I5023 Acute on chronic systolic (congestive) heart failure: Secondary | ICD-10-CM

## 2020-12-14 DIAGNOSIS — I340 Nonrheumatic mitral (valve) insufficiency: Secondary | ICD-10-CM | POA: Diagnosis not present

## 2020-12-14 DIAGNOSIS — I361 Nonrheumatic tricuspid (valve) insufficiency: Secondary | ICD-10-CM | POA: Diagnosis not present

## 2020-12-14 DIAGNOSIS — I25118 Atherosclerotic heart disease of native coronary artery with other forms of angina pectoris: Secondary | ICD-10-CM

## 2020-12-14 LAB — URINE DRUG SCREEN, QUALITATIVE (ARMC ONLY)
Amphetamines, Ur Screen: NOT DETECTED
Barbiturates, Ur Screen: NOT DETECTED
Benzodiazepine, Ur Scrn: NOT DETECTED
Cannabinoid 50 Ng, Ur ~~LOC~~: NOT DETECTED
Cocaine Metabolite,Ur ~~LOC~~: NOT DETECTED
MDMA (Ecstasy)Ur Screen: NOT DETECTED
Methadone Scn, Ur: NOT DETECTED
Opiate, Ur Screen: NOT DETECTED
Phencyclidine (PCP) Ur S: NOT DETECTED
Tricyclic, Ur Screen: NOT DETECTED

## 2020-12-14 LAB — ECHOCARDIOGRAM LIMITED
Calc EF: 13.3 %
Height: 71 in
S' Lateral: 6.6 cm
Single Plane A2C EF: 22.7 %
Single Plane A4C EF: 3.4 %
Weight: 4321.02 oz

## 2020-12-14 LAB — BASIC METABOLIC PANEL
Anion gap: 11 (ref 5–15)
BUN: 27 mg/dL — ABNORMAL HIGH (ref 6–20)
CO2: 26 mmol/L (ref 22–32)
Calcium: 9.1 mg/dL (ref 8.9–10.3)
Chloride: 102 mmol/L (ref 98–111)
Creatinine, Ser: 1.33 mg/dL — ABNORMAL HIGH (ref 0.61–1.24)
GFR, Estimated: >60 mL/min (ref >60)
Glucose, Bld: 106 mg/dL — ABNORMAL HIGH (ref 70–99)
Potassium: 3.6 mmol/L (ref 3.5–5.1)
Sodium: 139 mmol/L (ref 135–145)

## 2020-12-14 MED ORDER — POTASSIUM CHLORIDE CRYS ER 20 MEQ PO TBCR
40.0000 meq | EXTENDED_RELEASE_TABLET | Freq: Once | ORAL | Status: AC
  Administered 2020-12-14: 40 meq via ORAL
  Filled 2020-12-14: qty 2

## 2020-12-14 MED ORDER — ENOXAPARIN SODIUM 80 MG/0.8ML ~~LOC~~ SOLN
0.5000 mg/kg | SUBCUTANEOUS | Status: DC
  Administered 2020-12-14 – 2020-12-16 (×3): 62.5 mg via SUBCUTANEOUS
  Filled 2020-12-14 (×4): qty 0.8

## 2020-12-14 NOTE — Progress Notes (Signed)
Progress Note  Patient Name: Dale [name] Date of Encounter: 12/14/2020  Primary Cardiologist: Nahser  Subjective   Dyspnea, orthopnea, and edema persist. No chest pain. Documented UOP 3.5 L for the past 24 hours with a net - 4.9 L for the admission. Weight 123.5-->122.5 kg over the past 24 hours. Renal function stable.   Inpatient Medications    Scheduled Meds: . aspirin EC  81 mg Oral Daily  . atorvastatin  40 mg Oral Daily  . carvedilol  6.25 mg Oral BID WC  . enoxaparin (LOVENOX) injection  0.5 mg/kg Subcutaneous Q24H  . furosemide  80 mg Intravenous Q12H  . gabapentin  300 mg Oral QHS  . isosorbide-hydrALAZINE  1 tablet Oral TID  . pantoprazole  40 mg Oral Daily  . potassium chloride SA  20 mEq Oral Daily  . sodium chloride flush  3 mL Intravenous Q12H   Continuous Infusions:  PRN Meds: acetaminophen **OR** acetaminophen, magnesium hydroxide, ondansetron **OR** ondansetron (ZOFRAN) IV, traZODone   Vital Signs    Vitals:   12/13/20 2155 12/14/20 0208 12/14/20 0457 12/14/20 0746  BP: 107/88  (!) 141/95 113/87  Pulse: 87  79 78  Resp: 18  18 20   Temp: (!) 97.5 F (36.4 C)  97.8 F (36.6 C) 97.7 F (36.5 C)  TempSrc:   Oral Oral  SpO2: 93%  100% 97%  Weight:  122.5 kg    Height:        Intake/Output Summary (Last 24 hours) at 12/14/2020 1059 Last data filed at 12/14/2020 0950 Gross per 24 hour  Intake 843 ml  Output 1900 ml  Net -1057 ml   Filed Weights   12/13/20 1015 12/13/20 1527 12/14/20 0208  Weight: 113.8 kg 123.5 kg 122.5 kg    Telemetry    SR with PVCs and ventricular bigeminy - Personally Reviewed  ECG    NSR, 91 bpm, 1st degree AV block, rare PVC, baseline wandering, nonspecific lateral st/t changes - Personally Reviewed  Physical Exam   GEN: No acute distress.   Neck: No JVD. Cardiac: RRR, I/VI systolic murmur, no rubs, or gallops.  Respiratory: Diminished breath sounds bilaterally.  GI: Soft, nontender, non-distended.   MS:  2-3+ bilateral lower extremity edema to the thighs along with sacral edema; No deformity. Neuro:  Alert and oriented x 3; Nonfocal.  Psych: Normal affect.  Labs    Chemistry Recent Labs  Lab 12/13/20 0013 12/13/20 0532 12/14/20 0704  NA 138 140 139  K 3.3* 3.5 3.6  CL 104 106 102  CO2 24 25 26   GLUCOSE 115* 142* 106*  BUN 23* 24* 27*  CREATININE 1.29* 1.42* 1.33*  CALCIUM 8.3* 8.5* 9.1  PROT 6.9  --   --   ALBUMIN 3.3*  --   --   AST 23  --   --   ALT 20  --   --   ALKPHOS 100  --   --   BILITOT 1.0  --   --   GFRNONAA >60 58* >60  ANIONGAP 10 9 11      Hematology Recent Labs  Lab 12/12/20 2302 12/13/20 0532  WBC 5.6 6.1  RBC 4.13* 4.13*  HGB 10.8* 10.7*  HCT 32.6* 32.8*  MCV 78.9* 79.4*  MCH 26.2 25.9*  MCHC 33.1 32.6  RDW 17.1* 16.8*  PLT 291 301    Cardiac EnzymesNo results for input(s): TROPONINI in the last 168 hours. No results for input(s): TROPIPOC in the last 168 hours.  BNP Recent Labs  Lab 12/12/20 2302  BNP 3,111.3*     DDimer No results for input(s): DDIMER in the last 168 hours.   Radiology    DG Chest 2 View  Result Date: 12/12/2020 IMPRESSION: 1. Stable enlarged cardiac silhouette.  No acute airspace disease. Electronically Signed   By: Sharlet Salina M.D.   On: 12/12/2020 23:20    Cardiac Studies   Limited 2D echo 12/14/2020: Pending __________  2D echo 09/2020: 1. Left ventricular ejection fraction, by estimation, is approximately  20%. The left ventricle has severely decreased function. The left  ventricle demonstrates global hypokinesis with regional variation as  noted. The left ventricular internal cavity  size was moderately dilated. Left ventricular diastolic parameters are  consistent with Grade III diastolic dysfunction (restrictive). No obvious  mural thrombus noted with Definity contrast.  2. Right ventricular systolic function is moderately to severely reduced.  The right ventricular size is normal. There is  moderately elevated  pulmonary artery systolic pressure. The estimated right ventricular  systolic pressure is 52.0 mmHg.  3. Left atrial size was mild to moderately dilated.  4. Right atrial size was mild to moderately dilated.  5. The mitral valve is grossly normal. Mild mitral valve regurgitation.  6. The aortic valve is tricuspid. Aortic valve regurgitation is trivial.  7. The inferior vena cava is dilated in size with <50% respiratory  variability, suggesting right atrial pressure of 15 mmHg. __________  [facility] 05/2019: Conclusions: 1. Severe single-vessel coronary artery disease with chronic total occlusion of the proximal RCA. 2. Mild to moderate, non-obstructive coronary artery disease involving the LAD and LCx. 3. Moderately elevated left heart filling pressures. 4. Severely elevated right heart filling pressures. 5. Mild pulmonary hypertension. 6. Normal Fick cardiac output/index.  Recommendations: 1. 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  Patient will be transported back to [facility] after recovery from today's procedure. 2. Continue diuresis and optimization of evidence-based heart failure therapy.  I will restart furosemide 40 mg IV BID this afternoon. 3. Secondary prevention of coronary artery disease, including high-intensity statin therapy and avoidance of cocaine.   Patient Profile     60 y.o. 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  Assessment & Plan    1. Acute on chronic combined systolic and diastolic CHF with possible mixed ICM and NICM: -He remains significantly volume overloaded and reports a dry weight around 245 pounds with a current weight of 270  pounds -Continue IV Lasix 80 mg bid, consider escalating to tid dosing, will discuss with MD -He will need several more days of inpatient diuresis  -Echo pending -Coreg, BiDil and IV Lasix -Not on MRA with underlying CKD, consider starting this prior to discharge, after he has been adequately diuresed, if renal function allows at that time -Look to add SGLT2i prior to discharge or in outpatient follow up -CHF education -Daily weights -Strict I/O  2. CAD involving the native coronary arteries with elevated troponin: -Minimally elevated and flat tredning HS-Tn with a peak of 31, now down trending and not consistent with ACS -No indication for heparin gtt -ASA, Coreg, Lipitor  -Await echo as above -Currently, no plans for inpatient ischemic evaluation   3. HTN: -Blood pressure currently well controlled -Continue current medical  therapy as above  4. HLD: -LDL 111 in 09/2020 with goal LDL < 70 -PTA Lipitor, follow up as outpatient   5. Polysubstance use: -He denies EtOH use and reports he last used cocaine 2 weeks prior to his admission  -Check drug screen  -Complete cessation is recommended   6. CKD stage II: -Stable -Monitor with diuresis   7. Anemia: -Possibly of chronic disease -Relatively stable  8. PVCs: -Coreg as above -Replete potassium to goal 4.0 -Magnesium at goal -Possibly in the context of his cardiomyopathy, though could also be contributing to his cardiomyopathy    For questions or updates, please contact [facility] Please consult [email or web address] for contact info under Cardiology/STEMI.    Signed, Eula Listen, PA-C [facility] Pager: [phone] 12/14/2020, 10:59 AM

## 2020-12-14 NOTE — Progress Notes (Addendum)
PROGRESS NOTE    Dale [name]  ZDG:[id number] DOB: July 07, 1961 DOA: 12/12/2020 PCP: Vesta Mixer, MD   Brief Narrative: Taken from H&P Dale [name] is a 60 y.o. African-American male with medical history significant for combined systolic and diastolic CHF, cocaine abuse, hypertension and alcohol abuse, who presented to the emergency room with acute onset of worsening dyspnea with worsening lower extremity edema.  The patient was given about 20 pounds over the last week.  He admitted to orthopnea, paroxysmal nocturnal dyspnea as well as dry cough.  He has been having significant dyspnea on exertion even walking around the house. Patient has an history of cocaine use, last use was a week ago. Found to have positive troponin and BNP of 3111.  COVID-19 negative. Admitted for acute on chronic heart failure.  Subjective: Patient still cannot lay flat, per patient he is unable to get a good night sleep for the past many nights. Denies any chest pain.  Assessment & Plan:   Active Problems:   Acute systolic CHF (congestive heart failure) (HCC)  Acute on chronic HFrEF.  Patient with markedly volume overload and EF of 20%.  Cardiology was also consulted. 2 pound decrease in his weight to 270 pound, dry weight around 245 pound. More than 4 L output. Renal functions improving. Repeat echocardiogram done-pending results -Continue with Lasix IV, 80 mg twice daily. -Strict intake and output -Daily weight and BNP -He will need higher dose of torsemide on discharge -Continue Coreg -Continue with dual  Essential hypertension.  Blood pressure within goal. -Continue Coreg and BiDil  Dyslipidemia. -Continue statin  GERD. -Continue PPI  Peripheral neuropathy. -Continue Neurontin  Stage II obesity. Body mass index is 37.67 kg/m.  This will complicate overall prognosis.  Objective: Vitals:   12/14/20 0208 12/14/20 0457 12/14/20 0746 12/14/20 1139  BP:  (!) 141/95 113/87 98/74  Pulse:  79  78 81  Resp:  18 20 18   Temp:  97.8 F (36.6 C) 97.7 F (36.5 C) 97.8 F (36.6 C)  TempSrc:  Oral Oral Oral  SpO2:  100% 97%   Weight: 122.5 kg     Height:        Intake/Output Summary (Last 24 hours) at 12/14/2020 1423 Last data filed at 12/14/2020 1205 Gross per 24 hour  Intake 603 ml  Output 3350 ml  Net -2747 ml   Filed Weights   12/13/20 1015 12/13/20 1527 12/14/20 0208  Weight: 113.8 kg 123.5 kg 122.5 kg    Examination:  General. Well-developed, obese gentleman, in no acute distress. Pulmonary.  Lungs clear bilaterally, normal respiratory effort. CV.  Regular rate and rhythm, no JVD, rub or murmur. Abdomen.  Soft, nontender, nondistended, BS positive. CNS.  Alert and oriented x3.  No focal neurologic deficit. Extremities.  No edema, no cyanosis, pulses intact and symmetrical. Psychiatry.  Judgment and insight appears normal.   DVT prophylaxis: Lovenox Code Status: Full Family Communication: Discussed with patient Disposition Plan:  Status is: Inpatient  Remains inpatient appropriate because:Inpatient level of care appropriate due to severity of illness   Dispo: The patient is from: Home              Anticipated d/c is to: Home              Anticipated d/c date is: 2 days              Patient currently is not medically stable to d/c.   Difficult to place patient No  Level of care: Progressive Cardiac  Consultants:   Cardiology  Procedures:  Antimicrobials:   Data Reviewed: I have personally reviewed following labs and imaging studies  CBC: Recent Labs  Lab 12/12/20 2302 12/13/20 0532  WBC 5.6 6.1  NEUTROABS 3.7  --   HGB 10.8* 10.7*  HCT 32.6* 32.8*  MCV 78.9* 79.4*  PLT 291 301   Basic Metabolic Panel: Recent Labs  Lab 12/13/20 0013 12/13/20 0532 12/14/20 0704  NA 138 140 139  K 3.3* 3.5 3.6  CL 104 106 102  CO2 24 25 26   GLUCOSE 115* 142* 106*  BUN 23* 24* 27*  CREATININE 1.29* 1.42* 1.33*  CALCIUM 8.3* 8.5* 9.1   MG  --  2.0  --    GFR: Estimated Creatinine Clearance: 81.6 mL/min (A) (by C-G formula based on SCr of 1.33 mg/dL (H)). Liver Function Tests: Recent Labs  Lab 12/13/20 0013  AST 23  ALT 20  ALKPHOS 100  BILITOT 1.0  PROT 6.9  ALBUMIN 3.3*   No results for input(s): LIPASE, AMYLASE in the last 168 hours. No results for input(s): AMMONIA in the last 168 hours. Coagulation Profile: No results for input(s): INR, PROTIME in the last 168 hours. Cardiac Enzymes: No results for input(s): CKTOTAL, CKMB, CKMBINDEX, TROPONINI in the last 168 hours. BNP (last 3 results) No results for input(s): PROBNP in the last 8760 hours. HbA1C: No results for input(s): HGBA1C in the last 72 hours. CBG: No results for input(s): GLUCAP in the last 168 hours. Lipid Profile: No results for input(s): CHOL, HDL, LDLCALC, TRIG, CHOLHDL, LDLDIRECT in the last 72 hours. Thyroid Function Tests: Recent Labs    12/13/20 0532  TSH 1.037   Anemia Panel: No results for input(s): VITAMINB12, FOLATE, FERRITIN, TIBC, IRON, RETICCTPCT in the last 72 hours. Sepsis Labs: No results for input(s): PROCALCITON, LATICACIDVEN in the last 168 hours.  Recent Results (from the past 240 hour(s))  Resp Panel by RT-PCR (Flu A&B, Covid) Nasopharyngeal Swab     Status: None   Collection Time: 12/13/20  1:12 AM   Specimen: Nasopharyngeal Swab; Nasopharyngeal(NP) swabs in vial transport medium  Result Value Ref Range Status   SARS Coronavirus 2 by RT PCR NEGATIVE NEGATIVE Final    Comment: (NOTE) SARS-CoV-2 target nucleic acids are NOT DETECTED.  The SARS-CoV-2 RNA is generally detectable in upper respiratory specimens during the acute phase of infection. The lowest concentration of SARS-CoV-2 viral copies this assay can detect is 138 copies/mL. A negative result does not preclude SARS-Cov-2 infection and should not be used as the sole basis for treatment or other patient management decisions. A negative result may  occur with  improper specimen collection/handling, submission of specimen other than nasopharyngeal swab, presence of viral mutation(s) within the areas targeted by this assay, and inadequate number of viral copies(<138 copies/mL). A negative result must be combined with clinical observations, patient history, and epidemiological information. The expected result is Negative.  Fact Sheet for Patients:  02/10/21  Fact Sheet for Healthcare Providers:  BloggerCourse.com  This test is no t yet approved or cleared by the [place] FDA and  has been authorized for detection and/or diagnosis of SARS-CoV-2 by FDA under an Emergency Use Authorization (EUA). This EUA will remain  in effect (meaning this test can be used) for the duration of the COVID-19 declaration under Section 564(b)(1) of the Act, 21 U.S.C.section 360bbb-3(b)(1), unless the authorization is terminated  or revoked sooner.       Influenza A  by PCR NEGATIVE NEGATIVE Final   Influenza B by PCR NEGATIVE NEGATIVE Final    Comment: (NOTE) The Xpert Xpress SARS-CoV-2/FLU/RSV plus assay is intended as an aid in the diagnosis of influenza from Nasopharyngeal swab specimens and should not be used as a sole basis for treatment. Nasal washings and aspirates are unacceptable for Xpert Xpress SARS-CoV-2/FLU/RSV testing.  Fact Sheet for Patients: BloggerCourse.com  Fact Sheet for Healthcare Providers: SeriousBroker.it  This test is not yet approved or cleared by the [place] FDA and has been authorized for detection and/or diagnosis of SARS-CoV-2 by FDA under an Emergency Use Authorization (EUA). This EUA will remain in effect (meaning this test can be used) for the duration of the COVID-19 declaration under Section 564(b)(1) of the Act, 21 U.S.C. section 360bbb-3(b)(1), unless the authorization is terminated  or revoked.  Performed at [facility], [address] [place], [place] [address]      Radiology Studies: DG Chest 2 View  Result Date: 12/12/2020 CLINICAL DATA:  Short of breath, bilateral lower extremity edema EXAM: CHEST - 2 VIEW COMPARISON:  11/20/2020 FINDINGS: Frontal and lateral views of the chest demonstrate stable enlarged cardiac silhouette. No airspace disease, effusion, or pneumothorax. No acute bony abnormalities. IMPRESSION: 1. Stable enlarged cardiac silhouette.  No acute airspace disease. Electronically Signed   By: Sharlet Salina M.D.   On: 12/12/2020 23:20    Scheduled Meds: . aspirin EC  81 mg Oral Daily  . atorvastatin  40 mg Oral Daily  . carvedilol  6.25 mg Oral BID WC  . enoxaparin (LOVENOX) injection  0.5 mg/kg Subcutaneous Q24H  . furosemide  80 mg Intravenous Q12H  . gabapentin  300 mg Oral QHS  . isosorbide-hydrALAZINE  1 tablet Oral TID  . pantoprazole  40 mg Oral Daily  . potassium chloride SA  20 mEq Oral Daily  . sodium chloride flush  3 mL Intravenous Q12H   Continuous Infusions:   LOS: 1 day   Time spent: 30 minutes.  Arnetha Courser, MD [facility]  If 7PM-7AM, please contact night-coverage [email or web address]  12/14/2020, 2:23 PM   This record has been created using Conservation officer, historic buildings. Errors have been sought and corrected,but may not always be located. Such creation errors do not reflect on the standard of care.

## 2020-12-14 NOTE — Progress Notes (Signed)
   Heart failure Nurse Navigator Note  HFrEF 20-25%. Left ventricle with global hypokinesis. Left ventricle internal cavity is moderately dilated. Left ventricular diastolic dysfunction is grade 3. Ventricular systolic function is moderately to severely reduced. Mild to moderate biatrial enlargement. Mild mitral regurgitation.   Comorbidities:  Hypertension Hyperlipidemia Chronic kidney disease Anemia Morbid obesity Coronary artery disease   Has known history of substance and alcohol abuse.  Medication:  Aspirin 81 mg daily  Atorvastatin 40 mg daily Carvedilol 6.25 mg daily with meals Furosemide 80 mg IV every 12 hours Isosorbide-hydralazine 30/37.53 times a day Potassium chloride 20 mEq daily  Labs:  Sodium 139, potassium 3.6, chloride 102, CO2 26, BUN 27, creatinine 1.33 Intake 925 mL Output 4450 mL Blood pressure 113/87 Weight 122.5 kg BMI 37.67  Reds clip vest reading today 32 percent.   Assessment:   General-he is awake and alert sitting up in the edge of the bed. He states that he still is short of breath not lie flat, not sleep well because of that reason.   HEENT-pulls are equal, sclera nonicteric, no JVD noted.  Cardiac-heart tones of regular rate and rhythm.  Chest-breath sounds clear to posterior auscultation.  Abdomen is rounded, nontender  Musculoskeletal-feet and lower extremities remain edematous, pitting edema.  Psych-is pleasant and appropriate makes good eye contact.   Neurologic-speech is clear, moves all extremities without difficulty.   Spoke with patient today, he is  concerned about his edema. Discussed elevating his legs and feet as much as possible rather than to sit in the chair and leave them hanging down. I will also talk to his nurse about being fitted with Ted socks.  Also discussed cares when he goes home and what to report to physician.  He is also asking for help with making a medication schedule for him to be able to  follow.  Tresa Endo RN, CHFN

## 2020-12-14 NOTE — Telephone Encounter (Signed)
See note by JBrennan   Pt needs appt for reevaluation

## 2020-12-14 NOTE — Plan of Care (Signed)
  Problem: Education: Goal: Ability to demonstrate management of disease process will improve Outcome: Not Progressing Pt requesting multiple fluids to drink and "[place] tray with potato chips".  Cardiac diet and fluid restriction reviewed with patient.  Voiced understanding and compliance with fluid limitation and low salt foods.

## 2020-12-14 NOTE — Progress Notes (Signed)
*  PRELIMINARY RESULTS* Echocardiogram 2D Echocardiogram has been performed.  Cristela Blue 12/14/2020, 9:21 AM

## 2020-12-15 DIAGNOSIS — I5023 Acute on chronic systolic (congestive) heart failure: Secondary | ICD-10-CM | POA: Diagnosis not present

## 2020-12-15 LAB — BASIC METABOLIC PANEL
Anion gap: 8 (ref 5–15)
BUN: 30 mg/dL — ABNORMAL HIGH (ref 6–20)
CO2: 27 mmol/L (ref 22–32)
Calcium: 8.9 mg/dL (ref 8.9–10.3)
Chloride: 104 mmol/L (ref 98–111)
Creatinine, Ser: 1.44 mg/dL — ABNORMAL HIGH (ref 0.61–1.24)
GFR, Estimated: 57 mL/min — ABNORMAL LOW (ref >60)
Glucose, Bld: 112 mg/dL — ABNORMAL HIGH (ref 70–99)
Potassium: 3.8 mmol/L (ref 3.5–5.1)
Sodium: 139 mmol/L (ref 135–145)

## 2020-12-15 MED ORDER — FUROSEMIDE 10 MG/ML IJ SOLN
80.0000 mg | Freq: Every day | INTRAMUSCULAR | Status: DC
  Administered 2020-12-15: 80 mg via INTRAVENOUS
  Filled 2020-12-15: qty 8

## 2020-12-15 MED ORDER — LOSARTAN POTASSIUM 25 MG PO TABS
12.5000 mg | ORAL_TABLET | Freq: Every day | ORAL | Status: DC
  Administered 2020-12-16: 12.5 mg via ORAL
  Filled 2020-12-15 (×2): qty 1

## 2020-12-15 NOTE — Progress Notes (Signed)
Progress Note  Patient Name: Dale [name] Date of Encounter: 12/15/2020  Primary Cardiologist: Nahser  Subjective   Dyspnea, orthopnea, and edema persist, though are improving. No chest pain. Documented UOP 2.7 L for the past 24 hours with a net - 6.9 L for the admission. Weight 122.7-->121.3 kg over the past 24 hours. Renal function relatively stable.   Inpatient Medications    Scheduled Meds: . aspirin EC  81 mg Oral Daily  . atorvastatin  40 mg Oral Daily  . carvedilol  6.25 mg Oral BID WC  . enoxaparin (LOVENOX) injection  0.5 mg/kg Subcutaneous Q24H  . furosemide  80 mg Intravenous Daily  . gabapentin  300 mg Oral QHS  . isosorbide-hydrALAZINE  1 tablet Oral TID  . pantoprazole  40 mg Oral Daily  . potassium chloride SA  20 mEq Oral Daily  . sodium chloride flush  3 mL Intravenous Q12H   Continuous Infusions:  PRN Meds: acetaminophen **OR** acetaminophen, magnesium hydroxide, ondansetron **OR** ondansetron (ZOFRAN) IV, traZODone   Vital Signs    Vitals:   12/15/20 0416 12/15/20 0720 12/15/20 0741 12/15/20 1123  BP: 101/80 (!) 130/94  111/77  Pulse: 85 90  88  Resp: 17 18  (!) 23  Temp: 98.3 F (36.8 C) (!) 97.5 F (36.4 C)  97.8 F (36.6 C)  TempSrc:  Oral  Oral  SpO2: 100% 100%  100%  Weight: 122.7 kg  121.3 kg   Height:        Intake/Output Summary (Last 24 hours) at 12/15/2020 1130 Last data filed at 12/15/2020 1127 Gross per 24 hour  Intake 1880 ml  Output 3975 ml  Net -2095 ml   Filed Weights   12/14/20 0208 12/15/20 0416 12/15/20 0741  Weight: 122.5 kg 122.7 kg 121.3 kg    Telemetry    SR with sinus bradycardia, rare junctional escape beat, 4 beat run of NSVT - Personally Reviewed  ECG    No new tracing - Personally Reviewed  Physical Exam   GEN: No acute distress.   Neck: JVD elevated to the angle of the mandible. Cardiac: RRR, I/VI systolic murmur, no rubs, or gallops.  Respiratory: Diminished breath sounds bilaterally.  GI:  Soft, nontender, non-distended.   MS: 2-3+ bilateral lower extremity edema to the thighs along with sacral edema; No deformity.  Compression stockings noted. Neuro:  Alert and oriented x 3; Nonfocal.  Psych: Normal affect.  Labs    Chemistry Recent Labs  Lab 12/13/20 0013 12/13/20 0532 12/14/20 0704 12/15/20 0450  NA 138 140 139 139  K 3.3* 3.5 3.6 3.8  CL 104 106 102 104  CO2 24 25 26 27   GLUCOSE 115* 142* 106* 112*  BUN 23* 24* 27* 30*  CREATININE 1.29* 1.42* 1.33* 1.44*  CALCIUM 8.3* 8.5* 9.1 8.9  PROT 6.9  --   --   --   ALBUMIN 3.3*  --   --   --   AST 23  --   --   --   ALT 20  --   --   --   ALKPHOS 100  --   --   --   BILITOT 1.0  --   --   --   GFRNONAA >60 58* >60 57*  ANIONGAP 10 9 11 8      Hematology Recent Labs  Lab 12/12/20 2302 12/13/20 0532  WBC 5.6 6.1  RBC 4.13* 4.13*  HGB 10.8* 10.7*  HCT 32.6* 32.8*  MCV 78.9* 79.4*  MCH 26.2 25.9*  MCHC 33.1 32.6  RDW 17.1* 16.8*  PLT 291 301    Cardiac EnzymesNo results for input(s): TROPONINI in the last 168 hours. No results for input(s): TROPIPOC in the last 168 hours.   BNP Recent Labs  Lab 12/12/20 2302  BNP 3,111.3*     DDimer No results for input(s): DDIMER in the last 168 hours.   Radiology    DG Chest 2 View  Result Date: 12/12/2020 IMPRESSION: 1. Stable enlarged cardiac silhouette.  No acute airspace disease. Electronically Signed   By: Sharlet Salina M.D.   On: 12/12/2020 23:20    Cardiac Studies   Limited 2D echo 12/14/2020: 1. Left ventricular ejection fraction, by estimation, is <20%. The left  ventricle has severely decreased function. The left ventricle demonstrates  global hypokinesis. The left ventricular internal cavity size was severely  dilated. Left ventricular  diastolic parameters are indeterminate.  2. Right ventricular systolic function is moderately reduced. The right  ventricular size is mildly enlarged. There is normal pulmonary artery  systolic pressure.   3. The mitral valve is normal in structure. Mild mitral valve  regurgitation.  __________  2D echo 09/2020: 1. Left ventricular ejection fraction, by estimation, is approximately  20%. The left ventricle has severely decreased function. The left  ventricle demonstrates global hypokinesis with regional variation as  noted. The left ventricular internal cavity  size was moderately dilated. Left ventricular diastolic parameters are  consistent with Grade III diastolic dysfunction (restrictive). No obvious  mural thrombus noted with Definity contrast.  2. Right ventricular systolic function is moderately to severely reduced.  The right ventricular size is normal. There is moderately elevated  pulmonary artery systolic pressure. The estimated right ventricular  systolic pressure is 52.0 mmHg.  3. Left atrial size was mild to moderately dilated.  4. Right atrial size was mild to moderately dilated.  5. The mitral valve is grossly normal. Mild mitral valve regurgitation.  6. The aortic valve is tricuspid. Aortic valve regurgitation is trivial.  7. The inferior vena cava is dilated in size with <50% respiratory  variability, suggesting right atrial pressure of 15 mmHg. __________  [facility] 05/2019: Conclusions: 1. Severe single-vessel coronary artery disease with chronic total occlusion of the proximal RCA. 2. Mild to moderate, non-obstructive coronary artery disease involving the LAD and LCx. 3. Moderately elevated left heart filling pressures. 4. Severely elevated right heart filling pressures. 5. Mild pulmonary hypertension. 6. Normal Fick cardiac output/index.  Recommendations: 1. 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  Patient will be transported back to [facility] after recovery from today's procedure. 2. Continue diuresis and optimization of evidence-based heart failure therapy.  I will restart furosemide 40 mg IV BID this  afternoon. 3. Secondary prevention of coronary artery disease, including high-intensity statin therapy and avoidance of cocaine.   Patient Profile     60 y.o. 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  Assessment & Plan    1. Acute on chronic combined systolic and diastolic CHF with possible mixed ICM and NICM: -He remains significantly volume overloaded and reports a dry weight around 245 pounds with a current weight of 267 pounds -Continue IV Lasix 80 mg bid -He will need several more days of inpatient diuresis  -  Limited echo this admission with an EF less than 20% largely consistent with prior study -Coreg, BiDil and IV Lasix -Not on MRA with underlying CKD, consider starting this prior to discharge, after he has been adequately diuresed, if renal function allows at that time -Look to add SGLT2i prior to discharge or in outpatient follow up -CHF education -Daily weights -Strict I/O  2. CAD involving the native coronary arteries with elevated troponin: -Minimally elevated and flat tredning HS-Tn with a peak of 31, now down trending and not consistent with ACS -No indication for heparin gtt -ASA, Coreg, Lipitor  -Await echo as above -Currently, no plans for inpatient ischemic evaluation   3. HTN: -Blood pressure currently well controlled -Continue current medical therapy as above  4. HLD: -LDL 111 in 09/2020 with goal LDL < 70 -PTA Lipitor, follow up as outpatient   5. Polysubstance use: -He denies EtOH use and reports he last used cocaine 2 weeks prior to his admission  -Drug screen negative this admission -Complete cessation is recommended   6. CKD stage II: -Stable -Monitor with diuresis   7. Anemia: -Possibly of  chronic disease -Relatively stable  8. PVCs/NSVT: -Coreg as above -Potassium improving -Magnesium at goal -Possibly in the context of his cardiomyopathy, though could also be contributing to his cardiomyopathy    For questions or updates, please contact [facility] Please consult [email or web address] for contact info under Cardiology/STEMI.    Signed, Eula Listen, PA-C [facility] Pager: [phone] 12/15/2020, 11:30 AM

## 2020-12-15 NOTE — Progress Notes (Signed)
PROGRESS NOTE    Dale [name]  NID:[id number] DOB: [birth year] DOA: 12/12/2020 PCP: Vesta Mixer, MD   Brief Narrative: Taken from H&P Dale [name] is a 60 y.o. African-American male with medical history significant for combined systolic and diastolic CHF, cocaine abuse, hypertension and alcohol abuse, who presented to the emergency room with acute onset of worsening dyspnea with worsening lower extremity edema.  The patient was given about 20 pounds over the last week.  He admitted to orthopnea, paroxysmal nocturnal dyspnea as well as dry cough.  He has been having significant dyspnea on exertion even walking around the house. Patient has an history of cocaine use, last use was a week ago. Found to have positive troponin and BNP of 3111.  COVID-19 negative. Admitted for acute on chronic heart failure, severely reduced a EF of less than 20%.  Subjective: Patient seems improving, able to sleep in his bed last night.  Still quite volume overload.  Assessment & Plan:   Active Problems:   Acute systolic CHF (congestive heart failure) (HCC)  Acute on chronic HFrEF.  Patient with markedly volume overload and EF of 20%.  Cardiology was also consulted.  Weight at 267 pound, dry weight around 245 pound. More than 4 L output. Renal functions with small increase in creatinine. Repeat echocardiogram with dilated cardiomyopathy with severely reduced EF of less than 20%. -Continue with Lasix IV, 80 mg twice daily, will hold afternoon dose today due to creatinine increase. -Strict intake and output -Daily weight and BNP -He will need higher dose of torsemide on discharge -Continue Coreg -Continue with dual  Essential hypertension.  Blood pressure within goal. -Continue Coreg and BiDil  Dyslipidemia. -Continue statin  GERD. -Continue PPI  Peripheral neuropathy. -Continue Neurontin  Stage II obesity. Body mass index is 37.31 kg/m.  This will complicate overall  prognosis.  Objective: Vitals:   12/15/20 0720 12/15/20 0741 12/15/20 1123 12/15/20 1530  BP: (!) 130/94  111/77 107/71  Pulse: 90  88 86  Resp: 18  (!) 23 17  Temp: (!) 97.5 F (36.4 C)  97.8 F (36.6 C) 97.9 F (36.6 C)  TempSrc: Oral  Oral   SpO2: 100%  100% 96%  Weight:  121.3 kg    Height:        Intake/Output Summary (Last 24 hours) at 12/15/2020 1613 Last data filed at 12/15/2020 1531 Gross per 24 hour  Intake 1640 ml  Output 4350 ml  Net -2710 ml   Filed Weights   12/14/20 0208 12/15/20 0416 12/15/20 0741  Weight: 122.5 kg 122.7 kg 121.3 kg    Examination:  General.  Obese gentleman, in no acute distress. Pulmonary.  Lungs clear bilaterally, normal respiratory effort. CV.  Regular rate and rhythm, no JVD, rub or murmur. Abdomen.  Soft, nontender, nondistended, BS positive. CNS.  Alert and oriented x3.  No focal neurologic deficit. Extremities.  2+ LE edema, no cyanosis, pulses intact and symmetrical. Psychiatry.  Judgment and insight appears normal.   DVT prophylaxis: Lovenox Code Status: Full Family Communication: Discussed with patient Disposition Plan:  Status is: Inpatient  Remains inpatient appropriate because:Inpatient level of care appropriate due to severity of illness   Dispo: The patient is from: Home              Anticipated d/c is to: Home              Anticipated d/c date is: 2 days  Patient currently is not medically stable to d/c.   Difficult to place patient No              Level of care: Progressive Cardiac  Consultants:   Cardiology  Procedures:  Antimicrobials:   Data Reviewed: I have personally reviewed following labs and imaging studies  CBC: Recent Labs  Lab 12/12/20 2302 12/13/20 0532  WBC 5.6 6.1  NEUTROABS 3.7  --   HGB 10.8* 10.7*  HCT 32.6* 32.8*  MCV 78.9* 79.4*  PLT 291 301   Basic Metabolic Panel: Recent Labs  Lab 12/13/20 0013 12/13/20 0532 12/14/20 0704 12/15/20 0450  NA 138 140 139  139  K 3.3* 3.5 3.6 3.8  CL 104 106 102 104  CO2 24 25 26 27   GLUCOSE 115* 142* 106* 112*  BUN 23* 24* 27* 30*  CREATININE 1.29* 1.42* 1.33* 1.44*  CALCIUM 8.3* 8.5* 9.1 8.9  MG  --  2.0  --   --    GFR: Estimated Creatinine Clearance: 75 mL/min (A) (by C-G formula based on SCr of 1.44 mg/dL (H)). Liver Function Tests: Recent Labs  Lab 12/13/20 0013  AST 23  ALT 20  ALKPHOS 100  BILITOT 1.0  PROT 6.9  ALBUMIN 3.3*   No results for input(s): LIPASE, AMYLASE in the last 168 hours. No results for input(s): AMMONIA in the last 168 hours. Coagulation Profile: No results for input(s): INR, PROTIME in the last 168 hours. Cardiac Enzymes: No results for input(s): CKTOTAL, CKMB, CKMBINDEX, TROPONINI in the last 168 hours. BNP (last 3 results) No results for input(s): PROBNP in the last 8760 hours. HbA1C: No results for input(s): HGBA1C in the last 72 hours. CBG: No results for input(s): GLUCAP in the last 168 hours. Lipid Profile: No results for input(s): CHOL, HDL, LDLCALC, TRIG, CHOLHDL, LDLDIRECT in the last 72 hours. Thyroid Function Tests: Recent Labs    12/13/20 0532  TSH 1.037   Anemia Panel: No results for input(s): VITAMINB12, FOLATE, FERRITIN, TIBC, IRON, RETICCTPCT in the last 72 hours. Sepsis Labs: No results for input(s): PROCALCITON, LATICACIDVEN in the last 168 hours.  Recent Results (from the past 240 hour(s))  Resp Panel by RT-PCR (Flu A&B, Covid) Nasopharyngeal Swab     Status: None   Collection Time: 12/13/20  1:12 AM   Specimen: Nasopharyngeal Swab; Nasopharyngeal(NP) swabs in vial transport medium  Result Value Ref Range Status   SARS Coronavirus 2 by RT PCR NEGATIVE NEGATIVE Final    Comment: (NOTE) SARS-CoV-2 target nucleic acids are NOT DETECTED.  The SARS-CoV-2 RNA is generally detectable in upper respiratory specimens during the acute phase of infection. The lowest concentration of SARS-CoV-2 viral copies this assay can detect is 138  copies/mL. A negative result does not preclude SARS-Cov-2 infection and should not be used as the sole basis for treatment or other patient management decisions. A negative result may occur with  improper specimen collection/handling, submission of specimen other than nasopharyngeal swab, presence of viral mutation(s) within the areas targeted by this assay, and inadequate number of viral copies(<138 copies/mL). A negative result must be combined with clinical observations, patient history, and epidemiological information. The expected result is Negative.  Fact Sheet for Patients:  BloggerCourse.com  Fact Sheet for Healthcare Providers:  SeriousBroker.it  This test is no t yet approved or cleared by the [place] FDA and  has been authorized for detection and/or diagnosis of SARS-CoV-2 by FDA under an Emergency Use Authorization (EUA). This EUA will remain  in effect (meaning this test can be used) for the duration of the COVID-19 declaration under Section 564(b)(1) of the Act, 21 U.S.C.section 360bbb-3(b)(1), unless the authorization is terminated  or revoked sooner.       Influenza A by PCR NEGATIVE NEGATIVE Final   Influenza B by PCR NEGATIVE NEGATIVE Final    Comment: (NOTE) The Xpert Xpress SARS-CoV-2/FLU/RSV plus assay is intended as an aid in the diagnosis of influenza from Nasopharyngeal swab specimens and should not be used as a sole basis for treatment. Nasal washings and aspirates are unacceptable for Xpert Xpress SARS-CoV-2/FLU/RSV testing.  Fact Sheet for Patients: BloggerCourse.com  Fact Sheet for Healthcare Providers: SeriousBroker.it  This test is not yet approved or cleared by the [place] FDA and has been authorized for detection and/or diagnosis of SARS-CoV-2 by FDA under an Emergency Use Authorization (EUA). This EUA will remain in effect (meaning  this test can be used) for the duration of the COVID-19 declaration under Section 564(b)(1) of the Act, 21 U.S.C. section 360bbb-3(b)(1), unless the authorization is terminated or revoked.  Performed at [facility], [address] [place], [place] [address]      Radiology Studies: ECHOCARDIOGRAM LIMITED  Result Date: 12/14/2020    ECHOCARDIOGRAM LIMITED REPORT   Patient Name:   RAYYAN BURLEY Date of Exam: 12/14/2020 Medical Rec #:  [id number]     Height:       71.0 in Accession #:    [id number]    Weight:       270.1 lb Date of Birth:  1961/06/19     BSA:          2.396 m Patient Age:    59 years      BP:           113/87 mmHg Patient Gender: M             HR:           78 bpm. Exam Location:  [facility] Procedure: Limited Echo, Cardiac Doppler and Color Doppler Indications:     CHF I50.9  History:         Patient has prior history of Echocardiogram examinations, most                  recent 09/09/2020. Risk Factors:Hypertension.  Sonographer:     Cristela Blue RDCS (AE) Referring Phys:  [id number] Lennon Alstrom Diagnosing Phys: Julien Nordmann MD IMPRESSIONS  1. Left ventricular ejection fraction, by estimation, is <20%. The left ventricle has severely decreased function. The left ventricle demonstrates global hypokinesis. The left ventricular internal cavity size was severely dilated. Left ventricular diastolic parameters are indeterminate.  2. Right ventricular systolic function is moderately reduced. The right ventricular size is mildly enlarged. There is normal pulmonary artery systolic pressure.  3. The mitral valve is normal in structure. Mild mitral valve regurgitation. FINDINGS  Left Ventricle: Left ventricular ejection fraction, by estimation, is <20%. The left ventricle has severely decreased function. The left ventricle demonstrates global hypokinesis. The left ventricular internal cavity size was severely dilated. There is no left ventricular hypertrophy. Left ventricular diastolic  parameters are indeterminate. Right Ventricle: The right ventricular size is mildly enlarged. No increase in right ventricular wall thickness. Right ventricular systolic function is moderately reduced. There is normal pulmonary artery systolic pressure. The tricuspid regurgitant velocity is 2.35 m/s, and with an assumed right atrial pressure of 5 mmHg, the estimated right ventricular systolic pressure is 27.1 mmHg. Left Atrium: Left atrial  size was normal in size. Right Atrium: Right atrial size was normal in size. Pericardium: There is no evidence of pericardial effusion. Mitral Valve: The mitral valve is normal in structure. Mild mitral valve regurgitation. No evidence of mitral valve stenosis. Tricuspid Valve: The tricuspid valve is normal in structure. Tricuspid valve regurgitation is mild . No evidence of tricuspid stenosis. Aortic Valve: The aortic valve is normal in structure. Aortic valve regurgitation is not visualized. No aortic stenosis is present. Pulmonic Valve: The pulmonic valve was normal in structure. Pulmonic valve regurgitation is not visualized. No evidence of pulmonic stenosis. Aorta: The aortic root is normal in size and structure. Venous: The inferior vena cava is normal in size with greater than 50% respiratory variability, suggesting right atrial pressure of 3 mmHg. IAS/Shunts: No atrial level shunt detected by color flow Doppler. LEFT VENTRICLE PLAX 2D LVIDd:         6.72 cm LVIDs:         6.60 cm LV PW:         1.83 cm LV IVS:        1.07 cm LVOT diam:     2.30 cm LVOT Area:     4.15 cm  LV Volumes (MOD) LV vol d, MOD A2C: 330.0 ml LV vol d, MOD A4C: 232.0 ml LV vol s, MOD A2C: 255.0 ml LV vol s, MOD A4C: 224.0 ml LV SV MOD A2C:     75.0 ml LV SV MOD A4C:     232.0 ml LV SV MOD BP:      36.8 ml LEFT ATRIUM         Index LA diam:    4.30 cm 1.79 cm/m                        PULMONIC VALVE AORTA                 PV Vmax:        0.75 m/s Ao Root diam: 4.00 cm PV Peak grad:   2.2 mmHg                        RVOT Peak grad: 3 mmHg  TRICUSPID VALVE TR Peak grad:   22.1 mmHg TR Vmax:        235.00 cm/s  SHUNTS Systemic Diam: 2.30 cm Julien Nordmann MD Electronically signed by Julien Nordmann MD Signature Date/Time: 12/14/2020/3:13:03 PM    Final     Scheduled Meds: . aspirin EC  81 mg Oral Daily  . atorvastatin  40 mg Oral Daily  . carvedilol  6.25 mg Oral BID WC  . enoxaparin (LOVENOX) injection  0.5 mg/kg Subcutaneous Q24H  . furosemide  80 mg Intravenous Daily  . gabapentin  300 mg Oral QHS  . isosorbide-hydrALAZINE  1 tablet Oral TID  . losartan  12.5 mg Oral Daily  . pantoprazole  40 mg Oral Daily  . potassium chloride SA  20 mEq Oral Daily  . sodium chloride flush  3 mL Intravenous Q12H   Continuous Infusions:   LOS: 2 days   Time spent: 25 minutes.  Arnetha Courser, MD [facility]  If 7PM-7AM, please contact night-coverage [email or web address]  12/15/2020, 4:13 PM   This record has been created using Conservation officer, historic buildings. Errors have been sought and corrected,but may not always be located. Such creation errors do not reflect on the standard of care.

## 2020-12-15 NOTE — TOC Initial Note (Signed)
Transition of Care [facility]) - Initial/Assessment Note    Patient Details  Name: Dale [name] MRN: [id number] Date of Birth: Dec 31, 1960  Transition of Care [facility]) CM/SW Contact:    Hetty Ely, RN Phone Number: 12/15/2020, 3:49 PM  Clinical Narrative: Spoke with patient, who was alert and oriented x3, cooperative, voices being homeless for 10 years due to Cocaine abuse. Lives wherever he can, however dislike the shelters. Currently in [place] was at daughters house when he developed bilateral lower extremity swelling. Do have medications  Locked up in the Hospital ED. Approved for [facility] Program in [place] will go there when discharged. Tired of being homeless, however due to criminal history it's difficult to find housing. Social worker is Ms. Annice Pih of DSS in [place]. Do keep Cardiology appointments, uses [employer] however unable to shop and prepare meals due to homeless situation.                  Expected Discharge Plan: Homeless Shelter Barriers to Discharge: Continued Medical Work up   Patient Goals and CMS Choice Patient states their goals for this hospitalization and ongoing recovery are:: Patient states he will be going to [facility] Recovery program for Cocaine abuse.   Choice offered to / list presented to : NA  Expected Discharge Plan and Services Expected Discharge Plan: Homeless Shelter In-house Referral: Clinical Social Work Discharge Planning Services: NA Post Acute Care Choice: IP Rehab Living arrangements for the past 2 months: Homeless Shelter                 DME Arranged: N/A DME Agency: NA       HH Arranged: NA HH Agency: NA        Prior Living Arrangements/Services Living arrangements for the past 2 months: Homeless Shelter Lives with:: Other (Comment) (Homeless) Patient language and need for interpreter reviewed:: Yes Do you feel safe going back to the place where you live?: No   Been Homeless for 10 years due to Drug use.  Need for Family  Participation in Patient Care: Yes (Comment) Care giver support system in place?: No (comment)   Criminal Activity/Legal Involvement Pertinent to Current Situation/Hospitalization: No - Comment as needed  Activities of Daily Living Home Assistive Devices/Equipment: Cane (specify quad or straight) ADL Screening (condition at time of admission) Patient's cognitive ability adequate to safely complete daily activities?: Yes Is the patient deaf or have difficulty hearing?: No Does the patient have difficulty seeing, even when wearing glasses/contacts?: No Does the patient have difficulty concentrating, remembering, or making decisions?: No Patient able to express need for assistance with ADLs?: Yes Does the patient have difficulty dressing or bathing?: No Independently performs ADLs?: No Communication: Independent Dressing (OT): Independent Grooming: Independent Feeding: Independent Bathing: Independent Toileting: Independent In/Out Bed: Independent Walks in Home: Independent Does the patient have difficulty walking or climbing stairs?: No Weakness of Legs: None Weakness of Arms/Hands: None  Permission Sought/Granted Permission sought to share information with : Case Manager Permission granted to share information with : No              Emotional Assessment Appearance:: Appears older than stated age Attitude/Demeanor/Rapport: Engaged,Self-Confident Affect (typically observed): Appropriate Orientation: : Oriented to Self,Oriented to Place,Oriented to  Time,Oriented to Situation Alcohol / Substance Use: Illicit Drugs Psych Involvement: No (comment)  Admission diagnosis:  Acute CHF (congestive heart failure) (HCC) [I50.9] Acute on chronic systolic congestive heart failure (HCC) [I50.23] Patient Active Problem List   Diagnosis Date Noted  . Coronary  artery disease involving native coronary artery of native heart without angina pectoris   . AKI (acute kidney injury) (HCC)  10/16/2020  . 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 Acute on chronic combined systolic (congestive) and diastolic (congestive) heart failure (HCC)   . Acute CHF (congestive heart failure) (HCC) 05/07/2019  . Nausea & vomiting 05/07/2019  . Acute exacerbation of CHF (congestive heart failure) (HCC) 03/11/2019  . Troponin level elevated 03/11/2019  . Hyperlipidemia 03/03/2019  . NSVT (nonsustained ventricular tachycardia) (HCC) 03/03/2019  . Anxiety and depression   . NSTEMI (non-ST elevated myocardial infarction) (HCC) 02/28/2019  . Acute systolic heart failure (HCC) 11/08/2018  . Hypertensive urgency 09/28/2018  . Acute pulmonary edema (HCC) 09/28/2018  . Cocaine abuse (HCC) 09/28/2018  . Substance abuse (HCC) 09/28/2018  . Chronic kidney disease, stage II (mild) 09/28/2018  . Normochromic anemia 09/28/2018  . Acute systolic CHF (congestive heart failure) (HCC) 09/27/2018  . Essential hypertension 03/11/2016  . Obesity 03/11/2016  . Glucosuria 03/11/2016   PCP:  Vesta Mixer, MD Pharmacy:   [facility] [address], [place] - [address] [place] [address] Phone: [phone] Fax: [phone]  [facility] [address] [place] - [address] [place] [address] Phone: [phone] Fax: [phone]     Social Determinants of Health (SDOH) Interventions    Readmission Risk Interventions Readmission Risk Prevention Plan 12/15/2020 09/11/2020 03/03/2019  Transportation Screening Complete Complete Complete  PCP or Specialist Appt within 3-5 Days - Complete -  HRI or Home Care Consult - Complete -  Social Work Consult for Recovery Care Planning/Counseling - Complete -  Palliative Care Screening - Complete -  Medication Review Oceanographer) Complete Complete Complete  PCP or Specialist appointment within 3-5 days of  discharge Complete - Complete  HRI or Home Care Consult Complete - (No Data)  SW Recovery Care/Counseling Consult Complete - Complete  Palliative Care Screening Not Applicable - Not Applicable  Skilled Nursing Facility Not Applicable - Not Applicable  Some recent data might be hidden

## 2020-12-16 DIAGNOSIS — I5023 Acute on chronic systolic (congestive) heart failure: Secondary | ICD-10-CM | POA: Diagnosis not present

## 2020-12-16 LAB — BASIC METABOLIC PANEL
Anion gap: 12 (ref 5–15)
BUN: 32 mg/dL — ABNORMAL HIGH (ref 6–20)
CO2: 24 mmol/L (ref 22–32)
Calcium: 9.1 mg/dL (ref 8.9–10.3)
Chloride: 103 mmol/L (ref 98–111)
Creatinine, Ser: 1.47 mg/dL — ABNORMAL HIGH (ref 0.61–1.24)
GFR, Estimated: 55 mL/min — ABNORMAL LOW (ref >60)
Glucose, Bld: 103 mg/dL — ABNORMAL HIGH (ref 70–99)
Potassium: 4.2 mmol/L (ref 3.5–5.1)
Sodium: 139 mmol/L (ref 135–145)

## 2020-12-16 LAB — CBC
HCT: 33 % — ABNORMAL LOW (ref 39.0–52.0)
Hemoglobin: 10.8 g/dL — ABNORMAL LOW (ref 13.0–17.0)
MCH: 25.8 pg — ABNORMAL LOW (ref 26.0–34.0)
MCHC: 32.7 g/dL (ref 30.0–36.0)
MCV: 78.8 fL — ABNORMAL LOW (ref 80.0–100.0)
Platelets: 294 10*3/uL (ref 150–400)
RBC: 4.19 MIL/uL — ABNORMAL LOW (ref 4.22–5.81)
RDW: 16.9 % — ABNORMAL HIGH (ref 11.5–15.5)
WBC: 5.9 10*3/uL (ref 4.0–10.5)
nRBC: 0 % (ref 0.0–0.2)

## 2020-12-16 MED ORDER — FUROSEMIDE 10 MG/ML IJ SOLN
80.0000 mg | Freq: Two times a day (BID) | INTRAMUSCULAR | Status: DC
  Administered 2020-12-16 (×2): 80 mg via INTRAVENOUS
  Filled 2020-12-16 (×3): qty 8

## 2020-12-16 NOTE — Progress Notes (Signed)
Patient respiratory rate 34, appears labored.  Patient reports that he is slightly SOB but no worse than usual "I always do this especially when I'm laying."  MD notified. There is an order to increase furosemide IV 80mg  to BID.

## 2020-12-16 NOTE — Progress Notes (Signed)
PROGRESS NOTE    Dale [name]  ZCH:[id number] DOB: 09/05/1961 DOA: 12/12/2020 PCP: Vesta Mixer, MD   Brief Narrative: Taken from H&P Dale [name] is a 60 y.o. African-American male with medical history significant for combined systolic and diastolic CHF, cocaine abuse, hypertension and alcohol abuse, who presented to the emergency room with acute onset of worsening dyspnea with worsening lower extremity edema.  The patient was given about 20 pounds over the last week.  He admitted to orthopnea, paroxysmal nocturnal dyspnea as well as dry cough.  He has been having significant dyspnea on exertion even walking around the house. Patient has an history of cocaine use, last use was a week ago. Found to have positive troponin and BNP of 3111.  COVID-19 negative. Admitted for acute on chronic heart failure, severely reduced a EF of less than 20%.  Subjective: Patient has no new complaint.  Assessment & Plan:   Active Problems:   Acute systolic CHF (congestive heart failure) (HCC)  Acute on chronic HFrEF.  Patient with markedly volume overload and EF of 20%.  Cardiology was also consulted.  Weight increased again today doubted it is corrected, dry weight around 245 pound.  Net negative of more than 8L. Renal functions with small increase in creatinine. Repeat echocardiogram with dilated cardiomyopathy with severely reduced EF of less than 20%. -Continue with Lasix IV, 80 mg twice daily -Strict intake and output -Daily weight and BMP -He will need higher dose of torsemide on discharge -Continue Coreg -Continue with Bidil  Essential hypertension.  Blood pressure within goal. -Continue Coreg and BiDil  Dyslipidemia. -Continue statin  GERD. -Continue PPI  Peripheral neuropathy. -Continue Neurontin  Stage II obesity. Body mass index is 37.52 kg/m.  This will complicate overall prognosis.  Objective: Vitals:   12/16/20 0829 12/16/20 0859 12/16/20 1151 12/16/20 1335  BP: (!)  126/95 (!) 138/56 93/69 107/74  Pulse: 93 90 88 85  Resp: 18 20 (!) 24 (!) 24  Temp: 98.6 F (37 C) 97.9 F (36.6 C) 97.6 F (36.4 C) 98 F (36.7 C)  TempSrc: Oral Oral Oral   SpO2: 95% 98% 97% 96%  Weight:      Height:        Intake/Output Summary (Last 24 hours) at 12/16/2020 1343 Last data filed at 12/16/2020 0911 Gross per 24 hour  Intake 380 ml  Output 1150 ml  Net -770 ml   Filed Weights   12/15/20 0416 12/15/20 0741 12/16/20 0351  Weight: 122.7 kg 121.3 kg 122 kg    Examination:  General.  Well-developed gentleman, in no acute distress. Pulmonary.  Lungs clear bilaterally, normal respiratory effort. CV.  Regular rate and rhythm, no JVD, rub or murmur. Abdomen.  Soft, nontender, nondistended, BS positive. CNS.  Alert and oriented x3.  No focal neurologic deficit. Extremities.  2+ LE edema, no cyanosis, pulses intact and symmetrical. Psychiatry.  Judgment and insight appears normal.  DVT prophylaxis: Lovenox Code Status: Full Family Communication: Discussed with patient Disposition Plan:  Status is: Inpatient  Remains inpatient appropriate because:Inpatient level of care appropriate due to severity of illness   Dispo: The patient is from: Home              Anticipated d/c is to: Home              Anticipated d/c date is: 2 days              Patient currently is not medically stable to d/c.  Difficult to place patient No              Level of care: Med-Surg  Consultants:   Cardiology  Procedures:  Antimicrobials:   Data Reviewed: I have personally reviewed following labs and imaging studies  CBC: Recent Labs  Lab 12/12/20 2302 12/13/20 0532 12/16/20 0504  WBC 5.6 6.1 5.9  NEUTROABS 3.7  --   --   HGB 10.8* 10.7* 10.8*  HCT 32.6* 32.8* 33.0*  MCV 78.9* 79.4* 78.8*  PLT 291 301 294   Basic Metabolic Panel: Recent Labs  Lab 12/13/20 0013 12/13/20 0532 12/14/20 0704 12/15/20 0450 12/16/20 0504  NA 138 140 139 139 139  K 3.3* 3.5 3.6  3.8 4.2  CL 104 106 102 104 103  CO2 24 25 26 27 24   GLUCOSE 115* 142* 106* 112* 103*  BUN 23* 24* 27* 30* 32*  CREATININE 1.29* 1.42* 1.33* 1.44* 1.47*  CALCIUM 8.3* 8.5* 9.1 8.9 9.1  MG  --  2.0  --   --   --    GFR: Estimated Creatinine Clearance: 73.7 mL/min (A) (by C-G formula based on SCr of 1.47 mg/dL (H)). Liver Function Tests: Recent Labs  Lab 12/13/20 0013  AST 23  ALT 20  ALKPHOS 100  BILITOT 1.0  PROT 6.9  ALBUMIN 3.3*   No results for input(s): LIPASE, AMYLASE in the last 168 hours. No results for input(s): AMMONIA in the last 168 hours. Coagulation Profile: No results for input(s): INR, PROTIME in the last 168 hours. Cardiac Enzymes: No results for input(s): CKTOTAL, CKMB, CKMBINDEX, TROPONINI in the last 168 hours. BNP (last 3 results) No results for input(s): PROBNP in the last 8760 hours. HbA1C: No results for input(s): HGBA1C in the last 72 hours. CBG: No results for input(s): GLUCAP in the last 168 hours. Lipid Profile: No results for input(s): CHOL, HDL, LDLCALC, TRIG, CHOLHDL, LDLDIRECT in the last 72 hours. Thyroid Function Tests: No results for input(s): TSH, T4TOTAL, FREET4, T3FREE, THYROIDAB in the last 72 hours. Anemia Panel: No results for input(s): VITAMINB12, FOLATE, FERRITIN, TIBC, IRON, RETICCTPCT in the last 72 hours. Sepsis Labs: No results for input(s): PROCALCITON, LATICACIDVEN in the last 168 hours.  Recent Results (from the past 240 hour(s))  Resp Panel by RT-PCR (Flu A&B, Covid) Nasopharyngeal Swab     Status: None   Collection Time: 12/13/20  1:12 AM   Specimen: Nasopharyngeal Swab; Nasopharyngeal(NP) swabs in vial transport medium  Result Value Ref Range Status   SARS Coronavirus 2 by RT PCR NEGATIVE NEGATIVE Final    Comment: (NOTE) SARS-CoV-2 target nucleic acids are NOT DETECTED.  The SARS-CoV-2 RNA is generally detectable in upper respiratory specimens during the acute phase of infection. The lowest concentration of  SARS-CoV-2 viral copies this assay can detect is 138 copies/mL. A negative result does not preclude SARS-Cov-2 infection and should not be used as the sole basis for treatment or other patient management decisions. A negative result may occur with  improper specimen collection/handling, submission of specimen other than nasopharyngeal swab, presence of viral mutation(s) within the areas targeted by this assay, and inadequate number of viral copies(<138 copies/mL). A negative result must be combined with clinical observations, patient history, and epidemiological information. The expected result is Negative.  Fact Sheet for Patients:  02/10/21  Fact Sheet for Healthcare Providers:  BloggerCourse.com  This test is no t yet approved or cleared by the [place] FDA and  has been authorized for detection and/or diagnosis of  SARS-CoV-2 by FDA under an Emergency Use Authorization (EUA). This EUA will remain  in effect (meaning this test can be used) for the duration of the COVID-19 declaration under Section 564(b)(1) of the Act, 21 U.S.C.section 360bbb-3(b)(1), unless the authorization is terminated  or revoked sooner.       Influenza A by PCR NEGATIVE NEGATIVE Final   Influenza B by PCR NEGATIVE NEGATIVE Final    Comment: (NOTE) The Xpert Xpress SARS-CoV-2/FLU/RSV plus assay is intended as an aid in the diagnosis of influenza from Nasopharyngeal swab specimens and should not be used as a sole basis for treatment. Nasal washings and aspirates are unacceptable for Xpert Xpress SARS-CoV-2/FLU/RSV testing.  Fact Sheet for Patients: BloggerCourse.com  Fact Sheet for Healthcare Providers: SeriousBroker.it  This test is not yet approved or cleared by the [place] FDA and has been authorized for detection and/or diagnosis of SARS-CoV-2 by FDA under an Emergency Use  Authorization (EUA). This EUA will remain in effect (meaning this test can be used) for the duration of the COVID-19 declaration under Section 564(b)(1) of the Act, 21 U.S.C. section 360bbb-3(b)(1), unless the authorization is terminated or revoked.  Performed at [facility], [address] [place], [place] [address]      Radiology Studies: No results found.  Scheduled Meds: . aspirin EC  81 mg Oral Daily  . atorvastatin  40 mg Oral Daily  . carvedilol  6.25 mg Oral BID WC  . enoxaparin (LOVENOX) injection  0.5 mg/kg Subcutaneous Q24H  . furosemide  80 mg Intravenous Q12H  . gabapentin  300 mg Oral QHS  . isosorbide-hydrALAZINE  1 tablet Oral TID  . losartan  12.5 mg Oral Daily  . pantoprazole  40 mg Oral Daily  . potassium chloride SA  20 mEq Oral Daily  . sodium chloride flush  3 mL Intravenous Q12H   Continuous Infusions:   LOS: 3 days   Time spent: 25 minutes.  Arnetha Courser, MD [facility]  If 7PM-7AM, please contact night-coverage [email or web address]  12/16/2020, 1:43 PM   This record has been created using Conservation officer, historic buildings. Errors have been sought and corrected,but may not always be located. Such creation errors do not reflect on the standard of care.

## 2020-12-16 NOTE — Progress Notes (Signed)
Call from telemetry, patient had 5 beat run of VTach.  Patient was sitting up in chair, alert and oriented, no increased SOB, no chest pain, no dizziness/lightheadedness.  MD notified.

## 2020-12-16 NOTE — Progress Notes (Addendum)
Progress Note  Patient Name: Dale [name] Date of Encounter: 12/16/2020  Primary Cardiologist: Nahser  Subjective   Dyspnea, orthopnea, and edema persist, though are improving. No chest pain. Reports UOP dropped off last night.  Documented UOP 1.8 L for the past 24 hours with a net - 8.5 L for the admission. Weight 121.3-->122 kg over the past 24 hours. Renal function relatively stable.   Inpatient Medications    Scheduled Meds: . aspirin EC  81 mg Oral Daily  . atorvastatin  40 mg Oral Daily  . carvedilol  6.25 mg Oral BID WC  . enoxaparin (LOVENOX) injection  0.5 mg/kg Subcutaneous Q24H  . furosemide  80 mg Intravenous Daily  . gabapentin  300 mg Oral QHS  . isosorbide-hydrALAZINE  1 tablet Oral TID  . losartan  12.5 mg Oral Daily  . pantoprazole  40 mg Oral Daily  . potassium chloride SA  20 mEq Oral Daily  . sodium chloride flush  3 mL Intravenous Q12H   Continuous Infusions:  PRN Meds: acetaminophen **OR** acetaminophen, magnesium hydroxide, ondansetron **OR** ondansetron (ZOFRAN) IV, traZODone   Vital Signs    Vitals:   12/15/20 1953 12/15/20 2246 12/15/20 2346 12/16/20 0351  BP: 124/85 (!) 118/93 (!) 118/93 (!) 98/55  Pulse: 94 91 91 93  Resp: 17 19 19 17   Temp: 98.3 F (36.8 C) 98.4 F (36.9 C) 98.4 F (36.9 C) 98.6 F (37 C)  TempSrc:  Oral    SpO2: 92% 99% 99% 94%  Weight:    122 kg  Height:        Intake/Output Summary (Last 24 hours) at 12/16/2020 0726 Last data filed at 12/15/2020 2141 Gross per 24 hour  Intake 480 ml  Output 2725 ml  Net -2245 ml   Filed Weights   12/15/20 0416 12/15/20 0741 12/16/20 0351  Weight: 122.7 kg 121.3 kg 122 kg    Telemetry    SR - Personally Reviewed  ECG    No new tracing - Personally Reviewed  Physical Exam   GEN: No acute distress.   Neck: JVD elevated to the angle of the mandible. Cardiac: RRR, I/VI systolic murmur, no rubs, or gallops.  Respiratory: Diminished breath sounds bilaterally.  GI:  Soft, nontender, non-distended.   MS: 2-3+ bilateral lower extremity edema to the thighs along with sacral edema; No deformity.  Compression stockings noted. Neuro:  Alert and oriented x 3; Nonfocal.  Psych: Normal affect.  Labs    Chemistry Recent Labs  Lab 12/13/20 0013 12/13/20 0532 12/14/20 0704 12/15/20 0450 12/16/20 0504  NA 138   < > 139 139 139  K 3.3*   < > 3.6 3.8 4.2  CL 104   < > 102 104 103  CO2 24   < > 26 27 24   GLUCOSE 115*   < > 106* 112* 103*  BUN 23*   < > 27* 30* 32*  CREATININE 1.29*   < > 1.33* 1.44* 1.47*  CALCIUM 8.3*   < > 9.1 8.9 9.1  PROT 6.9  --   --   --   --   ALBUMIN 3.3*  --   --   --   --   AST 23  --   --   --   --   ALT 20  --   --   --   --   ALKPHOS 100  --   --   --   --   BILITOT 1.0  --   --   --   --  GFRNONAA >60   < > >60 57* 55*  ANIONGAP 10   < > 11 8 12    < > = values in this interval not displayed.     Hematology Recent Labs  Lab 12/12/20 2302 12/13/20 0532 12/16/20 0504  WBC 5.6 6.1 5.9  RBC 4.13* 4.13* 4.19*  HGB 10.8* 10.7* 10.8*  HCT 32.6* 32.8* 33.0*  MCV 78.9* 79.4* 78.8*  MCH 26.2 25.9* 25.8*  MCHC 33.1 32.6 32.7  RDW 17.1* 16.8* 16.9*  PLT 291 301 294    Cardiac EnzymesNo results for input(s): TROPONINI in the last 168 hours. No results for input(s): TROPIPOC in the last 168 hours.   BNP Recent Labs  Lab 12/12/20 2302  BNP 3,111.3*     DDimer No results for input(s): DDIMER in the last 168 hours.   Radiology    DG Chest 2 View  Result Date: 12/12/2020 IMPRESSION: 1. Stable enlarged cardiac silhouette.  No acute airspace disease. Electronically Signed   By: 02/09/2021 M.D.   On: 12/12/2020 23:20    Cardiac Studies   Limited 2D echo 12/14/2020: 1. Left ventricular ejection fraction, by estimation, is <20%. The left  ventricle has severely decreased function. The left ventricle demonstrates  global hypokinesis. The left ventricular internal cavity size was severely  dilated. Left  ventricular  diastolic parameters are indeterminate.  2. Right ventricular systolic function is moderately reduced. The right  ventricular size is mildly enlarged. There is normal pulmonary artery  systolic pressure.  3. The mitral valve is normal in structure. Mild mitral valve  regurgitation.  __________  2D echo 09/2020: 1. Left ventricular ejection fraction, by estimation, is approximately  20%. The left ventricle has severely decreased function. The left  ventricle demonstrates global hypokinesis with regional variation as  noted. The left ventricular internal cavity  size was moderately dilated. Left ventricular diastolic parameters are  consistent with Grade III diastolic dysfunction (restrictive). No obvious  mural thrombus noted with Definity contrast.  2. Right ventricular systolic function is moderately to severely reduced.  The right ventricular size is normal. There is moderately elevated  pulmonary artery systolic pressure. The estimated right ventricular  systolic pressure is 52.0 mmHg.  3. Left atrial size was mild to moderately dilated.  4. Right atrial size was mild to moderately dilated.  5. The mitral valve is grossly normal. Mild mitral valve regurgitation.  6. The aortic valve is tricuspid. Aortic valve regurgitation is trivial.  7. The inferior vena cava is dilated in size with <50% respiratory  variability, suggesting right atrial pressure of 15 mmHg. __________  [facility] 05/2019: Conclusions: 1. Severe single-vessel coronary artery disease with chronic total occlusion of the proximal RCA. 2. Mild to moderate, non-obstructive coronary artery disease involving the LAD and LCx. 3. Moderately elevated left heart filling pressures. 4. Severely elevated right heart filling pressures. 5. Mild pulmonary hypertension. 6. Normal Fick cardiac output/index.  Recommendations: 1. 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  Patient will be  transported back to [facility] after recovery from today's procedure. 2. Continue diuresis and optimization of evidence-based heart failure therapy.  I will restart furosemide 40 mg IV BID this afternoon. 3. Secondary prevention of coronary artery disease, including high-intensity statin therapy and avoidance of cocaine.   Patient Profile     60 y.o. 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  Assessment & Plan    1. Acute on chronic combined systolic and diastolic CHF with possible mixed ICM and NICM: -He remains significantly volume overloaded and reports a dry weight around 245 pounds with a current weight of 267 pounds -Continue IV Lasix 80 mg bid, consider addition of metolazone, will discuss with MD -He will need several more days of inpatient diuresis  -Limited echo this admission with an EF less than 20% largely consistent with prior study -Coreg, BiDil and IV Lasix -Not on MRA with underlying CKD, consider starting this prior to discharge, after he has been adequately diuresed, if renal function allows at that time -Look to add SGLT2i prior to discharge or in outpatient follow up -CHF education -Daily weights -Strict I/O  2. CAD involving the native coronary arteries with elevated troponin: -Minimally elevated and flat tredning HS-Tn with a peak of 31, now down trending and not consistent with ACS -No indication for heparin gtt -ASA, Coreg, Lipitor  -Await echo as above -Currently, no plans for inpatient ischemic evaluation   3. HTN: -Blood pressure has been soft at times and will need continued monitoring -Continue current medical therapy as above  4. HLD: -LDL 111 in 09/2020 with goal LDL < 70 -PTA Lipitor,  follow up as outpatient   5. Polysubstance use: -He denies EtOH use and reports he last used cocaine 2 weeks prior to his admission  -Drug screen negative this admission -Complete cessation is recommended   6. CKD stage II: -Stable -Monitor with diuresis   7. Anemia: -Possibly of chronic disease -Stable  8. PVCs/NSVT: -Coreg as above -Potassium improving -Magnesium at goal -Possibly in the context of his cardiomyopathy, though could also be contributing to his cardiomyopathy    For questions or updates, please contact [facility] Please consult [email or web address] for contact info under Cardiology/STEMI.    Signed, Eula Listen, PA-C [facility] Pager: [phone] 12/16/2020, 7:26 AM  Patient examined chart reviewed Patient has significant dyspnea with any activity Still with LE edema past knees  Would not add zaroxyln till output falls below 1 Liter/day K is supplemented and improved  Social issues significant ? Living in shelter in [place] and essentially hoemless  Charlton Haws MD [facility]

## 2020-12-17 DIAGNOSIS — I2510 Atherosclerotic heart disease of native coronary artery without angina pectoris: Secondary | ICD-10-CM | POA: Diagnosis not present

## 2020-12-17 DIAGNOSIS — I5021 Acute systolic (congestive) heart failure: Secondary | ICD-10-CM | POA: Diagnosis not present

## 2020-12-17 DIAGNOSIS — I5023 Acute on chronic systolic (congestive) heart failure: Secondary | ICD-10-CM | POA: Diagnosis not present

## 2020-12-17 LAB — BASIC METABOLIC PANEL
Anion gap: 11 (ref 5–15)
BUN: 36 mg/dL — ABNORMAL HIGH (ref 6–20)
CO2: 27 mmol/L (ref 22–32)
Calcium: 9.1 mg/dL (ref 8.9–10.3)
Chloride: 101 mmol/L (ref 98–111)
Creatinine, Ser: 1.49 mg/dL — ABNORMAL HIGH (ref 0.61–1.24)
GFR, Estimated: 54 mL/min — ABNORMAL LOW (ref >60)
Glucose, Bld: 107 mg/dL — ABNORMAL HIGH (ref 70–99)
Potassium: 4.2 mmol/L (ref 3.5–5.1)
Sodium: 139 mmol/L (ref 135–145)

## 2020-12-17 MED ORDER — LOSARTAN POTASSIUM 25 MG PO TABS: 12.5000 mg | ORAL_TABLET | Freq: Every day | ORAL | 0 refills | Status: DC

## 2020-12-17 MED ORDER — TORSEMIDE 40 MG PO TABS: 40.0000 mg | ORAL_TABLET | Freq: Two times a day (BID) | ORAL | 2 refills | Status: DC

## 2020-12-17 MED ORDER — ATORVASTATIN CALCIUM 40 MG PO TABS: 40.0000 mg | ORAL_TABLET | Freq: Every day | ORAL | 0 refills | Status: DC

## 2020-12-17 MED ORDER — CARVEDILOL 6.25 MG PO TABS: 6.2500 mg | ORAL_TABLET | Freq: Two times a day (BID) | ORAL | 0 refills | Status: DC

## 2020-12-17 MED ORDER — ASPIRIN 81 MG PO TBEC: 81.0000 mg | DELAYED_RELEASE_TABLET | Freq: Every day | ORAL | 11 refills | Status: DC

## 2020-12-17 MED ORDER — ISOSORB DINITRATE-HYDRALAZINE 20-37.5 MG PO TABS: 1.0000 | ORAL_TABLET | Freq: Three times a day (TID) | ORAL | 0 refills | Status: DC

## 2020-12-17 MED ORDER — TORSEMIDE 20 MG PO TABS
40.0000 mg | ORAL_TABLET | Freq: Two times a day (BID) | ORAL | Status: DC
  Administered 2020-12-17: 40 mg via ORAL
  Filled 2020-12-17 (×2): qty 2

## 2020-12-17 NOTE — Discharge Summary (Signed)
Physician Discharge Summary  Welford RocheRonald [name] ZOX:[id number] DOB: 1961-02-24 DOA: 12/12/2020  PCP: Vesta MixerNahser, Philip J, MD  Admit date: 12/12/2020 Discharge date: 12/17/2020  Admitted From: Home Disposition: Home  Recommendations for Outpatient Follow-up:  1. Follow up with PCP in 1-2 weeks 2. Follow-up with cardiology 3. Follow-up in heart failure clinic 4. Please obtain BMP/CBC in one week 5. Please follow up on the following pending results: None  Home Health: No Equipment/Devices: None Discharge Condition: Stable CODE STATUS: Full Diet recommendation: Heart Healthy /  Brief/Interim Summary: Dale JeffersonRonald [name] a 60 y.o.African-American malewith medical history significant forcombined systolic and diastolic CHF, cocaine abuse, hypertension and alcohol abuse, who presented to the emergency room with acute onset of worsening dyspnea with worsening lower extremity edema. The patient was given about 20 pounds over the last week. He admitted to orthopnea, paroxysmal nocturnal dyspneaas well as dry cough. He has been having significant dyspnea on exertion even walking around the house. Patient has an history of cocaine use, last use was a week ago. Found to have positive troponin and BNP of 3111.  COVID-19 negative. Admitted for acute on chronic heart failure, severely reduced a EF of less than 20%.  He diuresed well on IV Lasix with net negative of more than 12 L. He was discharged home on increased dose of torsemide at 40 mg twice daily. Renal functions remained stable with mild elevation of creatinine. Patient was discharged home when clinically appears euvolemic with trace lower extremity edema.  Lungs clear.  He needs to follow-up in advance heart [place] clinic [place].  He will continue with rest of his home meds and follow-up with cardiology and heart failure clinic for further recommendations.  Discharge Diagnoses:  Active Problems:   Acute systolic CHF (congestive heart  failure) [facility](HCC)  Discharge Instructions  Discharge Instructions    (HEART FAILURE PATIENTS) Call MD:  Anytime you have any of the following symptoms: 1) 3 pound weight gain in 24 hours or 5 pounds in 1 week 2) shortness of breath, with or without a dry hacking cough 3) swelling in the hands, feet or stomach 4) if you have to sleep on extra pillows at night in order to breathe.   Complete by: As directed    AMB referral to CHF clinic   Complete by: As directed    Avoid straining   Complete by: As directed    Diet - low sodium heart healthy   Complete by: As directed    Heart Failure patients record your daily weight using the same scale at the same time of day   Complete by: As directed    Increase activity slowly   Complete by: As directed    STOP any activity that causes chest pain, shortness of breath, dizziness, sweating, or exessive weakness   Complete by: As directed      Allergies as of 12/17/2020      Reactions   Hydrocodone Itching      Medication List    TAKE these medications   aspirin 81 MG EC tablet Take 1 tablet (81 mg total) by mouth daily. Swallow whole.   atorvastatin 40 MG tablet Commonly known as: LIPITOR Take 1 tablet (40 mg total) by mouth daily.   carvedilol 6.25 MG tablet Commonly known as: COREG Take 1 tablet (6.25 mg total) by mouth 2 (two) times daily with a meal.   diclofenac sodium 1 % Gel Commonly known as: VOLTAREN Apply 2 g topically 4 (four) times daily as needed (  pain).   gabapentin 300 MG capsule Commonly known as: NEURONTIN Take 1 capsule (300 mg total) by mouth at bedtime.   isosorbide-hydrALAZINE 20-37.5 MG tablet Commonly known as: BIDIL Take 1 tablet by mouth 3 (three) times daily.   losartan 25 MG tablet Commonly known as: COZAAR Take 0.5 tablets (12.5 mg total) by mouth daily.   pantoprazole 40 MG tablet Commonly known as: PROTONIX Take 1 tablet (40 mg total) by mouth daily.   potassium chloride SA 20 MEQ  tablet Commonly known as: KLOR-CON Take 1 tablet (20 mEq total) by mouth daily.   Torsemide 40 MG Tabs Take 40 mg by mouth 2 (two) times daily. What changed:   medication strength  how much to take       Follow-up Information    [facility] Follow up on 12/25/2020.   Specialty: Cardiology Why: at 1:30pm. Enter through the Medical Mall entrance Contact information: [address] [place] [address] [phone]       Nahser, Deloris Ping, MD. Schedule an appointment as soon as possible for a visit.   Specialty: Cardiology Contact information: [address] [place] [address] [phone]        Nahser, Deloris Ping, MD .   Specialty: Cardiology Contact information: [address] [place] [address] [phone]              Allergies  Allergen Reactions  . Hydrocodone Itching    Consultations:  Cardiology  Procedures/Studies: DG Chest 2 View  Result Date: 12/12/2020 CLINICAL DATA:  Short of breath, bilateral lower extremity edema EXAM: CHEST - 2 VIEW COMPARISON:  11/20/2020 FINDINGS: Frontal and lateral views of the chest demonstrate stable enlarged cardiac silhouette. No airspace disease, effusion, or pneumothorax. No acute bony abnormalities. IMPRESSION: 1. Stable enlarged cardiac silhouette.  No acute airspace disease. Electronically Signed   By: Sharlet Salina M.D.   On: 12/12/2020 23:20   DG Chest 2 View  Result Date: 11/20/2020 CLINICAL DATA:  Shortness of breath EXAM: CHEST - 2 VIEW COMPARISON:  October 29, 2020 FINDINGS: The heart size and mediastinal contours are mildly enlarged. There is prominence of the central pulmonary vasculature. No large airspace consolidation or pleural effusion. The visualized skeletal structures are unremarkable. IMPRESSION: Mild cardiomegaly and pulmonary vascular congestion. Electronically Signed   By: Jonna Clark M.D.    On: 11/20/2020 02:36   ECHOCARDIOGRAM LIMITED  Result Date: 12/14/2020    ECHOCARDIOGRAM LIMITED REPORT   Patient Name:   Dale [name] Date of Exam: 12/14/2020 Medical Rec #:  [id number]     Height:       71.0 in Accession #:    [id number]    Weight:       270.1 lb Date of Birth:  07-Dec-1960     BSA:          2.396 m Patient Age:    59 years      BP:           113/87 mmHg Patient Gender: M             HR:           78 bpm. Exam Location:  [facility] Procedure: Limited Echo, Cardiac Doppler and Color Doppler Indications:     CHF I50.9  History:         Patient has prior history of Echocardiogram examinations, most  recent 09/09/2020. Risk Factors:Hypertension.  Sonographer:     Cristela Blue RDCS (AE) Referring Phys:  [id number] Lennon Alstrom Diagnosing Phys: Julien Nordmann MD IMPRESSIONS  1. Left ventricular ejection fraction, by estimation, is <20%. The left ventricle has severely decreased function. The left ventricle demonstrates global hypokinesis. The left ventricular internal cavity size was severely dilated. Left ventricular diastolic parameters are indeterminate.  2. Right ventricular systolic function is moderately reduced. The right ventricular size is mildly enlarged. There is normal pulmonary artery systolic pressure.  3. The mitral valve is normal in structure. Mild mitral valve regurgitation. FINDINGS  Left Ventricle: Left ventricular ejection fraction, by estimation, is <20%. The left ventricle has severely decreased function. The left ventricle demonstrates global hypokinesis. The left ventricular internal cavity size was severely dilated. There is no left ventricular hypertrophy. Left ventricular diastolic parameters are indeterminate. Right Ventricle: The right ventricular size is mildly enlarged. No increase in right ventricular wall thickness. Right ventricular systolic function is moderately reduced. There is normal pulmonary artery systolic pressure. The tricuspid regurgitant  velocity is 2.35 m/s, and with an assumed right atrial pressure of 5 mmHg, the estimated right ventricular systolic pressure is 27.1 mmHg. Left Atrium: Left atrial size was normal in size. Right Atrium: Right atrial size was normal in size. Pericardium: There is no evidence of pericardial effusion. Mitral Valve: The mitral valve is normal in structure. Mild mitral valve regurgitation. No evidence of mitral valve stenosis. Tricuspid Valve: The tricuspid valve is normal in structure. Tricuspid valve regurgitation is mild . No evidence of tricuspid stenosis. Aortic Valve: The aortic valve is normal in structure. Aortic valve regurgitation is not visualized. No aortic stenosis is present. Pulmonic Valve: The pulmonic valve was normal in structure. Pulmonic valve regurgitation is not visualized. No evidence of pulmonic stenosis. Aorta: The aortic root is normal in size and structure. Venous: The inferior vena cava is normal in size with greater than 50% respiratory variability, suggesting right atrial pressure of 3 mmHg. IAS/Shunts: No atrial level shunt detected by color flow Doppler. LEFT VENTRICLE PLAX 2D LVIDd:         6.72 cm LVIDs:         6.60 cm LV PW:         1.83 cm LV IVS:        1.07 cm LVOT diam:     2.30 cm LVOT Area:     4.15 cm  LV Volumes (MOD) LV vol d, MOD A2C: 330.0 ml LV vol d, MOD A4C: 232.0 ml LV vol s, MOD A2C: 255.0 ml LV vol s, MOD A4C: 224.0 ml LV SV MOD A2C:     75.0 ml LV SV MOD A4C:     232.0 ml LV SV MOD BP:      36.8 ml LEFT ATRIUM         Index LA diam:    4.30 cm 1.79 cm/m                        PULMONIC VALVE AORTA                 PV Vmax:        0.75 m/s Ao Root diam: 4.00 cm PV Peak grad:   2.2 mmHg                       RVOT Peak grad: 3 mmHg  TRICUSPID VALVE TR Peak grad:   22.1  mmHg TR Vmax:        235.00 cm/s  SHUNTS Systemic Diam: 2.30 cm Julien Nordmann MD Electronically signed by Julien Nordmann MD Signature Date/Time: 12/14/2020/3:13:03 PM    Final      Subjective: Patient  was feeling better when seen today.  Lower extremity edema improved.  Denies any chest pain or shortness of breath.  Wants to go home.  Discharge Exam: Vitals:   12/17/20 0754 12/17/20 0800  BP: 102/79 102/79  Pulse: 89 89  Resp:  16  Temp: 97.6 F (36.4 C) 97.6 F (36.4 C)  SpO2: 100% 100%   Vitals:   12/17/20 0014 12/17/20 0530 12/17/20 0754 12/17/20 0800  BP: 101/75 (!) 138/125 102/79 102/79  Pulse: 90 89 89 89  Resp: 19 19  16   Temp: (!) 97.5 F (36.4 C) 97.9 F (36.6 C) 97.6 F (36.4 C) 97.6 F (36.4 C)  TempSrc: Oral  Oral Oral  SpO2: 99% 100% 100% 100%  Weight:      Height:        General: Pt is alert, awake, not in acute distress Cardiovascular: RRR, S1/S2 +, no rubs, no gallops Respiratory: CTA bilaterally, no wheezing, no rhonchi Abdominal: Soft, NT, ND, bowel sounds + Extremities: Trace LE edema, no cyanosis   The results of significant diagnostics from this hospitalization (including imaging, microbiology, ancillary and laboratory) are listed below for reference.    Microbiology: Recent Results (from the past 240 hour(s))  Resp Panel by RT-PCR (Flu A&B, Covid) Nasopharyngeal Swab     Status: None   Collection Time: 12/13/20  1:12 AM   Specimen: Nasopharyngeal Swab; Nasopharyngeal(NP) swabs in vial transport medium  Result Value Ref Range Status   SARS Coronavirus 2 by RT PCR NEGATIVE NEGATIVE Final    Comment: (NOTE) SARS-CoV-2 target nucleic acids are NOT DETECTED.  The SARS-CoV-2 RNA is generally detectable in upper respiratory specimens during the acute phase of infection. The lowest concentration of SARS-CoV-2 viral copies this assay can detect is 138 copies/mL. A negative result does not preclude SARS-Cov-2 infection and should not be used as the sole basis for treatment or other patient management decisions. A negative result may occur with  improper specimen collection/handling, submission of specimen other than nasopharyngeal swab, presence  of viral mutation(s) within the areas targeted by this assay, and inadequate number of viral copies(<138 copies/mL). A negative result must be combined with clinical observations, patient history, and epidemiological information. The expected result is Negative.  Fact Sheet for Patients:  02/10/21  Fact Sheet for Healthcare Providers:  BloggerCourse.com  This test is no t yet approved or cleared by the [place] FDA and  has been authorized for detection and/or diagnosis of SARS-CoV-2 by FDA under an Emergency Use Authorization (EUA). This EUA will remain  in effect (meaning this test can be used) for the duration of the COVID-19 declaration under Section 564(b)(1) of the Act, 21 U.S.C.section 360bbb-3(b)(1), unless the authorization is terminated  or revoked sooner.       Influenza A by PCR NEGATIVE NEGATIVE Final   Influenza B by PCR NEGATIVE NEGATIVE Final    Comment: (NOTE) The Xpert Xpress SARS-CoV-2/FLU/RSV plus assay is intended as an aid in the diagnosis of influenza from Nasopharyngeal swab specimens and should not be used as a sole basis for treatment. Nasal washings and aspirates are unacceptable for Xpert Xpress SARS-CoV-2/FLU/RSV testing.  Fact Sheet for Patients: [place]  Fact Sheet for Healthcare Providers: BloggerCourse.com  This test is not yet  approved or cleared by the [place] and has been authorized for detection and/or diagnosis of SARS-CoV-2 by FDA under an Emergency Use Authorization (EUA). This EUA will remain in effect (meaning this test can be used) for the duration of the COVID-19 declaration under Section 564(b)(1) of the Act, 21 U.S.C. section 360bbb-3(b)(1), unless the authorization is terminated or revoked.  Performed at [facility], [address] [place], [place] [address]      Labs: BNP (last 3  results) Recent Labs    10/29/20 2014 11/20/20 0227 12/12/20 2302  BNP 2,889.3* 2,321.1* 3,111.3*   Basic Metabolic Panel: Recent Labs  Lab 12/13/20 0532 12/14/20 0704 12/15/20 0450 12/16/20 0504 12/17/20 0444  NA 140 139 139 139 139  K 3.5 3.6 3.8 4.2 4.2  CL 106 102 104 103 101  CO2 25 26 27 24 27   GLUCOSE 142* 106* 112* 103* 107*  BUN 24* 27* 30* 32* 36*  CREATININE 1.42* 1.33* 1.44* 1.47* 1.49*  CALCIUM 8.5* 9.1 8.9 9.1 9.1  MG 2.0  --   --   --   --    Liver Function Tests: Recent Labs  Lab 12/13/20 0013  AST 23  ALT 20  ALKPHOS 100  BILITOT 1.0  PROT 6.9  ALBUMIN 3.3*   No results for input(s): LIPASE, AMYLASE in the last 168 hours. No results for input(s): AMMONIA in the last 168 hours. CBC: Recent Labs  Lab 12/12/20 2302 12/13/20 0532 12/16/20 0504  WBC 5.6 6.1 5.9  NEUTROABS 3.7  --   --   HGB 10.8* 10.7* 10.8*  HCT 32.6* 32.8* 33.0*  MCV 78.9* 79.4* 78.8*  PLT 291 301 294   Cardiac Enzymes: No results for input(s): CKTOTAL, CKMB, CKMBINDEX, TROPONINI in the last 168 hours. BNP: Invalid input(s): POCBNP CBG: No results for input(s): GLUCAP in the last 168 hours. D-Dimer No results for input(s): DDIMER in the last 72 hours. Hgb A1c No results for input(s): HGBA1C in the last 72 hours. Lipid Profile No results for input(s): CHOL, HDL, LDLCALC, TRIG, CHOLHDL, LDLDIRECT in the last 72 hours. Thyroid function studies No results for input(s): TSH, T4TOTAL, T3FREE, THYROIDAB in the last 72 hours.  Invalid input(s): FREET3 Anemia work up No results for input(s): VITAMINB12, FOLATE, FERRITIN, TIBC, IRON, RETICCTPCT in the last 72 hours. Urinalysis    Component Value Date/Time   COLORURINE COLORLESS (A) 02/28/2019 1309   APPEARANCEUR CLEAR 02/28/2019 1309   LABSPEC 1.004 (L) 02/28/2019 1309   PHURINE 7.0 02/28/2019 1309   GLUCOSEU NEGATIVE 02/28/2019 1309   HGBUR NEGATIVE 02/28/2019 1309   BILIRUBINUR NEGATIVE 02/28/2019 1309   KETONESUR  NEGATIVE 02/28/2019 1309   PROTEINUR NEGATIVE 02/28/2019 1309   UROBILINOGEN 0.2 03/11/2016 0934   NITRITE NEGATIVE 02/28/2019 1309   LEUKOCYTESUR NEGATIVE 02/28/2019 1309   Sepsis Labs Invalid input(s): PROCALCITONIN,  WBC,  LACTICIDVEN Microbiology Recent Results (from the past 240 hour(s))  Resp Panel by RT-PCR (Flu A&B, Covid) Nasopharyngeal Swab     Status: None   Collection Time: 12/13/20  1:12 AM   Specimen: Nasopharyngeal Swab; Nasopharyngeal(NP) swabs in vial transport medium  Result Value Ref Range Status   SARS Coronavirus 2 by RT PCR NEGATIVE NEGATIVE Final    Comment: (NOTE) SARS-CoV-2 target nucleic acids are NOT DETECTED.  The SARS-CoV-2 RNA is generally detectable in upper respiratory specimens during the acute phase of infection. The lowest concentration of SARS-CoV-2 viral copies this assay can detect is 138 copies/mL. A negative result does not  preclude SARS-Cov-2 infection and should not be used as the sole basis for treatment or other patient management decisions. A negative result may occur with  improper specimen collection/handling, submission of specimen other than nasopharyngeal swab, presence of viral mutation(s) within the areas targeted by this assay, and inadequate number of viral copies(<138 copies/mL). A negative result must be combined with clinical observations, patient history, and epidemiological information. The expected result is Negative.  Fact Sheet for Patients:  BloggerCourse.com  Fact Sheet for Healthcare Providers:  SeriousBroker.it  This test is no t yet approved or cleared by the [place] FDA and  has been authorized for detection and/or diagnosis of SARS-CoV-2 by FDA under an Emergency Use Authorization (EUA). This EUA will remain  in effect (meaning this test can be used) for the duration of the COVID-19 declaration under Section 564(b)(1) of the Act, 21 U.S.C.section  360bbb-3(b)(1), unless the authorization is terminated  or revoked sooner.       Influenza A by PCR NEGATIVE NEGATIVE Final   Influenza B by PCR NEGATIVE NEGATIVE Final    Comment: (NOTE) The Xpert Xpress SARS-CoV-2/FLU/RSV plus assay is intended as an aid in the diagnosis of influenza from Nasopharyngeal swab specimens and should not be used as a sole basis for treatment. Nasal washings and aspirates are unacceptable for Xpert Xpress SARS-CoV-2/FLU/RSV testing.  Fact Sheet for Patients: BloggerCourse.com  Fact Sheet for Healthcare Providers: SeriousBroker.it  This test is not yet approved or cleared by the [place] FDA and has been authorized for detection and/or diagnosis of SARS-CoV-2 by FDA under an Emergency Use Authorization (EUA). This EUA will remain in effect (meaning this test can be used) for the duration of the COVID-19 declaration under Section 564(b)(1) of the Act, 21 U.S.C. section 360bbb-3(b)(1), unless the authorization is terminated or revoked.  Performed at [facility], [address] [place], [place] [address]     Time coordinating discharge: Over 30 minutes  SIGNED:  Arnetha Courser, MD  [facility] 12/17/2020, 10:28 AM  If 7PM-7AM, please contact night-coverage [email or web address]  This record has been created using Conservation officer, historic buildings. Errors have been sought and corrected,but may not always be located. Such creation errors do not reflect on the standard of care.

## 2020-12-17 NOTE — Discharge Instructions (Signed)
It was pleasure taking care of you. Your cardiologist increase the dose of torsemide to 40 mg twice daily, you can take 1 earlier in the morning and second around 2 PM so it will not interfere with your sleep. Please follow-up with cardiology and in advance heart failure clinic for further recommendations.

## 2020-12-17 NOTE — Progress Notes (Addendum)
Progress Note  Patient Name: Dale [name] Date of Encounter: 12/17/2020  Primary Cardiologist: Nahser  Subjective   Dyspnea and orthopnea resolved. Lower extremity swelling improving. Wants to be discharged today. No chest pain.  Documented UOP 3.4 L for the past 24 hours with a net - 12. L for the admission. No weight this morning. Renal function starting to trend upwards. Up sitting in recliner with compression stockings in place.   Inpatient Medications    Scheduled Meds:  aspirin EC  81 mg Oral Daily   atorvastatin  40 mg Oral Daily   carvedilol  6.25 mg Oral BID WC   enoxaparin (LOVENOX) injection  0.5 mg/kg Subcutaneous Q24H   furosemide  80 mg Intravenous Q12H   gabapentin  300 mg Oral QHS   isosorbide-hydrALAZINE  1 tablet Oral TID   losartan  12.5 mg Oral Daily   pantoprazole  40 mg Oral Daily   potassium chloride SA  20 mEq Oral Daily   sodium chloride flush  3 mL Intravenous Q12H   Continuous Infusions:   PRN Meds: acetaminophen **OR** acetaminophen, magnesium hydroxide, ondansetron **OR** ondansetron (ZOFRAN) IV, traZODone   Vital Signs    Vitals:   12/17/20 0014 12/17/20 0530 12/17/20 0754 12/17/20 0800  BP: 101/75 (!) 138/125 102/79 102/79  Pulse: 90 89 89 89  Resp: 19 19  16   Temp: (!) 97.5 F (36.4 C) 97.9 F (36.6 C) 97.6 F (36.4 C) 97.6 F (36.4 C)  TempSrc: Oral  Oral Oral  SpO2: 99% 100% 100% 100%  Weight:      Height:        Intake/Output Summary (Last 24 hours) at 12/17/2020 0935 Last data filed at 12/17/2020 0500 Gross per 24 hour  Intake 720 ml  Output 4300 ml  Net -3580 ml   Filed Weights   12/15/20 0416 12/15/20 0741 12/16/20 0351  Weight: 122.7 kg 121.3 kg 122 kg    Telemetry    SR with rare PVCs and 8 beat run of NSVT - Personally Reviewed  ECG    No new tracing - Personally Reviewed  Physical Exam   GEN: No acute distress.   Neck: JVD mildly elevated. Cardiac: RRR, I/VI systolic murmur, no rubs, or gallops.   Respiratory:CTAB.  GI: Soft, nontender, non-distended.   MS: Improved 1+ bilateral lower extremity edema; No deformity.  Compression stockings noted. Neuro:  Alert and oriented x 3; Nonfocal.  Psych: Normal affect.  Labs    Chemistry Recent Labs  Lab 12/13/20 0013 12/13/20 0532 12/15/20 0450 12/16/20 0504 12/17/20 0444  NA 138   < > 139 139 139  K 3.3*   < > 3.8 4.2 4.2  CL 104   < > 104 103 101  CO2 24   < > 27 24 27   GLUCOSE 115*   < > 112* 103* 107*  BUN 23*   < > 30* 32* 36*  CREATININE 1.29*   < > 1.44* 1.47* 1.49*  CALCIUM 8.3*   < > 8.9 9.1 9.1  PROT 6.9  --   --   --   --   ALBUMIN 3.3*  --   --   --   --   AST 23  --   --   --   --   ALT 20  --   --   --   --   ALKPHOS 100  --   --   --   --   BILITOT 1.0  --   --   --   --  GFRNONAA >60   < > 57* 55* 54*  ANIONGAP 10   < > 8 12 11    < > = values in this interval not displayed.     Hematology Recent Labs  Lab 12/12/20 2302 12/13/20 0532 12/16/20 0504  WBC 5.6 6.1 5.9  RBC 4.13* 4.13* 4.19*  HGB 10.8* 10.7* 10.8*  HCT 32.6* 32.8* 33.0*  MCV 78.9* 79.4* 78.8*  MCH 26.2 25.9* 25.8*  MCHC 33.1 32.6 32.7  RDW 17.1* 16.8* 16.9*  PLT 291 301 294    Cardiac EnzymesNo results for input(s): TROPONINI in the last 168 hours. No results for input(s): TROPIPOC in the last 168 hours.   BNP Recent Labs  Lab 12/12/20 2302  BNP 3,111.3*     DDimer No results for input(s): DDIMER in the last 168 hours.   Radiology    DG Chest 2 View  Result Date: 12/12/2020 IMPRESSION: 1. Stable enlarged cardiac silhouette.  No acute airspace disease. Electronically Signed   By: 02/09/2021 M.D.   On: 12/12/2020 23:20    Cardiac Studies   Limited 2D echo 12/14/2020: 1. Left ventricular ejection fraction, by estimation, is <20%. The left  ventricle has severely decreased function. The left ventricle demonstrates  global hypokinesis. The left ventricular internal cavity size was severely  dilated. Left ventricular   diastolic parameters are indeterminate.   2. Right ventricular systolic function is moderately reduced. The right  ventricular size is mildly enlarged. There is normal pulmonary artery  systolic pressure.   3. The mitral valve is normal in structure. Mild mitral valve  regurgitation.  __________  2D echo 09/2020: 1. Left ventricular ejection fraction, by estimation, is approximately  20%. The left ventricle has severely decreased function. The left  ventricle demonstrates global hypokinesis with regional variation as  noted. The left ventricular internal cavity  size was moderately dilated. Left ventricular diastolic parameters are  consistent with Grade III diastolic dysfunction (restrictive). No obvious  mural thrombus noted with Definity contrast.   2. Right ventricular systolic function is moderately to severely reduced.  The right ventricular size is normal. There is moderately elevated  pulmonary artery systolic pressure. The estimated right ventricular  systolic pressure is 52.0 mmHg.   3. Left atrial size was mild to moderately dilated.   4. Right atrial size was mild to moderately dilated.   5. The mitral valve is grossly normal. Mild mitral valve regurgitation.   6. The aortic valve is tricuspid. Aortic valve regurgitation is trivial.   7. The inferior vena cava is dilated in size with <50% respiratory  variability, suggesting right atrial pressure of 15 mmHg. __________  [facility] 05/2019: Conclusions: Severe single-vessel coronary artery disease with chronic total occlusion of the proximal RCA. Mild to moderate, non-obstructive coronary artery disease involving the LAD and LCx. Moderately elevated left heart filling pressures. Severely elevated right heart filling pressures. Mild pulmonary hypertension. Normal Fick cardiac output/index.   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  Patient will be transported back to [facility] after recovery from today's procedure. Continue diuresis and optimization of evidence-based heart failure therapy.  I will restart furosemide 40 mg IV BID this afternoon. Secondary prevention of coronary artery disease, including high-intensity statin therapy and avoidance of cocaine.   Patient Profile     60 y.o. 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  Assessment & Plan    1. Acute on chronic combined systolic and diastolic CHF with possible mixed ICM and NICM: -He remains significantly volume overloaded and reports a dry weight around 245 pounds with a last documented weight of 267 pounds on 2/12 -Renal function starting to increase -He feels great and reports he is back to his baseline -Wants to go home, notified him this is a decision to discuss with the hospitalist  -Stop IV Lasix with up trending renal function -Start torsemide 40 mg bid (was on 20 mg bid PTA) -Limited echo this admission with an EF less than 20% largely consistent with prior study -Coreg, BiDil, losartan as BP allow -Not on MRA with underlying CKD, consider starting this prior to discharge, after he has been adequately diuresed, if renal function allows at that time -Look to add SGLT2i and transition to [facility] in outpatient follow up as able -Would benefit from establishing with the advanced heart failure team in [place] as an outpatient  -CHF education -Daily weights -Strict I/O  2. CAD involving the native coronary arteries with elevated troponin: -Minimally elevated and flat tredning HS-Tn with a peak of 31, now down trending and not consistent with ACS -No indication for heparin gtt -ASA, Coreg, Lipitor  -Echo as above -Plan for outpatient  ischemic testing   3. HTN: -Blood pressure has been soft at times, currently stable -Continue current medical therapy as above  4. HLD: -LDL 111 in 09/2020 with goal LDL < 70 -PTA Lipitor, follow up as outpatient   5. Polysubstance use: -He denies EtOH use and reports he last used cocaine 2 weeks prior to his admission  -Drug screen negative this admission -Complete cessation is recommended   6. CKD stage II: -Renal function starting to trend upwards -Monitor with diuresis   7. Anemia: -Possibly of chronic disease -Stable  8. PVCs/NSVT: -Coreg as above -Potassium repleted -Magnesium at goal -Possibly in the context of his cardiomyopathy, though could also be contributing to his cardiomyopathy    For questions or updates, please contact [facility] Please consult [email or web address] for contact info under Cardiology/STEMI.    Signed, Eula Listen, PA-C [facility] Pager: [phone] 12/17/2020, 9:35 AM  Patient examined chart reviewed He again is belligerent and insists he's going home today despite plus 2-3 brawny edema and moderate volume overload still D/c with demedex 40 mg bid in addition to meds above will arrange outpatient f/u in clinic next week  Charlton Haws MD [facility]

## 2020-12-17 NOTE — Progress Notes (Signed)
MD ordered patient to be discharged home.  Discharge instructions were reviewed with the patient and he voiced understanding.  Follow-up appointment was made.  Prescriptions sent to the patients pharmacy.  IV was removed with catheter intact.  Patients medications returned to him.  All patients questions were answered.  Patient left via wheelchair escorted by auxillary.

## 2020-12-18 ENCOUNTER — Telehealth: Payer: Self-pay

## 2020-12-18 ENCOUNTER — Telehealth: Payer: Self-pay | Admitting: Licensed Clinical Social Worker

## 2020-12-18 DIAGNOSIS — I5021 Acute systolic (congestive) heart failure: Secondary | ICD-10-CM

## 2020-12-18 NOTE — Telephone Encounter (Signed)
Transition Care Management Follow-up Telephone Call  Date of discharge and from where: 12/17/2020 from [facility]  How have you been since you were released from the hospital? Pt states that he is doing well, but is having some trouble with ADL's due to sob. Patient states that his health is declining and he is concerned about going to back to house he was staying since he is struggling with addiction. He currently does not have a place to live. Referral placed.   Any questions or concerns? No  Items Reviewed:  Did the pt receive and understand the discharge instructions provided? Yes   Medications obtained and verified? Yes   Other? No   Any new allergies since your discharge? No   Dietary orders reviewed? Heart Healthy Diet  Do you have support at home? Yes   Functional Questionnaire: (I = Independent and D = Dependent) ADLs: D  Bathing/Dressing- D  Meal Prep- D  Eating- I  Maintaining continence- I  Transferring/Ambulation- I but it is difficult.   Managing Meds- I  Follow up appointments reviewed:   PCP Hospital f/u appt confirmed? No  Pt will reach out to PCP for an appointment.  Specialist Hospital f/u appt confirmed? Yes  Scheduled to see Clarisa Kindred, FNP on 12/25/20 @ 1:30pm.  Are transportation arrangements needed? Yes   If their condition worsens, is the pt aware to call PCP or go to the Emergency Dept.? Yes  Was the patient provided with contact information for the PCP's office or ED? Yes  Was to pt encouraged to call back with questions or concerns? Yes

## 2020-12-18 NOTE — Telephone Encounter (Signed)
CSW contacted patient who states he was discharged from the hospital yesterday. Patient reports he is staying with his daughter at the moment and continues to explore options for substance treatment programs. CSW discussed again that he has been approved for admission at [facility] on [address] and waiting on a bed availability. Patient acknowledges that he called there this morning and spoke with June in admissions. CSW available as needed and patient instructed to continue to call [facility] for bed availability. Patient verbalizes understanding. [name], LCSW, CCSW-MCS [phone]

## 2020-12-19 ENCOUNTER — Telehealth: Payer: Self-pay

## 2020-12-19 NOTE — Telephone Encounter (Addendum)
Post Hospital 48-hour phone call.   Called and spoke with patient he states that he had he did not receive a scale at discharge and has not been weighing himself daily but that he has been sticking with his fluid restriction.  Went over his discharge medication list he is taking all his medications as per the discharge instructions except for the torsemide supposed to be 40 mg, 2 times a day.  He states that he is going to the pharmacy to pick it up later this afternoon.  Also he states he is not taking a half tablet of the cozaar but a whole tablet.  He states that he still has some edema but is not as much when he was discharged from the hospital.  He denied any increasing shortness of breath PND or orthopnea.  He has an appointment at 1:30 on February 24 at the outpatient heart failure clinic   Tresa Endo RN, [facility]

## 2020-12-21 ENCOUNTER — Telehealth (HOSPITAL_COMMUNITY): Payer: Self-pay | Admitting: Licensed Clinical Social Worker

## 2020-12-21 NOTE — Telephone Encounter (Signed)
CSW received call from patient asking for contact information for June at [facility]. Patient will call to inquire about bed availability for treatment program. CSW asked patient to return call to CSW to update on progress. [name], LCSW, CCSW-MCS [phone]

## 2020-12-23 NOTE — Progress Notes (Deleted)
   Patient ID: Dale [name], male    DOB: 1961-07-21, 60 y.o.   MRN: [id number]  HPI  Dale [name] is a 60 y/o male with a history of  Echo report from 12/14/20 reviewed and showed an EF of <20% along with mild Dale.   LHC/ RHC done 05/10/2019 showed: 1. Severe single-vessel coronary artery disease with chronic total occlusion of the proximal RCA. 2. Mild to moderate, non-obstructive coronary artery disease involving the LAD and LCx. 3. Moderately elevated left heart filling pressures. 4. Severely elevated right heart filling pressures. 5. Mild pulmonary hypertension. 6. Normal Fick cardiac output/index.  Admitted 12/12/20 due to shortness of breath and elevated troponin. Cardiology consult obtained. Covid negative. Initially given IV lasix with transition to oral diuretics. Discharged after 5 days.   He presents today for an initial visit with a chief complaint of  Review of Systems    Physical Exam  Assessment & Plan:  1: Chronic heart failure with reduced ejection fraction- - NYHA class - saw cardiology Shari Prows) 11/16/20 - BNP 12/12/20 was 3111.3  2: HTN- - BP  - BMP 12/17/20 reviewed and showed sodium 139, potassium 4.2, creatinine 1.49 and GFR 54  3: Substance abuse- - last used cocaine

## 2020-12-25 ENCOUNTER — Ambulatory Visit: Payer: Medicaid Other | Admitting: Family

## 2020-12-25 ENCOUNTER — Inpatient Hospital Stay
Admission: EM | Admit: 2020-12-25 | Discharge: 2021-01-01 | DRG: 291 | Disposition: A | Discharge status: Home / Self Care | Payer: Medicaid Other | Attending: Internal Medicine | Admitting: Internal Medicine

## 2020-12-25 ENCOUNTER — Other Ambulatory Visit: Payer: Self-pay

## 2020-12-25 ENCOUNTER — Encounter: Payer: Self-pay | Admitting: Emergency Medicine

## 2020-12-25 ENCOUNTER — Emergency Department: Payer: Medicaid Other

## 2020-12-25 DIAGNOSIS — I272 Pulmonary hypertension, unspecified: Secondary | ICD-10-CM | POA: Diagnosis present

## 2020-12-25 DIAGNOSIS — F1410 Cocaine abuse, uncomplicated: Secondary | ICD-10-CM | POA: Diagnosis present

## 2020-12-25 DIAGNOSIS — I5043 Acute on chronic combined systolic (congestive) and diastolic (congestive) heart failure: Secondary | ICD-10-CM | POA: Diagnosis present

## 2020-12-25 DIAGNOSIS — I130 Hypertensive heart and chronic kidney disease with heart failure and stage 1 through stage 4 chronic kidney disease, or unspecified chronic kidney disease: Principal | ICD-10-CM | POA: Diagnosis present

## 2020-12-25 DIAGNOSIS — I509 Heart failure, unspecified: Secondary | ICD-10-CM

## 2020-12-25 DIAGNOSIS — E876 Hypokalemia: Secondary | ICD-10-CM

## 2020-12-25 DIAGNOSIS — Z6836 Body mass index (BMI) 36.0-36.9, adult: Secondary | ICD-10-CM

## 2020-12-25 DIAGNOSIS — F1910 Other psychoactive substance abuse, uncomplicated: Secondary | ICD-10-CM | POA: Diagnosis not present

## 2020-12-25 DIAGNOSIS — N1831 Chronic kidney disease, stage 3a: Secondary | ICD-10-CM | POA: Diagnosis present

## 2020-12-25 DIAGNOSIS — E785 Hyperlipidemia, unspecified: Secondary | ICD-10-CM

## 2020-12-25 DIAGNOSIS — F1210 Cannabis abuse, uncomplicated: Secondary | ICD-10-CM | POA: Diagnosis present

## 2020-12-25 DIAGNOSIS — Z79899 Other long term (current) drug therapy: Secondary | ICD-10-CM

## 2020-12-25 DIAGNOSIS — I10 Essential (primary) hypertension: Secondary | ICD-10-CM | POA: Diagnosis present

## 2020-12-25 DIAGNOSIS — D631 Anemia in chronic kidney disease: Secondary | ICD-10-CM | POA: Diagnosis present

## 2020-12-25 DIAGNOSIS — N189 Chronic kidney disease, unspecified: Secondary | ICD-10-CM

## 2020-12-25 DIAGNOSIS — I472 Ventricular tachycardia: Secondary | ICD-10-CM | POA: Diagnosis not present

## 2020-12-25 DIAGNOSIS — N179 Acute kidney failure, unspecified: Secondary | ICD-10-CM | POA: Diagnosis present

## 2020-12-25 DIAGNOSIS — I5023 Acute on chronic systolic (congestive) heart failure: Secondary | ICD-10-CM | POA: Diagnosis present

## 2020-12-25 DIAGNOSIS — Z9119 Patient's noncompliance with other medical treatment and regimen: Secondary | ICD-10-CM

## 2020-12-25 DIAGNOSIS — I2510 Atherosclerotic heart disease of native coronary artery without angina pectoris: Secondary | ICD-10-CM

## 2020-12-25 DIAGNOSIS — Z7982 Long term (current) use of aspirin: Secondary | ICD-10-CM | POA: Diagnosis not present

## 2020-12-25 DIAGNOSIS — Z6834 Body mass index (BMI) 34.0-34.9, adult: Secondary | ICD-10-CM

## 2020-12-25 DIAGNOSIS — Z9114 Patient's other noncompliance with medication regimen: Secondary | ICD-10-CM

## 2020-12-25 DIAGNOSIS — E669 Obesity, unspecified: Secondary | ICD-10-CM | POA: Diagnosis not present

## 2020-12-25 DIAGNOSIS — I1310 Hypertensive heart and chronic kidney disease without heart failure, with stage 1 through stage 4 chronic kidney disease, or unspecified chronic kidney disease: Secondary | ICD-10-CM | POA: Diagnosis not present

## 2020-12-25 DIAGNOSIS — Z20822 Contact with and (suspected) exposure to covid-19: Secondary | ICD-10-CM | POA: Diagnosis present

## 2020-12-25 DIAGNOSIS — F1490 Cocaine use, unspecified, uncomplicated: Secondary | ICD-10-CM

## 2020-12-25 DIAGNOSIS — I441 Atrioventricular block, second degree: Secondary | ICD-10-CM | POA: Diagnosis present

## 2020-12-25 DIAGNOSIS — I420 Dilated cardiomyopathy: Secondary | ICD-10-CM | POA: Diagnosis present

## 2020-12-25 DIAGNOSIS — E6609 Other obesity due to excess calories: Secondary | ICD-10-CM

## 2020-12-25 LAB — URINALYSIS, COMPLETE (UACMP) WITH MICROSCOPIC
Bacteria, UA: NONE SEEN
Bilirubin Urine: NEGATIVE
Glucose, UA: NEGATIVE mg/dL
Hgb urine dipstick: NEGATIVE
Ketones, ur: NEGATIVE mg/dL
Leukocytes,Ua: NEGATIVE
Nitrite: NEGATIVE
Protein, ur: NEGATIVE mg/dL
Specific Gravity, Urine: 1.006 (ref 1.005–1.030)
Squamous Epithelial / HPF: NONE SEEN (ref 0–5)
WBC, UA: NONE SEEN WBC/hpf (ref 0–5)
pH: 7 (ref 5.0–8.0)

## 2020-12-25 LAB — CBC WITH DIFFERENTIAL/PLATELET
Abs Immature Granulocytes: 0.04 10*3/uL (ref 0.00–0.07)
Basophils Absolute: 0.1 10*3/uL (ref 0.0–0.1)
Basophils Relative: 1 %
Eosinophils Absolute: 0.1 10*3/uL (ref 0.0–0.5)
Eosinophils Relative: 1 %
HCT: 32 % — ABNORMAL LOW (ref 39.0–52.0)
Hemoglobin: 10.4 g/dL — ABNORMAL LOW (ref 13.0–17.0)
Immature Granulocytes: 1 %
Lymphocytes Relative: 14 %
Lymphs Abs: 0.9 10*3/uL (ref 0.7–4.0)
MCH: 25.4 pg — ABNORMAL LOW (ref 26.0–34.0)
MCHC: 32.5 g/dL (ref 30.0–36.0)
MCV: 78 fL — ABNORMAL LOW (ref 80.0–100.0)
Monocytes Absolute: 0.7 10*3/uL (ref 0.1–1.0)
Monocytes Relative: 11 %
Neutro Abs: 4.8 10*3/uL (ref 1.7–7.7)
Neutrophils Relative %: 72 %
Platelets: 271 10*3/uL (ref 150–400)
RBC: 4.1 MIL/uL — ABNORMAL LOW (ref 4.22–5.81)
RDW: 17.1 % — ABNORMAL HIGH (ref 11.5–15.5)
WBC: 6.6 10*3/uL (ref 4.0–10.5)
nRBC: 0 % (ref 0.0–0.2)

## 2020-12-25 LAB — URINE DRUG SCREEN, QUALITATIVE (ARMC ONLY)
Amphetamines, Ur Screen: NOT DETECTED
Barbiturates, Ur Screen: NOT DETECTED
Benzodiazepine, Ur Scrn: NOT DETECTED
Cannabinoid 50 Ng, Ur ~~LOC~~: POSITIVE — AB
Cocaine Metabolite,Ur ~~LOC~~: POSITIVE — AB
MDMA (Ecstasy)Ur Screen: NOT DETECTED
Methadone Scn, Ur: NOT DETECTED
Opiate, Ur Screen: NOT DETECTED
Phencyclidine (PCP) Ur S: NOT DETECTED
Tricyclic, Ur Screen: NOT DETECTED

## 2020-12-25 LAB — MAGNESIUM: Magnesium: 2.1 mg/dL (ref 1.7–2.4)

## 2020-12-25 LAB — COMPREHENSIVE METABOLIC PANEL
ALT: 22 U/L (ref 0–44)
AST: 20 U/L (ref 15–41)
Albumin: 3.4 g/dL — ABNORMAL LOW (ref 3.5–5.0)
Alkaline Phosphatase: 102 U/L (ref 38–126)
Anion gap: 9 (ref 5–15)
BUN: 39 mg/dL — ABNORMAL HIGH (ref 6–20)
CO2: 27 mmol/L (ref 22–32)
Calcium: 8.6 mg/dL — ABNORMAL LOW (ref 8.9–10.3)
Chloride: 98 mmol/L (ref 98–111)
Creatinine, Ser: 2.21 mg/dL — ABNORMAL HIGH (ref 0.61–1.24)
GFR, Estimated: 34 mL/min — ABNORMAL LOW (ref >60)
Glucose, Bld: 150 mg/dL — ABNORMAL HIGH (ref 70–99)
Potassium: 3.3 mmol/L — ABNORMAL LOW (ref 3.5–5.1)
Sodium: 134 mmol/L — ABNORMAL LOW (ref 135–145)
Total Bilirubin: 1.1 mg/dL (ref 0.3–1.2)
Total Protein: 7.2 g/dL (ref 6.5–8.1)

## 2020-12-25 LAB — TROPONIN I (HIGH SENSITIVITY)
Troponin I (High Sensitivity): 30 ng/L — ABNORMAL HIGH (ref <18)
Troponin I (High Sensitivity): 29 ng/L — ABNORMAL HIGH

## 2020-12-25 LAB — RESP PANEL BY RT-PCR (FLU A&B, COVID) ARPGX2
Influenza A by PCR: NEGATIVE
Influenza B by PCR: NEGATIVE
SARS Coronavirus 2 by RT PCR: NEGATIVE

## 2020-12-25 LAB — BRAIN NATRIURETIC PEPTIDE: B Natriuretic Peptide: 2743.6 pg/mL — ABNORMAL HIGH (ref 0.0–100.0)

## 2020-12-25 MED ORDER — ATORVASTATIN CALCIUM 20 MG PO TABS
40.0000 mg | ORAL_TABLET | Freq: Every day | ORAL | Status: DC
  Administered 2020-12-25 – 2021-01-01 (×8): 40 mg via ORAL
  Filled 2020-12-25 (×8): qty 2

## 2020-12-25 MED ORDER — SODIUM CHLORIDE 0.9 % IV SOLN: 250.0000 mL | INTRAVENOUS | Status: DC | PRN

## 2020-12-25 MED ORDER — ACETAMINOPHEN 325 MG PO TABS: 650.0000 mg | ORAL_TABLET | ORAL | Status: DC | PRN

## 2020-12-25 MED ORDER — SODIUM CHLORIDE 0.9% FLUSH
3.0000 mL | Freq: Two times a day (BID) | INTRAVENOUS | Status: DC
  Administered 2020-12-25 – 2021-01-01 (×13): 3 mL via INTRAVENOUS

## 2020-12-25 MED ORDER — ASPIRIN EC 81 MG PO TBEC
81.0000 mg | DELAYED_RELEASE_TABLET | Freq: Every day | ORAL | Status: DC
  Administered 2020-12-25 – 2021-01-01 (×8): 81 mg via ORAL
  Filled 2020-12-25 (×8): qty 1

## 2020-12-25 MED ORDER — PANTOPRAZOLE SODIUM 40 MG PO TBEC
40.0000 mg | DELAYED_RELEASE_TABLET | Freq: Every day | ORAL | Status: DC
  Administered 2020-12-25 – 2021-01-01 (×8): 40 mg via ORAL
  Filled 2020-12-25 (×8): qty 1

## 2020-12-25 MED ORDER — ONDANSETRON HCL 4 MG/2ML IJ SOLN: 4.0000 mg | Freq: Four times a day (QID) | INTRAMUSCULAR | Status: DC | PRN

## 2020-12-25 MED ORDER — CARVEDILOL 6.25 MG PO TABS
6.2500 mg | ORAL_TABLET | Freq: Two times a day (BID) | ORAL | Status: DC
  Administered 2020-12-25 – 2021-01-01 (×14): 6.25 mg via ORAL
  Filled 2020-12-25 (×15): qty 1

## 2020-12-25 MED ORDER — FUROSEMIDE 10 MG/ML IJ SOLN
80.0000 mg | Freq: Once | INTRAMUSCULAR | Status: AC
  Administered 2020-12-25: 80 mg via INTRAVENOUS
  Filled 2020-12-25: qty 8

## 2020-12-25 MED ORDER — SODIUM CHLORIDE 0.9% FLUSH
3.0000 mL | INTRAVENOUS | Status: DC | PRN
  Administered 2020-12-31: 3 mL via INTRAVENOUS

## 2020-12-25 MED ORDER — POTASSIUM CHLORIDE CRYS ER 20 MEQ PO TBCR
20.0000 meq | EXTENDED_RELEASE_TABLET | Freq: Every day | ORAL | Status: DC
  Administered 2020-12-25 – 2020-12-26 (×2): 20 meq via ORAL
  Filled 2020-12-25 (×2): qty 1

## 2020-12-25 MED ORDER — FUROSEMIDE 10 MG/ML IJ SOLN
40.0000 mg | Freq: Two times a day (BID) | INTRAMUSCULAR | Status: DC
  Administered 2020-12-25 – 2020-12-27 (×4): 40 mg via INTRAVENOUS
  Filled 2020-12-25 (×4): qty 4

## 2020-12-25 MED ORDER — GABAPENTIN 300 MG PO CAPS
300.0000 mg | ORAL_CAPSULE | Freq: Every day | ORAL | Status: DC
  Administered 2020-12-25 – 2020-12-31 (×7): 300 mg via ORAL
  Filled 2020-12-25 (×9): qty 1

## 2020-12-25 MED ORDER — ENOXAPARIN SODIUM 60 MG/0.6ML ~~LOC~~ SOLN
0.5000 mg/kg | SUBCUTANEOUS | Status: DC
  Administered 2020-12-25 – 2020-12-30 (×6): 57.5 mg via SUBCUTANEOUS
  Filled 2020-12-25 (×8): qty 0.6

## 2020-12-25 MED ORDER — LOSARTAN POTASSIUM 25 MG PO TABS: 12.5000 mg | ORAL_TABLET | Freq: Every day | ORAL | Status: DC

## 2020-12-25 MED ORDER — ISOSORB DINITRATE-HYDRALAZINE 20-37.5 MG PO TABS
1.0000 | ORAL_TABLET | Freq: Three times a day (TID) | ORAL | Status: DC
  Administered 2020-12-25 – 2021-01-01 (×19): 1 via ORAL
  Filled 2020-12-25 (×26): qty 1

## 2020-12-25 NOTE — ED Triage Notes (Signed)
Pt to ER via EMS from home with c/o [facility] and bilateral leg swelling.  Pt states this has been ongoing since his last hospitalization several weeks ago.  Pt states this AM the [facility] is worse and his daughter was worried.

## 2020-12-25 NOTE — Consult Note (Signed)
Cardiology Consultation:   Patient ID: Dale [name]; [id number]; 1961-04-11   Admit date: 12/25/2020 Date of Consult: 12/25/2020  Primary Care Provider: Vesta Mixer, MD Primary Cardiologist: Nahser Primary Electrophysiologist:  None   Patient Profile:   Dale [name] is a 60 y.o. 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 Joylene Igo.  History of Present Illness:   Dale [name] underwent echo in 09/2018 that showed an EF of 20-25%, midl LVH and LVE, moderate diastolic dysfunction, trace AI, mild MR, biatrial enlargement, and mild RV dysfunction and enlargement. He left AMA. Repeat echo in 03/2019 showed a persistent cardiomyopathy with an EF of 20-25%. He underwent R/LHC in 05/2019 that showed severe one-vessel CAD with CTO of the proximal RCA, mild to moderate nonobstructive disease involving the LAD and LCx, moderately elevated left heart filling pressures and severely elevated right heart filling pressures, mild pulmonary hypertension, and normal cardiac output/index. Follow up has been limited at time with noncompliance.   He was admitted in 10/2020 with CHF exacerbation in the context of ongoing cocaine use.   He was recently admitted to the hospital earlier this month for acute on chronic combined systolic and diastolic. Echo showed an EF < 20%. Symptoms were improving with IV diuresis, though he remained significantly volume up at time of discharge as he insisted he wanted to go home. He was discharged on higher dose torsemide 40 mg bid, BiDIl, losartan, Coreg, Lipitor, and ASA. Last documented weight during that admission of 267 pounds with a reported baseline weight of 245 pounds.   He  returned to [facility] on 12/25/2020 with worsening dyspnea and lower extremity swelling with associated persistent orthopnea. He has reported adherence to his medications, though was only taking torsemide 40 mg once daily, not twice. He denies any cocaine use in the past 3 weeks. He reports drinking < 2 L and monitoring his salt intake.   Upon the patient's arrival to [facility] they were found to have stable vitals, oxygen saturation 98% on room air, weight documented at 250 pounds. EKG showed NSR, 93 bpm, occasional PVCs in a pattern of bigeminy, LVH, nonspecific st/t changes, CXR showed cardiomegaly with mild pulmonary vascular congestion. Labs showed a HS-Tn of 30 with a delta troponin of 29, BNP 2743 which is improved from his prior BNP during admission earlier this month, drug screen pending, HGB low though stable at 10.4, BUN/SCr 39/2.21 with a baseline SCr around 1.2-1.4, potassium 3.3, Covid negative. In the ED, he was given IV Lasix 80 mg with no documented UOP for review, though bedside urinal is full in the ED. He notes some mild improvement in dyspnea since his arrival.     Past Medical History:  Diagnosis Date  . Alcohol abuse   . Chronic combined systolic and diastolic CHF (congestive heart failure) (HCC)    a. 09/27/18 showed mild LVH, EF 20-25%, grade 2 DD, mild MR, severely dilated LA, mildly dilated RV with mildly reduced RV function, mod RAE.  Marland Kitchen Cocaine abuse (HCC)   . Hypertension     Past Surgical History:  Procedure Laterality Date  . RIGHT/LEFT HEART CATH AND CORONARY ANGIOGRAPHY N/A 05/10/2019   Procedure: RIGHT/LEFT HEART CATH AND CORONARY ANGIOGRAPHY;  Surgeon: Yvonne KendallEnd, Christopher, MD;  Location: [facility] INVASIVE CV LAB;  Service: Cardiovascular;  Laterality: N/A;     Home Meds: Prior to Admission medications   Medication Sig Start Date End Date Taking? Authorizing Provider  aspirin EC 81 MG EC tablet Take 1 tablet (81 mg total) by mouth daily. Swallow whole. 12/17/20   Arnetha CourserAmin, Sumayya, MD   atorvastatin (LIPITOR) 40 MG tablet Take 1 tablet (40 mg total) by mouth daily. 12/17/20 01/16/21  Arnetha CourserAmin, Sumayya, MD  carvedilol (COREG) 6.25 MG tablet Take 1 tablet (6.25 mg total) by mouth 2 (two) times daily with a meal. 12/17/20 01/16/21  Arnetha CourserAmin, Sumayya, MD  diclofenac sodium (VOLTAREN) 1 % GEL Apply 2 g topically 4 (four) times daily as needed (pain).    [provider]  gabapentin (NEURONTIN) 300 MG capsule Take 1 capsule (300 mg total) by mouth at bedtime. 10/21/20   Noralee Stainhoi, Jennifer, DO  isosorbide-hydrALAZINE (BIDIL) 20-37.5 MG tablet Take 1 tablet by mouth 3 (three) times daily. 12/17/20   Arnetha CourserAmin, Sumayya, MD  losartan (COZAAR) 25 MG tablet Take 0.5 tablets (12.5 mg total) by mouth daily. 12/17/20   Arnetha CourserAmin, Sumayya, MD  pantoprazole (PROTONIX) 40 MG tablet Take 1 tablet (40 mg total) by mouth daily. 10/21/20   Noralee Stainhoi, Jennifer, DO  potassium chloride SA (KLOR-CON) 20 MEQ tablet Take 1 tablet (20 mEq total) by mouth daily. 12/11/20   Nahser, Deloris PingPhilip J, MD  torsemide 40 MG TABS Take 40 mg by mouth 2 (two) times daily. 12/17/20   Arnetha CourserAmin, Sumayya, MD    Inpatient Medications: Scheduled Meds: . aspirin EC  81 mg Oral Daily  . atorvastatin  40 mg Oral Daily  . carvedilol  6.25 mg Oral BID WC  . enoxaparin (LOVENOX) injection  0.5 mg/kg Subcutaneous Q24H  . furosemide  40 mg Intravenous BID  . gabapentin  300 mg Oral QHS  . isosorbide-hydrALAZINE  1 tablet Oral TID  . pantoprazole  40 mg Oral Daily  . potassium chloride SA  20 mEq Oral Daily  . sodium chloride flush  3 mL Intravenous Q12H   Continuous Infusions: . sodium chloride     PRN Meds: sodium chloride, acetaminophen, ondansetron (ZOFRAN) IV, sodium chloride flush  Allergies:   Allergies  Allergen Reactions  . Hydrocodone Itching    Social History:   Social History   Socioeconomic History  . Marital status: Single    Spouse name: none  . Number of children: Not on file  . Years of education: Not on file  . Highest  education level: Not on file  Occupational History  . Occupation: Disabled  Tobacco Use  . Smoking status: Never Smoker  . Smokeless tobacco: Never Used  Vaping Use  . Vaping Use: Never used  Substance and Sexual Activity  . Alcohol use: Yes    Comment: 2 quarts a week   . Drug use: Yes    Frequency: 3.0 times per week    Types: Marijuana, Cocaine  . Sexual activity: Not Currently  Other Topics Concern  . Not on file  Social History Narrative  . Not on file   Social Determinants of Health   Financial Resource Strain: Medium Risk  . Difficulty of Paying Living Expenses: Somewhat hard  Food Insecurity: Food Insecurity Present  . Worried About Programme researcher, broadcasting/film/videounning Out of Food in the Last Year: Sometimes true  . Ran Out of Food in the Last Year: Sometimes true  Transportation Needs: Unmet Transportation Needs  . Lack of Transportation (Medical): Yes  .  Lack of Transportation (Non-Medical): Yes  Physical Activity: Not on file  Stress: Not on file  Social Connections: Not on file  Intimate Partner Violence: Not on file     Family History:   Family History  Problem Relation Age of Onset  . Hypertension Maternal Grandmother     ROS:  Review of Systems  Constitutional: Positive for malaise/fatigue. Negative for chills, diaphoresis, fever and weight loss.  HENT: Negative for congestion.   Eyes: Negative for discharge and redness.  Respiratory: Positive for shortness of breath. Negative for cough, sputum production and wheezing.   Cardiovascular: Positive for orthopnea and leg swelling. Negative for chest pain, palpitations, claudication and PND.  Gastrointestinal: Negative for abdominal pain, heartburn, nausea and vomiting.  Musculoskeletal: Negative for falls and myalgias.  Skin: Negative for rash.  Neurological: Positive for weakness. Negative for dizziness, tingling, tremors, sensory change, speech change, focal weakness and loss of consciousness.  Endo/Heme/Allergies: Does not  bruise/bleed easily.  Psychiatric/Behavioral: Negative for substance abuse. The patient is not nervous/anxious.   All other systems reviewed and are negative.     Physical Exam/Data:   Vitals:   12/25/20 0733 12/25/20 0734 12/25/20 0800  BP: 125/84  125/88  Pulse: 82  90  Resp: 20  (!) 39  Temp: 97.6 F (36.4 C)    TempSrc: Oral    SpO2: 98%  96%  Weight:  113.4 kg   Height:  5\' 11"  (1.803 m)    No intake or output data in the 24 hours ending 12/25/20 1039 Filed Weights   12/25/20 0734  Weight: 113.4 kg   Body mass index is 34.87 kg/m.   Physical Exam: General: Well developed, well nourished, in no acute distress. Head: Normocephalic, atraumatic, sclera non-icteric, no xanthomas, nares without discharge.  Neck: Negative for carotid bruits. JVD elevated to the angle of the mandible. Lungs: Diminished breath sounds along the bilateral bases. Breathing is unlabored. Heart: RRR with S1 S2. I/VI systolic murmur, no rubs, or gallops appreciated. Abdomen: Soft, non-tender, non-distended with normoactive bowel sounds. No hepatomegaly. No rebound/guarding. No obvious abdominal masses. Msk:  Strength and tone appear normal for age. Extremities: No clubbing or cyanosis. 2+ bilateral lower extremity edema. Distal pedal pulses are 2+ and equal bilaterally. Neuro: Alert and oriented X 3. No facial asymmetry. No focal deficit. Moves all extremities spontaneously. Psych:  Responds to questions appropriately with a normal affect.   EKG:  The EKG was personally reviewed and demonstrates: NSR, 93 bpm, occasional PVCs in a pattern of bigeminy, LVH, nonspecific st/t changes Telemetry:  Telemetry was personally reviewed and demonstrates: SR  Weights: 12/27/20   12/25/20 0734  Weight: 113.4 kg    Relevant CV Studies:  2D echo 12/14/2020: 1. Left ventricular ejection fraction, by estimation, is <20%. The left  ventricle has severely decreased function. The left ventricle demonstrates   global hypokinesis. The left ventricular internal cavity size was severely  dilated. Left ventricular  diastolic parameters are indeterminate.  2. Right ventricular systolic function is moderately reduced. The right  ventricular size is mildly enlarged. There is normal pulmonary artery  systolic pressure.  3. The mitral valve is normal in structure. Mild mitral valve  Regurgitation. __________  2D echo 09/2020: 1. Left ventricular ejection fraction, by estimation, is approximately  20%. The left ventricle has severely decreased function. The left  ventricle demonstrates global hypokinesis with regional variation as  noted. The left ventricular internal cavity  size was moderately dilated. Left ventricular diastolic parameters are  consistent with Grade III diastolic dysfunction (restrictive). No obvious  mural thrombus noted with Definity contrast.  2. Right ventricular systolic function is moderately to severely reduced.  The right ventricular size is normal. There is moderately elevated  pulmonary artery systolic pressure. The estimated right ventricular  systolic pressure is 52.0 mmHg.  3. Left atrial size was mild to moderately dilated.  4. Right atrial size was mild to moderately dilated.  5. The mitral valve is grossly normal. Mild mitral valve regurgitation.  6. The aortic valve is tricuspid. Aortic valve regurgitation is trivial.  7. The inferior vena cava is dilated in size with <50% respiratory  variability, suggesting right atrial pressure of 15 mmHg. __________  [facility] 05/2019: Conclusions: 1. Severe single-vessel coronary artery disease with chronic total occlusion of the proximal RCA. 2. Mild to moderate, non-obstructive coronary artery disease involving the LAD and LCx. 3. Moderately elevated left heart filling pressures. 4. Severely elevated right heart filling pressures. 5. Mild pulmonary hypertension. 6. Normal Fick cardiac  output/index.  Recommendations: 1. 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  Patient will be transported back to [facility] after recovery from today's procedure. 2. Continue diuresis and optimization of evidence-based heart failure therapy.  I will restart furosemide 40 mg IV BID this afternoon. 3. Secondary prevention of coronary artery disease, including high-intensity statin therapy and avoidance of cocaine. __________  2D echo 03/2019: 1. The left ventricle has severely reduced systolic function, with an  ejection fraction of 20-25%. The cavity size was moderately dilated. There  is mildly increased left ventricular wall thickness. Left ventricular  diastolic Doppler parameters are  consistent with restrictive filling. Elevated left ventricular  end-diastolic pressure.  2. Diffuse hypokinesis worse in the inferior wall.  3. The right ventricle has normal systolic function. The cavity was  moderately enlarged. There is no increase in right ventricular wall  thickness.  4. Right atrial size was mildly dilated.  5. Mild thickening of the mitral valve leaflet.  6. The aortic valve is tricuspid. Mild thickening of the aortic valve.  Mild calcification of the aortic valve.  __________  2D echo 09/2018: - Left ventricle: The cavity size was mildly dilated. Wall  thickness was increased in a pattern of mild LVH. Systolic  function was severely reduced. The estimated ejection fraction  was in the range of 20% to 25%. Diffuse hypokinesis. Features are  consistent with a pseudonormal left ventricular filling pattern,  with concomitant abnormal relaxation and increased filling  pressure (grade 2 diastolic dysfunction). Doppler parameters are  consistent with high ventricular filling pressure.  - Aortic valve: There was trivial regurgitation.  - Mitral valve: There was mild regurgitation.  - Left atrium: The atrium was severely  dilated.  - Right ventricle: The cavity size was mildly dilated. Systolic  function was mildly reduced.  - Right atrium: The atrium was moderately dilated.  - Pericardium, extracardiac: A trivial pericardial effusion was  identified.   Impressions:   - Severe global reduction in LV systolic function; mild LVH and  LVE; moderate diastolic dysfunction; trace AI; mild MR; biatrial  enlargement; mild RVE with mild RV dysfunction.    Laboratory Data:  Chemistry Recent Labs  Lab 12/25/20 0725  NA 134*  K 3.3*  CL 98  CO2 27  GLUCOSE 150*  BUN 39*  CREATININE 2.21*  CALCIUM 8.6*  GFRNONAA 34*  ANIONGAP 9    Recent Labs  Lab 12/25/20 0725  PROT 7.2  ALBUMIN 3.4*  AST 20  ALT 22  ALKPHOS 102  BILITOT 1.1   Hematology Recent Labs  Lab 12/25/20 0725  WBC 6.6  RBC 4.10*  HGB 10.4*  HCT 32.0*  MCV 78.0*  MCH 25.4*  MCHC 32.5  RDW 17.1*  PLT 271   Cardiac EnzymesNo results for input(s): TROPONINI in the last 168 hours. No results for input(s): TROPIPOC in the last 168 hours.  BNP Recent Labs  Lab 12/25/20 0725  BNP 2,743.6*    DDimer No results for input(s): DDIMER in the last 168 hours.  Radiology/Studies:  DG Chest Portable 1 View  Result Date: 12/25/2020 IMPRESSION: Cardiomegaly with mild pulmonary vascular congestion. Electronically Signed   By: Feliberto Harts MD   On: 12/25/2020 07:52    Assessment and Plan:   1. 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: -He was recently admitted and discharged on 2/13 despite still being volume up as he was insistent on being discharged at that time -Torsemide was increased to 40 mg bid at discharge, though he was only taking this once daily -IV Lasix 40 mg bid with KCl repletion, if renal function does not improve with IV Lasix, he will likely need milrinone gtt over the next 24 hours -Bedside urinal is full in the ED -Limited echo earlier this month with an EF  less than 20% largely consistent with prior study -Coreg, BiDil, losartan as BP allow -Not on MRA with underlying CKD, which is now worse -Look to add SGLT2i and transition to [facility] in outpatient follow up as able -Would benefit from establishing with the advanced heart failure team in [place] as an outpatient  -CHF education -Daily weights -Strict I/O  2. CAD involving the native coronary arteries with elevated troponin: -Minimal flat trending HS-Tn, not consistent with ACS -No indication for heparin gtt -Recent echo as above -ASA, Coreg, Lipitor  -Recommend outpatient ischemic evaluation once his respiratory status and renal function have improved   3. Acute on CKD stage II with likely cardiorenal syndrome: -Likely a component of cardiorenal syndrome with low output heart failure -Monitor renal function closely with IV diuresis, if he does not have good UOP or if renal function worsens with diuresis, he may need milrinone gtt to augment diuresis   4. HTN: -Blood pressure currently well controlled -Continue medications as outlined above  5. HLD: -LDL 111 in 09/2020 with goal LDL < 70 -Continue Lipitor -Outpatient follow up  6. Anemia: -Likely of chronic disease  -Stable  7. Polysubstance abuse: -Urine drug screen pending -He indicates he last used cocaine 3 weeks ago -Complete cessation is advised   8. PVCs: -Coreg as above -Replete potassium to goal 4.0 -Check magnesium with recommendation to replete to goal 2.0 -Possibly in the context of his cardiomyopathy, though could also be contributing to his cardiomyopathy    For questions or updates, please contact [facility] Please consult [email or web address] for contact info under Cardiology/STEMI.   Signed, Eula Listen, PA-C [facility] Pager: [phone] 12/25/2020, 10:39 AM

## 2020-12-25 NOTE — H&P (Signed)
History and Physical    Dale [name] GHW:[id number] DOB: 06-14-1961 DOA: 12/25/2020  PCP: Vesta Mixer, MD   Patient coming from: Home  I have personally briefly reviewed patient's old medical records in [facility]  Chief Complaint: Shortness of breath  HPI: Dale [name] is a 60 y.o. male with medical history significant for chronic systolic heart failure with last known LVEF from 01/22 of less than 20%, hypertension, polysubstance abuse and obesity who presents to the ER via EMS for evaluation of worsening shortness of breath and bilateral leg swelling.  Patient was discharged from the hospital over a week ago and states that since his discharge he has remained short of breath with moderate exertion but now he is short of breath with mild exertion and at rest.  This is associated with orthopnea as well as worsening lower extremity swelling.  He admits to being compliant with his medications and 2 L fluid restriction and denies any dietary indiscretion.  He has a history of cocaine dependence but states that he has not used any in 2 weeks but uses marijuana occasionally. He denies having any chest pain, no nausea, no vomiting, no diaphoresis, no palpitations, no abdominal pain, no diarrhea, no constipation, no headache, no fever, no chills, no dizziness, no lightheadedness, no urinary frequency, no nocturia, no dysuria, no focal deficits, no weakness, no blurred vision or sore throat. Labs show sodium 134, potassium 3.3, chloride 98, bicarb 27, glucose 150, BUN 39, creatinine 2.21 above a baseline of 1.49, calcium 8.6, alkaline phosphatase 102, albumin 3.4, AST 20, ALT 22, total protein 7.2, BNP 2743, troponin 30, white count 6.6, hemoglobin 10.4, hematocrit 32.0, MCV 78, RDW 17.1, platelet count 271 Respiratory viral panel is negative Chest x-ray reviewed by me shows cardiomegaly with mild pulmonary vascular congestion. Twelve-lead EKG reviewed by me shows sinus rhythm with ventricular  bigeminy and LVH   ED Course: Patient is a 60 year old African-American male with a history of chronic systolic CHF who presents to the ER for evaluation of worsening shortness of breath, orthopnea and bilateral lower extremity swelling.  His BNP is elevated and chest x-ray is suggestive of pulmonary congestion.  Patient received a dose of Lasix 80 mg IV in the ER and will be admitted to the hospital for further evaluation.   Review of Systems: As per HPI otherwise all other systems reviewed and negative.    Past Medical History:  Diagnosis Date  . Alcohol abuse   . Chronic combined systolic and diastolic CHF (congestive heart failure) (HCC)    a. 09/27/18 showed mild LVH, EF 20-25%, grade 2 DD, mild MR, severely dilated LA, mildly dilated RV with mildly reduced RV function, mod RAE.  Marland Kitchen Cocaine abuse (HCC)   . Hypertension     Past Surgical History:  Procedure Laterality Date  . RIGHT/LEFT HEART CATH AND CORONARY ANGIOGRAPHY N/A 05/10/2019   Procedure: RIGHT/LEFT HEART CATH AND CORONARY ANGIOGRAPHY;  Surgeon: Yvonne Kendall, MD;  Location: [facility];  Service: Cardiovascular;  Laterality: N/A;     reports that he has never smoked. He has never used smokeless tobacco. He reports current alcohol use. He reports current drug use. Frequency: 3.00 times per week. Drugs: Marijuana and Cocaine.  Allergies  Allergen Reactions  . Hydrocodone Itching    Family History  Problem Relation Age of Onset  . Hypertension Maternal Grandmother       Prior to Admission medications   Medication Sig Start Date End Date Taking? Authorizing Provider  aspirin EC 81 MG EC tablet Take 1 tablet (81 mg total) by mouth daily. Swallow whole. 12/17/20   Arnetha Courser, MD  atorvastatin (LIPITOR) 40 MG tablet Take 1 tablet (40 mg total) by mouth daily. 12/17/20 01/16/21  Arnetha Courser, MD  carvedilol (COREG) 6.25 MG tablet Take 1 tablet (6.25 mg total) by mouth 2 (two) times daily with a meal. 12/17/20  01/16/21  Arnetha Courser, MD  diclofenac sodium (VOLTAREN) 1 % GEL Apply 2 g topically 4 (four) times daily as needed (pain).    [provider]  gabapentin (NEURONTIN) 300 MG capsule Take 1 capsule (300 mg total) by mouth at bedtime. 10/21/20   Noralee Stain, DO  isosorbide-hydrALAZINE (BIDIL) 20-37.5 MG tablet Take 1 tablet by mouth 3 (three) times daily. 12/17/20   Arnetha Courser, MD  losartan (COZAAR) 25 MG tablet Take 0.5 tablets (12.5 mg total) by mouth daily. 12/17/20   Arnetha Courser, MD  pantoprazole (PROTONIX) 40 MG tablet Take 1 tablet (40 mg total) by mouth daily. 10/21/20   Noralee Stain, DO  potassium chloride SA (KLOR-CON) 20 MEQ tablet Take 1 tablet (20 mEq total) by mouth daily. 12/11/20   Nahser, Deloris Ping, MD  torsemide 40 MG TABS Take 40 mg by mouth 2 (two) times daily. 12/17/20   Arnetha Courser, MD    Physical Exam: Vitals:   12/25/20 0733 12/25/20 0734 12/25/20 0800  BP: 125/84  125/88  Pulse: 82  90  Resp: 20  (!) 39  Temp: 97.6 F (36.4 C)    TempSrc: Oral    SpO2: 98%  96%  Weight:  113.4 kg   Height:  5\' 11"  (1.803 m)      Vitals:   12/25/20 0733 12/25/20 0734 12/25/20 0800  BP: 125/84  125/88  Pulse: 82  90  Resp: 20  (!) 39  Temp: 97.6 F (36.4 C)    TempSrc: Oral    SpO2: 98%  96%  Weight:  113.4 kg   Height:  5\' 11"  (1.803 m)       Constitutional: Alert  and oriented x 3 .  Has conversational dyspnea.  Looks older than stated age HEENT:      Head: Normocephalic and atraumatic.         Eyes: PERLA, EOMI, Conjunctivae pallor. Sclera is non-icteric.       Mouth/Throat: Mucous membranes are moist.       Neck: Supple with no signs of meningismus. Cardiovascular: Regular rate and rhythm. No murmurs, gallops, or rubs. 2+ symmetrical distal pulses are present . No JVD. 3+ LE edema Respiratory:  Bilateral air entry. Faint crackles at the bases lungs  Gastrointestinal: Soft, non tender, and non distended with positive bowel sounds.  Central  adiposity Genitourinary: No CVA tenderness. Musculoskeletal: Nontender with normal range of motion in all extremities. No cyanosis, or erythema of extremities. Neurologic:  Face is symmetric. Moving all extremities. No gross focal neurologic deficits  Skin: Skin is warm, dry.  No rash or ulcers Psychiatric: Mood and affect are normal   Labs on Admission: I have personally reviewed following labs and imaging studies  CBC: Recent Labs  Lab 12/25/20 0725  WBC 6.6  NEUTROABS 4.8  HGB 10.4*  HCT 32.0*  MCV 78.0*  PLT 271   Basic Metabolic Panel: Recent Labs  Lab 12/25/20 0725  NA 134*  K 3.3*  CL 98  CO2 27  GLUCOSE 150*  BUN 39*  CREATININE 2.21*  CALCIUM 8.6*   GFR: Estimated Creatinine  Clearance: 47.2 mL/min (A) (by C-G formula based on SCr of 2.21 mg/dL (H)). Liver Function Tests: Recent Labs  Lab 12/25/20 0725  AST 20  ALT 22  ALKPHOS 102  BILITOT 1.1  PROT 7.2  ALBUMIN 3.4*   No results for input(s): LIPASE, AMYLASE in the last 168 hours. No results for input(s): AMMONIA in the last 168 hours. Coagulation Profile: No results for input(s): INR, PROTIME in the last 168 hours. Cardiac Enzymes: No results for input(s): CKTOTAL, CKMB, CKMBINDEX, TROPONINI in the last 168 hours. BNP (last 3 results) No results for input(s): PROBNP in the last 8760 hours. HbA1C: No results for input(s): HGBA1C in the last 72 hours. CBG: No results for input(s): GLUCAP in the last 168 hours. Lipid Profile: No results for input(s): CHOL, HDL, LDLCALC, TRIG, CHOLHDL, LDLDIRECT in the last 72 hours. Thyroid Function Tests: No results for input(s): TSH, T4TOTAL, FREET4, T3FREE, THYROIDAB in the last 72 hours. Anemia Panel: No results for input(s): VITAMINB12, FOLATE, FERRITIN, TIBC, IRON, RETICCTPCT in the last 72 hours. Urine analysis:    Component Value Date/Time   COLORURINE COLORLESS (A) 02/28/2019 1309   APPEARANCEUR CLEAR 02/28/2019 1309   LABSPEC 1.004 (L) 02/28/2019  1309   PHURINE 7.0 02/28/2019 1309   GLUCOSEU NEGATIVE 02/28/2019 1309   HGBUR NEGATIVE 02/28/2019 1309   BILIRUBINUR NEGATIVE 02/28/2019 1309   KETONESUR NEGATIVE 02/28/2019 1309   PROTEINUR NEGATIVE 02/28/2019 1309   UROBILINOGEN 0.2 03/11/2016 0934   NITRITE NEGATIVE 02/28/2019 1309   LEUKOCYTESUR NEGATIVE 02/28/2019 1309    Radiological Exams on Admission: DG Chest Portable 1 View  Result Date: 12/25/2020 CLINICAL DATA:  Shortness of breath. EXAM: PORTABLE CHEST 1 VIEW COMPARISON:  December 12, 2020. FINDINGS: Similar enlarged cardiac silhouette. Mild pulmonary vascular congestion. The lungs are clear. No visible pleural effusions or pneumothorax on this single semi-erect radiograph. No acute osseous abnormality. IMPRESSION: Cardiomegaly with mild pulmonary vascular congestion. Electronically Signed   By: Feliberto Harts MD   On: 12/25/2020 07:52     Assessment/Plan Principal Problem:   Acute on chronic systolic CHF (congestive heart failure) (HCC) Active Problems:   Essential hypertension   Obesity   Substance abuse (HCC)   AKI (acute kidney injury) (HCC)     Acute on chronic systolic CHF Patient presents for evaluation of worsening shortness of breath from his baseline associated with orthopnea and bilateral lower extremity swelling. Last known LVEF from 01/22 was less than 20% Continue carvedilol, hydralazine and nitrates Hold losartan due to acute kidney injury  Place patient on furosemide 40 mg IV twice daily Maintain 1.5 L fluid restriction as well as low-sodium diet  We will request cardiology consult    Hypertension Continue carvedilol, hydralazine and nitrates     Acute kidney injury Patient at baseline has a serum creatinine of 1.4 today on admission it is 2.2 Hold losartan due to AKI Monitor renal function closely    Polysubstance abuse Patient has been advised to abstain from further illicit drug use Per patient he was scheduled to going to  rehab but has been unable to due to his frequent hospitalizations from CHF exacerbation   Obesity (BMI 34) Complicates overall prognosis  DVT prophylaxis: Lovenox Code Status: full code Family Communication: Greater than 50% of time was spent discussing patient's condition and plan of care with him at the bedside.  All questions and concerns have been addressed.  He verbalizes understanding and agrees with the plan. Disposition Plan: Back to previous home environment Consults called:  Cardiology Status: Inpatient.  The medical decision making for this patient was of high complexity and patient is at high risk for clinical deterioration during this hospitalization.    Lucile Shutters MD [facility]     12/25/2020, 10:16 AM

## 2020-12-25 NOTE — ED Provider Notes (Addendum)
[facility] Emergency Department Provider Note ____________________________________________   Event Date/Time   First MD Initiated Contact with Patient 12/25/20 0710     (approximate)  I have reviewed the triage vital signs and the nursing notes.   HISTORY  Chief Complaint No chief complaint on file.    HPI Dale [name] is a 60 y.o. male with PMH as noted below including CHF, CAD, hypertension, and cocaine abuse who presents with shortness of breath over the last several days, persistent course, and exacerbated by even minimal exertion.  The patient states that he feels short of breath even if he takes a few steps.  He states that this has been the case since he left the hospital last week but worse in the last few days.  He states he was with his daughter this morning who was concerned about how short of breath he was and wanted him to come get seen.  He reports one episode of sharp chest pain early this morning but this has resolved.  He has some leg swelling.  He denies cough, fever, vomiting, or other acute symptoms.  Past Medical History:  Diagnosis Date  . Alcohol abuse   . Chronic combined systolic and diastolic CHF (congestive heart failure) (HCC)    a. 09/27/18 showed mild LVH, EF 20-25%, grade 2 DD, mild MR, severely dilated LA, mildly dilated RV with mildly reduced RV function, mod RAE.  Marland Kitchen Cocaine abuse (HCC)   . Hypertension     Patient Active Problem List   Diagnosis Date Noted  . Coronary artery disease involving native coronary artery of native heart without angina pectoris   . AKI (acute kidney injury) (HCC) 10/16/2020  . 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 Acute on chronic combined systolic (congestive) and diastolic (congestive) heart failure (HCC)   . Acute CHF (congestive heart failure) (HCC) 05/07/2019  . Nausea & vomiting 05/07/2019  . Acute exacerbation of CHF (congestive heart  failure) (HCC) 03/11/2019  . Troponin level elevated 03/11/2019  . Hyperlipidemia 03/03/2019  . NSVT (nonsustained ventricular tachycardia) (HCC) 03/03/2019  . Anxiety and depression   . NSTEMI (non-ST elevated myocardial infarction) (HCC) 02/28/2019  . Acute systolic heart failure (HCC) 11/08/2018  . Hypertensive urgency 09/28/2018  . Acute pulmonary edema (HCC) 09/28/2018  . Cocaine abuse (HCC) 09/28/2018  . Substance abuse (HCC) 09/28/2018  . Chronic kidney disease, stage II (mild) 09/28/2018  . Normochromic anemia 09/28/2018  . Acute systolic CHF (congestive heart failure) (HCC) 09/27/2018  . Essential hypertension 03/11/2016  . Obesity 03/11/2016  . Glucosuria 03/11/2016    Past Surgical History:  Procedure Laterality Date  . RIGHT/LEFT HEART CATH AND CORONARY ANGIOGRAPHY N/A 05/10/2019   Procedure: RIGHT/LEFT HEART CATH AND CORONARY ANGIOGRAPHY;  Surgeon: Yvonne Kendall, MD;  Location: [facility] INVASIVE CV LAB;  Service: Cardiovascular;  Laterality: N/A;    Prior to Admission medications   Medication Sig Start Date End Date Taking? Authorizing Provider  aspirin EC 81 MG EC tablet Take 1 tablet (81 mg total) by mouth daily. Swallow whole. 12/17/20   Arnetha Courser, MD  atorvastatin (LIPITOR) 40 MG tablet Take 1 tablet (40 mg total) by mouth daily. 12/17/20 01/16/21  Arnetha Courser, MD  carvedilol (COREG) 6.25 MG tablet Take 1 tablet (6.25 mg total) by mouth 2 (two) times daily with a meal. 12/17/20 01/16/21  Arnetha Courser, MD  diclofenac sodium (VOLTAREN) 1 % GEL Apply 2 g topically 4 (four) times daily  as needed (pain).    [provider]  gabapentin (NEURONTIN) 300 MG capsule Take 1 capsule (300 mg total) by mouth at bedtime. 10/21/20   Noralee Stain, DO  isosorbide-hydrALAZINE (BIDIL) 20-37.5 MG tablet Take 1 tablet by mouth 3 (three) times daily. 12/17/20   Arnetha Courser, MD  losartan (COZAAR) 25 MG tablet Take 0.5 tablets (12.5 mg total) by mouth daily. 12/17/20   Arnetha Courser,  MD  pantoprazole (PROTONIX) 40 MG tablet Take 1 tablet (40 mg total) by mouth daily. 10/21/20   Noralee Stain, DO  potassium chloride SA (KLOR-CON) 20 MEQ tablet Take 1 tablet (20 mEq total) by mouth daily. 12/11/20   Nahser, Deloris Ping, MD  torsemide 40 MG TABS Take 40 mg by mouth 2 (two) times daily. 12/17/20   Arnetha Courser, MD    Allergies Hydrocodone  Family History  Problem Relation Age of Onset  . Hypertension Maternal Grandmother     Social History Social History   Tobacco Use  . Smoking status: Never Smoker  . Smokeless tobacco: Never Used  Vaping Use  . Vaping Use: Never used  Substance Use Topics  . Alcohol use: Yes    Comment: 2 quarts a week   . Drug use: Yes    Frequency: 3.0 times per week    Types: Marijuana, Cocaine    Review of Systems  Constitutional: No fever. Eyes: No visual changes. ENT: No sore throat. Cardiovascular: Positive for resolved chest pain. Respiratory: Positive for shortness of breath. Gastrointestinal: No vomiting or vomiting or diarrhea.  Genitourinary: Negative for dysuria.  Musculoskeletal: Negative for back pain. Skin: Negative for rash. Neurological: Negative for headache.   ____________________________________________   PHYSICAL EXAM:  VITAL SIGNS: ED Triage Vitals  Enc Vitals Group     BP      Pulse      Resp      Temp      Temp src      SpO2      Weight      Height      Head Circumference      Peak Flow      Pain Score      Pain Loc      Pain Edu?      Excl. in GC?     Constitutional: Alert and oriented.  Relatively well appearing, in no acute distress. Eyes: Conjunctivae are normal.  Head: Atraumatic. Nose: No congestion/rhinnorhea. Mouth/Throat: Mucous membranes are moist.   Neck: Normal range of motion.  Cardiovascular: Normal rate, regular rhythm. Grossly normal heart sounds.  Good peripheral circulation. Respiratory: Slightly increased respiratory effort.  No retractions. Lungs  CTAB. Gastrointestinal: Soft and nontender. No distention.  Genitourinary: No flank tenderness. Musculoskeletal: 1+ bilateral lower extremity edema.  Extremities warm and well perfused.  Neurologic:  Normal speech and language. No gross focal neurologic deficits are appreciated.  Skin:  Skin is warm and dry. No rash noted. Psychiatric: Mood and affect are normal. Speech and behavior are normal.  ____________________________________________   LABS (all labs ordered are listed, but only abnormal results are displayed)  Labs Reviewed  BRAIN NATRIURETIC PEPTIDE - Abnormal; Notable for the following components:      Result Value   B Natriuretic Peptide 2,743.6 (*)    All other components within normal limits  CBC WITH DIFFERENTIAL/PLATELET - Abnormal; Notable for the following components:   RBC 4.10 (*)    Hemoglobin 10.4 (*)    HCT 32.0 (*)    MCV 78.0 (*)  MCH 25.4 (*)    RDW 17.1 (*)    All other components within normal limits  COMPREHENSIVE METABOLIC PANEL - Abnormal; Notable for the following components:   Sodium 134 (*)    Potassium 3.3 (*)    Glucose, Bld 150 (*)    BUN 39 (*)    Creatinine, Ser 2.21 (*)    Calcium 8.6 (*)    Albumin 3.4 (*)    GFR, Estimated 34 (*)    All other components within normal limits  TROPONIN I (HIGH SENSITIVITY) - Abnormal; Notable for the following components:   Troponin I (High Sensitivity) 30 (*)    All other components within normal limits  RESP PANEL BY RT-PCR (FLU A&B, COVID) ARPGX2  URINALYSIS, COMPLETE (UACMP) WITH MICROSCOPIC  URINE DRUG SCREEN, QUALITATIVE ([facility] ONLY)  TROPONIN I (HIGH SENSITIVITY)   ____________________________________________  EKG  ED ECG REPORT I, Dionne Bucy, the attending physician, personally viewed and interpreted this ECG.  Date: 12/25/2020 EKG Time: 07 17 Rate: 93 Rhythm: normal sinus rhythm with frequent PVCs QRS Axis: normal Intervals: Prolonged QTc ST/T Wave abnormalities:  Nonspecific T wave abnormalities laterally Narrative Interpretation: Nonspecific abnormalities with no evidence of acute ischemia  ____________________________________________  RADIOLOGY  Chest x-ray interpreted by me shows mild pulmonary vascular congestion  ____________________________________________   PROCEDURES  Procedure(s) performed: No  Procedures  Critical Care performed: No ____________________________________________   INITIAL IMPRESSION / ASSESSMENT AND PLAN / ED COURSE  Pertinent labs & imaging results that were available during my care of the patient were reviewed by me and considered in my medical decision making (see chart for details).  60 year old male with PMH as noted above including CHF, CAD, hypertension, and cocaine abuse presents with persistent shortness of breath even with minimal exertion.  I reviewed the past medical records in Epic; the patient was admitted earlier this month and discharged on 2/13.  He presented with worsening shortness of breath and lower extremity edema and was found to have an elevated troponin and BNP.  He was admitted for acute on chronic CHF and diuresed with Lasix.  He was discharged on an increased dose of torsemide.  On exam today, the patient is slightly uncomfortable appearing with somewhat increased work of breathing but no acute respiratory distress.  His vital signs are normal.  He initially came in on 2 L of O2 by nasal cannula, but taken off of oxygen O2 saturation is 98% on room air.  The lungs are clear.  There is trace to 1+ bilateral lower extremity edema.  Exam is otherwise unremarkable.  Differential includes acute on chronic CHF, ACS, bronchitis/bronchospasm, pneumonia, COVID-19.  We will obtain a chest x-ray, lab work-up including cardiac enzymes, and reassess.  ----------------------------------------- 9:38 AM on 12/25/2020 -----------------------------------------  Lab work-up is significant for elevated  BNP not quite as high as during his last admission.  Chest x-ray looks about the same.  Troponin is unchanged.  However, he does have AKI with a creatinine of 2.2 up from 1.4 last week.  On reassessment, the patient's O2 saturation is still in the 90s although when he talks it does drop down to the high 80s intermittently.  Due to the AKI as well as the significant shortness of breath with minimal exertion I will admit the patient for further management of acute CHF.  I have ordered IV Lasix.  I consulted Dr. Joylene Igo from the hospitalist service for admission.  ______________________________  Dale [name] was evaluated in Emergency Department on 12/25/2020 for  the symptoms described in the history of present illness. He was evaluated in the context of the global COVID-19 pandemic, which necessitated consideration that the patient might be at risk for infection with the SARS-CoV-2 virus that causes COVID-19. Institutional protocols and algorithms that pertain to the evaluation of patients at risk for COVID-19 are in a state of rapid change based on information released by regulatory bodies including the CDC and federal and state organizations. These policies and algorithms were followed during the patient's care in the ED.  ____________________________________________   FINAL CLINICAL IMPRESSION(S) / ED DIAGNOSES  Final diagnoses:  Acute on chronic congestive heart failure, unspecified heart failure type (HCC)  AKI (acute kidney injury) (HCC)      NEW MEDICATIONS STARTED DURING THIS VISIT:  New Prescriptions   No medications on file     Note:  This document was prepared using Dragon voice recognition software and may include unintentional dictation errors.      Dionne Bucy, MD 12/25/20 [id number]

## 2020-12-25 NOTE — Consult Note (Addendum)
   Heart Failure Nurse Navigator Note  HFrEF< 20%.  Moderately decreased right ventricular systolic function.  Mild mitral regurgitation by echocardiogram performed on February 10 of 2022.   He presented with increasing shortness of breath for the last several days along with lower extremity edema.  Chest x-ray revealed pulmonary congestion that was mild.  He was last discharged from the hospital on February 13 was still volume overloaded at that time but insisted on being discharged.  Comorbidities:  Hypertension Hyperlipidemia Chronic kidney disease Anemia Morbid obesity Coronary artery disease  He has a known history of substance abuse with cocaine use last using 3 weeks ago and also alcohol abuse.  Medications:  Aspirin 81 mg daily Atorvastatin 40 mg daily Coreg 6.25 mg twice daily Furosemide 40 mg IV twice daily Isosorbide/hydralazine 20/37.5 mg 1 tablet 3 times daily Potassium chloride 20 mEq daily  Labs:  Sodium 134, potassium 3.3, chloride 98, CO2 27, BUN 39, creatinine 2.21, BNP 2743, WBC 6.6, hemoglobin 10.4, hematocrit 32, platelet count 271   Assessment:  General-he is awake and alert lying in bed in no acute distress.  HEENT-pupils are equal, normocephalic,  Cardiac-heart tones of regular rate and rhythm.  Chest-breath sounds are diminished in the bases to posterior auscultation.  Abdomen is rounded nontender.  Musculoskeletal-feet edematous, legs 2+ edema  Neuro-speech is clear moves all extremities.  Psych-makes good eye contact is pleasant and appropriate.   He had just been discharged on February 13, he was still fluid overloaded at that time but demanded to be discharged.  He was not discharged with a scale, he was unable to weigh himself daily.  At first he stated that he was taking the [facility] as ordered but upon further questioning he states that he was only taking the Demadex 40 mg once a day as he did not want to hurt his kidneys and end up  on dialysis.  Explained to the patient that is why we do follow-up labs and why it is important to keep his follow-up appointments with his providers.  Asked him the last time he used cocaine and he states 3 weeks ago.  Also in reviewing his medications he does not have a medication bottle for losartan which he said he was taking a whole tablet of.  Also question how he takes hydralazine/isosorbide he states 1 tablet every 8 hours.  He did have an extra bottle of that medication but last month that had become lost and he was able to find that after another prescription was filled for it.  Discussed sodium restriction and fluid restriction.  He states that he had not gone over his sodium restriction but may have drank more raspberry/cranberry juice than he should have.  Went over his medications, the names and why he is taking those.  Plan to work on this daily so he will have a better understanding when he is discharged.   Tresa Endo RN, CHFN

## 2020-12-26 DIAGNOSIS — N179 Acute kidney failure, unspecified: Secondary | ICD-10-CM | POA: Diagnosis not present

## 2020-12-26 DIAGNOSIS — E785 Hyperlipidemia, unspecified: Secondary | ICD-10-CM | POA: Diagnosis not present

## 2020-12-26 DIAGNOSIS — E876 Hypokalemia: Secondary | ICD-10-CM | POA: Diagnosis not present

## 2020-12-26 DIAGNOSIS — I5023 Acute on chronic systolic (congestive) heart failure: Secondary | ICD-10-CM | POA: Diagnosis not present

## 2020-12-26 DIAGNOSIS — F1410 Cocaine abuse, uncomplicated: Secondary | ICD-10-CM

## 2020-12-26 LAB — BASIC METABOLIC PANEL
Anion gap: 10 (ref 5–15)
BUN: 41 mg/dL — ABNORMAL HIGH (ref 6–20)
CO2: 26 mmol/L (ref 22–32)
Calcium: 8.8 mg/dL — ABNORMAL LOW (ref 8.9–10.3)
Chloride: 102 mmol/L (ref 98–111)
Creatinine, Ser: 1.73 mg/dL — ABNORMAL HIGH (ref 0.61–1.24)
GFR, Estimated: 45 mL/min — ABNORMAL LOW (ref >60)
Glucose, Bld: 129 mg/dL — ABNORMAL HIGH (ref 70–99)
Potassium: 3.2 mmol/L — ABNORMAL LOW (ref 3.5–5.1)
Sodium: 138 mmol/L (ref 135–145)

## 2020-12-26 MED ORDER — POTASSIUM CHLORIDE CRYS ER 20 MEQ PO TBCR
40.0000 meq | EXTENDED_RELEASE_TABLET | Freq: Two times a day (BID) | ORAL | Status: DC
  Administered 2020-12-26 – 2021-01-01 (×11): 40 meq via ORAL
  Filled 2020-12-26 (×12): qty 2

## 2020-12-26 NOTE — Progress Notes (Addendum)
   Heart Failure Nurse Navigator Note  HFrEF<20% moderately decreased right ventricular systolic function.  Mild mitral regurgitation by echocardiogram performed on December 14, 2020.  Patient presented with increasing shortness of breath for the last several days along with lower extremity edema.  Chest x-ray revealed pulmonary congestion that was mild.  He had last been discharged from the hospital on February 13 and was told volume overloaded but at that time insisted on being discharged.  Comorbidities:  Hypertension Hyperlipidemia Chronic kidney disease Anemia Morbid obesity Coronary artery disease   He has a known history of substance abuse with cocaine, urinalysis on this admission was positive for cocaine and cannabis.  Medications:  Aspirin 81 mg daily Atorvastatin 40 mg daily Coreg 6.25 mg twice daily Furosemide 40 mg IV daily twice Isosorbide/hydralazine 20/37.5 mg 1 tablet 3 times a day Potassium chloride 20 mEq daily  On hold his losartan due to AKI.  Cardiology is also possibly starting SGL 2 inhibitor and Entresto as an outpatient.  Labs:  Sodium 138, potassium 3.2, chloride 102, CO2 26, BUN 41, creatinine 1.73 down from 2.21 of yesterday.  Intake 120 mL Output 2000 mL Weight appears inaccurate as admission weight was documented 113.4 kg and today's weight is 119.3. BMI is 36.67 Blood pressure 114/87   Assessment:  General-he is awake and alert sitting up at the bedside in no acute distress, he does have knee-high teds on.  HEENT-pupils are equal, sclera nonicteric, no JVD noted.  Cardiac-heart tones of regular rate and rhythm no murmurs or rubs appreciated.  Chest-breath sounds are clear to posterior auscultation.  Abdomen-soft and non-tender  Musculoskeletal-lower extremities remain edematous, does have knee-high Ted hose on.  Psych-is pleasant and appropriate makes good eye contact   Neurologic-speech is clear, moves all extremities without  difficulties.    Spent over 30 minutes with the patient discussing his medications and why he takes them.  So discussed with him that it  is just detrimental to his kidneys if he does not take his diuretic as ordered and ends up with fluid overload(Cardio-renal).  He voices understanding.  Also spent some time discussing his home situation which is not good he has stressed numerous times that he needs to get away from that situation.   Tresa Endo RN, CHFN

## 2020-12-26 NOTE — Progress Notes (Signed)
Progress Note  Patient Name: Dale [name] Date of Encounter: 12/26/2020  Primary Cardiologist: Nahser  Subjective   Dyspnea significantly improved.  No angina.  Documented urine output 1.8 L for the past 24 hours with a net -1.5 L for the admission.  Suspect weights are inaccurate as his weight trend has been from 113 kg to 119 kg over the past 24 hours.  Renal function has improved with IV diuresis.  Urine drug screen positive for THC and cocaine.  Inpatient Medications    Scheduled Meds: . aspirin EC  81 mg Oral Daily  . atorvastatin  40 mg Oral Daily  . carvedilol  6.25 mg Oral BID WC  . enoxaparin (LOVENOX) injection  0.5 mg/kg Subcutaneous Q24H  . furosemide  40 mg Intravenous BID  . gabapentin  300 mg Oral QHS  . isosorbide-hydrALAZINE  1 tablet Oral TID  . pantoprazole  40 mg Oral Daily  . potassium chloride SA  20 mEq Oral Daily  . sodium chloride flush  3 mL Intravenous Q12H   Continuous Infusions: . sodium chloride     PRN Meds: sodium chloride, acetaminophen, ondansetron (ZOFRAN) IV, sodium chloride flush   Vital Signs    Vitals:   12/26/20 0029 12/26/20 0342 12/26/20 0343 12/26/20 0722  BP: (!) 97/56 113/87  (!) 115/97  Pulse: 83 81  63  Resp: 20 18  20   Temp: 98.5 F (36.9 C) 98.3 F (36.8 C)  (!) 97.5 F (36.4 C)  TempSrc: Oral Oral  Oral  SpO2: 93% 99%  98%  Weight:   119.3 kg   Height:        Intake/Output Summary (Last 24 hours) at 12/26/2020 0957 Last data filed at 12/26/2020 0912 Gross per 24 hour  Intake 480 ml  Output 2275 ml  Net -1795 ml   Filed Weights   12/25/20 0734 12/26/20 0343  Weight: 113.4 kg 119.3 kg    Telemetry    Sinus rhythm with occasional PVCs and second-degree AV block type I noted in the overnight hours - Personally Reviewed  ECG    No new tracings - Personally Reviewed  Physical Exam   GEN: No acute distress.   Neck: No JVD. Cardiac: RRR, no murmurs, rubs, or gallops.  Respiratory:  Improving breath  sounds that are faintly diminished at the bilateral bases.  GI: Soft, nontender, non-distended.   MS:  Mild bilateral lower extremity edema; No deformity. Neuro:  Alert and oriented x 3; Nonfocal.  Psych: Normal affect.  Labs    Chemistry Recent Labs  Lab 12/25/20 0725 12/26/20 0438  NA 134* 138  K 3.3* 3.2*  CL 98 102  CO2 27 26  GLUCOSE 150* 129*  BUN 39* 41*  CREATININE 2.21* 1.73*  CALCIUM 8.6* 8.8*  PROT 7.2  --   ALBUMIN 3.4*  --   AST 20  --   ALT 22  --   ALKPHOS 102  --   BILITOT 1.1  --   GFRNONAA 34* 45*  ANIONGAP 9 10     Hematology Recent Labs  Lab 12/25/20 0725  WBC 6.6  RBC 4.10*  HGB 10.4*  HCT 32.0*  MCV 78.0*  MCH 25.4*  MCHC 32.5  RDW 17.1*  PLT 271    Cardiac EnzymesNo results for input(s): TROPONINI in the last 168 hours. No results for input(s): TROPIPOC in the last 168 hours.   BNP Recent Labs  Lab 12/25/20 0725  BNP 2,743.6*     DDimer  No results for input(s): DDIMER in the last 168 hours.   Radiology    DG Chest Portable 1 View  Result Date: 12/25/2020 IMPRESSION: Cardiomegaly with mild pulmonary vascular congestion. Electronically Signed   By: Feliberto Harts MD   On: 12/25/2020 07:52    Cardiac Studies   2D echo 12/14/2020: 1. Left ventricular ejection fraction, by estimation, is <20%. The left  ventricle has severely decreased function. The left ventricle demonstrates  global hypokinesis. The left ventricular internal cavity size was severely  dilated. Left ventricular  diastolic parameters are indeterminate.  2. Right ventricular systolic function is moderately reduced. The right  ventricular size is mildly enlarged. There is normal pulmonary artery  systolic pressure.  3. The mitral valve is normal in structure. Mild mitral valve  Regurgitation. __________  2D echo 09/2020: 1. Left ventricular ejection fraction, by estimation, is approximately  20%. The left ventricle has severely decreased function.  The left  ventricle demonstrates global hypokinesis with regional variation as  noted. The left ventricular internal cavity  size was moderately dilated. Left ventricular diastolic parameters are  consistent with Grade III diastolic dysfunction (restrictive). No obvious  mural thrombus noted with Definity contrast.  2. Right ventricular systolic function is moderately to severely reduced.  The right ventricular size is normal. There is moderately elevated  pulmonary artery systolic pressure. The estimated right ventricular  systolic pressure is 52.0 mmHg.  3. Left atrial size was mild to moderately dilated.  4. Right atrial size was mild to moderately dilated.  5. The mitral valve is grossly normal. Mild mitral valve regurgitation.  6. The aortic valve is tricuspid. Aortic valve regurgitation is trivial.  7. The inferior vena cava is dilated in size with <50% respiratory  variability, suggesting right atrial pressure of 15 mmHg. __________  [facility] 05/2019: Conclusions: 1. Severe single-vessel coronary artery disease with chronic total occlusion of the proximal RCA. 2. Mild to moderate, non-obstructive coronary artery disease involving the LAD and LCx. 3. Moderately elevated left heart filling pressures. 4. Severely elevated right heart filling pressures. 5. Mild pulmonary hypertension. 6. Normal Fick cardiac output/index.  Recommendations: 1. 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 Patient will be transported back to [facility] after recovery from today's procedure. 2. Continue diuresis and optimization of evidence-based heart failure therapy. I will restart furosemide 40 mg IV BID this afternoon. 3. Secondary prevention of coronary artery disease, including high-intensity statin therapy and avoidance of cocaine. __________  2D echo 03/2019: 1. The left ventricle has severely reduced systolic function, with an  ejection fraction of  20-25%. The cavity size was moderately dilated. There  is mildly increased left ventricular wall thickness. Left ventricular  diastolic Doppler parameters are  consistent with restrictive filling. Elevated left ventricular  end-diastolic pressure.  2. Diffuse hypokinesis worse in the inferior wall.  3. The right ventricle has normal systolic function. The cavity was  moderately enlarged. There is no increase in right ventricular wall  thickness.  4. Right atrial size was mildly dilated.  5. Mild thickening of the mitral valve leaflet.  6. The aortic valve is tricuspid. Mild thickening of the aortic valve.  Mild calcification of the aortic valve.  __________  2D echo 09/2018: - Left ventricle: The cavity size was mildly dilated. Wall  thickness was increased in a pattern of mild LVH. Systolic  function was severely reduced. The estimated ejection fraction  was in the range of 20% to 25%. Diffuse hypokinesis. Features are  consistent with  a pseudonormal left ventricular filling pattern,  with concomitant abnormal relaxation and increased filling  pressure (grade 2 diastolic dysfunction). Doppler parameters are  consistent with high ventricular filling pressure.  - Aortic valve: There was trivial regurgitation.  - Mitral valve: There was mild regurgitation.  - Left atrium: The atrium was severely dilated.  - Right ventricle: The cavity size was mildly dilated. Systolic  function was mildly reduced.  - Right atrium: The atrium was moderately dilated.  - Pericardium, extracardiac: A trivial pericardial effusion was  identified.   Impressions:   - Severe global reduction in LV systolic function; mild LVH and  LVE; moderate diastolic dysfunction; trace AI; mild MR; biatrial  enlargement; mild RVE with mild RV dysfunction.   Patient Profile     60 y.o. 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 Joylene Igo.  Assessment & Plan    1. 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: -He was recently admitted and discharged on 2/13 despite still being volume up as he was insistent on being discharged at that time -Torsemide was increased to 40 mg bid at discharge, though he was only taking this once daily -Continue IV Lasix 40 mg bid with KCl repletion, he will likely need a couple more days of inpatient diuresis -Limited echo earlier this month with an EF less than 20% largely consistent with prior study -Coreg and BiDil -Losartan on hold with AKI -Not on MRA with underlying CKD -Look to add SGLT2iand transition to [facility] outpatient follow upas able -Would benefit from establishing with the advanced heart failure team in [place] as an outpatient -CHF education -Daily weights -Strict I/O  2. CAD involving the native coronary arteries with elevated troponin: -Minimal flat trending HS-Tn, not consistent with ACS -No indication for heparin gtt -Recent echo as above -ASA, Coreg, Lipitor  -Recommend outpatient ischemic evaluation once his respiratory status and renal function have improved   3. Acute on CKD stage II with likely cardiorenal syndrome: -Improving with diuresis -Continue to monitor -If renal function worsens with diuresis and he is still felt to be volume overloaded may need to consider Lasix drip versus milrinone infusion, however currently this is not needed  4. HTN: -Blood pressure currently well controlled -Continue medications as outlined above  5. HLD: -LDL 111 in 09/2020 with goal LDL < 70 -Continue Lipitor -Outpatient follow up  6. Anemia: -Likely of chronic disease   -Stable at time of admission  7. Polysubstance abuse: -Urine drug screen positive for THC and cocaine this admission with patient indicating he last used cocaine approximately 3 weeks ago -Contributing to his overall morbidity -Complete cessation is advised   8. PVCs: -Coreg as above -Replete potassiumto goal 4.0, updated orders -Magnesium at goal -Possibly in the context of his cardiomyopathy, though could also be contributing to his cardiomyopathy   9.  Second-degree AV block type I: -Noted during the overnight hours, likely in sleeping -Asymptomatic -Recommend outpatient sleep study  For questions or updates, please contact [facility] Please consult [email or web address] for contact info under Cardiology/STEMI.    Signed, Eula Listen, PA-C [facility] Pager: [phone] 12/26/2020, 9:57 AM

## 2020-12-26 NOTE — Progress Notes (Signed)
Patient ID: Dale [name], male   DOB: 01/21/1961, 60 y.o.   MRN: [id number] [facility] PROGRESS NOTE  Dale [name] ZDG:[id number] DOB: Mar 14, 1961 DOA: 12/25/2020 PCP: Vesta Mixer, MD  HPI/Subjective: Patient feeling better than when he came in.  He states his legs are still swollen but a lot better than previous.  Little less short of breath.  He stated he had a lot of symptoms of cough and unable to sleep and unable to lie flat prior to coming in.  Admitted with CHF exacerbation  Objective: Vitals:   12/26/20 1145 12/26/20 1451  BP: 114/87 92/65  Pulse: 88 63  Resp: 20 20  Temp: 97.7 F (36.5 C) 98 F (36.7 C)  SpO2: 100% 98%    Intake/Output Summary (Last 24 hours) at 12/26/2020 1811 Last data filed at 12/26/2020 1745 Gross per 24 hour  Intake 1198 ml  Output 2725 ml  Net -1527 ml   Filed Weights   12/25/20 0734 12/26/20 0343  Weight: 113.4 kg 119.3 kg    ROS: Review of Systems  Respiratory: Positive for cough and shortness of breath.   Cardiovascular: Negative for chest pain.  Gastrointestinal: Negative for abdominal pain, nausea and vomiting.   Exam: Physical Exam HENT:     Head: Normocephalic.     Mouth/Throat:     Pharynx: No oropharyngeal exudate.  Eyes:     General: Lids are normal.     Conjunctiva/sclera: Conjunctivae normal.     Pupils: Pupils are equal, round, and reactive to light.  Cardiovascular:     Rate and Rhythm: Normal rate and regular rhythm.     Heart sounds: Normal heart sounds, S1 normal and S2 normal.  Pulmonary:     Breath sounds: Examination of the right-lower field reveals decreased breath sounds. Examination of the left-lower field reveals decreased breath sounds. Decreased breath sounds present. No wheezing, rhonchi or rales.  Abdominal:     Palpations: Abdomen is soft.     Tenderness: There is no abdominal tenderness.  Musculoskeletal:     Right ankle: Swelling present.     Left ankle: Swelling present.  Skin:     General: Skin is warm.     Findings: No rash.  Neurological:     Mental Status: He is alert and oriented to person, place, and time.       Data Reviewed: Basic Metabolic Panel: Recent Labs  Lab 12/25/20 0725 12/25/20 0936 12/26/20 0438  NA 134*  --  138  K 3.3*  --  3.2*  CL 98  --  102  CO2 27  --  26  GLUCOSE 150*  --  129*  BUN 39*  --  41*  CREATININE 2.21*  --  1.73*  CALCIUM 8.6*  --  8.8*  MG  --  2.1  --    Liver Function Tests: Recent Labs  Lab 12/25/20 0725  AST 20  ALT 22  ALKPHOS 102  BILITOT 1.1  PROT 7.2  ALBUMIN 3.4*   CBC: Recent Labs  Lab 12/25/20 0725  WBC 6.6  NEUTROABS 4.8  HGB 10.4*  HCT 32.0*  MCV 78.0*  PLT 271   BNP (last 3 results) Recent Labs    11/20/20 0227 12/12/20 2302 12/25/20 0725  BNP 2,321.1* 3,111.3* 2,743.6*      Recent Results (from the past 240 hour(s))  Resp Panel by RT-PCR (Flu A&B, Covid) Nasopharyngeal Swab     Status: None   Collection Time: 12/25/20  7:25 AM  Specimen: Nasopharyngeal Swab; Nasopharyngeal(NP) swabs in vial transport medium  Result Value Ref Range Status   SARS Coronavirus 2 by RT PCR NEGATIVE NEGATIVE Final    Comment: (NOTE) SARS-CoV-2 target nucleic acids are NOT DETECTED.  The SARS-CoV-2 RNA is generally detectable in upper respiratory specimens during the acute phase of infection. The lowest concentration of SARS-CoV-2 viral copies this assay can detect is 138 copies/mL. A negative result does not preclude SARS-Cov-2 infection and should not be used as the sole basis for treatment or other patient management decisions. A negative result may occur with  improper specimen collection/handling, submission of specimen other than nasopharyngeal swab, presence of viral mutation(s) within the areas targeted by this assay, and inadequate number of viral copies(<138 copies/mL). A negative result must be combined with clinical observations, patient history, and  epidemiological information. The expected result is Negative.  Fact Sheet for Patients:  BloggerCourse.com  Fact Sheet for Healthcare Providers:  SeriousBroker.it  This test is no t yet approved or cleared by the [place] FDA and  has been authorized for detection and/or diagnosis of SARS-CoV-2 by FDA under an Emergency Use Authorization (EUA). This EUA will remain  in effect (meaning this test can be used) for the duration of the COVID-19 declaration under Section 564(b)(1) of the Act, 21 U.S.C.section 360bbb-3(b)(1), unless the authorization is terminated  or revoked sooner.       Influenza A by PCR NEGATIVE NEGATIVE Final   Influenza B by PCR NEGATIVE NEGATIVE Final    Comment: (NOTE) The Xpert Xpress SARS-CoV-2/FLU/RSV plus assay is intended as an aid in the diagnosis of influenza from Nasopharyngeal swab specimens and should not be used as a sole basis for treatment. Nasal washings and aspirates are unacceptable for Xpert Xpress SARS-CoV-2/FLU/RSV testing.  Fact Sheet for Patients: BloggerCourse.com  Fact Sheet for Healthcare Providers: SeriousBroker.it  This test is not yet approved or cleared by the [place] FDA and has been authorized for detection and/or diagnosis of SARS-CoV-2 by FDA under an Emergency Use Authorization (EUA). This EUA will remain in effect (meaning this test can be used) for the duration of the COVID-19 declaration under Section 564(b)(1) of the Act, 21 U.S.C. section 360bbb-3(b)(1), unless the authorization is terminated or revoked.  Performed at [facility], [address] [place], [place] [address]      Studies: DG Chest Portable 1 View  Result Date: 12/25/2020 CLINICAL DATA:  Shortness of breath. EXAM: PORTABLE CHEST 1 VIEW COMPARISON:  December 12, 2020. FINDINGS: Similar enlarged cardiac silhouette. Mild pulmonary  vascular congestion. The lungs are clear. No visible pleural effusions or pneumothorax on this single semi-erect radiograph. No acute osseous abnormality. IMPRESSION: Cardiomegaly with mild pulmonary vascular congestion. Electronically Signed   By: Feliberto Harts MD   On: 12/25/2020 07:52    Scheduled Meds: . aspirin EC  81 mg Oral Daily  . atorvastatin  40 mg Oral Daily  . carvedilol  6.25 mg Oral BID WC  . enoxaparin (LOVENOX) injection  0.5 mg/kg Subcutaneous Q24H  . furosemide  40 mg Intravenous BID  . gabapentin  300 mg Oral QHS  . isosorbide-hydrALAZINE  1 tablet Oral TID  . pantoprazole  40 mg Oral Daily  . potassium chloride  40 mEq Oral BID  . sodium chloride flush  3 mL Intravenous Q12H   Continuous Infusions: . sodium chloride     Brief history.  Patient with a history of acute on chronic systolic congestive heart failure coming in with lower  extremity edema shortness of breath and orthopnea.  Admitted 12/25/2020. Started on IV Lasix.  History of hypertension.  Cocaine in urine toxicology. Assessment/Plan:  1. Acute on chronic systolic congestive heart failure.  Last ejection fraction less than 20%.  Patient on Coreg and IV Lasix.  Holding ARB with acute kidney injury. 2. Acute kidney injury on chronic kidney disease stage IIIa.  Creatinine 2.21 on presentation down to 1.73 with diuresis.  Continue to watch closely with diuresis. 3. Hypokalemia replace potassium 4. Hyperlipidemia unspecified on atorvastatin 5. Cocaine in the urine toxicology 6. Obesity with a BMI of 36.67        Code Status:     Code Status Orders  (From admission, onward)         Start     Ordered   12/25/20 0943  Full code  Continuous        12/25/20 0944        Code Status History    Date Active Date Inactive Code Status Order ID Comments User Context   12/13/2020 0221 12/17/2020 1812 Full Code [id number]  Mansy, Vernetta Honey, MD ED   10/15/2020 1208 10/21/2020 1841 Full Code [id number]  Teddy Spike, DO ED   09/08/2020 1651 09/11/2020 2119 Full Code [id number]  Teddy Spike, DO Inpatient   05/07/2019 0607 05/11/2019 1733 Full Code [id number]  Eduard Clos, MD ED   11/08/2018 0032 11/12/2018 1740 Full Code [id number]  Ike Bene, MD ED   09/27/2018 0826 09/29/2018 1036 Full Code [id number]  Darlin Drop, DO ED   Advance Care Planning Activity     Disposition Plan: Status is: Inpatient  Dispo: The patient is from: Home              Anticipated d/c is to: Home              Anticipated d/c date is: Another day or so.              Patient currently being diuresed with IV Lasix   Difficult to place patient.  No.  Consultants:  Cardiology  Time spent: 28 minutes  [employer]

## 2020-12-27 ENCOUNTER — Telehealth (HOSPITAL_COMMUNITY): Payer: Self-pay | Admitting: Licensed Clinical Social Worker

## 2020-12-27 DIAGNOSIS — F1910 Other psychoactive substance abuse, uncomplicated: Secondary | ICD-10-CM

## 2020-12-27 DIAGNOSIS — N179 Acute kidney failure, unspecified: Secondary | ICD-10-CM | POA: Diagnosis not present

## 2020-12-27 DIAGNOSIS — I10 Essential (primary) hypertension: Secondary | ICD-10-CM | POA: Diagnosis not present

## 2020-12-27 DIAGNOSIS — I5023 Acute on chronic systolic (congestive) heart failure: Secondary | ICD-10-CM | POA: Diagnosis not present

## 2020-12-27 DIAGNOSIS — E669 Obesity, unspecified: Secondary | ICD-10-CM | POA: Diagnosis not present

## 2020-12-27 LAB — BASIC METABOLIC PANEL
Anion gap: 8 (ref 5–15)
BUN: 41 mg/dL — ABNORMAL HIGH (ref 6–20)
CO2: 26 mmol/L (ref 22–32)
Calcium: 8.8 mg/dL — ABNORMAL LOW (ref 8.9–10.3)
Chloride: 104 mmol/L (ref 98–111)
Creatinine, Ser: 1.48 mg/dL — ABNORMAL HIGH (ref 0.61–1.24)
GFR, Estimated: 55 mL/min — ABNORMAL LOW (ref >60)
Glucose, Bld: 115 mg/dL — ABNORMAL HIGH (ref 70–99)
Potassium: 4.1 mmol/L (ref 3.5–5.1)
Sodium: 138 mmol/L (ref 135–145)

## 2020-12-27 LAB — MAGNESIUM: Magnesium: 2.3 mg/dL (ref 1.7–2.4)

## 2020-12-27 MED ORDER — FUROSEMIDE 10 MG/ML IJ SOLN
60.0000 mg | Freq: Two times a day (BID) | INTRAMUSCULAR | Status: DC
  Administered 2020-12-27 – 2020-12-29 (×4): 60 mg via INTRAVENOUS
  Filled 2020-12-27 (×4): qty 6

## 2020-12-27 MED ORDER — FUROSEMIDE 10 MG/ML IJ SOLN
20.0000 mg | Freq: Once | INTRAMUSCULAR | Status: AC
  Administered 2020-12-27: 20 mg via INTRAVENOUS
  Filled 2020-12-27 (×2): qty 2

## 2020-12-27 NOTE — Telephone Encounter (Signed)
CSW received a call form patient stating that he contacted [facility] and was told that he will now not be accepted due to his Heart failure status. Patient is currently hospitalized for fluid overload and also positive for cocaine. Patient states he last used a week ago and states that he "realize that I need to change my ways and my living". CSW provided supportive intervention and encouraged patient to follow HF guidelines for fluid intake and low sodium diet. CSW also encouraged patient to attend NA meetings and explore a sponsor as inpatient treatment options are very limited due to his CHF. Patient verbalizes understanding and will continue to communicate with CSW post hospitalization. CSW available as needed. [name], LCSW, CCSW-MCS [phone]

## 2020-12-27 NOTE — Progress Notes (Signed)
   Heart Failure Nurse Navigator Note  HfrEF less than 20%, moderately decreased right ventricular systolic function.  Mild mitral regurgitation by echocardiogram performed on December 14, 2020.  He presented with increasing shortness of breath for the last several days along with lower extremity edema.  Chest x-ray revealed pulmonary congestion that was mild.  He had last been discharged from the hospital on February 13, was felt to be still in volume overload but patient insisted on being discharged.   Comorbidities:  Hypertension Hyperlipidemia Chronic kidney disease Anemia Morbid obesity Coronary artery disease   He has a known history of substance abuse with cocaine, urinalysis on admission was positive for cocaine and cannabis.  Medications:  Aspirin 81 mg daily Atorvastatin 40 mg daily Coreg 6.25 mg twice daily Furosemide 40 mg IV twice daily Isosorbide/hydralazine 20/37.51 tablet 3 times a day Potassium chloride 20 mEq daily  Losartan remains on hold and not on starting MRA at this time due to kidney function.  May start SGLT2 inhibitor and Entresto as an outpatient per cardiology's notes.   Labs:  Sodium 138, potassium 4.1, chloride 104, CO2 26, BUN 41, creatinine 1.48 down from 1.73 of yesterday, magnesium 2.3 Intake 1078 mL Output 2700 mL Weights appear inaccurate as yesterday's weight was 119.3 kg and today's weight is 121.4 kg despite putting out 2700 mL of urine. Blood pressure 113/80 BMI 37.33    Assessment:  General he is awake and alert lying in bed having just returned from the bathroom and urinating.  HEENT-pupils are equal, non icteric, no JVD noted.  Cardiac-heart tones of regular rate and rhythm.  Chest-lungs are clear to posterior auscultation.  Abdomen-soft nontender positive for bowel sounds, does not appear as distended as previously noted.  Musculoskeletal-knee-high TED hose remain on.  Feet are still edematous but lower legs did not  appear as trunk like.  Psych-is pleasant and appropriate makes good eye contact  Neurologic-speech is clear, moves all extremities without difficulty.   Met with patient again today, by teach back method discussed daily weights, what to report to physician, he appears to have a good understanding at this time.  Also discussed with him today the importance of keeping the fluid off, and explained to him how fluid overload affects  his kidneys.  Patient voices frustration over not be accepted by [facility] for drug detoxing.  He states that he is very determined to kick this habit feels if he has to do it by himself he will get out of the current living situation that he is in and get this done.  Informed by social worker that patient has been accepted to the [facility] in [place], they are willing to accept him tomorrow.   We will continue to follow.    RN, CHFN  . 

## 2020-12-27 NOTE — Progress Notes (Signed)
PROGRESS NOTE    Dale [name]  PYP:[id number] DOB: 1960-11-26 DOA: 12/25/2020 PCP: Vesta Mixer, MD   Chief complaint.  Shortness of breath. Brief Narrative:  Patient with a history of acute on chronic systolic congestive heart failure coming in with lower extremity edema shortness of breath and orthopnea.  Admitted 12/25/2020. Started on IV Lasix.  History of hypertension.  Cocaine in urine toxicology. Echocardiogram showed ejection fraction less than 20%.   Assessment & Plan:   Principal Problem:   Acute on chronic systolic CHF (congestive heart failure) (HCC) Active Problems:   Essential hypertension   Obesity (BMI 30-39.9)   Substance abuse (HCC)   Acute kidney injury superimposed on CKD (HCC)   Hypokalemia  #1. Acute on chronic systolic congestive heart failure. Patient volume status much better.  Discussed with cardiology, due to recent admission to the hospital, will like to keep him longer.  Continue IV Lasix.  #2 pure acute kidney injury on chronic kidney disease stage IIIa.  Renal function is better, continue monitor closely.  3.  Hypokalemia. Potassium level normalized  4.  Cocaine abuse. Patient admitted to using cocaine, advised to quit.  #5.  Morbid obesity. Patient has BMI of 36.67, with essential hypertension and congestive heart failure.     DVT prophylaxis: Lovenox Code Status: Full Family Communication:  Disposition Plan:  .   Status is: Inpatient  Remains inpatient appropriate because:Inpatient level of care appropriate due to severity of illness   Dispo: The patient is from: Home              Anticipated d/c is to: Home              Anticipated d/c date is: 1 day              Patient currently is not medically stable to d/c.   Difficult to place patient No        I/O last 3 completed shifts: In: 1198 [P.O.:1198] Out: 3700 [Urine:3700] Total I/O In: 360 [P.O.:360] Out: -      Consultants:   cardiology  Procedures:  None  Antimicrobials:None  Subjective: Patient feels better today, short of breath improving.  No paroxysmal nocturnal dyspnea.  Still has leg edema. Patient has difficulty with compliance with fluid restriction. Denies any abdominal pain or nausea vomiting No fever chills.  Objective: Vitals:   12/26/20 1926 12/26/20 2034 12/27/20 0324 12/27/20 0806  BP: 111/79 118/71 (!) 113/91 113/80  Pulse: 96 87 87 89  Resp: 18  20 20   Temp: 98.4 F (36.9 C)  97.9 F (36.6 C) (!) 97.5 F (36.4 C)  TempSrc: Oral  Oral Oral  SpO2: 98% 99% 100% 99%  Weight:   121.4 kg   Height:        Intake/Output Summary (Last 24 hours) at 12/27/2020 0953 Last data filed at 12/27/2020 0847 Gross per 24 hour  Intake 838 ml  Output 2425 ml  Net -1587 ml   Filed Weights   12/25/20 0734 12/26/20 0343 12/27/20 0324  Weight: 113.4 kg 119.3 kg 121.4 kg    Examination:  General exam: Appears calm and comfortable, morbid obese Respiratory system: Clear to auscultation. Respiratory effort normal. Cardiovascular system: S1 & S2 heard, RRR. No JVD, murmurs, rubs, gallops or clicks.  Gastrointestinal system: Abdomen is nondistended, soft and nontender. No organomegaly or masses felt. Normal bowel sounds heard. Central nervous system: Alert and oriented. No focal neurological deficits. Extremities: 1+ leg edema Skin: No rashes, lesions or  ulcers Psychiatry: Judgement and insight appear normal. Mood & affect appropriate.     Data Reviewed: I have personally reviewed following labs and imaging studies  CBC: Recent Labs  Lab 12/25/20 0725  WBC 6.6  NEUTROABS 4.8  HGB 10.4*  HCT 32.0*  MCV 78.0*  PLT 271   Basic Metabolic Panel: Recent Labs  Lab 12/25/20 0725 12/25/20 0936 12/26/20 0438 12/27/20 0412  NA 134*  --  138 138  K 3.3*  --  3.2* 4.1  CL 98  --  102 104  CO2 27  --  26 26  GLUCOSE 150*  --  129* 115*  BUN 39*  --  41* 41*  CREATININE 2.21*  --  1.73* 1.48*  CALCIUM 8.6*  --   8.8* 8.8*  MG  --  2.1  --  2.3   GFR: Estimated Creatinine Clearance: 73 mL/min (A) (by C-G formula based on SCr of 1.48 mg/dL (H)). Liver Function Tests: Recent Labs  Lab 12/25/20 0725  AST 20  ALT 22  ALKPHOS 102  BILITOT 1.1  PROT 7.2  ALBUMIN 3.4*   No results for input(s): LIPASE, AMYLASE in the last 168 hours. No results for input(s): AMMONIA in the last 168 hours. Coagulation Profile: No results for input(s): INR, PROTIME in the last 168 hours. Cardiac Enzymes: No results for input(s): CKTOTAL, CKMB, CKMBINDEX, TROPONINI in the last 168 hours. BNP (last 3 results) No results for input(s): PROBNP in the last 8760 hours. HbA1C: No results for input(s): HGBA1C in the last 72 hours. CBG: No results for input(s): GLUCAP in the last 168 hours. Lipid Profile: No results for input(s): CHOL, HDL, LDLCALC, TRIG, CHOLHDL, LDLDIRECT in the last 72 hours. Thyroid Function Tests: No results for input(s): TSH, T4TOTAL, FREET4, T3FREE, THYROIDAB in the last 72 hours. Anemia Panel: No results for input(s): VITAMINB12, FOLATE, FERRITIN, TIBC, IRON, RETICCTPCT in the last 72 hours. Sepsis Labs: No results for input(s): PROCALCITON, LATICACIDVEN in the last 168 hours.  Recent Results (from the past 240 hour(s))  Resp Panel by RT-PCR (Flu A&B, Covid) Nasopharyngeal Swab     Status: None   Collection Time: 12/25/20  7:25 AM   Specimen: Nasopharyngeal Swab; Nasopharyngeal(NP) swabs in vial transport medium  Result Value Ref Range Status   SARS Coronavirus 2 by RT PCR NEGATIVE NEGATIVE Final    Comment: (NOTE) SARS-CoV-2 target nucleic acids are NOT DETECTED.  The SARS-CoV-2 RNA is generally detectable in upper respiratory specimens during the acute phase of infection. The lowest concentration of SARS-CoV-2 viral copies this assay can detect is 138 copies/mL. A negative result does not preclude SARS-Cov-2 infection and should not be used as the sole basis for treatment or other  patient management decisions. A negative result may occur with  improper specimen collection/handling, submission of specimen other than nasopharyngeal swab, presence of viral mutation(s) within the areas targeted by this assay, and inadequate number of viral copies(<138 copies/mL). A negative result must be combined with clinical observations, patient history, and epidemiological information. The expected result is Negative.  Fact Sheet for Patients:  BloggerCourse.com  Fact Sheet for Healthcare Providers:  SeriousBroker.it  This test is no t yet approved or cleared by the [place] FDA and  has been authorized for detection and/or diagnosis of SARS-CoV-2 by FDA under an Emergency Use Authorization (EUA). This EUA will remain  in effect (meaning this test can be used) for the duration of the COVID-19 declaration under Section 564(b)(1) of the Act, 21 U.S.C.section  360bbb-3(b)(1), unless the authorization is terminated  or revoked sooner.       Influenza A by PCR NEGATIVE NEGATIVE Final   Influenza B by PCR NEGATIVE NEGATIVE Final    Comment: (NOTE) The Xpert Xpress SARS-CoV-2/FLU/RSV plus assay is intended as an aid in the diagnosis of influenza from Nasopharyngeal swab specimens and should not be used as a sole basis for treatment. Nasal washings and aspirates are unacceptable for Xpert Xpress SARS-CoV-2/FLU/RSV testing.  Fact Sheet for Patients: BloggerCourse.com  Fact Sheet for Healthcare Providers: SeriousBroker.it  This test is not yet approved or cleared by the [place] FDA and has been authorized for detection and/or diagnosis of SARS-CoV-2 by FDA under an Emergency Use Authorization (EUA). This EUA will remain in effect (meaning this test can be used) for the duration of the COVID-19 declaration under Section 564(b)(1) of the Act, 21 U.S.C. section  360bbb-3(b)(1), unless the authorization is terminated or revoked.  Performed at [facility], [address], [place], [place] [address]          Radiology Studies: No results found.      Scheduled Meds: . aspirin EC  81 mg Oral Daily  . atorvastatin  40 mg Oral Daily  . carvedilol  6.25 mg Oral BID WC  . enoxaparin (LOVENOX) injection  0.5 mg/kg Subcutaneous Q24H  . furosemide  40 mg Intravenous BID  . gabapentin  300 mg Oral QHS  . isosorbide-hydrALAZINE  1 tablet Oral TID  . pantoprazole  40 mg Oral Daily  . potassium chloride  40 mEq Oral BID  . sodium chloride flush  3 mL Intravenous Q12H   Continuous Infusions: . sodium chloride       LOS: 2 days    Time spent: 28 minutes    Marrion Coy, MD [facility]   To contact the attending provider between 7A-7P or the covering provider during after hours 7P-7A, please log into the web site [email or web address] and access using universal [place] password for that web site. If you do not have the password, please call the hospital operator.  12/27/2020, 9:53 AM

## 2020-12-27 NOTE — TOC Progression Note (Addendum)
Transition of Care [facility]) - Progression Note    Patient Details  Name: Dale [name] MRN: [id number] Date of Birth: Mar 04, 1961  Transition of Care [facility]) CM/SW Contact  Maree Krabbe, LCSW Phone Number: 12/27/2020, 8:34 AM  Clinical Narrative:   CSW spoke with [facility] and they are unable to take pt due to Congestive Heart Failure.   [facility] will not take pt due to CHF.  [facility] does not bill Medicaid.    [facility] is full and has not treatment avalibility.  [facility] in [place] has accepted pt for residential treatment. They will accept pt tomorrow if medically stable. Application has been completed. Physicians statement has been completed. CSW will fax along with [facility] to [facility] at ([phone]. Awaiting d/c determination.  Pt still requiring dieuretics. CSW spoke with Amy at [facility] and she states pt can come Friday. Cardiology notified.    Expected Discharge Plan and Services                                                 Social Determinants of Health (SDOH) Interventions    Readmission Risk Interventions Readmission Risk Prevention Plan 12/15/2020 09/11/2020 03/03/2019  Transportation Screening Complete Complete Complete  PCP or Specialist Appt within 3-5 Days - Complete -  HRI or Home Care Consult - Complete -  Social Work Consult for Recovery Care Planning/Counseling - Complete -  Palliative Care Screening - Complete -  Medication Review [facility]) Complete Complete Complete  PCP or Specialist appointment within 3-5 days of discharge Complete - Complete  HRI or Home Care Consult Complete - (No Data)  SW Recovery Care/Counseling Consult Complete - Complete  Palliative Care Screening Not Applicable - Not Applicable  Skilled Nursing Facility Not Applicable - Not Applicable  Some recent data might be hidden

## 2020-12-27 NOTE — TOC Progression Note (Signed)
Transition of Care [facility]) - Progression Note    Patient Details  Name: Dale [name] MRN: [id number] Date of Birth: [birth year]  Transition of Care [facility]) CM/SW Contact  Maree Krabbe, LCSW Phone Number: 12/27/2020, 2:36 PM  Clinical Narrative:  Pt is aware to weigh himself daily and notifiy MD of any weigh gain. Pt has a established Cardiologst. Scale provided at bedside.  Pt gets his meds from [place] on [address]. Pt uses [facility] for his appointments. Pt was not getting any services prior to admission. CSW has got pt into a recovery program ([facility]) potential dc there Friday.        Expected Discharge Plan and Services           Expected Discharge Date: 12/27/20                                     Social Determinants of Health (SDOH) Interventions    Readmission Risk Interventions Readmission Risk Prevention Plan 12/15/2020 09/11/2020 03/03/2019  Transportation Screening Complete Complete Complete  PCP or Specialist Appt within 3-5 Days - Complete -  HRI or Home Care Consult - Complete -  Social Work Consult for Recovery Care Planning/Counseling - Complete -  Palliative Care Screening - Complete -  Medication Review Oceanographer) Complete Complete Complete  PCP or Specialist appointment within 3-5 days of discharge Complete - Complete  HRI or Home Care Consult Complete - (No Data)  SW Recovery Care/Counseling Consult Complete - Complete  Palliative Care Screening Not Applicable - Not Applicable  Skilled Nursing Facility Not Applicable - Not Applicable  Some recent data might be hidden

## 2020-12-27 NOTE — Progress Notes (Signed)
Progress Note  Patient Name: Dale [name] Date of Encounter: 12/27/2020  Primary Cardiologist: Nahser  Subjective   Dyspnea continues to improve.  No angina.  Able to sleep fully supine without issues.  Documented urine output 1.6 L for the past 24 hours with a net -3.1 L for the admission.  Suspect weights are inaccurate as his weight trend has been from 113 kg to 119 to 121.4 kg over the past 48 hours.  Renal function has improved with IV diuresis.  Urine drug screen positive for THC and cocaine.  Inpatient Medications    Scheduled Meds: . aspirin EC  81 mg Oral Daily  . atorvastatin  40 mg Oral Daily  . carvedilol  6.25 mg Oral BID WC  . enoxaparin (LOVENOX) injection  0.5 mg/kg Subcutaneous Q24H  . furosemide  40 mg Intravenous BID  . gabapentin  300 mg Oral QHS  . isosorbide-hydrALAZINE  1 tablet Oral TID  . pantoprazole  40 mg Oral Daily  . potassium chloride  40 mEq Oral BID  . sodium chloride flush  3 mL Intravenous Q12H   Continuous Infusions: . sodium chloride     PRN Meds: sodium chloride, acetaminophen, ondansetron (ZOFRAN) IV, sodium chloride flush   Vital Signs    Vitals:   12/26/20 1926 12/26/20 2034 12/27/20 0324 12/27/20 0806  BP: 111/79 118/71 (!) 113/91 113/80  Pulse: 96 87 87 89  Resp: 18  20 20   Temp: 98.4 F (36.9 C)  97.9 F (36.6 C) (!) 97.5 F (36.4 C)  TempSrc: Oral  Oral Oral  SpO2: 98% 99% 100% 99%  Weight:   121.4 kg   Height:        Intake/Output Summary (Last 24 hours) at 12/27/2020 0937 Last data filed at 12/27/2020 0847 Gross per 24 hour  Intake 1078 ml  Output 2425 ml  Net -1347 ml   Filed Weights   12/25/20 0734 12/26/20 0343 12/27/20 0324  Weight: 113.4 kg 119.3 kg 121.4 kg    Telemetry    Sinus rhythm with occasional PVCs, ventricular triplet, bigeminy, and NSVT lasting 12 beats - Personally Reviewed  ECG    No new tracings - Personally Reviewed  Physical Exam   GEN: No acute distress.   Neck: No  JVD. Cardiac: RRR, no murmurs, rubs, or gallops.  Respiratory:  Improving breath sounds that are faintly diminished at the bilateral bases.  GI: Soft, nontender, non-distended.   MS:  Mild bilateral lower extremity edema; No deformity. Neuro:  Alert and oriented x 3; Nonfocal.  Psych: Normal affect.  Labs    Chemistry Recent Labs  Lab 12/25/20 0725 12/26/20 0438 12/27/20 0412  NA 134* 138 138  K 3.3* 3.2* 4.1  CL 98 102 104  CO2 27 26 26   GLUCOSE 150* 129* 115*  BUN 39* 41* 41*  CREATININE 2.21* 1.73* 1.48*  CALCIUM 8.6* 8.8* 8.8*  PROT 7.2  --   --   ALBUMIN 3.4*  --   --   AST 20  --   --   ALT 22  --   --   ALKPHOS 102  --   --   BILITOT 1.1  --   --   GFRNONAA 34* 45* 55*  ANIONGAP 9 10 8      Hematology Recent Labs  Lab 12/25/20 0725  WBC 6.6  RBC 4.10*  HGB 10.4*  HCT 32.0*  MCV 78.0*  MCH 25.4*  MCHC 32.5  RDW 17.1*  PLT 271  Cardiac EnzymesNo results for input(s): TROPONINI in the last 168 hours. No results for input(s): TROPIPOC in the last 168 hours.   BNP Recent Labs  Lab 12/25/20 0725  BNP 2,743.6*     DDimer No results for input(s): DDIMER in the last 168 hours.   Radiology    DG Chest Portable 1 View  Result Date: 12/25/2020 IMPRESSION: Cardiomegaly with mild pulmonary vascular congestion. Electronically Signed   By: Feliberto Harts MD   On: 12/25/2020 07:52    Cardiac Studies   2D echo 12/14/2020: 1. Left ventricular ejection fraction, by estimation, is <20%. The left  ventricle has severely decreased function. The left ventricle demonstrates  global hypokinesis. The left ventricular internal cavity size was severely  dilated. Left ventricular  diastolic parameters are indeterminate.  2. Right ventricular systolic function is moderately reduced. The right  ventricular size is mildly enlarged. There is normal pulmonary artery  systolic pressure.  3. The mitral valve is normal in structure. Mild mitral valve   Regurgitation. __________  2D echo 09/2020: 1. Left ventricular ejection fraction, by estimation, is approximately  20%. The left ventricle has severely decreased function. The left  ventricle demonstrates global hypokinesis with regional variation as  noted. The left ventricular internal cavity  size was moderately dilated. Left ventricular diastolic parameters are  consistent with Grade III diastolic dysfunction (restrictive). No obvious  mural thrombus noted with Definity contrast.  2. Right ventricular systolic function is moderately to severely reduced.  The right ventricular size is normal. There is moderately elevated  pulmonary artery systolic pressure. The estimated right ventricular  systolic pressure is 52.0 mmHg.  3. Left atrial size was mild to moderately dilated.  4. Right atrial size was mild to moderately dilated.  5. The mitral valve is grossly normal. Mild mitral valve regurgitation.  6. The aortic valve is tricuspid. Aortic valve regurgitation is trivial.  7. The inferior vena cava is dilated in size with <50% respiratory  variability, suggesting right atrial pressure of 15 mmHg. __________  [facility] 05/2019: Conclusions: 1. Severe single-vessel coronary artery disease with chronic total occlusion of the proximal RCA. 2. Mild to moderate, non-obstructive coronary artery disease involving the LAD and LCx. 3. Moderately elevated left heart filling pressures. 4. Severely elevated right heart filling pressures. 5. Mild pulmonary hypertension. 6. Normal Fick cardiac output/index.  Recommendations: 1. 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 Patient will be transported back to [facility] after recovery from today's procedure. 2. Continue diuresis and optimization of evidence-based heart failure therapy. I will restart furosemide 40 mg IV BID this afternoon. 3. Secondary prevention of coronary artery disease, including  high-intensity statin therapy and avoidance of cocaine. __________  2D echo 03/2019: 1. The left ventricle has severely reduced systolic function, with an  ejection fraction of 20-25%. The cavity size was moderately dilated. There  is mildly increased left ventricular wall thickness. Left ventricular  diastolic Doppler parameters are  consistent with restrictive filling. Elevated left ventricular  end-diastolic pressure.  2. Diffuse hypokinesis worse in the inferior wall.  3. The right ventricle has normal systolic function. The cavity was  moderately enlarged. There is no increase in right ventricular wall  thickness.  4. Right atrial size was mildly dilated.  5. Mild thickening of the mitral valve leaflet.  6. The aortic valve is tricuspid. Mild thickening of the aortic valve.  Mild calcification of the aortic valve.  __________  2D echo 09/2018: - Left ventricle: The cavity size was mildly  dilated. Wall  thickness was increased in a pattern of mild LVH. Systolic  function was severely reduced. The estimated ejection fraction  was in the range of 20% to 25%. Diffuse hypokinesis. Features are  consistent with a pseudonormal left ventricular filling pattern,  with concomitant abnormal relaxation and increased filling  pressure (grade 2 diastolic dysfunction). Doppler parameters are  consistent with high ventricular filling pressure.  - Aortic valve: There was trivial regurgitation.  - Mitral valve: There was mild regurgitation.  - Left atrium: The atrium was severely dilated.  - Right ventricle: The cavity size was mildly dilated. Systolic  function was mildly reduced.  - Right atrium: The atrium was moderately dilated.  - Pericardium, extracardiac: A trivial pericardial effusion was  identified.   Impressions:   - Severe global reduction in LV systolic function; mild LVH and  LVE; moderate diastolic dysfunction; trace AI; mild MR; biatrial   enlargement; mild RVE with mild RV dysfunction.   Patient Profile     60 y.o. 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 Joylene Igo.  Assessment & Plan    1. 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: -He was recently admitted and discharged on 2/13 despite still being volume up as he was insistent on being discharged at that time -Torsemide was increased to 40 mg bid at discharge, though he was only taking this once daily -Continue IV Lasix 40 mg bid with KCl repletion, he will likely need a couple more days of inpatient diuresis -Limited echo earlier this month with an EF less than 20% largely consistent with prior study -Coreg and BiDil -Losartan on hold with AKI, resume as able -Not on MRA with underlying CKD -Look to add SGLT2iand transition to [facility] outpatient follow upas able -Would benefit from establishing with the advanced heart failure team in [place] as an outpatient -CHF education -Daily weights -Strict I/O  2. CAD involving the native coronary arteries with elevated troponin: -Minimal flat trending HS-Tn, not consistent with ACS -No indication for heparin gtt -Recent echo as above -ASA, Coreg, Lipitor  -Recommend outpatient ischemic evaluation once his respiratory status and renal function have improved   3. Acute on CKD stage II with likely cardiorenal syndrome: -Improving with diuresis -Continue to monitor -If renal function worsens with diuresis and he is still felt to be volume overloaded may need to consider Lasix drip versus milrinone infusion, however currently this is not needed  4. HTN: -Blood pressure  currently well controlled -Continue medications as outlined above  5. HLD: -LDL 111 in 09/2020 with goal LDL < 70 -Continue Lipitor -Outpatient follow up  6. Anemia: -Likely of chronic disease  -Stable at time of admission  7. Polysubstance abuse: -Urine drug screen positive for THC and cocaine this admission with patient indicating he last used cocaine approximately 3 weeks ago -Contributing to his overall morbidity -Complete cessation is advised   8. PVCs/NSVT: -Asymptomatic  -Coreg as above -Potassium repleted -Magnesium at goal -Possibly in the context of his cardiomyopathy, though could also be contributing to his cardiomyopathy   9.  Second-degree AV block type I: -Noted during the overnight hours, likely in sleeping -Asymptomatic -Recommend outpatient sleep study  For questions or updates, please contact [facility] Please consult [email or web address] for contact info under Cardiology/STEMI.    Signed, Eula Listen, PA-C [facility] Pager: [phone] 12/27/2020, 9:37 AM

## 2020-12-28 DIAGNOSIS — N189 Chronic kidney disease, unspecified: Secondary | ICD-10-CM | POA: Diagnosis not present

## 2020-12-28 DIAGNOSIS — I509 Heart failure, unspecified: Secondary | ICD-10-CM | POA: Diagnosis not present

## 2020-12-28 DIAGNOSIS — I10 Essential (primary) hypertension: Secondary | ICD-10-CM | POA: Diagnosis not present

## 2020-12-28 DIAGNOSIS — I1310 Hypertensive heart and chronic kidney disease without heart failure, with stage 1 through stage 4 chronic kidney disease, or unspecified chronic kidney disease: Secondary | ICD-10-CM

## 2020-12-28 DIAGNOSIS — N179 Acute kidney failure, unspecified: Secondary | ICD-10-CM

## 2020-12-28 DIAGNOSIS — I5023 Acute on chronic systolic (congestive) heart failure: Secondary | ICD-10-CM | POA: Diagnosis not present

## 2020-12-28 DIAGNOSIS — E669 Obesity, unspecified: Secondary | ICD-10-CM

## 2020-12-28 LAB — CBC WITH DIFFERENTIAL/PLATELET
Abs Immature Granulocytes: 0.02 10*3/uL (ref 0.00–0.07)
Basophils Absolute: 0 10*3/uL (ref 0.0–0.1)
Basophils Relative: 1 %
Eosinophils Absolute: 0.2 10*3/uL (ref 0.0–0.5)
Eosinophils Relative: 3 %
HCT: 32.1 % — ABNORMAL LOW (ref 39.0–52.0)
Hemoglobin: 10.2 g/dL — ABNORMAL LOW (ref 13.0–17.0)
Immature Granulocytes: 0 %
Lymphocytes Relative: 16 %
Lymphs Abs: 1 10*3/uL (ref 0.7–4.0)
MCH: 25.2 pg — ABNORMAL LOW (ref 26.0–34.0)
MCHC: 31.8 g/dL (ref 30.0–36.0)
MCV: 79.5 fL — ABNORMAL LOW (ref 80.0–100.0)
Monocytes Absolute: 0.7 10*3/uL (ref 0.1–1.0)
Monocytes Relative: 12 %
Neutro Abs: 4.2 10*3/uL (ref 1.7–7.7)
Neutrophils Relative %: 68 %
Platelets: 303 10*3/uL (ref 150–400)
RBC: 4.04 MIL/uL — ABNORMAL LOW (ref 4.22–5.81)
RDW: 17.6 % — ABNORMAL HIGH (ref 11.5–15.5)
WBC: 6.1 10*3/uL (ref 4.0–10.5)
nRBC: 0 % (ref 0.0–0.2)

## 2020-12-28 LAB — BASIC METABOLIC PANEL
Anion gap: 8 (ref 5–15)
BUN: 42 mg/dL — ABNORMAL HIGH (ref 6–20)
CO2: 24 mmol/L (ref 22–32)
Calcium: 8.8 mg/dL — ABNORMAL LOW (ref 8.9–10.3)
Chloride: 105 mmol/L (ref 98–111)
Creatinine, Ser: 1.48 mg/dL — ABNORMAL HIGH (ref 0.61–1.24)
GFR, Estimated: 55 mL/min — ABNORMAL LOW (ref >60)
Glucose, Bld: 100 mg/dL — ABNORMAL HIGH (ref 70–99)
Potassium: 4.1 mmol/L (ref 3.5–5.1)
Sodium: 137 mmol/L (ref 135–145)

## 2020-12-28 LAB — MAGNESIUM: Magnesium: 2.2 mg/dL (ref 1.7–2.4)

## 2020-12-28 MED ORDER — TRAZODONE HCL 50 MG PO TABS
50.0000 mg | ORAL_TABLET | Freq: Every evening | ORAL | Status: DC | PRN
  Administered 2020-12-29 – 2020-12-31 (×4): 50 mg via ORAL
  Filled 2020-12-28 (×4): qty 1

## 2020-12-28 MED ORDER — MELATONIN 5 MG PO TABS
5.0000 mg | ORAL_TABLET | Freq: Every day | ORAL | Status: DC
  Administered 2020-12-28 – 2020-12-31 (×5): 5 mg via ORAL
  Filled 2020-12-28 (×5): qty 1

## 2020-12-28 NOTE — Progress Notes (Signed)
Progress Note  Patient Name: Dale [name] Date of Encounter: 12/28/2020  [facility] Cardiologist: Kristeen Miss, MD   Subjective   Good UOP overnight. Breathing is much better. No chest pain. Kidney function stable  Inpatient Medications    Scheduled Meds: . aspirin EC  81 mg Oral Daily  . atorvastatin  40 mg Oral Daily  . carvedilol  6.25 mg Oral BID WC  . enoxaparin (LOVENOX) injection  0.5 mg/kg Subcutaneous Q24H  . furosemide  60 mg Intravenous BID  . gabapentin  300 mg Oral QHS  . isosorbide-hydrALAZINE  1 tablet Oral TID  . melatonin  5 mg Oral QHS  . pantoprazole  40 mg Oral Daily  . potassium chloride  40 mEq Oral BID  . sodium chloride flush  3 mL Intravenous Q12H   Continuous Infusions: . sodium chloride     PRN Meds: sodium chloride, acetaminophen, ondansetron (ZOFRAN) IV, sodium chloride flush   Vital Signs    Vitals:   12/27/20 2015 12/28/20 0511 12/28/20 0616 12/28/20 0740  BP: 111/76 116/84  103/77  Pulse: 91 82  78  Resp: 20 20    Temp: 98.6 F (37 C) 97.6 F (36.4 C)  (!) 97.5 F (36.4 C)  TempSrc:  Oral  Oral  SpO2: 100% 100%  96%  Weight:   122.8 kg   Height:        Intake/Output Summary (Last 24 hours) at 12/28/2020 0841 Last data filed at 12/28/2020 0753 Gross per 24 hour  Intake 1860 ml  Output 3950 ml  Net -2090 ml   Last 3 Weights 12/28/2020 12/27/2020 12/26/2020  Weight (lbs) 270 lb 11.6 oz 267 lb 10.2 oz 262 lb 14.4 oz  Weight (kg) 122.8 kg 121.4 kg 119.251 kg      Telemetry    NSR, PVCs, NSVT longest 6 beats, Vent bigeminy, HR 80s - Personally Reviewed  ECG    No new - Personally Reviewed  Physical Exam   GEN: No acute distress.   Neck: No JVD Cardiac: RRR, no murmurs, rubs, or gallops.  Respiratory: Clear to auscultation bilaterally. GI: Soft, nontender, non-distended  MS: Mild to moderate lower leg edema; No deformity. Neuro:  Nonfocal  Psych: Normal affect   Labs    High Sensitivity Troponin:   Recent  Labs  Lab 12/12/20 2302 12/13/20 0013 12/25/20 0725 12/25/20 0936  TROPONINIHS 30* 29* 30* 29*      Chemistry Recent Labs  Lab 12/25/20 0725 12/26/20 0438 12/27/20 0412 12/28/20 0428  NA 134* 138 138 137  K 3.3* 3.2* 4.1 4.1  CL 98 102 104 105  CO2 27 26 26 24   GLUCOSE 150* 129* 115* 100*  BUN 39* 41* 41* 42*  CREATININE 2.21* 1.73* 1.48* 1.48*  CALCIUM 8.6* 8.8* 8.8* 8.8*  PROT 7.2  --   --   --   ALBUMIN 3.4*  --   --   --   AST 20  --   --   --   ALT 22  --   --   --   ALKPHOS 102  --   --   --   BILITOT 1.1  --   --   --   GFRNONAA 34* 45* 55* 55*  ANIONGAP 9 10 8 8      Hematology Recent Labs  Lab 12/25/20 0725 12/28/20 0428  WBC 6.6 6.1  RBC 4.10* 4.04*  HGB 10.4* 10.2*  HCT 32.0* 32.1*  MCV 78.0* 79.5*  MCH 25.4* 25.2*  MCHC 32.5 31.8  RDW 17.1* 17.6*  PLT 271 303    BNP Recent Labs  Lab 12/25/20 0725  BNP 2,743.6*     DDimer No results for input(s): DDIMER in the last 168 hours.   Radiology    No results found.  Cardiac Studies    2D echo 12/14/2020: 1. Left ventricular ejection fraction, by estimation, is <20%. The left  ventricle has severely decreased function. The left ventricle demonstrates  global hypokinesis. The left ventricular internal cavity size was severely  dilated. Left ventricular  diastolic parameters are indeterminate.  2. Right ventricular systolic function is moderately reduced. The right  ventricular size is mildly enlarged. There is normal pulmonary artery  systolic pressure.  3. The mitral valve is normal in structure. Mild mitral valve  Regurgitation. __________  2D echo 09/2020: 1. Left ventricular ejection fraction, by estimation, is approximately  20%. The left ventricle has severely decreased function. The left  ventricle demonstrates global hypokinesis with regional variation as  noted. The left ventricular internal cavity  size was moderately dilated. Left ventricular diastolic parameters are   consistent with Grade III diastolic dysfunction (restrictive). No obvious  mural thrombus noted with Definity contrast.  2. Right ventricular systolic function is moderately to severely reduced.  The right ventricular size is normal. There is moderately elevated  pulmonary artery systolic pressure. The estimated right ventricular  systolic pressure is 52.0 mmHg.  3. Left atrial size was mild to moderately dilated.  4. Right atrial size was mild to moderately dilated.  5. The mitral valve is grossly normal. Mild mitral valve regurgitation.  6. The aortic valve is tricuspid. Aortic valve regurgitation is trivial.  7. The inferior vena cava is dilated in size with <50% respiratory  variability, suggesting right atrial pressure of 15 mmHg. __________  [facility] 05/2019: Conclusions: 1. Severe single-vessel coronary artery disease with chronic total occlusion of the proximal RCA. 2. Mild to moderate, non-obstructive coronary artery disease involving the LAD and LCx. 3. Moderately elevated left heart filling pressures. 4. Severely elevated right heart filling pressures. 5. Mild pulmonary hypertension. 6. Normal Fick cardiac output/index.  Recommendations: 1. 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 Patient will be transported back to [facility] after recovery from today's procedure. 2. Continue diuresis and optimization of evidence-based heart failure therapy. I will restart furosemide 40 mg IV BID this afternoon. 3. Secondary prevention of coronary artery disease, including high-intensity statin therapy and avoidance of cocaine. __________  2D echo 03/2019: 1. The left ventricle has severely reduced systolic function, with an  ejection fraction of 20-25%. The cavity size was moderately dilated. There  is mildly increased left ventricular wall thickness. Left ventricular  diastolic Doppler parameters are  consistent with restrictive filling.  Elevated left ventricular  end-diastolic pressure.  2. Diffuse hypokinesis worse in the inferior wall.  3. The right ventricle has normal systolic function. The cavity was  moderately enlarged. There is no increase in right ventricular wall  thickness.  4. Right atrial size was mildly dilated.  5. Mild thickening of the mitral valve leaflet.  6. The aortic valve is tricuspid. Mild thickening of the aortic valve.  Mild calcification of the aortic valve.  __________  2D echo 09/2018: - Left ventricle: The cavity size was mildly dilated. Wall  thickness was increased in a pattern of mild LVH. Systolic  function was severely reduced. The estimated ejection fraction  was in the range of 20% to 25%. Diffuse hypokinesis. Features are  consistent  with a pseudonormal left ventricular filling pattern,  with concomitant abnormal relaxation and increased filling  pressure (grade 2 diastolic dysfunction). Doppler parameters are  consistent with high ventricular filling pressure.  - Aortic valve: There was trivial regurgitation.  - Mitral valve: There was mild regurgitation.  - Left atrium: The atrium was severely dilated.  - Right ventricle: The cavity size was mildly dilated. Systolic  function was mildly reduced.  - Right atrium: The atrium was moderately dilated.  - Pericardium, extracardiac: A trivial pericardial effusion was  identified.   Impressions:   - Severe global reduction in LV systolic function; mild LVH and  LVE; moderate diastolic dysfunction; trace AI; mild MR; biatrial  enlargement; mild RVE with mild RV dysfunction.  Patient Profile     60 y.o. 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   Assessment & Plan    Acute on chronic combined  systolic and diastolic CHF Likely mixed ICM and NICM - Recent admission for acute CHF and Torsemide was increased to 40mg  BID at discharge although he was only taking this once daily - IV lasix 40mg  BID - Limited echo earlier this month showed EF<20%, consistent with prior study - UOP -3.7L and weight does not appear accurate - Coreg and BiDil -  Losartan on hold with AKI. Resume as able and plan to transition to [facility] - Consider addition of spiro and SGLT2i as outpatient. BP intermittently soft - continue with diuresis  CAD Elevated troponin - Troponin minimally elevated and flat, not consistent with ACS - No IV heparin - No reported chest pain - continue aspirin, coreg and statin - Plan for ischemic work-up as outpatient  AKI/CKD stage 2  - creatinine improving with diuresis, stable today  HTN - Pressures soft this morning - continue current medications  HLD - LDL 111, goal<70 - continue Lipitor  Anemia - stable  Polysubstance abuse - Urine drug screen positive for THC and cocaine. Reports last use 3 weeks ago - cessation discussed  Second degree AV block - overnight - recommend outpatient sleep study   For questions or updates, please contact [facility] Please consult [email or web address] for contact info under        Signed, [place] , PA-C  12/28/2020, 8:41 AM

## 2020-12-28 NOTE — Progress Notes (Signed)
PROGRESS NOTE    Dale [name]  WFU:[id number] DOB: [birth year] DOA: 12/25/2020 PCP: Vesta Mixer, MD   Chief complaint.  Shortness of breath Brief Narrative:  Patient with a history of acute on chronic systolic congestive heart failure coming in with lower extremity edema shortness of breath and orthopnea. Admitted 12/25/2020.Started on IV Lasix. History of hypertension. Cocaine in urine toxicology. Echocardiogram showed ejection fraction less than 20%.   Assessment & Plan:   Principal Problem:   Acute on chronic systolic CHF (congestive heart failure) (HCC) Active Problems:   Essential hypertension   Obesity (BMI 30-39.9)   Substance abuse (HCC)   Acute kidney injury superimposed on CKD (HCC)   Hypokalemia  #1. Acute on chronic systolic congestive heart failure. Ejection fraction less than 20%. Patient continued to improve.  Renal function stable.  Appreciate cardiology consult.  I will continue IV Lasix for another day, anticipating discharge tomorrow.  2.  Acute kidney injury on chronic kidney disease stage IIIa. Renal function is better, stable today.  3.  Hypokalemia. Normalized.  4.  Cocaine abuse. Patient is scheduled to check into inpatient rehab on Friday.  5.  Morbid obesity.     DVT prophylaxis: Lovenox Code Status: Full Family Communication:  Disposition Plan:  .   Status is: Inpatient  Remains inpatient appropriate because:Inpatient level of care appropriate due to severity of illness   Dispo: The patient is from: Home              Anticipated d/c is to: Home              Patient currently is not medically stable to d/c.   Difficult to place patient No        I/O last 3 completed shifts: In: 1860 [P.O.:1860] Out: 4100 [Urine:4100] Total I/O In: 120 [P.O.:120] Out: 1425 [Urine:1425]     Consultants:   Cardiology  Procedures: None  Antimicrobials: None  Subjective: Patient feels much better with shortness of breath.   Could not sleep at nighttime, requesting a stronger sleeping pill. No abdominal pain nausea vomiting. No fever or chills.  Objective: Vitals:   12/27/20 2015 12/28/20 0511 12/28/20 0616 12/28/20 0740  BP: 111/76 116/84  103/77  Pulse: 91 82  78  Resp: 20 20    Temp: 98.6 F (37 C) 97.6 F (36.4 C)  (!) 97.5 F (36.4 C)  TempSrc:  Oral  Oral  SpO2: 100% 100%  96%  Weight:   122.8 kg   Height:        Intake/Output Summary (Last 24 hours) at 12/28/2020 1035 Last data filed at 12/28/2020 1035 Gross per 24 hour  Intake 1620 ml  Output 5150 ml  Net -3530 ml   Filed Weights   12/26/20 0343 12/27/20 0324 12/28/20 0616  Weight: 119.3 kg 121.4 kg 122.8 kg    Examination:  General exam: Appears calm and comfortable  Respiratory system: Clear to auscultation. Respiratory effort normal. Cardiovascular system: S1 & S2 heard, RRR. No JVD, murmurs, rubs, gallops or clicks. 1+ pedal edema. Gastrointestinal system: Abdomen is nondistended, soft and nontender. No organomegaly or masses felt. Normal bowel sounds heard. Central nervous system: Alert and oriented. No focal neurological deficits. Extremities: Symmetric 5 x 5 power. Skin: No rashes, lesions or ulcers Psychiatry: Judgement and insight appear normal. Mood & affect appropriate.     Data Reviewed: I have personally reviewed following labs and imaging studies  CBC: Recent Labs  Lab 12/25/20 0725 12/28/20 0428  WBC 6.6  6.1  NEUTROABS 4.8 4.2  HGB 10.4* 10.2*  HCT 32.0* 32.1*  MCV 78.0* 79.5*  PLT 271 303   Basic Metabolic Panel: Recent Labs  Lab 12/25/20 0725 12/25/20 0936 12/26/20 0438 12/27/20 0412 12/28/20 0428  NA 134*  --  138 138 137  K 3.3*  --  3.2* 4.1 4.1  CL 98  --  102 104 105  CO2 27  --  26 26 24   GLUCOSE 150*  --  129* 115* 100*  BUN 39*  --  41* 41* 42*  CREATININE 2.21*  --  1.73* 1.48* 1.48*  CALCIUM 8.6*  --  8.8* 8.8* 8.8*  MG  --  2.1  --  2.3 2.2   GFR: Estimated Creatinine  Clearance: 73.5 mL/min (A) (by C-G formula based on SCr of 1.48 mg/dL (H)). Liver Function Tests: Recent Labs  Lab 12/25/20 0725  AST 20  ALT 22  ALKPHOS 102  BILITOT 1.1  PROT 7.2  ALBUMIN 3.4*   No results for input(s): LIPASE, AMYLASE in the last 168 hours. No results for input(s): AMMONIA in the last 168 hours. Coagulation Profile: No results for input(s): INR, PROTIME in the last 168 hours. Cardiac Enzymes: No results for input(s): CKTOTAL, CKMB, CKMBINDEX, TROPONINI in the last 168 hours. BNP (last 3 results) No results for input(s): PROBNP in the last 8760 hours. HbA1C: No results for input(s): HGBA1C in the last 72 hours. CBG: No results for input(s): GLUCAP in the last 168 hours. Lipid Profile: No results for input(s): CHOL, HDL, LDLCALC, TRIG, CHOLHDL, LDLDIRECT in the last 72 hours. Thyroid Function Tests: No results for input(s): TSH, T4TOTAL, FREET4, T3FREE, THYROIDAB in the last 72 hours. Anemia Panel: No results for input(s): VITAMINB12, FOLATE, FERRITIN, TIBC, IRON, RETICCTPCT in the last 72 hours. Sepsis Labs: No results for input(s): PROCALCITON, LATICACIDVEN in the last 168 hours.  Recent Results (from the past 240 hour(s))  Resp Panel by RT-PCR (Flu A&B, Covid) Nasopharyngeal Swab     Status: None   Collection Time: 12/25/20  7:25 AM   Specimen: Nasopharyngeal Swab; Nasopharyngeal(NP) swabs in vial transport medium  Result Value Ref Range Status   SARS Coronavirus 2 by RT PCR NEGATIVE NEGATIVE Final    Comment: (NOTE) SARS-CoV-2 target nucleic acids are NOT DETECTED.  The SARS-CoV-2 RNA is generally detectable in upper respiratory specimens during the acute phase of infection. The lowest concentration of SARS-CoV-2 viral copies this assay can detect is 138 copies/mL. A negative result does not preclude SARS-Cov-2 infection and should not be used as the sole basis for treatment or other patient management decisions. A negative result may occur with   improper specimen collection/handling, submission of specimen other than nasopharyngeal swab, presence of viral mutation(s) within the areas targeted by this assay, and inadequate number of viral copies(<138 copies/mL). A negative result must be combined with clinical observations, patient history, and epidemiological information. The expected result is Negative.  Fact Sheet for Patients:  12/27/20  Fact Sheet for Healthcare Providers:  BloggerCourse.com  This test is no t yet approved or cleared by the [place] FDA and  has been authorized for detection and/or diagnosis of SARS-CoV-2 by FDA under an Emergency Use Authorization (EUA). This EUA will remain  in effect (meaning this test can be used) for the duration of the COVID-19 declaration under Section 564(b)(1) of the Act, 21 U.S.C.section 360bbb-3(b)(1), unless the authorization is terminated  or revoked sooner.       Influenza A by PCR  NEGATIVE NEGATIVE Final   Influenza B by PCR NEGATIVE NEGATIVE Final    Comment: (NOTE) The Xpert Xpress SARS-CoV-2/FLU/RSV plus assay is intended as an aid in the diagnosis of influenza from Nasopharyngeal swab specimens and should not be used as a sole basis for treatment. Nasal washings and aspirates are unacceptable for Xpert Xpress SARS-CoV-2/FLU/RSV testing.  Fact Sheet for Patients: BloggerCourse.com  Fact Sheet for Healthcare Providers: SeriousBroker.it  This test is not yet approved or cleared by the [place] FDA and has been authorized for detection and/or diagnosis of SARS-CoV-2 by FDA under an Emergency Use Authorization (EUA). This EUA will remain in effect (meaning this test can be used) for the duration of the COVID-19 declaration under Section 564(b)(1) of the Act, 21 U.S.C. section 360bbb-3(b)(1), unless the authorization is terminated  or revoked.  Performed at [facility], [address] [place], [place] [address]          Radiology Studies: No results found.      Scheduled Meds: . aspirin EC  81 mg Oral Daily  . atorvastatin  40 mg Oral Daily  . carvedilol  6.25 mg Oral BID WC  . enoxaparin (LOVENOX) injection  0.5 mg/kg Subcutaneous Q24H  . furosemide  60 mg Intravenous BID  . gabapentin  300 mg Oral QHS  . isosorbide-hydrALAZINE  1 tablet Oral TID  . melatonin  5 mg Oral QHS  . pantoprazole  40 mg Oral Daily  . potassium chloride  40 mEq Oral BID  . sodium chloride flush  3 mL Intravenous Q12H   Continuous Infusions: . sodium chloride       LOS: 3 days    Time spent: 27 minutes    Marrion Coy, MD [facility]   To contact the attending provider between 7A-7P or the covering provider during after hours 7P-7A, please log into the web site [email or web address] and access using universal [place] password for that web site. If you do not have the password, please call the hospital operator.  12/28/2020, 10:35 AM

## 2020-12-28 NOTE — Progress Notes (Signed)
   Heart Failure Nurse Navigator Note  HFrEF less than 20%.  Moderately decreased right ventricular systolic function.  Mild mitral regurgitation by echocardiogram performed December 14, 2020.  He presented with increasing shortness of breath for the last several days, along with lower extremity edema.  Chest x-ray revealed pulmonary congestion that was mild.  He had just been discharged from the hospital on February 13, was felt still be in volume overload but patient insisted on being discharged.  Comorbidities:   Hypertension Hyperlipidemia Chronic kidney disease Anemia Morbid obesity Coronary artery disease    He has a known history of substance abuse, cocaine, urinalysis on admission was positive for cocaine and cannabis.    Medications:  Aspirin 81 mg daily Atorvastatin 40 mg daily Carvedilol 6.25 mg daily with meals Furosemide 60 mg IV twice a day Isosorbide/hydralazine 20/37.51 tablet 3 times a day Potassium chloride 40 mEq 2 times a day  Per cardiology plan to add back losartan with improved renal function, also consider adding SGL T2 and Entresto as an outpatient.  Labs:  Sodium 137, potassium 4.1, chloride 105, CO2 24, BUN 42, creatinine 1.48-unchanged from yesterday, magnesium 2.3, Intake 1860 mL Output 3725 mL Weight 122.8 yesterday's weight was 121.4 question in the fashion that he was awaiting bed versus scale. Blood pressure 97/77 BMI 37.76    Assessment:   General-he is awake and alert sitting up in the chair watching television.  He states that he continues to have a good urine output.  HEENT-pupils are equal, nonicteric no JVD.  Cardiac-heart tones of regular rate and rhythm no murmurs or rubs appreciated.  Chest-lungs are clear to posterior auscultation.  Abdomen-rounded soft nondistended.  Musculoskeletal-continues with 1+ edema, TED hose are in place.  Psych-is pleasant, talkative, makes good eye contact.  Neurologic-speech is clear,  moves all extremities without difficulty.   Met with patient today, states that he realizes after the cardiologist visited that he still has fluid on board is not ready for discharge.  Reinforced taking good care of himself by daily weights, continuing with a low sodium diet and fluid restriction.  He again talked about wanting to break this cycle of taking drugs, and he knows going back to his previous living situation is not a way that is going to happen.  Very anxious to be going to rehab and states that he is committed about breaking the cycle.   Will continue to follow.  Pricilla Riffle RN, CHFN

## 2020-12-28 NOTE — Progress Notes (Signed)
Pt frequently requesting ginger ale and orange juice. Pt aware of his fluid restriction but has been noncompliant with it this shift. Pt alert and oriented. RN notified.

## 2020-12-29 DIAGNOSIS — N179 Acute kidney failure, unspecified: Secondary | ICD-10-CM | POA: Diagnosis not present

## 2020-12-29 DIAGNOSIS — E876 Hypokalemia: Secondary | ICD-10-CM | POA: Diagnosis not present

## 2020-12-29 DIAGNOSIS — I5023 Acute on chronic systolic (congestive) heart failure: Secondary | ICD-10-CM | POA: Diagnosis not present

## 2020-12-29 DIAGNOSIS — N189 Chronic kidney disease, unspecified: Secondary | ICD-10-CM | POA: Diagnosis not present

## 2020-12-29 DIAGNOSIS — I10 Essential (primary) hypertension: Secondary | ICD-10-CM | POA: Diagnosis not present

## 2020-12-29 LAB — CBC WITH DIFFERENTIAL/PLATELET
Abs Immature Granulocytes: 0.02 10*3/uL (ref 0.00–0.07)
Basophils Absolute: 0 10*3/uL (ref 0.0–0.1)
Basophils Relative: 1 %
Eosinophils Absolute: 0.1 10*3/uL (ref 0.0–0.5)
Eosinophils Relative: 2 %
HCT: 32.9 % — ABNORMAL LOW (ref 39.0–52.0)
Hemoglobin: 10.9 g/dL — ABNORMAL LOW (ref 13.0–17.0)
Immature Granulocytes: 0 %
Lymphocytes Relative: 16 %
Lymphs Abs: 1 10*3/uL (ref 0.7–4.0)
MCH: 25.8 pg — ABNORMAL LOW (ref 26.0–34.0)
MCHC: 33.1 g/dL (ref 30.0–36.0)
MCV: 77.8 fL — ABNORMAL LOW (ref 80.0–100.0)
Monocytes Absolute: 0.7 10*3/uL (ref 0.1–1.0)
Monocytes Relative: 12 %
Neutro Abs: 4 10*3/uL (ref 1.7–7.7)
Neutrophils Relative %: 69 %
Platelets: 307 10*3/uL (ref 150–400)
RBC: 4.23 MIL/uL (ref 4.22–5.81)
RDW: 18.1 % — ABNORMAL HIGH (ref 11.5–15.5)
WBC: 5.9 10*3/uL (ref 4.0–10.5)
nRBC: 0 % (ref 0.0–0.2)

## 2020-12-29 LAB — BASIC METABOLIC PANEL
Anion gap: 7 (ref 5–15)
BUN: 41 mg/dL — ABNORMAL HIGH (ref 6–20)
CO2: 27 mmol/L (ref 22–32)
Calcium: 9.1 mg/dL (ref 8.9–10.3)
Chloride: 106 mmol/L (ref 98–111)
Creatinine, Ser: 1.56 mg/dL — ABNORMAL HIGH (ref 0.61–1.24)
GFR, Estimated: 51 mL/min — ABNORMAL LOW (ref >60)
Glucose, Bld: 121 mg/dL — ABNORMAL HIGH (ref 70–99)
Potassium: 4.3 mmol/L (ref 3.5–5.1)
Sodium: 140 mmol/L (ref 135–145)

## 2020-12-29 LAB — MAGNESIUM: Magnesium: 2.4 mg/dL (ref 1.7–2.4)

## 2020-12-29 LAB — BRAIN NATRIURETIC PEPTIDE: B Natriuretic Peptide: 1290.7 pg/mL — ABNORMAL HIGH (ref 0.0–100.0)

## 2020-12-29 MED ORDER — FUROSEMIDE 10 MG/ML IJ SOLN
60.0000 mg | Freq: Three times a day (TID) | INTRAMUSCULAR | Status: DC
  Administered 2020-12-29 – 2020-12-31 (×5): 60 mg via INTRAVENOUS
  Filled 2020-12-29 (×5): qty 6

## 2020-12-29 NOTE — Progress Notes (Addendum)
   Heart Failure Nurse Navigator Note  HFrEF< 20%.  Moderately decreased right ventricular systolic function.  Mild mitral regurgitation by echocardiogram performed December 14, 2020.  He presented with increasing shortness of breath for the last several days along with lower extremity edema chest x-ray revealed pulmonary congestion that was mild.  He had been discharged from the hospital on February 13 and was felt to be all in volume overload but patient insisted on being discharged and did not take his diuretic as directed.  Comorbidities:  Hypertension Hyperlipidemia Chronic kidney disease Anemia Morbid obesity Coronary artery disease  He has a known history of substance abuse, cocaine, urinalysis on admission was positive for cocaine and cannabis.   Medications:  Aspirin 81 mg daily Atorvastatin 40 mg daily Coreg 6.25 mg 2 times a day with meals Furosemide 60 mg IV 2 times a day Isosorbide/hydralazine 20/37.5 mg 1 tablet 3 times a day Potassium chloride 40 mEq  Labs:  Sodium 146, potassium 4.3, chloride 106, CO2 27, BUN 41, creatinine 1.56 up from 1.48 of yesterday, magnesium 2.4, BNP 1290, hemoglobin 10.9, hematocrit 32.9 Intake 1820 Output 2325 mL Weight 118.3 down from 122.8 of yesterday BMI 36.37 Blood pressure 116/70  Assessment:  General-he is awake and alert lying in bed in no acute distress.  HEENT-pupils are equal, non icteric  Cardiac-heart tones of regular rate and rhythm  Chest-breath sounds are clear to posterior auscultation, diminished in the bases.  Abdomen-rounded soft nontender.  Musculoskeletal-he continues to wear TED hose, the tops of his feet are less puffy,  Neurologic-speech is clear, with all extremities without difficulty.  Psych-he is pleasant and appropriate makes good eye contact.   Sat down with patient today for greater than 30 minutes.  Discussed once he is released how it is going to take care of himself by teach back  method.  Discussed diet, eating fresh fruits and vegetables.  Stressed the importance of sticking with the 1500 mL fluid restriction that he is on and informed him that he had gone over his restriction of yesterday by 320 mL.  He asked that I pour the water out of his pitcher that is at bedside.  Reminded that popsicles are also considered part of his fluid restriction.  Reds vest  reading today was 46%. BNP 1290.  On the monitor patient was noted to have a 14 beat run of nonsustained VT.  Question if he does not need LifeVest/ICD.

## 2020-12-29 NOTE — Progress Notes (Signed)
PROGRESS NOTE    Dale [name]  HLK:[id number] DOB: [birth year] DOA: 12/25/2020 PCP: Vesta Mixer, MD   Chief complaint.  Shortness of breath.    Brief Narrative:  Patient with a history of acute on chronic systolic congestive heart failure coming in with lower extremity edema shortness of breath and orthopnea. Admitted 12/25/2020.Started on IV Lasix. History of hypertension. Cocaine in urine toxicology. Echocardiogramshowed ejection fraction less than 20%.   Assessment & Plan:   Principal Problem:   Acute on chronic systolic CHF (congestive heart failure) (HCC) Active Problems:   Essential hypertension   Obesity (BMI 30-39.9)   Substance abuse (HCC)   Acute kidney injury superimposed on CKD (HCC)   Hypokalemia  #1. Acute on chronic systolic congestive heart failure with ejection fraction less than 20%. Patient is diuresing well, but still has significant volume overload with elevated BNP.  New IV Lasix.  2.  Acute kidney injury on chronic kidney disease stage IIIa. Renal function initially better, creatinine started going up slightly after diuretics.  Will monitor closely.  3.  Cocaine abuse. Patient will be admitted to inpatient rehab after leaving the hospital on Monday.  4.  Morbid obesity.    DVT prophylaxis: Lovenox Code Status: Full Family Communication:  Disposition Plan:  .   Status is: Inpatient  Remains inpatient appropriate because:Inpatient level of care appropriate due to severity of illness   Dispo: The patient is from: Home              Anticipated d/c is to: Home              Patient currently is not medically stable to d/c.   Difficult to place patient No        I/O last 3 completed shifts: In: 2360 [P.O.:2360] Out: 3500 [Urine:3500] Total I/O In: 480 [P.O.:480] Out: 1450 [Urine:1450]     Consultants:   Cardiology  Procedures: None  Antimicrobials: None  Subjective: Patient slept well last night, did not have any  paroxysmal nocturnal dyspnea. Short of breath also improving. No abdominal pain or nausea vomiting. No fever or chills. No dysuria or hematuria.  Objective: Vitals:   12/28/20 1900 12/29/20 0343 12/29/20 0718 12/29/20 1115  BP: 117/63 123/70 121/75 116/70  Pulse:  82 89 90  Resp:  19 19 18   Temp: 97.8 F (36.6 C) 98.1 F (36.7 C) 98.3 F (36.8 C) 98.1 F (36.7 C)  TempSrc: Oral Oral Oral Oral  SpO2: 100% 100% 98% 97%  Weight:  118.3 kg    Height:        Intake/Output Summary (Last 24 hours) at 12/29/2020 1313 Last data filed at 12/29/2020 1117 Gross per 24 hour  Intake 2180 ml  Output 2350 ml  Net -170 ml   Filed Weights   12/27/20 0324 12/28/20 0616 12/29/20 0343  Weight: 121.4 kg 122.8 kg 118.3 kg    Examination:  General exam: Appears calm and comfortable  Respiratory system: Clear to auscultation. Respiratory effort normal. Cardiovascular system: S1 & S2 heard, RRR. No JVD, murmurs, rubs, gallops or clicks.  Gastrointestinal system: Abdomen is nondistended, soft and nontender. No organomegaly or masses felt. Normal bowel sounds heard. Central nervous system: Alert and oriented. No focal neurological deficits. Extremities: 2+ edema in LE Skin: No rashes, lesions or ulcers Psychiatry: Judgement and insight appear normal. Mood & affect appropriate.     Data Reviewed: I have personally reviewed following labs and imaging studies  CBC: Recent Labs  Lab 12/25/20 0725 12/28/20  0272 12/29/20 0608  WBC 6.6 6.1 5.9  NEUTROABS 4.8 4.2 4.0  HGB 10.4* 10.2* 10.9*  HCT 32.0* 32.1* 32.9*  MCV 78.0* 79.5* 77.8*  PLT 271 303 307   Basic Metabolic Panel: Recent Labs  Lab 12/25/20 0725 12/25/20 0936 12/26/20 0438 12/27/20 0412 12/28/20 0428 12/29/20 0608  NA 134*  --  138 138 137 140  K 3.3*  --  3.2* 4.1 4.1 4.3  CL 98  --  102 104 105 106  CO2 27  --  26 26 24 27   GLUCOSE 150*  --  129* 115* 100* 121*  BUN 39*  --  41* 41* 42* 41*  CREATININE 2.21*  --   1.73* 1.48* 1.48* 1.56*  CALCIUM 8.6*  --  8.8* 8.8* 8.8* 9.1  MG  --  2.1  --  2.3 2.2 2.4   GFR: Estimated Creatinine Clearance: 68.4 mL/min (A) (by C-G formula based on SCr of 1.56 mg/dL (H)). Liver Function Tests: Recent Labs  Lab 12/25/20 0725  AST 20  ALT 22  ALKPHOS 102  BILITOT 1.1  PROT 7.2  ALBUMIN 3.4*   No results for input(s): LIPASE, AMYLASE in the last 168 hours. No results for input(s): AMMONIA in the last 168 hours. Coagulation Profile: No results for input(s): INR, PROTIME in the last 168 hours. Cardiac Enzymes: No results for input(s): CKTOTAL, CKMB, CKMBINDEX, TROPONINI in the last 168 hours. BNP (last 3 results) No results for input(s): PROBNP in the last 8760 hours. HbA1C: No results for input(s): HGBA1C in the last 72 hours. CBG: No results for input(s): GLUCAP in the last 168 hours. Lipid Profile: No results for input(s): CHOL, HDL, LDLCALC, TRIG, CHOLHDL, LDLDIRECT in the last 72 hours. Thyroid Function Tests: No results for input(s): TSH, T4TOTAL, FREET4, T3FREE, THYROIDAB in the last 72 hours. Anemia Panel: No results for input(s): VITAMINB12, FOLATE, FERRITIN, TIBC, IRON, RETICCTPCT in the last 72 hours. Sepsis Labs: No results for input(s): PROCALCITON, LATICACIDVEN in the last 168 hours.  Recent Results (from the past 240 hour(s))  Resp Panel by RT-PCR (Flu A&B, Covid) Nasopharyngeal Swab     Status: None   Collection Time: 12/25/20  7:25 AM   Specimen: Nasopharyngeal Swab; Nasopharyngeal(NP) swabs in vial transport medium  Result Value Ref Range Status   SARS Coronavirus 2 by RT PCR NEGATIVE NEGATIVE Final    Comment: (NOTE) SARS-CoV-2 target nucleic acids are NOT DETECTED.  The SARS-CoV-2 RNA is generally detectable in upper respiratory specimens during the acute phase of infection. The lowest concentration of SARS-CoV-2 viral copies this assay can detect is 138 copies/mL. A negative result does not preclude SARS-Cov-2 infection and  should not be used as the sole basis for treatment or other patient management decisions. A negative result may occur with  improper specimen collection/handling, submission of specimen other than nasopharyngeal swab, presence of viral mutation(s) within the areas targeted by this assay, and inadequate number of viral copies(<138 copies/mL). A negative result must be combined with clinical observations, patient history, and epidemiological information. The expected result is Negative.  Fact Sheet for Patients:  12/27/20  Fact Sheet for Healthcare Providers:  BloggerCourse.com  This test is no t yet approved or cleared by the [employer] FDA and  has been authorized for detection and/or diagnosis of SARS-CoV-2 by FDA under an Emergency Use Authorization (EUA). This EUA will remain  in effect (meaning this test can be used) for the duration of the COVID-19 declaration under Section 564(b)(1) of the  Act, 21 U.S.C.section 360bbb-3(b)(1), unless the authorization is terminated  or revoked sooner.       Influenza A by PCR NEGATIVE NEGATIVE Final   Influenza B by PCR NEGATIVE NEGATIVE Final    Comment: (NOTE) The Xpert Xpress SARS-CoV-2/FLU/RSV plus assay is intended as an aid in the diagnosis of influenza from Nasopharyngeal swab specimens and should not be used as a sole basis for treatment. Nasal washings and aspirates are unacceptable for Xpert Xpress SARS-CoV-2/FLU/RSV testing.  Fact Sheet for Patients: BloggerCourse.com  Fact Sheet for Healthcare Providers: SeriousBroker.it  This test is not yet approved or cleared by the [place] FDA and has been authorized for detection and/or diagnosis of SARS-CoV-2 by FDA under an Emergency Use Authorization (EUA). This EUA will remain in effect (meaning this test can be used) for the duration of the COVID-19 declaration  under Section 564(b)(1) of the Act, 21 U.S.C. section 360bbb-3(b)(1), unless the authorization is terminated or revoked.  Performed at [facility], [address] [place], [place] [address]          Radiology Studies: No results found.      Scheduled Meds: . aspirin EC  81 mg Oral Daily  . atorvastatin  40 mg Oral Daily  . carvedilol  6.25 mg Oral BID WC  . enoxaparin (LOVENOX) injection  0.5 mg/kg Subcutaneous Q24H  . furosemide  60 mg Intravenous BID  . gabapentin  300 mg Oral QHS  . isosorbide-hydrALAZINE  1 tablet Oral TID  . melatonin  5 mg Oral QHS  . pantoprazole  40 mg Oral Daily  . potassium chloride  40 mEq Oral BID  . sodium chloride flush  3 mL Intravenous Q12H   Continuous Infusions: . sodium chloride       LOS: 4 days    Time spent: 28 minutes    Marrion Coy, MD [facility]   To contact the attending provider between 7A-7P or the covering provider during after hours 7P-7A, please log into the web site [email or web address] and access using universal [place] password for that web site. If you do not have the password, please call the hospital operator.  12/29/2020, 1:13 PM

## 2020-12-29 NOTE — Progress Notes (Signed)
14 beats Vtach, patient asymptomatic. Electrolytes this AM normal. Will not increase BB given acute CHF  RedsVest 46%, continue diuresis  Lavanna Rog Fransico Michael, PA-C

## 2020-12-29 NOTE — Progress Notes (Addendum)
Progress Note  Patient Name: Dale [name] Date of Encounter: 12/29/2020  [facility] Cardiologist: Kristeen Miss, MD   Subjective   UOP -2.3L. Creatinine relatively stable. Patient feels breathing is better but is short of breath during interview. No chest pain. He is agreeable to go to 30 day rehab, he is asking about discharge.   Inpatient Medications    Scheduled Meds: . aspirin EC  81 mg Oral Daily  . atorvastatin  40 mg Oral Daily  . carvedilol  6.25 mg Oral BID WC  . enoxaparin (LOVENOX) injection  0.5 mg/kg Subcutaneous Q24H  . furosemide  60 mg Intravenous BID  . gabapentin  300 mg Oral QHS  . isosorbide-hydrALAZINE  1 tablet Oral TID  . melatonin  5 mg Oral QHS  . pantoprazole  40 mg Oral Daily  . potassium chloride  40 mEq Oral BID  . sodium chloride flush  3 mL Intravenous Q12H   Continuous Infusions: . sodium chloride     PRN Meds: sodium chloride, acetaminophen, ondansetron (ZOFRAN) IV, sodium chloride flush, traZODone   Vital Signs    Vitals:   12/28/20 1633 12/28/20 1900 12/29/20 0343 12/29/20 0718  BP: 112/64 117/63 123/70 121/75  Pulse: 86  82 89  Resp:   19 19  Temp: (!) 97.4 F (36.3 C) 97.8 F (36.6 C) 98.1 F (36.7 C) 98.3 F (36.8 C)  TempSrc: Oral Oral Oral Oral  SpO2: 99% 100% 100% 98%  Weight:   118.3 kg   Height:        Intake/Output Summary (Last 24 hours) at 12/29/2020 0824 Last data filed at 12/29/2020 0549 Gross per 24 hour  Intake 1820 ml  Output 2100 ml  Net -280 ml   Last 3 Weights 12/29/2020 12/28/2020 12/27/2020  Weight (lbs) 260 lb 12.9 oz 270 lb 11.6 oz 267 lb 10.2 oz  Weight (kg) 118.3 kg 122.8 kg 121.4 kg      Telemetry    NSR, PAC/PVCs, blocked PACs, HR 80s - Personally Reviewed  ECG    No new - Personally Reviewed  Physical Exam   GEN: No acute distress.   Neck: + JVD Cardiac: RRR, no murmurs, rubs, or gallops.  Respiratory: diminished at bases GI: Soft, nontender, non-distended  MS: No edema; No  deformity. Neuro:  Nonfocal  Psych: Normal affect   Labs    High Sensitivity Troponin:   Recent Labs  Lab 12/12/20 2302 12/13/20 0013 12/25/20 0725 12/25/20 0936  TROPONINIHS 30* 29* 30* 29*      Chemistry Recent Labs  Lab 12/25/20 0725 12/26/20 0438 12/27/20 0412 12/28/20 0428 12/29/20 0608  NA 134*   < > 138 137 140  K 3.3*   < > 4.1 4.1 4.3  CL 98   < > 104 105 106  CO2 27   < > 26 24 27   GLUCOSE 150*   < > 115* 100* 121*  BUN 39*   < > 41* 42* 41*  CREATININE 2.21*   < > 1.48* 1.48* 1.56*  CALCIUM 8.6*   < > 8.8* 8.8* 9.1  PROT 7.2  --   --   --   --   ALBUMIN 3.4*  --   --   --   --   AST 20  --   --   --   --   ALT 22  --   --   --   --   ALKPHOS 102  --   --   --   --  BILITOT 1.1  --   --   --   --   GFRNONAA 34*   < > 55* 55* 51*  ANIONGAP 9   < > 8 8 7    < > = values in this interval not displayed.     Hematology Recent Labs  Lab 12/25/20 0725 12/28/20 0428 12/29/20 0608  WBC 6.6 6.1 5.9  RBC 4.10* 4.04* 4.23  HGB 10.4* 10.2* 10.9*  HCT 32.0* 32.1* 32.9*  MCV 78.0* 79.5* 77.8*  MCH 25.4* 25.2* 25.8*  MCHC 32.5 31.8 33.1  RDW 17.1* 17.6* 18.1*  PLT 271 303 307    BNP Recent Labs  Lab 12/25/20 0725  BNP 2,743.6*     DDimer No results for input(s): DDIMER in the last 168 hours.   Radiology    No results found.  Cardiac Studies    2D echo 12/14/2020: 1. Left ventricular ejection fraction, by estimation, is <20%. The left  ventricle has severely decreased function. The left ventricle demonstrates  global hypokinesis. The left ventricular internal cavity size was severely  dilated. Left ventricular  diastolic parameters are indeterminate.  2. Right ventricular systolic function is moderately reduced. The right  ventricular size is mildly enlarged. There is normal pulmonary artery  systolic pressure.  3. The mitral valve is normal in structure. Mild mitral valve  Regurgitation. __________  2D echo 09/2020: 1. Left  ventricular ejection fraction, by estimation, is approximately  20%. The left ventricle has severely decreased function. The left  ventricle demonstrates global hypokinesis with regional variation as  noted. The left ventricular internal cavity  size was moderately dilated. Left ventricular diastolic parameters are  consistent with Grade III diastolic dysfunction (restrictive). No obvious  mural thrombus noted with Definity contrast.  2. Right ventricular systolic function is moderately to severely reduced.  The right ventricular size is normal. There is moderately elevated  pulmonary artery systolic pressure. The estimated right ventricular  systolic pressure is 52.0 mmHg.  3. Left atrial size was mild to moderately dilated.  4. Right atrial size was mild to moderately dilated.  5. The mitral valve is grossly normal. Mild mitral valve regurgitation.  6. The aortic valve is tricuspid. Aortic valve regurgitation is trivial.  7. The inferior vena cava is dilated in size with <50% respiratory  variability, suggesting right atrial pressure of 15 mmHg. __________  [facility] 05/2019: Conclusions: 1. Severe single-vessel coronary artery disease with chronic total occlusion of the proximal RCA. 2. Mild to moderate, non-obstructive coronary artery disease involving the LAD and LCx. 3. Moderately elevated left heart filling pressures. 4. Severely elevated right heart filling pressures. 5. Mild pulmonary hypertension. 6. Normal Fick cardiac output/index.  Recommendations: 1. 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 Patient will be transported back to [facility] after recovery from today's procedure. 2. Continue diuresis and optimization of evidence-based heart failure therapy. I will restart furosemide 40 mg IV BID this afternoon. 3. Secondary prevention of coronary artery disease, including high-intensity statin therapy and avoidance of  cocaine. __________  2D echo 03/2019: 1. The left ventricle has severely reduced systolic function, with an  ejection fraction of 20-25%. The cavity size was moderately dilated. There  is mildly increased left ventricular wall thickness. Left ventricular  diastolic Doppler parameters are  consistent with restrictive filling. Elevated left ventricular  end-diastolic pressure.  2. Diffuse hypokinesis worse in the inferior wall.  3. The right ventricle has normal systolic function. The cavity was  moderately enlarged. There is no increase  in right ventricular wall  thickness.  4. Right atrial size was mildly dilated.  5. Mild thickening of the mitral valve leaflet.  6. The aortic valve is tricuspid. Mild thickening of the aortic valve.  Mild calcification of the aortic valve.  __________  2D echo 09/2018: - Left ventricle: The cavity size was mildly dilated. Wall  thickness was increased in a pattern of mild LVH. Systolic  function was severely reduced. The estimated ejection fraction  was in the range of 20% to 25%. Diffuse hypokinesis. Features are  consistent with a pseudonormal left ventricular filling pattern,  with concomitant abnormal relaxation and increased filling  pressure (grade 2 diastolic dysfunction). Doppler parameters are  consistent with high ventricular filling pressure.  - Aortic valve: There was trivial regurgitation.  - Mitral valve: There was mild regurgitation.  - Left atrium: The atrium was severely dilated.  - Right ventricle: The cavity size was mildly dilated. Systolic  function was mildly reduced.  - Right atrium: The atrium was moderately dilated.  - Pericardium, extracardiac: A trivial pericardial effusion was  identified.   Impressions:   - Severe global reduction in LV systolic function; mild LVH and  LVE; moderate diastolic dysfunction; trace AI; mild MR; biatrial  enlargement; mild RVE with mild RV  dysfunction.  Patient Profile     60 y.o. 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 Resume as able and plan to transition to [facility] - Consider addition of spiro and SGLT2i as outpatient. - I think he needs more diuresis however is wondering about going to rehab today. I'll check a BNP, consider [facility].   CAD Elevated troponin - Troponin minimally elevated and flat, not consistent with ACS - No IV heparin - No reported chest pain - continue aspirin, coreg and statin - Plan for ischemic work-up as outpatient  AKI/CKD stage 2  - creatinine relatively stable today  HTN - Pressures wnl - continue current medications  HLD - LDL 111, goal<70 - continue Lipitor  Anemia - stable  Polysubstance abuse - Urine drug screen positive for THC and cocaine. Reports last use 3 weeks ago - cessation discussed, agreeable to reahb  For questions or updates, please contact [facility] Please consult [email or web address] for contact info under        Signed, [place] , PA-C  12/29/2020, 8:24 AM

## 2020-12-29 NOTE — TOC Progression Note (Signed)
Transition of Care [facility]) - Progression Note    Patient Details  Name: Dale [name] MRN: [id number] Date of Birth: [birth year]  Transition [facility]) CM/SW Contact  Maree Krabbe, LCSW Phone Number: 12/29/2020, 1:49 PM  Clinical Narrative:  Pt still need IV lasix. CSW spoke with Dennie Bible at [facility] and she is aware it would be Monday before pt would dc. They will save room for pt. MD aware.           Expected Discharge Plan and Services           Expected Discharge Date: 12/27/20                                     Social Determinants of Health (SDOH) Interventions    Readmission Risk Interventions Readmission Risk Prevention Plan 12/15/2020 09/11/2020 03/03/2019  Transportation Screening Complete Complete Complete  PCP or Specialist Appt within 3-5 Days - Complete -  HRI or Home Care Consult - Complete -  Social Work Consult for Recovery Care Planning/Counseling - Complete -  Palliative Care Screening - Complete -  Medication Review [employer]) Complete Complete Complete  PCP or Specialist appointment within 3-5 days of discharge Complete - Complete  HRI or Home Care Consult Complete - (No Data)  SW Recovery Care/Counseling Consult Complete - Complete  Palliative Care Screening Not Applicable - Not Applicable  Skilled Nursing Facility Not Applicable - Not Applicable  Some recent data might be hidden

## 2020-12-29 NOTE — Progress Notes (Signed)
CCMD called to report 14 beats of v.tach on tele/ pt asymptomatic / MD made aware/ will monitor.

## 2020-12-30 DIAGNOSIS — F1910 Other psychoactive substance abuse, uncomplicated: Secondary | ICD-10-CM | POA: Diagnosis not present

## 2020-12-30 DIAGNOSIS — N179 Acute kidney failure, unspecified: Secondary | ICD-10-CM | POA: Diagnosis not present

## 2020-12-30 DIAGNOSIS — I5023 Acute on chronic systolic (congestive) heart failure: Secondary | ICD-10-CM | POA: Diagnosis not present

## 2020-12-30 DIAGNOSIS — I10 Essential (primary) hypertension: Secondary | ICD-10-CM | POA: Diagnosis not present

## 2020-12-30 LAB — BASIC METABOLIC PANEL
Anion gap: 8 (ref 5–15)
BUN: 42 mg/dL — ABNORMAL HIGH (ref 6–20)
CO2: 26 mmol/L (ref 22–32)
Calcium: 9 mg/dL (ref 8.9–10.3)
Chloride: 106 mmol/L (ref 98–111)
Creatinine, Ser: 1.44 mg/dL — ABNORMAL HIGH (ref 0.61–1.24)
GFR, Estimated: 57 mL/min — ABNORMAL LOW (ref >60)
Glucose, Bld: 107 mg/dL — ABNORMAL HIGH (ref 70–99)
Potassium: 4.3 mmol/L (ref 3.5–5.1)
Sodium: 140 mmol/L (ref 135–145)

## 2020-12-30 LAB — MAGNESIUM: Magnesium: 2.2 mg/dL (ref 1.7–2.4)

## 2020-12-30 NOTE — Progress Notes (Signed)
PROGRESS NOTE    Dale [name]  FAO:[id number] DOB: [birth year] DOA: 12/25/2020 PCP: Vesta Mixer, MD   Chief complaint.  Shortness of breath. Brief Narrative:  Patient with a history of acute on chronic systolic congestive heart failure coming in with lower extremity edema shortness of breath and orthopnea. Admitted 12/25/2020.Started on IV Lasix. History of hypertension. Cocaine in urine toxicology. Echocardiogramshowed ejection fraction less than 20%.   Assessment & Plan:   Principal Problem:   Acute on chronic systolic CHF (congestive heart failure) (HCC) Active Problems:   Essential hypertension   Obesity (BMI 30-39.9)   Substance abuse (HCC)   Acute kidney injury superimposed on CKD (HCC)   Hypokalemia  #1. Acute on chronic systolic congestive heart failure. Anasarca Patient responding well with diuretics.  Anasarca significantly improved. Still has evidence of volume overload, continue IV Lasix for additional 48 hours.  2.  Acute kidney injury on chronic kidney disease stage IIIa. Renal function still stable after diuretics.  3.  Cocaine abuse. Outpatient rehab program.  4.  Morbid obesity.    DVT prophylaxis: Lovenox Code Status: Full Family Communication:  Disposition Plan:  .   Status is: Inpatient  Remains inpatient appropriate because:Inpatient level of care appropriate due to severity of illness   Dispo: The patient is from: Home              Anticipated d/c is to: Home              Patient currently is not medically stable to d/c.   Difficult to place patient No        I/O last 3 completed shifts: In: 2180 [P.O.:2180] Out: 5075 [Urine:5075] No intake/output data recorded.     Consultants:   Cardiology  Procedures: None  Antimicrobials: None  Subjective: Patient condition continued to improve, he slept well overnight without any paroxysmal nocturnal dyspnea.  Short of breath improved. Abdominal distention and edema much  improved. No abdominal pain or nausea vomiting. No fever or chills. No chest pain or palpitation. No dysuria no hematuria.  Objective: Vitals:   12/29/20 1610 12/29/20 1927 12/30/20 0311 12/30/20 0729  BP: 120/74 (!) 141/113 (!) 86/61 97/71  Pulse: 94 92 81 86  Resp: 18 16 20 18   Temp: 98.3 F (36.8 C) 97.7 F (36.5 C) 97.9 F (36.6 C) 98.5 F (36.9 C)  TempSrc: Oral Oral Oral   SpO2: 97% 100% 100% 98%  Weight:      Height:        Intake/Output Summary (Last 24 hours) at 12/30/2020 0938 Last data filed at 12/30/2020 0352 Gross per 24 hour  Intake 480 ml  Output 4125 ml  Net -3645 ml   Filed Weights   12/27/20 0324 12/28/20 0616 12/29/20 0343  Weight: 121.4 kg 122.8 kg 118.3 kg    Examination:  General exam: Appears calm and comfortable  Respiratory system: Clear to auscultation. Respiratory effort normal. Cardiovascular system: S1 & S2 heard, RRR. No JVD, murmurs, rubs, gallops or clicks. 1-2+ pedal edema. Gastrointestinal system: Abdomen is nondistended, soft and nontender. No organomegaly or masses felt. Normal bowel sounds heard. Central nervous system: Alert and oriented. No focal neurological deficits. Extremities: Symmetric 5 x 5 power. Skin: No rashes, lesions or ulcers Psychiatry:  Mood & affect appropriate.     Data Reviewed: I have personally reviewed following labs and imaging studies  CBC: Recent Labs  Lab 12/25/20 0725 12/28/20 0428 12/29/20 0608  WBC 6.6 6.1 5.9  NEUTROABS 4.8 4.2 4.0  HGB 10.4* 10.2* 10.9*  HCT 32.0* 32.1* 32.9*  MCV 78.0* 79.5* 77.8*  PLT 271 303 307   Basic Metabolic Panel: Recent Labs  Lab 12/25/20 0936 12/26/20 0438 12/27/20 0412 12/28/20 0428 12/29/20 0608 12/30/20 0359  NA  --  138 138 137 140 140  K  --  3.2* 4.1 4.1 4.3 4.3  CL  --  102 104 105 106 106  CO2  --  26 26 24 27 26   GLUCOSE  --  129* 115* 100* 121* 107*  BUN  --  41* 41* 42* 41* 42*  CREATININE  --  1.73* 1.48* 1.48* 1.56* 1.44*  CALCIUM   --  8.8* 8.8* 8.8* 9.1 9.0  MG 2.1  --  2.3 2.2 2.4 2.2   GFR: Estimated Creatinine Clearance: 74 mL/min (A) (by C-G formula based on SCr of 1.44 mg/dL (H)). Liver Function Tests: Recent Labs  Lab 12/25/20 0725  AST 20  ALT 22  ALKPHOS 102  BILITOT 1.1  PROT 7.2  ALBUMIN 3.4*   No results for input(s): LIPASE, AMYLASE in the last 168 hours. No results for input(s): AMMONIA in the last 168 hours. Coagulation Profile: No results for input(s): INR, PROTIME in the last 168 hours. Cardiac Enzymes: No results for input(s): CKTOTAL, CKMB, CKMBINDEX, TROPONINI in the last 168 hours. BNP (last 3 results) No results for input(s): PROBNP in the last 8760 hours. HbA1C: No results for input(s): HGBA1C in the last 72 hours. CBG: No results for input(s): GLUCAP in the last 168 hours. Lipid Profile: No results for input(s): CHOL, HDL, LDLCALC, TRIG, CHOLHDL, LDLDIRECT in the last 72 hours. Thyroid Function Tests: No results for input(s): TSH, T4TOTAL, FREET4, T3FREE, THYROIDAB in the last 72 hours. Anemia Panel: No results for input(s): VITAMINB12, FOLATE, FERRITIN, TIBC, IRON, RETICCTPCT in the last 72 hours. Sepsis Labs: No results for input(s): PROCALCITON, LATICACIDVEN in the last 168 hours.  Recent Results (from the past 240 hour(s))  Resp Panel by RT-PCR (Flu A&B, Covid) Nasopharyngeal Swab     Status: None   Collection Time: 12/25/20  7:25 AM   Specimen: Nasopharyngeal Swab; Nasopharyngeal(NP) swabs in vial transport medium  Result Value Ref Range Status   SARS Coronavirus 2 by RT PCR NEGATIVE NEGATIVE Final    Comment: (NOTE) SARS-CoV-2 target nucleic acids are NOT DETECTED.  The SARS-CoV-2 RNA is generally detectable in upper respiratory specimens during the acute phase of infection. The lowest concentration of SARS-CoV-2 viral copies this assay can detect is 138 copies/mL. A negative result does not preclude SARS-Cov-2 infection and should not be used as the sole basis for  treatment or other patient management decisions. A negative result may occur with  improper specimen collection/handling, submission of specimen other than nasopharyngeal swab, presence of viral mutation(s) within the areas targeted by this assay, and inadequate number of viral copies(<138 copies/mL). A negative result must be combined with clinical observations, patient history, and epidemiological information. The expected result is Negative.  Fact Sheet for Patients:  12/27/20  Fact Sheet for Healthcare Providers:  BloggerCourse.com  This test is no t yet approved or cleared by the [place] FDA and  has been authorized for detection and/or diagnosis of SARS-CoV-2 by FDA under an Emergency Use Authorization (EUA). This EUA will remain  in effect (meaning this test can be used) for the duration of the COVID-19 declaration under Section 564(b)(1) of the Act, 21 U.S.C.section 360bbb-3(b)(1), unless the authorization is terminated  or revoked sooner.  Influenza A by PCR NEGATIVE NEGATIVE Final   Influenza B by PCR NEGATIVE NEGATIVE Final    Comment: (NOTE) The Xpert Xpress SARS-CoV-2/FLU/RSV plus assay is intended as an aid in the diagnosis of influenza from Nasopharyngeal swab specimens and should not be used as a sole basis for treatment. Nasal washings and aspirates are unacceptable for Xpert Xpress SARS-CoV-2/FLU/RSV testing.  Fact Sheet for Patients: BloggerCourse.com  Fact Sheet for Healthcare Providers: SeriousBroker.it  This test is not yet approved or cleared by the [place] FDA and has been authorized for detection and/or diagnosis of SARS-CoV-2 by FDA under an Emergency Use Authorization (EUA). This EUA will remain in effect (meaning this test can be used) for the duration of the COVID-19 declaration under Section 564(b)(1) of the Act, 21  U.S.C. section 360bbb-3(b)(1), unless the authorization is terminated or revoked.  Performed at [facility], [address] [place], [place] [address]          Radiology Studies: No results found.      Scheduled Meds: . aspirin EC  81 mg Oral Daily  . atorvastatin  40 mg Oral Daily  . carvedilol  6.25 mg Oral BID WC  . enoxaparin (LOVENOX) injection  0.5 mg/kg Subcutaneous Q24H  . furosemide  60 mg Intravenous Q8H  . gabapentin  300 mg Oral QHS  . isosorbide-hydrALAZINE  1 tablet Oral TID  . melatonin  5 mg Oral QHS  . pantoprazole  40 mg Oral Daily  . potassium chloride  40 mEq Oral BID  . sodium chloride flush  3 mL Intravenous Q12H   Continuous Infusions: . sodium chloride       LOS: 5 days    Time spent: 28 minutes    Marrion Coy, MD [facility]   To contact the attending provider between 7A-7P or the covering provider during after hours 7P-7A, please log into the web site [email or web address] and access using universal [place] password for that web site. If you do not have the password, please call the hospital operator.  12/30/2020, 9:38 AM

## 2020-12-30 NOTE — Progress Notes (Addendum)
Progress Note  Patient Name: Dale [name] Date of Encounter: 12/30/2020  [facility] Cardiologist: Kristeen Miss, MD   Subjective   IV lasix increased from 60mg  BID to TID. UOP -3.7L. Creatinine stable. Breathing is stable. No chest pain. Tele shows brief NSVT. Plan to discharge to Rehab on Monday. C/o dry mouth    I/O net aobut - 6L     Inpatient Medications    Scheduled Meds: . aspirin EC  81 mg Oral Daily  . atorvastatin  40 mg Oral Daily  . carvedilol  6.25 mg Oral BID WC  . enoxaparin (LOVENOX) injection  0.5 mg/kg Subcutaneous Q24H  . furosemide  60 mg Intravenous Q8H  . gabapentin  300 mg Oral QHS  . isosorbide-hydrALAZINE  1 tablet Oral TID  . melatonin  5 mg Oral QHS  . pantoprazole  40 mg Oral Daily  . potassium chloride  40 mEq Oral BID  . sodium chloride flush  3 mL Intravenous Q12H   Continuous Infusions: . sodium chloride     PRN Meds: sodium chloride, acetaminophen, ondansetron (ZOFRAN) IV, sodium chloride flush, traZODone   Vital Signs    Vitals:   12/29/20 1610 12/29/20 1927 12/30/20 0311 12/30/20 0729  BP: 120/74 (!) 141/113 (!) 86/61 97/71  Pulse: 94 92 81 86  Resp: 18 16 20 18   Temp: 98.3 F (36.8 C) 97.7 F (36.5 C) 97.9 F (36.6 C) 98.5 F (36.9 C)  TempSrc: Oral Oral Oral   SpO2: 97% 100% 100% 98%  Weight:      Height:        Intake/Output Summary (Last 24 hours) at 12/30/2020 0743 Last data filed at 12/30/2020 0352 Gross per 24 hour  Intake 480 ml  Output 4125 ml  Net -3645 ml   Last 3 Weights 12/29/2020 12/28/2020 12/27/2020  Weight (lbs) 260 lb 12.9 oz 270 lb 11.6 oz 267 lb 10.2 oz  Weight (kg) 118.3 kg 122.8 kg 121.4 kg      Telemetry    SR, HR 80s, 6B NSVT - Personally Reviewed  ECG    No new - Personally Reviewed  Physical Exam   GEN: No acute distress.   Neck: No JVD Cardiac: RRR, no murmurs, rubs, or gallops.  Respiratory: Clear to auscultation bilaterally. GI: Soft, nontender, non-distended  MS: No  edema; No deformity. Neuro:  Nonfocal  Psych: Normal affect      Labs    High Sensitivity Troponin:   Recent Labs  Lab 12/12/20 2302 12/13/20 0013 12/25/20 0725 12/25/20 0936  TROPONINIHS 30* 29* 30* 29*      Chemistry Recent Labs  Lab 12/25/20 0725 12/26/20 0438 12/28/20 0428 12/29/20 0608 12/30/20 0359  NA 134*   < > 137 140 140  K 3.3*   < > 4.1 4.3 4.3  CL 98   < > 105 106 106  CO2 27   < > 24 27 26   GLUCOSE 150*   < > 100* 121* 107*  BUN 39*   < > 42* 41* 42*  CREATININE 2.21*   < > 1.48* 1.56* 1.44*  CALCIUM 8.6*   < > 8.8* 9.1 9.0  PROT 7.2  --   --   --   --   ALBUMIN 3.4*  --   --   --   --   AST 20  --   --   --   --   ALT 22  --   --   --   --  ALKPHOS 102  --   --   --   --   BILITOT 1.1  --   --   --   --   GFRNONAA 34*   < > 55* 51* 57*  ANIONGAP 9   < > 8 7 8    < > = values in this interval not displayed.     Hematology Recent Labs  Lab 12/25/20 0725 12/28/20 0428 12/29/20 0608  WBC 6.6 6.1 5.9  RBC 4.10* 4.04* 4.23  HGB 10.4* 10.2* 10.9*  HCT 32.0* 32.1* 32.9*  MCV 78.0* 79.5* 77.8*  MCH 25.4* 25.2* 25.8*  MCHC 32.5 31.8 33.1  RDW 17.1* 17.6* 18.1*  PLT 271 303 307    BNP Recent Labs  Lab 12/25/20 0725 12/29/20 0608  BNP 2,743.6* 1,290.7*     DDimer No results for input(s): DDIMER in the last 168 hours.   Radiology    No results found.  Cardiac Studies   2D echo 12/14/2020: 1. Left ventricular ejection fraction, by estimation, is <20%. The left  ventricle has severely decreased function. The left ventricle demonstrates  global hypokinesis. The left ventricular internal cavity size was severely  dilated. Left ventricular  diastolic parameters are indeterminate.  2. Right ventricular systolic function is moderately reduced. The right  ventricular size is mildly enlarged. There is normal pulmonary artery  systolic pressure.  3. The mitral valve is normal in structure. Mild mitral valve   Regurgitation. __________  2D echo 09/2020: 1. Left ventricular ejection fraction, by estimation, is approximately  20%. The left ventricle has severely decreased function. The left  ventricle demonstrates global hypokinesis with regional variation as  noted. The left ventricular internal cavity  size was moderately dilated. Left ventricular diastolic parameters are  consistent with Grade III diastolic dysfunction (restrictive). No obvious  mural thrombus noted with Definity contrast.  2. Right ventricular systolic function is moderately to severely reduced.  The right ventricular size is normal. There is moderately elevated  pulmonary artery systolic pressure. The estimated right ventricular  systolic pressure is 52.0 mmHg.  3. Left atrial size was mild to moderately dilated.  4. Right atrial size was mild to moderately dilated.  5. The mitral valve is grossly normal. Mild mitral valve regurgitation.  6. The aortic valve is tricuspid. Aortic valve regurgitation is trivial.  7. The inferior vena cava is dilated in size with <50% respiratory  variability, suggesting right atrial pressure of 15 mmHg. __________  [facility] 05/2019: Conclusions: 1. Severe single-vessel coronary artery disease with chronic total occlusion of the proximal RCA. 2. Mild to moderate, non-obstructive coronary artery disease involving the LAD and LCx. 3. Moderately elevated left heart filling pressures. 4. Severely elevated right heart filling pressures. 5. Mild pulmonary hypertension. 6. Normal Fick cardiac output/index.  Recommendations: 1. 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 Patient will be transported back to [facility] after recovery from today's procedure. 2. Continue diuresis and optimization of evidence-based heart failure therapy. I will restart furosemide 40 mg IV BID this afternoon. 3. Secondary prevention of coronary artery disease, including  high-intensity statin therapy and avoidance of cocaine. __________  2D echo 03/2019: 1. The left ventricle has severely reduced systolic function, with an  ejection fraction of 20-25%. The cavity size was moderately dilated. There  is mildly increased left ventricular wall thickness. Left ventricular  diastolic Doppler parameters are  consistent with restrictive filling. Elevated left ventricular  end-diastolic pressure.  2. Diffuse hypokinesis worse in the inferior wall.  3.  The right ventricle has normal systolic function. The cavity was  moderately enlarged. There is no increase in right ventricular wall  thickness.  4. Right atrial size was mildly dilated.  5. Mild thickening of the mitral valve leaflet.  6. The aortic valve is tricuspid. Mild thickening of the aortic valve.  Mild calcification of the aortic valve.  __________  2D echo 09/2018: - Left ventricle: The cavity size was mildly dilated. Wall  thickness was increased in a pattern of mild LVH. Systolic  function was severely reduced. The estimated ejection fraction  was in the range of 20% to 25%. Diffuse hypokinesis. Features are  consistent with a pseudonormal left ventricular filling pattern,  with concomitant abnormal relaxation and increased filling  pressure (grade 2 diastolic dysfunction). Doppler parameters are  consistent with high ventricular filling pressure.  - Aortic valve: There was trivial regurgitation.  - Mitral valve: There was mild regurgitation.  - Left atrium: The atrium was severely dilated.  - Right ventricle: The cavity size was mildly dilated. Systolic  function was mildly reduced.  - Right atrium: The atrium was moderately dilated.  - Pericardium, extracardiac: A trivial pericardial effusion was  identified.   Impressions:   - Severe global reduction in LV systolic function; mild LVH and  LVE; moderate diastolic dysfunction; trace AI; mild MR; biatrial   enlargement; mild RVE with mild RV dysfunction.  Patient Profile     60 y.o. 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 weights does not appear accurate - Coreg and BiDil - Losartan on hold with AKI. Resume as able and plan to transition to [facility] - Consider addition of spiro and SGLT2i as outpatient. - Patient was wondering about going to rehab 2/25 however RedsVest 46% and BNP 1290 and IV diuresis was continued.  - creatinine stable. Continue diuresis. He will need OP ischemic eval.  CAD Elevated troponin - Troponin minimally elevated and flat, not consistent with ACS - No IV heparin - No reported chest pain - continue aspirin, coreg and statin - Planfor ischemic work-up as outpatient  AKI/CKD stage 2 - creatinine stable today  HTN-- variable 140--80s systolic  ? Real but presume so with repeat in mid 90s -    HLD - LDL 111, goal<70 - continue Lipitor  Anemia - stable  Polysubstance abuse - Urine drug screen positive for THC and cocaine. Reports last use 3 weeks ago - cessation discussed, agreeable to rehab  For questions or updates, please contact [facility] Please consult [email or web address] for contact info under        Signed, [facility], PA-C  12/30/2020, 7:43 AM    Seen and examined and discussed the importance of fluid restriction.  \  Would continue IV diuretics until Cr  bumps normally, but with lowish BP and EF, would like to back off today and hopefully BP can increase to allow for uptitration of his BB

## 2020-12-31 DIAGNOSIS — N179 Acute kidney failure, unspecified: Secondary | ICD-10-CM | POA: Diagnosis not present

## 2020-12-31 DIAGNOSIS — E876 Hypokalemia: Secondary | ICD-10-CM | POA: Diagnosis not present

## 2020-12-31 DIAGNOSIS — I10 Essential (primary) hypertension: Secondary | ICD-10-CM | POA: Diagnosis not present

## 2020-12-31 DIAGNOSIS — I5023 Acute on chronic systolic (congestive) heart failure: Secondary | ICD-10-CM | POA: Diagnosis not present

## 2020-12-31 DIAGNOSIS — F1910 Other psychoactive substance abuse, uncomplicated: Secondary | ICD-10-CM | POA: Diagnosis not present

## 2020-12-31 LAB — CBC WITH DIFFERENTIAL/PLATELET
Abs Immature Granulocytes: 0.02 10*3/uL (ref 0.00–0.07)
Basophils Absolute: 0 10*3/uL (ref 0.0–0.1)
Basophils Relative: 1 %
Eosinophils Absolute: 0.1 10*3/uL (ref 0.0–0.5)
Eosinophils Relative: 2 %
HCT: 32.4 % — ABNORMAL LOW (ref 39.0–52.0)
Hemoglobin: 10.9 g/dL — ABNORMAL LOW (ref 13.0–17.0)
Immature Granulocytes: 0 %
Lymphocytes Relative: 19 %
Lymphs Abs: 1.1 10*3/uL (ref 0.7–4.0)
MCH: 25.7 pg — ABNORMAL LOW (ref 26.0–34.0)
MCHC: 33.6 g/dL (ref 30.0–36.0)
MCV: 76.4 fL — ABNORMAL LOW (ref 80.0–100.0)
Monocytes Absolute: 0.8 10*3/uL (ref 0.1–1.0)
Monocytes Relative: 14 %
Neutro Abs: 3.7 10*3/uL (ref 1.7–7.7)
Neutrophils Relative %: 64 %
Platelets: 316 10*3/uL (ref 150–400)
RBC: 4.24 MIL/uL (ref 4.22–5.81)
RDW: 17.7 % — ABNORMAL HIGH (ref 11.5–15.5)
WBC: 5.7 10*3/uL (ref 4.0–10.5)
nRBC: 0 % (ref 0.0–0.2)

## 2020-12-31 LAB — BASIC METABOLIC PANEL
Anion gap: 9 (ref 5–15)
BUN: 45 mg/dL — ABNORMAL HIGH (ref 6–20)
CO2: 25 mmol/L (ref 22–32)
Calcium: 8.7 mg/dL — ABNORMAL LOW (ref 8.9–10.3)
Chloride: 104 mmol/L (ref 98–111)
Creatinine, Ser: 1.42 mg/dL — ABNORMAL HIGH (ref 0.61–1.24)
GFR, Estimated: 58 mL/min — ABNORMAL LOW (ref >60)
Glucose, Bld: 91 mg/dL (ref 70–99)
Potassium: 3.9 mmol/L (ref 3.5–5.1)
Sodium: 138 mmol/L (ref 135–145)

## 2020-12-31 LAB — MAGNESIUM: Magnesium: 2.3 mg/dL (ref 1.7–2.4)

## 2020-12-31 MED ORDER — FUROSEMIDE 40 MG PO TABS
80.0000 mg | ORAL_TABLET | Freq: Two times a day (BID) | ORAL | Status: DC
  Administered 2020-12-31: 80 mg via ORAL
  Filled 2020-12-31 (×2): qty 2

## 2020-12-31 NOTE — Progress Notes (Signed)
PROGRESS NOTE    Dale [name]  CWC:[id number] DOB: 05-Dec-1960 DOA: 12/25/2020 PCP: Vesta Mixer, MD   Chief Complaint.  Shortness of breath. Brief Narrative:  Patient with a history of acute on chronic systolic congestive heart failure coming in with lower extremity edema shortness of breath and orthopnea. Admitted 12/25/2020.Started on IV Lasix. History of hypertension. Cocaine in urine toxicology. Echocardiogramshowed ejection fraction less than 20%.   Assessment & Plan:   Principal Problem:   Acute on chronic systolic CHF (congestive heart failure) (HCC) Active Problems:   Essential hypertension   Obesity (BMI 30-39.9)   Substance abuse (HCC)   Acute kidney injury superimposed on CKD (HCC)   Hypokalemia  #1. Acute on chronic systolic congestive heart failure with anasarca. Patient continue to improve with a significant urine out from from Lasix. Symptomatically feels better. Continue IV Lasix.  2.  Acute kidney injury on chronic kidney disease stage IIIa. Renal function had improved, remains stable on IV Lasix.  3.  Cocaine abuse. Patient will be going to outpatient rehab program after discharge.  #4.  Morbid obesity.   DVT prophylaxis: Lovenox Code Status: Full Family Communication:  Disposition Plan:  .   Status is: Inpatient  Remains inpatient appropriate because:Inpatient level of care appropriate due to severity of illness   Dispo: The patient is from: Home              Anticipated d/c is to: Home              Patient currently is not medically stable to d/c.   Difficult to place patient No        I/O last 3 completed shifts: In: 720 [P.O.:720] Out: 4200 [Urine:4200] Total I/O In: 477 [P.O.:477] Out: 700 [Urine:700]     Consultants:   Cardiology  Procedures: None  Antimicrobials:None  Subjective: Patient feels well today.  He slept better last night, not short of breath.  No cough. No abdominal pain or nausea vomiting. No  fever chills per No dysuria or hematuria.  Objective: Vitals:   12/31/20 0338 12/31/20 0500 12/31/20 0637 12/31/20 0803  BP: 107/81  116/79   Pulse: 88  92 86  Resp: 17  17 18   Temp: 98.5 F (36.9 C)  98.5 F (36.9 C) 98.4 F (36.9 C)  TempSrc: Oral  Oral Oral  SpO2: 99%  99% 97%  Weight:  119.1 kg    Height:        Intake/Output Summary (Last 24 hours) at 12/31/2020 1217 Last data filed at 12/31/2020 0950 Gross per 24 hour  Intake 717 ml  Output 1900 ml  Net -1183 ml   Filed Weights   12/28/20 0616 12/29/20 0343 12/31/20 0500  Weight: 122.8 kg 118.3 kg 119.1 kg    Examination:  General exam: Appears calm and comfortable  Respiratory system: Crackles in the right lower lobe. Respiratory effort normal. Cardiovascular system: S1 & S2 heard, RRR. No JVD, murmurs, rubs, gallops or clicks. 1+ pedal edema. Gastrointestinal system: Abdomen is nondistended, soft and nontender. No organomegaly or masses felt. Normal bowel sounds heard. Central nervous system: Alert and oriented. No focal neurological deficits. Extremities: Symmetric 5 x 5 power. Skin: No rashes, lesions or ulcers Psychiatry: Judgement and insight appear normal. Mood & affect appropriate.     Data Reviewed: I have personally reviewed following labs and imaging studies  CBC: Recent Labs  Lab 12/25/20 0725 12/28/20 0428 12/29/20 0608 12/31/20 0457  WBC 6.6 6.1 5.9 5.7  NEUTROABS 4.8  4.2 4.0 3.7  HGB 10.4* 10.2* 10.9* 10.9*  HCT 32.0* 32.1* 32.9* 32.4*  MCV 78.0* 79.5* 77.8* 76.4*  PLT 271 303 307 316   Basic Metabolic Panel: Recent Labs  Lab 12/27/20 0412 12/28/20 0428 12/29/20 0608 12/30/20 0359 12/31/20 0457  NA 138 137 140 140 138  K 4.1 4.1 4.3 4.3 3.9  CL 104 105 106 106 104  CO2 26 24 27 26 25   GLUCOSE 115* 100* 121* 107* 91  BUN 41* 42* 41* 42* 45*  CREATININE 1.48* 1.48* 1.56* 1.44* 1.42*  CALCIUM 8.8* 8.8* 9.1 9.0 8.7*  MG 2.3 2.2 2.4 2.2 2.3   GFR: Estimated Creatinine  Clearance: 75.3 mL/min (A) (by C-G formula based on SCr of 1.42 mg/dL (H)). Liver Function Tests: Recent Labs  Lab 12/25/20 0725  AST 20  ALT 22  ALKPHOS 102  BILITOT 1.1  PROT 7.2  ALBUMIN 3.4*   No results for input(s): LIPASE, AMYLASE in the last 168 hours. No results for input(s): AMMONIA in the last 168 hours. Coagulation Profile: No results for input(s): INR, PROTIME in the last 168 hours. Cardiac Enzymes: No results for input(s): CKTOTAL, CKMB, CKMBINDEX, TROPONINI in the last 168 hours. BNP (last 3 results) No results for input(s): PROBNP in the last 8760 hours. HbA1C: No results for input(s): HGBA1C in the last 72 hours. CBG: No results for input(s): GLUCAP in the last 168 hours. Lipid Profile: No results for input(s): CHOL, HDL, LDLCALC, TRIG, CHOLHDL, LDLDIRECT in the last 72 hours. Thyroid Function Tests: No results for input(s): TSH, T4TOTAL, FREET4, T3FREE, THYROIDAB in the last 72 hours. Anemia Panel: No results for input(s): VITAMINB12, FOLATE, FERRITIN, TIBC, IRON, RETICCTPCT in the last 72 hours. Sepsis Labs: No results for input(s): PROCALCITON, LATICACIDVEN in the last 168 hours.  Recent Results (from the past 240 hour(s))  Resp Panel by RT-PCR (Flu A&B, Covid) Nasopharyngeal Swab     Status: None   Collection Time: 12/25/20  7:25 AM   Specimen: Nasopharyngeal Swab; Nasopharyngeal(NP) swabs in vial transport medium  Result Value Ref Range Status   SARS Coronavirus 2 by RT PCR NEGATIVE NEGATIVE Final    Comment: (NOTE) SARS-CoV-2 target nucleic acids are NOT DETECTED.  The SARS-CoV-2 RNA is generally detectable in upper respiratory specimens during the acute phase of infection. The lowest concentration of SARS-CoV-2 viral copies this assay can detect is 138 copies/mL. A negative result does not preclude SARS-Cov-2 infection and should not be used as the sole basis for treatment or other patient management decisions. A negative result may occur with   improper specimen collection/handling, submission of specimen other than nasopharyngeal swab, presence of viral mutation(s) within the areas targeted by this assay, and inadequate number of viral copies(<138 copies/mL). A negative result must be combined with clinical observations, patient history, and epidemiological information. The expected result is Negative.  Fact Sheet for Patients:  12/27/20  Fact Sheet for Healthcare Providers:  BloggerCourse.com  This test is no t yet approved or cleared by the [place] FDA and  has been authorized for detection and/or diagnosis of SARS-CoV-2 by FDA under an Emergency Use Authorization (EUA). This EUA will remain  in effect (meaning this test can be used) for the duration of the COVID-19 declaration under Section 564(b)(1) of the Act, 21 U.S.C.section 360bbb-3(b)(1), unless the authorization is terminated  or revoked sooner.       Influenza A by PCR NEGATIVE NEGATIVE Final   Influenza B by PCR NEGATIVE NEGATIVE Final  Comment: (NOTE) The Xpert Xpress SARS-CoV-2/FLU/RSV plus assay is intended as an aid in the diagnosis of influenza from Nasopharyngeal swab specimens and should not be used as a sole basis for treatment. Nasal washings and aspirates are unacceptable for Xpert Xpress SARS-CoV-2/FLU/RSV testing.  Fact Sheet for Patients: BloggerCourse.com  Fact Sheet for Healthcare Providers: SeriousBroker.it  This test is not yet approved or cleared by the [place] FDA and has been authorized for detection and/or diagnosis of SARS-CoV-2 by FDA under an Emergency Use Authorization (EUA). This EUA will remain in effect (meaning this test can be used) for the duration of the COVID-19 declaration under Section 564(b)(1) of the Act, 21 U.S.C. section 360bbb-3(b)(1), unless the authorization is terminated  or revoked.  Performed at [facility], [address] [place], [place] [address]          Radiology Studies: No results found.      Scheduled Meds: . aspirin EC  81 mg Oral Daily  . atorvastatin  40 mg Oral Daily  . carvedilol  6.25 mg Oral BID WC  . enoxaparin (LOVENOX) injection  0.5 mg/kg Subcutaneous Q24H  . furosemide  80 mg Oral BID  . gabapentin  300 mg Oral QHS  . isosorbide-hydrALAZINE  1 tablet Oral TID  . melatonin  5 mg Oral QHS  . pantoprazole  40 mg Oral Daily  . potassium chloride  40 mEq Oral BID  . sodium chloride flush  3 mL Intravenous Q12H   Continuous Infusions: . sodium chloride       LOS: 6 days    Time spent: 26 minutes    Marrion Coy, MD [facility]   To contact the attending provider between 7A-7P or the covering provider during after hours 7P-7A, please log into the web site [email or web address] and access using universal [place] password for that web site. If you do not have the password, please call the hospital operator.  12/31/2020, 12:17 PM

## 2020-12-31 NOTE — Progress Notes (Signed)
Progress Note  Patient Name: Dale [name] Date of Encounter: 12/31/2020  [facility] Cardiologist: Kristeen Miss, MD   Subjective   Much less short of breath. Creatinine stable Hopeful about rehab      Inpatient Medications    Scheduled Meds: . aspirin EC  81 mg Oral Daily  . atorvastatin  40 mg Oral Daily  . carvedilol  6.25 mg Oral BID WC  . enoxaparin (LOVENOX) injection  0.5 mg/kg Subcutaneous Q24H  . furosemide  80 mg Oral BID  . gabapentin  300 mg Oral QHS  . isosorbide-hydrALAZINE  1 tablet Oral TID  . melatonin  5 mg Oral QHS  . pantoprazole  40 mg Oral Daily  . potassium chloride  40 mEq Oral BID  . sodium chloride flush  3 mL Intravenous Q12H   Continuous Infusions: . sodium chloride     PRN Meds: sodium chloride, acetaminophen, ondansetron (ZOFRAN) IV, sodium chloride flush, traZODone   Vital Signs    Vitals:   12/31/20 0338 12/31/20 0500 12/31/20 0637 12/31/20 0803  BP: 107/81  116/79   Pulse: 88  92 86  Resp: 17  17 18   Temp: 98.5 F (36.9 C)  98.5 F (36.9 C) 98.4 F (36.9 C)  TempSrc: Oral  Oral Oral  SpO2: 99%  99% 97%  Weight:  119.1 kg    Height:        Intake/Output Summary (Last 24 hours) at 12/31/2020 1240 Last data filed at 12/31/2020 0950 Gross per 24 hour  Intake 717 ml  Output 1900 ml  Net -1183 ml   Last 3 Weights 12/31/2020 12/29/2020 12/28/2020  Weight (lbs) 262 lb 9.1 oz 260 lb 12.9 oz 270 lb 11.6 oz  Weight (kg) 119.1 kg 118.3 kg 122.8 kg      Telemetry    SR, HR 80s, 6B NSVT - Personally Reviewed  ECG    No new - Personally Reviewed  Physical Exam   Well developed and nourished in no acute distress HENT normal Neck supple   Clear Regular rate and rhythm, no murmurs or gallops Abd-soft with active BS No Clubbing cyanosis edema Skin-warm and dry A & Oriented  Grossly normal sensory and motor function       Labs    High Sensitivity Troponin:   Recent Labs  Lab 12/12/20 2302 12/13/20 0013  12/25/20 0725 12/25/20 0936  TROPONINIHS 30* 29* 30* 29*      Chemistry Recent Labs  Lab 12/25/20 0725 12/26/20 0438 12/29/20 0608 12/30/20 0359 12/31/20 0457  NA 134*   < > 140 140 138  K 3.3*   < > 4.3 4.3 3.9  CL 98   < > 106 106 104  CO2 27   < > 27 26 25   GLUCOSE 150*   < > 121* 107* 91  BUN 39*   < > 41* 42* 45*  CREATININE 2.21*   < > 1.56* 1.44* 1.42*  CALCIUM 8.6*   < > 9.1 9.0 8.7*  PROT 7.2  --   --   --   --   ALBUMIN 3.4*  --   --   --   --   AST 20  --   --   --   --   ALT 22  --   --   --   --   ALKPHOS 102  --   --   --   --   BILITOT 1.1  --   --   --   --  GFRNONAA 34*   < > 51* 57* 58*  ANIONGAP 9   < > 7 8 9    < > = values in this interval not displayed.     Hematology Recent Labs  Lab 12/28/20 0428 12/29/20 0608 12/31/20 0457  WBC 6.1 5.9 5.7  RBC 4.04* 4.23 4.24  HGB 10.2* 10.9* 10.9*  HCT 32.1* 32.9* 32.4*  MCV 79.5* 77.8* 76.4*  MCH 25.2* 25.8* 25.7*  MCHC 31.8 33.1 33.6  RDW 17.6* 18.1* 17.7*  PLT 303 307 316    BNP Recent Labs  Lab 12/25/20 0725 12/29/20 0608  BNP 2,743.6* 1,290.7*     DDimer No results for input(s): DDIMER in the last 168 hours.   Radiology    No results found.  Cardiac Studies   2D echo 12/14/2020: 1. Left ventricular ejection fraction, by estimation, is <20%. The left  ventricle has severely decreased function. The left ventricle demonstrates  global hypokinesis. The left ventricular internal cavity size was severely  dilated. Left ventricular  diastolic parameters are indeterminate.  2. Right ventricular systolic function is moderately reduced. The right  ventricular size is mildly enlarged. There is normal pulmonary artery  systolic pressure.  3. The mitral valve is normal in structure. Mild mitral valve  Regurgitation. __________  2D echo 09/2020: 1. Left ventricular ejection fraction, by estimation, is approximately  20%. The left ventricle has severely decreased function. The left   ventricle demonstrates global hypokinesis with regional variation as  noted. The left ventricular internal cavity  size was moderately dilated. Left ventricular diastolic parameters are  consistent with Grade III diastolic dysfunction (restrictive). No obvious  mural thrombus noted with Definity contrast.  2. Right ventricular systolic function is moderately to severely reduced.  The right ventricular size is normal. There is moderately elevated  pulmonary artery systolic pressure. The estimated right ventricular  systolic pressure is 52.0 mmHg.  3. Left atrial size was mild to moderately dilated.  4. Right atrial size was mild to moderately dilated.  5. The mitral valve is grossly normal. Mild mitral valve regurgitation.  6. The aortic valve is tricuspid. Aortic valve regurgitation is trivial.  7. The inferior vena cava is dilated in size with <50% respiratory  variability, suggesting right atrial pressure of 15 mmHg. __________  [facility] 05/2019: Conclusions: 1. Severe single-vessel coronary artery disease with chronic total occlusion of the proximal RCA. 2. Mild to moderate, non-obstructive coronary artery disease involving the LAD and LCx. 3. Moderately elevated left heart filling pressures. 4. Severely elevated right heart filling pressures. 5. Mild pulmonary hypertension. 6. Normal Fick cardiac output/index.  Recommendations: 1. 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 Patient will be transported back to [facility] after recovery from today's procedure. 2. Continue diuresis and optimization of evidence-based heart failure therapy. I will restart furosemide 40 mg IV BID this afternoon. 3. Secondary prevention of coronary artery disease, including high-intensity statin therapy and avoidance of cocaine. __________  2D echo 03/2019: 1. The left ventricle has severely reduced systolic function, with an  ejection fraction of 20-25%. The  cavity size was moderately dilated. There  is mildly increased left ventricular wall thickness. Left ventricular  diastolic Doppler parameters are  consistent with restrictive filling. Elevated left ventricular  end-diastolic pressure.  2. Diffuse hypokinesis worse in the inferior wall.  3. The right ventricle has normal systolic function. The cavity was  moderately enlarged. There is no increase in right ventricular wall  thickness.  4. Right atrial size was mildly  dilated.  5. Mild thickening of the mitral valve leaflet.  6. The aortic valve is tricuspid. Mild thickening of the aortic valve.  Mild calcification of the aortic valve.  __________  2D echo 09/2018: - Left ventricle: The cavity size was mildly dilated. Wall  thickness was increased in a pattern of mild LVH. Systolic  function was severely reduced. The estimated ejection fraction  was in the range of 20% to 25%. Diffuse hypokinesis. Features are  consistent with a pseudonormal left ventricular filling pattern,  with concomitant abnormal relaxation and increased filling  pressure (grade 2 diastolic dysfunction). Doppler parameters are  consistent with high ventricular filling pressure.  - Aortic valve: There was trivial regurgitation.  - Mitral valve: There was mild regurgitation.  - Left atrium: The atrium was severely dilated.  - Right ventricle: The cavity size was mildly dilated. Systolic  function was mildly reduced.  - Right atrium: The atrium was moderately dilated.  - Pericardium, extracardiac: A trivial pericardial effusion was  identified.   Impressions:   - Severe global reduction in LV systolic function; mild LVH and  LVE; moderate diastolic dysfunction; trace AI; mild MR; biatrial  enlargement; mild RVE with mild RV dysfunction.  Patient Profile     60 y.o. 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 weights does not appear accurate - Coreg and BiDil - Losartan on hold with AKI. Resume as able and plan to transition to [facility] - Consider addition of spiro and SGLT2i as outpatient. - Patient was wondering about going to rehab 2/25 however RedsVest 46% and BNP 1290 and IV diuresis was continued.  - creatinine stable. Continue diuresis. He will need OP ischemic eval.  CAD Elevated troponin - Troponin minimally elevated and flat, not consistent with ACS - No IV heparin - No reported chest pain - continue aspirin, coreg and statin - Planfor ischemic work-up as outpatient  AKI/CKD stage 2 - creatinine stable today  HTN-- variable 140--80s systolic  ? Real but presume so with repeat in mid 90s -    HLD - LDL 111, goal<70 - continue Lipitor  Anemia - stable  Polysubstance abuse - Urine drug screen positive for THC and cocaine. Reports last use 3 weeks ago - cessation discussed, agreeable to rehab  For questions or updates, please contact [facility] Please consult [email or web address] for contact info under    .c    Signed, [facility], MD  12/31/2020, 12:40 PM    Nonischemic cardiomyopathy  Coronary artery disease  Congestive heart failure-acute/chronic-class III  Renal insufficiency  Estimated Creatinine Clearance: 75.3 mL/min (A) (by C-G formula based on SCr of 1.42 mg/dL (H)).  Hypertension  Polysubstance abuse  Anemia    We will transition diuretics  from IV--p.o. Add losartan for his cardiomyopathy We will need blood work at rehab in about 2 weeks to assess for creatinine and potassium Encouraged follow-up following rehab i.e. about 4 weeks  with cardiology for drug up titration for his congestive heart failure and cardiomyopathy

## 2021-01-01 DIAGNOSIS — N179 Acute kidney failure, unspecified: Secondary | ICD-10-CM | POA: Diagnosis not present

## 2021-01-01 DIAGNOSIS — N189 Chronic kidney disease, unspecified: Secondary | ICD-10-CM | POA: Diagnosis not present

## 2021-01-01 DIAGNOSIS — I10 Essential (primary) hypertension: Secondary | ICD-10-CM | POA: Diagnosis not present

## 2021-01-01 DIAGNOSIS — I5023 Acute on chronic systolic (congestive) heart failure: Secondary | ICD-10-CM | POA: Diagnosis not present

## 2021-01-01 LAB — BASIC METABOLIC PANEL
Anion gap: 9 (ref 5–15)
BUN: 40 mg/dL — ABNORMAL HIGH (ref 6–20)
CO2: 24 mmol/L (ref 22–32)
Calcium: 8.8 mg/dL — ABNORMAL LOW (ref 8.9–10.3)
Chloride: 105 mmol/L (ref 98–111)
Creatinine, Ser: 1.43 mg/dL — ABNORMAL HIGH (ref 0.61–1.24)
GFR, Estimated: 57 mL/min — ABNORMAL LOW (ref >60)
Glucose, Bld: 100 mg/dL — ABNORMAL HIGH (ref 70–99)
Potassium: 4 mmol/L (ref 3.5–5.1)
Sodium: 138 mmol/L (ref 135–145)

## 2021-01-01 LAB — MAGNESIUM: Magnesium: 2.4 mg/dL (ref 1.7–2.4)

## 2021-01-01 LAB — BRAIN NATRIURETIC PEPTIDE: B Natriuretic Peptide: 916.9 pg/mL — ABNORMAL HIGH (ref 0.0–100.0)

## 2021-01-01 MED ORDER — METOLAZONE 2.5 MG PO TABS
2.5000 mg | ORAL_TABLET | Freq: Once | ORAL | Status: AC
  Administered 2021-01-01: 2.5 mg via ORAL
  Filled 2021-01-01: qty 1

## 2021-01-01 MED ORDER — SPIRONOLACTONE 25 MG PO TABS
25.0000 mg | ORAL_TABLET | Freq: Two times a day (BID) | ORAL | Status: DC
  Administered 2021-01-01: 25 mg via ORAL

## 2021-01-01 MED ORDER — SPIRONOLACTONE 25 MG PO TABS: 25.0000 mg | ORAL_TABLET | Freq: Two times a day (BID) | ORAL | 0 refills | Status: DC

## 2021-01-01 MED ORDER — TORSEMIDE 20 MG PO TABS
40.0000 mg | ORAL_TABLET | Freq: Two times a day (BID) | ORAL | Status: DC
  Administered 2021-01-01: 40 mg via ORAL

## 2021-01-01 NOTE — TOC Progression Note (Signed)
Transition of Care [facility]) - Progression Note    Patient Details  Name: Xaiver Roskelley MRN: [id number] Date of Birth: 1961-05-06  Transition of Care [facility]) CM/SW Contact  Maree Krabbe, LCSW Phone Number: 01/01/2021, 3:55 PM  Clinical Narrative:   Pt still not medically stable for d/c. CSW will have to re provide the referral to [facility] once pt is medically stable.         Expected Discharge Plan and Services           Expected Discharge Date: 12/27/20                                     Social Determinants of Health (SDOH) Interventions    Readmission Risk Interventions Readmission Risk Prevention Plan 12/15/2020 09/11/2020 03/03/2019  Transportation Screening Complete Complete Complete  PCP or Specialist Appt within 3-5 Days - Complete -  HRI or Home Care Consult - Complete -  Social Work Consult for Recovery Care Planning/Counseling - Complete -  Palliative Care Screening - Complete -  Medication Review Oceanographer) Complete Complete Complete  PCP or Specialist appointment within 3-5 days of discharge Complete - Complete  HRI or Home Care Consult Complete - (No Data)  SW Recovery Care/Counseling Consult Complete - Complete  Palliative Care Screening Not Applicable - Not Applicable  Skilled Nursing Facility Not Applicable - Not Applicable  Some recent data might be hidden

## 2021-01-01 NOTE — Progress Notes (Incomplete)
Pt with run of 2 degree heart block type 2 during the night. In to see pt. EKG done,  Showing 1st degree AV Block. Vitals taken, WNL. Pt A&O.

## 2021-01-01 NOTE — Progress Notes (Signed)
PROGRESS NOTE    Dale [name]  SAY:[id number] DOB: 1960/12/14 DOA: 12/25/2020 PCP: Vesta Mixer, MD   Chief complaint.  Shortness of breath Brief Narrative:  Patient with a history of acute on chronic systolic congestive heart failure coming in with lower extremity edema shortness of breath and orthopnea. Admitted 12/25/2020.Started on IV Lasix. History of hypertension. Cocaine in urine toxicology. Echocardiogramshowed ejection fraction less than 20%.   Assessment & Plan:   Principal Problem:   Acute on chronic systolic CHF (congestive heart failure) (HCC) Active Problems:   Essential hypertension   Obesity (BMI 30-39.9)   Substance abuse (HCC)   Acute kidney injury superimposed on CKD (HCC)   Hypokalemia  #1. Acute on chronic systolic congestive heart failure with anasarca. Ejection fraction less than 20%.  Patient was treated with IV Lasix for the last few days, switch to oral Lasix yesterday with 80 mg twice a day by cardiology. Patient did not diurese well with oral Lasix.  BNP is still elevated. I will add Aldactone 25 mg twice a day.  Also give a dose of metolazone. He probably can be discharged home tomorrow depends on how he is responding to additional diuretics dose. Recheck a BMP and magnesium in the morning.  2.  Acute kidney injury on chronic kidney disease stage IIIa. Renal function has been stable on Lasix.  3.  Cocaine abuse. Rehab program.  4.  Morbid obesity.    DVT prophylaxis: Lovenox Code Status: Full Family Communication:  Disposition Plan:  .   Status is: Inpatient  Remains inpatient appropriate because:Inpatient level of care appropriate due to severity of illness   Dispo: The patient is from: Home              Anticipated d/c is to: Home              Patient currently is not medically stable to d/c.   Difficult to place patient No        I/O last 3 completed shifts: In: 1194 [P.O.:1194] Out: 5000 [Urine:5000] No  intake/output data recorded.     Consultants:   Cardiology  Procedures: None  Antimicrobials: None  Subjective: Patient slept well last night, short of breath improving.  However, he did not diurese well after switch to oral Lasix. Denies any abdominal pain or nausea vomiting. No fever chills per No dysuria or hematuria.  Objective: Vitals:   12/31/20 2313 01/01/21 0210 01/01/21 0316 01/01/21 0716  BP: 114/82 103/74 102/74 102/79  Pulse: 83 80 81 79  Resp:   20 18  Temp:  98 F (36.7 C) 97.8 F (36.6 C) 97.8 F (36.6 C)  TempSrc:  Oral Axillary Oral  SpO2:  97% 98% 99%  Weight:  117.4 kg 117.7 kg   Height:        Intake/Output Summary (Last 24 hours) at 01/01/2021 1212 Last data filed at 01/01/2021 1000 Gross per 24 hour  Intake 717 ml  Output 3100 ml  Net -2383 ml   Filed Weights   12/31/20 0500 01/01/21 0210 01/01/21 0316  Weight: 119.1 kg 117.4 kg 117.7 kg    Examination:  General exam: Appears calm and comfortable  Respiratory system: Clear to auscultation. Respiratory effort normal. Cardiovascular system: S1 & S2 heard, RRR. No JVD, murmurs, rubs, gallops or clicks.  Gastrointestinal system: Abdomen is nondistended, soft and nontender. No organomegaly or masses felt. Normal bowel sounds heard. Central nervous system: Alert and oriented. No focal neurological deficits. Extremities: 1+ leg edema Skin:  No rashes, lesions or ulcers Psychiatry:  Mood & affect appropriate.     Data Reviewed: I have personally reviewed following labs and imaging studies  CBC: Recent Labs  Lab 12/28/20 0428 12/29/20 0608 12/31/20 0457  WBC 6.1 5.9 5.7  NEUTROABS 4.2 4.0 3.7  HGB 10.2* 10.9* 10.9*  HCT 32.1* 32.9* 32.4*  MCV 79.5* 77.8* 76.4*  PLT 303 307 316   Basic Metabolic Panel: Recent Labs  Lab 12/28/20 0428 12/29/20 0608 12/30/20 0359 12/31/20 0457 01/01/21 0337  NA 137 140 140 138 138  K 4.1 4.3 4.3 3.9 4.0  CL 105 106 106 104 105  CO2 24 27 26 25  24   GLUCOSE 100* 121* 107* 91 100*  BUN 42* 41* 42* 45* 40*  CREATININE 1.48* 1.56* 1.44* 1.42* 1.43*  CALCIUM 8.8* 9.1 9.0 8.7* 8.8*  MG 2.2 2.4 2.2 2.3 2.4   GFR: Estimated Creatinine Clearance: 74.4 mL/min (A) (by C-G formula based on SCr of 1.43 mg/dL (H)). Liver Function Tests: No results for input(s): AST, ALT, ALKPHOS, BILITOT, PROT, ALBUMIN in the last 168 hours. No results for input(s): LIPASE, AMYLASE in the last 168 hours. No results for input(s): AMMONIA in the last 168 hours. Coagulation Profile: No results for input(s): INR, PROTIME in the last 168 hours. Cardiac Enzymes: No results for input(s): CKTOTAL, CKMB, CKMBINDEX, TROPONINI in the last 168 hours. BNP (last 3 results) No results for input(s): PROBNP in the last 8760 hours. HbA1C: No results for input(s): HGBA1C in the last 72 hours. CBG: No results for input(s): GLUCAP in the last 168 hours. Lipid Profile: No results for input(s): CHOL, HDL, LDLCALC, TRIG, CHOLHDL, LDLDIRECT in the last 72 hours. Thyroid Function Tests: No results for input(s): TSH, T4TOTAL, FREET4, T3FREE, THYROIDAB in the last 72 hours. Anemia Panel: No results for input(s): VITAMINB12, FOLATE, FERRITIN, TIBC, IRON, RETICCTPCT in the last 72 hours. Sepsis Labs: No results for input(s): PROCALCITON, LATICACIDVEN in the last 168 hours.  Recent Results (from the past 240 hour(s))  Resp Panel by RT-PCR (Flu A&B, Covid) Nasopharyngeal Swab     Status: None   Collection Time: 12/25/20  7:25 AM   Specimen: Nasopharyngeal Swab; Nasopharyngeal(NP) swabs in vial transport medium  Result Value Ref Range Status   SARS Coronavirus 2 by RT PCR NEGATIVE NEGATIVE Final    Comment: (NOTE) SARS-CoV-2 target nucleic acids are NOT DETECTED.  The SARS-CoV-2 RNA is generally detectable in upper respiratory specimens during the acute phase of infection. The lowest concentration of SARS-CoV-2 viral copies this assay can detect is 138 copies/mL. A negative  result does not preclude SARS-Cov-2 infection and should not be used as the sole basis for treatment or other patient management decisions. A negative result may occur with  improper specimen collection/handling, submission of specimen other than nasopharyngeal swab, presence of viral mutation(s) within the areas targeted by this assay, and inadequate number of viral copies(<138 copies/mL). A negative result must be combined with clinical observations, patient history, and epidemiological information. The expected result is Negative.  Fact Sheet for Patients:  12/27/20  Fact Sheet for Healthcare Providers:  BloggerCourse.com  This test is no t yet approved or cleared by the [place] FDA and  has been authorized for detection and/or diagnosis of SARS-CoV-2 by FDA under an Emergency Use Authorization (EUA). This EUA will remain  in effect (meaning this test can be used) for the duration of the COVID-19 declaration under Section 564(b)(1) of the Act, 21 U.S.C.section 360bbb-3(b)(1), unless the authorization  is terminated  or revoked sooner.       Influenza A by PCR NEGATIVE NEGATIVE Final   Influenza B by PCR NEGATIVE NEGATIVE Final    Comment: (NOTE) The Xpert Xpress SARS-CoV-2/FLU/RSV plus assay is intended as an aid in the diagnosis of influenza from Nasopharyngeal swab specimens and should not be used as a sole basis for treatment. Nasal washings and aspirates are unacceptable for Xpert Xpress SARS-CoV-2/FLU/RSV testing.  Fact Sheet for Patients: BloggerCourse.com  Fact Sheet for Healthcare Providers: SeriousBroker.it  This test is not yet approved or cleared by the [place] FDA and has been authorized for detection and/or diagnosis of SARS-CoV-2 by FDA under an Emergency Use Authorization (EUA). This EUA will remain in effect (meaning this test can be used)  for the duration of the COVID-19 declaration under Section 564(b)(1) of the Act, 21 U.S.C. section 360bbb-3(b)(1), unless the authorization is terminated or revoked.  Performed at [facility], [address], [place], [place] [address]          Radiology Studies: No results found.      Scheduled Meds: . aspirin EC  81 mg Oral Daily  . atorvastatin  40 mg Oral Daily  . carvedilol  6.25 mg Oral BID WC  . enoxaparin (LOVENOX) injection  0.5 mg/kg Subcutaneous Q24H  . furosemide  80 mg Oral BID  . gabapentin  300 mg Oral QHS  . isosorbide-hydrALAZINE  1 tablet Oral TID  . melatonin  5 mg Oral QHS  . metolazone  2.5 mg Oral Once  . pantoprazole  40 mg Oral Daily  . sodium chloride flush  3 mL Intravenous Q12H  . spironolactone  25 mg Oral BID   Continuous Infusions: . sodium chloride       LOS: 7 days    Time spent: 28 minutes    Marrion Coy, MD [facility]   To contact the attending provider between 7A-7P or the covering provider during after hours 7P-7A, please log into the web site [email or web address] and access using universal [place] password for that web site. If you do not have the password, please call the hospital operator.  01/01/2021, 12:12 PM

## 2021-01-01 NOTE — Discharge Summary (Signed)
Physician Discharge Summary  Patient ID: Dale [name] MRN: [id number] DOB/AGE: 04-26-1961 60 y.o.  Admit date: 12/25/2020 Discharge date: 01/01/2021  Admission Diagnoses:  Discharge Diagnoses:  Principal Problem:   Acute on chronic systolic CHF (congestive heart failure) (HCC) Active Problems:   Essential hypertension   Obesity (BMI 30-39.9)   Substance abuse (HCC)   Acute kidney injury superimposed on CKD (HCC)   Hypokalemia   Discharged Condition: good  Hospital Course:  Patient with a history of acute on chronic systolic congestive heart failure coming in with lower extremity edema shortness of breath and orthopnea. Admitted 12/25/2020.Started on IV Lasix. History of hypertension. Cocaine in urine toxicology. Echocardiogramshowed ejection fraction less than 20%.  #1. Acute on chronic systolic congestive heart failure with anasarca. Ejection fraction less than 20%.  Patient was treated with IV Lasix for the last few days, switch to oral Lasix yesterday with 80 mg twice a day by cardiology. I also added aldactone to the regimen. Cardiology has cleared patient for discharge. He will follow with heart failure center as well as cardiology as out patient.  2.  Acute kidney injury on chronic kidney disease stage IIIa. Renal function has been stable on Lasix.  3.  Cocaine abuse. Rehab program.  4.  Morbid obesity.    Consults: cardiology  Significant Diagnostic Studies:  1. Left ventricular ejection fraction, by estimation, is <20%. The left ventricle has severely decreased function. The left ventricle demonstrates global hypokinesis. The left ventricular internal cavity size was severely dilated. Left ventricular diastolic parameters are indeterminate. 2. Right ventricular systolic function is moderately reduced. The right ventricular size is mildly enlarged. There is normal pulmonary artery systolic pressure. 3. The mitral valve is normal in structure. Mild mitral  valve regurgitation.  Treatments: IV lasix  Discharge Exam: Blood pressure 98/76, pulse 86, temperature 98.2 F (36.8 C), temperature source Oral, resp. rate 18, height 5\' 11"  (1.803 m), weight 117.7 kg, SpO2 95 %. General appearance: alert and cooperative Resp: clear to auscultation bilaterally Cardio: regular rate and rhythm, S1, S2 normal, no murmur, click, rub or gallop GI: soft, non-tender; bowel sounds normal; no masses,  no organomegaly Extremities: leg edema much improved  Disposition: Discharge disposition: 01-Home or Self Care       Discharge Instructions    Diet - low sodium heart healthy   Complete by: As directed    Fluid restriction <1534ml/day   Diet - low sodium heart healthy   Complete by: As directed    Fluid restriction 1544ml/day   Increase activity slowly   Complete by: As directed    Increase activity slowly   Complete by: As directed      Allergies as of 01/01/2021      Reactions   Hydrocodone Itching      Medication List    STOP taking these medications   potassium chloride SA 20 MEQ tablet Commonly known as: KLOR-CON     TAKE these medications   aspirin 81 MG EC tablet Take 1 tablet (81 mg total) by mouth daily. Swallow whole.   atorvastatin 40 MG tablet Commonly known as: LIPITOR Take 1 tablet (40 mg total) by mouth daily.   carvedilol 6.25 MG tablet Commonly known as: COREG Take 1 tablet (6.25 mg total) by mouth 2 (two) times daily with a meal.   diclofenac sodium 1 % Gel Commonly known as: VOLTAREN Apply 2 g topically 4 (four) times daily as needed (pain).   gabapentin 300 MG capsule Commonly known as: NEURONTIN  Take 1 capsule (300 mg total) by mouth at bedtime.   isosorbide-hydrALAZINE 20-37.5 MG tablet Commonly known as: BIDIL Take 1 tablet by mouth 3 (three) times daily.   losartan 25 MG tablet Commonly known as: COZAAR Take 0.5 tablets (12.5 mg total) by mouth daily.   pantoprazole 40 MG tablet Commonly known as:  PROTONIX Take 1 tablet (40 mg total) by mouth daily.   spironolactone 25 MG tablet Commonly known as: ALDACTONE Take 1 tablet (25 mg total) by mouth 2 (two) times daily.   Torsemide 40 MG Tabs Take 40 mg by mouth 2 (two) times daily.       Follow-up Information    Nahser, Deloris Ping, MD Follow up in 1 week(s).   Specialty: Cardiology Contact information: [address] [place] [address] [phone]        Nahser, Deloris Ping, MD .   Specialty: Cardiology Contact information: [address] [place] [address] [phone]        [facility] In 1 week.   Specialty: Cardiology Contact information: [address] [place] [address] [phone]              Signed: Marrion Coy 01/01/2021, 4:27 PM

## 2021-01-01 NOTE — Progress Notes (Signed)
   Spoke with rounding heart failure team.  Patient has refused his morning oral Lasix, given concerned that he is not going to put out very much fluid on oral diuresis.    Previously on torsemide at home.  Discussed that we can transition him back to torsemide if he is more agreeable to this medication; however, he will remain volume overloaded and likely continue to not put out fluid if he refuses oral diuresis. This was discussed with HF team.  Will discuss with MD.  Signed, Lennon Alstrom, PA-C 01/01/2021, 12:58 PM

## 2021-01-01 NOTE — Progress Notes (Signed)
This RN provided discharge instructions and teaching to the patient. The patient verbalized and demonstrated understanding of the provided instructions. All outstanding questions resolved. L hand PIV removed. Cannula intact. Pt tolerated well.

## 2021-01-01 NOTE — Progress Notes (Addendum)
Progress Note  Patient Name: Dale [name] Date of Encounter: 01/01/2021  PCP:  Vesta Mixer, MD   [facility]  Cardiologist:  Kristeen Miss, MD  Advanced Practice Provider:  No care team member to display Electrophysiologist:  None    Subjective   Pt denies SOB or CP.  RN report of bradycardia and second degree heart block overnight, though not available on telemetry review of alarms.  EKG taken at that time shows 1st degree AVB, IVCD, TWI in lateral leads as seen in prior EKGs.  He denies any dizziness / presyncope. No palpitations.   He reports less urine output on oral lasix, which was the reason he asked for IV lasix.  States he is ready to go home.  Inpatient Medications    Scheduled Meds: . aspirin EC  81 mg Oral Daily  . atorvastatin  40 mg Oral Daily  . carvedilol  6.25 mg Oral BID WC  . enoxaparin (LOVENOX) injection  0.5 mg/kg Subcutaneous Q24H  . furosemide  80 mg Oral BID  . gabapentin  300 mg Oral QHS  . isosorbide-hydrALAZINE  1 tablet Oral TID  . melatonin  5 mg Oral QHS  . pantoprazole  40 mg Oral Daily  . potassium chloride  40 mEq Oral BID  . sodium chloride flush  3 mL Intravenous Q12H   Continuous Infusions: . sodium chloride     PRN Meds: sodium chloride, acetaminophen, ondansetron (ZOFRAN) IV, sodium chloride flush, traZODone   Vital Signs    Vitals:   12/31/20 2313 01/01/21 0210 01/01/21 0316 01/01/21 0716  BP: 114/82 103/74 102/74 102/79  Pulse: 83 80 81 79  Resp:   20 18  Temp:  98 F (36.7 C) 97.8 F (36.6 C) 97.8 F (36.6 C)  TempSrc:  Oral Axillary Oral  SpO2:  97% 98% 99%  Weight:  117.4 kg 117.7 kg   Height:        Intake/Output Summary (Last 24 hours) at 01/01/2021 1029 Last data filed at 01/01/2021 1000 Gross per 24 hour  Intake 717 ml  Output 3100 ml  Net -2383 ml   Last 3 Weights 01/01/2021 01/01/2021 12/31/2020  Weight (lbs) 259 lb 7.7 oz 258 lb 12.8 oz 262 lb 9.1 oz  Weight (kg)  117.7 kg 117.391 kg 119.1 kg      Telemetry    NSR, 1st degree AVB, PVCs, ventricular couplets. No alarms present before 8AM (telemetry must have been discontinued at some point this AM) - Personally Reviewed  ECG    NSR, 1st degree AVB, IVCD, TWI in lateral leads as seen in prior EKGs - Personally Reviewed  Physical Exam   GEN: No acute distress.  Sitting on edge of bed Neck: JVD difficult to assess due to body habitus Cardiac: RRR, no murmurs, rubs, or gallops.  Respiratory: Clear to auscultation bilaterally. GI: Soft, nontender, non-distended  MS: Mild to trace bilateral LEE; No deformity. Neuro:  Nonfocal  Psych: Normal affect   Labs    High Sensitivity Troponin:   Recent Labs  Lab 12/12/20 2302 12/13/20 0013 12/25/20 0725 12/25/20 0936  TROPONINIHS 30* 29* 30* 29*      Cardiac EnzymesNo results for input(s): TROPONINI in the last 168 hours. No results for input(s): TROPIPOC in the last 168 hours.   Chemistry Recent Labs  Lab 12/30/20 0359 12/31/20 0457 01/01/21 0337  NA 140 138 138  K 4.3 3.9 4.0  CL 106 104 105  CO2 26 25  24  GLUCOSE 107* 91 100*  BUN 42* 45* 40*  CREATININE 1.44* 1.42* 1.43*  CALCIUM 9.0 8.7* 8.8*  GFRNONAA 57* 58* 57*  ANIONGAP 8 9 9      Hematology Recent Labs  Lab 12/28/20 0428 12/29/20 0608 12/31/20 0457  WBC 6.1 5.9 5.7  RBC 4.04* 4.23 4.24  HGB 10.2* 10.9* 10.9*  HCT 32.1* 32.9* 32.4*  MCV 79.5* 77.8* 76.4*  MCH 25.2* 25.8* 25.7*  MCHC 31.8 33.1 33.6  RDW 17.6* 18.1* 17.7*  PLT 303 307 316    BNP Recent Labs  Lab 12/29/20 0608 01/01/21 0337  BNP 1,290.7* 916.9*     DDimer No results for input(s): DDIMER in the last 168 hours.   Radiology    No results found.  Cardiac Studies   2D echo 12/14/2020: 1. Left ventricular ejection fraction, by estimation, is <20%. The left  ventricle has severely decreased function. The left ventricle demonstrates  global hypokinesis. The left ventricular internal cavity  size was severely  dilated. Left ventricular  diastolic parameters are indeterminate.  2. Right ventricular systolic function is moderately reduced. The right  ventricular size is mildly enlarged. There is normal pulmonary artery  systolic pressure.  3. The mitral valve is normal in structure. Mild mitral valve  Regurgitation. __________  2D echo 09/2020: 1. Left ventricular ejection fraction, by estimation, is approximately  20%. The left ventricle has severely decreased function. The left  ventricle demonstrates global hypokinesis with regional variation as  noted. The left ventricular internal cavity  size was moderately dilated. Left ventricular diastolic parameters are  consistent with Grade III diastolic dysfunction (restrictive). No obvious  mural thrombus noted with Definity contrast.  2. Right ventricular systolic function is moderately to severely reduced.  The right ventricular size is normal. There is moderately elevated  pulmonary artery systolic pressure. The estimated right ventricular  systolic pressure is 52.0 mmHg.  3. Left atrial size was mild to moderately dilated.  4. Right atrial size was mild to moderately dilated.  5. The mitral valve is grossly normal. Mild mitral valve regurgitation.  6. The aortic valve is tricuspid. Aortic valve regurgitation is trivial.  7. The inferior vena cava is dilated in size with <50% respiratory  variability, suggesting right atrial pressure of 15 mmHg. __________  [facility] 05/2019: Conclusions: 1. Severe single-vessel coronary artery disease with chronic total occlusion of the proximal RCA. 2. Mild to moderate, non-obstructive coronary artery disease involving the LAD and LCx. 3. Moderately elevated left heart filling pressures. 4. Severely elevated right heart filling pressures. 5. Mild pulmonary hypertension. 6. Normal Fick cardiac output/index.  Recommendations: 1. 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 Patient will be transported back to [facility] after recovery from today's procedure. 2. Continue diuresis and optimization of evidence-based heart failure therapy. I will restart furosemide 40 mg IV BID this afternoon. 3. Secondary prevention of coronary artery disease, including high-intensity statin therapy and avoidance of cocaine. __________  2D echo 03/2019: 1. The left ventricle has severely reduced systolic function, with an  ejection fraction of 20-25%. The cavity size was moderately dilated. There  is mildly increased left ventricular wall thickness. Left ventricular  diastolic Doppler parameters are  consistent with restrictive filling. Elevated left ventricular  end-diastolic pressure.  2. Diffuse hypokinesis worse in the inferior wall.  3. The right ventricle has normal systolic function. The cavity was  moderately enlarged. There is no increase in right ventricular wall  thickness.  4. Right atrial size was  mildly dilated.  5. Mild thickening of the mitral valve leaflet.  6. The aortic valve is tricuspid. Mild thickening of the aortic valve.  Mild calcification of the aortic valve.  __________  2D echo 09/2018: - Left ventricle: The cavity size was mildly dilated. Wall  thickness was increased in a pattern of mild LVH. Systolic  function was severely reduced. The estimated ejection fraction  was in the range of 20% to 25%. Diffuse hypokinesis. Features are  consistent with a pseudonormal left ventricular filling pattern,  with concomitant abnormal relaxation and increased filling  pressure (grade 2 diastolic dysfunction). Doppler parameters are  consistent with high ventricular filling pressure.  - Aortic valve: There was trivial regurgitation.  - Mitral valve: There was mild regurgitation.  - Left atrium: The atrium was severely dilated.  - Right ventricle: The cavity size was mildly dilated. Systolic   function was mildly reduced.  - Right atrium: The atrium was moderately dilated.  - Pericardium, extracardiac: A trivial pericardial effusion was  identified.   Impressions:   - Severe global reduction in LV systolic function; mild LVH and  LVE; moderate diastolic dysfunction; trace AI; mild MR; biatrial  enlargement; mild RVE with mild RV dysfunction.   Patient Profile     60 y.o. male with history of CAD, chronic HFrEF, mixed NICM and ICM, polysubstance abuse with EtOH and cocaine, VT, CKDII, anemia, medication nonadherence, HTN, HLD, and seen today for volume overload.   Assessment & Plan    Acute on chronic combined systolic and diastolic CHF NICM and ICM CM --Appears relatively euvolemic on exam, though volume status difficult to assess with consideration of body habitus. ReDS vest 43% and BNP 916.9 and still elevated, which is consistent with overload, though both values significantly improved from previous. Pt now asx and reports breathing well without CP. Does not reduced urine output on lasix. Limited echo EF <20% as above. --Continue oral lasix 80mg  BID.  Considered additional IV lasix x1 today; however, BP soft. Defer IV diuresis for now and continue oral lasix. Will continue to monitor BP. Discussed with MD with further recommendations per MD if needed. Could consider metolazone if needed in the OP setting if urine output not adequate on lasix. Will reassess in clinic. --Monitor I/Os, daily wt.   Net -2.6L yesterday and -15.1L for admission  Wt 119.1kg  117.7kg. --Daily BMET  Renal function and electrolytes stable. --CHF education.   Limit salt, fluids.   Recommend OP HF clinic. --Continue Coreg, BiDil. Losartan held with AKI. Continue to hold losartan given soft BP today. Continue oral lasix - defer IV lasix given current soft BP. Could consider metolazone in OP setting. Once room in BP, and likely as an OP, recommend transition from [facility] to [facility] if  Cr/K allow or as OP. Consider also addition of Farxiga and spironolactone in outpatient setting. Close OP follow-up and labs.  CAD Supply demand ischemia --No report of CP. --Tn minimally elevated, flat trending 29, 30.  --EKG without acute ST/T changes. --Not consistent with ACS. --Likely supply demand ischemia. --No indication for IV heparin. --No further ischemic workup this admission. --Continue ASA, BB, statin.  Hypertension --BP currently soft. Continue current medications. Escalation of GDMT once BP allows as detailed above.  CKD --Daily BMET with diuresis.  Anemia --Daily CBC  Polysubstance use --UDS positive for THC, cocaine.  --Cessation recommended.   For questions or updates, please contact [facility] Please consult [email or web address] for contact info under  Signed, Lennon Alstrom, PA-C  01/01/2021, 10:29 AM

## 2021-01-01 NOTE — Progress Notes (Signed)
   Heart Failure Nurse Navigator Note  HFrEf<20%.  Moderately decreased right ventricular systolic function.  Mild mitral regurgitation by echocardiogram performed on December 14, 2020.  He presented with increasing shortness of breath for the last several days along with lower extremity edema, chest x-ray revealed pulmonary congestion that was mild.  He had been discharged from the hospital February 13 and was felt to be in volume overload but the patient insisted on being discharged and did not take his diuretic as directed.  Comorbidities:  Hypertension Hyperlipidemia Chronic kidney disease Anemia Morbid obesity Coronary artery disease  He has a known history of substance abuse, cocaine, urinalysis on admission was positive for cocaine and cannabis.   Medications:  Aspirin 81 mg daily Atorvastatin 40 mg daily Coreg 6.25 mg 2 times a day with meals Furosemide 80 mg mg 2 times a day Isosorbide/hydralazine 37.5/21 tablet 3 times a day Potassium chloride 40 mEq daily  Labs:  Sodium 138, potassium 4, chloride 105, CO2 24, BUN 40 down from 45 of yesterday, creatinine 1.43 yesterday's 1.42, magnesium 2.4, BNP 916.  Reds vest reading 43%. Intake 1194 Output 3800 mL Weight 117.7 yesterday's weight was 119.1 kg Blood pressure 102/79   Assessment:  General-he is awake and alert sitting up in the chair at bedside.  HEENT-pupils are equal and reactive to light, no APD noted.  Cardiac-heart tones of regular rate and rhythm.  Chest-breath sounds are clear to posterior auscultation.  Abdomen-rounded soft nontender.  Musculoskeletal there is no lower extremity edema noted.  Neurologic-speech is clear, moves all extremities without difficulty.  Psych-is pleasant and appropriate makes good eye contact.   Again reinforced how he is going to take care of himself by teach back method when he is discharged.  Also stressed the importance of wearing his Ted socks.  Stressed the  importance of weighing daily and reporting weight gain.  Also discussed symptoms to report to physician.  He is awaiting discharge to be placed in rehab.  Tresa Endo RN [facility]

## 2021-01-02 ENCOUNTER — Telehealth: Payer: Self-pay | Admitting: *Deleted

## 2021-01-02 NOTE — Telephone Encounter (Signed)
Transition Care Management Unsuccessful Follow-up Telephone Call  Date of discharge and from where:  01-01-2021 [facility]  Attempts:  1   Reason for unsuccessful TCM follow-up call:  No answer

## 2021-01-03 ENCOUNTER — Telehealth: Payer: Self-pay | Admitting: *Deleted

## 2021-01-03 NOTE — Progress Notes (Signed)
   12/16/20 1151  Assess: MEWS Score  Temp 97.6 F (36.4 C)  BP 93/69  Pulse Rate 88  Resp (!) 24  Level of Consciousness Alert  SpO2 97 %  Assess: MEWS Score  MEWS Temp 0  MEWS Systolic 1  MEWS Pulse 0  MEWS RR 1  MEWS LOC 0  MEWS Score 2  MEWS Score Color Yellow  Assess: if the MEWS score is Yellow or Red  Were vital signs taken at a resting state? Yes  Focused Assessment No change from prior assessment  Early Detection of Sepsis Score *See Row Information* Low  MEWS guidelines implemented *See Row Information* Yes  Treat  MEWS Interventions Other (Comment) (MD notified)  Escalate  MEWS: Escalate Yellow: discuss with charge nurse/RN and consider discussing with provider and RRT  Notify: Charge Nurse/RN  Date Charge Nurse/RN Notified 01/03/21  Notify: Provider  Provider Name/Title Nelson Chimes, Attending MD  Date Provider Notified 01/03/21  Notification Type Call  Notification Reason Other (Comment) (Yellow mews, increased respirations)  Provider response See new orders (ordered lasix earlier today)  Date of Provider Response 01/03/21  Document  Patient Outcome Stabilized after interventions

## 2021-01-03 NOTE — Telephone Encounter (Signed)
Transition Care Management Unsuccessful Follow-up Telephone Call  Date of discharge and from where: [facility]   Attempts:  2 attempt  Reason for unsuccessful TCM follow-up call:  No answer

## 2021-01-04 ENCOUNTER — Telehealth: Payer: Self-pay | Admitting: *Deleted

## 2021-01-04 NOTE — Telephone Encounter (Signed)
Transition Care Management Unsuccessful Follow-up Telephone Call  Date of discharge and from where:  [facility]  Attempts:  3rd Attempt  Reason for unsuccessful TCM follow-up call:  No answer/busy

## 2021-01-05 ENCOUNTER — Ambulatory Visit: Payer: Medicaid Other | Admitting: Family

## 2021-01-09 ENCOUNTER — Telehealth (HOSPITAL_COMMUNITY): Payer: Self-pay | Admitting: Licensed Clinical Social Worker

## 2021-01-09 NOTE — Telephone Encounter (Signed)
CSW received call from patient stating he was accepted to [facility] and in need of transport. CSW contacted [employer] and made arrangements for transport tomorrow morning. Patient grateful for the support. [name], LCSW, CCSW-MCS [phone]

## 2021-01-13 NOTE — Progress Notes (Signed)
Patient ID: Dale [name], male    DOB: Sep 30, 1961, 60 y.o.   MRN: [id number]  HPI  Mr Axel is a 60 y/o male with a history of HTN, cocaine and alcohol use and chronic heart failure.   Echo report from 12/14/20 reviewed and showed an EF of <20% along with mild MR.   LHC/ RHC done 05/10/2019 showed: 1. Severe single-vessel coronary artery disease with chronic total occlusion of the proximal RCA. 2. Mild to moderate, non-obstructive coronary artery disease involving the LAD and LCx. 3. Moderately elevated left heart filling pressures. 4. Severely elevated right heart filling pressures. 5. Mild pulmonary hypertension. 6. Normal Fick cardiac output/index.  Admitted 12/25/20 due to acute on chronic HF. Initially given IV lasix with transition to oral diuretics. Cardiology consult obtained. + for cocaine. Discharged after 7 days. Admitted 12/12/20 due to shortness of breath and elevated troponin. Cardiology consult obtained. Covid negative. Initially given IV lasix with transition to oral diuretics. Discharged after 5 days.   He presents today for an initial visit with a chief complaint of minimal shortness of breath upon moderate exertion. He describes this as having been present for several months although feels like it has improved since recent admission. He has associated fatigue, pedal edema (improves overnight), left leg pain, anxiety and difficulty sleeping along with this. He denies any dizziness, abdominal distention, palpitations, chest pain or cough.   Is unclear when asked about weighing but says that he's going to get his scales picked up today (he is currently at substance abuse rehab). Says that he's not adding any salt to his food and that he's been cooking for the people at the rehab. Says that he made homemade sausage gravy this morning for everyone along with homemade biscuits.   Had received 1 covid vaccine and says that he got "really sick" from it so didn't get another one.    Past Medical History:  Diagnosis Date  . Alcohol abuse   . Chronic combined systolic and diastolic CHF (congestive heart failure) (HCC)    a. 09/27/18 showed mild LVH, EF 20-25%, grade 2 DD, mild MR, severely dilated LA, mildly dilated RV with mildly reduced RV function, mod RAE.  Marland Kitchen Cocaine abuse (HCC)   . Hypertension    Past Surgical History:  Procedure Laterality Date  . RIGHT/LEFT HEART CATH AND CORONARY ANGIOGRAPHY N/A 05/10/2019   Procedure: RIGHT/LEFT HEART CATH AND CORONARY ANGIOGRAPHY;  Surgeon: Yvonne Kendall, MD;  Location: [facility];  Service: Cardiovascular;  Laterality: N/A;   Family History  Problem Relation Age of Onset  . Hypertension Maternal Grandmother    Social History   Tobacco Use  . Smoking status: Never Smoker  . Smokeless tobacco: Never Used  Substance Use Topics  . Alcohol use: Yes    Comment: 2 quarts a week    Allergies  Allergen Reactions  . Hydrocodone Itching   Prior to Admission medications   Medication Sig Start Date End Date Taking? Authorizing Provider  aspirin EC 81 MG EC tablet Take 1 tablet (81 mg total) by mouth daily. Swallow whole. 12/17/20  Yes Arnetha Courser, MD  atorvastatin (LIPITOR) 40 MG tablet Take 1 tablet (40 mg total) by mouth daily. 12/17/20 01/16/21 Yes Arnetha Courser, MD  carvedilol (COREG) 6.25 MG tablet Take 1 tablet (6.25 mg total) by mouth 2 (two) times daily with a meal. 12/17/20 01/16/21 Yes Arnetha Courser, MD  gabapentin (NEURONTIN) 300 MG capsule Take 1 capsule (300 mg total) by mouth  at bedtime. 10/21/20  Yes Noralee Stain, DO  isosorbide-hydrALAZINE (BIDIL) 20-37.5 MG tablet Take 1 tablet by mouth 3 (three) times daily. 12/17/20  Yes Arnetha Courser, MD  losartan (COZAAR) 25 MG tablet Take 0.5 tablets (12.5 mg total) by mouth daily. 12/17/20  Yes Arnetha Courser, MD  spironolactone (ALDACTONE) 25 MG tablet Take 1 tablet (25 mg total) by mouth 2 (two) times daily. 01/01/21  Yes Marrion Coy, MD  torsemide 40 MG TABS  Take 40 mg by mouth 2 (two) times daily. 12/17/20  Yes Arnetha Courser, MD  diclofenac sodium (VOLTAREN) 1 % GEL Apply 2 g topically 4 (four) times daily as needed (pain). Patient not taking: Reported on 01/15/2021    [provider]  pantoprazole (PROTONIX) 40 MG tablet Take 1 tablet (40 mg total) by mouth daily. Patient not taking: Reported on 01/15/2021 10/21/20   Noralee Stain, DO    Review of Systems  Constitutional: Positive for fatigue. Negative for appetite change.  HENT: Negative for congestion and sore throat.   Eyes: Negative.   Respiratory: Positive for shortness of breath. Negative for cough.   Cardiovascular: Positive for leg swelling (resolves on overnight). Negative for chest pain and palpitations.  Gastrointestinal: Negative for abdominal distention and abdominal pain.  Endocrine: Negative.   Genitourinary: Negative.   Musculoskeletal: Positive for arthralgias (left upper leg). Negative for back pain and neck pain.  Skin: Negative.   Allergic/Immunologic: Negative.   Neurological: Negative for dizziness and light-headedness.  Hematological: Negative for adenopathy. Does not bruise/bleed easily.  Psychiatric/Behavioral: Positive for sleep disturbance (sleeping on 1 pillow). Negative for dysphoric mood. The patient is nervous/anxious.    Vitals:   01/15/21 1054  BP: 101/70  Pulse: 79  Resp: 20  SpO2: 100%  Weight: 258 lb (117 kg)  Height: 5\' 11"  (1.803 m)   Wt Readings from Last 3 Encounters:  01/15/21 258 lb (117 kg)  01/01/21 259 lb 7.7 oz (117.7 kg)  12/16/20 269 lb (122 kg)   Lab Results  Component Value Date   CREATININE 1.43 (H) 01/01/2021   CREATININE 1.42 (H) 12/31/2020   CREATININE 1.44 (H) 12/30/2020    Physical Exam Vitals and nursing note reviewed.  Constitutional:      Appearance: He is well-developed.  HENT:     Head: Normocephalic and atraumatic.  Cardiovascular:     Rate and Rhythm: Normal rate and regular rhythm.  Pulmonary:      Effort: Pulmonary effort is normal. No respiratory distress.     Breath sounds: No rhonchi or rales.  Musculoskeletal:     Cervical back: Normal range of motion and neck supple.     Right lower leg: No tenderness. Edema (1+ pitting) present.     Left lower leg: No tenderness. Edema (1+ pitting) present.  Skin:    General: Skin is warm and dry.  Neurological:     General: No focal deficit present.     Mental Status: He is alert and oriented to person, place, and time.  Psychiatric:        Mood and Affect: Mood normal.        Behavior: Behavior normal.    Assessment & Plan:  1: Chronic heart failure with reduced ejection fraction- - NYHA class II - euvolemic today - unclear whether he's weighing daily as he says that he's getting his scales today; instructed him to call for an overnight weight gain of > 2 pounds or a weekly weight gain of > 5 pounds - not  adding salt to his food and says that he's doing a lot of the cooking where he's currently living at; says that he made homemade sausage gravy and biscuits; reviewed sodium content and he's aware to try and keep his daily sodium intake to 2000mg  - understands to keep his daily fluid intake to between 50-60 ounces/ day - saw cardiology Shari Prows) 11/16/20; sees Nahser 01/22/21 - currently on GDMT of carvedilol, losartan, bidil and spironolactone; BP will not allow for changing losartan to entresto - could consider adding SGLT and then decreasing his torsemide - BNP 01/01/21 was 916.9 - received one covid vaccine and says that he got very sick from it - check BMP today  2: HTN- - BP looks good today - BMP 01/01/21 reviewed and showed sodium 138, potassium 4.0, creatinine 1.43 and GFR 57  3: Substance abuse- - last used cocaine little over a week ago - currently at [facility] for his substance abuse; says that he's been clean since 01/10/21  4: Lymphedema- - stage 2 - says that his swelling resolves overnight but then quickly  returns during the day - had compression socks during admission but says that he can't find them; order written for facility to get him compression socks so that he can put them on every morning with removal at bedtime - can elevate his legs when sitting for long periods of time.    Patient did not bring his medications nor a list. Each medication was verbally reviewed with the patient and he was encouraged to bring the bottles to every visit to confirm accuracy of list.

## 2021-01-15 ENCOUNTER — Other Ambulatory Visit
Admission: RE | Admit: 2021-01-15 | Discharge: 2021-01-15 | Disposition: A | Discharge status: Home / Self Care | Payer: Medicaid Other | Source: Ambulatory Visit | Attending: Family | Admitting: Family

## 2021-01-15 ENCOUNTER — Encounter: Payer: Self-pay | Admitting: Family

## 2021-01-15 ENCOUNTER — Ambulatory Visit: Payer: Medicaid Other | Admitting: Family

## 2021-01-15 ENCOUNTER — Other Ambulatory Visit: Payer: Self-pay

## 2021-01-15 VITALS — BP 101/70 | HR 79 | Resp 20 | Ht 71.0 in | Wt 258.0 lb

## 2021-01-15 DIAGNOSIS — F1410 Cocaine abuse, uncomplicated: Secondary | ICD-10-CM | POA: Insufficient documentation

## 2021-01-15 DIAGNOSIS — I10 Essential (primary) hypertension: Secondary | ICD-10-CM

## 2021-01-15 DIAGNOSIS — Z885 Allergy status to narcotic agent status: Secondary | ICD-10-CM | POA: Insufficient documentation

## 2021-01-15 DIAGNOSIS — Z79899 Other long term (current) drug therapy: Secondary | ICD-10-CM | POA: Insufficient documentation

## 2021-01-15 DIAGNOSIS — I890 Lymphedema, not elsewhere classified: Secondary | ICD-10-CM | POA: Insufficient documentation

## 2021-01-15 DIAGNOSIS — I272 Pulmonary hypertension, unspecified: Secondary | ICD-10-CM | POA: Insufficient documentation

## 2021-01-15 DIAGNOSIS — I110 Hypertensive heart disease with heart failure: Secondary | ICD-10-CM | POA: Insufficient documentation

## 2021-01-15 DIAGNOSIS — I5042 Chronic combined systolic (congestive) and diastolic (congestive) heart failure: Secondary | ICD-10-CM | POA: Insufficient documentation

## 2021-01-15 DIAGNOSIS — Z7982 Long term (current) use of aspirin: Secondary | ICD-10-CM | POA: Insufficient documentation

## 2021-01-15 DIAGNOSIS — I2510 Atherosclerotic heart disease of native coronary artery without angina pectoris: Secondary | ICD-10-CM | POA: Insufficient documentation

## 2021-01-15 DIAGNOSIS — F1910 Other psychoactive substance abuse, uncomplicated: Secondary | ICD-10-CM

## 2021-01-15 LAB — BASIC METABOLIC PANEL
Anion gap: 9 (ref 5–15)
BUN: 42 mg/dL — ABNORMAL HIGH (ref 6–20)
CO2: 25 mmol/L (ref 22–32)
Calcium: 9.2 mg/dL (ref 8.9–10.3)
Chloride: 107 mmol/L (ref 98–111)
Creatinine, Ser: 1.7 mg/dL — ABNORMAL HIGH (ref 0.61–1.24)
GFR, Estimated: 46 mL/min — ABNORMAL LOW (ref >60)
Glucose, Bld: 101 mg/dL — ABNORMAL HIGH (ref 70–99)
Potassium: 3.9 mmol/L (ref 3.5–5.1)
Sodium: 141 mmol/L (ref 135–145)

## 2021-01-15 NOTE — Patient Instructions (Addendum)
Continue weighing daily and call for an overnight weight gain of > 2 pounds or a weekly weight gain of >5 pounds.    Get compression socks and put them on every morning with removal at bedtime 

## 2021-01-16 ENCOUNTER — Telehealth: Payer: Self-pay | Admitting: Cardiovascular Disease

## 2021-01-16 NOTE — Telephone Encounter (Signed)
Received a call from [facility] and they wanted to talk with Dr. Elease Hashimoto or nurse. Please call back

## 2021-01-16 NOTE — Telephone Encounter (Signed)
Dale [name]'s best chance of beating his drug addiction is to stay in the rehab program .   If he continues to use drugs, his likelihood of improving from  a cardiac standpoint is low I fully support completion of his current rehab We will reschedule for mid - late April If he needs cardiac meds refilled before that appointment, we will go ahead and fill a 30 day supply to make sure he does not run out of medications during this time .   Kristeen Miss, MD  01/16/2021 6:58 PM    [facility] [address] [place],  [address] [place], [place]  [address] Phone: [phone]; Fax: [phone]

## 2021-01-16 NOTE — Telephone Encounter (Signed)
RN returned call to [employer] [facility], Materials engineer at [facility] treatment center. Dale [name] states that patient checked into the center on 3/9 following hospital discharge. Patient will complete the 28-day program on April 9th. Patient has upcoming appointment on 3/21 with Dr. Elease Hashimoto, and Dale [name] states that they need a letter in witting faxed to them to verify it is okay for the patient to reschedule his upcoming appointment in order to stay in the program. RN explained Dr. Elease Hashimoto is out of the office until Thursday, but RN would speak to him and we would fax the letter and reschedule the appointment. Dale [name] verbalized understanding and thanked Charity fundraiser for call. RN will route to Dr. [facility] fax #-[phone] Attn:Dale [name], Clinical Supervisor.

## 2021-01-17 NOTE — Telephone Encounter (Signed)
Letter faxed to [employer], Materials engineer at [facility].

## 2021-01-22 ENCOUNTER — Ambulatory Visit: Payer: Medicaid Other | Admitting: Cardiovascular Disease

## 2021-01-30 ENCOUNTER — Telehealth: Payer: Self-pay | Admitting: Cardiovascular Disease

## 2021-01-30 NOTE — Telephone Encounter (Signed)
I tried to call the Dale [name], but Dale [name] does not have a voice mail set up to informed him about Dr. Harvie Bridge decision.

## 2021-01-30 NOTE — Telephone Encounter (Signed)
Pt is requesting a refill on gabapentin. Would Dr. Elease Hashimoto like to refill this medication? Please address

## 2021-01-30 NOTE — Telephone Encounter (Signed)
*  STAT* If patient is at the pharmacy, call can be transferred to refill team.   1. Which medications need to be refilled? (please list name of each medication and dose if known) gabapentin (NEURONTIN) 300 MG capsule  2. Which pharmacy/location (including street and city if local pharmacy) is medication to be sent to?  [facility] - [address] [place], [place] [address]  3. Do they need a 30 day or 90 day supply? 90 day supply

## 2021-02-10 NOTE — Progress Notes (Deleted)
Patient ID: Dale [name], male    DOB: [birth year], 60 y.o.   MRN: [id number]  HPI  Dale [name] is a 60 y/o male with a history of HTN, cocaine and alcohol use and chronic heart failure.   Echo report from 12/14/20 reviewed and showed an EF of <20% along with mild Dale.   LHC/ RHC done 05/10/2019 showed: 1. Severe single-vessel coronary artery disease with chronic total occlusion of the proximal RCA. 2. Mild to moderate, non-obstructive coronary artery disease involving the LAD and LCx. 3. Moderately elevated left heart filling pressures. 4. Severely elevated right heart filling pressures. 5. Mild pulmonary hypertension. 6. Normal Fick cardiac output/index.  Admitted 12/25/20 due to acute on chronic HF. Initially given IV lasix with transition to oral diuretics. Cardiology consult obtained. + for cocaine. Discharged after 7 days. Admitted 12/12/20 due to shortness of breath and elevated troponin. Cardiology consult obtained. Covid negative. Initially given IV lasix with transition to oral diuretics. Discharged after 5 days.   He presents today for a follow-up visit with a chief complaint of   Past Medical History:  Diagnosis Date  . Alcohol abuse   . Chronic combined systolic and diastolic CHF (congestive heart failure) (HCC)    a. 09/27/18 showed mild LVH, EF 20-25%, grade 2 DD, mild Dale, severely dilated LA, mildly dilated RV with mildly reduced RV function, mod RAE.  Marland Kitchen Cocaine abuse (HCC)   . Hypertension    Past Surgical History:  Procedure Laterality Date  . RIGHT/LEFT HEART CATH AND CORONARY ANGIOGRAPHY N/A 05/10/2019   Procedure: RIGHT/LEFT HEART CATH AND CORONARY ANGIOGRAPHY;  Surgeon: Yvonne Kendall, MD;  Location: [facility];  Service: Cardiovascular;  Laterality: N/A;   Family History  Problem Relation Age of Onset  . Hypertension Maternal Grandmother    Social History   Tobacco Use  . Smoking status: Never Smoker  . Smokeless tobacco: Never Used  Substance  Use Topics  . Alcohol use: Yes    Comment: 2 quarts a week    Allergies  Allergen Reactions  . Hydrocodone Itching     Review of Systems  Constitutional: Positive for fatigue. Negative for appetite change.  HENT: Negative for congestion and sore throat.   Eyes: Negative.   Respiratory: Positive for shortness of breath. Negative for cough.   Cardiovascular: Positive for leg swelling (resolves on overnight). Negative for chest pain and palpitations.  Gastrointestinal: Negative for abdominal distention and abdominal pain.  Endocrine: Negative.   Genitourinary: Negative.   Musculoskeletal: Positive for arthralgias (left upper leg). Negative for back pain and neck pain.  Skin: Negative.   Allergic/Immunologic: Negative.   Neurological: Negative for dizziness and light-headedness.  Hematological: Negative for adenopathy. Does not bruise/bleed easily.  Psychiatric/Behavioral: Positive for sleep disturbance (sleeping on 1 pillow). Negative for dysphoric mood. The patient is nervous/anxious.      Physical Exam Vitals and nursing note reviewed.  Constitutional:      Appearance: He is well-developed.  HENT:     Head: Normocephalic and atraumatic.  Cardiovascular:     Rate and Rhythm: Normal rate and regular rhythm.  Pulmonary:     Effort: Pulmonary effort is normal. No respiratory distress.     Breath sounds: No rhonchi or rales.  Musculoskeletal:     Cervical back: Normal range of motion and neck supple.     Right lower leg: No tenderness. Edema (1+ pitting) present.     Left lower leg: No tenderness. Edema (1+ pitting) present.  Skin:    General: Skin is warm and dry.  Neurological:     General: No focal deficit present.     Mental Status: He is alert and oriented to person, place, and time.  Psychiatric:        Mood and Affect: Mood normal.        Behavior: Behavior normal.    Assessment & Plan:  1: Chronic heart failure with reduced ejection fraction- - NYHA class  II - euvolemic today - unclear whether he's weighing daily as he says that he's getting his scales today; instructed him to call for an overnight weight gain of > 2 pounds or a weekly weight gain of > 5 pounds - weight 258 pounds from last visit here 1 month here  - not adding salt to his food and says that he's doing a lot of the cooking where he's currently living at; says that he made homemade sausage gravy and biscuits - understands to keep his daily fluid intake to between 50-60 ounces/ day - saw cardiology Shari Prows) 11/16/20 - currently on GDMT of carvedilol, losartan, bidil and spironolactone; BP will not allow for changing losartan to entresto - could consider adding SGLT and then decreasing his torsemide - BNP 01/01/21 was 916.9 - received one covid vaccine and says that he got very sick from it  2: HTN- - BP - BMP 01/15/21 reviewed and showed sodium 141, potassium 3.9, creatinine 1.7 and GFR 46  3: Substance abuse- - last used cocaine little over a week ago - currently at [facility] for his substance abuse; says that he's been clean since 01/10/21  4: Lymphedema- - stage 2 - says that his swelling resolves overnight but then quickly returns during the day - had compression socks during admission but says that he can't find them; order written for facility to get him compression socks so that he can put them on every morning with removal at bedtime - can elevate his legs when sitting for long periods of time.    Patient did not bring his medications nor a list. Each medication was verbally reviewed with the patient and he was encouraged to bring the bottles to every visit to confirm accuracy of list.

## 2021-02-12 ENCOUNTER — Ambulatory Visit: Payer: Medicaid Other | Admitting: Family

## 2021-02-12 ENCOUNTER — Telehealth: Payer: Self-pay | Admitting: Family

## 2021-02-12 NOTE — Telephone Encounter (Signed)
Patient did not show for his [facility] appointment on 02/12/21. Will attempt to reschedule.  

## 2021-02-19 ENCOUNTER — Ambulatory Visit: Payer: Medicaid Other | Admitting: Cardiovascular Disease

## 2021-05-11 ENCOUNTER — Emergency Department (HOSPITAL_COMMUNITY): Payer: Medicaid Other

## 2021-05-11 ENCOUNTER — Other Ambulatory Visit: Payer: Self-pay

## 2021-05-11 ENCOUNTER — Inpatient Hospital Stay (HOSPITAL_COMMUNITY)
Admission: EM | Admit: 2021-05-11 | Discharge: 2021-05-17 | DRG: 291 | Disposition: A | Discharge status: Home / Self Care | Payer: Medicaid Other | Attending: Internal Medicine | Admitting: Internal Medicine

## 2021-05-11 ENCOUNTER — Encounter (HOSPITAL_COMMUNITY): Payer: Self-pay

## 2021-05-11 DIAGNOSIS — Z885 Allergy status to narcotic agent status: Secondary | ICD-10-CM

## 2021-05-11 DIAGNOSIS — Z720 Tobacco use: Secondary | ICD-10-CM | POA: Diagnosis not present

## 2021-05-11 DIAGNOSIS — E876 Hypokalemia: Secondary | ICD-10-CM | POA: Diagnosis present

## 2021-05-11 DIAGNOSIS — G4700 Insomnia, unspecified: Secondary | ICD-10-CM | POA: Diagnosis present

## 2021-05-11 DIAGNOSIS — N182 Chronic kidney disease, stage 2 (mild): Secondary | ICD-10-CM | POA: Diagnosis not present

## 2021-05-11 DIAGNOSIS — I472 Ventricular tachycardia: Secondary | ICD-10-CM | POA: Diagnosis not present

## 2021-05-11 DIAGNOSIS — N1831 Chronic kidney disease, stage 3a: Secondary | ICD-10-CM | POA: Diagnosis present

## 2021-05-11 DIAGNOSIS — Z515 Encounter for palliative care: Secondary | ICD-10-CM | POA: Diagnosis not present

## 2021-05-11 DIAGNOSIS — F1410 Cocaine abuse, uncomplicated: Secondary | ICD-10-CM | POA: Diagnosis present

## 2021-05-11 DIAGNOSIS — Z7982 Long term (current) use of aspirin: Secondary | ICD-10-CM

## 2021-05-11 DIAGNOSIS — Z20822 Contact with and (suspected) exposure to covid-19: Secondary | ICD-10-CM | POA: Diagnosis present

## 2021-05-11 DIAGNOSIS — G4733 Obstructive sleep apnea (adult) (pediatric): Secondary | ICD-10-CM | POA: Diagnosis present

## 2021-05-11 DIAGNOSIS — E785 Hyperlipidemia, unspecified: Secondary | ICD-10-CM | POA: Diagnosis present

## 2021-05-11 DIAGNOSIS — I428 Other cardiomyopathies: Secondary | ICD-10-CM | POA: Diagnosis present

## 2021-05-11 DIAGNOSIS — Z7189 Other specified counseling: Secondary | ICD-10-CM | POA: Diagnosis not present

## 2021-05-11 DIAGNOSIS — Z9119 Patient's noncompliance with other medical treatment and regimen: Secondary | ICD-10-CM | POA: Diagnosis not present

## 2021-05-11 DIAGNOSIS — Z8249 Family history of ischemic heart disease and other diseases of the circulatory system: Secondary | ICD-10-CM | POA: Diagnosis not present

## 2021-05-11 DIAGNOSIS — I5043 Acute on chronic combined systolic (congestive) and diastolic (congestive) heart failure: Secondary | ICD-10-CM | POA: Diagnosis present

## 2021-05-11 DIAGNOSIS — I2510 Atherosclerotic heart disease of native coronary artery without angina pectoris: Secondary | ICD-10-CM | POA: Diagnosis present

## 2021-05-11 DIAGNOSIS — I5023 Acute on chronic systolic (congestive) heart failure: Secondary | ICD-10-CM | POA: Diagnosis not present

## 2021-05-11 DIAGNOSIS — E1122 Type 2 diabetes mellitus with diabetic chronic kidney disease: Secondary | ICD-10-CM | POA: Diagnosis present

## 2021-05-11 DIAGNOSIS — R200 Anesthesia of skin: Secondary | ICD-10-CM

## 2021-05-11 DIAGNOSIS — Z6836 Body mass index (BMI) 36.0-36.9, adult: Secondary | ICD-10-CM

## 2021-05-11 DIAGNOSIS — J810 Acute pulmonary edema: Secondary | ICD-10-CM | POA: Diagnosis not present

## 2021-05-11 DIAGNOSIS — J9601 Acute respiratory failure with hypoxia: Secondary | ICD-10-CM | POA: Diagnosis present

## 2021-05-11 DIAGNOSIS — Z9114 Patient's other noncompliance with medication regimen: Secondary | ICD-10-CM

## 2021-05-11 DIAGNOSIS — I130 Hypertensive heart and chronic kidney disease with heart failure and stage 1 through stage 4 chronic kidney disease, or unspecified chronic kidney disease: Principal | ICD-10-CM | POA: Diagnosis present

## 2021-05-11 DIAGNOSIS — I509 Heart failure, unspecified: Secondary | ICD-10-CM

## 2021-05-11 DIAGNOSIS — I5021 Acute systolic (congestive) heart failure: Secondary | ICD-10-CM | POA: Diagnosis not present

## 2021-05-11 DIAGNOSIS — E1142 Type 2 diabetes mellitus with diabetic polyneuropathy: Secondary | ICD-10-CM | POA: Diagnosis present

## 2021-05-11 DIAGNOSIS — I10 Essential (primary) hypertension: Secondary | ICD-10-CM | POA: Diagnosis present

## 2021-05-11 DIAGNOSIS — Z79899 Other long term (current) drug therapy: Secondary | ICD-10-CM

## 2021-05-11 DIAGNOSIS — R0602 Shortness of breath: Secondary | ICD-10-CM | POA: Diagnosis present

## 2021-05-11 LAB — CBC WITH DIFFERENTIAL/PLATELET
Abs Immature Granulocytes: 0.03 10*3/uL (ref 0.00–0.07)
Basophils Absolute: 0 10*3/uL (ref 0.0–0.1)
Basophils Relative: 1 %
Eosinophils Absolute: 0.1 10*3/uL (ref 0.0–0.5)
Eosinophils Relative: 1 %
HCT: 41.3 % (ref 39.0–52.0)
Hemoglobin: 13.2 g/dL (ref 13.0–17.0)
Immature Granulocytes: 1 %
Lymphocytes Relative: 15 %
Lymphs Abs: 1 10*3/uL (ref 0.7–4.0)
MCH: 28.2 pg (ref 26.0–34.0)
MCHC: 32 g/dL (ref 30.0–36.0)
MCV: 88.2 fL (ref 80.0–100.0)
Monocytes Absolute: 0.5 10*3/uL (ref 0.1–1.0)
Monocytes Relative: 8 %
Neutro Abs: 4.9 10*3/uL (ref 1.7–7.7)
Neutrophils Relative %: 74 %
Platelets: 296 10*3/uL (ref 150–400)
RBC: 4.68 MIL/uL (ref 4.22–5.81)
RDW: 16.6 % — ABNORMAL HIGH (ref 11.5–15.5)
WBC: 6.6 10*3/uL (ref 4.0–10.5)
nRBC: 0 % (ref 0.0–0.2)

## 2021-05-11 LAB — RAPID URINE DRUG SCREEN, HOSP PERFORMED
Amphetamines: NOT DETECTED
Barbiturates: NOT DETECTED
Benzodiazepines: NOT DETECTED
Cocaine: POSITIVE — AB
Opiates: NOT DETECTED
Tetrahydrocannabinol: POSITIVE — AB

## 2021-05-11 LAB — COMPREHENSIVE METABOLIC PANEL
ALT: 20 U/L (ref 0–44)
AST: 24 U/L (ref 15–41)
Albumin: 3.6 g/dL (ref 3.5–5.0)
Alkaline Phosphatase: 78 U/L (ref 38–126)
Anion gap: 10 (ref 5–15)
BUN: 36 mg/dL — ABNORMAL HIGH (ref 6–20)
CO2: 27 mmol/L (ref 22–32)
Calcium: 8.6 mg/dL — ABNORMAL LOW (ref 8.9–10.3)
Chloride: 102 mmol/L (ref 98–111)
Creatinine, Ser: 1.48 mg/dL — ABNORMAL HIGH (ref 0.61–1.24)
GFR, Estimated: 55 mL/min — ABNORMAL LOW (ref >60)
Glucose, Bld: 107 mg/dL — ABNORMAL HIGH (ref 70–99)
Potassium: 3.2 mmol/L — ABNORMAL LOW (ref 3.5–5.1)
Sodium: 139 mmol/L (ref 135–145)
Total Bilirubin: 1.4 mg/dL — ABNORMAL HIGH (ref 0.3–1.2)
Total Protein: 7.3 g/dL (ref 6.5–8.1)

## 2021-05-11 LAB — TROPONIN I (HIGH SENSITIVITY)
Troponin I (High Sensitivity): 40 ng/L — ABNORMAL HIGH (ref <18)
Troponin I (High Sensitivity): 36 ng/L — ABNORMAL HIGH (ref <18)

## 2021-05-11 LAB — BRAIN NATRIURETIC PEPTIDE: B Natriuretic Peptide: 3761.6 pg/mL — ABNORMAL HIGH (ref 0.0–100.0)

## 2021-05-11 LAB — RESP PANEL BY RT-PCR (FLU A&B, COVID) ARPGX2
Influenza A by PCR: NEGATIVE
Influenza B by PCR: NEGATIVE
SARS Coronavirus 2 by RT PCR: NEGATIVE

## 2021-05-11 LAB — MAGNESIUM: Magnesium: 2.2 mg/dL (ref 1.7–2.4)

## 2021-05-11 MED ORDER — IOHEXOL 350 MG/ML SOLN
75.0000 mL | Freq: Once | INTRAVENOUS | Status: AC | PRN
  Administered 2021-05-11: 75 mL via INTRAVENOUS

## 2021-05-11 MED ORDER — ATORVASTATIN CALCIUM 40 MG PO TABS
40.0000 mg | ORAL_TABLET | Freq: Every day | ORAL | Status: DC
  Administered 2021-05-11 – 2021-05-17 (×7): 40 mg via ORAL
  Filled 2021-05-11 (×7): qty 1

## 2021-05-11 MED ORDER — CARVEDILOL 6.25 MG PO TABS
6.2500 mg | ORAL_TABLET | Freq: Two times a day (BID) | ORAL | Status: DC
  Administered 2021-05-12 – 2021-05-17 (×11): 6.25 mg via ORAL
  Filled 2021-05-11 (×11): qty 1

## 2021-05-11 MED ORDER — SODIUM CHLORIDE (PF) 0.9 % IJ SOLN
INTRAMUSCULAR | Status: AC
  Filled 2021-05-11: qty 50

## 2021-05-11 MED ORDER — POTASSIUM CHLORIDE CRYS ER 20 MEQ PO TBCR
20.0000 meq | EXTENDED_RELEASE_TABLET | Freq: Two times a day (BID) | ORAL | Status: AC
  Administered 2021-05-11 – 2021-05-13 (×4): 20 meq via ORAL
  Filled 2021-05-11 (×4): qty 1

## 2021-05-11 MED ORDER — FERROUS SULFATE 325 (65 FE) MG PO TABS
325.0000 mg | ORAL_TABLET | Freq: Every day | ORAL | Status: DC
  Administered 2021-05-12 – 2021-05-17 (×6): 325 mg via ORAL
  Filled 2021-05-11 (×6): qty 1

## 2021-05-11 MED ORDER — ACETAMINOPHEN 325 MG PO TABS
650.0000 mg | ORAL_TABLET | Freq: Four times a day (QID) | ORAL | Status: DC | PRN
  Administered 2021-05-12 – 2021-05-16 (×3): 650 mg via ORAL
  Filled 2021-05-11 (×3): qty 2

## 2021-05-11 MED ORDER — ENOXAPARIN SODIUM 40 MG/0.4ML IJ SOSY
40.0000 mg | PREFILLED_SYRINGE | INTRAMUSCULAR | Status: DC
  Administered 2021-05-11 – 2021-05-16 (×6): 40 mg via SUBCUTANEOUS
  Filled 2021-05-11 (×6): qty 0.4

## 2021-05-11 MED ORDER — GABAPENTIN 100 MG PO CAPS
100.0000 mg | ORAL_CAPSULE | Freq: Three times a day (TID) | ORAL | Status: DC
  Administered 2021-05-11 – 2021-05-17 (×17): 100 mg via ORAL
  Filled 2021-05-11 (×17): qty 1

## 2021-05-11 MED ORDER — LOSARTAN POTASSIUM 25 MG PO TABS
12.5000 mg | ORAL_TABLET | Freq: Every day | ORAL | Status: DC
  Administered 2021-05-11 – 2021-05-17 (×7): 12.5 mg via ORAL
  Filled 2021-05-11 (×7): qty 1

## 2021-05-11 MED ORDER — ASPIRIN EC 81 MG PO TBEC
81.0000 mg | DELAYED_RELEASE_TABLET | Freq: Every day | ORAL | Status: DC
  Administered 2021-05-11 – 2021-05-17 (×7): 81 mg via ORAL
  Filled 2021-05-11 (×7): qty 1

## 2021-05-11 MED ORDER — PANTOPRAZOLE SODIUM 40 MG PO TBEC
40.0000 mg | DELAYED_RELEASE_TABLET | Freq: Every day | ORAL | Status: DC
  Administered 2021-05-11 – 2021-05-17 (×7): 40 mg via ORAL
  Filled 2021-05-11 (×7): qty 1

## 2021-05-11 MED ORDER — FUROSEMIDE 10 MG/ML IJ SOLN
80.0000 mg | Freq: Once | INTRAMUSCULAR | Status: AC
  Administered 2021-05-11: 80 mg via INTRAVENOUS
  Filled 2021-05-11: qty 8

## 2021-05-11 MED ORDER — FUROSEMIDE 10 MG/ML IJ SOLN
40.0000 mg | Freq: Two times a day (BID) | INTRAMUSCULAR | Status: DC
  Administered 2021-05-12 – 2021-05-17 (×11): 40 mg via INTRAVENOUS
  Filled 2021-05-11 (×11): qty 4

## 2021-05-11 MED ORDER — ACETAMINOPHEN 650 MG RE SUPP: 650.0000 mg | Freq: Four times a day (QID) | RECTAL | Status: DC | PRN

## 2021-05-11 NOTE — H&P (Addendum)
History and Physical    Carrie Readus BPP:[id number] DOB: 09/07/1961 DOA: 05/11/2021  PCP: Vesta Mixer, MD  Patient coming from: Home  Chief Complaint: dyspnea, swelling  HPI: Saksham Hardebeck is a 60 y.o. male with medical history significant of Combined HF, cocaine abuse, HTN. Presenting with dyspnea and orthopnea. He reports that he noted some increase swelling in his legs and hands a little over 2 weeks ago. Around that same time, he began to have increased shortness of breath. He really noticed it at night when he tried to lay down. He increased the number of pillows he used, but then eventually began to sleep sitting in a chair. He became concerned about his symptoms and decided to come to the ED for evaluation.  Of note, he reports cocaine use w/i the last week. He reports that he knows he has heart failure. He reports he's been out of his medications for the last month.   He also noted some left arm pain and numbness. It started about a week ago and has come in waves. He denies any other aggravating or alleviating factors.    ED Course: BNP was elevated. Pulm edema seen on imaging. He was given lasix. MRI brain was negative for acute stroke. TRH was called for admission.   Review of Systems:  Denies CP, palpitations, N/V/D, fevers, chills. Reports swelling, intermittent left arm pain/numbness. Review of systems is otherwise negative for all not mentioned in HPI.   PMHx Past Medical History:  Diagnosis Date   Alcohol abuse    Chronic combined systolic and diastolic CHF (congestive heart failure) (HCC)    a. 09/27/18 showed mild LVH, EF 20-25%, grade 2 DD, mild MR, severely dilated LA, mildly dilated RV with mildly reduced RV function, mod RAE.   Cocaine abuse (HCC)    Hypertension     PSHx Past Surgical History:  Procedure Laterality Date   RIGHT/LEFT HEART CATH AND CORONARY ANGIOGRAPHY N/A 05/10/2019   Procedure: RIGHT/LEFT HEART CATH AND CORONARY ANGIOGRAPHY;   Surgeon: Yvonne Kendall, MD;  Location: [facility];  Service: Cardiovascular;  Laterality: N/A;    SocHx  reports that he has never smoked. He has never used smokeless tobacco. He reports current alcohol use. He reports current drug use. Frequency: 3.00 times per week. Drugs: Marijuana and Cocaine.  Allergies  Allergen Reactions   Hydrocodone Itching    FamHx Family History  Problem Relation Age of Onset   Hypertension Maternal Grandmother     Prior to Admission medications   Medication Sig Start Date End Date Taking? Authorizing Provider  aspirin EC 81 MG EC tablet Take 1 tablet (81 mg total) by mouth daily. Swallow whole. 12/17/20  Yes Arnetha Courser, MD  atorvastatin (LIPITOR) 40 MG tablet Take 1 tablet (40 mg total) by mouth daily. 12/17/20 05/11/21 Yes Arnetha Courser, MD  carvedilol (COREG) 6.25 MG tablet Take 1 tablet (6.25 mg total) by mouth 2 (two) times daily with a meal. 12/17/20 05/11/21 Yes Arnetha Courser, MD  ferrous sulfate 325 (65 FE) MG EC tablet Take 325 mg by mouth daily with breakfast.   Yes [provider]  isosorbide-hydrALAZINE (BIDIL) 20-37.5 MG tablet Take 1 tablet by mouth 3 (three) times daily. 12/17/20  Yes Arnetha Courser, MD  losartan (COZAAR) 25 MG tablet Take 0.5 tablets (12.5 mg total) by mouth daily. 12/17/20  Yes Arnetha Courser, MD  torsemide 40 MG TABS Take 40 mg by mouth 2 (two) times daily. 12/17/20  Yes Amin,  Sumayya, MD  diclofenac sodium (VOLTAREN) 1 % GEL Apply 2 g topically 4 (four) times daily as needed (pain).    [provider]  gabapentin (NEURONTIN) 300 MG capsule Take 1 capsule (300 mg total) by mouth at bedtime. Patient not taking: Reported on 05/11/2021 10/21/20   Noralee Stain, DO  pantoprazole (PROTONIX) 40 MG tablet Take 1 tablet (40 mg total) by mouth daily. Patient not taking: No sig reported 10/21/20   Noralee Stain, DO  spironolactone (ALDACTONE) 25 MG tablet Take 1 tablet (25 mg total) by mouth 2 (two) times  daily. Patient not taking: Reported on 05/11/2021 01/01/21   Marrion Coy, MD    Physical Exam: Vitals:   05/11/21 1230 05/11/21 1231 05/11/21 1245 05/11/21 1300  BP: (!) 137/106   (!) 133/95  Pulse: 94  96 90  Resp: 16   16  Temp:      TempSrc:      SpO2: 96% 93% 96% 97%  Weight:      Height:        General: 60 y.o. male resting in bed in NAD Eyes: PERRL, normal sclera ENMT: Nares patent w/o discharge, orophaynx clear, dentition normal, ears w/o discharge/lesions/ulcers Neck: Supple, trachea midline Cardiovascular: RRR, +S1, S2, no m/g/r, equal pulses throughout Respiratory: slightly decreased at bases, no w/r, soft rhales noted, normal WOB on 2L [place] GI: BS+, NDNT, no masses noted, no organomegaly noted MSK: No c/c; ble edema Skin: No rashes, bruises, ulcerations noted Neuro: A&O x 3, no focal deficits Psyc: Appropriate interaction and affect, calm/cooperative  Labs on Admission: I have personally reviewed following labs and imaging studies  CBC: Recent Labs  Lab 05/11/21 0825  WBC 6.6  NEUTROABS 4.9  HGB 13.2  HCT 41.3  MCV 88.2  PLT 296   Basic Metabolic Panel: Recent Labs  Lab 05/11/21 0825  NA 139  K 3.2*  CL 102  CO2 27  GLUCOSE 107*  BUN 36*  CREATININE 1.48*  CALCIUM 8.6*   GFR: Estimated Creatinine Clearance: 71.7 mL/min (A) (by C-G formula based on SCr of 1.48 mg/dL (H)). Liver Function Tests: Recent Labs  Lab 05/11/21 0825  AST 24  ALT 20  ALKPHOS 78  BILITOT 1.4*  PROT 7.3  ALBUMIN 3.6   No results for input(s): LIPASE, AMYLASE in the last 168 hours. No results for input(s): AMMONIA in the last 168 hours. Coagulation Profile: No results for input(s): INR, PROTIME in the last 168 hours. Cardiac Enzymes: No results for input(s): CKTOTAL, CKMB, CKMBINDEX, TROPONINI in the last 168 hours. BNP (last 3 results) No results for input(s): PROBNP in the last 8760 hours. HbA1C: No results for input(s): HGBA1C in the last 72 hours. CBG: No  results for input(s): GLUCAP in the last 168 hours. Lipid Profile: No results for input(s): CHOL, HDL, LDLCALC, TRIG, CHOLHDL, LDLDIRECT in the last 72 hours. Thyroid Function Tests: No results for input(s): TSH, T4TOTAL, FREET4, T3FREE, THYROIDAB in the last 72 hours. Anemia Panel: No results for input(s): VITAMINB12, FOLATE, FERRITIN, TIBC, IRON, RETICCTPCT in the last 72 hours. Urine analysis:    Component Value Date/Time   COLORURINE STRAW (A) 12/25/2020 1143   APPEARANCEUR CLEAR (A) 12/25/2020 1143   LABSPEC 1.006 12/25/2020 1143   PHURINE 7.0 12/25/2020 1143   GLUCOSEU NEGATIVE 12/25/2020 1143   HGBUR NEGATIVE 12/25/2020 1143   BILIRUBINUR NEGATIVE 12/25/2020 1143   KETONESUR NEGATIVE 12/25/2020 1143   PROTEINUR NEGATIVE 12/25/2020 1143   UROBILINOGEN 0.2 03/11/2016 0934   NITRITE NEGATIVE  12/25/2020 1143   LEUKOCYTESUR NEGATIVE 12/25/2020 1143    Radiological Exams on Admission: DG Chest 2 View  Result Date: 05/11/2021 CLINICAL DATA:  Shortness of breath EXAM: CHEST - 2 VIEW COMPARISON:  12/25/2020 FINDINGS: Cardiomegaly and borderline vascular congestion. No Kerley lines or consolidation. No effusion or pneumothorax. IMPRESSION: Cardiomegaly without edema. Electronically Signed   By: Marnee Spring M.D.   On: 05/11/2021 07:08   CT Angio Chest PE W and/or Wo Contrast  Result Date: 05/11/2021 CLINICAL DATA:  Rule out pulmonary embolus. EXAM: CT ANGIOGRAPHY CHEST WITH CONTRAST TECHNIQUE: Multidetector CT imaging of the chest was performed using the standard protocol during bolus administration of intravenous contrast. Multiplanar CT image reconstructions and MIPs were obtained to evaluate the vascular anatomy. CONTRAST:  64mL OMNIPAQUE IOHEXOL 350 MG/ML SOLN COMPARISON:  09/08/20 FINDINGS: Cardiovascular: Satisfactory opacification of the pulmonary arteries to the segmental level. No evidence of pulmonary embolism. Multi chamber cardiac enlargement is again noted, similar to  previous exam. No pericardial effusion. Mild aortic atherosclerosis and coronary artery calcifications. Mediastinum/Nodes: Normal appearance of the thyroid gland. The trachea appears patent and is midline. Normal appearance of the esophagus. No axillary, supraclavicular pre-vascular lymph node is enlarged measuring 1.9 cm, image 97/5. This is unchanged from previous exam. 2.2 cm subcarinal lymph node is noted, image 130/5. Also unchanged. Lungs/Pleura: No pleural effusion. No airspace consolidation, atelectasis or pneumothorax. Mild diffuse ground-glass attenuation identified. Interlobular septal thickening identified in the right base. 4 mm perifissural right middle lobe lung nodule is identified, unchanged from previous exam. Likely benign intrapulmonary lymph node. Upper Abdomen: No acute abnormality. Reflux of contrast material from the right atrium into the IVC and hepatic veins compatible with passive venous congestion. Musculoskeletal: No chest wall abnormality. No acute or significant osseous findings. Review of the MIP images confirms the above findings. IMPRESSION: 1. No evidence for acute pulmonary embolus. 2. Multi chamber cardiac enlargement, mild pulmonary edema and passive venous congestion compatible with CHF. 3. Aortic atherosclerosis and coronary artery calcifications. 4. Stable enlarged pre-vascular and subcarinal lymph nodes. Etiology is indeterminate but may be reactive in etiology. Aortic Atherosclerosis (ICD10-I70.0). Electronically Signed   By: Signa Kell M.D.   On: 05/11/2021 12:26   MR BRAIN WO CONTRAST  Result Date: 05/11/2021 CLINICAL DATA:  Neuro deficit, acute, stroke suspected. EXAM: MRI HEAD WITHOUT CONTRAST TECHNIQUE: Multiplanar, multiecho pulse sequences of the brain and surrounding structures were obtained without intravenous contrast. COMPARISON:  None. FINDINGS: Brain: There is no evidence of an acute infarct, mass, midline shift, or extra-axial fluid collection. The  ventricles are normal in size. Patchy T2 hyperintensities in the cerebral white matter bilaterally are nonspecific but compatible with moderately age advanced chronic small vessel ischemic disease with some areas of T2 shine through noted on diffusion imaging. There are small cortical infarcts in the right parieto-occipital region and posterior right insula which have a subacute to chronic appearance. There is a small amount of chronic blood products associated with the right parieto-occipital infarct. There is also likely a subtle small chronic cortical infarct in the posterior left frontal lobe with a small amount of chronic blood products. Vascular: Major intracranial vascular flow voids are preserved. Skull and upper cervical spine: No suspicious marrow lesion. Cystic focus in the right petrous apex appears contiguous with Meckel's cave and likely reflects an incidental cephalocele. Sinuses/Orbits: Unremarkable orbits. Small right mastoid effusion. Clear paranasal sinuses. Other: None. IMPRESSION: 1. No acute intracranial abnormality. 2. Moderate chronic small vessel ischemic disease with multiple small  nonacute infarcts as above. Electronically Signed   By: Sebastian Ache M.D.   On: 05/11/2021 11:10    EKG: Independently reviewed. Sinus, no st elevations  Assessment/Plan Acute on chronic combined HF     - admit to inpt, tele     - BNP 3761; trp 40 -> 36;no chest pain, follow     - lasix 40mg  IV BID, fluid restriction to 1200cc     - continue cozaar, coreg, bidil as tolerated     - check echo  HLD     - continue statin  Cocaine abuse     - reports recent coke use     - counsel against further use  Hypokalemia     - replace K+, check Mg2+  HTN     - resume cozaar, coreg, and bidil as BP tolerates  CKD3a     - he is at baseline. Watch nephrotoxins.  Left arm numbness/pain     - MRI brain is negative     - ?neuropathy     - try gabapentin  DVT prophylaxis: lovenox  Code Status: FULL   Family Communication: None at bedside  Consults called: None   Status is: Inpatient  Remains inpatient appropriate because:Inpatient level of care appropriate due to severity of illness  Dispo: The patient is from: Home              Anticipated d/c is to: Home              Patient currently is not medically stable to d/c.   Difficult to place patient No  Time spent coordinating admission: 60 minutes  Khloe Hunkele A Elgie Landino DO [facility]  If 7PM-7AM, please contact night-coverage [email or web address]  05/11/2021, 1:39 PM

## 2021-05-11 NOTE — ED Provider Notes (Signed)
[facility]-EMERGENCY DEPT Provider Note   CSN: [id number] Arrival date & time: 05/11/21  1324     History Chief Complaint  Patient presents with   Shortness of Breath    Dale [name] is a 60 y.o. male.  HPI     60 year old male with a history of chronic combined systolic and diastolic congestive heart failure with an EF of less than 20%, hypertension, alcohol abuse, cocaine abuse, CKD who presents with weeks of shortness of breath in the setting of nonadherence to his medications.  Reports he has been taking his Lasix 40 mg twice daily, but has not been able to afford or take any of his other medications.  Reports progressive shortness of breath over the last several weeks, and increasing leg swelling.  Reports orthopnea.  He does check his weights and reports that they have been up and down.  He has had dry cough over the last week.  Denies chest pain, nausea, vomiting, diarrhea, black or bloody stools, fevers.  Reports 1 week ago he had an episode of near syncope and left-sided numbness.  Reports EMS evaluated him and recommended transfer to the emergency department but he declined.  Reports the symptoms lasted for less than a day and improved, however on his exam today reports persistent numbness of the left upper extremity.  Reports 3 weeks ago he did relapse on cocaine.   Past Medical History:  Diagnosis Date   Alcohol abuse    Chronic combined systolic and diastolic CHF (congestive heart failure) (HCC)    a. 09/27/18 showed mild LVH, EF 20-25%, grade 2 DD, mild MR, severely dilated LA, mildly dilated RV with mildly reduced RV function, mod RAE.   Cocaine abuse (HCC)    Hypertension     Patient Active Problem List   Diagnosis Date Noted   Hypokalemia    Acute on chronic systolic CHF (congestive heart failure) (HCC) 12/25/2020   Coronary artery disease involving native coronary artery of native heart without angina pectoris    Acute kidney injury  superimposed on CKD (HCC) 10/16/2020   Acute on chronic combined systolic and diastolic HF (heart failure) (HCC) 09/08/2020   Acute on chronic combined systolic (congestive) and diastolic (congestive) heart failure (HCC)    Acute CHF (congestive heart failure) (HCC) 05/07/2019   Nausea & vomiting 05/07/2019   Acute exacerbation of CHF (congestive heart failure) (HCC) 03/11/2019   Troponin level elevated 03/11/2019   Hyperlipidemia 03/03/2019   NSVT (nonsustained ventricular tachycardia) (HCC) 03/03/2019   Anxiety and depression    NSTEMI (non-ST elevated myocardial infarction) (HCC) 02/28/2019   Acute systolic heart failure (HCC) 11/08/2018   Hypertensive urgency 09/28/2018   Acute pulmonary edema (HCC) 09/28/2018   Cocaine abuse (HCC) 09/28/2018   Substance abuse (HCC) 09/28/2018   Chronic kidney disease, stage II (mild) 09/28/2018   Normochromic anemia 09/28/2018   Acute systolic CHF (congestive heart failure) (HCC) 09/27/2018   Essential hypertension 03/11/2016   Obesity (BMI 30-39.9) 03/11/2016   Glucosuria 03/11/2016    Past Surgical History:  Procedure Laterality Date   RIGHT/LEFT HEART CATH AND CORONARY ANGIOGRAPHY N/A 05/10/2019   Procedure: RIGHT/LEFT HEART CATH AND CORONARY ANGIOGRAPHY;  Surgeon: Yvonne Kendall, MD;  Location: [facility] INVASIVE CV LAB;  Service: Cardiovascular;  Laterality: N/A;       Family History  Problem Relation Age of Onset   Hypertension Maternal Grandmother     Social History   Tobacco Use   Smoking status: Never   Smokeless  tobacco: Never  Vaping Use   Vaping Use: Never used  Substance Use Topics   Alcohol use: Yes    Comment: 2 quarts a week    Drug use: Yes    Frequency: 3.0 times per week    Types: Marijuana, Cocaine    Home Medications Prior to Admission medications   Medication Sig Start Date End Date Taking? Authorizing Provider  aspirin EC 81 MG EC tablet Take 1 tablet (81 mg total) by mouth daily. Swallow whole. 12/17/20  Yes  Arnetha Courser, MD  atorvastatin (LIPITOR) 40 MG tablet Take 1 tablet (40 mg total) by mouth daily. 12/17/20 05/11/21 Yes Arnetha Courser, MD  carvedilol (COREG) 6.25 MG tablet Take 1 tablet (6.25 mg total) by mouth 2 (two) times daily with a meal. 12/17/20 05/11/21 Yes Arnetha Courser, MD  ferrous sulfate 325 (65 FE) MG EC tablet Take 325 mg by mouth daily with breakfast.   Yes [provider]  isosorbide-hydrALAZINE (BIDIL) 20-37.5 MG tablet Take 1 tablet by mouth 3 (three) times daily. 12/17/20  Yes Arnetha Courser, MD  losartan (COZAAR) 25 MG tablet Take 0.5 tablets (12.5 mg total) by mouth daily. 12/17/20  Yes Arnetha Courser, MD  torsemide 40 MG TABS Take 40 mg by mouth 2 (two) times daily. 12/17/20  Yes Arnetha Courser, MD  diclofenac sodium (VOLTAREN) 1 % GEL Apply 2 g topically 4 (four) times daily as needed (pain).    [provider]  gabapentin (NEURONTIN) 300 MG capsule Take 1 capsule (300 mg total) by mouth at bedtime. Patient not taking: Reported on 05/11/2021 10/21/20   Noralee Stain, DO  pantoprazole (PROTONIX) 40 MG tablet Take 1 tablet (40 mg total) by mouth daily. Patient not taking: No sig reported 10/21/20   Noralee Stain, DO  spironolactone (ALDACTONE) 25 MG tablet Take 1 tablet (25 mg total) by mouth 2 (two) times daily. Patient not taking: Reported on 05/11/2021 01/01/21   Marrion Coy, MD    Allergies    Hydrocodone  Review of Systems   Review of Systems  Constitutional:  Positive for fatigue. Negative for fever.  HENT:  Negative for sore throat.   Eyes:  Negative for visual disturbance.  Respiratory:  Positive for cough and shortness of breath.   Cardiovascular:  Positive for leg swelling. Negative for chest pain.  Gastrointestinal:  Negative for abdominal pain, blood in stool, diarrhea, nausea and vomiting.  Genitourinary:  Negative for difficulty urinating.  Musculoskeletal:  Negative for back pain and neck stiffness.  Skin:  Negative for rash.  Neurological:   Positive for speech difficulty (episode of), light-headedness and numbness. Negative for syncope, facial asymmetry, weakness and headaches.   Physical Exam Updated Vital Signs BP (!) 123/103   Pulse 100   Temp 98 F (36.7 C) (Oral)   Resp 19   Ht 5\' 11"  (1.803 m)   Wt 117 kg   SpO2 99%   BMI 35.98 kg/m   Physical Exam Vitals and nursing note reviewed.  Constitutional:      General: He is not in acute distress.    Appearance: Normal appearance. He is well-developed. He is not ill-appearing or diaphoretic.  HENT:     Head: Normocephalic and atraumatic.  Eyes:     General: No visual field deficit.    Extraocular Movements: Extraocular movements intact.     Conjunctiva/sclera: Conjunctivae normal.     Pupils: Pupils are equal, round, and reactive to light.  Cardiovascular:     Rate and Rhythm: Normal  rate and regular rhythm.     Pulses: Normal pulses.     Heart sounds: Normal heart sounds. No murmur heard.   No friction rub. No gallop.  Pulmonary:     Effort: Pulmonary effort is normal. No respiratory distress.     Breath sounds: Normal breath sounds. No wheezing or rales.  Abdominal:     General: There is no distension.     Palpations: Abdomen is soft.     Tenderness: There is no abdominal tenderness. There is no guarding.  Musculoskeletal:        General: No swelling or tenderness.     Cervical back: Normal range of motion.     Right lower leg: Edema present.     Left lower leg: Edema present.  Skin:    General: Skin is warm and dry.     Findings: No erythema or rash.  Neurological:     General: No focal deficit present.     Mental Status: He is alert and oriented to person, place, and time.     GCS: GCS eye subscore is 4. GCS verbal subscore is 5. GCS motor subscore is 6.     Cranial Nerves: No cranial nerve deficit, dysarthria or facial asymmetry.     Sensory: Sensory deficit (LUE) present.     Motor: No weakness or tremor.     Coordination: Coordination normal.  Finger-Nose-Finger Test normal.    ED Results / Procedures / Treatments   Labs (all labs ordered are listed, but only abnormal results are displayed) Labs Reviewed  CBC WITH DIFFERENTIAL/PLATELET - Abnormal; Notable for the following components:      Result Value   RDW 16.6 (*)    All other components within normal limits  COMPREHENSIVE METABOLIC PANEL - Abnormal; Notable for the following components:   Potassium 3.2 (*)    Glucose, Bld 107 (*)    BUN 36 (*)    Creatinine, Ser 1.48 (*)    Calcium 8.6 (*)    Total Bilirubin 1.4 (*)    GFR, Estimated 55 (*)    All other components within normal limits  BRAIN NATRIURETIC PEPTIDE - Abnormal; Notable for the following components:   B Natriuretic Peptide 3,761.6 (*)    All other components within normal limits  RAPID URINE DRUG SCREEN, HOSP PERFORMED - Abnormal; Notable for the following components:   Cocaine POSITIVE (*)    Tetrahydrocannabinol POSITIVE (*)    All other components within normal limits  TROPONIN I (HIGH SENSITIVITY) - Abnormal; Notable for the following components:   Troponin I (High Sensitivity) 40 (*)    All other components within normal limits  TROPONIN I (HIGH SENSITIVITY) - Abnormal; Notable for the following components:   Troponin I (High Sensitivity) 36 (*)    All other components within normal limits  RESP PANEL BY RT-PCR (FLU A&B, COVID) ARPGX2  MAGNESIUM  HEMOGLOBIN A1C  COMPREHENSIVE METABOLIC PANEL  CBC    EKG EKG Interpretation  Date/Time:  Friday May 11 2021 08:34:06 EDT Ventricular Rate:  98 PR Interval:  195 QRS Duration: 111 QT Interval:  452 QTC Calculation: 578 R Axis:   13 Text Interpretation: Sinus rhythm Ventricular trigeminy LAE, consider biatrial enlargement Incomplete left bundle branch block Prolonged QT interval No significant change since last tracing Confirmed by Alvira Monday ([id number]) on 05/11/2021 8:58:21 AM  Radiology DG Chest 2 View  Result Date: 05/11/2021 CLINICAL  DATA:  Shortness of breath EXAM: CHEST - 2 VIEW COMPARISON:  12/25/2020 FINDINGS:  Cardiomegaly and borderline vascular congestion. No Kerley lines or consolidation. No effusion or pneumothorax. IMPRESSION: Cardiomegaly without edema. Electronically Signed   By: Marnee SpringJonathon  Watts M.D.   On: 05/11/2021 07:08   CT Angio Chest PE W and/or Wo Contrast  Result Date: 05/11/2021 CLINICAL DATA:  Rule out pulmonary embolus. EXAM: CT ANGIOGRAPHY CHEST WITH CONTRAST TECHNIQUE: Multidetector CT imaging of the chest was performed using the standard protocol during bolus administration of intravenous contrast. Multiplanar CT image reconstructions and MIPs were obtained to evaluate the vascular anatomy. CONTRAST:  75mL OMNIPAQUE IOHEXOL 350 MG/ML SOLN COMPARISON:  09/08/20 FINDINGS: Cardiovascular: Satisfactory opacification of the pulmonary arteries to the segmental level. No evidence of pulmonary embolism. Multi chamber cardiac enlargement is again noted, similar to previous exam. No pericardial effusion. Mild aortic atherosclerosis and coronary artery calcifications. Mediastinum/Nodes: Normal appearance of the thyroid gland. The trachea appears patent and is midline. Normal appearance of the esophagus. No axillary, supraclavicular pre-vascular lymph node is enlarged measuring 1.9 cm, image 97/5. This is unchanged from previous exam. 2.2 cm subcarinal lymph node is noted, image 130/5. Also unchanged. Lungs/Pleura: No pleural effusion. No airspace consolidation, atelectasis or pneumothorax. Mild diffuse ground-glass attenuation identified. Interlobular septal thickening identified in the right base. 4 mm perifissural right middle lobe lung nodule is identified, unchanged from previous exam. Likely benign intrapulmonary lymph node. Upper Abdomen: No acute abnormality. Reflux of contrast material from the right atrium into the IVC and hepatic veins compatible with passive venous congestion. Musculoskeletal: No chest wall  abnormality. No acute or significant osseous findings. Review of the MIP images confirms the above findings. IMPRESSION: 1. No evidence for acute pulmonary embolus. 2. Multi chamber cardiac enlargement, mild pulmonary edema and passive venous congestion compatible with CHF. 3. Aortic atherosclerosis and coronary artery calcifications. 4. Stable enlarged pre-vascular and subcarinal lymph nodes. Etiology is indeterminate but may be reactive in etiology. Aortic Atherosclerosis (ICD10-I70.0). Electronically Signed   By: Signa Kellaylor  Stroud M.D.   On: 05/11/2021 12:26   MR BRAIN WO CONTRAST  Result Date: 05/11/2021 CLINICAL DATA:  Neuro deficit, acute, stroke suspected. EXAM: MRI HEAD WITHOUT CONTRAST TECHNIQUE: Multiplanar, multiecho pulse sequences of the brain and surrounding structures were obtained without intravenous contrast. COMPARISON:  None. FINDINGS: Brain: There is no evidence of an acute infarct, mass, midline shift, or extra-axial fluid collection. The ventricles are normal in size. Patchy T2 hyperintensities in the cerebral white matter bilaterally are nonspecific but compatible with moderately age advanced chronic small vessel ischemic disease with some areas of T2 shine through noted on diffusion imaging. There are small cortical infarcts in the right parieto-occipital region and posterior right insula which have a subacute to chronic appearance. There is a small amount of chronic blood products associated with the right parieto-occipital infarct. There is also likely a subtle small chronic cortical infarct in the posterior left frontal lobe with a small amount of chronic blood products. Vascular: Major intracranial vascular flow voids are preserved. Skull and upper cervical spine: No suspicious marrow lesion. Cystic focus in the right petrous apex appears contiguous with Meckel's cave and likely reflects an incidental cephalocele. Sinuses/Orbits: Unremarkable orbits. Small right mastoid effusion. Clear  paranasal sinuses. Other: None. IMPRESSION: 1. No acute intracranial abnormality. 2. Moderate chronic small vessel ischemic disease with multiple small nonacute infarcts as above. Electronically Signed   By: Sebastian AcheAllen  Grady M.D.   On: 05/11/2021 11:10    Procedures Procedures   Medications Ordered in ED Medications  atorvastatin (LIPITOR) tablet 40 mg (40 mg  Oral Given 05/11/21 2035)  carvedilol (COREG) tablet 6.25 mg (has no administration in time range)  losartan (COZAAR) tablet 12.5 mg (12.5 mg Oral Given 05/11/21 2035)  pantoprazole (PROTONIX) EC tablet 40 mg (40 mg Oral Given 05/11/21 2036)  ferrous sulfate tablet 325 mg (has no administration in time range)  enoxaparin (LOVENOX) injection 40 mg (40 mg Subcutaneous Given 05/11/21 2034)  furosemide (LASIX) injection 40 mg (has no administration in time range)  acetaminophen (TYLENOL) tablet 650 mg (has no administration in time range)    Or  acetaminophen (TYLENOL) suppository 650 mg (has no administration in time range)  potassium chloride SA (KLOR-CON) CR tablet 20 mEq (20 mEq Oral Given 05/11/21 2035)  gabapentin (NEURONTIN) capsule 100 mg (100 mg Oral Given 05/11/21 2035)  aspirin EC tablet 81 mg (81 mg Oral Given 05/11/21 2036)  iohexol (OMNIPAQUE) 350 MG/ML injection 75 mL (75 mLs Intravenous Contrast Given 05/11/21 1137)  sodium chloride (PF) 0.9 % injection (  Given by Other 05/11/21 1155)  furosemide (LASIX) injection 80 mg (80 mg Intravenous Given 05/11/21 1424)    ED Course  I have reviewed the triage vital signs and the nursing notes.  Pertinent labs & imaging results that were available during my care of the patient were reviewed by me and considered in my medical decision making (see chart for details).    MDM Rules/Calculators/A&P                           60 year old male with a history of chronic combined systolic and diastolic congestive heart failure with an EF of less than 20%, hypertension, alcohol abuse, cocaine abuse, CKD who  presents with weeks of shortness of breath in the setting of nonadherence to his medications.  He does not have anemia or significant electrolyte abnormalities to account for his symptoms of shortness of breath.  Troponin 40, which is similar to prior values around 30.  His BNP is more significantly elevated at 3761 from prior 916 at the end of February, however he does not have any significant pulmonary edema on his chest x-ray.  Oxygen saturations decreasing down into the 80s on room air.  CT PE study ordered given hypoxia without clear pulmonary edema.    CT PE study shows no evidence of PE, shows pulmonary edema and passive venous congestion compatible with CHF.   In addition, reports left-sided numbness on my exam.  Ordered MRI for further evaluation of possible CVA.  MRI without acute intracranial abnormality.  Admitted given hypoxia, acute on chronic CHF exacerbation. Lasix ordered.   Final Clinical Impression(s) / ED Diagnoses Final diagnoses:  Acute on chronic combined systolic and diastolic CHF (congestive heart failure) (HCC)  Acute respiratory failure with hypoxia [facility])    Rx / DC Orders ED Discharge Orders     None        Alvira Monday, MD 05/11/21 2229

## 2021-05-11 NOTE — ED Triage Notes (Signed)
Pt reports shob for "several weeks". Noncompliant with medications. Admits he has not taken any in over a month.

## 2021-05-11 NOTE — ED Notes (Signed)
Pt O2 levels noted to be 90% RA. Pt placed on 2L O2 by this RN.

## 2021-05-12 ENCOUNTER — Inpatient Hospital Stay (HOSPITAL_COMMUNITY): Payer: Medicaid Other

## 2021-05-12 DIAGNOSIS — F1410 Cocaine abuse, uncomplicated: Secondary | ICD-10-CM

## 2021-05-12 DIAGNOSIS — I2510 Atherosclerotic heart disease of native coronary artery without angina pectoris: Secondary | ICD-10-CM

## 2021-05-12 DIAGNOSIS — J810 Acute pulmonary edema: Secondary | ICD-10-CM

## 2021-05-12 DIAGNOSIS — I5021 Acute systolic (congestive) heart failure: Secondary | ICD-10-CM | POA: Diagnosis not present

## 2021-05-12 DIAGNOSIS — I5023 Acute on chronic systolic (congestive) heart failure: Secondary | ICD-10-CM

## 2021-05-12 DIAGNOSIS — N182 Chronic kidney disease, stage 2 (mild): Secondary | ICD-10-CM | POA: Diagnosis not present

## 2021-05-12 LAB — ECHOCARDIOGRAM COMPLETE
AR max vel: 3.15 cm2
AV Area VTI: 2.78 cm2
AV Area mean vel: 2.81 cm2
AV Mean grad: 2 mmHg
AV Peak grad: 3.2 mmHg
Ao pk vel: 0.9 m/s
Area-P 1/2: 5.75 cm2
Calc EF: 17.5 %
Height: 71 in
MV M vel: 4.04 m/s
MV Peak grad: 65.3 mmHg
P 1/2 time: 412 msec
S' Lateral: 7.7 cm
Single Plane A2C EF: 26 %
Single Plane A4C EF: 12.6 %
Weight: 4045.18 oz

## 2021-05-12 LAB — COMPREHENSIVE METABOLIC PANEL
ALT: 19 U/L (ref 0–44)
AST: 22 U/L (ref 15–41)
Albumin: 3.2 g/dL — ABNORMAL LOW (ref 3.5–5.0)
Alkaline Phosphatase: 72 U/L (ref 38–126)
Anion gap: 10 (ref 5–15)
BUN: 40 mg/dL — ABNORMAL HIGH (ref 6–20)
CO2: 29 mmol/L (ref 22–32)
Calcium: 8.8 mg/dL — ABNORMAL LOW (ref 8.9–10.3)
Chloride: 103 mmol/L (ref 98–111)
Creatinine, Ser: 1.61 mg/dL — ABNORMAL HIGH (ref 0.61–1.24)
GFR, Estimated: 50 mL/min — ABNORMAL LOW (ref >60)
Glucose, Bld: 141 mg/dL — ABNORMAL HIGH (ref 70–99)
Potassium: 3.1 mmol/L — ABNORMAL LOW (ref 3.5–5.1)
Sodium: 142 mmol/L (ref 135–145)
Total Bilirubin: 1.2 mg/dL (ref 0.3–1.2)
Total Protein: 6.5 g/dL (ref 6.5–8.1)

## 2021-05-12 LAB — CBC
HCT: 38.1 % — ABNORMAL LOW (ref 39.0–52.0)
Hemoglobin: 12.4 g/dL — ABNORMAL LOW (ref 13.0–17.0)
MCH: 28.5 pg (ref 26.0–34.0)
MCHC: 32.5 g/dL (ref 30.0–36.0)
MCV: 87.6 fL (ref 80.0–100.0)
Platelets: 283 10*3/uL (ref 150–400)
RBC: 4.35 MIL/uL (ref 4.22–5.81)
RDW: 16.1 % — ABNORMAL HIGH (ref 11.5–15.5)
WBC: 6.3 10*3/uL (ref 4.0–10.5)
nRBC: 0 % (ref 0.0–0.2)

## 2021-05-12 LAB — HEMOGLOBIN A1C
Hgb A1c MFr Bld: 6.1 % — ABNORMAL HIGH (ref 4.8–5.6)
Mean Plasma Glucose: 128.37 mg/dL

## 2021-05-12 NOTE — Plan of Care (Signed)
  Problem: Education: Goal: Knowledge of General Education information will improve Description: Including pain rating scale, medication(s)/side effects and non-pharmacologic comfort measures 05/12/2021 1014 by Marguerita Beards, RN Outcome: Progressing 05/12/2021 1013 by Marguerita Beards, RN Outcome: Progressing   Problem: Health Behavior/Discharge Planning: Goal: Ability to manage health-related needs will improve 05/12/2021 1014 by Marguerita Beards, RN Outcome: Progressing 05/12/2021 1013 by Marguerita Beards, RN Outcome: Progressing   Problem: Clinical Measurements: Goal: Ability to maintain clinical measurements within normal limits will improve 05/12/2021 1014 by Marguerita Beards, RN Outcome: Progressing 05/12/2021 1013 by Marguerita Beards, RN Outcome: Progressing Goal: Will remain free from infection 05/12/2021 1014 by Marguerita Beards, RN Outcome: Progressing 05/12/2021 1013 by Marguerita Beards, RN Outcome: Progressing Goal: Diagnostic test results will improve 05/12/2021 1014 by Marguerita Beards, RN Outcome: Progressing 05/12/2021 1013 by Marguerita Beards, RN Outcome: Progressing Goal: Respiratory complications will improve 05/12/2021 1014 by Marguerita Beards, RN Outcome: Progressing 05/12/2021 1013 by Marguerita Beards, RN Outcome: Progressing Goal: Cardiovascular complication will be avoided 05/12/2021 1014 by Marguerita Beards, RN Outcome: Progressing 05/12/2021 1013 by Marguerita Beards, RN Outcome: Progressing   Problem: Nutrition: Goal: Adequate nutrition will be maintained 05/12/2021 1014 by Marguerita Beards, RN Outcome: Progressing 05/12/2021 1013 by Marguerita Beards, RN Outcome: Progressing   Problem: Coping: Goal: Level of anxiety will decrease 05/12/2021 1014 by Marguerita Beards, RN Outcome: Progressing 05/12/2021 1013 by Marguerita Beards, RN Outcome: Progressing   Problem: Elimination: Goal: Will not experience complications related to bowel motility 05/12/2021 1014 by Marguerita Beards, RN Outcome: Progressing 05/12/2021 1013 by Marguerita Beards, RN Outcome: Progressing Goal: Will not experience complications related to urinary retention 05/12/2021 1014 by Marguerita Beards, RN Outcome: Progressing 05/12/2021 1013 by Marguerita Beards, RN Outcome: Progressing   Problem: Pain Managment: Goal: General experience of comfort will improve 05/12/2021 1014 by Marguerita Beards, RN Outcome: Progressing 05/12/2021 1013 by Marguerita Beards, RN Outcome: Progressing   Problem: Safety: Goal: Ability to remain free from injury will improve 05/12/2021 1014 by Marguerita Beards, RN Outcome: Progressing 05/12/2021 1013 by Marguerita Beards, RN Outcome: Progressing   Problem: Skin Integrity: Goal: Risk for impaired skin integrity will decrease 05/12/2021 1014 by Marguerita Beards, RN Outcome: Progressing 05/12/2021 1013 by Marguerita Beards, RN Outcome: Progressing   Problem: Education: Goal: Ability to demonstrate management of disease process will improve 05/12/2021 1014 by Marguerita Beards, RN Outcome: Progressing 05/12/2021 1013 by Marguerita Beards, RN Outcome: Progressing Goal: Ability to verbalize understanding of medication therapies will improve 05/12/2021 1014 by Marguerita Beards, RN Outcome: Progressing 05/12/2021 1013 by Marguerita Beards, RN Outcome: Progressing Goal: Individualized Educational Video(s) 05/12/2021 1014 by Marguerita Beards, RN Outcome: Progressing 05/12/2021 1013 by Marguerita Beards, RN Outcome: Progressing   Problem: Cardiac: Goal: Ability to achieve and maintain adequate cardiopulmonary perfusion will improve 05/12/2021 1014 by Marguerita Beards, RN Outcome: Progressing 05/12/2021 1013 by Marguerita Beards, RN Outcome: Progressing   Problem: Activity: Goal: Capacity to carry out activities will improve 05/12/2021 1014 by Marguerita Beards, RN Outcome: Progressing 05/12/2021 1013 by Marguerita Beards, RN Outcome: Progressing

## 2021-05-12 NOTE — Plan of Care (Signed)
  Problem: Education: Goal: Knowledge of General Education information will improve Description: Including pain rating scale, medication(s)/side effects and non-pharmacologic comfort measures Outcome: Progressing   Problem: Clinical Measurements: Goal: Respiratory complications will improve Outcome: Progressing Goal: Cardiovascular complication will be avoided Outcome: Progressing   

## 2021-05-12 NOTE — Progress Notes (Signed)
PROGRESS NOTE  Dale [name] DDU:[id number] DOB: [birth year] DOA: 05/11/2021 PCP: Vesta Mixer, MD  HPI/Recap of past 41 hours: 60 year old male with heart failure admitted for heart failure exacerbation.  Patient seen and examined at bedside.  Patient stated that he is feeling little better but still has some chest congestion.  Assessment/Plan: Principal Problem:   Acute exacerbation of CHF (congestive heart failure) (HCC) Active Problems:   Essential hypertension   Acute pulmonary edema (HCC)   Cocaine abuse (HCC)   Chronic kidney disease, stage II (mild)   Hyperlipidemia   Coronary artery disease involving native coronary artery of native heart without angina pectoris   Hypokalemia  1.  Acute on chronic combined heart failure. Continue telemetry monitoring His BNP was 376 on admission. Denies any chest pain now Continue IV diuresis with Lasix 40 mg twice daily Continue fluid restriction of 1200 cc Continue Cozaar Coreg BiDil as tolerated 2D echo pending  2.  Cocaine abuse.  Patient counseled against cocaine use  3.  Noncompliance.  Patient stated he is due to having no funds but then he smokes cocaine.  He requested Child psychotherapist.  Social worker will be called  4.  Hypokalemia we will replete potassium is 3.1 today  Hypertension continue Coreg and Cozaar  6.  Chronic kidney disease stage III A.  Stable  7.  Left arm numbness due to neuropathy MRI was negative Continue trial of gabapentin  Code Status: Full  Severity of Illness: The appropriate patient status for this patient is INPATIENT. Inpatient status is judged to be reasonable and necessary in order to provide the required intensity of service to ensure the patient's safety. The patient's presenting symptoms, physical exam findings, and initial radiographic and laboratory data in the context of their chronic comorbidities is felt to place them at high risk for further clinical deterioration.  Furthermore, it is not anticipated that the patient will be medically stable for discharge from the hospital within 2 midnights of admission. The following factors support the patient status of inpatient.   " Congestive heart failure needs IV diuresis  * I certify that at the point of admission it is my clinical judgment that the patient will require inpatient hospital care spanning beyond 2 midnights from the point of admission due to high intensity of service, high risk for further deterioration and high frequency of surveillance required.*   Family Communication: Patient  Disposition Plan: Home when she is adequately diuresed   Consultants: None  Procedures: None  Antimicrobials: None  DVT prophylaxis: Lovenox   Objective: Vitals:   05/11/21 1939 05/12/21 0006 05/12/21 0408 05/12/21 0500  BP: (!) 123/103 (!) 129/97 (!) 117/99   Pulse: 100 85 93   Resp:  20 18   Temp: 98 F (36.7 C) 97.6 F (36.4 C) (!) 97.5 F (36.4 C)   TempSrc: Oral Oral Oral   SpO2: 99% 93% 97%   Weight:    114.7 kg  Height:        Intake/Output Summary (Last 24 hours) at 05/12/2021 0844 Last data filed at 05/12/2021 0800 Gross per 24 hour  Intake 957 ml  Output 650 ml  Net 307 ml   Filed Weights   05/11/21 0622 05/12/21 0500  Weight: 117 kg 114.7 kg   Body mass index is 35.26 kg/m.  Exam:  General: 60 y.o. year-old male well developed well nourished in no acute distress.  Alert and oriented x3. Cardiovascular: Regular rate and rhythm with no rubs or  gallops.  No thyromegaly or JVD noted.   Respiratory: Few rales. Good inspiratory effort. Abdomen: Soft nontender nondistended with normal bowel sounds x4 quadrants. Musculoskeletal: No lower extremity edema. 2/4 pulses in all 4 extremities. Skin: No ulcerative lesions noted or rashes, Psychiatry: Mood is appropriate for condition and setting    Data Reviewed: CBC: Recent Labs  Lab 05/11/21 0825 05/12/21 0447  WBC 6.6 6.3   NEUTROABS 4.9  --   HGB 13.2 12.4*  HCT 41.3 38.1*  MCV 88.2 87.6  PLT 296 283   Basic Metabolic Panel: Recent Labs  Lab 05/11/21 0825 05/11/21 2014 05/12/21 0447  NA 139  --  142  K 3.2*  --  3.1*  CL 102  --  103  CO2 27  --  29  GLUCOSE 107*  --  141*  BUN 36*  --  40*  CREATININE 1.48*  --  1.61*  CALCIUM 8.6*  --  8.8*  MG  --  2.2  --    GFR: Estimated Creatinine Clearance: 65.2 mL/min (A) (by C-G formula based on SCr of 1.61 mg/dL (H)). Liver Function Tests: Recent Labs  Lab 05/11/21 0825 05/12/21 0447  AST 24 22  ALT 20 19  ALKPHOS 78 72  BILITOT 1.4* 1.2  PROT 7.3 6.5  ALBUMIN 3.6 3.2*   No results for input(s): LIPASE, AMYLASE in the last 168 hours. No results for input(s): AMMONIA in the last 168 hours. Coagulation Profile: No results for input(s): INR, PROTIME in the last 168 hours. Cardiac Enzymes: No results for input(s): CKTOTAL, CKMB, CKMBINDEX, TROPONINI in the last 168 hours. BNP (last 3 results) No results for input(s): PROBNP in the last 8760 hours. HbA1C: Recent Labs    05/11/21 2014  HGBA1C 6.1*   CBG: No results for input(s): GLUCAP in the last 168 hours. Lipid Profile: No results for input(s): CHOL, HDL, LDLCALC, TRIG, CHOLHDL, LDLDIRECT in the last 72 hours. Thyroid Function Tests: No results for input(s): TSH, T4TOTAL, FREET4, T3FREE, THYROIDAB in the last 72 hours. Anemia Panel: No results for input(s): VITAMINB12, FOLATE, FERRITIN, TIBC, IRON, RETICCTPCT in the last 72 hours. Urine analysis:    Component Value Date/Time   COLORURINE STRAW (A) 12/25/2020 1143   APPEARANCEUR CLEAR (A) 12/25/2020 1143   LABSPEC 1.006 12/25/2020 1143   PHURINE 7.0 12/25/2020 1143   GLUCOSEU NEGATIVE 12/25/2020 1143   HGBUR NEGATIVE 12/25/2020 1143   BILIRUBINUR NEGATIVE 12/25/2020 1143   KETONESUR NEGATIVE 12/25/2020 1143   PROTEINUR NEGATIVE 12/25/2020 1143   UROBILINOGEN 0.2 03/11/2016 0934   NITRITE NEGATIVE 12/25/2020 1143    LEUKOCYTESUR NEGATIVE 12/25/2020 1143   Sepsis Labs: @LABRCNTIP (procalcitonin:4,lacticidven:4)  ) Recent Results (from the past 240 hour(s))  Resp Panel by RT-PCR (Flu A&B, Covid) Nasopharyngeal Swab     Status: None   Collection Time: 05/11/21  2:25 PM   Specimen: Nasopharyngeal Swab; Nasopharyngeal(NP) swabs in vial transport medium  Result Value Ref Range Status   SARS Coronavirus 2 by RT PCR NEGATIVE NEGATIVE Final    Comment: (NOTE) SARS-CoV-2 target nucleic acids are NOT DETECTED.  The SARS-CoV-2 RNA is generally detectable in upper respiratory specimens during the acute phase of infection. The lowest concentration of SARS-CoV-2 viral copies this assay can detect is 138 copies/mL. A negative result does not preclude SARS-Cov-2 infection and should not be used as the sole basis for treatment or other patient management decisions. A negative result may occur with  improper specimen collection/handling, submission of specimen other than nasopharyngeal swab, presence  of viral mutation(s) within the areas targeted by this assay, and inadequate number of viral copies(<138 copies/mL). A negative result must be combined with clinical observations, patient history, and epidemiological information. The expected result is Negative.  Fact Sheet for Patients:  BloggerCourse.com  Fact Sheet for Healthcare Providers:  SeriousBroker.it  This test is no t yet approved or cleared by the [place] FDA and  has been authorized for detection and/or diagnosis of SARS-CoV-2 by FDA under an Emergency Use Authorization (EUA). This EUA will remain  in effect (meaning this test can be used) for the duration of the COVID-19 declaration under Section 564(b)(1) of the Act, 21 U.S.C.section 360bbb-3(b)(1), unless the authorization is terminated  or revoked sooner.       Influenza A by PCR NEGATIVE NEGATIVE Final   Influenza B by PCR NEGATIVE  NEGATIVE Final    Comment: (NOTE) The Xpert Xpress SARS-CoV-2/FLU/RSV plus assay is intended as an aid in the diagnosis of influenza from Nasopharyngeal swab specimens and should not be used as a sole basis for treatment. Nasal washings and aspirates are unacceptable for Xpert Xpress SARS-CoV-2/FLU/RSV testing.  Fact Sheet for Patients: BloggerCourse.com  Fact Sheet for Healthcare Providers: SeriousBroker.it  This test is not yet approved or cleared by the [place] FDA and has been authorized for detection and/or diagnosis of SARS-CoV-2 by FDA under an Emergency Use Authorization (EUA). This EUA will remain in effect (meaning this test can be used) for the duration of the COVID-19 declaration under Section 564(b)(1) of the Act, 21 U.S.C. section 360bbb-3(b)(1), unless the authorization is terminated or revoked.  Performed at [facility], [address] [place], [place] [address]       Studies: CT Angio Chest PE W and/or Wo Contrast  Result Date: 05/11/2021 CLINICAL DATA:  Rule out pulmonary embolus. EXAM: CT ANGIOGRAPHY CHEST WITH CONTRAST TECHNIQUE: Multidetector CT imaging of the chest was performed using the standard protocol during bolus administration of intravenous contrast. Multiplanar CT image reconstructions and MIPs were obtained to evaluate the vascular anatomy. CONTRAST:  39mL OMNIPAQUE IOHEXOL 350 MG/ML SOLN COMPARISON:  09/08/20 FINDINGS: Cardiovascular: Satisfactory opacification of the pulmonary arteries to the segmental level. No evidence of pulmonary embolism. Multi chamber cardiac enlargement is again noted, similar to previous exam. No pericardial effusion. Mild aortic atherosclerosis and coronary artery calcifications. Mediastinum/Nodes: Normal appearance of the thyroid gland. The trachea appears patent and is midline. Normal appearance of the esophagus. No axillary, supraclavicular  pre-vascular lymph node is enlarged measuring 1.9 cm, image 97/5. This is unchanged from previous exam. 2.2 cm subcarinal lymph node is noted, image 130/5. Also unchanged. Lungs/Pleura: No pleural effusion. No airspace consolidation, atelectasis or pneumothorax. Mild diffuse ground-glass attenuation identified. Interlobular septal thickening identified in the right base. 4 mm perifissural right middle lobe lung nodule is identified, unchanged from previous exam. Likely benign intrapulmonary lymph node. Upper Abdomen: No acute abnormality. Reflux of contrast material from the right atrium into the IVC and hepatic veins compatible with passive venous congestion. Musculoskeletal: No chest wall abnormality. No acute or significant osseous findings. Review of the MIP images confirms the above findings. IMPRESSION: 1. No evidence for acute pulmonary embolus. 2. Multi chamber cardiac enlargement, mild pulmonary edema and passive venous congestion compatible with CHF. 3. Aortic atherosclerosis and coronary artery calcifications. 4. Stable enlarged pre-vascular and subcarinal lymph nodes. Etiology is indeterminate but may be reactive in etiology. Aortic Atherosclerosis (ICD10-I70.0). Electronically Signed   By: Signa Kell M.D.   On: 05/11/2021  12:26   MR BRAIN WO CONTRAST  Result Date: 05/11/2021 CLINICAL DATA:  Neuro deficit, acute, stroke suspected. EXAM: MRI HEAD WITHOUT CONTRAST TECHNIQUE: Multiplanar, multiecho pulse sequences of the brain and surrounding structures were obtained without intravenous contrast. COMPARISON:  None. FINDINGS: Brain: There is no evidence of an acute infarct, mass, midline shift, or extra-axial fluid collection. The ventricles are normal in size. Patchy T2 hyperintensities in the cerebral white matter bilaterally are nonspecific but compatible with moderately age advanced chronic small vessel ischemic disease with some areas of T2 shine through noted on diffusion imaging. There are  small cortical infarcts in the right parieto-occipital region and posterior right insula which have a subacute to chronic appearance. There is a small amount of chronic blood products associated with the right parieto-occipital infarct. There is also likely a subtle small chronic cortical infarct in the posterior left frontal lobe with a small amount of chronic blood products. Vascular: Major intracranial vascular flow voids are preserved. Skull and upper cervical spine: No suspicious marrow lesion. Cystic focus in the right petrous apex appears contiguous with Meckel's cave and likely reflects an incidental cephalocele. Sinuses/Orbits: Unremarkable orbits. Small right mastoid effusion. Clear paranasal sinuses. Other: None. IMPRESSION: 1. No acute intracranial abnormality. 2. Moderate chronic small vessel ischemic disease with multiple small nonacute infarcts as above. Electronically Signed   By: Sebastian Ache M.D.   On: 05/11/2021 11:10    Scheduled Meds:  aspirin EC  81 mg Oral Daily   atorvastatin  40 mg Oral Daily   carvedilol  6.25 mg Oral BID WC   enoxaparin (LOVENOX) injection  40 mg Subcutaneous Q24H   ferrous sulfate  325 mg Oral Q breakfast   furosemide  40 mg Intravenous Q12H   gabapentin  100 mg Oral TID   losartan  12.5 mg Oral Daily   pantoprazole  40 mg Oral Daily   potassium chloride  20 mEq Oral BID    Continuous Infusions:   LOS: 1 day     Myrtie Neither, MD [facility]  To reach me or the doctor on call, go to: [email or web address] Password [facility]  05/12/2021, 8:44 AM

## 2021-05-12 NOTE — Progress Notes (Signed)
*  PRELIMINARY RESULTS* Echocardiogram 2D Echocardiogram has been performed.  Neomia Dear RDCS 05/12/2021, 9:50 AM

## 2021-05-12 NOTE — Plan of Care (Signed)

## 2021-05-13 DIAGNOSIS — I5023 Acute on chronic systolic (congestive) heart failure: Secondary | ICD-10-CM | POA: Diagnosis not present

## 2021-05-13 DIAGNOSIS — N182 Chronic kidney disease, stage 2 (mild): Secondary | ICD-10-CM | POA: Diagnosis not present

## 2021-05-13 DIAGNOSIS — F1410 Cocaine abuse, uncomplicated: Secondary | ICD-10-CM | POA: Diagnosis not present

## 2021-05-13 DIAGNOSIS — J810 Acute pulmonary edema: Secondary | ICD-10-CM | POA: Diagnosis not present

## 2021-05-13 LAB — POTASSIUM: Potassium: 3.7 mmol/L (ref 3.5–5.1)

## 2021-05-13 LAB — MAGNESIUM: Magnesium: 2.2 mg/dL (ref 1.7–2.4)

## 2021-05-13 NOTE — Progress Notes (Addendum)
PROGRESS NOTE  Nester Bachus ZHG:[id number] DOB: 1960-11-25 DOA: 05/11/2021 PCP: Vesta Mixer, MD  HPI/Recap of past 50 hours: 60 year old male with heart failure admitted for heart failure exacerbation.  Patient seen and examined at bedside.  Patient stated that he is feeling little better but still has some chest congestion.  May 13, 2021: Patient seen and examined at bedside, he stated he is not feeling good today stated that he still has some chest congestion, also shortness of breath with exertion and he has not slept well today.  Also stated that he has cocaine problem and he had gone to [facility] for rehab in [place] for cocaine use and he did well..  Seen that he wants to be compliant and that his current girlfriend is a bad influence also that his neighborhood is a bad neighborhood is full of drugs and that he currently lives at [facility] in [place] and does not want to go back there and he would like to move away from that environment because he wants to change his life, he wants to live and he does not want to die from drug overdose.  Nurse reported a 24 beat V. tach this afternoon.  Recheck his potassium and magnesium.  He had been off his oxygen and there is no shortness of breath noted, patient is saturating 100% on room air  Assessment/Plan: Principal Problem:   Acute exacerbation of CHF (congestive heart failure) (HCC) Active Problems:   Essential hypertension   Acute pulmonary edema (HCC)   Cocaine abuse (HCC)   Chronic kidney disease, stage II (mild)   Hyperlipidemia   Coronary artery disease involving native coronary artery of native heart without angina pectoris   Hypokalemia  1.  Acute on chronic combined heart failure. Continue telemetry monitoring His BNP was 376 on admission. Denies any chest pain now Continue IV diuresis with Lasix 40 mg twice daily Continue fluid restriction of 1200 cc Continue Cozaar Coreg BiDil as tolerated 2D  echo pending  2.  Cocaine abuse.  Patient counseled against cocaine use patient stated that he needs help to stop cocaine use and also he had been to [facility] rehab and would like to go back there also wants to change his environment which is a bad influence.  Social worker has been consulted  3.  Noncompliance.  Patient stated he is due to having no funds but then he smokes cocaine.  He requested Child psychotherapist.  Social worker will be called  4.  Hypokalemia we will replete potassium is 3.1 today  5.  Hypertension continue Coreg and Cozaar  6.  Chronic kidney disease stage III A.  Stable  7.  Left arm numbness due to neuropathy MRI was negative Continue trial of gabapentin  8. 24 beat V. tach this afternoon.  Recheck his potassium and magnesium  Code Status: Full  Severity of Illness: The appropriate patient status for this patient is INPATIENT. Inpatient status is judged to be reasonable and necessary in order to provide the required intensity of service to ensure the patient's safety. The patient's presenting symptoms, physical exam findings, and initial radiographic and laboratory data in the context of their chronic comorbidities is felt to place them at high risk for further clinical deterioration. Furthermore, it is not anticipated that the patient will be medically stable for discharge from the hospital within 2 midnights of admission. The following factors support the patient status of inpatient.   " Congestive heart failure needs IV  diuresis  * I certify that at the point of admission it is my clinical judgment that the patient will require inpatient hospital care spanning beyond 2 midnights from the point of admission due to high intensity of service, high risk for further deterioration and high frequency of surveillance required.*   Family Communication: Patient  Disposition Plan: Home when she is adequately  diuresed   Consultants: None  Procedures: None  Antimicrobials: None  DVT prophylaxis: Lovenox   Objective: Vitals:   05/13/21 0500 05/13/21 0638 05/13/21 0720 05/13/21 1359  BP:  106/89  104/89  Pulse:  87  80  Resp:  18  16  Temp:      TempSrc:      SpO2:  100% 100% 100%  Weight: 115.6 kg     Height:        Intake/Output Summary (Last 24 hours) at 05/13/2021 1414 Last data filed at 05/13/2021 0720 Gross per 24 hour  Intake 958 ml  Output --  Net 958 ml    Filed Weights   05/11/21 0622 05/12/21 0500 05/13/21 0500  Weight: 117 kg 114.7 kg 115.6 kg   Body mass index is 35.54 kg/m.  Exam:  General: 60 y.o. year-old male well developed well nourished in no acute distress.  Alert and oriented x3.  Obese Cardiovascular: Regular rate and rhythm with no rubs or gallops.  No thyromegaly or JVD noted.   Respiratory: Few rales. Good inspiratory effort. Abdomen: Soft nontender nondistended with normal bowel sounds x4 quadrants. Musculoskeletal: No lower extremity edema. 2/4 pulses in all 4 extremities. Skin: No ulcerative lesions noted or rashes, Psychiatry: Mood is appropriate for condition and setting    Data Reviewed: CBC: Recent Labs  Lab 05/11/21 0825 05/12/21 0447  WBC 6.6 6.3  NEUTROABS 4.9  --   HGB 13.2 12.4*  HCT 41.3 38.1*  MCV 88.2 87.6  PLT 296 283    Basic Metabolic Panel: Recent Labs  Lab 05/11/21 0825 05/11/21 2014 05/12/21 0447  NA 139  --  142  K 3.2*  --  3.1*  CL 102  --  103  CO2 27  --  29  GLUCOSE 107*  --  141*  BUN 36*  --  40*  CREATININE 1.48*  --  1.61*  CALCIUM 8.6*  --  8.8*  MG  --  2.2  --     GFR: Estimated Creatinine Clearance: 65.4 mL/min (A) (by C-G formula based on SCr of 1.61 mg/dL (H)). Liver Function Tests: Recent Labs  Lab 05/11/21 0825 05/12/21 0447  AST 24 22  ALT 20 19  ALKPHOS 78 72  BILITOT 1.4* 1.2  PROT 7.3 6.5  ALBUMIN 3.6 3.2*    No results for input(s): LIPASE, AMYLASE in the  last 168 hours. No results for input(s): AMMONIA in the last 168 hours. Coagulation Profile: No results for input(s): INR, PROTIME in the last 168 hours. Cardiac Enzymes: No results for input(s): CKTOTAL, CKMB, CKMBINDEX, TROPONINI in the last 168 hours. BNP (last 3 results) No results for input(s): PROBNP in the last 8760 hours. HbA1C: Recent Labs    05/11/21 2014  HGBA1C 6.1*    CBG: No results for input(s): GLUCAP in the last 168 hours. Lipid Profile: No results for input(s): CHOL, HDL, LDLCALC, TRIG, CHOLHDL, LDLDIRECT in the last 72 hours. Thyroid Function Tests: No results for input(s): TSH, T4TOTAL, FREET4, T3FREE, THYROIDAB in the last 72 hours. Anemia Panel: No results for input(s): VITAMINB12, FOLATE, FERRITIN, TIBC, IRON, RETICCTPCT  in the last 72 hours. Urine analysis:    Component Value Date/Time   COLORURINE STRAW (A) 12/25/2020 1143   APPEARANCEUR CLEAR (A) 12/25/2020 1143   LABSPEC 1.006 12/25/2020 1143   PHURINE 7.0 12/25/2020 1143   GLUCOSEU NEGATIVE 12/25/2020 1143   HGBUR NEGATIVE 12/25/2020 1143   BILIRUBINUR NEGATIVE 12/25/2020 1143   KETONESUR NEGATIVE 12/25/2020 1143   PROTEINUR NEGATIVE 12/25/2020 1143   UROBILINOGEN 0.2 03/11/2016 0934   NITRITE NEGATIVE 12/25/2020 1143   LEUKOCYTESUR NEGATIVE 12/25/2020 1143   Sepsis Labs: @LABRCNTIP (procalcitonin:4,lacticidven:4)  ) Recent Results (from the past 240 hour(s))  Resp Panel by RT-PCR (Flu A&B, Covid) Nasopharyngeal Swab     Status: None   Collection Time: 05/11/21  2:25 PM   Specimen: Nasopharyngeal Swab; Nasopharyngeal(NP) swabs in vial transport medium  Result Value Ref Range Status   SARS Coronavirus 2 by RT PCR NEGATIVE NEGATIVE Final    Comment: (NOTE) SARS-CoV-2 target nucleic acids are NOT DETECTED.  The SARS-CoV-2 RNA is generally detectable in upper respiratory specimens during the acute phase of infection. The lowest concentration of SARS-CoV-2 viral copies this assay can detect  is 138 copies/mL. A negative result does not preclude SARS-Cov-2 infection and should not be used as the sole basis for treatment or other patient management decisions. A negative result may occur with  improper specimen collection/handling, submission of specimen other than nasopharyngeal swab, presence of viral mutation(s) within the areas targeted by this assay, and inadequate number of viral copies(<138 copies/mL). A negative result must be combined with clinical observations, patient history, and epidemiological information. The expected result is Negative.  Fact Sheet for Patients:  07/12/21  Fact Sheet for Healthcare Providers:  BloggerCourse.com  This test is no t yet approved or cleared by the [place] FDA and  has been authorized for detection and/or diagnosis of SARS-CoV-2 by FDA under an Emergency Use Authorization (EUA). This EUA will remain  in effect (meaning this test can be used) for the duration of the COVID-19 declaration under Section 564(b)(1) of the Act, 21 U.S.C.section 360bbb-3(b)(1), unless the authorization is terminated  or revoked sooner.       Influenza A by PCR NEGATIVE NEGATIVE Final   Influenza B by PCR NEGATIVE NEGATIVE Final    Comment: (NOTE) The Xpert Xpress SARS-CoV-2/FLU/RSV plus assay is intended as an aid in the diagnosis of influenza from Nasopharyngeal swab specimens and should not be used as a sole basis for treatment. Nasal washings and aspirates are unacceptable for Xpert Xpress SARS-CoV-2/FLU/RSV testing.  Fact Sheet for Patients: [place]  Fact Sheet for Healthcare Providers: BloggerCourse.com  This test is not yet approved or cleared by the [place] FDA and has been authorized for detection and/or diagnosis of SARS-CoV-2 by FDA under an Emergency Use Authorization (EUA). This EUA will remain in effect  (meaning this test can be used) for the duration of the COVID-19 declaration under Section 564(b)(1) of the Act, 21 U.S.C. section 360bbb-3(b)(1), unless the authorization is terminated or revoked.  Performed at [facility], [address], [place], [place]       Studies: No results found.  Scheduled Meds:  aspirin EC  81 mg Oral Daily   atorvastatin  40 mg Oral Daily   carvedilol  6.25 mg Oral BID WC   enoxaparin (LOVENOX) injection  40 mg Subcutaneous Q24H   ferrous sulfate  325 mg Oral Q breakfast   furosemide  40 mg Intravenous Q12H   gabapentin  100 mg Oral  TID   losartan  12.5 mg Oral Daily   pantoprazole  40 mg Oral Daily    Continuous Infusions:   LOS: 2 days     Myrtie Neither, MD [facility]  To reach me or the doctor on call, go to: [email or web address] Password [facility]  05/13/2021, 2:14 PM

## 2021-05-13 NOTE — Plan of Care (Signed)
  Problem: Health Behavior/Discharge Planning: Goal: Ability to manage health-related needs will improve Outcome: Progressing   Problem: Clinical Measurements: Goal: Ability to maintain clinical measurements within normal limits will improve Outcome: Progressing Goal: Will remain free from infection Outcome: Progressing Goal: Diagnostic test results will improve Outcome: Progressing Goal: Respiratory complications will improve Outcome: Progressing Goal: Cardiovascular complication will be avoided Outcome: Progressing   Problem: Activity: Goal: Risk for activity intolerance will decrease Outcome: Progressing   Problem: Coping: Goal: Level of anxiety will decrease Outcome: Progressing   Problem: Elimination: Goal: Will not experience complications related to bowel motility Outcome: Progressing Goal: Will not experience complications related to urinary retention Outcome: Progressing   Problem: Pain Managment: Goal: General experience of comfort will improve Outcome: Progressing   Problem: Safety: Goal: Ability to remain free from injury will improve Outcome: Progressing   Problem: Skin Integrity: Goal: Risk for impaired skin integrity will decrease Outcome: Progressing   Problem: Education: Goal: Ability to demonstrate management of disease process will improve Outcome: Progressing Goal: Ability to verbalize understanding of medication therapies will improve Outcome: Progressing Goal: Individualized Educational Video(s) Outcome: Progressing   Problem: Activity: Goal: Capacity to carry out activities will improve Outcome: Progressing   Problem: Cardiac: Goal: Ability to achieve and maintain adequate cardiopulmonary perfusion will improve Outcome: Progressing   Problem: Education: Goal: Ability to demonstrate management of disease process will improve Outcome: Progressing Goal: Ability to verbalize understanding of medication therapies will improve Outcome:  Progressing Goal: Individualized Educational Video(s) Outcome: Progressing   Problem: Activity: Goal: Capacity to carry out activities will improve Outcome: Progressing   Problem: Cardiac: Goal: Ability to achieve and maintain adequate cardiopulmonary perfusion will improve Outcome: Progressing

## 2021-05-14 ENCOUNTER — Encounter (HOSPITAL_COMMUNITY): Payer: Self-pay | Admitting: Internal Medicine

## 2021-05-14 DIAGNOSIS — I5043 Acute on chronic combined systolic (congestive) and diastolic (congestive) heart failure: Secondary | ICD-10-CM | POA: Diagnosis not present

## 2021-05-14 LAB — BASIC METABOLIC PANEL
Anion gap: 10 (ref 5–15)
BUN: 52 mg/dL — ABNORMAL HIGH (ref 6–20)
CO2: 23 mmol/L (ref 22–32)
Calcium: 9.2 mg/dL (ref 8.9–10.3)
Chloride: 105 mmol/L (ref 98–111)
Creatinine, Ser: 1.78 mg/dL — ABNORMAL HIGH (ref 0.61–1.24)
GFR, Estimated: 44 mL/min — ABNORMAL LOW (ref >60)
Glucose, Bld: 120 mg/dL — ABNORMAL HIGH (ref 70–99)
Potassium: 3.9 mmol/L (ref 3.5–5.1)
Sodium: 138 mmol/L (ref 135–145)

## 2021-05-14 MED ORDER — LORAZEPAM 1 MG PO TABS
2.0000 mg | ORAL_TABLET | Freq: Two times a day (BID) | ORAL | Status: AC | PRN
  Administered 2021-05-14 (×2): 2 mg via ORAL
  Filled 2021-05-14 (×3): qty 2

## 2021-05-14 MED ORDER — TRAZODONE HCL 50 MG PO TABS
50.0000 mg | ORAL_TABLET | Freq: Every evening | ORAL | Status: DC | PRN
  Administered 2021-05-14 – 2021-05-15 (×2): 50 mg via ORAL
  Filled 2021-05-14 (×2): qty 1

## 2021-05-14 NOTE — Progress Notes (Addendum)
Patient continues to remove telemetry monitor and pox. Multiple times tele monitor had to placed back on patient. Patient stated "I dont know how the leads keep coming off".   Patient non compliant with fluid intake, Many request for food and drinks throughout the night.   Patient with calls out to friends and Mother because he thinks his girfriend will find his money? Patient anxious about his results and asking if the oxygen in the room works. Patient continues to be restless at times. Frequent rounding continued to replace tele box, Pox monitor  and reassure patient.

## 2021-05-14 NOTE — Progress Notes (Signed)
Pt found having SOB while leaned on a table. Pt took out his O2 and stated that "I feel like suffocating! I need some air". This RN have the pt sit down and place him back on oxygen. V/S are stable and sating 100% at 5L O2. PRN ativan given. RT and NP Garner Nash made aware.Will continue to monitor.

## 2021-05-14 NOTE — Progress Notes (Addendum)
Progress Note    Dale NormaRonald Keith Degeorge  ZDG:[id number] DOB: 10-01-1961  DOA: 05/11/2021 PCP: Vesta MixerNahser, Philip J, MD      Brief Narrative:    Medical records reviewed and are as summarized below:  Dale [name] is a 60 y.o. male with medical history significant for combined chronic systolic and diastolic CHF, cocaine use disorder, alcohol use disorder, hypertension, who presented to the hospital because of shortness of breath, orthopnea and increasing leg swelling for about 2 weeks duration.  He was admitted to the hospital for acute exacerbation of chronic diastolic and systolic CHF.    Assessment/Plan:   Principal Problem:   Acute exacerbation of CHF (congestive heart failure) (HCC) Active Problems:   Essential hypertension   Acute pulmonary edema (HCC)   Cocaine abuse (HCC)   Chronic kidney disease, stage II (mild)   Hyperlipidemia   Coronary artery disease involving native coronary artery of native heart without angina pectoris   Hypokalemia   Body mass index is 36.28 kg/m.  (Morbid obesity)  Acute exacerbation of chronic systolic and diastolic CHF: Continue IV Lasix.  Monitor BMP, daily weight and urine output.  2D echo on 05/12/2021 showed EF less than 20%, severely dilated LV and RV, grade 2 diastolic dysfunction, severely dilated left and right atria  Hypokalemia: Improved.  Continue to monitor  Hypertension: Continue antihypertensives  CKD stage IIIa: Creatinine is stable.  Anxiety/insomnia: Ativan as needed for anxiety and trazodone as needed for sleep.  Alcohol and cocaine use disorder: Counseled to quit using alcohol and cocaine.  Medical nonadherence: The importance of medical adherence was reiterated.  Prognosis is poor.   Diet Order             Diet Heart Room service appropriate? Yes; Fluid consistency: Thin; Fluid restriction: 1200 mL Fluid  Diet effective now                       Consultants: None  Procedures: None    Medications:    aspirin EC  81 mg Oral Daily   atorvastatin  40 mg Oral Daily   carvedilol  6.25 mg Oral BID WC   enoxaparin (LOVENOX) injection  40 mg Subcutaneous Q24H   ferrous sulfate  325 mg Oral Q breakfast   furosemide  40 mg Intravenous Q12H   gabapentin  100 mg Oral TID   losartan  12.5 mg Oral Daily   pantoprazole  40 mg Oral Daily   Continuous Infusions:   Anti-infectives (From admission, onward)    None              Family Communication/Anticipated D/C date and plan/Code Status   DVT prophylaxis: enoxaparin (LOVENOX) injection 40 mg Start: 05/11/21 2045     Code Status: Full Code  Family Communication: None Disposition Plan:    Status is: Inpatient  Remains inpatient appropriate because:IV treatments appropriate due to intensity of illness or inability to take PO and Inpatient level of care appropriate due to severity of illness  Dispo: The patient is from: Home              Anticipated d/c is to: Home              Patient currently is not medically stable to d/c.   Difficult to place patient No           Subjective:   Interval events noted.  He complains of shortness of breath.  He said he  cannot lay down flat because of difficulty breathing when he lays down flat.  He also complains of sleepless nights and said he has not slept well for several days.  He requests something to help him sleep.    Objective:    Vitals:   05/13/21 1359 05/13/21 2133 05/14/21 0441 05/14/21 0500  BP: 104/89 115/88 (!) 116/93   Pulse: 80 84 86   Resp: 16 16 18    Temp: (!) 97.5 F (36.4 C) (!) 97.5 F (36.4 C) (!) 97.5 F (36.4 C)   TempSrc: Oral Oral    SpO2: 100% 100% 96%   Weight:    118 kg  Height:       No data found.   Intake/Output Summary (Last 24 hours) at 05/14/2021 0850 Last data filed at 05/14/2021 0841 Gross per 24 hour  Intake 760 ml  Output 850 ml  Net -90 ml   Filed  Weights   05/12/21 0500 05/13/21 0500 05/14/21 0500  Weight: 114.7 kg 115.6 kg 118 kg    Exam:  GEN: NAD SKIN: Warm and dry. Gynecomastia EYES: EOMI ENT: MMM CV: RRR PULM: CTA B ABD: soft, obese, NT, +BS CNS: AAO x 3, non focal EXT: B/l leg edema 2+, no tenderness or erythema        Data Reviewed:   I have personally reviewed following labs and imaging studies:  Labs: Labs show the following:   Basic Metabolic Panel: Recent Labs  Lab 05/11/21 0825 05/11/21 2014 05/12/21 0447 05/13/21 1457 05/14/21 0424  NA 139  --  142  --  138  K 3.2*  --  3.1* 3.7 3.9  CL 102  --  103  --  105  CO2 27  --  29  --  23  GLUCOSE 107*  --  141*  --  120*  BUN 36*  --  40*  --  52*  CREATININE 1.48*  --  1.61*  --  1.78*  CALCIUM 8.6*  --  8.8*  --  9.2  MG  --  2.2  --  2.2  --    GFR Estimated Creatinine Clearance: 59.8 mL/min (A) (by C-G formula based on SCr of 1.78 mg/dL (H)). Liver Function Tests: Recent Labs  Lab 05/11/21 0825 05/12/21 0447  AST 24 22  ALT 20 19  ALKPHOS 78 72  BILITOT 1.4* 1.2  PROT 7.3 6.5  ALBUMIN 3.6 3.2*   No results for input(s): LIPASE, AMYLASE in the last 168 hours. No results for input(s): AMMONIA in the last 168 hours. Coagulation profile No results for input(s): INR, PROTIME in the last 168 hours.  CBC: Recent Labs  Lab 05/11/21 0825 05/12/21 0447  WBC 6.6 6.3  NEUTROABS 4.9  --   HGB 13.2 12.4*  HCT 41.3 38.1*  MCV 88.2 87.6  PLT 296 283   Cardiac Enzymes: No results for input(s): CKTOTAL, CKMB, CKMBINDEX, TROPONINI in the last 168 hours. BNP (last 3 results) No results for input(s): PROBNP in the last 8760 hours. CBG: No results for input(s): GLUCAP in the last 168 hours. D-Dimer: No results for input(s): DDIMER in the last 72 hours. Hgb A1c: Recent Labs    05/11/21 2014  HGBA1C 6.1*   Lipid Profile: No results for input(s): CHOL, HDL, LDLCALC, TRIG, CHOLHDL, LDLDIRECT in the last 72 hours. Thyroid function  studies: No results for input(s): TSH, T4TOTAL, T3FREE, THYROIDAB in the last 72 hours.  Invalid input(s): FREET3 Anemia work up: No results for input(s): VITAMINB12,  FOLATE, FERRITIN, TIBC, IRON, RETICCTPCT in the last 72 hours. Sepsis Labs: Recent Labs  Lab 05/11/21 0825 05/12/21 0447  WBC 6.6 6.3    Microbiology Recent Results (from the past 240 hour(s))  Resp Panel by RT-PCR (Flu A&B, Covid) Nasopharyngeal Swab     Status: None   Collection Time: 05/11/21  2:25 PM   Specimen: Nasopharyngeal Swab; Nasopharyngeal(NP) swabs in vial transport medium  Result Value Ref Range Status   SARS Coronavirus 2 by RT PCR NEGATIVE NEGATIVE Final    Comment: (NOTE) SARS-CoV-2 target nucleic acids are NOT DETECTED.  The SARS-CoV-2 RNA is generally detectable in upper respiratory specimens during the acute phase of infection. The lowest concentration of SARS-CoV-2 viral copies this assay can detect is 138 copies/mL. A negative result does not preclude SARS-Cov-2 infection and should not be used as the sole basis for treatment or other patient management decisions. A negative result may occur with  improper specimen collection/handling, submission of specimen other than nasopharyngeal swab, presence of viral mutation(s) within the areas targeted by this assay, and inadequate number of viral copies(<138 copies/mL). A negative result must be combined with clinical observations, patient history, and epidemiological information. The expected result is Negative.  Fact Sheet for Patients:  BloggerCourse.com  Fact Sheet for Healthcare Providers:  SeriousBroker.it  This test is no t yet approved or cleared by the [place] FDA and  has been authorized for detection and/or diagnosis of SARS-CoV-2 by FDA under an Emergency Use Authorization (EUA). This EUA will remain  in effect (meaning this test can be used) for the duration of the COVID-19  declaration under Section 564(b)(1) of the Act, 21 U.S.C.section 360bbb-3(b)(1), unless the authorization is terminated  or revoked sooner.       Influenza A by PCR NEGATIVE NEGATIVE Final   Influenza B by PCR NEGATIVE NEGATIVE Final    Comment: (NOTE) The Xpert Xpress SARS-CoV-2/FLU/RSV plus assay is intended as an aid in the diagnosis of influenza from Nasopharyngeal swab specimens and should not be used as a sole basis for treatment. Nasal washings and aspirates are unacceptable for Xpert Xpress SARS-CoV-2/FLU/RSV testing.  Fact Sheet for Patients: BloggerCourse.com  Fact Sheet for Healthcare Providers: SeriousBroker.it  This test is not yet approved or cleared by the [place] FDA and has been authorized for detection and/or diagnosis of SARS-CoV-2 by FDA under an Emergency Use Authorization (EUA). This EUA will remain in effect (meaning this test can be used) for the duration of the COVID-19 declaration under Section 564(b)(1) of the Act, 21 U.S.C. section 360bbb-3(b)(1), unless the authorization is terminated or revoked.  Performed at [facility], [address], [place], [place] [address]     Procedures and diagnostic studies:  ECHOCARDIOGRAM COMPLETE  Result Date: 05/12/2021    ECHOCARDIOGRAM REPORT   Patient Name:   AUBURN HERT Date of Exam: 05/12/2021 Medical Rec #:  [id number]           Height:       71.0 in Accession #:    [id number]          Weight:       252.8 lb Date of Birth:  [birth year]           BSA:          2.329 m Patient Age:    59 years            BP:           117/99 mmHg Patient  Gender: M                   HR:           70 bpm. Exam Location:  Inpatient Procedure: 2D Echo, Color Doppler and Cardiac Doppler Indications:    CHF  History:        Patient has prior history of Echocardiogram examinations, most                 recent 12/14/2020. CHF; Risk Factors:Hypertension and  Diabetes.                 05/10/2019 cath.  Sonographer:    Neomia Dear RDCS Referring Phys: [id number] Teddy Spike IMPRESSIONS  1. No left ventriculatr thrombus is seen. Left ventricular ejection fraction, by estimation, is <10%. The left ventricle has severely decreased function. The left ventricle demonstrates global hypokinesis. The left ventricular internal cavity size was severely dilated. Left ventricular diastolic parameters are consistent with Grade II diastolic dysfunction (pseudonormalization). Elevated left atrial pressure.  2. Right ventricular systolic function is severely reduced. The right ventricular size is severely enlarged. There is mildly elevated pulmonary artery systolic pressure.  3. Left atrial size was severely dilated.  4. Right atrial size was severely dilated.  5. The mitral valve is normal in structure. Mild mitral valve regurgitation.  6. Tricuspid valve regurgitation is mild to moderate.  7. The aortic valve is tricuspid. Aortic valve regurgitation is not visualized. No aortic stenosis is present.  8. The inferior vena cava is dilated in size with <50% respiratory variability, suggesting right atrial pressure of 15 mmHg. Comparison(s): No significant change from prior study. Prior images reviewed side by side. The left ventricular diastolic function is unchanged. The right ventricular systolic function is unchanged. IVC was no seen on the previous study; allowing for that, the estimated systolic PA pressure is probably only slightly higher. Diastolic function was not assessd on the previous study. FINDINGS  Left Ventricle: No left ventriculatr thrombus is seen. Left ventricular ejection fraction, by estimation, is <10%. The left ventricle has severely decreased function. The left ventricle demonstrates global hypokinesis. The left ventricular internal cavity size was severely dilated. There is no left ventricular hypertrophy. Left ventricular diastolic parameters are consistent with  Grade II diastolic dysfunction (pseudonormalization). Elevated left atrial pressure. Right Ventricle: The right ventricular size is severely enlarged. No increase in right ventricular wall thickness. Right ventricular systolic function is severely reduced. There is mildly elevated pulmonary artery systolic pressure. The tricuspid regurgitant velocity is 2.53 m/s, and with an assumed right atrial pressure of 15 mmHg, the estimated right ventricular systolic pressure is 40.6 mmHg. Left Atrium: Left atrial size was severely dilated. Right Atrium: Right atrial size was severely dilated. Pericardium: There is no evidence of pericardial effusion. Mitral Valve: The mitral valve is normal in structure. Mild mitral valve regurgitation. Tricuspid Valve: The tricuspid valve is normal in structure. Tricuspid valve regurgitation is mild to moderate. Aortic Valve: The aortic valve is tricuspid. Aortic valve regurgitation is not visualized. Aortic regurgitation PHT measures 412 msec. No aortic stenosis is present. Aortic valve mean gradient measures 2.0 mmHg. Aortic valve peak gradient measures 3.2 mmHg. Aortic valve area, by VTI measures 2.78 cm. Pulmonic Valve: The pulmonic valve was grossly normal. Pulmonic valve regurgitation is mild. Aorta: The aortic root is normal in size and structure. Venous: The inferior vena cava is dilated in size with less than 50% respiratory variability, suggesting right atrial pressure of 15 mmHg.  IAS/Shunts: No atrial level shunt detected by color flow Doppler.  LEFT VENTRICLE PLAX 2D LVIDd:         7.80 cm      Diastology LVIDs:         7.70 cm      LV e' medial:    3.26 cm/s LV PW:         1.20 cm      LV E/e' medial:  24.3 LV IVS:        1.20 cm      LV e' lateral:   4.95 cm/s LVOT diam:     2.50 cm      LV E/e' lateral: 16.0 LV SV:         45 LV SV Index:   19 LVOT Area:     4.91 cm  LV Volumes (MOD) LV vol d, MOD A2C: 350.0 ml LV vol d, MOD A4C: 254.0 ml LV vol s, MOD A2C: 259.0 ml LV vol  s, MOD A4C: 222.0 ml LV SV MOD A2C:     91.0 ml LV SV MOD A4C:     254.0 ml LV SV MOD BP:      54.5 ml RIGHT VENTRICLE RV Basal diam:  5.00 cm RV Mid diam:    2.70 cm RV S prime:     7.37 cm/s TAPSE (M-mode): 0.5 cm LEFT ATRIUM              Index       RIGHT ATRIUM           Index LA diam:        5.20 cm  2.23 cm/m  RA Area:     40.10 cm LA Vol (A2C):   131.0 ml 56.24 ml/m RA Volume:   175.00 ml 75.13 ml/m LA Vol (A4C):   126.0 ml 54.09 ml/m LA Biplane Vol: 131.0 ml 56.24 ml/m  AORTIC VALVE                   PULMONIC VALVE AV Area (Vmax):    3.15 cm    PV Vmax:       0.57 m/s AV Area (Vmean):   2.81 cm    PV Vmean:      39.800 cm/s AV Area (VTI):     2.78 cm    PV VTI:        0.098 m AV Vmax:           89.60 cm/s  PV Peak grad:  1.3 mmHg AV Vmean:          61.150 cm/s PV Mean grad:  1.0 mmHg AV VTI:            0.161 m AV Peak Grad:      3.2 mmHg AV Mean Grad:      2.0 mmHg LVOT Vmax:         57.45 cm/s LVOT Vmean:        35.000 cm/s LVOT VTI:          0.091 m LVOT/AV VTI ratio: 0.57 AI PHT:            412 msec  AORTA Ao Root diam: 3.70 cm Ao Asc diam:  3.60 cm MITRAL VALVE               TRICUSPID VALVE MV Area (PHT): 5.75 cm    TR Peak grad:   25.6 mmHg MV Decel Time: 132 msec    TR Vmax:  253.00 cm/s MR Peak grad: 65.3 mmHg MR Mean grad: 44.0 mmHg    SHUNTS MR Vmax:      404.00 cm/s  Systemic VTI:  0.09 m MR Vmean:     318.5 cm/s   Systemic Diam: 2.50 cm MV E velocity: 79.30 cm/s MV A velocity: 48.10 cm/s MV E/A ratio:  1.65 Mihai Croitoru MD Electronically signed by Thurmon Fair MD Signature Date/Time: 05/12/2021/1:59:37 PM    Final                LOS: 3 days   Kelsee Preslar  [facility]   Pager on [email or web address]. If 7PM-7AM, please contact night-coverage at [email or web address]     05/14/2021, 8:50 AM

## 2021-05-15 DIAGNOSIS — I5043 Acute on chronic combined systolic (congestive) and diastolic (congestive) heart failure: Secondary | ICD-10-CM | POA: Diagnosis not present

## 2021-05-15 LAB — BASIC METABOLIC PANEL
Anion gap: 8 (ref 5–15)
BUN: 53 mg/dL — ABNORMAL HIGH (ref 6–20)
CO2: 23 mmol/L (ref 22–32)
Calcium: 8.6 mg/dL — ABNORMAL LOW (ref 8.9–10.3)
Chloride: 107 mmol/L (ref 98–111)
Creatinine, Ser: 1.65 mg/dL — ABNORMAL HIGH (ref 0.61–1.24)
GFR, Estimated: 48 mL/min — ABNORMAL LOW (ref >60)
Glucose, Bld: 124 mg/dL — ABNORMAL HIGH (ref 70–99)
Potassium: 4 mmol/L (ref 3.5–5.1)
Sodium: 138 mmol/L (ref 135–145)

## 2021-05-15 LAB — BLOOD GAS, ARTERIAL
Acid-base deficit: 0.2 mmol/L (ref 0.0–2.0)
Bicarbonate: 22.2 mmol/L (ref 20.0–28.0)
Drawn by: 25788
FIO2: 36
O2 Content: 4 L/min
O2 Saturation: 96.5 %
Patient temperature: 98.6
pCO2 arterial: 30.5 mmHg — ABNORMAL LOW (ref 32.0–48.0)
pH, Arterial: 7.476 — ABNORMAL HIGH (ref 7.350–7.450)
pO2, Arterial: 84.9 mmHg (ref 83.0–108.0)

## 2021-05-15 LAB — MAGNESIUM: Magnesium: 2.2 mg/dL (ref 1.7–2.4)

## 2021-05-15 NOTE — Plan of Care (Signed)
  Problem: Clinical Measurements: Goal: Cardiovascular complication will be avoided Outcome: Progressing   Problem: Coping: Goal: Level of anxiety will decrease Outcome: Progressing   Problem: Clinical Measurements: Goal: Respiratory complications will improve Outcome: Not Progressing   

## 2021-05-15 NOTE — Progress Notes (Signed)
Notified Lab that ABG being sent for analysis. 

## 2021-05-15 NOTE — Progress Notes (Addendum)
Progress Note    Dale [name]  BWL:[id number] DOB: 31-Jan-1961  DOA: 05/11/2021 PCP: Vesta Mixer, MD      Brief Narrative:    Medical records reviewed and are as summarized below:  Dale [name] is a 60 y.o. male with medical history significant for combined chronic systolic and diastolic CHF, cocaine use disorder, alcohol use disorder, hypertension, who presented to the hospital because of shortness of breath, orthopnea and increasing leg swelling for about 2 weeks duration.  He was admitted to the hospital for acute exacerbation of chronic diastolic and systolic CHF.    Assessment/Plan:   Principal Problem:   Acute exacerbation of CHF (congestive heart failure) (HCC) Active Problems:   Essential hypertension   Acute pulmonary edema (HCC)   Cocaine abuse (HCC)   Chronic kidney disease, stage II (mild)   Hyperlipidemia   Coronary artery disease involving native coronary artery of native heart without angina pectoris   Hypokalemia   Body mass index is 36.44 kg/m.  (Morbid obesity)  Acute exacerbation of chronic systolic and diastolic CHF: Continue IV Lasix.  Monitor daily weights, urine output and BMP.  ABG done today showed pH of 7.476, PCO2 30.5, PO2 84.9 on 2 L of oxygen.  It is unclear whether patient has undiagnosed sleep apnea.  Use CPAP as needed for sleep.   2D echo on 05/12/2021 showed EF less than 10%, severely dilated LV and RV, grade 2 diastolic dysfunction, severely dilated left and right atria  Hypokalemia: Improved.  Continue to monitor  Hypertension: Continue antihypertensives  CKD stage IIIa: Creatinine is stable.  Anxiety/insomnia: Ativan as needed for anxiety and trazodone as needed for sleep.  Alcohol and cocaine use disorder: Counseled to quit using alcohol and cocaine.  Medical nonadherence: The importance of medical adherence was reiterated.  Prognosis is poor.  Consulted palliative care team.   Diet Order              Diet Heart Room service appropriate? Yes; Fluid consistency: Thin; Fluid restriction: 1200 mL Fluid  Diet effective now                      Consultants: None  Procedures: None    Medications:    aspirin EC  81 mg Oral Daily   atorvastatin  40 mg Oral Daily   carvedilol  6.25 mg Oral BID WC   enoxaparin (LOVENOX) injection  40 mg Subcutaneous Q24H   ferrous sulfate  325 mg Oral Q breakfast   furosemide  40 mg Intravenous Q12H   gabapentin  100 mg Oral TID   losartan  12.5 mg Oral Daily   pantoprazole  40 mg Oral Daily   Continuous Infusions:   Anti-infectives (From admission, onward)    None              Family Communication/Anticipated D/C date and plan/Code Status   DVT prophylaxis: enoxaparin (LOVENOX) injection 40 mg Start: 05/11/21 2045     Code Status: Full Code  Family Communication: None Disposition Plan:    Status is: Inpatient  Remains inpatient appropriate because:IV treatments appropriate due to intensity of illness or inability to take PO and Inpatient level of care appropriate due to severity of illness  Dispo: The patient is from: Home              Anticipated d/c is to: Home              Patient currently  is not medically stable to d/c.   Difficult to place patient No           Subjective:   Interval events noted.  He said he had a rough night last night.  He still feels short of breath although he feels a little better today.  His nurse said that she observed patient having apneic episodes while he was sleeping.  Objective:    Vitals:   05/14/21 2125 05/14/21 2215 05/15/21 0400 05/15/21 0459  BP: 106/76 129/89  108/75  Pulse: 83 90  77  Resp: 20 (!) 22    Temp: (!) 97.5 F (36.4 C) 97.8 F (36.6 C)  97.8 F (36.6 C)  TempSrc: Axillary Oral  Axillary  SpO2: 100% 100%  95%  Weight:   118.5 kg   Height:       No data found.   Intake/Output Summary (Last 24 hours) at 05/15/2021 1056 Last data filed at  05/15/2021 0400 Gross per 24 hour  Intake 960 ml  Output 2050 ml  Net -1090 ml   Filed Weights   05/13/21 0500 05/14/21 0500 05/15/21 0400  Weight: 115.6 kg 118 kg 118.5 kg    Exam:  GEN: NAD SKIN: Warm and dry EYES: No pallor or icterus ENT: MMM CV: RRR PULM: CTA B ABD: soft, ND, NT, +BS CNS: AAO x 3, non focal EXT: Bilateral leg edema 1+, no tenderness          Data Reviewed:   I have personally reviewed following labs and imaging studies:  Labs: Labs show the following:   Basic Metabolic Panel: Recent Labs  Lab 05/11/21 0825 05/11/21 2014 05/12/21 0447 05/13/21 1457 05/14/21 0424 05/15/21 0423  NA 139  --  142  --  138 138  K 3.2*  --  3.1* 3.7 3.9 4.0  CL 102  --  103  --  105 107  CO2 27  --  29  --  23 23  GLUCOSE 107*  --  141*  --  120* 124*  BUN 36*  --  40*  --  52* 53*  CREATININE 1.48*  --  1.61*  --  1.78* 1.65*  CALCIUM 8.6*  --  8.8*  --  9.2 8.6*  MG  --  2.2  --  2.2  --  2.2   GFR Estimated Creatinine Clearance: 64.7 mL/min (A) (by C-G formula based on SCr of 1.65 mg/dL (H)). Liver Function Tests: Recent Labs  Lab 05/11/21 0825 05/12/21 0447  AST 24 22  ALT 20 19  ALKPHOS 78 72  BILITOT 1.4* 1.2  PROT 7.3 6.5  ALBUMIN 3.6 3.2*   No results for input(s): LIPASE, AMYLASE in the last 168 hours. No results for input(s): AMMONIA in the last 168 hours. Coagulation profile No results for input(s): INR, PROTIME in the last 168 hours.  CBC: Recent Labs  Lab 05/11/21 0825 05/12/21 0447  WBC 6.6 6.3  NEUTROABS 4.9  --   HGB 13.2 12.4*  HCT 41.3 38.1*  MCV 88.2 87.6  PLT 296 283   Cardiac Enzymes: No results for input(s): CKTOTAL, CKMB, CKMBINDEX, TROPONINI in the last 168 hours. BNP (last 3 results) No results for input(s): PROBNP in the last 8760 hours. CBG: No results for input(s): GLUCAP in the last 168 hours. D-Dimer: No results for input(s): DDIMER in the last 72 hours. Hgb A1c: No results for input(s): HGBA1C  in the last 72 hours.  Lipid Profile: No results for input(s): CHOL,  HDL, LDLCALC, TRIG, CHOLHDL, LDLDIRECT in the last 72 hours. Thyroid function studies: No results for input(s): TSH, T4TOTAL, T3FREE, THYROIDAB in the last 72 hours.  Invalid input(s): FREET3 Anemia work up: No results for input(s): VITAMINB12, FOLATE, FERRITIN, TIBC, IRON, RETICCTPCT in the last 72 hours. Sepsis Labs: Recent Labs  Lab 05/11/21 0825 05/12/21 0447  WBC 6.6 6.3    Microbiology Recent Results (from the past 240 hour(s))  Resp Panel by RT-PCR (Flu A&B, Covid) Nasopharyngeal Swab     Status: None   Collection Time: 05/11/21  2:25 PM   Specimen: Nasopharyngeal Swab; Nasopharyngeal(NP) swabs in vial transport medium  Result Value Ref Range Status   SARS Coronavirus 2 by RT PCR NEGATIVE NEGATIVE Final    Comment: (NOTE) SARS-CoV-2 target nucleic acids are NOT DETECTED.  The SARS-CoV-2 RNA is generally detectable in upper respiratory specimens during the acute phase of infection. The lowest concentration of SARS-CoV-2 viral copies this assay can detect is 138 copies/mL. A negative result does not preclude SARS-Cov-2 infection and should not be used as the sole basis for treatment or other patient management decisions. A negative result may occur with  improper specimen collection/handling, submission of specimen other than nasopharyngeal swab, presence of viral mutation(s) within the areas targeted by this assay, and inadequate number of viral copies(<138 copies/mL). A negative result must be combined with clinical observations, patient history, and epidemiological information. The expected result is Negative.  Fact Sheet for Patients:  BloggerCourse.com  Fact Sheet for Healthcare Providers:  SeriousBroker.it  This test is no t yet approved or cleared by the [place] FDA and  has been authorized for detection and/or diagnosis of SARS-CoV-2  by FDA under an Emergency Use Authorization (EUA). This EUA will remain  in effect (meaning this test can be used) for the duration of the COVID-19 declaration under Section 564(b)(1) of the Act, 21 U.S.C.section 360bbb-3(b)(1), unless the authorization is terminated  or revoked sooner.       Influenza A by PCR NEGATIVE NEGATIVE Final   Influenza B by PCR NEGATIVE NEGATIVE Final    Comment: (NOTE) The Xpert Xpress SARS-CoV-2/FLU/RSV plus assay is intended as an aid in the diagnosis of influenza from Nasopharyngeal swab specimens and should not be used as a sole basis for treatment. Nasal washings and aspirates are unacceptable for Xpert Xpress SARS-CoV-2/FLU/RSV testing.  Fact Sheet for Patients: BloggerCourse.com  Fact Sheet for Healthcare Providers: SeriousBroker.it  This test is not yet approved or cleared by the [place] FDA and has been authorized for detection and/or diagnosis of SARS-CoV-2 by FDA under an Emergency Use Authorization (EUA). This EUA will remain in effect (meaning this test can be used) for the duration of the COVID-19 declaration under Section 564(b)(1) of the Act, 21 U.S.C. section 360bbb-3(b)(1), unless the authorization is terminated or revoked.  Performed at [facility], [address] [place], [place] [address]     Procedures and diagnostic studies:  No results found.             LOS: 4 days   Dale [name]  [facility]   Pager on [email or web address]. If 7PM-7AM, please contact night-coverage at [email or web address]     05/15/2021, 10:56 AM

## 2021-05-16 DIAGNOSIS — F1410 Cocaine abuse, uncomplicated: Secondary | ICD-10-CM | POA: Diagnosis not present

## 2021-05-16 DIAGNOSIS — J9601 Acute respiratory failure with hypoxia: Secondary | ICD-10-CM

## 2021-05-16 DIAGNOSIS — Z7189 Other specified counseling: Secondary | ICD-10-CM | POA: Diagnosis not present

## 2021-05-16 DIAGNOSIS — Z515 Encounter for palliative care: Secondary | ICD-10-CM

## 2021-05-16 DIAGNOSIS — I5023 Acute on chronic systolic (congestive) heart failure: Secondary | ICD-10-CM | POA: Diagnosis not present

## 2021-05-16 DIAGNOSIS — I5043 Acute on chronic combined systolic (congestive) and diastolic (congestive) heart failure: Secondary | ICD-10-CM | POA: Diagnosis not present

## 2021-05-16 LAB — BASIC METABOLIC PANEL
Anion gap: 8 (ref 5–15)
BUN: 58 mg/dL — ABNORMAL HIGH (ref 6–20)
CO2: 27 mmol/L (ref 22–32)
Calcium: 8.7 mg/dL — ABNORMAL LOW (ref 8.9–10.3)
Chloride: 103 mmol/L (ref 98–111)
Creatinine, Ser: 1.79 mg/dL — ABNORMAL HIGH (ref 0.61–1.24)
GFR, Estimated: 44 mL/min — ABNORMAL LOW (ref >60)
Glucose, Bld: 116 mg/dL — ABNORMAL HIGH (ref 70–99)
Potassium: 3.5 mmol/L (ref 3.5–5.1)
Sodium: 138 mmol/L (ref 135–145)

## 2021-05-16 LAB — MAGNESIUM: Magnesium: 2.3 mg/dL (ref 1.7–2.4)

## 2021-05-16 MED ORDER — LORAZEPAM 2 MG/ML IJ SOLN
0.5000 mg | Freq: Once | INTRAMUSCULAR | Status: AC
  Administered 2021-05-16: 0.5 mg via INTRAVENOUS
  Filled 2021-05-16: qty 1

## 2021-05-16 NOTE — Progress Notes (Signed)
Pt refused to wear CPAP. He stated that "something is not right! its suffocating me". Pt has been agitated and anxious while wearing it for a short period of time. Pt education provided but however insist not to wear it. RN placed pt back to 4L O2 HFNC. RT and NP Daniel notifed. Will continue to monitor

## 2021-05-16 NOTE — Plan of Care (Signed)
  Problem: Clinical Measurements: Goal: Respiratory complications will improve Outcome: Progressing Goal: Cardiovascular complication will be avoided Outcome: Progressing   Problem: Coping: Goal: Level of anxiety will decrease Outcome: Progressing   Problem: Education: Goal: Ability to demonstrate management of disease process will improve Outcome: Not Progressing   Problem: Activity: Goal: Capacity to carry out activities will improve Outcome: Adequate for Discharge

## 2021-05-16 NOTE — Consult Note (Signed)
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  Asked to see for heart failure management  HPI   Dale [name] is a 60 y.o. male who is being seen today for the evaluation of congestive heart failure at the request of Dr. Dayna Barker. 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  Asked to see for heart failure management.  He was recently hospitalized in February 2022 at [facility] with heart failure after previous multiple hospitalizations for heart failure in the setting of poor compliance and continued cocaine use.  LVEF was less than 20%.  He was scheduled for transition of care follow-up in heart failure.  He did not show for his heart failure clinic appointment on February 12, 2021.  He does report cocaine use within the past week.  He says he has been out of his medications for the past month.  Admission labs show an elevated BNP of 3761.  Troponin has been flat elevated at 40 and 36.  I's and O's may not be accurate as it demonstrates he is net positive.  PMHx   Past Medical History:  Diagnosis Date   Alcohol abuse    Chronic combined systolic and diastolic CHF (congestive heart failure) (HCC)    a. 09/27/18 showed mild LVH, EF 20-25%, grade 2 DD, mild MR,  severely dilated LA, mildly dilated RV with mildly reduced RV function, mod RAE.   Cocaine abuse (HCC)    Hypertension     Past Surgical History:  Procedure Laterality Date   RIGHT/LEFT HEART CATH AND CORONARY ANGIOGRAPHY N/A 05/10/2019   Procedure: RIGHT/LEFT HEART CATH AND CORONARY ANGIOGRAPHY;  Surgeon: Yvonne Kendall, MD;  Location: [facility] INVASIVE CV LAB;  Service: Cardiovascular;  Laterality: N/A;    FAMHx   Family History  Problem Relation Age of Onset   Hypertension Maternal Grandmother     SOCHx    reports that he has never smoked. He has never used smokeless tobacco. He reports current alcohol use. He reports current drug use. Frequency: 3.00 times per week. Drugs: Marijuana and Cocaine.  Outpatient Medications   No current facility-administered medications on file prior to encounter.   Current Outpatient Medications on File Prior to Encounter  Medication Sig Dispense Refill   aspirin EC 81 MG EC tablet Take 1 tablet (81 mg total) by mouth daily. Swallow whole. 30 tablet 11   atorvastatin (LIPITOR) 40 MG tablet Take 1 tablet (40 mg total) by mouth daily. 90 tablet 0   carvedilol (COREG) 6.25 MG tablet Take 1 tablet (6.25 mg total) by mouth 2 (two) times daily with a meal. 60 tablet 0   ferrous sulfate 325 (65 FE) MG EC tablet Take 325 mg by mouth daily with breakfast.     isosorbide-hydrALAZINE (BIDIL) 20-37.5 MG tablet Take 1 tablet by mouth 3 (three) times daily. 90 tablet 0   losartan (COZAAR) 25 MG  tablet Take 0.5 tablets (12.5 mg total) by mouth daily. 30 tablet 0   torsemide 40 MG TABS Take 40 mg by mouth 2 (two) times daily. 120 tablet 2   diclofenac sodium (VOLTAREN) 1 % GEL Apply 2 g topically 4 (four) times daily as needed (pain).     gabapentin (NEURONTIN) 300 MG capsule Take 1 capsule (300 mg total) by mouth at bedtime. (Patient not taking: Reported on 05/11/2021) 30 capsule 0   pantoprazole (PROTONIX) 40 MG tablet Take 1 tablet (40 mg total) by mouth daily.  (Patient not taking: No sig reported) 30 tablet 0   spironolactone (ALDACTONE) 25 MG tablet Take 1 tablet (25 mg total) by mouth 2 (two) times daily. (Patient not taking: Reported on 05/11/2021) 60 tablet 0    Inpatient Medications    Scheduled Meds:  aspirin EC  81 mg Oral Daily   atorvastatin  40 mg Oral Daily   carvedilol  6.25 mg Oral BID WC   enoxaparin (LOVENOX) injection  40 mg Subcutaneous Q24H   ferrous sulfate  325 mg Oral Q breakfast   furosemide  40 mg Intravenous Q12H   gabapentin  100 mg Oral TID   losartan  12.5 mg Oral Daily   pantoprazole  40 mg Oral Daily    Continuous Infusions:   PRN Meds: acetaminophen **OR** acetaminophen, traZODone   ALLERGIES   Allergies  Allergen Reactions   Hydrocodone Itching    ROS   Pertinent items noted in HPI and remainder of comprehensive ROS otherwise negative.  Vitals   Vitals:   05/15/21 2115 05/15/21 2259 05/16/21 0602 05/16/21 1217  BP: 107/82  96/62 103/82  Pulse: 84 80 80 63  Resp: 20 20 20  (!) 24  Temp: (!) 97.4 F (36.3 C)  98.3 F (36.8 C) 98.2 F (36.8 C)  TempSrc: Oral   Oral  SpO2: 100% 96% 97% 95%  Weight:      Height:        Intake/Output Summary (Last 24 hours) at 05/16/2021 1406 Last data filed at 05/16/2021 0744 Gross per 24 hour  Intake 1200 ml  Output 750 ml  Net 450 ml   Filed Weights   05/13/21 0500 05/14/21 0500 05/15/21 0400  Weight: 115.6 kg 118 kg 118.5 kg    Physical Exam   General appearance: alert, no distress, and moderately obese Neck: JVD - 3 cm above sternal notch, no carotid bruit, and thyroid not enlarged, symmetric, no tenderness/mass/nodules Lungs: clear to auscultation bilaterally Heart: regular rate and rhythm Abdomen: soft, non-tender; bowel sounds normal; no masses,  no organomegaly and obese Extremities: edema trace bilateral pedal Pulses: 2+ and symmetric Skin: Skin color, texture, turgor normal. No rashes or lesions Neurologic: Mental status: Alert,  oriented, thought content appropriate Psych: Pleasant  Labs   Results for orders placed or performed during the hospital encounter of 05/11/21 (from the past 48 hour(s))  Basic metabolic panel     Status: Abnormal   Collection Time: 05/15/21  4:23 AM  Result Value Ref Range   Sodium 138 135 - 145 mmol/L   Potassium 4.0 3.5 - 5.1 mmol/L   Chloride 107 98 - 111 mmol/L   CO2 23 22 - 32 mmol/L   Glucose, Bld 124 (H) 70 - 99 mg/dL    Comment: Glucose reference range applies only to samples taken after fasting for at least 8 hours.   BUN 53 (H) 6 - 20 mg/dL   Creatinine, Ser 8.88 (H) 0.61 - 1.24 mg/dL  Calcium 8.6 (L) 8.9 - 10.3 mg/dL   GFR, Estimated 48 (L) >60 mL/min    Comment: (NOTE) Calculated using the CKD-EPI Creatinine Equation (2021)    Anion gap 8 5 - 15    Comment: Performed at [facility], [address] [place], [place] [address]  Magnesium     Status: None   Collection Time: 05/15/21  4:23 AM  Result Value Ref Range   Magnesium 2.2 1.7 - 2.4 mg/dL    Comment: Performed at [facility], [address] [place], [place] [address]  Blood gas, arterial     Status: Abnormal   Collection Time: 05/15/21 12:13 PM  Result Value Ref Range   FIO2 36.00    O2 Content 4.0 L/min   pH, Arterial 7.476 (H) 7.350 - 7.450   pCO2 arterial 30.5 (L) 32.0 - 48.0 mmHg   pO2, Arterial 84.9 83.0 - 108.0 mmHg   Bicarbonate 22.2 20.0 - 28.0 mmol/L   Acid-base deficit 0.2 0.0 - 2.0 mmol/L   O2 Saturation 96.5 %   Patient temperature 98.6    Collection site RIGHT RADIAL    Drawn by [id number]    Sample type ARTERIAL     Comment: Performed at [facility], [address], [place], [place] [address]  Basic metabolic panel     Status: Abnormal   Collection Time: 05/16/21  4:26 AM  Result Value Ref Range   Sodium 138 135 - 145 mmol/L   Potassium 3.5 3.5 - 5.1 mmol/L   Chloride 103 98 - 111 mmol/L   CO2 27 22 - 32 mmol/L   Glucose, Bld  116 (H) 70 - 99 mg/dL    Comment: Glucose reference range applies only to samples taken after fasting for at least 8 hours.   BUN 58 (H) 6 - 20 mg/dL   Creatinine, Ser 4.27 (H) 0.61 - 1.24 mg/dL   Calcium 8.7 (L) 8.9 - 10.3 mg/dL   GFR, Estimated 44 (L) >60 mL/min    Comment: (NOTE) Calculated using the CKD-EPI Creatinine Equation (2021)    Anion gap 8 5 - 15    Comment: Performed at [facility], [address], [place], [place] [address]  Magnesium     Status: None   Collection Time: 05/16/21  4:26 AM  Result Value Ref Range   Magnesium 2.3 1.7 - 2.4 mg/dL    Comment: Performed at [facility], [address] [place], [place] [address]    ECG   Sinus rhythm at 98, ventricular trigemeny - Personally Reviewed  Telemetry   N/A  Radiology   No results found.  Cardiac Studies   Patient Name:   Dale [name] Date of Exam: 05/12/2021  Medical Rec #:  [id number]           Height:       71.0 in  Accession #:    [id number]          Weight:       252.8 lb  Date of Birth:  01/23/1961           BSA:          2.329 m  Patient Age:    59 years            BP:           117/99 mmHg  Patient Gender: M  HR:           70 bpm.  Exam Location:  Inpatient   Procedure: 2D Echo, Color Doppler and Cardiac Doppler   Indications:    CHF     History:        Patient has prior history of Echocardiogram examinations,  most                  recent 12/14/2020. CHF; Risk Factors:Hypertension and  Diabetes.                  05/10/2019 cath.     Sonographer:    Neomia Dear RDCS  Referring Phys: [id number] Teddy Spike   IMPRESSIONS     1. No left ventriculatr thrombus is seen. Left ventricular ejection  fraction, by estimation, is <10%. The left ventricle has severely  decreased function. The left ventricle demonstrates global hypokinesis.  The left ventricular internal cavity size was  severely dilated. Left ventricular diastolic  parameters are consistent  with Grade II diastolic dysfunction (pseudonormalization). Elevated left  atrial pressure.   2. Right ventricular systolic function is severely reduced. The right  ventricular size is severely enlarged. There is mildly elevated pulmonary  artery systolic pressure.   3. Left atrial size was severely dilated.   4. Right atrial size was severely dilated.   5. The mitral valve is normal in structure. Mild mitral valve  regurgitation.   6. Tricuspid valve regurgitation is mild to moderate.   7. The aortic valve is tricuspid. Aortic valve regurgitation is not  visualized. No aortic stenosis is present.   8. The inferior vena cava is dilated in size with <50% respiratory  variability, suggesting right atrial pressure of 15 mmHg.   Comparison(s): No significant change from prior study. Prior images  reviewed side by side. The left ventricular diastolic function is  unchanged. The right ventricular systolic function is unchanged. IVC was  no seen on the previous study; allowing for  that, the estimated systolic PA pressure is probably only slightly higher.  Diastolic function was not assessd on the previous study.   Impression   Principal Problem:   Acute exacerbation of CHF (congestive heart failure) (HCC) Active Problems:   Essential hypertension   Acute pulmonary edema (HCC)   Cocaine abuse (HCC)   Chronic kidney disease, stage II (mild)   Hyperlipidemia   Coronary artery disease involving native coronary artery of native heart without angina pectoris   Hypokalemia   Recommendation   Dale [name] has acute on chronic systolic heart failure with severe non-ischemic cardiomyopathy and LVEF less than 10%.  He has severe biatrial enlargement.  Unfortunately prognosis is poor as he continues to use cocaine. Not a candidate for any advanced procedures, devices, LVAD or transplant. He has been noncompliant with medications.  He has had medications reestablished  here and is diuresing.  He does feel better.  Ultimately the treatment is reestablishing medications which need to be continued and substance abuse cessation.  No further recommendations at this time.  Thanks for the consultation.  Cardiology will follow with you.  Time Spent Directly with Patient:  I have spent a total of 45 minutes with the patient reviewing hospital notes, telemetry, EKGs, labs and examining the patient as well as establishing an assessment and plan that was discussed personally with the patient.  > 50% of time was spent in direct patient care.  Length of Stay:  LOS: 5 days   Chrystie Nose, MD,  Milagros LollFACC, FACP  [facility]  Medical Director of the Advanced Lipid Disorders &  Cardiovascular Risk Reduction Clinic Diplomate of the American Board of Clinical Lipidology Attending Cardiologist  Direct Dial: [phone]  Fax: [phone]  Website:  [email or web address]   Sebastien Jackson C Linnaea Ahn 05/16/2021, 2:06 PM

## 2021-05-16 NOTE — Progress Notes (Signed)
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  Repeat echo showed EF less than 10% 05/12/21, GIIdd. Non complaints. Cont in iv lasix, intake and output monitoring, 2 g salt and fluid restricted diet. Consulted cards 2/2 lvef < 10%. Cont losartan 12.5, coreg 6.25 and goal directed therapy. Intake/Output Summary (Last 24 hours) at 05/16/2021 0721 Last data filed at 05/16/2021 0600 Gross per 24 hour  Intake 1320 ml  Output 1250 ml  Net 70 ml   Filed Weights   05/13/21 0500 05/14/21 0500 05/15/21 0400  Weight: 115.6 kg 118 kg 118.5 kg    Hypokalemia resolved  Essential hypertension: stable On Coreg 6.25 and losartan 12.5  Cocaine abuse/tobacco abuse:cessation discussed before  CKD stage IIIa creatinine up, monitor  as he is on lasix and other meds Recent Labs  Lab 05/11/21 0825 05/12/21 0447 05/14/21 0424 05/15/21 0423 05/16/21 0426  BUN 36* 40* 52* 53* 58*  CREATININE 1.48* 1.61* 1.78* 1.65* 1.79*   HLD- cont statins   Medical noncompliance- discussed in detail for compliance  Morbid obesity BMI 36 OSA. Refused CPAP  Diet Order             Diet Heart Room service appropriate? Yes; Fluid consistency: Thin; Fluid restriction: 1200 mL Fluid  Diet effective now                    Patient's Body mass index is 36.44 kg/m.  DVT prophylaxis: enoxaparin (LOVENOX) injection 40 mg Start: 05/11/21 2045 Code Status:   Code Status: Full Code  Family Communication: plan of care discussed with patient at bedside. Status is: Inpatient Remains inpatient appropriate because:IV treatments appropriate due to intensity of illness or inability to take PO and Inpatient level of care appropriate due to severity of illness Dispo: The patient is from: Home              Anticipated d/c is to: Home in 1-2 days              Patient currently is not medically stable to d/c.   Difficult to place patient No Unresulted Labs (From admission, onward)     Start     Ordered   05/18/21 0500  Creatinine, serum  (enoxaparin (LOVENOX)    CrCl >/= 30 ml/min)  Weekly,   R     Comments: while on enoxaparin therapy    05/11/21 1947           Medications reviewed:  Scheduled Meds:  aspirin EC  81 mg Oral Daily   atorvastatin  40 mg Oral Daily   carvedilol  6.25 mg Oral BID WC   enoxaparin (LOVENOX) injection  40 mg Subcutaneous Q24H   ferrous sulfate  325 mg Oral Q breakfast   furosemide  40 mg Intravenous Q12H   gabapentin  100 mg Oral TID   losartan  12.5 mg Oral Daily   pantoprazole  40 mg Oral Daily   Continuous Infusions: Consultants:see note  Procedures:see note Antimicrobials: Anti-infectives (From admission, onward)    None      Culture/Microbiology No results found for: SDES, SPECREQUEST, CULT, REPTSTATUS  Other culture-see note  Objective: Vitals: Today's Vitals   05/15/21 2115 05/15/21 2248 05/15/21 2259 05/16/21 0602  BP: 107/82   96/62  Pulse: 84  80 80  Resp: 20  20 20   Temp: (!) 97.4 F (36.3 C)   98.3 F (36.8 C)  TempSrc: Oral     SpO2: 100%  96% 97%  Weight:      Height:      PainSc:  0-No pain      Intake/Output Summary (Last 24 hours) at 05/16/2021 0716 Last data filed at 05/16/2021 0600 Gross per 24 hour  Intake 1320 ml  Output  1250 ml  Net 70 ml   Filed Weights   05/13/21 0500 05/14/21 0500 05/15/21 0400  Weight: 115.6 kg 118 kg 118.5 kg   Weight change:   Intake/Output from previous day: 07/12 0701 - 07/13 0700 In: 1320 [P.O.:1320] Out: 1250 [Urine:1250] Intake/Output this shift: No intake/output data recorded. Filed Weights   05/13/21 0500 05/14/21 0500 05/15/21 0400  Weight: 115.6 kg 118 kg 118.5 kg   Examination: General exam: AAO x3, obese, HEENT:Oral mucosa moist, Ear/Nose WNL grossly,dentition normal. Respiratory system: bilaterally clear, no use of accessory muscle, non tender. Cardiovascular system: S1 & S2 +,no murmur Gastrointestinal system: Abdomen soft, NT,ND, BS+. Nervous System:Alert, awake, moving extremities Extremities: B/L LE ++, distal peripheral pulses palpable.  Skin: No rashes,no icterus. MSK: Normal muscle bulk,tone, power Data Reviewed: I have personally reviewed following labs and imaging studies CBC: Recent Labs  Lab 05/11/21 0825 05/12/21 0447  WBC 6.6 6.3  NEUTROABS 4.9  --   HGB 13.2 12.4*  HCT 41.3 38.1*  MCV 88.2 87.6  PLT 296 283   Basic Metabolic Panel: Recent Labs  Lab 05/11/21 0825 05/11/21 2014 05/12/21 0447 05/13/21 1457 05/14/21 0424 05/15/21 0423 05/16/21 0426  NA 139  --  142  --  138 138 138  K 3.2*  --  3.1* 3.7 3.9 4.0 3.5  CL 102  --  103  --  105 107 103  CO2 27  --  29  --  23 23 27   GLUCOSE 107*  --  141*  --  120* 124* 116*  BUN 36*  --  40*  --  52* 53* 58*  CREATININE 1.48*  --  1.61*  --  1.78* 1.65* 1.79*  CALCIUM 8.6*  --  8.8*  --  9.2 8.6* 8.7*  MG  --  2.2  --  2.2  --  2.2 2.3   GFR: Estimated Creatinine Clearance: 59.6 mL/min (A) (by C-G formula based on SCr of 1.79 mg/dL (H)). Liver Function Tests: Recent Labs  Lab 05/11/21 0825 05/12/21 0447  AST 24 22  ALT 20 19  ALKPHOS 78 72  BILITOT 1.4* 1.2  PROT 7.3 6.5  ALBUMIN 3.6 3.2*   No results for input(s): LIPASE, AMYLASE in the last 168 hours. No results  for input(s): AMMONIA in the last 168 hours. Coagulation Profile: No results for input(s): INR, PROTIME in the last 168 hours. Cardiac Enzymes: No results for input(s): CKTOTAL, CKMB, CKMBINDEX, TROPONINI in the last 168 hours. BNP (last 3 results) No results for input(s): PROBNP in the last 8760  hours. HbA1C: No results for input(s): HGBA1C in the last 72 hours. CBG: No results for input(s): GLUCAP in the last 168 hours. Lipid Profile: No results for input(s): CHOL, HDL, LDLCALC, TRIG, CHOLHDL, LDLDIRECT in the last 72 hours. Thyroid Function Tests: No results for input(s): TSH, T4TOTAL, FREET4, T3FREE, THYROIDAB in the last 72 hours. Anemia Panel: No results for input(s): VITAMINB12, FOLATE, FERRITIN, TIBC, IRON, RETICCTPCT in the last 72 hours. Sepsis Labs: No results for input(s): PROCALCITON, LATICACIDVEN in the last 168 hours.  Recent Results (from the past 240 hour(s))  Resp Panel by RT-PCR (Flu A&B, Covid) Nasopharyngeal Swab     Status: None   Collection Time: 05/11/21  2:25 PM   Specimen: Nasopharyngeal Swab; Nasopharyngeal(NP) swabs in vial transport medium  Result Value Ref Range Status   SARS Coronavirus 2 by RT PCR NEGATIVE NEGATIVE Final    Comment: (NOTE) SARS-CoV-2 target nucleic acids are NOT DETECTED.  The SARS-CoV-2 RNA is generally detectable in upper respiratory specimens during the acute phase of infection. The lowest concentration of SARS-CoV-2 viral copies this assay can detect is 138 copies/mL. A negative result does not preclude SARS-Cov-2 infection and should not be used as the sole basis for treatment or other patient management decisions. A negative result may occur with  improper specimen collection/handling, submission of specimen other than nasopharyngeal swab, presence of viral mutation(s) within the areas targeted by this assay, and inadequate number of viral copies(<138 copies/mL). A negative result must be combined with clinical  observations, patient history, and epidemiological information. The expected result is Negative.  Fact Sheet for Patients:  BloggerCourse.com  Fact Sheet for Healthcare Providers:  SeriousBroker.it  This test is no t yet approved or cleared by the [place] FDA and  has been authorized for detection and/or diagnosis of SARS-CoV-2 by FDA under an Emergency Use Authorization (EUA). This EUA will remain  in effect (meaning this test can be used) for the duration of the COVID-19 declaration under Section 564(b)(1) of the Act, 21 U.S.C.section 360bbb-3(b)(1), unless the authorization is terminated  or revoked sooner.       Influenza A by PCR NEGATIVE NEGATIVE Final   Influenza B by PCR NEGATIVE NEGATIVE Final    Comment: (NOTE) The Xpert Xpress SARS-CoV-2/FLU/RSV plus assay is intended as an aid in the diagnosis of influenza from Nasopharyngeal swab specimens and should not be used as a sole basis for treatment. Nasal washings and aspirates are unacceptable for Xpert Xpress SARS-CoV-2/FLU/RSV testing.  Fact Sheet for Patients: BloggerCourse.com  Fact Sheet for Healthcare Providers: SeriousBroker.it  This test is not yet approved or cleared by the [place] FDA and has been authorized for detection and/or diagnosis of SARS-CoV-2 by FDA under an Emergency Use Authorization (EUA). This EUA will remain in effect (meaning this test can be used) for the duration of the COVID-19 declaration under Section 564(b)(1) of the Act, 21 U.S.C. section 360bbb-3(b)(1), unless the authorization is terminated or revoked.  Performed at [facility], [address], [place], [place] [address]      Radiology Studies: No results found.   LOS: 5 days   Lanae Boast, MD [facility]  05/16/2021, 7:16 AM

## 2021-05-16 NOTE — TOC Initial Note (Signed)
Transition of Care [facility]) - Initial/Assessment Note    Patient Details  Name: Dale [name] MRN: [id number] Date of Birth: 06-17-1961  Transition of [facility]) CM/SW Contact:    Lanier Clam, RN Phone Number: 05/16/2021, 3:22 PM  Clinical Narrative: Referral for resources-shelter list given,substance abuse resource list given-encouraged to contact the shelter/Substance list while in hospital for acceptance-informed patient he [facility]-must be there before 2p. [facility]ARE 360 referral placed. On 02 will monitor.                  Expected Discharge Plan: Homeless Shelter Barriers to Discharge: Continued Medical Work up   Patient Goals and CMS Choice Patient states their goals for this hospitalization and ongoing recovery are:: go to shelter CMS Medicare.gov Compare Post Acute Care list provided to:: Patient Choice offered to / list presented to : Patient  Expected Discharge Plan and Services Expected Discharge Plan: Homeless Shelter   Discharge Planning Services: CM Consult Post Acute Care Choice:  (Shelter) Living arrangements for the past 2 months: Apartment                                      Prior Living Arrangements/Services Living arrangements for the past 2 months: Apartment Lives with:: Parents Patient language and need for interpreter reviewed:: Yes Do you feel safe going back to the place where you live?: No   homeless  Need for Family Participation in Patient Care: No (Comment) Care giver support system in place?: Yes (comment)   Criminal Activity/Legal Involvement Pertinent to Current Situation/Hospitalization: No - Comment as needed  Activities of Daily Living Home Assistive Devices/Equipment: Cane (specify quad or straight) (single point cane) ADL Screening (condition at time of admission) Patient's cognitive ability adequate to safely complete daily activities?: Yes Is the patient deaf or have difficulty  hearing?: No Does the patient have difficulty seeing, even when wearing glasses/contacts?: No Does the patient have difficulty concentrating, remembering, or making decisions?: No Patient able to express need for assistance with ADLs?: Yes Does the patient have difficulty dressing or bathing?: Yes Independently performs ADLs?: No Communication: Independent Dressing (OT): Needs assistance Is this a change from baseline?: Change from baseline, expected to last <3days Grooming: Independent Feeding: Independent Bathing: Needs assistance Is this a change from baseline?: Change from baseline, expected to last <3 days Toileting: Needs assistance Is this a change from baseline?: Change from baseline, expected to last <3 days In/Out Bed: Needs assistance Is this a change from baseline?: Change from baseline, expected to last <3 days Walks in Home: Needs assistance Is this a change from baseline?: Change from baseline, expected to last <3 days Does the patient have difficulty walking or climbing stairs?: Yes (secondary to shortness of breath) Weakness of Legs: Both Weakness of Arms/Hands: None  Permission Sought/Granted Permission sought to share information with : Case Manager Permission granted to share information with : Yes, Verbal Permission Granted  Share Information with NAME: Case manager     Permission granted to share info w Relationship: Burnard Leigh [phone]     Emotional Assessment Appearance:: Appears stated age Attitude/Demeanor/Rapport: Gracious Affect (typically observed): Accepting Orientation: : Oriented to Place, Oriented to Self, Oriented to  Time Alcohol / Substance Use: Illicit Drugs Psych Involvement: No (comment)  Admission diagnosis:  Acute CHF (congestive heart failure) (HCC) [I50.9] Patient Active Problem List   Diagnosis Date  Noted   Hypokalemia    Acute on chronic systolic CHF (congestive heart failure) (HCC) 12/25/2020   Coronary artery disease  involving native coronary artery of native heart without angina pectoris    Acute kidney injury superimposed on CKD (HCC) 10/16/2020   Acute on chronic combined systolic and diastolic HF (heart failure) (HCC) 09/08/2020   Acute on chronic combined systolic (congestive) and diastolic (congestive) heart failure (HCC)    Acute CHF (congestive heart failure) (HCC) 05/07/2019   Nausea & vomiting 05/07/2019   Acute exacerbation of CHF (congestive heart failure) (HCC) 03/11/2019   Troponin level elevated 03/11/2019   Hyperlipidemia 03/03/2019   NSVT (nonsustained ventricular tachycardia) (HCC) 03/03/2019   Anxiety and depression    NSTEMI (non-ST elevated myocardial infarction) (HCC) 02/28/2019   Acute systolic heart failure (HCC) 11/08/2018   Hypertensive urgency 09/28/2018   Acute pulmonary edema (HCC) 09/28/2018   Cocaine abuse (HCC) 09/28/2018   Substance abuse (HCC) 09/28/2018   Chronic kidney disease, stage II (mild) 09/28/2018   Normochromic anemia 09/28/2018   Acute systolic CHF (congestive heart failure) (HCC) 09/27/2018   Essential hypertension 03/11/2016   Obesity (BMI 30-39.9) 03/11/2016   Glucosuria 03/11/2016   PCP:  Vesta Mixer, MD Pharmacy:   [facility] - [place], [place] - [address] [place] [address] Phone: [phone] Fax: [phone]  [facility] - [address] [place] - [address] [place] [address] Phone: [phone] Fax: [phone]     Social Determinants of Health (SDOH) Interventions    Readmission Risk Interventions Readmission Risk Prevention Plan 12/15/2020 09/11/2020 03/03/2019  Transportation Screening Complete Complete Complete  PCP or Specialist Appt within 3-5 Days - Complete -  HRI or Home Care Consult - Complete -  Social Work Consult for Recovery Care Planning/Counseling - Complete -  Palliative Care Screening - Complete -   Medication Review Oceanographer) Complete Complete Complete  PCP or Specialist appointment within 3-5 days of discharge Complete - Complete  HRI or Home Care Consult Complete - (No Data)  SW Recovery Care/Counseling Consult Complete - Complete  Palliative Care Screening Not Applicable - Not Applicable  Skilled Nursing Facility Not Applicable - Not Applicable  Some recent data might be hidden
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
## 2021-05-17 DIAGNOSIS — I5023 Acute on chronic systolic (congestive) heart failure: Secondary | ICD-10-CM | POA: Diagnosis not present

## 2021-05-17 DIAGNOSIS — Z9119 Patient's noncompliance with other medical treatment and regimen: Secondary | ICD-10-CM

## 2021-05-17 MED ORDER — LOSARTAN POTASSIUM 25 MG PO TABS: 12.5000 mg | ORAL_TABLET | Freq: Every day | ORAL | 0 refills | Status: DC

## 2021-05-17 MED ORDER — TORSEMIDE 40 MG PO TABS
40.0000 mg | ORAL_TABLET | Freq: Every day | ORAL | 1 refills | Status: DC
Start: 2021-05-17 — End: 2021-06-21

## 2021-05-17 MED ORDER — ATORVASTATIN CALCIUM 40 MG PO TABS: 40.0000 mg | ORAL_TABLET | Freq: Every day | ORAL | 0 refills | Status: DC

## 2021-05-17 MED ORDER — CARVEDILOL 6.25 MG PO TABS
6.2500 mg | ORAL_TABLET | Freq: Two times a day (BID) | ORAL | 0 refills | Status: DC
Start: 2021-05-17 — End: 2021-06-21

## 2021-05-17 NOTE — Progress Notes (Signed)
Pt to be discharged today. Pt given discharge instructions including all discharge Medications and schedules for these Medications. RN stressed the importance of Medication compliance and taking Medications as prescribed. Pt states "I will do the best I can" Discharge AVS with Pt at time of discharge

## 2021-05-17 NOTE — Consult Note (Signed)
Consultation Note Date: 05/17/2021   Patient Name: Labradford Schnitker  DOB: 1961-03-19  MRN: [id number]  Age / Sex: 60 y.o., male  PCP: Nahser, Wonda Cheng, MD Referring Physician: Antonieta Pert, MD  Reason for Consultation: Establishing goals of care  HPI/Patient Profile: 60 y.o. male  with past medical history of chronic systolic and diastolic heart failure, cocaine use disorder, alcohol use, hypertension admitted on 05/11/2021 with acute on chronic diastolic CHF exacerbation.  Previously he had an echo with a EF of less than 20%.  Repeat this admission showed EF of less than 10%.  Palliative consulted for goals of care.  Clinical Assessment and Goals of Care: Palliative care consult received.  Chart reviewed including personal review pertinent labs and imaging.  I met today with Mr. Satter.  Chaplain also providing bedside visit and was present for part of conversation.  I introduced palliative care as specialized medical care for people living with serious illness. It focuses on providing relief from the symptoms and stress of a serious illness. The goal is to improve quality of life for both the patient and the family.  Mr. Peretz expressed understanding how severely sick he is.  He tells me the most important things to him are his family and faith.  His mother is living and he has 2 adult children.  He also has grandchildren.  His father has not been present for his entire life and he is not sure if he is living.  Our conversation today was focused on his challenges with substance abuse and his regret with "letting people down."  He was recently in rehab but returned to his prior living situation when he left the rehab and he tells me that this has not been healthy for him for multiple reasons.  He had previously lived with a girlfriend who provided him with money for drugs.  He reports that he was surrounded by  drugs and prostitution and it was too easy to "fall back into it."    He reports being committed to staying clean and being alive to see his grandchildren.  He is not willing/able to engage today in conversation about the fact that he is at high risk for continued decompensation and death.  We did preliminary discussions regarding advance care planning.  He has not completed a living will or other ACP documents.  He does tell me that he would want his 2 children and his mother to serve his surrogate decision makers in the event he cannot make his own decisions.  We also had gentle discussion about limitations of medical interventions and focusing on interventions that are more likely to get him well enough to be out of the hospital while also beginning to consider limitations on aggressive interventions that are not likely to result in him ever being well enough to leave the hospital (such as CPR or mechanical ventilation).  Again, he is very focused on trying to improve and is not really open to this conversation.  SUMMARY OF RECOMMENDATIONS   -  Full code/full scope -He reports that his mother and 2 children will be his surrogate decision makers in the event he cannot make his own medical decisions.  We did review surrogate decision making in [place] and these would be his legal surrogates.  Offered to work toward completion of advance directive outlining this if he desires. -Mr. Raymundo is struggling to process the severity of his condition.  He is not really in a place where he is emotionally able to engage in conversation regarding "what if's" or end-of-life planning.  We will continue to follow and build rapport and advance conversation as he is emotionally able to do so. -Palliative to continue to follow  Code Status/Advance Care Planning: Full code  Palliative Prophylaxis:  Frequent Pain Assessment  Additional Recommendations (Limitations, Scope, Preferences): Full Scope  Treatment  Psycho-social/Spiritual:  Desire for further Chaplaincy support:yes Additional Recommendations: Caregiving  Support/Resources  Prognosis:  Unable to determine  Discharge Planning: To Be Determined      Primary Diagnoses: Present on Admission:  Essential hypertension  Cocaine abuse ([place])  Acute pulmonary edema (HCC)  Chronic kidney disease, stage II (mild)  Hyperlipidemia  Coronary artery disease involving native coronary artery of native heart without angina pectoris  Hypokalemia   I have reviewed the medical record, interviewed the patient and family, and examined the patient. The following aspects are pertinent.  Past Medical History:  Diagnosis Date   Alcohol abuse    Chronic combined systolic and diastolic CHF (congestive heart failure) ([place])    a. 09/27/18 showed mild LVH, EF 20-25%, grade 2 DD, mild MR, severely dilated LA, mildly dilated RV with mildly reduced RV function, mod RAE.   Cocaine abuse ([place])    Hypertension    Social History   Socioeconomic History   Marital status: Single    Spouse name: none   Number of children: Not on file   Years of education: Not on file   Highest education level: Not on file  Occupational History   Occupation: Disabled  Tobacco Use   Smoking status: Never   Smokeless tobacco: Never  Vaping Use   Vaping Use: Never used  Substance and Sexual Activity   Alcohol use: Yes    Comment: 2 quarts a week    Drug use: Yes    Frequency: 3.0 times per week    Types: Marijuana, Cocaine   Sexual activity: Not Currently  Other Topics Concern   Not on file  Social History Narrative   Not on file   Social Determinants of Health   Financial Resource Strain: Medium Risk   Difficulty of Paying Living Expenses: Somewhat hard  Food Insecurity: Food Insecurity Present   Worried About Charity fundraiser in the Last Year: Sometimes true   Arboriculturist in the Last Year: Sometimes true  Transportation Needs: Nurse, learning disability (Medical): Yes   Lack of Transportation (Non-Medical): Yes  Physical Activity: Not on file  Stress: Not on file  Social Connections: Not on file   Family History  Problem Relation Age of Onset   Hypertension Maternal Grandmother    Scheduled Meds:  aspirin EC  81 mg Oral Daily   atorvastatin  40 mg Oral Daily   carvedilol  6.25 mg Oral BID WC   enoxaparin (LOVENOX) injection  40 mg Subcutaneous Q24H   ferrous sulfate  325 mg Oral Q breakfast   furosemide  40 mg Intravenous Q12H   gabapentin  100 mg Oral TID   losartan  12.5 mg Oral Daily   pantoprazole  40 mg Oral Daily   Continuous Infusions: PRN Meds:.acetaminophen **OR** acetaminophen, traZODone Medications Prior to Admission:  Prior to Admission medications   Medication Sig Start Date End Date Taking? Authorizing Provider  aspirin EC 81 MG EC tablet Take 1 tablet (81 mg total) by mouth daily. Swallow whole. 12/17/20  Yes Lorella Nimrod, MD  atorvastatin (LIPITOR) 40 MG tablet Take 1 tablet (40 mg total) by mouth daily. 12/17/20 05/11/21 Yes Lorella Nimrod, MD  carvedilol (COREG) 6.25 MG tablet Take 1 tablet (6.25 mg total) by mouth 2 (two) times daily with a meal. 12/17/20 05/11/21 Yes Lorella Nimrod, MD  ferrous sulfate 325 (65 FE) MG EC tablet Take 325 mg by mouth daily with breakfast.   Yes [provider]  isosorbide-hydrALAZINE (BIDIL) 20-37.5 MG tablet Take 1 tablet by mouth 3 (three) times daily. 12/17/20  Yes Lorella Nimrod, MD  losartan (COZAAR) 25 MG tablet Take 0.5 tablets (12.5 mg total) by mouth daily. 12/17/20  Yes Lorella Nimrod, MD  torsemide 40 MG TABS Take 40 mg by mouth 2 (two) times daily. 12/17/20  Yes Lorella Nimrod, MD  diclofenac sodium (VOLTAREN) 1 % GEL Apply 2 g topically 4 (four) times daily as needed (pain).    [provider]  gabapentin (NEURONTIN) 300 MG capsule Take 1 capsule (300 mg total) by mouth at bedtime. Patient not taking: Reported on  05/11/2021 10/21/20   Dessa Phi, DO  pantoprazole (PROTONIX) 40 MG tablet Take 1 tablet (40 mg total) by mouth daily. Patient not taking: No sig reported 10/21/20   Dessa Phi, DO  spironolactone (ALDACTONE) 25 MG tablet Take 1 tablet (25 mg total) by mouth 2 (two) times daily. Patient not taking: Reported on 05/11/2021 01/01/21   Sharen Hones, MD   Allergies  Allergen Reactions   Hydrocodone Itching   Review of Systems  Constitutional:  Positive for activity change and fatigue.  Respiratory:  Positive for chest tightness and shortness of breath.   Cardiovascular:  Positive for leg swelling.  Neurological:  Positive for weakness.  Psychiatric/Behavioral:  Positive for sleep disturbance.    Physical Exam General: Alert, awake, in no acute distress.   HEENT: No bruits, no goiter, no JVD Heart: Regular rate and rhythm. No murmur appreciated. Lungs: Good air movement, clear Abdomen: Soft, nontender, nondistended, positive bowel sounds.   Ext: +edema Skin: Warm and dry Neuro: Grossly intact, nonfocal.   Vital Signs: BP 94/69 (BP Location: Right Arm)   Pulse 71   Temp 97.7 F (36.5 C) (Oral)   Resp 18   Ht 5' 11"  (1.803 m)   Wt 117.7 kg   SpO2 100%   BMI 36.19 kg/m  Pain Scale: 0-10   Pain Score: 0-No pain   SpO2: SpO2: 100 % O2 Device:SpO2: 100 % O2 Flow Rate: .O2 Flow Rate (L/min): 3 L/min  IO: Intake/output summary:  Intake/Output Summary (Last 24 hours) at 05/17/2021 0841 Last data filed at 05/17/2021 0839 Gross per 24 hour  Intake 480 ml  Output 350 ml  Net 130 ml    LBM: Last BM Date: 05/14/21 Baseline Weight: Weight: 117 kg Most recent weight: Weight: 117.7 kg     Palliative Assessment/Data:   Flowsheet Rows    Flowsheet Row Most Recent Value  Intake Tab   Referral Department Hospitalist  Unit at Time of Referral Med/Surg Unit  Palliative Care Primary Diagnosis Cardiac  Date Notified 05/15/21  Palliative Care Type New Palliative care  Reason  for referral Clarify Goals of Care  Date of Admission 05/11/21  Date first seen by Palliative Care 05/16/21  # of days Palliative referral response time 1 Day(s)  # of days IP prior to Palliative referral 4  Clinical Assessment   Palliative Performance Scale Score 40%  Psychosocial & Spiritual Assessment   Palliative Care Outcomes   Patient/Family meeting held? Yes  Who was at the meeting? Patient  Palliative Care Outcomes ACP counseling assistance       Time In: 1200 Time Out: 1300 Time Total: 60 minutes  Greater than 50%  of this time was spent counseling and coordinating care related to the above assessment and plan.  Signed by: Micheline Rough, MD   Please contact Palliative Medicine Team phone at [phone] for questions and concerns.  For individual provider: See Shea Evans

## 2021-05-17 NOTE — Discharge Summary (Signed)
Physician Discharge Summary  Dale [name] [place] ZOX:[id number] DOB: 1961/10/05 DOA: 05/11/2021  PCP: Vesta Mixer, MD  Admit date: 05/11/2021 Discharge date: 05/17/2021  Admitted From: homeless Disposition:  shelter/IRC  Recommendations for Outpatient Follow-up:  Follow up with PCP , cardiology in 1-2 weeks Please obtain BMP/CBC in one week Please follow up on the following pending results:  Home Health:no  Equipment/Devices: none  Discharge Condition: Stable Code Status:   Code Status: Full Code Diet recommendation:  Diet Order             Diet - low sodium heart healthy           Diet Heart Room service appropriate? 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 Patient was admitted he was managed with IV Lasix his CHF is significantly improved Edema shortness of breath has improved.  He was able to be weaned off to room air saturating 90% room air this morning. Seen by cardiology due to his very poor compliance, substance abuse he is at high risk for decompensation and death which he is aware. Discussed cardiogenic about discharge home today  Discharge Diagnoses:  Acute exacerbation of combined systolic and diastolic CHF  Previous EF on 12/14/2020 less than 20% severely decreased LV function, LV global hypokinesia.  Repeat echo showed EF less than 10% 05/12/21, GIIdd. Non complaints. Treated w/  iv lasix seen by cards 2/2 lvef < 10%.  He will continue his losartan and Coreg and goal-directed therapy.  He is BiDil and Aldactone remains on hold due to soft blood pressure.  We will continue on torsemide, prescription has been sent to his  pharmacy as a new prescription and case manager has arranged for his disposition.  He is stable for discharge, discussed cardiology and okay for discharge.      Hypokalemia resolved  Essential hypertension: BP soft continue stable On Coreg 6.25 and losartan 12.5  Cocaine abuse/tobacco abuse:cessation discussed in the light of his poor ejection fraction, he is high risk for death he is aware.  Will need close  CKD stage IIIa creatinine stable.  Outpatient follow-up r Recent Labs  Lab 05/11/21 0825 05/12/21 0447 05/14/21 0424 05/15/21 0423 05/16/21 0426  BUN 36* 40* 52* 53* 58*  CREATININE 1.48* 1.61* 1.78* 1.65* 1.79*    HLD- cont statins   Medical noncompliance- discussed in detail for compliance.  Given his poor cardiac status patient was axtensively counseled for need to be compliant  Morbid obesity BMI 36 OSA. Refused CPAP will been.  Acute weight loss.  He is aware  Consults: cardiology  Subjective: Alert awake, not in distress resting comfortably on room air.  Somewhat agitated this morning wanting to eat.  Discharge Exam: Vitals:   05/17/21 0411 05/17/21 1007  BP: 94/69   Pulse: 71 80  Resp: 18   Temp: 97.7 F (36.5 C)   SpO2: 100% 93%   General: Pt is alert, awake, not in acute distress Cardiovascular: RRR, S1/S2 +, no rubs, no gallops Respiratory: CTA bilaterally, no wheezing, no rhonchi Abdominal: Soft, NT, ND, bowel sounds + Extremities: no edema, no cyanosis  Discharge Instructions  Discharge Instructions     AMB referral to CHF clinic   Complete by: As directed    Diet - low sodium heart healthy   Complete by: As directed    Discharge instructions   Complete by: As directed    Please monitor weight daily, avoid strain and stop activity anytime you have chest pain or worsening shortness of breath. Anytime you have any of the following symptoms: 1) 3 pound weight gain in 24 hours or 5 pounds in 1 week 2) shortness of breath, with or without a dry  hacking cough 3) swelling in the hands, feet or stomach 4) if you have to sleep on extra pillows at night in order to breathe.    Please call call MD or return to ER for similar or worsening recurring problem that brought you to hospital or if any fever,nausea/vomiting,abdominal pain, uncontrolled pain, chest pain,  shortness of breath or any other alarming symptoms.  Please follow-up your doctor as instructed in a week time and call the office for appointment.  Please avoid alcohol, smoking, or any other illicit substance and maintain healthy habits including taking your regular medications as prescribed.  You were cared for by a hospitalist during your hospital stay. If you have any questions about your discharge medications or the care you received while you were in the hospital after you are discharged, you can call the unit and ask to speak with the hospitalist on call if the hospitalist that took care of you is not available.  Once you are discharged, your primary care physician will handle any further medical issues. Please note that NO REFILLS for any discharge medications will be authorized once you are discharged, as it is imperative that you return to your primary care physician (or establish a relationship with a primary care physician if you do not have one) for your aftercare needs so that they can reassess your need for medications and monitor your lab values   Increase activity slowly   Complete by: As directed       Allergies as of 05/17/2021       Reactions   Hydrocodone Itching        Medication List     STOP taking these medications    isosorbide-hydrALAZINE 20-37.5 MG tablet Commonly known as: BIDIL   pantoprazole 40 MG tablet Commonly known as: PROTONIX   spironolactone 25 MG tablet Commonly known as: ALDACTONE       TAKE these medications    aspirin 81 MG EC tablet Take 1 tablet (81 mg total) by mouth daily. Swallow whole. Notes to patient: Last  Dose of this Medication was given on 05/17/2021 at 10:06 am   atorvastatin 40 MG tablet Commonly known as: LIPITOR Take 1 tablet (40 mg total) by mouth daily. Notes to patient: Last Dose of this Medication was given on May 17, 2021 at 10:06 am   carvedilol 6.25 MG tablet Commonly known as: COREG Take 1 tablet (6.25 mg total) by mouth 2 (two) times daily with a meal. Notes to patient: Last Dose of this Medication was given on May 17, 2021 at 7:40 am   diclofenac sodium 1 % Gel Commonly known as: VOLTAREN Apply 2 g topically 4 (four) times daily as needed (pain).   ferrous sulfate 325 (65 FE) MG EC tablet Take 325 mg by mouth daily with breakfast. Notes to patient: Last Dose of this Medication was given on May 17, 2021 at 7:40 am   losartan 25  MG tablet Commonly known as: COZAAR Take 0.5 tablets (12.5 mg total) by mouth daily. Notes to patient: Last Dose of this Medication was given on May 17, 2021 at 10:06 am    Torsemide 40 MG Tabs Take 40 mg by mouth daily at 6 (six) AM. What changed: when to take this Notes to patient: Please take this Medication every day at 6 pm       ASK your doctor about these medications    gabapentin 300 MG capsule Commonly known as: NEURONTIN Take 1 capsule (300 mg total) by mouth at bedtime. Notes to patient: Last Dose of this Medication was given on May 17, 2021 at 10:06 am        Follow-up Information     Nahser, Deloris Ping, MD Follow up in 1 week(s).   Specialty: Cardiology Contact information: [address] [place] [address] [phone]         Nahser, Deloris Ping, MD .   Specialty: Cardiology Contact information: [address] [place] [address] [phone]                Allergies  Allergen Reactions   Hydrocodone Itching    The results of significant diagnostics from this hospitalization (including imaging, microbiology, ancillary and laboratory) are listed below for  reference.    Microbiology: Recent Results (from the past 240 hour(s))  Resp Panel by RT-PCR (Flu A&B, Covid) Nasopharyngeal Swab     Status: None   Collection Time: 05/11/21  2:25 PM   Specimen: Nasopharyngeal Swab; Nasopharyngeal(NP) swabs in vial transport medium  Result Value Ref Range Status   SARS Coronavirus 2 by RT PCR NEGATIVE NEGATIVE Final    Comment: (NOTE) SARS-CoV-2 target nucleic acids are NOT DETECTED.  The SARS-CoV-2 RNA is generally detectable in upper respiratory specimens during the acute phase of infection. The lowest concentration of SARS-CoV-2 viral copies this assay can detect is 138 copies/mL. A negative result does not preclude SARS-Cov-2 infection and should not be used as the sole basis for treatment or other patient management decisions. A negative result may occur with  improper specimen collection/handling, submission of specimen other than nasopharyngeal swab, presence of viral mutation(s) within the areas targeted by this assay, and inadequate number of viral copies(<138 copies/mL). A negative result must be combined with clinical observations, patient history, and epidemiological information. The expected result is Negative.  Fact Sheet for Patients:  BloggerCourse.com  Fact Sheet for Healthcare Providers:  SeriousBroker.it  This test is no t yet approved or cleared by the [place] FDA and  has been authorized for detection and/or diagnosis of SARS-CoV-2 by FDA under an Emergency Use Authorization (EUA). This EUA will remain  in effect (meaning this test can be used) for the duration of the COVID-19 declaration under Section 564(b)(1) of the Act, 21 U.S.C.section 360bbb-3(b)(1), unless the authorization is terminated  or revoked sooner.       Influenza A by PCR NEGATIVE NEGATIVE Final   Influenza B by PCR NEGATIVE NEGATIVE Final    Comment: (NOTE) The Xpert Xpress SARS-CoV-2/FLU/RSV  plus assay is intended as an aid in the diagnosis of influenza from Nasopharyngeal swab specimens and should not be used as a sole basis for treatment. Nasal washings and aspirates are unacceptable for Xpert Xpress SARS-CoV-2/FLU/RSV testing.  Fact Sheet for Patients: BloggerCourse.com  Fact Sheet for Healthcare Providers: SeriousBroker.it  This test is not yet approved or cleared by the [place] FDA and has  been authorized for detection and/or diagnosis of SARS-CoV-2 by FDA under an Emergency Use Authorization (EUA). This EUA will remain in effect (meaning this test can be used) for the duration of the COVID-19 declaration under Section 564(b)(1) of the Act, 21 U.S.C. section 360bbb-3(b)(1), unless the authorization is terminated or revoked.  Performed at [facility], [address] [place], [place] [address]     Procedures/Studies: DG Chest 2 View  Result Date: 05/11/2021 CLINICAL DATA:  Shortness of breath EXAM: CHEST - 2 VIEW COMPARISON:  12/25/2020 FINDINGS: Cardiomegaly and borderline vascular congestion. No Kerley lines or consolidation. No effusion or pneumothorax. IMPRESSION: Cardiomegaly without edema. Electronically Signed   By: Marnee SpringJonathon  Watts M.D.   On: 05/11/2021 07:08   CT Angio Chest PE W and/or Wo Contrast  Result Date: 05/11/2021 CLINICAL DATA:  Rule out pulmonary embolus. EXAM: CT ANGIOGRAPHY CHEST WITH CONTRAST TECHNIQUE: Multidetector CT imaging of the chest was performed using the standard protocol during bolus administration of intravenous contrast. Multiplanar CT image reconstructions and MIPs were obtained to evaluate the vascular anatomy. CONTRAST:  75mL OMNIPAQUE IOHEXOL 350 MG/ML SOLN COMPARISON:  09/08/20 FINDINGS: Cardiovascular: Satisfactory opacification of the pulmonary arteries to the segmental level. No evidence of pulmonary embolism. Multi chamber cardiac enlargement is again  noted, similar to previous exam. No pericardial effusion. Mild aortic atherosclerosis and coronary artery calcifications. Mediastinum/Nodes: Normal appearance of the thyroid gland. The trachea appears patent and is midline. Normal appearance of the esophagus. No axillary, supraclavicular pre-vascular lymph node is enlarged measuring 1.9 cm, image 97/5. This is unchanged from previous exam. 2.2 cm subcarinal lymph node is noted, image 130/5. Also unchanged. Lungs/Pleura: No pleural effusion. No airspace consolidation, atelectasis or pneumothorax. Mild diffuse ground-glass attenuation identified. Interlobular septal thickening identified in the right base. 4 mm perifissural right middle lobe lung nodule is identified, unchanged from previous exam. Likely benign intrapulmonary lymph node. Upper Abdomen: No acute abnormality. Reflux of contrast material from the right atrium into the IVC and hepatic veins compatible with passive venous congestion. Musculoskeletal: No chest wall abnormality. No acute or significant osseous findings. Review of the MIP images confirms the above findings. IMPRESSION: 1. No evidence for acute pulmonary embolus. 2. Multi chamber cardiac enlargement, mild pulmonary edema and passive venous congestion compatible with CHF. 3. Aortic atherosclerosis and coronary artery calcifications. 4. Stable enlarged pre-vascular and subcarinal lymph nodes. Etiology is indeterminate but may be reactive in etiology. Aortic Atherosclerosis (ICD10-I70.0). Electronically Signed   By: Signa Kellaylor  Stroud M.D.   On: 05/11/2021 12:26   MR BRAIN WO CONTRAST  Result Date: 05/11/2021 CLINICAL DATA:  Neuro deficit, acute, stroke suspected. EXAM: MRI HEAD WITHOUT CONTRAST TECHNIQUE: Multiplanar, multiecho pulse sequences of the brain and surrounding structures were obtained without intravenous contrast. COMPARISON:  None. FINDINGS: Brain: There is no evidence of an acute infarct, mass, midline shift, or extra-axial fluid  collection. The ventricles are normal in size. Patchy T2 hyperintensities in the cerebral white matter bilaterally are nonspecific but compatible with moderately age advanced chronic small vessel ischemic disease with some areas of T2 shine through noted on diffusion imaging. There are small cortical infarcts in the right parieto-occipital region and posterior right insula which have a subacute to chronic appearance. There is a small amount of chronic blood products associated with the right parieto-occipital infarct. There is also likely a subtle small chronic cortical infarct in the posterior left frontal lobe with a small amount of chronic blood products. Vascular: Major intracranial vascular flow voids are  preserved. Skull and upper cervical spine: No suspicious marrow lesion. Cystic focus in the right petrous apex appears contiguous with Meckel's cave and likely reflects an incidental cephalocele. Sinuses/Orbits: Unremarkable orbits. Small right mastoid effusion. Clear paranasal sinuses. Other: None. IMPRESSION: 1. No acute intracranial abnormality. 2. Moderate chronic small vessel ischemic disease with multiple small nonacute infarcts as above. Electronically Signed   By: Sebastian Ache M.D.   On: 05/11/2021 11:10   ECHOCARDIOGRAM COMPLETE  Result Date: 05/12/2021    ECHOCARDIOGRAM REPORT   Patient Name:   EBB CARELOCK Date of Exam: 05/12/2021 Medical Rec #:  [id number]           Height:       71.0 in Accession #:    [id number]          Weight:       252.8 lb Date of Birth:  10/07/1961           BSA:          2.329 m Patient Age:    59 years            BP:           117/99 mmHg Patient Gender: M                   HR:           70 bpm. Exam Location:  Inpatient Procedure: 2D Echo, Color Doppler and Cardiac Doppler Indications:    CHF  History:        Patient has prior history of Echocardiogram examinations, most                 recent 12/14/2020. CHF; Risk Factors:Hypertension and Diabetes.                  05/10/2019 cath.  Sonographer:    Neomia Dear RDCS Referring Phys: [id number] Teddy Spike IMPRESSIONS  1. No left ventriculatr thrombus is seen. Left ventricular ejection fraction, by estimation, is <10%. The left ventricle has severely decreased function. The left ventricle demonstrates global hypokinesis. The left ventricular internal cavity size was severely dilated. Left ventricular diastolic parameters are consistent with Grade II diastolic dysfunction (pseudonormalization). Elevated left atrial pressure.  2. Right ventricular systolic function is severely reduced. The right ventricular size is severely enlarged. There is mildly elevated pulmonary artery systolic pressure.  3. Left atrial size was severely dilated.  4. Right atrial size was severely dilated.  5. The mitral valve is normal in structure. Mild mitral valve regurgitation.  6. Tricuspid valve regurgitation is mild to moderate.  7. The aortic valve is tricuspid. Aortic valve regurgitation is not visualized. No aortic stenosis is present.  8. The inferior vena cava is dilated in size with <50% respiratory variability, suggesting right atrial pressure of 15 mmHg. Comparison(s): No significant change from prior study. Prior images reviewed side by side. The left ventricular diastolic function is unchanged. The right ventricular systolic function is unchanged. IVC was no seen on the previous study; allowing for that, the estimated systolic PA pressure is probably only slightly higher. Diastolic function was not assessd on the previous study. FINDINGS  Left Ventricle: No left ventriculatr thrombus is seen. Left ventricular ejection fraction, by estimation, is <10%. The left ventricle has severely decreased function. The left ventricle demonstrates global hypokinesis. The left ventricular internal cavity size was severely dilated. There is no left ventricular hypertrophy. Left ventricular diastolic parameters are consistent with Grade II diastolic  dysfunction (pseudonormalization). Elevated left atrial pressure. Right Ventricle: The right ventricular size is severely enlarged. No increase in right ventricular wall thickness. Right ventricular systolic function is severely reduced. There is mildly elevated pulmonary artery systolic pressure. The tricuspid regurgitant velocity is 2.53 m/s, and with an assumed right atrial pressure of 15 mmHg, the estimated right ventricular systolic pressure is 40.6 mmHg. Left Atrium: Left atrial size was severely dilated. Right Atrium: Right atrial size was severely dilated. Pericardium: There is no evidence of pericardial effusion. Mitral Valve: The mitral valve is normal in structure. Mild mitral valve regurgitation. Tricuspid Valve: The tricuspid valve is normal in structure. Tricuspid valve regurgitation is mild to moderate. Aortic Valve: The aortic valve is tricuspid. Aortic valve regurgitation is not visualized. Aortic regurgitation PHT measures 412 msec. No aortic stenosis is present. Aortic valve mean gradient measures 2.0 mmHg. Aortic valve peak gradient measures 3.2 mmHg. Aortic valve area, by VTI measures 2.78 cm. Pulmonic Valve: The pulmonic valve was grossly normal. Pulmonic valve regurgitation is mild. Aorta: The aortic root is normal in size and structure. Venous: The inferior vena cava is dilated in size with less than 50% respiratory variability, suggesting right atrial pressure of 15 mmHg. IAS/Shunts: No atrial level shunt detected by color flow Doppler.  LEFT VENTRICLE PLAX 2D LVIDd:         7.80 cm      Diastology LVIDs:         7.70 cm      LV e' medial:    3.26 cm/s LV PW:         1.20 cm      LV E/e' medial:  24.3 LV IVS:        1.20 cm      LV e' lateral:   4.95 cm/s LVOT diam:     2.50 cm      LV E/e' lateral: 16.0 LV SV:         45 LV SV Index:   19 LVOT Area:     4.91 cm  LV Volumes (MOD) LV vol d, MOD A2C: 350.0 ml LV vol d, MOD A4C: 254.0 ml LV vol s, MOD A2C: 259.0 ml LV vol s, MOD A4C: 222.0 ml  LV SV MOD A2C:     91.0 ml LV SV MOD A4C:     254.0 ml LV SV MOD BP:      54.5 ml RIGHT VENTRICLE RV Basal diam:  5.00 cm RV Mid diam:    2.70 cm RV S prime:     7.37 cm/s TAPSE (M-mode): 0.5 cm LEFT ATRIUM              Index       RIGHT ATRIUM           Index LA diam:        5.20 cm  2.23 cm/m  RA Area:     40.10 cm LA Vol (A2C):   131.0 ml 56.24 ml/m RA Volume:   175.00 ml 75.13 ml/m LA Vol (A4C):   126.0 ml 54.09 ml/m LA Biplane Vol: 131.0 ml 56.24 ml/m  AORTIC VALVE                   PULMONIC VALVE AV Area (Vmax):    3.15 cm    PV Vmax:       0.57 m/s AV Area (Vmean):   2.81 cm    PV Vmean:      39.800 cm/s AV Area (VTI):  2.78 cm    PV VTI:        0.098 m AV Vmax:           89.60 cm/s  PV Peak grad:  1.3 mmHg AV Vmean:          61.150 cm/s PV Mean grad:  1.0 mmHg AV VTI:            0.161 m AV Peak Grad:      3.2 mmHg AV Mean Grad:      2.0 mmHg LVOT Vmax:         57.45 cm/s LVOT Vmean:        35.000 cm/s LVOT VTI:          0.091 m LVOT/AV VTI ratio: 0.57 AI PHT:            412 msec  AORTA Ao Root diam: 3.70 cm Ao Asc diam:  3.60 cm MITRAL VALVE               TRICUSPID VALVE MV Area (PHT): 5.75 cm    TR Peak grad:   25.6 mmHg MV Decel Time: 132 msec    TR Vmax:        253.00 cm/s MR Peak grad: 65.3 mmHg MR Mean grad: 44.0 mmHg    SHUNTS MR Vmax:      404.00 cm/s  Systemic VTI:  0.09 m MR Vmean:     318.5 cm/s   Systemic Diam: 2.50 cm MV E velocity: 79.30 cm/s MV A velocity: 48.10 cm/s MV E/A ratio:  1.65 Mihai Croitoru MD Electronically signed by Thurmon Fair MD Signature Date/Time: 05/12/2021/1:59:37 PM    Final     Labs: BNP (last 3 results) Recent Labs    12/29/20 0608 01/01/21 0337 05/11/21 0825  BNP 1,290.7* 916.9* 3,761.6*   Basic Metabolic Panel: Recent Labs  Lab 05/11/21 0825 05/11/21 2014 05/12/21 0447 05/13/21 1457 05/14/21 0424 05/15/21 0423 05/16/21 0426  NA 139  --  142  --  138 138 138  K 3.2*  --  3.1* 3.7 3.9 4.0 3.5  CL 102  --  103  --  105 107 103  CO2 27   --  29  --  23 23 27   GLUCOSE 107*  --  141*  --  120* 124* 116*  BUN 36*  --  40*  --  52* 53* 58*  CREATININE 1.48*  --  1.61*  --  1.78* 1.65* 1.79*  CALCIUM 8.6*  --  8.8*  --  9.2 8.6* 8.7*  MG  --  2.2  --  2.2  --  2.2 2.3   Liver Function Tests: Recent Labs  Lab 05/11/21 0825 05/12/21 0447  AST 24 22  ALT 20 19  ALKPHOS 78 72  BILITOT 1.4* 1.2  PROT 7.3 6.5  ALBUMIN 3.6 3.2*   No results for input(s): LIPASE, AMYLASE in the last 168 hours. No results for input(s): AMMONIA in the last 168 hours. CBC: Recent Labs  Lab 05/11/21 0825 05/12/21 0447  WBC 6.6 6.3  NEUTROABS 4.9  --   HGB 13.2 12.4*  HCT 41.3 38.1*  MCV 88.2 87.6  PLT 296 283   Cardiac Enzymes: No results for input(s): CKTOTAL, CKMB, CKMBINDEX, TROPONINI in the last 168 hours. BNP: Invalid input(s): POCBNP CBG: No results for input(s): GLUCAP in the last 168 hours. D-Dimer No results for input(s): DDIMER in the last 72 hours. Hgb A1c No results for input(s): HGBA1C in the last  72 hours. Lipid Profile No results for input(s): CHOL, HDL, LDLCALC, TRIG, CHOLHDL, LDLDIRECT in the last 72 hours. Thyroid function studies No results for input(s): TSH, T4TOTAL, T3FREE, THYROIDAB in the last 72 hours.  Invalid input(s): FREET3 Anemia work up No results for input(s): VITAMINB12, FOLATE, FERRITIN, TIBC, IRON, RETICCTPCT in the last 72 hours. Urinalysis    Component Value Date/Time   COLORURINE STRAW (A) 12/25/2020 1143   APPEARANCEUR CLEAR (A) 12/25/2020 1143   LABSPEC 1.006 12/25/2020 1143   PHURINE 7.0 12/25/2020 1143   GLUCOSEU NEGATIVE 12/25/2020 1143   HGBUR NEGATIVE 12/25/2020 1143   BILIRUBINUR NEGATIVE 12/25/2020 1143   KETONESUR NEGATIVE 12/25/2020 1143   PROTEINUR NEGATIVE 12/25/2020 1143   UROBILINOGEN 0.2 03/11/2016 0934   NITRITE NEGATIVE 12/25/2020 1143   LEUKOCYTESUR NEGATIVE 12/25/2020 1143   Sepsis Labs Invalid input(s): PROCALCITONIN,  WBC,  LACTICIDVEN Microbiology Recent  Results (from the past 240 hour(s))  Resp Panel by RT-PCR (Flu A&B, Covid) Nasopharyngeal Swab     Status: None   Collection Time: 05/11/21  2:25 PM   Specimen: Nasopharyngeal Swab; Nasopharyngeal(NP) swabs in vial transport medium  Result Value Ref Range Status   SARS Coronavirus 2 by RT PCR NEGATIVE NEGATIVE Final    Comment: (NOTE) SARS-CoV-2 target nucleic acids are NOT DETECTED.  The SARS-CoV-2 RNA is generally detectable in upper respiratory specimens during the acute phase of infection. The lowest concentration of SARS-CoV-2 viral copies this assay can detect is 138 copies/mL. A negative result does not preclude SARS-Cov-2 infection and should not be used as the sole basis for treatment or other patient management decisions. A negative result may occur with  improper specimen collection/handling, submission of specimen other than nasopharyngeal swab, presence of viral mutation(s) within the areas targeted by this assay, and inadequate number of viral copies(<138 copies/mL). A negative result must be combined with clinical observations, patient history, and epidemiological information. The expected result is Negative.  Fact Sheet for Patients:  BloggerCourse.com  Fact Sheet for Healthcare Providers:  SeriousBroker.it  This test is no t yet approved or cleared by the [place] FDA and  has been authorized for detection and/or diagnosis of SARS-CoV-2 by FDA under an Emergency Use Authorization (EUA). This EUA will remain  in effect (meaning this test can be used) for the duration of the COVID-19 declaration under Section 564(b)(1) of the Act, 21 U.S.C.section 360bbb-3(b)(1), unless the authorization is terminated  or revoked sooner.       Influenza A by PCR NEGATIVE NEGATIVE Final   Influenza B by PCR NEGATIVE NEGATIVE Final    Comment: (NOTE) The Xpert Xpress SARS-CoV-2/FLU/RSV plus assay is intended as an aid in the  diagnosis of influenza from Nasopharyngeal swab specimens and should not be used as a sole basis for treatment. Nasal washings and aspirates are unacceptable for Xpert Xpress SARS-CoV-2/FLU/RSV testing.  Fact Sheet for Patients: BloggerCourse.com  Fact Sheet for Healthcare Providers: SeriousBroker.it  This test is not yet approved or cleared by the [place] FDA and has been authorized for detection and/or diagnosis of SARS-CoV-2 by FDA under an Emergency Use Authorization (EUA). This EUA will remain in effect (meaning this test can be used) for the duration of the COVID-19 declaration under Section 564(b)(1) of the Act, 21 U.S.C. section 360bbb-3(b)(1), unless the authorization is terminated or revoked.  Performed at [facility], [address] [place], [place] [address]      Time coordinating discharge: 35 minutes  SIGNED: Lanae Boast, MD  [facility] 05/17/2021, 2:20 PM  If 7PM-7AM, please contact night-coverage [email or web address]

## 2021-05-17 NOTE — TOC Transition Note (Signed)
Transition of Care [facility]) - CM/SW Discharge Note   Patient Details  Name: Dale [name] MRN: [id number] Date of Birth: Feb 05, 1961  [facility] CM/SW Contact:  Lanier Clam, RN Phone Number: 05/17/2021, 11:41 AM   Clinical Narrative:  Per nsg patient needed a bus pass-provided t patient by nurse-patient ready to leave-couldn't wait for nsg-says his ride is here-while in elevator-CM checked his d/c summary packet for the bus pass-it wasn't there-explained to patient that since he was getting a ride-we could still use the bus pass in the hospital.Patient said you're a trip, then elevator door closed. No further CM needs.     Final next level of care: Homeless Shelter Barriers to Discharge: Continued Medical Work up   Patient Goals and CMS Choice Patient states their goals for this hospitalization and ongoing recovery are:: go to shelter CMS Medicare.gov Compare Post Acute Care list provided to:: Patient Choice offered to / list presented to : Patient  Discharge Placement                       Discharge Plan and Services   Discharge Planning Services: CM Consult Post Acute Care Choice:  (Shelter)                               Social Determinants of Health (SDOH) Interventions SDOH Interventions for the Following Domains: Alcohol Usage, Housing (cocaine abuse) Housing Interventions: [id number] Referral Tobacco Interventions: [id number] Referral Alcohol Brief Interventions/Follow-up: Alcohol education/Brief advice   Readmission Risk Interventions Readmission Risk Prevention Plan 12/15/2020 09/11/2020 03/03/2019  Transportation Screening Complete Complete Complete  PCP or Specialist Appt within 3-5 Days - Complete -  HRI or Home Care Consult - Complete -  Social Work Consult for Recovery Care Planning/Counseling - Complete -  Palliative Care Screening - Complete -  Medication Review Oceanographer) Complete Complete Complete  PCP or Specialist  appointment within 3-5 days of discharge Complete - Complete  HRI or Home Care Consult Complete - (No Data)  SW Recovery Care/Counseling Consult Complete - Complete  Palliative Care Screening Not Applicable - Not Applicable  Skilled Nursing Facility Not Applicable - Not Applicable  Some recent data might be hidden

## 2021-05-17 NOTE — Progress Notes (Signed)
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  Asked to see for heart failure management  Subjective   No issues overnight - I's/O's not accurate.  HF meds restarted. Weight is 118 kg. BP soft.  Objective   Vitals:   05/16/21 2228 05/17/21 0300 05/17/21 0411 05/17/21 1007  BP:   94/69   Pulse: 69  71 80  Resp: 20  18   Temp:   97.7 F (36.5 C)   TempSrc:   Oral   SpO2: 100%  100% 93%  Weight:  117.7 kg    Height:        Intake/Output Summary (Last 24 hours) at 05/17/2021 1021 Last data filed at 05/17/2021 2297 Gross per 24 hour  Intake 480 ml  Output 350 ml  Net 130 ml   Filed Weights   05/14/21 0500 05/15/21 0400 05/17/21 0300  Weight: 118 kg 118.5 kg 117.7 kg    Physical Exam   General appearance: alert and no distress Neck: JVD - 3 cm above sternal notch, no carotid bruit, and thyroid not enlarged, symmetric, no tenderness/mass/nodules Lungs: diminished breath sounds bibasilar Heart: irregularly irregular rhythm Abdomen: soft, non-tender; bowel sounds normal; no masses,  no organomegaly Extremities: extremities normal, atraumatic, no cyanosis or edema Pulses: 2+ and symmetric Skin: Skin color, texture, turgor normal. No rashes or lesions Neurologic: Mental status: Alert, oriented, thought content appropriate Psych: Anxious to leave  Inpatient Medications    Scheduled Meds:  aspirin EC  81 mg Oral Daily   atorvastatin  40 mg Oral Daily   carvedilol  6.25 mg Oral BID WC   enoxaparin (LOVENOX) injection  40 mg Subcutaneous Q24H   ferrous sulfate   325 mg Oral Q breakfast   furosemide  40 mg Intravenous Q12H   gabapentin  100 mg Oral TID   losartan  12.5 mg Oral Daily   pantoprazole  40 mg Oral Daily    Continuous Infusions:   PRN Meds: acetaminophen **OR** acetaminophen, traZODone   Labs   Results for orders placed or performed during the hospital encounter of 05/11/21 (from the past 48 hour(s))  Blood gas, arterial     Status: Abnormal   Collection Time: 05/15/21 12:13 PM  Result Value Ref Range   FIO2 36.00    O2 Content 4.0 L/min   pH, Arterial 7.476 (H) 7.350 - 7.450   pCO2 arterial 30.5 (L) 32.0 - 48.0 mmHg   pO2, Arterial 84.9 83.0 - 108.0 mmHg   Bicarbonate 22.2 20.0 - 28.0 mmol/L   Acid-base deficit 0.2 0.0 - 2.0 mmol/L   O2 Saturation 96.5 %   Patient temperature 98.6    Collection site RIGHT RADIAL    Drawn by [id number]    Sample type ARTERIAL     Comment: Performed at [facility], [address] [place], [place] [address]  Basic metabolic panel     Status: Abnormal   Collection Time: 05/16/21  4:26 AM  Result Value Ref Range   Sodium 138 135 - 145 mmol/L   Potassium 3.5 3.5 - 5.1 mmol/L   Chloride 103 98 - 111  mmol/L   CO2 27 22 - 32 mmol/L   Glucose, Bld 116 (H) 70 - 99 mg/dL    Comment: Glucose reference range applies only to samples taken after fasting for at least 8 hours.   BUN 58 (H) 6 - 20 mg/dL   Creatinine, Ser 0.38 (H) 0.61 - 1.24 mg/dL   Calcium 8.7 (L) 8.9 - 10.3 mg/dL   GFR, Estimated 44 (L) >60 mL/min    Comment: (NOTE) Calculated using the CKD-EPI Creatinine Equation (2021)    Anion gap 8 5 - 15    Comment: Performed at [facility], [address] [place], [place] [address]  Magnesium     Status: None   Collection Time: 05/16/21  4:26 AM  Result Value Ref Range   Magnesium 2.3 1.7 - 2.4 mg/dL    Comment: Performed at [facility], [address], [place], [place] [address]    ECG   N/A  Telemetry   N/A  Radiology     No results found.  Cardiac Studies   N/A  Assessment   Principal Problem:   Acute exacerbation of CHF (congestive heart failure) (HCC) Active Problems:   Essential hypertension   Acute pulmonary edema (HCC)   Cocaine abuse (HCC)   Chronic kidney disease, stage II (mild)   Hyperlipidemia   Coronary artery disease involving native coronary artery of native heart without angina pectoris   Hypokalemia   Plan   Mr.Riggins wishes to leave -he is somewhat improved from his heart failure from my perspective. High likelihood for re-admission - high risk for non-compliance and continued susbstance abuse. No room to restart Bidil or Aldactone - would continue with metoprolol and losartan. He would need follow-up with Dr. Elease Hashimoto in the next few weeks - would be good to get him some meds through inpatient [facility] pharmacy if possible before discharge to help ensure compliance.   Time Spent Directly with Patient:  I have spent a total of 25 minutes with the patient reviewing hospital notes, telemetry, EKGs, labs and examining the patient as well as establishing an assessment and plan that was discussed personally with the patient.  > 50% of time was spent in direct patient care.  Length of Stay:  LOS: 6 days   Chrystie Nose, MD, [facility], FACP    [facility]  Medical Director of the [facility] Lipid Disorders &  Cardiovascular Risk Reduction Clinic Diplomate of the American Board of Clinical Lipidology Attending Cardiologist  Direct Dial: [phone]  Fax: [phone]  Website:  [email or web address] Nicely Sabina Beavers 05/17/2021, 10:21 AM

## 2021-06-01 ENCOUNTER — Inpatient Hospital Stay
Admission: EM | Admit: 2021-06-01 | Discharge: 2021-06-05 | DRG: 291 | Discharge status: Against Medical Advice | Payer: Medicaid Other | Attending: Internal Medicine | Admitting: Internal Medicine

## 2021-06-01 ENCOUNTER — Emergency Department: Payer: Medicaid Other

## 2021-06-01 ENCOUNTER — Other Ambulatory Visit: Payer: Self-pay

## 2021-06-01 DIAGNOSIS — F1910 Other psychoactive substance abuse, uncomplicated: Secondary | ICD-10-CM | POA: Diagnosis present

## 2021-06-01 DIAGNOSIS — I5021 Acute systolic (congestive) heart failure: Secondary | ICD-10-CM | POA: Diagnosis not present

## 2021-06-01 DIAGNOSIS — T447X6A Underdosing of beta-adrenoreceptor antagonists, initial encounter: Secondary | ICD-10-CM | POA: Diagnosis present

## 2021-06-01 DIAGNOSIS — Z91120 Patient's intentional underdosing of medication regimen due to financial hardship: Secondary | ICD-10-CM

## 2021-06-01 DIAGNOSIS — N1831 Chronic kidney disease, stage 3a: Secondary | ICD-10-CM | POA: Diagnosis present

## 2021-06-01 DIAGNOSIS — F32A Depression, unspecified: Secondary | ICD-10-CM | POA: Diagnosis present

## 2021-06-01 DIAGNOSIS — E876 Hypokalemia: Secondary | ICD-10-CM | POA: Diagnosis present

## 2021-06-01 DIAGNOSIS — F1410 Cocaine abuse, uncomplicated: Secondary | ICD-10-CM | POA: Diagnosis present

## 2021-06-01 DIAGNOSIS — F419 Anxiety disorder, unspecified: Secondary | ICD-10-CM | POA: Diagnosis present

## 2021-06-01 DIAGNOSIS — D631 Anemia in chronic kidney disease: Secondary | ICD-10-CM | POA: Diagnosis present

## 2021-06-01 DIAGNOSIS — E785 Hyperlipidemia, unspecified: Secondary | ICD-10-CM | POA: Diagnosis present

## 2021-06-01 DIAGNOSIS — T501X6A Underdosing of loop [high-ceiling] diuretics, initial encounter: Secondary | ICD-10-CM | POA: Diagnosis present

## 2021-06-01 DIAGNOSIS — N183 Chronic kidney disease, stage 3 unspecified: Secondary | ICD-10-CM

## 2021-06-01 DIAGNOSIS — T454X6A Underdosing of iron and its compounds, initial encounter: Secondary | ICD-10-CM | POA: Diagnosis present

## 2021-06-01 DIAGNOSIS — E669 Obesity, unspecified: Secondary | ICD-10-CM | POA: Diagnosis present

## 2021-06-01 DIAGNOSIS — I255 Ischemic cardiomyopathy: Secondary | ICD-10-CM | POA: Diagnosis present

## 2021-06-01 DIAGNOSIS — I248 Other forms of acute ischemic heart disease: Secondary | ICD-10-CM | POA: Diagnosis present

## 2021-06-01 DIAGNOSIS — I428 Other cardiomyopathies: Secondary | ICD-10-CM | POA: Diagnosis present

## 2021-06-01 DIAGNOSIS — T465X6A Underdosing of other antihypertensive drugs, initial encounter: Secondary | ICD-10-CM | POA: Diagnosis present

## 2021-06-01 DIAGNOSIS — I10 Essential (primary) hypertension: Secondary | ICD-10-CM | POA: Diagnosis present

## 2021-06-01 DIAGNOSIS — T426X6A Underdosing of other antiepileptic and sedative-hypnotic drugs, initial encounter: Secondary | ICD-10-CM | POA: Diagnosis present

## 2021-06-01 DIAGNOSIS — Z885 Allergy status to narcotic agent status: Secondary | ICD-10-CM

## 2021-06-01 DIAGNOSIS — G629 Polyneuropathy, unspecified: Secondary | ICD-10-CM | POA: Diagnosis present

## 2021-06-01 DIAGNOSIS — F1290 Cannabis use, unspecified, uncomplicated: Secondary | ICD-10-CM | POA: Diagnosis present

## 2021-06-01 DIAGNOSIS — I2582 Chronic total occlusion of coronary artery: Secondary | ICD-10-CM | POA: Diagnosis present

## 2021-06-01 DIAGNOSIS — F1010 Alcohol abuse, uncomplicated: Secondary | ICD-10-CM | POA: Diagnosis present

## 2021-06-01 DIAGNOSIS — I5023 Acute on chronic systolic (congestive) heart failure: Secondary | ICD-10-CM

## 2021-06-01 DIAGNOSIS — I130 Hypertensive heart and chronic kidney disease with heart failure and stage 1 through stage 4 chronic kidney disease, or unspecified chronic kidney disease: Principal | ICD-10-CM | POA: Diagnosis present

## 2021-06-01 DIAGNOSIS — Z7982 Long term (current) use of aspirin: Secondary | ICD-10-CM

## 2021-06-01 DIAGNOSIS — I509 Heart failure, unspecified: Secondary | ICD-10-CM

## 2021-06-01 DIAGNOSIS — I472 Ventricular tachycardia: Secondary | ICD-10-CM | POA: Diagnosis not present

## 2021-06-01 DIAGNOSIS — Z5329 Procedure and treatment not carried out because of patient's decision for other reasons: Secondary | ICD-10-CM | POA: Diagnosis not present

## 2021-06-01 DIAGNOSIS — I440 Atrioventricular block, first degree: Secondary | ICD-10-CM | POA: Diagnosis present

## 2021-06-01 DIAGNOSIS — Z8249 Family history of ischemic heart disease and other diseases of the circulatory system: Secondary | ICD-10-CM

## 2021-06-01 DIAGNOSIS — Z6836 Body mass index (BMI) 36.0-36.9, adult: Secondary | ICD-10-CM

## 2021-06-01 DIAGNOSIS — I214 Non-ST elevation (NSTEMI) myocardial infarction: Secondary | ICD-10-CM

## 2021-06-01 DIAGNOSIS — Z20822 Contact with and (suspected) exposure to covid-19: Secondary | ICD-10-CM | POA: Diagnosis present

## 2021-06-01 DIAGNOSIS — I2510 Atherosclerotic heart disease of native coronary artery without angina pectoris: Secondary | ICD-10-CM | POA: Diagnosis present

## 2021-06-01 DIAGNOSIS — T466X6A Underdosing of antihyperlipidemic and antiarteriosclerotic drugs, initial encounter: Secondary | ICD-10-CM | POA: Diagnosis present

## 2021-06-01 DIAGNOSIS — Z79899 Other long term (current) drug therapy: Secondary | ICD-10-CM

## 2021-06-01 DIAGNOSIS — I5043 Acute on chronic combined systolic (congestive) and diastolic (congestive) heart failure: Secondary | ICD-10-CM | POA: Diagnosis present

## 2021-06-01 HISTORY — DX: Patient's noncompliance with other medical treatment and regimen: Z91.19

## 2021-06-01 HISTORY — DX: Morbid (severe) obesity due to excess calories: E66.01

## 2021-06-01 HISTORY — DX: Hyperlipidemia, unspecified: E78.5

## 2021-06-01 HISTORY — DX: Anemia, unspecified: D64.9

## 2021-06-01 HISTORY — DX: Patient's noncompliance with other medical treatment and regimen due to unspecified reason: Z91.199

## 2021-06-01 HISTORY — DX: Atherosclerotic heart disease of native coronary artery without angina pectoris: I25.10

## 2021-06-01 HISTORY — DX: Ischemic cardiomyopathy: I25.5

## 2021-06-01 HISTORY — DX: Chronic kidney disease, stage 2 (mild): N18.2

## 2021-06-01 LAB — APTT: aPTT: 30 seconds (ref 24–36)

## 2021-06-01 LAB — URINE DRUG SCREEN, QUALITATIVE (ARMC ONLY)
Amphetamines, Ur Screen: NOT DETECTED
Barbiturates, Ur Screen: NOT DETECTED
Benzodiazepine, Ur Scrn: NOT DETECTED
Cannabinoid 50 Ng, Ur ~~LOC~~: POSITIVE — AB
Cocaine Metabolite,Ur ~~LOC~~: NOT DETECTED
MDMA (Ecstasy)Ur Screen: NOT DETECTED
Methadone Scn, Ur: NOT DETECTED
Opiate, Ur Screen: NOT DETECTED
Phencyclidine (PCP) Ur S: NOT DETECTED
Tricyclic, Ur Screen: NOT DETECTED

## 2021-06-01 LAB — CBC WITH DIFFERENTIAL/PLATELET
Abs Immature Granulocytes: 0.01 10*3/uL (ref 0.00–0.07)
Basophils Absolute: 0 10*3/uL (ref 0.0–0.1)
Basophils Relative: 1 %
Eosinophils Absolute: 0.1 10*3/uL (ref 0.0–0.5)
Eosinophils Relative: 1 %
HCT: 37.6 % — ABNORMAL LOW (ref 39.0–52.0)
Hemoglobin: 12.7 g/dL — ABNORMAL LOW (ref 13.0–17.0)
Immature Granulocytes: 0 %
Lymphocytes Relative: 17 %
Lymphs Abs: 1 10*3/uL (ref 0.7–4.0)
MCH: 29.3 pg (ref 26.0–34.0)
MCHC: 33.8 g/dL (ref 30.0–36.0)
MCV: 86.8 fL (ref 80.0–100.0)
Monocytes Absolute: 0.6 10*3/uL (ref 0.1–1.0)
Monocytes Relative: 10 %
Neutro Abs: 4.3 10*3/uL (ref 1.7–7.7)
Neutrophils Relative %: 71 %
Platelets: 277 10*3/uL (ref 150–400)
RBC: 4.33 MIL/uL (ref 4.22–5.81)
RDW: 16.4 % — ABNORMAL HIGH (ref 11.5–15.5)
WBC: 6 10*3/uL (ref 4.0–10.5)
nRBC: 0 % (ref 0.0–0.2)

## 2021-06-01 LAB — RESP PANEL BY RT-PCR (FLU A&B, COVID) ARPGX2
Influenza A by PCR: NEGATIVE
Influenza B by PCR: NEGATIVE
SARS Coronavirus 2 by RT PCR: NEGATIVE

## 2021-06-01 LAB — COMPREHENSIVE METABOLIC PANEL
ALT: 30 U/L (ref 0–44)
AST: 30 U/L (ref 15–41)
Albumin: 3.7 g/dL (ref 3.5–5.0)
Alkaline Phosphatase: 92 U/L (ref 38–126)
Anion gap: 9 (ref 5–15)
BUN: 27 mg/dL — ABNORMAL HIGH (ref 6–20)
CO2: 28 mmol/L (ref 22–32)
Calcium: 9.1 mg/dL (ref 8.9–10.3)
Chloride: 106 mmol/L (ref 98–111)
Creatinine, Ser: 1.4 mg/dL — ABNORMAL HIGH (ref 0.61–1.24)
GFR, Estimated: 59 mL/min — ABNORMAL LOW (ref >60)
Glucose, Bld: 97 mg/dL (ref 70–99)
Potassium: 3.6 mmol/L (ref 3.5–5.1)
Sodium: 143 mmol/L (ref 135–145)
Total Bilirubin: 1.8 mg/dL — ABNORMAL HIGH (ref 0.3–1.2)
Total Protein: 7.4 g/dL (ref 6.5–8.1)

## 2021-06-01 LAB — MAGNESIUM: Magnesium: 2.3 mg/dL (ref 1.7–2.4)

## 2021-06-01 LAB — TROPONIN I (HIGH SENSITIVITY)
Troponin I (High Sensitivity): 1279 ng/L (ref <18)
Troponin I (High Sensitivity): 1127 ng/L (ref <18)

## 2021-06-01 LAB — BRAIN NATRIURETIC PEPTIDE: B Natriuretic Peptide: 4001.7 pg/mL — ABNORMAL HIGH (ref 0.0–100.0)

## 2021-06-01 LAB — PROTIME-INR
INR: 1.2 (ref 0.8–1.2)
Prothrombin Time: 15 seconds (ref 11.4–15.2)

## 2021-06-01 MED ORDER — ACETAMINOPHEN 650 MG RE SUPP: 650.0000 mg | Freq: Four times a day (QID) | RECTAL | Status: AC | PRN

## 2021-06-01 MED ORDER — FUROSEMIDE 10 MG/ML IJ SOLN
60.0000 mg | Freq: Once | INTRAMUSCULAR | Status: AC
  Administered 2021-06-01: 60 mg via INTRAVENOUS
  Filled 2021-06-01: qty 8

## 2021-06-01 MED ORDER — ENOXAPARIN SODIUM 40 MG/0.4ML IJ SOSY: 40.0000 mg | PREFILLED_SYRINGE | INTRAMUSCULAR | Status: DC

## 2021-06-01 MED ORDER — HEPARIN BOLUS VIA INFUSION
4000.0000 [IU] | Freq: Once | INTRAVENOUS | Status: AC
  Administered 2021-06-01: 4000 [IU] via INTRAVENOUS
  Filled 2021-06-01: qty 4000

## 2021-06-01 MED ORDER — ATORVASTATIN CALCIUM 20 MG PO TABS
40.0000 mg | ORAL_TABLET | Freq: Every day | ORAL | Status: DC
  Administered 2021-06-02 – 2021-06-05 (×5): 40 mg via ORAL
  Filled 2021-06-01 (×5): qty 2

## 2021-06-01 MED ORDER — FUROSEMIDE 10 MG/ML IJ SOLN
80.0000 mg | Freq: Two times a day (BID) | INTRAMUSCULAR | Status: DC
  Administered 2021-06-02: 80 mg via INTRAVENOUS
  Filled 2021-06-01: qty 8

## 2021-06-01 MED ORDER — HEPARIN (PORCINE) 25000 UT/250ML-% IV SOLN
1650.0000 [IU]/h | INTRAVENOUS | Status: AC
  Administered 2021-06-01: 1300 [IU]/h via INTRAVENOUS
  Administered 2021-06-02 – 2021-06-03 (×2): 1650 [IU]/h via INTRAVENOUS
  Filled 2021-06-01 (×4): qty 250

## 2021-06-01 MED ORDER — ACETAMINOPHEN 325 MG PO TABS: 650.0000 mg | ORAL_TABLET | Freq: Four times a day (QID) | ORAL | Status: AC | PRN

## 2021-06-01 MED ORDER — ASPIRIN EC 81 MG PO TBEC
81.0000 mg | DELAYED_RELEASE_TABLET | Freq: Every day | ORAL | Status: DC
  Administered 2021-06-02 – 2021-06-05 (×5): 81 mg via ORAL
  Filled 2021-06-01 (×5): qty 1

## 2021-06-01 MED ORDER — POTASSIUM CHLORIDE CRYS ER 20 MEQ PO TBCR
20.0000 meq | EXTENDED_RELEASE_TABLET | Freq: Every day | ORAL | Status: DC
  Administered 2021-06-02 – 2021-06-05 (×5): 20 meq via ORAL
  Filled 2021-06-01 (×5): qty 1

## 2021-06-01 MED ORDER — ONDANSETRON HCL 4 MG/2ML IJ SOLN
4.0000 mg | Freq: Four times a day (QID) | INTRAMUSCULAR | Status: DC | PRN
Start: 2021-06-01 — End: 2021-06-05

## 2021-06-01 MED ORDER — ONDANSETRON HCL 4 MG PO TABS
4.0000 mg | ORAL_TABLET | Freq: Four times a day (QID) | ORAL | Status: DC | PRN
Start: 2021-06-01 — End: 2021-06-05

## 2021-06-01 MED ORDER — CARVEDILOL 6.25 MG PO TABS
6.2500 mg | ORAL_TABLET | Freq: Two times a day (BID) | ORAL | Status: DC
  Administered 2021-06-02 – 2021-06-05 (×7): 6.25 mg via ORAL
  Filled 2021-06-01 (×7): qty 1

## 2021-06-01 MED ORDER — FUROSEMIDE 10 MG/ML IJ SOLN
80.0000 mg | Freq: Once | INTRAMUSCULAR | Status: AC
  Administered 2021-06-01: 80 mg via INTRAVENOUS
  Filled 2021-06-01: qty 8

## 2021-06-01 MED ORDER — LOSARTAN POTASSIUM 25 MG PO TABS
12.5000 mg | ORAL_TABLET | Freq: Every day | ORAL | Status: DC
  Administered 2021-06-02 – 2021-06-03 (×2): 12.5 mg via ORAL
  Filled 2021-06-01 (×2): qty 0.5
  Filled 2021-06-01: qty 1

## 2021-06-01 MED ORDER — ASPIRIN 81 MG PO CHEW
324.0000 mg | CHEWABLE_TABLET | Freq: Once | ORAL | Status: AC
  Administered 2021-06-01: 324 mg via ORAL
  Filled 2021-06-01: qty 4

## 2021-06-01 NOTE — ED Notes (Signed)
Report given to [facility] RN

## 2021-06-01 NOTE — Consult Note (Signed)
ANTICOAGULATION CONSULT NOTE  Pharmacy Consult for heparin Indication: chest pain/ACS  Allergies  Allergen Reactions   Hydrocodone Itching    Patient Measurements: Height: 5\' 11"  (180.3 cm) Weight: 122.5 kg (270 lb) IBW/kg (Calculated) : 75.3 Heparin Dosing Weight: 102.6 kg  Vital Signs: Temp: 98.2 F (36.8 C) (07/29 1946) Temp Source: Oral (07/29 1946) BP: 163/98 (07/29 1943) Pulse Rate: 102 (07/29 1943)  Labs: Recent Labs    06/01/21 2000  CREATININE 1.40*  TROPONINIHS 1,279*    Estimated Creatinine Clearance: 77.6 mL/min (A) (by C-G formula based on SCr of 1.4 mg/dL (H)).   Medical History: Past Medical History:  Diagnosis Date   Alcohol abuse    Chronic combined systolic and diastolic CHF (congestive heart failure) (HCC)    a. 09/27/18 showed mild LVH, EF 20-25%, grade 2 DD, mild MR, severely dilated LA, mildly dilated RV with mildly reduced RV function, mod RAE.   Cocaine abuse (HCC)    Hypertension     Medications:  No PTA anticoagulation  Assessment: 60 y.o. male with history of CHF, HTN, and HLD who presented to the ED with worsening shortness of breath, orthopnea, and swelling in his legs. Pharmacy has been consulted for heparin dosing for ACS. Heparin Dosing Weight: 102.6 kg Hgb: 12.7; Plts:277  Goal of Therapy:  Heparin level 0.3-0.7 units/ml Monitor platelets by anticoagulation protocol: Yes   Plan:  Give 4000 units bolus x 1 Start heparin infusion at 1300 units/hr Check anti-Xa level in 6 hours and daily while on heparin Continue to monitor H&H and platelets  60, PharmD 06/01/2021,9:04 PM

## 2021-06-01 NOTE — H&P (Addendum)
History and Physical   Jushua Keith Genco MRN:[id number] DOB: 07/11/1961 DOA: 06/01/2021  PCP: Nahser, Philip J, MD  Patient coming from: home via Uber  I have personally briefly reviewed patient's old medical records in [place] EMR.  Chief Concern: Shortness of breath  HPI: Dale [name] is a 60 y.o. male with medical history significant for hyperlipidemia, hypertension, neuropathy, severe heart failure, history of cocaine use, CKD 3A, presents emergency department for chief concerns of shortness of breath.  He states that he has not been able to take his medications for about 1 month.  He states that the medication are too expensive for him.  He denies any fever, nausea, vomiting, chest pain, abdominal pain, dysuria, hematuria, diarrhea.  At bedside he states that he has not had any fluid restriction.  He wanted the nurse to give him ginger ale with ice and a turkey sandwich.  Social history: He lives at his daughter's house.  He denies tobacco use.  He denies EtOH use.  He endorses history of cocaine use.  He states that he no longer uses cocaine.  He endorses THC smoking.  Vaccination history: He received 1 Pfizer vaccine for COVID-19.  He refused to have any other due to the symptoms that he was not able to walk and generalized weakness for 3 days.  ROS: Constitutional: + weight change, no fever ENT/Mouth: no sore throat, no rhinorrhea Eyes: no eye pain, no vision changes Cardiovascular: no chest pain, + dyspnea,  no edema, no palpitations Respiratory: no cough, no sputum, no wheezing Gastrointestinal: no nausea, no vomiting, no diarrhea, no constipation Genitourinary: no urinary incontinence, no dysuria, no hematuria Musculoskeletal: no arthralgias, no myalgias Skin: no skin lesions, no pruritus, Neuro: + weakness, no loss of consciousness, no syncope Psych: no anxiety, no depression, no decrease appetite Heme/Lymph: no bruising, no bleeding  ED Course: Discussed  with emergency medicine provider, patient requiring hospitalization for heart failure exacerbation.  Vitals in the emergency department was remarkable for temperature 98.2, respiration rate of 20, heart rate 102, blood pressure 163/98, SPO2 of 98% on room air.  Labs in the emergency department was remarkable for sodium 143, potassium 3.6, chloride 106, bicarb 28, BUN 27, serum creatinine 1.40, nonfasting blood glucose 97, EGFR 59, troponin 1279, CBC is pending.  ED provider ordered aspirin 324 mg and gave patient furosemide 80 mg IV once, heparin GTT.  Assessment/Plan  Principal Problem:   Acute systolic heart failure (HCC) Active Problems:   Essential hypertension   Obesity (BMI 30-39.9)   Substance abuse (HCC)   Anxiety and depression   Acute CHF (congestive heart failure) (HCC)   CKD (chronic kidney disease), stage III (HCC)   # Shortness of breath secondary to acute heart failure exacerbation - Patient has not been taking his prescribed fluid medication - He states that he cannot afford his fluid medication - Status post Lasix 80 mg IV per EDP and approximately 1 hour after this dose of Lasix, patient urinated approximately 250 mL of dark yellow urine - Lasix 80 mg IV twice daily scheduled for first dose on 06/02/2021 - Ordered additional dose of furosemide 60 mg IV once - Carvedilol 6.25 mg p.o. twice daily resumed - Torsemide 40 mg daily has not been resumed - ED provider consulted cardiology, Dr. Christopher End who states that he will see the patient - Strict I's and O's - Heart healthy diet, 1500 mL fluid restriction - Admit to progressive cardiac, observation, with telemetry  # Elevated troponin-continue   heparin GTT - I suspect this is secondary to heart strain from severe heart failure exacerbation in setting of medication noncompliance  # Hyperlipidemia-atorvastatin 40 mg daily resumed  # Hypertension-losartan 12.5 mg p.o. daily resumed  Chart reviewed.   05/12/2021  Echocardiogram: showed ejection fraction less than 82%, grade 2 diastolic dysfunction  DVT prophylaxis: Heparin GTT Code Status: Full code Diet: Heart healthy Family Communication: No Disposition Plan: Pending clinical course Consults called: Cardiology Admission status: Progressive cardiac, observation, telemetry  Past Medical History:  Diagnosis Date   Alcohol abuse    Chronic combined systolic and diastolic CHF (congestive heart failure) ([place])    a. 09/27/18 showed mild LVH, EF 20-25%, grade 2 DD, mild MR, severely dilated LA, mildly dilated RV with mildly reduced RV function, mod RAE.   Cocaine abuse ([place])    Hypertension    Past Surgical History:  Procedure Laterality Date   RIGHT/LEFT HEART CATH AND CORONARY ANGIOGRAPHY N/A 05/10/2019   Procedure: RIGHT/LEFT HEART CATH AND CORONARY ANGIOGRAPHY;  Surgeon: Nelva Bush, MD;  Location: [facility] [place] [facility];  Service: Cardiovascular;  Laterality: N/A;   Social History:  reports that he has never smoked. He has never used smokeless tobacco. He reports current alcohol use. He reports current drug use. Frequency: 3.00 times per week. Drugs: Marijuana and Cocaine.  Allergies  Allergen Reactions   Hydrocodone Itching   Family History  Problem Relation Age of Onset   Hypertension Maternal Grandmother    Family history: Family history reviewed and not pertinent  Prior to Admission medications   Medication Sig Start Date End Date Taking? Authorizing Provider  aspirin EC 81 MG EC tablet Take 1 tablet (81 mg total) by mouth daily. Swallow whole. 12/17/20   Lorella Nimrod, MD  atorvastatin (LIPITOR) 40 MG tablet Take 1 tablet (40 mg total) by mouth daily. 05/17/21 06/16/21  Antonieta Pert, MD  carvedilol (COREG) 6.25 MG tablet Take 1 tablet (6.25 mg total) by mouth 2 (two) times daily with a meal. 05/17/21 06/16/21  Antonieta Pert, MD  diclofenac sodium (VOLTAREN) 1 % GEL Apply 2 g topically 4 (four) times daily as needed (pain).    [provider]  ferrous sulfate 325 (65 FE) MG EC tablet Take 325 mg by mouth daily with breakfast.    [provider]  gabapentin (NEURONTIN) 300 MG capsule Take 1 capsule (300 mg total) by mouth at bedtime. Patient not taking: Reported on 05/11/2021 10/21/20   Dessa Phi, DO  losartan (COZAAR) 25 MG tablet Take 0.5 tablets (12.5 mg total) by mouth daily. 05/17/21   Antonieta Pert, MD  Torsemide 40 MG TABS Take 40 mg by mouth daily at 6 (six) AM. 05/17/21 06/16/21  Antonieta Pert, MD   Physical Exam: Vitals:   06/01/21 1946 06/01/21 2100 06/01/21 2115 06/01/21 2130  BP:  (!) 119/93    Pulse:  94 99 99  Resp:  14 (!) 27 (!) 25  Temp: 98.2 F (36.8 C)     TempSrc: Oral     SpO2:  96% 96% 98%  Weight:      Height:       Constitutional: appears age-appropriate, NAD, calm, comfortable Eyes: PERRL, lids and conjunctivae normal ENMT: Mucous membranes are moist. Posterior pharynx clear of any exudate or lesions. Age-appropriate dentition. Hearing appropriate Neck: normal, supple, no masses, no thyromegaly Respiratory: clear to auscultation bilaterally, no wheezing, no crackles. Normal respiratory effort. Mild increase accessory muscle use.  Cardiovascular: Regular rate and rhythm, no murmurs /  rubs / gallops. 3+ bilateral pitting extremity edema. 2+ pedal pulses. No carotid bruits.  Abdomen: no tenderness, no masses palpated, no hepatosplenomegaly. Bowel sounds positive.  Musculoskeletal: no clubbing / cyanosis. No joint deformity upper and lower extremities. Good ROM, no contractures, no atrophy. Normal muscle tone.  Skin: no rashes, lesions, ulcers. No induration Neurologic: Sensation intact. Strength 5/5 in all 4.  Psychiatric: Normal judgment and insight. Alert and oriented x 3. Normal mood.   EKG: independently reviewed, showing sinus rhythm with rate of 93, first-degree AV block, occasional PVCs, QTC 489  Chest x-ray on Admission: I personally reviewed and I agree with radiologist  reading as below.  DG Chest 2 View  Result Date: 06/01/2021 CLINICAL DATA:  Shortness of breath EXAM: CHEST - 2 VIEW COMPARISON:  05/25/2021 FINDINGS: Cardiomegaly, vascular congestion. No overt edema. No confluent opacities or effusions. No acute bony abnormality. IMPRESSION: Cardiomegaly, vascular congestion. Electronically Signed   By: Kevin  Dover M.D.   On: 06/01/2021 20:07    Labs on Admission: I have personally reviewed following labs  CBC: Recent Labs  Lab 06/01/21 2109  WBC 6.0  NEUTROABS 4.3  HGB 12.7*  HCT 37.6*  MCV 86.8  PLT 277   Basic Metabolic Panel: Recent Labs  Lab 06/01/21 2000  NA 143  K 3.6  CL 106  CO2 28  GLUCOSE 97  BUN 27*  CREATININE 1.40*  CALCIUM 9.1  MG 2.3   GFR: Estimated Creatinine Clearance: 77.6 mL/min (A) (by C-G formula based on SCr of 1.4 mg/dL (H)).  Liver Function Tests: Recent Labs  Lab 06/01/21 2000  AST 30  ALT 30  ALKPHOS 92  BILITOT 1.8*  PROT 7.4  ALBUMIN 3.7   Urine analysis:    Component Value Date/Time   COLORURINE STRAW (A) 12/25/2020 1143   APPEARANCEUR CLEAR (A) 12/25/2020 1143   LABSPEC 1.006 12/25/2020 1143   PHURINE 7.0 12/25/2020 1143   GLUCOSEU NEGATIVE 12/25/2020 1143   HGBUR NEGATIVE 12/25/2020 1143   BILIRUBINUR NEGATIVE 12/25/2020 1143   KETONESUR NEGATIVE 12/25/2020 1143   PROTEINUR NEGATIVE 12/25/2020 1143   UROBILINOGEN 0.2 03/11/2016 0934   NITRITE NEGATIVE 12/25/2020 1143   LEUKOCYTESUR NEGATIVE 12/25/2020 1143   Dr.  [facility]  If 7PM-7AM, please contact overnight-coverage provider If 7AM-7PM, please contact day coverage provider [email or web address]  06/01/2021, 9:55 PM   

## 2021-06-01 NOTE — ED Provider Notes (Signed)
[facility] Emergency Department Provider Note  ____________________________________________   Event Date/Time   First MD Initiated Contact with Patient 06/01/21 2044     (approximate)  I have reviewed the triage vital signs and the nursing notes.   HISTORY  Chief Complaint Shortness of Breath   HPI Dale [name] is a 60 y.o. male with history of, chronic systolic and diastolic CHF cocaine use disorder alcohol use, hypertension and recent admission 7/8-7/14 for acute CHF exacerbation who presents for assessment stating he has had worsening shortness of breath and orthopnea and swelling in his legs in the last couple days.  States he has been taking his Lasix.  He denies any recent cocaine use or alcohol use.  He denies any fevers, cough, chest pain, abdominal pain, nausea, vomiting, diarrhea, dysuria, rash headache earache sore throat or any other clear associated sick symptoms.  Denies any other acute concerns at this time.          Past Medical History:  Diagnosis Date   Alcohol abuse    Chronic combined systolic and diastolic CHF (congestive heart failure) (HCC)    a. 09/27/18 showed mild LVH, EF 20-25%, grade 2 DD, mild MR, severely dilated LA, mildly dilated RV with mildly reduced RV function, mod RAE.   Cocaine abuse (HCC)    Hypertension     Patient Active Problem List   Diagnosis Date Noted   CKD (chronic kidney disease), stage III (HCC) 06/01/2021   Hypokalemia    Acute on chronic systolic CHF (congestive heart failure) (HCC) 12/25/2020   Coronary artery disease involving native coronary artery of native heart without angina pectoris    Acute kidney injury superimposed on CKD (HCC) 10/16/2020   Acute on chronic combined systolic and diastolic HF (heart failure) (HCC) 09/08/2020   Acute on chronic combined systolic (congestive) and diastolic (congestive) heart failure (HCC)    Acute CHF (congestive heart failure) (HCC) 05/07/2019    Nausea & vomiting 05/07/2019   Acute exacerbation of CHF (congestive heart failure) (HCC) 03/11/2019   Troponin level elevated 03/11/2019   Hyperlipidemia 03/03/2019   NSVT (nonsustained ventricular tachycardia) (HCC) 03/03/2019   Anxiety and depression    NSTEMI (non-ST elevated myocardial infarction) (HCC) 02/28/2019   Acute systolic heart failure (HCC) 11/08/2018   Hypertensive urgency 09/28/2018   Acute pulmonary edema (HCC) 09/28/2018   Cocaine abuse (HCC) 09/28/2018   Substance abuse (HCC) 09/28/2018   Chronic kidney disease, stage II (mild) 09/28/2018   Normochromic anemia 09/28/2018   Acute systolic CHF (congestive heart failure) (HCC) 09/27/2018   Essential hypertension 03/11/2016   Obesity (BMI 30-39.9) 03/11/2016   Glucosuria 03/11/2016    Past Surgical History:  Procedure Laterality Date   RIGHT/LEFT HEART CATH AND CORONARY ANGIOGRAPHY N/A 05/10/2019   Procedure: RIGHT/LEFT HEART CATH AND CORONARY ANGIOGRAPHY;  Surgeon: Yvonne Kendall, MD;  Location: [facility] INVASIVE CV LAB;  Service: Cardiovascular;  Laterality: N/A;    Prior to Admission medications   Medication Sig Start Date End Date Taking? Authorizing Provider  aspirin EC 81 MG EC tablet Take 1 tablet (81 mg total) by mouth daily. Swallow whole. Patient not taking: Reported on 06/01/2021 12/17/20   Arnetha Courser, MD  atorvastatin (LIPITOR) 40 MG tablet Take 1 tablet (40 mg total) by mouth daily. Patient not taking: Reported on 06/01/2021 05/17/21 06/16/21  Lanae Boast, MD  carvedilol (COREG) 6.25 MG tablet Take 1 tablet (6.25 mg total) by mouth 2 (two) times daily with a meal. Patient not taking:  Reported on 06/01/2021 05/17/21 06/16/21  Lanae Boast, MD  diclofenac sodium (VOLTAREN) 1 % GEL Apply 2 g topically 4 (four) times daily as needed (pain). Patient not taking: Reported on 06/01/2021    [provider]  ferrous sulfate 325 (65 FE) MG EC tablet Take 325 mg by mouth daily with breakfast. Patient not taking:  Reported on 06/01/2021    [provider]  gabapentin (NEURONTIN) 300 MG capsule Take 1 capsule (300 mg total) by mouth at bedtime. Patient not taking: No sig reported 10/21/20   Noralee Stain, DO  losartan (COZAAR) 25 MG tablet Take 0.5 tablets (12.5 mg total) by mouth daily. Patient not taking: Reported on 06/01/2021 05/17/21   Lanae Boast, MD  potassium chloride SA (KLOR-CON) 20 MEQ tablet Take 20 mEq by mouth daily.    [provider]  Torsemide 40 MG TABS Take 40 mg by mouth daily at 6 (six) AM. Patient not taking: Reported on 06/01/2021 05/17/21 06/16/21  Lanae Boast, MD    Allergies Hydrocodone  Family History  Problem Relation Age of Onset   Hypertension Maternal Grandmother     Social History Social History   Tobacco Use   Smoking status: Never   Smokeless tobacco: Never  Vaping Use   Vaping Use: Never used  Substance Use Topics   Alcohol use: Yes    Comment: 2 quarts a week    Drug use: Yes    Frequency: 3.0 times per week    Types: Marijuana, Cocaine    Review of Systems  Review of Systems  Constitutional:  Negative for chills and fever.  HENT:  Negative for sore throat.   Eyes:  Negative for pain.  Respiratory:  Positive for shortness of breath. Negative for cough and stridor.   Cardiovascular:  Positive for orthopnea. Negative for chest pain.  Gastrointestinal:  Negative for vomiting.  Genitourinary:  Negative for dysuria.  Musculoskeletal:  Negative for myalgias.  Skin:  Negative for rash.  Neurological:  Negative for seizures, loss of consciousness and headaches.  Psychiatric/Behavioral:  Negative for suicidal ideas.   All other systems reviewed and are negative.    ____________________________________________   PHYSICAL EXAM:  VITAL SIGNS: ED Triage Vitals  Enc Vitals Group     BP 06/01/21 1943 (!) 163/98     Pulse Rate 06/01/21 1943 (!) 102     Resp 06/01/21 1943 20     Temp 06/01/21 1946 98.2 F (36.8 C)     Temp Source  06/01/21 1946 Oral     SpO2 06/01/21 1943 98 %     Weight 06/01/21 1944 270 lb (122.5 kg)     Height 06/01/21 1944 5\' 11"  (1.803 m)     Head Circumference --      Peak Flow --      Pain Score 06/01/21 1944 0     Pain Loc --      Pain Edu? --      Excl. in GC? --    Vitals:   06/01/21 2115 06/01/21 2130  BP:    Pulse: 99 99  Resp: (!) 27 (!) 25  Temp:    SpO2: 96% 98%   Physical Exam Vitals and nursing note reviewed.  Constitutional:      Appearance: He is well-developed. He is ill-appearing.  HENT:     Head: Normocephalic and atraumatic.     Right Ear: External ear normal.     Left Ear: External ear normal.     Nose: Nose  normal.  Eyes:     Conjunctiva/sclera: Conjunctivae normal.  Cardiovascular:     Rate and Rhythm: Regular rhythm. Tachycardia present.     Heart sounds: No murmur heard. Pulmonary:     Effort: Pulmonary effort is normal. No respiratory distress.     Breath sounds: Decreased breath sounds and rales present.  Abdominal:     Palpations: Abdomen is soft.     Tenderness: There is no abdominal tenderness.  Musculoskeletal:     Cervical back: Neck supple.     Right lower leg: No edema.     Left lower leg: No edema.  Skin:    General: Skin is warm and dry.     Capillary Refill: Capillary refill takes less than 2 seconds.  Neurological:     General: No focal deficit present.     Mental Status: He is alert and oriented to person, place, and time.  Psychiatric:        Mood and Affect: Mood normal.     ____________________________________________   LABS (all labs ordered are listed, but only abnormal results are displayed)  Labs Reviewed  COMPREHENSIVE METABOLIC PANEL - Abnormal; Notable for the following components:      Result Value   BUN 27 (*)    Creatinine, Ser 1.40 (*)    Total Bilirubin 1.8 (*)    GFR, Estimated 59 (*)    All other components within normal limits  URINE DRUG SCREEN, QUALITATIVE ([facility] ONLY) - Abnormal; Notable for the  following components:   Cannabinoid 50 Ng, Ur Duncan POSITIVE (*)    All other components within normal limits  CBC WITH DIFFERENTIAL/PLATELET - Abnormal; Notable for the following components:   Hemoglobin 12.7 (*)    HCT 37.6 (*)    RDW 16.4 (*)    All other components within normal limits  BRAIN NATRIURETIC PEPTIDE - Abnormal; Notable for the following components:   B Natriuretic Peptide 4,001.7 (*)    All other components within normal limits  TROPONIN I (HIGH SENSITIVITY) - Abnormal; Notable for the following components:   Troponin I (High Sensitivity) 1,279 (*)    All other components within normal limits  TROPONIN I (HIGH SENSITIVITY) - Abnormal; Notable for the following components:   Troponin I (High Sensitivity) 1,127 (*)    All other components within normal limits  RESP PANEL BY RT-PCR (FLU A&B, COVID) ARPGX2  MAGNESIUM  APTT  PROTIME-INR  BASIC METABOLIC PANEL  CBC  HEPARIN LEVEL (UNFRACTIONATED)   ____________________________________________  EKG  Sinus rhythm with first-degree AV block with parable of 228, ventricular rate of 93, left axis deviation, nonspecific ST changes in inferior lateral leads I and aVL without other clearance of acute ischemia or significant arrhythmia. ____________________________________________  RADIOLOGY  ED MD interpretation: Chest x-ray shows cardiomegaly and pulmonary edema without focal consolidation, large effusion or pneumothorax.  Official radiology report(s): DG Chest 2 View  Result Date: 06/01/2021 CLINICAL DATA:  Shortness of breath EXAM: CHEST - 2 VIEW COMPARISON:  05/25/2021 FINDINGS: Cardiomegaly, vascular congestion. No overt edema. No confluent opacities or effusions. No acute bony abnormality. IMPRESSION: Cardiomegaly, vascular congestion. Electronically Signed   By: Charlett Nose M.D.   On: 06/01/2021 20:07    ____________________________________________   PROCEDURES  Procedure(s) performed (including Critical  Care):  .Critical Care  Date/Time: 06/02/2021 12:07 AM Performed by: Gilles Chiquito, MD Authorized by: Gilles Chiquito, MD   Critical care provider statement:    Critical care time (minutes):  45   Critical  care was necessary to treat or prevent imminent or life-threatening deterioration of the following conditions:  Cardiac failure   Critical care was time spent personally by me on the following activities:  Discussions with consultants, evaluation of patient's response to treatment, examination of patient, ordering and performing treatments and interventions, ordering and review of laboratory studies, ordering and review of radiographic studies, pulse oximetry, re-evaluation of patient's condition, obtaining history from patient or surrogate and review of old charts   ____________________________________________   INITIAL IMPRESSION / ASSESSMENT AND PLAN / ED COURSE      Patient presents with above to history exam for assessment of worsening shortness of breath orthopnea and swelling his legs the last couple days.  Differential includes acute heart failure exacerbation, pneumonia, ACS, arrhythmia, anemia, metabolic derangements and acute valvulopathy.  Chest x-ray shows cardiomegaly and pulmonary edema without focal consolidation, large effusion or pneumothorax.  CMP shows no significant electrolyte or metabolic derangements.  Kidney function is at baseline.  Initial troponin is elevated at 1279.  CBC shows no leukocytosis or acute anemia or thrombocytopenia.  Low suspicion for acute symptomatic anemia.  BNP is quite elevated at 4000 compared to 3703 weeks ago.  Suspect likely demand NSTEMI in the setting of heart failure exacerbation as patient appears fairly volume overloaded on exam and chest x-ray.  We will give 80 of Lasix as well as ASA and admit to hospitalist service.  Also discussed with on-call cardiologist Dr. Okey Dupre stated that given patient is otherwise stable could be  taken care of at this hospital and does not require transfer at this time.    ____________________________________________   FINAL CLINICAL IMPRESSION(S) / ED DIAGNOSES  Final diagnoses:  NSTEMI (non-ST elevated myocardial infarction) (HCC)  Acute on chronic congestive heart failure, unspecified heart failure type (HCC)    Medications  heparin ADULT infusion 100 units/mL (25000 units/28mL) (1,300 Units/hr Intravenous New Bag/Given 06/01/21 2157)  atorvastatin (LIPITOR) tablet 40 mg (has no administration in time range)  acetaminophen (TYLENOL) tablet 650 mg (has no administration in time range)    Or  acetaminophen (TYLENOL) suppository 650 mg (has no administration in time range)  ondansetron (ZOFRAN) tablet 4 mg (has no administration in time range)    Or  ondansetron (ZOFRAN) injection 4 mg (has no administration in time range)  furosemide (LASIX) injection 80 mg (has no administration in time range)  aspirin EC tablet 81 mg (has no administration in time range)  carvedilol (COREG) tablet 6.25 mg (has no administration in time range)  losartan (COZAAR) tablet 12.5 mg (has no administration in time range)  potassium chloride SA (KLOR-CON) CR tablet 20 mEq (has no administration in time range)  aspirin chewable tablet 324 mg (324 mg Oral Given 06/01/21 2104)  furosemide (LASIX) injection 80 mg (80 mg Intravenous Given 06/01/21 2104)  heparin bolus via infusion 4,000 Units (4,000 Units Intravenous Bolus from Bag 06/01/21 2158)  furosemide (LASIX) injection 60 mg (60 mg Intravenous Given 06/01/21 2257)     ED Discharge Orders     None        Note:  This document was prepared using Dragon voice recognition software and may include unintentional dictation errors.    Gilles Chiquito, MD 06/02/21 Burna Mortimer

## 2021-06-01 NOTE — ED Triage Notes (Signed)
Pt states he is shob, has congestive heart failure. Pt has not been taking his lasix. Pt with 2+ pedal edema noted. Pt able to speak in full sentences, drinking fluids in triage.

## 2021-06-01 NOTE — ED Notes (Signed)
PT is on cardiac, bp and pulse ox monitor. Primary RN made aware of pt

## 2021-06-01 NOTE — ED Notes (Signed)
Patient given [place] sandwich without a bag of chips and ginger ale with ice per request.

## 2021-06-02 ENCOUNTER — Other Ambulatory Visit: Payer: Self-pay

## 2021-06-02 ENCOUNTER — Encounter: Payer: Self-pay | Admitting: Internal Medicine

## 2021-06-02 DIAGNOSIS — F419 Anxiety disorder, unspecified: Secondary | ICD-10-CM | POA: Diagnosis present

## 2021-06-02 DIAGNOSIS — E785 Hyperlipidemia, unspecified: Secondary | ICD-10-CM | POA: Diagnosis present

## 2021-06-02 DIAGNOSIS — I5021 Acute systolic (congestive) heart failure: Secondary | ICD-10-CM | POA: Diagnosis not present

## 2021-06-02 DIAGNOSIS — F1910 Other psychoactive substance abuse, uncomplicated: Secondary | ICD-10-CM | POA: Diagnosis not present

## 2021-06-02 DIAGNOSIS — N1831 Chronic kidney disease, stage 3a: Secondary | ICD-10-CM | POA: Diagnosis present

## 2021-06-02 DIAGNOSIS — I2582 Chronic total occlusion of coronary artery: Secondary | ICD-10-CM | POA: Diagnosis present

## 2021-06-02 DIAGNOSIS — F1290 Cannabis use, unspecified, uncomplicated: Secondary | ICD-10-CM | POA: Diagnosis present

## 2021-06-02 DIAGNOSIS — I2510 Atherosclerotic heart disease of native coronary artery without angina pectoris: Secondary | ICD-10-CM | POA: Diagnosis present

## 2021-06-02 DIAGNOSIS — I130 Hypertensive heart and chronic kidney disease with heart failure and stage 1 through stage 4 chronic kidney disease, or unspecified chronic kidney disease: Secondary | ICD-10-CM | POA: Diagnosis present

## 2021-06-02 DIAGNOSIS — I248 Other forms of acute ischemic heart disease: Secondary | ICD-10-CM | POA: Diagnosis present

## 2021-06-02 DIAGNOSIS — T501X6A Underdosing of loop [high-ceiling] diuretics, initial encounter: Secondary | ICD-10-CM | POA: Diagnosis present

## 2021-06-02 DIAGNOSIS — Z6836 Body mass index (BMI) 36.0-36.9, adult: Secondary | ICD-10-CM | POA: Diagnosis not present

## 2021-06-02 DIAGNOSIS — I5043 Acute on chronic combined systolic (congestive) and diastolic (congestive) heart failure: Secondary | ICD-10-CM | POA: Diagnosis present

## 2021-06-02 DIAGNOSIS — F1010 Alcohol abuse, uncomplicated: Secondary | ICD-10-CM | POA: Diagnosis present

## 2021-06-02 DIAGNOSIS — I214 Non-ST elevation (NSTEMI) myocardial infarction: Secondary | ICD-10-CM

## 2021-06-02 DIAGNOSIS — I255 Ischemic cardiomyopathy: Secondary | ICD-10-CM | POA: Diagnosis present

## 2021-06-02 DIAGNOSIS — F32A Depression, unspecified: Secondary | ICD-10-CM

## 2021-06-02 DIAGNOSIS — I428 Other cardiomyopathies: Secondary | ICD-10-CM | POA: Diagnosis present

## 2021-06-02 DIAGNOSIS — E876 Hypokalemia: Secondary | ICD-10-CM | POA: Diagnosis present

## 2021-06-02 DIAGNOSIS — E669 Obesity, unspecified: Secondary | ICD-10-CM | POA: Diagnosis present

## 2021-06-02 DIAGNOSIS — Z91120 Patient's intentional underdosing of medication regimen due to financial hardship: Secondary | ICD-10-CM | POA: Diagnosis not present

## 2021-06-02 DIAGNOSIS — F1410 Cocaine abuse, uncomplicated: Secondary | ICD-10-CM | POA: Diagnosis present

## 2021-06-02 DIAGNOSIS — Z5329 Procedure and treatment not carried out because of patient's decision for other reasons: Secondary | ICD-10-CM | POA: Diagnosis not present

## 2021-06-02 DIAGNOSIS — I472 Ventricular tachycardia: Secondary | ICD-10-CM | POA: Diagnosis not present

## 2021-06-02 DIAGNOSIS — D631 Anemia in chronic kidney disease: Secondary | ICD-10-CM | POA: Diagnosis present

## 2021-06-02 DIAGNOSIS — Z20822 Contact with and (suspected) exposure to covid-19: Secondary | ICD-10-CM | POA: Diagnosis present

## 2021-06-02 DIAGNOSIS — Z9114 Patient's other noncompliance with medication regimen: Secondary | ICD-10-CM

## 2021-06-02 DIAGNOSIS — Z8249 Family history of ischemic heart disease and other diseases of the circulatory system: Secondary | ICD-10-CM | POA: Diagnosis not present

## 2021-06-02 LAB — BASIC METABOLIC PANEL
Anion gap: 9 (ref 5–15)
BUN: 29 mg/dL — ABNORMAL HIGH (ref 6–20)
CO2: 25 mmol/L (ref 22–32)
Calcium: 9 mg/dL (ref 8.9–10.3)
Chloride: 104 mmol/L (ref 98–111)
Creatinine, Ser: 1.41 mg/dL — ABNORMAL HIGH (ref 0.61–1.24)
GFR, Estimated: 58 mL/min — ABNORMAL LOW (ref >60)
Glucose, Bld: 130 mg/dL — ABNORMAL HIGH (ref 70–99)
Potassium: 3.8 mmol/L (ref 3.5–5.1)
Sodium: 138 mmol/L (ref 135–145)

## 2021-06-02 LAB — CBC
HCT: 38.5 % — ABNORMAL LOW (ref 39.0–52.0)
Hemoglobin: 13 g/dL (ref 13.0–17.0)
MCH: 29 pg (ref 26.0–34.0)
MCHC: 33.8 g/dL (ref 30.0–36.0)
MCV: 85.7 fL (ref 80.0–100.0)
Platelets: 269 10*3/uL (ref 150–400)
RBC: 4.49 MIL/uL (ref 4.22–5.81)
RDW: 16.2 % — ABNORMAL HIGH (ref 11.5–15.5)
WBC: 6.3 10*3/uL (ref 4.0–10.5)
nRBC: 0 % (ref 0.0–0.2)

## 2021-06-02 LAB — HEPARIN LEVEL (UNFRACTIONATED)
Heparin Unfractionated: <0.1 IU/mL — ABNORMAL LOW (ref 0.30–0.70)
Heparin Unfractionated: 0.13 IU/mL — ABNORMAL LOW (ref 0.30–0.70)
Heparin Unfractionated: 0.4 IU/mL (ref 0.30–0.70)

## 2021-06-02 MED ORDER — HEPARIN BOLUS VIA INFUSION
3000.0000 [IU] | Freq: Once | INTRAVENOUS | Status: AC
  Administered 2021-06-02: 3000 [IU] via INTRAVENOUS
  Filled 2021-06-02: qty 3000

## 2021-06-02 MED ORDER — FUROSEMIDE 10 MG/ML IJ SOLN
120.0000 mg | Freq: Two times a day (BID) | INTRAVENOUS | Status: DC
  Administered 2021-06-02 – 2021-06-03 (×2): 120 mg via INTRAVENOUS
  Filled 2021-06-02 (×4): qty 12

## 2021-06-02 MED ORDER — NITROGLYCERIN 2 % TD OINT
1.0000 [in_us] | TOPICAL_OINTMENT | Freq: Four times a day (QID) | TRANSDERMAL | Status: DC
  Administered 2021-06-02: 1 [in_us] via TOPICAL
  Filled 2021-06-02: qty 1

## 2021-06-02 MED ORDER — FUROSEMIDE 10 MG/ML IJ SOLN
40.0000 mg | Freq: Once | INTRAMUSCULAR | Status: AC
  Administered 2021-06-02: 40 mg via INTRAVENOUS
  Filled 2021-06-02: qty 4

## 2021-06-02 MED ORDER — NITROGLYCERIN IN D5W 200-5 MCG/ML-% IV SOLN: 0.0000 ug/min | INTRAVENOUS | Status: DC

## 2021-06-02 NOTE — ED Notes (Signed)
[facility] RN aware of assigned bed 

## 2021-06-02 NOTE — ED Notes (Signed)
Patient siting on side of bed. Allowed me to place him back on the monitor. Blood work drawn. Denies any needs at this time

## 2021-06-02 NOTE — ED Notes (Addendum)
Extensive time spent with pt. By this RN explaining use and necessity of nitro drip that Dr. Para March ordered. This RN further explained the need for BP and cardiac monitoring while on nitro drip. Pt. Is refusing nitro drip and any sort of monitoring. Pt. Continues to verbalize threats to leave, and is calling multiple family members to try and find someone to pick him up. Pt. Encouraged to stay and allow staff to treat him. Updated Dr. Para March, awaiting orders.

## 2021-06-02 NOTE — Progress Notes (Signed)
Warm blanket and ginger ale provided to patient.

## 2021-06-02 NOTE — Consult Note (Signed)
Cardiology Consult    Patient ID: Makel Mcmann MRN: [id number], DOB/AGE: 02/25/1961   Admit date: 06/01/2021 Date of Consult: 06/02/2021  Primary Physician: Vesta Mixer, MD Primary Cardiologist: Kristeen Miss, MD Requesting Provider: Kathie Rhodes. Nelson Chimes, MD  Patient Profile    Taelon Bendorf is a 60 y.o. male with a history of CAD, chronic combined syst/diast CHF, mixed ICM/NICM, HTN, HL, cocaine/etoh abuse, obesity, CKD II, anemia, and noncompliance, who is being seen today for the evaluation of recurrent CHF at the request of Dr. Nelson Chimes.  Past Medical History   Past Medical History:  Diagnosis Date   Alcohol abuse    CAD (coronary artery disease)    a. 05/2019 Cath: LM nl, LAD 15p, 77m, LCX large/nl, OM2 30, OM3 20, RCA 100 CTO w/ bridging L->R collats to RPDA.   Chronic combined systolic and diastolic CHF (congestive heart failure) (HCC)    a. 09/27/18 Echo: EF 20-25%, grade 2 DD; b. 03/2019 Echo: EF 20-25%; c. 05/2021 Echo: EF <10%, glob HK. GrII DD. Sev red RV fxn. Sev BAE. Mild MR. Mild-mod TR.   CKD (chronic kidney disease), stage II    Cocaine abuse (HCC)    Hyperlipidemia LDL goal <70    Hypertension    Ischemic cardiomyopathy    a. 09/27/18 Echo: EF 20-25%, grade 2 DD; b. 03/2019 Echo: EF 20-25%; c. 05/2019 Cath: RCA 100 CTA, otw nonobs dzs; d. 05/2021 Echo: EF <10%, glob HK. GrII DD. Sev red RV fxn.   Morbid obesity (HCC)    Noncompliance    Normocytic anemia     Past Surgical History:  Procedure Laterality Date   RIGHT/LEFT HEART CATH AND CORONARY ANGIOGRAPHY N/A 05/10/2019   Procedure: RIGHT/LEFT HEART CATH AND CORONARY ANGIOGRAPHY;  Surgeon: Yvonne Kendall, MD;  Location: [facility];  Service: Cardiovascular;  Laterality: N/A;     Allergies  Allergies  Allergen Reactions   Hydrocodone Itching    History of Present Illness    60 y/o ? with a history of CAD, chronic combined syst/diast CHF, mixed ICM/NICM, HTN, HL, cocaine/etoh abuse, obesity, CKD  II, anemia, and noncompliance.  Cardiac hx dates back to 09/2018, when he was admitted w/ CHF and found to have an EF of 20-25%.  He initially refused further w/u and left AMA.  Repeat echo in 03/2019 showed persistent LV dysfunction with an EF of 20 to 25%.  He was agreeable to undergo diagnostic catheterization in July 2020 showing severe one-vessel CAD with a chronic total occlusion of the proximal RCA and mild to moderate nonobstructive disease involving the LAD and left circumflex.  He has had multiple admissions in the setting of medication nonadherence and ongoing cocaine use.  He was admitted twice in February 2022 in the setting of worsening dyspnea and noncompliance.  Echocardiogram at that time showed an EF of less than 20%.    More recently, he was admitted to [facility] in mid July with recurrent volume overload.  During admission, troponin was mildly elevated with a flat trend (peak 40).  He was seen by our team and an echocardiogram showed an EF of less than 10% with global hypokinesis, grade 2 diastolic dysfunction, severely reduced RV function, severe biatrial enlargement, and mild to moderate TR.  Heart failure medications were resumed and on July 14, patient requested discharge.  He was felt to still be volume overloaded at that time.  Following discharge, he never picked up his discharge medications and over the past 2  weeks has had progressive increase in lower extremity swelling, dyspnea, and orthopnea.  He says that he came to [facility] on July 29 to visit his daughter and because of dyspnea, she advised him to present to the emergency department.  Here, he was noted to be markedly volume overloaded.  BNP was elevated at 4001.  Chest x-ray notable for cardiomegaly and vascular congestion.  High-sensitivity troponin elevated at 1279  1127.  He was placed on intravenous Lasix and heparin.  Continues complain of orthopnea but feels that he is responded well to Lasix thus far.  He last used  cocaine about 2 weeks ago, shortly after discharge from [facility].  He continues to smoke marijuana regularly.  Weight is listed as up 10 pounds since his discharge.  Inpatient Medications     aspirin EC  81 mg Oral Daily   atorvastatin  40 mg Oral Daily   carvedilol  6.25 mg Oral BID WC   losartan  12.5 mg Oral Daily   potassium chloride SA  20 mEq Oral Daily    Family History    Family History  Problem Relation Age of Onset   Hypertension Maternal Grandmother    He indicated that his mother is alive. He indicated that his father is alive. He indicated that the status of his maternal grandmother is unknown.   Social History    Social History   Socioeconomic History   Marital status: Single    Spouse name: none   Number of children: Not on file   Years of education: Not on file   Highest education level: Not on file  Occupational History   Occupation: Disabled  Tobacco Use   Smoking status: Never   Smokeless tobacco: Never  Vaping Use   Vaping Use: Never used  Substance and Sexual Activity   Alcohol use: Yes    Comment: 2 quarts a week    Drug use: Yes    Frequency: 3.0 times per week    Types: Marijuana, Cocaine   Sexual activity: Not Currently  Other Topics Concern   Not on file  Social History Narrative   Lives w/ mother in [place] but recently visiting w/ dtr in [place].   Social Determinants of Health   Financial Resource Strain: Medium Risk   Difficulty of Paying Living Expenses: Somewhat hard  Food Insecurity: Food Insecurity Present   Worried About Programme researcher, broadcasting/film/video in the Last Year: Sometimes true   Barista in the Last Year: Sometimes true  Transportation Needs: Unmet Transportation Needs   Lack of Transportation (Medical): Yes   Lack of Transportation (Non-Medical): Yes  Physical Activity: Not on file  Stress: Not on file  Social Connections: Not on file  Intimate Partner Violence: Not on file     Review of Systems    General:  No chills,  fever, night sweats.  +++ weight gain.    Cardiovascular: No chest pain, +++ dyspnea on exertion, +++ bilateral lower extremity edema, +++ orthopnea, no palpitations, paroxysmal nocturnal dyspnea. Dermatological: No rash, lesions/masses Respiratory: No cough, +++ dyspnea Urologic: No hematuria, dysuria Abdominal:   No nausea, vomiting, diarrhea, bright red blood per rectum, melena, or hematemesis Neurologic:  No visual changes, wkns, changes in mental status. All other systems reviewed and are otherwise negative except as noted above.  Physical Exam    Blood pressure 109/78, pulse 79, temperature 97.9 F (36.6 C), temperature source Oral, resp. rate 13, height 5\' 11"  (1.803 m), weight 122.5 kg,  SpO2 99 %.  General: Pleasant, NAD Psych: Normal affect. Neuro: Alert and oriented X 3. Moves all extremities spontaneously. HEENT: Normal  Neck: Supple without bruits.  JVD to jaw. Lungs:  Resp regular and unlabored, clear to auscultation. Heart: RRR + S4.  No murmurs. Abdomen: Obese, soft, non-tender, non-distended, BS + x 4.  Extremities: No clubbing, cyanosis.  3+ bilateral lower extremity edema to the knees.  DP/PT1+, Radials 2+ and equal bilaterally.  Labs    Cardiac Enzymes Recent Labs  Lab 05/11/21 0825 05/11/21 1205 06/01/21 2000 06/01/21 2153  TROPONINIHS 40* 36* 1,279* 1,127*      Lab Results  Component Value Date   WBC 6.3 06/02/2021   HGB 13.0 06/02/2021   HCT 38.5 (L) 06/02/2021   MCV 85.7 06/02/2021   PLT 269 06/02/2021    Recent Labs  Lab 06/01/21 2000 06/02/21 0529  NA 143 138  K 3.6 3.8  CL 106 104  CO2 28 25  BUN 27* 29*  CREATININE 1.40* 1.41*  CALCIUM 9.1 9.0  PROT 7.4  --   BILITOT 1.8*  --   ALKPHOS 92  --   ALT 30  --   AST 30  --   GLUCOSE 97 130*   Lab Results  Component Value Date   CHOL 164 09/09/2020   HDL 45 09/09/2020   LDLCALC 111 (H) 09/09/2020   TRIG 38 09/09/2020   Lab Results  Component Value Date   DDIMER 0.70 (H)  08/24/2020     Radiology Studies    DG Chest 2 View  Result Date: 06/01/2021 CLINICAL DATA:  Shortness of breath EXAM: CHEST - 2 VIEW COMPARISON:  05/25/2021 FINDINGS: Cardiomegaly, vascular congestion. No overt edema. No confluent opacities or effusions. No acute bony abnormality. IMPRESSION: Cardiomegaly, vascular congestion. Electronically Signed   By: Charlett Nose M.D.   On: 06/01/2021 20:07   ECG & Cardiac Imaging    Regular sinus rhythm, 93, first-degree AV block, left axis deviation, left atrial enlargement, incomplete left bundle branch block, poor R wave progression, lateral T wave inversion- personally reviewed.  Assessment & Plan    1.  Acute on chronic combined systolic and diastolic congestive heart failure: Patient with a history of systolic dysfunction dating back to November 2019 with multiple admissions complicated by ongoing noncompliance and drug use.  He was just discharged from [facility] after a 2-day admission related to heart failure.  Echocardiogram at that time showed an EF of less than 10%.  Unfortunately, he requested discharge while still being volume overloaded and never picked up his home medications.  Over the past 2 weeks, he has had progressive dyspnea on exertion, lower extremity edema, and orthopnea, which prompted presentation to [facility] on July 29.  Here, chest x-ray shows vascular congestion with a BNP of 4000.  He is volume overloaded on exam.  He has received multiple doses of intravenous Lasix with good urine output.  Continue aggressive IV diuresis and resume beta-blocker and ARB therapy.  He will need TOC assistance to identify how he's going to get him meds.  This may be made more difficult by living w/ his mother in [place] but usually seeking care in [place].  2.  CAD/Demand Ischemia:  pt w/ a h/o CTO of the RCA by cath in 05/2019.  He had mild high-sensitivity troponin elevation during his hospitalization at [facility] recently and on presentation yesterday,  troponin was elevated at 1279.  He denies any chest pain.  Follow-up troponin was slightly  lower at 1127.  ECG is without acute ST or T changes.  Suspect this is demand ischemia in the setting of progressive heart failure.  Diurese and resume previously prescribed medications as outlined above.  Agree with heparin x48 hours.  As it has been 2 years since his last catheterization, we will consider relook cath once volume improved.  Continue aspirin, statin, and beta-blocker therapy.  3.  Essential hypertension: Blood pressure stable.  Follow with resumption of medications.  4.  Hyperlipidemia: LDL 111 last November.  Continue high potency statin therapy.  5.  Stage II chronic kidney disease: Creatinine stable at 1.41 this morning.  Follow with diuresis.  6.  Polysubstance abuse: He says he has not used cocaine in about 2 weeks.  He continues to smoke marijuana.  Complete cessation advised.  7.  Normocytic anemia: Stable.  Signed, Nicolasa Ducking, NP 06/02/2021, 11:55 AM  For questions or updates, please contact   Please consult [email or web address] for contact info under Cardiology/STEMI.

## 2021-06-02 NOTE — ED Notes (Signed)
Nitro paste ordered by admitting MD after making aware of inadequate diuresis and pt c/o SOB while sitting in bed.

## 2021-06-02 NOTE — ED Notes (Signed)
MD at bedside. 

## 2021-06-02 NOTE — Consult Note (Signed)
ANTICOAGULATION CONSULT NOTE  Pharmacy Consult for heparin Indication: chest pain/ACS  Allergies  Allergen Reactions   Hydrocodone Itching    Patient Measurements: Height: 5\' 11"  (180.3 cm) Weight: 122.5 kg (270 lb) IBW/kg (Calculated) : 75.3 Heparin Dosing Weight: 102.6 kg  Vital Signs: Temp: 97.9 F (36.6 C) (07/30 0330) Temp Source: Oral (07/30 0330) BP: 109/78 (07/30 1100) Pulse Rate: 79 (07/30 1105)  Labs: Recent Labs    06/01/21 2000 06/01/21 2056 06/01/21 2109 06/01/21 2153 06/02/21 0529 06/02/21 1009  HGB  --   --  12.7*  --  13.0  --   HCT  --   --  37.6*  --  38.5*  --   PLT  --   --  277  --  269  --   APTT  --  30  --   --   --   --   LABPROT  --  15.0  --   --   --   --   INR  --  1.2  --   --   --   --   HEPARINUNFRC  --   --   --   --  <0.10* 0.13*  CREATININE 1.40*  --   --   --  1.41*  --   TROPONINIHS 1,279*  --   --  1,127*  --   --      Estimated Creatinine Clearance: 77 mL/min (A) (by C-G formula based on SCr of 1.41 mg/dL (H)).   Medical History: Past Medical History:  Diagnosis Date   Alcohol abuse    CAD (coronary artery disease)    a. 05/2019 Cath: LM nl, LAD 15p, 51m, LCX large/nl, OM2 30, OM3 20, RCA 100 CTO w/ bridging L->R collats to RPDA.   Chronic combined systolic and diastolic CHF (congestive heart failure) (HCC)    a. 09/27/18 Echo: EF 20-25%, grade 2 DD; b. 03/2019 Echo: EF 20-25%; c. 05/2021 Echo: EF <10%, glob HK. GrII DD. Sev red RV fxn. Sev BAE. Mild MR. Mild-mod TR.   Cocaine abuse (HCC)    Hyperlipidemia LDL goal <70    Hypertension    Ischemic cardiomyopathy    a. 09/27/18 Echo: EF 20-25%, grade 2 DD; b. 03/2019 Echo: EF 20-25%; c. 05/2019 Cath: RCA 100 CTA, otw nonobs dzs; d. 05/2021 Echo: EF <10%, glob HK. GrII DD. Sev red RV fxn.   Morbid obesity (HCC)    Noncompliance     Medications:  No PTA anticoagulation only on ASA 81mg  Heparin Dosing Weight: 102.6 kg  Assessment: 60 y.o. male with history of CHF, HTN,  and HLD who presented to the ED with worsening shortness of breath, orthopnea, and swelling in his legs. Pharmacy has been consulted for heparin dosing for ACS.  Heparin Dosing Weight: 102.6 kg Hgb: 12.7>13; Plts:277>269  Goal of Therapy:  Heparin level 0.3-0.7 units/ml Monitor platelets by anticoagulation protocol: Yes  Date Time aPTT/HL Rate/Comment 07/30  0529  <0.10  subtherapeutic (drip paused) 07/30  1009 0.13  subtherapeutic  1300 > 1650 un/hr     Plan:  **7/30 Received call from RN.  RN found heparin infusion paused at ~ 0300.  Pt keeps bending arm and IV line, triggering alarm.  Instead of calling RN, pt continue to pause drip to stop alarm.  RN now frequently checking and making sure heparin drip is still running.    HL subtherapeutic 0.13 Bolus 3000 units x1; then increase rate of infusion to  1650 un/hr (inc  of  ~3.4 un/k/h) Check HL in 6 hours after rate change and at least daily once consecutively therapeutic. CTM daily CBC w/ AM labs  Martyn Malay, RPH,BCCP 06/02/2021 11:47 AM

## 2021-06-02 NOTE — ED Notes (Addendum)
Patient requesting to speak to MD right now. Patient wants to know city bus schedule. Advised patient that if he leaves he will get sicker at home. Advised patient that we are doing the same orders down here in the ED that they would on the floor. Patient asks "what is your problem coming in here with negative vibes" Told patient that I wanted to help him but he is making my job hard" Patient laughed and repeated "oh I am making your job hard, try waiting 10 plus hours for help" MD paged for assistance with patient. MD states she will come see patients after she sees her other sick patients.

## 2021-06-02 NOTE — Consult Note (Signed)
ANTICOAGULATION CONSULT NOTE  Pharmacy Consult for heparin Indication: chest pain/ACS  Allergies  Allergen Reactions   Hydrocodone Itching    Patient Measurements: Height: 5\' 11"  (180.3 cm) Weight: 122.5 kg (270 lb) IBW/kg (Calculated) : 75.3 Heparin Dosing Weight: 102.6 kg  Vital Signs: Temp: 98.3 F (36.8 C) (07/30 1600) BP: 113/88 (07/30 1600) Pulse Rate: 86 (07/30 1600)  Labs: Recent Labs    06/01/21 2000 06/01/21 2056 06/01/21 2109 06/01/21 2153 06/02/21 0529 06/02/21 1009 06/02/21 1821  HGB  --   --  12.7*  --  13.0  --   --   HCT  --   --  37.6*  --  38.5*  --   --   PLT  --   --  277  --  269  --   --   APTT  --  30  --   --   --   --   --   LABPROT  --  15.0  --   --   --   --   --   INR  --  1.2  --   --   --   --   --   HEPARINUNFRC  --   --   --   --  <0.10* 0.13* 0.40  CREATININE 1.40*  --   --   --  1.41*  --   --   TROPONINIHS 1,279*  --   --  1,127*  --   --   --      Estimated Creatinine Clearance: 77 mL/min (A) (by C-G formula based on SCr of 1.41 mg/dL (H)).   Medical History: Past Medical History:  Diagnosis Date   Alcohol abuse    CAD (coronary artery disease)    a. 05/2019 Cath: LM nl, LAD 15p, 60m, LCX large/nl, OM2 30, OM3 20, RCA 100 CTO w/ bridging L->R collats to RPDA.   Chronic combined systolic and diastolic CHF (congestive heart failure) (HCC)    a. 09/27/18 Echo: EF 20-25%, grade 2 DD; b. 03/2019 Echo: EF 20-25%; c. 05/2021 Echo: EF <10%, glob HK. GrII DD. Sev red RV fxn. Sev BAE. Mild MR. Mild-mod TR.   CKD (chronic kidney disease), stage II    Cocaine abuse (HCC)    Hyperlipidemia LDL goal <70    Hypertension    Ischemic cardiomyopathy    a. 09/27/18 Echo: EF 20-25%, grade 2 DD; b. 03/2019 Echo: EF 20-25%; c. 05/2019 Cath: RCA 100 CTA, otw nonobs dzs; d. 05/2021 Echo: EF <10%, glob HK. GrII DD. Sev red RV fxn.   Morbid obesity (HCC)    Noncompliance    Normocytic anemia     Medications:  No PTA anticoagulation only on ASA  81mg  Heparin Dosing Weight: 102.6 kg  Assessment: 60 y.o. male with history of CHF, HTN, and HLD who presented to the ED with worsening shortness of breath, orthopnea, and swelling in his legs. Pharmacy has been consulted for heparin dosing for ACS.  Heparin Dosing Weight: 102.6 kg Hgb: 12.7>13; Plts:277>269  Goal of Therapy:  Heparin level 0.3-0.7 units/ml Monitor platelets by anticoagulation protocol: Yes  Date Time aPTT/HL Rate/Comment 07/30  0529  <0.10  subtherapeutic (drip paused) 07/30  1009 0.13  subtherapeutic , 1300 > 1650 un/hr 07/30 1821 0.40  Therapeutic, 1650 un/hr   Plan:  Heparin level 0.40, therapeutic Continue heparin infusion at 1650 units/hr  Check confirmatory HL in 6 hours and at least daily while on heparin Monitor daily CBC, s/s of  bleed  Derrek Gu, [facility] 06/02/2021 6:53 PM

## 2021-06-02 NOTE — ED Notes (Signed)
Attempted to transport pt to CPOD however charge RN Aundra Millet stated to keep pt in rm 8. Pt verbally angry that is not moving to floor. Refusing to talk to this RN. Refusing MD. Donnella Sham wanting to leave. Requesting to speak to admitting MD. Pt agreeable to stay when discussed plan of care with pt and bed availability. Pt still refusing to participate in care with this RN.

## 2021-06-02 NOTE — Progress Notes (Signed)
Report given to Toniann Ket Rn at bedside and patient transported to room.

## 2021-06-02 NOTE — Progress Notes (Signed)
PROGRESS NOTE    Dale [name]  BMW:[id number] DOB: 02/02/1961 DOA: 06/01/2021 PCP: Thayer Headings, MD   Brief Narrative: Taken from H&P.  Dale [name] is a 60 y.o. male with medical history significant for hyperlipidemia, hypertension, neuropathy, severe heart failure, history of cocaine use, CKD 3A, presents emergency department for chief concerns of shortness of breath.   He states that he has not been able to take his medications for about 1 month.  He states that the medication are too expensive for him.  He denies any fever, nausea, vomiting, chest pain, abdominal pain, dysuria, hematuria, diarrhea.  Labs in the emergency department was remarkable for sodium 143, potassium 3.6, chloride 106, bicarb 28, BUN 27, serum creatinine 1.40, nonfasting blood glucose 97, EGFR 59, troponin 1279, EKG concerning for some ischemic changes, he was started on heparin infusion.  Multiple recent hospitalizations for similar reasons and being noncompliant.  Patient continued to use drugs, last cocaine use was 2 weeks ago, regularly use marijuana.  History of leaving AMA couple of times, recently admitted at [facility] and discharged in mid July, advised to pick up medications which he never did.  Recent echocardiogram with EF of less than 10%, not a candidate for advanced therapies per cardiology due to being noncompliant.  They do not think that recath and PCI will help as he will not take his antiplatelets regularly.  He was started on aggressive diuresis. Will remain high risk for readmission, deterioration and death.  Subjective: Patient was complaining of shortness of breath, stating that I cannot lay down.  When asked about taking medication he said I cannot afford it. Some nursing concern of being very rude with them and having unrealistic demands.  Assessment & Plan:   Principal Problem:   Acute systolic heart failure (HCC) Active Problems:   Essential hypertension   Obesity  (BMI 30-39.9)   Substance abuse ([place])   Anxiety and depression   Acute CHF (congestive heart failure) (HCC)   Acute on chronic combined systolic (congestive) and diastolic (congestive) heart failure (HCC)   CKD (chronic kidney disease), stage III (HCC)  Acute on chronic combined heart failure.  Patient with extremely low EF of less than 10% on echocardiogram done recently.  History of ischemic cardiomyopathy and being noncompliant.  Multiple recent admissions and leaving AMA. Cardiology was consulted again. -Patient will be aggressively diuresed with 3 times daily Lasix 120 mg. -There is started his carvedilol and also placed him on losartan -Strict intake and output -Daily weight and BMP -Heart healthy diet with fluid restriction of 1500 mL  Elevated troponin.  Patient did not had any chest pain but do have some ischemic changes on EKG.  Most likely secondary to heart strain with severe heart failure exacerbation and being noncompliant.  Cardiology is recommending continuation of heparin infusion for 48 hours. Patient is not a good candidate for cardiac catheterization as if needing PCI he will not be able to take antiplatelets regularly. -Currently troponin decreasing. -Continue to monitor  Hyperlipidemia. -Home dose of atorvastatin was resumed  Hypertension. -Continue home losartan.  Objective: Vitals:   06/02/21 1315 06/02/21 1320 06/02/21 1344 06/02/21 1441  BP:  91/62 119/79 107/83  Pulse: 85 87 91 81  Resp:   (!) 22 18  Temp:   98.2 F (36.8 C)   TempSrc:      SpO2: 100% 93% 100% 96%  Weight:      Height:        Intake/Output Summary (  Last 24 hours) at 06/02/2021 1459 Last data filed at 06/02/2021 1244 Gross per 24 hour  Intake 240 ml  Output 800 ml  Net -560 ml   Filed Weights   06/01/21 1944  Weight: 122.5 kg    Examination:  General exam: Well-developed gentleman, appears calm and comfortable  Respiratory system: Few basal crackles. Respiratory effort  normal. Cardiovascular system: S1 & S2 heard, RRR.  Gastrointestinal system: Soft, nontender, nondistended, bowel sounds positive. Central nervous system: Alert and oriented. No focal neurological deficits. Extremities: 2+ LE edema, no cyanosis, pulses intact and symmetrical. Psychiatry: Judgement and insight appear normal. Mood & affect appropriate.    DVT prophylaxis: Heparin Code Status: Full Family Communication: Discussed with patient Disposition Plan:  Status is: Inpatient  Remains inpatient appropriate because:Inpatient level of care appropriate due to severity of illness  Dispo: The patient is from: Home              Anticipated d/c is to: Home              Patient currently is not medically stable to d/c.   Difficult to place patient No              Level of care: Progressive Cardiac  All the records are reviewed and case discussed with Care Management/Social Worker. Management plans discussed with the patient, nursing and they are in agreement.  Consultants:  Cardiology  Procedures:  Antimicrobials:   Data Reviewed: I have personally reviewed following labs and imaging studies  CBC: Recent Labs  Lab 06/01/21 2109 06/02/21 0529  WBC 6.0 6.3  NEUTROABS 4.3  --   HGB 12.7* 13.0  HCT 37.6* 38.5*  MCV 86.8 85.7  PLT 277 287   Basic Metabolic Panel: Recent Labs  Lab 06/01/21 2000 06/02/21 0529  NA 143 138  K 3.6 3.8  CL 106 104  CO2 28 25  GLUCOSE 97 130*  BUN 27* 29*  CREATININE 1.40* 1.41*  CALCIUM 9.1 9.0  MG 2.3  --    GFR: Estimated Creatinine Clearance: 77 mL/min (A) (by C-G formula based on SCr of 1.41 mg/dL (H)). Liver Function Tests: Recent Labs  Lab 06/01/21 2000  AST 30  ALT 30  ALKPHOS 92  BILITOT 1.8*  PROT 7.4  ALBUMIN 3.7   No results for input(s): LIPASE, AMYLASE in the last 168 hours. No results for input(s): AMMONIA in the last 168 hours. Coagulation Profile: Recent Labs  Lab 06/01/21 2056  INR 1.2   Cardiac  Enzymes: No results for input(s): CKTOTAL, CKMB, CKMBINDEX, TROPONINI in the last 168 hours. BNP (last 3 results) No results for input(s): PROBNP in the last 8760 hours. HbA1C: No results for input(s): HGBA1C in the last 72 hours. CBG: No results for input(s): GLUCAP in the last 168 hours. Lipid Profile: No results for input(s): CHOL, HDL, LDLCALC, TRIG, CHOLHDL, LDLDIRECT in the last 72 hours. Thyroid Function Tests: No results for input(s): TSH, T4TOTAL, FREET4, T3FREE, THYROIDAB in the last 72 hours. Anemia Panel: No results for input(s): VITAMINB12, FOLATE, FERRITIN, TIBC, IRON, RETICCTPCT in the last 72 hours. Sepsis Labs: No results for input(s): PROCALCITON, LATICACIDVEN in the last 168 hours.  Recent Results (from the past 240 hour(s))  Resp Panel by RT-PCR (Flu A&B, Covid) Nasopharyngeal Swab     Status: None   Collection Time: 06/01/21  8:52 PM   Specimen: Nasopharyngeal Swab; Nasopharyngeal(NP) swabs in vial transport medium  Result Value Ref Range Status   SARS Coronavirus 2 by  RT PCR NEGATIVE NEGATIVE Final    Comment: (NOTE) SARS-CoV-2 target nucleic acids are NOT DETECTED.  The SARS-CoV-2 RNA is generally detectable in upper respiratory specimens during the acute phase of infection. The lowest concentration of SARS-CoV-2 viral copies this assay can detect is 138 copies/mL. A negative result does not preclude SARS-Cov-2 infection and should not be used as the sole basis for treatment or other patient management decisions. A negative result may occur with  improper specimen collection/handling, submission of specimen other than nasopharyngeal swab, presence of viral mutation(s) within the areas targeted by this assay, and inadequate number of viral copies(<138 copies/mL). A negative result must be combined with clinical observations, patient history, and epidemiological information. The expected result is Negative.  Fact Sheet for Patients:   EntrepreneurPulse.com.au  Fact Sheet for Healthcare Providers:  IncredibleEmployment.be  This test is no t yet approved or cleared by the [place] FDA and  has been authorized for detection and/or diagnosis of SARS-CoV-2 by FDA under an Emergency Use Authorization (EUA). This EUA will remain  in effect (meaning this test can be used) for the duration of the COVID-19 declaration under Section 564(b)(1) of the Act, 21 U.S.C.section 360bbb-3(b)(1), unless the authorization is terminated  or revoked sooner.       Influenza A by PCR NEGATIVE NEGATIVE Final   Influenza B by PCR NEGATIVE NEGATIVE Final    Comment: (NOTE) The Xpert Xpress SARS-CoV-2/FLU/RSV plus assay is intended as an aid in the diagnosis of influenza from Nasopharyngeal swab specimens and should not be used as a sole basis for treatment. Nasal washings and aspirates are unacceptable for Xpert Xpress SARS-CoV-2/FLU/RSV testing.  Fact Sheet for Patients: EntrepreneurPulse.com.au  Fact Sheet for Healthcare Providers: IncredibleEmployment.be  This test is not yet approved or cleared by the [place] FDA and has been authorized for detection and/or diagnosis of SARS-CoV-2 by FDA under an Emergency Use Authorization (EUA). This EUA will remain in effect (meaning this test can be used) for the duration of the COVID-19 declaration under Section 564(b)(1) of the Act, 21 U.S.C. section 360bbb-3(b)(1), unless the authorization is terminated or revoked.  Performed at [facility], [address], [place], [place] [address]      Radiology Studies: DG Chest 2 View  Result Date: 06/01/2021 CLINICAL DATA:  Shortness of breath EXAM: CHEST - 2 VIEW COMPARISON:  05/25/2021 FINDINGS: Cardiomegaly, vascular congestion. No overt edema. No confluent opacities or effusions. No acute bony abnormality. IMPRESSION: Cardiomegaly, vascular  congestion. Electronically Signed   By: Rolm Baptise M.D.   On: 06/01/2021 20:07    Scheduled Meds:  aspirin EC  81 mg Oral Daily   atorvastatin  40 mg Oral Daily   carvedilol  6.25 mg Oral BID WC   losartan  12.5 mg Oral Daily   potassium chloride SA  20 mEq Oral Daily   Continuous Infusions:  furosemide     heparin 1,650 Units/hr (06/02/21 1328)   nitroGLYCERIN Stopped (06/02/21 0603)     LOS: 0 days   Time spent: 45 minutes. More than 50% of the time was spent in counseling/coordination of care  Lorella Nimrod, MD [facility]  If 7PM-7AM, please contact night-coverage [email or web address]  06/02/2021, 2:59 PM   This record has been created using Systems analyst. Errors have been sought and corrected,but may not always be located. Such creation errors do not reflect on the standard of care.

## 2021-06-02 NOTE — Consult Note (Signed)
ANTICOAGULATION CONSULT NOTE  Pharmacy Consult for heparin Indication: chest pain/ACS  Allergies  Allergen Reactions   Hydrocodone Itching    Patient Measurements: Height: 5\' 11"  (180.3 cm) Weight: 122.5 kg (270 lb) IBW/kg (Calculated) : 75.3 Heparin Dosing Weight: 102.6 kg  Vital Signs: Temp: 97.9 F (36.6 C) (07/30 0330) Temp Source: Oral (07/30 0330) BP: 130/84 (07/30 0630) Pulse Rate: 99 (07/30 0057)  Labs: Recent Labs    06/01/21 2000 06/01/21 2056 06/01/21 2109 06/01/21 2153 06/02/21 0529  HGB  --   --  12.7*  --  13.0  HCT  --   --  37.6*  --  38.5*  PLT  --   --  277  --  269  APTT  --  30  --   --   --   LABPROT  --  15.0  --   --   --   INR  --  1.2  --   --   --   HEPARINUNFRC  --   --   --   --  <0.10*  CREATININE 1.40*  --   --   --  1.41*  TROPONINIHS 1,279*  --   --  1,127*  --      Estimated Creatinine Clearance: 77 mL/min (A) (by C-G formula based on SCr of 1.41 mg/dL (H)).   Medical History: Past Medical History:  Diagnosis Date   Alcohol abuse    Chronic combined systolic and diastolic CHF (congestive heart failure) (HCC)    a. 09/27/18 showed mild LVH, EF 20-25%, grade 2 DD, mild MR, severely dilated LA, mildly dilated RV with mildly reduced RV function, mod RAE.   Cocaine abuse (HCC)    Hypertension     Medications:  No PTA anticoagulation  Assessment: 60 y.o. male with history of CHF, HTN, and HLD who presented to the ED with worsening shortness of breath, orthopnea, and swelling in his legs. Pharmacy has been consulted for heparin dosing for ACS. Heparin Dosing Weight: 102.6 kg Hgb: 12.7; Plts:277  Goal of Therapy:  Heparin level 0.3-0.7 units/ml Monitor platelets by anticoagulation protocol: Yes  07/30 0529 HL <0.10, subtherapeutic (drip paused)   Plan:  Received call from RN.  RN found heparin infusion paused at ~ 0300.  Pt keeps bending arm and IV line, triggering alarm.  Instead of calling RN, pt continue to pause drip  to stop alarm.  RN now frequently checking and making sure heparin drip is still running.  Will recheck HL for 0900, ~ 6 hours after RN found IV paused for unknown amount of time.8/30, PharmD, MBA 06/02/2021 7:03 AM

## 2021-06-02 NOTE — ED Notes (Signed)
Pt. Has nitro-past on, but is refusing to wear cardiac monitoring. This RN convinced pt. To allow BP's to be taken. Pt. Continues to threaten to leave because he is frustrated with episodes of not being able to breathe, and not having a hospital room. Pt. Remains sitting up on edge of bed or in chair because when he lays back he states he can't breathe.

## 2021-06-02 NOTE — ED Notes (Signed)
Dr. Duncan at bedside 

## 2021-06-03 ENCOUNTER — Encounter: Payer: Self-pay | Admitting: Internal Medicine

## 2021-06-03 DIAGNOSIS — I5021 Acute systolic (congestive) heart failure: Secondary | ICD-10-CM | POA: Diagnosis not present

## 2021-06-03 DIAGNOSIS — I255 Ischemic cardiomyopathy: Secondary | ICD-10-CM

## 2021-06-03 DIAGNOSIS — F1910 Other psychoactive substance abuse, uncomplicated: Secondary | ICD-10-CM | POA: Diagnosis not present

## 2021-06-03 DIAGNOSIS — I5043 Acute on chronic combined systolic (congestive) and diastolic (congestive) heart failure: Secondary | ICD-10-CM

## 2021-06-03 DIAGNOSIS — I248 Other forms of acute ischemic heart disease: Secondary | ICD-10-CM

## 2021-06-03 LAB — BASIC METABOLIC PANEL
Anion gap: 12 (ref 5–15)
BUN: 37 mg/dL — ABNORMAL HIGH (ref 6–20)
CO2: 26 mmol/L (ref 22–32)
Calcium: 9.3 mg/dL (ref 8.9–10.3)
Chloride: 101 mmol/L (ref 98–111)
Creatinine, Ser: 1.55 mg/dL — ABNORMAL HIGH (ref 0.61–1.24)
GFR, Estimated: 52 mL/min — ABNORMAL LOW (ref >60)
Glucose, Bld: 120 mg/dL — ABNORMAL HIGH (ref 70–99)
Potassium: 3.7 mmol/L (ref 3.5–5.1)
Sodium: 139 mmol/L (ref 135–145)

## 2021-06-03 LAB — CBC
HCT: 37.6 % — ABNORMAL LOW (ref 39.0–52.0)
Hemoglobin: 12.6 g/dL — ABNORMAL LOW (ref 13.0–17.0)
MCH: 28.4 pg (ref 26.0–34.0)
MCHC: 33.5 g/dL (ref 30.0–36.0)
MCV: 84.7 fL (ref 80.0–100.0)
Platelets: 279 10*3/uL (ref 150–400)
RBC: 4.44 MIL/uL (ref 4.22–5.81)
RDW: 16.1 % — ABNORMAL HIGH (ref 11.5–15.5)
WBC: 6.7 10*3/uL (ref 4.0–10.5)
nRBC: 0 % (ref 0.0–0.2)

## 2021-06-03 LAB — HEPARIN LEVEL (UNFRACTIONATED): Heparin Unfractionated: 0.43 IU/mL (ref 0.30–0.70)

## 2021-06-03 MED ORDER — FUROSEMIDE 10 MG/ML IJ SOLN
10.0000 mg/h | INTRAVENOUS | Status: DC
  Administered 2021-06-03: 10 mg/h via INTRAVENOUS
  Filled 2021-06-03 (×2): qty 20

## 2021-06-03 MED ORDER — FUROSEMIDE 10 MG/ML IJ SOLN: 80.0000 mg | Freq: Two times a day (BID) | INTRAMUSCULAR | Status: DC

## 2021-06-03 MED ORDER — ISOSORB DINITRATE-HYDRALAZINE 20-37.5 MG PO TABS
1.0000 | ORAL_TABLET | Freq: Three times a day (TID) | ORAL | Status: DC
  Administered 2021-06-04 – 2021-06-05 (×4): 1 via ORAL
  Filled 2021-06-03 (×6): qty 1

## 2021-06-03 NOTE — Progress Notes (Signed)
PROGRESS NOTE    Dale [name]  XID:[id number] DOB: 16-Feb-1961 DOA: 06/01/2021 PCP: Thayer Headings, MD   Brief Narrative: Taken from H&P.  Dale [name] is a 60 y.o. male with medical history significant for hyperlipidemia, hypertension, neuropathy, severe heart failure, history of cocaine use, CKD 3A, presents emergency department for chief concerns of shortness of breath.   He states that he has not been able to take his medications for about 1 month.  He states that the medication are too expensive for him.  He denies any fever, nausea, vomiting, chest pain, abdominal pain, dysuria, hematuria, diarrhea.  Labs in the emergency department was remarkable for sodium 143, potassium 3.6, chloride 106, bicarb 28, BUN 27, serum creatinine 1.40, nonfasting blood glucose 97, EGFR 59, troponin 1279, EKG concerning for some ischemic changes, he was started on heparin infusion.  Multiple recent hospitalizations for similar reasons and being noncompliant.  Patient continued to use drugs, last cocaine use was 2 weeks ago, regularly use marijuana.  History of leaving AMA couple of times, recently admitted at [facility] and discharged in mid July, advised to pick up medications which he never did.  Recent echocardiogram with EF of less than 10%, not a candidate for advanced therapies per cardiology due to being noncompliant.  They do not think that recath and PCI will help as he will not take his antiplatelets regularly.  He was started on aggressive diuresis. Will remain high risk for readmission, deterioration and death. Patient refusing to wear telemetry-follow agreed with cardiology. Cardiology started him on Lasix infusion. Palliative care was also consulted.  Subjective: Patient was resting comfortably when seen today.  Easily arousable, continues to have dyspnea. Denies any chest pain.  He refused to wear telemetry with me, later agreed with cardiology.  Assessment & Plan:    Principal Problem:   Acute systolic heart failure (HCC) Active Problems:   Essential hypertension   Obesity (BMI 30-39.9)   Substance abuse ([place])   Anxiety and depression   Acute CHF (congestive heart failure) (HCC)   Acute on chronic combined systolic (congestive) and diastolic (congestive) heart failure (HCC)   CKD (chronic kidney disease), stage III (HCC)  Acute on chronic combined heart failure.  Patient with extremely low EF of less than 10% on echocardiogram done recently.  History of ischemic cardiomyopathy and being noncompliant.  Multiple recent admissions and leaving AMA. Cardiology was consulted-not a good candidate for any advance therapies or interventions due to being noncompliant and other behavioral issues. -IV Lasix was switched with Lasix infusion today. -There is started his carvedilol and also placed him on losartan -Strict intake and output -Daily weight and BMP -Heart healthy diet with fluid restriction of 1500 mL  Elevated troponin.  Patient did not had any chest pain but do have some ischemic changes on EKG.  Most likely secondary to heart strain with severe heart failure exacerbation and being noncompliant.  Cardiology is recommending continuation of heparin infusion for 48 hours. Patient is not a good candidate for cardiac catheterization as if needing PCI he will not be able to take antiplatelets regularly. -Continue with heparin infusion for another day -Currently troponin decreasing. -Continue to monitor  Hyperlipidemia. -Home dose of atorvastatin was resumed  Hypertension. -Continue home losartan.  Objective: Vitals:   06/02/21 1930 06/03/21 0452 06/03/21 0500 06/03/21 0755  BP: 110/78 124/85  (!) 122/98  Pulse: 74 98  93  Resp: _0 Temp: 98 F (36.7 C) 98.2 F (  36.8 C)  98 F (36.7 C)  TempSrc: Oral Oral  Oral  SpO2: 94% 93%  100%  Weight:   119.3 kg   Height:        Intake/Output Summary (Last 24 hours) at 06/03/2021 1438 Last  data filed at 06/03/2021 1000 Gross per 24 hour  Intake 1473.32 ml  Output 1275 ml  Net 198.32 ml    Filed Weights   06/01/21 1944 06/03/21 0500  Weight: 122.5 kg 119.3 kg    Examination:  General.  Developed gentleman, in no acute distress. Pulmonary.  Lungs clear bilaterally, normal respiratory effort. CV.  Regular rate and rhythm, no JVD, rub or murmur. Abdomen.  Soft, nontender, nondistended, BS positive. CNS.  Alert and oriented x3.  No focal neurologic deficit. Extremities.  2+ LE edema, no cyanosis, pulses intact and symmetrical. Psychiatry.  Judgment and insight appears normal.   DVT prophylaxis: Heparin Code Status: Full Family Communication: Discussed with patient Disposition Plan:  Status is: Inpatient  Remains inpatient appropriate because:Inpatient level of care appropriate due to severity of illness  Dispo: The patient is from: Home              Anticipated d/c is to: Home              Patient currently is not medically stable to d/c.   Difficult to place patient No              Level of care: Progressive Cardiac  All the records are reviewed and case discussed with Care Management/Social Worker. Management plans discussed with the patient, nursing and they are in agreement.  Consultants:  Cardiology  Procedures:  Antimicrobials:   Data Reviewed: I have personally reviewed following labs and imaging studies  CBC: Recent Labs  Lab 06/01/21 2109 06/02/21 0529 06/03/21 0440  WBC 6.0 6.3 6.7  NEUTROABS 4.3  --   --   HGB 12.7* 13.0 12.6*  HCT 37.6* 38.5* 37.6*  MCV 86.8 85.7 84.7  PLT 277 269 287    Basic Metabolic Panel: Recent Labs  Lab 06/01/21 2000 06/02/21 0529 06/03/21 0440  NA 143 138 139  K 3.6 3.8 3.7  CL 106 104 101  CO2 _0 GLUCOSE 97 130* 120*  BUN 27* 29* 37*  CREATININE 1.40* 1.41* 1.55*  CALCIUM 9.1 9.0 9.3  MG 2.3  --   --     GFR: Estimated Creatinine Clearance: 69.1 mL/min (A) (by C-G formula based on SCr  of 1.55 mg/dL (H)). Liver Function Tests: Recent Labs  Lab 06/01/21 2000  AST 30  ALT 30  ALKPHOS 92  BILITOT 1.8*  PROT 7.4  ALBUMIN 3.7    No results for input(s): LIPASE, AMYLASE in the last 168 hours. No results for input(s): AMMONIA in the last 168 hours. Coagulation Profile: Recent Labs  Lab 06/01/21 2056  INR 1.2    Cardiac Enzymes: No results for input(s): CKTOTAL, CKMB, CKMBINDEX, TROPONINI in the last 168 hours. BNP (last 3 results) No results for input(s): PROBNP in the last 8760 hours. HbA1C: No results for input(s): HGBA1C in the last 72 hours. CBG: No results for input(s): GLUCAP in the last 168 hours. Lipid Profile: No results for input(s): CHOL, HDL, LDLCALC, TRIG, CHOLHDL, LDLDIRECT in the last 72 hours. Thyroid Function Tests: No results for input(s): TSH, T4TOTAL, FREET4, T3FREE, THYROIDAB in the last 72 hours. Anemia Panel: No results for input(s): VITAMINB12, FOLATE, FERRITIN, TIBC, IRON, RETICCTPCT in the last 72 hours.  Sepsis Labs: No results for input(s): PROCALCITON, LATICACIDVEN in the last 168 hours.  Recent Results (from the past 240 hour(s))  Resp Panel by RT-PCR (Flu A&B, Covid) Nasopharyngeal Swab     Status: None   Collection Time: 06/01/21  8:52 PM   Specimen: Nasopharyngeal Swab; Nasopharyngeal(NP) swabs in vial transport medium  Result Value Ref Range Status   SARS Coronavirus 2 by RT PCR NEGATIVE NEGATIVE Final    Comment: (NOTE) SARS-CoV-2 target nucleic acids are NOT DETECTED.  The SARS-CoV-2 RNA is generally detectable in upper respiratory specimens during the acute phase of infection. The lowest concentration of SARS-CoV-2 viral copies this assay can detect is 138 copies/mL. A negative result does not preclude SARS-Cov-2 infection and should not be used as the sole basis for treatment or other patient management decisions. A negative result may occur with  improper specimen collection/handling, submission of specimen  other than nasopharyngeal swab, presence of viral mutation(s) within the areas targeted by this assay, and inadequate number of viral copies(<138 copies/mL). A negative result must be combined with clinical observations, patient history, and epidemiological information. The expected result is Negative.  Fact Sheet for Patients:  EntrepreneurPulse.com.au  Fact Sheet for Healthcare Providers:  IncredibleEmployment.be  This test is no t yet approved or cleared by the [place] FDA and  has been authorized for detection and/or diagnosis of SARS-CoV-2 by FDA under an Emergency Use Authorization (EUA). This EUA will remain  in effect (meaning this test can be used) for the duration of the COVID-19 declaration under Section 564(b)(1) of the Act, 21 U.S.C.section 360bbb-3(b)(1), unless the authorization is terminated  or revoked sooner.       Influenza A by PCR NEGATIVE NEGATIVE Final   Influenza B by PCR NEGATIVE NEGATIVE Final    Comment: (NOTE) The Xpert Xpress SARS-CoV-2/FLU/RSV plus assay is intended as an aid in the diagnosis of influenza from Nasopharyngeal swab specimens and should not be used as a sole basis for treatment. Nasal washings and aspirates are unacceptable for Xpert Xpress SARS-CoV-2/FLU/RSV testing.  Fact Sheet for Patients: EntrepreneurPulse.com.au  Fact Sheet for Healthcare Providers: IncredibleEmployment.be  This test is not yet approved or cleared by the [place] FDA and has been authorized for detection and/or diagnosis of SARS-CoV-2 by FDA under an Emergency Use Authorization (EUA). This EUA will remain in effect (meaning this test can be used) for the duration of the COVID-19 declaration under Section 564(b)(1) of the Act, 21 U.S.C. section 360bbb-3(b)(1), unless the authorization is terminated or revoked.  Performed at [facility], [address]  [place], [place] [address]       Radiology Studies: DG Chest 2 View  Result Date: 06/01/2021 CLINICAL DATA:  Shortness of breath EXAM: CHEST - 2 VIEW COMPARISON:  05/25/2021 FINDINGS: Cardiomegaly, vascular congestion. No overt edema. No confluent opacities or effusions. No acute bony abnormality. IMPRESSION: Cardiomegaly, vascular congestion. Electronically Signed   By: Rolm Baptise M.D.   On: 06/01/2021 20:07    Scheduled Meds:  aspirin EC  81 mg Oral Daily   atorvastatin  40 mg Oral Daily   carvedilol  6.25 mg Oral BID WC   [START ON 06/04/2021] isosorbide-hydrALAZINE  1 tablet Oral TID   potassium chloride SA  20 mEq Oral Daily   Continuous Infusions:  furosemide (LASIX) 200 mg in dextrose 5% 100 mL (80m/mL) infusion 10 mg/hr (06/03/21 1257)   heparin 1,650 Units/hr (06/03/21 0733)     LOS: 1 day   Time spent: 35  minutes. More than 50% of the time was spent in counseling/coordination of care  Lorella Nimrod, MD [facility]  If 7PM-7AM, please contact night-coverage [email or web address]  06/03/2021, 2:38 PM   This record has been created using Systems analyst. Errors have been sought and corrected,but may not always be located. Such creation errors do not reflect on the standard of care.

## 2021-06-03 NOTE — Progress Notes (Signed)
Pt on telemetry with Heparin gtt. Patient has refused to wear telemetry box frequently this shift. Alerting Dr. Para March to pt's noncompliance with treatment.  Patient informed me that he is not putting those "wires" on me. Pt refuses gown with telemetry box pocket. Education done at bedside with regards to telemetry box and Heparin gtt. Pt still refused to put tele box on. Also updated Dr. Para March his last ectopy was 1812 (day shift 7/30) in which he had 5 beats of VTACH. Eloise Levels MD. Will continue to monitor pt at bedside.

## 2021-06-03 NOTE — Progress Notes (Signed)
Progress Note  Patient Name: Dale [name] Date of Encounter: 06/03/2021  Primary Cardiologist: Kristeen Miss, MD  Subjective   Didn't sleep well.  Wants "sleeping pills" this AM.  Still dyspneic @ rest.  Refused to wear tele box overnight.  Inpatient Medications    Scheduled Meds:  aspirin EC  81 mg Oral Daily   atorvastatin  40 mg Oral Daily   carvedilol  6.25 mg Oral BID WC   furosemide  80 mg Intravenous BID   losartan  12.5 mg Oral Daily   potassium chloride SA  20 mEq Oral Daily   Continuous Infusions:  heparin 1,650 Units/hr (06/03/21 0733)   nitroGLYCERIN Stopped (06/02/21 0603)   PRN Meds: acetaminophen **OR** acetaminophen, ondansetron **OR** ondansetron (ZOFRAN) IV   Vital Signs    Vitals:   06/02/21 1930 06/03/21 0452 06/03/21 0500 06/03/21 0755  BP: 110/78 124/85  (!) 122/98  Pulse: 74 98  93  Resp: 18 14  18   Temp: 98 F (36.7 C) 98.2 F (36.8 C)  98 F (36.7 C)  TempSrc: Oral Oral  Oral  SpO2: 94% 93%  100%  Weight:   119.3 kg   Height:        Intake/Output Summary (Last 24 hours) at 06/03/2021 1118 Last data filed at 06/03/2021 1000 Gross per 24 hour  Intake 1473.32 ml  Output 1525 ml  Net -51.68 ml   Filed Weights   06/01/21 1944 06/03/21 0500  Weight: 122.5 kg 119.3 kg    Physical Exam   GEN: Obese, in no acute distress.  HEENT: Grossly normal.  Neck: Supple, JVD to jaw.  No carotid bruits, or masses. Cardiac: RRR, distant, no murmurs, rubs, or gallops. No clubbing, cyanosis.  3+ bilat lower ext edema to knees.  Radials 2+, DP/PT 1+ and equal bilaterally.  Respiratory:  Respirations regular and unlabored, clear to auscultation bilaterally. GI: Obese, soft, nontender, nondistended, BS + x 4. MS: no deformity or atrophy. Skin: warm and dry, no rash. Neuro:  Strength and sensation are intact. Psych: AAOx3.  Normal affect.  Labs    Chemistry Recent Labs  Lab 06/01/21 2000 06/02/21 0529 06/03/21 0440  NA 143 138 139  K  3.6 3.8 3.7  CL 106 104 101  CO2 28 25 26   GLUCOSE 97 130* 120*  BUN 27* 29* 37*  CREATININE 1.40* 1.41* 1.55*  CALCIUM 9.1 9.0 9.3  PROT 7.4  --   --   ALBUMIN 3.7  --   --   AST 30  --   --   ALT 30  --   --   ALKPHOS 92  --   --   BILITOT 1.8*  --   --   GFRNONAA 59* 58* 52*  ANIONGAP 9 9 12      Hematology Recent Labs  Lab 06/01/21 2109 06/02/21 0529 06/03/21 0440  WBC 6.0 6.3 6.7  RBC 4.33 4.49 4.44  HGB 12.7* 13.0 12.6*  HCT 37.6* 38.5* 37.6*  MCV 86.8 85.7 84.7  MCH 29.3 29.0 28.4  MCHC 33.8 33.8 33.5  RDW 16.4* 16.2* 16.1*  PLT 277 269 279    Cardiac Enzymes  Recent Labs  Lab 05/11/21 0825 05/11/21 1205 06/01/21 2000 06/01/21 2153  TROPONINIHS 40* 36* 1,279* 1,127*      BNP Recent Labs  Lab 06/01/21 2109  BNP 4,001.7*      Lipids  Lab Results  Component Value Date   CHOL 164 09/09/2020   HDL 45 09/09/2020  LDLCALC 111 (H) 09/09/2020   TRIG 38 09/09/2020   CHOLHDL 3.6 09/09/2020    HbA1c  Lab Results  Component Value Date   HGBA1C 6.1 (H) 05/11/2021    Radiology    DG Chest 2 View  Result Date: 06/01/2021 CLINICAL DATA:  Shortness of breath EXAM: CHEST - 2 VIEW COMPARISON:  05/25/2021 FINDINGS: Cardiomegaly, vascular congestion. No overt edema. No confluent opacities or effusions. No acute bony abnormality. IMPRESSION: Cardiomegaly, vascular congestion. Electronically Signed   By: Charlett Nose M.D.   On: 06/01/2021 20:07    Telemetry    RSR, 5 beats of NSVT yesterday - refused tele overnight - Personally Reviewed  Cardiac Studies    a. 05/2019 Cath: LM nl, LAD 15p, 41m, LCX large/nl, OM2 30, OM3 20, RCA 100 CTO w/ bridging L->R collats to RPDA.    b. 09/27/18 Echo: EF 20-25%, grade 2 DD c. 03/2019 Echo: EF 20-25%  d. 05/2021 Echo: EF <10%, glob HK. GrII DD. Sev red RV fxn. Sev BAE. Mild MR. Mild-mod TR.   Patient Profile     Dale [name] is a 60 y.o. male with a history of CAD, chronic combined syst/diast CHF, mixed  ICM/NICM, HTN, HL, cocaine/etoh abuse, obesity, CKD II, anemia, and noncompliance, who was admitted 7/29 secondary to recurrent CHF in the setting of ongoing noncompliance.  Assessment & Plan    1.  Acute on chronic combined systolic and diastolic CHF:  Pt w/ a h/o systolic dysfxn dating back to 09/2018 w/ multiple admissions complicated by ongoing noncompliance and drug use.   Recently discharged from [facility] after 2-day admission related to heart failure.  Echocardiogram at that time showed an EF of less than 10%.  Readmitted July 29 in setting of progressive dyspnea, edema, orthopnea, and continued noncompliance.  He received a total of 220 mg of IV Lasix yesterday.  I's and O's inaccurate.  Wt down 3.2 kg.  Still dyspneic and markedly volume overloaded.  His BUN/creatinine are elevated at 37/1.55 this morning.  Bicarb stable.  Review of historical labs shows that 1.55 is relatively within prior baseline.  He may need inotropic therapy but is likely a poor candidate for PICC line/milrinone due to unwillingness to be monitored and high risk of VT.  We will switch to Lasix infusion at 10 mg/h.  Cont ? blocker for now (would d/c if milrinone becomes necessary).  Out of concern for rising creat, will hold ARB for now and change to bidil, which he had been rx previously.  Poor candidate for SGLT2i w/ CKD.  2.  Coronary artery disease/demand ischemia: Patient with a history of chronic total occlusion of the RCA by cath in July 2020.  He had mild high sensitive troponin elevation during hospitalization at [facility] recently and on presentation July 29, troponin was elevated at 1279.  He has not had any chest pain.  Suspect demand ischemia in the setting progressive heart failure.  Likely a poor cath/PCI candidate in the setting of CKD and noncompliance.  Continue medical therapy including heparin for a total of 48 hours (complete 2200 tonight), asa, statin, ? blocker.  3.  NSVT:  5 beats on tele yesterday.   Asymptomatic.  Cont ? blocker. Discussed w/ pt - high risk for arrhythmia/SCD in setting of severely reduced EF.  Strongly encouraged him to remain on tele - he's currently agreeable to allow tele to be replaced.  4.  CKD II:  As above, creat up sl.  Follow w/ diuresis.  5.  Polysubstance abuse:  Says no cocaine in 2 wks.  UDS neg for cocaine but + for marijuana.  Cessation advised.  6.  Noncompliance: Will need significant social assistance prior to discharge.  Signed, Nicolasa Ducking, NP  06/03/2021, 11:18 AM    For questions or updates, please contact   Please consult [email or web address] for contact info under Cardiology/STEMI.

## 2021-06-03 NOTE — Consult Note (Signed)
ANTICOAGULATION CONSULT NOTE  Pharmacy Consult for heparin Indication: chest pain/ACS  Allergies  Allergen Reactions   Hydrocodone Itching    Patient Measurements: Height: 5\' 11"  (180.3 cm) Weight: 122.5 kg (270 lb) IBW/kg (Calculated) : 75.3 Heparin Dosing Weight: 102.6 kg  Vital Signs: Temp: 98.3 F (36.8 C) (07/30 1600) BP: 113/88 (07/30 1600) Pulse Rate: 86 (07/30 1600)  Labs: Recent Labs    06/01/21 2000 06/01/21 2056 06/01/21 2109 06/01/21 2153 06/02/21 0529 06/02/21 0529 06/02/21 1009 06/02/21 1821 06/03/21 0051  HGB  --   --  12.7*  --  13.0  --   --   --   --   HCT  --   --  37.6*  --  38.5*  --   --   --   --   PLT  --   --  277  --  269  --   --   --   --   APTT  --  30  --   --   --   --   --   --   --   LABPROT  --  15.0  --   --   --   --   --   --   --   INR  --  1.2  --   --   --   --   --   --   --   HEPARINUNFRC  --   --   --   --  <0.10*   < > 0.13* 0.40 0.43  CREATININE 1.40*  --   --   --  1.41*  --   --   --   --   TROPONINIHS 1,279*  --   --  1,127*  --   --   --   --   --    < > = values in this interval not displayed.     Estimated Creatinine Clearance: 77 mL/min (A) (by C-G formula based on SCr of 1.41 mg/dL (H)).   Medical History: Past Medical History:  Diagnosis Date   Alcohol abuse    CAD (coronary artery disease)    a. 05/2019 Cath: LM nl, LAD 15p, 87m, LCX large/nl, OM2 30, OM3 20, RCA 100 CTO w/ bridging L->R collats to RPDA.   Chronic combined systolic and diastolic CHF (congestive heart failure) (HCC)    a. 09/27/18 Echo: EF 20-25%, grade 2 DD; b. 03/2019 Echo: EF 20-25%; c. 05/2021 Echo: EF <10%, glob HK. GrII DD. Sev red RV fxn. Sev BAE. Mild MR. Mild-mod TR.   CKD (chronic kidney disease), stage II    Cocaine abuse (HCC)    Hyperlipidemia LDL goal <70    Hypertension    Ischemic cardiomyopathy    a. 09/27/18 Echo: EF 20-25%, grade 2 DD; b. 03/2019 Echo: EF 20-25%; c. 05/2019 Cath: RCA 100 CTA, otw nonobs dzs; d. 05/2021  Echo: EF <10%, glob HK. GrII DD. Sev red RV fxn.   Morbid obesity (HCC)    Noncompliance    Normocytic anemia     Medications:  No PTA anticoagulation only on ASA 81mg  Heparin Dosing Weight: 102.6 kg  Assessment: 60 y.o. male with history of CHF, HTN, and HLD who presented to the ED with worsening shortness of breath, orthopnea, and swelling in his legs. Pharmacy has been consulted for heparin dosing for ACS.  Heparin Dosing Weight: 102.6 kg Hgb: 12.7>13; Plts:277>269  Goal of Therapy:  Heparin level 0.3-0.7 units/ml Monitor platelets by  anticoagulation protocol: Yes  Date Time aPTT/HL Rate/Comment 07/30  0529  <0.10  subtherapeutic (drip paused) 07/30  1009 0.13  subtherapeutic , 1300 > 1650 un/hr 07/30 1821 0.40  Therapeutic, 1650 un/hr  07/31 0051 0.43  Therapeutic x 2  Plan:  Continue heparin infusion at 1650 units/hr  Recheck HL daily with AM labs while therapeutic on heparin. Monitor daily CBC, s/s of bleed  Otelia Sergeant, PharmD, [facility] 06/03/2021 2:49 AM

## 2021-06-04 DIAGNOSIS — Z7189 Other specified counseling: Secondary | ICD-10-CM

## 2021-06-04 DIAGNOSIS — I214 Non-ST elevation (NSTEMI) myocardial infarction: Secondary | ICD-10-CM | POA: Diagnosis not present

## 2021-06-04 DIAGNOSIS — F419 Anxiety disorder, unspecified: Secondary | ICD-10-CM | POA: Diagnosis not present

## 2021-06-04 DIAGNOSIS — E669 Obesity, unspecified: Secondary | ICD-10-CM

## 2021-06-04 DIAGNOSIS — I5021 Acute systolic (congestive) heart failure: Secondary | ICD-10-CM | POA: Diagnosis not present

## 2021-06-04 DIAGNOSIS — N1831 Chronic kidney disease, stage 3a: Secondary | ICD-10-CM | POA: Diagnosis not present

## 2021-06-04 DIAGNOSIS — I5043 Acute on chronic combined systolic (congestive) and diastolic (congestive) heart failure: Secondary | ICD-10-CM | POA: Diagnosis not present

## 2021-06-04 DIAGNOSIS — Z515 Encounter for palliative care: Secondary | ICD-10-CM

## 2021-06-04 LAB — BASIC METABOLIC PANEL
Anion gap: 9 (ref 5–15)
BUN: 43 mg/dL — ABNORMAL HIGH (ref 6–20)
CO2: 26 mmol/L (ref 22–32)
Calcium: 9.1 mg/dL (ref 8.9–10.3)
Chloride: 101 mmol/L (ref 98–111)
Creatinine, Ser: 1.72 mg/dL — ABNORMAL HIGH (ref 0.61–1.24)
GFR, Estimated: 46 mL/min — ABNORMAL LOW (ref >60)
Glucose, Bld: 109 mg/dL — ABNORMAL HIGH (ref 70–99)
Potassium: 3.6 mmol/L (ref 3.5–5.1)
Sodium: 136 mmol/L (ref 135–145)

## 2021-06-04 LAB — BRAIN NATRIURETIC PEPTIDE: B Natriuretic Peptide: 3871.2 pg/mL — ABNORMAL HIGH (ref 0.0–100.0)

## 2021-06-04 LAB — CBC
HCT: 38.9 % — ABNORMAL LOW (ref 39.0–52.0)
Hemoglobin: 12.8 g/dL — ABNORMAL LOW (ref 13.0–17.0)
MCH: 27.8 pg (ref 26.0–34.0)
MCHC: 32.9 g/dL (ref 30.0–36.0)
MCV: 84.6 fL (ref 80.0–100.0)
Platelets: 267 10*3/uL (ref 150–400)
RBC: 4.6 MIL/uL (ref 4.22–5.81)
RDW: 16.3 % — ABNORMAL HIGH (ref 11.5–15.5)
WBC: 6 10*3/uL (ref 4.0–10.5)
nRBC: 0 % (ref 0.0–0.2)

## 2021-06-04 LAB — HEPARIN LEVEL (UNFRACTIONATED): Heparin Unfractionated: <0.1 IU/mL — ABNORMAL LOW (ref 0.30–0.70)

## 2021-06-04 MED ORDER — ENOXAPARIN SODIUM 60 MG/0.6ML IJ SOSY
0.5000 mg/kg | PREFILLED_SYRINGE | INTRAMUSCULAR | Status: DC
  Filled 2021-06-04 (×2): qty 0.6

## 2021-06-04 MED ORDER — TORSEMIDE 20 MG PO TABS
40.0000 mg | ORAL_TABLET | Freq: Two times a day (BID) | ORAL | Status: DC
  Administered 2021-06-04 – 2021-06-05 (×2): 40 mg via ORAL
  Filled 2021-06-04 (×2): qty 2

## 2021-06-04 NOTE — Progress Notes (Signed)
This RN walked into the patient's room to find IV catheter dislodged. Patient also removed himself from telemetry monitoring. The patient also removed his supplemental oxygen. This RN educated the patient on the importance of all 3 interventions and the patient adamantly refuses to have a new IV placed, telemetry, and oxygen placed. Dr. Nelson Chimes made aware.

## 2021-06-04 NOTE — Progress Notes (Signed)
   Heart Failure Nurse Navigator Note  Attempted to meet with patient today to discuss heart failure and how he takes care of himself.  His driver's license, Social Security card, [employer] card, along with phlebotomy identification and money which he stated was the amount of $300-$400 then with [employer] he stated was $200, that was missing.  Attempted several times to redirect but it was fixated on the above.  We will attempt at another time.  Tresa Endo RN CHFN

## 2021-06-04 NOTE — Plan of Care (Signed)
  Problem: Education: Goal: Knowledge of General Education information will improve Description: Including pain rating scale, medication(s)/side effects and non-pharmacologic comfort measures 06/04/2021 0929 by Ansel Bong, RN Outcome: Progressing 06/04/2021 0929 by Ansel Bong, RN Outcome: Progressing   Problem: Health Behavior/Discharge Planning: Goal: Ability to manage health-related needs will improve 06/04/2021 0929 by Ansel Bong, RN Outcome: Progressing 06/04/2021 0929 by Ansel Bong, RN Outcome: Progressing   Problem: Clinical Measurements: Goal: Ability to maintain clinical measurements within normal limits will improve 06/04/2021 0929 by Ansel Bong, RN Outcome: Progressing 06/04/2021 0929 by Ansel Bong, RN Outcome: Progressing Goal: Will remain free from infection 06/04/2021 0929 by Ansel Bong, RN Outcome: Progressing 06/04/2021 0929 by Ansel Bong, RN Outcome: Progressing Goal: Diagnostic test results will improve 06/04/2021 0929 by Ansel Bong, RN Outcome: Progressing 06/04/2021 0929 by Ansel Bong, RN Outcome: Progressing Goal: Respiratory complications will improve 06/04/2021 0929 by Ansel Bong, RN Outcome: Progressing 06/04/2021 0929 by Ansel Bong, RN Outcome: Progressing Goal: Cardiovascular complication will be avoided 06/04/2021 0929 by Ansel Bong, RN Outcome: Progressing 06/04/2021 0929 by Ansel Bong, RN Outcome: Progressing   Problem: Activity: Goal: Risk for activity intolerance will decrease 06/04/2021 0929 by Ansel Bong, RN Outcome: Progressing 06/04/2021 0929 by Ansel Bong, RN Outcome: Progressing   Problem: Nutrition: Goal: Adequate nutrition will be maintained 06/04/2021 0929 by Ansel Bong, RN Outcome: Progressing 06/04/2021 0929 by Ansel Bong, RN Outcome: Progressing   Problem: Coping: Goal: Level of anxiety will decrease 06/04/2021 0929 by Ansel Bong, RN Outcome: Progressing 06/04/2021 0929 by  Ansel Bong, RN Outcome: Progressing   Problem: Elimination: Goal: Will not experience complications related to bowel motility 06/04/2021 0929 by Ansel Bong, RN Outcome: Progressing 06/04/2021 0929 by Ansel Bong, RN Outcome: Progressing Goal: Will not experience complications related to urinary retention 06/04/2021 0929 by Ansel Bong, RN Outcome: Progressing 06/04/2021 0929 by Ansel Bong, RN Outcome: Progressing   Problem: Pain Managment: Goal: General experience of comfort will improve 06/04/2021 0929 by Ansel Bong, RN Outcome: Progressing 06/04/2021 0929 by Ansel Bong, RN Outcome: Progressing   Problem: Safety: Goal: Ability to remain free from injury will improve 06/04/2021 0929 by Ansel Bong, RN Outcome: Progressing 06/04/2021 0929 by Ansel Bong, RN Outcome: Progressing   Problem: Skin Integrity: Goal: Risk for impaired skin integrity will decrease 06/04/2021 0929 by Ansel Bong, RN Outcome: Progressing 06/04/2021 0929 by Ansel Bong, RN Outcome: Progressing

## 2021-06-04 NOTE — Progress Notes (Signed)
PROGRESS NOTE    Dale [name]  FVC:[id number] DOB: 1961-05-28 DOA: 06/01/2021 PCP: Thayer Headings, MD   Brief Narrative: Taken from H&P.  Dale [name] is a 60 y.o. male with medical history significant for hyperlipidemia, hypertension, neuropathy, severe heart failure, history of cocaine use, CKD 3A, presents emergency department for chief concerns of shortness of breath.   He states that he has not been able to take his medications for about 1 month.  He states that the medication are too expensive for him.  He denies any fever, nausea, vomiting, chest pain, abdominal pain, dysuria, hematuria, diarrhea.  Labs in the emergency department was remarkable for sodium 143, potassium 3.6, chloride 106, bicarb 28, BUN 27, serum creatinine 1.40, nonfasting blood glucose 97, EGFR 59, troponin 1279, EKG concerning for some ischemic changes, he was started on heparin infusion.  Multiple recent hospitalizations for similar reasons and being noncompliant.  Patient continued to use drugs, last cocaine use was 2 weeks ago, regularly use marijuana.  History of leaving AMA couple of times, recently admitted at [facility] and discharged in mid July, advised to pick up medications which he never did.  Recent echocardiogram with EF of less than 10%, not a candidate for advanced therapies per cardiology due to being noncompliant.  They do not think that recath and PCI will help as he will not take his antiplatelets regularly.  He was started on aggressive diuresis. Will remain high risk for readmission, deterioration and death. Patient refusing to wear telemetry-follow agreed with cardiology. Cardiology started him on Lasix infusion. Palliative care was also consulted.  Patient pulled his IV today.  He was very fixated that someone stalls his money, refusing to place another IV and telemetry.  Administration involved.  Cardiology temporarily switched him to torsemide.  High risk for leaving AMA,  readmission, deterioration and death.  Subjective: Patient was still quite short of breath when seen today.  Denies any chest pain.  Assessment & Plan:   Principal Problem:   Acute systolic heart failure (HCC) Active Problems:   Essential hypertension   Obesity (BMI 30-39.9)   Substance abuse ([place])   Anxiety and depression   Acute CHF (congestive heart failure) (HCC)   Acute on chronic combined systolic (congestive) and diastolic (congestive) heart failure (HCC)   CKD (chronic kidney disease), stage III (HCC)  Acute on chronic combined heart failure.  Patient with extremely low EF of less than 10% on echocardiogram done recently.  History of ischemic cardiomyopathy and being noncompliant.  Multiple recent admissions and leaving AMA. Cardiology was consulted-not a good candidate for any advance therapies or interventions due to being noncompliant and other behavioral issues. -IV Lasix was switched with Lasix infusion, currently on torsemide due to losing his IV, hopefully can be placed back on infusion. -Continue carvedilol and losartan -Strict intake and output -Daily weight and BMP -Heart healthy diet with fluid restriction of 1500 mL  Elevated troponin.  Patient did not had any chest pain but do have some ischemic changes on EKG.  Most likely secondary to heart strain with severe heart failure exacerbation and being noncompliant.  Cardiology is recommending continuation of heparin infusion for 48 hours. Patient is not a good candidate for cardiac catheterization as if needing PCI he will not be able to take antiplatelets regularly. -Completed 48 hours of heparin infusion. -Currently troponin decreasing. -Continue to monitor  Hyperlipidemia. -Home dose of atorvastatin was resumed  Hypertension. -Continue home losartan.  Objective: Vitals:   06/04/21  3300 06/04/21 0510 06/04/21 0900 06/04/21 1200  BP: 113/88  105/84 108/79  Pulse: 78  82 72  Resp: _0 Temp: 98 F  (36.7 C)  98 F (36.7 C) 97.8 F (36.6 C)  TempSrc:   Oral Oral  SpO2: 100%  100% 90%  Weight:  124.6 kg 120 kg   Height:   5' 11" (1.803 m)     Intake/Output Summary (Last 24 hours) at 06/04/2021 1611 Last data filed at 06/04/2021 0500 Gross per 24 hour  Intake 1198.66 ml  Output 900 ml  Net 298.66 ml    Filed Weights   06/03/21 0500 06/04/21 0510 06/04/21 0900  Weight: 119.3 kg 124.6 kg 120 kg    Examination:  General.  Well-developed gentleman, in no acute distress. Pulmonary.  Lungs clear bilaterally, normal respiratory effort, appears little short of breath. CV.  Regular rate and rhythm, no JVD, rub or murmur. Abdomen.  Soft, nontender, nondistended, BS positive. CNS.  Alert and oriented x3.  No focal neurologic deficit. Extremities.  2+ LE edema, no cyanosis, pulses intact and symmetrical. Psychiatry.  Judgment and insight appears normal.   DVT prophylaxis: Heparin Code Status: Full Family Communication: Discussed with patient Disposition Plan:  Status is: Inpatient  Remains inpatient appropriate because:Inpatient level of care appropriate due to severity of illness  Dispo: The patient is from: Home              Anticipated d/c is to: Home              Patient currently is not medically stable to d/c.   Difficult to place patient No              Level of care: Progressive Cardiac  All the records are reviewed and case discussed with Care Management/Social Worker. Management plans discussed with the patient, nursing and they are in agreement.  Consultants:  Cardiology  Procedures:  Antimicrobials:   Data Reviewed: I have personally reviewed following labs and imaging studies  CBC: Recent Labs  Lab 06/01/21 2109 06/02/21 0529 06/03/21 0440 06/04/21 0443  WBC 6.0 6.3 6.7 6.0  NEUTROABS 4.3  --   --   --   HGB 12.7* 13.0 12.6* 12.8*  HCT 37.6* 38.5* 37.6* 38.9*  MCV 86.8 85.7 84.7 84.6  PLT 277 269 279 762    Basic Metabolic Panel: Recent Labs   Lab 06/01/21 2000 06/02/21 0529 06/03/21 0440 06/04/21 0443  NA 143 138 139 136  K 3.6 3.8 3.7 3.6  CL 106 104 101 101  CO2 _1 GLUCOSE 97 130* 120* 109*  BUN 27* 29* 37* 43*  CREATININE 1.40* 1.41* 1.55* 1.72*  CALCIUM 9.1 9.0 9.3 9.1  MG 2.3  --   --   --     GFR: Estimated Creatinine Clearance: 62.5 mL/min (A) (by C-G formula based on SCr of 1.72 mg/dL (H)). Liver Function Tests: Recent Labs  Lab 06/01/21 2000  AST 30  ALT 30  ALKPHOS 92  BILITOT 1.8*  PROT 7.4  ALBUMIN 3.7    No results for input(s): LIPASE, AMYLASE in the last 168 hours. No results for input(s): AMMONIA in the last 168 hours. Coagulation Profile: Recent Labs  Lab 06/01/21 2056  INR 1.2    Cardiac Enzymes: No results for input(s): CKTOTAL, CKMB, CKMBINDEX, TROPONINI in the last 168 hours. BNP (last 3 results) No results for input(s): PROBNP in the last 8760 hours. HbA1C: No results for  input(s): HGBA1C in the last 72 hours. CBG: No results for input(s): GLUCAP in the last 168 hours. Lipid Profile: No results for input(s): CHOL, HDL, LDLCALC, TRIG, CHOLHDL, LDLDIRECT in the last 72 hours. Thyroid Function Tests: No results for input(s): TSH, T4TOTAL, FREET4, T3FREE, THYROIDAB in the last 72 hours. Anemia Panel: No results for input(s): VITAMINB12, FOLATE, FERRITIN, TIBC, IRON, RETICCTPCT in the last 72 hours. Sepsis Labs: No results for input(s): PROCALCITON, LATICACIDVEN in the last 168 hours.  Recent Results (from the past 240 hour(s))  Resp Panel by RT-PCR (Flu A&B, Covid) Nasopharyngeal Swab     Status: None   Collection Time: 06/01/21  8:52 PM   Specimen: Nasopharyngeal Swab; Nasopharyngeal(NP) swabs in vial transport medium  Result Value Ref Range Status   SARS Coronavirus 2 by RT PCR NEGATIVE NEGATIVE Final    Comment: (NOTE) SARS-CoV-2 target nucleic acids are NOT DETECTED.  The SARS-CoV-2 RNA is generally detectable in upper respiratory specimens during the  acute phase of infection. The lowest concentration of SARS-CoV-2 viral copies this assay can detect is 138 copies/mL. A negative result does not preclude SARS-Cov-2 infection and should not be used as the sole basis for treatment or other patient management decisions. A negative result may occur with  improper specimen collection/handling, submission of specimen other than nasopharyngeal swab, presence of viral mutation(s) within the areas targeted by this assay, and inadequate number of viral copies(<138 copies/mL). A negative result must be combined with clinical observations, patient history, and epidemiological information. The expected result is Negative.  Fact Sheet for Patients:  EntrepreneurPulse.com.au  Fact Sheet for Healthcare Providers:  IncredibleEmployment.be  This test is no t yet approved or cleared by the [place] FDA and  has been authorized for detection and/or diagnosis of SARS-CoV-2 by FDA under an Emergency Use Authorization (EUA). This EUA will remain  in effect (meaning this test can be used) for the duration of the COVID-19 declaration under Section 564(b)(1) of the Act, 21 U.S.C.section 360bbb-3(b)(1), unless the authorization is terminated  or revoked sooner.       Influenza A by PCR NEGATIVE NEGATIVE Final   Influenza B by PCR NEGATIVE NEGATIVE Final    Comment: (NOTE) The Xpert Xpress SARS-CoV-2/FLU/RSV plus assay is intended as an aid in the diagnosis of influenza from Nasopharyngeal swab specimens and should not be used as a sole basis for treatment. Nasal washings and aspirates are unacceptable for Xpert Xpress SARS-CoV-2/FLU/RSV testing.  Fact Sheet for Patients: EntrepreneurPulse.com.au  Fact Sheet for Healthcare Providers: IncredibleEmployment.be  This test is not yet approved or cleared by the [place] FDA and has been authorized for detection and/or  diagnosis of SARS-CoV-2 by FDA under an Emergency Use Authorization (EUA). This EUA will remain in effect (meaning this test can be used) for the duration of the COVID-19 declaration under Section 564(b)(1) of the Act, 21 U.S.C. section 360bbb-3(b)(1), unless the authorization is terminated or revoked.  Performed at [facility], [address] [place], [place] [address]       Radiology Studies: No results found.  Scheduled Meds:  aspirin EC  81 mg Oral Daily   atorvastatin  40 mg Oral Daily   carvedilol  6.25 mg Oral BID WC   enoxaparin (LOVENOX) injection  0.5 mg/kg Subcutaneous Q24H   isosorbide-hydrALAZINE  1 tablet Oral TID   potassium chloride SA  20 mEq Oral Daily   torsemide  40 mg Oral BID   Continuous Infusions:     LOS: 2  days   Time spent: 37 minutes. More than 50% of the time was spent in counseling/coordination of care  Lorella Nimrod, MD [facility]  If 7PM-7AM, please contact night-coverage [email or web address]  06/04/2021, 4:11 PM   This record has been created using Systems analyst. Errors have been sought and corrected,but may not always be located. Such creation errors do not reflect on the standard of care.

## 2021-06-04 NOTE — Consult Note (Signed)
Palliative Care Consult Note                                  Date: 06/04/2021   Patient Name: Dale [name]  DOB: 1961-06-12  MRN: [id number]  Age / Sex: 60 y.o., male  PCP: Nahser, Wonda Cheng, MD Referring Physician: Lorella Nimrod, MD  Reason for Consultation: Establishing goals of care  HPI/Patient Profile: Palliative Care consult requested for goals of care discussion in this 60 y.o. male  with past medical history of combined CHF, hyperlipidemia, hypertension, neuropathy, history of cocaine use, CKD IIIa, obesity, and noncompliance. He was  admitted on 06/01/2021 from home with complaints of shortness of breath. Patient reports he was unable to take his medications for over a month due to financial difficulty. Chest x-ray showed vascular congestion. Patient is receiving IV lasix and is being followed by Cardiology. Poor candidate for milrinone.   Past Medical History:  Diagnosis Date   Alcohol abuse    CAD (coronary artery disease)    a. 05/2019 Cath: LM nl, LAD 15p, 49m LCX large/nl, OM2 30, OM3 20, RCA 100 CTO w/ bridging L->R collats to RPDA.   Chronic combined systolic and diastolic CHF (congestive heart failure) ([place]    a. 09/27/18 Echo: EF 20-25%, grade 2 DD; b. 03/2019 Echo: EF 20-25%; c. 05/2021 Echo: EF <10%, glob HK. GrII DD. Sev red RV fxn. Sev BAE. Mild MR. Mild-mod TR.   CKD (chronic kidney disease), stage II    Cocaine abuse ([place]    Hyperlipidemia LDL goal <70    Hypertension    Ischemic cardiomyopathy    a. 09/27/18 Echo: EF 20-25%, grade 2 DD; b. 03/2019 Echo: EF 20-25%; c. 05/2019 Cath: RCA 100 CTA, otw nonobs dzs; d. 05/2021 Echo: EF <10%, glob HK. GrII DD. Sev red RV fxn.   Morbid obesity ([place]    Noncompliance    Normocytic anemia      Subjective:   This NP NOsborne Omanreviewed medical records, received report from team, assessed the patient and then met at the patient's bedside with Dale [name] discuss  diagnosis, prognosis, GOC, EOL wishes disposition and options.  He is awake, alert and oriented x3. He denies pain but does endorses intermittent shortness of breath which he relates to his "CHF". He is very anxious and angry. Has removed IV site, telemetry, and tossing things around the room. He states he had his wallet and $300 cash in his pillow case and the case was removed subsequently along with his belongings. RN is aware and following up. Patient states he is hoping his property will be returned also eluding to his plans to leave the hospital afterwards.    Concept of Palliative Care was introduced as specialized medical care for people and their families living with serious illness.  It focuses on providing relief from the symptoms and stress of a serious illness.  The goal is to improve quality of life for both the patient and the family. Values and goals of care important to patient and family were attempted to be elicited.  I created space and opportunity for patient to explore state of health prior to admission, thoughts, and feelings. Patient states he lives in the home with his daughter. He is having some financial difficulty which he states is why he is so angry about the his missing money. Shares he has not taken medications due to lack  of finances. He acknowledges he does not always follow the appropriate diet or have the best "habits" outside of the hospital. He is able to perform ADLs independently. States his children provides some support at times.   We discussed His current illness and what it means in the larger context of His on-going co-morbidities. Natural disease trajectory and expectations were discussed.  Dale [name] verbalized understanding of current illness and co-morbidities. He states "I know my heart and CHF is bad. What am I going to do. Live with it until I drop dead I guess!" Support provided.   He endorses previous cocaine use but states he has not used in several (3-4)  months (however toxicology from 05/11/21 was positive). Also endorses marijuana use.   I attempted to discuss compliance with medical management for some stability with honesty of poor long-term prognosis. I encouraged him to evaluate his lifestyle and his quality of life. He verbalizes understanding and states his quality of life is just fine given the cards he has been dealt with.   I discussed the importance of continued conversation with family and their medical providers regarding overall plan of care and treatment options, ensuring decisions are within the context of the patients values and GOCs.  Questions and concerns were addressed.  Patient was encouraged to call with questions or concerns.  PMT will continue to support holistically as needed.  Life Review: Lives with daughter. He is single with 2 adult children. He worked for many years in a [place] but has been disabled for many years. Enjoys spending time with his friends. Christian faith.    Objective:   Primary Diagnoses: Present on Admission:  Essential hypertension  Anxiety and depression  Substance abuse (HCC)  Obesity (BMI 30-39.9)  Acute systolic heart failure (HCC)  Acute on chronic combined systolic (congestive) and diastolic (congestive) heart failure (HCC)   Scheduled Meds:  aspirin EC  81 mg Oral Daily   atorvastatin  40 mg Oral Daily   carvedilol  6.25 mg Oral BID WC   enoxaparin (LOVENOX) injection  0.5 mg/kg Subcutaneous Q24H   isosorbide-hydrALAZINE  1 tablet Oral TID   potassium chloride SA  20 mEq Oral Daily    Continuous Infusions:  furosemide (LASIX) 200 mg in dextrose 5% 100 mL (35m/mL) infusion 10 mg/hr (06/03/21 1257)    PRN Meds: acetaminophen **OR** acetaminophen, ondansetron **OR** ondansetron (ZOFRAN) IV  Allergies  Allergen Reactions   Hydrocodone Itching    Review of Systems  Respiratory:  Positive for shortness of breath.   Neurological:  Positive for weakness.  Unless otherwise  noted, a complete review of systems is negative.  Physical Exam General: Anxious, obese, chronically-ill appearing Cardiovascular: Tachycardic  Pulmonary:  diminished bilaterally  Abdomen: soft, nontender, obese, + bowel sounds Extremities: bilateral lower extremity edema, no joint deformities Skin: no rashes, warm and dry, body odor  Neurological: AAO x3, mood appropriate   Vital Signs:  BP 108/79 (BP Location: Left Arm)   Pulse 72   Temp 97.8 F (36.6 C) (Oral)   Resp 19   Ht 5' 11"  (1.803 m)   Wt 120 kg   SpO2 90%   BMI 36.90 kg/m  Pain Scale: 0-10   Pain Score: 0-No pain  SpO2: SpO2: 90 % O2 Device:SpO2: 90 % O2 Flow Rate: .O2 Flow Rate (L/min): 4 L/min  IO: Intake/output summary:  Intake/Output Summary (Last 24 hours) at 06/04/2021 1408 Last data filed at 06/04/2021 0500 Gross per 24 hour  Intake 1258.66 ml  Output 1425 ml  Net -166.34 ml    LBM: Last BM Date: 06/02/21 Baseline Weight: Weight: 122.5 kg Most recent weight: Weight: 120 kg      Palliative Assessment/Data: PPS 30-40%   Advanced Care Planning:   Primary Decision Maker: NEXT OF KIN  Code Status/Advance Care Planning: Full code  A discussion was had today regarding advanced directives. Concepts specific to code status, artifical feeding and hydration, continued IV antibiotics and rehospitalization was had. He states he does not have an advanced directive, offered to assist in getting completing however he declined.   I discussed at length with Shaquile his current full code status with consideration to his current illness and co-morbidities. He is realistic in expressing he knows his CHF is "bad" however he states he would want aggressive interventions and expressed wishes to remain a full code. He states if something was to happen his daughter would make decisions. He would not want to be on pro-longed life support.   I did not approach discussions regarding hospice or palliative given wishes for  all aggressive interventions and patient becoming more agitated in the setting of alleged missing properties.   Assessment & Plan:   SUMMARY OF RECOMMENDATIONS   Full Code-as confirmed by patient  Continue with current plan of care, treat aggressive Patient expressed wishes to return home with his family. He is able to verbalize understanding of poor prognosis long-term and severity of CHF. However is not interested in discussing anything further than treatment options. He is quite upset about alleged missing properties and ended discussions stating he had nothing else to say, wanted his properties found and returned so he could go home.  PMT will continue to support and follow as needed. Please call team line with urgent needs.  Symptom Management:  Per Attending  Additional Recommendations (Limitations, Scope, Preferences): Full Scope Treatment  Psycho-social/Spiritual:  Desire for further Chaplaincy support: yes Additional Recommendations: Caregiving  Support/Resources, Medicaid/Financial Assistance, and ongoing discussions   Prognosis:  Guarded-Poor   Discharge Planning:  To Be Determined   Patient  expressed understanding and was in agreement with this plan.   Time In: 1230 Time Out: 1320 Time Total: 50 min.   Visit consisted of counseling and education dealing with the complex and emotionally intense issues of symptom management and palliative care in the setting of serious and potentially life-threatening illness.Greater than 50%  of this time was spent counseling and coordinating care related to the above assessment and plan.  Signed by:  Alda Lea, AGPCNP-BC Palliative Medicine Team  Phone: [phone] Pager: [phone] Amion: Bjorn Pippin   Thank you for allowing the Palliative Medicine Team to assist in the care of this patient. Please utilize secure chat with additional questions, if there is no response within 30 minutes please call the above phone  number. Palliative Medicine Team providers are available by phone from 7am to 5pm daily and can be reached through the team cell phone.  Should this patient require assistance outside of these hours, please call the patient's attending physician.

## 2021-06-04 NOTE — Progress Notes (Signed)
Called to speak with patient after request made. We chatted for a period of time and I prayed with him as requested. I asked NT to make sure he  gets a good shower, patient does have a strong body odor. I have also consulted with CSW to provide clothing, if available for him to have upon release.

## 2021-06-04 NOTE — Progress Notes (Addendum)
Progress Note  Patient Name: Dale [name] Date of Encounter: 06/04/2021  Primary Cardiologist: Kristeen Miss, MD   Subjective   No CP.  Reports SOB that comes and goes q21m.  Reports significant stress surrounding losing his wallet and $300 in cash.  States he is let the nurses know about this and they are working on retrieving the money, but it is taking a while, which is stressful for him.  He reportedly had this money in his pillow.   Inpatient Medications    Scheduled Meds:  aspirin EC  81 mg Oral Daily   atorvastatin  40 mg Oral Daily   carvedilol  6.25 mg Oral BID WC   enoxaparin (LOVENOX) injection  0.5 mg/kg Subcutaneous Q24H   isosorbide-hydrALAZINE  1 tablet Oral TID   potassium chloride SA  20 mEq Oral Daily   Continuous Infusions:  furosemide (LASIX) 200 mg in dextrose 5% 100 mL (2mg /mL) infusion 10 mg/hr (06/03/21 1257)   PRN Meds: acetaminophen **OR** acetaminophen, ondansetron **OR** ondansetron (ZOFRAN) IV   Vital Signs    Vitals:   06/04/21 0412 06/04/21 0510 06/04/21 0900 06/04/21 1200  BP: 113/88  105/84 108/79  Pulse: 78  82 72  Resp: 20  19 19   Temp: 98 F (36.7 C)  98 F (36.7 C) 97.8 F (36.6 C)  TempSrc:   Oral Oral  SpO2: 100%  100% 90%  Weight:  124.6 kg 120 kg   Height:   5\' 11"  (1.803 m)     Intake/Output Summary (Last 24 hours) at 06/04/2021 1214 Last data filed at 06/04/2021 0500 Gross per 24 hour  Intake 1498.66 ml  Output 2125 ml  Net -626.34 ml   Last 3 Weights 06/04/2021 06/04/2021 06/03/2021  Weight (lbs) 264 lb 8.8 oz 274 lb 12.8 oz 263 lb 1.6 oz  Weight (kg) 120 kg 124.648 kg 119.341 kg      Telemetry    Not on telemetry.  Recommend restart. Previously NSR, PVCs, NSVT- Personally Reviewed  ECG    No new tracings - Personally Reviewed  Physical Exam   GEN: Anxious appearing, obese.  Sitting on edge of bed. Neck: JVD difficult to assess due to body habitus and position of patient Cardiac: Tachycardic but  regular, distant heart sounds, no murmurs, rubs, or gallops.  Respiratory: Distant breath sounds bilaterally. GI: Obese, soft, nontender, non-distended  MS: Moderate to 1+ bilateral edema; No deformity. Neuro:  Nonfocal  Psych: Normal affect   Labs    High Sensitivity Troponin:   Recent Labs  Lab 05/11/21 0825 05/11/21 1205 06/01/21 2000 06/01/21 2153  TROPONINIHS 40* 36* 1,279* 1,127*      Chemistry Recent Labs  Lab 06/01/21 2000 06/02/21 0529 06/03/21 0440 06/04/21 0443  NA 143 138 139 136  K 3.6 3.8 3.7 3.6  CL 106 104 101 101  CO2 28 25 26 26   GLUCOSE 97 130* 120* 109*  BUN 27* 29* 37* 43*  CREATININE 1.40* 1.41* 1.55* 1.72*  CALCIUM 9.1 9.0 9.3 9.1  PROT 7.4  --   --   --   ALBUMIN 3.7  --   --   --   AST 30  --   --   --   ALT 30  --   --   --   ALKPHOS 92  --   --   --   BILITOT 1.8*  --   --   --   GFRNONAA 59* 58* 52* 46*  ANIONGAP 9 9  12 9     Hematology Recent Labs  Lab 06/02/21 0529 06/03/21 0440 06/04/21 0443  WBC 6.3 6.7 6.0  RBC 4.49 4.44 4.60  HGB 13.0 12.6* 12.8*  HCT 38.5* 37.6* 38.9*  MCV 85.7 84.7 84.6  MCH 29.0 28.4 27.8  MCHC 33.8 33.5 32.9  RDW 16.2* 16.1* 16.3*  PLT 269 279 267    BNP Recent Labs  Lab 06/01/21 2109  BNP 4,001.7*     DDimer No results for input(s): DDIMER in the last 168 hours.   Radiology    No results found.  Cardiac Studies     a. 05/2019 Cath: LM nl, LAD 15p, 74m, LCX large/nl, OM2 30, OM3 20, RCA 100 CTO w/ bridging L->R collats to RPDA.    b. 09/27/18 Echo: EF 20-25%, grade 2 DD c. 03/2019 Echo: EF 20-25%  d. 05/2021 Echo: EF <10%, glob HK. GrII DD. Sev red RV fxn. Sev BAE. Mild MR. Mild-mod TR.  Echo 05/12/21  1. No left ventriculatr thrombus is seen. Left ventricular ejection  fraction, by estimation, is <10%. The left ventricle has severely  decreased function. The left ventricle demonstrates global hypokinesis.  The left ventricular internal cavity size was  severely dilated. Left  ventricular diastolic parameters are consistent  with Grade II diastolic dysfunction (pseudonormalization). Elevated left  atrial pressure.   2. Right ventricular systolic function is severely reduced. The right  ventricular size is severely enlarged. There is mildly elevated pulmonary  artery systolic pressure.   3. Left atrial size was severely dilated.   4. Right atrial size was severely dilated.   5. The mitral valve is normal in structure. Mild mitral valve  regurgitation.   6. Tricuspid valve regurgitation is mild to moderate.   7. The aortic valve is tricuspid. Aortic valve regurgitation is not  visualized. No aortic stenosis is present.   8. The inferior vena cava is dilated in size with <50% respiratory  variability, suggesting right atrial pressure of 15 mmHg.   Patient Profile     60 y.o. male with history of CAD, chronic combined syst/diast CHF, mixed ICM/NICM, HTN, HL, cocaine/etoh abuse, obesity, CKD II, anemia, and noncompliance, who was admitted 7/29 secondary to recurrent CHF in the setting of ongoing noncompliance.  Assessment & Plan    Acute on chronic systolic and diastolic heart failure --Reports ongoing episodes of shortness of breath.  History of systolic dysfunction, dating back to 2019 with multiple admissions 2/2 noncompliance and polysubstance use.  Recent discharge from [facility] for HF exacerbation. Severely reduced EF <10% on 7/29 echo, G2 DD, elevated LAP, severely enlarged RV with mildly elevated PASP, biatrial severe enlargement, mild MR, mild to moderate TR, RAP 15 mmHg. -- Still volume overloaded with net -988cc total and -761.3cc yesterday. Wt 124kg  120kg. BNP significantly elevated on 7/29 at 4,001.7. Will order another BNP to trend. Consider that I/Os may not be accurate. --Continue IV lasix as renal function and BP allow. Cr bumped overnight and since transitioned to IV lasix drip. Consider switch back to IV lasix injections--> consider also that pt  continues to remove his IV, which is influencing diuresis, and thus may benefit more from BID lasix.  Will discuss with MD. As previously noted, likely a poor candidate for milrinone.  Most recent BP soft. --Escalation of GDMT limited by renal function.  ARB held for rising Cr.  No ACE/ARB/ARNI/MRA due to renal function and medication noncompliance.  Continue BiDil. Poor candidate for SGLT2 inhibitor.  Coronary  artery disease/demand ischemia --Denies chest pain.  History of CTO of RCA 05/2019.  Poor LHC/PCI candidate in the setting of CKD and noncompliance.  HS Tn elevated to 1279.0, now down-trended. S/p 48 hours of IV heparin.  Continue medical therapy with ASA, Coreg, statin.   NSVT --Not on telemetry.  5 beats of NSVT on 7/31.  Continue beta-blocker.  Given severely reduced EF, recommend restart of telemetry if patient agreeable.  Hypokalemia -- Replete with goal 4.0.  CKDII --Continue to monitor with daily BMET on IV diuresis.  Anemia of chronic disease  -- Most recent CBCs show stable H&H.  Continue to monitor.  Polysubstance use --UDS negative for cocaine, positive for marijuana.  Complete cessation advised.  Medication noncompliance --Will need social assistance prior to discharge.  For questions or updates, please contact [facility] Please consult [email or web address] for contact info under        Signed, Lennon Alstrom, PA-C  06/04/2021, 12:14 PM

## 2021-06-04 NOTE — Progress Notes (Signed)
Pt took off telemetry monitor several times, each time education given and put back on. Education on fluid overload, diet, lifestyle changes, and overall care for his heart given multiple times in different ways through night (verbal, written, demonstration). Pt became upset with fluid restriction but with education was agreeable to a limited amount before and after midnight. Pt had to be reminded several times to stay sitting up to decrease dyspnea but will need further reinforcement. Discussed tripoding to open up lungs rather than slouching in bed. Pt refused to try IS at this will. Will continue to educate on the importance of this treatment.

## 2021-06-05 DIAGNOSIS — I5043 Acute on chronic combined systolic (congestive) and diastolic (congestive) heart failure: Secondary | ICD-10-CM | POA: Diagnosis not present

## 2021-06-05 DIAGNOSIS — I5023 Acute on chronic systolic (congestive) heart failure: Secondary | ICD-10-CM

## 2021-06-05 LAB — BASIC METABOLIC PANEL
Anion gap: 7 (ref 5–15)
BUN: 46 mg/dL — ABNORMAL HIGH (ref 6–20)
CO2: 27 mmol/L (ref 22–32)
Calcium: 9 mg/dL (ref 8.9–10.3)
Chloride: 103 mmol/L (ref 98–111)
Creatinine, Ser: 1.76 mg/dL — ABNORMAL HIGH (ref 0.61–1.24)
GFR, Estimated: 45 mL/min — ABNORMAL LOW (ref >60)
Glucose, Bld: 112 mg/dL — ABNORMAL HIGH (ref 70–99)
Potassium: 4 mmol/L (ref 3.5–5.1)
Sodium: 137 mmol/L (ref 135–145)

## 2021-06-05 NOTE — Progress Notes (Signed)
Progress Note  Patient Name: Dale [name] Date of Encounter: 06/05/2021  Primary Cardiologist: Kristeen Miss, MD   Subjective   Wants to leave. Refusing IV access and telemetry.  Denies CP. Reports SOB that "comes and goes."  Inpatient Medications    Scheduled Meds:  aspirin EC  81 mg Oral Daily   atorvastatin  40 mg Oral Daily   carvedilol  6.25 mg Oral BID WC   enoxaparin (LOVENOX) injection  0.5 mg/kg Subcutaneous Q24H   isosorbide-hydrALAZINE  1 tablet Oral TID   potassium chloride SA  20 mEq Oral Daily   torsemide  40 mg Oral BID   Continuous Infusions:   PRN Meds: ondansetron **OR** ondansetron (ZOFRAN) IV   Vital Signs    Vitals:   06/04/21 2021 06/05/21 0507 06/05/21 0825 06/05/21 0904  BP: 102/73 109/75 112/82   Pulse: 81 83 85   Resp: (!) 22 (!) 26 19   Temp: 98.4 F (36.9 C) 97.7 F (36.5 C) 98 F (36.7 C)   TempSrc:  Oral Oral   SpO2: 100% 100% 100%   Weight:    121.7 kg  Height:        Intake/Output Summary (Last 24 hours) at 06/05/2021 1103 Last data filed at 06/05/2021 1015 Gross per 24 hour  Intake 600 ml  Output 400 ml  Net 200 ml    Last 3 Weights 06/05/2021 06/04/2021 06/04/2021  Weight (lbs) 268 lb 3.2 oz 264 lb 8.8 oz 274 lb 12.8 oz  Weight (kg) 121.655 kg 120 kg 124.648 kg      Telemetry    Not on telemetry.  Recommend restart. Previously NSR, PVCs, NSVT- Personally Reviewed  ECG    No new tracings - Personally Reviewed  Physical Exam   GEN: Anxious appearing, obese.  Sitting on edge of bed. Neck: JVD difficult to assess due to body habitus and position of patient Cardiac:RRR, no murmurs, rubs, or gallops.  Respiratory: Distant/reduced breath sounds bilaterally. GI: Obese, soft, nontender, non-distended  MS: Moderate bilateral edema; No deformity. Neuro:  Nonfocal  Psych: Normal affect   Labs    High Sensitivity Troponin:   Recent Labs  Lab 05/11/21 0825 05/11/21 1205 06/01/21 2000 06/01/21 2153  TROPONINIHS  40* 36* 1,279* 1,127*       Chemistry Recent Labs  Lab 06/01/21 2000 06/02/21 0529 06/03/21 0440 06/04/21 0443 06/05/21 0500  NA 143   < > 139 136 137  K 3.6   < > 3.7 3.6 4.0  CL 106   < > 101 101 103  CO2 28   < > 26 26 27   GLUCOSE 97   < > 120* 109* 112*  BUN 27*   < > 37* 43* 46*  CREATININE 1.40*   < > 1.55* 1.72* 1.76*  CALCIUM 9.1   < > 9.3 9.1 9.0  PROT 7.4  --   --   --   --   ALBUMIN 3.7  --   --   --   --   AST 30  --   --   --   --   ALT 30  --   --   --   --   ALKPHOS 92  --   --   --   --   BILITOT 1.8*  --   --   --   --   GFRNONAA 59*   < > 52* 46* 45*  ANIONGAP 9   < > 12 9 7    < > =  values in this interval not displayed.      Hematology Recent Labs  Lab 06/02/21 0529 06/03/21 0440 06/04/21 0443  WBC 6.3 6.7 6.0  RBC 4.49 4.44 4.60  HGB 13.0 12.6* 12.8*  HCT 38.5* 37.6* 38.9*  MCV 85.7 84.7 84.6  MCH 29.0 28.4 27.8  MCHC 33.8 33.5 32.9  RDW 16.2* 16.1* 16.3*  PLT 269 279 267     BNP Recent Labs  Lab 06/01/21 2109 06/04/21 0443  BNP 4,001.7* 7,989.2*      DDimer No results for input(s): DDIMER in the last 168 hours.   Radiology    No results found.  Cardiac Studies     a. 05/2019 Cath: LM nl, LAD 15p, 19m, LCX large/nl, OM2 30, OM3 20, RCA 100 CTO w/ bridging L->R collats to RPDA.    b. 09/27/18 Echo: EF 20-25%, grade 2 DD c. 03/2019 Echo: EF 20-25%  d. 05/2021 Echo: EF <10%, glob HK. GrII DD. Sev red RV fxn. Sev BAE. Mild MR. Mild-mod TR.  Echo 05/12/21  1. No left ventriculatr thrombus is seen. Left ventricular ejection  fraction, by estimation, is <10%. The left ventricle has severely  decreased function. The left ventricle demonstrates global hypokinesis.  The left ventricular internal cavity size was  severely dilated. Left ventricular diastolic parameters are consistent  with Grade II diastolic dysfunction (pseudonormalization). Elevated left  atrial pressure.   2. Right ventricular systolic function is severely reduced.  The right  ventricular size is severely enlarged. There is mildly elevated pulmonary  artery systolic pressure.   3. Left atrial size was severely dilated.   4. Right atrial size was severely dilated.   5. The mitral valve is normal in structure. Mild mitral valve  regurgitation.   6. Tricuspid valve regurgitation is mild to moderate.   7. The aortic valve is tricuspid. Aortic valve regurgitation is not  visualized. No aortic stenosis is present.   8. The inferior vena cava is dilated in size with <50% respiratory  variability, suggesting right atrial pressure of 15 mmHg.   Patient Profile     60 y.o. male with history of CAD, chronic combined syst/diast CHF, mixed ICM/NICM, HTN, HL, cocaine/etoh abuse, obesity, CKD II, anemia, and noncompliance, who was admitted 7/29 secondary to recurrent CHF in the setting of ongoing noncompliance.  Assessment & Plan    Acute on chronic systolic and diastolic heart failure --Reports ongoing episodes of shortness of breath.  HFrEF with noncompliance. History of systolic dysfunction, dating back to 2019 with multiple admissions 2/2 noncompliance and polysubstance use.  Recent discharge from [facility] for HF exacerbation. Severely reduced EF <10% on 7/29 echo, G2 DD, elevated LAP, severely enlarged RV with mildly elevated PASP, biatrial severe enlargement, mild MR, mild to moderate TR, RAP 15 mmHg. -- Still significantly volume overloaded with net -788cc total from admission. Wt 120kg  121.7kg. Refused IV diuresis.  Transitioned to oral diuresis. Continue torsemide 40 mg twice daily with potassium 20 M EQ daily.  Continue Coreg 6.25 mg twice daily. Continue BiDil.  Escalation of GDMT limited by renal function. No ACE/ARB/ARNI/MRA/SGLT2 inhibitor due to renal function and medication noncompliance.  High risk for readmission. Continue CHF education. Will need medication assistance.   Coronary artery disease/demand ischemia --Denies chest pain.  History of CTO  of RCA 05/2019.  Poor LHC/PCI candidate in the setting of CKD and noncompliance.  HS Tn elevated to 1279.0, now down-trended. S/p 48 hours of IV heparin.  Continue medical therapy with ASA,  Coreg, statin.   NSVT --Not on telemetry.  5 beats of NSVT on 7/31.  Continue beta-blocker.  Given severely reduced EF, recommend restart of telemetry if patient agreeable.  CKDII -- Cr 1.55  1.76 and BUN 37  46. Cr continues to bump overnight. Caution with nephrotoxins.  Consider nephrology consult / OP follow-up.  Anemia of chronic disease  -- Most recent CBCs show stable H&H.  Continue to monitor.  Polysubstance use --UDS negative for cocaine, positive for marijuana.  Complete cessation advised.  Medication noncompliance --Will need social assistance prior to discharge.  For questions or updates, please contact [facility] Please consult [email or web address] for contact info under        Signed, Lennon Alstrom, PA-C  06/05/2021, 11:03 AM

## 2021-06-05 NOTE — Progress Notes (Signed)
Patient continues to refuse placement of intravenous access as well as cardiac monitoring despite education on the purposes of such. Provide aware.

## 2021-06-05 NOTE — Progress Notes (Signed)
PROGRESS NOTE    Dale [name]  MQK:[id number] DOB: 1960/11/07 DOA: 06/01/2021 PCP: Thayer Headings, MD   Brief Narrative: Taken from H&P.  Dale [name] is a 60 y.o. male with medical history significant for hyperlipidemia, hypertension, neuropathy, severe heart failure, history of cocaine use, CKD 3A, presents emergency department for chief concerns of shortness of breath.   He states that he has not been able to take his medications for about 1 month.  He states that the medication are too expensive for him.  He denies any fever, nausea, vomiting, chest pain, abdominal pain, dysuria, hematuria, diarrhea.  Labs in the emergency department was remarkable for sodium 143, potassium 3.6, chloride 106, bicarb 28, BUN 27, serum creatinine 1.40, nonfasting blood glucose 97, EGFR 59, troponin 1279, EKG concerning for some ischemic changes, he was started on heparin infusion.  Multiple recent hospitalizations for similar reasons and being noncompliant.  Patient continued to use drugs, last cocaine use was 2 weeks ago, regularly use marijuana.  History of leaving AMA couple of times, recently admitted at [facility] and discharged in mid July, advised to pick up medications which he never did.  Recent echocardiogram with EF of less than 10%, not a candidate for advanced therapies per cardiology due to being noncompliant.  They do not think that recath and PCI will help as he will not take his antiplatelets regularly.  He was started on aggressive diuresis. Will remain high risk for readmission, deterioration and death. Patient refusing to wear telemetry-follow agreed with cardiology. Cardiology started him on Lasix infusion. Palliative care was also consulted.  Patient pulled his IV today.  He was very fixated that someone stalls his money, refusing to place another IV and telemetry.  Administration involved.  Cardiology temporarily switched him to torsemide.  High risk for leaving AMA,  readmission, deterioration and death.  Subjective: Patient continued to refuse IV and telemetry.  At time refusing p.o. meds.  This morning he was again upset that somebody stole his wallet.  Still short of breath with minor exertion, stating that it comes and goes.  He wants to leave, I told him if he wants to leave he needs to sign AMA, per patient he does not want to sign those papers, I told him to wait for cardiology.  I also discussed that we wanted to arrange medications from medication management clinic as he has to take his diuretics and other medications because of extremely weak heart.  Later called by nursing staff that they cannot find the patient anywhere.??  Assessment & Plan:   Principal Problem:   Acute systolic heart failure (HCC) Active Problems:   Essential hypertension   Obesity (BMI 30-39.9)   Substance abuse ([place])   Anxiety and depression   Acute CHF (congestive heart failure) (HCC)   Acute on chronic combined systolic (congestive) and diastolic (congestive) heart failure (HCC)   CKD (chronic kidney disease), stage III (HCC)  Acute on chronic combined heart failure.  Patient with extremely low EF of less than 10% on echocardiogram done recently.  History of ischemic cardiomyopathy and being noncompliant.  Multiple recent admissions and leaving AMA. Cardiology was consulted-not a good candidate for any advance therapies or interventions due to being noncompliant and other behavioral issues. -IV Lasix was switched with Lasix infusion, currently on torsemide due to losing his IV, patient refusing to place another IV and at times refusing torsemide too. -Continue carvedilol and losartan -Strict intake and output -Daily weight and BMP -Heart  healthy diet with fluid restriction of 1500 mL -Very high risk for readmission, leaving AMA, deterioration and death.  Elevated troponin.  Patient did not had any chest pain but do have some ischemic changes on EKG.  Most likely  secondary to heart strain with severe heart failure exacerbation and being noncompliant.  Cardiology is recommending continuation of heparin infusion for 48 hours. Patient is not a good candidate for cardiac catheterization as if needing PCI he will not be able to take antiplatelets regularly. -Completed 48 hours of heparin infusion. -Currently troponin decreasing. -Continue to monitor  Hyperlipidemia. -Home dose of atorvastatin was resumed  Hypertension. -Continue home losartan.  Objective: Vitals:   06/04/21 2021 06/05/21 0507 06/05/21 0825 06/05/21 0904  BP: 102/73 109/75 112/82   Pulse: 81 83 85   Resp: (!) 22 (!) 26 19   Temp: 98.4 F (36.9 C) 97.7 F (36.5 C) 98 F (36.7 C)   TempSrc:  Oral Oral   SpO2: 100% 100% 100%   Weight:    121.7 kg  Height:        Intake/Output Summary (Last 24 hours) at 06/05/2021 1449 Last data filed at 06/05/2021 1015 Gross per 24 hour  Intake 600 ml  Output 400 ml  Net 200 ml    Filed Weights   06/04/21 0510 06/04/21 0900 06/05/21 0904  Weight: 124.6 kg 120 kg 121.7 kg    Examination:  General.  Well-developed gentleman, in no acute distress. Pulmonary.  Lungs clear bilaterally, normal respiratory effort. CV.  Regular rate and rhythm, no JVD, rub or murmur. Abdomen.  Soft, nontender, nondistended, BS positive. CNS.  Alert and oriented x3.  No focal neurologic deficit. Extremities.  2+ LE edema, no cyanosis, pulses intact and symmetrical. Psychiatry.  Judgment and insight appears normal.   DVT prophylaxis: Heparin Code Status: Full Family Communication: Discussed with patient Disposition Plan:  Status is: Inpatient  Remains inpatient appropriate because:Inpatient level of care appropriate due to severity of illness  Dispo: The patient is from: Home              Anticipated d/c is to: Home              Patient currently is not medically stable to d/c.   Difficult to place patient No              Level of care: Progressive  Cardiac  All the records are reviewed and case discussed with Care Management/Social Worker. Management plans discussed with the patient, nursing and they are in agreement.  Consultants:  Cardiology  Procedures:  Antimicrobials:   Data Reviewed: I have personally reviewed following labs and imaging studies  CBC: Recent Labs  Lab 06/01/21 2109 06/02/21 0529 06/03/21 0440 06/04/21 0443  WBC 6.0 6.3 6.7 6.0  NEUTROABS 4.3  --   --   --   HGB 12.7* 13.0 12.6* 12.8*  HCT 37.6* 38.5* 37.6* 38.9*  MCV 86.8 85.7 84.7 84.6  PLT 277 269 279 726    Basic Metabolic Panel: Recent Labs  Lab 06/01/21 2000 06/02/21 0529 06/03/21 0440 06/04/21 0443 06/05/21 0500  NA 143 138 139 136 137  K 3.6 3.8 3.7 3.6 4.0  CL 106 104 101 101 103  CO2 _0 GLUCOSE 97 130* 120* 109* 112*  BUN 27* 29* 37* 43* 46*  CREATININE 1.40* 1.41* 1.55* 1.72* 1.76*  CALCIUM 9.1 9.0 9.3 9.1 9.0  MG 2.3  --   --   --   --  GFR: Estimated Creatinine Clearance: 61.5 mL/min (A) (by C-G formula based on SCr of 1.76 mg/dL (H)). Liver Function Tests: Recent Labs  Lab 06/01/21 2000  AST 30  ALT 30  ALKPHOS 92  BILITOT 1.8*  PROT 7.4  ALBUMIN 3.7    No results for input(s): LIPASE, AMYLASE in the last 168 hours. No results for input(s): AMMONIA in the last 168 hours. Coagulation Profile: Recent Labs  Lab 06/01/21 2056  INR 1.2    Cardiac Enzymes: No results for input(s): CKTOTAL, CKMB, CKMBINDEX, TROPONINI in the last 168 hours. BNP (last 3 results) No results for input(s): PROBNP in the last 8760 hours. HbA1C: No results for input(s): HGBA1C in the last 72 hours. CBG: No results for input(s): GLUCAP in the last 168 hours. Lipid Profile: No results for input(s): CHOL, HDL, LDLCALC, TRIG, CHOLHDL, LDLDIRECT in the last 72 hours. Thyroid Function Tests: No results for input(s): TSH, T4TOTAL, FREET4, T3FREE, THYROIDAB in the last 72 hours. Anemia Panel: No results for input(s):  VITAMINB12, FOLATE, FERRITIN, TIBC, IRON, RETICCTPCT in the last 72 hours. Sepsis Labs: No results for input(s): PROCALCITON, LATICACIDVEN in the last 168 hours.  Recent Results (from the past 240 hour(s))  Resp Panel by RT-PCR (Flu A&B, Covid) Nasopharyngeal Swab     Status: None   Collection Time: 06/01/21  8:52 PM   Specimen: Nasopharyngeal Swab; Nasopharyngeal(NP) swabs in vial transport medium  Result Value Ref Range Status   SARS Coronavirus 2 by RT PCR NEGATIVE NEGATIVE Final    Comment: (NOTE) SARS-CoV-2 target nucleic acids are NOT DETECTED.  The SARS-CoV-2 RNA is generally detectable in upper respiratory specimens during the acute phase of infection. The lowest concentration of SARS-CoV-2 viral copies this assay can detect is 138 copies/mL. A negative result does not preclude SARS-Cov-2 infection and should not be used as the sole basis for treatment or other patient management decisions. A negative result may occur with  improper specimen collection/handling, submission of specimen other than nasopharyngeal swab, presence of viral mutation(s) within the areas targeted by this assay, and inadequate number of viral copies(<138 copies/mL). A negative result must be combined with clinical observations, patient history, and epidemiological information. The expected result is Negative.  Fact Sheet for Patients:  EntrepreneurPulse.com.au  Fact Sheet for Healthcare Providers:  IncredibleEmployment.be  This test is no t yet approved or cleared by the [place] FDA and  has been authorized for detection and/or diagnosis of SARS-CoV-2 by FDA under an Emergency Use Authorization (EUA). This EUA will remain  in effect (meaning this test can be used) for the duration of the COVID-19 declaration under Section 564(b)(1) of the Act, 21 U.S.C.section 360bbb-3(b)(1), unless the authorization is terminated  or revoked sooner.       Influenza A  by PCR NEGATIVE NEGATIVE Final   Influenza B by PCR NEGATIVE NEGATIVE Final    Comment: (NOTE) The Xpert Xpress SARS-CoV-2/FLU/RSV plus assay is intended as an aid in the diagnosis of influenza from Nasopharyngeal swab specimens and should not be used as a sole basis for treatment. Nasal washings and aspirates are unacceptable for Xpert Xpress SARS-CoV-2/FLU/RSV testing.  Fact Sheet for Patients: EntrepreneurPulse.com.au  Fact Sheet for Healthcare Providers: IncredibleEmployment.be  This test is not yet approved or cleared by the [place] FDA and has been authorized for detection and/or diagnosis of SARS-CoV-2 by FDA under an Emergency Use Authorization (EUA). This EUA will remain in effect (meaning this test can be used) for the duration of the COVID-19 declaration  under Section 564(b)(1) of the Act, 21 U.S.C. section 360bbb-3(b)(1), unless the authorization is terminated or revoked.  Performed at [facility], [address] [place], [place] [address]       Radiology Studies: No results found.  Scheduled Meds:  aspirin EC  81 mg Oral Daily   atorvastatin  40 mg Oral Daily   carvedilol  6.25 mg Oral BID WC   enoxaparin (LOVENOX) injection  0.5 mg/kg Subcutaneous Q24H   isosorbide-hydrALAZINE  1 tablet Oral TID   potassium chloride SA  20 mEq Oral Daily   torsemide  40 mg Oral BID   Continuous Infusions:     LOS: 3 days   Time spent: 33 minutes. More than 50% of the time was spent in counseling/coordination of care  Lorella Nimrod, MD [facility]  If 7PM-7AM, please contact night-coverage [email or web address]  06/05/2021, 2:49 PM   This record has been created using Systems analyst. Errors have been sought and corrected,but may not always be located. Such creation errors do not reflect on the standard of care.

## 2021-06-05 NOTE — Progress Notes (Signed)
Attempted to follow up with patient, he was not present in room. Asked staff which were not aware of patient being in testing or being discharged.

## 2021-06-05 NOTE — Progress Notes (Signed)
Member of chaplain alerted this RN that they made an attempt to visit the patient and he was not in his room. This RN went to check in and confirm location of patient, only to find an empty room. Patient left against medical advice, neglecting to make staff aware. Dr. Nelson Chimes made aware. Charge RN Lillia Abed and Freight forwarder made aware.

## 2021-06-05 NOTE — Progress Notes (Signed)
This RN went into patient's room to obtain 1200 vital signs. Patient refused. Told this RN to "leave".

## 2021-06-09 NOTE — Discharge Summary (Signed)
Patient left without telling anyone, AGAINST MEDICAL ADVICE  Please see last day progress note  as discharge summary.

## 2021-06-13 ENCOUNTER — Inpatient Hospital Stay (HOSPITAL_COMMUNITY)
Admission: EM | Admit: 2021-06-13 | Discharge: 2021-06-21 | DRG: 291 | Disposition: A | Discharge status: Home / Self Care | Payer: Medicaid Other | Attending: Internal Medicine | Admitting: Internal Medicine

## 2021-06-13 ENCOUNTER — Emergency Department (HOSPITAL_COMMUNITY): Payer: Medicaid Other

## 2021-06-13 DIAGNOSIS — Z7982 Long term (current) use of aspirin: Secondary | ICD-10-CM | POA: Diagnosis not present

## 2021-06-13 DIAGNOSIS — R778 Other specified abnormalities of plasma proteins: Secondary | ICD-10-CM

## 2021-06-13 DIAGNOSIS — Z8249 Family history of ischemic heart disease and other diseases of the circulatory system: Secondary | ICD-10-CM

## 2021-06-13 DIAGNOSIS — I255 Ischemic cardiomyopathy: Secondary | ICD-10-CM | POA: Diagnosis present

## 2021-06-13 DIAGNOSIS — E669 Obesity, unspecified: Secondary | ICD-10-CM

## 2021-06-13 DIAGNOSIS — F1410 Cocaine abuse, uncomplicated: Secondary | ICD-10-CM | POA: Diagnosis present

## 2021-06-13 DIAGNOSIS — Z9114 Patient's other noncompliance with medication regimen: Secondary | ICD-10-CM | POA: Diagnosis not present

## 2021-06-13 DIAGNOSIS — R45851 Suicidal ideations: Secondary | ICD-10-CM | POA: Diagnosis present

## 2021-06-13 DIAGNOSIS — I428 Other cardiomyopathies: Secondary | ICD-10-CM | POA: Diagnosis present

## 2021-06-13 DIAGNOSIS — I5043 Acute on chronic combined systolic (congestive) and diastolic (congestive) heart failure: Secondary | ICD-10-CM | POA: Diagnosis present

## 2021-06-13 DIAGNOSIS — Z20822 Contact with and (suspected) exposure to covid-19: Secondary | ICD-10-CM | POA: Diagnosis present

## 2021-06-13 DIAGNOSIS — I5040 Unspecified combined systolic (congestive) and diastolic (congestive) heart failure: Secondary | ICD-10-CM | POA: Diagnosis present

## 2021-06-13 DIAGNOSIS — Z66 Do not resuscitate: Secondary | ICD-10-CM | POA: Diagnosis present

## 2021-06-13 DIAGNOSIS — I509 Heart failure, unspecified: Secondary | ICD-10-CM | POA: Insufficient documentation

## 2021-06-13 DIAGNOSIS — E785 Hyperlipidemia, unspecified: Secondary | ICD-10-CM | POA: Diagnosis present

## 2021-06-13 DIAGNOSIS — Z9119 Patient's noncompliance with other medical treatment and regimen: Secondary | ICD-10-CM | POA: Diagnosis not present

## 2021-06-13 DIAGNOSIS — Z6838 Body mass index (BMI) 38.0-38.9, adult: Secondary | ICD-10-CM | POA: Diagnosis not present

## 2021-06-13 DIAGNOSIS — N1832 Chronic kidney disease, stage 3b: Secondary | ICD-10-CM | POA: Diagnosis present

## 2021-06-13 DIAGNOSIS — E876 Hypokalemia: Secondary | ICD-10-CM | POA: Diagnosis present

## 2021-06-13 DIAGNOSIS — I2510 Atherosclerotic heart disease of native coronary artery without angina pectoris: Secondary | ICD-10-CM | POA: Diagnosis present

## 2021-06-13 DIAGNOSIS — N179 Acute kidney failure, unspecified: Secondary | ICD-10-CM

## 2021-06-13 DIAGNOSIS — I5020 Unspecified systolic (congestive) heart failure: Secondary | ICD-10-CM

## 2021-06-13 DIAGNOSIS — I4729 Other ventricular tachycardia: Secondary | ICD-10-CM

## 2021-06-13 DIAGNOSIS — Z5900 Homelessness unspecified: Secondary | ICD-10-CM

## 2021-06-13 DIAGNOSIS — I10 Essential (primary) hypertension: Secondary | ICD-10-CM | POA: Diagnosis not present

## 2021-06-13 DIAGNOSIS — I130 Hypertensive heart and chronic kidney disease with heart failure and stage 1 through stage 4 chronic kidney disease, or unspecified chronic kidney disease: Secondary | ICD-10-CM | POA: Diagnosis present

## 2021-06-13 DIAGNOSIS — Z79899 Other long term (current) drug therapy: Secondary | ICD-10-CM | POA: Diagnosis not present

## 2021-06-13 DIAGNOSIS — G4700 Insomnia, unspecified: Secondary | ICD-10-CM | POA: Diagnosis present

## 2021-06-13 DIAGNOSIS — I5023 Acute on chronic systolic (congestive) heart failure: Secondary | ICD-10-CM | POA: Insufficient documentation

## 2021-06-13 DIAGNOSIS — I472 Ventricular tachycardia: Secondary | ICD-10-CM | POA: Diagnosis present

## 2021-06-13 DIAGNOSIS — R9431 Abnormal electrocardiogram [ECG] [EKG]: Secondary | ICD-10-CM | POA: Diagnosis present

## 2021-06-13 DIAGNOSIS — N189 Chronic kidney disease, unspecified: Secondary | ICD-10-CM | POA: Diagnosis not present

## 2021-06-13 DIAGNOSIS — Z515 Encounter for palliative care: Secondary | ICD-10-CM

## 2021-06-13 DIAGNOSIS — D631 Anemia in chronic kidney disease: Secondary | ICD-10-CM | POA: Diagnosis present

## 2021-06-13 DIAGNOSIS — F4323 Adjustment disorder with mixed anxiety and depressed mood: Secondary | ICD-10-CM | POA: Diagnosis not present

## 2021-06-13 DIAGNOSIS — F1210 Cannabis abuse, uncomplicated: Secondary | ICD-10-CM | POA: Diagnosis present

## 2021-06-13 DIAGNOSIS — I440 Atrioventricular block, first degree: Secondary | ICD-10-CM | POA: Diagnosis present

## 2021-06-13 DIAGNOSIS — D649 Anemia, unspecified: Secondary | ICD-10-CM | POA: Diagnosis not present

## 2021-06-13 DIAGNOSIS — R0602 Shortness of breath: Secondary | ICD-10-CM | POA: Diagnosis present

## 2021-06-13 LAB — BASIC METABOLIC PANEL
Anion gap: 11 (ref 5–15)
BUN: 49 mg/dL — ABNORMAL HIGH (ref 6–20)
CO2: 21 mmol/L — ABNORMAL LOW (ref 22–32)
Calcium: 9 mg/dL (ref 8.9–10.3)
Chloride: 105 mmol/L (ref 98–111)
Creatinine, Ser: 1.88 mg/dL — ABNORMAL HIGH (ref 0.61–1.24)
GFR, Estimated: 41 mL/min — ABNORMAL LOW (ref >60)
Glucose, Bld: 108 mg/dL — ABNORMAL HIGH (ref 70–99)
Potassium: 3.7 mmol/L (ref 3.5–5.1)
Sodium: 137 mmol/L (ref 135–145)

## 2021-06-13 LAB — RAPID URINE DRUG SCREEN, HOSP PERFORMED
Amphetamines: NOT DETECTED
Barbiturates: NOT DETECTED
Benzodiazepines: NOT DETECTED
Cocaine: NOT DETECTED
Opiates: NOT DETECTED
Tetrahydrocannabinol: POSITIVE — AB

## 2021-06-13 LAB — CBC
HCT: 38.9 % — ABNORMAL LOW (ref 39.0–52.0)
Hemoglobin: 12.8 g/dL — ABNORMAL LOW (ref 13.0–17.0)
MCH: 28.1 pg (ref 26.0–34.0)
MCHC: 32.9 g/dL (ref 30.0–36.0)
MCV: 85.3 fL (ref 80.0–100.0)
Platelets: 274 10*3/uL (ref 150–400)
RBC: 4.56 MIL/uL (ref 4.22–5.81)
RDW: 16.7 % — ABNORMAL HIGH (ref 11.5–15.5)
WBC: 6.2 10*3/uL (ref 4.0–10.5)
nRBC: 0 % (ref 0.0–0.2)

## 2021-06-13 LAB — BRAIN NATRIURETIC PEPTIDE: B Natriuretic Peptide: 2895.8 pg/mL — ABNORMAL HIGH (ref 0.0–100.0)

## 2021-06-13 LAB — TROPONIN I (HIGH SENSITIVITY)
Troponin I (High Sensitivity): 28 ng/L — ABNORMAL HIGH (ref <18)
Troponin I (High Sensitivity): 26 ng/L — ABNORMAL HIGH (ref <18)

## 2021-06-13 LAB — TSH: TSH: 1.541 u[IU]/mL (ref 0.350–4.500)

## 2021-06-13 LAB — SARS CORONAVIRUS 2 (TAT 6-24 HRS): SARS Coronavirus 2: NEGATIVE

## 2021-06-13 MED ORDER — LOSARTAN POTASSIUM 25 MG PO TABS
12.5000 mg | ORAL_TABLET | Freq: Once | ORAL | Status: AC
  Administered 2021-06-13: 12.5 mg via ORAL
  Filled 2021-06-13: qty 0.5

## 2021-06-13 MED ORDER — ONDANSETRON HCL 4 MG/2ML IJ SOLN
4.0000 mg | Freq: Four times a day (QID) | INTRAMUSCULAR | Status: DC | PRN
  Administered 2021-06-18: 4 mg via INTRAVENOUS
  Filled 2021-06-13: qty 2

## 2021-06-13 MED ORDER — SODIUM CHLORIDE 0.9 % IV SOLN: 250.0000 mL | INTRAVENOUS | Status: DC | PRN

## 2021-06-13 MED ORDER — ASPIRIN EC 81 MG PO TBEC
81.0000 mg | DELAYED_RELEASE_TABLET | Freq: Every day | ORAL | Status: DC
  Administered 2021-06-13 – 2021-06-21 (×9): 81 mg via ORAL
  Filled 2021-06-13 (×9): qty 1

## 2021-06-13 MED ORDER — ENOXAPARIN SODIUM 60 MG/0.6ML IJ SOSY
60.0000 mg | PREFILLED_SYRINGE | INTRAMUSCULAR | Status: DC
  Filled 2021-06-13 (×7): qty 0.6

## 2021-06-13 MED ORDER — MELATONIN 5 MG PO TABS
5.0000 mg | ORAL_TABLET | Freq: Every evening | ORAL | Status: DC | PRN
  Administered 2021-06-13: 5 mg via ORAL
  Filled 2021-06-13: qty 1

## 2021-06-13 MED ORDER — DICLOFENAC SODIUM 1 % TD GEL
2.0000 g | Freq: Four times a day (QID) | TRANSDERMAL | Status: DC | PRN
  Filled 2021-06-13: qty 100

## 2021-06-13 MED ORDER — LOSARTAN POTASSIUM 25 MG PO TABS: 12.5000 mg | ORAL_TABLET | Freq: Every day | ORAL | Status: DC

## 2021-06-13 MED ORDER — CARVEDILOL 3.125 MG PO TABS
6.2500 mg | ORAL_TABLET | Freq: Once | ORAL | Status: AC
  Administered 2021-06-13: 6.25 mg via ORAL
  Filled 2021-06-13: qty 2

## 2021-06-13 MED ORDER — GABAPENTIN 300 MG PO CAPS
300.0000 mg | ORAL_CAPSULE | Freq: Every day | ORAL | Status: DC
  Administered 2021-06-13 – 2021-06-20 (×8): 300 mg via ORAL
  Filled 2021-06-13 (×8): qty 1

## 2021-06-13 MED ORDER — ATORVASTATIN CALCIUM 40 MG PO TABS
40.0000 mg | ORAL_TABLET | Freq: Every day | ORAL | Status: DC
  Administered 2021-06-13 – 2021-06-21 (×9): 40 mg via ORAL
  Filled 2021-06-13 (×9): qty 1

## 2021-06-13 MED ORDER — LOSARTAN POTASSIUM 25 MG PO TABS
12.5000 mg | ORAL_TABLET | Freq: Every day | ORAL | Status: DC
  Administered 2021-06-14 – 2021-06-15 (×2): 12.5 mg via ORAL
  Filled 2021-06-13 (×2): qty 1

## 2021-06-13 MED ORDER — FUROSEMIDE 10 MG/ML IJ SOLN: 40.0000 mg | Freq: Once | INTRAMUSCULAR | Status: DC

## 2021-06-13 MED ORDER — SODIUM CHLORIDE 0.9% FLUSH
3.0000 mL | Freq: Two times a day (BID) | INTRAVENOUS | Status: DC
  Administered 2021-06-13 – 2021-06-21 (×17): 3 mL via INTRAVENOUS

## 2021-06-13 MED ORDER — FUROSEMIDE 10 MG/ML IJ SOLN
80.0000 mg | Freq: Once | INTRAMUSCULAR | Status: AC
  Administered 2021-06-13: 80 mg via INTRAVENOUS
  Filled 2021-06-13: qty 8

## 2021-06-13 MED ORDER — POTASSIUM CHLORIDE CRYS ER 20 MEQ PO TBCR
20.0000 meq | EXTENDED_RELEASE_TABLET | Freq: Every day | ORAL | Status: DC
  Administered 2021-06-13 – 2021-06-21 (×9): 20 meq via ORAL
  Filled 2021-06-13 (×6): qty 1
  Filled 2021-06-13: qty 2
  Filled 2021-06-13 (×2): qty 1

## 2021-06-13 MED ORDER — FUROSEMIDE 10 MG/ML IJ SOLN
80.0000 mg | Freq: Two times a day (BID) | INTRAMUSCULAR | Status: DC
  Administered 2021-06-13 – 2021-06-15 (×4): 80 mg via INTRAVENOUS
  Filled 2021-06-13 (×4): qty 8

## 2021-06-13 MED ORDER — SODIUM CHLORIDE 0.9% FLUSH: 3.0000 mL | INTRAVENOUS | Status: DC | PRN

## 2021-06-13 MED ORDER — ACETAMINOPHEN 325 MG PO TABS: 650.0000 mg | ORAL_TABLET | ORAL | Status: DC | PRN

## 2021-06-13 NOTE — ED Provider Notes (Signed)
[facility] EMERGENCY DEPARTMENT Provider Note   CSN: [id number] Arrival date & time: 06/13/21  0030     History Chief Complaint  Patient presents with   Shortness of Breath    Dale [name] is a 60 y.o. male.  Patient complains of shortness of breath and swelling in his legs.  Patient has a history of congestive heart failure cardiomyopathy and poor compliance  The history is provided by the patient. No language interpreter was used.  Shortness of Breath Severity:  Moderate Onset quality:  Sudden Timing:  Constant Progression:  Waxing and waning Chronicity:  Recurrent Context: activity   Relieved by:  Nothing Worsened by:  Nothing Ineffective treatments:  None tried Associated symptoms: no abdominal pain, no chest pain, no cough, no headaches and no rash       Past Medical History:  Diagnosis Date   Alcohol abuse    CAD (coronary artery disease)    a. 05/2019 Cath: LM nl, LAD 15p, 56m, LCX large/nl, OM2 30, OM3 20, RCA 100 CTO w/ bridging L->R collats to RPDA.   Chronic combined systolic and diastolic CHF (congestive heart failure) (HCC)    a. 09/27/18 Echo: EF 20-25%, grade 2 DD; b. 03/2019 Echo: EF 20-25%; c. 05/2021 Echo: EF <10%, glob HK. GrII DD. Sev red RV fxn. Sev BAE. Mild MR. Mild-mod TR.   CKD (chronic kidney disease), stage II    Cocaine abuse (HCC)    Hyperlipidemia LDL goal <70    Hypertension    Ischemic cardiomyopathy    a. 09/27/18 Echo: EF 20-25%, grade 2 DD; b. 03/2019 Echo: EF 20-25%; c. 05/2019 Cath: RCA 100 CTA, otw nonobs dzs; d. 05/2021 Echo: EF <10%, glob HK. GrII DD. Sev red RV fxn.   Morbid obesity (HCC)    Noncompliance    Normocytic anemia     Patient Active Problem List   Diagnosis Date Noted   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) 06/13/2021   CKD (chronic kidney disease), stage III (HCC) 06/01/2021   Hypokalemia    Acute on chronic HFrEF (heart failure with reduced  ejection fraction) (HCC) 12/25/2020   Coronary artery disease involving native coronary artery of native heart without angina pectoris    Acute kidney injury superimposed on CKD (HCC) 10/16/2020   Acute on chronic combined systolic and diastolic HF (heart failure) (HCC) 09/08/2020   Acute on chronic combined systolic (congestive) and diastolic (congestive) heart failure (HCC)    Acute CHF (congestive heart failure) (HCC) 05/07/2019   Nausea & vomiting 05/07/2019   Acute exacerbation of CHF (congestive heart failure) (HCC) 03/11/2019   Troponin level elevated 03/11/2019   Hyperlipidemia 03/03/2019   NSVT (nonsustained ventricular tachycardia) (HCC) 03/03/2019   Anxiety and depression    NSTEMI (non-ST elevated myocardial infarction) (HCC) 02/28/2019   Acute systolic heart failure (HCC) 11/08/2018   Hypertensive urgency 09/28/2018   Acute pulmonary edema (HCC) 09/28/2018   Cocaine abuse (HCC) 09/28/2018   Substance abuse (HCC) 09/28/2018   Chronic kidney disease, stage II (mild) 09/28/2018   Normochromic anemia 09/28/2018   Acute systolic CHF (congestive heart failure) (HCC) 09/27/2018   Essential hypertension 03/11/2016   Obesity (BMI 30-39.9) 03/11/2016   Glucosuria 03/11/2016    Past Surgical History:  Procedure Laterality Date   RIGHT/LEFT HEART CATH AND CORONARY ANGIOGRAPHY N/A 05/10/2019   Procedure: RIGHT/LEFT HEART CATH AND CORONARY ANGIOGRAPHY;  Surgeon: Yvonne Kendall, MD;  Location: [facility] INVASIVE CV LAB;  Service: Cardiovascular;  Laterality: N/A;       Family History  Problem Relation Age of Onset   Hypertension Maternal Grandmother     Social History   Tobacco Use   Smoking status: Never   Smokeless tobacco: Never  Vaping Use   Vaping Use: Never used  Substance Use Topics   Alcohol use: Yes    Comment: 2 quarts a week    Drug use: Yes    Frequency: 3.0 times per week    Types: Marijuana, Cocaine    Home Medications Prior to Admission medications    Medication Sig Start Date End Date Taking? Authorizing Provider  aspirin EC 81 MG EC tablet Take 1 tablet (81 mg total) by mouth daily. Swallow whole. 12/17/20  Yes Arnetha Courser, MD  carvedilol (COREG) 6.25 MG tablet Take 1 tablet (6.25 mg total) by mouth 2 (two) times daily with a meal. 05/17/21 06/16/21 Yes Kc, Dayna Barker, MD  ferrous sulfate 325 (65 FE) MG EC tablet Take 325 mg by mouth daily with breakfast.   Yes [provider]  gabapentin (NEURONTIN) 300 MG capsule Take 1 capsule (300 mg total) by mouth at bedtime. 10/21/20  Yes Noralee Stain, DO  losartan (COZAAR) 25 MG tablet Take 0.5 tablets (12.5 mg total) by mouth daily. 05/17/21  Yes Kc, Dayna Barker, MD  potassium chloride SA (KLOR-CON) 20 MEQ tablet Take 20 mEq by mouth daily.   Yes [provider]  Torsemide 40 MG TABS Take 40 mg by mouth daily at 6 (six) AM. 05/17/21 06/16/21 Yes Kc, Dayna Barker, MD  atorvastatin (LIPITOR) 40 MG tablet Take 1 tablet (40 mg total) by mouth daily. 05/17/21 06/16/21  Lanae Boast, MD  diclofenac sodium (VOLTAREN) 1 % GEL Apply 2 g topically 4 (four) times daily as needed (pain). Patient not taking: No sig reported    [provider]    Allergies    Hydrocodone  Review of Systems   Review of Systems  Constitutional:  Negative for appetite change and fatigue.  HENT:  Negative for congestion, ear discharge and sinus pressure.   Eyes:  Negative for discharge.  Respiratory:  Positive for shortness of breath. Negative for cough.   Cardiovascular:  Negative for chest pain.  Gastrointestinal:  Negative for abdominal pain and diarrhea.  Genitourinary:  Negative for frequency and hematuria.  Musculoskeletal:  Negative for back pain.       Leg edema  Skin:  Negative for rash.  Neurological:  Negative for seizures and headaches.  Psychiatric/Behavioral:  Negative for hallucinations.    Physical Exam Updated Vital Signs BP (!) 134/116   Pulse 71   Resp (!) 24   SpO2 100%   Physical  Exam Vitals and nursing note reviewed.  Constitutional:      Appearance: He is well-developed.  HENT:     Head: Normocephalic.     Nose: Nose normal.  Eyes:     General: No scleral icterus.    Conjunctiva/sclera: Conjunctivae normal.  Neck:     Thyroid: No thyromegaly.  Cardiovascular:     Rate and Rhythm: Normal rate and regular rhythm.     Heart sounds: No murmur heard.   No friction rub. No gallop.  Pulmonary:     Breath sounds: No stridor. No wheezing or rales.  Chest:     Chest wall: No tenderness.  Abdominal:     General: There is no distension.     Tenderness: There is no abdominal tenderness. There is no rebound.  Musculoskeletal:  General: Normal range of motion.     Cervical back: Neck supple.     Comments: 3+ edema both legs  Lymphadenopathy:     Cervical: No cervical adenopathy.  Skin:    Findings: No erythema or rash.  Neurological:     Mental Status: He is alert and oriented to person, place, and time.     Motor: No abnormal muscle tone.     Coordination: Coordination normal.  Psychiatric:        Behavior: Behavior normal.    ED Results / Procedures / Treatments   Labs (all labs ordered are listed, but only abnormal results are displayed) Labs Reviewed  BASIC METABOLIC PANEL - Abnormal; Notable for the following components:      Result Value   CO2 21 (*)    Glucose, Bld 108 (*)    BUN 49 (*)    Creatinine, Ser 1.88 (*)    GFR, Estimated 41 (*)    All other components within normal limits  CBC - Abnormal; Notable for the following components:   Hemoglobin 12.8 (*)    HCT 38.9 (*)    RDW 16.7 (*)    All other components within normal limits  BRAIN NATRIURETIC PEPTIDE - Abnormal; Notable for the following components:   B Natriuretic Peptide 2,895.8 (*)    All other components within normal limits  RAPID URINE DRUG SCREEN, HOSP PERFORMED - Abnormal; Notable for the following components:   Tetrahydrocannabinol POSITIVE (*)    All other  components within normal limits  TROPONIN I (HIGH SENSITIVITY) - Abnormal; Notable for the following components:   Troponin I (High Sensitivity) 28 (*)    All other components within normal limits  TROPONIN I (HIGH SENSITIVITY) - Abnormal; Notable for the following components:   Troponin I (High Sensitivity) 26 (*)    All other components within normal limits  TSH    EKG EKG Interpretation  Date/Time:  Wednesday June 13 2021 00:36:32 EDT Ventricular Rate:  71 PR Interval:  250 QRS Duration: 100 QT Interval:  474 QTC Calculation: 515 R Axis:   -36 Text Interpretation: Sinus rhythm with 1st degree A-V block with occasional Premature ventricular complexes Left axis deviation Anterior infarct , age undetermined T wave abnormality, consider lateral ischemia Prolonged QT Abnormal ECG Confirmed by Bethann Berkshire [id number]) on 06/13/2021 8:54:25 AM  Radiology DG Chest 2 View  Result Date: 06/13/2021 CLINICAL DATA:  Shortness of breath EXAM: CHEST - 2 VIEW COMPARISON:  06/01/2021 FINDINGS: Cardiac shadow is enlarged but stable. The lungs are well aerated bilaterally. No focal infiltrate or effusion is seen. No bony abnormality is noted. IMPRESSION: No active cardiopulmonary disease. Electronically Signed   By: Alcide Clever M.D.   On: 06/13/2021 01:11    Procedures Procedures   Medications Ordered in ED Medications  sodium chloride flush (NS) 0.9 % injection 3 mL (has no administration in time range)  sodium chloride flush (NS) 0.9 % injection 3 mL (has no administration in time range)  0.9 %  sodium chloride infusion (has no administration in time range)  acetaminophen (TYLENOL) tablet 650 mg (has no administration in time range)  ondansetron (ZOFRAN) injection 4 mg (has no administration in time range)  enoxaparin (LOVENOX) injection 60 mg (has no administration in time range)  furosemide (LASIX) injection 80 mg (has no administration in time range)  furosemide (LASIX) injection 80 mg  (80 mg Intravenous Given 06/13/21 0918)  carvedilol (COREG) tablet 6.25 mg (6.25 mg Oral Given 06/13/21 1057)  losartan (COZAAR) tablet 12.5 mg (12.5 mg Oral Given 06/13/21 1058)    ED Course  I have reviewed the triage vital signs and the nursing notes.  Pertinent labs & imaging results that were available during my care of the patient were reviewed by me and considered in my medical decision making (see chart for details). CRITICAL CARE Performed by: Bethann Berkshire Total critical care time: 45 minutes Critical care time was exclusive of separately billable procedures and treating other patients. Critical care was necessary to treat or prevent imminent or life-threatening deterioration. Critical care was time spent personally by me on the following activities: development of treatment plan with patient and/or surrogate as well as nursing, discussions with consultants, evaluation of patient's response to treatment, examination of patient, obtaining history from patient or surrogate, ordering and performing treatments and interventions, ordering and review of laboratory studies, ordering and review of radiographic studies, pulse oximetry and re-evaluation of patient's condition.  Patient in congestive heart failure.  I spoke with cardiology and they will come consult and medicine will admit MDM Rules/Calculators/A&P                           Patient with anasarca and congestive heart failure poor compliance.  Patient given some Lasix and will be admitted to medicine with cardiology consult Final Clinical Impression(s) / ED Diagnoses Final diagnoses:  Systolic congestive heart failure, unspecified HF chronicity (HCC)    Rx / DC Orders ED Discharge Orders     None        Bethann Berkshire, MD 06/13/21 1115

## 2021-06-13 NOTE — ED Triage Notes (Addendum)
Pt c/o SHOB x52mos. Hx CHF, reported noncompliant w medication (lasix) for same, pt states last dose 2 days ago. Increased swelling (+2) BLE, seen recently for same. RA sats 96%, placed on 2L [place] for comfort, O2 98%. Pt able to speak in full sentences, NAD noted in triage

## 2021-06-13 NOTE — ED Provider Notes (Signed)
Emergency Medicine Provider Triage Evaluation Note  Dale [name] , a 60 y.o. male  was evaluated in triage.  Pt complains of shortness of breath for months, that worsened today.  Reports that he has not eaten all day because he feels as if he cannot breathe.  He reports worsening swelling in his legs.  Reports that he has been out of his home furosemide for the last 2 days.  Has a history of cocaine use, but last use was in April.  He is also endorsing left-sided chest pain.  He is requesting to have something to drink.  Of note, patient left AMA from his most recent hospitalization on August 2.  Review of Systems  Positive: Chest pain, shortness of breath, leg swelling Negative: Vomiting, fever, rash  Physical Exam  BP (!) 123/97 (BP Location: Left Arm)   Pulse 80   Resp 18   SpO2 92%  Gen:   Awake, no distress   Resp:  Normal effort  MSK:   Moves extremities without difficulty  Other:  Pitting edema.  Mild tachypnea.  No retractions.  Medical Decision Making  Medically screening exam initiated at 12:50 AM.  Appropriate orders placed.  Dale [name] was informed that the remainder of the evaluation will be completed by another provider, this initial triage assessment does not replace that evaluation, and the importance of remaining in the ED until their evaluation is complete.  Patient's work-up has been initiated in the emergency department.  He will require further work-up and evaluation in the ED.   Frederik Pear A, PA-C 06/13/21 0054    Gilda Crease, MD 06/13/21 [phone]

## 2021-06-13 NOTE — ED Notes (Signed)
Social work at bedside.  

## 2021-06-13 NOTE — ED Notes (Signed)
Oxygen tank replaced °

## 2021-06-13 NOTE — Progress Notes (Signed)
Patient is requesting substance abuse resources. Patient stated he was in rehab before and relapsed. CSW will provide inpatient resources for patient to review.

## 2021-06-13 NOTE — Progress Notes (Signed)
Room is still dirty

## 2021-06-13 NOTE — H&P (Signed)
History and Physical    Dale [name] OFB:[id number] DOB: 07-01-1961 DOA: 06/13/2021  Referring MD/NP/PA: Bethann Berkshire, MD PCP: Vesta Mixer, MD  Patient coming from: Home   Chief Complaint: Shortness of breath and leg swelling I have personally briefly reviewed patient's old medical records in [facility]   HPI: Dale [name] is a 60 y.o. male with medical history hypertension, hyperlipidemia, NICM/ICM, combined systolic and diastolic CHF, CKD stage IIIa, cocaine use, and history of VT who presents with complaints of shortness of breath and leg swelling.  Patient states that he ran out of his medications of torsemide  2-3 days ago.  He states that he has been unable to lay down due to feeling as though he is gasping for air.  Patient reports that he can not even walk 10 feet without being short of breath.  He has had severe leg swelling to the point in which she is having sharp pains in his legs and feet.  Patient admits to last using cocaine approximately 2 weeks ago.  Records note patient has been admitted to the hospital 3 times in the month of July for exacerbations of CHF.  ED Course: On admission to the emergency department patient was noted to be tachypneic with blood pressures elevated up to 134/116, and O2 saturations currently maintained on 2 L nasal cannula oxygen.  Labs significant for creatinine 1.88, BUN 49, BNP 2895.8, and high-sensitivity troponin 28->26.  Chest x-ray noted no acute abnormality.  Cardiology had been formally consulted.  TRH called to admit.  Patient has been given furosemide 80 mg IV, 6.25 mg p.o., and losartan 12.5 p.o.  TRH called to admit.  Review of Systems  Constitutional:  Positive for malaise/fatigue. Negative for fever.  HENT:  Negative for ear discharge and ear pain.   Eyes:  Negative for pain.  Respiratory:  Positive for cough, shortness of breath and wheezing.   Cardiovascular:  Positive for chest pain, orthopnea and leg  swelling.  Gastrointestinal:  Negative for blood in stool, nausea and vomiting.  Musculoskeletal:  Positive for joint pain and myalgias.  Skin:  Negative for rash.  Neurological:  Negative for focal weakness and loss of consciousness.  Psychiatric/Behavioral:  Positive for substance abuse.    Past Medical History:  Diagnosis Date   Alcohol abuse    CAD (coronary artery disease)    a. 05/2019 Cath: LM nl, LAD 15p, 62m, LCX large/nl, OM2 30, OM3 20, RCA 100 CTO w/ bridging L->R collats to RPDA.   Chronic combined systolic and diastolic CHF (congestive heart failure) (HCC)    a. 09/27/18 Echo: EF 20-25%, grade 2 DD; b. 03/2019 Echo: EF 20-25%; c. 05/2021 Echo: EF <10%, glob HK. GrII DD. Sev red RV fxn. Sev BAE. Mild MR. Mild-mod TR.   CKD (chronic kidney disease), stage II    Cocaine abuse (HCC)    Hyperlipidemia LDL goal <70    Hypertension    Ischemic cardiomyopathy    a. 09/27/18 Echo: EF 20-25%, grade 2 DD; b. 03/2019 Echo: EF 20-25%; c. 05/2019 Cath: RCA 100 CTA, otw nonobs dzs; d. 05/2021 Echo: EF <10%, glob HK. GrII DD. Sev red RV fxn.   Morbid obesity (HCC)    Noncompliance    Normocytic anemia     Past Surgical History:  Procedure Laterality Date   RIGHT/LEFT HEART CATH AND CORONARY ANGIOGRAPHY N/A 05/10/2019   Procedure: RIGHT/LEFT HEART CATH AND CORONARY ANGIOGRAPHY;  Surgeon: Yvonne Kendall, MD;  Location: [facility] INVASIVE  CV LAB;  Service: Cardiovascular;  Laterality: N/A;     reports that he has never smoked. He has never used smokeless tobacco. He reports current alcohol use. He reports current drug use. Frequency: 3.00 times per week. Drugs: Marijuana and Cocaine.  Allergies  Allergen Reactions   Hydrocodone Itching    Family History  Problem Relation Age of Onset   Hypertension Maternal Grandmother     Prior to Admission medications   Medication Sig Start Date End Date Taking? Authorizing Provider  aspirin EC 81 MG EC tablet Take 1 tablet (81 mg total) by mouth daily.  Swallow whole. Patient not taking: Reported on 06/01/2021 12/17/20   Arnetha Courser, MD  atorvastatin (LIPITOR) 40 MG tablet Take 1 tablet (40 mg total) by mouth daily. Patient not taking: Reported on 06/01/2021 05/17/21 06/16/21  Lanae Boast, MD  carvedilol (COREG) 6.25 MG tablet Take 1 tablet (6.25 mg total) by mouth 2 (two) times daily with a meal. Patient not taking: Reported on 06/01/2021 05/17/21 06/16/21  Lanae Boast, MD  diclofenac sodium (VOLTAREN) 1 % GEL Apply 2 g topically 4 (four) times daily as needed (pain). Patient not taking: Reported on 06/01/2021    [provider]  ferrous sulfate 325 (65 FE) MG EC tablet Take 325 mg by mouth daily with breakfast. Patient not taking: Reported on 06/01/2021    [provider]  gabapentin (NEURONTIN) 300 MG capsule Take 1 capsule (300 mg total) by mouth at bedtime. Patient not taking: No sig reported 10/21/20   Noralee Stain, DO  losartan (COZAAR) 25 MG tablet Take 0.5 tablets (12.5 mg total) by mouth daily. Patient not taking: Reported on 06/01/2021 05/17/21   Lanae Boast, MD  potassium chloride SA (KLOR-CON) 20 MEQ tablet Take 20 mEq by mouth daily.    [provider]  Torsemide 40 MG TABS Take 40 mg by mouth daily at 6 (six) AM. Patient not taking: Reported on 06/01/2021 05/17/21 06/16/21  Lanae Boast, MD    Physical Exam:  Constitutional: Obese middle-age male who appears to be in discomfort sitting at side of the bed Vitals:   06/13/21 0633 06/13/21 0715 06/13/21 0900 06/13/21 1000  BP: (!) 123/106  (!) 123/109 (!) 134/116  Pulse: 76  77 71  Resp: (!) 22  16 (!) 24  SpO2: 96% 98% 100% 100%   Eyes: PERRL, lids and conjunctivae normal ENMT: Mucous membranes are moist. Posterior pharynx clear of any exudate or lesions.  Neck: normal, supple, no masses, no thyromegaly.  JVD present Respiratory: Decreased overall aeration but no significant wheezes or rhonchi appreciated.  Patient maintaining O2 saturations on 2 L nasal  cannula oxygen Cardiovascular: Regular rate and rhythm with intermittent extra beats with positive systolic murmur appreciated.  2-3+ pitting edema extremity edema. 2+ pedal pulses. No carotid bruits.  Abdomen: no tenderness, no masses palpated. No hepatosplenomegaly. Bowel sounds positive.  Musculoskeletal: no clubbing / cyanosis. No joint deformity upper and lower extremities. Good ROM, no contractures. Normal muscle tone.  Skin: no rashes, lesions, ulcers. No induration Neurologic: CN 2-12 grossly intact.  Strength 5/5 in all 4 extremities Psychiatric: Poor judgment and insight. Alert and oriented x 3. Normal mood.     Labs on Admission: I have personally reviewed following labs and imaging studies  CBC: Recent Labs  Lab 06/13/21 0057  WBC 6.2  HGB 12.8*  HCT 38.9*  MCV 85.3  PLT 274   Basic Metabolic Panel: Recent Labs  Lab 06/13/21 0057  NA 137  K 3.7  CL 105  CO2 21*  GLUCOSE 108*  BUN 49*  CREATININE 1.88*  CALCIUM 9.0   GFR: Estimated Creatinine Clearance: 57.6 mL/min (A) (by C-G formula based on SCr of 1.88 mg/dL (H)). Liver Function Tests: No results for input(s): AST, ALT, ALKPHOS, BILITOT, PROT, ALBUMIN in the last 168 hours. No results for input(s): LIPASE, AMYLASE in the last 168 hours. No results for input(s): AMMONIA in the last 168 hours. Coagulation Profile: No results for input(s): INR, PROTIME in the last 168 hours. Cardiac Enzymes: No results for input(s): CKTOTAL, CKMB, CKMBINDEX, TROPONINI in the last 168 hours. BNP (last 3 results) No results for input(s): PROBNP in the last 8760 hours. HbA1C: No results for input(s): HGBA1C in the last 72 hours. CBG: No results for input(s): GLUCAP in the last 168 hours. Lipid Profile: No results for input(s): CHOL, HDL, LDLCALC, TRIG, CHOLHDL, LDLDIRECT in the last 72 hours. Thyroid Function Tests: No results for input(s): TSH, T4TOTAL, FREET4, T3FREE, THYROIDAB in the last 72 hours. Anemia Panel: No  results for input(s): VITAMINB12, FOLATE, FERRITIN, TIBC, IRON, RETICCTPCT in the last 72 hours. Urine analysis:    Component Value Date/Time   COLORURINE STRAW (A) 12/25/2020 1143   APPEARANCEUR CLEAR (A) 12/25/2020 1143   LABSPEC 1.006 12/25/2020 1143   PHURINE 7.0 12/25/2020 1143   GLUCOSEU NEGATIVE 12/25/2020 1143   HGBUR NEGATIVE 12/25/2020 1143   BILIRUBINUR NEGATIVE 12/25/2020 1143   KETONESUR NEGATIVE 12/25/2020 1143   PROTEINUR NEGATIVE 12/25/2020 1143   UROBILINOGEN 0.2 03/11/2016 0934   NITRITE NEGATIVE 12/25/2020 1143   LEUKOCYTESUR NEGATIVE 12/25/2020 1143   Sepsis Labs: No results found for this or any previous visit (from the past 240 hour(s)).   Radiological Exams on Admission: DG Chest 2 View  Result Date: 06/13/2021 CLINICAL DATA:  Shortness of breath EXAM: CHEST - 2 VIEW COMPARISON:  06/01/2021 FINDINGS: Cardiac shadow is enlarged but stable. The lungs are well aerated bilaterally. No focal infiltrate or effusion is seen. No bony abnormality is noted. IMPRESSION: No active cardiopulmonary disease. Electronically Signed   By: Alcide Clever M.D.   On: 06/13/2021 01:11    EKG: Independently reviewed.  Sinus rhythm with premature atrial complexes and first-degree heart block.  QTc 515  Assessment/Plan  Combined systolic and diastolic CHF: Acute on chronic.  Patient presents with complaints of shortness of breath, leg swelling, and pain.  Notes running out of torsemide couple days ago.  Physical exam reveals at least 2-3+ pitting edema with no significant crackles appreciated on lung exam.  Last echocardiogram from 05/12/2021 revealed EF of less than 10% with grade 2 diastolic dysfunction. -Admit to a telemetry bed -Heart failure orders set  initiated  -Continuous pulse oximetry with nasal cannula oxygen as needed to keep O2 saturations >92% -Strict I&Os and daily weights -Elevate lower extremities -Lasix 80 mg IV bid -Continue Coreg, losartan, aspirin, and  statin -PT/OT to eval and treat -Reassess in a.m. and adjust diuresis as needed -Transitions of care consulted -Cardiology consulted, will follow-up for any further recommendation -Palliative care was consulted  Elevated troponin CAD: Chronic.  Patient did report having some chest pain.  Troponins mildly elevated 28-> 26, previously elevated similar in the past.  Last heart cath when 05/2019 noted single coronary artery disease with total occlusion of the proximal RCA and mild to moderate nonobstructive CAD involving the LAD and left circumflex with mild pulmonary hypertension at that time.  Medical management was recommended. -Continue aspirin, statin, and beta-blocker  Acute kidney injury superimposed on chronic kidney disease stage III : Patient presents with creatinine elevated to 1.88, but previously baseline creatinine noted to be 1.4.  Question possible cardiorenal syndrome if kidney function worsens. -Continue to monitor kidney function daily with diuresis -Consider discontinuing losartan if kidney function continues to decline  Prolonged QT interval history of NSVT: QTC 515.  Patient with history of NSVT previously in the past.  He is at increased risk for tachyarrhythmias. -Avoid QT prolonging medication -Correct electrolyte abnormalities -Follow-up telemetry  Essential hypertension: Blood pressures currently maintained. -Continue losartan as tolerated  Hyperlipidemia -Continue atorvastatin  Normocytic anemia: Chronic.  Hemoglobin 12.8 which appears stable. -Continue to monitor  Polysubstance abuse: Patient reports last using cocaine 2 weeks ago.  He still smokes marijuana but does not report drinking alcohol.  He request hospital inpatient rehab for his history of drug use. -Transitions of care for possible inpatient drug abuse rehab options -Continue counseling patient on the need of cessation of illicit drug use  Obesity: BMI 38.58 kg/m  DVT prophylaxis: Lovenox Code  Status: Full Family Communication: Patient declined need to update family Disposition Plan: To be determined Consults called: Cardiology Admission status: Inpatient, require more than 2 midnight stay for IV diuresis  Clydie Braun MD [facility]   If 7PM-7AM, please contact night-coverage   06/13/2021, 10:35 AM

## 2021-06-13 NOTE — Progress Notes (Signed)
Heart Failure Stewardship Pharmacist Progress Note   PCP: Nahser, Deloris Ping, MD PCP-Cardiologist: Kristeen Miss, MD    HPI:  60 yo M with PMH of CAD, CKD III, mixed ischemic and nonischemic cardiomyopathy, HTN, HLD, medication noncompliance, alcohol and cocaine abuse, and VT. He presented to the ED on 8/10 with chest pain, shortness of breath, LE edema, and reported noncompliance with torsemide. He states he ran out of his torsemide 3-4 days prior to admission. He was recently hospitalized on 7/29-8/2 at [facility] for acute systolic heart failure. He left AMA. Last ECHO done on 05/12/21 and LVEF was <10% with G2DD and severe RV systolic dysfunction. All prior ECHOs reporting back to 09/2018 have shown LVEF ~20% and less. UDS on admission positive for THC. CXR showed no active cardiopulmonary disease. Last R/LHC done in July 2020 and found to have severe single vessel CAD with chronic total occlusion of proximal RCA (RA 17, PA 25, wedge 24, CO 7.7, CI 3.4).  Current HF Medications: Furosemide 80 mg IV BID  Prior to admission HF Medications: Torsemide 40 mg daily Carvedilol 6.25 mg BID Losartan 12.5 mg daily  Pertinent Lab Values: Serum creatinine 1.88, BUN 49, Potassium 3.7, Sodium 137, BNP 2895.8  A1c 6.1 (05/11/21)  Vital Signs: Weight: 268 lbs (admission weight: 268 lbs) Blood pressure: 120-140/100s  Heart rate: 70s   Medication Assistance / Insurance Benefits Check: Does the patient have prescription insurance?  Yes Type of insurance plan: [place] Medicaid  Outpatient Pharmacy:  Prior to admission outpatient pharmacy: [facility] Is the patient willing to use [facility] TOC pharmacy at discharge? Yes Is the patient willing to transition their outpatient pharmacy to utilize a [facility] outpatient pharmacy?   Pending    Assessment: 1. Acute on chronic systolic CHF (EF <96%), due to mixed ischemic and nonischemic cardiomyopathy. NYHA class III symptoms. - Agree with starting furosemide 80 mg IV BID -  Continue to hold carvedilol  - PTA losartan on hold. SCr elevated to 1.88 (baseline ~1.5). Consider transitioning to [facility] prior to discharge - Consider starting spironolactone and SGLT2i prior to discharge   Plan: 1) Medication changes recommended at this time: - Continue IV diuresis  2) Patient assistance: - Patient has [place] Medicaid - all copays should be $0-4 per month and copays legally required to be waived at [facility] pharmacies if he cannot afford his prescriptions - HF TOC appt scheduled for 8/22 - Will consult HF CSW and RNCM to assist with SDOH needs  3)  Education  - To be completed prior to discharge  Sharen Hones, PharmD, BCPS Heart Failure Stewardship Pharmacist Phone [phone]

## 2021-06-13 NOTE — Consult Note (Signed)
Cardiology Consultation:   Patient ID: Dale [name] MRN: [id number]; DOB: 1961-09-01  Admit date: 06/13/2021 Date of Consult: 06/13/2021  PCP:  Vesta Mixer, MD   [facility] Providers Cardiologist:  Kristeen Miss, MD    Patient Profile:   Dale [name] is a 60 y.o. male with a history of CAD with CTO of the RCA by cath in July 2020, CKD stage 3, mixed non-ischemic and ischemic cardiomyopathy with chronic combined CHF with LVEF=10% by echo July 2022, HTN, HLD, medication non-compliance, etoh abuse, cocaine abuse and VT who is being seen 06/13/2021 for the evaluation of CHF at the request of Dr. Estell Harpin.  History of Present Illness:   Dale [name] is a 60 y.o. male with a history of CAD with CTO of the RCA by cath in July 2020, mixed non-ischemic and ischemic cardiomyopathy with chronic combined CHF with LVEF=10% by echo July 2022, HTN, HLD, medication non-compliance, etoh abuse, cocaine abuse and VT who is being seen 06/13/2021 for the evaluation of CHF at the request of Dr. Estell Harpin. He has had multiple hospitalizations for volume overload requiring IV diuretics. Last cardiac cath 05/2019 showed CTA of RCA with L>R collaterals, otherwise non-obstructive CAD which was medically managed. He has a history of medication non-compliance. He was admitted to [facility] two weeks ago with the same issues and left AMA after receiving IV Lasix for several days.   He now presents to the ED with c/o swelling in his legs and dyspnea. He has been out of all of his medications except ASA and Torsemide since April 2022. He ran out of Torsemide 3 days ago but has been missing doses over the past few months. He blames his medication non-compliance on lack of money but has been finding cash for marijuana. He does not mention that he was [facility] 10 days ago and left AMA. No chest pain. Creatinine 1.88 from baseline 1.4-1.6. WBC 6.2. BNP 2895. The lowest his BNP has been over the past 5 months is 916. Troponin 26  and 28. UDS positive for THc, negative for cocaine. EKG with sinus, PVCs, chronic T wave flattening-no change.   He has been given IV Lasix by the ED staff. He is being admitted to the Medicine service. He has responded to IV Lasix already with good urine output.    Past Medical History:  Diagnosis Date   Alcohol abuse    CAD (coronary artery disease)    a. 05/2019 Cath: LM nl, LAD 15p, 57m, LCX large/nl, OM2 30, OM3 20, RCA 100 CTO w/ bridging L->R collats to RPDA.   Chronic combined systolic and diastolic CHF (congestive heart failure) (HCC)    a. 09/27/18 Echo: EF 20-25%, grade 2 DD; b. 03/2019 Echo: EF 20-25%; c. 05/2021 Echo: EF <10%, glob HK. GrII DD. Sev red RV fxn. Sev BAE. Mild MR. Mild-mod TR.   CKD (chronic kidney disease), stage II    Cocaine abuse (HCC)    Hyperlipidemia LDL goal <70    Hypertension    Ischemic cardiomyopathy    a. 09/27/18 Echo: EF 20-25%, grade 2 DD; b. 03/2019 Echo: EF 20-25%; c. 05/2019 Cath: RCA 100 CTA, otw nonobs dzs; d. 05/2021 Echo: EF <10%, glob HK. GrII DD. Sev red RV fxn.   Morbid obesity (HCC)    Noncompliance    Normocytic anemia     Past Surgical History:  Procedure Laterality Date   RIGHT/LEFT HEART CATH AND CORONARY ANGIOGRAPHY N/A 05/10/2019   Procedure: RIGHT/LEFT HEART CATH AND  CORONARY ANGIOGRAPHY;  Surgeon: Yvonne Kendall, MD;  Location: [facility] INVASIVE CV LAB;  Service: Cardiovascular;  Laterality: N/A;     Home Medications:  Prior to Admission medications   Medication Sig Start Date End Date Taking? Authorizing Provider  aspirin EC 81 MG EC tablet Take 1 tablet (81 mg total) by mouth daily. Swallow whole. 12/17/20  Yes Arnetha Courser, MD  carvedilol (COREG) 6.25 MG tablet Take 1 tablet (6.25 mg total) by mouth 2 (two) times daily with a meal. 05/17/21 06/16/21 Yes Kc, Dayna Barker, MD  ferrous sulfate 325 (65 FE) MG EC tablet Take 325 mg by mouth daily with breakfast.   Yes [provider]  gabapentin (NEURONTIN) 300 MG capsule Take 1  capsule (300 mg total) by mouth at bedtime. 10/21/20  Yes Noralee Stain, DO  losartan (COZAAR) 25 MG tablet Take 0.5 tablets (12.5 mg total) by mouth daily. 05/17/21  Yes Kc, Dayna Barker, MD  potassium chloride SA (KLOR-CON) 20 MEQ tablet Take 20 mEq by mouth daily.   Yes [provider]  Torsemide 40 MG TABS Take 40 mg by mouth daily at 6 (six) AM. 05/17/21 06/16/21 Yes Kc, Dayna Barker, MD  atorvastatin (LIPITOR) 40 MG tablet Take 1 tablet (40 mg total) by mouth daily. 05/17/21 06/16/21  Lanae Boast, MD  diclofenac sodium (VOLTAREN) 1 % GEL Apply 2 g topically 4 (four) times daily as needed (pain). Patient not taking: No sig reported    [provider]    Inpatient Medications: Scheduled Meds:  enoxaparin (LOVENOX) injection  60 mg Subcutaneous Q24H   furosemide  80 mg Intravenous BID   sodium chloride flush  3 mL Intravenous Q12H   Continuous Infusions:  sodium chloride     PRN Meds: sodium chloride, acetaminophen, ondansetron (ZOFRAN) IV, sodium chloride flush  Allergies:    Allergies  Allergen Reactions   Hydrocodone Itching    Social History:   Social History   Socioeconomic History   Marital status: Single    Spouse name: none   Number of children: Not on file   Years of education: Not on file   Highest education level: Not on file  Occupational History   Occupation: Disabled  Tobacco Use   Smoking status: Never   Smokeless tobacco: Never  Vaping Use   Vaping Use: Never used  Substance and Sexual Activity   Alcohol use: Yes    Comment: 2 quarts a week    Drug use: Yes    Frequency: 3.0 times per week    Types: Marijuana, Cocaine   Sexual activity: Not Currently  Other Topics Concern   Not on file  Social History Narrative   Lives w/ mother in [place] but recently visiting w/ dtr in [place].   Social Determinants of Health   Financial Resource Strain: Medium Risk   Difficulty of Paying Living Expenses: Somewhat hard  Food Insecurity: Geophysicist/field seismologist  Present   Worried About Programme researcher, broadcasting/film/video in the Last Year: Sometimes true   Barista in the Last Year: Sometimes true  Transportation Needs: Unmet Transportation Needs   Lack of Transportation (Medical): Yes   Lack of Transportation (Non-Medical): Yes  Physical Activity: Not on file  Stress: Not on file  Social Connections: Not on file  Intimate Partner Violence: Not on file    Family History:   Family History  Problem Relation Age of Onset   Hypertension Maternal Grandmother      ROS:  Please see the history of present  illness.  All other ROS reviewed and negative.     Physical Exam/Data:   Vitals:   06/13/21 0633 06/13/21 0715 06/13/21 0900 06/13/21 1000  BP: (!) 123/106  (!) 123/109 (!) 134/116  Pulse: 76  77 71  Resp: (!) 22  16 (!) 24  SpO2: 96% 98% 100% 100%    Intake/Output Summary (Last 24 hours) at 06/13/2021 1152 Last data filed at 06/13/2021 1059 Gross per 24 hour  Intake --  Output 700 ml  Net -700 ml   Last 3 Weights 06/05/2021 06/04/2021 06/04/2021  Weight (lbs) 268 lb 3.2 oz 264 lb 8.8 oz 274 lb 12.8 oz  Weight (kg) 121.655 kg 120 kg 124.648 kg     There is no height or weight on file to calculate BMI.  General:  Obese male in no acute distress HEENT: normal Lymph: no adenopathy Neck: + JVD Endocrine:  No thryomegaly Vascular: No carotid bruits Cardiac:  normal S1, S2; RRR; no murmur  Lungs:  clear to auscultation bilaterally, no wheezing, rhonchi or rales  Abd: soft, nontender, no hepatomegaly  Ext: 2-3+ bilateral LE pitting edema Musculoskeletal:  No deformities, BUE and BLE strength normal and equal Skin: warm and dry  Neuro:  CNs 2-12 intact, no focal abnormalities noted Psych:  Normal affect   EKG:  The EKG was personally reviewed and demonstrates:  Sinus, PVCs, chronic T wave flattening. Unchanged Telemetry:  Telemetry was personally reviewed and demonstrates:  sinus  Relevant CV Studies:  Echo 05/12/21:  1. No left ventriculatr  thrombus is seen. Left ventricular ejection  fraction, by estimation, is <10%. The left ventricle has severely  decreased function. The left ventricle demonstrates global hypokinesis.  The left ventricular internal cavity size was  severely dilated. Left ventricular diastolic parameters are consistent  with Grade II diastolic dysfunction (pseudonormalization). Elevated left  atrial pressure.   2. Right ventricular systolic function is severely reduced. The right  ventricular size is severely enlarged. There is mildly elevated pulmonary  artery systolic pressure.   3. Left atrial size was severely dilated.   4. Right atrial size was severely dilated.   5. The mitral valve is normal in structure. Mild mitral valve  regurgitation.   6. Tricuspid valve regurgitation is mild to moderate.   7. The aortic valve is tricuspid. Aortic valve regurgitation is not  visualized. No aortic stenosis is present.   8. The inferior vena cava is dilated in size with <50% respiratory  variability, suggesting right atrial pressure of 15 mmHg.   Laboratory Data:  High Sensitivity Troponin:   Recent Labs  Lab 06/01/21 2000 06/01/21 2153 06/13/21 0057 06/13/21 0250  TROPONINIHS 1,279* 1,127* 28* 26*     Chemistry Recent Labs  Lab 06/13/21 0057  NA 137  K 3.7  CL 105  CO2 21*  GLUCOSE 108*  BUN 49*  CREATININE 1.88*  CALCIUM 9.0  GFRNONAA 41*  ANIONGAP 11    No results for input(s): PROT, ALBUMIN, AST, ALT, ALKPHOS, BILITOT in the last 168 hours. Hematology Recent Labs  Lab 06/13/21 0057  WBC 6.2  RBC 4.56  HGB 12.8*  HCT 38.9*  MCV 85.3  MCH 28.1  MCHC 32.9  RDW 16.7*  PLT 274   BNP Recent Labs  Lab 06/13/21 0057  BNP 2,895.8*    DDimer No results for input(s): DDIMER in the last 168 hours.   Radiology/Studies:  DG Chest 2 View  Result Date: 06/13/2021 CLINICAL DATA:  Shortness of breath EXAM: CHEST -  2 VIEW COMPARISON:  06/01/2021 FINDINGS: Cardiac shadow is enlarged  but stable. The lungs are well aerated bilaterally. No focal infiltrate or effusion is seen. No bony abnormality is noted. IMPRESSION: No active cardiopulmonary disease. Electronically Signed   By: Alcide Clever M.D.   On: 06/13/2021 01:11     Assessment and Plan:   Acute on chronic systolic CHF: He has a long standing history of CHF with mixed non-ischemic and ischemic cardiomyopathy. He frequently misses his medications. He has been out of all medications except ASA and Torsemide since April 2022. He ran out of Torsemide 3-4 days ago.   -Agree with admission for diuresis with IV Lasix. Would recommend Lasix 80 mg IV BID.  He may require a Lasix drip and consideration for milrinone infusion.  -Continue his other home medications including Coreg, Losartan, atorvastatin, ASA.  -He is not a good candidate for Entresto given poor compliance and renal failure.  -Consider palliative care consult -Consider Advanced Heart Failure team consult during admission. Will ask them to see him tomorrow if he is not diuresing well.  -He will need close follow up in our Advanced Heart Failure clinic after discharge given his high risk of readmission.    { For questions or updates, please contact [facility] Please consult [email or web address] for contact info under    Signed, Verne Carrow, MD  06/13/2021 11:52 AM.t

## 2021-06-14 ENCOUNTER — Other Ambulatory Visit: Payer: Self-pay

## 2021-06-14 ENCOUNTER — Encounter (HOSPITAL_COMMUNITY): Payer: Self-pay | Admitting: Internal Medicine

## 2021-06-14 DIAGNOSIS — I5040 Unspecified combined systolic (congestive) and diastolic (congestive) heart failure: Secondary | ICD-10-CM

## 2021-06-14 DIAGNOSIS — R9431 Abnormal electrocardiogram [ECG] [EKG]: Secondary | ICD-10-CM | POA: Diagnosis present

## 2021-06-14 LAB — BASIC METABOLIC PANEL
Anion gap: 13 (ref 5–15)
BUN: 53 mg/dL — ABNORMAL HIGH (ref 6–20)
CO2: 24 mmol/L (ref 22–32)
Calcium: 8.9 mg/dL (ref 8.9–10.3)
Chloride: 101 mmol/L (ref 98–111)
Creatinine, Ser: 2.07 mg/dL — ABNORMAL HIGH (ref 0.61–1.24)
GFR, Estimated: 37 mL/min — ABNORMAL LOW (ref >60)
Glucose, Bld: 110 mg/dL — ABNORMAL HIGH (ref 70–99)
Potassium: 3.8 mmol/L (ref 3.5–5.1)
Sodium: 138 mmol/L (ref 135–145)

## 2021-06-14 LAB — MAGNESIUM: Magnesium: 2.5 mg/dL — ABNORMAL HIGH (ref 1.7–2.4)

## 2021-06-14 NOTE — Evaluation (Signed)
Physical Therapy Evaluation Patient Details Name: Dale [name] MRN: [id number] DOB: 06-29-1961 Today's Date: 06/14/2021   History of Present Illness  Pt is a 60 y.o. male admitted 06/13/21 with SOB and BLE swelling; of note, pt ran of out torsemide medication 2-3 days prior. Workup for CHF exacerbation. PMH includes HTN, HLD, NICM/ICM, CHF, CKD 3, polysubstance abuse.   Clinical Impression  Pt presents with an overall decrease in functional mobility secondary to above. PTA, pt mod indep ambulating with rollator, was living at hotel with friend, but reports unsafe environment to return to; pt hopeful to d/c to substance abuse rehab facility. Today, pt moving well with rollator and supervision for safety; limited by decreased activity tolerance resulting in fatigue and SOB. Pt would benefit from continued acute PT services to maximize functional mobility and independence prior to d/c.   SpO2 97% on RA at rest SpO2 94% on RA with ambulation    Follow Up Recommendations No PT follow up    Equipment Recommendations  None recommended by PT    Recommendations for Other Services       Precautions / Restrictions Precautions Precautions: Fall Restrictions Weight Bearing Restrictions: No      Mobility  Bed Mobility Overal bed mobility: Modified Independent             General bed mobility comments: HOB elevated    Transfers Overall transfer level: Modified independent Equipment used: 4-wheeled walker                Ambulation/Gait Ambulation/Gait assistance: Supervision Gait Distance (Feet): 60 Feet Assistive device: 4-wheeled walker Gait Pattern/deviations: Step-through pattern;Decreased stride length;Trunk flexed Gait velocity: Decreased   General Gait Details: Slow, guarded gait with rollator and supervision for safety; 1x seated rest break secondary to c/o fatigue and SOB; SpO2 >/94% on RA, DOE 2-3/4  Stairs            Wheelchair Mobility     Modified Rankin (Stroke Patients Only)       Balance Overall balance assessment: Needs assistance Sitting-balance support: No upper extremity supported;Feet supported Sitting balance-Leahy Scale: Good       Standing balance-Leahy Scale: Fair                               Pertinent Vitals/Pain Pain Assessment: Faces Faces Pain Scale: Hurts a little bit Pain Location: bilateral lower legs/feet Pain Descriptors / Indicators: Sore;Tightness Pain Intervention(s): Monitored during session    Home Living Family/patient expects to be discharged to:: Unsure               Home Equipment: Walker - 4 wheels Additional Comments: Pt from hotel with friend, but reports he is not returning to that environment due to drug-related issues. Pt hopeful to d/c to substance abuse rehab facility    Prior Function Level of Independence: Independent with assistive device(s)         Comments: Mod indep ambulating with rollator     Hand Dominance        Extremity/Trunk Assessment   Upper Extremity Assessment Upper Extremity Assessment: Overall WFL for tasks assessed    Lower Extremity Assessment Lower Extremity Assessment: Generalized weakness (related to stiffness/swelling; pt reports significant improved since admission)       Communication   Communication: No difficulties  Cognition Arousal/Alertness: Awake/alert Behavior During Therapy: WFL for tasks assessed/performed Overall Cognitive Status: Within Functional Limits for tasks assessed  General Comments: WFL for majority of simple tasks; tangential with speech at times requiring redirection; very focused on discussing how much he is ready to change his life, stop abusing cocaine and go to substance abuse rehab      General Comments General comments (skin integrity, edema, etc.): SpO2 100% on 2L at rest; maintaining >/97% on RA at rest; >/94% on RA with  ambulation. Educ on importance of BLE elevation and edema control    Exercises     Assessment/Plan    PT Assessment Patient needs continued PT services  PT Problem List Decreased strength;Decreased activity tolerance;Decreased balance;Decreased mobility;Cardiopulmonary status limiting activity       PT Treatment Interventions DME instruction;Gait training;Stair training;Functional mobility training;Therapeutic activities;Balance training;Therapeutic exercise;Patient/family education    PT Goals (Current goals can be found in the Care Plan section)  Acute Rehab PT Goals Patient Stated Goal: Discharge to substance abuse rehab and stay off cocaine PT Goal Formulation: With patient Time For Goal Achievement: 06/28/21 Potential to Achieve Goals: Good    Frequency Min 3X/week   Barriers to discharge Decreased caregiver support;Inaccessible home environment      Co-evaluation               AM-PAC PT "6 Clicks" Mobility  Outcome Measure Help needed turning from your back to your side while in a flat bed without using bedrails?: None Help needed moving from lying on your back to sitting on the side of a flat bed without using bedrails?: None Help needed moving to and from a bed to a chair (including a wheelchair)?: None Help needed standing up from a chair using your arms (e.g., wheelchair or bedside chair)?: None Help needed to walk in hospital room?: A Little Help needed climbing 3-5 steps with a railing? : A Little 6 Click Score: 22    End of Session   Activity Tolerance: Patient tolerated treatment well Patient left: in chair;with call bell/phone within reach Nurse Communication: Mobility status PT Visit Diagnosis: Other abnormalities of gait and mobility (R26.89)    Time: 7673-4193 PT Time Calculation (min) (ACUTE ONLY): 21 min   Charges:   PT Evaluation $PT Eval Low Complexity: 1 Low         Ina Homes, PT, DPT Acute Rehabilitation Services  Pager  [phone] Office [phone]  Dale [name] 06/14/2021, 3:45 PM

## 2021-06-14 NOTE — Plan of Care (Signed)

## 2021-06-14 NOTE — Progress Notes (Signed)
PROGRESS NOTE    Dale [name]  VEL:[id number] DOB: 01/24/1961 DOA: 06/13/2021 PCP: Vesta Mixer, MD    Brief Narrative:  Dale [name] was admitted to the hospital working diagnosis of acute decompensated systolic heart failure.  60 year old male past medical history for hypertension, dyslipidemia, systolic heart failure, chronic kidney disease, cocaine abuse, history of ventricular tachycardia who presented with dyspnea and lower extremity edema.  Apparently ran out of his diuretic therapy 2 to 3 days ago.  Developing worsening dyspnea to the point where he became symptomatic with minimal efforts.  Severe lower extremity edema.  On his initial physical examination blood pressure 123/106, heart rate 76, respiratory 22, oxygen saturation 98%, his lungs had decreased breath sounds bilaterally, heart S1-S2, present, rhythmic, soft abdomen, positive 2-3 + bilateral extremity edema.  Patient has been placed on aggressive diuresis with intravenous furosemide.  Assessment & Plan:   Principal Problem:   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) Active Problems:   Essential hypertension   Obesity (BMI 30-39.9)   Normochromic anemia   NSVT (nonsustained ventricular tachycardia) (HCC)   Troponin level elevated   Acute kidney injury superimposed on chronic kidney disease (HCC)   Prolonged QT interval   Acute on chronic systolic heart failure. LV EF less than 10% per echocardiogram on 05/2021.  Continue to have significant hypervolemia and orthopnea.  Plan to continue aggressive diuresis with IV furosemide to target a negative fluid balance. Continue with carvedilol for Beta blockade and ARB with losartan. Prolonged Qtc and history of SVT.   2. AKI on CKD stage 3a. Continue diuresis with furosemide and follow up renal function in am.  Serum cr today is 2,0 with K at 3,8 and serum bicarbonate at 24.   Anemia of chronic renal disease. Hgb stable    3. HTN/ dyslipidemia . Continue blood pressure control with losartan and carvedilol.   Continue with statin therapy   4. Polysubstance abuse. Continue neuro checks per unit protocol, no clinical sings of withdrawal.   5. Obesity class 2. Calculated BMI is 38,5    Patient continue to be at high risk for worsening heart failure   Status is: Inpatient  Remains inpatient appropriate because:IV treatments appropriate due to intensity of illness or inability to take PO  Dispo: The patient is from: Home              Anticipated d/c is to: Home              Patient currently is not medically stable to d/c.   Difficult to place patient No   DVT prophylaxis: Enoxaparin   Code Status:    Full   Family Communication:   No family at the bedside      Consultants:  Cardiology    Subjective: Patient continue to have significant lower extremity edema, no nausea or vomiting, no chest pain. Positive orthopnea,.   Objective: Vitals:   06/14/21 0447 06/14/21 0450 06/14/21 0748 06/14/21 1403  BP: (!) 126/103  120/87 112/81  Pulse: 83  80 80  Resp: 18  18 17   Temp: 97.9 F (36.6 C)  97.8 F (36.6 C) 97.8 F (36.6 C)  TempSrc:   Oral Oral  SpO2: 100%  100% 96%  Weight:  125.5 kg    Height:        Intake/Output Summary (Last 24 hours) at 06/14/2021 1514 Last data filed at 06/14/2021 1500 Gross per 24 hour  Intake 1100 ml  Output  2400 ml  Net -1300 ml   Filed Weights   06/13/21 1849 06/14/21 0450  Weight: 124.8 kg 125.5 kg    Examination:   General: Not in pain or dyspnea, deconditioned  Neurology: Awake and alert, non focal  E ENT: no pallor, no icterus, oral mucosa moist Cardiovascular: moderate JVD. S1-S2 present, rhythmic, no gallops, rubs, or murmurs. +++ pitting bilteral lower extremity edema. Pulmonary: positive breath sounds bilaterally, decreased air movement, no wheezing, rhonchi or rales. Gastrointestinal. Abdomen protuberant  Skin. No rashes Musculoskeletal:  no joint deformities     Data Reviewed: I have personally reviewed following labs and imaging studies  CBC: Recent Labs  Lab 06/13/21 0057  WBC 6.2  HGB 12.8*  HCT 38.9*  MCV 85.3  PLT 274   Basic Metabolic Panel: Recent Labs  Lab 06/13/21 0057 06/14/21 0346 06/14/21 0646  NA 137 138  --   K 3.7 3.8  --   CL 105 101  --   CO2 21* 24  --   GLUCOSE 108* 110*  --   BUN 49* 53*  --   CREATININE 1.88* 2.07*  --   CALCIUM 9.0 8.9  --   MG  --   --  2.5*   GFR: Estimated Creatinine Clearance: 53.1 mL/min (A) (by C-G formula based on SCr of 2.07 mg/dL (H)). Liver Function Tests: No results for input(s): AST, ALT, ALKPHOS, BILITOT, PROT, ALBUMIN in the last 168 hours. No results for input(s): LIPASE, AMYLASE in the last 168 hours. No results for input(s): AMMONIA in the last 168 hours. Coagulation Profile: No results for input(s): INR, PROTIME in the last 168 hours. Cardiac Enzymes: No results for input(s): CKTOTAL, CKMB, CKMBINDEX, TROPONINI in the last 168 hours. BNP (last 3 results) No results for input(s): PROBNP in the last 8760 hours. HbA1C: No results for input(s): HGBA1C in the last 72 hours. CBG: No results for input(s): GLUCAP in the last 168 hours. Lipid Profile: No results for input(s): CHOL, HDL, LDLCALC, TRIG, CHOLHDL, LDLDIRECT in the last 72 hours. Thyroid Function Tests: Recent Labs    06/13/21 1939  TSH 1.541   Anemia Panel: No results for input(s): VITAMINB12, FOLATE, FERRITIN, TIBC, IRON, RETICCTPCT in the last 72 hours.    Radiology Studies: I have reviewed all of the imaging during this hospital visit personally     Scheduled Meds:  aspirin EC  81 mg Oral Daily   atorvastatin  40 mg Oral Daily   enoxaparin (LOVENOX) injection  60 mg Subcutaneous Q24H   furosemide  80 mg Intravenous BID   gabapentin  300 mg Oral QHS   losartan  12.5 mg Oral Daily   potassium chloride SA  20 mEq Oral Daily   sodium chloride flush  3 mL Intravenous  Q12H   Continuous Infusions:  sodium chloride       LOS: 1 day        [name], MD

## 2021-06-14 NOTE — Plan of Care (Signed)

## 2021-06-14 NOTE — Progress Notes (Signed)
Heart Failure Stewardship Pharmacist Progress Note   PCP: Nahser, Deloris Ping, MD PCP-Cardiologist: Kristeen Miss, MD    HPI:  60 yo M with PMH of CAD, CKD III, mixed ischemic and nonischemic cardiomyopathy, HTN, HLD, medication noncompliance, alcohol and cocaine abuse, and VT. He presented to the ED on 8/10 with chest pain, shortness of breath, LE edema, and reported noncompliance with torsemide. He states he ran out of his torsemide 3-4 days prior to admission. He was recently hospitalized on 7/29-8/2 at [facility] for acute systolic heart failure. He left AMA. Last ECHO done on 05/12/21 and LVEF was <10% with G2DD and severe RV systolic dysfunction. All prior ECHOs reporting back to 09/2018 have shown LVEF ~20% and less. UDS on admission positive for THC. Patient states last cocaine use was approximately 2 weeks ago. CXR showed no active cardiopulmonary disease. Last R/LHC done in July 2020 and found to have severe single vessel CAD with chronic total occlusion of proximal RCA (RA 17, PA 25, wedge 24, CO 7.7, CI 3.4).  Current HF Medications: Furosemide 80 mg IV BID Losartan 12.5 mg daily  Prior to admission HF Medications: Torsemide 40 mg daily Carvedilol 6.25 mg BID Losartan 12.5 mg daily  Pertinent Lab Values: Serum creatinine 2.07, BUN 53, Potassium 3.8, Sodium 138, BNP 2895.8, Magnesium 2.5  A1c 6.1 (05/11/21) I/O: Net -3L since admission   Vital Signs: Weight: 268 lbs (admission weight: 268 lbs) Blood pressure: 120/80s  Heart rate: 70-80s   Medication Assistance / Insurance Benefits Check: Does the patient have prescription insurance?  Yes Type of insurance plan: [place] Medicaid  Outpatient Pharmacy:  [facility] Is the patient willing to use [facility] TOC pharmacy at discharge? Yes Is the patient willing to transition their outpatient pharmacy to utilize a [facility] outpatient pharmacy?   Pending    Assessment: 1. Acute on chronic systolic CHF (EF  <10%), due to mixed ischemic and nonischemic cardiomyopathy. NYHA class III symptoms. - Continue furosemide 80 mg IV BID - Continue to hold carvedilol  - Losartan 12.5 mg resumed. SCr elevated to 2.07 (baseline ~1.5). Consider transitioning to [facility] prior to discharge - Consider starting spironolactone and SGLT2i prior to discharge   Plan: 1) Medication changes recommended at this time: - Continue IV diuresis  2) Patient assistance: - Patient has [place] Medicaid - all copays should be $0-4 per month and copays legally required to be waived at [facility] pharmacies if he cannot afford his prescriptions - HF TOC appt scheduled for 8/22 - Will consult HF CSW and RNCM to assist with SDOH needs  3)  Education  - To be completed prior to discharge  Sharen Hones, PharmD, BCPS Heart Failure Stewardship Pharmacist Phone [phone]

## 2021-06-14 NOTE — Progress Notes (Signed)
Progress Note  Patient Name: Dale [name] Date of Encounter: 06/14/2021  [facility] Cardiologist: Kristeen Miss, MD    Subjective    60 yo with CHF, CAD , CKD, medication noncompliance, alcohol and cocaine abuse, VT Admitted yesterday with CHF Has been noncompliant with torsemide  Was recently in [facility], left AMA   Is on IV lasix,  has diuresed 2.4 liters so far this admission  He has significant compliance issues. He likely will need to multi-disciplinary approach offered in the Advnaced CHF clinic . Will explore that with the CHF team    Inpatient Medications    Scheduled Meds:  aspirin EC  81 mg Oral Daily   atorvastatin  40 mg Oral Daily   enoxaparin (LOVENOX) injection  60 mg Subcutaneous Q24H   furosemide  80 mg Intravenous BID   gabapentin  300 mg Oral QHS   losartan  12.5 mg Oral Daily   potassium chloride SA  20 mEq Oral Daily   sodium chloride flush  3 mL Intravenous Q12H   Continuous Infusions:  sodium chloride     PRN Meds: sodium chloride, acetaminophen, diclofenac sodium, melatonin, ondansetron (ZOFRAN) IV, sodium chloride flush   Vital Signs    Vitals:   06/14/21 0017 06/14/21 0447 06/14/21 0450 06/14/21 0748  BP: 111/89 (!) 126/103  120/87  Pulse: 79 83  80  Resp: 19 18  18   Temp: 98.3 F (36.8 C) 97.9 F (36.6 C)  97.8 F (36.6 C)  TempSrc:    Oral  SpO2: 96% 100%  100%  Weight:   125.5 kg   Height:        Intake/Output Summary (Last 24 hours) at 06/14/2021 1124 Last data filed at 06/14/2021 1050 Gross per 24 hour  Intake 900 ml  Output 2650 ml  Net -1750 ml   Last 3 Weights 06/14/2021 06/13/2021 06/05/2021  Weight (lbs) 276 lb 9.6 oz 275 lb 2.2 oz 268 lb 3.2 oz  Weight (kg) 125.465 kg 124.8 kg 121.655 kg      Telemetry    NSR - Personally Reviewed  ECG    Nsr at 71, occas. PVCs- Personally Reviewed  Physical Exam   GEN: middle age man,  NAD  Neck: No JVD Cardiac: RRR, no murmurs, rubs, or gallops.  Respiratory:  Clear to auscultation bilaterally. GI: Soft, nontender, non-distended  MS: No edema; No deformity. Neuro:  Nonfocal  Psych: Normal affect   Labs    High Sensitivity Troponin:   Recent Labs  Lab 06/01/21 2000 06/01/21 2153 06/13/21 0057 06/13/21 0250  TROPONINIHS 1,279* 1,127* 28* 26*      Chemistry Recent Labs  Lab 06/13/21 0057 06/14/21 0346  NA 137 138  K 3.7 3.8  CL 105 101  CO2 21* 24  GLUCOSE 108* 110*  BUN 49* 53*  CREATININE 1.88* 2.07*  CALCIUM 9.0 8.9  GFRNONAA 41* 37*  ANIONGAP 11 13     Hematology Recent Labs  Lab 06/13/21 0057  WBC 6.2  RBC 4.56  HGB 12.8*  HCT 38.9*  MCV 85.3  MCH 28.1  MCHC 32.9  RDW 16.7*  PLT 274    BNP Recent Labs  Lab 06/13/21 0057  BNP 2,895.8*     DDimer No results for input(s): DDIMER in the last 168 hours.   Radiology    DG Chest 2 View  Result Date: 06/13/2021 CLINICAL DATA:  Shortness of breath EXAM: CHEST - 2 VIEW COMPARISON:  06/01/2021 FINDINGS: Cardiac shadow is enlarged but  stable. The lungs are well aerated bilaterally. No focal infiltrate or effusion is seen. No bony abnormality is noted. IMPRESSION: No active cardiopulmonary disease. Electronically Signed   By: Alcide Clever M.D.   On: 06/13/2021 01:11    Cardiac Studies     Patient Profile     60 y.o. male with hx of cocaine abuse,  CHF   Assessment & Plan     Acute on chronic combined CHF:   complicated by cocaine abuse and medication noncompliance He says he has not used cocaine in 2 weeks. He ran out of his meds.  He still has leg edema Needs several more days of iv lasix  He will need TOC medications at Dc We may need to obtain his DC meds tomorrow if there is a chance he will be discharged this weekend         For questions or updates, please contact [facility] Please consult [email or web address] for contact info under        Signed, Kristeen Miss, MD  06/14/2021, 11:24 AM

## 2021-06-14 NOTE — TOC Initial Note (Signed)
Transition of Care [facility]) - Initial/Assessment Note    Patient Details  Name: Dale [name] MRN: [id number] Date of Birth: 1961/02/25  Transition of Care [facility]) CM/SW Contact:    Elliot Cousin, RN Phone Number: [phone] 06/14/2021, 11:22 AM  Clinical Narrative:                 HF TOC CM spoke to patient. [facility]. Pt states he is living at [address] in [place] with a friend. Pt states he trying to locate permanent housing at a [employer] or he would like information on [employer]. States he does use public transportation. He does receive a monthly check and food stamps but he currently does not have any of his SSI check. Pt states he does need RW. Contacted [employer] for RW with seat to deliver to room prior to dc.   Expected Discharge Plan: Home/Self Care Barriers to Discharge: Continued Medical Work up   Patient Goals and CMS Choice Patient states their goals for this hospitalization and ongoing recovery are:: wants to go back to drug rehab CMS Medicare.gov Compare Post Acute Care list provided to:: Patient Choice offered to / list presented to : Patient  Expected Discharge Plan and Services Expected Discharge Plan: Home/Self Care In-house Referral: Clinical Social Work Discharge Planning Services: CM Consult Post Acute Care Choice: Durable Medical Equipment Living arrangements for the past 2 months: Hotel/Motel                 DME Arranged: Walker rolling with seat DME Agency: [employer] Date DME Agency Contacted: 06/14/21 Time DME Agency Contacted: 1118 Representative spoke with at DME Agency: Violet Baldy            Prior Living Arrangements/Services Living arrangements for the past 2 months: Hotel/Motel Lives with:: Friends Patient language and need for interpreter reviewed:: Yes Do you feel safe going back to the place where you live?: Yes      Need for Family Participation in Patient Care: No  (Comment) Care giver support system in place?: No (comment)   Criminal Activity/Legal Involvement Pertinent to Current Situation/Hospitalization: No - Comment as needed (Probation in [facility])  Activities of Daily Living Home Assistive Devices/Equipment: Medical laboratory scientific officer (specify quad or straight) ADL Screening (condition at time of admission) Patient's cognitive ability adequate to safely complete daily activities?: Yes Is the patient deaf or have difficulty hearing?: No Does the patient have difficulty seeing, even when wearing glasses/contacts?: No Does the patient have difficulty concentrating, remembering, or making decisions?: No Patient able to express need for assistance with ADLs?: Yes Does the patient have difficulty dressing or bathing?: Yes Independently performs ADLs?: No Communication: Independent Dressing (OT): Needs assistance Is this a change from baseline?: Pre-admission baseline Feeding: Independent Bathing: Needs assistance Is this a change from baseline?: Pre-admission baseline Toileting: Needs assistance In/Out Bed: Needs assistance Is this a change from baseline?: Pre-admission baseline Walks in Home: Needs assistance Is this a change from baseline?: Pre-admission baseline Does the patient have difficulty walking or climbing stairs?: Yes Weakness of Legs: Both Weakness of Arms/Hands: None  Permission Sought/Granted Permission sought to share information with : Case Manager, PCP, Family Supports Permission granted to share information with : Yes, Verbal Permission Granted  Share Information with NAME: Burnard Leigh  Permission granted to share info w AGENCY: DME  Permission granted to share info w Relationship: mother  Permission granted to share info w Contact Information: [phone]  Emotional Assessment Appearance:: Appears stated age Attitude/Demeanor/Rapport: Gracious   Orientation: : Oriented to Self, Oriented to Place, Oriented to  Time, Oriented  to Situation Alcohol / Substance Use: Illicit Drugs Psych Involvement: No (comment)  Admission diagnosis:  CHF (congestive heart failure) (HCC) [I50.9] 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) [I50.40] Systolic congestive heart failure, unspecified HF chronicity (HCC) [I50.20] Patient Active Problem List   Diagnosis Date Noted   Prolonged QT interval 06/14/2021   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) 06/13/2021   CHF (congestive heart failure) (HCC) 06/13/2021   Acute on chronic systolic CHF (congestive heart failure), NYHA class 4 (HCC)    CKD (chronic kidney disease), stage III (HCC) 06/01/2021   Hypokalemia    Acute on chronic HFrEF (heart failure with reduced ejection fraction) (HCC) 12/25/2020   Coronary artery disease involving native coronary artery of native heart without angina pectoris    Acute kidney injury superimposed on chronic kidney disease (HCC) 10/16/2020   Acute on chronic combined systolic and diastolic HF (heart failure) (HCC) 09/08/2020   Acute on chronic combined systolic (congestive) and diastolic (congestive) heart failure (HCC)    Acute CHF (congestive heart failure) (HCC) 05/07/2019   Nausea & vomiting 05/07/2019   Acute exacerbation of CHF (congestive heart failure) (HCC) 03/11/2019   Troponin level elevated 03/11/2019   Hyperlipidemia 03/03/2019   NSVT (nonsustained ventricular tachycardia) (HCC) 03/03/2019   Anxiety and depression    NSTEMI (non-ST elevated myocardial infarction) (HCC) 02/28/2019   Acute systolic heart failure (HCC) 11/08/2018   Hypertensive urgency 09/28/2018   Acute pulmonary edema (HCC) 09/28/2018   Cocaine abuse (HCC) 09/28/2018   Substance abuse (HCC) 09/28/2018   Chronic kidney disease, stage II (mild) 09/28/2018   Normochromic anemia 09/28/2018   Acute systolic CHF (congestive heart failure) (HCC) 09/27/2018   Essential hypertension 03/11/2016    Obesity (BMI 30-39.9) 03/11/2016   Glucosuria 03/11/2016   PCP:  Vesta Mixer, MD Pharmacy:   [facility] - [place], [place] - [address] [place] [address] Phone: [phone] Fax: [phone]  [facility] [address] [place] - [address] [place] [address] Phone: [phone] Fax: [phone]  [name] [address] [place] [address] Phone: [phone] Fax: [phone]     Social Determinants of Health (SDOH) Interventions    Readmission Risk Interventions Readmission Risk Prevention Plan 12/15/2020 09/11/2020 03/03/2019  Transportation Screening Complete Complete Complete  PCP or Specialist Appt within 3-5 Days - Complete -  HRI or Home Care Consult - Complete -  Social Work Consult for Recovery Care Planning/Counseling - Complete -  Palliative Care Screening - Complete -  Medication Review Oceanographer) Complete Complete Complete  PCP or Specialist appointment within 3-5 days of discharge Complete - Complete  HRI or Home Care Consult Complete - (No Data)  SW Recovery Care/Counseling Consult Complete - Complete  Palliative Care Screening Not Applicable - Not Applicable  Skilled Nursing Facility Not Applicable - Not Applicable  Some recent data might be hidden
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
## 2021-06-14 NOTE — Progress Notes (Signed)
OT Cancellation Note  Patient Details Name: Dale [name] MRN: [id number] DOB: Feb 12, 1961   Cancelled Treatment:    Reason Eval/Treat Not Completed: OT screened, no needs identified, will sign off (Plan is to go to drug rehab.)  Thornell Mule, OT/L   Acute OT Clinical Specialist Acute Rehabilitation Services Pager [phone] Office [phone]  06/14/2021, 3:03 PM

## 2021-06-15 DIAGNOSIS — Z515 Encounter for palliative care: Secondary | ICD-10-CM

## 2021-06-15 DIAGNOSIS — I5040 Unspecified combined systolic (congestive) and diastolic (congestive) heart failure: Secondary | ICD-10-CM | POA: Diagnosis not present

## 2021-06-15 LAB — BASIC METABOLIC PANEL
Anion gap: 10 (ref 5–15)
BUN: 45 mg/dL — ABNORMAL HIGH (ref 6–20)
CO2: 28 mmol/L (ref 22–32)
Calcium: 8.7 mg/dL — ABNORMAL LOW (ref 8.9–10.3)
Chloride: 101 mmol/L (ref 98–111)
Creatinine, Ser: 1.68 mg/dL — ABNORMAL HIGH (ref 0.61–1.24)
GFR, Estimated: 47 mL/min — ABNORMAL LOW (ref >60)
Glucose, Bld: 135 mg/dL — ABNORMAL HIGH (ref 70–99)
Potassium: 3.8 mmol/L (ref 3.5–5.1)
Sodium: 139 mmol/L (ref 135–145)

## 2021-06-15 MED ORDER — FUROSEMIDE 10 MG/ML IJ SOLN
80.0000 mg | Freq: Three times a day (TID) | INTRAMUSCULAR | Status: DC
  Administered 2021-06-15 – 2021-06-17 (×8): 80 mg via INTRAVENOUS
  Filled 2021-06-15 (×8): qty 8

## 2021-06-15 MED ORDER — DAPAGLIFLOZIN PROPANEDIOL 10 MG PO TABS
10.0000 mg | ORAL_TABLET | Freq: Every day | ORAL | Status: DC
  Administered 2021-06-15 – 2021-06-20 (×6): 10 mg via ORAL
  Filled 2021-06-15 (×8): qty 1

## 2021-06-15 MED ORDER — CARVEDILOL 3.125 MG PO TABS
3.1250 mg | ORAL_TABLET | Freq: Two times a day (BID) | ORAL | Status: DC
  Administered 2021-06-15 – 2021-06-21 (×12): 3.125 mg via ORAL
  Filled 2021-06-15 (×12): qty 1

## 2021-06-15 MED ORDER — SPIRONOLACTONE 25 MG PO TABS
25.0000 mg | ORAL_TABLET | Freq: Every day | ORAL | Status: DC
  Administered 2021-06-15 – 2021-06-21 (×7): 25 mg via ORAL
  Filled 2021-06-15 (×7): qty 1

## 2021-06-15 NOTE — Progress Notes (Signed)
Heart Failure Stewardship Pharmacist Progress Note   PCP: Nahser, Deloris Ping, MD PCP-Cardiologist: Kristeen Miss, MD    HPI:  60 yo M with PMH of CAD, CKD III, mixed ischemic and nonischemic cardiomyopathy, HTN, HLD, medication noncompliance, alcohol and cocaine abuse, and VT. He presented to the ED on 8/10 with chest pain, shortness of breath, LE edema, and reported noncompliance with torsemide. He states he ran out of his torsemide 3-4 days prior to admission. He was recently hospitalized on 7/29-8/2 at [facility] for acute systolic heart failure. He left AMA. Last ECHO done on 05/12/21 and LVEF was <10% with G2DD and severe RV systolic dysfunction. All prior ECHOs reporting back to 09/2018 have shown LVEF ~20% and less. UDS on admission positive for THC. Patient states last cocaine use was approximately 2 weeks ago. CXR showed no active cardiopulmonary disease. Last R/LHC done in July 2020 and found to have severe single vessel CAD with chronic total occlusion of proximal RCA (RA 17, PA 25, wedge 24, CO 7.7, CI 3.4).  Current HF Medications: Furosemide 80 mg IV BID Losartan 12.5 mg daily  Prior to admission HF Medications: Torsemide 40 mg daily Carvedilol 6.25 mg BID Losartan 12.5 mg daily  Pertinent Lab Values: Serum creatinine 1.68, BUN 45, Potassium 3.8, Sodium 139, BNP 2895.8, Magnesium 2.5  A1c 6.1 (05/11/21) I/O: Net -3.5L since admission   Vital Signs: Weight: 279 lbs (admission weight: 275 lbs) Blood pressure: 100-140/80s  Heart rate: 70-80s   Medication Assistance / Insurance Benefits Check: Does the patient have prescription insurance?  Yes Type of insurance plan: [place] Medicaid  Outpatient Pharmacy:  [facility] Is the patient willing to use [facility] TOC pharmacy at discharge? Yes Is the patient willing to transition their outpatient pharmacy to utilize a [facility] outpatient pharmacy?   Pending    Assessment: 1. Acute on chronic systolic CHF (EF  <10%), due to mixed ischemic and nonischemic cardiomyopathy. NYHA class III symptoms. - Continue furosemide 80 mg IV BID. Weight up 3lbs today. 3L out yesterday. - Continue to hold carvedilol  - Losartan 12.5 mg resumed. SCr improved from 2.07>1.68 (baseline ~1.5). Consider transitioning to [facility] prior to discharge - Consider starting spironolactone and SGLT2i prior to discharge   Plan: 1) Medication changes recommended at this time: - Continue IV diuresis  2) Patient assistance: - Patient has [place] Medicaid - all copays should be $0-4 per month and copays can be waived at [facility] pharmacies if he cannot afford his prescriptions - HF TOC appt scheduled for 8/22 - Appreciate assistance from HF CSW and RNCM to assist with SDOH needs  3)  Education  - To be completed prior to discharge  Sharen Hones, PharmD, BCPS Heart Failure Stewardship Pharmacist Phone [phone]

## 2021-06-15 NOTE — Progress Notes (Signed)
Progress Note  Patient Name: Dale [name] Date of Encounter: 06/15/2021  [facility] Cardiologist: Kristeen Miss, MD    Subjective    59 yo with CHF, CAD , CKD, medication noncompliance, alcohol and cocaine abuse, VT Admitted yesterday with CHF Has been noncompliant with torsemide  Was recently in [facility], left AMA   Has diuresed 3.5 liters so far this admission    He has significant compliance issues. He likely will need to multi-disciplinary approach offered in the Advnaced CHF clinic . Will explore that with the CHF team    Has been seen by palliative care. He is a DNR now   Inpatient Medications    Scheduled Meds:  aspirin EC  81 mg Oral Daily   atorvastatin  40 mg Oral Daily   enoxaparin (LOVENOX) injection  60 mg Subcutaneous Q24H   furosemide  80 mg Intravenous BID   gabapentin  300 mg Oral QHS   losartan  12.5 mg Oral Daily   potassium chloride SA  20 mEq Oral Daily   sodium chloride flush  3 mL Intravenous Q12H   Continuous Infusions:  sodium chloride     PRN Meds: sodium chloride, acetaminophen, diclofenac sodium, melatonin, ondansetron (ZOFRAN) IV, sodium chloride flush   Vital Signs    Vitals:   06/15/21 0112 06/15/21 0300 06/15/21 0422 06/15/21 0748  BP: (!) 147/119  100/68 115/82  Pulse: 80  80 86  Resp: 16  16 20   Temp: 97.7 F (36.5 C)  (!) 97.3 F (36.3 C) 98.2 F (36.8 C)  TempSrc: Oral  Oral Oral  SpO2: 95%  97% 100%  Weight:  126.6 kg    Height:        Intake/Output Summary (Last 24 hours) at 06/15/2021 0915 Last data filed at 06/15/2021 0335 Gross per 24 hour  Intake 815 ml  Output 1700 ml  Net -885 ml    Last 3 Weights 06/15/2021 06/14/2021 06/13/2021  Weight (lbs) 279 lb 3.2 oz 276 lb 9.6 oz 275 lb 2.2 oz  Weight (kg) 126.644 kg 125.465 kg 124.8 kg      Telemetry    Artifact - Personally Reviewed  ECG       Physical Exam   Physical Exam: Blood pressure 115/82, pulse 86, temperature 98.2 F (36.8 C),  temperature source Oral, resp. rate 20, height 5\' 11"  (1.803 m), weight 126.6 kg, SpO2 100 %.  GEN:  middle age, morbidly obese man,  NAD  HEENT: Normal NECK: No JVD; No carotid bruits LYMPHATICS: No lymphadenopathy CARDIAC: RRR   RESPIRATORY:  Clear to auscultation without rales, wheezing or rhonchi  ABDOMEN: Soft, non-tender, non-distended MUSCULOSKELETAL:  2-3 + edema bilaterally  SKIN: Warm and dry NEUROLOGIC:  Alert and oriented x 3   Labs    High Sensitivity Troponin:   Recent Labs  Lab 06/01/21 2000 06/01/21 2153 06/13/21 0057 06/13/21 0250  TROPONINIHS 1,279* 1,127* 28* 26*       Chemistry Recent Labs  Lab 06/13/21 0057 06/14/21 0346  NA 137 138  K 3.7 3.8  CL 105 101  CO2 21* 24  GLUCOSE 108* 110*  BUN 49* 53*  CREATININE 1.88* 2.07*  CALCIUM 9.0 8.9  GFRNONAA 41* 37*  ANIONGAP 11 13      Hematology Recent Labs  Lab 06/13/21 0057  WBC 6.2  RBC 4.56  HGB 12.8*  HCT 38.9*  MCV 85.3  MCH 28.1  MCHC 32.9  RDW 16.7*  PLT 274     BNP  Recent Labs  Lab 06/13/21 0057  BNP 2,895.8*      DDimer No results for input(s): DDIMER in the last 168 hours.   Radiology    No results found.  Cardiac Studies     Patient Profile     60 y.o. male with hx of cocaine abuse,  CHF   Assessment & Plan     Acute on chronic combined CHF:   complicated by cocaine abuse and medication noncompliance  Will be following up with the CHF team.  He will need several more days of IV diuresis. Will need his DC meds sent down to the [facility] pharmacy at the time of DC .      For questions or updates, please contact [facility] Please consult [email or web address] for contact info under        Signed, Kristeen Miss, MD  06/15/2021, 9:15 AM

## 2021-06-15 NOTE — Progress Notes (Addendum)
CSW followed up with [name] from the conversation yesterday. [name] is aware of Advanced HF follow up appointment and is enrolled in [facility] transportation to get him to this appointment and the CSW provided the patient with their number. In March the Advanced HF CSW was able to get [name] into rehab until April and [name] reported he left rehab on April 6th and relapsed on April 8th. HF team previously provided [name] with a cell phone which he states he lost and left behind in an [place]. CSW provided [name] with information about getting a free government phone and that the [facility] will help with filling out an application if needed. [name] became frustrated with the CSW and accused the CSW of lying to him. CSW provided numerous resources to [name] including resources for the [employer], [place], shelter resources, substance use resources, [facility], [employer], Catering manager. [name] eventually apologized to the CSW.

## 2021-06-15 NOTE — Progress Notes (Signed)
PROGRESS NOTE    Dale [name]  DJM:[id number] DOB: 1960-11-14 DOA: 06/13/2021 PCP: Vesta Mixer, MD    Brief Narrative:  Dale [name] was admitted to the hospital working diagnosis of acute decompensated systolic heart failure.   60 year old male past medical history for hypertension, dyslipidemia, systolic heart failure, chronic kidney disease, cocaine abuse, history of ventricular tachycardia who presented with dyspnea and lower extremity edema.  Apparently ran out of his diuretic therapy 2 to 3 days ago.  Developing worsening dyspnea to the point where he became symptomatic with minimal efforts.  Severe lower extremity edema.  On his initial physical examination blood pressure 123/106, heart rate 76, respiratory 22, oxygen saturation 98%, his lungs had decreased breath sounds bilaterally, heart S1-S2, present, rhythmic, soft abdomen, positive 2-3 + bilateral extremity edema.  NA 137, K 3,7, CL 105, bicarb 21, glucose 108, BUN 49, cr 1,88, BNP 2,895, Troponin I 28-26. WBC 6,2, hgb 12,8, hct 38.9, Plt 274 COVID 19 negative  Chest radiograph with cardiomegaly, bilateral hilar vascular congestion and bilateral small pleural effusions.   EKG 71 bpm, left axis deviation, 1st degree AV block, manually corrected QTC 489, sinus rhythm, PVC, no significant ST segment or T wave changes.    Patient has been placed on aggressive diuresis with intravenous furosemide.   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 LV EF less than 10% per echocardiogram on 05/2021.  Positive hypervolemia, urine output  over last 24 hrs 1,700 ml, blood pressure systolic 109, warm lower extremities.   Plan to continue diuresis with IV furosemide, will increase dose to 80 mg TID to target further negative fluid balance.  Continue with losartan 25 mg and resume low dose carvedilol. Add spironolactone 25 mg daily and dapagliflozin  Plan to start entresto before discharge.    2. AKI on CKD stage 3a. hypokalemia Persistent hypervolemia. Renal function with serum cr at   1,68 with K at 3,8 and serum bicarbonate at 28. Continue close follow up on renal function and electrolytes.  Continue K cl supplements   Anemia of chronic renal disease. Hgb stable    3. HTN/ dyslipidemia . On losartan, resume carvedilol. Continue close follow up on blood pressure during aggressive diuresis.     On statin therapy    4. Polysubstance abuse/ depression. No clinical sings of withdrawal.  Patient not suicidal, but would like to have a psychiatric evaluation during this hospitalization    5. Obesity class 2. Calculated BMI is 38,5    Patient continue to be at high risk for worsening heart failure   Status is: Inpatient  Remains inpatient appropriate because:IV treatments appropriate due to intensity of illness or inability to take PO  Dispo: The patient is from: Home              Anticipated d/c is to: Home              Patient currently is not medically stable to d/c.   Difficult to place patient No   DVT prophylaxis: Enoxaparin   Code Status:   DNR Family Communication:  no  family at the bedside       Consultants:  Cardiology    Subjective: Patient with no nausea or vomiting, continue to have dyspnea and persistent lower extremity edema.   Objective: Vitals:   06/15/21 0112 06/15/21 0300 06/15/21 0422 06/15/21 0748  BP: (!) 147/119  100/68 115/82  Pulse: 80  80 86  Resp: 16  16 20   Temp: 97.7 F (36.5 C)  (!) 97.3 F (36.3 C) 98.2 F (36.8 C)  TempSrc: Oral  Oral Oral  SpO2: 95%  97% 100%  Weight:   126.6 kg    Height:        Intake/Output Summary (Last 24 hours) at 06/15/2021 1315 Last data filed at 06/15/2021 1212 Gross per 24 hour  Intake 935 ml  Output 3050 ml  Net -2115 ml   Filed Weights   06/13/21 1849 06/14/21 0450 06/15/21 0300  Weight: 124.8 kg 125.5 kg 126.6 kg    Examination:   General: Not in pain or dyspnea, deconditioned  Neurology: Awake and alert, non focal  E ENT: no pallor, no icterus, oral mucosa moist Cardiovascular: No JVD. S1-S2 present, rhythmic, no gallops, rubs, or murmurs. +++ pitting bilateral lower extremity edema. Pulmonary: positive breath sounds bilaterally, with no wheezing, or rhonchi, bus scattered rales. Gastrointestinal. Abdomen protuberant  Skin. No rashes Musculoskeletal: no joint deformities     Data Reviewed: I have personally reviewed following labs and imaging studies  CBC: Recent Labs  Lab 06/13/21 0057  WBC 6.2  HGB 12.8*  HCT 38.9*  MCV 85.3  PLT 274   Basic Metabolic Panel: Recent Labs  Lab 06/13/21 0057 06/14/21 0346 06/14/21 0646 06/15/21 0825  NA 137 138  --  139  K 3.7 3.8  --  3.8  CL 105 101  --  101  CO2 21* 24  --  28  GLUCOSE 108* 110*  --  135*  BUN 49* 53*  --  45*  CREATININE 1.88* 2.07*  --  1.68*  CALCIUM 9.0 8.9  --  8.7*  MG  --   --  2.5*  --    GFR: Estimated Creatinine Clearance: 65.7 mL/min (A) (by C-G formula based on SCr of 1.68 mg/dL (H)). Liver Function Tests: No results for input(s): AST, ALT, ALKPHOS, BILITOT, PROT, ALBUMIN in the last 168 hours. No results for input(s): LIPASE, AMYLASE in the last 168 hours. No results for input(s): AMMONIA in the last 168 hours. Coagulation Profile: No results for input(s): INR, PROTIME in the last 168 hours. Cardiac Enzymes: No results for input(s): CKTOTAL, CKMB, CKMBINDEX, TROPONINI in the last 168 hours. BNP (last 3 results) No results for input(s): PROBNP in the last 8760 hours. HbA1C: No results for input(s): HGBA1C in the last  72 hours. CBG: No results for input(s): GLUCAP in the last 168 hours. Lipid Profile: No results for input(s): CHOL, HDL, LDLCALC, TRIG, CHOLHDL, LDLDIRECT in the last 72 hours. Thyroid Function Tests: Recent Labs    06/13/21 1939  TSH 1.541   Anemia Panel: No results for input(s): VITAMINB12, FOLATE, FERRITIN, TIBC, IRON, RETICCTPCT in the last 72 hours.    Radiology Studies: I have reviewed all of the imaging during this hospital visit personally     Scheduled Meds:  aspirin EC  81 mg Oral Daily   atorvastatin  40 mg Oral Daily   enoxaparin (LOVENOX) injection  60 mg Subcutaneous Q24H   furosemide  80 mg Intravenous BID   gabapentin  300 mg Oral QHS  losartan  12.5 mg Oral Daily   potassium chloride SA  20 mEq Oral Daily   sodium chloride flush  3 mL Intravenous Q12H   Continuous Infusions:  sodium chloride       LOS: 2 days        Rebecka Oelkers Annett Gula, MD

## 2021-06-15 NOTE — TOC Progression Note (Signed)
Transition of Care [facility]) - Progression Note    Patient Details  Name: Dale [name] MRN: [id number] Date of Birth: [birth year]  Transition [facility] CM/SW Contact  Ivette Loyal, [place] Phone Number: 06/15/2021, 3:58 PM  Clinical Narrative:    CSW spoke with pt about resources he was given yesterday. Pt asked CSW to call all facilities for him. CSW informed pt that the resources were for him to call. Pt is oriented x4 and is able to call the resources. CSW will follow up with pt for further resources.    Expected Discharge Plan: Home/Self Care Barriers to Discharge: Continued Medical Work up  Expected Discharge Plan and Services Expected Discharge Plan: Home/Self Care In-house Referral: Clinical Social Work Discharge Planning Services: CM Consult Post Acute Care Choice: Durable Medical Equipment Living arrangements for the past 2 months: Hotel/Motel                 DME Arranged: Walker rolling with seat DME Agency: [employer] Date DME Agency Contacted: 06/14/21 Time DME Agency Contacted: 1118 Representative spoke with at DME Agency: Violet Baldy             Social Determinants of Health (SDOH) Interventions Food Insecurity Interventions: Assist with [employer] Application Financial Strain Interventions: Artist Housing Interventions: Other (Comment), [id number] Referral Transportation Interventions: [facility]  Readmission Risk Interventions Readmission Risk Prevention Plan 12/15/2020 09/11/2020 03/03/2019  Transportation Screening Complete Complete Complete  PCP or Specialist Appt within 3-5 Days - Complete -  HRI or Home Care Consult - Complete -  Social Work Consult for Recovery Care Planning/Counseling - Complete -  Palliative Care Screening - Complete -  Medication Review Oceanographer) Complete Complete Complete  PCP or Specialist appointment within 3-5 days of discharge Complete - Complete  HRI or Home Care Consult Complete -  (No Data)  SW Recovery Care/Counseling Consult Complete - Complete  Palliative Care Screening Not Applicable - Not Applicable  Skilled Nursing Facility Not Applicable - Not Applicable  Some recent data might be hidden

## 2021-06-15 NOTE — Progress Notes (Addendum)
This chaplain attempted to respond to PMT consult for spiritual care and creating/updating the Pt. Advance Directive.  The Pt. is with a healthcare team member at the time of the visit.  This chaplain will attempt a revisit.

## 2021-06-15 NOTE — Consult Note (Signed)
Palliative Medicine Inpatient Consult Note  Consulting Provider: Norval Morton, MD  Reason for consult:   Palliative Care Consult Services Symptom Management Consult  Reason for Consult? end stage HF   HPI:  Per intake H&P --> 60 year old male past medical history for hypertension, dyslipidemia, systolic heart failure, chronic kidney disease, cocaine abuse, history of ventricular tachycardia who presented with dyspnea and lower extremity edema.  Apparently ran out of his diuretic therapy 2 to 3 days ago.  Developing worsening dyspnea to the point where he became symptomatic with minimal efforts.  Severe lower extremity edema.  On his initial physical examination blood pressure 123/106, heart rate 76, respiratory 22, oxygen saturation 98%, his lungs had decreased breath sounds bilaterally, heart S1-S2, present, rhythmic, soft abdomen, positive 2-3 + bilateral extremity edema. Patient has been placed on aggressive diuresis with intravenous furosemide.  He was recently seen at [facility] by the Palliative care team, he was at this time confirmed to want all interventions to sustain life.   Clinical Assessment/Goals of Care:  *Please note that this is a verbal dictation therefore any spelling or grammatical errors are due to the "[place] One" system interpretation.  I have reviewed medical records including EPIC notes, labs and imaging, received report from bedside RN, assessed the patient who is sitting on the edge of bed eating his breakfast.    I met with Dale Lenis. Rafferty to further discuss diagnosis prognosis, GOC, EOL wishes, disposition and options.  Dale Moll I discussed his past medical history inclusive of his severe systolic heart failure with an EF of less than 10%.  He shares with me "I know I have a bad heart".  He reviews that he is going to try to get his life back on track and that he has since 10 days ago stopped using cocaine.   I introduced Palliative Medicine as specialized  medical care for people living with serious illness. It focuses on providing relief from the symptoms and stress of a serious illness. The goal is to improve quality of life for both the patient and the family.  Danford shares with me that he is from [place] [place].  He shares that he comes from a simpler generation of farmers, hunters and fishers.  He expresses that he is separated from his former wife though they do share 2 children Dale Ina and Dale Bake.  He has 1 grandson.  He has been on disability for many years now. 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 He was at [facility] for a time period and exposed to the water contamination. He shares with me that he is a man of faith and has a personal relationship with the [place].    We reviewed that [name] has been homeless for many years.  He has had a girlfriend, Blanchie Serve with whom he has lived with in the "In town suites" for the past 5 years on and off.  Shares that their relationship has been tumultuous and of more recently she had sold their vehicle for which they were using to clean cars in the [place] area, she stole his credit card, and she stole his food stamps.  Regarding his addiction history [place] shares that he has been on and off drugs for the majority of his adult life.  He expresses that after his hospitalization in March he was trying to remain clean though he relapsed  on April 8 after his grandson's birthday.  He shares that he knows his addiction is likely going to kill him and he is trying to "get away" from the people who contribute to it inclusive of his former girlfriend.  A detailed discussion was had today regarding advanced directives -has never completed these though he would wish for his daughter Dale Antonini to be his primary decision maker.    Concepts specific to code status, artifical feeding and hydration, continued IV  antibiotics and rehospitalization was had.  A MOST form was introduced and completed with the following wishes.  Cardiopulmonary Resuscitation: Do Not Attempt Resuscitation (DNR/No CPR)  Medical Interventions: Limited Additional Interventions: Use medical treatment, IV fluids and cardiac monitoring as indicated, DO NOT USE intubation or mechanical ventilation. May consider use of less invasive airway support such as BiPAP or CPAP. Also provide comfort measures. Transfer to the hospital if indicated. Avoid intensive care.   Antibiotics: Determine use of limitation of antibiotics when infection occurs  IV Fluids: IV fluids for a defined trial period  Feeding Tube: Feeding tube for a defined trial period   The difference between a aggressive medical intervention path  and a palliative comfort care path for this patient at this time was had. Right now, [name] if hopeful to get to transitional housing. He shares that he wants to improve his present condition if possible. He believes part of this would be to involve a therapist to discuss his past traumas with. We discussed psychiatry seeing him in the hospital which we has requested.   I was able to provide a safe space for [name] to address his various concerns. He told me about his very difficult youth and how he got to where he is in his addiction. I provided support through therapeutic listening.   Discussed the importance of continued conversation with family and their  medical providers regarding overall plan of care and treatment options, ensuring decisions are within the context of the patients values and GOCs.  Decision Maker: Patient can presently make decisions for himself.  SUMMARY OF RECOMMENDATIONS   DNAR/DNI  MOST Completed, paper copy placed onto the chart electric copy can be found in [facility]  DNR Form Completed, paper copy placed onto the chart electric copy can be found in Dale [name] helping with Advance  Directives  Patient has requested Psychiatry  Will asked MSW to review patients VA benefits as they may be able to provide resources for him  Ongoing PMT support  Code Status/Advance Care Planning: DNAR/DNI   Palliative Prophylaxis:  Oral Care, Mobility   Additional Recommendations (Limitations, Scope, Preferences): Treat what is treatable   Psycho-social/Spiritual:  Desire for further Chaplaincy support:  Yes Additional Recommendations: Education on end stage heart failure   Prognosis: Very poor in the setting of severe heart disease.  Discharge Planning: Will need to go to a transitional home   Vitals:   06/15/21 0112 06/15/21 0422  BP: (!) 147/119 100/68  Pulse: 80 80  Resp: 16 16  Temp: 97.7 F (36.5 C) (!) 97.3 F (36.3 C)  SpO2: 95% 97%    Intake/Output Summary (Last 24 hours) at 06/15/2021 0651 Last data filed at 06/15/2021 0335 Gross per 24 hour  Intake 1115 ml  Output 1700 ml  Net -585 ml   Last Weight  Most recent update: 06/15/2021  3:24 AM    Weight  126.6 kg (279 lb 3.2 oz)  Gen:  Middle aged [place] M in NAD HEENT: moist mucous membranes CV: Regular rate and rhythm  PULM: On 2LPM [place] ABD: soft/nontender  EXT: (+) BLE edema Neuro: Alert and oriented x3   PPS: 60%   This conversation/these recommendations were discussed with patient primary care team, Dr. Cathlean Sauer  Time In: 6773 Time Out: 1320 Total Time: 85 Greater than 50%  of this time was spent counseling and coordinating care related to the above assessment and plan.  [place] Team Team Cell Phone: [phone] Please utilize secure chat with additional questions, if there is no response within 30 minutes please call the above phone number  Palliative Medicine Team providers are available by phone from 7am to 7pm daily and can be reached through the team cell phone.  Should this patient require assistance outside of these hours, please  call the patient's attending physician.

## 2021-06-16 DIAGNOSIS — I5040 Unspecified combined systolic (congestive) and diastolic (congestive) heart failure: Secondary | ICD-10-CM | POA: Diagnosis not present

## 2021-06-16 LAB — BASIC METABOLIC PANEL
Anion gap: 9 (ref 5–15)
BUN: 41 mg/dL — ABNORMAL HIGH (ref 6–20)
CO2: 27 mmol/L (ref 22–32)
Calcium: 8.6 mg/dL — ABNORMAL LOW (ref 8.9–10.3)
Chloride: 101 mmol/L (ref 98–111)
Creatinine, Ser: 1.52 mg/dL — ABNORMAL HIGH (ref 0.61–1.24)
GFR, Estimated: 53 mL/min — ABNORMAL LOW (ref >60)
Glucose, Bld: 87 mg/dL (ref 70–99)
Potassium: 3.7 mmol/L (ref 3.5–5.1)
Sodium: 137 mmol/L (ref 135–145)

## 2021-06-16 LAB — MAGNESIUM: Magnesium: 1.9 mg/dL (ref 1.7–2.4)

## 2021-06-16 MED ORDER — LOSARTAN POTASSIUM 25 MG PO TABS
25.0000 mg | ORAL_TABLET | Freq: Every day | ORAL | Status: DC
  Administered 2021-06-16: 25 mg via ORAL
  Filled 2021-06-16: qty 1

## 2021-06-16 NOTE — Progress Notes (Signed)
Progress Note  Patient Name: Dale [name] Date of Encounter: 06/16/2021  [facility] Cardiologist: Kristeen Miss, MD   Subjective   No CP or dyspnea  Inpatient Medications    Scheduled Meds:  aspirin EC  81 mg Oral Daily   atorvastatin  40 mg Oral Daily   carvedilol  3.125 mg Oral BID WC   dapagliflozin propanediol  10 mg Oral Daily   enoxaparin (LOVENOX) injection  60 mg Subcutaneous Q24H   furosemide  80 mg Intravenous TID   gabapentin  300 mg Oral QHS   losartan  12.5 mg Oral Daily   potassium chloride SA  20 mEq Oral Daily   sodium chloride flush  3 mL Intravenous Q12H   spironolactone  25 mg Oral Daily   Continuous Infusions:  sodium chloride     PRN Meds: sodium chloride, acetaminophen, diclofenac sodium, melatonin, ondansetron (ZOFRAN) IV, sodium chloride flush   Vital Signs    Vitals:   06/15/21 2025 06/16/21 0056 06/16/21 0333 06/16/21 0358  BP: 114/87 101/71  117/89  Pulse: 90 85  82  Resp: 16 16  18   Temp: 98 F (36.7 C) 98.5 F (36.9 C)  97.7 F (36.5 C)  TempSrc: Oral Oral  Oral  SpO2: 99% 100%  100%  Weight:   126.5 kg   Height:        Intake/Output Summary (Last 24 hours) at 06/16/2021 0754 Last data filed at 06/16/2021 0334 Gross per 24 hour  Intake 1930 ml  Output 6000 ml  Net -4070 ml   Last 3 Weights 06/16/2021 06/15/2021 06/14/2021  Weight (lbs) 278 lb 14.4 oz 279 lb 3.2 oz 276 lb 9.6 oz  Weight (kg) 126.508 kg 126.644 kg 125.465 kg      Telemetry    Sinus with PVCs and 8 beats NSVT- Personally Reviewed   Physical Exam   GEN: No acute distress.   Neck: JVP 12 cm Cardiac: RRR, no murmurs, rubs, or gallops.  Respiratory: Clear to auscultation bilaterally. GI: Soft, nontender, non-distended  MS: 3+ edema Neuro:  Nonfocal  Psych: Normal affect   Labs    High Sensitivity Troponin:   Recent Labs  Lab 06/01/21 2000 06/01/21 2153 06/13/21 0057 06/13/21 0250  TROPONINIHS 1,279* 1,127* 28* 26*       Chemistry Recent Labs  Lab 06/14/21 0346 06/15/21 0825 06/16/21 0424  NA 138 139 137  K 3.8 3.8 3.7  CL 101 101 101  CO2 24 28 27   GLUCOSE 110* 135* 87  BUN 53* 45* 41*  CREATININE 2.07* 1.68* 1.52*  CALCIUM 8.9 8.7* 8.6*  GFRNONAA 37* 47* 53*  ANIONGAP 13 10 9      Hematology Recent Labs  Lab 06/13/21 0057  WBC 6.2  RBC 4.56  HGB 12.8*  HCT 38.9*  MCV 85.3  MCH 28.1  MCHC 32.9  RDW 16.7*  PLT 274    BNP Recent Labs  Lab 06/13/21 0057  BNP 2,895.8*      Patient Profile     60 y.o. male with past medical history of coronary artery disease, chronic stage III kidney disease, mixed nonischemic/ischemic cardiomyopathy, hypertension, hyperlipidemia, substance abuse, noncompliance, ventricular tachycardia admitted with acute on chronic combined systolic/diastolic congestive heart failure.  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  Last echocardiogram showed ejection fraction less than 10%, severe left ventricular enlargement, grade 2 diastolic dysfunction, severe right ventricular enlargement with severe RV dysfunction, severe  biatrial enlargement, mild mitral regurgitation, mild to moderate tricuspid regurgitation.  Assessment & Plan    1 acute on chronic combined systolic/diastolic congestive heart failure-weight-126.5 kg I/O since admission -4081.  Patient remains significantly volume overloaded.  He appears to be diuresing well.  We will continue Lasix at present dose.  Follow renal function closely.  2 mixed ischemic/nonischemic cardiomyopathy-we will increase losartan to 25 mg daily.  Not clear to me that he will tolerate Entresto from a blood pressure standpoint.  We will advance carvedilol later as CHF improves.  Continue Farxiga.  Patient is not a candidate for ICD given ongoing substance abuse and noncompliance.  3 chronic stage IIIa kidney disease-follow renal  function closely with diuresis.  4 noncompliance-we discussed importance of complying with medications.  He will likely need follow-up in the advanced heart failure clinic after discharge.  5 history of substance abuse-needs to discontinue.  6 coronary artery disease-continue aspirin and statin.  For questions or updates, please contact [facility] Please consult [email or web address] for contact info under        Signed, Olga Millers, MD  06/16/2021, 7:54 AM

## 2021-06-16 NOTE — Progress Notes (Signed)
PROGRESS NOTE    Day Deery  JJH:[id number] DOB: 09-07-1961 DOA: 06/13/2021 PCP: Vesta Mixer, MD    Brief Narrative:  Mr. Hanners was admitted to the hospital working diagnosis of acute decompensated systolic heart failure.   59 year old male past medical history for hypertension, dyslipidemia, systolic heart failure, chronic kidney disease, cocaine abuse, history of ventricular tachycardia who presented with dyspnea and lower extremity edema.  Apparently ran out of his diuretic therapy 2 to 3 days ago.  Developing worsening dyspnea to the point where he became symptomatic with minimal efforts.  Severe lower extremity edema.  On his initial physical examination blood pressure 123/106, heart rate 76, respiratory 22, oxygen saturation 98%, his lungs had decreased breath sounds bilaterally, heart S1-S2, present, rhythmic, soft abdomen, positive 2-3 + bilateral extremity edema.   NA 137, K 3,7, CL 105, bicarb 21, glucose 108, BUN 49, cr 1,88, BNP 2,895, Troponin I 28-26. WBC 6,2, hgb 12,8, hct 38.9, Plt 274 COVID 19 negative   Chest radiograph with cardiomegaly, bilateral hilar vascular congestion and bilateral small pleural effusions.    EKG 71 bpm, left axis deviation, 1st degree AV block, manually corrected QTC 489, sinus rhythm, PVC, no significant ST segment or T wave changes.    Patient has been placed on aggressive diuresis with intravenous furosemide.  Continue to have significant volume overload and requiring high doses of furosemide.    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 LV EF less than 10% per echocardiogram on 05/2021.  Continue to have volume overload, his urine output over last 24 hrs is 6000 ml, continue to have warm extremities, but significant edema and positive JVD, his orthopnea has improved.    Continue diuresis with furosemide 80 mg IV q8 hrs.  Heart failure management with Losartan, carvedilol, spironolactone and dapagliflozin  I      2. AKI on CKD stage 3a. hypokalemia  Patient responding to diuresis with furosemide but continue to be volume overloaded.  Renal function with serum cr at 1,52, with K at 3,7 and serum bicarbonate at 27.  Follow up renal function in am and continue K supplementation  Check Mg in am.  Hgb and hct stable.    3. HTN/ dyslipidemia . Blood pressure systolic has been 101 to 117, this am had a elevated diastolic 144, not clear if it is accurate. Continue to follow trend in blood pressure. Continue with losartan and carvedilol.    Continue with statin therapy    4. Polysubstance abuse/ depression. No sings of withdrawal.  Patient not suicidal, but would like to have a psychiatric evaluation during this hospitalization   Plan to consult psych on Monday    5. Obesity class 2. Calculated BMI is 38,5    Patient continue to be at high risk for worsening heart failure   Status is: Inpatient  Remains inpatient appropriate because:IV treatments appropriate due to intensity of illness or inability to take PO  Dispo: The patient is from: Home              Anticipated d/c  is to: Home              Patient currently is not medically stable to d/c.   Difficult to place patient No   DVT prophylaxis: Enoxaparin   Code Status:   full  Family Communication:  No family at the bedside      Consultants:  Cardiology   Subjective: Patient reports improvement in orthopnea, but continue to have significant painful lower extremity edema,   Objective: Vitals:   06/16/21 0056 06/16/21 0333  06/16/21 0358 06/16/21 1100  BP: 101/71  117/89 (!) 155/144  Pulse: 85  82 77  Resp: 16  18 19   Temp: 98.5 F (36.9 C)  97.7 F (36.5 C) 97.6 F (36.4 C)  TempSrc: Oral  Oral Oral  SpO2: 100%  100% 98%  Weight:  126.5 kg    Height:        Intake/Output Summary (Last 24 hours) at 06/16/2021 1145 Last data filed at 06/16/2021 1053 Gross per 24 hour  Intake 2162 ml  Output 5800 ml  Net -3638 ml   Filed Weights   06/14/21 0450 06/15/21 0300 06/16/21 0333  Weight: 125.5 kg 126.6 kg 126.5 kg    Examination:   General: Not in pain or dyspnea, deconditioned  Neurology: Awake and alert, non focal  E ENT: no pallor, no icterus, oral mucosa moist Cardiovascular: positive JVD. S1-S2 present, rhythmic, no gallops, rubs, or murmurs. +++ pitting bilateral lower extremity edema. Pulmonary: positive breath sounds bilaterally, with no wheezing. Gastrointestinal. Abdomen soft and non tender Skin. No rashes Musculoskeletal: no joint deformities     Data Reviewed: I have personally reviewed following labs and imaging studies  CBC: Recent Labs  Lab 06/13/21 0057  WBC 6.2  HGB 12.8*  HCT 38.9*  MCV 85.3  PLT 274   Basic Metabolic Panel: Recent Labs  Lab 06/13/21 0057 06/14/21 0346 06/14/21 0646 06/15/21 0825 06/16/21 0424  NA 137 138  --  139 137  K 3.7 3.8  --  3.8 3.7  CL 105 101  --  101 101  CO2 21* 24  --  28 27  GLUCOSE 108* 110*  --  135* 87  BUN 49* 53*  --  45* 41*  CREATININE 1.88* 2.07*  --  1.68* 1.52*  CALCIUM 9.0 8.9  --  8.7* 8.6*  MG  --   --  2.5*  --  1.9   GFR: Estimated Creatinine Clearance: 72.7 mL/min (A) (by C-G formula based on SCr of 1.52 mg/dL (H)). Liver Function Tests: No results for input(s): AST, ALT, ALKPHOS, BILITOT, PROT, ALBUMIN in the last 168 hours. No results for input(s): LIPASE, AMYLASE in the last 168 hours. No results for input(s): AMMONIA in the last 168 hours. Coagulation Profile: No results for input(s): INR, PROTIME in  the last 168 hours. Cardiac Enzymes: No results for input(s): CKTOTAL, CKMB, CKMBINDEX, TROPONINI in the last 168 hours. BNP (last 3 results) No results for input(s): PROBNP in the last 8760 hours. HbA1C: No results for input(s): HGBA1C in the last 72 hours. CBG: No results for input(s): GLUCAP in the last 168 hours. Lipid Profile: No results for input(s): CHOL, HDL, LDLCALC, TRIG, CHOLHDL, LDLDIRECT in the last 72 hours. Thyroid Function Tests: Recent Labs    06/13/21 1939  TSH 1.541   Anemia Panel: No results for input(s): VITAMINB12, FOLATE, FERRITIN, TIBC, IRON, RETICCTPCT in the last 72 hours.    Radiology Studies: I have reviewed all of the imaging during this  hospital visit personally     Scheduled Meds:  aspirin EC  81 mg Oral Daily   atorvastatin  40 mg Oral Daily   carvedilol  3.125 mg Oral BID WC   dapagliflozin propanediol  10 mg Oral Daily   enoxaparin (LOVENOX) injection  60 mg Subcutaneous Q24H   furosemide  80 mg Intravenous TID   gabapentin  300 mg Oral QHS   losartan  25 mg Oral Daily   potassium chloride SA  20 mEq Oral Daily   sodium chloride flush  3 mL Intravenous Q12H   spironolactone  25 mg Oral Daily   Continuous Infusions:  sodium chloride       LOS: 3 days        Kylii Ennis Annett Gula, MD

## 2021-06-17 DIAGNOSIS — I5040 Unspecified combined systolic (congestive) and diastolic (congestive) heart failure: Secondary | ICD-10-CM | POA: Diagnosis not present

## 2021-06-17 DIAGNOSIS — Z515 Encounter for palliative care: Secondary | ICD-10-CM

## 2021-06-17 LAB — BASIC METABOLIC PANEL
Anion gap: 9 (ref 5–15)
BUN: 35 mg/dL — ABNORMAL HIGH (ref 6–20)
CO2: 25 mmol/L (ref 22–32)
Calcium: 8.6 mg/dL — ABNORMAL LOW (ref 8.9–10.3)
Chloride: 102 mmol/L (ref 98–111)
Creatinine, Ser: 1.34 mg/dL — ABNORMAL HIGH (ref 0.61–1.24)
GFR, Estimated: >60 mL/min (ref >60)
Glucose, Bld: 104 mg/dL — ABNORMAL HIGH (ref 70–99)
Potassium: 3.8 mmol/L (ref 3.5–5.1)
Sodium: 136 mmol/L (ref 135–145)

## 2021-06-17 LAB — MAGNESIUM: Magnesium: 2 mg/dL (ref 1.7–2.4)

## 2021-06-17 MED ORDER — LOSARTAN POTASSIUM 50 MG PO TABS
50.0000 mg | ORAL_TABLET | Freq: Every day | ORAL | Status: DC
  Administered 2021-06-17: 50 mg via ORAL
  Filled 2021-06-17: qty 1

## 2021-06-17 NOTE — Progress Notes (Addendum)
Palliative Medicine Inpatient Follow Up Note  Consulting Provider: Norval Morton, MD   Reason for consult:   Palliative Care Consult Services Symptom Management Consult  Reason for Consult? end stage HF    HPI:  Per intake H&P --> 60 year old male past medical history for hypertension, dyslipidemia, systolic heart failure, chronic kidney disease, cocaine abuse, history of ventricular tachycardia who presented with dyspnea and lower extremity edema.  Apparently ran out of his diuretic therapy 2 to 3 days ago.  Developing worsening dyspnea to the point where he became symptomatic with minimal efforts.  Severe lower extremity edema.  On his initial physical examination blood pressure 123/106, heart rate 76, respiratory 22, oxygen saturation 98%, his lungs had decreased breath sounds bilaterally, heart S1-S2, present, rhythmic, soft abdomen, positive 2-3 + bilateral extremity edema. Patient has been placed on aggressive diuresis with intravenous furosemide.   He was recently seen at [facility] by the Palliative care team, he was at this time confirmed to want all interventions to sustain life.   Today's Discussion (06/17/2021):  *Please note that this is a verbal dictation therefore any spelling or grammatical errors are due to the "[employer]" system interpretation.  Chart reviewed. I met with Dale [name] at bedside. He shares that he is "going to get his life straight". He shares with me a multitude of life stressors one of which is that his son will not return his phone calls.  He shares that he is very close with his two children and does not understand what is going on. I utilized reflective listing while with him.  Dale [name] also shares the home to get into transitional housing so that he me be in an NA program for his cocaine abuse. He expresses that "this is my only shot" to improve his situation. He continue to call housing offices and is frustrated that he has not heard back from anyone as of  yet.  Dale [name] is looking forward to speaking to the psychiatrist tomorrow. He expresses that he needs this as he is plagued by many traumatic memories.  We reviewed briefly Dale [name] reason for being here and the need to, moving forward lead a straight life. He states that he knows and we both acknowledge that this is easier said than done. We discussed the severity of his heart failure and the reality that this is not going to improve but he can and should remain responsible for his bodies maintenance.   Questions and concerns addressed   Jessica was thankful for out visit.   Objective Assessment: Vital Signs Vitals:   06/17/21 0815 06/17/21 1050  BP: 103/71 115/75  Pulse: 81 78  Resp: 18 18  Temp: (!) 97.5 F (36.4 C) 97.9 F (36.6 C)  SpO2: 100% 100%    Intake/Output Summary (Last 24 hours) at 06/17/2021 1212 Last data filed at 06/17/2021 1207 Gross per 24 hour  Intake 1800 ml  Output 6000 ml  Net -4200 ml   Last Weight  Most recent update: 06/17/2021  3:54 AM    Weight  123.8 kg (272 lb 14.4 oz)            Gen:  Middle aged [place] M in NAD HEENT: moist mucous membranes CV: Regular rate and rhythm  PULM: On 2LPM [place] ABD: soft/nontender  EXT: (+) BLE edema Neuro: Alert and oriented x3   SUMMARY OF RECOMMENDATIONS   Now full code again per primary hospitalist   Appreciate Chaplain helping with Advance Directives   Patient  has requested Psychiatry   Will asked MSW to review patients VA benefits as they may be able to provide resources for him   Ongoing PMT support  Time Spent: 35 Greater than 50% of the time was spent in counseling and coordination of care ______________________________________________________________________________________ English Team Team Cell Phone: [phone] Please utilize secure chat with additional questions, if there is no response within 30 minutes please call the above phone number  Palliative  Medicine Team providers are available by phone from 7am to 7pm daily and can be reached through the team cell phone.  Should this patient require assistance outside of these hours, please call the patient's attending physician.

## 2021-06-17 NOTE — Progress Notes (Signed)
I was discussing the DNR band with the patient. He stated that he would want CPR done if something happened. Communicated with the MD. MD changed him to full code.

## 2021-06-17 NOTE — Progress Notes (Signed)
   06/17/21 1300  Clinical Encounter Type  Visited With Patient  Visit Type Initial;Spiritual support  Referral From Nurse  Consult/Referral To Chaplain  Spiritual Encounters  Spiritual Needs Prayer;Emotional  Stress Factors  Patient Stress Factors Exhausted;Family relationships;Financial concerns;Health changes;Lack of caregivers;Loss of control;Major life changes  Family Stress Factors Major life changes;Loss of control  Chaplain was called to bedside of patient who was alert and sitting up and eager to chat.  Dale [name] spoke at length about his stressors of family, finances, home and emotional challenges for his medical condition at this time.  He spoke at length reminiscing of his childhood.  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  He stated that he missed his family "and all the old ways of life" and wish he had such support now. 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  He spoke of his regret that he would've been able to possibly provide for his family if he pursued his athletic career.  He spoke of his concern for his safety in home living environment and asked about having a referral due to his medical condition - to a Skilled Nursing Facility.  SNF.  Asked for advice of his future. Chaplain was able to provide spiritual and emotional support for his existential distress at this time.   Ast patient's request Chaplain provided prayer for Dale [name].  He requested additional visits by [facility] staff. Ended visit with a departing blessing.     Respectfully submitted,  Rev. Clydie Braun Huddelson-Cian Costanzo, M. Div., BCC, BCPC, CFC

## 2021-06-17 NOTE — Plan of Care (Signed)
  Problem: Skin Integrity: Goal: Risk for impaired skin integrity will decrease Outcome: Completed/Met   Problem: Safety: Goal: Ability to remain free from injury will improve Outcome: Completed/Met   Problem: Pain Managment: Goal: General experience of comfort will improve Outcome: Completed/Met

## 2021-06-17 NOTE — Progress Notes (Addendum)
Progress Note  Patient Name: Dale [name] Date of Encounter: 06/17/2021  [facility] Cardiologist: Kristeen Miss, MD   Subjective   Pt denies CP or dyspnea  Inpatient Medications    Scheduled Meds:  aspirin EC  81 mg Oral Daily   atorvastatin  40 mg Oral Daily   carvedilol  3.125 mg Oral BID WC   dapagliflozin propanediol  10 mg Oral Daily   enoxaparin (LOVENOX) injection  60 mg Subcutaneous Q24H   furosemide  80 mg Intravenous TID   gabapentin  300 mg Oral QHS   losartan  25 mg Oral Daily   potassium chloride SA  20 mEq Oral Daily   sodium chloride flush  3 mL Intravenous Q12H   spironolactone  25 mg Oral Daily   Continuous Infusions:  sodium chloride     PRN Meds: sodium chloride, acetaminophen, diclofenac sodium, melatonin, ondansetron (ZOFRAN) IV, sodium chloride flush   Vital Signs    Vitals:   06/16/21 1958 06/16/21 2127 06/17/21 0350 06/17/21 0815  BP: 109/74  108/82 103/71  Pulse: 85  83 81  Resp: 18  20 18   Temp: 97.9 F (36.6 C)  98.6 F (37 C) (!) 97.5 F (36.4 C)  TempSrc: Oral Oral Oral Oral  SpO2: 99%  100% 100%  Weight:   123.8 kg   Height:        Intake/Output Summary (Last 24 hours) at 06/17/2021 06/19/2021 Last data filed at 06/17/2021 0814 Gross per 24 hour  Intake 2509 ml  Output 5800 ml  Net -3291 ml    Last 3 Weights 06/17/2021 06/16/2021 06/15/2021  Weight (lbs) 272 lb 14.4 oz 278 lb 14.4 oz 279 lb 3.2 oz  Weight (kg) 123.787 kg 126.508 kg 126.644 kg      Telemetry    Sinus with PVCs and 12 beats NSVT- Personally Reviewed   Physical Exam   GEN: WD WN NAD Neck: supple Cardiac: RRR Respiratory: CTA GI: Soft, NT/ND MS: 2+ edema Neuro:  Grossly intact Psych: Normal affect   Labs    High Sensitivity Troponin:   Recent Labs  Lab 06/01/21 2000 06/01/21 2153 06/13/21 0057 06/13/21 0250  TROPONINIHS 1,279* 1,127* 28* 26*       Chemistry Recent Labs  Lab 06/15/21 0825 06/16/21 0424 06/17/21 0621  NA 139  137 136  K 3.8 3.7 3.8  CL 101 101 102  CO2 28 27 25   GLUCOSE 135* 87 104*  BUN 45* 41* 35*  CREATININE 1.68* 1.52* 1.34*  CALCIUM 8.7* 8.6* 8.6*  GFRNONAA 47* 53* >60  ANIONGAP 10 9 9       Hematology Recent Labs  Lab 06/13/21 0057  WBC 6.2  RBC 4.56  HGB 12.8*  HCT 38.9*  MCV 85.3  MCH 28.1  MCHC 32.9  RDW 16.7*  PLT 274     BNP Recent Labs  Lab 06/13/21 0057  BNP 2,895.8*       Patient Profile     60 y.o. male with past medical history of coronary artery disease, chronic stage III kidney disease, mixed nonischemic/ischemic cardiomyopathy, hypertension, hyperlipidemia, substance abuse, noncompliance, ventricular tachycardia admitted with acute on chronic combined systolic/diastolic congestive heart failure.  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  Last echocardiogram showed ejection fraction less than 10%, severe left ventricular enlargement, grade 2 diastolic dysfunction, severe right ventricular enlargement with severe RV dysfunction, severe biatrial enlargement, mild mitral regurgitation, mild to moderate  tricuspid regurgitation.  Assessment & Plan    1 acute on chronic combined systolic/diastolic congestive heart failure-weight-123.8 kg I/O since admission -10,801.  Volume status slowly improving.  Continue Lasix at present dose.  Follow renal function.  2 mixed ischemic/nonischemic cardiomyopathy-we will increase losartan to 50 mg daily.  We will consider transitioning to [facility] if his blood pressure tolerates this dose of losartan.  Continue carvedilol and advance later as CHF improves and as tolerated by blood pressure.  Continue Farxiga.  As outlined previously he is not a candidate for ICD given history of noncompliance and substance abuse.    3 chronic stage IIIa kidney disease-renal function improved today.  Continue to follow.  4 noncompliance-we  discussed importance of complying with medications.  He will likely need follow-up in the advanced heart failure clinic after discharge.  5 history of substance abuse-needs to discontinue.  6 coronary artery disease-continue aspirin and statin.  For questions or updates, please contact [facility] Please consult [email or web address] for contact info under        Signed, Olga Millers, MD  06/17/2021, 8:22 AM

## 2021-06-17 NOTE — Progress Notes (Addendum)
PROGRESS NOTE    Dale [name]  YKD:[id number] DOB: 06/22/1961 DOA: 06/13/2021 PCP: Vesta Mixer, MD    Brief Narrative:  Mr. Kollmann was admitted to the hospital working diagnosis of acute decompensated systolic heart failure.   60 year old male past medical history for hypertension, dyslipidemia, systolic heart failure, chronic kidney disease, cocaine abuse, history of ventricular tachycardia who presented with dyspnea and lower extremity edema.  Apparently ran out of his diuretic therapy 2 to 3 days ago.  Developing worsening dyspnea to the point where he became symptomatic with minimal efforts.  Severe lower extremity edema.  On his initial physical examination blood pressure 123/106, heart rate 76, respiratory 22, oxygen saturation 98%, his lungs had decreased breath sounds bilaterally, heart S1-S2, present, rhythmic, soft abdomen, positive 2-3 + bilateral extremity edema.   NA 137, K 3,7, CL 105, bicarb 21, glucose 108, BUN 49, cr 1,88, BNP 2,895, Troponin I 28-26. WBC 6,2, hgb 12,8, hct 38.9, Plt 274 COVID 19 negative   Chest radiograph with cardiomegaly, bilateral hilar vascular congestion and bilateral small pleural effusions.    EKG 71 bpm, left axis deviation, 1st degree AV block, manually corrected QTC 489, sinus rhythm, PVC, no significant ST segment or T wave changes.    Patient has been placed on aggressive diuresis with intravenous furosemide.   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 LV EF less than 10% per echocardiogram on 05/2021.   Urine output over last 24 hrs is 5425 ml, continue to improve volume overload but not yet back to baseline. Blood pressure 103 to 115 systolic.  Diuresis with furosemide 80 mg IV q8 hrs.  Medical therapy with Losartan, carvedilol, spironolactone and dapagliflozin     2. AKI on CKD stage 3a. hypokalemia  Serum cr at 1,34 with K at 3,8 and serum bicarbonate at 25. Mg 2,0.  Continue diuresis with furosemide, follow up renal function in am.  Continue K correction with Kcl    3. HTN/ dyslipidemia . On losartan and carvedilol.     Statin therapy    4. Polysubstance abuse/ depression. No sings of withdrawal.  Patient not suicidal, but would like to have a psychiatric evaluation during this hospitalization    Consult psychiatry on 08/15   5. Obesity class 2. Calculated BMI is 38,5    Patient continue to be at high risk for worsening heart failure   Status is: Inpatient  Remains inpatient appropriate because:Inpatient level of care appropriate due to severity of illness  Dispo: The patient is from: Home              Anticipated d/c is to: Home              Patient currently is not medically stable to d/c.   Difficult to place patient No    DVT prophylaxis: Enoxaparin   Code Status:    full (today he changed his code status to full) Family Communication:   No family at the  bedside      Consultants:  Cardiology   Subjective:  Patient with improvement in dyspnea and edema but not yet back to  baseline, no nausea or vomiting, no chest pain.   Objective: Vitals:   06/16/21 2127 06/17/21 0350 06/17/21 0815 06/17/21 1050  BP:  108/82 103/71 115/75  Pulse:  83 81 78  Resp:  20 18 18   Temp:  98.6 F (37 C) (!) 97.5 F (36.4 C) 97.9 F (36.6 C)  TempSrc: Oral Oral Oral Oral  SpO2:  100% 100% 100%  Weight:  123.8 kg    Height:        Intake/Output Summary (Last 24 hours) at 06/17/2021  1131 Last data filed at 06/17/2021 1051 Gross per 24 hour  Intake 1800 ml  Output 5750 ml  Net -3950 ml   Filed Weights   06/15/21 0300 06/16/21 0333 06/17/21 0350  Weight: 126.6 kg 126.5 kg 123.8 kg    Examination:   General: Not in pain. Deconditioned  Neurology: Awake and alert, non focal  E ENT: no pallor, no icterus, oral mucosa moist Cardiovascular: . S1-S2 present, rhythmic, no gallops, rubs, or murmurs. +++ pitting bilateral lower extremity edema. Pulmonary: positive breath sounds bilaterally, with no wheezing, or rhonchi positive rales. Gastrointestinal. Abdomen protuberant Skin. No rashes Musculoskeletal: no joint deformities     Data Reviewed: I have personally reviewed following labs and imaging studies  CBC: Recent Labs  Lab 06/13/21 0057  WBC 6.2  HGB 12.8*  HCT 38.9*  MCV 85.3  PLT 274   Basic Metabolic Panel: Recent Labs  Lab 06/13/21 0057 06/14/21 0346 06/14/21 0646 06/15/21 0825 06/16/21 0424 06/17/21 0621  NA 137 138  --  139 137 136  K 3.7 3.8  --  3.8 3.7 3.8  CL 105 101  --  101 101 102  CO2 21* 24  --  28 27 25   GLUCOSE 108* 110*  --  135* 87 104*  BUN 49* 53*  --  45* 41* 35*  CREATININE 1.88* 2.07*  --  1.68* 1.52* 1.34*  CALCIUM 9.0 8.9  --  8.7* 8.6* 8.6*  MG  --   --  2.5*  --  1.9 2.0   GFR: Estimated Creatinine Clearance: 81.5 mL/min (A) (by C-G formula based on SCr of 1.34 mg/dL (H)). Liver Function Tests: No results for input(s): AST, ALT, ALKPHOS, BILITOT, PROT, ALBUMIN in the last 168 hours. No results for input(s): LIPASE, AMYLASE in the last 168 hours. No results for input(s): AMMONIA in the last 168 hours. Coagulation Profile: No results for input(s): INR, PROTIME in the last 168 hours. Cardiac Enzymes: No results for input(s): CKTOTAL, CKMB, CKMBINDEX, TROPONINI in the last 168 hours. BNP (last 3 results) No results for input(s): PROBNP in the last 8760 hours. HbA1C: No results for input(s): HGBA1C in the last  72 hours. CBG: No results for input(s): GLUCAP in the last 168 hours. Lipid Profile: No results for input(s): CHOL, HDL, LDLCALC, TRIG, CHOLHDL, LDLDIRECT in the last 72 hours. Thyroid Function Tests: No results for input(s): TSH, T4TOTAL, FREET4, T3FREE, THYROIDAB in the last 72 hours. Anemia Panel: No results for input(s): VITAMINB12, FOLATE, FERRITIN, TIBC, IRON, RETICCTPCT in the last 72 hours.    Radiology Studies: I have reviewed all of the imaging during this hospital visit personally     Scheduled Meds:  aspirin EC  81 mg Oral Daily   atorvastatin  40 mg Oral Daily   carvedilol  3.125 mg Oral  BID WC   dapagliflozin propanediol  10 mg Oral Daily   enoxaparin (LOVENOX) injection  60 mg Subcutaneous Q24H   furosemide  80 mg Intravenous TID   gabapentin  300 mg Oral QHS   losartan  50 mg Oral Daily   potassium chloride SA  20 mEq Oral Daily   sodium chloride flush  3 mL Intravenous Q12H   spironolactone  25 mg Oral Daily   Continuous Infusions:  sodium chloride       LOS: 4 days        Imane Burrough Annett Gula, MD

## 2021-06-18 ENCOUNTER — Ambulatory Visit: Payer: Medicaid Other | Admitting: Family

## 2021-06-18 DIAGNOSIS — F4323 Adjustment disorder with mixed anxiety and depressed mood: Secondary | ICD-10-CM

## 2021-06-18 DIAGNOSIS — F1410 Cocaine abuse, uncomplicated: Secondary | ICD-10-CM

## 2021-06-18 DIAGNOSIS — I5040 Unspecified combined systolic (congestive) and diastolic (congestive) heart failure: Secondary | ICD-10-CM | POA: Diagnosis not present

## 2021-06-18 LAB — BASIC METABOLIC PANEL
Anion gap: 8 (ref 5–15)
BUN: 32 mg/dL — ABNORMAL HIGH (ref 6–20)
CO2: 26 mmol/L (ref 22–32)
Calcium: 8.7 mg/dL — ABNORMAL LOW (ref 8.9–10.3)
Chloride: 101 mmol/L (ref 98–111)
Creatinine, Ser: 1.39 mg/dL — ABNORMAL HIGH (ref 0.61–1.24)
GFR, Estimated: 59 mL/min — ABNORMAL LOW (ref >60)
Glucose, Bld: 105 mg/dL — ABNORMAL HIGH (ref 70–99)
Potassium: 3.9 mmol/L (ref 3.5–5.1)
Sodium: 135 mmol/L (ref 135–145)

## 2021-06-18 MED ORDER — HYDROXYZINE HCL 25 MG PO TABS: 25.0000 mg | ORAL_TABLET | Freq: Two times a day (BID) | ORAL | Status: DC | PRN

## 2021-06-18 MED ORDER — FUROSEMIDE 10 MG/ML IJ SOLN
80.0000 mg | Freq: Two times a day (BID) | INTRAMUSCULAR | Status: DC
  Administered 2021-06-18 – 2021-06-19 (×3): 80 mg via INTRAVENOUS
  Filled 2021-06-18 (×3): qty 8

## 2021-06-18 MED ORDER — SACUBITRIL-VALSARTAN 24-26 MG PO TABS
1.0000 | ORAL_TABLET | Freq: Two times a day (BID) | ORAL | Status: DC
  Administered 2021-06-18 – 2021-06-21 (×6): 1 via ORAL
  Filled 2021-06-18 (×7): qty 1

## 2021-06-18 MED ORDER — HYDROXYZINE HCL 25 MG PO TABS: 50.0000 mg | ORAL_TABLET | Freq: Every evening | ORAL | Status: DC | PRN

## 2021-06-18 NOTE — Discharge Instructions (Signed)
For [facility] residents who are uninsured or have Medicare/Medicaid:  Please come to [facility] during walk in hours for appointment with psychiatrist for further medication management and for therapists for therapy.    Walk in hours are 8-11 AM Monday through Thursday for medication management. Therapy walk in hours are Monday-Wednesday 8 AM-1PM.  It is first come, first -serve; it is best to arrive by 7:00 AM.   On Friday from 1 pm to 4 pm for therapy intake only. Please arrive by 12:00 pm as it is  first come, first -serve.    When you arrive please go upstairs for your appointment. If you are unsure of where to go, inform the front desk that you are here for a walk in appointment and they will assist you with directions upstairs.  Address:  [address], [address] floor, [place], [place] [address] Ph: [phone]

## 2021-06-18 NOTE — Progress Notes (Signed)
Progress Note  Patient Name: Dale [name] Date of Encounter: 06/18/2021  [facility] Cardiologist: Kristeen Miss, MD   Subjective   Doing well with no CP or dyspnea  Inpatient Medications    Scheduled Meds:  aspirin EC  81 mg Oral Daily   atorvastatin  40 mg Oral Daily   carvedilol  3.125 mg Oral BID WC   dapagliflozin propanediol  10 mg Oral Daily   enoxaparin (LOVENOX) injection  60 mg Subcutaneous Q24H   furosemide  80 mg Intravenous TID   gabapentin  300 mg Oral QHS   losartan  50 mg Oral Daily   potassium chloride SA  20 mEq Oral Daily   sodium chloride flush  3 mL Intravenous Q12H   spironolactone  25 mg Oral Daily   Continuous Infusions:  sodium chloride     PRN Meds: sodium chloride, acetaminophen, diclofenac sodium, melatonin, ondansetron (ZOFRAN) IV, sodium chloride flush   Vital Signs    Vitals:   06/17/21 1050 06/17/21 1643 06/17/21 2042 06/18/21 0354  BP: 115/75 102/66 105/78 104/80  Pulse: 78 84 78 82  Resp: 18 17 18 19   Temp: 97.9 F (36.6 C)  98.7 F (37.1 C) 98.6 F (37 C)  TempSrc: Oral  Oral Oral  SpO2: 100% 97% 99% 99%  Weight:    121.5 kg  Height:        Intake/Output Summary (Last 24 hours) at 06/18/2021 0833 Last data filed at 06/18/2021 0600 Gross per 24 hour  Intake 1043 ml  Output 6950 ml  Net -5907 ml    Last 3 Weights 06/18/2021 06/17/2021 06/16/2021  Weight (lbs) 267 lb 12.8 oz 272 lb 14.4 oz 278 lb 14.4 oz  Weight (kg) 121.473 kg 123.787 kg 126.508 kg      Telemetry    Sinus with PVCs and 10 beats NSVT; transient Mobitz 1 at 3 AM.- Personally Reviewed   Physical Exam   GEN: NAD Neck: supple; JVP difficult to assess Cardiac: RRR, no rub Respiratory: CTA; no wheeze GI: Soft, NT/ND, no masses MS: 2+ edema Neuro:  No focal findings Psych: Normal affect   Labs    High Sensitivity Troponin:   Recent Labs  Lab 06/01/21 2000 06/01/21 2153 06/13/21 0057 06/13/21 0250  TROPONINIHS 1,279* 1,127* 28* 26*        Chemistry Recent Labs  Lab 06/16/21 0424 06/17/21 0621 06/18/21 0338  NA 137 136 135  K 3.7 3.8 3.9  CL 101 102 101  CO2 27 25 26   GLUCOSE 87 104* 105*  BUN 41* 35* 32*  CREATININE 1.52* 1.34* 1.39*  CALCIUM 8.6* 8.6* 8.7*  GFRNONAA 53* >60 59*  ANIONGAP 9 9 8       Hematology Recent Labs  Lab 06/13/21 0057  WBC 6.2  RBC 4.56  HGB 12.8*  HCT 38.9*  MCV 85.3  MCH 28.1  MCHC 32.9  RDW 16.7*  PLT 274     BNP Recent Labs  Lab 06/13/21 0057  BNP 2,895.8*       Patient Profile     60 y.o. male with past medical history of coronary artery disease, chronic stage III kidney disease, mixed nonischemic/ischemic cardiomyopathy, hypertension, hyperlipidemia, substance abuse, noncompliance, ventricular tachycardia admitted with acute on chronic combined systolic/diastolic congestive heart failure.  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  Last echocardiogram showed ejection fraction less than 10%, severe left ventricular enlargement, grade 2 diastolic dysfunction, severe  right ventricular enlargement with severe RV dysfunction, severe biatrial enlargement, mild mitral regurgitation, mild to moderate tricuspid regurgitation.  Assessment & Plan    1 acute on chronic combined systolic/diastolic congestive heart failure-weight-121.5 kg I/O since admission -[id number].  Still volume overloaded but improving.  We will change Lasix to 80 mg IV twice daily.  Continue to follow renal function.  2 mixed ischemic/nonischemic cardiomyopathy-blood pressure appears to be tolerating losartan 50 mg daily.  I will transition to [facility] 24/26 twice daily to see if he tolerates.  Continue carvedilol and advance later as CHF improves and as tolerated by blood pressure.  Continue Farxiga.  As outlined previously he is not a candidate for ICD given history of noncompliance and substance abuse.     3 chronic stage IIIa kidney disease-renal function unchanged.  We will continue to follow.  4 noncompliance-we discussed importance of complying with medications.  He will likely need follow-up in the advanced heart failure clinic after discharge.  5 history of substance abuse-needs to discontinue.  6 coronary artery disease-continue aspirin and statin.  For questions or updates, please contact [facility] Please consult [email or web address] for contact info under        Signed, Olga Millers, MD  06/18/2021, 8:33 AM

## 2021-06-18 NOTE — Progress Notes (Signed)
PROGRESS NOTE    [name]  XAJ:[id number] DOB: November 05, 1960 DOA: 06/13/2021 PCP: Vesta Mixer, MD    Brief Narrative:  [name] was admitted to the hospital working diagnosis of acute decompensated systolic heart failure.   60 year old male past medical history for hypertension, dyslipidemia, systolic heart failure, chronic kidney disease, cocaine abuse, history of ventricular tachycardia who presented with dyspnea and lower extremity edema.  Apparently ran out of his diuretic therapy 2 to 3 days ago.  Developing worsening dyspnea to the point where he became symptomatic with minimal efforts.  Severe lower extremity edema.  On his initial physical examination blood pressure 123/106, heart rate 76, respiratory 22, oxygen saturation 98%, his lungs had decreased breath sounds bilaterally, heart S1-S2, present, rhythmic, soft abdomen, positive 2-3 + bilateral extremity edema.   NA 137, K 3,7, CL 105, bicarb 21, glucose 108, BUN 49, cr 1,88, BNP 2,895, Troponin I 28-26. WBC 6,2, hgb 12,8, hct 38.9, Plt 274 COVID 19 negative   Chest radiograph with cardiomegaly, bilateral hilar vascular congestion and bilateral small pleural effusions.    EKG 71 bpm, left axis deviation, 1st degree AV block, manually corrected QTC 489, sinus rhythm, PVC, no significant ST segment or T wave changes.    Patient has been placed on aggressive diuresis with intravenous furosemide.   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 LV EF less than 10% per echocardiogram on 05/2021.    Urine output over last 24 hrs is 7325 ml, improving volume overload but not yet back to baseline. Blood pressure 105 to 106 mmHg.    Tolerating well aggressive diuresis with furosemide 80 mg IV q12 hrs.  Continue medical therapy with Losartan, carvedilol, spironolactone and dapagliflozin      2. AKI on CKD stage 3a. hypokalemia  Serum cr today is 1,39 with K at 3,9 and serum bicarbonate at 26. Continue close follow up on renal function and electrolytes.  Continue with K supplementation    3. HTN/ dyslipidemia . Continue blood pressure control with losartan and carvedilol.    Continue with  statin therapy    4. Polysubstance abuse/ depression. No sings of withdrawal.  Patient not suicidal, but would like to have a psychiatric evaluation during this hospitalization     Follow with psych recommendations.    5. Obesity class 2. Calculated BMI is 38,5    Patient continue to be at high risk for worsening heart failure   Status is: Inpatient  Remains inpatient appropriate because:Inpatient level of care appropriate due to severity of illness  Dispo: The patient is from: Home              Anticipated d/c is to: Home              Patient currently is not medically stable to d/c.   Difficult to place patient No    DVT prophylaxis: Enoxpariin   Code Status:    full  Family Communication:   No family at  the bedside     Consultants:  Cardiology     Subjective: Patient is feeling better, but not yet back to baseline, slowly improving lower extremity edema but continue significant swelling. Dyspnea and orthopnea have improved.   Objective: Vitals:   06/17/21 1643 06/17/21 2042 06/18/21 0354 06/18/21 1040  BP: 102/66 105/78 104/80 106/85  Pulse: 84 78 82 88  Resp: 17 18 19    Temp:  98.7 F (37.1 C) 98.6 F (37 C)   TempSrc:  Oral Oral   SpO2: 97% 99% 99%   Weight:   121.5 kg    Height:        Intake/Output Summary (Last 24 hours) at 06/18/2021 1322 Last data filed at 06/18/2021 1039 Gross per 24 hour  Intake 923 ml  Output 5400 ml  Net -4477 ml   Filed Weights   06/16/21 0333 06/17/21 0350 06/18/21 0354  Weight: 126.5 kg 123.8 kg 121.5 kg    Examination:   General: Not in pain, deconditioned  Neurology: Awake and alert, non focal  E ENT: no pallor, no icterus, oral mucosa moist Cardiovascular: No JVD. S1-S2 present, rhythmic, no gallops, rubs, or murmurs. +++ pitting bilateral llower extremity edema. Pulmonary: positive breath sounds bilaterally, with no wheezing, or rhonchi . Positive bilateral rales. Gastrointestinal. Abdomen soft and non tender Skin. No rashes Musculoskeletal: no joint deformities     Data Reviewed: I have personally reviewed following labs and imaging studies  CBC: Recent Labs  Lab 06/13/21 0057  WBC 6.2  HGB 12.8*  HCT 38.9*  MCV 85.3  PLT 274   Basic Metabolic Panel: Recent Labs  Lab 06/14/21 0346 06/14/21 0646 06/15/21 0825 06/16/21 0424 06/17/21 0621 06/18/21 0338  NA 138  --  139 137 136 135  K 3.8  --  3.8 3.7 3.8 3.9  CL 101  --  101 101 102 101  CO2 24  --  28 27 25 26   GLUCOSE 110*  --  135* 87 104* 105*  BUN 53*  --  45* 41* 35* 32*  CREATININE 2.07*  --  1.68* 1.52* 1.34* 1.39*  CALCIUM 8.9  --  8.7* 8.6* 8.6* 8.7*  MG  --  2.5*  --  1.9 2.0  --    GFR: Estimated Creatinine Clearance: 77.8 mL/min (A) (by C-G formula based on SCr of 1.39 mg/dL (H)). Liver Function Tests: No results for input(s): AST, ALT, ALKPHOS, BILITOT, PROT, ALBUMIN in the last 168 hours. No results for input(s): LIPASE, AMYLASE in the last 168 hours. No results for input(s): AMMONIA in the last 168 hours. Coagulation Profile: No results for input(s): INR, PROTIME in the last 168 hours. Cardiac Enzymes: No results for input(s): CKTOTAL, CKMB, CKMBINDEX, TROPONINI in the last 168 hours. BNP (last 3 results) No results  for input(s): PROBNP in the last 8760 hours. HbA1C: No results for input(s): HGBA1C in the last 72 hours. CBG: No results for input(s): GLUCAP in the last 168 hours. Lipid Profile: No results for input(s): CHOL, HDL, LDLCALC, TRIG, CHOLHDL, LDLDIRECT in the last 72 hours. Thyroid Function Tests: No results for input(s): TSH, T4TOTAL, FREET4, T3FREE, THYROIDAB in the last 72 hours. Anemia Panel: No results for input(s): VITAMINB12, FOLATE, FERRITIN, TIBC, IRON, RETICCTPCT in the last 72 hours.    Radiology Studies: I have reviewed all of the imaging during this hospital visit personally     Scheduled Meds:  aspirin EC  81 mg Oral Daily   atorvastatin  40 mg Oral Daily  carvedilol  3.125 mg Oral BID WC   dapagliflozin propanediol  10 mg Oral Daily   enoxaparin (LOVENOX) injection  60 mg Subcutaneous Q24H   furosemide  80 mg Intravenous BID   gabapentin  300 mg Oral QHS   potassium chloride SA  20 mEq Oral Daily   sacubitril-valsartan  1 tablet Oral BID   sodium chloride flush  3 mL Intravenous Q12H   spironolactone  25 mg Oral Daily   Continuous Infusions:  sodium chloride       LOS: 5 days        Amori Cooperman Annett Gula, MD

## 2021-06-18 NOTE — Progress Notes (Signed)
Physical Therapy Treatment Patient Details Name: Dale [name] MRN: [id number] DOB: 29-Jun-1961 Today's Date: 06/18/2021    History of Present Illness Pt is a 60 y.o. male admitted 06/13/21 with SOB and BLE swelling; of note, pt ran of out torsemide medication 2-3 days prior. Workup for CHF exacerbation. PMH includes HTN, HLD, NICM/ICM, CHF, CKD 3, polysubstance abuse.    PT Comments    Continuing work on functional mobility and activity tolerance;  Session focused on education re: benefits of getting up and walkign during hospital stay, doing some in-room walking, and some standing stretches to combat tightness and stiffness;   Pt spoke of his goals to quit his substance use problem, and hopes to get to substance abuse rehab; tells me, "I can't go back there; I'll start using again" when I inquired about his home situation; asked about a SNF stay, and that is not unreasonable re: getting skilled nursing care, and help transition to substance use rehab;   It sounds like if SNF/substance use rehab isn't an option to leave the hospital, he will need other options than his current living situation; he spoke of family in [place], thoguh didn't seem to think staying with them is an option  Follow Up Recommendations  SNF;Other (comment);No PT follow up (Pt is asking about going to SNF for skilled nursing support of HF, and to help get back on his feet to go to a substance abuse rehab center)     Equipment Recommendations  None recommended by PT    Recommendations for Other Services       Precautions / Restrictions Precautions Precautions: Fall Precaution Comments: Fall risk reduced with use of RW    Mobility  Bed Mobility Overal bed mobility: Modified Independent             General bed mobility comments: HOB elevated    Transfers Overall transfer level: Modified independent Equipment used: 4-wheeled walker                Ambulation/Gait Ambulation/Gait assistance:  Supervision Gait Distance (Feet): 40 Feet Assistive device: 4-wheeled walker Gait Pattern/deviations: Step-through pattern;Decreased stride length;Trunk flexed     General Gait Details: Hesitant to walk initially this session due to LE swelling; Agreed to walking in the room with gentle encouragement   Stairs             Wheelchair Mobility    Modified Rankin (Stroke Patients Only)       Balance     Sitting balance-Leahy Scale: Good       Standing balance-Leahy Scale: Fair                              Cognition Arousal/Alertness: Awake/alert Behavior During Therapy: WFL for tasks assessed/performed Overall Cognitive Status: Within Functional Limits for tasks assessed                                 General Comments: WFL for majority of simple tasks; tangential with speech at times requiring redirection; very focused on discussing how much he is ready to change his life, stop abusing cocaine and go to substance abuse rehab      Exercises Other Exercises Other Exercises: standing shoulder rolls Other Exercises: standing trunk rotation Other Exercises: standing trunk lateral flexion stretches    General Comments General comments (skin integrity, edema, etc.): Some dizziness with getting up:  BP 113/84, HR 90 at intial standing; standing BP after short in-room-amb 133/98, HR 90; walked on room air and O2 sats 98% after walk      Pertinent Vitals/Pain Pain Assessment: Faces Faces Pain Scale: Hurts a little bit Pain Location: bilateral lower legs/feet Pain Descriptors / Indicators: Sore;Tightness Pain Intervention(s): Monitored during session    Home Living                      Prior Function            PT Goals (current goals can now be found in the care plan section) Acute Rehab PT Goals Patient Stated Goal: Discharge to substance abuse rehab and stay off cocaine PT Goal Formulation: With patient Time For Goal  Achievement: 06/28/21 Potential to Achieve Goals: Good Progress towards PT goals: Progressing toward goals    Frequency    Min 3X/week      PT Plan Current plan remains appropriate;Discharge plan needs to be updated    Co-evaluation              AM-PAC PT "6 Clicks" Mobility   Outcome Measure  Help needed turning from your back to your side while in a flat bed without using bedrails?: None Help needed moving from lying on your back to sitting on the side of a flat bed without using bedrails?: None Help needed moving to and from a bed to a chair (including a wheelchair)?: None Help needed standing up from a chair using your arms (e.g., wheelchair or bedside chair)?: None Help needed to walk in hospital room?: A Little Help needed climbing 3-5 steps with a railing? : A Little 6 Click Score: 22    End of Session   Activity Tolerance: Patient tolerated treatment well Patient left: in bed;with call bell/phone within reach;Other (comment) (LEs elevated) Nurse Communication: Mobility status PT Visit Diagnosis: Other abnormalities of gait and mobility (R26.89)     Time: 7253-6644 PT Time Calculation (min) (ACUTE ONLY): 21 min  Charges:  $Gait Training: 8-22 mins                     Van Clines, PT  Acute Rehabilitation Services Pager [phone] Office [phone]    Levi Aland 06/18/2021, 2:40 PM

## 2021-06-18 NOTE — Progress Notes (Signed)
Heart Failure Stewardship Pharmacist Progress Note   PCP: Nahser, Deloris Ping, MD PCP-Cardiologist: Kristeen Miss, MD    HPI:  60 yo M with PMH of CAD, CKD III, mixed ischemic and nonischemic cardiomyopathy, HTN, HLD, medication noncompliance, alcohol and cocaine abuse, and VT. He presented to the ED on 8/10 with chest pain, shortness of breath, LE edema, and reported noncompliance with torsemide. He states he ran out of his torsemide 3-4 days prior to admission. He was recently hospitalized on 7/29-8/2 at [facility] for acute systolic heart failure. He left AMA. Last ECHO done on 05/12/21 and LVEF was <10% with G2DD and severe RV systolic dysfunction. All prior ECHOs reporting back to 09/2018 have shown LVEF ~20% and less. UDS on admission positive for THC. Patient states last cocaine use was approximately 2 weeks ago. CXR showed no active cardiopulmonary disease. Last R/LHC done in July 2020 and found to have severe single vessel CAD with chronic total occlusion of proximal RCA (RA 17, PA 25, wedge 24, CO 7.7, CI 3.4).  Current HF Medications: Furosemide 80 mg IV BID Carvedilol 3.125 mg BID Entresto 24/26 mg BID Spironolactone 25 mg daily Farxiga 10 mg daily  Prior to admission HF Medications: Torsemide 40 mg daily Carvedilol 6.25 mg BID Losartan 12.5 mg daily  Pertinent Lab Values: Serum creatinine 1.39, BUN 32, Potassium 3.9, Sodium 135, BNP 2895.8, Magnesium 2.0 A1c 6.1 (05/11/21) I/O: Net -16L since admission   Vital Signs: Weight: 267 lbs (admission weight: 275 lbs) Blood pressure: 100/80s  Heart rate: 80s   Medication Assistance / Insurance Benefits Check: Does the patient have prescription insurance?  Yes Type of insurance plan: [place] Medicaid  Outpatient Pharmacy:  Prior to admission outpatient pharmacy: [facility] Is the patient willing to use [facility] TOC pharmacy at discharge? Yes Is the patient willing to transition their outpatient pharmacy to utilize a [facility] outpatient pharmacy?    Pending    Assessment: 1. Acute on chronic systolic CHF (EF <75%), due to mixed ischemic and nonischemic cardiomyopathy. NYHA class III symptoms. - Agree with changing from furosemide 80 mg IV TID to BID - Continue carvedilol 3.125 mg BID  - Agree with transitioning losartan to Entresto 24/26 mg BID - Continue spironolactone 25 mg daily - Continue Farxiga 10 mg daily   Plan: 1) Medication changes recommended at this time: - Agree with changes as above  2) Patient assistance: - Patient has [place] Medicaid - all copays should be $0-4 per month and copays can be waived at [facility] pharmacies if he cannot afford his prescriptions - HF TOC appt scheduled for 8/22 - Appreciate assistance from HF CSW and RNCM to assist with SDOH needs  3)  Education  - To be completed prior to discharge  Sharen Hones, PharmD, BCPS Heart Failure Stewardship Pharmacist Phone [phone]

## 2021-06-18 NOTE — Consult Note (Addendum)
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 male patient admitted with acute decompensated systolic heart failure, and consulted to psychiatry for depression.  On assessment, Dale [name] says that he "needs a psych eval" because he is going through a lot and because he drank contaminated water while at [employer] in the [employer]	[place].  He says that he has numerous stressors including family, finances, relationship wise, and home safety concerns.  He admits that he has a significant history of cocaine use, that he went to rehab from March to February 07, 2021, and he relapsed on April 8 after going back into the same environment.  At that time his friends "left him for dead after using."  He has since continued to use cocaine until 1 month ago, and he states that he is ready to "get his life together."  He does not follow outpatient for psychiatric care, and does not see a therapist.  He endorses poor sleep, saying that he has not slept in a month due to worrying and difficulty breathing secondary to fluid on his lungs.  He endorses passive SI, saying that he has no intent nor plan.  He denies HI and AVH.  When asked how psychiatry can assist at this time, he says that he thought that he would  fill out paperwork for a psychiatric evaluation, that he would like group therapy sessions to decrease his idle time, and he would like to follow with a therapist regularly.  HPI: Dale [name] is a 60 year old male with a psychiatric history of cocaine use disorder, anxiety, depression, and a medical history of stage IIIb CKD, and an extensive cardiac history admitted for acute decompensated systolic heart failure.  Past Psychiatric History: See above  Risk to Self: None Risk to Others: No Prior Inpatient Therapy: None Prior Outpatient Therapy: Yes, [facility] in the late 80s or early 90s-saw once but did not follow-up.  Past Medical History:  Past Medical History:  Diagnosis Date   Alcohol abuse    CAD (coronary artery disease)    a. 05/2019 Cath: LM nl, LAD 15p, 31m, LCX large/nl, OM2 30, OM3 20, RCA 100 CTO w/ bridging L->R collats to RPDA.   Chronic combined systolic and diastolic CHF (congestive heart failure) (HCC)    a. 09/27/18 Echo: EF 20-25%, grade 2 DD; b. 03/2019 Echo: EF 20-25%; c. 05/2021 Echo: EF <10%, glob HK. GrII DD. Sev red RV fxn. Sev BAE. Mild MR. Mild-mod TR.   CKD (chronic kidney disease), stage II    Cocaine abuse (HCC)    Hyperlipidemia LDL goal <70    Hypertension    Ischemic cardiomyopathy    a. 09/27/18 Echo: EF 20-25%, grade 2 DD; b. 03/2019 Echo: EF 20-25%; c. 05/2019 Cath: RCA 100 CTA, otw nonobs dzs; d. 05/2021 Echo: EF <10%, glob HK. GrII DD. Sev red  RV fxn.   Morbid obesity (HCC)    Noncompliance    Normocytic anemia     Past Surgical History:  Procedure Laterality Date   RIGHT/LEFT HEART CATH AND CORONARY ANGIOGRAPHY N/A 05/10/2019   Procedure: RIGHT/LEFT HEART CATH AND CORONARY ANGIOGRAPHY;  Surgeon: Yvonne Kendall, MD;  Location: [facility] INVASIVE CV LAB;  Service: Cardiovascular;  Laterality: N/A;   Family History:  Family History  Problem Relation Age of Onset   Hypertension Maternal Grandmother    Family Psychiatric  History: Unknown Social History:  Social  History   Substance and Sexual Activity  Alcohol Use Yes   Comment: 2 quarts a week      Social History   Substance and Sexual Activity  Drug Use Yes   Frequency: 3.0 times per week   Types: Marijuana, Cocaine    Social History   Socioeconomic History   Marital status: Single    Spouse name: none   Number of children: Not on file   Years of education: Not on file   Highest education level: Not on file  Occupational History   Occupation: Disabled  Tobacco Use   Smoking status: Never   Smokeless tobacco: Never  Vaping Use   Vaping Use: Never used  Substance and Sexual Activity   Alcohol use: Yes    Comment: 2 quarts a week    Drug use: Yes    Frequency: 3.0 times per week    Types: Marijuana, Cocaine   Sexual activity: Not Currently  Other Topics Concern   Not on file  Social History Narrative   Lives w/ mother in [place] but recently visiting w/ dtr in [place].   Social Determinants of Health   Financial Resource Strain: High Risk   Difficulty of Paying Living Expenses: Hard  Food Insecurity: Food Insecurity Present   Worried About Programme researcher, broadcasting/film/video in the Last Year: Sometimes true   Ran Out of Food in the Last Year: Sometimes true  Transportation Needs: Unmet Transportation Needs   Lack of Transportation (Medical): Yes   Lack of Transportation (Non-Medical): Yes  Physical Activity: Not on file  Stress: Not on file  Social Connections: Not on file   Additional Social History:    Allergies:   Allergies  Allergen Reactions   Hydrocodone Itching    Labs:  Results for orders placed or performed during the hospital encounter of 06/13/21 (from the past 48 hour(s))  Basic metabolic panel     Status: Abnormal   Collection Time: 06/17/21  6:21 AM  Result Value Ref Range   Sodium 136 135 - 145 mmol/L   Potassium 3.8 3.5 - 5.1 mmol/L   Chloride 102 98 - 111 mmol/L   CO2 25 22 - 32 mmol/L   Glucose, Bld 104 (H) 70 - 99 mg/dL    Comment: Glucose reference  range applies only to samples taken after fasting for at least 8 hours.   BUN 35 (H) 6 - 20 mg/dL   Creatinine, Ser 1.58 (H) 0.61 - 1.24 mg/dL   Calcium 8.6 (L) 8.9 - 10.3 mg/dL   GFR, Estimated >30 >94 mL/min    Comment: (NOTE) Calculated using the CKD-EPI Creatinine Equation (2021)    Anion gap 9 5 - 15    Comment: Performed at [facility], [address] [place], [place] [address]  Magnesium     Status: None   Collection Time: 06/17/21  6:21 AM  Result Value Ref Range   Magnesium 2.0 1.7 -  2.4 mg/dL    Comment: Performed at [facility], [address] [place], [place] [address]  Basic metabolic panel     Status: Abnormal   Collection Time: 06/18/21  3:38 AM  Result Value Ref Range   Sodium 135 135 - 145 mmol/L   Potassium 3.9 3.5 - 5.1 mmol/L   Chloride 101 98 - 111 mmol/L   CO2 26 22 - 32 mmol/L   Glucose, Bld 105 (H) 70 - 99 mg/dL    Comment: Glucose reference range applies only to samples taken after fasting for at least 8 hours.   BUN 32 (H) 6 - 20 mg/dL   Creatinine, Ser 7.56 (H) 0.61 - 1.24 mg/dL   Calcium 8.7 (L) 8.9 - 10.3 mg/dL   GFR, Estimated 59 (L) >60 mL/min    Comment: (NOTE) Calculated using the CKD-EPI Creatinine Equation (2021)    Anion gap 8 5 - 15    Comment: Performed at [facility], [address], [place], [place] [address]    Current Facility-Administered Medications  Medication Dose Route Frequency Provider Last Rate Last Admin   0.9 %  sodium chloride infusion  250 mL Intravenous PRN Madelyn Flavors A, MD       acetaminophen (TYLENOL) tablet 650 mg  650 mg Oral Q4H PRN Madelyn Flavors A, MD       aspirin EC tablet 81 mg  81 mg Oral Daily Smith, Rondell A, MD   81 mg at 06/18/21 1037   atorvastatin (LIPITOR) tablet 40 mg  40 mg Oral Daily Smith, Rondell A, MD   40 mg at 06/18/21 1038   carvedilol (COREG) tablet 3.125 mg  3.125 mg Oral BID WC Arrien, York Ram, MD   3.125 mg at 06/18/21 1624   dapagliflozin propanediol  (FARXIGA) tablet 10 mg  10 mg Oral Daily Arrien, York Ram, MD   10 mg at 06/18/21 1037   diclofenac sodium (VOLTAREN) 1 % transdermal gel 2 g  2 g Topical QID PRN Madelyn Flavors A, MD       enoxaparin (LOVENOX) injection 60 mg  60 mg Subcutaneous Q24H Smith, Rondell A, MD       furosemide (LASIX) injection 80 mg  80 mg Intravenous BID Lewayne Bunting, MD   80 mg at 06/18/21 1622   gabapentin (NEURONTIN) capsule 300 mg  300 mg Oral QHS Smith, Rondell A, MD   300 mg at 06/17/21 2148   melatonin tablet 5 mg  5 mg Oral QHS PRN Dow Adolph N, DO   5 mg at 06/13/21 2116   ondansetron (ZOFRAN) injection 4 mg  4 mg Intravenous Q6H PRN Smith, Rondell A, MD       potassium chloride SA (KLOR-CON) CR tablet 20 mEq  20 mEq Oral Daily Smith, Rondell A, MD   20 mEq at 06/18/21 1038   sacubitril-valsartan (ENTRESTO) 24-26 mg per tablet  1 tablet Oral BID Lewayne Bunting, MD   1 tablet at 06/18/21 1038   sodium chloride flush (NS) 0.9 % injection 3 mL  3 mL Intravenous Q12H Smith, Rondell A, MD   3 mL at 06/18/21 1039   sodium chloride flush (NS) 0.9 % injection 3 mL  3 mL Intravenous PRN Madelyn Flavors A, MD       spironolactone (ALDACTONE) tablet 25 mg  25 mg Oral Daily Arrien, York Ram, MD   25 mg at 06/18/21 1038    Musculoskeletal: Strength & Muscle Tone: within normal limits  Gait & Station: normal Patient leans: N/A   Psychiatric Specialty Exam:  Presentation  General Appearance: Disheveled  Eye Contact:Good  Speech:Clear and Coherent  Speech Volume:Normal  Handedness: No data recorded  Mood and Affect  Mood:Depressed  Affect:Non-Congruent; Full Range; Appropriate   Thought Process  Thought Processes:Coherent; Goal Directed  Descriptions of Associations:Intact (Mostly intact, sometimes circumstantial.)  Orientation:Full (Time, Place and Person)  Thought Content:WDL  History of Schizophrenia/Schizoaffective disorder:No data recorded Duration of Psychotic  Symptoms:No data recorded Hallucinations:Hallucinations: None  Ideas of Reference:None  Suicidal Thoughts:Suicidal Thoughts: Yes, Passive SI Passive Intent and/or Plan: Without Intent; Without Plan  Homicidal Thoughts:Homicidal Thoughts: No   Sensorium  Memory:Immediate Good; Recent Good; Remote Good  Judgment:Good  Insight:Fair   Executive Functions  Concentration:Good  Attention Span:Good  Recall:Good  Fund of Knowledge:Good  Language:Good   Psychomotor Activity  Psychomotor Activity:Psychomotor Activity: Normal   Assets  Assets:Desire for Improvement; Manufacturing systems engineer; Financial Resources/Insurance; Resilience   Sleep  Sleep:Sleep: Poor Number of Hours of Sleep: 0   Physical Exam: Physical Exam Vitals reviewed.  HENT:     Head: Normocephalic and atraumatic.  Eyes:     Extraocular Movements: Extraocular movements intact.  Cardiovascular:     Rate and Rhythm: Normal rate.  Pulmonary:     Effort: Pulmonary effort is normal.  Musculoskeletal:        General: Normal range of motion.     Cervical back: Normal range of motion.     Right lower leg: Edema present.     Left lower leg: Edema present.  Neurological:     General: No focal deficit present.     Mental Status: He is alert and oriented to person, place, and time.  Psychiatric:        Attention and Perception: Attention normal. He does not perceive auditory or visual hallucinations.        Mood and Affect: Affect normal. Mood is depressed.        Speech: Speech normal.        Behavior: Behavior normal.        Thought Content: Thought content is not paranoid or delusional. Thought content includes suicidal ideation. Thought content does not include homicidal ideation. Thought content does not include suicidal plan.        Cognition and Memory: Cognition and memory normal.        Judgment: Judgment normal.     Comments: Verbose, but normal rate and rhythm of speech.   Review of Systems   Psychiatric/Behavioral:  Positive for depression, substance abuse and suicidal ideas. Negative for hallucinations and memory loss. The patient is nervous/anxious and has insomnia.        Denies current marijuana use; d/c cocaine use one month ago  All other systems reviewed and are negative. Blood pressure (!) 96/56, pulse 80, temperature 98.6 F (37 C), temperature source Oral, resp. rate 19, height 5\' 11"  (1.803 m), weight 121.5 kg, SpO2 99 %. Body mass index is 37.35 kg/m.  Treatment Plan Summary: Juandavid is a 60 year old man with a psychiatric history of cocaine use disorder, anxiety, and depression who was consulted to this service for depression.  On assessment, his symptoms are consistent with adjustment disorder secondary to losing friends, family, and housing.  At this time, he does not meet criteria for an inpatient psychiatric admission.  -Start hydroxyzine 25 mg twice daily as needed anxiety, and 50 mg nightly as needed for insomnia.  Medication approved by primary attending, 60, MD. -Would like  information for [facility] boarding house (patient gets [phone]$841 per month in SSI) for once medically cleared. - Patient would like information for psychiatric evaluation forms in order to receive compensation of some sort. SW to kindly assist with these 2 items. -Information for [facility] and other outpatient therapy resources will be provided in discharge materials. -Patient would like to continue seeing the chaplain while admitted.    Disposition: No evidence of imminent risk to self or others at present.   Patient does not meet criteria for psychiatric inpatient admission.   Patient is determined to be psychiatrically stable at this time. Psychiatry will sign off. Please do not hesitate to call back if questions arise. Thank you for this consult.    Lamar Sprinklesourtney Avik Leoni, MD 06/18/2021 5:35 PM

## 2021-06-19 DIAGNOSIS — I5023 Acute on chronic systolic (congestive) heart failure: Secondary | ICD-10-CM | POA: Diagnosis not present

## 2021-06-19 DIAGNOSIS — I5040 Unspecified combined systolic (congestive) and diastolic (congestive) heart failure: Secondary | ICD-10-CM | POA: Diagnosis not present

## 2021-06-19 LAB — BASIC METABOLIC PANEL
Anion gap: 12 (ref 5–15)
BUN: 29 mg/dL — ABNORMAL HIGH (ref 6–20)
CO2: 25 mmol/L (ref 22–32)
Calcium: 8.5 mg/dL — ABNORMAL LOW (ref 8.9–10.3)
Chloride: 100 mmol/L (ref 98–111)
Creatinine, Ser: 1.36 mg/dL — ABNORMAL HIGH (ref 0.61–1.24)
GFR, Estimated: >60 mL/min (ref >60)
Glucose, Bld: 85 mg/dL (ref 70–99)
Potassium: 4.3 mmol/L (ref 3.5–5.1)
Sodium: 137 mmol/L (ref 135–145)

## 2021-06-19 NOTE — Progress Notes (Signed)
PROGRESS NOTE    Dale [name]  FBP:[id number] DOB: 1961/06/10 DOA: 06/13/2021 PCP: Vesta Mixer, MD    Brief Narrative:  Dale [name] was admitted to the hospital working diagnosis of acute decompensated systolic heart failure.   60 year old male past medical history for hypertension, dyslipidemia, systolic heart failure, chronic kidney disease, cocaine abuse, history of ventricular tachycardia who presented with dyspnea and lower extremity edema.  Apparently ran out of his diuretic therapy 2 to 3 days ago.  Developing worsening dyspnea to the point where he became symptomatic with minimal efforts.  Severe lower extremity edema.  On his initial physical examination blood pressure 123/106, heart rate 76, respiratory 22, oxygen saturation 98%, his lungs had decreased breath sounds bilaterally, heart S1-S2, present, rhythmic, soft abdomen, positive 2-3 + bilateral extremity edema.   NA 137, K 3,7, CL 105, bicarb 21, glucose 108, BUN 49, cr 1,88, BNP 2,895, Troponin I 28-26. WBC 6,2, hgb 12,8, hct 38.9, Plt 274 COVID 19 negative   Chest radiograph with cardiomegaly, bilateral hilar vascular congestion and bilateral small pleural effusions.    EKG 71 bpm, left axis deviation, 1st degree AV block, manually corrected QTC 489, sinus rhythm, PVC, no significant ST segment or T wave changes.    Patient has been placed on aggressive diuresis with intravenous furosemide.   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 LV EF less than 10% per echocardiogram on 05/2021.    Urine output over last 24 hrs is 1275  ml, edema continue to improve. He has a negative volume balance of - 20.452 ml since admission.  Blood pressure systolic 95 to 118 mmHg.    Heart failure management with Losartan, carvedilol, spironolactone and dapagliflozin  Plan to transition to oral diuretic therapy in am, for now continue with furosemide 80 mg Iv q12 hrs.     2. AKI on CKD stage 3a. hypokalemia  Renal function tolerating well diuresis, with serum cr today at 1,36 with K at 4,3 and serum bicarbonate at 25. Continue close follow up on renal function and electrolytes, will check Mg in am.    3. HTN/ dyslipidemia . Blood pressure systolic as low as 95 mmHg. Plan to continue with losartan and carvedilol.    On statin therapy    4. Polysubstance abuse/ depression. No active sings of withdrawal.  Patient not suicidal, but would like to have a psychiatric evaluation during this hospitalization     Patient placed on hydroxyzine with good toleration    5. Obesity class 2. Calculated BMI is 38,5    Patient continue to be at high risk for worsening heart failure   Status is: Inpatient  Remains inpatient appropriate because:Inpatient level of care appropriate due to severity of illness  Dispo: The patient is from: Home              Anticipated d/c is to: Home  Patient currently is not medically stable to d/c.   Difficult to place patient No   DVT prophylaxis: Heparin   Code Status:    full  Family Communication:   No family at the bedside     Consultants:  Cardiology    Subjective: Patient with improvement in edema and dyspnea, no nausea or vomiting, no chest pain, improved mobility.   Objective: Vitals:   06/18/21 2049 06/19/21 0522 06/19/21 0917 06/19/21 1242  BP: 103/72 118/70 104/74 95/63   Pulse: 84 82  81  Resp: (!) 23 20  18   Temp:  98.5 F (36.9 C)  97.7 F (36.5 C)  TempSrc:  Oral  Oral  SpO2: 95%   100%  Weight:  120 kg    Height:        Intake/Output Summary (Last 24 hours) at 06/19/2021 1303 Last data filed at 06/19/2021 1238 Gross per 24 hour  Intake 913 ml  Output 3725 ml  Net -2812 ml   Filed Weights   06/17/21 0350 06/18/21 0354 06/19/21 0522  Weight: 123.8 kg 121.5 kg 120 kg    Examination:   General: Not in pain or dyspnea, deconditioned  Neurology: Awake and alert, non focal  E ENT: no pallor, no icterus, oral mucosa moist Cardiovascular: No JVD. S1-S2 present, rhythmic, no gallops, rubs, or murmurs.++ pitting bilateral lower extremity edema. Pulmonary: positive breath sounds bilaterally, with no wheezing, rhonchi or rales. Gastrointestinal. Abdomen soft and non tender Skin. No rashes Musculoskeletal: no joint deformities     Data Reviewed: I have personally reviewed following labs and imaging studies  CBC: Recent Labs  Lab 06/13/21 0057  WBC 6.2  HGB 12.8*  HCT 38.9*  MCV 85.3  PLT 274   Basic Metabolic Panel: Recent Labs  Lab 06/14/21 0646 06/15/21 0825 06/16/21 0424 06/17/21 0621 06/18/21 0338 06/19/21 0407  NA  --  139 137 136 135 137  K  --  3.8 3.7 3.8 3.9 4.3  CL  --  101 101 102 101 100  CO2  --  28 27 25 26 25   GLUCOSE  --  135* 87 104* 105* 85  BUN  --  45* 41* 35* 32* 29*  CREATININE  --  1.68* 1.52* 1.34* 1.39* 1.36*  CALCIUM  --  8.7* 8.6* 8.6* 8.7* 8.5*  MG 2.5*  --  1.9 2.0  --   --    GFR: Estimated Creatinine Clearance: 79 mL/min (A) (by C-G formula based on SCr of 1.36 mg/dL (H)). Liver Function Tests: No results for input(s): AST, ALT, ALKPHOS, BILITOT, PROT, ALBUMIN in the last 168 hours. No results for input(s): LIPASE, AMYLASE in the last 168 hours. No results for input(s): AMMONIA in the last 168 hours. Coagulation Profile: No results for input(s): INR, PROTIME in the last 168  hours. Cardiac Enzymes: No results for input(s): CKTOTAL, CKMB, CKMBINDEX, TROPONINI in the last 168 hours. BNP (last 3 results) No results for input(s): PROBNP in the last 8760 hours. HbA1C: No results for input(s): HGBA1C in the last 72 hours. CBG: No results for input(s): GLUCAP in the last 168 hours. Lipid Profile: No results for input(s): CHOL, HDL, LDLCALC, TRIG, CHOLHDL, LDLDIRECT in the last 72 hours. Thyroid Function Tests: No results for input(s): TSH, T4TOTAL, FREET4, T3FREE, THYROIDAB in the last 72 hours. Anemia Panel: No results for input(s): VITAMINB12, FOLATE, FERRITIN, TIBC, IRON, RETICCTPCT in the last 72 hours.    Radiology Studies: I have reviewed all of the imaging during this  hospital visit personally     Scheduled Meds:  aspirin EC  81 mg Oral Daily   atorvastatin  40 mg Oral Daily   carvedilol  3.125 mg Oral BID WC   dapagliflozin propanediol  10 mg Oral Daily   enoxaparin (LOVENOX) injection  60 mg Subcutaneous Q24H   furosemide  80 mg Intravenous BID   gabapentin  300 mg Oral QHS   potassium chloride SA  20 mEq Oral Daily   sacubitril-valsartan  1 tablet Oral BID   sodium chloride flush  3 mL Intravenous Q12H   spironolactone  25 mg Oral Daily   Continuous Infusions:  sodium chloride       LOS: 6 days        Deryl Giroux Annett Gula, MD

## 2021-06-19 NOTE — Progress Notes (Signed)
Progress Note  Patient Name: Dale [name] Date of Encounter: 06/19/2021  [facility] Cardiologist: Kristeen Miss, MD   Subjective   No CP or dyspnea this AM  Inpatient Medications    Scheduled Meds:  aspirin EC  81 mg Oral Daily   atorvastatin  40 mg Oral Daily   carvedilol  3.125 mg Oral BID WC   dapagliflozin propanediol  10 mg Oral Daily   enoxaparin (LOVENOX) injection  60 mg Subcutaneous Q24H   furosemide  80 mg Intravenous BID   gabapentin  300 mg Oral QHS   potassium chloride SA  20 mEq Oral Daily   sacubitril-valsartan  1 tablet Oral BID   sodium chloride flush  3 mL Intravenous Q12H   spironolactone  25 mg Oral Daily   Continuous Infusions:  sodium chloride     PRN Meds: sodium chloride, acetaminophen, diclofenac sodium, hydrOXYzine, hydrOXYzine, ondansetron (ZOFRAN) IV, sodium chloride flush   Vital Signs    Vitals:   06/18/21 1040 06/18/21 1624 06/18/21 2049 06/19/21 0522  BP: 106/85 (!) 96/56 103/72 118/70  Pulse: 88 80 84 82  Resp:   (!) 23 20  Temp:    98.5 F (36.9 C)  TempSrc:    Oral  SpO2:   95%   Weight:    120 kg  Height:        Intake/Output Summary (Last 24 hours) at 06/19/2021 0755 Last data filed at 06/19/2021 0559 Gross per 24 hour  Intake 246 ml  Output 1275 ml  Net -1029 ml    Last 3 Weights 06/19/2021 06/18/2021 06/17/2021  Weight (lbs) 264 lb 8.8 oz 267 lb 12.8 oz 272 lb 14.4 oz  Weight (kg) 120 kg 121.473 kg 123.787 kg      Telemetry    Sinus with PVCs and 5beats NSVT- Personally Reviewed   Physical Exam   GEN: NAD, WD Neck: supple Cardiac: RRR Respiratory: CTA GI: Soft, NT/ND MS: 1-2+ edema Neuro:  Grossly intact Psych: Normal affect   Labs    High Sensitivity Troponin:   Recent Labs  Lab 06/01/21 2000 06/01/21 2153 06/13/21 0057 06/13/21 0250  TROPONINIHS 1,279* 1,127* 28* 26*       Chemistry Recent Labs  Lab 06/17/21 0621 06/18/21 0338 06/19/21 0407  NA 136 135 137  K 3.8 3.9 4.3   CL 102 101 100  CO2 25 26 25   GLUCOSE 104* 105* 85  BUN 35* 32* 29*  CREATININE 1.34* 1.39* 1.36*  CALCIUM 8.6* 8.7* 8.5*  GFRNONAA >60 59* >60  ANIONGAP 9 8 12       Hematology Recent Labs  Lab 06/13/21 0057  WBC 6.2  RBC 4.56  HGB 12.8*  HCT 38.9*  MCV 85.3  MCH 28.1  MCHC 32.9  RDW 16.7*  PLT 274     BNP Recent Labs  Lab 06/13/21 0057  BNP 2,895.8*       Patient Profile     60 y.o. male with past medical history of coronary artery disease, chronic stage III kidney disease, mixed nonischemic/ischemic cardiomyopathy, hypertension, hyperlipidemia, substance abuse, noncompliance, ventricular tachycardia admitted with acute on chronic combined systolic/diastolic congestive heart failure.  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  Last echocardiogram showed ejection fraction less than 10%, severe left ventricular enlargement, grade 2 diastolic dysfunction, severe right ventricular enlargement with severe RV dysfunction, severe biatrial enlargement, mild mitral regurgitation, mild to moderate tricuspid regurgitation.  Assessment &  Plan    1 acute on chronic combined systolic/diastolic congestive heart failure-weight-120 kg I/O since admission -17872.  Volume status slowly improving.  We will continue Lasix 80 mg IV twice daily.  Follow renal function.  Hopefully can transition to oral diuretic in the next 24 to 48 hours.  2 mixed ischemic/nonischemic cardiomyopathy-blood pressure appears to be tolerating Entresto 24/26 twice daily; will continue.  Continue carvedilol and advance later as CHF improves and as tolerated by blood pressure.  Continue Farxiga.  As outlined previously he is not a candidate for ICD given history of noncompliance and substance abuse.    3 chronic stage IIIa kidney disease-renal function unchanged today.  We will continue to follow.  4  noncompliance-we discussed importance of complying with medications.  He will likely need follow-up in the advanced heart failure clinic after discharge.  5 history of substance abuse-needs to discontinue.  6 coronary artery disease-continue aspirin and statin.  For questions or updates, please contact [facility] Please consult [email or web address] for contact info under        Signed, Olga Millers, MD  06/19/2021, 7:55 AM

## 2021-06-19 NOTE — Progress Notes (Addendum)
Heart Failure Stewardship Pharmacist Progress Note   PCP: Nahser, Deloris Ping, MD PCP-Cardiologist: Kristeen Miss, MD    HPI:  60 yo M with PMH of CAD, CKD III, mixed ischemic and nonischemic cardiomyopathy, HTN, HLD, medication noncompliance, alcohol and cocaine abuse, and VT. He presented to the ED on 8/10 with chest pain, shortness of breath, LE edema, and reported noncompliance with torsemide. He states he ran out of his torsemide 3-4 days prior to admission. He was recently hospitalized on 7/29-8/2 at [facility] for acute systolic heart failure. He left AMA. Last ECHO done on 05/12/21 and LVEF was <10% with G2DD and severe RV systolic dysfunction. All prior ECHOs reporting back to 09/2018 have shown LVEF ~20% and less. UDS on admission positive for THC. Patient states last cocaine use was approximately 2 weeks ago. CXR showed no active cardiopulmonary disease. Last R/LHC done in July 2020 and found to have severe single vessel CAD with chronic total occlusion of proximal RCA (RA 17, PA 25, wedge 24, CO 7.7, CI 3.4).  Current HF Medications: Furosemide 80 mg IV BID Carvedilol 3.125 mg BID Entresto 24/26 mg BID Spironolactone 25 mg daily Farxiga 10 mg daily  Prior to admission HF Medications: Torsemide 40 mg daily Carvedilol 6.25 mg BID Losartan 12.5 mg daily  Pertinent Lab Values: Serum creatinine 1.36, BUN 29, Potassium 4.3, Sodium 137, BNP 2895.8, Magnesium 2.0 A1c 6.1 (05/11/21) I/O: Net -17.6L since admission   Vital Signs: Weight: 264 lbs (admission weight: 275 lbs) Blood pressure: 110/80s  Heart rate: 80s   Medication Assistance / Insurance Benefits Check: Does the patient have prescription insurance?  Yes Type of insurance plan: [place] Medicaid  Outpatient Pharmacy:  Prior to [facility]: [facility] Is the patient willing to use [facility] TOC pharmacy at discharge? Yes Is the patient willing to transition their outpatient pharmacy to utilize a [facility] outpatient pharmacy?    Pending    Assessment: 1. Acute on chronic systolic CHF (EF <51%), due to mixed ischemic and nonischemic cardiomyopathy. NYHA class III symptoms. - Continue furosemide 80 mg IV BID - Continue carvedilol 3.125 mg BID  - Continue Entresto 24/26 mg BID - Continue spironolactone 25 mg daily - Continue Farxiga 10 mg daily   Plan: 1) Medication changes recommended at this time: - Continue current regimen  2) Patient assistance: - Patient has [place] Medicaid - all copays should be $0-4 per month and copays can be waived at [facility] pharmacies if he cannot afford his prescriptions - HF TOC appt scheduled for 8/22 - Appreciate assistance from HF CSW and RNCM. Further follow up of SDOH needs will be provided by outpatient CSW.  3)  Education  - To be completed prior to discharge  Sharen Hones, PharmD, BCPS Heart Failure Stewardship Pharmacist Phone [phone]

## 2021-06-20 DIAGNOSIS — I5040 Unspecified combined systolic (congestive) and diastolic (congestive) heart failure: Secondary | ICD-10-CM | POA: Diagnosis not present

## 2021-06-20 LAB — BASIC METABOLIC PANEL
Anion gap: 8 (ref 5–15)
BUN: 31 mg/dL — ABNORMAL HIGH (ref 6–20)
CO2: 30 mmol/L (ref 22–32)
Calcium: 9 mg/dL (ref 8.9–10.3)
Chloride: 98 mmol/L (ref 98–111)
Creatinine, Ser: 1.68 mg/dL — ABNORMAL HIGH (ref 0.61–1.24)
GFR, Estimated: 47 mL/min — ABNORMAL LOW (ref >60)
Glucose, Bld: 97 mg/dL (ref 70–99)
Potassium: 4.2 mmol/L (ref 3.5–5.1)
Sodium: 136 mmol/L (ref 135–145)

## 2021-06-20 LAB — MAGNESIUM: Magnesium: 2.3 mg/dL (ref 1.7–2.4)

## 2021-06-20 MED ORDER — TORSEMIDE 20 MG PO TABS
40.0000 mg | ORAL_TABLET | Freq: Two times a day (BID) | ORAL | Status: DC
  Administered 2021-06-20 – 2021-06-21 (×3): 40 mg via ORAL
  Filled 2021-06-20 (×3): qty 2

## 2021-06-20 NOTE — Progress Notes (Addendum)
Physical Therapy Treatment & Discharge Patient Details Name: Dale [name] MRN: [id number] DOB: [birth year] Today's Date: 06/20/2021    History of Present Illness Pt is a 60 y.o. male admitted 06/13/21 with SOB and BLE swelling; of note, pt ran of out torsemide medication 2-3 days prior. Workup for CHF exacerbation. PMH includes HTN, HLD, NICM/ICM, CHF, CKD 3, polysubstance abuse.   PT Comments    Pt progressing well with mobility. Pt mod indep with transfers and ambulation using rollator. Noted improvements in stability and DOE with activity. SpO2 97% on RA. Discussed d/c planning (SW notified pt still with questions regarding substance abuse rehab options). Pt has met short-term acute PT goals; education completed and questions answered. Will d/c acute PT.    Follow Up Recommendations  No PT follow up;Other (substance abuse rehab)     Equipment Recommendations  None recommended by PT    Recommendations for Other Services       Precautions / Restrictions Restrictions Weight Bearing Restrictions: No    Mobility  Bed Mobility Overal bed mobility: Independent                  Transfers Overall transfer level: Independent Equipment used: 4-wheeled walker;None                Ambulation/Gait Ambulation/Gait assistance: Modified independent (Device/Increase time)   Assistive device: 4-wheeled walker Gait Pattern/deviations: Step-through pattern;Decreased stride length;Trunk flexed Gait velocity: Decreased Gait velocity interpretation: 1.31 - 2.62 ft/sec, indicative of limited community ambulator General Gait Details: Slow, steady gait with rollator, mod indep; improved gait speed and upright posture with distance; further mobility limited by c/o bilateral knee soreness and fatigue; DOE 2-3/4 by end of walk, improved from prior ession   Chief Strategy Officer    Modified Rankin (Stroke Patients Only)       Balance Overall balance  assessment: Modified Independent Sitting-balance support: No upper extremity supported;Feet supported Sitting balance-Leahy Scale: Good       Standing balance-Leahy Scale: Fair Standing balance comment: can static stand and take steps without UE support                            Cognition Arousal/Alertness: Awake/alert Behavior During Therapy: WFL for tasks assessed/performed Overall Cognitive Status: Within Functional Limits for tasks assessed                                 General Comments: WFL for majority of simple tasks; some tangential speech at times requiring redirection; decreased insight of d/c plans and his need to contact substance abuse rehab options for admission (requested SW come back to speak with pt)      Exercises      General Comments General comments (skin integrity, edema, etc.): SpO2 97% on RA; improvements in DOE noted; ted hose donned; denies dizziness. Reviewed d/c recommendations      Pertinent Vitals/Pain Pain Assessment: Faces Faces Pain Scale: Hurts a little bit Pain Location: Bilateral knees, feet Pain Descriptors / Indicators: Sore;Tightness Pain Intervention(s): Monitored during session    Home Living                      Prior Function            PT Goals (current goals can now be found in  the care plan section) Progress towards PT goals: Goals met/education completed, patient discharged from PT    Frequency    Min 3X/week      PT Plan Current plan remains appropriate    Co-evaluation              AM-PAC PT "6 Clicks" Mobility   Outcome Measure  Help needed turning from your back to your side while in a flat bed without using bedrails?: None Help needed moving from lying on your back to sitting on the side of a flat bed without using bedrails?: None Help needed moving to and from a bed to a chair (including a wheelchair)?: None Help needed standing up from a chair using your arms  (e.g., wheelchair or bedside chair)?: None Help needed to walk in hospital room?: None Help needed climbing 3-5 steps with a railing? : None 6 Click Score: 24    End of Session   Activity Tolerance: Patient tolerated treatment well Patient left: in bed;with call bell/phone within reach Nurse Communication: Mobility status PT Visit Diagnosis: Other abnormalities of gait and mobility (R26.89)     Time: 0626-9485 PT Time Calculation (min) (ACUTE ONLY): 20 min  Charges:  $Gait Training: 8-22 mins           Mabeline Caras, PT, DPT Acute Rehabilitation Services  Pager [phone] Office [place] 06/20/2021, 1:58 PM

## 2021-06-20 NOTE — Progress Notes (Signed)
Pt kept removing tele multiple times whenever he wants to whole day. Continuous Education needed. Notified MD

## 2021-06-20 NOTE — Progress Notes (Signed)
PROGRESS NOTE    Dale [name]  NWG:[id number] DOB: [birth year] DOA: 06/13/2021 PCP: Vesta Mixer, MD    Chief Complaint  Patient presents with   Shortness of Breath    Brief Narrative:  Dale [name] is a 60 y.o. male with medical history hypertension, hyperlipidemia, NICM/ICM, combined systolic and diastolic CHF ef 57%, h/o VT, CKD stage IIIa, cocaine use, presents with complaints of shortness of breath and leg swelling, found to be in acute heart failure exacerbation  Subjective:   "I stopped using cocaine in the past, but I relapsed, they left me dead in the room when I overdosed three weeks ago, I am not going to do cocaine anymore" He currently desires drug rehab, he wants to talk to social worker He denies chest pain, no sob at rest, reports bilateral lower extremity edema has improved He reports chronic intermittent low back pain, left hip pain and left knee pain, reports walker helped  Assessment & Plan:   Principal Problem:   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) Active Problems:   Essential hypertension   Obesity (BMI 30-39.9)   Normochromic anemia   NSVT (nonsustained ventricular tachycardia) (HCC)   Troponin level elevated   Acute kidney injury superimposed on chronic kidney disease (HCC)   Prolonged QT interval   Palliative care by specialist   Adjustment disorder with mixed anxiety and depressed mood  Acute on chronic combined heart failure, mixed ischemic nonischemic cardiomyopathy -EF 10%, h/o VT -With medication noncompliance, cocaine abuse, ongoing alcohol use -Cardiology consulted, currently on Coreg, Farxiga, Entresto, spironolactone, Demadex -He is not a candidate for ICD given history of noncompliance and substance abuse per cardiology  H/o CAD Continue aspirin and statin Currently no chest pain, high-sensitivity troponin mild and flat at 26-28 Cardiology following  CKD 3a Monitor  creatinine while on Entresto spironolactone and Demadex    Depression/polysubstance abuse Seen by psychiatrist and social worker Dale [name] is a 60 year old man with a psychiatric history of cocaine use disorder, anxiety, and depression who was consulted to this service for depression.  On assessment, his symptoms are consistent with adjustment disorder secondary to losing friends, family, and housing.  At this time, he does not meet criteria for an inpatient psychiatric admission.   -Start hydroxyzine 25 mg twice daily as needed anxiety, and 50 mg nightly as needed for insomnia.  Medication approved by primary attending, Erin Hearing, MD. -Would like information for [facility] boarding house (patient gets [phone] per month in SSI) for once medically cleared. - Patient would like information for psychiatric evaluation forms in order to receive compensation of some sort. SW to kindly assist with these 2 items. -Information for [facility] and other outpatient therapy resources will be provided in discharge materials. -Patient would like to continue seeing the chaplain while admitted.   : Body mass index is 36.15 kg/m.Marland Kitchen  Homelessness, appreciate social worker input  [employer] (From admission, onward)     Start     Ordered   06/21/21 0500  Basic metabolic panel  Tomorrow morning,   R       Question:  Specimen collection method  Answer:  Lab=Lab collect   06/20/21 0757              DVT prophylaxis:   Lovenox   Code Status: Full Family Communication: Patient Disposition:   Status is: Inpatient  Dispo: The patient is from: home  Anticipated d/c is to: homeless, social worker assistance requested               Anticipated d/c date is: 1-2 days once cleared by cards , also needs Child psychotherapist assistance                Consultants:  Cardiology Palliative care Psychiatry  Procedures:  None  Antimicrobials:   None     Objective: Vitals:   06/19/21  2050 06/20/21 0315 06/20/21 1000 06/20/21 1001  BP: 107/72 104/69  (!) 88/66  Pulse: 85 80  81  Resp: 14 17 18    Temp: 98.6 F (37 C) 97.9 F (36.6 C)  97.8 F (36.6 C)  TempSrc: Oral Oral  Oral  SpO2: 98% 93%  98%  Weight:  117.6 kg    Height:        Intake/Output Summary (Last 24 hours) at 06/20/2021 1035 Last data filed at 06/20/2021 1000 Gross per 24 hour  Intake 1811 ml  Output 5425 ml  Net -3614 ml   Filed Weights   06/18/21 0354 06/19/21 0522 06/20/21 0315  Weight: 121.5 kg 120 kg 117.6 kg    Examination:  General exam: calm, NAD Respiratory system: Clear to auscultation. Respiratory effort normal. Cardiovascular system: S1 & S2 heard, RRR. Gastrointestinal system: Abdomen is nondistended, soft and nontender.  Normal bowel sounds heard. Central nervous system: Alert and oriented. No focal neurological deficits. Extremities: ted hosed on bilateral lower extremities, residual bilateral lower extremity edema Skin: No rashes, lesions or ulcers Psychiatry: Judgement and insight appear normal. Mood & affect appropriate.     Data Reviewed: I have personally reviewed following labs and imaging studies  CBC: No results for input(s): WBC, NEUTROABS, HGB, HCT, MCV, PLT in the last 168 hours.  Basic Metabolic Panel: Recent Labs  Lab 06/14/21 0646 06/15/21 0825 06/16/21 0424 06/17/21 06/19/21 06/18/21 0338 06/19/21 0407 06/20/21 0256  NA  --    < > 137 136 135 137 136  K  --    < > 3.7 3.8 3.9 4.3 4.2  CL  --    < > 101 102 101 100 98  CO2  --    < > 27 25 26 25 30   GLUCOSE  --    < > 87 104* 105* 85 97  BUN  --    < > 41* 35* 32* 29* 31*  CREATININE  --    < > 1.52* 1.34* 1.39* 1.36* 1.68*  CALCIUM  --    < > 8.6* 8.6* 8.7* 8.5* 9.0  MG 2.5*  --  1.9 2.0  --   --  2.3   < > = values in this interval not displayed.    GFR: Estimated Creatinine Clearance: 63.3 mL/min (A) (by C-G formula based on SCr of 1.68 mg/dL (H)).  Liver Function Tests: No results for  input(s): AST, ALT, ALKPHOS, BILITOT, PROT, ALBUMIN in the last 168 hours.  CBG: No results for input(s): GLUCAP in the last 168 hours.   Recent Results (from the past 240 hour(s))  SARS CORONAVIRUS 2 (TAT 6-24 HRS) Nasopharyngeal Nasopharyngeal Swab     Status: None   Collection Time: 06/13/21 11:25 AM   Specimen: Nasopharyngeal Swab  Result Value Ref Range Status   SARS Coronavirus 2 NEGATIVE NEGATIVE Final    Comment: (NOTE) SARS-CoV-2 target nucleic acids are NOT DETECTED.  The SARS-CoV-2 RNA is generally detectable in upper and lower respiratory specimens during the acute phase of infection. Negative  results do not preclude SARS-CoV-2 infection, do not rule out co-infections with other pathogens, and should not be used as the sole basis for treatment or other patient management decisions. Negative results must be combined with clinical observations, patient history, and epidemiological information. The expected result is Negative.  Fact Sheet for Patients: HairSlick.no  Fact Sheet for Healthcare Providers: quierodirigir.com  This test is not yet approved or cleared by the [place] FDA and  has been authorized for detection and/or diagnosis of SARS-CoV-2 by FDA under an Emergency Use Authorization (EUA). This EUA will remain  in effect (meaning this test can be used) for the duration of the COVID-19 declaration under Se ction 564(b)(1) of the Act, 21 U.S.C. section 360bbb-3(b)(1), unless the authorization is terminated or revoked sooner.  Performed at [facility], [address] [place], [place] [address]          Radiology Studies: No results found.      Scheduled Meds:  aspirin EC  81 mg Oral Daily   atorvastatin  40 mg Oral Daily   carvedilol  3.125 mg Oral BID WC   dapagliflozin propanediol  10 mg Oral Daily   enoxaparin (LOVENOX) injection  60 mg Subcutaneous Q24H   gabapentin  300  mg Oral QHS   potassium chloride SA  20 mEq Oral Daily   sacubitril-valsartan  1 tablet Oral BID   sodium chloride flush  3 mL Intravenous Q12H   spironolactone  25 mg Oral Daily   torsemide  40 mg Oral BID   Continuous Infusions:  sodium chloride       LOS: 7 days   Time spent: Greater than 50% of this time was spent in counseling, explanation of diagnosis, planning of further management, and coordination of care.   Voice Recognition Reubin Milan dictation system was used to create this note, attempts have been made to correct errors. Please contact the author with questions and/or clarifications.   Albertine Grates, MD PhD FACP [facility]  Available via Epic secure chat 7am-7pm for nonurgent issues Please page for urgent issues To page the attending provider between 7A-7P or the covering provider during after hours 7P-7A, please log into the web site [email or web address] and access using universal [place] password for that web site. If you do not have the password, please call the hospital operator.    06/20/2021, 10:35 AM

## 2021-06-20 NOTE — Progress Notes (Signed)
Heart Failure Stewardship Pharmacist Progress Note   PCP: Nahser, Deloris Ping, MD PCP-Cardiologist: Kristeen Miss, MD    HPI:  60 yo M with PMH of CAD, CKD III, mixed ischemic and nonischemic cardiomyopathy, HTN, HLD, medication noncompliance, alcohol and cocaine abuse, and VT. He presented to the ED on 8/10 with chest pain, shortness of breath, LE edema, and reported noncompliance with torsemide. He states he ran out of his torsemide 3-4 days prior to admission. He was recently hospitalized on 7/29-8/2 at [facility] for acute systolic heart failure. He left AMA. Last ECHO done on 05/12/21 and LVEF was <10% with G2DD and severe RV systolic dysfunction. All prior ECHOs reporting back to 09/2018 have shown LVEF ~20% and less. UDS on admission positive for THC. Patient states last cocaine use was approximately 2 weeks ago. CXR showed no active cardiopulmonary disease. Last R/LHC done in July 2020 and found to have severe single vessel CAD with chronic total occlusion of proximal RCA (RA 17, PA 25, wedge 24, CO 7.7, CI 3.4).  Current HF Medications: Torsemide 40 mg BID Carvedilol 3.125 mg BID Entresto 24/26 mg BID Spironolactone 25 mg daily Farxiga 10 mg daily  Prior to admission HF Medications: Torsemide 40 mg daily Carvedilol 6.25 mg BID Losartan 12.5 mg daily  Pertinent Lab Values: Serum creatinine 1.36>1.68, BUN 31, Potassium 4.2, Sodium 136, BNP 2895.8, Magnesium 2.3 A1c 6.1 (05/11/21) I/O: Net -23L since admission   Vital Signs: Weight: 259 lbs (admission weight: 275 lbs) Blood pressure: 110/70s  Heart rate: 80s   Medication Assistance / Insurance Benefits Check: Does the patient have prescription insurance?  Yes Type of insurance plan: [place] Medicaid  Outpatient Pharmacy:  Prior to [facility]: [facility] Is the patient willing to use [facility] TOC pharmacy at discharge? Yes Is the patient willing to transition their outpatient pharmacy to utilize a [facility] outpatient pharmacy?    Pending    Assessment: 1. Acute on chronic systolic CHF (EF <32%), due to mixed ischemic and nonischemic cardiomyopathy. NYHA class III symptoms. - Agree with transitioning to torsemide 40 mg BID - Continue carvedilol 3.125 mg BID  - Continue Entresto 24/26 mg BID - Continue spironolactone 25 mg daily - Continue Farxiga 10 mg daily   Plan: 1) Medication changes recommended at this time: - Agree with changes as above  2) Patient assistance: - Patient has [place] Medicaid - all copays should be $0-4 per month and copays can be waived at [facility] pharmacies if he cannot afford his prescriptions - HF TOC appt scheduled for 8/22 - Appreciate assistance from HF CSW and RNCM. Further follow up of SDOH needs will be provided by outpatient CSW.  3)  Education  - Patient has been educated on current HF medications and potential additions to HF medication regimen - Patient verbalizes understanding that over the next few months, these medication doses may change and more medications may be added to optimize HF regimen - Patient has been educated on basic disease state pathophysiology and goals of therapy  Sharen Hones, PharmD, BCPS Heart Failure Stewardship Pharmacist Phone [phone]

## 2021-06-20 NOTE — Progress Notes (Signed)
Progress Note  Patient Name: Dale [name] Date of Encounter: 06/20/2021  [facility] Cardiologist: Kristeen Miss, MD   Subjective   Pt denies CP or dyspnea  Inpatient Medications    Scheduled Meds:  aspirin EC  81 mg Oral Daily   atorvastatin  40 mg Oral Daily   carvedilol  3.125 mg Oral BID WC   dapagliflozin propanediol  10 mg Oral Daily   enoxaparin (LOVENOX) injection  60 mg Subcutaneous Q24H   furosemide  80 mg Intravenous BID   gabapentin  300 mg Oral QHS   potassium chloride SA  20 mEq Oral Daily   sacubitril-valsartan  1 tablet Oral BID   sodium chloride flush  3 mL Intravenous Q12H   spironolactone  25 mg Oral Daily   Continuous Infusions:  sodium chloride     PRN Meds: sodium chloride, acetaminophen, diclofenac sodium, hydrOXYzine, hydrOXYzine, ondansetron (ZOFRAN) IV, sodium chloride flush   Vital Signs    Vitals:   06/19/21 0917 06/19/21 1242 06/19/21 2050 06/20/21 0315  BP: 104/74 95/63 107/72 104/69  Pulse:  81 85 80  Resp:  18 14 17   Temp:  97.7 F (36.5 C) 98.6 F (37 C) 97.9 F (36.6 C)  TempSrc:  Oral Oral Oral  SpO2:  100% 98% 93%  Weight:    117.6 kg  Height:        Intake/Output Summary (Last 24 hours) at 06/20/2021 0744 Last data filed at 06/20/2021 0323 Gross per 24 hour  Intake 1943 ml  Output 6400 ml  Net -4457 ml    Last 3 Weights 06/20/2021 06/19/2021 06/18/2021  Weight (lbs) 259 lb 3.2 oz 264 lb 8.8 oz 267 lb 12.8 oz  Weight (kg) 117.572 kg 120 kg 121.473 kg      Telemetry    Sinus with PVCs and 5beats NSVT- Personally Reviewed   Physical Exam   GEN: NAD, WD, obese Neck: supple, JVP difficult to assess Cardiac: RRR, no rub Respiratory: CTA; no wheeze GI: Soft, NT/ND, no masses MS: 1+ edema Neuro:  No focal findings Psych: Normal affect   Labs    High Sensitivity Troponin:   Recent Labs  Lab 06/01/21 2000 06/01/21 2153 06/13/21 0057 06/13/21 0250  TROPONINIHS 1,279* 1,127* 28* 26*        Chemistry Recent Labs  Lab 06/18/21 0338 06/19/21 0407 06/20/21 0256  NA 135 137 136  K 3.9 4.3 4.2  CL 101 100 98  CO2 26 25 30   GLUCOSE 105* 85 97  BUN 32* 29* 31*  CREATININE 1.39* 1.36* 1.68*  CALCIUM 8.7* 8.5* 9.0  GFRNONAA 59* >60 47*  ANIONGAP 8 12 8         Patient Profile     60 y.o. male with past medical history of coronary artery disease, chronic stage III kidney disease, mixed nonischemic/ischemic cardiomyopathy, hypertension, hyperlipidemia, substance abuse, noncompliance, ventricular tachycardia admitted with acute on chronic combined systolic/diastolic congestive heart failure.  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  Last echocardiogram showed ejection fraction less than 10%, severe left ventricular enlargement, grade 2 diastolic dysfunction, severe right ventricular enlargement with severe RV dysfunction, severe biatrial enlargement, mild mitral regurgitation, mild to moderate tricuspid regurgitation.  Assessment & Plan    1 acute on chronic combined systolic/diastolic congestive heart failure-weight-117.6 kg I/O since admission -22329.  Volume status continues to improve.  We will discontinue IV Lasix and treat with Demadex 40 mg twice  daily.  Continue to follow renal function closely.  2 mixed ischemic/nonischemic cardiomyopathy-blood pressure appears to be tolerating Entresto 24/26 twice daily; will continue.  We will continue carvedilol and advance as an outpatient as tolerated by pulse and blood pressure.  Continue Farxiga.  As outlined previously he is not a candidate for ICD given history of noncompliance and substance abuse.    3 chronic stage IIIa kidney disease-recheck BUN and creatinine tomorrow morning.  4 noncompliance-we discussed importance of complying with medications.  He will likely need follow-up in the advanced heart failure clinic after  discharge.  5 history of substance abuse-needs to discontinue.  6 coronary artery disease-continue aspirin and statin.  Possible discharge tomorrow morning if stable.  Ambulate today.  For questions or updates, please contact [facility] Please consult [email or web address] for contact info under        Signed, Olga Millers, MD  06/20/2021, 7:44 AM

## 2021-06-20 NOTE — Social Work (Signed)
CSW spoke with pt in room, pt is concerned about not having anywhere to go at DC. Pt states he was in a sobriety group home then after the program was up he moved in with a friend where he relapsed and admitted to OD from using cocaine. Pt stated that he does not want to die and he knows he has serious health problem and does not want to go back to his friends house because they left him to die after he OD there. CSW provided resources to [facility] [employer] [facility] and asked pt to go ahead and call while CSW as in the room to help him navigate any questions or concerns. Pt interviewed over the phone and they offered home a bed but it has a weekly cost of $120 and a deposit of $280. Pt would need $400 total to move in and states that he will not get his disability check until the first of the month and wants CSW to reach out to the HF team to see if he could get those funds. CSW spoke with representative a the halfway house and he confirmed information and stated this amount will get pt through until he gets his disability check on the first. CSW reached out to [employer] to inform her on pt needs. Cortlin will follow up tomorrow with HF team to see if any of pt needs can be met. CSW will follow up with pt tomorrow before DC.

## 2021-06-20 NOTE — Progress Notes (Signed)
Pt said that he will not wear tele because he is going to get discharged.

## 2021-06-20 NOTE — Progress Notes (Addendum)
This chaplain responded to the MD consult for Pt. F/U spiritual care. The Pt. is sitting up on the bedside and welcomes the chaplain's presence.   The chaplain understands the Pt. is anxious about his upcoming discharge. The chaplain listens reflectively as the Pt. explains his housing choices from the resources he has been given.  The Pt. is hopeful he will find housing in a situation that will support his choices for substance abuse rehab. The chaplain understands the Pt. Is looking for support from SW for this transition.  The chaplain provided Advance Directive: HCPOA and Living Will education as part of the Pt. choice to create a team that is supportive of his choices. The chaplain understands the Pt. is choosing [facility] as his healthcare agent.  The Pt. RN-Eun updated his emergency contacts in Epic with his HCPOA. The chaplain will schedule a notary visit on Thursday.  The chaplain placed the Pt. AD is in his chart.  The chaplain understands the Pt. main source of strength is his faith. The chaplain understands the Pt. has children and grandchildren he hopes to spend more time with.   The chaplain is available for F/U spiritual care as needed.

## 2021-06-21 ENCOUNTER — Other Ambulatory Visit (HOSPITAL_COMMUNITY): Payer: Self-pay

## 2021-06-21 DIAGNOSIS — I5040 Unspecified combined systolic (congestive) and diastolic (congestive) heart failure: Secondary | ICD-10-CM | POA: Diagnosis not present

## 2021-06-21 LAB — BASIC METABOLIC PANEL
Anion gap: 9 (ref 5–15)
BUN: 30 mg/dL — ABNORMAL HIGH (ref 6–20)
CO2: 28 mmol/L (ref 22–32)
Calcium: 8.8 mg/dL — ABNORMAL LOW (ref 8.9–10.3)
Chloride: 99 mmol/L (ref 98–111)
Creatinine, Ser: 1.57 mg/dL — ABNORMAL HIGH (ref 0.61–1.24)
GFR, Estimated: 51 mL/min — ABNORMAL LOW (ref >60)
Glucose, Bld: 92 mg/dL (ref 70–99)
Potassium: 4.4 mmol/L (ref 3.5–5.1)
Sodium: 136 mmol/L (ref 135–145)

## 2021-06-21 MED ORDER — CARVEDILOL 3.125 MG PO TABS
3.1250 mg | ORAL_TABLET | Freq: Two times a day (BID) | ORAL | 0 refills | Status: DC
  Filled 2021-06-21: qty 60, 30d supply, fill #0

## 2021-06-21 MED ORDER — HYDROXYZINE HCL 25 MG PO TABS
50.0000 mg | ORAL_TABLET | Freq: Every evening | ORAL | 0 refills | Status: DC | PRN
  Filled 2021-06-21: qty 60, 30d supply, fill #0

## 2021-06-21 MED ORDER — ACETAMINOPHEN 325 MG PO TABS: 650.0000 mg | ORAL_TABLET | ORAL | Status: DC | PRN

## 2021-06-21 MED ORDER — TORSEMIDE 20 MG PO TABS
40.0000 mg | ORAL_TABLET | Freq: Two times a day (BID) | ORAL | 0 refills | Status: DC
  Filled 2021-06-21: qty 120, 30d supply, fill #0

## 2021-06-21 MED ORDER — DAPAGLIFLOZIN PROPANEDIOL 10 MG PO TABS
10.0000 mg | ORAL_TABLET | Freq: Every day | ORAL | 0 refills | Status: DC
  Filled 2021-06-21: qty 30, 30d supply, fill #0

## 2021-06-21 MED ORDER — SPIRONOLACTONE 25 MG PO TABS
25.0000 mg | ORAL_TABLET | Freq: Every day | ORAL | 0 refills | Status: DC
  Filled 2021-06-21: qty 30, 30d supply, fill #0

## 2021-06-21 MED ORDER — SACUBITRIL-VALSARTAN 24-26 MG PO TABS
1.0000 | ORAL_TABLET | Freq: Two times a day (BID) | ORAL | 0 refills | Status: DC
  Filled 2021-06-21: qty 60, 30d supply, fill #0

## 2021-06-21 MED ORDER — HYDROXYZINE HCL 25 MG PO TABS
25.0000 mg | ORAL_TABLET | Freq: Two times a day (BID) | ORAL | 0 refills | Status: DC | PRN
  Filled 2021-06-21: qty 90, 30d supply, fill #0

## 2021-06-21 NOTE — Discharge Summary (Signed)
Discharge Summary  Dale [name] OIZ:[id number] DOB: 1961/11/01  PCP: Vesta Mixer, MD  Admit date: 06/13/2021 Discharge date: 06/21/2021  Time spent: , more than 50% time spent on coordination of care.   Recommendations for Outpatient Follow-up:  F/u with PCP within a week  for hospital discharge follow up, repeat cbc/bmp at follow up F/u with cardiology/heart failure clinic on 8/22 F/u with cardiology Dr Elease Hashimoto on 11/2 Social worker to assist with disposition and substance abuse resources  D/c meds delivered to bedside before discharge     Discharge Diagnoses:  Active Hospital Problems   Diagnosis Date Noted   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) 06/13/2021   Adjustment disorder with mixed anxiety and depressed mood    Palliative care by specialist    Prolonged QT interval 06/14/2021   Acute kidney injury superimposed on chronic kidney disease (HCC) 10/16/2020   Troponin level elevated 03/11/2019   NSVT (nonsustained ventricular tachycardia) (HCC) 03/03/2019   Normochromic anemia 09/28/2018   Obesity (BMI 30-39.9) 03/11/2016   Essential hypertension 03/11/2016    Resolved Hospital Problems  No resolved problems to display.    Discharge Condition: stable  Diet recommendation: heart healthy  Filed Weights   06/19/21 0522 06/20/21 0315 06/21/21 0421  Weight: 120 kg 117.6 kg 116.1 kg    History of present illness:  Dale [name] is a 60 y.o. male with medical history hypertension, hyperlipidemia, NICM/ICM, combined systolic and diastolic CHF ef 33%, h/o VT, CKD stage IIIa, cocaine use, medication/medical follow up noncompliance presents with complaints of shortness of breath and leg swelling, found to be in acute heart failure exacerbation  Hospital Course:  Principal Problem:   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) Active Problems:   Essential hypertension    Obesity (BMI 30-39.9)   Normochromic anemia   NSVT (nonsustained ventricular tachycardia) (HCC)   Troponin level elevated   Acute kidney injury superimposed on chronic kidney disease (HCC)   Prolonged QT interval   Palliative care by specialist   Adjustment disorder with mixed anxiety and depressed mood  Acute on chronic combined heart failure, mixed ischemic nonischemic cardiomyopathy -EF 10%, h/o VT -With medication/medical follow up noncompliance, cocaine abuse, ongoing alcohol use -Cardiology consulted, currently on Coreg, Farxiga, Entresto, spironolactone, Demadex -He is not a candidate for ICD given history of noncompliance and substance abuse per cardiology -he is feeling better, he is cleared to discharge from hospital by cardiology, cardiology follow up appointment arranged    H/o CAD Continue aspirin and statin Currently no chest pain, high-sensitivity troponin mild and flat at 26-28 Cardiology input appreciated    CKD 3a Monitor creatinine while on Entresto spironolactone and Demadex  f/u with pcp/cardiology, pcp to refer to nephrology     Depression/polysubstance abuse, Seen by psychiatrist and social worker Per psychiatry: [name] is a 60 year old man with a psychiatric history of cocaine use disorder, anxiety, and depression who was consulted to this service for depression.  On assessment, his symptoms are consistent with adjustment disorder secondary to losing friends, family, and housing.  At this time, he does not meet criteria for an inpatient psychiatric admission.   -Start hydroxyzine 25 mg twice daily as needed anxiety, and 50 mg nightly as needed for insomnia.  Medication approved by primary attending, Erin Hearing, MD. -Would like information for [facility] (patient gets [phone] per month in SSI) for once medically cleared. - Patient would like information  for psychiatric evaluation forms in order to receive compensation of some sort. SW to  kindly assist with these 2 items. -Information for [facility] and other outpatient therapy resources will be provided in discharge materials. -Patient would like to continue seeing the chaplain while admitted.     : Body mass index is 36.15 kg/m.Dale [name]   Homelessness, appreciate social worker input    Procedures: none  Consultations: Cardiology Palliative care  Psychiatry  Social worker  Discharge Exam: BP 104/88 (BP Location: Left Arm)   Pulse 89   Temp 97.7 F (36.5 C) (Oral)   Resp 19   Ht 5\' 11"  (1.803 m)   Wt 116.1 kg   SpO2 99%   BMI 35.70 kg/m   General: NAD Cardiovascular: RRR Respiratory: normal respiratory effort, on room air  Discharge Instructions You were cared for by a hospitalist during your hospital stay. If you have any questions about your discharge medications or the care you received while you were in the hospital after you are discharged, you can call the unit and asked to speak with the hospitalist on call if the hospitalist that took care of you is not available. Once you are discharged, your primary care physician will handle any further medical issues. Please note that NO REFILLS for any discharge medications will be authorized once you are discharged, as it is imperative that you return to your primary care physician (or establish a relationship with a primary care physician if you do not have one) for your aftercare needs so that they can reassess your need for medications and monitor your lab values.  Discharge Instructions     Diet - low sodium heart healthy   Complete by: As directed    Increase activity slowly   Complete by: As directed       Allergies as of 06/21/2021       Reactions   Hydrocodone Itching        Medication List     STOP taking these medications    losartan 25 MG tablet Commonly known as: COZAAR   potassium chloride SA 20 MEQ tablet Commonly known as: KLOR-CON       TAKE these medications    acetaminophen  325 MG tablet Commonly known as: TYLENOL Take 2 tablets (650 mg total) by mouth every 4 (four) hours as needed for headache or mild pain.   aspirin 81 MG EC tablet Take 1 tablet (81 mg total) by mouth daily. Swallow whole.   atorvastatin 40 MG tablet Commonly known as: LIPITOR Take 1 tablet (40 mg total) by mouth daily.   carvedilol 3.125 MG tablet Commonly known as: COREG Take 1 tablet (3.125 mg total) by mouth 2 (two) times daily with a meal. What changed:  medication strength how much to take   dapagliflozin propanediol 10 MG Tabs tablet Commonly known as: FARXIGA Take 1 tablet (10 mg total) by mouth daily.   diclofenac sodium 1 % Gel Commonly known as: VOLTAREN Apply 2 g topically 4 (four) times daily as needed (pain).   ferrous sulfate 325 (65 FE) MG EC tablet Take 325 mg by mouth daily with breakfast.   gabapentin 300 MG capsule Commonly known as: NEURONTIN Take 1 capsule (300 mg total) by mouth at bedtime.   hydrOXYzine 25 MG tablet Commonly known as: ATARAX/VISTARIL Take 1 tablet (25 mg total) by mouth 2 (two) times daily as needed for anxiety.   hydrOXYzine 50 MG tablet Commonly known as: ATARAX/VISTARIL Take 1 tablet (50  mg total) by mouth at bedtime as needed (insomnia).   sacubitril-valsartan 24-26 MG Commonly known as: ENTRESTO Take 1 tablet by mouth 2 (two) times daily.   spironolactone 25 MG tablet Commonly known as: ALDACTONE Take 1 tablet (25 mg total) by mouth daily. Start taking on: June 22, 2021   Torsemide 40 MG Tabs Take 40 mg by mouth 2 (two) times daily. What changed: when to take this               Durable Medical Equipment  (From admission, onward)           Start     Ordered   06/14/21 1109  For home use only DME 4 wheeled rolling walker with seat  Once       Question:  Patient needs a walker to treat with the following condition  Answer:  CHF (congestive heart failure) (HCC)   06/14/21 1109            Allergies  Allergen Reactions   Hydrocodone Itching    Follow-up Information     [facility] Follow up.   Why: for anxiety/depression Contact information: [address] [place] [address] [phone]         f/u with pcp for hospital discharge follow up Follow up.                   The results of significant diagnostics from this hospitalization (including imaging, microbiology, ancillary and laboratory) are listed below for reference.    Significant Diagnostic Studies: DG Chest 2 View  Result Date: 06/13/2021 CLINICAL DATA:  Shortness of breath EXAM: CHEST - 2 VIEW COMPARISON:  06/01/2021 FINDINGS: Cardiac shadow is enlarged but stable. The lungs are well aerated bilaterally. No focal infiltrate or effusion is seen. No bony abnormality is noted. IMPRESSION: No active cardiopulmonary disease. Electronically Signed   By: Alcide Clever M.D.   On: 06/13/2021 01:11   DG Chest 2 View  Result Date: 06/01/2021 CLINICAL DATA:  Shortness of breath EXAM: CHEST - 2 VIEW COMPARISON:  05/25/2021 FINDINGS: Cardiomegaly, vascular congestion. No overt edema. No confluent opacities or effusions. No acute bony abnormality. IMPRESSION: Cardiomegaly, vascular congestion. Electronically Signed   By: Charlett Nose M.D.   On: 06/01/2021 20:07    Microbiology: Recent Results (from the past 240 hour(s))  SARS CORONAVIRUS 2 (TAT 6-24 HRS) Nasopharyngeal Nasopharyngeal Swab     Status: None   Collection Time: 06/13/21 11:25 AM   Specimen: Nasopharyngeal Swab  Result Value Ref Range Status   SARS Coronavirus 2 NEGATIVE NEGATIVE Final    Comment: (NOTE) SARS-CoV-2 target nucleic acids are NOT DETECTED.  The SARS-CoV-2 RNA is generally detectable in upper and lower respiratory specimens during the acute phase of infection. Negative results do not preclude SARS-CoV-2 infection, do not rule out co-infections with other pathogens, and should not be used as the sole  basis for treatment or other patient management decisions. Negative results must be combined with clinical observations, patient history, and epidemiological information. The expected result is Negative.  Fact Sheet for Patients: HairSlick.no  Fact Sheet for Healthcare Providers: quierodirigir.com  This test is not yet approved or cleared by the [place] FDA and  has been authorized for detection and/or diagnosis of SARS-CoV-2 by FDA under an Emergency Use Authorization (EUA). This EUA will remain  in effect (meaning this test can be used) for the duration of the COVID-19 declaration under Se ction 564(b)(1) of the Act, 21 U.S.C.  section 360bbb-3(b)(1), unless the authorization is terminated or revoked sooner.  Performed at [facility], [address], [place], [place] [address]      Labs: Basic Metabolic Panel: Recent Labs  Lab 06/16/21 0424 06/17/21 6578 06/18/21 0338 06/19/21 0407 06/20/21 0256 06/21/21 0032  NA 137 136 135 137 136 136  K 3.7 3.8 3.9 4.3 4.2 4.4  CL 101 102 101 100 98 99  CO2 27 25 26 25 30 28   GLUCOSE 87 104* 105* 85 97 92  BUN 41* 35* 32* 29* 31* 30*  CREATININE 1.52* 1.34* 1.39* 1.36* 1.68* 1.57*  CALCIUM 8.6* 8.6* 8.7* 8.5* 9.0 8.8*  MG 1.9 2.0  --   --  2.3  --    Liver Function Tests: No results for input(s): AST, ALT, ALKPHOS, BILITOT, PROT, ALBUMIN in the last 168 hours. No results for input(s): LIPASE, AMYLASE in the last 168 hours. No results for input(s): AMMONIA in the last 168 hours. CBC: No results for input(s): WBC, NEUTROABS, HGB, HCT, MCV, PLT in the last 168 hours. Cardiac Enzymes: No results for input(s): CKTOTAL, CKMB, CKMBINDEX, TROPONINI in the last 168 hours. BNP: BNP (last 3 results) Recent Labs    06/01/21 2109 06/04/21 0443 06/13/21 0057  BNP 4,001.7* 3,871.2* 2,895.8*    ProBNP (last 3 results) No results for input(s): PROBNP in the last 8760  hours.  CBG: No results for input(s): GLUCAP in the last 168 hours.     Signed:  Albertine Grates MD, PhD, FACP  [facility] 06/21/2021, 12:03 PM

## 2021-06-21 NOTE — Progress Notes (Signed)
Progress Note  Patient Name: Dale [name] Date of Encounter: 06/21/2021  [facility] Cardiologist: Kristeen Miss, MD   Subjective   No CP or dyspnea  Inpatient Medications    Scheduled Meds:  aspirin EC  81 mg Oral Daily   atorvastatin  40 mg Oral Daily   carvedilol  3.125 mg Oral BID WC   dapagliflozin propanediol  10 mg Oral Daily   enoxaparin (LOVENOX) injection  60 mg Subcutaneous Q24H   gabapentin  300 mg Oral QHS   potassium chloride SA  20 mEq Oral Daily   sacubitril-valsartan  1 tablet Oral BID   sodium chloride flush  3 mL Intravenous Q12H   spironolactone  25 mg Oral Daily   torsemide  40 mg Oral BID   Continuous Infusions:  sodium chloride     PRN Meds: sodium chloride, acetaminophen, diclofenac sodium, hydrOXYzine, hydrOXYzine, ondansetron (ZOFRAN) IV, sodium chloride flush   Vital Signs    Vitals:   06/20/21 1158 06/20/21 1933 06/21/21 0421 06/21/21 0930  BP: 112/77 110/76 (!) 116/92 102/80  Pulse: 83 90 92 86  Resp: 19 14 14    Temp: 97.7 F (36.5 C) 98.2 F (36.8 C) 98.8 F (37.1 C)   TempSrc: Oral Oral Oral   SpO2: 92% 98% 96%   Weight:   116.1 kg   Height:        Intake/Output Summary (Last 24 hours) at 06/21/2021 1004 Last data filed at 06/21/2021 0424 Gross per 24 hour  Intake 1543 ml  Output 2812 ml  Net -1269 ml    Last 3 Weights 06/21/2021 06/20/2021 06/19/2021  Weight (lbs) 256 lb 259 lb 3.2 oz 264 lb 8.8 oz  Weight (kg) 116.121 kg 117.572 kg 120 kg      Telemetry    Sinus with PVCs Personally Reviewed   Physical Exam   GEN: WD NAD Neck: supple, Cardiac: RRR Respiratory: CTA; no rhonchi GI: Soft, NT/ND MS: trace edema Neuro:  grossly intact Psych: Normal affect   Labs    High Sensitivity Troponin:   Recent Labs  Lab 06/01/21 2000 06/01/21 2153 06/13/21 0057 06/13/21 0250  TROPONINIHS 1,279* 1,127* 28* 26*       Chemistry Recent Labs  Lab 06/19/21 0407 06/20/21 0256 06/21/21 0032  NA 137 136  136  K 4.3 4.2 4.4  CL 100 98 99  CO2 25 30 28   GLUCOSE 85 97 92  BUN 29* 31* 30*  CREATININE 1.36* 1.68* 1.57*  CALCIUM 8.5* 9.0 8.8*  GFRNONAA >60 47* 51*  ANIONGAP 12 8 9    No results for input(s): BNP, PROBNP in the last 168 hours.     Patient Profile     60 y.o. male with past medical history of coronary artery disease, chronic stage III kidney disease, mixed nonischemic/ischemic cardiomyopathy, hypertension, hyperlipidemia, substance abuse, noncompliance, ventricular tachycardia admitted with acute on chronic combined systolic/diastolic congestive heart failure.  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  Last echocardiogram showed ejection fraction less than 10%, severe left ventricular enlargement, grade 2 diastolic dysfunction, severe right ventricular enlargement with severe RV dysfunction, severe biatrial enlargement, mild mitral regurgitation, mild to moderate tricuspid regurgitation.  Assessment & Plan    1 acute on chronic combined systolic/diastolic congestive heart failure-weight-116.1 kg I/O since admission .  Volume status much improved.  Continue Demadex 40 mg twice daily.  2 mixed ischemic/nonischemic cardiomyopathy-blood pressure appears to be tolerating Entresto 24/26  twice daily; will continue.  We will continue carvedilol and advance as an outpatient as tolerated by pulse and blood pressure.  Continue Farxiga.  As outlined previously he is not a candidate for ICD given history of noncompliance and substance abuse.    3 chronic stage IIIa kidney disease-renal function unchanged today.  4 noncompliance-we discussed importance of complying with medications.  He will likely need follow-up in the advanced heart failure clinic after discharge.  5 history of substance abuse-needs to discontinue.  6 coronary artery disease-continue aspirin and statin.  Patient can  be discharged from a cardiac standpoint on present medications.  He is scheduled to be seen in the advanced heart failure clinic on August 22.  Check potassium and renal function at that time.  Would arrange follow-up with Dr. Elease Hashimoto in 3 months.  We will sign off.  Please call with questions.  For questions or updates, please contact [facility] Please consult [email or web address] for contact info under        Signed, Olga Millers, MD  06/21/2021, 10:04 AM

## 2021-06-21 NOTE — TOC Transition Note (Addendum)
Transition of Care (TOC) - CM/SW Progression Note Heart Failure   Patient Details  Name: Dartanion Teo MRN: [id number] Date of Birth: 1961-10-28  Transition of Care [facility]) CM/SW Contact:  Samir Ishaq, LCSWA Phone Number: 06/21/2021, 12:22 PM   Clinical Narrative:    CSW received information from the 3E CSW regarding Mr. Salem about needing help with a deposit for a halfway house at [facility]. CSW spoke with Thadius over the phone, [phone] at [facility] abut the $280 deposit and $120 for the first week of rent and that this could be paid online on their website and Mr. Kurdziel could discharge there today after 3pm. The Advanced HF team outpatient social worker, Annice Pih was prepared to help Mr. Icenhour with the deposit and first week's rent and provide him with another phone before discharging today. CSW received notice from 3E administration staff that Mr. Taha is ready to leave and is awaiting to speak with the CSW. The CSW and the Advanced HF outpatient social worker, Annice Pih both spoke with Mr. Crossley at bedside. Mr. Sagar reported that he "spoke with a Merian Capron from [facility] [phone] who said they may have availability for him today or tomorrow and that Merian Capron will pay the $125 deposit for Mr. Ferencz and that [facility] halfway home is trying to rip him off and to stay at the [facility] instead. Mr. Castrillon reported that he will have [facility] take him to where he needs to go to the [facility] at [address] to get his clothes." CSW, Annice Pih reported that isn't the best idea for him and he should stay far away from that hotel as Mr. Lafond reported last using substances there. Mr. Amescua reported that he "has no desire to use substances and is ready to make changes and that he is ready to discharge right now and asked for his IV to be removed so that he can leave." CSW informed Mr.  Brunkhorst that the nurse will need to remove his IV and provide him with his discharge paperwork and medications before he can leave.  12:56pm - CSW received a secure chat from the patient's nurse about speaking with Mr. Sexson. CSW spoke with Mr. Akamine who stated that he needs help with rent at the halfway house. CSW reviewed with Mr. Bradburn that Annice Pih and the CSW just spoke with him in his room this morning and that he stated that Merian Capron was going to pay for the $125 for the halfway house and that he didn't need any assistance. Mr. Blais proceeded to get frustrated and reported not understanding what is going on and wanting to talk with the case manager he spoke with yesterday. CSW passed this information along to CSW, Symone.  If needed CSW can arrange for [facility] at discharge.  CSW was notified that patient discharged and [facility] had been called. CSW will sign off for now as social work intervention is no longer needed. Please consult [place] again if new needs arise.    Barriers to Discharge: Continued Medical Work up   Patient Goals and CMS Choice Patient states their goals for this hospitalization and ongoing recovery are:: wants to go back to drug rehab CMS Medicare.gov Compare Post Acute Care list provided to:: Patient Choice offered to / list presented to : Patient  Discharge Placement  Discharge Plan and Services In-house Referral: Clinical Social Work Discharge Planning Services: CM Consult Post Acute Care Choice: Durable Medical Equipment          DME Arranged: Walker rolling with seat DME Agency: [employer] Date DME Agency Contacted: 06/14/21 Time DME Agency Contacted: 1118 Representative spoke with at DME Agency: Violet Baldy            Social Determinants of Health (SDOH) Interventions Food Insecurity Interventions: Assist with [employer] Application Financial Strain Interventions: Artist Housing  Interventions: Other (Comment), [id number] Referral Transportation Interventions: [facility]   Readmission Risk Interventions Readmission Risk Prevention Plan 12/15/2020 09/11/2020 03/03/2019  Transportation Screening Complete Complete Complete  PCP or Specialist Appt within 3-5 Days - Complete -  HRI or Home Care Consult - Complete -  Social Work Consult for Recovery Care Planning/Counseling - Complete -  Palliative Care Screening - Complete -  Medication Review Oceanographer) Complete Complete Complete  PCP or Specialist appointment within 3-5 days of discharge Complete - Complete  HRI or Home Care Consult Complete - (No Data)  SW Recovery Care/Counseling Consult Complete - Complete  Palliative Care Screening Not Applicable - Not Applicable  Skilled Nursing Facility Not Applicable - Not Applicable  Some recent data might be hidden     Nycere Presley, MSW, LCSWA [phone] Heart Failure Social Worker

## 2021-06-21 NOTE — Progress Notes (Signed)
Heart Failure Stewardship Pharmacist Progress Note   PCP: Nahser, Deloris Ping, MD PCP-Cardiologist: Kristeen Miss, MD    HPI:  60 yo M with PMH of CAD, CKD III, mixed ischemic and nonischemic cardiomyopathy, HTN, HLD, medication noncompliance, alcohol and cocaine abuse, and VT. He presented to the ED on 8/10 with chest pain, shortness of breath, LE edema, and reported noncompliance with torsemide. He states he ran out of his torsemide 3-4 days prior to admission. He was recently hospitalized on 7/29-8/2 at [facility] for acute systolic heart failure. He left AMA. Last ECHO done on 05/12/21 and LVEF was <10% with G2DD and severe RV systolic dysfunction. All prior ECHOs reporting back to 09/2018 have shown LVEF ~20% and less. UDS on admission positive for THC. Patient states last cocaine use was approximately 2 weeks piror to admission. CXR showed no active cardiopulmonary disease. Last R/LHC done in July 2020 and found to have severe single vessel CAD with chronic total occlusion of proximal RCA (RA 17, PA 25, wedge 24, CO 7.7, CI 3.4).  Current HF Medications: Torsemide 40 mg BID Carvedilol 3.125 mg BID Entresto 24/26 mg BID Spironolactone 25 mg daily Farxiga 10 mg daily  Prior to admission HF Medications: Torsemide 40 mg daily Carvedilol 6.25 mg BID Losartan 12.5 mg daily  Pertinent Lab Values: Serum creatinine 1.57, BUN 30, Potassium 4.4, Sodium 136, BNP 2895.8, Magnesium 2.3 A1c 6.1 (05/11/21) I/O: Net -24L since admission   Vital Signs: Weight: 256 lbs (admission weight: 275 lbs) Blood pressure: 110/80s  Heart rate: 80s   Medication Assistance / Insurance Benefits Check: Does the patient have prescription insurance?  Yes Type of insurance plan: [place] Medicaid  Outpatient Pharmacy:  Prior to admission outpatient pharmacy: [facility] Is the patient willing to use [facility] TOC pharmacy at discharge? Yes Is the patient willing to transition their outpatient pharmacy to utilize a [facility] outpatient  pharmacy?   Pending    Assessment: 1. Acute on chronic systolic CHF (EF <28%), due to mixed ischemic and nonischemic cardiomyopathy. NYHA class II symptoms. - Continue torsemide 40 mg BID - Continue carvedilol 3.125 mg BID  - Continue Entresto 24/26 mg BID - Continue spironolactone 25 mg daily - Continue Farxiga 10 mg daily   Plan: 1) Medication changes recommended at this time: - Continue current regimen  2) Patient assistance: - Patient has [place] Medicaid - all copays should be $0-4 per month and copays can be waived at [facility] pharmacies if he cannot afford his prescriptions - HF TOC appt scheduled for 8/22 - Appreciate assistance from HF CSW and RNCM. Further follow up of SDOH needs will be provided by outpatient CSW.  3)  Education  - Patient has been educated on current HF medications and potential additions to HF medication regimen - Patient verbalizes understanding that over the next few months, these medication doses may change and more medications may be added to optimize HF regimen - Patient has been educated on basic disease state pathophysiology and goals of therapy  Sharen Hones, PharmD, BCPS Heart Failure Stewardship Pharmacist Phone [phone]

## 2021-06-21 NOTE — Progress Notes (Signed)
This chaplain is present for F/U spiritual care.  The chaplain shared with the Pt. he has an appointment today with the notary at 1pm for notarizing of the Pt. Advance Directive.

## 2021-06-21 NOTE — Progress Notes (Signed)
AVS was printed and critical information was highlighted for PT's ease. Attempted to explain discharge instructions but PT refused as his ride was arriving in 5 minutes. AVS copy was given to PT. PT was transported by wheelchair downstairs to ride by NT.

## 2021-06-21 NOTE — Social Work (Addendum)
Pt requested to speak with CSW, pt wanted to update CSW about another facility through a pastor [place] [phone]). The price would be cheaper at $125 a week. CSW contacted Merian Capron to confirm information, he stated he would contact CWS back after confirming a bed availability but pricing is correct. CSW shared this information with CSW Cortlin with HF team. Cortlin will now follow pt until DC.  CSW attempted to contact pt per request from pt, no answer. Pt has [facility] and [facility] was [facility] for pt. CSW signing off.

## 2021-06-21 NOTE — Progress Notes (Signed)
The chaplain arrived with the notary at the same time the Pt. was discharging.   The Pt. was not able to notarize his Advance Directive. The Pt. AD was given to the Pt. for future notarization. The chaplain assisted the Pt. in gathering and bagging his belongings while he was waiting on the wheelchair.

## 2021-06-25 ENCOUNTER — Telehealth (HOSPITAL_COMMUNITY): Payer: Self-pay

## 2021-06-25 ENCOUNTER — Encounter (HOSPITAL_COMMUNITY): Payer: Medicaid Other

## 2021-06-25 NOTE — Telephone Encounter (Signed)
Attempted to call patient to remind of upcoming HV TOC appt today at 11AM. Pt has "Software engineer" which would not allow Navigator to leave message or speak with patient.   Ozella Rocks, MSN, RN Heart Failure Nurse Navigator [phone]

## 2021-06-28 ENCOUNTER — Encounter (HOSPITAL_COMMUNITY): Payer: Self-pay

## 2021-06-28 ENCOUNTER — Ambulatory Visit (HOSPITAL_COMMUNITY)
Admission: RE | Admit: 2021-06-28 | Discharge: 2021-06-28 | Disposition: A | Discharge status: Home / Self Care | Payer: Medicaid Other | Source: Ambulatory Visit | Attending: Internal Medicine | Admitting: Internal Medicine

## 2021-06-28 ENCOUNTER — Telehealth (HOSPITAL_COMMUNITY): Payer: Self-pay

## 2021-06-28 ENCOUNTER — Other Ambulatory Visit: Payer: Self-pay

## 2021-06-28 VITALS — BP 100/78 | HR 86 | Wt 252.2 lb

## 2021-06-28 DIAGNOSIS — I5042 Chronic combined systolic (congestive) and diastolic (congestive) heart failure: Secondary | ICD-10-CM | POA: Insufficient documentation

## 2021-06-28 DIAGNOSIS — F1910 Other psychoactive substance abuse, uncomplicated: Secondary | ICD-10-CM | POA: Diagnosis not present

## 2021-06-28 DIAGNOSIS — Z7984 Long term (current) use of oral hypoglycemic drugs: Secondary | ICD-10-CM | POA: Diagnosis not present

## 2021-06-28 DIAGNOSIS — I130 Hypertensive heart and chronic kidney disease with heart failure and stage 1 through stage 4 chronic kidney disease, or unspecified chronic kidney disease: Secondary | ICD-10-CM | POA: Insufficient documentation

## 2021-06-28 DIAGNOSIS — F1490 Cocaine use, unspecified, uncomplicated: Secondary | ICD-10-CM | POA: Diagnosis not present

## 2021-06-28 DIAGNOSIS — Z79899 Other long term (current) drug therapy: Secondary | ICD-10-CM | POA: Insufficient documentation

## 2021-06-28 DIAGNOSIS — Z7982 Long term (current) use of aspirin: Secondary | ICD-10-CM | POA: Diagnosis not present

## 2021-06-28 DIAGNOSIS — N183 Chronic kidney disease, stage 3 unspecified: Secondary | ICD-10-CM

## 2021-06-28 DIAGNOSIS — Z8249 Family history of ischemic heart disease and other diseases of the circulatory system: Secondary | ICD-10-CM | POA: Insufficient documentation

## 2021-06-28 DIAGNOSIS — N1832 Chronic kidney disease, stage 3b: Secondary | ICD-10-CM | POA: Diagnosis not present

## 2021-06-28 DIAGNOSIS — I2510 Atherosclerotic heart disease of native coronary artery without angina pectoris: Secondary | ICD-10-CM | POA: Insufficient documentation

## 2021-06-28 DIAGNOSIS — I5040 Unspecified combined systolic (congestive) and diastolic (congestive) heart failure: Secondary | ICD-10-CM | POA: Diagnosis not present

## 2021-06-28 DIAGNOSIS — F1010 Alcohol abuse, uncomplicated: Secondary | ICD-10-CM | POA: Diagnosis not present

## 2021-06-28 DIAGNOSIS — F1410 Cocaine abuse, uncomplicated: Secondary | ICD-10-CM | POA: Diagnosis not present

## 2021-06-28 LAB — BASIC METABOLIC PANEL
Anion gap: 9 (ref 5–15)
BUN: 27 mg/dL — ABNORMAL HIGH (ref 6–20)
CO2: 27 mmol/L (ref 22–32)
Calcium: 8.9 mg/dL (ref 8.9–10.3)
Chloride: 103 mmol/L (ref 98–111)
Creatinine, Ser: 1.96 mg/dL — ABNORMAL HIGH (ref 0.61–1.24)
GFR, Estimated: 39 mL/min — ABNORMAL LOW (ref >60)
Glucose, Bld: 98 mg/dL (ref 70–99)
Potassium: 4.1 mmol/L (ref 3.5–5.1)
Sodium: 139 mmol/L (ref 135–145)

## 2021-06-28 LAB — BRAIN NATRIURETIC PEPTIDE: B Natriuretic Peptide: 2258.6 pg/mL — ABNORMAL HIGH (ref 0.0–100.0)
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
## 2021-06-28 NOTE — Telephone Encounter (Signed)
Call attempted to confirm HV TOC appt today @ 11AM. HIPPA appropriate VM left with callback number.   Ozella Rocks, MSN, RN Heart Failure Nurse Navigator [phone]

## 2021-06-28 NOTE — Patient Instructions (Addendum)
Take extra dose of Torsemide this afternoon ONLY (Take 4 tabs (80 mg) instead of 2 tabs)  Labs done today, we will call you for abnormal results  Thank you for allowing [place] to provider your heart failure care after your recent hospitalization. Please follow-up with [place] in 2 weeks   Do the following things EVERYDAY: Weigh yourself in the morning before breakfast. Write it down and keep it in a log. Take your medicines as prescribed Eat low salt foods--Limit salt (sodium) to 2000 mg per day.  Stay as active as you can everyday Limit all fluids for the day to less than 2 liters  If you have any questions, issues, or concerns before your next appointment please call our office at [phone], opt. 2 and leave a message for the triage nurse.
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
## 2021-07-03 LAB — URINE DRUG PANEL 7
Amphetamines, Urine: NEGATIVE ng/mL
Barbiturate, Ur: NEGATIVE ng/mL
Benzodiazepine Quant, Ur: NEGATIVE ng/mL
Cannabinoid Quant, Ur: POSITIVE ng/mL — AB
Cocaine (Metab.): NEGATIVE ng/mL
Opiate Quant, Ur: NEGATIVE ng/mL
Phencyclidine, Ur: NEGATIVE ng/mL

## 2021-07-11 ENCOUNTER — Telehealth (HOSPITAL_COMMUNITY): Payer: Self-pay

## 2021-07-11 NOTE — Telephone Encounter (Signed)
Call attempted to confirm HV TOC appt 9/8 @ 11AM. HIPPA appropriate VM left with callback number.  Ozella Rocks, MSN, RN Heart Failure Nurse Navigator [phone]

## 2021-07-12 ENCOUNTER — Encounter (HOSPITAL_COMMUNITY): Payer: Self-pay

## 2021-07-12 ENCOUNTER — Ambulatory Visit (HOSPITAL_COMMUNITY)
Admission: RE | Admit: 2021-07-12 | Discharge: 2021-07-12 | Disposition: A | Discharge status: Home / Self Care | Payer: Medicaid Other | Source: Ambulatory Visit | Attending: Internal Medicine | Admitting: Internal Medicine

## 2021-07-12 ENCOUNTER — Other Ambulatory Visit: Payer: Self-pay

## 2021-07-12 VITALS — BP 106/79 | HR 67 | Wt 252.6 lb

## 2021-07-12 DIAGNOSIS — F1010 Alcohol abuse, uncomplicated: Secondary | ICD-10-CM | POA: Diagnosis not present

## 2021-07-12 DIAGNOSIS — N183 Chronic kidney disease, stage 3 unspecified: Secondary | ICD-10-CM

## 2021-07-12 DIAGNOSIS — I5042 Chronic combined systolic (congestive) and diastolic (congestive) heart failure: Secondary | ICD-10-CM | POA: Insufficient documentation

## 2021-07-12 DIAGNOSIS — F1410 Cocaine abuse, uncomplicated: Secondary | ICD-10-CM | POA: Diagnosis not present

## 2021-07-12 DIAGNOSIS — Z5941 Food insecurity: Secondary | ICD-10-CM | POA: Diagnosis not present

## 2021-07-12 DIAGNOSIS — I5040 Unspecified combined systolic (congestive) and diastolic (congestive) heart failure: Secondary | ICD-10-CM | POA: Diagnosis not present

## 2021-07-12 DIAGNOSIS — Z79899 Other long term (current) drug therapy: Secondary | ICD-10-CM | POA: Insufficient documentation

## 2021-07-12 DIAGNOSIS — F1021 Alcohol dependence, in remission: Secondary | ICD-10-CM

## 2021-07-12 DIAGNOSIS — Z7984 Long term (current) use of oral hypoglycemic drugs: Secondary | ICD-10-CM | POA: Diagnosis not present

## 2021-07-12 DIAGNOSIS — Z7982 Long term (current) use of aspirin: Secondary | ICD-10-CM | POA: Diagnosis not present

## 2021-07-12 DIAGNOSIS — N1832 Chronic kidney disease, stage 3b: Secondary | ICD-10-CM | POA: Diagnosis not present

## 2021-07-12 DIAGNOSIS — I130 Hypertensive heart and chronic kidney disease with heart failure and stage 1 through stage 4 chronic kidney disease, or unspecified chronic kidney disease: Secondary | ICD-10-CM | POA: Insufficient documentation

## 2021-07-12 DIAGNOSIS — I2510 Atherosclerotic heart disease of native coronary artery without angina pectoris: Secondary | ICD-10-CM | POA: Diagnosis not present

## 2021-07-12 DIAGNOSIS — Z596 Low income: Secondary | ICD-10-CM | POA: Diagnosis not present

## 2021-07-12 LAB — BASIC METABOLIC PANEL
Anion gap: 9 (ref 5–15)
BUN: 21 mg/dL — ABNORMAL HIGH (ref 6–20)
CO2: 25 mmol/L (ref 22–32)
Calcium: 9 mg/dL (ref 8.9–10.3)
Chloride: 102 mmol/L (ref 98–111)
Creatinine, Ser: 1.67 mg/dL — ABNORMAL HIGH (ref 0.61–1.24)
GFR, Estimated: 47 mL/min — ABNORMAL LOW (ref >60)
Glucose, Bld: 96 mg/dL (ref 70–99)
Potassium: 3.9 mmol/L (ref 3.5–5.1)
Sodium: 136 mmol/L (ref 135–145)

## 2021-07-12 LAB — BRAIN NATRIURETIC PEPTIDE: B Natriuretic Peptide: 3229.8 pg/mL — ABNORMAL HIGH (ref 0.0–100.0)

## 2021-07-12 NOTE — Progress Notes (Signed)
[facility]  PCP: Pending Primary Cardiologist: Kristeen Miss   HPI:   Dale [name] is a 60 y.o.  male  with a PMH significant for hypertension, polysubstance use including cocaine, alcohol, chronic combined CHF, CAD,   Initially admitted 09/27/2018 with signs/symptoms of CHF in the setting of poorly controlled hypertension alcohol and cocaine use.  BNP was 1500 started on IV Lasix had an echo with EF 20%, G2 DD.  He left AMA.   Readmitted 1 month later, started on IV diuretics, beta-blocker, ARB and BiDil.  Discharged on oral torsemide.  He remained positive for cocaine on UDS   02/28/2019 presented with signs and symptoms of heart failure and chest pain diagnosed with NSTEMI troponin peaked at 8 and placed on heparin.  He declined cardiac catheterization.     Admitted again 03/2019 similar admission still using cocaine but no evidence of ischemia.  Diuresed and discharged. 03/11/2019 echo EF 20 to 25%, elevated LVEDP, diffuse hypokinesis with worse hypokinesis and inferior wall moderately enlarged RV with normal function.   05/2019 admitted with acute CHF and underwent left/right heart cath:   1.         Severe single-vessel coronary artery disease with chronic total occlusion of the proximal RCA. 2.         Mild to moderate, non-obstructive coronary artery disease involving the LAD and LCx. 3.         Moderately elevated left heart filling pressures. 4.         Severely elevated right heart filling pressures. 5.         Mild pulmonary hypertension. 6.         Normal Fick cardiac output/index.   Treated medically    2 CHF admissions 2021. 6 admissions so far in 2022.   06/01/21 NSTEMI bu treated medically due to continued cocaine use and concern for poor medication adherence if stent was needed   Most recent admission 06/2021 acute on chronic combined CHF.  ECHO with EF <10%, Medications restarted.  Prior meds losartan and carvedilol.   Farxiga and spiro added.  Wt trend 276>256lbs  Here for FU.  Last visit doing better, still some extra volume temporary short increase in torsemide.  No cocaine or alcohol use.  Continued improvement in exercise capacity.  Walking everywhere now.  Walking 2-4 city blocks at a time without issue and getting winded.  Denies any chest pain or shortness of breath.  Taking all meds as prescribed.    ROS: All systems negative except as listed in HPI, PMH and Problem List.  SH:  Social History   Socioeconomic History   Marital status: Single    Spouse name: none   Number of children: Not on file   Years of education: Not on file   Highest education level: Not on file  Occupational History   Occupation: Disabled  Tobacco Use   Smoking status: Never   Smokeless tobacco: Never  Vaping Use   Vaping Use: Never used  Substance and Sexual Activity   Alcohol use: Yes    Comment: 2 quarts a week    Drug use: Yes    Frequency: 3.0 times per week    Types: Marijuana, Cocaine   Sexual activity: Not Currently  Other Topics Concern   Not on file  Social History Narrative   Lives w/ mother in [place] but recently visiting w/ dtr in [place].   Social Determinants of Health  Financial Resource Strain: High Risk   Difficulty of Paying Living Expenses: Hard  Food Insecurity: Geophysicist/field seismologist Present   Worried About Programme researcher, broadcasting/film/video in the Last Year: Sometimes true   Barista in the Last Year: Sometimes true  Transportation Needs: Personal assistant (Medical): Yes   Lack of Transportation (Non-Medical): Yes  Physical Activity: Not on file  Stress: Not on file  Social Connections: Not on file  Intimate Partner Violence: Not on file    FH:  Family History  Problem Relation Age of Onset   Hypertension Maternal Grandmother     Past Medical History:  Diagnosis Date   Alcohol abuse    CAD (coronary artery disease)    a. 05/2019 Cath: LM nl, LAD 15p,  56m, LCX large/nl, OM2 30, OM3 20, RCA 100 CTO w/ bridging L->R collats to RPDA.   Chronic combined systolic and diastolic CHF (congestive heart failure) (HCC)    a. 09/27/18 Echo: EF 20-25%, grade 2 DD; b. 03/2019 Echo: EF 20-25%; c. 05/2021 Echo: EF <10%, glob HK. GrII DD. Sev red RV fxn. Sev BAE. Mild MR. Mild-mod TR.   CKD (chronic kidney disease), stage II    Cocaine abuse (HCC)    Hyperlipidemia LDL goal <70    Hypertension    Ischemic cardiomyopathy    a. 09/27/18 Echo: EF 20-25%, grade 2 DD; b. 03/2019 Echo: EF 20-25%; c. 05/2019 Cath: RCA 100 CTA, otw nonobs dzs; d. 05/2021 Echo: EF <10%, glob HK. GrII DD. Sev red RV fxn.   Morbid obesity (HCC)    Noncompliance    Normocytic anemia     Current Outpatient Medications  Medication Sig Dispense Refill   acetaminophen (TYLENOL) 325 MG tablet Take 2 tablets (650 mg total) by mouth every 4 (four) hours as needed for headache or mild pain.     aspirin EC 81 MG EC tablet Take 1 tablet (81 mg total) by mouth daily. Swallow whole. 30 tablet 11   atorvastatin (LIPITOR) 40 MG tablet Take 1 tablet (40 mg total) by mouth daily. 30 tablet 0   carvedilol (COREG) 3.125 MG tablet Take 1 tablet (3.125 mg total) by mouth 2 (two) times daily with a meal. 60 tablet 0   dapagliflozin propanediol (FARXIGA) 10 MG TABS tablet Take 1 tablet (10 mg total) by mouth daily. 30 tablet 0   diclofenac sodium (VOLTAREN) 1 % GEL Apply 2 g topically 4 (four) times daily as needed (pain).     hydrOXYzine (ATARAX/VISTARIL) 25 MG tablet Take 1 tablet (25 mg total) by mouth 2 (two) times daily as needed for anxiety and take 2 tablets (50 mg total) by mouth at bedtime as needed (insomnia). 90 tablet 0   sacubitril-valsartan (ENTRESTO) 24-26 MG Take 1 tablet by mouth 2 (two) times daily. 60 tablet 0   spironolactone (ALDACTONE) 25 MG tablet Take 1 tablet (25 mg total) by mouth daily. 30 tablet 0   torsemide (DEMADEX) 20 MG tablet Take 2 tablets (40 mg total) by mouth 2 (two) times  daily. 120 tablet 0   No current facility-administered medications for this encounter.    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  EF 20%, G2 DD.  He left AMA. -05/2019 LHC CTO of RCA treated medically -05/2021 NSTEMI treated medically due to active cocaine use and concern for DAPT compliance if stent needed.  No LHC -NYHA  Improved class II-III symptoms, Trace edema volume status looks better -Continue Entresto 24/26 BID, carvedilol 3.125 BID, Spiro 25, Farxiga 10, torsemide 40 BID -no bp room to titrate therapy further currently  CAD -no s/s of chest pain -continue asa, statin, GDMT above -will need eventual repeat LHC   Cocaine Use Disorder -Has not used cocaine in 27 days.  Seems serious about abstinence after being left for dead after overdose (last time he used cocaine) -UDS last visit neg for cocaine and has not used any since last visit  Alcohol Use Disorder -Has cut back alcohol completely as we discussed  CKD 3b -repeat bmp  Social work continuing to follow  Patient has made great progress, he is a good candidate for AHF and will follow with them

## 2021-07-12 NOTE — Patient Instructions (Signed)
Labs done today. We will contact you only if your labs are abnormal.  No medication changes were made. Please continue all current medications as prescribed.  Your physician recommends that you schedule a follow-up appointment in: 1 month  If you have any questions or concerns before your next appointment please send [place] a message through [place] or call our office at [phone].    TO LEAVE A MESSAGE FOR THE NURSE SELECT OPTION 2, PLEASE LEAVE A MESSAGE INCLUDING: YOUR NAME DATE OF BIRTH CALL BACK NUMBER REASON FOR CALL**this is important as we prioritize the call backs  YOU WILL RECEIVE A CALL BACK THE SAME DAY AS LONG AS YOU CALL BEFORE 4:00 PM   Do the following things EVERYDAY: Weigh yourself in the morning before breakfast. Write it down and keep it in a log. Take your medicines as prescribed Eat low salt foods--Limit salt (sodium) to 2000 mg per day.  Stay as active as you can everyday Limit all fluids for the day to less than 2 liters   At the [facility], you and your health needs are our priority. As part of our continuing mission to provide you with exceptional heart care, we have created designated Provider Care Teams. These Care Teams include your primary Cardiologist (physician) and Advanced Practice Providers (APPs- Physician Assistants and Nurse Practitioners) who all work together to provide you with the care you need, when you need it.   You may see any of the following providers on your designated Care Team at your next follow up: Dr Arvilla Meres Dr Carron Curie, NP Robbie Lis, [place] Karle Plumber, PharmD   Please be sure to bring in all your medications bottles to every appointment.

## 2021-07-13 DIAGNOSIS — F1021 Alcohol dependence, in remission: Secondary | ICD-10-CM | POA: Insufficient documentation

## 2021-07-13 NOTE — Progress Notes (Signed)
CSW met with patient in the clinic who shared he continues to remain drug free. Patient states he is still living at the motel but has found new avenues to occupy himself during the day and avoid his old way of life. He is determined to stay drug free and maintain a new healthy lifestyle. He states he is taking his medications appropriately and trying to eat right and maintain adherence to the plan of care. Patient was well groomed, feels good and motivated for improved quality of life. CSW provided supportive intervention and encouragement for continued healthy lifestyle. CSW available as needed. Raquel Sarna, [place], [place]

## 2021-07-13 NOTE — Addendum Note (Signed)
Encounter addended by: Marcy Siren, LCSW on: 07/13/2021 10:28 AM  Actions taken: Clinical Note Signed

## 2021-07-16 ENCOUNTER — Telehealth: Payer: Self-pay | Admitting: Cardiovascular Disease

## 2021-07-16 NOTE — Telephone Encounter (Signed)
Calling to confirm that we received the fax that needs to be filled out by the dr. Elease Hashimoto. It certificate of necessity form. Fax # [phone]. Please advise

## 2021-07-18 NOTE — Telephone Encounter (Signed)
Fax has not been received. Attempted to call Francis Dowse back. Number is incorrect.

## 2021-07-23 NOTE — Telephone Encounter (Signed)
Rep stated that they will fax information over again today.

## 2021-07-26 NOTE — Telephone Encounter (Signed)
Fax received from [facility] for rollator walker with seat. Per Dr. Elease Hashimoto, the patient is being followed by [facility]. Routing message to [facility] and will fax orders to them for review.

## 2021-08-15 ENCOUNTER — Other Ambulatory Visit (HOSPITAL_COMMUNITY): Payer: Self-pay | Admitting: *Deleted

## 2021-08-15 ENCOUNTER — Encounter (HOSPITAL_COMMUNITY): Payer: Medicaid Other

## 2021-08-15 ENCOUNTER — Other Ambulatory Visit (HOSPITAL_COMMUNITY): Payer: Self-pay

## 2021-08-15 MED ORDER — DAPAGLIFLOZIN PROPANEDIOL 10 MG PO TABS
10.0000 mg | ORAL_TABLET | Freq: Every day | ORAL | 3 refills | Status: DC
  Filled 2021-08-15 – 2021-08-16 (×2): qty 30, 30d supply, fill #0
  Filled 2021-10-09 – 2021-11-05 (×2): qty 30, 30d supply, fill #1

## 2021-08-15 MED ORDER — CARVEDILOL 3.125 MG PO TABS
3.1250 mg | ORAL_TABLET | Freq: Two times a day (BID) | ORAL | 3 refills | Status: DC
  Filled 2021-08-15 (×2): qty 60, 30d supply, fill #0
  Filled 2021-10-09 – 2021-11-05 (×2): qty 60, 30d supply, fill #1

## 2021-08-15 MED ORDER — ATORVASTATIN CALCIUM 40 MG PO TABS
40.0000 mg | ORAL_TABLET | Freq: Every day | ORAL | 3 refills | Status: DC
  Filled 2021-08-15 (×2): qty 30, 30d supply, fill #0
  Filled 2021-10-09 – 2021-11-05 (×2): qty 30, 30d supply, fill #1

## 2021-08-15 MED ORDER — TORSEMIDE 20 MG PO TABS
40.0000 mg | ORAL_TABLET | Freq: Two times a day (BID) | ORAL | 3 refills | Status: DC
Start: 2021-08-15 — End: 2021-12-11
  Filled 2021-08-15: qty 120, 30d supply, fill #0
  Filled 2021-10-09 – 2021-11-05 (×2): qty 120, 30d supply, fill #1

## 2021-08-15 MED ORDER — SPIRONOLACTONE 25 MG PO TABS
25.0000 mg | ORAL_TABLET | Freq: Every day | ORAL | 3 refills | Status: DC
  Filled 2021-08-15: qty 30, 30d supply, fill #0
  Filled 2021-10-09 – 2021-11-05 (×2): qty 30, 30d supply, fill #1

## 2021-08-15 MED ORDER — SACUBITRIL-VALSARTAN 24-26 MG PO TABS
1.0000 | ORAL_TABLET | Freq: Two times a day (BID) | ORAL | 0 refills | Status: DC
  Filled 2021-08-15 – 2021-08-16 (×3): qty 60, 30d supply, fill #0

## 2021-08-16 ENCOUNTER — Other Ambulatory Visit (HOSPITAL_COMMUNITY): Payer: Self-pay

## 2021-08-16 ENCOUNTER — Telehealth (HOSPITAL_COMMUNITY): Payer: Self-pay | Admitting: Pharmacy Technician

## 2021-08-16 NOTE — Telephone Encounter (Signed)
Advanced Heart Failure Patient Advocate Encounter  Prior Authorization for [name] has been submitted and approved.    PA# [id number] Effective dates: 08/16/21 through 08/16/22   Received notification from [facility] that prior authorization for [name] is required.   PA submitted on [place] [id number] W Status is pending   Will continue to follow.

## 2021-08-20 ENCOUNTER — Telehealth: Payer: Self-pay | Admitting: Licensed Clinical Social Worker

## 2021-08-20 ENCOUNTER — Telehealth: Payer: Self-pay | Admitting: Cardiovascular Disease

## 2021-08-20 ENCOUNTER — Other Ambulatory Visit (HOSPITAL_COMMUNITY): Payer: Self-pay

## 2021-08-20 NOTE — Telephone Encounter (Signed)
Pt c/o medication issue:  1. Name of Medication: carvedilol (COREG) 3.125 MG tablet spironolactone (ALDACTONE) 25 MG tablet atorvastatin (LIPITOR) 40 MG tablet sacubitril-valsartan (ENTRESTO) 24-26 MG dapagliflozin propanediol (FARXIGA) 10 MG TABS tablet torsemide (DEMADEX) 20 MG tablet  2. How are you currently taking this medication (dosage and times per day)?  carvedilol (COREG) 3.125 MG tablet Take 1 tablet (3.125 mg total) by mouth 2 (two) times daily with a meal.   spironolactone (ALDACTONE) 25 MG tablet Take 1 tablet (25 mg total) by mouth daily   atorvastatin (LIPITOR) 40 MG tablet Take 1 tablet (40 mg total) by mouth daily.   sacubitril-valsartan (ENTRESTO) 24-26 MG Take 1 tablet by mouth 2 (two) times daily.   dapagliflozin propanediol (FARXIGA) 10 MG TABS tablet Take 1 tablet (10 mg total) by mouth daily.   torsemide (DEMADEX) 20 MG tablet Take 2 tablets (40 mg total) by mouth 2 (two) times daily.    3. Are you having a reaction (difficulty breathing--STAT)? no  4. What is your medication issue? Pt is at the outpatient pharmacy, he states that he is being charged a total of $24 after being told by one of the doctors that saw him in the hospital that he would be able to get these rx's for free... pt is not sure of who the doctor was that told him this.. please advise.

## 2021-08-20 NOTE — Telephone Encounter (Signed)
Daleen Bo RN called [name] social worker to assist. Annice Pih will call and assist at the [facility] out patient pharmacy.  Called and spoke to the staff at the pharmacy to advise the patient to stay and wait for his medication.

## 2021-08-20 NOTE — Telephone Encounter (Signed)
Advanced Heart Failure Patient Advocate Encounter  Prior Authorization for [name] has been approved.    PA# [id number] Effective dates: 08/16/21 through 08/16/22  Archer Asa, CPhT

## 2021-08-20 NOTE — Telephone Encounter (Signed)
CSW received call stating that patient needs assistance with medication. CSW will assist through the patient Care fund. [name], LCSW, CCSW-MCS [phone]

## 2021-09-03 ENCOUNTER — Encounter (HOSPITAL_COMMUNITY): Payer: Medicaid Other

## 2021-09-05 ENCOUNTER — Ambulatory Visit: Payer: Medicaid Other | Admitting: Cardiovascular Disease

## 2021-10-09 ENCOUNTER — Other Ambulatory Visit (HOSPITAL_COMMUNITY): Payer: Self-pay | Admitting: Cardiology

## 2021-10-09 ENCOUNTER — Other Ambulatory Visit (HOSPITAL_COMMUNITY): Payer: Self-pay

## 2021-10-09 MED ORDER — ENTRESTO 24-26 MG PO TABS
1.0000 | ORAL_TABLET | Freq: Two times a day (BID) | ORAL | 3 refills | Status: DC
  Filled 2021-10-09 – 2021-11-05 (×2): qty 60, 30d supply, fill #0

## 2021-10-17 ENCOUNTER — Other Ambulatory Visit (HOSPITAL_COMMUNITY): Payer: Self-pay

## 2021-11-05 ENCOUNTER — Other Ambulatory Visit (HOSPITAL_COMMUNITY): Payer: Self-pay

## 2021-12-06 ENCOUNTER — Telehealth (HOSPITAL_COMMUNITY): Payer: Self-pay

## 2021-12-06 NOTE — Telephone Encounter (Signed)
Called and left a message with patient's mother to confirm/remind patient of their appointment at the [facility] on 12/07/21.

## 2021-12-07 ENCOUNTER — Telehealth (HOSPITAL_COMMUNITY): Payer: Self-pay | Admitting: Family Medicine

## 2021-12-07 ENCOUNTER — Telehealth (HOSPITAL_COMMUNITY): Payer: Self-pay | Admitting: Licensed Clinical Social Worker

## 2021-12-07 ENCOUNTER — Encounter (HOSPITAL_COMMUNITY): Payer: Medicaid Other

## 2021-12-07 NOTE — Telephone Encounter (Signed)
Pt called @3 :45pm, stated transport never arrived, I r/s appt for 12/11/21 @8 :30am,  pt contact # [phone] Room 136, [facility].

## 2021-12-07 NOTE — Telephone Encounter (Signed)
Pt called clinic to inform that he needed pick up address for [facility] changed.  It was already the pick up time so first ride was unable to be canceled and went as a no show.  Pt provided CSW with new address to pick him up at but couldn't tell me the name of the location.  CSW created new ride to get to clinic and informed him of ride details.  CSW then received notification that ride was canceled due to pt no show- pt does not have a cellphone number so CSW was unable to follow up with pt regarding canceled ride and to reschedule.  Burna Sis, LCSW Clinical Social Worker [facility] Desk#: [phone] Cell#: [phone]

## 2021-12-07 NOTE — Progress Notes (Incomplete)
[facility] CLIN[facility] CONSULT NOTE  Referring Physician: Primary Care: Primary Cardiologist:  HPI: Dale [name] is a 61 y.o.  male  with a PMH significant for hypertension, polysubstance use including cocaine, alcohol, chronic combined CHF, CAD,   Initially admitted 09/27/2018 with signs/symptoms of CHF in the setting of poorly controlled hypertension alcohol and cocaine use.  BNP was 1500 started on IV Lasix had an echo with EF 20%, G2 DD.  He left AMA.   Readmitted 1 month later, started on IV diuretics, beta-blocker, ARB and BiDil.  Discharged on oral torsemide.  He remained positive for cocaine on UDS   02/28/2019 presented with signs and symptoms of heart failure and chest pain diagnosed with NSTEMI troponin peaked at 8 and placed on heparin.  He declined cardiac catheterization.     Admitted again 03/2019 similar admission still using cocaine but no evidence of ischemia.  Diuresed and discharged. 03/11/2019 echo EF 20 to 25%, elevated LVEDP, diffuse hypokinesis with worse hypokinesis and inferior wall moderately enlarged RV with normal function.   05/2019 admitted with acute CHF and underwent left/right heart cath:   1.         Severe single-vessel coronary artery disease with chronic total occlusion of the proximal RCA. 2.         Mild to moderate, non-obstructive coronary artery disease involving the LAD and LCx. 3.         Moderately elevated left heart filling pressures. 4.         Severely elevated right heart filling pressures. 5.         Mild pulmonary hypertension. 6.         Normal Fick cardiac output/index.   Treated medically    2 CHF admissions 2021. 6 admissions so far in 2022.    06/01/21 NSTEMI bu treated medically due to continued cocaine use and concern for poor medication adherence if stent was needed   Most recent admission 06/2021 acute on chronic combined CHF.  ECHO with EF <10%, Medications restarted.  Prior meds losartan and carvedilol.  Farxiga and  spiro added.  Wt trend 276>256lbs   Here for FU.  Last visit doing better, still some extra volume temporary short increase in torsemide.  No cocaine or alcohol use.  Continued improvement in exercise capacity.  Walking everywhere now.  Walking 2-4 city blocks at a time without issue and getting winded.  Denies any chest pain or shortness of breath.  Taking all meds as prescribed.        Review of Systems: [y] = yes, [ ]  = no   General: Weight gain [ ] ; Weight loss [ ] ; Anorexia [ ] ; Fatigue [ ] ; Fever [ ] ; Chills [ ] ; Weakness [ ]   Cardiac: Chest pain/pressure [ ] ; Resting SOB [ ] ; Exertional SOB [ ] ; Orthopnea [ ] ; Pedal Edema [ ] ; Palpitations [ ] ; Syncope [ ] ; Presyncope [ ] ; Paroxysmal nocturnal dyspnea[ ]   Pulmonary: Cough [ ] ; Wheezing[ ] ; Hemoptysis[ ] ; Sputum [ ] ; Snoring [ ]   GI: Vomiting[ ] ; Dysphagia[ ] ; Melena[ ] ; Hematochezia [ ] ; Heartburn[ ] ; Abdominal pain [ ] ; Constipation [ ] ; Diarrhea [ ] ; BRBPR [ ]   GU: Hematuria[ ] ; Dysuria [ ] ; Nocturia[ ]   Vascular: Pain in legs with walking [ ] ; Pain in feet with lying flat [ ] ; Non-healing sores [ ] ; Stroke [ ] ; TIA [ ] ; Slurred speech [ ] ;  Neuro: Headaches[ ] ; Vertigo[ ] ; Seizures[ ] ; Paresthesias[ ] ;Blurred vision [ ] ;  Diplopia [ ] ; Vision changes [ ]   Ortho/Skin: Arthritis [ ] ; Joint pain [ ] ; Muscle pain [ ] ; Joint swelling [ ] ; Back Pain [ ] ; Rash [ ]   Psych: Depression[ ] ; Anxiety[ ]   Heme: Bleeding problems [ ] ; Clotting disorders [ ] ; Anemia [ ]   Endocrine: Diabetes [ ] ; Thyroid dysfunction[ ]    Past Medical History:  Diagnosis Date   Alcohol abuse    CAD (coronary artery disease)    a. 05/2019 Cath: LM nl, LAD 15p, 53m, LCX large/nl, OM2 30, OM3 20, RCA 100 CTO w/ bridging L->R collats to RPDA.   Chronic combined systolic and diastolic CHF (congestive heart failure) ([place])    a. 09/27/18 Echo: EF 20-25%, grade 2 DD; b. 03/2019 Echo: EF 20-25%; c. 05/2021 Echo: EF <10%, glob HK. GrII DD. Sev red RV fxn. Sev BAE. Mild MR.  Mild-mod TR.   CKD (chronic kidney disease), stage II    Cocaine abuse ([place])    Hyperlipidemia LDL goal <70    Hypertension    Ischemic cardiomyopathy    a. 09/27/18 Echo: EF 20-25%, grade 2 DD; b. 03/2019 Echo: EF 20-25%; c. 05/2019 Cath: RCA 100 CTA, otw nonobs dzs; d. 05/2021 Echo: EF <10%, glob HK. GrII DD. Sev red RV fxn.   Morbid obesity ([place])    Noncompliance    Normocytic anemia     Current Outpatient Medications  Medication Sig Dispense Refill   acetaminophen (TYLENOL) 325 MG tablet Take 2 tablets (650 mg total) by mouth every 4 (four) hours as needed for headache or mild pain.     aspirin EC 81 MG EC tablet Take 1 tablet (81 mg total) by mouth daily. Swallow whole. 30 tablet 11   atorvastatin (LIPITOR) 40 MG tablet Take 1 tablet (40 mg total) by mouth daily. 30 tablet 3   carvedilol (COREG) 3.125 MG tablet Take 1 tablet (3.125 mg total) by mouth 2 (two) times daily with a meal. 60 tablet 3   dapagliflozin propanediol (FARXIGA) 10 MG TABS tablet Take 1 tablet (10 mg total) by mouth daily. 30 tablet 3   diclofenac sodium (VOLTAREN) 1 % GEL Apply 2 g topically 4 (four) times daily as needed (pain).     hydrOXYzine (ATARAX/VISTARIL) 25 MG tablet Take 1 tablet (25 mg total) by mouth 2 (two) times daily as needed for anxiety and take 2 tablets (50 mg total) by mouth at bedtime as needed (insomnia). 90 tablet 0   sacubitril-valsartan (ENTRESTO) 24-26 MG Take 1 tablet by mouth 2 (two) times daily. 60 tablet 3   spironolactone (ALDACTONE) 25 MG tablet Take 1 tablet (25 mg total) by mouth daily. 30 tablet 3   torsemide (DEMADEX) 20 MG tablet Take 2 tablets (40 mg total) by mouth 2 (two) times daily. 120 tablet 3   No current facility-administered medications for this visit.    Allergies  Allergen Reactions   Hydrocodone Itching      Social History   Socioeconomic History   Marital status: Single    Spouse name: none   Number of children: Not on file   Years of education: Not on  file   Highest education level: Not on file  Occupational History   Occupation: Disabled  Tobacco Use   Smoking status: Never   Smokeless tobacco: Never  Vaping Use   Vaping Use: Never used  Substance and Sexual Activity   Alcohol use: Yes    Comment: 2 quarts a week    Drug use: Yes  Frequency: 3.0 times per week    Types: Marijuana, Cocaine   Sexual activity: Not Currently  Other Topics Concern   Not on file  Social History Narrative   Lives w/ mother in [place] but recently visiting w/ dtr in [place].   Social Determinants of Health   Financial Resource Strain: High Risk   Difficulty of Paying Living Expenses: Hard  Food Insecurity: Food Insecurity Present   Worried About Charity fundraiser in the Last Year: Sometimes true   Arboriculturist in the Last Year: Sometimes true  Transportation Needs: Unmet Transportation Needs   Lack of Transportation (Medical): Yes   Lack of Transportation (Non-Medical): Yes  Physical Activity: Not on file  Stress: Not on file  Social Connections: Not on file  Intimate Partner Violence: Not on file      Family History  Problem Relation Age of Onset   Hypertension Maternal Grandmother     There were no vitals filed for this visit.  PHYSICAL EXAM: General:  Well appearing. No respiratory difficulty HEENT: normal Neck: supple. no JVD. Carotids 2+ bilat; no bruits. No lymphadenopathy or thryomegaly appreciated. Cor: PMI nondisplaced. Regular rate & rhythm. No rubs, gallops or murmurs. Lungs: clear Abdomen: soft, nontender, nondistended. No hepatosplenomegaly. No bruits or masses. Good bowel sounds. Extremities: no cyanosis, clubbing, rash, edema Neuro: alert & oriented x 3, cranial nerves grossly intact. moves all 4 extremities w/o difficulty. Affect pleasant.  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  EF 20%, G2 DD.  He left AMA. -05/2019 LHC CTO of RCA treated medically -05/2021 NSTEMI treated medically due to active cocaine use and concern for DAPT compliance if stent needed.  No LHC -NYHA  Improved class II-III symptoms, Trace edema volume status looks better -Continue Entresto 24/26 BID, carvedilol 3.125 BID, Spiro 25, Farxiga 10, torsemide 40 BID -no bp room to titrate therapy further currently   CAD -no s/s of chest pain -continue asa, statin, GDMT above -will need eventual repeat LHC    Cocaine Use Disorder -Has not used cocaine in 27 days.  Seems serious about abstinence after being left for dead after overdose (last time he used cocaine) -UDS last visit neg for cocaine and has not used any since last visit   Alcohol Use Disorder -Has cut back alcohol completely as we discussed   CKD 3b -repeat bmp   Social work continuing to follow   Patient has made great progress, he is a good candidate for AHF and will follow with them

## 2021-12-10 ENCOUNTER — Telehealth (HOSPITAL_COMMUNITY): Payer: Self-pay

## 2021-12-10 NOTE — Telephone Encounter (Signed)
Called and left a voice message to confirm/remind patient of their appointment at the [facility] on 12/11/21.

## 2021-12-11 ENCOUNTER — Other Ambulatory Visit (HOSPITAL_COMMUNITY): Payer: Self-pay

## 2021-12-11 ENCOUNTER — Encounter (HOSPITAL_COMMUNITY): Payer: Self-pay

## 2021-12-11 ENCOUNTER — Other Ambulatory Visit: Payer: Self-pay

## 2021-12-11 ENCOUNTER — Ambulatory Visit (HOSPITAL_COMMUNITY)
Admission: RE | Admit: 2021-12-11 | Discharge: 2021-12-11 | Disposition: A | Discharge status: Home / Self Care | Payer: Medicaid Other | Source: Ambulatory Visit | Attending: Family Medicine | Admitting: Family Medicine

## 2021-12-11 VITALS — BP 118/98 | HR 96 | Wt 231.6 lb

## 2021-12-11 DIAGNOSIS — I5042 Chronic combined systolic (congestive) and diastolic (congestive) heart failure: Secondary | ICD-10-CM | POA: Diagnosis present

## 2021-12-11 DIAGNOSIS — Z79899 Other long term (current) drug therapy: Secondary | ICD-10-CM | POA: Insufficient documentation

## 2021-12-11 DIAGNOSIS — F1410 Cocaine abuse, uncomplicated: Secondary | ICD-10-CM

## 2021-12-11 DIAGNOSIS — I509 Heart failure, unspecified: Secondary | ICD-10-CM | POA: Diagnosis not present

## 2021-12-11 DIAGNOSIS — I5040 Unspecified combined systolic (congestive) and diastolic (congestive) heart failure: Secondary | ICD-10-CM

## 2021-12-11 DIAGNOSIS — F1090 Alcohol use, unspecified, uncomplicated: Secondary | ICD-10-CM | POA: Insufficient documentation

## 2021-12-11 DIAGNOSIS — I130 Hypertensive heart and chronic kidney disease with heart failure and stage 1 through stage 4 chronic kidney disease, or unspecified chronic kidney disease: Secondary | ICD-10-CM | POA: Diagnosis not present

## 2021-12-11 DIAGNOSIS — Z5901 Sheltered homelessness: Secondary | ICD-10-CM | POA: Insufficient documentation

## 2021-12-11 DIAGNOSIS — I2582 Chronic total occlusion of coronary artery: Secondary | ICD-10-CM | POA: Diagnosis not present

## 2021-12-11 DIAGNOSIS — I272 Pulmonary hypertension, unspecified: Secondary | ICD-10-CM | POA: Insufficient documentation

## 2021-12-11 DIAGNOSIS — I493 Ventricular premature depolarization: Secondary | ICD-10-CM | POA: Insufficient documentation

## 2021-12-11 DIAGNOSIS — Z7982 Long term (current) use of aspirin: Secondary | ICD-10-CM | POA: Insufficient documentation

## 2021-12-11 DIAGNOSIS — N1832 Chronic kidney disease, stage 3b: Secondary | ICD-10-CM | POA: Insufficient documentation

## 2021-12-11 DIAGNOSIS — N183 Chronic kidney disease, stage 3 unspecified: Secondary | ICD-10-CM

## 2021-12-11 DIAGNOSIS — Z7984 Long term (current) use of oral hypoglycemic drugs: Secondary | ICD-10-CM | POA: Insufficient documentation

## 2021-12-11 DIAGNOSIS — F1021 Alcohol dependence, in remission: Secondary | ICD-10-CM

## 2021-12-11 DIAGNOSIS — I2510 Atherosclerotic heart disease of native coronary artery without angina pectoris: Secondary | ICD-10-CM | POA: Diagnosis not present

## 2021-12-11 DIAGNOSIS — Z139 Encounter for screening, unspecified: Secondary | ICD-10-CM

## 2021-12-11 DIAGNOSIS — I252 Old myocardial infarction: Secondary | ICD-10-CM | POA: Insufficient documentation

## 2021-12-11 DIAGNOSIS — I5022 Chronic systolic (congestive) heart failure: Secondary | ICD-10-CM

## 2021-12-11 LAB — BASIC METABOLIC PANEL WITH GFR
Anion gap: 7 (ref 5–15)
BUN: 20 mg/dL (ref 6–20)
CO2: 23 mmol/L (ref 22–32)
Calcium: 8.4 mg/dL — ABNORMAL LOW (ref 8.9–10.3)
Chloride: 108 mmol/L (ref 98–111)
Creatinine, Ser: 1.18 mg/dL (ref 0.61–1.24)
GFR, Estimated: >60 mL/min
Glucose, Bld: 96 mg/dL (ref 70–99)
Potassium: 3.9 mmol/L (ref 3.5–5.1)
Sodium: 138 mmol/L (ref 135–145)

## 2021-12-11 LAB — BRAIN NATRIURETIC PEPTIDE: B Natriuretic Peptide: 2931.4 pg/mL — ABNORMAL HIGH (ref 0.0–100.0)

## 2021-12-11 MED ORDER — ENTRESTO 49-51 MG PO TABS
1.0000 | ORAL_TABLET | Freq: Two times a day (BID) | ORAL | 6 refills | Status: DC
  Filled 2021-12-11: qty 60, 30d supply, fill #0

## 2021-12-11 MED ORDER — TORSEMIDE 20 MG PO TABS
60.0000 mg | ORAL_TABLET | Freq: Two times a day (BID) | ORAL | 3 refills | Status: DC
Start: 2021-12-11 — End: 2022-02-22
  Filled 2021-12-11: qty 180, 30d supply, fill #0

## 2021-12-11 NOTE — Addendum Note (Signed)
Encounter addended by: Jorge Ny, LCSW on: 12/11/2021 11:36 AM  Actions taken: Flowsheet accepted, Clinical Note Signed

## 2021-12-11 NOTE — Progress Notes (Addendum)
ADVANCED HF CLINIC CONSULT NOTE  Referring Physician: Dr. Vickki Muff Primary Care: [facility] HF Cardiologist: Dr. Aundra Dubin  HPI: Dale [name] is a 61 y.o. male  with a PMH significant for hypertension, polysubstance use (including cocaine & alcohol), chronic combined CHF, and CAD.   He was admitted 11/19 with signs/symptoms of CHF, in the setting of poorly controlled hypertension alcohol and cocaine use. He was started on IV Lasix and echo showed EF 20%, G2 DD.  He left AMA.   Readmitted 12/19 with CHF exacerbation, started on IV diuretics, beta-blocker, ARB and BiDil.  Discharged on oral torsemide.  He remained positive for cocaine on UDS.   Admitted 4/20 with chest pain and CHF. Diagnosed with NSTEMI. He declined cardiac catheterization.     Admitted 5/20 with CHF and chest pain, still using cocaine, but no evidence of ischemia.  Diuresed and discharged.  Echo (5/20) EF 20 to 25%, elevated LVEDP, diffuse hypokinesis with worse hypokinesis and inferior wall moderately enlarged RV with normal function.  Admitted (7/20) with acute CHF and underwent L/RHC showing severe single vessel CAD with CTO of pRCA, mild to moderate non-obs CAD involving LAD and LCx, moderately elevated left heart filling pressures, severely elevated right heart filling pressures, mild pulmonary hypertension and normal CO/CI by Fick. He was treated medically.  2 CHF admissions 2021. 6 admissions in 2022.    Admitted 7/22 with NSTEMI, but treated medically due to continued cocaine use and concern for poor medication adherence if stent was needed.   Admitted 8/22 with CHF exacerbation.  Echo with EF <10%, medications restarted. Farxiga and spiro added.    He was seen in [facility] 9/22, doing well from CHF standpoint and compliant with medication regimen.   Lost to follow up.   Today he presents to establish care with the AHF clinic for his CHF. He is staying in room paid for by a local homeless shelter. He has  been feeling great. No significant exertional dyspnea with walking or activity. Denies palpitations, abnormal bleeding, CP, dizziness, edema, or PND/Orthopnea. Appetite ok. No fever or chills. He does not weigh at home. Taking all medications. He has not used cocaine in 3-4 months, and drinks ETOH occasionally (~ once a month). He has a daughter and a son, both live in [place], and a 63 year old grandson.  ReDs: 42%  ECG (personally reviewed): SR with 1st degree AVB with PVCs  Labs (9/22): K 3.9, creatinine 1.67  Review of Systems: [y] = yes, [ ]  = no   General: Weight gain [ ] ; Weight loss [ ] ; Anorexia [ ] ; Fatigue [ ] ; Fever [ ] ; Chills [ ] ; Weakness [ ]   Cardiac: Chest pain/pressure [ ] ; Resting SOB [ ] ; Exertional SOB [ ] ; Orthopnea [ ] ; Pedal Edema [ ] ; Palpitations [ ] ; Syncope [ ] ; Presyncope [ ] ; Paroxysmal nocturnal dyspnea[ ]   Pulmonary: Cough [ ] ; Wheezing[ ] ; Hemoptysis[ ] ; Sputum [ ] ; Snoring [ ]   GI: Vomiting[ ] ; Dysphagia[ ] ; Melena[ ] ; Hematochezia [ ] ; Heartburn[ ] ; Abdominal pain [ ] ; Constipation [ ] ; Diarrhea [ ] ; BRBPR [ ]   GU: Hematuria[ ] ; Dysuria [ ] ; Nocturia[ ]   Vascular: Pain in legs with walking [ ] ; Pain in feet with lying flat [ ] ; Non-healing sores [ ] ; Stroke [ ] ; TIA [ ] ; Slurred speech [ ] ;  Neuro: Headaches[ ] ; Vertigo[ ] ; Seizures[ ] ; Paresthesias[ ] ;Blurred vision [ ] ; Diplopia [ ] ; Vision changes [ ]   Ortho/Skin: Arthritis [ ] ; Joint  pain [ ] ; Muscle pain [ ] ; Joint swelling [ ] ; Back Pain [ ] ; Rash [ ]   Psych: Depression[ ] ; Anxiety[ ]   Heme: Bleeding problems [ ] ; Clotting disorders [ ] ; Anemia [ ]   Endocrine: Diabetes [y]; Thyroid dysfunction[ ]   Past Medical History:  Diagnosis Date   Alcohol abuse    CAD (coronary artery disease)    a. 05/2019 Cath: LM nl, LAD 15p, 62m, LCX large/nl, OM2 30, OM3 20, RCA 100 CTO w/ bridging L->R collats to RPDA.   Chronic combined systolic and diastolic CHF (congestive heart failure) ([place])    a. 09/27/18 Echo:  EF 20-25%, grade 2 DD; b. 03/2019 Echo: EF 20-25%; c. 05/2021 Echo: EF <10%, glob HK. GrII DD. Sev red RV fxn. Sev BAE. Mild MR. Mild-mod TR.   CKD (chronic kidney disease), stage II    Cocaine abuse ([place])    Hyperlipidemia LDL goal <70    Hypertension    Ischemic cardiomyopathy    a. 09/27/18 Echo: EF 20-25%, grade 2 DD; b. 03/2019 Echo: EF 20-25%; c. 05/2019 Cath: RCA 100 CTA, otw nonobs dzs; d. 05/2021 Echo: EF <10%, glob HK. GrII DD. Sev red RV fxn.   Morbid obesity ([place])    Noncompliance    Normocytic anemia    Current Outpatient Medications  Medication Sig Dispense Refill   acetaminophen (TYLENOL) 325 MG tablet Take 2 tablets (650 mg total) by mouth every 4 (four) hours as needed for headache or mild pain.     aspirin EC 81 MG EC tablet Take 1 tablet (81 mg total) by mouth daily. Swallow whole. 30 tablet 11   atorvastatin (LIPITOR) 40 MG tablet Take 1 tablet (40 mg total) by mouth daily. 30 tablet 3   carvedilol (COREG) 3.125 MG tablet Take 1 tablet (3.125 mg total) by mouth 2 (two) times daily with a meal. 60 tablet 3   dapagliflozin propanediol (FARXIGA) 10 MG TABS tablet Take 1 tablet (10 mg total) by mouth daily. 30 tablet 3   diclofenac sodium (VOLTAREN) 1 % GEL Apply 2 g topically 4 (four) times daily as needed (pain).     hydrOXYzine (ATARAX/VISTARIL) 25 MG tablet Take 1 tablet (25 mg total) by mouth 2 (two) times daily as needed for anxiety and take 2 tablets (50 mg total) by mouth at bedtime as needed (insomnia). 90 tablet 0   sacubitril-valsartan (ENTRESTO) 24-26 MG Take 1 tablet by mouth 2 (two) times daily. 60 tablet 3   spironolactone (ALDACTONE) 25 MG tablet Take 1 tablet (25 mg total) by mouth daily. 30 tablet 3   torsemide (DEMADEX) 20 MG tablet Patient takes 1 tablet twice a day as needed.     No current facility-administered medications for this encounter.   Allergies  Allergen Reactions   Hydrocodone Itching   Social History   Socioeconomic History   Marital  status: Single    Spouse name: none   Number of children: Not on file   Years of education: Not on file   Highest education level: Not on file  Occupational History   Occupation: Disabled  Tobacco Use   Smoking status: Never   Smokeless tobacco: Never  Vaping Use   Vaping Use: Never used  Substance and Sexual Activity   Alcohol use: Yes    Comment: 2 quarts a week    Drug use: Yes    Frequency: 3.0 times per week    Types: Marijuana, Cocaine   Sexual activity: Not Currently  Other Topics Concern  Not on file  Social History Narrative   Lives w/ mother in [place] but recently visiting w/ dtr in [place].   Social Determinants of Health   Financial Resource Strain: High Risk   Difficulty of Paying Living Expenses: Hard  Food Insecurity: Food Insecurity Present   Worried About Running Out of Food in the Last Year: Sometimes true   Ran Out of Food in the Last Year: Sometimes true  Transportation Needs: Public librarian (Medical): Yes   Lack of Transportation (Non-Medical): Yes  Physical Activity: Not on file  Stress: Not on file  Social Connections: Not on file  Intimate Partner Violence: Not on file   Family History  Problem Relation Age of Onset   Hypertension Maternal Grandmother    BP (!) 118/98    Pulse 96    Wt 105.1 kg (231 lb 9.6 oz)    SpO2 96%    BMI 32.30 kg/m   Wt Readings from Last 3 Encounters:  12/11/21 105.1 kg (231 lb 9.6 oz)  07/12/21 114.6 kg (252 lb 9.6 oz)  06/28/21 114.4 kg (252 lb 3.2 oz)   PHYSICAL EXAM: General:  NAD. No resp difficulty, walked into clinic. HEENT: Normal Neck: Supple. JVP 7-8. Carotids 2+ bilat; no bruits. No lymphadenopathy or thryomegaly appreciated. Cor: PMI nondisplaced. Regular rate & rhythm. No rubs, gallops or murmurs. Lungs: Clear Abdomen: Obese, nontender, nondistended. No hepatosplenomegaly. No bruits or masses. Good bowel sounds. Extremities: No cyanosis, clubbing, rash,  edema Neuro: Alert & oriented x 3, cranial nerves grossly intact. Moves all 4 extremities w/o difficulty. Affect pleasant.  ASSESSMENT & PLAN: 1. 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 He has a history of poor medication adherence, uncontrolled HTN, alcohol and cocaine abuse. Echo (11/19) EF 20%, grade II DD. Echo (5/20) EF 20 to 25%, elevated LVEDP, diffuse hypokinesis with worse hypokinesis and inferior wall moderately enlarged RV with normal function. RHC (7/20) RA (mean) 17 mmHg, PA 36/20 (25), PCWP (mean) 24 mmHg, CO/CI 7.7/3.4 by Fick. Echo (7/22) during admission for nSTEMI showed EF < 10%, severe LV dysfunction with global HK, grade II DD, RV severely reduced, mild to moderate TR. Today, he has improved NYHA I-II symptoms, he is volume overloaded on exam. - Increase Entresto to 49/51 mg bid. BMET/BNP today, repeat BMET in 10 days. - Increase torsemide to 60 mg bid. - Continue carvedilol 3.125 mg bid.  - Continue spiro 25 mg daily.  - Continue Farxiga 10 mg daily.  - No digoxin for now with issues with follow up/med compliance. - Not advanced therapies candidate unless he can abstain from cocaine. 2. CAD: LHC (7/20): CTO of RCA treated medically. nSTEMI (7/22) treading medically due to active cocaine use and concern for DAPT compliance if stent needed; no LHC pursued. - No s/s of chest pain. - Continue ASA + atorvastatin 40 mg daily. Check LFTs and lipids next visit. - Will need eventual repeat LHC if EF does not improve with GDMT. 3. Cocaine Use Disorder: Congratulated on 3-4 months clean.  4. Alcohol Use DisorderHas cut back, advised complete cessation. 5. CKD 3b: Baseline SCr ~ 1.6. On SGLT2i. - BMET today. 6. SODH: He is staying in a hotel room provided by homeless shelter. HFSW helping with resources. - Transportation thru Medicaid benefit. - He has a phone in room. - He was gifted a scale and pillbox today. - He has Medicaid & disability. - Given list of PCPs to  establish care  locally.  Follow up in 3-4 weeks with APP (ReDs, CMET and Lipids) and in 12 weeks with Dr. Aundra Dubin + echo.  [place], FNP-BC. 12/11/21

## 2021-12-11 NOTE — Patient Instructions (Signed)
INCREASE Entresto to 49/51 mg, one tab twice a day INCREASE Torsemide to 60 mg (3 tabs) twice a day  Labs today We will only contact you if something comes back abnormal or we need to make some changes. Otherwise no news is good news!  Labs needed in 10-14 days  Your physician recommends that you schedule a follow-up appointment in: 3-4 weeks  in the Advanced Practitioners (PA/NP) Clinic and in 12 week with Dr [name] and echo  Your physician has requested that you have an echocardiogram. Echocardiography is a painless test that uses sound waves to create images of your heart. It provides your doctor with information about the size and shape of your heart and how well your hearts chambers and valves are working. This procedure takes approximately one hour. There are no restrictions for this procedure.  Do the following things EVERYDAY: Weigh yourself in the morning before breakfast. Write it down and keep it in a log. Take your medicines as prescribed Eat low salt foods--Limit salt (sodium) to 2000 mg per day.  Stay as active as you can everyday Limit all fluids for the day to less than 2 liters  At the [facility], you and your health needs are our priority. As part of our continuing mission to provide you with exceptional heart care, we have created designated Provider Care Teams. These Care Teams include your primary Cardiologist (physician) and Advanced Practice Providers (APPs- Physician Assistants and Nurse Practitioners) who all work together to provide you with the care you need, when you need it.   You may see any of the following providers on your designated Care Team at your next follow up: Dr Arvilla Meres Dr Carron Curie, NP Robbie Lis, [place] [facility] [place], [place] Karle Plumber, PharmD   Please be sure to bring in all your medications bottles to every appointment.   If you have any questions or concerns before your next  appointment please send [place] a message through [place] or call our office at [phone].    TO LEAVE A MESSAGE FOR THE NURSE SELECT OPTION 2, PLEASE LEAVE A MESSAGE INCLUDING: YOUR NAME DATE OF BIRTH CALL BACK NUMBER REASON FOR CALL**this is important as we prioritize the call backs  YOU WILL RECEIVE A CALL BACK THE SAME DAY AS LONG AS YOU CALL BEFORE 4:00 PM

## 2021-12-11 NOTE — Progress Notes (Signed)
ReDS Vest / Clip - 12/11/21 0900       ReDS Vest / Clip   Station Marker D    Ruler Value 34    ReDS Value Range High volume overload    ReDS Actual Value 42    Anatomical Comments sitting

## 2021-12-11 NOTE — Progress Notes (Signed)
Heart and Vascular Care Navigation  12/11/2021  Donterius Filley [facility] 10/01/1961 [id number]  Reason for Referral: transportation concerns   Engaged with patient face to face for initial visit for Heart and Vascular Care Coordination.                                                                                                   Assessment:     Pt has been utilizing [facility] to get to appts.  CSW explained that this would no longer be a readily available option once [employer] disbanded.  Explained that pt would need to utilize his Medicaid transport benefit- he will plan to call [employer] to get reestated to use.  Is techniqually under [facility] so CSW ensured he had this number to call and discuss with his case Financial controller.  CSW also checked in regarding housing as pt is staying in hotel which he states is paid for through [employer]).  States he was staying in the shelter but they discovered he is on the sex offender registry so he was no longer allowed to stay in the main shelter and was transferred 3 weeks ago to this hotel.  He is unsure how long this program will last.  States he is in process of fighting the sex offender charge and owes $1,500 which he states will expunge it from his record.  Pt gets $900 a month from disability so plans to spend this and next months pay to accomplish this then work on finding his own housing with his check.                                   HRT/VAS Care Coordination     Patients Home Cardiology Office --  [facility]   Outpatient Care Team Social Worker   Social Worker Name: Quentin Angst, [place] [phone]   Living arrangements for the past 2 months Hotel/Motel   Lives with: Self   Patient Current Insurance Coverage Medicaid   Patient Has Concern With Paying Medical Bills No   Does Patient Have Prescription Coverage? Yes   Home Assistive Devices/Equipment Cane (specify quad or straight)   DME Agency  [employer]   [facility] Agency NA       Social History:                                                                             SDOH Screenings   Alcohol Screen: Not on file  Depression (OIB7-0): Not on file  Financial Resource Strain: High Risk   Difficulty of Paying Living Expenses: Hard  Food Insecurity: Food Insecurity Present   Worried About Running Out of Food in the Last Year: Sometimes true  Ran Out of Food in the Last Year: Sometimes true  Housing: High Risk   Last Housing Risk Score: 2  Physical Activity: Not on file  Social Connections: Not on file  Stress: Not on file  Tobacco Use: Low Risk    Smoking Tobacco Use: Never   Smokeless Tobacco Use: Never   Passive Exposure: Not on file  Transportation Needs: Unmet Transportation Needs   Lack of Transportation (Medical): Yes   Lack of Transportation (Non-Medical): Yes   Follow-up plan:     No further needs at this time- pt will get set up to use Medicaid transport  Burna Sis, LCSW Clinical Social Worker [facility] Desk#: [phone] Cell#: [phone]

## 2021-12-21 ENCOUNTER — Other Ambulatory Visit (HOSPITAL_COMMUNITY): Payer: Medicaid Other

## 2021-12-26 ENCOUNTER — Telehealth (HOSPITAL_COMMUNITY): Payer: Self-pay | Admitting: Licensed Clinical Social Worker

## 2021-12-26 ENCOUNTER — Other Ambulatory Visit: Payer: Self-pay

## 2021-12-26 ENCOUNTER — Ambulatory Visit (HOSPITAL_COMMUNITY)
Admission: RE | Admit: 2021-12-26 | Discharge: 2021-12-26 | Disposition: A | Discharge status: Home / Self Care | Payer: Medicaid Other | Source: Ambulatory Visit | Attending: Cardiology | Admitting: Cardiology

## 2021-12-26 DIAGNOSIS — I509 Heart failure, unspecified: Secondary | ICD-10-CM | POA: Insufficient documentation

## 2021-12-26 LAB — BASIC METABOLIC PANEL
Anion gap: 8 (ref 5–15)
BUN: 24 mg/dL — ABNORMAL HIGH (ref 6–20)
CO2: 29 mmol/L (ref 22–32)
Calcium: 8.7 mg/dL — ABNORMAL LOW (ref 8.9–10.3)
Chloride: 101 mmol/L (ref 98–111)
Creatinine, Ser: 1.46 mg/dL — ABNORMAL HIGH (ref 0.61–1.24)
GFR, Estimated: 55 mL/min — ABNORMAL LOW (ref >60)
Glucose, Bld: 104 mg/dL — ABNORMAL HIGH (ref 70–99)
Potassium: 3.9 mmol/L (ref 3.5–5.1)
Sodium: 138 mmol/L (ref 135–145)

## 2021-12-26 NOTE — Telephone Encounter (Signed)
CSW received call from pt to get help coming to clinic for lab appt today.  Already had [facility] set up but driver couldn't locate him- CSW arranged another ride for pt.  Burna Sis, LCSW Clinical Social Worker [facility] Desk#: [phone] Cell#: [phone]

## 2022-01-08 ENCOUNTER — Telehealth (HOSPITAL_COMMUNITY): Payer: Self-pay

## 2022-01-08 NOTE — Telephone Encounter (Signed)
Called and left patient a message to confirm/remind patient of their appointment at the [facility] on 01/09/22.  ? ? ?

## 2022-01-09 ENCOUNTER — Encounter (HOSPITAL_COMMUNITY): Payer: Medicaid Other

## 2022-01-09 NOTE — Progress Notes (Incomplete)
ADVANCED HF CLINIC CONSULT NOTE  Referring Physician: Dr. Vickki Muff Primary Care: [facility] HF Cardiologist: Dr. Aundra Dubin  HPI: Dale [name] is a 61 y.o. male  with a PMH significant for hypertension, polysubstance use (including cocaine & alcohol), chronic combined CHF, and CAD.   He was admitted 11/19 with signs/symptoms of CHF, in the setting of poorly controlled hypertension alcohol and cocaine use. He was started on IV Lasix and echo showed EF 20%, G2 DD.  He left AMA.   Readmitted 12/19 with CHF exacerbation, started on IV diuretics, beta-blocker, ARB and BiDil.  Discharged on oral torsemide.  He remained positive for cocaine on UDS.   Admitted 4/20 with chest pain and CHF. Diagnosed with NSTEMI. He declined cardiac catheterization.     Admitted 5/20 with CHF and chest pain, still using cocaine, but no evidence of ischemia.  Diuresed and discharged.  Echo (5/20) EF 20 to 25%, elevated LVEDP, diffuse hypokinesis with worse hypokinesis and inferior wall moderately enlarged RV with normal function.  Admitted (7/20) with acute CHF and underwent L/RHC showing severe single vessel CAD with CTO of pRCA, mild to moderate non-obs CAD involving LAD and LCx, moderately elevated left heart filling pressures, severely elevated right heart filling pressures, mild pulmonary hypertension and normal CO/CI by Fick. He was treated medically.  2 CHF admissions 2021. 6 admissions in 2022.    Admitted 7/22 with NSTEMI, but treated medically due to continued cocaine use and concern for poor medication adherence if stent was needed.   Admitted 8/22 with CHF exacerbation.  Echo with EF <10%, medications restarted. Farxiga and spiro added.    He was seen in [facility] 9/22, doing well from CHF standpoint and compliant with medication regimen.   Lost to follow up.   Today he presents to establish care with the AHF clinic for his CHF. He is staying in room paid for by a local homeless shelter. He has  been feeling great. No significant exertional dyspnea with walking or activity. Denies palpitations, abnormal bleeding, CP, dizziness, edema, or PND/Orthopnea. Appetite ok. No fever or chills. He does not weigh at home. Taking all medications. He has not used cocaine in 3-4 months, and drinks ETOH occasionally (~ once a month). He has a daughter and a son, both live in [place], and a 70 year old grandson.  ReDs: 42%  ECG (personally reviewed): SR with 1st degree AVB with PVCs  Labs (9/22): K 3.9, creatinine 1.67  Review of Systems: [y] = yes, [ ]  = no   General: Weight gain [ ] ; Weight loss [ ] ; Anorexia [ ] ; Fatigue [ ] ; Fever [ ] ; Chills [ ] ; Weakness [ ]   Cardiac: Chest pain/pressure [ ] ; Resting SOB [ ] ; Exertional SOB [ ] ; Orthopnea [ ] ; Pedal Edema [ ] ; Palpitations [ ] ; Syncope [ ] ; Presyncope [ ] ; Paroxysmal nocturnal dyspnea[ ]   Pulmonary: Cough [ ] ; Wheezing[ ] ; Hemoptysis[ ] ; Sputum [ ] ; Snoring [ ]   GI: Vomiting[ ] ; Dysphagia[ ] ; Melena[ ] ; Hematochezia [ ] ; Heartburn[ ] ; Abdominal pain [ ] ; Constipation [ ] ; Diarrhea [ ] ; BRBPR [ ]   GU: Hematuria[ ] ; Dysuria [ ] ; Nocturia[ ]   Vascular: Pain in legs with walking [ ] ; Pain in feet with lying flat [ ] ; Non-healing sores [ ] ; Stroke [ ] ; TIA [ ] ; Slurred speech [ ] ;  Neuro: Headaches[ ] ; Vertigo[ ] ; Seizures[ ] ; Paresthesias[ ] ;Blurred vision [ ] ; Diplopia [ ] ; Vision changes [ ]   Ortho/Skin: Arthritis [ ] ; Joint  pain [ ] ; Muscle pain [ ] ; Joint swelling [ ] ; Back Pain [ ] ; Rash [ ]   Psych: Depression[ ] ; Anxiety[ ]   Heme: Bleeding problems [ ] ; Clotting disorders [ ] ; Anemia [ ]   Endocrine: Diabetes [y]; Thyroid dysfunction[ ]   Past Medical History:  Diagnosis Date   Alcohol abuse    CAD (coronary artery disease)    a. 05/2019 Cath: LM nl, LAD 15p, 55m, LCX large/nl, OM2 30, OM3 20, RCA 100 CTO w/ bridging L->R collats to RPDA.   Chronic combined systolic and diastolic CHF (congestive heart failure) ([place])    a. 09/27/18 Echo:  EF 20-25%, grade 2 DD; b. 03/2019 Echo: EF 20-25%; c. 05/2021 Echo: EF <10%, glob HK. GrII DD. Sev red RV fxn. Sev BAE. Mild MR. Mild-mod TR.   CKD (chronic kidney disease), stage II    Cocaine abuse ([place])    Hyperlipidemia LDL goal <70    Hypertension    Ischemic cardiomyopathy    a. 09/27/18 Echo: EF 20-25%, grade 2 DD; b. 03/2019 Echo: EF 20-25%; c. 05/2019 Cath: RCA 100 CTA, otw nonobs dzs; d. 05/2021 Echo: EF <10%, glob HK. GrII DD. Sev red RV fxn.   Morbid obesity ([place])    Noncompliance    Normocytic anemia    Current Outpatient Medications  Medication Sig Dispense Refill   acetaminophen (TYLENOL) 325 MG tablet Take 2 tablets (650 mg total) by mouth every 4 (four) hours as needed for headache or mild pain.     aspirin EC 81 MG EC tablet Take 1 tablet (81 mg total) by mouth daily. Swallow whole. 30 tablet 11   atorvastatin (LIPITOR) 40 MG tablet Take 1 tablet (40 mg total) by mouth daily. 30 tablet 3   carvedilol (COREG) 3.125 MG tablet Take 1 tablet (3.125 mg total) by mouth 2 (two) times daily with a meal. 60 tablet 3   dapagliflozin propanediol (FARXIGA) 10 MG TABS tablet Take 1 tablet (10 mg total) by mouth daily. 30 tablet 3   diclofenac sodium (VOLTAREN) 1 % GEL Apply 2 g topically 4 (four) times daily as needed (pain).     hydrOXYzine (ATARAX/VISTARIL) 25 MG tablet Take 1 tablet (25 mg total) by mouth 2 (two) times daily as needed for anxiety and take 2 tablets (50 mg total) by mouth at bedtime as needed (insomnia). 90 tablet 0   sacubitril-valsartan (ENTRESTO) 49-51 MG Take 1 tablet by mouth 2 (two) times daily. 60 tablet 6   spironolactone (ALDACTONE) 25 MG tablet Take 1 tablet (25 mg total) by mouth daily. 30 tablet 3   torsemide (DEMADEX) 20 MG tablet Take 3 tablets (60 mg total) by mouth 2 (two) times daily. Please cancel all previous orders for current medication. Change in dosage or pill size. 180 tablet 3   No current facility-administered medications for this visit.    Allergies  Allergen Reactions   Hydrocodone Itching   Social History   Socioeconomic History   Marital status: Single    Spouse name: none   Number of children: Not on file   Years of education: Not on file   Highest education level: Not on file  Occupational History   Occupation: Disabled  Tobacco Use   Smoking status: Never   Smokeless tobacco: Never  Vaping Use   Vaping Use: Never used  Substance and Sexual Activity   Alcohol use: Yes    Comment: 2 quarts a week    Drug use: Yes    Frequency: 3.0 times  per week    Types: Marijuana, Cocaine   Sexual activity: Not Currently  Other Topics Concern   Not on file  Social History Narrative   Lives w/ mother in [place] but recently visiting w/ dtr in [place].   Social Determinants of Health   Financial Resource Strain: High Risk   Difficulty of Paying Living Expenses: Hard  Food Insecurity: Food Insecurity Present   Worried About Charity fundraiser in the Last Year: Sometimes true   Arboriculturist in the Last Year: Sometimes true  Transportation Needs: Unmet Transportation Needs   Lack of Transportation (Medical): Yes   Lack of Transportation (Non-Medical): Yes  Physical Activity: Not on file  Stress: Not on file  Social Connections: Not on file  Intimate Partner Violence: Not on file   Family History  Problem Relation Age of Onset   Hypertension Maternal Grandmother    There were no vitals taken for this visit.  Wt Readings from Last 3 Encounters:  12/11/21 105.1 kg (231 lb 9.6 oz)  07/12/21 114.6 kg (252 lb 9.6 oz)  06/28/21 114.4 kg (252 lb 3.2 oz)   PHYSICAL EXAM: General:  NAD. No resp difficulty, walked into clinic. HEENT: Normal Neck: Supple. JVP 7-8. Carotids 2+ bilat; no bruits. No lymphadenopathy or thryomegaly appreciated. Cor: PMI nondisplaced. Regular rate & rhythm. No rubs, gallops or murmurs. Lungs: Clear Abdomen: Obese, nontender, nondistended. No hepatosplenomegaly. No bruits or masses.  Good bowel sounds. Extremities: No cyanosis, clubbing, rash, edema Neuro: Alert & oriented x 3, cranial nerves grossly intact. Moves all 4 extremities w/o difficulty. Affect pleasant.  ASSESSMENT & PLAN: 1. 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 He has a history of poor medication adherence, uncontrolled HTN, alcohol and cocaine abuse. Echo (11/19) EF 20%, grade II DD. Echo (5/20) EF 20 to 25%, elevated LVEDP, diffuse hypokinesis with worse hypokinesis and inferior wall moderately enlarged RV with normal function. RHC (7/20) RA (mean) 17 mmHg, PA 36/20 (25), PCWP (mean) 24 mmHg, CO/CI 7.7/3.4 by Fick. Echo (7/22) during admission for nSTEMI showed EF < 10%, severe LV dysfunction with global HK, grade II DD, RV severely reduced, mild to moderate TR. Today, he has improved NYHA I-II symptoms, he is volume overloaded on exam. - Increase Entresto to 49/51 mg bid. BMET/BNP today, repeat BMET in 10 days. - Increase torsemide to 60 mg bid. - Continue carvedilol 3.125 mg bid.  - Continue spiro 25 mg daily.  - Continue Farxiga 10 mg daily.  - No digoxin for now with issues with follow up/med compliance. - Not advanced therapies candidate unless he can abstain from cocaine. 2. CAD: LHC (7/20): CTO of RCA treated medically. nSTEMI (7/22) treading medically due to active cocaine use and concern for DAPT compliance if stent needed; no LHC pursued. - No s/s of chest pain. - Continue ASA + atorvastatin 40 mg daily. Check LFTs and lipids next visit. - Will need eventual repeat LHC if EF does not improve with GDMT. 3. Cocaine Use Disorder: Congratulated on 3-4 months clean.  4. Alcohol Use DisorderHas cut back, advised complete cessation. 5. CKD 3b: Baseline SCr ~ 1.6. On SGLT2i. - BMET today. 6. SODH: He is staying in a hotel room provided by homeless shelter. HFSW helping with resources. - Transportation thru Medicaid benefit. - He has a phone in room. - He was gifted a scale and pillbox  today. - He has Medicaid & disability. - Given list of PCPs to establish care locally.  Follow up in 3-4 weeks with APP (ReDs, CMET and Lipids) and in 12 weeks with Dr. Aundra Dubin + echo.  [place], FNP-BC. 01/09/22

## 2022-01-30 ENCOUNTER — Telehealth (HOSPITAL_COMMUNITY): Payer: Self-pay

## 2022-01-30 NOTE — Telephone Encounter (Signed)
Called and left patient a detailed message to confirm/remind patient of their appointment at the [facility] on 01/31/22.  ? ?

## 2022-01-31 ENCOUNTER — Encounter (HOSPITAL_COMMUNITY): Payer: Medicaid Other

## 2022-02-07 ENCOUNTER — Telehealth (HOSPITAL_COMMUNITY): Payer: Self-pay

## 2022-02-07 NOTE — Telephone Encounter (Signed)
Called and left patient a partial message to confirm/remind patient of their appointment at the [facility] on 02/08/22.  ? ?

## 2022-02-08 ENCOUNTER — Encounter (HOSPITAL_COMMUNITY): Payer: Medicaid Other

## 2022-02-19 ENCOUNTER — Other Ambulatory Visit: Payer: Self-pay

## 2022-02-19 ENCOUNTER — Inpatient Hospital Stay (HOSPITAL_COMMUNITY): Payer: Medicaid Other

## 2022-02-19 ENCOUNTER — Encounter (HOSPITAL_COMMUNITY): Payer: Self-pay

## 2022-02-19 ENCOUNTER — Inpatient Hospital Stay: Payer: Self-pay

## 2022-02-19 ENCOUNTER — Inpatient Hospital Stay (HOSPITAL_COMMUNITY)
Admission: EM | Admit: 2022-02-19 | Discharge: 2022-02-22 | DRG: 291 | Disposition: A | Discharge status: Home / Self Care | Payer: Medicaid Other | Attending: Infectious Diseases | Admitting: Infectious Diseases

## 2022-02-19 ENCOUNTER — Emergency Department (HOSPITAL_COMMUNITY): Payer: Medicaid Other

## 2022-02-19 DIAGNOSIS — Z7982 Long term (current) use of aspirin: Secondary | ICD-10-CM

## 2022-02-19 DIAGNOSIS — N1832 Chronic kidney disease, stage 3b: Secondary | ICD-10-CM | POA: Diagnosis present

## 2022-02-19 DIAGNOSIS — Z79899 Other long term (current) drug therapy: Secondary | ICD-10-CM | POA: Diagnosis not present

## 2022-02-19 DIAGNOSIS — N179 Acute kidney failure, unspecified: Secondary | ICD-10-CM | POA: Diagnosis present

## 2022-02-19 DIAGNOSIS — I5043 Acute on chronic combined systolic (congestive) and diastolic (congestive) heart failure: Secondary | ICD-10-CM

## 2022-02-19 DIAGNOSIS — F1010 Alcohol abuse, uncomplicated: Secondary | ICD-10-CM | POA: Diagnosis present

## 2022-02-19 DIAGNOSIS — Z885 Allergy status to narcotic agent status: Secondary | ICD-10-CM

## 2022-02-19 DIAGNOSIS — E785 Hyperlipidemia, unspecified: Secondary | ICD-10-CM | POA: Diagnosis present

## 2022-02-19 DIAGNOSIS — N183 Chronic kidney disease, stage 3 unspecified: Secondary | ICD-10-CM

## 2022-02-19 DIAGNOSIS — I472 Ventricular tachycardia, unspecified: Secondary | ICD-10-CM | POA: Diagnosis not present

## 2022-02-19 DIAGNOSIS — Z8249 Family history of ischemic heart disease and other diseases of the circulatory system: Secondary | ICD-10-CM | POA: Diagnosis not present

## 2022-02-19 DIAGNOSIS — E876 Hypokalemia: Secondary | ICD-10-CM | POA: Diagnosis present

## 2022-02-19 DIAGNOSIS — I5023 Acute on chronic systolic (congestive) heart failure: Secondary | ICD-10-CM

## 2022-02-19 DIAGNOSIS — L03011 Cellulitis of right finger: Secondary | ICD-10-CM | POA: Diagnosis present

## 2022-02-19 DIAGNOSIS — E669 Obesity, unspecified: Secondary | ICD-10-CM | POA: Diagnosis present

## 2022-02-19 DIAGNOSIS — Z7984 Long term (current) use of oral hypoglycemic drugs: Secondary | ICD-10-CM | POA: Diagnosis not present

## 2022-02-19 DIAGNOSIS — Z5900 Homelessness unspecified: Secondary | ICD-10-CM | POA: Diagnosis not present

## 2022-02-19 DIAGNOSIS — I428 Other cardiomyopathies: Secondary | ICD-10-CM | POA: Diagnosis present

## 2022-02-19 DIAGNOSIS — I130 Hypertensive heart and chronic kidney disease with heart failure and stage 1 through stage 4 chronic kidney disease, or unspecified chronic kidney disease: Principal | ICD-10-CM | POA: Diagnosis present

## 2022-02-19 DIAGNOSIS — I2510 Atherosclerotic heart disease of native coronary artery without angina pectoris: Secondary | ICD-10-CM | POA: Diagnosis present

## 2022-02-19 DIAGNOSIS — Z888 Allergy status to other drugs, medicaments and biological substances status: Secondary | ICD-10-CM

## 2022-02-19 DIAGNOSIS — I509 Heart failure, unspecified: Secondary | ICD-10-CM

## 2022-02-19 DIAGNOSIS — Z6835 Body mass index (BMI) 35.0-35.9, adult: Secondary | ICD-10-CM | POA: Diagnosis not present

## 2022-02-19 DIAGNOSIS — R778 Other specified abnormalities of plasma proteins: Secondary | ICD-10-CM

## 2022-02-19 LAB — ECHOCARDIOGRAM COMPLETE
AR max vel: 2.88 cm2
AV Area VTI: 2.09 cm2
AV Area mean vel: 2.23 cm2
AV Mean grad: 2 mmHg
AV Peak grad: 3.2 mmHg
Ao pk vel: 0.9 m/s
Area-P 1/2: 3.54 cm2
S' Lateral: 7.2 cm

## 2022-02-19 LAB — COMPREHENSIVE METABOLIC PANEL
ALT: 18 U/L (ref 0–44)
AST: 24 U/L (ref 15–41)
Albumin: 3.7 g/dL (ref 3.5–5.0)
Alkaline Phosphatase: 88 U/L (ref 38–126)
Anion gap: 13 (ref 5–15)
BUN: 46 mg/dL — ABNORMAL HIGH (ref 6–20)
CO2: 22 mmol/L (ref 22–32)
Calcium: 9 mg/dL (ref 8.9–10.3)
Chloride: 103 mmol/L (ref 98–111)
Creatinine, Ser: 2 mg/dL — ABNORMAL HIGH (ref 0.61–1.24)
GFR, Estimated: 38 mL/min — ABNORMAL LOW (ref >60)
Glucose, Bld: 168 mg/dL — ABNORMAL HIGH (ref 70–99)
Potassium: 3.1 mmol/L — ABNORMAL LOW (ref 3.5–5.1)
Sodium: 138 mmol/L (ref 135–145)
Total Bilirubin: 1.8 mg/dL — ABNORMAL HIGH (ref 0.3–1.2)
Total Protein: 7.6 g/dL (ref 6.5–8.1)

## 2022-02-19 LAB — CBC WITH DIFFERENTIAL/PLATELET
Abs Immature Granulocytes: 0.12 10*3/uL — ABNORMAL HIGH (ref 0.00–0.07)
Basophils Absolute: 0 10*3/uL (ref 0.0–0.1)
Basophils Relative: 1 %
Eosinophils Absolute: 0 10*3/uL (ref 0.0–0.5)
Eosinophils Relative: 0 %
HCT: 37.1 % — ABNORMAL LOW (ref 39.0–52.0)
Hemoglobin: 12.4 g/dL — ABNORMAL LOW (ref 13.0–17.0)
Immature Granulocytes: 2 %
Lymphocytes Relative: 8 %
Lymphs Abs: 0.6 10*3/uL — ABNORMAL LOW (ref 0.7–4.0)
MCH: 31.8 pg (ref 26.0–34.0)
MCHC: 33.4 g/dL (ref 30.0–36.0)
MCV: 95.1 fL (ref 80.0–100.0)
Monocytes Absolute: 0.6 10*3/uL (ref 0.1–1.0)
Monocytes Relative: 7 %
Neutro Abs: 6.9 10*3/uL (ref 1.7–7.7)
Neutrophils Relative %: 82 %
Platelets: 245 10*3/uL (ref 150–400)
RBC: 3.9 MIL/uL — ABNORMAL LOW (ref 4.22–5.81)
RDW: 14.8 % (ref 11.5–15.5)
WBC: 8.3 10*3/uL (ref 4.0–10.5)
nRBC: 0.2 % (ref 0.0–0.2)

## 2022-02-19 LAB — RAPID URINE DRUG SCREEN, HOSP PERFORMED
Amphetamines: NOT DETECTED
Barbiturates: NOT DETECTED
Benzodiazepines: NOT DETECTED
Cocaine: NOT DETECTED
Opiates: NOT DETECTED
Tetrahydrocannabinol: POSITIVE — AB

## 2022-02-19 LAB — BRAIN NATRIURETIC PEPTIDE: B Natriuretic Peptide: >4500 pg/mL — ABNORMAL HIGH (ref 0.0–100.0)

## 2022-02-19 LAB — TROPONIN I (HIGH SENSITIVITY)
Troponin I (High Sensitivity): 34 ng/L — ABNORMAL HIGH (ref <18)
Troponin I (High Sensitivity): 35 ng/L — ABNORMAL HIGH (ref <18)

## 2022-02-19 LAB — MAGNESIUM: Magnesium: 2.2 mg/dL (ref 1.7–2.4)

## 2022-02-19 LAB — PROCALCITONIN: Procalcitonin: <0.1 ng/mL

## 2022-02-19 MED ORDER — POLYETHYLENE GLYCOL 3350 17 G PO PACK: 17.0000 g | PACK | Freq: Every day | ORAL | Status: DC | PRN

## 2022-02-19 MED ORDER — ENOXAPARIN SODIUM 30 MG/0.3ML IJ SOSY: 30.0000 mg | PREFILLED_SYRINGE | INTRAMUSCULAR | Status: DC

## 2022-02-19 MED ORDER — FUROSEMIDE 10 MG/ML IJ SOLN
80.0000 mg | Freq: Two times a day (BID) | INTRAMUSCULAR | Status: DC
  Administered 2022-02-19: 80 mg via INTRAVENOUS
  Filled 2022-02-19: qty 8

## 2022-02-19 MED ORDER — FUROSEMIDE 10 MG/ML IJ SOLN: 80.0000 mg | Freq: Two times a day (BID) | INTRAMUSCULAR | Status: DC

## 2022-02-19 MED ORDER — LIDOCAINE-EPINEPHRINE (PF) 2 %-1:200000 IJ SOLN
20.0000 mL | Freq: Once | INTRAMUSCULAR | Status: DC
  Filled 2022-02-19: qty 20

## 2022-02-19 MED ORDER — ATORVASTATIN CALCIUM 40 MG PO TABS
40.0000 mg | ORAL_TABLET | Freq: Every day | ORAL | Status: DC
  Administered 2022-02-19 – 2022-02-22 (×4): 40 mg via ORAL
  Filled 2022-02-19 (×4): qty 1

## 2022-02-19 MED ORDER — ACETAMINOPHEN 325 MG PO TABS: 650.0000 mg | ORAL_TABLET | Freq: Four times a day (QID) | ORAL | Status: DC | PRN

## 2022-02-19 MED ORDER — FUROSEMIDE 10 MG/ML IJ SOLN
12.0000 mg/h | INTRAVENOUS | Status: DC
  Administered 2022-02-19 – 2022-02-21 (×4): 12 mg/h via INTRAVENOUS
  Filled 2022-02-19 (×4): qty 20

## 2022-02-19 MED ORDER — ACETAMINOPHEN 650 MG RE SUPP
650.0000 mg | Freq: Four times a day (QID) | RECTAL | Status: DC | PRN
Start: 2022-02-19 — End: 2022-02-22

## 2022-02-19 MED ORDER — SPIRONOLACTONE 25 MG PO TABS
25.0000 mg | ORAL_TABLET | Freq: Every day | ORAL | Status: DC
  Administered 2022-02-19 – 2022-02-20 (×2): 25 mg via ORAL
  Filled 2022-02-19 (×4): qty 1

## 2022-02-19 MED ORDER — HYDRALAZINE HCL 25 MG PO TABS
12.5000 mg | ORAL_TABLET | Freq: Three times a day (TID) | ORAL | Status: DC
  Administered 2022-02-19 – 2022-02-21 (×6): 12.5 mg via ORAL
  Filled 2022-02-19 (×7): qty 1

## 2022-02-19 MED ORDER — ASPIRIN EC 81 MG PO TBEC
81.0000 mg | DELAYED_RELEASE_TABLET | Freq: Every day | ORAL | Status: DC
  Administered 2022-02-19 – 2022-02-22 (×4): 81 mg via ORAL
  Filled 2022-02-19 (×4): qty 1

## 2022-02-19 MED ORDER — MILRINONE LACTATE IN DEXTROSE 20-5 MG/100ML-% IV SOLN
0.1250 ug/kg/min | INTRAVENOUS | Status: DC
  Administered 2022-02-19 – 2022-02-22 (×6): 0.25 ug/kg/min via INTRAVENOUS
  Filled 2022-02-19 (×6): qty 100

## 2022-02-19 MED ORDER — DAPAGLIFLOZIN PROPANEDIOL 10 MG PO TABS
10.0000 mg | ORAL_TABLET | Freq: Every day | ORAL | Status: DC
  Administered 2022-02-19 – 2022-02-22 (×3): 10 mg via ORAL
  Filled 2022-02-19 (×4): qty 1

## 2022-02-19 MED ORDER — FUROSEMIDE 10 MG/ML IJ SOLN: 20.0000 mg | Freq: Once | INTRAMUSCULAR | Status: DC

## 2022-02-19 MED ORDER — POTASSIUM CHLORIDE CRYS ER 20 MEQ PO TBCR
40.0000 meq | EXTENDED_RELEASE_TABLET | Freq: Two times a day (BID) | ORAL | Status: DC
  Administered 2022-02-19 – 2022-02-22 (×7): 40 meq via ORAL
  Filled 2022-02-19 (×7): qty 2

## 2022-02-19 MED ORDER — ISOSORBIDE MONONITRATE ER 30 MG PO TB24
15.0000 mg | ORAL_TABLET | Freq: Every day | ORAL | Status: DC
  Administered 2022-02-19 – 2022-02-20 (×2): 15 mg via ORAL
  Filled 2022-02-19 (×3): qty 1

## 2022-02-19 MED ORDER — CALCIUM CARBONATE ANTACID 500 MG PO CHEW
1.0000 | CHEWABLE_TABLET | Freq: Every day | ORAL | Status: DC
  Administered 2022-02-19 – 2022-02-22 (×4): 200 mg via ORAL
  Filled 2022-02-19 (×4): qty 1

## 2022-02-19 MED ORDER — POTASSIUM CHLORIDE CRYS ER 20 MEQ PO TBCR
40.0000 meq | EXTENDED_RELEASE_TABLET | Freq: Once | ORAL | Status: AC
  Administered 2022-02-19: 40 meq via ORAL
  Filled 2022-02-19: qty 2

## 2022-02-19 MED ORDER — FUROSEMIDE 10 MG/ML IJ SOLN
60.0000 mg | Freq: Once | INTRAMUSCULAR | Status: AC
Start: 2022-02-19 — End: 2022-02-19
  Administered 2022-02-19: 60 mg via INTRAVENOUS
  Filled 2022-02-19: qty 6

## 2022-02-19 NOTE — H&P (Addendum)
? ?Date: 02/19/2022     ?Patient Name:  Dale [name] MRN: [id number]  ?DOB: [birth year] Age / Sex: 61 y.o., male   ?PCP: Thayer Headings, MD    ?     ?Medical Service: Internal Medicine Teaching Service  ?     ?Attending Physician: Dr. Johnnye Sima, Doroteo Bradford, MD    ?Intern (1st Contact): Dr. Earley Favor Pager: [phone]  ?Resident (2nd Contact): Jeanie Cooks, MD Pager: Governor Rooks [phone]  ?     ?After Hours (After 5p/  First Contact Pager: [phone]  ?weekends / holidays): Second Contact Pager: [phone]  ? ?SUBJECTIVE:  ?Chief Complaint: Shortness of Breath  ? ?History of Present Illness: Dale [name] is a  61 y.o. male with a pertinent PMH of HTN, HLD, CKD stage IIIb, and combined systolic and diastolic CHF, and polysubstance use who presents to [name] with [name]. ? ?[name] was in his usual state of health until 4 days ago when he began to notice his fluid "building up." Over the past 4 days he has been progressively dyspneic, and is unable to sleep because he cannot lie flat in his bed.  He denies any headaches, nausea, vomiting, chest pain, constipation, or diarrhea.  He states that he did have some abdominal pain, but this has been alleviated since having a bowel movement.   ? ?He denies any dietary discretion, but does state that he has not had his medications for a month. He states that he has 3 pills left of his Demadex, and is out of the rest of his medications. He is unsure of his dry weight. ? ?ED Course:  ?Found volume overloaded on physical examination with bilateral lower extremity swelling, basilar crackles, and cardiomegaly with an opacity noted on his CXR. He needed supplemental O2 and is on His BNP was >4,500, he was hypokalemic at 3.1, AKI on CKD stage II with a sCr of 2.00, Troponins flat at 34->35.  Given supplemental oxygen requirement, ? ?Medications: ?No current facility-administered medications on file prior to encounter.  ? ?Current Outpatient Medications on File Prior to Encounter  ?Medication Sig  Dispense Refill  ? aspirin EC 81 MG EC tablet Take 1 tablet (81 mg total) by mouth daily. Swallow whole. 30 tablet 11  ? atorvastatin (LIPITOR) 40 MG tablet Take 1 tablet (40 mg total) by mouth daily. 30 tablet 3  ? acetaminophen (TYLENOL) 325 MG tablet Take 2 tablets (650 mg total) by mouth every 4 (four) hours as needed for headache or mild pain.    ? carvedilol (COREG) 3.125 MG tablet Take 1 tablet (3.125 mg total) by mouth 2 (two) times daily with a meal. (Patient not taking: Reported on 02/19/2022) 60 tablet 3  ? dapagliflozin propanediol (FARXIGA) 10 MG TABS tablet Take 1 tablet (10 mg total) by mouth daily. 30 tablet 3  ? diclofenac sodium (VOLTAREN) 1 % GEL Apply 2 g topically 4 (four) times daily as needed (pain).    ? hydrOXYzine (ATARAX/VISTARIL) 25 MG tablet Take 1 tablet (25 mg total) by mouth 2 (two) times daily as needed for anxiety and take 2 tablets (50 mg total) by mouth at bedtime as needed (insomnia). 90 tablet 0  ? sacubitril-valsartan (ENTRESTO) 49-51 MG Take 1 tablet by mouth 2 (two) times daily. (Patient not taking: Reported on 02/19/2022) 60 tablet 6  ? spironolactone (ALDACTONE) 25 MG tablet Take 1 tablet (25 mg total) by mouth daily. 30 tablet 3  ? torsemide (DEMADEX) 20 MG tablet Take 3 tablets (60  mg total) by mouth 2 (two) times daily. Please cancel all previous orders for current medication. Change in dosage or pill size. 180 tablet 3  ? ? ?Past Medical History: ?Past Medical History:  ?Diagnosis Date  ? Alcohol abuse   ? CAD (coronary artery disease)   ? a. 05/2019 Cath: LM nl, LAD 15p, 79m, LCX large/nl, OM2 30, OM3 20, RCA 100 CTO w/ bridging L->R collats to RPDA.  ? Chronic combined systolic and diastolic CHF (congestive heart failure) ()   ? a. 09/27/18 Echo: EF 20-25%, grade 2 DD; b. 03/2019 Echo: EF 20-25%; c. 05/2021 Echo: EF <10%, glob HK. GrII DD. Sev red RV fxn. Sev BAE. Mild MR. Mild-mod TR.  ? CKD (chronic kidney disease), stage II   ? Cocaine abuse ([place])   ? Hyperlipidemia  LDL goal <70   ? Hypertension   ? Ischemic cardiomyopathy   ? a. 09/27/18 Echo: EF 20-25%, grade 2 DD; b. 03/2019 Echo: EF 20-25%; c. 05/2019 Cath: RCA 100 CTA, otw nonobs dzs; d. 05/2021 Echo: EF <10%, glob HK. GrII DD. Sev red RV fxn.  ? Morbid obesity ([place])   ? Noncompliance   ? Normocytic anemia   ? ? ?Social:  ?Lives - [address] ?[place],  [place] [address],  ?Occupation - disability ?PCP - Nahser, Wonda Cheng ?Substance use: ?Nonprescription/Illicit - Past cocaine. Currently smokes marijuana  ?ETOH -  1 ETOH containing beverage once every two weeks ?Tobacco - denied ? ?Family History: ?Family History  ?Problem Relation Age of Onset  ? Hypertension Maternal Grandmother   ? ? ?Allergies: ?Allergies as of 02/19/2022 - Review Complete 02/19/2022  ?Allergen Reaction Noted  ? Hydrocodone Itching 03/11/2016  ? ? ?Review of Systems: ?A complete ROS was negative except as per HPI.  ? ?OBJECTIVE:  ?Physical Exam: ?Blood pressure (!) 123/103, pulse 94, temperature (!) 97.4 ?F (36.3 ?C), temperature source Oral, resp. rate (!) 26, SpO2 100 %. ?Physical Exam ?Constitutional:   ?   General: He is not in acute distress. ?   Appearance: He is well-developed.  ?   Comments: Resting comfortably in bed, answers questions in complete sentences, NAD  ?Cardiovascular:  ?   Rate and Rhythm: Normal rate and regular rhythm.  ?   Pulses: Normal pulses.  ?   Heart sounds: Normal heart sounds. No murmur heard. ?  No friction rub. No gallop.  ?Pulmonary:  ?   Effort: Pulmonary effort is normal. No respiratory distress.  ?   Breath sounds: No stridor. No wheezing.  ?   Comments: Saturating at 97% on 2 L supplemental O2, bibasilar crackles appreciated on the lower lung fields. ?Abdominal:  ?   General: Bowel sounds are normal. There is no distension.  ?   Tenderness: There is no abdominal tenderness. There is no guarding.  ?Musculoskeletal:     ?   General: No tenderness.  ?   Right lower leg: Edema present.  ?   Left lower leg: Edema  present.  ?   Comments: 2+ bilateral lower extremity edema up to thighs.  ?Skin: ?   General: Skin is warm.  ?   Findings: No erythema or rash.  ?Neurological:  ?   Mental Status: He is alert and oriented to person, place, and time.  ?Psychiatric:     ?   Mood and Affect: Mood normal.  ? ? ?Pertinent Labs: ?CBC ?   ?Component Value Date/Time  ? WBC 8.3 02/19/2022 0319  ? RBC  3.90 (L) 02/19/2022 0319  ? HGB 12.4 (L) 02/19/2022 0319  ? HCT 37.1 (L) 02/19/2022 0319  ? PLT 245 02/19/2022 0319  ? MCV 95.1 02/19/2022 0319  ? MCH 31.8 02/19/2022 0319  ? MCHC 33.4 02/19/2022 0319  ? RDW 14.8 02/19/2022 0319  ? LYMPHSABS 0.6 (L) 02/19/2022 0319  ? MONOABS 0.6 02/19/2022 0319  ? EOSABS 0.0 02/19/2022 0319  ? BASOSABS 0.0 02/19/2022 0319  ?  ? ?CMP  ?   ?Component Value Date/Time  ? NA 138 02/19/2022 0319  ? NA 140 11/16/2020 1508  ? K 3.1 (L) 02/19/2022 0319  ? CL 103 02/19/2022 0319  ? CO2 22 02/19/2022 0319  ? GLUCOSE 168 (H) 02/19/2022 0319  ? BUN 46 (H) 02/19/2022 0319  ? BUN 30 (H) 11/16/2020 1508  ? CREATININE 2.00 (H) 02/19/2022 0319  ? CREATININE 1.23 03/11/2016 0919  ? CALCIUM 9.0 02/19/2022 0319  ? PROT 7.6 02/19/2022 0319  ? ALBUMIN 3.7 02/19/2022 0319  ? AST 24 02/19/2022 0319  ? ALT 18 02/19/2022 0319  ? ALKPHOS 88 02/19/2022 0319  ? BILITOT 1.8 (H) 02/19/2022 0319  ? GFRNONAA 38 (L) 02/19/2022 0319  ? GFRNONAA 67 03/11/2016 0919  ? GFRAA 58 (L) 11/16/2020 1508  ? GFRAA 78 03/11/2016 0919  ? ? ?Pertinent Imaging: ?DG Chest 2 View ? ?Result Date: 02/19/2022 ?CLINICAL DATA:  Dyspnea EXAM: CHEST - 2 VIEW COMPARISON:  06/13/2021 FINDINGS: There is moderate cardiomegaly, unchanged. Mild right basilar opacity. No pneumothorax or sizable pleural effusion. IMPRESSION: Moderate cardiomegaly without overt pulmonary edema. Mild right basilar opacity could indicate early infection. Electronically Signed   By: Ulyses Jarred M.D.   On: 02/19/2022 03:38   ? ?EKG: Sinus rhythm with occasional PVCs ? ?ASSESSMENT & PLAN:  ?Patient  Summary: ?Mr. Schlangen is a 61 year old male with a past medical history of HFrEF (EF<10), CKD stage IIIb, polysubstance use, hypertension, and hyperlipidemia who presented to the [facility] ED for dyspnea and

## 2022-02-19 NOTE — ED Notes (Signed)
MD messaged

## 2022-02-19 NOTE — Progress Notes (Signed)
Pt arrived from ED unable to get temp on pt. Attempted under the tongue, armpit Bil and rectal. Pt warm to the touch. States he feels okay just very SOB. 2L [place] placed. Rapid Nurse. On call MD paged Will continue to monitor. Will continue POC. ?

## 2022-02-19 NOTE — Progress Notes (Signed)
PICC consult: Benefits and risks reviewed with patient. He declined PICC insertion. Denied any questions regarding the procedure. ?

## 2022-02-19 NOTE — Progress Notes (Signed)
Echocardiogram ?2D Echocardiogram has been performed. ? ?[name] ?02/19/2022, 2:51 PM ?

## 2022-02-19 NOTE — ED Notes (Signed)
No reply from hospitalist, cardio paged ?

## 2022-02-19 NOTE — Consult Note (Addendum)
?  ?Advanced Heart Failure Team Consult Note ? ? ?Primary Physician: Thayer Headings, MD ?PCP-Cardiologist:  Mertie Moores, MD ?Heart Failure MD: Dr Aundra Dubin ? ?Reason for Consultation: A/C HFrEF  ? ?HPI:   ? ?Dale [name] is seen today for evaluation of heart failure at the request of Dr hatcher.  ? ?Dale [name] is a 61 year old with h/o polysubstance abuse, HTN, CAD, CKD Stage IIIb, and combined HFrEF.  ? ?Multiple HF admissions dating back to 2019.  2 CHF admissions 2021. 6 admissions in 2022.  This is the 3rd HF admit in 2023.  ? ?Followed in the HF clinic and was last seen 12/11/2021. Volume overloaded. Entresto and torsemide increased. ? ?Ran out of his medications 30 days ago. Drinking 3-6 32 ounces of alcohol per day. He last used cocaine 9 months ago. He has been couch surfing or living in a motel.  He has no car. Has difficulty getting to appointments. Wants to go to rehab.  ?  ?Presented to [facility] with increased lower extremity edmea and shortness of breaht. CXR with possible early infection right basilar opacity. Hgb 12.4, BNP > 4500, HS Trop 34>35. UDS + tetrahydrocannabinol.  Given IV lasix with poor response.  ? ?Cardiac Test ? ?Echo (11/19) EF 20%, grade II DD. Echo (5/20) EF 20 to 25% RV norma ?Echo (7/22)  EF < 10%, severe LV dysfunction with global HK, grade II DD, RV severely reduced, mild to moderate TR ? ? RHC (7/20) RA (mean) 17 mmHg, PA 36/20 (25), PCWP (mean) 24 mmHg, CO/CI 7.7/3.4 by Fick.  ?LHC (7/20): CTO of RCA treated medically ?Review of Systems: [y] = yes, [ ]  = no  ? ?General: Weight gain [ ] ; Weight loss [ ] ; Anorexia [ ] ; Fatigue [ ] ; Fever [ ] ; Chills [ ] ; Weakness [ ]   ?Cardiac: Chest pain/pressure [ ] ; Resting SOB [ ] ; Exertional SOB [ Y]; Orthopnea [ Y]; Pedal Edema [ Y]; Palpitations [ ] ; Syncope [ ] ; Presyncope [ ] ; Paroxysmal nocturnal dyspnea[ ]   ?Pulmonary: Cough [ ] ; Wheezing[ ] ; Hemoptysis[ ] ; Sputum [ ] ; Snoring [ ]   ?GI: Vomiting[ ] ; Dysphagia[ ] ; Melena[ ] ;  Hematochezia [ ] ; Heartburn[ ] ; Abdominal pain [ ] ; Constipation [ ] ; Diarrhea [ ] ; BRBPR [ ]   ?GU: Hematuria[ ] ; Dysuria [ ] ; Nocturia[ ]   ?Vascular: Pain in legs with walking [ ] ; Pain in feet with lying flat [ ] ; Non-healing sores [ ] ; Stroke [ ] ; TIA [ ] ; Slurred speech [ ] ;  ?Neuro: Headaches[ ] ; Vertigo[ ] ; Seizures[ ] ; Paresthesias[ ] ;Blurred vision [ ] ; Diplopia [ ] ; Vision changes [ ]   ?Ortho/Skin: Arthritis [ ] ; Joint pain [ ] ; Muscle pain [ ] ; Joint swelling [ ] ; Back Pain [ Y]; Rash [ ]   ?Psych: Depression[ Y]; Anxiety[ ]   ?Heme: Bleeding problems [ ] ; Clotting disorders [ ] ; Anemia [ ]   ?Endocrine: Diabetes [ ] ; Thyroid dysfunction[ ]  ? ?Home Medications ?Prior to Admission medications   ?Medication Sig Start Date End Date Taking? Authorizing Provider  ?aspirin EC 81 MG EC tablet Take 1 tablet (81 mg total) by mouth daily. Swallow whole. 12/17/20  Yes Lorella Nimrod, MD  ?atorvastatin (LIPITOR) 40 MG tablet Take 1 tablet (40 mg total) by mouth daily. 08/15/21 12/11/22 Yes Larey Dresser, MD  ?acetaminophen (TYLENOL) 325 MG tablet Take 2 tablets (650 mg total) by mouth every 4 (four) hours as needed for headache or mild pain. 06/21/21   Florencia Reasons, MD  ?carvedilol (  COREG) 3.125 MG tablet Take 1 tablet (3.125 mg total) by mouth 2 (two) times daily with a meal. ?Patient not taking: Reported on 02/19/2022 08/15/21   Larey Dresser, MD  ?dapagliflozin propanediol (FARXIGA) 10 MG TABS tablet Take 1 tablet (10 mg total) by mouth daily. 08/15/21   Larey Dresser, MD  ?diclofenac sodium (VOLTAREN) 1 % GEL Apply 2 g topically 4 (four) times daily as needed (pain).    [provider]  ?hydrOXYzine (ATARAX/VISTARIL) 25 MG tablet Take 1 tablet (25 mg total) by mouth 2 (two) times daily as needed for anxiety and take 2 tablets (50 mg total) by mouth at bedtime as needed (insomnia). 06/21/21   Florencia Reasons, MD  ?sacubitril-valsartan (ENTRESTO) 49-51 MG Take 1 tablet by mouth 2 (two) times daily. ?Patient not taking:  Reported on 02/19/2022 12/11/21   Rafael Bihari, FNP  ?spironolactone (ALDACTONE) 25 MG tablet Take 1 tablet (25 mg total) by mouth daily. 08/15/21   Larey Dresser, MD  ?torsemide (DEMADEX) 20 MG tablet Take 3 tablets (60 mg total) by mouth 2 (two) times daily. Please cancel all previous orders for current medication. Change in dosage or pill size. 12/11/21   Rafael Bihari, FNP  ? ? ?Past Medical History: ?Past Medical History:  ?Diagnosis Date  ? Alcohol abuse   ? CAD (coronary artery disease)   ? a. 05/2019 Cath: LM nl, LAD 15p, 97m, LCX large/nl, OM2 30, OM3 20, RCA 100 CTO w/ bridging L->R collats to RPDA.  ? Chronic combined systolic and diastolic CHF (congestive heart failure) ([place])   ? a. 09/27/18 Echo: EF 20-25%, grade 2 DD; b. 03/2019 Echo: EF 20-25%; c. 05/2021 Echo: EF <10%, glob HK. GrII DD. Sev red RV fxn. Sev BAE. Mild Dale. Mild-mod TR.  ? CKD (chronic kidney disease), stage II   ? Cocaine abuse ([place])   ? Hyperlipidemia LDL goal <70   ? Hypertension   ? Ischemic cardiomyopathy   ? a. 09/27/18 Echo: EF 20-25%, grade 2 DD; b. 03/2019 Echo: EF 20-25%; c. 05/2019 Cath: RCA 100 CTA, otw nonobs dzs; d. 05/2021 Echo: EF <10%, glob HK. GrII DD. Sev red RV fxn.  ? Morbid obesity ([place])   ? Noncompliance   ? Normocytic anemia   ? ? ?Past Surgical History: ?Past Surgical History:  ?Procedure Laterality Date  ? RIGHT/LEFT HEART CATH AND CORONARY ANGIOGRAPHY N/A 05/10/2019  ? Procedure: RIGHT/LEFT HEART CATH AND CORONARY ANGIOGRAPHY;  Surgeon: Nelva Bush, MD;  Location: [facility];  Service: Cardiovascular;  Laterality: N/A;  ? ? ?Family History: ?Family History  ?Problem Relation Age of Onset  ? Hypertension Maternal Grandmother   ? ? ?Social History: ?Social History  ? ?Socioeconomic History  ? Marital status: Single  ?  Spouse name: none  ? Number of children: Not on file  ? Years of education: Not on file  ? Highest education level: Not on file  ?Occupational History  ? Occupation: Disabled   ?Tobacco Use  ? Smoking status: Never  ? Smokeless tobacco: Never  ?Vaping Use  ? Vaping Use: Never used  ?Substance and Sexual Activity  ? Alcohol use: Yes  ?  Comment: 1 quarts a week  ? Drug use: Not Currently  ?  Frequency: 3.0 times per week  ?  Types: Marijuana, Cocaine  ? Sexual activity: Not Currently  ?Other Topics Concern  ? Not on file  ?Social History Narrative  ? Lives w/ mother in [place] but recently  visiting w/ dtr in [place].  ? ?Social Determinants of Health  ? ?Financial Resource Strain: High Risk  ? Difficulty of Paying Living Expenses: Hard  ?Food Insecurity: Food Insecurity Present  ? Worried About Charity fundraiser in the Last Year: Sometimes true  ? Ran Out of Food in the Last Year: Sometimes true  ?Transportation Needs: Unmet Transportation Needs  ? Lack of Transportation (Medical): Yes  ? Lack of Transportation (Non-Medical): Yes  ?Physical Activity: Not on file  ?Stress: Not on file  ?Social Connections: Not on file  ? ? ?Allergies:  ?Allergies  ?Allergen Reactions  ? Hydrocodone Itching  ? ? ?Objective:   ? ?Vital Signs:   ?Temp:  [97.4 ?F (36.3 ?C)-98.2 ?F (36.8 ?C)] 97.4 ?F (36.3 ?C) (04/18 0754) ?Pulse Rate:  [93-107] 94 (04/18 1200) ?Resp:  [18-26] 26 (04/18 1200) ?BP: (111-148)/(58-109) 123/103 (04/18 1200) ?SpO2:  [94 %-100 %] 100 % (04/18 1200) ?  ? ?Weight change: ?There were no vitals filed for this visit. ? ?Intake/Output:  ?No intake or output data in the 24 hours ending 02/19/22 1318  ? ? ?Physical Exam  ?  ?General:   No resp difficulty ?HEENT: normal ?Neck: supple. JVP to jaw . Carotids 2+ bilat; no bruits. No lymphadenopathy or thyromegaly appreciated. ?Cor: PMI nondisplaced. Regular rate & rhythm. No rubs, gallops or murmurs. ?Lungs: clear ?Abdomen: soft, nontender, nondistended. No hepatosplenomegaly. No bruits or masses. Good bowel sounds. ?Extremities: no cyanosis, clubbing, rash, R and LLE 2+ edema ?Neuro: alert & orientedx3, cranial nerves grossly intact. moves all 4  extremities w/o difficulty. Affect pleasant ? ? ?Telemetry  ? ?Not  on the monitor ? ?EKG  ?  ?SR 99 bpm  ? ?Labs  ? ?Basic Metabolic Panel: ?Recent Labs  ?Lab 02/19/22 ?0319  ?NA 138  ?K 3.1*  ?CL 103  ?CO2 22  ?GLUC

## 2022-02-19 NOTE — ED Provider Notes (Signed)
?[facility] ?Provider Note ? ? ?CSN: [id number] ?Arrival date & time: 02/19/22  0255 ? ?  ? ?History ? ?Chief Complaint  ?Patient presents with  ? Shortness of Breath  ? Leg Swelling  ? ? ?Dale [name] is a 61 y.o. male. ? ?Patient with hx chf, c/o increased sob and bil leg swelling in past week. States ran out of meds 3-4 days ago. Symptoms acute onset, moderate, persistent, slowly worsening. No chest pain or discomfort. No increased/new cough. No fever or chills. No pleuritic pain. No abd pain or nvd. Also c/o right middle finger, distal phalanx, pain/swelling in past couple days - denies injury.  ? ?The history is provided by the patient, the EMS personnel and medical records.  ?Chest Pain ?Associated symptoms: shortness of breath   ?Associated symptoms: no abdominal pain, no back pain, no fever, no headache and no vomiting   ?Shortness of Breath ?Associated symptoms: chest pain   ?Associated symptoms: no abdominal pain, no fever, no headaches, no neck pain, no rash, no sore throat and no vomiting   ? ?  ? ?Home Medications ?Prior to Admission medications   ?Medication Sig Start Date End Date Taking? Authorizing Provider  ?acetaminophen (TYLENOL) 325 MG tablet Take 2 tablets (650 mg total) by mouth every 4 (four) hours as needed for headache or mild pain. 06/21/21   Florencia Reasons, MD  ?aspirin EC 81 MG EC tablet Take 1 tablet (81 mg total) by mouth daily. Swallow whole. 12/17/20   Lorella Nimrod, MD  ?atorvastatin (LIPITOR) 40 MG tablet Take 1 tablet (40 mg total) by mouth daily. 08/15/21 12/11/22  Larey Dresser, MD  ?carvedilol (COREG) 3.125 MG tablet Take 1 tablet (3.125 mg total) by mouth 2 (two) times daily with a meal. 08/15/21   Larey Dresser, MD  ?dapagliflozin propanediol (FARXIGA) 10 MG TABS tablet Take 1 tablet (10 mg total) by mouth daily. 08/15/21   Larey Dresser, MD  ?diclofenac sodium (VOLTAREN) 1 % GEL Apply 2 g topically 4 (four) times daily as needed  (pain).    [provider]  ?hydrOXYzine (ATARAX/VISTARIL) 25 MG tablet Take 1 tablet (25 mg total) by mouth 2 (two) times daily as needed for anxiety and take 2 tablets (50 mg total) by mouth at bedtime as needed (insomnia). 06/21/21   Florencia Reasons, MD  ?sacubitril-valsartan (ENTRESTO) 49-51 MG Take 1 tablet by mouth 2 (two) times daily. 12/11/21   Rafael Bihari, FNP  ?spironolactone (ALDACTONE) 25 MG tablet Take 1 tablet (25 mg total) by mouth daily. 08/15/21   Larey Dresser, MD  ?torsemide (DEMADEX) 20 MG tablet Take 3 tablets (60 mg total) by mouth 2 (two) times daily. Please cancel all previous orders for current medication. Change in dosage or pill size. 12/11/21   Rafael Bihari, FNP  ?   ? ?Allergies    ?Hydrocodone   ? ?Review of Systems   ?Review of Systems  ?Constitutional:  Negative for chills and fever.  ?HENT:  Negative for sore throat.   ?Eyes:  Negative for redness.  ?Respiratory:  Positive for shortness of breath.   ?Cardiovascular:  Positive for chest pain and leg swelling.  ?Gastrointestinal:  Negative for abdominal pain and vomiting.  ?Genitourinary:  Negative for flank pain.  ?Musculoskeletal:  Negative for back pain and neck pain.  ?Skin:  Negative for rash.  ?Neurological:  Negative for headaches.  ?Hematological:  Does not bruise/bleed easily.  ?Psychiatric/Behavioral:  Negative for  confusion.   ? ?Physical Exam ?Updated Vital Signs ?BP (!) 148/103 (BP Location: Right Arm)   Pulse 93   Temp (!) 97.4 ?F (36.3 ?C) (Oral)   Resp 19   SpO2 100%  ?Physical Exam ?Vitals and nursing note reviewed.  ?Constitutional:   ?   Appearance: Normal appearance. He is well-developed.  ?HENT:  ?   Head: Atraumatic.  ?   Nose: Nose normal.  ?   Mouth/Throat:  ?   Mouth: Mucous membranes are moist.  ?Eyes:  ?   General: No scleral icterus. ?   Conjunctiva/sclera: Conjunctivae normal.  ?Neck:  ?   Trachea: No tracheal deviation.  ?Cardiovascular:  ?   Rate and Rhythm: Normal rate and regular  rhythm.  ?   Pulses: Normal pulses.  ?   Heart sounds: Normal heart sounds. No murmur heard. ?  No friction rub. No gallop.  ?Pulmonary:  ?   Effort: No accessory muscle usage or respiratory distress.  ?   Comments: Basilar rales ?Abdominal:  ?   General: Bowel sounds are normal. There is no distension.  ?   Palpations: Abdomen is soft.  ?   Tenderness: There is no abdominal tenderness.  ?Genitourinary: ?   Comments: No cva tenderness. ?Musculoskeletal:     ?   General: Swelling present.  ?   Cervical back: Normal range of motion and neck supple. No rigidity.  ?   Right lower leg: Edema present.  ?   Left lower leg: Edema present.  ?   Comments: Symmetric bil leg edema, mod/sev, no calf pain or tenderness. +paronychia right middle finger.   ?Skin: ?   General: Skin is warm and dry.  ?   Findings: No rash.  ?Neurological:  ?   Mental Status: He is alert.  ?   Comments: Alert, speech clear.   ?Psychiatric:     ?   Mood and Affect: Mood normal.  ? ? ?ED Results / Procedures / Treatments   ?Labs ?(all labs ordered are listed, but only abnormal results are displayed) ?Results for orders placed or performed during the hospital encounter of 02/19/22  ?Comprehensive metabolic panel  ?Result Value Ref Range  ? Sodium 138 135 - 145 mmol/L  ? Potassium 3.1 (L) 3.5 - 5.1 mmol/L  ? Chloride 103 98 - 111 mmol/L  ? CO2 22 22 - 32 mmol/L  ? Glucose, Bld 168 (H) 70 - 99 mg/dL  ? BUN 46 (H) 6 - 20 mg/dL  ? Creatinine, Ser 2.00 (H) 0.61 - 1.24 mg/dL  ? Calcium 9.0 8.9 - 10.3 mg/dL  ? Total Protein 7.6 6.5 - 8.1 g/dL  ? Albumin 3.7 3.5 - 5.0 g/dL  ? AST 24 15 - 41 U/L  ? ALT 18 0 - 44 U/L  ? Alkaline Phosphatase 88 38 - 126 U/L  ? Total Bilirubin 1.8 (H) 0.3 - 1.2 mg/dL  ? GFR, Estimated 38 (L) >60 mL/min  ? Anion gap 13 5 - 15  ?CBC with Differential  ?Result Value Ref Range  ? WBC 8.3 4.0 - 10.5 K/uL  ? RBC 3.90 (L) 4.22 - 5.81 MIL/uL  ? Hemoglobin 12.4 (L) 13.0 - 17.0 g/dL  ? HCT 37.1 (L) 39.0 - 52.0 %  ? MCV 95.1 80.0 - 100.0 fL  ?  MCH 31.8 26.0 - 34.0 pg  ? MCHC 33.4 30.0 - 36.0 g/dL  ? RDW 14.8 11.5 - 15.5 %  ? Platelets 245 150 - 400 K/uL  ?  nRBC 0.2 0.0 - 0.2 %  ? Neutrophils Relative % 82 %  ? Neutro Abs 6.9 1.7 - 7.7 K/uL  ? Lymphocytes Relative 8 %  ? Lymphs Abs 0.6 (L) 0.7 - 4.0 K/uL  ? Monocytes Relative 7 %  ? Monocytes Absolute 0.6 0.1 - 1.0 K/uL  ? Eosinophils Relative 0 %  ? Eosinophils Absolute 0.0 0.0 - 0.5 K/uL  ? Basophils Relative 1 %  ? Basophils Absolute 0.0 0.0 - 0.1 K/uL  ? Immature Granulocytes 2 %  ? Abs Immature Granulocytes 0.12 (H) 0.00 - 0.07 K/uL  ?Brain natriuretic peptide  ?Result Value Ref Range  ? B Natriuretic Peptide >4,500.0 (H) 0.0 - 100.0 pg/mL  ?Magnesium  ?Result Value Ref Range  ? Magnesium 2.2 1.7 - 2.4 mg/dL  ?Troponin I (High Sensitivity)  ?Result Value Ref Range  ? Troponin I (High Sensitivity) 34 (H) <18 ng/L  ?Troponin I (High Sensitivity)  ?Result Value Ref Range  ? Troponin I (High Sensitivity) 35 (H) <18 ng/L  ? ?DG Chest 2 View ? ?Result Date: 02/19/2022 ?CLINICAL DATA:  Dyspnea EXAM: CHEST - 2 VIEW COMPARISON:  06/13/2021 FINDINGS: There is moderate cardiomegaly, unchanged. Mild right basilar opacity. No pneumothorax or sizable pleural effusion. IMPRESSION: Moderate cardiomegaly without overt pulmonary edema. Mild right basilar opacity could indicate early infection. Electronically Signed   By: Ulyses Jarred M.D.   On: 02/19/2022 03:38   ? ? ? ?EKG ?EKG Interpretation ? ?Date/Time:  Tuesday February 19 2022 03:03:02 EDT ?Ventricular Rate:  99 ?PR Interval:  204 ?QRS Duration: 110 ?QT Interval:  378 ?QTC Calculation: 485 ?R Axis:   161 ?Text Interpretation: Sinus rhythm with occasional and consecutive Premature ventricular complexes Right axis deviation Non-specific ST-t changes Confirmed by Lajean Saver [phone]) on 02/19/2022 7:54:44 AM ? ?Radiology ?DG Chest 2 View ? ?Result Date: 02/19/2022 ?CLINICAL DATA:  Dyspnea EXAM: CHEST - 2 VIEW COMPARISON:  06/13/2021 FINDINGS: There is moderate  cardiomegaly, unchanged. Mild right basilar opacity. No pneumothorax or sizable pleural effusion. IMPRESSION: Moderate cardiomegaly without overt pulmonary edema. Mild right basilar opacity could indicate early infection

## 2022-02-19 NOTE — ED Notes (Signed)
Cardiology called back, informed of orthopnea. Plan is picc placement and possibly infusion of milrinone. Pt. Able to eat in a 90 degree position comfortably ?

## 2022-02-19 NOTE — ED Notes (Signed)
Sitting on towards end of bed, refuses monitoring equipment and vitals. ?

## 2022-02-19 NOTE — ED Triage Notes (Signed)
Pt comes from home via [facility] for SOB that has been going on with lower leg swelling.  ?

## 2022-02-19 NOTE — ED Provider Triage Note (Signed)
Emergency Medicine Provider Triage Evaluation Note ? ?Dale [name] , a 61 y.o. male  was evaluated in triage.  Pt complains of dyspnea x 3-4 days. Constant, associated LE edema. Reports out of his diuretic x 3 days. Also has had N/V/D. ? ?Review of Systems  ?Positive: Dyspnea, leg swelling, N/V/D ?Negative: Fever, hemoptysis, abdominal pain ? ?Physical Exam  ?BP (!) 129/99 (BP Location: Right Arm)   Pulse 98   Temp 98.2 ?F (36.8 ?C) (Oral)   Resp 18   SpO2 100%  ?Gen:   Awake, no distress   ?Resp:  Normal effort  ?MSK:   Moves extremities without difficulty  ?Other:  Pitting edema to bilateral lower legs.  ? ?Medical Decision Making  ?Medically screening exam initiated at 3:02 AM.  Appropriate orders placed.  Camauri Craton was informed that the remainder of the evaluation will be completed by another provider, this initial triage assessment does not replace that evaluation, and the importance of remaining in the ED until their evaluation is complete. ? ?Dyspnea ?  ?Cherly Anderson, PA-C ?02/19/22 0303 ? ?

## 2022-02-19 NOTE — ED Notes (Signed)
Unable to do bladder scan d/t shortness of breath in reclining position ?

## 2022-02-20 DIAGNOSIS — I5043 Acute on chronic combined systolic (congestive) and diastolic (congestive) heart failure: Secondary | ICD-10-CM | POA: Diagnosis not present

## 2022-02-20 DIAGNOSIS — E876 Hypokalemia: Secondary | ICD-10-CM

## 2022-02-20 LAB — BASIC METABOLIC PANEL
Anion gap: 8 (ref 5–15)
Anion gap: 9 (ref 5–15)
BUN: 46 mg/dL — ABNORMAL HIGH (ref 6–20)
BUN: 41 mg/dL — ABNORMAL HIGH (ref 6–20)
CO2: 27 mmol/L (ref 22–32)
CO2: 26 mmol/L (ref 22–32)
Calcium: 9 mg/dL (ref 8.9–10.3)
Calcium: 8.7 mg/dL — ABNORMAL LOW (ref 8.9–10.3)
Chloride: 104 mmol/L (ref 98–111)
Chloride: 104 mmol/L (ref 98–111)
Creatinine, Ser: 1.93 mg/dL — ABNORMAL HIGH (ref 0.61–1.24)
Creatinine, Ser: 1.79 mg/dL — ABNORMAL HIGH (ref 0.61–1.24)
GFR, Estimated: 40 mL/min — ABNORMAL LOW (ref >60)
GFR, Estimated: 43 mL/min — ABNORMAL LOW (ref >60)
Glucose, Bld: 126 mg/dL — ABNORMAL HIGH (ref 70–99)
Glucose, Bld: 117 mg/dL — ABNORMAL HIGH (ref 70–99)
Potassium: 3.7 mmol/L (ref 3.5–5.1)
Potassium: 3.6 mmol/L (ref 3.5–5.1)
Sodium: 139 mmol/L (ref 135–145)
Sodium: 139 mmol/L (ref 135–145)

## 2022-02-20 LAB — LIPID PANEL
Cholesterol: 127 mg/dL (ref 0–200)
HDL: 30 mg/dL — ABNORMAL LOW (ref >40)
LDL Cholesterol: 87 mg/dL (ref 0–99)
Total CHOL/HDL Ratio: 4.2 RATIO
Triglycerides: 49 mg/dL (ref <150)
VLDL: 10 mg/dL (ref 0–40)

## 2022-02-20 LAB — HEMOGLOBIN A1C
Hgb A1c MFr Bld: 5.3 % (ref 4.8–5.6)
Mean Plasma Glucose: 105.41 mg/dL

## 2022-02-20 LAB — HEPATITIS B CORE ANTIBODY, IGM: Hep B C IgM: NONREACTIVE

## 2022-02-20 LAB — CBC
HCT: 35.2 % — ABNORMAL LOW (ref 39.0–52.0)
Hemoglobin: 11.7 g/dL — ABNORMAL LOW (ref 13.0–17.0)
MCH: 31.5 pg (ref 26.0–34.0)
MCHC: 33.2 g/dL (ref 30.0–36.0)
MCV: 94.9 fL (ref 80.0–100.0)
Platelets: 229 10*3/uL (ref 150–400)
RBC: 3.71 MIL/uL — ABNORMAL LOW (ref 4.22–5.81)
RDW: 14.7 % (ref 11.5–15.5)
WBC: 6.5 10*3/uL (ref 4.0–10.5)
nRBC: 0 % (ref 0.0–0.2)

## 2022-02-20 LAB — MAGNESIUM: Magnesium: 2.2 mg/dL (ref 1.7–2.4)

## 2022-02-20 LAB — HEPATITIS B SURFACE ANTIGEN: Hepatitis B Surface Ag: NONREACTIVE

## 2022-02-20 LAB — HEPATITIS B SURFACE ANTIBODY,QUALITATIVE: Hep B S Ab: REACTIVE — AB

## 2022-02-20 LAB — HEPATITIS C ANTIBODY: HCV Ab: NONREACTIVE

## 2022-02-20 LAB — TSH: TSH: 0.998 u[IU]/mL (ref 0.350–4.500)

## 2022-02-20 LAB — HIV ANTIBODY (ROUTINE TESTING W REFLEX): HIV Screen 4th Generation wRfx: NONREACTIVE

## 2022-02-20 MED ORDER — METOLAZONE 2.5 MG PO TABS
2.5000 mg | ORAL_TABLET | Freq: Once | ORAL | Status: AC
  Administered 2022-02-20: 2.5 mg via ORAL
  Filled 2022-02-20: qty 1

## 2022-02-20 MED ORDER — RIVAROXABAN 10 MG PO TABS
10.0000 mg | ORAL_TABLET | Freq: Every day | ORAL | Status: DC
  Administered 2022-02-20: 10 mg via ORAL
  Filled 2022-02-20 (×2): qty 1

## 2022-02-20 NOTE — Progress Notes (Signed)
?   02/20/22 1030  ?Clinical Encounter Type  ?Visited With Patient not available  ?Visit Type Initial;Spiritual support  ?Referral From Nurse  ?Consult/Referral To Chaplain  ? ?Chaplain responded to a spiritual consult request for prayer.  ?Patient, Dale [name] was sleeping peacefully when I went into the room.  ? ?Valerie Roys  ?Chaplain  ?[facility]  ?[phone] ? ? ?

## 2022-02-20 NOTE — Progress Notes (Addendum)
? ?Subjective: Patient declined PICC line placement. ? ?This a.m., patient reports feeling better in terms of breathing, decreased swelling, and increased urinary output.  His appetite is intact.  He denies chest pain, headache, dizziness, and is voiding appropriately. ? ?He denies acute concerns or complaints today. ? ?Objective: ? ?Vital signs in last 24 hours: ?Vitals:  ? 02/20/22 0041 02/20/22 0342 02/20/22 0524 02/20/22 0740  ?BP: 115/81 (!) 111/97 106/70 112/82  ?Pulse: 94 95  100  ?Resp: 20 20  18   ?Temp: 98.6 ?F (37 ?C) 98.6 ?F (37 ?C)  98.5 ?F (36.9 ?C)  ?TempSrc:  Oral  Oral  ?SpO2: 99% 100%  100%  ?Weight:  116.4 kg    ?Height:      ? ?Physical Exam: ?General: Middle-aged male lying comfortably in bed with [place] in place, in NAD ?HENT: normocephalic, atraumatic, external nares and ears appear normal ?EYES: conjunctiva non-erythematous, no scleral icterus ?CV: regular rate, normal rhythm, no murmurs, rubs, gallops.  3+ pitting edema bilateral lower extremities to just below the knees. ?Pulmonary: normal work of breathing on 2L [place], lungs clear to auscultation bilaterally.  Upon leaving the room, patient removed nasal cannula, 100% on room air ?Abdominal: non-distended, soft, non-tender to palpation, normal BS all 4 quadrants ?Skin: Warm and dry ?Neurological: Awake, alert and oriented x3. No focal deficits of CNII-XII. ?Psych: normal affect and behavior.  Patient became tearful when discussing death of his grandmother and the effect it has had on his alcohol use. ? ?Assessment/Plan: ?Kohlman Bianca is a 61 year old male with a history of HFrEF with LVEF <10% and polysubstance use disorder who presents with 4 days of worsening dyspnea and edema after 1 month of medication noncompliance found to have acute exacerbation of CHF. ? ?Principal Problem: ?  Acute exacerbation of CHF (congestive heart failure) (HCC) ? ?#Acute on Chronic HFrEF Exacerbation in the Setting of Medication  Noncompliance ?#Hypertension ?#CAD ?Patient with 4 days of dyspnea in the setting of running out of his medications for the past month (multiple hospitalizations over the past 2 years secondary to medication noncompliance). His last echocardiogram was completed on 05/12/2021, and showed ejection fraction <10 with grade 2 diastolic dysfunction, severe left and right atrial dilatation. He does follow with Dr. Melburn Popper and the heart failure clinic; he has been lost to follow-up since 12/2021.  Repeat echo with LVEF 10 to 15% but largely unchanged from prior.  CHF team following, appreciate recs. ?-Continue Lasix gtt. and milrinone infusion IV per cardiology recs ?- Strict I&Os ?- Daily Weights ?- Trend BMP ?- Continue Aldactone 25 mg daily ?- Continue Farxiga 10 mg daily ?- Continue aspirin 81 mg ?- Continue atorvastatin 40 mg ?-Continue hydralazine 12.5 mg every 8 hours ?- Continue Imdur 15 mg daily ?- Continue potassium supplementation 40 mEq twice daily in light of continuous diuresis. ?- Hold Coreg in the setting of acute HFrEF exacerbation ?- Holding Entresto in the setting of kidney function ? ?#CKD Stage IIIb ?Creatinine 1.93 this a.m., which is likely secondary to his volume overloaded state.  Expect to improve with diuresis.  Baseline appears to be ~1.6. ?- Trend BMP ?- Continue Farxiga 10 mg daily ?- Avoid nephrotoxic agents ?  ?#History of Polysubstance Abuse ?#Prior Cocaine Abuse ?#Current alcohol Abuse ?#Current Marijuana Use ?Mr. Emanuelson has a history of cocaine and marijuana use.  He has been clean from cocaine for the past 9 months.  In the past he does not qualify for an ICD given his cocaine usage and  medication noncompliance. He does endorse smoking marijuana.  He has cut back on his alcohol significantly, and only drinks 1 alcoholic beverage every 2 weeks. His UDS without cocaine and positive for THC. ?-TOC consult placed for rehabilitation and housing resources ?- Chaplain consult placed to discuss  patient's struggle with substance use in light of the death of his grandmother. ? ?#Hyperlipidemia: ?LDL 87, total cholesterol 127. ?- Continue home atorvastatin 40 mg ? ? ?Best practices: ?Diet: Heart healthy diet with 1200 mL fluid restriction ?DVT prophylaxis: Xarelto 10 mg daily ?Electrolytes: Replete to Mg > 2 and K >4.0 ? ?Prior to Admission Living Arrangement: Unhomed ?Anticipated Discharge Location: Pending ?Barriers to Discharge: Clinical improvement ?Dispo: Anticipated discharge in approximately 2-5 day(s).  ? ?Rosezetta Schlatter, MD ?02/20/2022, 8:59 AM ?Pager: [phone] ?After 5pm on weekdays and 1pm on weekends: On Call pager [phone]  ?

## 2022-02-20 NOTE — Progress Notes (Signed)
Orthopedic Tech Progress Note ?Patient Details:  ?Dale [name] ?February 09, 1961 ?[id number] ? ?Ortho Devices ?Type of Ortho Device: Unna boot ?Ortho Device/Splint Location: BLE ?Ortho Device/Splint Interventions: Ordered, Application ?  ?Post Interventions ?Patient Tolerated: Well ? ?Dale [name] ?02/20/2022, 3:46 PM ? ?

## 2022-02-20 NOTE — Plan of Care (Signed)
?  Problem: Education: ?Goal: Ability to demonstrate management of disease process will improve ?Outcome: Progressing ?  ?

## 2022-02-20 NOTE — Progress Notes (Signed)
?Heart and Vascular Care Navigation ? ?02/20/2022 ? ?Dale [name] ?Mar 17, 1961 ?[id number] ? ?Reason for Referral: Patient requested to see CSW while in the hospital. ?  ?Engaged with patient face to face for follow up visit for Heart and Vascular Care Coordination. ?                                                                                                  ?Assessment:  Patient is well know to CSW from previous visits. Patient previously lived in a motel and reports that he is now living with a friend. Patient has been referred multiple times to inpatient substance facilities and states he continues to struggle with substance use. He states "I have not been using any cocaine but I am now drinking daily". CSW completed  the AUDIT and patient scored a 24 indicating a need for treatment.                              ? ?HRT/VAS Care Coordination   ? ? Patients Home Cardiology Office Heart Failure Clinic  ? Outpatient Care Team Social Worker  ? Social Worker Name: [name], [place] [phone]  ? Living arrangements for the past 2 months Apartment  ? Lives with: Friends  ? Patient Current Insurance Coverage Medicaid  ? Patient Has Concern With Paying Medical Bills No  ? Does Patient Have Prescription Coverage? Yes  ? Home Assistive Devices/Equipment Cane (specify quad or straight)  ? DME Agency [employer]  ? HH Agency NA  ? ?  ? ? ?Social History:                                                                             ?SDOH Screenings  ? ?Alcohol Screen: Medium Risk  ? Last Alcohol Screening Score (AUDIT): 24  ?Depression (PHQ2-9): Not on file  ?Financial Resource Strain: High Risk  ? Difficulty of Paying Living Expenses: Hard  ?Food Insecurity: Food Insecurity Present  ? Worried About Programme researcher, broadcasting/film/video in the Last Year: Sometimes true  ? Ran Out of Food in the Last Year: Sometimes true  ?Housing: High Risk  ? Last Housing Risk Score: 4  ?Physical Activity: Not on file  ?Social Connections: Not on  file  ?Stress: Not on file  ?Tobacco Use: Low Risk   ? Smoking Tobacco Use: Never  ? Smokeless Tobacco Use: Never  ? Passive Exposure: Not on file  ?Transportation Needs: Unmet Transportation Needs  ? Lack of Transportation (Medical): Yes  ? Lack of Transportation (Non-Medical): Yes  ? ? ?SDOH Interventions: ?Financial Resources:  Corporate treasurer Interventions: Artist ?N/a  ?Food Insecurity:  Food Insecurity Interventions: Assist with SNAP Application  ?Housing Insecurity:  Housing Interventions: Other (Comment) (provided shelter resources)  ?  Transportation:      ? ? ?Other Care Navigation Interventions:    ? ?Inpatient/Outpatient Substance Abuse Counseling/Rehab Options CSW provided Alcohol substance facility programs  ?Provided Pharmacy assistance resources  N/a  ?Patient expressed Mental Health concerns No.  ?Patient Referred to: N/a  ? ?Follow-up plan:  Patient will follow up with resources provided and options for alcohol rehab program. CSW will follow up with patient through the outpatient HF Clinic. [name], LCSW, CCSW-MCS [phone] ? ? ? ? ? ?

## 2022-02-20 NOTE — Progress Notes (Addendum)
? ? Advanced Heart Failure Rounding Note ? ?PCP-Cardiologist: Mertie Moores, MD  ?AHF: Dr. Aundra Dubin  ? ?Subjective:   ? ?Pt declined PICC line. On empiric milrinone 0.25 + lasix gtt at 12/hr  ? ?I/Os not accurate. He reports subjective improvement w/ good UOP. Breathing much improved. BP stable. Remains edematous.  ? ?SCr trending down slowly, 2.00>>1.93  ?K 3.7  ?Mg 2.2  ? ? ?Objective:   ?Weight Range: ?116.4 kg ?Body mass index is 35.79 kg/m?.  ? ?Vital Signs:   ?Temp:  [97.4 ?F (36.3 ?C)-98.6 ?F (37 ?C)] 98.6 ?F (37 ?C) (04/19 0342) ?Pulse Rate:  [94-107] 95 (04/19 0342) ?Resp:  [18-26] 20 (04/19 0342) ?BP: (106-132)/(58-109) 106/70 (04/19 0524) ?SpO2:  [94 %-100 %] 100 % (04/19 0342) ?Weight:  [115.8 kg-116.4 kg] 116.4 kg (04/19 0342) ?Last BM Date : 02/19/22 ? ?Weight change: ?Filed Weights  ? 02/19/22 1904 02/20/22 0342  ?Weight: 115.8 kg 116.4 kg  ? ? ?Intake/Output:  ? ?Intake/Output Summary (Last 24 hours) at 02/20/2022 0726 ?Last data filed at 02/20/2022 0329 ?Gross per 24 hour  ?Intake 480 ml  ?Output 505 ml  ?Net -25 ml  ?  ? ? ?Physical Exam  ?  ?General:  Well appearing, sitting up on side of bed. No resp difficulty ?HEENT: Normal ?Neck: Supple. JVP elevated to jaw . Carotids 2+ bilat; no bruits. No lymphadenopathy or thyromegaly appreciated. ?Cor: PMI nondisplaced. Regular rate & rhythm. No rubs, gallops or murmurs. ?Lungs: decreased BS at the bases bilaterally  ?Abdomen: Soft, nontender, nondistended. No hepatosplenomegaly. No bruits or masses. Good bowel sounds. ?Extremities: No cyanosis, clubbing, rash, 2+ b/l LE  ?Neuro: Alert & orientedx3, cranial nerves grossly intact. moves all 4 extremities w/o difficulty. Affect pleasant ? ? ?Telemetry  ? ?Sinus tach, low 100s. Brief NSVT 4 beats x 2  ? ?EKG  ?  ?No new EKG to review  ? ?Labs  ?  ?CBC ?Recent Labs  ?  02/19/22 ?[id number] 02/20/22 ?0436  ?WBC 8.3 6.5  ?NEUTROABS 6.9  --   ?HGB 12.4* 11.7*  ?HCT 37.1* 35.2*  ?MCV 95.1 94.9  ?PLT 245 229  ? ?Basic  Metabolic Panel ?Recent Labs  ?  02/19/22 ?[id number] 02/20/22 ?0436  ?NA 138 139  ?K 3.1* 3.7  ?CL 103 104  ?CO2 22 27  ?GLUCOSE 168* 126*  ?BUN 46* 46*  ?CREATININE 2.00* 1.93*  ?CALCIUM 9.0 9.0  ?MG 2.2 2.2  ? ?Liver Function Tests ?Recent Labs  ?  02/19/22 ?0319  ?AST 24  ?ALT 18  ?ALKPHOS 88  ?BILITOT 1.8*  ?PROT 7.6  ?ALBUMIN 3.7  ? ?No results for input(s): LIPASE, AMYLASE in the last 72 hours. ?Cardiac Enzymes ?No results for input(s): CKTOTAL, CKMB, CKMBINDEX, TROPONINI in the last 72 hours. ? ?BNP: ?BNP (last 3 results) ?Recent Labs  ?  07/12/21 ?1243 12/11/21 ?0936 02/19/22 ?0319  ?BNP 3,229.8* 2,931.4* >4,500.0*  ? ? ?ProBNP (last 3 results) ?No results for input(s): PROBNP in the last 8760 hours. ? ? ?D-Dimer ?No results for input(s): DDIMER in the last 72 hours. ?Hemoglobin A1C ?Recent Labs  ?  02/20/22 ?0436  ?HGBA1C 5.3  ? ?Fasting Lipid Panel ?No results for input(s): CHOL, HDL, LDLCALC, TRIG, CHOLHDL, LDLDIRECT in the last 72 hours. ?Thyroid Function Tests ?Recent Labs  ?  02/20/22 ?0436  ?TSH 0.998  ? ? ?Other results: ? ? ?Imaging  ? ? ?ECHOCARDIOGRAM COMPLETE ? ?Result Date: 02/19/2022 ?   ECHOCARDIOGRAM REPORT   Patient Name:  Dale [name] Date of Exam: 02/19/2022 Medical Rec #:  [id number]           Height:       71.0 in Accession #:    [id number]          Weight:       231.6 lb Date of Birth:  Mar 22, 1961           BSA:          2.244 m? Patient Age:    61 years            BP:           123/103 mmHg Patient Gender: M                   HR:           94 bpm. Exam Location:  Inpatient Procedure: 2D Echo Indications:    CHF  History:        Patient has prior history of Echocardiogram examinations, most                 recent 05/12/2021. CHF and Cardiomyopathy, CAD; Risk                 Factors:Dyslipidemia and Hypertension.  Sonographer:    Arlyss Gandy Referring Phys: [place]  1. Left ventricular ejection fraction, by estimation, is 10-15%. The left ventricle has severely  decreased function. The left ventricle demonstrates global hypokinesis. The left ventricular internal cavity size was severely dilated. Indeterminate diastolic filling due to E-A fusion.  2. Right ventricular systolic function is severely reduced. The right ventricular size is severely enlarged. There is mildly elevated pulmonary artery systolic pressure. The estimated right ventricular systolic pressure is 0000000 mmHg.  3. Left atrial size was severely dilated.  4. Right atrial size was severely dilated.  5. The mitral valve is grossly normal. Mild to moderate mitral valve regurgitation. No evidence of mitral stenosis.  6. The aortic valve is tricuspid. Aortic valve regurgitation is trivial. No aortic stenosis is present.  7. The inferior vena cava is dilated in size with <50% respiratory variability, suggesting right atrial pressure of 15 mmHg. Comparison(s): No significant change from prior study. FINDINGS  Left Ventricle: Left ventricular ejection fraction, by estimation, is 10-15%. The left ventricle has severely decreased function. The left ventricle demonstrates global hypokinesis. The left ventricular internal cavity size was severely dilated. There is no left ventricular hypertrophy. Indeterminate diastolic filling due to E-A fusion. Right Ventricle: The right ventricular size is severely enlarged. No increase in right ventricular wall thickness. Right ventricular systolic function is severely reduced. There is mildly elevated pulmonary artery systolic pressure. The tricuspid regurgitant velocity is 2.38 m/s, and with an assumed right atrial pressure of 15 mmHg, the estimated right ventricular systolic pressure is 0000000 mmHg. Left Atrium: Left atrial size was severely dilated. Right Atrium: Right atrial size was severely dilated. Pericardium: There is no evidence of pericardial effusion. Mitral Valve: The mitral valve is grossly normal. Mild to moderate mitral valve regurgitation. No evidence of mitral valve  stenosis. Tricuspid Valve: The tricuspid valve is grossly normal. Tricuspid valve regurgitation is mild . No evidence of tricuspid stenosis. Aortic Valve: The aortic valve is tricuspid. Aortic valve regurgitation is trivial. No aortic stenosis is present. Aortic valve mean gradient measures 2.0 mmHg. Aortic valve peak gradient measures 3.2 mmHg. Aortic valve area, by VTI measures 2.09 cm?. Pulmonic Valve: The pulmonic valve was grossly normal.  Pulmonic valve regurgitation is mild. No evidence of pulmonic stenosis. Aorta: The aortic root and ascending aorta are structurally normal, with no evidence of dilitation. Venous: The inferior vena cava is dilated in size with less than 50% respiratory variability, suggesting right atrial pressure of 15 mmHg. IAS/Shunts: The atrial septum is grossly normal.  LEFT VENTRICLE PLAX 2D LVIDd:         7.40 cm   Diastology LVIDs:         7.20 cm   LV e' lateral:   5.25 cm/s LV PW:         0.80 cm   LV E/e' lateral: 16.9 LV IVS:        0.80 cm LVOT diam:     2.30 cm LV SV:         31 LV SV Index:   14 LVOT Area:     4.15 cm?  RIGHT VENTRICLE            IVC RV Basal diam:  6.00 cm    IVC diam: 3.20 cm RV Mid diam:    5.90 cm RV S prime:     5.93 cm/s TAPSE (M-mode): 1.0 cm LEFT ATRIUM            Index        RIGHT ATRIUM           Index LA diam:      4.60 cm  2.05 cm/m?   RA Area:     37.70 cm? LA Vol (A2C): 126.0 ml 56.14 ml/m?  RA Volume:   150.00 ml 66.84 ml/m? LA Vol (A4C): 108.0 ml 48.12 ml/m?  AORTIC VALVE                    PULMONIC VALVE AV Area (Vmax):    2.88 cm?     PV Vmax:       0.50 m/s AV Area (Vmean):   2.23 cm?     PV Peak grad:  1.0 mmHg AV Area (VTI):     2.09 cm? AV Vmax:           89.60 cm/s AV Vmean:          61.350 cm/s AV VTI:            0.147 m AV Peak Grad:      3.2 mmHg AV Mean Grad:      2.0 mmHg LVOT Vmax:         62.10 cm/s LVOT Vmean:        33.000 cm/s LVOT VTI:          0.074 m LVOT/AV VTI ratio: 0.50  AORTA Ao Root diam: 3.70 cm Ao Asc diam:  3.50 cm  MITRAL VALVE               TRICUSPID VALVE MV Area (PHT): 3.54 cm?    TR Peak grad:   22.7 mmHg MV Decel Time: 214 msec    TR Vmax:        238.00 cm/s MV E velocity: 88.70 cm/s                            S

## 2022-02-20 NOTE — TOC Initial Note (Addendum)
Transition of Care (TOC) - Initial/Assessment Note  ? ? ?Patient Details  ?Name: Dale [name] ?MRN: [id number] ?Date of Birth: 1961-01-31 ? ?Transition of Care (TOC) CM/SW Contact:    ?Flynn Lininger, LCSW ?Phone Number: ?02/20/2022, 12:11 PM ? ?Clinical Narrative:                 ?HF CSW is very familiar with Mr. Obermann and CSW spoke with Mr. Nield at bedside to address the substance use and housing consult. Mr. Yildiz reported that he is still using substances and is possibly open to inpatient treatment and agreed to educational materials and resources offered. HF CSW provided Mr. Ladd with a packet full of resources for shelter, transportation, housing, substance use treatment options, etc. Mr. Ortel reported that he is "staying with friends and wherever he can find a place to stay and he takes it day by day." CSW confirmed Mr. Lennon's phone number with him to be [phone]. CSW allows for Mr. Yoshino to have personal autonomy of his decision making in his life decisions and is careful not to be manipulated by the patient. ? ?CSW will continue to follow throughout discharge. ? ?  ?Barriers to Discharge: Continued Medical Work up ? ? ?Patient Goals and CMS Choice ?  ?  ?  ? ?Expected Discharge Plan and Services ?  ?In-house Referral: Clinical Social Work ?  ?  ?Living arrangements for the past 2 months: Homeless ?                ?  ?  ?  ?  ?  ?  ?  ?  ?  ?  ? ?Prior Living Arrangements/Services ?Living arrangements for the past 2 months: Homeless ?Lives with:: Self ?Patient language and need for interpreter reviewed:: Yes ?       ?Need for Family Participation in Patient Care: No (Comment) ?Care giver support system in place?: No (comment) ?  ?Criminal Activity/Legal Involvement Pertinent to Current Situation/Hospitalization: No - Comment as needed ? ?Activities of Daily Living ?  ?  ? ?Permission Sought/Granted ?  ?  ?   ?   ?   ?   ? ?Emotional Assessment ?Appearance:: Appears stated  age ?Attitude/Demeanor/Rapport: Engaged ?Affect (typically observed): Pleasant ?Orientation: : Oriented to Self, Oriented to Place, Oriented to  Time, Oriented to Situation ?Alcohol / Substance Use: Illicit Drugs ?Psych Involvement: No (comment) ? ?Admission diagnosis:  Hypokalemia [E87.6] ?Elevated troponin [R77.8] ?Acute exacerbation of CHF (congestive heart failure) ([place]) [I50.9] ?AKI (acute kidney injury) ([place]) [N17.9] ?Acute on chronic combined systolic and diastolic CHF (congestive heart failure) ([place]) [I50.43] ?Paronychia of right middle finger [L03.011] ?Stage 3 chronic kidney disease, unspecified whether stage 3a or 3b CKD ([place]) [N18.30] ?Patient Active Problem List  ? Diagnosis Date Noted  ? Alcohol use disorder, moderate, in early remission ([place]) 07/13/2021  ? Adjustment disorder with mixed anxiety and depressed mood   ? Palliative care by specialist   ? Prolonged QT interval 06/14/2021  ? 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 CHF (congestive heart failure) ([place]) 06/13/2021  ? Acute on chronic systolic CHF (congestive heart failure), NYHA class 4 (HCC)   ? CKD (chronic kidney disease), stage III ([place]) 06/01/2021  ? Hypokalemia   ? Acute on chronic HFrEF (heart failure with reduced ejection fraction) ([place]) 12/25/2020  ? Coronary artery disease involving native coronary artery of native heart without angina pectoris   ? Acute kidney injury superimposed on  chronic kidney disease ([place]) 10/16/2020  ? Acute on chronic combined systolic and diastolic HF (heart failure) ([place]) 09/08/2020  ? Acute on chronic combined systolic (congestive) and diastolic (congestive) heart failure ([place])   ? Acute CHF (congestive heart failure) ([place]) 05/07/2019  ? Nausea & vomiting 05/07/2019  ? Acute exacerbation of CHF (congestive heart failure) ([place]) 03/11/2019  ? Troponin level elevated 03/11/2019  ? Hyperlipidemia 03/03/2019  ? NSVT (nonsustained ventricular tachycardia)  ([place]) 03/03/2019  ? Anxiety and depression   ? NSTEMI (non-ST elevated myocardial infarction) ([place]) 02/28/2019  ? Acute systolic heart failure ([place]) 11/08/2018  ? Hypertensive urgency 09/28/2018  ? Acute pulmonary edema ([place]) 09/28/2018  ? Cocaine abuse ([place]) 09/28/2018  ? Substance abuse ([place]) 09/28/2018  ? Chronic kidney disease, stage II (mild) 09/28/2018  ? Normochromic anemia 09/28/2018  ? Acute systolic CHF (congestive heart failure) ([place]) 09/27/2018  ? Essential hypertension 03/11/2016  ? Obesity (BMI 30-39.9) 03/11/2016  ? Glucosuria 03/11/2016  ? ?PCP:  Nahser, Wonda Cheng, MD ?Pharmacy:   ?[facility] [place] [address] ?[place] ?[place] [address] ?Phone: [phone] Fax: [phone] ? ?[place] ?[place] ?[place] [address] ?Phone: [phone] Fax: [phone] ? ?[facility] ?[address] ?[place] [address] ?Phone: [phone] Fax: [phone] ? ?[facility] ?[address] ?[place] [address] ?Phone: [phone] Fax: [phone] ? ? ? ? ?Social Determinants of Health (SDOH) Interventions ?  ? ?Readmission Risk Interventions ? ?  12/15/2020  ?  3:39 PM 09/11/2020  ? 10:19 AM  ?Readmission Risk Prevention Plan  ?Transportation Screening Complete Complete  ?PCP or Specialist Appt within 3-5 Days  Complete  ?[place] or Home Care Consult  Complete  ?Social Work Consult for [place] Planning/Counseling  Complete  ?Palliative Care Screening  Complete  ?Medication Review Press photographer) Complete Complete  ?PCP or Specialist appointment within 3-5 days of discharge Complete   ?[place] or Home Care Consult Complete   ?SW Recovery Care/Counseling Consult Complete   ?Palliative Care Screening Not Applicable   ?[place] Not Applicable   ? ?[place], MSW, LCSW ?[phone] ?Heart Failure Social  Worker  ? ?

## 2022-02-20 NOTE — Progress Notes (Addendum)
Patient had episode of Tanna Furry, MD notified  ?

## 2022-02-21 DIAGNOSIS — I5043 Acute on chronic combined systolic (congestive) and diastolic (congestive) heart failure: Secondary | ICD-10-CM | POA: Diagnosis not present

## 2022-02-21 LAB — BASIC METABOLIC PANEL
Anion gap: 11 (ref 5–15)
Anion gap: 9 (ref 5–15)
BUN: 45 mg/dL — ABNORMAL HIGH (ref 6–20)
BUN: 48 mg/dL — ABNORMAL HIGH (ref 6–20)
CO2: 24 mmol/L (ref 22–32)
CO2: 31 mmol/L (ref 22–32)
Calcium: 8.8 mg/dL — ABNORMAL LOW (ref 8.9–10.3)
Calcium: 8.9 mg/dL (ref 8.9–10.3)
Chloride: 96 mmol/L — ABNORMAL LOW (ref 98–111)
Chloride: 93 mmol/L — ABNORMAL LOW (ref 98–111)
Creatinine, Ser: 1.82 mg/dL — ABNORMAL HIGH (ref 0.61–1.24)
Creatinine, Ser: 1.74 mg/dL — ABNORMAL HIGH (ref 0.61–1.24)
GFR, Estimated: 43 mL/min — ABNORMAL LOW (ref >60)
GFR, Estimated: 45 mL/min — ABNORMAL LOW (ref >60)
Glucose, Bld: 101 mg/dL — ABNORMAL HIGH (ref 70–99)
Glucose, Bld: 116 mg/dL — ABNORMAL HIGH (ref 70–99)
Potassium: 3.4 mmol/L — ABNORMAL LOW (ref 3.5–5.1)
Potassium: 4 mmol/L (ref 3.5–5.1)
Sodium: 131 mmol/L — ABNORMAL LOW (ref 135–145)
Sodium: 133 mmol/L — ABNORMAL LOW (ref 135–145)

## 2022-02-21 LAB — CBC
HCT: 34.5 % — ABNORMAL LOW (ref 39.0–52.0)
Hemoglobin: 11.9 g/dL — ABNORMAL LOW (ref 13.0–17.0)
MCH: 31.9 pg (ref 26.0–34.0)
MCHC: 34.5 g/dL (ref 30.0–36.0)
MCV: 92.5 fL (ref 80.0–100.0)
Platelets: 227 10*3/uL (ref 150–400)
RBC: 3.73 MIL/uL — ABNORMAL LOW (ref 4.22–5.81)
RDW: 14.4 % (ref 11.5–15.5)
WBC: 7.3 10*3/uL (ref 4.0–10.5)
nRBC: 0 % (ref 0.0–0.2)

## 2022-02-21 LAB — MAGNESIUM: Magnesium: 2.1 mg/dL (ref 1.7–2.4)

## 2022-02-21 MED ORDER — SODIUM CHLORIDE 0.9% FLUSH: 3.0000 mL | Freq: Two times a day (BID) | INTRAVENOUS | Status: DC

## 2022-02-21 MED ORDER — SPIRONOLACTONE 12.5 MG HALF TABLET
12.5000 mg | ORAL_TABLET | Freq: Every day | ORAL | Status: DC
  Administered 2022-02-22: 12.5 mg via ORAL
  Filled 2022-02-21: qty 1

## 2022-02-21 MED ORDER — DIGOXIN 125 MCG PO TABS
0.1250 mg | ORAL_TABLET | Freq: Every day | ORAL | Status: DC
  Administered 2022-02-21 – 2022-02-22 (×2): 0.125 mg via ORAL
  Filled 2022-02-21 (×2): qty 1

## 2022-02-21 MED ORDER — ENOXAPARIN SODIUM 40 MG/0.4ML IJ SOSY
40.0000 mg | PREFILLED_SYRINGE | INTRAMUSCULAR | Status: DC
Start: 2022-02-21 — End: 2022-02-22
  Filled 2022-02-21: qty 0.4

## 2022-02-21 MED ORDER — POTASSIUM CHLORIDE CRYS ER 20 MEQ PO TBCR
40.0000 meq | EXTENDED_RELEASE_TABLET | Freq: Once | ORAL | Status: AC
  Administered 2022-02-21: 40 meq via ORAL
  Filled 2022-02-21: qty 2

## 2022-02-21 NOTE — Progress Notes (Addendum)
Patient ID: Dale [name], male   DOB: Nov 11, 1960, 61 y.o.   MRN: [id number] ?  ? ? Advanced Heart Failure Rounding Note ? ?PCP-Cardiologist: Mertie Moores, MD  ?AHF: Dr. Aundra Dubin  ? ?Subjective:   ? ?Pt declined PICC line. On empiric milrinone 0.25 + lasix gtt at 12/hr, got metolazone 2.5 x 1 yesterday.  ? ?Excellent diuresis, weight down 4 lbs.  Breathing better.  K low, had short NSVT runs.  Creatinine stable 1.82.  ? ? ?Objective:   ?Weight Range: ?114.4 kg ?Body mass index is 35.17 kg/m?.  ? ?Vital Signs:   ?Temp:  [97.8 ?F (36.6 ?C)-98.5 ?F (36.9 ?C)] 98 ?F (36.7 ?C) (04/20 0505) ?Pulse Rate:  [97-100] 97 (04/19 1942) ?Resp:  [18-20] 20 (04/19 1942) ?BP: (88-112)/(52-87) 103/76 (04/20 0505) ?SpO2:  [92 %-100 %] 92 % (04/20 0028) ?Weight:  [114.4 kg] 114.4 kg (04/20 0505) ?Last BM Date : 02/19/22 ? ?Weight change: ?Filed Weights  ? 02/19/22 1904 02/20/22 0342 02/21/22 0505  ?Weight: 115.8 kg 116.4 kg 114.4 kg  ? ? ?Intake/Output:  ? ?Intake/Output Summary (Last 24 hours) at 02/21/2022 0725 ?Last data filed at 02/21/2022 0530 ?Gross per 24 hour  ?Intake 1470.64 ml  ?Output 8650 ml  ?Net -7179.36 ml  ?  ? ? ?Physical Exam  ?  ?General: NAD ?Neck: JVP 12-14 cm, no thyromegaly or thyroid nodule.  ?Lungs: Clear to auscultation bilaterally with normal respiratory effort. ?CV: Lateral PMI.  Heart regular S1/S2, no S3/S4, no murmur.  1+ edema to knees ?Abdomen: Soft, nontender, no hepatosplenomegaly, no distention.  ?Skin: Intact without lesions or rashes.  ?Neurologic: Alert and oriented x 3.  ?Psych: Normal affect. ?Extremities: No clubbing or cyanosis.  ?HEENT: Normal.  ? ?Telemetry  ? ?NSR with 3 NSVT runs. Personally reviewed.  ? ?EKG  ?  ?No new EKG to review  ? ?Labs  ?  ?CBC ?Recent Labs  ?  02/19/22 ?[id number] 02/20/22 ?[id number] 02/21/22 ?0454  ?WBC 8.3 6.5 7.3  ?NEUTROABS 6.9  --   --   ?HGB 12.4* 11.7* 11.9*  ?HCT 37.1* 35.2* 34.5*  ?MCV 95.1 94.9 92.5  ?PLT 245 229 227  ? ?Basic Metabolic Panel ?Recent Labs  ?   02/20/22 ?0436 02/20/22 ?1114 02/21/22 ?0454  ?NA 139 139 131*  ?K 3.7 3.6 3.4*  ?CL 104 104 96*  ?CO2 27 26 24   ?GLUCOSE 126* 117* 101*  ?BUN 46* 41* 45*  ?CREATININE 1.93* 1.79* 1.82*  ?CALCIUM 9.0 8.7* 8.8*  ?MG 2.2  --  2.1  ? ?Liver Function Tests ?Recent Labs  ?  02/19/22 ?0319  ?AST 24  ?ALT 18  ?ALKPHOS 88  ?BILITOT 1.8*  ?PROT 7.6  ?ALBUMIN 3.7  ? ?No results for input(s): LIPASE, AMYLASE in the last 72 hours. ?Cardiac Enzymes ?No results for input(s): CKTOTAL, CKMB, CKMBINDEX, TROPONINI in the last 72 hours. ? ?BNP: ?BNP (last 3 results) ?Recent Labs  ?  07/12/21 ?1243 12/11/21 ?0936 02/19/22 ?0319  ?BNP 3,229.8* 2,931.4* >4,500.0*  ? ? ?ProBNP (last 3 results) ?No results for input(s): PROBNP in the last 8760 hours. ? ? ?D-Dimer ?No results for input(s): DDIMER in the last 72 hours. ?Hemoglobin A1C ?Recent Labs  ?  02/20/22 ?0436  ?HGBA1C 5.3  ? ?Fasting Lipid Panel ?Recent Labs  ?  02/20/22 ?0436  ?CHOL 127  ?HDL 30*  ?[place] 87  ?TRIG 49  ?CHOLHDL 4.2  ? ?Thyroid Function Tests ?Recent Labs  ?  02/20/22 ?0436  ?TSH 0.998  ? ? ?  Other results: ? ? ?Imaging  ? ? ?No results found. ? ? ?Medications:   ? ? ?Scheduled Medications: ? aspirin EC  81 mg Oral Daily  ? atorvastatin  40 mg Oral Daily  ? calcium carbonate  1 tablet Oral Daily  ? dapagliflozin propanediol  10 mg Oral Daily  ? digoxin  0.125 mg Oral Daily  ? enoxaparin (LOVENOX) injection  40 mg Subcutaneous Q24H  ? hydrALAZINE  12.5 mg Oral Q8H  ? isosorbide mononitrate  15 mg Oral Daily  ? lidocaine-EPINEPHrine  20 mL Infiltration Once  ? potassium chloride  40 mEq Oral BID  ? potassium chloride  40 mEq Oral Once  ? sodium chloride flush  3 mL Intravenous Q12H  ? spironolactone  25 mg Oral Daily  ? ? ?Infusions: ? furosemide (LASIX) 200 mg in dextrose 5% 100 mL (2mg /mL) infusion 12 mg/hr (02/20/22 2103)  ? milrinone 0.25 mcg/kg/min (02/20/22 2103)  ? ? ?PRN Medications: ?acetaminophen **OR** acetaminophen, polyethylene glycol ? ? ? ?Patient Profile   ? ?Mr Alpern is a 61 year old with h/o polysubstance abuse, HTN, CAD, CKD Stage IIIb, and combined HFrEF.  ?  ?Admitted with A/C Biventricular HFrEF in the setting of medication noncompliance.  ? ?Assessment/Plan  ? ?1. Acute on chronic systolic CHF:  Mixed ischemic/nonischemic cardiomyopathy.  Has known CAD with CTO of RCA, but cardiomyopathy is out of proportion to known CAD.  Suspect heavy ETOH is a strong contributor to biventricular failure.  Echo this admission with EF 10-15%, severe LV dilation, severe RV enlargement with severe RV dysfunction, mild-moderate MR, dilated IVC.  He has been off all meds > 1 month and continues to drink ETOH.  He was markedly volume overloaded on exam with NYHA class IV symptoms at admission.  With rise in creatinine to 2 from baseline around 1.5 and poor response to initial IV Lasix, I have been concerned for low output HF. He refused PICC to allow for co-ox and CVP monitoring. He is now on empiric milrinone 0.25 mcg/kg/min to facilitate diuresis. He diuresed well yesterday on Lasix gtt 12 mg/hr + metolazone dose.  Creatinine stable 1.82.  Still with some volume overload on exam.  ?- Continue milrinone 0.25 while diuresing.  ?- Add digoxin 0.125 daily.  ?- Continue Lasix gtt 12 mg/hr today, replace K and repeat BMET in the afternoon.  ?- Continue UNNA boots   ?- Continue hydralazine 12.5 tid + Imdur 15. No BP room to increase.  ?- Continue Spironolactone 25 mg daily  ?- Continue Farxiga 10 mg daily  ?- No beta blocker with marked volume overload, hold Entresto with elevated creatinine for now.  ?- Not a candidate for advanced therapies with homelessness and substance abuse.  Imperative to quit drinking ETOH.  ?- RHC/LHC scheduled for tomorrow to formally assess filling pressures, cardiac output, reassess coronaries. Discussed risks/benefits with patient, he agrees to procedure.  ?2. CAD: LHC in 7/20 with CTO of RCA.  Admitted in 7/22 with HS-TnI > 1000, no cath that admission.  No chest pain.  Mildly elevated HS-TnI with no trend.  Doubt ACS.  ?- Continue ASA 81 and statin.  ?- Would favor RHC/LHC this admission when stabilized and creatinine lower => planned for tomorrow.  ?3. ETOH abuse: Patient tells me that he continues to drink heavily.  ?- Watch for withdrawal (CIWA).  ?- Would favor rehab if this could be arranged.  ?4. AKI on CKD stage 3: Baseline creatinine around 1.5, up to 2 this  admission. Suspect cardiorenal syndrome.  Follow closely,continue milrinone and diuresis.  Creatinine 1.8 today.  ?5. H/o cocaine abuse: Says he has quit.  ?6. Homelessness: Needs social work help.  ?7. NSVT: In setting of milrinone use and low K, replace K aggressively and repeat BMET in pm.  ?8. Switch DVT prophylaxis to Lovenox in preparation for cath.  ? ?Length of Stay: 2 ? ?Loralie Champagne, MD  ?02/21/2022, 7:25 AM ? ?Advanced Heart Failure Team ?Pager [phone] (M-F; 7a - 5p)  ?Please contact [facility] for night-coverage after hours (5p -7a ) and weekends on [email or web address] ?

## 2022-02-21 NOTE — Progress Notes (Addendum)
? ?Subjective: Patient with episode of Vtach last night. Repeat BMP and Mg largely WNL. ? ?This a.m., patient again reports feeling better in terms of breathing, decreased swelling, and increased urinary output.  His appetite is intact. We discuss the plan from cardiology's standpoint, and he adamantly declines having the RHC/LHC procedure tomorrow for "personal reasons"  on which he does not elaborate. He denies chest pain, headache, dizziness, and is both ambulating and voiding appropriately. ? ?He denies acute concerns or complaints today. ? ?Objective: ? ?Vital signs in last 24 hours: ?Vitals:  ? 02/20/22 1942 02/20/22 2231 02/21/22 0028 02/21/22 0505  ?BP: (!) 88/52 106/87 104/72 103/76  ?Pulse: 97     ?Resp: 20     ?Temp: 98.1 ?F (36.7 ?C)  97.8 ?F (36.6 ?C) 98 ?F (36.7 ?C)  ?TempSrc: Oral  Oral Oral  ?SpO2: 97%  92%   ?Weight:    114.4 kg  ?Height:      ? ?Physical Exam: ?General: Middle-aged male sitting comfortably on edge of bed after eating breakfast in NAD without [place] ?HENT: normocephalic, atraumatic, external nares and ears appear normal ?EYES: conjunctiva non-erythematous, no scleral icterus ?CV: regular rate, normal rhythm, no murmurs, rubs, gallops.  1+ pitting edema bilateral lower extremities to just below the knees. ?Pulmonary: normal work of breathing on RA, lungs clear to auscultation bilaterally.  Sats 100% on room air ?Abdominal: non-distended, soft, non-tender to palpation, normal BS all 4 quadrants ?Skin: Warm and dry ?Neurological: Awake, alert and oriented x3. No focal deficits of CNII-XII. ?Psych: normal affect and behavior.  ? ?Assessment/Plan: ?Dale [name] is a 61 year old male with a history of HFrEF with LVEF <10% (now 10-15%) and polysubstance use disorder who presents with 4 days of worsening dyspnea and edema after 1 month of medication noncompliance found to have acute exacerbation of CHF. ? ?Principal Problem: ?  Acute exacerbation of CHF (congestive heart failure)  ([place]) ? ?#Acute on Chronic HFrEF Exacerbation in the Setting of Medication Noncompliance ?#Hypertension ?#CAD ?Patient with 4 days of dyspnea in the setting of running out of his medications for the past month (multiple hospitalizations over the past 2 years secondary to medication noncompliance). His last echocardiogram was completed on 05/12/2021, and showed ejection fraction <10 with grade 2 diastolic dysfunction, severe left and right atrial dilatation. He does follow with Dr. Cathie Olden and the heart failure clinic; he has been lost to follow-up since 12/2021.  Repeat echo with LVEF 10 to 15% but largely unchanged from prior.  CHF team following, appreciate recs. 24 hour net output: 7.179 L, Weight: 114 kg. Cardiology planned for RHC/LHC, but patient declines. ?-Continue Lasix gtt. and milrinone infusion IV per cardiology recs; metolazone discontinued ?--Initiate digoxin 0.125 mg ?--Unna boots in place ?- Strict I&Os ?- Daily Weights ?- Trend BMP; f/u afternoon BMP today ?- Continue Aldactone 25 mg daily ?- Continue Farxiga 10 mg daily ?- Continue aspirin 81 mg ?- Continue atorvastatin 40 mg ?-Continue hydralazine 12.5 mg every 8 hours ?- Continue Imdur 15 mg daily ?- Continue potassium supplementation 40 mEq twice daily in light of continuous diuresis. ?- Hold Coreg in the setting of acute HFrEF exacerbation ?- Holding Entresto in the setting of kidney function ? ?#CKD Stage IIIb ?Creatinine 1.82 this a.m., which is likely secondary to his volume overloaded state.  Expect to improve with diuresis.  Baseline appears to be ~1.6. ?- Trend BMP ?- Continue Farxiga 10 mg daily ?- Avoid nephrotoxic agents ?  ?#History of Polysubstance Abuse ?#Prior Cocaine  Abuse ?#Current alcohol Abuse ?#Current Marijuana Use ?Dale [name] has a history of cocaine and marijuana use.  He has been clean from cocaine for the past 9 months.  In the past he does not qualify for an ICD given his cocaine usage and medication noncompliance. He does  endorse smoking marijuana.  He has cut back on his alcohol significantly, and only drinks 1 alcoholic beverage every 2 weeks. His UDS without cocaine and positive for THC. ?-TOC provided rehabilitation and housing resources ?- Chaplain consult placed to discuss patient's struggle with substance use in light of the death of his grandmother; attempted to see patient yesterday, will f/u. ? ?#Hyperlipidemia: ?LDL 87, total cholesterol 127. ?- Continue home atorvastatin 40 mg ? ? ?Best practices: ?Diet: Heart healthy diet with 1200 mL fluid restriction ?DVT prophylaxis: Lovenox 40 mg daily  ?Electrolytes: Replete to Mg > 2 and K >4.0 ? ?Prior to Admission Living Arrangement: Unhomed ?Anticipated Discharge Location: Pending ?Barriers to Discharge: Clinical improvement ?Dispo: Anticipated discharge in approximately 2-5 day(s).  ? Rosezetta Schlatter, MD ?02/21/2022, 10:04 AM ?Pager: [phone] ?After 5pm on weekdays and 1pm on weekends: On Call pager [phone]  ?

## 2022-02-21 NOTE — TOC CM/SW Note (Signed)
HF TOC CM spoke to pt and provided him with Medicaid transportation number to call and get his transportation set up with Medicaid. Explained he will need to arrange rides 2 days prior to appt and he will need to have information about physician, location and office number. Pt states he lives with friends. Able to get meds. His PCP, Dr Melburn Popper, but has not seen in awhile. Explained to schedule follow up with PCP post dc. Isidoro Donning RN3 CCM, Heart Failure TOC CM [phone]  ?

## 2022-02-21 NOTE — Plan of Care (Signed)
This MD notified by RN that patient was refusing all medications. Spoke to patient, who expressed his concerns. His questions were answered, and indications for medications were explained. Patient was agreeable to take medications but again reiterated that he did not want to proceed with RHC/LHC. Heart failure MD to follow-up with patient regarding his wishes. ? ? ?Lamar Sprinkles, MD ?Internal Medicine/ Psych Intern ?PGY-1 ?02/21/2022 1:48 PM ?Pager: [phone] ?Please contact the on call pager after 5 pm and on weekends at [phone].   ?

## 2022-02-22 ENCOUNTER — Other Ambulatory Visit (HOSPITAL_COMMUNITY): Payer: Self-pay

## 2022-02-22 DIAGNOSIS — I5043 Acute on chronic combined systolic (congestive) and diastolic (congestive) heart failure: Secondary | ICD-10-CM | POA: Diagnosis not present

## 2022-02-22 LAB — BASIC METABOLIC PANEL
Anion gap: 8 (ref 5–15)
BUN: 44 mg/dL — ABNORMAL HIGH (ref 6–20)
CO2: 30 mmol/L (ref 22–32)
Calcium: 8.8 mg/dL — ABNORMAL LOW (ref 8.9–10.3)
Chloride: 95 mmol/L — ABNORMAL LOW (ref 98–111)
Creatinine, Ser: 1.69 mg/dL — ABNORMAL HIGH (ref 0.61–1.24)
GFR, Estimated: 46 mL/min — ABNORMAL LOW (ref >60)
Glucose, Bld: 105 mg/dL — ABNORMAL HIGH (ref 70–99)
Potassium: 3.6 mmol/L (ref 3.5–5.1)
Sodium: 133 mmol/L — ABNORMAL LOW (ref 135–145)

## 2022-02-22 LAB — CBC
HCT: 37.2 % — ABNORMAL LOW (ref 39.0–52.0)
Hemoglobin: 12.8 g/dL — ABNORMAL LOW (ref 13.0–17.0)
MCH: 31.6 pg (ref 26.0–34.0)
MCHC: 34.4 g/dL (ref 30.0–36.0)
MCV: 91.9 fL (ref 80.0–100.0)
Platelets: 257 10*3/uL (ref 150–400)
RBC: 4.05 MIL/uL — ABNORMAL LOW (ref 4.22–5.81)
RDW: 14.3 % (ref 11.5–15.5)
WBC: 6.6 10*3/uL (ref 4.0–10.5)
nRBC: 0 % (ref 0.0–0.2)

## 2022-02-22 SURGERY — RIGHT/LEFT HEART CATH AND CORONARY ANGIOGRAPHY
Anesthesia: LOCAL

## 2022-02-22 MED ORDER — DIGOXIN 125 MCG PO TABS
0.1250 mg | ORAL_TABLET | Freq: Every day | ORAL | 0 refills | Status: DC
  Filled 2022-02-22: qty 15, 15d supply, fill #0

## 2022-02-22 MED ORDER — ISOSORB DINITRATE-HYDRALAZINE 20-37.5 MG PO TABS
0.5000 | ORAL_TABLET | Freq: Three times a day (TID) | ORAL | 0 refills | Status: DC
Start: 2022-02-22 — End: 2022-03-11
  Filled 2022-02-22: qty 30, 20d supply, fill #0

## 2022-02-22 MED ORDER — DAPAGLIFLOZIN PROPANEDIOL 10 MG PO TABS
10.0000 mg | ORAL_TABLET | Freq: Every day | ORAL | 0 refills | Status: DC
  Filled 2022-02-22: qty 15, 15d supply, fill #0

## 2022-02-22 MED ORDER — TORSEMIDE 20 MG PO TABS
40.0000 mg | ORAL_TABLET | Freq: Every day | ORAL | Status: DC
  Administered 2022-02-22: 40 mg via ORAL
  Filled 2022-02-22: qty 2

## 2022-02-22 MED ORDER — TORSEMIDE 20 MG PO TABS
40.0000 mg | ORAL_TABLET | Freq: Every day | ORAL | 0 refills | Status: DC
  Filled 2022-02-22: qty 30, 15d supply, fill #0

## 2022-02-22 MED ORDER — POTASSIUM CHLORIDE CRYS ER 20 MEQ PO TBCR
20.0000 meq | EXTENDED_RELEASE_TABLET | Freq: Every day | ORAL | 0 refills | Status: DC
  Filled 2022-02-22: qty 15, 15d supply, fill #0

## 2022-02-22 MED ORDER — ISOSORB DINITRATE-HYDRALAZINE 20-37.5 MG PO TABS
0.5000 | ORAL_TABLET | Freq: Three times a day (TID) | ORAL | Status: DC
  Administered 2022-02-22: 0.5 via ORAL
  Filled 2022-02-22: qty 1

## 2022-02-22 MED ORDER — SPIRONOLACTONE 25 MG PO TABS
12.5000 mg | ORAL_TABLET | Freq: Every day | ORAL | 0 refills | Status: DC
  Filled 2022-02-22: qty 15, 30d supply, fill #0

## 2022-02-22 NOTE — Discharge Summary (Signed)
? ?Name: Dale [name] ?MRN: [id number] ?DOB: 07/01/1961 61 y.o. ?PCP: Thayer Headings, MD ? ?Date of Admission: 02/19/2022  2:56 AM ?Date of Discharge: 02/22/2022  11:31 AM ?Attending Physician: Bobby Rumpf, MD ? ?Discharge Diagnosis: ?Principal Problem: ?  Acute exacerbation of CHF (congestive heart failure) ([place]) ? ?Discharge Medications: ?Allergies as of 02/22/2022   ? ?   Reactions  ? Hydrocodone Itching  ? ?  ? ?  ?Medication List  ?  ? ?STOP taking these medications   ? ?acetaminophen 325 MG tablet ?Commonly known as: TYLENOL ?  ?carvedilol 3.125 MG tablet ?Commonly known as: COREG ?  ?Entresto 49-51 MG ?Generic drug: sacubitril-valsartan ?  ?hydrOXYzine 25 MG tablet ?Commonly known as: ATARAX ?  ? ?  ? ?TAKE these medications   ? ?aspirin 81 MG EC tablet ?Take 1 tablet (81 mg total) by mouth daily. Swallow whole. ?  ?atorvastatin 40 MG tablet ?Commonly known as: LIPITOR ?Take 1 tablet (40 mg total) by mouth daily. ?  ?BiDil 20-37.5 MG tablet ?Generic drug: isosorbide-hydrALAZINE ?Take 0.5 tablets by mouth 3 (three) times daily. ?  ?diclofenac sodium 1 % Gel ?Commonly known as: VOLTAREN ?Apply 2 g topically daily as needed (pain). ?  ?digoxin 0.125 MG tablet ?Commonly known as: LANOXIN ?Take 1 tablet (0.125 mg total) by mouth daily. ?Start taking on: February 23, 2022 ?  ?Farxiga 10 MG Tabs tablet ?Generic drug: dapagliflozin propanediol ?Take 1 tablet (10 mg total) by mouth daily. ?Start taking on: February 23, 2022 ?  ?potassium chloride SA 20 MEQ tablet ?Commonly known as: KLOR-CON M ?Take 1 tablet (20 mEq total) by mouth daily. ?  ?spironolactone 25 MG tablet ?Commonly known as: ALDACTONE ?Take 0.5 tablets (12.5 mg total) by mouth daily. ?Start taking on: February 23, 2022 ?What changed: how much to take ?  ?torsemide 20 MG tablet ?Commonly known as: DEMADEX ?Take 2 tablets (40 mg total) by mouth daily for 15 days. ?Start taking on: February 23, 2022 ?What changed:  ?how much to take ?when to take  this ?additional instructions ?  ? ?  ? ? ?Disposition and follow-up:   ?[name] was discharged from [facility] in [place] condition.  At the hospital follow up visit please address: ? ?1.  CHF- Review medications.  Entresto and Coreg held during admission.  Consider restarting.  Spironolactone dose halved, assess need to increase. ? ?2.  Labs / imaging needed at time of follow-up: Echocardiogram 5/5 with Dr. Aundra Dubin. ? ?3.  Pending labs/ test needing follow-up: N/A ? ?Follow-up Appointments: ? Follow-up Information   ? ? Nahser, Wonda Cheng, MD .   ?Specialty: Cardiology ?Contact information: ?[address]. ?[address] ?[place] [address] ?[phone] ? ? ?  ?  ? ? [place]. Go on 03/01/2022.   ?Specialty: Internal Medicine ?Why: @2 :00pm ?Contact information: ?[address] ?[place] ?[phone] ? ?  ?  ? ?  ?  ? ?  ? ? ?Hospital Course by problem list: ?#Acute on Chronic HFrEF Exacerbation in the Setting of Medication Noncompliance ?#Hypertension ?#CAD ?Upon presentation, patient with 4 days of dyspnea in the setting of running out of his medications for the past month (multiple hospitalizations over the past 2 years secondary to medication noncompliance). His last echocardiogram was completed on 05/12/2021, and showed ejection fraction <10 with grade 2 diastolic dysfunction, severe left and right atrial dilatation. He does follow with Dr. Cathie Olden and the  heart failure clinic; he has been lost to follow-up since 12/2021.  Repeat echo this admission with LVEF 10 to 15% but largely unchanged from prior.  Total net output: 14.087 L, Weight: 109.8 kg. Cardiology planned for RHC/LHC, but patient declined. Discharge medication regimen: digoxin 0.125 mg, spironolactone 12.5 mg daily, Farxiga 10 mg daily, ASA 81 mg, Atorvastatin 40 mg, BIDIL 20-37.5 mg 0.5 tablet TID, Torsemide 40 mg daily, and Klor-Con 20 mEq daily. Unna boots in place until  outpatient follow-up. Continue to hold Coreg in the setting of acute HFrEF exacerbation with decompensation and Entresto in the setting of worsening renal function. ? ?#CKD Stage IIIb ?Creatinine 1.69 on day of discharge, which is likely secondary to his volume overloaded state.  Improved with diuresis.  Baseline appears to be ~1.6. ?  ?#History of Polysubstance Abuse ?#Prior Cocaine Abuse ?#Current alcohol Abuse ?#Current Marijuana Use ?Mr. Guess has a history of cocaine and marijuana use.  He has been clean from cocaine for the past 9 months.  In the past he does not qualify for an ICD given his cocaine usage and medication noncompliance. He does endorse smoking marijuana.  He continues to drink an increased amount of alcohol. His UDS without cocaine and positive for THC. TOC provided rehabilitation and housing resources ? ?#Hyperlipidemia ?Home atorvastatin 40 mg continued throughout admission ?  ? ?Discharge Subjective: ?NAEON. Patient reports frustrations about food and states that he is ready to discharge. He reports improvements to respiratory status and fluid status. No acute concerns or complaints today. ? ?Discharge Exam:   ?BP 111/88 (BP Location: Right Arm)   Pulse 94   Temp 98.1 ?F (36.7 ?C) (Oral)   Resp 20   Ht 5\' 11"  (1.803 m)   Wt 109.8 kg Comment: scale a  SpO2 96%   BMI 33.75 kg/m?  ?Discharge exam:  ?General: Middle-aged male fully dressed in street clothing sitting in bedside chair, expressing frustrations but in NAD. ?HENT: normocephalic, atraumatic, external nares and ears appear normal ?EYES: conjunctiva non-erythematous, no scleral icterus ?CV: regular rate, normal rhythm, no murmurs, rubs, gallops. No edema present below the knees. Unna boots in place, so unable to assess volume status of remainder of LE. ?Pulmonary: normal work of breathing on RA, lungs clear to auscultation bilaterally.  Sats 100% on room air ?Abdominal: non-distended, soft, non-tender to palpation, normal BS all  4 quadrants ?Skin: Warm and dry ?Neurological: Awake, alert and oriented x3. No focal deficits of CNII-XII. ?Psych: irritable affect and behavior.  ?  ? ?Pertinent Labs, Studies, and Procedures:  ? ?DG Chest 2 View ? ?Result Date: 02/19/2022 ?CLINICAL DATA:  Dyspnea EXAM: CHEST - 2 VIEW COMPARISON:  06/13/2021 FINDINGS: There is moderate cardiomegaly, unchanged. Mild right basilar opacity. No pneumothorax or sizable pleural effusion. IMPRESSION: Moderate cardiomegaly without overt pulmonary edema. Mild right basilar opacity could indicate early infection. Electronically Signed   By: Ulyses Jarred M.D.   On: 02/19/2022 03:38  ? ?ECHOCARDIOGRAM COMPLETE ? ?Result Date: 02/19/2022 ?   ECHOCARDIOGRAM REPORT   Patient Name:   Dale [name] Date of Exam: 02/19/2022 Medical Rec #:  [id number]           Height:       71.0 in Accession #:    [id number]          Weight:       231.6 lb Date of Birth:  11/23/1960           BSA:  2.244 m? Patient Age:    11 years            BP:           123/103 mmHg Patient Gender: M                   HR:           94 bpm. Exam Location:  Inpatient Procedure: 2D Echo Indications:    CHF  History:        Patient has prior history of Echocardiogram examinations, most                 recent 05/12/2021. CHF and Cardiomyopathy, CAD; Risk                 Factors:Dyslipidemia and Hypertension.  Sonographer:    Arlyss Gandy Referring Phys: [place]  1. Left ventricular ejection fraction, by estimation, is 10-15%. The left ventricle has severely decreased function. The left ventricle demonstrates global hypokinesis. The left ventricular internal cavity size was severely dilated. Indeterminate diastolic filling due to E-A fusion.  2. Right ventricular systolic function is severely reduced. The right ventricular size is severely enlarged. There is mildly elevated pulmonary artery systolic pressure. The estimated right ventricular systolic pressure is 0000000 mmHg.  3. Left  atrial size was severely dilated.  4. Right atrial size was severely dilated.  5. The mitral valve is grossly normal. Mild to moderate mitral valve regurgitation. No evidence of mitral stenosis.  6. The aortic valve is tr

## 2022-02-22 NOTE — Progress Notes (Signed)
Pt asking for diet order as he is refusing cath today. ?

## 2022-02-22 NOTE — Discharge Instructions (Addendum)
Dear Dale [name], ?I am so glad you are feeling better and can be discharged today (02/22/2022)! You were admitted for acute worsening of your chronic heart failure.  ? ?Please see the following instructions: ?Please see your primary care / family doctor and specialists for your medical issues, concerns and/or health care needs. ?Primary Care appointment 4/28 ?Cardiology appointment 5/5 ?NEW meds:  ?Digoxin 0.125 mg daily ?Isosorbide-hydralazine 20-27.5 mg, 0.5 tablets 3 times daily ?Potassium chloride 20 mEq daily ?CONTINUE home meds (some with changes): ?Spironolactone 12.5 mg, 0.5 tablet daily ?Torsemide 2 tablets (40 mg) daily ?Aspirin 81 mg daily ?Atorvastatin 40 mg daily ?Farxiga 10 mg daily ?STOP meds (discussed with cardiology when to restart):  ?Coreg 3.125 mg ?Entresto 49-51 mg ? ?Report any adverse effects and or reactions from the medicines to your outpatient provider promptly. Do not engage in alcohol and or illegal drug use while on prescription medicines. In the event of worsening symptoms call 911 and/or go to the nearest ED for appropriate evaluation and treatment of symptoms. ? ?It was a pleasure meeting you, Dale [name].  I wish you and your family the best, and hope you stay happy and healthy! ? ?Lamar Sprinkles, MD ?02/22/2022   ?

## 2022-02-22 NOTE — Progress Notes (Addendum)
At start of shift, pt irritable, cussing and upset that TV in room no longer working. Facilities contacted and came to bedside but unable to fix TV. Discussed with charge RN and pt moved to new room with working TV and pt appreciative of this and less irritable.   ?Later pt c/o unna boots to legs feeling too tight. Unna boots removed per pt request and called ortho tech who placed new unna boots.  ? ?RN discussed with pt plan for heart cath tomorrow and that pt needs to be NPO after midnight for this. Pt adamantly states he is NOT going to do heart cath tomorrow and that he will not stay NPO after midnight. RN encouraged pt to stay NPO and discuss with MD in am.  ? ?Around 0500, pt upset about NPO order and adamantly saying he will not do heart cath. RN informed pt that when MD arrives this am he can discuss NPO status and heart cath with MD.  ?

## 2022-02-22 NOTE — TOC Transition Note (Signed)
Transition of Care (TOC) - CM/SW Discharge Note ? ? ?Patient Details  ?Name: Dale [name] ?MRN: [id number] ?Date of Birth: [birth year] ? ?Transition of Care (TOC) CM/SW Contact:  ?Leone Haven, RN ?Phone Number: ?02/22/2022, 12:03 PM ? ? ?Clinical Narrative:    ?Patient is for dc today, he states he needs a bus pass, NCM gave patient a bus pass.  TOC pharmacy to fill medications prior to dc.  ? ? ?  ?Barriers to Discharge: Continued Medical Work up ? ? ?Patient Goals and CMS Choice ?  ?  ?  ? ?Discharge Placement ?  ?           ?  ?  ?  ?  ? ?Discharge Plan and Services ?In-house Referral: Clinical Social Work ?  ?           ?  ?  ?  ?  ?  ?  ?  ?  ?  ?  ? ?Social Determinants of Health (SDOH) Interventions ?Food Insecurity Interventions: Assist with SNAP Application ?Financial Strain Interventions: Artist ?Housing Interventions: Other (Comment) (provided shelter resources) ?Alcohol Brief Interventions/Follow-up: Alcohol education/Brief advice (ETOH Alcohol resources provided) ? ? ?Readmission Risk Interventions ? ?  12/15/2020  ?  3:39 PM 09/11/2020  ? 10:19 AM  ?Readmission Risk Prevention Plan  ?Transportation Screening Complete Complete  ?PCP or Specialist Appt within 3-5 Days  Complete  ?HRI or Home Care Consult  Complete  ?Social Work Consult for Recovery Care Planning/Counseling  Complete  ?Palliative Care Screening  Complete  ?Medication Review Oceanographer) Complete Complete  ?PCP or Specialist appointment within 3-5 days of discharge Complete   ?HRI or Home Care Consult Complete   ?SW Recovery Care/Counseling Consult Complete   ?Palliative Care Screening Not Applicable   ?Skilled Nursing Facility Not Applicable   ? ? ? ? ? ?

## 2022-02-22 NOTE — Progress Notes (Signed)
Patient ID: Dale [name], male   DOB: 09-Jun-1961, 61 y.o.   MRN: [id number] ?  ? ? Advanced Heart Failure Rounding Note ? ?PCP-Cardiologist: Mertie Moores, MD  ?AHF: Dr. Aundra Dubin  ? ?Subjective:   ? ?Pt declined PICC line. This morning, refuses cath. On empiric milrinone 0.25 + lasix gtt at 12/hr, good diuresis again with weight down 10 lbs.  ? ?Creatinine 1.82 => 1.69.   ? ? ?Objective:   ?Weight Range: ?109.8 kg ?Body mass index is 33.75 kg/m?.  ? ?Vital Signs:   ?Temp:  [98 ?F (36.7 ?C)-98.6 ?F (37 ?C)] 98.1 ?F (36.7 ?C) (04/21 [id number]) ?Pulse Rate:  [85-100] 94 (04/21 0718) ?Resp:  [15-20] 20 (04/21 [id number]) ?BP: (84-111)/(62-88) 111/88 (04/21 [id number]) ?SpO2:  [94 %-98 %] 96 % (04/21 0718) ?Weight:  [109.8 kg] 109.8 kg (04/21 0140) ?Last BM Date : 02/19/22 ? ?Weight change: ?Filed Weights  ? 02/20/22 0342 02/21/22 0505 02/22/22 0140  ?Weight: 116.4 kg 114.4 kg 109.8 kg  ? ? ?Intake/Output:  ? ?Intake/Output Summary (Last 24 hours) at 02/22/2022 0735 ?Last data filed at 02/22/2022 0720 ?Gross per 24 hour  ?Intake 1461.73 ml  ?Output 5650 ml  ?Net -4188.27 ml  ?  ? ? ?Physical Exam  ?  ?General: NAD ?Neck: No JVD, no thyromegaly or thyroid nodule.  ?Lungs: Clear to auscultation bilaterally with normal respiratory effort. ?CV: Lateral PMI.  Heart regular S1/S2, no S3/S4, no murmur.  1+ ankle edema.  ?Abdomen: Soft, nontender, no hepatosplenomegaly, no distention.  ?Skin: Intact without lesions or rashes.  ?Neurologic: Alert and oriented x 3.  ?Psych: Normal affect. ?Extremities: No clubbing or cyanosis.  ?HEENT: Normal.  ? ? ?Telemetry  ? ?NSR with occ PVCs. Personally reviewed.  ? ?EKG  ?  ?No new EKG to review  ? ?Labs  ?  ?CBC ?Recent Labs  ?  02/21/22 ?0454 02/22/22 ?0404  ?WBC 7.3 6.6  ?HGB 11.9* 12.8*  ?HCT 34.5* 37.2*  ?MCV 92.5 91.9  ?PLT 227 257  ? ?Basic Metabolic Panel ?Recent Labs  ?  02/20/22 ?0436 02/20/22 ?1114 02/21/22 ?0454 02/21/22 ?1449 02/22/22 ?0404  ?NA 139   < > 131* 133* 133*  ?K 3.7   < > 3.4* 4.0  3.6  ?CL 104   < > 96* 93* 95*  ?CO2 27   < > 24 31 30   ?GLUCOSE 126*   < > 101* 116* 105*  ?BUN 46*   < > 45* 48* 44*  ?CREATININE 1.93*   < > 1.82* 1.74* 1.69*  ?CALCIUM 9.0   < > 8.8* 8.9 8.8*  ?MG 2.2  --  2.1  --   --   ? < > = values in this interval not displayed.  ? ?Liver Function Tests ?No results for input(s): AST, ALT, ALKPHOS, BILITOT, PROT, ALBUMIN in the last 72 hours. ? ?No results for input(s): LIPASE, AMYLASE in the last 72 hours. ?Cardiac Enzymes ?No results for input(s): CKTOTAL, CKMB, CKMBINDEX, TROPONINI in the last 72 hours. ? ?BNP: ?BNP (last 3 results) ?Recent Labs  ?  07/12/21 ?1243 12/11/21 ?0936 02/19/22 ?0319  ?BNP 3,229.8* 2,931.4* >4,500.0*  ? ? ?ProBNP (last 3 results) ?No results for input(s): PROBNP in the last 8760 hours. ? ? ?D-Dimer ?No results for input(s): DDIMER in the last 72 hours. ?Hemoglobin A1C ?Recent Labs  ?  02/20/22 ?0436  ?HGBA1C 5.3  ? ?Fasting Lipid Panel ?Recent Labs  ?  02/20/22 ?0436  ?CHOL 127  ?HDL 30*  ?[place]ife  87  ?TRIG 49  ?CHOLHDL 4.2  ? ?Thyroid Function Tests ?Recent Labs  ?  02/20/22 ?0436  ?TSH 0.998  ? ? ?Other results: ? ? ?Imaging  ? ? ?No results found. ? ? ?Medications:   ? ? ?Scheduled Medications: ? aspirin EC  81 mg Oral Daily  ? atorvastatin  40 mg Oral Daily  ? calcium carbonate  1 tablet Oral Daily  ? dapagliflozin propanediol  10 mg Oral Daily  ? digoxin  0.125 mg Oral Daily  ? enoxaparin (LOVENOX) injection  40 mg Subcutaneous Q24H  ? isosorbide-hydrALAZINE  0.5 tablet Oral TID  ? lidocaine-EPINEPHrine  20 mL Infiltration Once  ? potassium chloride  40 mEq Oral BID  ? sodium chloride flush  3 mL Intravenous Q12H  ? spironolactone  12.5 mg Oral Daily  ? torsemide  40 mg Oral Daily  ? ? ?Infusions: ? milrinone 0.25 mcg/kg/min (02/22/22 0534)  ? ? ?PRN Medications: ?acetaminophen **OR** acetaminophen, polyethylene glycol ? ? ? ?Patient Profile  ? ?[name] is a 61 year old with h/o polysubstance abuse, HTN, CAD, CKD Stage IIIb, and combined  HFrEF.  ?  ?Admitted with A/C Biventricular HFrEF in the setting of medication noncompliance.  ? ?Assessment/Plan  ? ?1. Acute on chronic systolic CHF:  Mixed ischemic/nonischemic cardiomyopathy.  Has known CAD with CTO of RCA, but cardiomyopathy is out of proportion to known CAD.  Suspect heavy ETOH is a strong contributor to biventricular failure.  Echo this admission with EF 10-15%, severe LV dilation, severe RV enlargement with severe RV dysfunction, mild-moderate MR, dilated IVC.  He has been off all meds > 1 month and continues to drink ETOH.  He was markedly volume overloaded on exam with NYHA class IV symptoms at admission.  With rise in creatinine to 2 from baseline around 1.5 and poor response to initial IV Lasix, I have been concerned for low output HF. He refused PICC to allow for co-ox and CVP monitoring. He is now on empiric milrinone 0.25 mcg/kg/min to facilitate diuresis. He diuresed well yesterday on Lasix gtt 12 mg/hr.  Creatinine stable 1.82 => 1.69.  Volume status looks better.  ?- Decrease milrinone to 0.125.  ?- Stop IV Lasix and start torsemide 40 mg daily.  Replace K.  ?- Continue digoxin 0.125 daily.  ?- Continue UNNA boots   ?- Bidil 1/2 tab tid, watch BP.  ?- Continue Spironolactone 12.5 mg daily  ?- Continue Farxiga 10 mg daily  ?- No beta blocker with marked volume overload, hold Entresto with elevated creatinine for now.  ?- Not a candidate for advanced therapies with homelessness and substance abuse.  Imperative to quit drinking ETOH.  ?- He has refused both cath and PICC.  ?2. CAD: LHC in 7/20 with CTO of RCA.  Admitted in 7/22 with HS-TnI > 1000, no cath that admission. No chest pain.  Mildly elevated HS-TnI with no trend.  Doubt ACS.  ?- Continue ASA 81 and statin.  ?- Refuses cath. ?3. ETOH abuse: Patient tells me that he continues to drink heavily at home.  ?- Watch for withdrawal (CIWA).  ?4. AKI on CKD stage 3: Baseline creatinine around 1.5, up to 2 this admission. Suspect  cardiorenal syndrome.  Follow closely,continue milrinone and diuresis.  Creatinine 1.69 today.  ?5. H/o cocaine abuse: Says he has quit.  ?6. Homelessness: Needs social work help.  ?7. NSVT: None overnight.  ? ?Length of Stay: 3 ? ?Loralie Champagne, MD  ?02/22/2022, 7:35 AM ? ?  Advanced Heart Failure Team ?Pager [phone] (M-F; 7a - 5p)  ?Please contact [facility] for night-coverage after hours (5p -7a ) and weekends on [email or web address] ?

## 2022-03-01 ENCOUNTER — Ambulatory Visit: Payer: Self-pay | Admitting: Nurse Practitioner

## 2022-03-08 ENCOUNTER — Ambulatory Visit (HOSPITAL_COMMUNITY): Admission: RE | Admit: 2022-03-08 | Payer: Medicaid Other | Source: Ambulatory Visit

## 2022-03-08 ENCOUNTER — Encounter (HOSPITAL_COMMUNITY): Payer: Medicaid Other | Admitting: Cardiology

## 2022-03-11 ENCOUNTER — Other Ambulatory Visit (HOSPITAL_COMMUNITY): Payer: Self-pay | Admitting: *Deleted

## 2022-03-11 ENCOUNTER — Other Ambulatory Visit (HOSPITAL_COMMUNITY): Payer: Self-pay

## 2022-03-11 ENCOUNTER — Other Ambulatory Visit: Payer: Self-pay | Admitting: Cardiovascular Disease

## 2022-03-11 MED ORDER — POTASSIUM CHLORIDE CRYS ER 20 MEQ PO TBCR
20.0000 meq | EXTENDED_RELEASE_TABLET | Freq: Every day | ORAL | 0 refills | Status: DC
Start: 2022-03-11 — End: 2022-03-13
  Filled 2022-03-11: qty 15, 15d supply, fill #0

## 2022-03-11 MED ORDER — SPIRONOLACTONE 25 MG PO TABS
12.5000 mg | ORAL_TABLET | Freq: Every day | ORAL | 0 refills | Status: DC
  Filled 2022-03-11: qty 15, 30d supply, fill #0

## 2022-03-11 MED ORDER — TORSEMIDE 20 MG PO TABS
40.0000 mg | ORAL_TABLET | Freq: Every day | ORAL | 0 refills | Status: DC
  Filled 2022-03-11: qty 60, 30d supply, fill #0

## 2022-03-11 MED ORDER — ISOSORB DINITRATE-HYDRALAZINE 20-37.5 MG PO TABS
0.5000 | ORAL_TABLET | Freq: Three times a day (TID) | ORAL | 0 refills | Status: DC
Start: 2022-03-11 — End: 2022-03-13
  Filled 2022-03-11: qty 30, 20d supply, fill #0

## 2022-03-11 MED ORDER — DIGOXIN 125 MCG PO TABS
0.1250 mg | ORAL_TABLET | Freq: Every day | ORAL | 0 refills | Status: DC
Start: 2022-03-11 — End: 2022-03-14
  Filled 2022-03-11: qty 30, 30d supply, fill #0

## 2022-03-11 MED ORDER — DAPAGLIFLOZIN PROPANEDIOL 10 MG PO TABS
10.0000 mg | ORAL_TABLET | Freq: Every day | ORAL | 0 refills | Status: DC
  Filled 2022-03-11: qty 30, 30d supply, fill #0

## 2022-03-12 ENCOUNTER — Other Ambulatory Visit (HOSPITAL_COMMUNITY): Payer: Self-pay

## 2022-03-13 ENCOUNTER — Other Ambulatory Visit (HOSPITAL_COMMUNITY): Payer: Self-pay

## 2022-03-13 ENCOUNTER — Ambulatory Visit (HOSPITAL_COMMUNITY)
Admission: RE | Admit: 2022-03-13 | Discharge: 2022-03-13 | Disposition: A | Discharge status: Home / Self Care | Payer: Medicaid Other | Source: Ambulatory Visit | Attending: Cardiology | Admitting: Cardiology

## 2022-03-13 ENCOUNTER — Encounter (HOSPITAL_COMMUNITY): Payer: Self-pay | Admitting: Cardiology

## 2022-03-13 VITALS — BP 110/80 | HR 99 | Wt 243.2 lb

## 2022-03-13 DIAGNOSIS — Z5900 Homelessness unspecified: Secondary | ICD-10-CM | POA: Diagnosis not present

## 2022-03-13 DIAGNOSIS — I739 Peripheral vascular disease, unspecified: Secondary | ICD-10-CM | POA: Diagnosis not present

## 2022-03-13 DIAGNOSIS — I429 Cardiomyopathy, unspecified: Secondary | ICD-10-CM | POA: Insufficient documentation

## 2022-03-13 DIAGNOSIS — I272 Pulmonary hypertension, unspecified: Secondary | ICD-10-CM | POA: Insufficient documentation

## 2022-03-13 DIAGNOSIS — R0602 Shortness of breath: Secondary | ICD-10-CM | POA: Insufficient documentation

## 2022-03-13 DIAGNOSIS — F1021 Alcohol dependence, in remission: Secondary | ICD-10-CM | POA: Diagnosis not present

## 2022-03-13 DIAGNOSIS — I10 Essential (primary) hypertension: Secondary | ICD-10-CM | POA: Diagnosis not present

## 2022-03-13 DIAGNOSIS — Z79899 Other long term (current) drug therapy: Secondary | ICD-10-CM | POA: Insufficient documentation

## 2022-03-13 DIAGNOSIS — I252 Old myocardial infarction: Secondary | ICD-10-CM | POA: Diagnosis not present

## 2022-03-13 DIAGNOSIS — I5040 Unspecified combined systolic (congestive) and diastolic (congestive) heart failure: Secondary | ICD-10-CM | POA: Diagnosis not present

## 2022-03-13 DIAGNOSIS — F1411 Cocaine abuse, in remission: Secondary | ICD-10-CM | POA: Insufficient documentation

## 2022-03-13 DIAGNOSIS — I130 Hypertensive heart and chronic kidney disease with heart failure and stage 1 through stage 4 chronic kidney disease, or unspecified chronic kidney disease: Secondary | ICD-10-CM | POA: Diagnosis present

## 2022-03-13 DIAGNOSIS — N183 Chronic kidney disease, stage 3 unspecified: Secondary | ICD-10-CM

## 2022-03-13 DIAGNOSIS — Z7982 Long term (current) use of aspirin: Secondary | ICD-10-CM | POA: Diagnosis not present

## 2022-03-13 DIAGNOSIS — F1010 Alcohol abuse, uncomplicated: Secondary | ICD-10-CM | POA: Diagnosis not present

## 2022-03-13 DIAGNOSIS — I5042 Chronic combined systolic (congestive) and diastolic (congestive) heart failure: Secondary | ICD-10-CM | POA: Insufficient documentation

## 2022-03-13 DIAGNOSIS — I2510 Atherosclerotic heart disease of native coronary artery without angina pectoris: Secondary | ICD-10-CM

## 2022-03-13 DIAGNOSIS — I5022 Chronic systolic (congestive) heart failure: Secondary | ICD-10-CM | POA: Diagnosis present

## 2022-03-13 LAB — BASIC METABOLIC PANEL
Anion gap: 8 (ref 5–15)
BUN: 31 mg/dL — ABNORMAL HIGH (ref 6–20)
CO2: 19 mmol/L — ABNORMAL LOW (ref 22–32)
Calcium: 9.5 mg/dL (ref 8.9–10.3)
Chloride: 110 mmol/L (ref 98–111)
Creatinine, Ser: 1.68 mg/dL — ABNORMAL HIGH (ref 0.61–1.24)
GFR, Estimated: 47 mL/min — ABNORMAL LOW (ref >60)
Glucose, Bld: 101 mg/dL — ABNORMAL HIGH (ref 70–99)
Potassium: 4.8 mmol/L (ref 3.5–5.1)
Sodium: 137 mmol/L (ref 135–145)

## 2022-03-13 LAB — MAGNESIUM: Magnesium: 2.4 mg/dL (ref 1.7–2.4)

## 2022-03-13 LAB — BRAIN NATRIURETIC PEPTIDE: B Natriuretic Peptide: >4500 pg/mL — ABNORMAL HIGH (ref 0.0–100.0)

## 2022-03-13 MED ORDER — SPIRONOLACTONE 25 MG PO TABS
12.5000 mg | ORAL_TABLET | Freq: Every day | ORAL | 2 refills | Status: DC
  Filled 2022-03-13: qty 15, 30d supply, fill #0

## 2022-03-13 MED ORDER — ATORVASTATIN CALCIUM 40 MG PO TABS
40.0000 mg | ORAL_TABLET | Freq: Every day | ORAL | 2 refills | Status: DC
  Filled 2022-03-13: qty 30, 30d supply, fill #0
  Filled 2022-04-25: qty 30, 30d supply, fill #1

## 2022-03-13 MED ORDER — ASPIRIN 81 MG PO TBEC
81.0000 mg | DELAYED_RELEASE_TABLET | Freq: Every day | ORAL | 2 refills | Status: DC
  Filled 2022-03-13 (×2): qty 30, 30d supply, fill #0

## 2022-03-13 MED ORDER — DIGOXIN 125 MCG PO TABS
0.1250 mg | ORAL_TABLET | Freq: Every day | ORAL | 2 refills | Status: DC
  Filled 2022-03-13 – 2022-04-25 (×2): qty 30, 30d supply, fill #0

## 2022-03-13 MED ORDER — ISOSORB DINITRATE-HYDRALAZINE 20-37.5 MG PO TABS
0.5000 | ORAL_TABLET | Freq: Three times a day (TID) | ORAL | 2 refills | Status: DC
  Filled 2022-03-13: qty 45, 30d supply, fill #0

## 2022-03-13 MED ORDER — DAPAGLIFLOZIN PROPANEDIOL 10 MG PO TABS
10.0000 mg | ORAL_TABLET | Freq: Every day | ORAL | 2 refills | Status: DC
  Filled 2022-03-13 – 2022-04-25 (×3): qty 30, 30d supply, fill #0

## 2022-03-13 MED ORDER — TORSEMIDE 20 MG PO TABS
40.0000 mg | ORAL_TABLET | Freq: Every day | ORAL | 2 refills | Status: DC
  Filled 2022-03-13: qty 60, 30d supply, fill #0

## 2022-03-13 MED ORDER — POTASSIUM CHLORIDE CRYS ER 20 MEQ PO TBCR
20.0000 meq | EXTENDED_RELEASE_TABLET | Freq: Every day | ORAL | 2 refills | Status: DC
  Filled 2022-03-13: qty 30, 30d supply, fill #0

## 2022-03-13 MED ORDER — POTASSIUM CHLORIDE CRYS ER 20 MEQ PO TBCR
20.0000 meq | EXTENDED_RELEASE_TABLET | Freq: Every day | ORAL | 2 refills | Status: DC
  Filled 2022-03-13: qty 15, 15d supply, fill #0

## 2022-03-13 NOTE — Patient Instructions (Signed)
EKG done today. ? ?Labs done today. We will contact you only if your labs are abnormal. ? ?No medication changes were made. Please continue all current medications as prescribed. ? ?Your physician has requested that you have a lower extremity arterial exercise duplex. During this test, exercise and ultrasound are used to evaluate arterial blood flow in the legs. Allow one hour for this exam. There are no restrictions or special instructions. This has to be approved through your insurance company prior to scheduling, once approved we will contact you to schedule an appointment  ? ?Your physician recommends that you schedule a follow-up appointment in: 10 days for a lab only appointment and in 3 weeks with our NP/PA Clinic here in our office ? ?If you have any questions or concerns before your next appointment please send [place] a message through [place] or call our office at [phone].   ? ?TO LEAVE A MESSAGE FOR THE NURSE SELECT OPTION 2, PLEASE LEAVE A MESSAGE INCLUDING: ?YOUR NAME ?DATE OF BIRTH ?CALL BACK NUMBER ?REASON FOR CALL**this is important as we prioritize the call backs ? ?YOU WILL RECEIVE A CALL BACK THE SAME DAY AS LONG AS YOU CALL BEFORE 4:00 PM ? ? ?Do the following things EVERYDAY: ?Weigh yourself in the morning before breakfast. Write it down and keep it in a log. ?Take your medicines as prescribed ?Eat low salt foods--Limit salt (sodium) to 2000 mg per day.  ?Stay as active as you can everyday ?Limit all fluids for the day to less than 2 liters ? ? ?At the [facility] Failure Clinic, you and your health needs are our priority. As part of our continuing mission to provide you with exceptional heart care, we have created designated Provider Care Teams. These Care Teams include your primary Cardiologist (physician) and Advanced Practice Providers (APPs- Physician Assistants and Nurse Practitioners) who all work together to provide you with the care you need, when you need it.  ? ?You may see any of  the following providers on your designated Care Team at your next follow up: ?Dr Arvilla Meres ?Dr Marca Ancona ?Tonye Becket, NP ?Robbie Lis, PA ?Karle Plumber, PharmD ? ? ?Please be sure to bring in all your medications bottles to every appointment.  ? ?You are scheduled for a Cardiac Catheterization on Monday, May 15 with Dr. Marca Ancona. ? ?1. Please arrive at the Main Entrance A at [facility]: [address] [place], [place] [address] at 10:00 AM (This time is two hours before your procedure to ensure your preparation). Free valet parking service is available.  ? ?Special note: Every effort is made to have your procedure done on time. Please understand that emergencies sometimes delay scheduled procedures. ? ?2. Diet: Do not eat solid foods after midnight.  You may have clear liquids until 5 AM upon the day of the procedure. ? ?4. Medication instructions in preparation for your procedure: ? ?-Hold your Torsemide the day of your procedure. ? ?-Do not take [name] the day of your procedure and hold it for 48 hours after your procedure ? ?On the morning of your procedure, take Aspirin and any morning medicines NOT listed above.  You may use sips of water. ? ?5. Plan to go home the same day, you will only stay overnight if medically necessary. ?6. You MUST have a responsible adult to drive you home. ?7. An adult MUST be with you the first 24 hours after you arrive home. ?8. Bring a current list of  your medications, and the last time and date medication taken. ?9. Bring ID and current insurance cards. ?10.Please wear clothes that are easy to get on and off and wear slip-on shoes. ? ?Thank you for allowing [place] to care for you! ?  -- [facility] ? ?

## 2022-03-13 NOTE — Progress Notes (Signed)
Patient requested to meet with CSW due to food insecurity and transportation concerns. Patient is well known to CSW from previous visits. Patient states he is staying with some friends and periodically stays with his Mom. Patient states he is on a bus line and able to get to and from appointments with no issues via the bus. Patient asked for some lunch while in the clinic as he had not eaten today. CSW provided lunch in the clinic and bus passes to get home today and to return for his procedure next week. Patient's medications were refilled at the [facility] outpatient pharmacy and provided to patient during clinic visit. Patient grateful for the support of the clinic and will follow up next week with his procedure. CSW continues to follow and be available as needed. [name], LCSW, CCSW-MCS [phone] ? ?

## 2022-03-14 NOTE — Progress Notes (Signed)
? ?ADVANCED HF CLINIC CONSULT NOTE ? ?PCP: None ?HF Cardiologist: Dr. Aundra Dubin ? ?HPI: ?Name Dale [name] is a 61 y.o. male  with a PMH significant for hypertension, polysubstance use (including cocaine & alcohol), chronic combined CHF, and CAD. ?  ?He was admitted 11/19 with signs/symptoms of CHF, in the setting of poorly controlled hypertension alcohol and cocaine use. He was started on IV Lasix and echo showed EF 20%, G2 DD.  He left AMA. ?  ?Readmitted 12/19 with CHF exacerbation, started on IV diuretics, beta-blocker, ARB and BiDil.  Discharged on oral torsemide.  He remained positive for cocaine on UDS. ?  ?Admitted 4/20 with chest pain and CHF. Diagnosed with NSTEMI. He declined cardiac catheterization.   ?  ?Admitted 5/20 with CHF and chest pain, still using cocaine, but no evidence of ischemia.  Diuresed and discharged. ? ?Echo (5/20) EF 20 to 25%, elevated LVEDP, diffuse hypokinesis with worse hypokinesis and inferior wall moderately enlarged RV with normal function. ? ?Admitted in 7/20 with acute CHF and underwent L/RHC showing severe single vessel CAD with CTO of pRCA, mild to moderate non-obs CAD involving LAD and LCx, moderately elevated left heart filling pressures, severely elevated right heart filling pressures, mild pulmonary hypertension and normal CO/CI by Fick. He was treated medically. ? ?2 CHF admissions 2021. 6 admissions in 2022.  ?  ?Admitted 7/22 with NSTEMI and troponin > 1000,  but treated medically due to continued cocaine use and concern for poor medication adherence if stent was needed. ?  ?Admitted 8/22 with CHF exacerbation.  Echo with EF <10%, medications restarted. Farxiga and spiro added.   ? ?He was seen in [facility] 9/22, doing well from CHF standpoint and compliant with medication regimen.  ? ?Admitted again in 4/23 with CHF, refused PICC line and refused RHC/LHC.  Concern for low output HF with AKI/creatinine up to 2.  Patient was started on milrinone empirically with good  diuresis, creatinine down to about 1.7. Echo this admission showed EF 10-15%, global HK, severe LV dilation, severely enlarged RV with severely decreased systolic function, severe biatrial enlargement, moderate MR, dilated IVC.  ?  ?Patient returns for followup of CHF.  He is not using cocaine or smoking cigarettes, but he is smoking marijuana and drinking heavily (1 quart liquor every 2 days or so).  He has been out of all his meds except for Bidil x 5 days.  He is currently living with a friend.  He feels worse off his meds.  He is short of breath walking short distances (20 feet).  No orthopnea/PND.  No chest pain.  Bilateral foot aching.  ? ?ECG (personally reviewed): NSR, LAFB, poor RWP, lateral TWIs.  ? ?Labs (9/22): K 3.9, creatinine 1.67 ?Labs (4/23): K 3.6, creatinine 1.69, hgb 12.8, LDL 87 ? ?Review of Systems:  All systems reviewed and negative except as per HPI.  ? ?PMH: ?1. ETOH abuse.  ?2. Cocaine abuse: Has been off cocaine x months.  ?3. CAD: Cath in 7/20 with totally occluded RCA with collaterals.  ?- NSTEMI in 7/22 with HS-TnI > 1000 but no cath.  ?4. CKD stage 3 ?5. HTN ?6. Hyperlipidemia ?7. Chronic systolic CHF: Suspect mixed ischemic/nonischemic cardiomyopathy.  See cath report as above from 7/20.  He continues to drink ETOH heavily, used cocaine in the past.  Both could contribute.  ?- Echo (4/23): EF 10-15%, global HK, severe LV dilation, severely enlarged RV with severely decreased systolic function, severe biatrial enlargement, moderate MR, dilated IVC ? ? ?  Current Outpatient Medications  ?Medication Sig Dispense Refill  ? diclofenac sodium (VOLTAREN) 1 % GEL Apply 2 g topically daily as needed (pain).    ? aspirin 81 MG EC tablet Take 1 tablet (81 mg total) by mouth daily. Swallow whole. 30 tablet 2  ? atorvastatin (LIPITOR) 40 MG tablet Take 1 tablet (40 mg total) by mouth daily. 30 tablet 2  ? dapagliflozin propanediol (FARXIGA) 10 MG TABS tablet Take 1 tablet (10 mg total) by mouth  daily. (Patient not taking: Reported on 03/13/2022) 30 tablet 0  ? dapagliflozin propanediol (FARXIGA) 10 MG TABS tablet Take 1 tablet (10 mg total) by mouth daily. 30 tablet 2  ? digoxin (LANOXIN) 0.125 MG tablet Take 1 tablet (0.125 mg total) by mouth daily. (Patient not taking: Reported on 03/13/2022) 30 tablet 0  ? digoxin (LANOXIN) 0.125 MG tablet Take 1 tablet (0.125 mg total) by mouth daily. 30 tablet 2  ? isosorbide-hydrALAZINE (BIDIL) 20-37.5 MG tablet Take 0.5 tablets by mouth 3 (three) times daily. 90 tablet 2  ? potassium chloride SA (KLOR-CON M) 20 MEQ tablet Take 1 tablet (20 mEq total) by mouth daily. 30 tablet 2  ? spironolactone (ALDACTONE) 25 MG tablet Take 0.5 tablets (12.5 mg total) by mouth daily. 15 tablet 2  ? torsemide (DEMADEX) 20 MG tablet Take 2 tablets (40 mg total) by mouth daily. (Patient not taking: Reported on 03/13/2022) 60 tablet 0  ? torsemide (DEMADEX) 20 MG tablet Take 2 tablets (40 mg total) by mouth daily. 60 tablet 2  ? ?No current facility-administered medications for this encounter.  ? ?Allergies  ?Allergen Reactions  ? Hydrocodone Itching  ? ?Social History  ? ?Socioeconomic History  ? Marital status: Single  ?  Spouse name: none  ? Number of children: Not on file  ? Years of education: Not on file  ? Highest education level: Not on file  ?Occupational History  ? Occupation: Disabled  ?Tobacco Use  ? Smoking status: Never  ? Smokeless tobacco: Never  ?Vaping Use  ? Vaping Use: Never used  ?Substance and Sexual Activity  ? Alcohol use: Yes  ?  Comment: 1 quarts a week  ? Drug use: Not Currently  ?  Frequency: 3.0 times per week  ?  Types: Marijuana, Cocaine  ? Sexual activity: Not Currently  ?Other Topics Concern  ? Not on file  ?Social History Narrative  ? Lives w/ mother in [place] but recently visiting w/ dtr in [place].  ? ?Social Determinants of Health  ? ?Financial Resource Strain: High Risk  ? Difficulty of Paying Living Expenses: Hard  ?Food Insecurity: Food Insecurity  Present  ? Worried About Charity fundraiser in the Last Year: Often true  ? Ran Out of Food in the Last Year: Often true  ?Transportation Needs: Unmet Transportation Needs  ? Lack of Transportation (Medical): Yes  ? Lack of Transportation (Non-Medical): Yes  ?Physical Activity: Not on file  ?Stress: Not on file  ?Social Connections: Not on file  ?Intimate Partner Violence: Not on file  ? ?Family History  ?Problem Relation Age of Onset  ? Hypertension Maternal Grandmother   ? ?BP 110/80   Pulse 99   Wt 110.3 kg (243 lb 3.2 oz)   SpO2 99%   BMI 33.92 kg/m?  ? ?Wt Readings from Last 3 Encounters:  ?03/13/22 110.3 kg (243 lb 3.2 oz)  ?02/22/22 109.8 kg (242 lb)  ?12/11/21 105.1 kg (231 lb 9.6 oz)  ? ?General: NAD ?Neck:  JVP 9-10 cm, no thyromegaly or thyroid nodule.  ?Lungs: Clear to auscultation bilaterally with normal respiratory effort. ?CV: Nondisplaced PMI.  Heart regular S1/S2, no S3/S4, no murmur.  1+ edema to knees.  No carotid bruit.  Unable to palpate pedal pulses.  ?Abdomen: Soft, nontender, no hepatosplenomegaly, no distention.  ?Skin: Intact without lesions or rashes.  ?Neurologic: Alert and oriented x 3.  ?Psych: Normal affect. ?Extremities: No clubbing or cyanosis.  ?HEENT: Normal.  ? ?ASSESSMENT & PLAN: ?1. 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 He has a history of alcohol and cocaine abuse, which likely contributes to cardiomyopathy.  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 Most recent echo in 4/23 showed EF 10-15%, global HK, severe LV dilation, severely enlarged RV with severely decreased systolic function, severe biatrial enlargement, moderate MR, dilated IVC.  He has been off all meds except Bidil for 5 days and is volume overloaded on exam with NYHA class III symptoms.  ?- Continue Bidil 0.5 tabs tid. ?- Restart torsemide 40 mg daily + KCl 20 daily, BMET/BNP today and BMET 10 days.  ?- Restart digoxin 0.125 daily.  ?- Restart spironolactone 12.5 daily.   ?-  Restart Farxiga 10 mg daily.  ?- Not candidate for ICD unless he can cut back on ETOH abuse.  He has stopped cocaine.  ?- I am concerned that worsened CAD could be contributing to cardiomyopathy. He had NSTEMI in 7/22.

## 2022-03-14 NOTE — H&P (View-Only) (Signed)
? ?ADVANCED HF CLINIC CONSULT NOTE ? ?PCP: None ?HF Cardiologist: Dr. Aundra Dubin ? ?HPI: ?Dale [name] is a 61 y.o. male  with a PMH significant for hypertension, polysubstance use (including cocaine & alcohol), chronic combined CHF, and CAD. ?  ?He was admitted 11/19 with signs/symptoms of CHF, in the setting of poorly controlled hypertension alcohol and cocaine use. He was started on IV Lasix and echo showed EF 20%, G2 DD.  He left AMA. ?  ?Readmitted 12/19 with CHF exacerbation, started on IV diuretics, beta-blocker, ARB and BiDil.  Discharged on oral torsemide.  He remained positive for cocaine on UDS. ?  ?Admitted 4/20 with chest pain and CHF. Diagnosed with NSTEMI. He declined cardiac catheterization.   ?  ?Admitted 5/20 with CHF and chest pain, still using cocaine, but no evidence of ischemia.  Diuresed and discharged. ? ?Echo (5/20) EF 20 to 25%, elevated LVEDP, diffuse hypokinesis with worse hypokinesis and inferior wall moderately enlarged RV with normal function. ? ?Admitted in 7/20 with acute CHF and underwent L/RHC showing severe single vessel CAD with CTO of pRCA, mild to moderate non-obs CAD involving LAD and LCx, moderately elevated left heart filling pressures, severely elevated right heart filling pressures, mild pulmonary hypertension and normal CO/CI by Fick. He was treated medically. ? ?2 CHF admissions 2021. 6 admissions in 2022.  ?  ?Admitted 7/22 with NSTEMI and troponin > 1000,  but treated medically due to continued cocaine use and concern for poor medication adherence if stent was needed. ?  ?Admitted 8/22 with CHF exacerbation.  Echo with EF <10%, medications restarted. Farxiga and spiro added.   ? ?He was seen in [facility] 9/22, doing well from CHF standpoint and compliant with medication regimen.  ? ?Admitted again in 4/23 with CHF, refused PICC line and refused RHC/LHC.  Concern for low output HF with AKI/creatinine up to 2.  Patient was started on milrinone empirically with good  diuresis, creatinine down to about 1.7. Echo this admission showed EF 10-15%, global HK, severe LV dilation, severely enlarged RV with severely decreased systolic function, severe biatrial enlargement, moderate MR, dilated IVC.  ?  ?Patient returns for followup of CHF.  He is not using cocaine or smoking cigarettes, but he is smoking marijuana and drinking heavily (1 quart liquor every 2 days or so).  He has been out of all his meds except for Bidil x 5 days.  He is currently living with a friend.  He feels worse off his meds.  He is short of breath walking short distances (20 feet).  No orthopnea/PND.  No chest pain.  Bilateral foot aching.  ? ?ECG (personally reviewed): NSR, LAFB, poor RWP, lateral TWIs.  ? ?Labs (9/22): K 3.9, creatinine 1.67 ?Labs (4/23): K 3.6, creatinine 1.69, hgb 12.8, LDL 87 ? ?Review of Systems:  All systems reviewed and negative except as per HPI.  ? ?PMH: ?1. ETOH abuse.  ?2. Cocaine abuse: Has been off cocaine x months.  ?3. CAD: Cath in 7/20 with totally occluded RCA with collaterals.  ?- NSTEMI in 7/22 with HS-TnI > 1000 but no cath.  ?4. CKD stage 3 ?5. HTN ?6. Hyperlipidemia ?7. Chronic systolic CHF: Suspect mixed ischemic/nonischemic cardiomyopathy.  See cath report as above from 7/20.  He continues to drink ETOH heavily, used cocaine in the past.  Both could contribute.  ?- Echo (4/23): EF 10-15%, global HK, severe LV dilation, severely enlarged RV with severely decreased systolic function, severe biatrial enlargement, moderate MR, dilated IVC ? ? ?  Current Outpatient Medications  ?Medication Sig Dispense Refill  ? diclofenac sodium (VOLTAREN) 1 % GEL Apply 2 g topically daily as needed (pain).    ? aspirin 81 MG EC tablet Take 1 tablet (81 mg total) by mouth daily. Swallow whole. 30 tablet 2  ? atorvastatin (LIPITOR) 40 MG tablet Take 1 tablet (40 mg total) by mouth daily. 30 tablet 2  ? dapagliflozin propanediol (FARXIGA) 10 MG TABS tablet Take 1 tablet (10 mg total) by mouth  daily. (Patient not taking: Reported on 03/13/2022) 30 tablet 0  ? dapagliflozin propanediol (FARXIGA) 10 MG TABS tablet Take 1 tablet (10 mg total) by mouth daily. 30 tablet 2  ? digoxin (LANOXIN) 0.125 MG tablet Take 1 tablet (0.125 mg total) by mouth daily. (Patient not taking: Reported on 03/13/2022) 30 tablet 0  ? digoxin (LANOXIN) 0.125 MG tablet Take 1 tablet (0.125 mg total) by mouth daily. 30 tablet 2  ? isosorbide-hydrALAZINE (BIDIL) 20-37.5 MG tablet Take 0.5 tablets by mouth 3 (three) times daily. 90 tablet 2  ? potassium chloride SA (KLOR-CON M) 20 MEQ tablet Take 1 tablet (20 mEq total) by mouth daily. 30 tablet 2  ? spironolactone (ALDACTONE) 25 MG tablet Take 0.5 tablets (12.5 mg total) by mouth daily. 15 tablet 2  ? torsemide (DEMADEX) 20 MG tablet Take 2 tablets (40 mg total) by mouth daily. (Patient not taking: Reported on 03/13/2022) 60 tablet 0  ? torsemide (DEMADEX) 20 MG tablet Take 2 tablets (40 mg total) by mouth daily. 60 tablet 2  ? ?No current facility-administered medications for this encounter.  ? ?Allergies  ?Allergen Reactions  ? Hydrocodone Itching  ? ?Social History  ? ?Socioeconomic History  ? Marital status: Single  ?  Spouse name: none  ? Number of children: Not on file  ? Years of education: Not on file  ? Highest education level: Not on file  ?Occupational History  ? Occupation: Disabled  ?Tobacco Use  ? Smoking status: Never  ? Smokeless tobacco: Never  ?Vaping Use  ? Vaping Use: Never used  ?Substance and Sexual Activity  ? Alcohol use: Yes  ?  Comment: 1 quarts a week  ? Drug use: Not Currently  ?  Frequency: 3.0 times per week  ?  Types: Marijuana, Cocaine  ? Sexual activity: Not Currently  ?Other Topics Concern  ? Not on file  ?Social History Narrative  ? Lives w/ mother in [place] but recently visiting w/ dtr in [place].  ? ?Social Determinants of Health  ? ?Financial Resource Strain: High Risk  ? Difficulty of Paying Living Expenses: Hard  ?Food Insecurity: Food Insecurity  Present  ? Worried About Charity fundraiser in the Last Year: Often true  ? Ran Out of Food in the Last Year: Often true  ?Transportation Needs: Unmet Transportation Needs  ? Lack of Transportation (Medical): Yes  ? Lack of Transportation (Non-Medical): Yes  ?Physical Activity: Not on file  ?Stress: Not on file  ?Social Connections: Not on file  ?Intimate Partner Violence: Not on file  ? ?Family History  ?Problem Relation Age of Onset  ? Hypertension Maternal Grandmother   ? ?BP 110/80   Pulse 99   Wt 110.3 kg (243 lb 3.2 oz)   SpO2 99%   BMI 33.92 kg/m?  ? ?Wt Readings from Last 3 Encounters:  ?03/13/22 110.3 kg (243 lb 3.2 oz)  ?02/22/22 109.8 kg (242 lb)  ?12/11/21 105.1 kg (231 lb 9.6 oz)  ? ?General: NAD ?Neck:  JVP 9-10 cm, no thyromegaly or thyroid nodule.  ?Lungs: Clear to auscultation bilaterally with normal respiratory effort. ?CV: Nondisplaced PMI.  Heart regular S1/S2, no S3/S4, no murmur.  1+ edema to knees.  No carotid bruit.  Unable to palpate pedal pulses.  ?Abdomen: Soft, nontender, no hepatosplenomegaly, no distention.  ?Skin: Intact without lesions or rashes.  ?Neurologic: Alert and oriented x 3.  ?Psych: Normal affect. ?Extremities: No clubbing or cyanosis.  ?HEENT: Normal.  ? ?ASSESSMENT & PLAN: ?1. 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 He has a history of alcohol and cocaine abuse, which likely contributes to cardiomyopathy.  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 Most recent echo in 4/23 showed EF 10-15%, global HK, severe LV dilation, severely enlarged RV with severely decreased systolic function, severe biatrial enlargement, moderate MR, dilated IVC.  He has been off all meds except Bidil for 5 days and is volume overloaded on exam with NYHA class III symptoms.  ?- Continue Bidil 0.5 tabs tid. ?- Restart torsemide 40 mg daily + KCl 20 daily, BMET/BNP today and BMET 10 days.  ?- Restart digoxin 0.125 daily.  ?- Restart spironolactone 12.5 daily.   ?-  Restart Farxiga 10 mg daily.  ?- Not candidate for ICD unless he can cut back on ETOH abuse.  He has stopped cocaine.  ?- I am concerned that worsened CAD could be contributing to cardiomyopathy. He had NSTEMI in 7/22.

## 2022-03-18 ENCOUNTER — Inpatient Hospital Stay (HOSPITAL_COMMUNITY): Payer: Medicaid Other

## 2022-03-18 ENCOUNTER — Encounter (HOSPITAL_COMMUNITY)
Admission: RE | Disposition: A | Discharge status: Home / Self Care | Payer: Self-pay | Source: Home / Self Care | Attending: Cardiology

## 2022-03-18 ENCOUNTER — Inpatient Hospital Stay: Payer: Self-pay

## 2022-03-18 ENCOUNTER — Inpatient Hospital Stay (HOSPITAL_COMMUNITY)
Admission: RE | Admit: 2022-03-18 | Discharge: 2022-03-22 | DRG: 286 | Disposition: A | Discharge status: Home / Self Care | Payer: Medicaid Other | Attending: Cardiology | Admitting: Cardiology

## 2022-03-18 DIAGNOSIS — I5023 Acute on chronic systolic (congestive) heart failure: Secondary | ICD-10-CM | POA: Diagnosis present

## 2022-03-18 DIAGNOSIS — F1290 Cannabis use, unspecified, uncomplicated: Secondary | ICD-10-CM | POA: Diagnosis present

## 2022-03-18 DIAGNOSIS — Z91148 Patient's other noncompliance with medication regimen for other reason: Secondary | ICD-10-CM

## 2022-03-18 DIAGNOSIS — N183 Chronic kidney disease, stage 3 unspecified: Secondary | ICD-10-CM | POA: Diagnosis not present

## 2022-03-18 DIAGNOSIS — I2510 Atherosclerotic heart disease of native coronary artery without angina pectoris: Secondary | ICD-10-CM | POA: Diagnosis present

## 2022-03-18 DIAGNOSIS — I272 Pulmonary hypertension, unspecified: Secondary | ICD-10-CM | POA: Diagnosis present

## 2022-03-18 DIAGNOSIS — F1419 Cocaine abuse with unspecified cocaine-induced disorder: Secondary | ICD-10-CM | POA: Diagnosis not present

## 2022-03-18 DIAGNOSIS — I428 Other cardiomyopathies: Secondary | ICD-10-CM | POA: Diagnosis present

## 2022-03-18 DIAGNOSIS — R252 Cramp and spasm: Secondary | ICD-10-CM | POA: Diagnosis not present

## 2022-03-18 DIAGNOSIS — Z5900 Homelessness unspecified: Secondary | ICD-10-CM

## 2022-03-18 DIAGNOSIS — I2582 Chronic total occlusion of coronary artery: Secondary | ICD-10-CM | POA: Diagnosis present

## 2022-03-18 DIAGNOSIS — I959 Hypotension, unspecified: Secondary | ICD-10-CM | POA: Diagnosis not present

## 2022-03-18 DIAGNOSIS — Z79899 Other long term (current) drug therapy: Secondary | ICD-10-CM

## 2022-03-18 DIAGNOSIS — F1010 Alcohol abuse, uncomplicated: Secondary | ICD-10-CM | POA: Diagnosis present

## 2022-03-18 DIAGNOSIS — E669 Obesity, unspecified: Secondary | ICD-10-CM | POA: Diagnosis present

## 2022-03-18 DIAGNOSIS — N1831 Chronic kidney disease, stage 3a: Secondary | ICD-10-CM | POA: Diagnosis present

## 2022-03-18 DIAGNOSIS — I130 Hypertensive heart and chronic kidney disease with heart failure and stage 1 through stage 4 chronic kidney disease, or unspecified chronic kidney disease: Principal | ICD-10-CM | POA: Diagnosis present

## 2022-03-18 DIAGNOSIS — F1411 Cocaine abuse, in remission: Secondary | ICD-10-CM | POA: Diagnosis present

## 2022-03-18 DIAGNOSIS — F1019 Alcohol abuse with unspecified alcohol-induced disorder: Secondary | ICD-10-CM | POA: Diagnosis not present

## 2022-03-18 DIAGNOSIS — Z885 Allergy status to narcotic agent status: Secondary | ICD-10-CM | POA: Diagnosis not present

## 2022-03-18 DIAGNOSIS — Z6833 Body mass index (BMI) 33.0-33.9, adult: Secondary | ICD-10-CM

## 2022-03-18 DIAGNOSIS — E876 Hypokalemia: Secondary | ICD-10-CM | POA: Diagnosis not present

## 2022-03-18 DIAGNOSIS — I252 Old myocardial infarction: Secondary | ICD-10-CM

## 2022-03-18 DIAGNOSIS — E785 Hyperlipidemia, unspecified: Secondary | ICD-10-CM | POA: Diagnosis present

## 2022-03-18 DIAGNOSIS — Z8249 Family history of ischemic heart disease and other diseases of the circulatory system: Secondary | ICD-10-CM | POA: Diagnosis not present

## 2022-03-18 DIAGNOSIS — I5043 Acute on chronic combined systolic (congestive) and diastolic (congestive) heart failure: Secondary | ICD-10-CM | POA: Diagnosis present

## 2022-03-18 DIAGNOSIS — Z7982 Long term (current) use of aspirin: Secondary | ICD-10-CM

## 2022-03-18 DIAGNOSIS — N179 Acute kidney failure, unspecified: Secondary | ICD-10-CM | POA: Diagnosis not present

## 2022-03-18 HISTORY — PX: RIGHT/LEFT HEART CATH AND CORONARY ANGIOGRAPHY: CATH118266

## 2022-03-18 LAB — COMPREHENSIVE METABOLIC PANEL
ALT: 21 U/L (ref 0–44)
AST: 26 U/L (ref 15–41)
Albumin: 3.4 g/dL — ABNORMAL LOW (ref 3.5–5.0)
Alkaline Phosphatase: 88 U/L (ref 38–126)
Anion gap: 8 (ref 5–15)
BUN: 28 mg/dL — ABNORMAL HIGH (ref 6–20)
CO2: 25 mmol/L (ref 22–32)
Calcium: 8.5 mg/dL — ABNORMAL LOW (ref 8.9–10.3)
Chloride: 105 mmol/L (ref 98–111)
Creatinine, Ser: 1.5 mg/dL — ABNORMAL HIGH (ref 0.61–1.24)
GFR, Estimated: 54 mL/min — ABNORMAL LOW (ref >60)
Glucose, Bld: 141 mg/dL — ABNORMAL HIGH (ref 70–99)
Potassium: 3.4 mmol/L — ABNORMAL LOW (ref 3.5–5.1)
Sodium: 138 mmol/L (ref 135–145)
Total Bilirubin: 0.7 mg/dL (ref 0.3–1.2)
Total Protein: 6.8 g/dL (ref 6.5–8.1)

## 2022-03-18 LAB — POCT I-STAT EG7
Acid-base deficit: 1 mmol/L (ref 0.0–2.0)
Acid-base deficit: 2 mmol/L (ref 0.0–2.0)
Bicarbonate: 23.4 mmol/L (ref 20.0–28.0)
Bicarbonate: 23.7 mmol/L (ref 20.0–28.0)
Calcium, Ion: 1.16 mmol/L (ref 1.15–1.40)
Calcium, Ion: 1.19 mmol/L (ref 1.15–1.40)
HCT: 38 % — ABNORMAL LOW (ref 39.0–52.0)
HCT: 38 % — ABNORMAL LOW (ref 39.0–52.0)
Hemoglobin: 12.9 g/dL — ABNORMAL LOW (ref 13.0–17.0)
Hemoglobin: 12.9 g/dL — ABNORMAL LOW (ref 13.0–17.0)
O2 Saturation: 47 %
O2 Saturation: 47 %
Potassium: 3.5 mmol/L (ref 3.5–5.1)
Potassium: 3.6 mmol/L (ref 3.5–5.1)
Sodium: 141 mmol/L (ref 135–145)
Sodium: 141 mmol/L (ref 135–145)
TCO2: 25 mmol/L (ref 22–32)
TCO2: 25 mmol/L (ref 22–32)
pCO2, Ven: 40.2 mmHg — ABNORMAL LOW (ref 44–60)
pCO2, Ven: 40.2 mmHg — ABNORMAL LOW (ref 44–60)
pH, Ven: 7.374 (ref 7.25–7.43)
pH, Ven: 7.379 (ref 7.25–7.43)
pO2, Ven: 26 mmHg — CL (ref 32–45)
pO2, Ven: 26 mmHg — CL (ref 32–45)

## 2022-03-18 LAB — BRAIN NATRIURETIC PEPTIDE: B Natriuretic Peptide: 2105.2 pg/mL — ABNORMAL HIGH (ref 0.0–100.0)

## 2022-03-18 SURGERY — RIGHT/LEFT HEART CATH AND CORONARY ANGIOGRAPHY
Anesthesia: LOCAL

## 2022-03-18 MED ORDER — FUROSEMIDE 10 MG/ML IJ SOLN
12.0000 mg/h | INTRAVENOUS | Status: DC
  Administered 2022-03-18 – 2022-03-20 (×3): 12 mg/h via INTRAVENOUS
  Filled 2022-03-18 (×3): qty 20

## 2022-03-18 MED ORDER — MIDAZOLAM HCL 2 MG/2ML IJ SOLN
INTRAMUSCULAR | Status: AC
  Filled 2022-03-18: qty 2

## 2022-03-18 MED ORDER — POTASSIUM CHLORIDE CRYS ER 20 MEQ PO TBCR
40.0000 meq | EXTENDED_RELEASE_TABLET | Freq: Once | ORAL | Status: AC
  Administered 2022-03-19: 40 meq via ORAL
  Filled 2022-03-18: qty 2

## 2022-03-18 MED ORDER — FENTANYL CITRATE (PF) 100 MCG/2ML IJ SOLN
INTRAMUSCULAR | Status: DC | PRN
  Administered 2022-03-18: 12.5 ug via INTRAVENOUS

## 2022-03-18 MED ORDER — ENOXAPARIN SODIUM 40 MG/0.4ML IJ SOSY: 40.0000 mg | PREFILLED_SYRINGE | INTRAMUSCULAR | Status: DC

## 2022-03-18 MED ORDER — LABETALOL HCL 5 MG/ML IV SOLN: 10.0000 mg | INTRAVENOUS | Status: AC | PRN

## 2022-03-18 MED ORDER — SODIUM CHLORIDE 0.9 % IV SOLN: 250.0000 mL | INTRAVENOUS | Status: DC | PRN

## 2022-03-18 MED ORDER — ENOXAPARIN SODIUM 40 MG/0.4ML IJ SOSY
40.0000 mg | PREFILLED_SYRINGE | INTRAMUSCULAR | Status: DC
  Filled 2022-03-18: qty 0.4

## 2022-03-18 MED ORDER — HEPARIN SODIUM (PORCINE) 1000 UNIT/ML IJ SOLN
INTRAMUSCULAR | Status: AC
  Filled 2022-03-18: qty 10

## 2022-03-18 MED ORDER — SODIUM CHLORIDE 0.9% FLUSH: 3.0000 mL | INTRAVENOUS | Status: DC | PRN

## 2022-03-18 MED ORDER — FUROSEMIDE 10 MG/ML IJ SOLN
80.0000 mg | Freq: Once | INTRAMUSCULAR | Status: AC
  Administered 2022-03-18: 80 mg via INTRAVENOUS
  Filled 2022-03-18: qty 8

## 2022-03-18 MED ORDER — VERAPAMIL HCL 2.5 MG/ML IV SOLN: INTRAVENOUS | Status: DC | PRN

## 2022-03-18 MED ORDER — ONDANSETRON HCL 4 MG/2ML IJ SOLN
4.0000 mg | Freq: Four times a day (QID) | INTRAMUSCULAR | Status: DC | PRN
  Administered 2022-03-20 – 2022-03-21 (×3): 4 mg via INTRAVENOUS
  Filled 2022-03-18 (×3): qty 2

## 2022-03-18 MED ORDER — HEPARIN SODIUM (PORCINE) 1000 UNIT/ML IJ SOLN
INTRAMUSCULAR | Status: DC | PRN
Start: 2022-03-18 — End: 2022-03-18
  Administered 2022-03-18: 5000 [IU] via INTRAVENOUS

## 2022-03-18 MED ORDER — DIGOXIN 125 MCG PO TABS
0.1250 mg | ORAL_TABLET | Freq: Every day | ORAL | Status: DC
  Administered 2022-03-18 – 2022-03-21 (×4): 0.125 mg via ORAL
  Filled 2022-03-18 (×4): qty 1

## 2022-03-18 MED ORDER — HEPARIN (PORCINE) IN NACL 1000-0.9 UT/500ML-% IV SOLN
INTRAVENOUS | Status: AC
  Filled 2022-03-18: qty 1000

## 2022-03-18 MED ORDER — ASPIRIN 81 MG PO TBEC
81.0000 mg | DELAYED_RELEASE_TABLET | Freq: Every day | ORAL | Status: DC
  Administered 2022-03-18 – 2022-03-22 (×5): 81 mg via ORAL
  Filled 2022-03-18 (×5): qty 1

## 2022-03-18 MED ORDER — SPIRONOLACTONE 12.5 MG HALF TABLET
12.5000 mg | ORAL_TABLET | Freq: Every day | ORAL | Status: DC
  Administered 2022-03-18: 12.5 mg via ORAL
  Filled 2022-03-18: qty 1

## 2022-03-18 MED ORDER — SODIUM CHLORIDE 0.9% FLUSH
3.0000 mL | Freq: Two times a day (BID) | INTRAVENOUS | Status: DC
  Administered 2022-03-18 – 2022-03-20 (×2): 3 mL via INTRAVENOUS

## 2022-03-18 MED ORDER — HYDRALAZINE HCL 20 MG/ML IJ SOLN: 10.0000 mg | INTRAMUSCULAR | Status: AC | PRN

## 2022-03-18 MED ORDER — FUROSEMIDE 10 MG/ML IJ SOLN: 80.0000 mg | Freq: Two times a day (BID) | INTRAMUSCULAR | Status: DC

## 2022-03-18 MED ORDER — MIDAZOLAM HCL 2 MG/2ML IJ SOLN
INTRAMUSCULAR | Status: DC | PRN
  Administered 2022-03-18: .5 mg via INTRAVENOUS

## 2022-03-18 MED ORDER — VERAPAMIL HCL 2.5 MG/ML IV SOLN
INTRAVENOUS | Status: AC
  Filled 2022-03-18: qty 2

## 2022-03-18 MED ORDER — ATORVASTATIN CALCIUM 40 MG PO TABS
40.0000 mg | ORAL_TABLET | Freq: Every day | ORAL | Status: DC
  Administered 2022-03-18 – 2022-03-22 (×5): 40 mg via ORAL
  Filled 2022-03-18 (×5): qty 1

## 2022-03-18 MED ORDER — HEPARIN (PORCINE) IN NACL 1000-0.9 UT/500ML-% IV SOLN
INTRAVENOUS | Status: DC | PRN
  Administered 2022-03-18 (×2): 500 mL

## 2022-03-18 MED ORDER — SODIUM CHLORIDE 0.9 % IV SOLN: INTRAVENOUS | Status: DC

## 2022-03-18 MED ORDER — ISOSORB DINITRATE-HYDRALAZINE 20-37.5 MG PO TABS
0.5000 | ORAL_TABLET | Freq: Three times a day (TID) | ORAL | Status: DC
  Administered 2022-03-18 – 2022-03-20 (×8): 0.5 via ORAL
  Filled 2022-03-18 (×8): qty 1

## 2022-03-18 MED ORDER — FENTANYL CITRATE (PF) 100 MCG/2ML IJ SOLN
INTRAMUSCULAR | Status: AC
  Filled 2022-03-18: qty 2

## 2022-03-18 MED ORDER — ACETAMINOPHEN 325 MG PO TABS: 650.0000 mg | ORAL_TABLET | ORAL | Status: DC | PRN

## 2022-03-18 MED ORDER — IOHEXOL 350 MG/ML SOLN
INTRAVENOUS | Status: DC | PRN
Start: 2022-03-18 — End: 2022-03-18
  Administered 2022-03-18: 40 mL

## 2022-03-18 MED ORDER — MILRINONE LACTATE IN DEXTROSE 20-5 MG/100ML-% IV SOLN
0.1250 ug/kg/min | INTRAVENOUS | Status: DC
Start: 2022-03-18 — End: 2022-03-21
  Administered 2022-03-18 – 2022-03-19 (×4): 0.25 ug/kg/min via INTRAVENOUS
  Administered 2022-03-20: 0.125 ug/kg/min via INTRAVENOUS
  Administered 2022-03-20: 0.25 ug/kg/min via INTRAVENOUS
  Filled 2022-03-18 (×6): qty 100

## 2022-03-18 MED ORDER — SODIUM CHLORIDE 0.9% FLUSH
3.0000 mL | Freq: Two times a day (BID) | INTRAVENOUS | Status: DC
  Administered 2022-03-20: 3 mL via INTRAVENOUS

## 2022-03-18 MED ORDER — ASPIRIN 81 MG PO CHEW
81.0000 mg | CHEWABLE_TABLET | ORAL | Status: AC
  Administered 2022-03-18: 81 mg via ORAL

## 2022-03-18 MED ORDER — LIDOCAINE HCL (PF) 1 % IJ SOLN
INTRAMUSCULAR | Status: AC
  Filled 2022-03-18: qty 30

## 2022-03-18 MED ORDER — ASPIRIN 81 MG PO CHEW
CHEWABLE_TABLET | ORAL | Status: AC
  Filled 2022-03-18: qty 1

## 2022-03-18 MED ORDER — DAPAGLIFLOZIN PROPANEDIOL 10 MG PO TABS
10.0000 mg | ORAL_TABLET | Freq: Every day | ORAL | Status: DC
  Administered 2022-03-18 – 2022-03-20 (×3): 10 mg via ORAL
  Filled 2022-03-18 (×4): qty 1

## 2022-03-18 SURGICAL SUPPLY — 11 items
CATH 5FR JL3.5 JR4 ANG PIG MP (CATHETERS) ×1 IMPLANT
CATH BALLN WEDGE 5F 110CM (CATHETERS) ×1 IMPLANT
DEVICE RAD COMP TR BAND LRG (VASCULAR PRODUCTS) ×1 IMPLANT
GLIDESHEATH SLEND SS 6F .021 (SHEATH) ×1 IMPLANT
GUIDEWIRE INQWIRE 1.5J.035X260 (WIRE) IMPLANT
INQWIRE 1.5J .035X260CM (WIRE) ×2
KIT HEART LEFT (KITS) ×2 IMPLANT
PACK CARDIAC CATHETERIZATION (CUSTOM PROCEDURE TRAY) ×2 IMPLANT
SHEATH GLIDE SLENDER 4/5FR (SHEATH) ×1 IMPLANT
TRANSDUCER W/STOPCOCK (MISCELLANEOUS) ×2 IMPLANT
WIRE EMERALD 3MM-J .025X260CM (WIRE) ×1 IMPLANT

## 2022-03-18 NOTE — H&P (Signed)
? ?Advanced Heart Failure Team History and Physical Note ?  ?PCP:  Nahser, Wonda Cheng, MD  ?PCP-Cardiology: Mertie Moores, MD    ? ?Reason for Admission: CHF ? ? ?HPI:   ? ?Dale [name] is a 61 y.o. male  with a PMH significant for hypertension, polysubstance use (including cocaine & alcohol), chronic combined CHF, and CAD. ?  ?He was admitted 11/19 with signs/symptoms of CHF, in the setting of poorly controlled hypertension alcohol and cocaine use. He was started on IV Lasix and echo showed EF 20%, G2 DD.  He left AMA. ?  ?Readmitted 12/19 with CHF exacerbation, started on IV diuretics, beta-blocker, ARB and BiDil.  Discharged on oral torsemide.  He remained positive for cocaine on UDS. ?  ?Admitted 4/20 with chest pain and CHF. Diagnosed with NSTEMI. He declined cardiac catheterization.   ?  ?Admitted 5/20 with CHF and chest pain, still using cocaine, but no evidence of ischemia.  Diuresed and discharged. ?  ?Echo (5/20) EF 20 to 25%, elevated LVEDP, diffuse hypokinesis with worse hypokinesis and inferior wall moderately enlarged RV with normal function. ?  ?Admitted in 7/20 with acute CHF and underwent L/RHC showing severe single vessel CAD with CTO of pRCA, mild to moderate non-obs CAD involving LAD and LCx, moderately elevated left heart filling pressures, severely elevated right heart filling pressures, mild pulmonary hypertension and normal CO/CI by Fick. He was treated medically. ?  ?2 CHF admissions 2021. 6 admissions in 2022.  ?  ?Admitted 7/22 with NSTEMI and troponin > 1000,  but treated medically due to continued cocaine use and concern for poor medication adherence if stent was needed. ?  ?Admitted 8/22 with CHF exacerbation.  Echo with EF <10%, medications restarted. Farxiga and spiro added.   ?   ?Admitted again in 4/23 with CHF, refused PICC line and refused RHC/LHC.  Concern for low output HF with AKI/creatinine up to 2.  Patient was started on milrinone empirically with good diuresis,  creatinine down to about 1.7. Echo this admission showed EF 10-15%, global HK, severe LV dilation, severely enlarged RV with severely decreased systolic function, severe biatrial enlargement, moderate MR, dilated IVC.  ?  ?At last visit to CHF clinic in 5/23, he had been out of all meds except Bidil x 5 days and was significantly volume overloaded. He is not using cocaine or smoking cigarettes, but he has been smoking marijuana and drinking heavily (1 quart liquor every 2 days or so).  He is currently living with a friend.  He has been short of breath with most exertion and orthopneic ,no chest pain.  He came today for outpatient RHC/LHC and is being admitted with elevated filling pressures and low output.   ? ?RHC/LHC today: ?Coronary Findings ? ?Diagnostic ?Dominance: Right ?Left Main  ?Vessel is large. Vessel is angiographically normal.  ?  ?Left Anterior Descending  ?Prox LAD lesion is 15% stenosed.  ?Mid LAD lesion is 40% stenosed.  ?  ?First Diagonal Branch  ?Vessel is small in size.  ?  ?Second Diagonal Branch  ?Vessel is moderate in size. There is mild disease in the vessel.  ?  ?Third Diagonal Branch  ?Vessel is small in size.  ?  ?Left Circumflex  ?Vessel is large.  ?  ?First Obtuse Marginal Branch  ?Vessel is small in size.  ?  ?Second Obtuse Marginal Branch  ?Vessel is large in size.  ?2nd Mrg lesion is 30% stenosed.  ?  ?Third Obtuse Marginal Branch  ?  Vessel is large in size.  ?3rd Mrg lesion is 20% stenosed.  ?  ?Lateral Third Obtuse Marginal Branch  ?Vessel is small in size.  ?  ?Right Coronary Artery  ?Vessel is moderate in size.  ?Prox RCA lesion is 100% stenosed. The lesion is chronically occluded with bridging and left-to-right collateral flow.  ?  ?Right Posterior Descending Artery  ?Collaterals  ?RPDA filled by collaterals from Dist Cx.  ?  ?  ?Intervention ?  ?No interventions have been documented.  ? ?Right Heart ? ?Right Heart Pressures RHC Procedural Findings: ?Hemodynamics (mmHg) ?RA mean  23 ?RV 50/21 ?PA 53/32, mean 40 ?PCWP mean 39 ?LV 117/35 ?AO 117/82 ? ?Oxygen saturations: ?PA 47% ?AO 97% ? ?Cardiac Output (Fick) 3.5  ?Cardiac Index (Fick) 1.53 ?PVR < 1 WU  ? ? ?Review of Systems: All systems reviewed and negative except as per HPI.  ? ?Home Medications ?Prior to Admission medications   ?Medication Sig Start Date End Date Taking? Authorizing Provider  ?aspirin 81 MG EC tablet Take 1 tablet (81 mg total) by mouth daily. Swallow whole. 03/13/22  Yes Larey Dresser, MD  ?atorvastatin (LIPITOR) 40 MG tablet Take 1 tablet (40 mg total) by mouth daily. 03/13/22  Yes Larey Dresser, MD  ?dapagliflozin propanediol (FARXIGA) 10 MG TABS tablet Take 1 tablet (10 mg total) by mouth daily. 03/13/22  Yes Larey Dresser, MD  ?diclofenac sodium (VOLTAREN) 1 % GEL Apply 2 g topically daily as needed (pain).   Yes [provider]  ?digoxin (LANOXIN) 0.125 MG tablet Take 1 tablet (0.125 mg total) by mouth daily. 03/13/22  Yes Larey Dresser, MD  ?isosorbide-hydrALAZINE (BIDIL) 20-37.5 MG tablet Take 0.5 tablets by mouth 3 (three) times daily. 03/13/22  Yes Larey Dresser, MD  ?potassium chloride SA (KLOR-CON M) 20 MEQ tablet Take 1 tablet (20 mEq total) by mouth daily. 03/13/22  Yes Larey Dresser, MD  ?spironolactone (ALDACTONE) 25 MG tablet Take 0.5 tablets (12.5 mg total) by mouth daily. 03/13/22  Yes Larey Dresser, MD  ?torsemide (DEMADEX) 20 MG tablet Take 2 tablets (40 mg total) by mouth daily. 03/13/22  Yes Larey Dresser, MD  ? ? ?Past Medical History: ?1. ETOH abuse.  ?2. Cocaine abuse: Has been off cocaine x months.  ?3. CAD: Cath in 7/20 with totally occluded RCA with collaterals.  ?- NSTEMI in 7/22 with HS-TnI > 1000 but no cath.  ?4. CKD stage 3 ?5. HTN ?6. Hyperlipidemia ?7. Chronic systolic CHF: Suspect mixed ischemic/nonischemic cardiomyopathy.  See cath report as above from 7/20.  He continues to drink ETOH heavily, used cocaine in the past.  Both could contribute.  ?- Echo  (4/23): EF 10-15%, global HK, severe LV dilation, severely enlarged RV with severely decreased systolic function, severe biatrial enlargement, moderate MR, dilated IVC ? ?Past Surgical History: ?Past Surgical History:  ?Procedure Laterality Date  ? RIGHT/LEFT HEART CATH AND CORONARY ANGIOGRAPHY N/A 05/10/2019  ? Procedure: RIGHT/LEFT HEART CATH AND CORONARY ANGIOGRAPHY;  Surgeon: Nelva Bush, MD;  Location: [facility];  Service: Cardiovascular;  Laterality: N/A;  ? ? ?Family History:  ?Family History  ?Problem Relation Age of Onset  ? Hypertension Maternal Grandmother   ? ? ?Social History: ?Social History  ? ?Socioeconomic History  ? Marital status: Single  ?  Spouse name: none  ? Number of children: Not on file  ? Years of education: Not on file  ? Highest education level: Not on file  ?  Occupational History  ? Occupation: Disabled  ?Tobacco Use  ? Smoking status: Never  ? Smokeless tobacco: Never  ?Vaping Use  ? Vaping Use: Never used  ?Substance and Sexual Activity  ? Alcohol use: Yes  ?  Comment: 1 quarts a week  ? Drug use: Not Currently  ?  Frequency: 3.0 times per week  ?  Types: Marijuana, Cocaine  ? Sexual activity: Not Currently  ?Other Topics Concern  ? Not on file  ?Social History Narrative  ? Lives w/ mother in [place] but recently visiting w/ dtr in [place].  ? ?Social Determinants of Health  ? ?Financial Resource Strain: High Risk  ? Difficulty of Paying Living Expenses: Hard  ?Food Insecurity: Food Insecurity Present  ? Worried About Charity fundraiser in the Last Year: Often true  ? Ran Out of Food in the Last Year: Often true  ?Transportation Needs: Unmet Transportation Needs  ? Lack of Transportation (Medical): Yes  ? Lack of Transportation (Non-Medical): Yes  ?Physical Activity: Not on file  ?Stress: Not on file  ?Social Connections: Not on file  ? ? ?Allergies:  ?Allergies  ?Allergen Reactions  ? Hydrocodone Itching  ? ? ?Objective:   ? ?Vital Signs:   ?Temp:  [98.1 ?F (36.7 ?C)] 98.1 ?F  (36.7 ?C) (05/15 0957) ?Pulse Rate:  [0-91] 79 (05/15 1310) ?Resp:  [11-31] 25 (05/15 1310) ?BP: (120-139)/(91-106) 127/101 (05/15 1310) ?SpO2:  [90 %-98 %] 96 % (05/15 1310) ?Weight:  [109.8 kg] 109.8 kg (05/1

## 2022-03-18 NOTE — Progress Notes (Signed)
PICC consult: Risks and benefits reviewed, pt declined PICC insertion. "I don't want all that up in me." He asserts that he's getting the medication he needs so doesn't see the need for PICC. Recommended pt discuss with MD in the morning. Discussed with RN: She will attempt to reinforce teaching. ?

## 2022-03-18 NOTE — Interval H&P Note (Signed)
History and Physical Interval Note: ? ?03/18/2022 ?11:47 AM ? ?[name]  has presented today for surgery, with the diagnosis of chf.  The various methods of treatment have been discussed with the patient and family. After consideration of risks, benefits and other options for treatment, the patient has consented to  Procedure(s): ?RIGHT/LEFT HEART CATH AND CORONARY ANGIOGRAPHY (N/A) as a surgical intervention.  The patient's history has been reviewed, patient examined, no change in status, stable for surgery.  I have reviewed the patient's chart and labs.  Questions were answered to the patient's satisfaction.   ? ? ?[name] [name] ? ? ?

## 2022-03-18 NOTE — Progress Notes (Signed)
Heart Failure Navigator Progress Note ? ?Assessed for Heart & Vascular TOC clinic readiness.  ?Patient does not meet criteria due to a patient with the Advance Heart Failure Team.  ? ? ? ?Tani Virgo, BSN, RN ?Heart Failure Nurse Navigator ?Secure Chat Only   ?

## 2022-03-18 NOTE — Progress Notes (Signed)
Spoke with IV team, Lyla Son, Aware of pts PICC line need, states it will be done today, but unsure of time, 3E floor RN to be made aware, safety maintained, IV Milrinone gtt started 1st and per order per cath lab RN, Jonny Ruiz ?

## 2022-03-19 ENCOUNTER — Encounter (HOSPITAL_COMMUNITY): Payer: Self-pay | Admitting: Cardiology

## 2022-03-19 ENCOUNTER — Inpatient Hospital Stay: Payer: Self-pay

## 2022-03-19 DIAGNOSIS — I5023 Acute on chronic systolic (congestive) heart failure: Secondary | ICD-10-CM | POA: Diagnosis not present

## 2022-03-19 LAB — BASIC METABOLIC PANEL
Anion gap: 10 (ref 5–15)
BUN: 25 mg/dL — ABNORMAL HIGH (ref 6–20)
CO2: 25 mmol/L (ref 22–32)
Calcium: 8.6 mg/dL — ABNORMAL LOW (ref 8.9–10.3)
Chloride: 102 mmol/L (ref 98–111)
Creatinine, Ser: 1.44 mg/dL — ABNORMAL HIGH (ref 0.61–1.24)
GFR, Estimated: 56 mL/min — ABNORMAL LOW (ref >60)
Glucose, Bld: 118 mg/dL — ABNORMAL HIGH (ref 70–99)
Potassium: 3.1 mmol/L — ABNORMAL LOW (ref 3.5–5.1)
Sodium: 137 mmol/L (ref 135–145)

## 2022-03-19 MED ORDER — SODIUM CHLORIDE 0.9% FLUSH: 10.0000 mL | INTRAVENOUS | Status: DC | PRN

## 2022-03-19 MED ORDER — SODIUM CHLORIDE 0.9% FLUSH
10.0000 mL | Freq: Two times a day (BID) | INTRAVENOUS | Status: DC
  Administered 2022-03-20: 20 mL
  Administered 2022-03-20 – 2022-03-21 (×2): 10 mL

## 2022-03-19 MED ORDER — POTASSIUM CHLORIDE CRYS ER 20 MEQ PO TBCR
40.0000 meq | EXTENDED_RELEASE_TABLET | Freq: Once | ORAL | Status: AC
  Administered 2022-03-19: 40 meq via ORAL
  Filled 2022-03-19: qty 2

## 2022-03-19 MED ORDER — SPIRONOLACTONE 25 MG PO TABS
25.0000 mg | ORAL_TABLET | Freq: Every day | ORAL | Status: DC
  Administered 2022-03-19 – 2022-03-20 (×2): 25 mg via ORAL
  Filled 2022-03-19 (×2): qty 1

## 2022-03-19 MED ORDER — BACLOFEN 10 MG PO TABS
10.0000 mg | ORAL_TABLET | Freq: Three times a day (TID) | ORAL | Status: DC | PRN
  Administered 2022-03-19 – 2022-03-21 (×4): 10 mg via ORAL
  Filled 2022-03-19 (×4): qty 1

## 2022-03-19 MED ORDER — CHLORHEXIDINE GLUCONATE CLOTH 2 % EX PADS
6.0000 | MEDICATED_PAD | Freq: Every day | CUTANEOUS | Status: DC
  Administered 2022-03-19 – 2022-03-22 (×4): 6 via TOPICAL

## 2022-03-19 NOTE — Progress Notes (Signed)
Patient noted to have a 8 beat run of Ventricular tachycardia. Patient denies any chest  discomfort. Dr. [name] notified no new orders received  at this time . Will continue to monitor and update MD as needed.  ?

## 2022-03-19 NOTE — Progress Notes (Signed)
Secure chat sent to Dr. Aundra Dubin regarding PICC placement. Pt refused. Attempted to educate the patient on risks vs benefits of the PICC, and the importance of CVP monitoring without success. RN aware. ?

## 2022-03-19 NOTE — Progress Notes (Signed)
?  Transition of Care (TOC) Screening Note ? ? ?Patient Details  ?Name: Dale [name] ?Date of Birth: June 02, 1961 ? ? ?Transition of Care (TOC) CM/SW Contact:    ?Trenna Kiely, LCSW ?Phone Number: ?03/19/2022, 10:02 AM ? ? ? ?Transition of Care Department [facility]) has reviewed patient and no TOC needs have been identified at this time. We will continue to monitor patient advancement through interdisciplinary progression rounds. Patient will benefit from PT/OT consult for disposition recommendations. If new patient transition needs arise, please place a TOC consult. ?  ?

## 2022-03-19 NOTE — Progress Notes (Signed)
Peripherally Inserted Central Catheter Placement ? ?The IV Nurse has discussed with the patient and/or persons authorized to consent for the patient, the purpose of this procedure and the potential benefits and risks involved with this procedure.  The benefits include less needle sticks, lab draws from the catheter, and the patient may be discharged home with the catheter. Risks include, but not limited to, infection, bleeding, blood clot (thrombus formation), and puncture of an artery; nerve damage and irregular heartbeat and possibility to perform a PICC exchange if needed/ordered by physician.  Alternatives to this procedure were also discussed.  Bard Power PICC patient education guide, fact sheet on infection prevention and patient information card has been provided to patient /or left at bedside.  PICC inserted by Quin Hoop, RN ? ? ?PICC Placement Documentation  ?PICC Triple Lumen 03/19/22 Right Brachial 40 cm 1 cm (Active)  ?Indication for Insertion or Continuance of Line Vasoactive infusions;Chronic illness with exacerbations (CF, Sickle Cell, etc.) 03/19/22 1641  ?Exposed Catheter (cm) 1 cm 03/19/22 1641  ?Site Assessment Clean, Dry, Intact 03/19/22 1641  ?Lumen #1 Status Flushed;Saline locked;Blood return noted 03/19/22 1641  ?Lumen #2 Status Flushed;Saline locked;Blood return noted 03/19/22 1641  ?Lumen #3 Status Flushed;Saline locked;Blood return noted 03/19/22 1641  ?Dressing Type Transparent;Securing device 03/19/22 1641  ?Dressing Status Antimicrobial disc in place;Clean, Dry, Intact 03/19/22 1641  ?Safety Lock Not Applicable [id number]  ?Line Care Connections checked and tightened 03/19/22 1641  ?Line Adjustment (NICU/IV Team Only) No 03/19/22 1641  ?Dressing Intervention New dressing 03/19/22 1641  ?Dressing Change Due 03/26/22 03/19/22 1641  ? ? ? ? ? ?Nur Krasinski, Nicolette Bang ?03/19/2022, 4:43 PM ? ?

## 2022-03-19 NOTE — Progress Notes (Addendum)
? ? Advanced Heart Failure Rounding Note ? ?PCP-Cardiologist: Mertie Moores, MD  ?AHF: Dr. Aundra Dubin  ? ?Subjective:   ? ?On Milrinone 0.25 + lasix gtt at 12/hr, running peripherally.  ? ?No central access for co-ox and CVP monitoring. He refused PICC.  ? ?Brisk diuresis yesterday w/ 8.6L in UOP. Net negative 7.5L. Wt down 14 lb.  ? ?SCr 1.50>>1.44 ?K 3.1 ? ?Feeling better today. No current resting dyspnea. Asking when he can go home.  ? ? ?R/LHC 03/18/22 ?  Prox LAD lesion is 15% stenosed. ?  Mid LAD lesion is 40% stenosed. ?  Prox RCA lesion is 100% stenosed. ?  2nd Mrg lesion is 30% stenosed. ?  3rd Mrg lesion is 20% stenosed. ? ? ?Right Heart Pressures RHC Procedural Findings: ?Hemodynamics (mmHg) ?RA mean 23 ?RV 50/21 ?PA 53/32, mean 40 ?PCWP mean 39 ?LV 117/35 ?AO 117/82 ? ?Oxygen saturations: ?PA 47% ?AO 97% ? ?Cardiac Output (Fick) 3.5  ?Cardiac Index (Fick) 1.53 ?PVR < 1 WU  ? ? ? ?Objective:   ?Weight Range: ?108.5 kg ?Body mass index is 33.36 kg/m?.  ? ?Vital Signs:   ?Temp:  [97.8 ?F (36.6 ?C)-98.1 ?F (36.7 ?C)] 97.9 ?F (36.6 ?C) (05/16 0730) ?Pulse Rate:  [0-92] 86 (05/16 0730) ?Resp:  [11-31] 19 (05/16 0730) ?BP: (109-139)/(73-106) 119/80 (05/16 0730) ?SpO2:  [90 %-100 %] 95 % (05/16 0730) ?Weight:  [108.5 kg-115.2 kg] 108.5 kg (05/16 0432) ?Last BM Date : 03/18/22 ? ?Weight change: ?Filed Weights  ? 03/18/22 0957 03/18/22 1430 03/19/22 0432  ?Weight: 109.8 kg 115.2 kg 108.5 kg  ? ? ?Intake/Output:  ? ?Intake/Output Summary (Last 24 hours) at 03/19/2022 0824 ?Last data filed at 03/19/2022 0820 ?Gross per 24 hour  ?Intake 1259.92 ml  ?Output 10475 ml  ?Net -9215.08 ml  ?  ? ? ?Physical Exam  ?  ?General:  Well appearing. No resp difficulty ?HEENT: Normal ?Neck: Supple. JVP 10 cm . Carotids 2+ bilat; no bruits. No lymphadenopathy or thyromegaly appreciated. ?Cor: PMI nondisplaced. Regular rate & rhythm. No rubs, gallops or murmurs. ?Lungs: Clear ?Abdomen: Soft, nontender, nondistended. No hepatosplenomegaly.  No bruits or masses. Good bowel sounds. ?Extremities: No cyanosis, clubbing, rash, 1+ b/l ankle edema ?Neuro: Alert & orientedx3, cranial nerves grossly intact. moves all 4 extremities w/o difficulty. Affect pleasant ? ? ?Telemetry  ? ?NSR 80s-90s, personally reviewed  ? ?EKG  ?  ?N/A  ? ?Labs  ?  ?CBC ?Recent Labs  ?  03/18/22 ?[id number]  ?HGB 12.9*  12.9*  ?HCT 38.0*  38.0*  ? ?Basic Metabolic Panel ?Recent Labs  ?  03/18/22 ?1456 03/19/22 ?0456  ?NA 138 137  ?K 3.4* 3.1*  ?CL 105 102  ?CO2 25 25  ?GLUCOSE 141* 118*  ?BUN 28* 25*  ?CREATININE 1.50* 1.44*  ?CALCIUM 8.5* 8.6*  ? ?Liver Function Tests ?Recent Labs  ?  03/18/22 ?1456  ?AST 26  ?ALT 21  ?ALKPHOS 88  ?BILITOT 0.7  ?PROT 6.8  ?ALBUMIN 3.4*  ? ?No results for input(s): LIPASE, AMYLASE in the last 72 hours. ?Cardiac Enzymes ?No results for input(s): CKTOTAL, CKMB, CKMBINDEX, TROPONINI in the last 72 hours. ? ?BNP: ?BNP (last 3 results) ?Recent Labs  ?  02/19/22 ?[id number] 03/13/22 ?1415 03/18/22 ?1456  ?BNP >4,500.0* >4,500.0* 2,105.2*  ? ? ?ProBNP (last 3 results) ?No results for input(s): PROBNP in the last 8760 hours. ? ? ?D-Dimer ?No results for input(s): DDIMER in the last 72 hours. ?Hemoglobin A1C ?No results for input(s): HGBA1C  in the last 72 hours. ?Fasting Lipid Panel ?No results for input(s): CHOL, HDL, LDLCALC, TRIG, CHOLHDL, LDLDIRECT in the last 72 hours. ?Thyroid Function Tests ?No results for input(s): TSH, T4TOTAL, T3FREE, THYROIDAB in the last 72 hours. ? ?Invalid input(s): FREET3 ? ?Other results: ? ? ?Imaging  ? ? ?DG Chest 2 View ? ?Result Date: 03/18/2022 ?CLINICAL DATA:  Short of breath, bilateral lower extremity swelling for 3 days EXAM: CHEST - 2 VIEW COMPARISON:  02/19/2022 FINDINGS: Frontal and lateral views of the chest demonstrates stable enlargement of the cardiac silhouette. There is chronic central vascular congestion without airspace disease, effusion, or pneumothorax. No acute bony abnormalities. IMPRESSION: 1. Chronic vascular  congestion without overt edema. Electronically Signed   By: Randa Ngo M.D.   On: 03/18/2022 19:07  ? ?CARDIAC CATHETERIZATION ? ?Result Date: 03/19/2022 ?  Prox LAD lesion is 15% stenosed.   Mid LAD lesion is 40% stenosed.   Prox RCA lesion is 100% stenosed.   2nd Mrg lesion is 30% stenosed.   3rd Mrg lesion is 20% stenosed. 1. No change in coronaries compared to prior cath, occluded proximal RCA with collaterals, nonobstructive disease in the left system. 2. Markedly elevated filling pressures. 3. Pulmonary venous hypertension. 4. Low cardiac output. Will admit, start milrinone and diuretics.  ? ?[place] EKG SITE RITE ? ?Result Date: 03/18/2022 ?If [employer] not attached, placement could not be confirmed due to current cardiac rhythm.  ? ? ?Medications:   ? ? ?Scheduled Medications: ? aspirin EC  81 mg Oral Daily  ? atorvastatin  40 mg Oral Daily  ? dapagliflozin propanediol  10 mg Oral Daily  ? digoxin  0.125 mg Oral Daily  ? enoxaparin (LOVENOX) injection  40 mg Subcutaneous Q24H  ? isosorbide-hydrALAZINE  0.5 tablet Oral TID  ? sodium chloride flush  3 mL Intravenous Q12H  ? sodium chloride flush  3 mL Intravenous Q12H  ? sodium chloride flush  3 mL Intravenous Q12H  ? spironolactone  12.5 mg Oral Daily  ? ? ?Infusions: ? sodium chloride    ? sodium chloride 10 mL/hr at 03/18/22 1016  ? sodium chloride    ? sodium chloride    ? furosemide (LASIX) 200 mg in dextrose 5% 100 mL (2mg /mL) infusion 12 mg/hr (03/19/22 0817)  ? milrinone 0.25 mcg/kg/min (03/19/22 0009)  ? ? ?PRN Medications: ?sodium chloride, sodium chloride, sodium chloride, acetaminophen, ondansetron (ZOFRAN) IV, sodium chloride flush, sodium chloride flush, sodium chloride flush ? ? ? ?Patient Profile  ? ?61 y/o male w/ chronic systolic heart failure, CAD, HTN and polysubstance use (including cocaine & alcohol). Came in for outpatient [facility] which showed elevated filling pressures and low output. Admitted for IV diuresis and inotropic support w/  milrinone.  ? ?Assessment/Plan  ? ?1. 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 He has a history of alcohol and cocaine abuse, which likely contributes to cardiomyopathy.  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 Most recent echo in 4/23 showed EF 10-15%, global HK, severe LV dilation, severely enlarged RV with severely decreased systolic function, severe biatrial enlargement, moderate MR, dilated IVC. LHC/RHC this admit showed low CI 1.53 and markedly high filling pressures, RCA was chronically occluded but he did not have obstructive disease on left.  He says that he has been taking all his meds at home now but has NYHA class IIIb symptoms. On Milrinone 0.25 + lasix gtt at 12/hr, running peripherally. No central access for co-ox and CVP monitoring.  He refused PICC. Brisk diuresis yesterday w/ 8.6L in UOP. Net negative 7.5L. Wt down 14 lb. SCr stable. K 3.1. C/w fluid overload ?- Continue Milrinone 0.25 + Lasix gtt at 12/hr. Supp K  ?- Continue Bidil 0.5 tabs tid. ?- Continue digoxin 0.125 daily  ?- Increase spironolactone to 25 mg daily  ?- Continue Farxiga 10 mg daily as long as creatinine stable.  ?- Not candidate for ICD unless he can cut back on ETOH abuse.  He has stopped cocaine. Narrow QRS so no CRT.  ?- Low output HF but with substance abuse, poor compliance, and living situation (basically homeless, lives with friends or in hotels), he is not a candidate for advanced therapies.  Will try to fully diurese him on milrinone then wean off.  ?2. CAD: LHC in 7/20 and again today showed CTO of RCA treated medically, left system ok. Suspect cardiomyopathy is primarily nonischemic.  ?- Continue ASA 81 daily + atorvastatin 40 mg daily.  ?3. Cocaine Use Disorder: Has been off cocaine for several years.  ?4. Alcohol Use Disorder: Urged him to cut back.  ?5. CKD stage 3: SCr 1.5>>1.44 ?- follow w/ diuresis  ?6. ?PAD: Difficult to palpate pedal pulses, crampy pain in feet.  ?-  Need to arrange for peripheral arterial dopplers. Orders placed  ?7. He is homeless, staying with a friend.   ?- Continue paramedicine.  ?8. Hypokalemia: 3.1 w/ diuresis  ?- increase Spiro to 25 mg daily  ?-

## 2022-03-20 ENCOUNTER — Other Ambulatory Visit: Payer: Self-pay

## 2022-03-20 ENCOUNTER — Other Ambulatory Visit (HOSPITAL_COMMUNITY): Payer: Medicaid Other

## 2022-03-20 ENCOUNTER — Encounter (HOSPITAL_COMMUNITY): Payer: Medicaid Other

## 2022-03-20 DIAGNOSIS — I5023 Acute on chronic systolic (congestive) heart failure: Secondary | ICD-10-CM | POA: Diagnosis not present

## 2022-03-20 LAB — BASIC METABOLIC PANEL WITH GFR
Anion gap: 10 (ref 5–15)
BUN: 28 mg/dL — ABNORMAL HIGH (ref 6–20)
CO2: 28 mmol/L (ref 22–32)
Calcium: 9.3 mg/dL (ref 8.9–10.3)
Chloride: 99 mmol/L (ref 98–111)
Creatinine, Ser: 1.53 mg/dL — ABNORMAL HIGH (ref 0.61–1.24)
GFR, Estimated: 52 mL/min — ABNORMAL LOW
Glucose, Bld: 124 mg/dL — ABNORMAL HIGH (ref 70–99)
Potassium: 3.4 mmol/L — ABNORMAL LOW (ref 3.5–5.1)
Sodium: 137 mmol/L (ref 135–145)

## 2022-03-20 LAB — MAGNESIUM: Magnesium: 2 mg/dL (ref 1.7–2.4)

## 2022-03-20 LAB — COOXEMETRY PANEL
Carboxyhemoglobin: 1.7 % — ABNORMAL HIGH (ref 0.5–1.5)
Methemoglobin: <0.7 % (ref 0.0–1.5)
O2 Saturation: 66.7 %
Total hemoglobin: 14.8 g/dL (ref 12.0–16.0)

## 2022-03-20 LAB — LIPOPROTEIN A (LPA): Lipoprotein (a): 58.6 nmol/L — ABNORMAL HIGH (ref <75.0)

## 2022-03-20 MED ORDER — RIVAROXABAN 10 MG PO TABS
10.0000 mg | ORAL_TABLET | Freq: Every day | ORAL | Status: DC
  Administered 2022-03-20 – 2022-03-22 (×3): 10 mg via ORAL
  Filled 2022-03-20 (×3): qty 1

## 2022-03-20 MED ORDER — SACUBITRIL-VALSARTAN 24-26 MG PO TABS
1.0000 | ORAL_TABLET | Freq: Two times a day (BID) | ORAL | Status: DC
  Administered 2022-03-20 (×2): 1 via ORAL
  Filled 2022-03-20 (×2): qty 1

## 2022-03-20 MED ORDER — POTASSIUM CHLORIDE CRYS ER 20 MEQ PO TBCR
40.0000 meq | EXTENDED_RELEASE_TABLET | Freq: Once | ORAL | Status: AC
  Administered 2022-03-20: 40 meq via ORAL
  Filled 2022-03-20: qty 2

## 2022-03-20 NOTE — Progress Notes (Signed)
Patient ID: Dale [name], male   DOB: 11-15-1960, 61 y.o.   MRN: [id number] ?  ? ? Advanced Heart Failure Rounding Note ? ?PCP-Cardiologist: Mertie Moores, MD  ?AHF: Dr. Aundra Dubin  ? ?Subjective:   ? ?On Milrinone 0.25 + lasix gtt at 12/hr.  CVP 5 today with co-ox 67%.  He has diuresed well again yesterday, weight down 6 lbs.  ? ?Creatinine 1.44 => 1.53.  ? ?Breathing improved.  ? ?R/LHC 03/18/22 ?  Prox LAD lesion is 15% stenosed. ?  Mid LAD lesion is 40% stenosed. ?  Prox RCA lesion is 100% stenosed. ?  2nd Mrg lesion is 30% stenosed. ?  3rd Mrg lesion is 20% stenosed. ? ? ?Right Heart Pressures RHC Procedural Findings: ?Hemodynamics (mmHg) ?RA mean 23 ?RV 50/21 ?PA 53/32, mean 40 ?PCWP mean 39 ?LV 117/35 ?AO 117/82 ? ?Oxygen saturations: ?PA 47% ?AO 97% ? ?Cardiac Output (Fick) 3.5  ?Cardiac Index (Fick) 1.53 ?PVR < 1 WU  ? ? ? ?Objective:   ?Weight Range: ?105.7 kg ?Body mass index is 32.5 kg/m?.  ? ?Vital Signs:   ?Temp:  [97.4 ?F (36.3 ?C)-98.2 ?F (36.8 ?C)] 97.9 ?F (36.6 ?C) (05/17 0454) ?Pulse Rate:  [54-177] 95 (05/17 0454) ?Resp:  [12-34] 20 (05/17 0454) ?BP: (100-123)/(47-80) 123/70 (05/17 0454) ?SpO2:  [88 %-100 %] 95 % (05/17 0454) ?Weight:  [105.7 kg] 105.7 kg (05/17 0013) ?Last BM Date : 03/18/22 ? ?Weight change: ?Filed Weights  ? 03/18/22 1430 03/19/22 0432 03/20/22 0013  ?Weight: 115.2 kg 108.5 kg 105.7 kg  ? ? ?Intake/Output:  ? ?Intake/Output Summary (Last 24 hours) at 03/20/2022 0702 ?Last data filed at 03/20/2022 0532 ?Gross per 24 hour  ?Intake 1871.19 ml  ?Output 7750 ml  ?Net -5878.81 ml  ?  ? ? ?Physical Exam  ?  ?General: NAD ?Neck: No JVD, no thyromegaly or thyroid nodule.  ?Lungs: Clear to auscultation bilaterally with normal respiratory effort. ?CV: Lateral PMI.  Heart regular S1/S2, no S3/S4, no murmur.  No peripheral edema.   ?Abdomen: Soft, nontender, no hepatosplenomegaly, no distention.  ?Skin: Intact without lesions or rashes.  ?Neurologic: Alert and oriented x 3.  ?Psych: Normal  affect. ?Extremities: No clubbing or cyanosis.  ?HEENT: Normal.  ? ? ?Telemetry  ? ?NSR 80s-90s, personally reviewed  ? ?EKG  ?  ?N/A  ? ?Labs  ?  ?CBC ?Recent Labs  ?  03/18/22 ?[id number]  ?HGB 12.9*  12.9*  ?HCT 38.0*  38.0*  ? ?Basic Metabolic Panel ?Recent Labs  ?  03/19/22 ?0456 03/20/22 ?[id number]  ?NA 137 137  ?K 3.1* 3.4*  ?CL 102 99  ?CO2 25 28  ?GLUCOSE 118* 124*  ?BUN 25* 28*  ?CREATININE 1.44* 1.53*  ?CALCIUM 8.6* 9.3  ?MG  --  2.0  ? ?Liver Function Tests ?Recent Labs  ?  03/18/22 ?1456  ?AST 26  ?ALT 21  ?ALKPHOS 88  ?BILITOT 0.7  ?PROT 6.8  ?ALBUMIN 3.4*  ? ?No results for input(s): LIPASE, AMYLASE in the last 72 hours. ?Cardiac Enzymes ?No results for input(s): CKTOTAL, CKMB, CKMBINDEX, TROPONINI in the last 72 hours. ? ?BNP: ?BNP (last 3 results) ?Recent Labs  ?  02/19/22 ?[id number] 03/13/22 ?1415 03/18/22 ?1456  ?BNP >4,500.0* >4,500.0* 2,105.2*  ? ? ?ProBNP (last 3 results) ?No results for input(s): PROBNP in the last 8760 hours. ? ? ?D-Dimer ?No results for input(s): DDIMER in the last 72 hours. ?Hemoglobin A1C ?No results for input(s): HGBA1C in the last 72 hours. ?Fasting Lipid  Panel ?No results for input(s): CHOL, HDL, LDLCALC, TRIG, CHOLHDL, LDLDIRECT in the last 72 hours. ?Thyroid Function Tests ?No results for input(s): TSH, T4TOTAL, T3FREE, THYROIDAB in the last 72 hours. ? ?Invalid input(s): FREET3 ? ?Other results: ? ? ?Imaging  ? ? ?[place] EKG SITE RITE ? ?Result Date: 03/19/2022 ?If [employer] not attached, placement could not be confirmed due to current cardiac rhythm.  ? ? ?Medications:   ? ? ?Scheduled Medications: ? aspirin EC  81 mg Oral Daily  ? atorvastatin  40 mg Oral Daily  ? Chlorhexidine Gluconate Cloth  6 each Topical Daily  ? dapagliflozin propanediol  10 mg Oral Daily  ? digoxin  0.125 mg Oral Daily  ? enoxaparin (LOVENOX) injection  40 mg Subcutaneous Q24H  ? isosorbide-hydrALAZINE  0.5 tablet Oral TID  ? sodium chloride flush  10-40 mL Intracatheter Q12H  ? sodium chloride flush  3  mL Intravenous Q12H  ? sodium chloride flush  3 mL Intravenous Q12H  ? sodium chloride flush  3 mL Intravenous Q12H  ? spironolactone  25 mg Oral Daily  ? ? ?Infusions: ? sodium chloride    ? sodium chloride 10 mL/hr at 03/18/22 1016  ? sodium chloride    ? sodium chloride    ? furosemide (LASIX) 200 mg in dextrose 5% 100 mL (2mg /mL) infusion 12 mg/hr (03/20/22 0000)  ? milrinone 0.25 mcg/kg/min (03/20/22 0532)  ? ? ?PRN Medications: ?sodium chloride, sodium chloride, sodium chloride, acetaminophen, baclofen, ondansetron (ZOFRAN) IV, sodium chloride flush, sodium chloride flush, sodium chloride flush, sodium chloride flush ? ? ? ?Patient Profile  ? ?61 y/o male w/ chronic systolic heart failure, CAD, HTN and polysubstance use (including cocaine & alcohol). Came in for outpatient [facility] which showed elevated filling pressures and low output. Admitted for IV diuresis and inotropic support w/ milrinone.  ? ?Assessment/Plan  ? ?1. 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 He has a history of alcohol and cocaine abuse, which likely contributes to cardiomyopathy.  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 Most recent echo in 4/23 showed EF 10-15%, global HK, severe LV dilation, severely enlarged RV with severely decreased systolic function, severe biatrial enlargement, moderate MR, dilated IVC. LHC/RHC this admit showed low CI 1.53 and markedly high filling pressures, RCA was chronically occluded but he did not have obstructive disease on left.  He says that he has been taking all his meds at home now but had NYHA class IIIb symptoms. On Milrinone 0.25 + lasix gtt at 12/hr, CVP 5 now with co-ox 67%.  Excellent diuresis again yesterday.  ?- Stop Lasix gtt.  ?- Decrease milrinone to 0.125 ?- Likely start torsemide tomorrow.  ?- Add Entresto 24/26 bid.  ?- Continue Bidil 0.5 tabs tid. ?- Continue digoxin 0.125 daily  ?- Continue spironolactone 25 mg daily  ?- Continue Farxiga 10 mg  daily.  ?- Not candidate for ICD unless he can cut back on ETOH abuse.  He has stopped cocaine. Narrow QRS so no CRT.  ?- Low output HF but with substance abuse, poor compliance, and living situation (basically homeless, lives with friends or in hotels), he is not a candidate for advanced therapies.  Would also be poor home milrinone candidate.   ?2. CAD: LHC in 7/20 and again today showed CTO of RCA treated medically, left system ok. Suspect cardiomyopathy is primarily nonischemic.  ?- Continue ASA 81 daily + atorvastatin 40 mg daily.  ?3. Cocaine Use Disorder: Has been off  cocaine for several years.  ?4. Alcohol Use Disorder: Urged him to cut back.  ?5. CKD stage 3: SCr 1.5>>1.44>>1.53 ?- follow w/ diuresis  ?6. ?PAD: Difficult to palpate pedal pulses, crampy pain in feet.  ?- Need to arrange for peripheral arterial dopplers. Orders placed  ?7. He is homeless, staying with a friend.   ?- Continue paramedicine.  ?8. Hypokalemia: 3.4, supplement.  ? ?Length of Stay: 2 ? ?Loralie Champagne, MD  ?03/20/2022, 7:02 AM ? ?Advanced Heart Failure Team ?Pager [phone] (M-F; 7a - 5p)  ?Please contact [facility] for night-coverage after hours (5p -7a ) and weekends on [email or web address] ?

## 2022-03-21 ENCOUNTER — Inpatient Hospital Stay (HOSPITAL_COMMUNITY): Payer: Medicaid Other

## 2022-03-21 DIAGNOSIS — R252 Cramp and spasm: Secondary | ICD-10-CM

## 2022-03-21 DIAGNOSIS — I5023 Acute on chronic systolic (congestive) heart failure: Secondary | ICD-10-CM | POA: Diagnosis not present

## 2022-03-21 LAB — MAGNESIUM: Magnesium: 2.2 mg/dL (ref 1.7–2.4)

## 2022-03-21 LAB — COOXEMETRY PANEL
Carboxyhemoglobin: 1.5 % (ref 0.5–1.5)
Methemoglobin: 0.7 % (ref 0.0–1.5)
O2 Saturation: 65.4 %
Total hemoglobin: 16.6 g/dL — ABNORMAL HIGH (ref 12.0–16.0)

## 2022-03-21 LAB — BASIC METABOLIC PANEL
Anion gap: 10 (ref 5–15)
BUN: 36 mg/dL — ABNORMAL HIGH (ref 6–20)
CO2: 25 mmol/L (ref 22–32)
Calcium: 8.8 mg/dL — ABNORMAL LOW (ref 8.9–10.3)
Chloride: 99 mmol/L (ref 98–111)
Creatinine, Ser: 1.97 mg/dL — ABNORMAL HIGH (ref 0.61–1.24)
GFR, Estimated: 39 mL/min — ABNORMAL LOW (ref >60)
Glucose, Bld: 173 mg/dL — ABNORMAL HIGH (ref 70–99)
Potassium: 3.6 mmol/L (ref 3.5–5.1)
Sodium: 134 mmol/L — ABNORMAL LOW (ref 135–145)

## 2022-03-21 LAB — DIGOXIN LEVEL: Digoxin Level: 0.6 ng/mL — ABNORMAL LOW (ref 0.8–2.0)

## 2022-03-21 MED ORDER — PANTOPRAZOLE SODIUM 40 MG IV SOLR
40.0000 mg | Freq: Every day | INTRAVENOUS | Status: DC
  Administered 2022-03-21: 40 mg via INTRAVENOUS
  Filled 2022-03-21: qty 10

## 2022-03-21 NOTE — Progress Notes (Signed)
Pt c/o n&V, zofran given x 1 with relief. Patinet stated "I noticed, after I ate that pie, my stomach started getting upset".   Patient stated "I feel so much better now".

## 2022-03-21 NOTE — Progress Notes (Signed)
Patient BP 78/50 (59) Dr. [name] notified new orders received.

## 2022-03-21 NOTE — Progress Notes (Signed)
VASCULAR LAB    ABIs have been performed.  See CV proc for preliminary results.   Matty Deamer, RVT 03/21/2022, 11:27 AM

## 2022-03-21 NOTE — Progress Notes (Signed)
Patient's BP 86/54, HR 94. Alert and responsive spontaneously. CVP ranges from 5-7. Dr. Brayton Layman of [facility] notified via page.

## 2022-03-21 NOTE — Progress Notes (Addendum)
Patient c/o nausea, zofran given IV>  Denies any pain.

## 2022-03-21 NOTE — Progress Notes (Addendum)
Attachment edited.

## 2022-03-21 NOTE — Progress Notes (Signed)
   03/21/22 0424  Assess: MEWS Score  Temp (!) 97.4 F (36.3 C)  BP (!) 86/54 (rechecked manually)  Pulse Rate (!) 102  ECG Heart Rate 98  Resp 18  Level of Consciousness Alert  SpO2 95 %  Assess: if the MEWS score is Yellow or Red  Were vital signs taken at a resting state? Yes  Focused Assessment No change from prior assessment  Early Detection of Sepsis Score *See Row Information* Low  MEWS guidelines implemented *See Row Information* Yes  Treat  Pain Scale 0-10  Pain Score 0  Take Vital Signs  Increase Vital Sign Frequency  Yellow: Q 2hr X 2 then Q 4hr X 2, if remains yellow, continue Q 4hrs  Escalate  MEWS: Escalate Yellow: discuss with charge nurse/RN and consider discussing with provider and RRT  Notify: Charge Nurse/RN  Name of Charge Nurse/RN Notified Dalls RN  Date Charge Nurse/RN Notified 03/21/22  Time Charge Nurse/RN Notified 0932  Notify: Provider  Provider Name/Title Dr. Brayton Layman  Date Provider Notified 03/21/22  Time Provider Notified [phone]  Method of Notification Page  Notification Reason Other (Comment) (Yellow mews. low BP)  Provider response No new orders;Evaluate remotely  Date of Provider Response 03/21/22  Time of Provider Response [phone]

## 2022-03-21 NOTE — Plan of Care (Signed)

## 2022-03-21 NOTE — Progress Notes (Signed)
Patient ID: Dale [name], male   DOB: [birth year], 61 y.o.   MRN: [id number]     Advanced Heart Failure Rounding Note  PCP-Cardiologist: Mertie Moores, MD  AHF: Dr. Aundra Dubin   Subjective:    On Milrinone 0.125, Lasix stopped yesterday.  CVP 6-7 today with co-ox 65%.  Weight down 4 more lbs.   Creatinine 1.44 => 1.53 => 1.97   Breathing improved.   [facility] 03/18/22   Prox LAD lesion is 15% stenosed.   Mid LAD lesion is 40% stenosed.   Prox RCA lesion is 100% stenosed.   2nd Mrg lesion is 30% stenosed.   3rd Mrg lesion is 20% stenosed.   Right Heart Pressures RHC Procedural Findings: Hemodynamics (mmHg) RA mean 23 RV 50/21 PA 53/32, mean 40 PCWP mean 39 LV 117/35 AO 117/82  Oxygen saturations: PA 47% AO 97%  Cardiac Output (Fick) 3.5  Cardiac Index (Fick) 1.53 PVR < 1 WU     Objective:   Weight Range: 104.2 kg Body mass index is 32.04 kg/m.   Vital Signs:   Temp:  [97.4 F (36.3 C)-98.5 F (36.9 C)] 97.9 F (36.6 C) (05/18 0715) Pulse Rate:  [41-108] 63 (05/18 0725) Resp:  [11-32] 19 (05/18 0717) BP: (78-124)/(50-92) 78/50 (05/18 0715) SpO2:  [90 %-100 %] 90 % (05/18 0725) Weight:  [104.2 kg] 104.2 kg (05/18 0424) Last BM Date : 03/18/22  Weight change: Filed Weights   03/19/22 0432 03/20/22 0013 03/21/22 0424  Weight: 108.5 kg 105.7 kg 104.2 kg    Intake/Output:   Intake/Output Summary (Last 24 hours) at 03/21/2022 0753 Last data filed at 03/20/2022 2100 Gross per 24 hour  Intake 916.39 ml  Output 300 ml  Net 616.39 ml      Physical Exam    General: NAD Neck: No JVD, no thyromegaly or thyroid nodule.  Lungs: Clear to auscultation bilaterally with normal respiratory effort. CV: Lateral PMI.  Heart regular S1/S2, no S3/S4, no murmur.  No peripheral edema.   Abdomen: Soft, nontender, no hepatosplenomegaly, no distention.  Skin: Intact without lesions or rashes.  Neurologic: Alert and oriented x 3.  Psych: Normal affect. Extremities: No  clubbing or cyanosis.  HEENT: Normal.   Telemetry   NSR 80s-90s, personally reviewed   EKG    N/A   Labs    CBC Recent Labs    03/18/22 1214  HGB 12.9*  12.9*  HCT 38.0*  99991111*   Basic Metabolic Panel Recent Labs    03/20/22 0523 03/21/22 0345  NA 137 134*  K 3.4* 3.6  CL 99 99  CO2 28 25  GLUCOSE 124* 173*  BUN 28* 36*  CREATININE 1.53* 1.97*  CALCIUM 9.3 8.8*  MG 2.0 2.2   Liver Function Tests Recent Labs    03/18/22 1456  AST 26  ALT 21  ALKPHOS 88  BILITOT 0.7  PROT 6.8  ALBUMIN 3.4*   No results for input(s): LIPASE, AMYLASE in the last 72 hours. Cardiac Enzymes No results for input(s): CKTOTAL, CKMB, CKMBINDEX, TROPONINI in the last 72 hours.  BNP: BNP (last 3 results) Recent Labs    02/19/22 0319 03/13/22 1415 03/18/22 1456  BNP >4,500.0* >4,500.0* 2,105.2*    ProBNP (last 3 results) No results for input(s): PROBNP in the last 8760 hours.   D-Dimer No results for input(s): DDIMER in the last 72 hours. Hemoglobin A1C No results for input(s): HGBA1C in the last 72 hours. Fasting Lipid Panel No results for input(s): CHOL, HDL, LDLCALC,  TRIG, CHOLHDL, LDLDIRECT in the last 72 hours. Thyroid Function Tests No results for input(s): TSH, T4TOTAL, T3FREE, THYROIDAB in the last 72 hours.  Invalid input(s): FREET3  Other results:   Imaging    No results found.   Medications:     Scheduled Medications:  aspirin EC  81 mg Oral Daily   atorvastatin  40 mg Oral Daily   Chlorhexidine Gluconate Cloth  6 each Topical Daily   digoxin  0.125 mg Oral Daily   rivaroxaban  10 mg Oral Daily   sodium chloride flush  10-40 mL Intracatheter Q12H   sodium chloride flush  3 mL Intravenous Q12H   sodium chloride flush  3 mL Intravenous Q12H   sodium chloride flush  3 mL Intravenous Q12H    Infusions:  sodium chloride     sodium chloride 10 mL/hr at 03/18/22 1016   sodium chloride     sodium chloride      PRN Medications: sodium  chloride, sodium chloride, sodium chloride, acetaminophen, baclofen, ondansetron (ZOFRAN) IV, sodium chloride flush, sodium chloride flush, sodium chloride flush, sodium chloride flush    Patient Profile   61 y/o male w/ chronic systolic heart failure, CAD, HTN and polysubstance use (including cocaine & alcohol). Came in for outpatient [facility] which showed elevated filling pressures and low output. Admitted for IV diuresis and inotropic support w/ milrinone.   Assessment/Plan   1. 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 He has a history of alcohol and cocaine abuse, which likely contributes to cardiomyopathy.  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 Most recent echo in 4/23 showed EF 10-15%, global HK, severe LV dilation, severely enlarged RV with severely decreased systolic function, severe biatrial enlargement, moderate MR, dilated IVC. LHC/RHC this admit showed low CI 1.53 and markedly high filling pressures, RCA was chronically occluded but he did not have obstructive disease on left.  He says that he has been taking all his meds at home now but had NYHA class IIIb symptoms. On Milrinone 0.125, CVP 6-7 now with co-ox 65%.  Weight down again.  BP low overnight with creatinine up.  - Stop milrinone.   - No diuretic today.  - Stop Entresto with hypotension and rise in creatinine.  - Hold Bidil, spironolactone, and Farxiga today.  - Continue digoxin 0.125 daily  - Not candidate for ICD unless he can cut back on ETOH abuse.  He has stopped cocaine. Narrow QRS so no CRT.  - Low output HF but with substance abuse, poor compliance, and living situation (basically homeless, lives with friends or in hotels), he is not a candidate for advanced therapies.  Would also be poor home milrinone candidate.   2. CAD: LHC in 7/20 and again today showed CTO of RCA treated medically, left system ok. Suspect cardiomyopathy is primarily nonischemic.  - Continue ASA 81 daily +  atorvastatin 40 mg daily.  3. Cocaine Use Disorder: Has been off cocaine for several years.  4. Alcohol Use Disorder: Urged him to cut back.  5. AKI on CKD stage 3: SCr 1.5>>1.44>>1.53>>1.97.  Suspect overdiuresis and hypotension with rise in creatinine.   6. ?PAD: Difficult to palpate pedal pulses, crampy pain in feet.  - Need to arrange for peripheral arterial dopplers. Orders placed  7. He is homeless, staying with a friend.   - Continue paramedicine.   Length of Stay: 3  Loralie Champagne, MD  03/21/2022, 7:53 AM  Advanced Heart Failure Team Pager [phone] (M-F; 7a -  5p)  Please contact [facility] for night-coverage after hours (5p -7a ) and weekends on [email or web address]

## 2022-03-22 ENCOUNTER — Other Ambulatory Visit (HOSPITAL_COMMUNITY): Payer: Self-pay

## 2022-03-22 DIAGNOSIS — I5023 Acute on chronic systolic (congestive) heart failure: Secondary | ICD-10-CM | POA: Diagnosis not present

## 2022-03-22 LAB — COOXEMETRY PANEL
Carboxyhemoglobin: 1.5 % (ref 0.5–1.5)
Methemoglobin: <0.7 % (ref 0.0–1.5)
O2 Saturation: 57.8 %
Total hemoglobin: 15.6 g/dL (ref 12.0–16.0)

## 2022-03-22 LAB — BASIC METABOLIC PANEL
Anion gap: 8 (ref 5–15)
BUN: 41 mg/dL — ABNORMAL HIGH (ref 6–20)
CO2: 28 mmol/L (ref 22–32)
Calcium: 8.9 mg/dL (ref 8.9–10.3)
Chloride: 100 mmol/L (ref 98–111)
Creatinine, Ser: 1.85 mg/dL — ABNORMAL HIGH (ref 0.61–1.24)
GFR, Estimated: 42 mL/min — ABNORMAL LOW (ref >60)
Glucose, Bld: 105 mg/dL — ABNORMAL HIGH (ref 70–99)
Potassium: 3.7 mmol/L (ref 3.5–5.1)
Sodium: 136 mmol/L (ref 135–145)

## 2022-03-22 LAB — MAGNESIUM: Magnesium: 2.4 mg/dL (ref 1.7–2.4)

## 2022-03-22 MED ORDER — POTASSIUM CHLORIDE CRYS ER 20 MEQ PO TBCR
40.0000 meq | EXTENDED_RELEASE_TABLET | Freq: Every day | ORAL | 5 refills | Status: DC
  Filled 2022-03-22: qty 60, 30d supply, fill #0

## 2022-03-22 MED ORDER — DIGOXIN 125 MCG PO TABS
0.1250 mg | ORAL_TABLET | Freq: Every day | ORAL | Status: DC
  Administered 2022-03-22: 0.125 mg via ORAL
  Filled 2022-03-22: qty 1

## 2022-03-22 MED ORDER — ISOSORB DINITRATE-HYDRALAZINE 20-37.5 MG PO TABS
0.5000 | ORAL_TABLET | Freq: Three times a day (TID) | ORAL | Status: DC
  Administered 2022-03-22: 0.5 via ORAL
  Filled 2022-03-22: qty 1

## 2022-03-22 MED ORDER — TORSEMIDE 20 MG PO TABS
40.0000 mg | ORAL_TABLET | Freq: Every day | ORAL | 5 refills | Status: DC
Start: 2022-03-24 — End: 2022-04-19
  Filled 2022-03-22: qty 60, 30d supply, fill #0

## 2022-03-22 MED ORDER — DAPAGLIFLOZIN PROPANEDIOL 10 MG PO TABS
10.0000 mg | ORAL_TABLET | Freq: Every day | ORAL | Status: DC
  Administered 2022-03-22: 10 mg via ORAL
  Filled 2022-03-22: qty 1

## 2022-03-22 NOTE — TOC Initial Note (Addendum)
Transition of Care [facility]) - Initial/Assessment Note    Patient Details  Name: Dale [name] MRN: [id number] Date of Birth: October 21, 1961  Transition of Care [facility]) CM/SW Contact:    Erenest Rasher, RN Phone Number: [phone] 03/22/2022, 9:44 AM  Clinical Narrative:                  HF TOC CM spoke to pt and states he lives with a friend. He is planning to get his own apartment soon. Has scale at home to weigh. Provided pt with bus tickets for transportation home. [employer], and has monthly income. Spoke to [place] and pt not due for meds. He can pick up at [facility] when he has begins to run out. Rx will be on file. Updated Unit RN.    Expected Discharge Plan: Home/Self Care Barriers to Discharge: No Barriers Identified   Patient Goals and CMS Choice Patient states their goals for this hospitalization and ongoing recovery are:: wants to remain independent and able to care for himself      Expected Discharge Plan and Services Expected Discharge Plan: Home/Self Care   Discharge Planning Services: CM Consult     Expected Discharge Date: 03/22/22                                    Prior Living Arrangements/Services   Lives with:: Friends   Do you feel safe going back to the place where you live?: Yes      Need for Family Participation in Patient Care: No (Comment) Care giver support system in place?: No (comment) Current home services:  (rollator, scale) Criminal Activity/Legal Involvement Pertinent to Current Situation/Hospitalization: No - Comment as needed  Activities of Daily Living   ADL Screening (condition at time of admission) Patient's cognitive ability adequate to safely complete daily activities?: Yes Is the patient deaf or have difficulty hearing?: No Does the patient have difficulty seeing, even when wearing glasses/contacts?: No Does the patient have difficulty concentrating, remembering, or making decisions?:  No Patient able to express need for assistance with ADLs?: Yes Does the patient have difficulty dressing or bathing?: No Independently performs ADLs?: Yes (appropriate for developmental age) Does the patient have difficulty walking or climbing stairs?: No Weakness of Legs: Both Weakness of Arms/Hands: None  Permission Sought/Granted Permission sought to share information with : Case Manager, Family Supports, PCP Permission granted to share information with : Yes, Verbal Permission Granted  Share Information with NAME: Dale [name]     Permission granted to share info w Relationship: daughter  Permission granted to share info w Contact Information: [phone]  Emotional Assessment Appearance:: Appears stated age Attitude/Demeanor/Rapport: Engaged Affect (typically observed): Accepting Orientation: : Oriented to Self, Oriented to Place, Oriented to  Time, Oriented to Situation   Psych Involvement: No (comment)  Admission diagnosis:  Acute on chronic systolic (congestive) heart failure (HCC) [I50.23] Acute on chronic systolic CHF (congestive heart failure) (HCC) [I50.23] Patient Active Problem List   Diagnosis Date Noted   Acute on chronic systolic (congestive) heart failure ([place]) 03/18/2022   Acute on chronic systolic CHF (congestive heart failure) ([place]) 03/18/2022   Alcohol use disorder, moderate, in early remission ([place]) 07/13/2021   Adjustment disorder with mixed anxiety and depressed mood    Palliative care by specialist    Prolonged QT interval 06/14/2021   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 ([place]) 06/13/2021   CHF (congestive heart failure) ([place]) 06/13/2021   Acute on chronic systolic CHF (congestive heart failure), NYHA class 4 (HCC)    CKD (chronic kidney disease), stage III ([place]) 06/01/2021   Hypokalemia    Acute on chronic HFrEF (heart failure with reduced ejection fraction) ([place]) 12/25/2020   Coronary artery disease involving  native coronary artery of native heart without angina pectoris    Acute kidney injury superimposed on chronic kidney disease ([place]) 10/16/2020   Acute on chronic combined systolic and diastolic HF (heart failure) ([place]) 09/08/2020   Acute on chronic combined systolic (congestive) and diastolic (congestive) heart failure (HCC)    Acute CHF (congestive heart failure) ([place]) 05/07/2019   Nausea & vomiting 05/07/2019   Acute exacerbation of CHF (congestive heart failure) ([place]) 03/11/2019   Troponin level elevated 03/11/2019   Hyperlipidemia 03/03/2019   NSVT (nonsustained ventricular tachycardia) ([place]) 03/03/2019   Anxiety and depression    NSTEMI (non-ST elevated myocardial infarction) ([place]) [id number]   Acute systolic heart failure ([place]) 11/08/2018   Hypertensive urgency 09/28/2018   Acute pulmonary edema ([place]) 09/28/2018   Cocaine abuse ([place]) 09/28/2018   Substance abuse ([place]) 09/28/2018   Chronic kidney disease, stage II (mild) 09/28/2018   Normochromic anemia [id number]   Acute systolic CHF (congestive heart failure) ([place]) 09/27/2018   Essential hypertension 03/11/2016   Obesity (BMI 30-39.9) 03/11/2016   Glucosuria 03/11/2016   PCP:  Thayer Headings, MD Pharmacy:   [id number], [place] [address] Phone: [phone] Fax: [place], [place] [place] [place] [address] Phone: [phone] Fax: [place] [address] [place] [address] Phone: [phone] Fax: [place] [address] [place] [address] Phone: [phone] Fax: [phone]     Social Determinants of Health (SDOH) Interventions    Readmission Risk Interventions    12/15/2020    3:39 PM 09/11/2020   10:19 AM  Readmission Risk Prevention  Plan  Transportation Screening Complete Complete  PCP or Specialist Appt within 3-5 Days  Complete  HRI or [place]  Complete  Social Work Consult for [place] Planning/Counseling  Complete  Palliative Care Screening  Complete  Medication Review Press photographer) Complete Complete  PCP or Specialist appointment within 3-5 days of discharge Complete   HRI or [place] Complete   SW Recovery Care/Counseling Consult Complete   [place] Not Applicable

## 2022-03-22 NOTE — Discharge Summary (Addendum)
Advanced Heart Failure Team  Discharge Summary   Patient ID: Dale [name] MRN: [id number], DOB/AGE: [birth year] 61 y.o. Admit date: 03/18/2022 D/C date:     03/22/2022   Primary Discharge Diagnoses:  Acute on chronic systolic CHF with low-output Mixed ischemic and nonischemic cardiomyopathy CAD Alcohol use disorder Cocaine use disorder AKI on CKD III Homelessness   Hospital Course:   Virlan Mahnken is a 61 y.o. male  with a PMH significant for hypertension, polysubstance use (including cocaine & alcohol), chronic combined CHF, and CAD.   He was admitted 11/19 with signs/symptoms of CHF, in the setting of poorly controlled hypertension alcohol and cocaine use. He was started on IV Lasix and echo showed EF 20%, G2 DD.  He left AMA.   Readmitted 12/19 with CHF exacerbation, started on IV diuretics, beta-blocker, ARB and BiDil.  Discharged on oral torsemide.  He remained positive for cocaine on UDS.   Admitted 4/20 with chest pain and CHF. Diagnosed with NSTEMI. He declined cardiac catheterization.     Admitted 5/20 with CHF and chest pain, still using cocaine, but no evidence of ischemia.  Diuresed and discharged.   Echo (5/20) EF 20 to 25%, elevated LVEDP, diffuse hypokinesis with worse hypokinesis and inferior wall moderately enlarged RV with normal function.   Admitted in 7/20 with acute CHF and underwent L/RHC showing severe single vessel CAD with CTO of pRCA, mild to moderate non-obs CAD involving LAD and LCx, moderately elevated left heart filling pressures, severely elevated right heart filling pressures, mild pulmonary hypertension and normal CO/CI by Fick. He was treated medically.   2 CHF admissions 2021. 6 admissions in 2022.    Admitted 7/22 with NSTEMI and troponin > 1000,  but treated medically due to continued cocaine use and concern for poor medication adherence if stent was needed.   Admitted 8/22 with CHF exacerbation.  Echo with EF <10%, medications  restarted. Farxiga and spiro added.      Admitted again in 4/23 with CHF, refused PICC line and refused RHC/LHC.  Concern for low output HF with AKI/creatinine up to 2.  Patient was started on milrinone empirically with good diuresis, creatinine down to about 1.7. Echo this admission showed EF 10-15%, global HK, severe LV dilation, severely enlarged RV with severely decreased systolic function, severe biatrial enlargement, moderate MR, dilated IVC.    At last visit to CHF clinic in 5/23, he had been out of all meds except Bidil x 5 days and was significantly volume overloaded. He is not using cocaine or smoking cigarettes, but he has been smoking marijuana and drinking heavily (1 quart liquor every 2 days or so).    Presented on 03/18/22 for outpatient RHC/LHC and was admitted with elevated filling pressures and low output.  Had chornically occluded RCA with collaterals and nonobstructive disease elsewhere.  PICC line was placed and he was started on milrinone for inotrope support. Diuresed with IV lasix. Developed hypotension and AKI d/t over diuresis. Kept one additional day for monitoring. GDMT was held. At discharge was restarted on SGLT2i, 1/2 tablet bidil TID, digoxin and instructed to restart po diuretic at home on 05/21.   Has f/u in HF clinic on 05/31. Will need BMET and BNP day of f/u. Continue paramedicine in the community.  He will be provided a bus pass for transportation home. Will be staying with a friend.  See below for hospital course by problem.   Hospital Course by Problem: 1. Acute on chronic systolic CHF with  low-output: Likely mixed ischemic/nonischemic cardiomyopathy. He has a history of alcohol and cocaine abuse, which likely contributes to cardiomyopathy.  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 Most recent echo in 4/23 showed EF 10-15%, global HK, severe LV dilation, severely enlarged RV with severely decreased systolic function, severe biatrial enlargement, moderate MR,  dilated IVC. LHC/RHC this admit showed low CI 1.53 and markedly high filling pressures, RCA was chronically occluded but he did not have obstructive disease on left.  He says that he has been taking all his meds at home now but had NYHA class IIIb symptoms.  - Milrinone off 05/18. CO-OX 58% this am.  - Diuretics on hold d/t AKI. Scr starting to trend back down, 1.5>1.97>1.85 - Entresto, bidil, spiro, digoxin and farxiga held d/t AKI and hypotension -Add back [place] at discharge -Add back Bidil 1/2 tablet TID -Resume digoxin 0.125 mg daily -Restart home Torsemide 40 mg daily and KCL 40 mEq daily on 05/21 -Slowly reintroduce spiro +/- ARNi at f/u if Scr improving.  - Not candidate for ICD unless he can cut back on ETOH abuse.  He has stopped cocaine. Narrow QRS so no CRT.  - Low output HF but with substance abuse, poor compliance, and living situation (basically homeless, lives with friends or in hotels), he is not a candidate for advanced therapies.  Would also be poor home milrinone candidate.   2. CAD: LHC in 7/20 and again today showed CTO of RCA treated medically, left system ok. Suspect cardiomyopathy is primarily nonischemic.  - Continue ASA 81 daily + atorvastatin 40 mg daily.  3. Cocaine Use Disorder: Has been off cocaine for several years.  4. Alcohol Use Disorder: Urged him to cut back.  5. AKI on CKD stage 3: SCr 1.5>>1.44>>1.53>>1.97>>1.85.  Suspect overdiuresis and hypotension with rise in creatinine.   6. ?PAD: Difficult to palpate pedal pulses, crampy pain in feet.  - Arterial dopplers with normal ABIs 7. SDOH: - He is homeless, staying with a friend.   - Continue paramedicine in the community.   Discharge Weight Range: 253>>229  Discharge Vitals: Blood pressure 105/78, pulse 92, temperature (!) 97.3 F (36.3 C), temperature source Oral, resp. rate 20, height 5\' 11"  (1.803 m), weight 104.2 kg, SpO2 97 %.  Labs: Lab Results  Component Value Date   WBC 6.6 02/22/2022   HGB  12.9 (L) 03/18/2022   HGB 12.9 (L) 03/18/2022   HCT 38.0 (L) 03/18/2022   HCT 38.0 (L) 03/18/2022   MCV 91.9 02/22/2022   PLT 257 02/22/2022    Recent Labs  Lab 03/18/22 1456 03/19/22 0456 03/22/22 0405  NA 138   < > 136  K 3.4*   < > 3.7  CL 105   < > 100  CO2 25   < > 28  BUN 28*   < > 41*  CREATININE 1.50*   < > 1.85*  CALCIUM 8.5*   < > 8.9  PROT 6.8  --   --   BILITOT 0.7  --   --   ALKPHOS 88  --   --   ALT 21  --   --   AST 26  --   --   GLUCOSE 141*   < > 105*   < > = values in this interval not displayed.   Lab Results  Component Value Date   CHOL 127 02/20/2022   HDL 30 (L) 02/20/2022   LDLCALC 87 02/20/2022   TRIG 49 02/20/2022   BNP (  last 3 results) Recent Labs    02/19/22 0319 03/13/22 1415 03/18/22 1456  BNP >4,500.0* >4,500.0* 2,105.2*    ProBNP (last 3 results) No results for input(s): PROBNP in the last 8760 hours.   Diagnostic Studies/Procedures  [facility] 03/18/22:   Prox LAD lesion is 15% stenosed.   Mid LAD lesion is 40% stenosed.   Prox RCA lesion is 100% stenosed.   2nd Mrg lesion is 30% stenosed.   3rd Mrg lesion is 20% stenosed.   1. No change in coronaries compared to prior cath, occluded proximal RCA with collaterals, nonobstructive disease in the left system.  2. Markedly elevated filling pressures.  3. Pulmonary venous hypertension.  4. Low cardiac output.    Will admit, start milrinone and diuretics.   B/l LE Arterial Dopplers, 03/21/22: Summary:  Right: Resting right ankle-brachial index is within normal range. No  evidence of significant right lower extremity arterial disease. The right  toe-brachial index is normal.   Left: Resting left ankle-brachial index is within normal range. No  evidence of significant left lower extremity arterial disease. The left  toe-brachial index is normal.    Discharge Medications   Allergies as of 03/22/2022       Reactions   Hydrocodone Itching        Medication List      STOP taking these medications    diclofenac sodium 1 % Gel Commonly known as: VOLTAREN   spironolactone 25 MG tablet Commonly known as: ALDACTONE       TAKE these medications    aspirin EC 81 MG tablet Take 1 tablet (81 mg total) by mouth daily. Swallow whole.   atorvastatin 40 MG tablet Commonly known as: LIPITOR Take 1 tablet (40 mg total) by mouth daily.   BiDil 20-37.5 MG tablet Generic drug: isosorbide-hydrALAZINE Take 0.5 tablets by mouth 3 (three) times daily.   dapagliflozin propanediol 10 MG Tabs tablet Commonly known as: FARXIGA Take 1 tablet (10 mg total) by mouth daily.   digoxin 0.125 MG tablet Commonly known as: LANOXIN Take 1 tablet (0.125 mg total) by mouth daily.   potassium chloride SA 20 MEQ tablet Commonly known as: KLOR-CON M Take 2 tablets (40 mEq total) by mouth daily. Start taking on: Mar 24, 2022 What changed:  how much to take These instructions start on Mar 24, 2022. If you are unsure what to do until then, ask your doctor or other care provider.   torsemide 20 MG tablet Commonly known as: DEMADEX Take 2 tablets (40 mg total) by mouth daily. Start taking on: Mar 24, 2022 What changed: These instructions start on Mar 24, 2022. If you are unsure what to do until then, ask your doctor or other care provider.         Disposition   The patient will be discharged in stable condition to home. Discharge Instructions     (HEART FAILURE PATIENTS) Call MD:  Anytime you have any of the following symptoms: 1) 3 pound weight gain in 24 hours or 5 pounds in 1 week 2) shortness of breath, with or without a dry hacking cough 3) swelling in the hands, feet or stomach 4) if you have to sleep on extra pillows at night in order to breathe.   Complete by: As directed    Diet - low sodium heart healthy   Complete by: As directed    Heart Failure patients record your daily weight using the same scale at the same time of day  Complete by: As  directed    Increase activity slowly   Complete by: As directed    PICC line removal   Complete by: As directed    STOP any activity that causes chest pain, shortness of breath, dizziness, sweating, or exessive weakness   Complete by: As directed        Follow-up Information     [facility] Follow up on 04/03/2022.   Specialty: Cardiology Why: Advanced Heart Failure Clinic 11:30 am Entrance C, Free Valet Parking Contact information: [address] [id number] [place] [phone]                  Duration of Discharge Encounter: Greater than 35 minutes   Signed, [facility], Justina Bertini N  03/22/2022, 9:14 AM

## 2022-03-22 NOTE — Progress Notes (Addendum)
Patient ID: Dale [name], male   DOB: [birth year], 61 y.o.   MRN: [id number]     Advanced Heart Failure Rounding Note  PCP-Cardiologist: Mertie Moores, MD  AHF: Dr. Aundra Dubin   Subjective:    CO-OX 58% off milrinone.   CVP 3.   Off diuretics.   Creatinine 1.44 => 1.53 => 1.97 => 1.85   No weight today.  SBP 90s-100s  Feels well today. Ready to go home. No dyspnea. Reports he has been ambulating halls independently.   [facility] 03/18/22   Prox LAD lesion is 15% stenosed.   Mid LAD lesion is 40% stenosed.   Prox RCA lesion is 100% stenosed.   2nd Mrg lesion is 30% stenosed.   3rd Mrg lesion is 20% stenosed.   Right Heart Pressures RHC Procedural Findings: Hemodynamics (mmHg) RA mean 23 RV 50/21 PA 53/32, mean 40 PCWP mean 39 LV 117/35 AO 117/82  Oxygen saturations: PA 47% AO 97%  Cardiac Output (Fick) 3.5  Cardiac Index (Fick) 1.53 PVR < 1 WU     Objective:   Weight Range: 104.2 kg Body mass index is 32.04 kg/m.   Vital Signs:   Temp:  [97.3 F (36.3 C)-98 F (36.7 C)] 97.3 F (36.3 C) (05/19 0715) Pulse Rate:  [80-95] 92 (05/19 0715) Resp:  [10-22] 20 (05/19 0715) BP: (91-105)/(56-85) 105/78 (05/19 0715) SpO2:  [94 %-97 %] 97 % (05/19 0715) Last BM Date : 03/20/22  Weight change: Filed Weights   03/19/22 0432 03/20/22 0013 03/21/22 0424  Weight: 108.5 kg 105.7 kg 104.2 kg    Intake/Output:   Intake/Output Summary (Last 24 hours) at 03/22/2022 0812 Last data filed at 03/22/2022 0503 Gross per 24 hour  Intake 597 ml  Output 750 ml  Net -153 ml      Physical Exam    General:  No distress. Lying in bed. HEENT: normal Neck: supple. no JVD. Carotids 2+ bilat; no bruits.  Cor: PMI nondisplaced. Regular rate & rhythm. No rubs, gallops or murmurs. Lungs: clear Abdomen: soft, nontender, nondistended.  Extremities: no cyanosis, clubbing, rash, edema, RUE PICC Neuro: alert & orientedx3, cranial nerves grossly intact. moves all 4 extremities  w/o difficulty. Affect pleasant   Telemetry   SR 80s  EKG    N/A   Labs    CBC No results for input(s): WBC, NEUTROABS, HGB, HCT, MCV, PLT in the last 72 hours.  Basic Metabolic Panel Recent Labs    03/21/22 0345 03/22/22 0405  NA 134* 136  K 3.6 3.7  CL 99 100  CO2 25 28  GLUCOSE 173* 105*  BUN 36* 41*  CREATININE 1.97* 1.85*  CALCIUM 8.8* 8.9  MG 2.2 2.4   Liver Function Tests No results for input(s): AST, ALT, ALKPHOS, BILITOT, PROT, ALBUMIN in the last 72 hours.  No results for input(s): LIPASE, AMYLASE in the last 72 hours. Cardiac Enzymes No results for input(s): CKTOTAL, CKMB, CKMBINDEX, TROPONINI in the last 72 hours.  BNP: BNP (last 3 results) Recent Labs    02/19/22 0319 03/13/22 1415 03/18/22 1456  BNP >4,500.0* >4,500.0* 2,105.2*    ProBNP (last 3 results) No results for input(s): PROBNP in the last 8760 hours.   D-Dimer No results for input(s): DDIMER in the last 72 hours. Hemoglobin A1C No results for input(s): HGBA1C in the last 72 hours. Fasting Lipid Panel No results for input(s): CHOL, HDL, LDLCALC, TRIG, CHOLHDL, LDLDIRECT in the last 72 hours. Thyroid Function Tests No results for input(s): TSH, T4TOTAL,  T3FREE, THYROIDAB in the last 72 hours.  Invalid input(s): FREET3  Other results:   Imaging    VAS [place] ABI WITH/WO TBI  Result Date: 03/21/2022  LOWER EXTREMITY DOPPLER STUDY Patient Name:  Dale [name]  Date of Exam:   03/21/2022 Medical Rec #: [id number]            Accession #:    [id number] Date of Birth: 1960-11-16            Patient Gender: M Patient Age:   60 years Exam Location:  [facility] Procedure:      VAS [place] ABI WITH/WO TBI Referring Phys: BRITTAINY SIMMONS --------------------------------------------------------------------------------  Indications: Cramping in feet, difficult to palpate pulses. High Risk Factors: Hypertension, hyperlipidemia, coronary artery disease. Other         Polysubstance abuse  (ETOH, cannabis, Cocaine). CHF with EF of Factors:      10-15%.  Comparison Study: No prior study on file Performing Technologist: Sharion Dove RVS  Examination Guidelines: A complete evaluation includes at minimum, Doppler waveform signals and systolic blood pressure reading at the level of bilateral brachial, anterior tibial, and posterior tibial arteries, when vessel segments are accessible. Bilateral testing is considered an integral part of a complete examination. Photoelectric Plethysmograph (PPG) waveforms and toe systolic pressure readings are included as required and additional duplex testing as needed. Limited examinations for reoccurring indications may be performed as noted.  ABI Findings: +---------+------------------+-----+---------+----------------------------+ Right    Rt Pressure (mmHg)IndexWaveform Comment                      +---------+------------------+-----+---------+----------------------------+ Brachial                        triphasicrestricted secondary to PICC +---------+------------------+-----+---------+----------------------------+ PTA      124               1.23 triphasic                             +---------+------------------+-----+---------+----------------------------+ DP       111               1.10 triphasic                             +---------+------------------+-----+---------+----------------------------+ Great Toe96                0.95                                       +---------+------------------+-----+---------+----------------------------+ +---------+------------------+-----+---------+-------+ Left     Lt Pressure (mmHg)IndexWaveform Comment +---------+------------------+-----+---------+-------+ Brachial 101                    triphasic        +---------+------------------+-----+---------+-------+ PTA      119               1.18 triphasic        +---------+------------------+-----+---------+-------+ DP       124                1.23 triphasic        +---------+------------------+-----+---------+-------+ Great Toe95                0.94                  +---------+------------------+-----+---------+-------+ +-------+-----------+-----------+------------+------------+  ABI/TBIToday's ABIToday's TBIPrevious ABIPrevious TBI +-------+-----------+-----------+------------+------------+ Right  1.2        0.95                                +-------+-----------+-----------+------------+------------+ Left   1.2        0.94                                +-------+-----------+-----------+------------+------------+   Summary: Right: Resting right ankle-brachial index is within normal range. No evidence of significant right lower extremity arterial disease. The right toe-brachial index is normal. Left: Resting left ankle-brachial index is within normal range. No evidence of significant left lower extremity arterial disease. The left toe-brachial index is normal. *See table(s) above for measurements and observations.     Preliminary      Medications:     Scheduled Medications:  aspirin EC  81 mg Oral Daily   atorvastatin  40 mg Oral Daily   Chlorhexidine Gluconate Cloth  6 each Topical Daily   pantoprazole (PROTONIX) IV  40 mg Intravenous Daily   rivaroxaban  10 mg Oral Daily   sodium chloride flush  10-40 mL Intracatheter Q12H   sodium chloride flush  3 mL Intravenous Q12H   sodium chloride flush  3 mL Intravenous Q12H   sodium chloride flush  3 mL Intravenous Q12H    Infusions:  sodium chloride     sodium chloride 10 mL/hr at 03/18/22 1016   sodium chloride     sodium chloride      PRN Medications: sodium chloride, sodium chloride, sodium chloride, acetaminophen, baclofen, ondansetron (ZOFRAN) IV, sodium chloride flush, sodium chloride flush, sodium chloride flush, sodium chloride flush    Patient Profile   61 y/o male w/ chronic systolic heart failure, CAD, HTN and polysubstance  use (including cocaine & alcohol). Came in for outpatient [facility] [place] which showed elevated filling pressures and low output. Admitted for IV diuresis and inotropic support w/ milrinone.   Assessment/Plan   1. 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 He has a history of alcohol and cocaine abuse, which likely contributes to cardiomyopathy.  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 Most recent echo in 4/23 showed EF 10-15%, global HK, severe LV dilation, severely enlarged RV with severely decreased systolic function, severe biatrial enlargement, moderate MR, dilated IVC. LHC/RHC this admit showed low CI 1.53 and markedly high filling pressures, RCA was chronically occluded but he did not have obstructive disease on left.  He says that he has been taking all his meds at home now but had NYHA class IIIb symptoms.  - Milrinone off 05/18. CO-OX 58% this am.  - Diuretics on hold d/t AKI. Scr starting to trend back down, 1.5>1.97>1.85 - Entresto, bidil, spiro, digoxin and farxiga held d/t AKI and hypotension -Add back Farxiga -Add back bidil 1/2 tablet TID -Restart home Torsemide 40 mg daily and KCL 40 mEq daily on 05/21 - Not candidate for ICD unless he can cut back on ETOH abuse.  He has stopped cocaine. Narrow QRS so no CRT.  - Low output HF but with substance abuse, poor compliance, and living situation (basically homeless, lives with friends or in hotels), he is not a candidate for advanced therapies.  Would also be poor home milrinone candidate.   2. CAD: LHC in 7/20 and again today showed CTO of RCA treated  medically, left system ok. Suspect cardiomyopathy is primarily nonischemic.  - Continue ASA 81 daily + atorvastatin 40 mg daily.  3. Cocaine Use Disorder: Has been off cocaine for several years.  4. Alcohol Use Disorder: Urged him to cut back.  5. AKI on CKD stage 3: SCr 1.5>>1.44>>1.53>>1.97>>1.85.  Suspect overdiuresis and hypotension with rise in creatinine.    6. ?PAD: Difficult to palpate pedal pulses, crampy pain in feet.  - Arterial dopplers with normal ABIs 7. SDOH: - He is homeless, staying with a friend.   - Continue paramedicine in the community.    Stable for discharge today. Discussed with Dr. Aundra Dubin.   Has f/u in HF clinic.  Will reach out to HF CSW to get a bus pass for him.   Length of Stay: 4  FINCH, LINDSAY N, PA-C  03/22/2022, 8:12 AM  Advanced Heart Failure Team Pager [phone] (M-F; 7a - 5p)  Please contact [facility] for night-coverage after hours (5p -7a ) and weekends on [email or web address]  Patient seen with PA, agree with the above note.   Co-ox 58% off milrinone.  CVP 3.  Feels good, no dyspnea.  SBP 100s.   General: NAD Neck: No JVD, no thyromegaly or thyroid nodule.  Lungs: Clear to auscultation bilaterally with normal respiratory effort. CV: Nondisplaced PMI.  Heart regular S1/S2, no S3/S4, no murmur.  No peripheral edema.   Abdomen: Soft, nontender, no hepatosplenomegaly, no distention.  Skin: Intact without lesions or rashes.  Neurologic: Alert and oriented x 3.  Psych: Normal affect. Extremities: No clubbing or cyanosis.  HEENT: Normal.   He diuresed well, CVP 3.  Creatinine down to 1.8 after holding meds yesterday.  Co-ox stable off milrinone.   Ok to go home today.  Close CHF clinic followup.  He will go home on ASA 81, atorvastatin 40, dapagliflozin 10, Bidil 1/2 tab tid, digoxin 0.125 daily.  Torsemide 40 daily and KCl 20 daily starting back on Sunday.   Loralie Champagne 03/22/2022 9:11 AM

## 2022-04-02 ENCOUNTER — Telehealth (HOSPITAL_COMMUNITY): Payer: Self-pay

## 2022-04-02 ENCOUNTER — Inpatient Hospital Stay: Payer: Medicaid Other | Admitting: Physician Assistant

## 2022-04-02 NOTE — Telephone Encounter (Signed)
Called to confirm/remind patient of their appointment at the Advanced Heart Failure Clinic on 04/03/22.   Patient reminded to bring all medications and/or complete list.  Confirmed patient has transportation. Gave directions, instructed to utilize valet parking.  Confirmed appointment prior to ending call.   

## 2022-04-03 ENCOUNTER — Encounter (HOSPITAL_COMMUNITY): Payer: Medicaid Other

## 2022-04-04 ENCOUNTER — Inpatient Hospital Stay: Payer: Medicaid Other | Admitting: Physician Assistant

## 2022-04-12 ENCOUNTER — Encounter (HOSPITAL_COMMUNITY): Payer: Medicaid Other

## 2022-04-18 ENCOUNTER — Telehealth (HOSPITAL_COMMUNITY): Payer: Self-pay

## 2022-04-18 NOTE — Telephone Encounter (Signed)
Called and left patient a detailed message to confirm/remind patient of their appointment at the Advanced [facility] Failure Clinic on 04/19/22.

## 2022-04-19 ENCOUNTER — Encounter (HOSPITAL_COMMUNITY): Payer: Self-pay

## 2022-04-19 ENCOUNTER — Other Ambulatory Visit (HOSPITAL_COMMUNITY): Payer: Self-pay

## 2022-04-19 ENCOUNTER — Ambulatory Visit (HOSPITAL_COMMUNITY)
Admission: RE | Admit: 2022-04-19 | Discharge: 2022-04-19 | Disposition: A | Discharge status: Home / Self Care | Payer: Medicaid Other | Source: Ambulatory Visit | Attending: Family Medicine | Admitting: Family Medicine

## 2022-04-19 VITALS — BP 90/60 | HR 86 | Wt 248.6 lb

## 2022-04-19 DIAGNOSIS — Z7902 Long term (current) use of antithrombotics/antiplatelets: Secondary | ICD-10-CM | POA: Diagnosis not present

## 2022-04-19 DIAGNOSIS — N183 Chronic kidney disease, stage 3 unspecified: Secondary | ICD-10-CM | POA: Insufficient documentation

## 2022-04-19 DIAGNOSIS — Z7984 Long term (current) use of oral hypoglycemic drugs: Secondary | ICD-10-CM | POA: Diagnosis not present

## 2022-04-19 DIAGNOSIS — F17200 Nicotine dependence, unspecified, uncomplicated: Secondary | ICD-10-CM | POA: Insufficient documentation

## 2022-04-19 DIAGNOSIS — Z7982 Long term (current) use of aspirin: Secondary | ICD-10-CM | POA: Diagnosis not present

## 2022-04-19 DIAGNOSIS — F1411 Cocaine abuse, in remission: Secondary | ICD-10-CM | POA: Insufficient documentation

## 2022-04-19 DIAGNOSIS — I739 Peripheral vascular disease, unspecified: Secondary | ICD-10-CM

## 2022-04-19 DIAGNOSIS — Z5901 Sheltered homelessness: Secondary | ICD-10-CM | POA: Diagnosis not present

## 2022-04-19 DIAGNOSIS — I272 Pulmonary hypertension, unspecified: Secondary | ICD-10-CM | POA: Insufficient documentation

## 2022-04-19 DIAGNOSIS — Z79899 Other long term (current) drug therapy: Secondary | ICD-10-CM | POA: Insufficient documentation

## 2022-04-19 DIAGNOSIS — I130 Hypertensive heart and chronic kidney disease with heart failure and stage 1 through stage 4 chronic kidney disease, or unspecified chronic kidney disease: Secondary | ICD-10-CM | POA: Insufficient documentation

## 2022-04-19 DIAGNOSIS — I428 Other cardiomyopathies: Secondary | ICD-10-CM | POA: Insufficient documentation

## 2022-04-19 DIAGNOSIS — F1410 Cocaine abuse, uncomplicated: Secondary | ICD-10-CM | POA: Diagnosis not present

## 2022-04-19 DIAGNOSIS — F1010 Alcohol abuse, uncomplicated: Secondary | ICD-10-CM | POA: Insufficient documentation

## 2022-04-19 DIAGNOSIS — I5022 Chronic systolic (congestive) heart failure: Secondary | ICD-10-CM | POA: Diagnosis present

## 2022-04-19 DIAGNOSIS — I2510 Atherosclerotic heart disease of native coronary artery without angina pectoris: Secondary | ICD-10-CM | POA: Diagnosis not present

## 2022-04-19 DIAGNOSIS — Z139 Encounter for screening, unspecified: Secondary | ICD-10-CM

## 2022-04-19 LAB — BASIC METABOLIC PANEL
Anion gap: 9 (ref 5–15)
BUN: 23 mg/dL — ABNORMAL HIGH (ref 6–20)
CO2: 23 mmol/L (ref 22–32)
Calcium: 8.3 mg/dL — ABNORMAL LOW (ref 8.9–10.3)
Chloride: 108 mmol/L (ref 98–111)
Creatinine, Ser: 1.45 mg/dL — ABNORMAL HIGH (ref 0.61–1.24)
GFR, Estimated: 56 mL/min — ABNORMAL LOW (ref >60)
Glucose, Bld: 74 mg/dL (ref 70–99)
Potassium: 4 mmol/L (ref 3.5–5.1)
Sodium: 140 mmol/L (ref 135–145)

## 2022-04-19 LAB — BRAIN NATRIURETIC PEPTIDE: B Natriuretic Peptide: 1450 pg/mL — ABNORMAL HIGH (ref 0.0–100.0)

## 2022-04-19 MED ORDER — POTASSIUM CHLORIDE CRYS ER 20 MEQ PO TBCR
20.0000 meq | EXTENDED_RELEASE_TABLET | Freq: Two times a day (BID) | ORAL | 6 refills | Status: DC
  Filled 2022-04-19: qty 60, 30d supply, fill #0

## 2022-04-19 MED ORDER — METOLAZONE 2.5 MG PO TABS
2.5000 mg | ORAL_TABLET | Freq: Once | ORAL | 0 refills | Status: DC
  Filled 2022-04-19: qty 1, 1d supply, fill #0

## 2022-04-19 MED ORDER — TORSEMIDE 20 MG PO TABS
40.0000 mg | ORAL_TABLET | Freq: Two times a day (BID) | ORAL | 5 refills | Status: DC
Start: 2022-04-19 — End: 2022-05-03
  Filled 2022-04-19: qty 120, 30d supply, fill #0

## 2022-04-19 NOTE — Progress Notes (Signed)
Heart and Vascular Care Navigation  04/19/2022  Audi Wettstein [facility] [birth year] [id number]  Reason for Referral: paramedicine referral   Engaged with patient face to face for initial visit for Heart and Vascular Care Coordination.                                                                                                   Assessment:    Paramedicine Initial Assessment:  Housing:  In what kind of housing do you live? House/apt/trailer/shelter? apt  Do you rent/pay a mortgage/own? rent  Do you live with anyone? Lives with a friend in [place] but also lives on and off with mom in [place] which we have listed as his address in our system.  Are you currently worried about losing your housing? Expresses some concerns that his friend or his mom will eventually no want him staying with them but states he is working on getting his own housing.  Social:  What is your current marital status? single  Do you have any children? Dtr and son who live in [place]  Food:  Food stamps just got cut to $60  Income:  What is your current source of income? Gets SSI- $914/month   Insurance:  Are you currently insured? Medicaid  Do you have prescription coverage? yes  Transportation:  Do you have transportation to your medical appointments? Does not have his own car- uses the bus or sometimes has friend take him.  CSW informed pt of benefit through Medicaid to get transportation and how to sign up- unsure if him living outside of [facility] where his medicaid is based will affect his ability to get transport set up.     Needs bus passes for next appt- 2 provided  Daily Health Needs: Do you have a working scale at home? yes  How do you manage your medications at home? pillbox- fills up his own pill box and reports he feels confident in doing so- some concerns by physician as to compliance due to volume overload today.  Do you have issues affording your medications? Reports yes-  assisted with paying copay today but will [facility]  If yes, has this ever prevented you from obtaining medications? no  Do you have any concerns with mobility at home?- uses a walker  Do you have a PCP? no  Do you have any trouble reading or writing? Not assessed  Are there any additional barriers you see to getting the care you need? Not at this time                                 HRT/VAS Care Coordination     Patients Home Cardiology Office Heart Failure Clinic   Outpatient Care Team Community Paramedicine   Social Worker Name: [name], [place] [phone]   Living arrangements for the past 2 months Apartment   Lives with: Friends   Patient Current Insurance Coverage Medicaid   Patient Has Concern With Paying Medical Bills  No   Does Patient Have Prescription Coverage? Yes   Home Assistive Devices/Equipment Cane (specify quad or straight)   DME Agency [employer]   HH Agency NA   Current home services --  rollator, scale       Social History:                                                                             SDOH Screenings   Alcohol Screen: Medium Risk (02/20/2022)   Alcohol Screen    Last Alcohol Screening Score (AUDIT): 24  Depression (PHQ2-9): Medium Risk (05/05/2019)   Depression (PHQ2-9)    PHQ-2 Score: 14  Financial Resource Strain: High Risk (02/20/2022)   Overall Financial Resource Strain (CARDIA)    Difficulty of Paying Living Expenses: Hard  Food Insecurity: Food Insecurity Present (04/19/2022)   Hunger Vital Sign    Worried About Running Out of Food in the Last Year: Sometimes true    Ran Out of Food in the Last Year: Sometimes true  Housing: High Risk (03/13/2022)   Housing    Last Housing Risk Score: 4  Physical Activity: Not on file  Social Connections: Not on file  Stress: Not on file  Tobacco Use: Low Risk  (04/19/2022)   Patient History    Smoking Tobacco Use: Never    Smokeless  Tobacco Use: Never    Passive Exposure: Not on file  Transportation Needs: Unmet Transportation Needs (04/19/2022)   PRAPARE - Administrator, Civil Service (Medical): Yes    Lack of Transportation (Non-Medical): Yes    SDOH Interventions: Financial Resources:    Gets SSI  Food Insecurity:  Food Insecurity Interventions: Other (Comment) (list of food pantries given)  Housing Insecurity:   Unstably housed- working on applying for housing  Transportation:   Transportation Interventions: Payor Benefit, [employer] Given     Follow-up plan:     Referral sent to paramedics for assignment and follow up     Burna Sis, LCSW Clinical Social Worker Advanced Heart Failure Clinic Desk#: [phone] Cell#: [phone]

## 2022-04-19 NOTE — Progress Notes (Signed)
ReDS Vest / Clip - 04/19/22 1500       ReDS Vest / Clip   Station Marker C    Ruler Value 30    ReDS Value Range High volume overload    ReDS Actual Value 47

## 2022-04-19 NOTE — Progress Notes (Signed)
ADVANCED HF CLINIC NOTE  PCP: None HF Cardiologist: Dr. [name]  HPI: Dale [name] is a 61 y.o. male  with a PMH significant for hypertension, polysubstance use (including cocaine & alcohol), chronic combined CHF, and CAD.   He was admitted 11/19 with signs/symptoms of CHF, in the setting of poorly controlled hypertension alcohol and cocaine use. He was started on IV Lasix and echo showed EF 20%, G2 DD.  He left AMA.   Readmitted 12/19 with CHF exacerbation, started on IV diuretics, beta-blocker, ARB and BiDil.  Discharged on oral torsemide.  He remained positive for cocaine on UDS.   Admitted 4/20 with chest pain and CHF. Diagnosed with NSTEMI. He declined cardiac catheterization.     Admitted 5/20 with CHF and chest pain, still using cocaine, but no evidence of ischemia.  Diuresed and discharged.  Echo (5/20) EF 20 to 25%, elevated LVEDP, diffuse hypokinesis with worse hypokinesis and inferior wall moderately enlarged RV with normal function.  Admitted in 7/20 with acute CHF and underwent L/RHC showing severe single vessel CAD with CTO of pRCA, mild to moderate non-obs CAD involving LAD and LCx, moderately elevated left heart filling pressures, severely elevated right heart filling pressures, mild pulmonary hypertension and normal CO/CI by Fick. He was treated medically.  2 CHF admissions 2021. 6 admissions in 2022.    Admitted 7/22 with NSTEMI and troponin > 1000,  but treated medically due to continued cocaine use and concern for poor medication adherence if stent was needed.   Admitted 8/22 with CHF exacerbation.  Echo with EF <10%, medications restarted. Farxiga and spiro added.    He was seen in [facility] 9/22, doing well from CHF standpoint and compliant with medication regimen.   Admitted again in 4/23 with CHF, refused PICC line and refused RHC/LHC.  Concern for low output HF with AKI/creatinine up to 2.  Patient was started on milrinone empirically with good diuresis,  creatinine down to about 1.7. Echo this admission showed EF 10-15%, global HK, severe LV dilation, severely enlarged RV with severely decreased systolic function, severe biatrial enlargement, moderate MR, dilated IVC.   Follow up 5/23, out of all meds except Bidil x 5 days and was significantly volume overloaded. Arranged for outpatient RHC/LHC, and admitted with elevated filling pressures and low output, CI 1.53. RCA was chronically occluded but he did not have obstructive disease on left. Started on IV lasix and milrinone. Able to wean drips and titrate GDMT. Discharged to a friend's house, weight 248 lbs.  Today he returns for post hospital HF follow up. He is more short of breath and has dizziness. He has new orthopnea. Denies palpitations, CP, abnormal bleeding, edema, or PND/Orthopnea. Appetite ok. No fever or chills. Weight at home 238-240 pounds. Taking all medications. Staying with a friend. Smokes THC, ETOH 2x/month, no longer using cocaine. Drinks a lot of fluid during the day.  ECG (personally reviewed): SR with 1st degree AVB, 76 bpm  Labs (9/22): K 3.9, creatinine 1.67 Labs (4/23): K 3.6, creatinine 1.69, hgb 12.8, LDL 87 Labs (5/23): K 3.7, creatinine 1.85  Review of Systems:  All systems reviewed and negative except as per HPI.   PMH: 1. ETOH abuse.  2. Cocaine abuse: Has been off cocaine x months.  3. CAD: Cath in 7/20 with totally occluded RCA with collaterals.  - NSTEMI in 7/22 with HS-TnI > 1000 but no cath.  4. CKD stage 3 5. HTN 6. Hyperlipidemia 7. Chronic systolic CHF: Suspect mixed ischemic/nonischemic  cardiomyopathy.  See cath report as above from 7/20.  He continues to drink ETOH heavily, used cocaine in the past.  Both could contribute.  - Echo (4/23): EF 10-15%, global HK, severe LV dilation, severely enlarged RV with severely decreased systolic function, severe biatrial enlargement, moderate MR, dilated IVC - R/LHC (5/23): occluded pRCA w/ collaterals,  nonobstructive disease in the left system, markedly elevated filling pressures,  pulmonary venous hypertension, low CO. RA mean 23, RV 50/21, PA 53/32, mean 40, PCWP mean 39, LV 117/35, AO 117/82, CO/CI (Fick): 3.5/1.53, PVR < 1 WU, Oxygen saturations: PA 47%, AO 97%   Current Outpatient Medications  Medication Sig Dispense Refill   aspirin 81 MG EC tablet Take 1 tablet (81 mg total) by mouth daily. Swallow whole. 30 tablet 2   atorvastatin (LIPITOR) 40 MG tablet Take 1 tablet (40 mg total) by mouth daily. 30 tablet 2   dapagliflozin propanediol (FARXIGA) 10 MG TABS tablet Take 1 tablet (10 mg total) by mouth daily. 30 tablet 2   digoxin (LANOXIN) 0.125 MG tablet Take 1 tablet (0.125 mg total) by mouth daily. 30 tablet 2   isosorbide-hydrALAZINE (BIDIL) 20-37.5 MG tablet Take 0.5 tablets by mouth 3 (three) times daily. 90 tablet 2   potassium chloride SA (KLOR-CON M) 20 MEQ tablet Take 20 mEq by mouth daily.     torsemide (DEMADEX) 20 MG tablet Take 2 tablets (40 mg total) by mouth daily. 60 tablet 5   No current facility-administered medications for this encounter.   Allergies  Allergen Reactions   Hydrocodone Itching   Social History   Socioeconomic History   Marital status: Single    Spouse name: none   Number of children: Not on file   Years of education: Not on file   Highest education level: Not on file  Occupational History   Occupation: Disabled  Tobacco Use   Smoking status: Never   Smokeless tobacco: Never  Vaping Use   Vaping Use: Never used  Substance and Sexual Activity   Alcohol use: Yes    Comment: 1 quarts a week   Drug use: Not Currently    Frequency: 3.0 times per week    Types: Marijuana, Cocaine   Sexual activity: Not Currently  Other Topics Concern   Not on file  Social History Narrative   Lives w/ mother in [place] but recently visiting w/ dtr in [place].   Social Determinants of Health   Financial Resource Strain: High Risk (02/20/2022)   Overall  Financial Resource Strain (CARDIA)    Difficulty of Paying Living Expenses: Hard  Food Insecurity: Food Insecurity Present (03/13/2022)   Hunger Vital Sign    Worried About Programme researcher, broadcasting/film/video in the Last Year: Often true    Ran Out of Food in the Last Year: Often true  Transportation Needs: Unmet Transportation Needs (03/13/2022)   PRAPARE - Administrator, Civil Service (Medical): Yes    Lack of Transportation (Non-Medical): Yes  Physical Activity: Not on file  Stress: Not on file  Social Connections: Not on file  Intimate Partner Violence: Not on file   Family History  Problem Relation Age of Onset   Hypertension Maternal Grandmother    BP 90/60   Pulse 86   Wt 112.8 kg (248 lb 9.6 oz)   SpO2 96%   BMI 34.67 kg/m   Wt Readings from Last 3 Encounters:  04/19/22 112.8 kg (248 lb 9.6 oz)  03/21/22 104.2 kg (229 lb 11.2  oz)  03/13/22 110.3 kg (243 lb 3.2 oz)   PHYSICAL EXAM: General:  NAD. No resp difficulty HEENT: Normal Neck: Supple. JVP to jaw. Carotids 2+ bilat; no bruits. No lymphadenopathy or thryomegaly appreciated. Cor: PMI nondisplaced. Regular rate & rhythm. No rubs, gallops or murmurs. Lungs: Clear Abdomen: Obese, soft, nontender, nondistended. No hepatosplenomegaly. No bruits or masses. Good bowel sounds. Extremities: No cyanosis, clubbing, rash, 2+ BLE edema Neuro: Alert & oriented x 3, cranial nerves grossly intact. Moves all 4 extremities w/o difficulty. Affect pleasant.  ASSESSMENT & PLAN: 1. 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 He has a history of alcohol and cocaine abuse, which likely contributes to cardiomyopathy.  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 Most recent echo in 4/23 showed EF 10-15%, global HK, severe LV dilation, severely enlarged RV with severely decreased systolic function, severe biatrial enlargement, moderate MR, dilated IVC. LHC/RHC this admit showed low CI 1.53 and markedly high filling  pressures, RCA was chronically occluded but he did not have obstructive disease on left. Worse NYHA III-IIIb, he is volume overloaded on exam, REDs 47%. He drinks a lot of water during the day. - With low BP, stop BiDil. - Increase torsemide to 40 mg bid and increase KCL to 20 bid. - Take metolazone 2.5 mg + extra 40 KCL today x 1 with afternoon torsemide dose today. - Continue digoxin 0.125 mg daily. Check level next visit as a trough. - Continue Farxiga 10 mg daily. - Not candidate for ICD unless he can cut back on ETOH abuse.  He has stopped cocaine. Narrow QRS so no CRT.  - Low output HF but with substance abuse, poor compliance, and living situation (basically homeless, lives with friends or in hotels), he is not a candidate for advanced therapies.  Would also be poor home milrinone candidate.   2. CAD: LHC in 7/20 and 5/23 showed CTO of RCA treated medically, left system ok. Suspect cardiomyopathy is primarily nonischemic. No chest pain. - Continue ASA 81 daily + atorvastatin 40 mg daily.  3. Cocaine Use Disorder: Has been off cocaine for several years.  4. Alcohol Use Disorder: Urged him to cut back.  5. CKD stage 3: BMET today. 6. ? PAD: Continues to have discomfort in feet when walking. - Arterial dopplers with normal ABIs 7. SDOH: - He is homeless, staying with a friend.   - Arrange paramedicine to help with med changes. ? Compliance issues.  Follow up in 10-14 days with APP to re-check fluid (check Dig trough and REDs), and 3 months with Dr. Aundra Dubin.  Allena Katz, FNP-BC 04/19/22

## 2022-04-19 NOTE — Patient Instructions (Addendum)
Thank you for coming in today  Labs were done today, if any labs are abnormal the clinic will call you No news is good news  STOP Bidil  You have been referred to Paramedicine  INCREASE Torsemide to 40 mg twice daily  INCREASE Potassium to 20 meq twcie daily   TAKE Metolazone 2.5 mg 1 tablet once with extra Potassium 40 meq   Your physician recommends that you schedule a follow-up appointment in:  10-14 days in clinic   At the [facility], you and your health needs are our priority. As part of our continuing mission to provide you with exceptional heart care, we have created designated Provider Care Teams. These Care Teams include your primary Cardiologist (physician) and Advanced Practice Providers (APPs- Physician Assistants and Nurse Practitioners) who all work together to provide you with the care you need, when you need it.   You may see any of the following providers on your designated Care Team at your next follow up: Dr Arvilla Meres Dr Carron Curie, NP Robbie Lis, [place] [facility] [place], [place] Karle Plumber, PharmD   Please be sure to bring in all your medications bottles to every appointment.   If you have any questions or concerns before your next appointment please send [place] a message through [place] or call our office at [phone].    TO LEAVE A MESSAGE FOR THE NURSE SELECT OPTION 2, PLEASE LEAVE A MESSAGE INCLUDING: YOUR NAME DATE OF BIRTH CALL BACK NUMBER REASON FOR CALL**this is important as we prioritize the call backs  YOU WILL RECEIVE A CALL BACK THE SAME DAY AS LONG AS YOU CALL BEFORE 4:00 PM

## 2022-04-25 ENCOUNTER — Other Ambulatory Visit (HOSPITAL_COMMUNITY): Payer: Self-pay

## 2022-04-25 ENCOUNTER — Other Ambulatory Visit (HOSPITAL_COMMUNITY): Payer: Self-pay | Admitting: Emergency Medicine
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
## 2022-04-30 ENCOUNTER — Telehealth (HOSPITAL_COMMUNITY): Payer: Self-pay

## 2022-05-01 ENCOUNTER — Encounter (HOSPITAL_COMMUNITY): Payer: Medicaid Other

## 2022-05-02 ENCOUNTER — Other Ambulatory Visit (HOSPITAL_COMMUNITY): Payer: Self-pay | Admitting: Emergency Medicine

## 2022-05-02 NOTE — Progress Notes (Signed)
Paramedicine Encounter    Patient ID: Dale [name], male    DOB: Dec 14, 1960, 61 y.o.   MRN: [id number]   BP 110/80 (BP Location: Left Arm, Patient Position: Sitting, Cuff Size: Normal)   Pulse 84   Resp 16   Wt 240 lb (108.9 kg)   SpO2 97%   BMI 33.47 kg/m  Weight yesterday-241lb Last visit weight-243lb CBG 95  Home visit with Dale [name] today.  He denies chest pain or SOB.  He does have significant edema to his ankles.  Lung sounds clear and equal bilat.  He missed 3 evening doses of his Torsemide and Potassium this week.  He also complains having the hiccups for weeks.    I did not notice them on my last visit like today, they are very frequent and noticeable.  I recommended he reach out to his PCP for this complaint.  He missed his appointment yesterday for lab work at the clinic.  I reached out for med changes and none required at this time per Anna Genre.  We were able to reschedule his clinic/lab visit for tomorrow @ 10:30. He is also to get a prescription for compression socks at this visit.   Medications reconciled without incident.  No refills needed at this time. Home visit complete  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) 06/13/2021   CHF (congestive heart failure) (HCC) 06/13/2021   Acute on chronic systolic CHF (congestive heart failure), NYHA class 4 (HCC)    CKD (chronic kidney disease), stage III (HCC)  06/01/2021   Hypokalemia    Acute on chronic HFrEF (heart failure with reduced ejection fraction) (HCC) 12/25/2020   Coronary artery disease involving native coronary artery of native heart without angina pectoris    Acute kidney injury superimposed on chronic kidney disease (HCC) 10/16/2020   Acute on chronic combined systolic and diastolic HF (heart failure) (HCC) 09/08/2020   Acute on chronic combined systolic (congestive) and diastolic (congestive) heart failure (HCC)    Acute CHF (congestive heart failure) (HCC) 05/07/2019   Nausea & vomiting 05/07/2019   Acute exacerbation of CHF (congestive heart failure) (HCC) 03/11/2019   Troponin level elevated 03/11/2019   Hyperlipidemia 03/03/2019   NSVT (nonsustained ventricular tachycardia) (HCC) 03/03/2019   Anxiety and depression    NSTEMI (non-ST elevated myocardial infarction) (HCC) 02/28/2019   Acute systolic heart failure (HCC) 11/08/2018   Hypertensive urgency 09/28/2018   Acute pulmonary edema (HCC) 09/28/2018   Cocaine abuse (HCC) 09/28/2018   Substance abuse (HCC) 09/28/2018   Chronic kidney disease, stage II (mild) 09/28/2018   Normochromic anemia 09/28/2018   Acute systolic CHF (congestive heart failure) (HCC) 09/27/2018   Essential hypertension 03/11/2016   Obesity (BMI 30-39.9) 03/11/2016   Glucosuria 03/11/2016    Current Outpatient Medications:    aspirin 81 MG EC tablet, Take 1 tablet (81 mg total) by mouth daily. Swallow whole., Disp: 30 tablet, Rfl: 2   atorvastatin (LIPITOR) 40  MG tablet, Take 1 tablet (40 mg total) by mouth daily., Disp: 30 tablet, Rfl: 2   dapagliflozin propanediol (FARXIGA) 10 MG TABS tablet, Take 1 tablet (10 mg total) by mouth daily., Disp: 30 tablet, Rfl: 2   digoxin (LANOXIN) 0.125 MG tablet, Take 1 tablet (0.125 mg total) by mouth daily., Disp: 30 tablet, Rfl: 2   metolazone (ZAROXOLYN) 2.5 MG tablet, Take 1 tablet (2.5 mg total) by mouth once for 1 dose., Disp: 1 tablet, Rfl: 0   potassium  chloride SA (KLOR-CON M) 20 MEQ tablet, Take 1 tablet (20 mEq total) by mouth 2 (two) times daily., Disp: 60 tablet, Rfl: 6   torsemide (DEMADEX) 20 MG tablet, Take 2 tablets (40 mg total) by mouth 2 (two) times daily., Disp: 120 tablet, Rfl: 5 Allergies  Allergen Reactions   Hydrocodone Itching      Social History   Socioeconomic History   Marital status: Single    Spouse name: none   Number of children: Not on file   Years of education: Not on file   Highest education level: Not on file  Occupational History   Occupation: Disabled  Tobacco Use   Smoking status: Never   Smokeless tobacco: Never  Vaping Use   Vaping Use: Never used  Substance and Sexual Activity   Alcohol use: Yes    Comment: 1 quarts a week   Drug use: Not Currently    Frequency: 3.0 times per week    Types: Marijuana, Cocaine   Sexual activity: Not Currently  Other Topics Concern   Not on file  Social History Narrative   Lives w/ mother in [place] but recently visiting w/ dtr in [place].   Social Determinants of Health   Financial Resource Strain: High Risk (02/20/2022)   Overall Financial Resource Strain (CARDIA)    Difficulty of Paying Living Expenses: Hard  Food Insecurity: Food Insecurity Present (04/19/2022)   Hunger Vital Sign    Worried About Running Out of Food in the Last Year: Sometimes true    Ran Out of Food in the Last Year: Sometimes true  Transportation Needs: Unmet Transportation Needs (04/19/2022)   PRAPARE - Administrator, Civil Service (Medical): Yes    Lack of Transportation (Non-Medical): Yes  Physical Activity: Not on file  Stress: Not on file  Social Connections: Not on file  Intimate Partner Violence: Not on file    Physical Exam      Future Appointments  Date Time Provider Department Center  05/24/2022  8:50 AM Claiborne Rigg, NP [facility] None  05/30/2022 11:00 AM [facility] PA/NP [facility] None       [name], EMT-Paramedic [phone] [facility] Paramedic  05/02/22

## 2022-05-03 ENCOUNTER — Other Ambulatory Visit (HOSPITAL_COMMUNITY): Payer: Self-pay | Admitting: Emergency Medicine

## 2022-05-03 ENCOUNTER — Other Ambulatory Visit (HOSPITAL_COMMUNITY): Payer: Self-pay

## 2022-05-03 ENCOUNTER — Ambulatory Visit (HOSPITAL_COMMUNITY)
Admission: RE | Admit: 2022-05-03 | Discharge: 2022-05-03 | Disposition: A | Discharge status: Home / Self Care | Payer: Medicaid Other | Source: Ambulatory Visit | Attending: Family Medicine | Admitting: Family Medicine

## 2022-05-03 ENCOUNTER — Encounter (HOSPITAL_COMMUNITY): Payer: Self-pay

## 2022-05-03 VITALS — BP 94/74 | HR 79 | Wt 239.6 lb

## 2022-05-03 DIAGNOSIS — F1410 Cocaine abuse, uncomplicated: Secondary | ICD-10-CM | POA: Diagnosis not present

## 2022-05-03 DIAGNOSIS — I2510 Atherosclerotic heart disease of native coronary artery without angina pectoris: Secondary | ICD-10-CM | POA: Insufficient documentation

## 2022-05-03 DIAGNOSIS — I5022 Chronic systolic (congestive) heart failure: Secondary | ICD-10-CM | POA: Diagnosis not present

## 2022-05-03 DIAGNOSIS — I272 Pulmonary hypertension, unspecified: Secondary | ICD-10-CM | POA: Insufficient documentation

## 2022-05-03 DIAGNOSIS — M7989 Other specified soft tissue disorders: Secondary | ICD-10-CM | POA: Insufficient documentation

## 2022-05-03 DIAGNOSIS — F1021 Alcohol dependence, in remission: Secondary | ICD-10-CM | POA: Diagnosis not present

## 2022-05-03 DIAGNOSIS — I130 Hypertensive heart and chronic kidney disease with heart failure and stage 1 through stage 4 chronic kidney disease, or unspecified chronic kidney disease: Secondary | ICD-10-CM | POA: Insufficient documentation

## 2022-05-03 DIAGNOSIS — F1010 Alcohol abuse, uncomplicated: Secondary | ICD-10-CM | POA: Diagnosis not present

## 2022-05-03 DIAGNOSIS — Z5901 Sheltered homelessness: Secondary | ICD-10-CM | POA: Diagnosis not present

## 2022-05-03 DIAGNOSIS — I739 Peripheral vascular disease, unspecified: Secondary | ICD-10-CM

## 2022-05-03 DIAGNOSIS — Z8249 Family history of ischemic heart disease and other diseases of the circulatory system: Secondary | ICD-10-CM | POA: Diagnosis not present

## 2022-05-03 DIAGNOSIS — Z79899 Other long term (current) drug therapy: Secondary | ICD-10-CM | POA: Diagnosis not present

## 2022-05-03 DIAGNOSIS — I252 Old myocardial infarction: Secondary | ICD-10-CM | POA: Insufficient documentation

## 2022-05-03 DIAGNOSIS — Z7982 Long term (current) use of aspirin: Secondary | ICD-10-CM | POA: Diagnosis not present

## 2022-05-03 DIAGNOSIS — Z5982 Transportation insecurity: Secondary | ICD-10-CM | POA: Insufficient documentation

## 2022-05-03 DIAGNOSIS — Z7984 Long term (current) use of oral hypoglycemic drugs: Secondary | ICD-10-CM | POA: Insufficient documentation

## 2022-05-03 DIAGNOSIS — N183 Chronic kidney disease, stage 3 unspecified: Secondary | ICD-10-CM | POA: Diagnosis not present

## 2022-05-03 DIAGNOSIS — F1290 Cannabis use, unspecified, uncomplicated: Secondary | ICD-10-CM | POA: Insufficient documentation

## 2022-05-03 DIAGNOSIS — R0602 Shortness of breath: Secondary | ICD-10-CM | POA: Diagnosis not present

## 2022-05-03 DIAGNOSIS — Z139 Encounter for screening, unspecified: Secondary | ICD-10-CM

## 2022-05-03 DIAGNOSIS — F1411 Cocaine abuse, in remission: Secondary | ICD-10-CM | POA: Diagnosis not present

## 2022-05-03 LAB — BASIC METABOLIC PANEL
Anion gap: 10 (ref 5–15)
BUN: 32 mg/dL — ABNORMAL HIGH (ref 6–20)
CO2: 27 mmol/L (ref 22–32)
Calcium: 8.6 mg/dL — ABNORMAL LOW (ref 8.9–10.3)
Chloride: 105 mmol/L (ref 98–111)
Creatinine, Ser: 1.94 mg/dL — ABNORMAL HIGH (ref 0.61–1.24)
GFR, Estimated: 39 mL/min — ABNORMAL LOW (ref >60)
Glucose, Bld: 92 mg/dL (ref 70–99)
Potassium: 3.5 mmol/L (ref 3.5–5.1)
Sodium: 142 mmol/L (ref 135–145)

## 2022-05-03 LAB — BRAIN NATRIURETIC PEPTIDE: B Natriuretic Peptide: 1318.3 pg/mL — ABNORMAL HIGH (ref 0.0–100.0)

## 2022-05-03 MED ORDER — TORSEMIDE 20 MG PO TABS
40.0000 mg | ORAL_TABLET | Freq: Two times a day (BID) | ORAL | 5 refills | Status: DC
  Filled 2022-05-03: qty 120, 30d supply, fill #0

## 2022-05-03 NOTE — Progress Notes (Signed)
ReDS Vest / Clip - 05/03/22 1000       ReDS Vest / Clip   Station Marker C    Ruler Value 26    ReDS Value Range High volume overload    ReDS Actual Value 42

## 2022-05-03 NOTE — Progress Notes (Signed)
Visited Mr. Brosky to make med changes per [facility].  Increased Torsemide to 80mg . BID x 3 days starting Saturday w/ extra 40mg  Potassium x 3 days.  He will return to his normal dosing after the 3 days have ended.  Reviewed with pt his Digoxin and told him he is no not take it the morning of his next appointment 05/13/22.

## 2022-05-03 NOTE — Patient Instructions (Signed)
Thank you for coming in today  Labs were done today, if any labs are abnormal the clinic will call you No news is good news  You have been given a prescription for compression hose  INCREASE Torsemide to 80 mg twice daily with 40 meq of Potassium for 3 days Then back to 40 mg twice daily   Your physician recommends that you schedule a follow-up appointment in:  10 days BMET   Your physician recommends that you schedule a follow-up appointment in:  3-4 weeks in clinic 3 months with Dr. [name]  At the [facility], you and your health needs are our priority. As part of our continuing mission to provide you with exceptional heart care, we have created designated Provider Care Teams. These Care Teams include your primary Cardiologist (physician) and Advanced Practice Providers (APPs- Physician Assistants and Nurse Practitioners) who all work together to provide you with the care you need, when you need it.   You may see any of the following providers on your designated Care Team at your next follow up: Dr Arvilla Meres Dr Carron Curie, NP Robbie Lis, [place] [facility] [place], [place] Karle Plumber, PharmD   Please be sure to bring in all your medications bottles to every appointment.   If you have any questions or concerns before your next appointment please send [place] a message through [place] or call our office at [phone].    TO LEAVE A MESSAGE FOR THE NURSE SELECT OPTION 2, PLEASE LEAVE A MESSAGE INCLUDING: YOUR NAME DATE OF BIRTH CALL BACK NUMBER REASON FOR CALL**this is important as we prioritize the call backs  YOU WILL RECEIVE A CALL BACK THE SAME DAY AS LONG AS YOU CALL BEFORE 4:00 PM

## 2022-05-03 NOTE — Progress Notes (Signed)
ADVANCED HF CLINIC NOTE  PCP: None HF Cardiologist: Dr. [name]  HPI: Dale [name] is a 61 y.o. male  with a PMH significant for hypertension, polysubstance use (including cocaine & alcohol), chronic combined CHF, and CAD.   He was admitted 11/19 with signs/symptoms of CHF, in the setting of poorly controlled hypertension alcohol and cocaine use. He was started on IV Lasix and echo showed EF 20%, G2 DD.  He left AMA.   Readmitted 12/19 with CHF exacerbation, started on IV diuretics, beta-blocker, ARB and BiDil.  Discharged on oral torsemide.  He remained positive for cocaine on UDS.   Admitted 4/20 with chest pain and CHF. Diagnosed with NSTEMI. He declined cardiac catheterization.     Admitted 5/20 with CHF and chest pain, still using cocaine, but no evidence of ischemia.  Diuresed and discharged.  Echo (5/20) EF 20 to 25%, elevated LVEDP, diffuse hypokinesis with worse hypokinesis and inferior wall moderately enlarged RV with normal function.  Admitted in 7/20 with acute CHF and underwent L/RHC showing severe single vessel CAD with CTO of pRCA, mild to moderate non-obs CAD involving LAD and LCx, moderately elevated left heart filling pressures, severely elevated right heart filling pressures, mild pulmonary hypertension and normal CO/CI by Fick. He was treated medically.  2 CHF admissions 2021. 6 admissions in 2022.    Admitted 7/22 with NSTEMI and troponin > 1000,  but treated medically due to continued cocaine use and concern for poor medication adherence if stent was needed.   Admitted 8/22 with CHF exacerbation.  Echo with EF <10%, medications restarted. Farxiga and spiro added.    He was seen in [facility] 9/22, doing well from CHF standpoint and compliant with medication regimen.   Admitted again in 4/23 with CHF, refused PICC line and refused RHC/LHC.  Concern for low output HF with AKI/creatinine up to 2.  Patient was started on milrinone empirically with good diuresis,  creatinine down to about 1.7. Echo this admission showed EF 10-15%, global HK, severe LV dilation, severely enlarged RV with severely decreased systolic function, severe biatrial enlargement, moderate MR, dilated IVC.   Follow up 5/23, out of all meds except Bidil x 5 days and was significantly volume overloaded. Arranged for outpatient RHC/LHC, and admitted with elevated filling pressures and low output, CI 1.53. RCA was chronically occluded but he did not have obstructive disease on left. Started on IV lasix and milrinone. Able to wean drips and titrate GDMT. Discharged to a friend's house, weight 248 lbs.  Today he returns for HF follow up. Overall feeling fair. Main complaint is ankle and leg swelling. Drinking a lot of fluids during the day. Missing doses of meds throughout the week. Mild SOB with exertion. Denies palpitations, CP, dizziness, or PND/Orthopnea. Appetite ok. No fever or chills. Followed by paramedicine. No further cocaine or ETOH use. Smokes THC daily.  ECG (personally reviewed): none ordered today.  Labs (9/22): K 3.9, creatinine 1.67 Labs (4/23): K 3.6, creatinine 1.69, hgb 12.8, LDL 87 Labs (5/23): K 3.7, creatinine 1.85 Labs (6/23): K 4.0, creatinine 1.45  Review of Systems:  All systems reviewed and negative except as per HPI.   PMH: 1. ETOH abuse.  2. Cocaine abuse: Has been off cocaine x months.  3. CAD: Cath in 7/20 with totally occluded RCA with collaterals.  - NSTEMI in 7/22 with HS-TnI > 1000 but no cath.  4. CKD stage 3 5. HTN 6. Hyperlipidemia 7. Chronic systolic CHF: Suspect mixed ischemic/nonischemic cardiomyopathy.  See cath report as above from 7/20.  He continues to drink ETOH heavily, used cocaine in the past.  Both could contribute.  - Echo (4/23): EF 10-15%, global HK, severe LV dilation, severely enlarged RV with severely decreased systolic function, severe biatrial enlargement, moderate MR, dilated IVC - R/LHC (5/23): occluded pRCA w/ collaterals,  nonobstructive disease in the left system, markedly elevated filling pressures,  pulmonary venous hypertension, low CO. RA mean 23, RV 50/21, PA 53/32, mean 40, PCWP mean 39, LV 117/35, AO 117/82, CO/CI (Fick): 3.5/1.53, PVR < 1 WU, Oxygen saturations: PA 47%, AO 97%  ROS: All systems reviewed and negative except as per HPI.   Current Outpatient Medications  Medication Sig Dispense Refill   aspirin 81 MG EC tablet Take 1 tablet (81 mg total) by mouth daily. Swallow whole. 30 tablet 2   atorvastatin (LIPITOR) 40 MG tablet Take 1 tablet (40 mg total) by mouth daily. 30 tablet 2   dapagliflozin propanediol (FARXIGA) 10 MG TABS tablet Take 1 tablet (10 mg total) by mouth daily. 30 tablet 2   digoxin (LANOXIN) 0.125 MG tablet Take 1 tablet (0.125 mg total) by mouth daily. 30 tablet 2   potassium chloride SA (KLOR-CON M) 20 MEQ tablet Take 1 tablet (20 mEq total) by mouth 2 (two) times daily. 60 tablet 6   torsemide (DEMADEX) 20 MG tablet Take 2 tablets (40 mg total) by mouth 2 (two) times daily. 120 tablet 5   No current facility-administered medications for this encounter.   Allergies  Allergen Reactions   Hydrocodone Itching   Social History   Socioeconomic History   Marital status: Single    Spouse name: none   Number of children: Not on file   Years of education: Not on file   Highest education level: Not on file  Occupational History   Occupation: Disabled  Tobacco Use   Smoking status: Never   Smokeless tobacco: Never  Vaping Use   Vaping Use: Never used  Substance and Sexual Activity   Alcohol use: Yes    Comment: 1 quarts a week   Drug use: Not Currently    Frequency: 3.0 times per week    Types: Marijuana, Cocaine   Sexual activity: Not Currently  Other Topics Concern   Not on file  Social History Narrative   Lives w/ mother in [place] but recently visiting w/ dtr in [place].   Social Determinants of Health   Financial Resource Strain: High Risk (02/20/2022)   Overall  Financial Resource Strain (CARDIA)    Difficulty of Paying Living Expenses: Hard  Food Insecurity: Food Insecurity Present (04/19/2022)   Hunger Vital Sign    Worried About Running Out of Food in the Last Year: Sometimes true    Ran Out of Food in the Last Year: Sometimes true  Transportation Needs: Unmet Transportation Needs (04/19/2022)   PRAPARE - Hydrologist (Medical): Yes    Lack of Transportation (Non-Medical): Yes  Physical Activity: Not on file  Stress: Not on file  Social Connections: Not on file  Intimate Partner Violence: Not on file   Family History  Problem Relation Age of Onset   Hypertension Maternal Grandmother    BP 94/74   Pulse 79   Wt 108.7 kg (239 lb 9.6 oz)   SpO2 98%   BMI 33.42 kg/m   Wt Readings from Last 3 Encounters:  05/03/22 108.7 kg (239 lb 9.6 oz)  05/02/22 108.9 kg (240 lb)  04/25/22 110.4 kg (243 lb 6.4 oz)   PHYSICAL EXAM: General:  NAD. No resp difficulty HEENT: Normal Neck: Supple. JVD 6-7. Carotids 2+ bilat; no bruits. No lymphadenopathy or thryomegaly appreciated. Cor: PMI nondisplaced. Regular rate & rhythm. No rubs, gallops or murmurs. Lungs: Clear Abdomen: Soft, nontender, nondistended. No hepatosplenomegaly. No bruits or masses. Good bowel sounds. Extremities: No cyanosis, clubbing, rash, 1-2+ BLE edema Neuro: Alert & oriented x 3, cranial nerves grossly intact. Moves all 4 extremities w/o difficulty. Affect pleasant.  ASSESSMENT & PLAN: 1. 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 He has a history of alcohol and cocaine abuse, which likely contributes to cardiomyopathy.  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 Most recent echo in 4/23 showed EF 10-15%, global HK, severe LV dilation, severely enlarged RV with severely decreased systolic function, severe biatrial enlargement, moderate MR, dilated IVC. LHC/RHC this admit showed low CI 1.53 and markedly high filling pressures,  RCA was chronically occluded but he did not have obstructive disease on left. Worse NYHA III, he is volume overloaded on exam, REDs 42%. He drinks a lot of water during the day. - Increase torsemide to 80 mg bid with extra 40 KCL x 3 days, then back to torsemide 40 mg bid. BMET/BNP today, repeat BMET in 10 days. - Place compression hose. - Continue digoxin 0.125 mg daily. Check level next visit as a trough.  - Continue Farxiga 10 mg daily. - Off BiDil with low BP - Not candidate for ICD unless he can cut back on ETOH abuse.  He has stopped cocaine. Narrow QRS so no CRT.  - Low output HF but with substance abuse, poor compliance, and living situation (basically homeless, lives with friends or in hotels), he is not a candidate for advanced therapies.  Would also be poor home milrinone candidate.   2. CAD: LHC in 7/20 and 5/23 showed CTO of RCA treated medically, left system ok. Suspect cardiomyopathy is primarily nonischemic. No chest pain. - Continue ASA 81 daily + atorvastatin 40 mg daily.  3. Cocaine Use Disorder: Has been off cocaine for several years.  4. Alcohol Use Disorder: Continue to abstain.  5. CKD stage 3: BMET today. 6. ? PAD: Continues to have discomfort in feet when walking. - Arterial dopplers with normal ABIs 7. SDOH: - He is homeless, staying with a friend.   - Continue paramedicine, appreciate their assistance.  Follow up in 3 weeks with APP to re-check fluid (check Dig trough and REDs), and 3 months with Dr. [name]. I have updated [name] with paramedicine of above med changes.  Prince Rome, FNP-BC 05/03/22

## 2022-05-03 NOTE — Progress Notes (Signed)
H&V Care Navigation CSW Progress Note  Clinical Social Worker met with patient to provide bus pass.  Patient is participating in a Managed Medicaid Plan:  [place] medicaid  Patient requested a bus pass to get home form the clinic. Jackie , LCSW, CCSW-MCS [phone]   SDOH Screenings   Alcohol Screen: Medium Risk (02/20/2022)   Alcohol Screen    Last Alcohol Screening Score (AUDIT): 24  Depression (PHQ2-9): Medium Risk (05/05/2019)   Depression (PHQ2-9)    PHQ-2 Score: 14  Financial Resource Strain: High Risk (02/20/2022)   Overall Financial Resource Strain (CARDIA)    Difficulty of Paying Living Expenses: Hard  Food Insecurity: Food Insecurity Present (04/19/2022)   Hunger Vital Sign    Worried About Running Out of Food in the Last Year: Sometimes true    Ran Out of Food in the Last Year: Sometimes true  Housing: High Risk (03/13/2022)   Housing    Last Housing Risk Score: 4  Physical Activity: Not on file  Social Connections: Not on file  Stress: Not on file  Tobacco Use: Low Risk  (05/03/2022)   Patient History    Smoking Tobacco Use: Never    Smokeless Tobacco Use: Never    Passive Exposure: Not on file  Transportation Needs: Unmet Transportation Needs (05/03/2022)   PRAPARE - Transportation    Lack of Transportation (Medical): Yes    Lack of Transportation (Non-Medical): Yes        

## 2022-05-09 ENCOUNTER — Other Ambulatory Visit (HOSPITAL_COMMUNITY): Payer: Self-pay | Admitting: *Deleted

## 2022-05-09 ENCOUNTER — Other Ambulatory Visit (HOSPITAL_COMMUNITY): Payer: Self-pay

## 2022-05-09 ENCOUNTER — Other Ambulatory Visit (HOSPITAL_COMMUNITY): Payer: Self-pay | Admitting: Emergency Medicine

## 2022-05-09 MED ORDER — METOLAZONE 2.5 MG PO TABS: ORAL_TABLET | ORAL | 0 refills | Status: DC

## 2022-05-09 MED ORDER — METOLAZONE 2.5 MG PO TABS
ORAL_TABLET | ORAL | 0 refills | Status: DC
  Filled 2022-05-09: qty 5, 5d supply, fill #0

## 2022-05-09 NOTE — Progress Notes (Signed)
Paramedicine Encounter    Patient ID: Dale [name], male    DOB: 1961/10/04, 61 y.o.   MRN: [id number]   There were no vitals taken for this visit. Weight yesterday-not taken Last visit weight-240lb Cbg 108  ATF [name] A&O x 4, skin warm and dry with good color.  Pt. Denies chest pain or SOB and lung sounds are clear and equal bilat.  He has an 8lb weight gain from last visit and significant edema to his lower extremities.  Pt missed Wed p.m. K and torsemide dose.  He has taken his morning meds but has not eaten yet.  He does say that he ate and drank a lot over the 4th of July holiday- water and alcohol. ("7 or 8 cans of beer" over the weekend)    He does complain of increased thirst for his normal and he is on a fluid restriction of 2 liters.  Contacted HF Clinic triage for med changes due to 8lb weight gain. Received orders for 2.5mg . Metolazone with extra 40 meq of Potassium and labs to be re-checked on Monday 7/10.  Pt's med box is set up for Dig trough on Monday lab appointment.  (Note:  I picked  up his prescription for Metolazone from [facility] and delivered to pt at 6:20 p.m. and he took same while I was there with the extra K) Home visit complete.  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) 06/13/2021   CHF (congestive heart failure) (HCC) 06/13/2021   Acute on chronic systolic CHF (congestive heart  failure), NYHA class 4 (HCC)    CKD (chronic kidney disease), stage III (HCC) 06/01/2021   Hypokalemia    Acute on chronic HFrEF (heart failure with reduced ejection fraction) (HCC) 12/25/2020   Coronary artery disease involving native coronary artery of native heart without angina pectoris    Acute kidney injury superimposed on chronic kidney disease (HCC) 10/16/2020   Acute on chronic combined systolic and diastolic HF (heart failure) (HCC) 09/08/2020   Acute on chronic combined systolic (congestive) and diastolic (congestive) heart failure (HCC)    Acute CHF (congestive heart failure) (HCC) 05/07/2019   Nausea & vomiting 05/07/2019   Acute exacerbation of CHF (congestive heart failure) (HCC) 03/11/2019   Troponin level elevated 03/11/2019   Hyperlipidemia 03/03/2019   NSVT (nonsustained ventricular tachycardia) (HCC) 03/03/2019   Anxiety and depression    NSTEMI (non-ST elevated myocardial infarction) (HCC) 02/28/2019   Acute systolic heart failure (HCC) 11/08/2018   Hypertensive urgency 09/28/2018   Acute pulmonary edema (HCC) 09/28/2018   Cocaine abuse (HCC) 09/28/2018   Substance abuse (HCC) 09/28/2018   Chronic kidney disease, stage II (mild) 09/28/2018   Normochromic anemia 09/28/2018   Acute systolic CHF (congestive heart failure) (HCC) 09/27/2018   Essential hypertension 03/11/2016   Obesity (BMI 30-39.9) 03/11/2016   Glucosuria 03/11/2016    Current Outpatient Medications:    aspirin 81 MG EC tablet, Take  1 tablet (81 mg total) by mouth daily. Swallow whole., Disp: 30 tablet, Rfl: 2   atorvastatin (LIPITOR) 40 MG tablet, Take 1 tablet (40 mg total) by mouth daily., Disp: 30 tablet, Rfl: 2   dapagliflozin propanediol (FARXIGA) 10 MG TABS tablet, Take 1 tablet (10 mg total) by mouth daily., Disp: 30 tablet, Rfl: 2   digoxin (LANOXIN) 0.125 MG tablet, Take 1 tablet (0.125 mg total) by mouth daily., Disp: 30 tablet, Rfl: 2   potassium chloride SA (KLOR-CON M) 20 MEQ tablet,  Take 1 tablet (20 mEq total) by mouth 2 (two) times daily., Disp: 60 tablet, Rfl: 6   torsemide (DEMADEX) 20 MG tablet, Take 2 tablets (40 mg total) by mouth 2 (two) times daily., Disp: 120 tablet, Rfl: 5 Allergies  Allergen Reactions   Hydrocodone Itching      Social History   Socioeconomic History   Marital status: Single    Spouse name: none   Number of children: Not on file   Years of education: Not on file   Highest education level: Not on file  Occupational History   Occupation: Disabled  Tobacco Use   Smoking status: Never   Smokeless tobacco: Never  Vaping Use   Vaping Use: Never used  Substance and Sexual Activity   Alcohol use: Yes    Comment: 1 quarts a week   Drug use: Not Currently    Frequency: 3.0 times per week    Types: Marijuana, Cocaine   Sexual activity: Not Currently  Other Topics Concern   Not on file  Social History Narrative   Lives w/ mother in [place] but recently visiting w/ dtr in [place].   Social Determinants of Health   Financial Resource Strain: High Risk (02/20/2022)   Overall Financial Resource Strain (CARDIA)    Difficulty of Paying Living Expenses: Hard  Food Insecurity: Food Insecurity Present (04/19/2022)   Hunger Vital Sign    Worried About Running Out of Food in the Last Year: Sometimes true    Ran Out of Food in the Last Year: Sometimes true  Transportation Needs: Unmet Transportation Needs (05/03/2022)   PRAPARE - Administrator, Civil Service (Medical): Yes    Lack of Transportation (Non-Medical): Yes  Physical Activity: Not on file  Stress: Not on file  Social Connections: Not on file  Intimate Partner Violence: Not on file    Physical Exam      Future Appointments  Date Time Provider Department Center  05/13/2022  8:30 AM [facility] LAB [facility] None  05/24/2022  8:50 AM Claiborne Rigg, NP [facility] None  05/31/2022  9:30 AM [facility] PA/NP [facility] None       [name],  EMT-Paramedic [phone] [facility] Paramedic  05/09/22

## 2022-05-13 ENCOUNTER — Other Ambulatory Visit (HOSPITAL_COMMUNITY): Payer: Medicaid Other

## 2022-05-14 ENCOUNTER — Ambulatory Visit (HOSPITAL_COMMUNITY)
Admission: RE | Admit: 2022-05-14 | Discharge: 2022-05-14 | Disposition: A | Discharge status: Home / Self Care | Payer: Medicaid Other | Source: Ambulatory Visit | Attending: Cardiology | Admitting: Cardiology

## 2022-05-14 DIAGNOSIS — I5040 Unspecified combined systolic (congestive) and diastolic (congestive) heart failure: Secondary | ICD-10-CM | POA: Insufficient documentation

## 2022-05-14 LAB — BASIC METABOLIC PANEL
Anion gap: 7 (ref 5–15)
BUN: 34 mg/dL — ABNORMAL HIGH (ref 6–20)
CO2: 31 mmol/L (ref 22–32)
Calcium: 8.8 mg/dL — ABNORMAL LOW (ref 8.9–10.3)
Chloride: 102 mmol/L (ref 98–111)
Creatinine, Ser: 1.54 mg/dL — ABNORMAL HIGH (ref 0.61–1.24)
GFR, Estimated: 52 mL/min — ABNORMAL LOW (ref >60)
Glucose, Bld: 107 mg/dL — ABNORMAL HIGH (ref 70–99)
Potassium: 3.3 mmol/L — ABNORMAL LOW (ref 3.5–5.1)
Sodium: 140 mmol/L (ref 135–145)

## 2022-05-16 ENCOUNTER — Telehealth (HOSPITAL_COMMUNITY): Payer: Self-pay | Admitting: *Deleted

## 2022-05-16 ENCOUNTER — Other Ambulatory Visit (HOSPITAL_COMMUNITY): Payer: Self-pay | Admitting: Emergency Medicine

## 2022-05-16 NOTE — Progress Notes (Signed)
Paramedicine Encounter    Patient ID: Dale [name], male    DOB: 1961-08-30, 61 y.o.   MRN: [id number]   BP 138/90 (BP Location: Left Arm, Patient Position: Sitting, Cuff Size: Normal)   Pulse 74   Resp 18   Wt 255 lb 12.8 oz (116 kg)   SpO2 98%   BMI 35.68 kg/m  Weight yesterday-did not weigh Last visit weight-248lb  ATF Dale [name] A&O x 4, skin warm and dry w/ good color.  He denies chest pain or SOB and lung sounds are clear and equal bilaterally.  He has JVD and significant edema to his lower extremities. His weight is up 7lbs from last visit.  Also advised him that his labs from 05/14/22 showed K is low and per Dr. [name] add potassium to his diet. No med change at this time.  Contacted HF Triage regarding increased weight and edema for possible med change.  Received message back from [facility] w/ med changes per [facility] pt to take 2.5mg  Metolazone and an extra of potassium.  Had already left pt's apartment but went back to assist him.  Pt had metolazone on hand and took 1 with an extra of K and added to his evening dose. Also moved all medications from [facility] to [facility].  Co-pays waived and medications delivered to pt.  Home visit complete.   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) 06/13/2021   CHF (congestive heart failure) (HCC) 06/13/2021   Acute on chronic systolic CHF  (congestive heart failure), NYHA class 4 (HCC)    CKD (chronic kidney disease), stage III (HCC) 06/01/2021   Hypokalemia    Acute on chronic HFrEF (heart failure with reduced ejection fraction) (HCC) 12/25/2020   Coronary artery disease involving native coronary artery of native heart without angina pectoris    Acute kidney injury superimposed on chronic kidney disease (HCC) 10/16/2020   Acute on chronic combined systolic and diastolic HF (heart failure) (HCC) 09/08/2020   Acute on chronic combined systolic (congestive) and diastolic (congestive) heart failure (HCC)    Acute CHF (congestive heart failure) (HCC) 05/07/2019   Nausea & vomiting 05/07/2019   Acute exacerbation of CHF (congestive heart failure) (HCC) 03/11/2019   Troponin level elevated 03/11/2019   Hyperlipidemia 03/03/2019   NSVT (nonsustained ventricular tachycardia) (HCC) 03/03/2019   Anxiety and depression    NSTEMI (non-ST elevated myocardial infarction) (HCC) 02/28/2019   Acute systolic heart failure (HCC) 11/08/2018   Hypertensive urgency 09/28/2018   Acute pulmonary edema (HCC) 09/28/2018   Cocaine abuse (HCC) 09/28/2018   Substance abuse (HCC) 09/28/2018   Chronic kidney disease, stage II (mild) 09/28/2018   Normochromic anemia 09/28/2018   Acute systolic CHF (congestive heart failure) (HCC) 09/27/2018   Essential hypertension 03/11/2016   Obesity (BMI 30-39.9) 03/11/2016   Glucosuria 03/11/2016    Current Outpatient Medications:    aspirin 81 MG EC tablet, Take 1  tablet (81 mg total) by mouth daily. Swallow whole., Disp: 30 tablet, Rfl: 2   atorvastatin (LIPITOR) 40 MG tablet, Take 1 tablet (40 mg total) by mouth daily., Disp: 30 tablet, Rfl: 2   dapagliflozin propanediol (FARXIGA) 10 MG TABS tablet, Take 1 tablet (10 mg total) by mouth daily., Disp: 30 tablet, Rfl: 2   digoxin (LANOXIN) 0.125 MG tablet, Take 1 tablet (0.125 mg total) by mouth daily., Disp: 30 tablet, Rfl: 2   potassium chloride SA (KLOR-CON  M) 20 MEQ tablet, Take 1 tablet (20 mEq total) by mouth 2 (two) times daily., Disp: 60 tablet, Rfl: 6   torsemide (DEMADEX) 20 MG tablet, Take 2 tablets (40 mg total) by mouth 2 (two) times daily., Disp: 120 tablet, Rfl: 5   metolazone (ZAROXOLYN) 2.5 MG tablet, Take only as directed by CHF clinic. (Patient not taking: Reported on 05/16/2022), Disp: 5 tablet, Rfl: 0 Allergies  Allergen Reactions   Hydrocodone Itching      Social History   Socioeconomic History   Marital status: Single    Spouse name: none   Number of children: Not on file   Years of education: Not on file   Highest education level: Not on file  Occupational History   Occupation: Disabled  Tobacco Use   Smoking status: Never   Smokeless tobacco: Never  Vaping Use   Vaping Use: Never used  Substance and Sexual Activity   Alcohol use: Yes    Comment: 1 quarts a week   Drug use: Not Currently    Frequency: 3.0 times per week    Types: Marijuana, Cocaine   Sexual activity: Not Currently  Other Topics Concern   Not on file  Social History Narrative   Lives w/ mother in [place] but recently visiting w/ dtr in [place].   Social Determinants of Health   Financial Resource Strain: High Risk (02/20/2022)   Overall Financial Resource Strain (CARDIA)    Difficulty of Paying Living Expenses: Hard  Food Insecurity: Food Insecurity Present (04/19/2022)   Hunger Vital Sign    Worried About Running Out of Food in the Last Year: Sometimes true    Ran Out of Food in the Last Year: Sometimes true  Transportation Needs: Unmet Transportation Needs (05/03/2022)   PRAPARE - Administrator, Civil Service (Medical): Yes    Lack of Transportation (Non-Medical): Yes  Physical Activity: Not on file  Stress: Not on file  Social Connections: Not on file  Intimate Partner Violence: Not on file    Physical Exam      Future Appointments  Date Time Provider Department Center  05/24/2022  8:50 AM Claiborne Rigg, NP  [facility] None  05/31/2022  9:30 AM [facility] PA/NP [facility] None       [name], EMT-Paramedic [phone] [facility] Paramedic  05/16/22

## 2022-05-16 NOTE — Telephone Encounter (Signed)
Error

## 2022-05-17 ENCOUNTER — Telehealth (HOSPITAL_COMMUNITY): Payer: Self-pay | Admitting: Emergency Medicine

## 2022-05-17 NOTE — Telephone Encounter (Signed)
[name] called me today at @ 14:52 to let me know that he was going to be moving from his [address] address. "It's just not working out here."  He advised that he does not know where he's going to be but he told me that he would let me know as soon as he figures something out.

## 2022-05-23 ENCOUNTER — Telehealth (HOSPITAL_COMMUNITY): Payer: Self-pay | Admitting: Emergency Medicine

## 2022-05-23 ENCOUNTER — Other Ambulatory Visit (HOSPITAL_COMMUNITY): Payer: Self-pay | Admitting: Emergency Medicine

## 2022-05-23 NOTE — Telephone Encounter (Signed)
Called pt @ 8:40 to request visit @ 9:15 and he agreed to same

## 2022-05-23 NOTE — Telephone Encounter (Signed)
Dale [name] called and asked me not to come this morning and advised he would call me back.

## 2022-05-23 NOTE — Progress Notes (Signed)
Pt. Called and cancelled prior to 9:15 visit

## 2022-05-24 ENCOUNTER — Ambulatory Visit: Payer: Medicaid Other | Admitting: Nurse Practitioner

## 2022-05-28 ENCOUNTER — Other Ambulatory Visit (HOSPITAL_COMMUNITY): Payer: Self-pay | Admitting: Emergency Medicine

## 2022-05-28 ENCOUNTER — Telehealth (HOSPITAL_COMMUNITY): Payer: Self-pay | Admitting: *Deleted

## 2022-05-28 ENCOUNTER — Other Ambulatory Visit (HOSPITAL_COMMUNITY): Payer: Self-pay

## 2022-05-28 MED ORDER — POTASSIUM CHLORIDE CRYS ER 20 MEQ PO TBCR: 40.0000 meq | EXTENDED_RELEASE_TABLET | Freq: Two times a day (BID) | ORAL | 6 refills | Status: DC

## 2022-05-28 MED ORDER — TORSEMIDE 20 MG PO TABS: 80.0000 mg | ORAL_TABLET | Freq: Two times a day (BID) | ORAL | 5 refills | Status: DC

## 2022-05-28 NOTE — Progress Notes (Signed)
Paramedicine Encounter    Patient ID: Dale [name], male    DOB: 03/10/1961, 61 y.o.   MRN: [id number]   There were no vitals taken for this visit. Weight yesterday-not taken Last visit weight-255lb  ATF Mr. Hudgins wearing a surgical mask.  He text me prior to my arrival and suggested I wear a mask also.  Several other people in the house are COVID positive.  Pt was A&O x 4, skin W&D w/ good color.  Pt  c/o of not feeling well-decreased appetite, nausea with some vomiting, no diarrhea.  Pt's weight is down 5lb from last visit. Ankles w/ 2+ pitting edema.  He c/o positional SOB specifically when lying flat. He says he hasn't slept well lately. Patient has been out of meds for 2 days. Called HF Clinic for possible med changes.  Med [facility] from [facility].  Pt to take 80mg  Torsemide BID and xtra 40 of Potassium daily till his appointment 7/28 @ 9:30.  I picked up his meds from [facility], delivered to pt for med box reconciliation.   Did a home COVID test for him during visit and it showed negative.  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) 06/13/2021   CHF (congestive heart failure) (HCC) 06/13/2021   Acute on chronic systolic CHF (congestive heart failure), NYHA class 4 (HCC)    CKD (chronic kidney disease), stage III (HCC) 06/01/2021   Hypokalemia    Acute on chronic HFrEF (heart failure with reduced  ejection fraction) (HCC) 12/25/2020   Coronary artery disease involving native coronary artery of native heart without angina pectoris    Acute kidney injury superimposed on chronic kidney disease (HCC) 10/16/2020   Acute on chronic combined systolic and diastolic HF (heart failure) (HCC) 09/08/2020   Acute on chronic combined systolic (congestive) and diastolic (congestive) heart failure (HCC)    Acute CHF (congestive heart failure) (HCC) 05/07/2019   Nausea & vomiting 05/07/2019   Acute exacerbation of CHF (congestive heart failure) (HCC) 03/11/2019   Troponin level elevated 03/11/2019   Hyperlipidemia 03/03/2019   NSVT (nonsustained ventricular tachycardia) (HCC) 03/03/2019   Anxiety and depression    NSTEMI (non-ST elevated myocardial infarction) (HCC) 02/28/2019   Acute systolic heart failure (HCC) 11/08/2018   Hypertensive urgency 09/28/2018   Acute pulmonary edema (HCC) 09/28/2018   Cocaine abuse (HCC) 09/28/2018   Substance abuse (HCC) 09/28/2018   Chronic kidney disease, stage II (mild) 09/28/2018   Normochromic anemia 09/28/2018   Acute systolic CHF (congestive heart failure) (HCC) 09/27/2018   Essential hypertension 03/11/2016   Obesity (BMI 30-39.9) 03/11/2016   Glucosuria 03/11/2016    Current Outpatient Medications:    aspirin 81 MG EC tablet, Take 1 tablet (81 mg total) by mouth daily. Swallow whole., Disp: 30 tablet, Rfl: 2   atorvastatin (LIPITOR) 40 MG tablet, Take 1 tablet (40 mg total) by mouth daily., Disp: 30 tablet, Rfl: 2   dapagliflozin  propanediol (FARXIGA) 10 MG TABS tablet, Take 1 tablet (10 mg total) by mouth daily., Disp: 30 tablet, Rfl: 2   digoxin (LANOXIN) 0.125 MG tablet, Take 1 tablet (0.125 mg total) by mouth daily., Disp: 30 tablet, Rfl: 2   metolazone (ZAROXOLYN) 2.5 MG tablet, Take only as directed by CHF clinic. (Patient not taking: Reported on 05/16/2022), Disp: 5 tablet, Rfl: 0   potassium chloride SA (KLOR-CON M) 20 MEQ tablet, Take 1 tablet  (20 mEq total) by mouth 2 (two) times daily., Disp: 60 tablet, Rfl: 6   torsemide (DEMADEX) 20 MG tablet, Take 2 tablets (40 mg total) by mouth 2 (two) times daily., Disp: 120 tablet, Rfl: 5 Allergies  Allergen Reactions   Hydrocodone Itching      Social History   Socioeconomic History   Marital status: Single    Spouse name: none   Number of children: Not on file   Years of education: Not on file   Highest education level: Not on file  Occupational History   Occupation: Disabled  Tobacco Use   Smoking status: Never   Smokeless tobacco: Never  Vaping Use   Vaping Use: Never used  Substance and Sexual Activity   Alcohol use: Yes    Comment: 1 quarts a week   Drug use: Not Currently    Frequency: 3.0 times per week    Types: Marijuana, Cocaine   Sexual activity: Not Currently  Other Topics Concern   Not on file  Social History Narrative   Lives w/ mother in [place] but recently visiting w/ dtr in [place].   Social Determinants of Health   Financial Resource Strain: High Risk (02/20/2022)   Overall Financial Resource Strain (CARDIA)    Difficulty of Paying Living Expenses: Hard  Food Insecurity: Food Insecurity Present (04/19/2022)   Hunger Vital Sign    Worried About Running Out of Food in the Last Year: Sometimes true    Ran Out of Food in the Last Year: Sometimes true  Transportation Needs: Unmet Transportation Needs (05/03/2022)   PRAPARE - Administrator, Civil Service (Medical): Yes    Lack of Transportation (Non-Medical): Yes  Physical Activity: Not on file  Stress: Not on file  Social Connections: Not on file  Intimate Partner Violence: Not on file    Physical Exam      Future Appointments  Date Time Provider Department Center  05/31/2022  9:30 AM [facility] PA/NP [facility] None  08/12/2022 10:30 AM Claiborne Rigg, NP [facility] None       [name], EMT-Paramedic [phone] [facility] Paramedic  05/28/22

## 2022-05-28 NOTE — Telephone Encounter (Signed)
[name] w/paramedicine is out seeing pt he is c/o sob and has 2+ pitting edema but his weight is down 5lbs bp 130/100. Pt presents with covid symptoms as well [name] will test for covid. Per Shanda Bumps [place],FNP increase to torsemide 80 bid + extra 40 KCL daily. Place compression hose. Will get labs at his follow up this week. If he is covid + will need to r/s office visit. [name] aware.

## 2022-05-30 ENCOUNTER — Telehealth (HOSPITAL_COMMUNITY): Payer: Self-pay

## 2022-05-30 ENCOUNTER — Encounter (HOSPITAL_COMMUNITY): Payer: Medicaid Other

## 2022-05-30 NOTE — Telephone Encounter (Signed)
Called and left patient a detailed message to confirm/remind patient of their appointment at the [facility] on 05/31/22.   In addition, a message was left reminding patient to bring all medications and/or complete list.

## 2022-05-31 ENCOUNTER — Other Ambulatory Visit (HOSPITAL_COMMUNITY): Payer: Self-pay | Admitting: Emergency Medicine

## 2022-05-31 ENCOUNTER — Other Ambulatory Visit (HOSPITAL_COMMUNITY): Payer: Self-pay | Admitting: *Deleted

## 2022-05-31 ENCOUNTER — Encounter (HOSPITAL_COMMUNITY): Payer: Self-pay

## 2022-05-31 ENCOUNTER — Ambulatory Visit (HOSPITAL_COMMUNITY)
Admission: RE | Admit: 2022-05-31 | Discharge: 2022-05-31 | Disposition: A | Discharge status: Home / Self Care | Payer: Medicaid Other | Source: Ambulatory Visit | Attending: Family Medicine | Admitting: Family Medicine

## 2022-05-31 VITALS — BP 90/66 | HR 89 | Wt 246.4 lb

## 2022-05-31 DIAGNOSIS — I739 Peripheral vascular disease, unspecified: Secondary | ICD-10-CM

## 2022-05-31 DIAGNOSIS — I5023 Acute on chronic systolic (congestive) heart failure: Secondary | ICD-10-CM | POA: Diagnosis not present

## 2022-05-31 DIAGNOSIS — I2510 Atherosclerotic heart disease of native coronary artery without angina pectoris: Secondary | ICD-10-CM | POA: Diagnosis not present

## 2022-05-31 DIAGNOSIS — F1290 Cannabis use, unspecified, uncomplicated: Secondary | ICD-10-CM | POA: Diagnosis not present

## 2022-05-31 DIAGNOSIS — I272 Pulmonary hypertension, unspecified: Secondary | ICD-10-CM | POA: Insufficient documentation

## 2022-05-31 DIAGNOSIS — F1021 Alcohol dependence, in remission: Secondary | ICD-10-CM

## 2022-05-31 DIAGNOSIS — M79671 Pain in right foot: Secondary | ICD-10-CM | POA: Insufficient documentation

## 2022-05-31 DIAGNOSIS — F1410 Cocaine abuse, uncomplicated: Secondary | ICD-10-CM | POA: Diagnosis not present

## 2022-05-31 DIAGNOSIS — F1010 Alcohol abuse, uncomplicated: Secondary | ICD-10-CM | POA: Insufficient documentation

## 2022-05-31 DIAGNOSIS — N183 Chronic kidney disease, stage 3 unspecified: Secondary | ICD-10-CM

## 2022-05-31 DIAGNOSIS — M79672 Pain in left foot: Secondary | ICD-10-CM | POA: Diagnosis not present

## 2022-05-31 DIAGNOSIS — I252 Old myocardial infarction: Secondary | ICD-10-CM | POA: Diagnosis not present

## 2022-05-31 DIAGNOSIS — Z5901 Sheltered homelessness: Secondary | ICD-10-CM | POA: Diagnosis not present

## 2022-05-31 DIAGNOSIS — Z7982 Long term (current) use of aspirin: Secondary | ICD-10-CM | POA: Diagnosis not present

## 2022-05-31 DIAGNOSIS — Z139 Encounter for screening, unspecified: Secondary | ICD-10-CM

## 2022-05-31 DIAGNOSIS — Z79899 Other long term (current) drug therapy: Secondary | ICD-10-CM | POA: Insufficient documentation

## 2022-05-31 DIAGNOSIS — I130 Hypertensive heart and chronic kidney disease with heart failure and stage 1 through stage 4 chronic kidney disease, or unspecified chronic kidney disease: Secondary | ICD-10-CM | POA: Insufficient documentation

## 2022-05-31 DIAGNOSIS — Z7984 Long term (current) use of oral hypoglycemic drugs: Secondary | ICD-10-CM | POA: Insufficient documentation

## 2022-05-31 LAB — BASIC METABOLIC PANEL
Anion gap: 8 (ref 5–15)
BUN: 32 mg/dL — ABNORMAL HIGH (ref 6–20)
CO2: 28 mmol/L (ref 22–32)
Calcium: 8.6 mg/dL — ABNORMAL LOW (ref 8.9–10.3)
Chloride: 105 mmol/L (ref 98–111)
Creatinine, Ser: 1.82 mg/dL — ABNORMAL HIGH (ref 0.61–1.24)
GFR, Estimated: 43 mL/min — ABNORMAL LOW (ref >60)
Glucose, Bld: 92 mg/dL (ref 70–99)
Potassium: 4.3 mmol/L (ref 3.5–5.1)
Sodium: 141 mmol/L (ref 135–145)

## 2022-05-31 LAB — BRAIN NATRIURETIC PEPTIDE: B Natriuretic Peptide: 1136.5 pg/mL — ABNORMAL HIGH (ref 0.0–100.0)

## 2022-05-31 MED ORDER — POTASSIUM CHLORIDE CRYS ER 20 MEQ PO TBCR: 40.0000 meq | EXTENDED_RELEASE_TABLET | Freq: Two times a day (BID) | ORAL | 6 refills | Status: DC

## 2022-05-31 MED ORDER — METOLAZONE 2.5 MG PO TABS: 2.5000 mg | ORAL_TABLET | ORAL | 0 refills | Status: DC

## 2022-05-31 NOTE — Progress Notes (Signed)
ADVANCED HF CLINIC NOTE  PCP: None HF Cardiologist: Dr. [name]  HPI: Dale [name] is a 61 y.o. male  with a PMH significant for hypertension, polysubstance use (including cocaine & alcohol), chronic combined CHF, and CAD.   He was admitted 11/19 with signs/symptoms of CHF, in the setting of poorly controlled hypertension alcohol and cocaine use. He was started on IV Lasix and echo showed EF 20%, G2 DD.  He left AMA.   Readmitted 12/19 with CHF exacerbation, started on IV diuretics, beta-blocker, ARB and BiDil.  Discharged on oral torsemide.  He remained positive for cocaine on UDS.   Admitted 4/20 with chest pain and CHF. Diagnosed with NSTEMI. He declined cardiac catheterization.     Admitted 5/20 with CHF and chest pain, still using cocaine, but no evidence of ischemia.  Diuresed and discharged.  Echo (5/20) EF 20 to 25%, elevated LVEDP, diffuse hypokinesis with worse hypokinesis and inferior wall moderately enlarged RV with normal function.  Admitted in 7/20 with acute CHF and underwent L/RHC showing severe single vessel CAD with CTO of pRCA, mild to moderate non-obs CAD involving LAD and LCx, moderately elevated left heart filling pressures, severely elevated right heart filling pressures, mild pulmonary hypertension and normal CO/CI by Fick. He was treated medically.  2 CHF admissions 2021. 6 admissions in 2022.    Admitted 7/22 with NSTEMI and troponin > 1000,  but treated medically due to continued cocaine use and concern for poor medication adherence if stent was needed.   Admitted 8/22 with CHF exacerbation.  Echo with EF <10%, medications restarted. Farxiga and spiro added.    He was seen in [facility] 9/22, doing well from CHF standpoint and compliant with medication regimen.   Admitted again in 4/23 with CHF, refused PICC line and refused RHC/LHC.  Concern for low output HF with AKI/creatinine up to 2.  Patient was started on milrinone empirically with good diuresis,  creatinine down to about 1.7. Echo this admission showed EF 10-15%, global HK, severe LV dilation, severely enlarged RV with severely decreased systolic function, severe biatrial enlargement, moderate MR, dilated IVC.   Follow up 5/23, out of all meds except Bidil x 5 days and was significantly volume overloaded. Arranged for outpatient RHC/LHC, and admitted with elevated filling pressures and low output, CI 1.53. RCA was chronically occluded but he did not have obstructive disease on left. Started on IV lasix and milrinone. Able to wean drips and titrate GDMT. Discharged to a friend's house, weight 248 lbs.  Today he returns for HF follow up with [name], paramedic. Overall feeling fair. More SOB walking on flat ground and legs are swelling. Drinking a lot of fluids during the day.  Drinks 2 40 oz beers a week. Denies palpitations, abnormal bleeding, CP, dizziness, . + Orthopnea.  Appetite ok. No fever or chills. Weight at home 246 pounds,, down 3 lbs after taking extra torsemide. Taking all medications. No further crack cocaine use, smokes THC daily. Staying with a friend, people in the house COVID+, but he has tested negative.  ECG (personally reviewed): none ordered today.  ReDs: 43%  Labs (9/22): K 3.9, creatinine 1.67 Labs (4/23): K 3.6, creatinine 1.69, hgb 12.8, LDL 87 Labs (5/23): K 3.7, creatinine 1.85 Labs (6/23): K 4.0, creatinine 1.45  Review of Systems:  All systems reviewed and negative except as per HPI.   PMH: 1. ETOH abuse.  2. Cocaine abuse: Has been off cocaine x months.  3. CAD: Cath in 7/20 with  totally occluded RCA with collaterals.  - NSTEMI in 7/22 with HS-TnI > 1000 but no cath.  4. CKD stage 3 5. HTN 6. Hyperlipidemia 7. Chronic systolic CHF: Suspect mixed ischemic/nonischemic cardiomyopathy.  See cath report as above from 7/20.  He continues to drink ETOH heavily, used cocaine in the past.  Both could contribute.  - Echo (4/23): EF 10-15%, global HK, severe LV  dilation, severely enlarged RV with severely decreased systolic function, severe biatrial enlargement, moderate MR, dilated IVC - R/LHC (5/23): occluded pRCA w/ collaterals, nonobstructive disease in the left system, markedly elevated filling pressures,  pulmonary venous hypertension, low CO. RA mean 23, RV 50/21, PA 53/32, mean 40, PCWP mean 39, LV 117/35, AO 117/82, CO/CI (Fick): 3.5/1.53, PVR < 1 WU, Oxygen saturations: PA 47%, AO 97%  ROS: All systems reviewed and negative except as per HPI.   Current Outpatient Medications  Medication Sig Dispense Refill   aspirin 81 MG EC tablet Take 1 tablet (81 mg total) by mouth daily. Swallow whole. 30 tablet 2   atorvastatin (LIPITOR) 40 MG tablet Take 1 tablet (40 mg total) by mouth daily. 30 tablet 2   dapagliflozin propanediol (FARXIGA) 10 MG TABS tablet Take 1 tablet (10 mg total) by mouth daily. 30 tablet 2   digoxin (LANOXIN) 0.125 MG tablet Take 1 tablet (0.125 mg total) by mouth daily. 30 tablet 2   potassium chloride SA (KLOR-CON M) 20 MEQ tablet Take 2 tablets (40 mEq total) by mouth 2 (two) times daily. 60 tablet 6   torsemide (DEMADEX) 20 MG tablet Take 4 tablets (80 mg total) by mouth 2 (two) times daily. 120 tablet 5   metolazone (ZAROXOLYN) 2.5 MG tablet Take only as directed by CHF clinic. (Patient not taking: Reported on 05/16/2022) 5 tablet 0   No current facility-administered medications for this encounter.   Allergies  Allergen Reactions   Hydrocodone Itching   Social History   Socioeconomic History   Marital status: Single    Spouse name: none   Number of children: Not on file   Years of education: Not on file   Highest education level: Not on file  Occupational History   Occupation: Disabled  Tobacco Use   Smoking status: Never   Smokeless tobacco: Never  Vaping Use   Vaping Use: Never used  Substance and Sexual Activity   Alcohol use: Yes    Comment: 1 quarts a week   Drug use: Not Currently    Frequency: 3.0  times per week    Types: Marijuana, Cocaine   Sexual activity: Not Currently  Other Topics Concern   Not on file  Social History Narrative   Lives w/ mother in [place] but recently visiting w/ dtr in [place].   Social Determinants of Health   Financial Resource Strain: High Risk (02/20/2022)   Overall Financial Resource Strain (CARDIA)    Difficulty of Paying Living Expenses: Hard  Food Insecurity: Food Insecurity Present (04/19/2022)   Hunger Vital Sign    Worried About Running Out of Food in the Last Year: Sometimes true    Ran Out of Food in the Last Year: Sometimes true  Transportation Needs: Unmet Transportation Needs (05/03/2022)   PRAPARE - Administrator, Civil Service (Medical): Yes    Lack of Transportation (Non-Medical): Yes  Physical Activity: Not on file  Stress: Not on file  Social Connections: Not on file  Intimate Partner Violence: Not on file   Family History  Problem Relation  Age of Onset   Hypertension Maternal Grandmother    BP 90/66   Pulse 89   Wt 111.8 kg (246 lb 6.4 oz)   SpO2 97%   BMI 34.37 kg/m   Wt Readings from Last 3 Encounters:  05/31/22 111.8 kg (246 lb 6.4 oz)  05/31/22 111.8 kg (246 lb 6.4 oz)  05/28/22 112.9 kg (249 lb)   PHYSICAL EXAM: General:  NAD. No resp difficulty HEENT: Normal Neck: Supple. JVP 7-8. Carotids 2+ bilat; no bruits. No lymphadenopathy or thryomegaly appreciated. Cor: PMI nondisplaced. Regular rate & rhythm. No rubs, gallops or murmurs. Lungs: Clear Abdomen: Soft, nontender, nondistended. No hepatosplenomegaly. No bruits or masses. Good bowel sounds. Extremities: No cyanosis, clubbing, rash, 2+ BLE edema to knees Neuro: Alert & oriented x 3, cranial nerves grossly intact. Moves all 4 extremities w/o difficulty. Affect pleasant.  ASSESSMENT & PLAN: 1. 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 He has a history of alcohol and cocaine abuse, which likely contributes to  cardiomyopathy.  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 Most recent echo in 4/23 showed EF 10-15%, global HK, severe LV dilation, severely enlarged RV with severely decreased systolic function, severe biatrial enlargement, moderate MR, dilated IVC. LHC/RHC this admit showed low CI 1.53 and markedly high filling pressures, RCA was chronically occluded but he did not have obstructive disease on left. NYHA III, he is volume overloaded on exam, REDs 43%. He drinks a lot of water during the day. - Add metolazone 2.5 mg + extra 40 KCL every Friday, take dose today and a dose on Sunday. BMET/BNP today, repeat BMET in 7-10 days. - Continue torsemide 80 mg bid.  - Place compression hose. Given another Rx for these today. - Continue digoxin 0.125 mg daily. Check level next lab visit. - Continue Farxiga 10 mg daily. - Off BiDil with low BP. - Not candidate for ICD unless he can cut back on ETOH abuse.  He has stopped cocaine. Narrow QRS so no CRT.  - Low output HF but with substance abuse, poor compliance, and living situation (basically homeless, lives with friends or in hotels), he is not a candidate for advanced therapies.  Would also be poor home milrinone candidate.   2. CAD: LHC in 7/20 and 5/23 showed CTO of RCA treated medically, left system ok. Suspect cardiomyopathy is primarily nonischemic. No chest pain. - Continue ASA 81 daily + atorvastatin 40 mg daily.  3. Cocaine Use Disorder: Has been off cocaine for several years.  4. Alcohol Use Disorder: Advised complete cessation. 5. CKD stage 3: BMET today. 6. ? PAD: Continues to have discomfort in feet when walking. - Arterial dopplers with normal ABIs. 7. SDOH: uses public transportation. - He is homeless, staying with a friend.   - Continue paramedicine, appreciate their assistance.  Follow up in 2-3 months with Dr. Aundra Dubin.  Allena Katz, FNP-BC 05/31/22

## 2022-05-31 NOTE — Progress Notes (Signed)
Paramedicine Encountered   Patient ID: Dale [name] , male,   DOB: [birth year],60 y.o.,  MRN: [id number]   Met patient in clinic today with provider Time spent with patient 1 hour Pt  reports today no chest pain or SOB.  His weight is down 3lbs  from his last home visit.  The edema in his lower extremites is still present and significant but improved from his last home visit.  He has not yet gotten his prescription for his compression socks filled yet.  REDS clip read 43% today.  Meds changed to 80 Torsemide BID, 2.34m. Metolazone on Fridays w/ extra 411m of Potassium.  Pt admits still drinking alcohol.  He says he drinks 2 40oz beers a weak.  He denies cocaine use but does smoke marijuana daily.  We discussed being more compliant with his meds. He did not bring his meds with him to this visit so I will go by his house later today to make the prescribed changes.  Visit complete.    DeRenee RamusEMLatimer/28/2023

## 2022-05-31 NOTE — Progress Notes (Signed)
ReDS Vest / Clip - 05/31/22 0900       ReDS Vest / Clip   Station Marker C    Ruler Value 28.5    ReDS Value Range High volume overload    ReDS Actual Value 43

## 2022-05-31 NOTE — Progress Notes (Signed)
I picked up his Metolazone and Potassium prescription from [facility] and delivered same to pt.    Updated pill box w/ med changes 2.5mg . Metolazone on Friday's w/ additional potassium  Visit complete.

## 2022-05-31 NOTE — Patient Instructions (Signed)
Labs done today. We will contact you only if your labs are abnormal.  START Metolazone 2.5mg  (1 tablet) by mouth every Friday starting today.  Take an extra 67meq(2 tablets) of Potassium when taking Metolazone.   No other medication changes were made. Please continue all current medications as prescribed.  Please wear your compression hose daily, place them on as soon as you get up in the morning and remove before you go to bed at night. You can purchase these at [facility]. Their [address], [place], [place] [address] [phone]  Your physician recommends that you schedule a follow-up appointment in: 7-10 days only for a lab only appointment(HOLD YOUR DIGOXIN FOR THIS LAB APPOINTMENT) and in 2-3 months for an appointment with Dr. [name]. Please contact our office in September to schedule a October appointment.   If you have any questions or concerns before your next appointment please send [place] a message through [place] or call our office at [phone].    TO LEAVE A MESSAGE FOR THE NURSE SELECT OPTION 2, PLEASE LEAVE A MESSAGE INCLUDING: YOUR NAME DATE OF BIRTH CALL BACK NUMBER REASON FOR CALL**this is important as we prioritize the call backs  YOU WILL RECEIVE A CALL BACK THE SAME DAY AS LONG AS YOU CALL BEFORE 4:00 PM   Do the following things EVERYDAY: Weigh yourself in the morning before breakfast. Write it down and keep it in a log. Take your medicines as prescribed Eat low salt foods--Limit salt (sodium) to 2000 mg per day.  Stay as active as you can everyday Limit all fluids for the day to less than 2 liters   At the [facility], you and your health needs are our priority. As part of our continuing mission to provide you with exceptional heart care, we have created designated Provider Care Teams. These Care Teams include your primary Cardiologist (physician) and Advanced Practice Providers (APPs- Physician Assistants and Nurse  Practitioners) who all work together to provide you with the care you need, when you need it.   You may see any of the following providers on your designated Care Team at your next follow up: Dr Arvilla Meres Dr Carron Curie, NP Robbie Lis, [place] Karle Plumber, PharmD   Please be sure to bring in all your medications bottles to every appointment.

## 2022-06-04 ENCOUNTER — Other Ambulatory Visit (HOSPITAL_COMMUNITY): Payer: Self-pay | Admitting: Emergency Medicine

## 2022-06-04 NOTE — Progress Notes (Signed)
Paramedicine Encounter    Patient ID: Dale [name], male    DOB: [birth year], 61 y.o.   MRN: [id number]   There were no vitals taken for this visit. Weight yesterday-not taken Last visit weight-246.6   ATF Dale [name] A&O x 4, skin W&D w/ good color.  He reports to be feeling well.  Pt. Is down 8lbs.  He took 2.5mg  Metolazone last Friday w/ xtra 40 meq potassium. Pt still has pitting edema to his lower extremities.  He still has not gone to get his compression socks which he says he will do tomorrow.  Also continued to suggest he elevate his legs when he can.  He denies chest pain or SOB. He has taken his meds appropriately.  I reconciled his meds to include Metolazone on Friday w/ 40 xtra potassium again this week.  He will repeat labs 8/8 and is aware he is not to take his Digoxin on that day.  Home visit complete.  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) 06/13/2021   CHF (congestive heart failure) (HCC) 06/13/2021   Acute on chronic systolic CHF (congestive heart failure), NYHA class 4 (HCC)    CKD (chronic kidney disease), stage III (HCC) 06/01/2021   Hypokalemia    Acute on chronic HFrEF (heart failure with reduced ejection fraction) (HCC) 12/25/2020   Coronary artery disease involving native coronary artery of native heart without angina pectoris    Acute kidney injury superimposed on chronic kidney disease (HCC)  10/16/2020   Acute on chronic combined systolic and diastolic HF (heart failure) (HCC) 09/08/2020   Acute on chronic combined systolic (congestive) and diastolic (congestive) heart failure (HCC)    Acute CHF (congestive heart failure) (HCC) 05/07/2019   Nausea & vomiting 05/07/2019   Acute exacerbation of CHF (congestive heart failure) (HCC) 03/11/2019   Troponin level elevated 03/11/2019   Hyperlipidemia 03/03/2019   NSVT (nonsustained ventricular tachycardia) (HCC) 03/03/2019   Anxiety and depression    NSTEMI (non-ST elevated myocardial infarction) (HCC) 02/28/2019   Acute systolic heart failure (HCC) 11/08/2018   Hypertensive urgency 09/28/2018   Acute pulmonary edema (HCC) 09/28/2018   Cocaine abuse (HCC) 09/28/2018   Substance abuse (HCC) 09/28/2018   Chronic kidney disease, stage II (mild) 09/28/2018   Normochromic anemia 09/28/2018   Acute systolic CHF (congestive heart failure) (HCC) 09/27/2018   Essential hypertension 03/11/2016   Obesity (BMI 30-39.9) 03/11/2016   Glucosuria 03/11/2016    Current Outpatient Medications:    aspirin 81 MG EC tablet, Take 1 tablet (81 mg total) by mouth daily. Swallow whole., Disp: 30 tablet, Rfl: 2   atorvastatin (LIPITOR) 40 MG tablet, Take 1 tablet (40 mg total) by mouth daily., Disp: 30 tablet, Rfl: 2   dapagliflozin propanediol (FARXIGA) 10 MG TABS tablet, Take 1 tablet (10 mg total) by mouth daily., Disp: 30 tablet, Rfl: 2   digoxin (LANOXIN) 0.125 MG tablet, Take 1 tablet (0.125 mg total) by mouth daily.,  Disp: 30 tablet, Rfl: 2   metolazone (ZAROXOLYN) 2.5 MG tablet, Take 1 tablet (2.5 mg total) by mouth every Friday. Take an additional 2 tablets of potassium, Disp: 4 tablet, Rfl: 0   potassium chloride SA (KLOR-CON M) 20 MEQ tablet, Take 2 tablets (40 mEq total) by mouth 2 (two) times daily. Take an additional with metolazone, Disp: 75 tablet, Rfl: 6   torsemide (DEMADEX) 20 MG tablet, Take 4 tablets (80 mg total) by mouth 2  (two) times daily., Disp: 120 tablet, Rfl: 5 Allergies  Allergen Reactions   Hydrocodone Itching      Social History   Socioeconomic History   Marital status: Single    Spouse name: none   Number of children: Not on file   Years of education: Not on file   Highest education level: Not on file  Occupational History   Occupation: Disabled  Tobacco Use   Smoking status: Never   Smokeless tobacco: Never  Vaping Use   Vaping Use: Never used  Substance and Sexual Activity   Alcohol use: Yes    Comment: 1 quarts a week   Drug use: Not Currently    Frequency: 3.0 times per week    Types: Marijuana, Cocaine   Sexual activity: Not Currently  Other Topics Concern   Not on file  Social History Narrative   Lives w/ mother in [place] but recently visiting w/ dtr in [place].   Social Determinants of Health   Financial Resource Strain: High Risk (02/20/2022)   Overall Financial Resource Strain (CARDIA)    Difficulty of Paying Living Expenses: Hard  Food Insecurity: Food Insecurity Present (04/19/2022)   Hunger Vital Sign    Worried About Running Out of Food in the Last Year: Sometimes true    Ran Out of Food in the Last Year: Sometimes true  Transportation Needs: Unmet Transportation Needs (05/03/2022)   PRAPARE - Administrator, Civil Service (Medical): Yes    Lack of Transportation (Non-Medical): Yes  Physical Activity: Not on file  Stress: Not on file  Social Connections: Not on file  Intimate Partner Violence: Not on file    Physical Exam      Future Appointments  Date Time Provider Department Center  06/11/2022  9:00 AM [facility] LAB [facility] None  08/12/2022 10:30 AM Claiborne Rigg, NP [facility] None       [name], EMT-Paramedic [phone] [facility] Paramedic  06/04/22

## 2022-06-11 ENCOUNTER — Other Ambulatory Visit (HOSPITAL_COMMUNITY): Payer: Medicaid Other

## 2022-06-12 ENCOUNTER — Ambulatory Visit (HOSPITAL_COMMUNITY)
Admission: RE | Admit: 2022-06-12 | Discharge: 2022-06-12 | Disposition: A | Discharge status: Home / Self Care | Payer: Medicaid Other | Source: Ambulatory Visit | Attending: Cardiology | Admitting: Cardiology

## 2022-06-12 ENCOUNTER — Other Ambulatory Visit (HOSPITAL_COMMUNITY): Payer: Self-pay | Admitting: Emergency Medicine

## 2022-06-12 DIAGNOSIS — I5023 Acute on chronic systolic (congestive) heart failure: Secondary | ICD-10-CM | POA: Diagnosis present

## 2022-06-12 LAB — BASIC METABOLIC PANEL
Anion gap: 10 (ref 5–15)
BUN: 46 mg/dL — ABNORMAL HIGH (ref 6–20)
CO2: 34 mmol/L — ABNORMAL HIGH (ref 22–32)
Calcium: 9.2 mg/dL (ref 8.9–10.3)
Chloride: 92 mmol/L — ABNORMAL LOW (ref 98–111)
Creatinine, Ser: 2.02 mg/dL — ABNORMAL HIGH (ref 0.61–1.24)
GFR, Estimated: 38 mL/min — ABNORMAL LOW (ref >60)
Glucose, Bld: 115 mg/dL — ABNORMAL HIGH (ref 70–99)
Potassium: 3.3 mmol/L — ABNORMAL LOW (ref 3.5–5.1)
Sodium: 136 mmol/L (ref 135–145)

## 2022-06-12 LAB — DIGOXIN LEVEL: Digoxin Level: 0.5 ng/mL — ABNORMAL LOW (ref 0.8–2.0)

## 2022-06-12 NOTE — Progress Notes (Signed)
Paramedicine Encounter    Patient ID: Dale [name], male    DOB: September 21, 1961, 61 y.o.   MRN: [id number]   There were no vitals taken for this visit. Weight yesterday-not taken Last visit weight-238lb  [place] [facility].  I was initially to meet him at [place] but he stated that he's not staying there right now.  Pt had his medications and pill box with him.  He has been compliant with his meds.  Med box reconciled for two weeks.  He was running short on his Torsemide and I was able to go by [employer] and pick up prescription for same.  Pt denies chest pain or SOB.  No edema to his lower extremities.  Lung sounds clear and equal bilat.  I asked him where he was going to stay tonight and he advised he was [place] work something out although he wasn't specific.  He said, I can take care of myself.  He mentioned staying with is daughter.  His mother also called him while I was with him and he told her he would call her back.  Visit complete.  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 ([place]) 06/13/2021   CHF (congestive heart failure) ([place]) 06/13/2021   Acute on chronic systolic CHF (congestive heart failure), NYHA class 4 ([place])    CKD (chronic kidney disease), stage III ([place]) 06/01/2021   Hypokalemia    Acute on chronic HFrEF (heart failure with reduced ejection fraction) ([place])  12/25/2020   Coronary artery disease involving native coronary artery of native heart without angina pectoris    Acute kidney injury superimposed on chronic kidney disease ([place]) 10/16/2020   Acute on chronic combined systolic and diastolic HF (heart failure) ([place]) 09/08/2020   Acute on chronic combined systolic (congestive) and diastolic (congestive) heart failure ([place])    Acute CHF (congestive heart failure) ([place]) 05/07/2019   Nausea & vomiting 05/07/2019   Acute exacerbation of CHF (congestive heart failure) ([place]) 03/11/2019   Troponin level elevated 03/11/2019   Hyperlipidemia 03/03/2019   NSVT (nonsustained ventricular tachycardia) ([place]) 03/03/2019   Anxiety and depression    NSTEMI (non-ST elevated myocardial infarction) ([place]) 80/16/5537   Acute systolic heart failure ([place]) 11/08/2018   Hypertensive urgency 09/28/2018   Acute pulmonary edema ([place]) 09/28/2018   Cocaine abuse ([place]) 09/28/2018   Substance abuse ([place]) 09/28/2018   Chronic kidney disease, stage II (mild) 09/28/2018   Normochromic anemia 48/27/0786   Acute systolic CHF (congestive heart failure) ([place]) 09/27/2018   Essential hypertension 03/11/2016   Obesity (BMI 30-39.9) 03/11/2016   Glucosuria 03/11/2016    Current Outpatient Medications:    aspirin 81 MG EC tablet, Take 1 tablet (81 mg total) by mouth daily. Swallow whole., Disp: 30 tablet, Rfl: 2   atorvastatin (LIPITOR) 40 MG tablet, Take 1 tablet (40 mg total) by mouth daily., Disp: 30 tablet, Rfl: 2  dapagliflozin propanediol (FARXIGA) 10 MG TABS tablet, Take 1 tablet (10 mg total) by mouth daily., Disp: 30 tablet, Rfl: 2   digoxin (LANOXIN) 0.125 MG tablet, Take 1 tablet (0.125 mg total) by mouth daily., Disp: 30 tablet, Rfl: 2   metolazone (ZAROXOLYN) 2.5 MG tablet, Take 1 tablet (2.5 mg total) by mouth every Friday. Take an additional 2 tablets of potassium, Disp: 4 tablet, Rfl: 0   potassium chloride SA (KLOR-CON M) 20 MEQ tablet, Take 2 tablets (40 mEq  total) by mouth 2 (two) times daily. Take an additional 71mq with metolazone, Disp: 75 tablet, Rfl: 6   torsemide (DEMADEX) 20 MG tablet, Take 4 tablets (80 mg total) by mouth 2 (two) times daily., Disp: 120 tablet, Rfl: 5 Allergies  Allergen Reactions   Hydrocodone Itching      Social History   Socioeconomic History   Marital status: Single    Spouse name: none   Number of children: Not on file   Years of education: Not on file   Highest education level: Not on file  Occupational History   Occupation: Disabled  Tobacco Use   Smoking status: Never   Smokeless tobacco: Never  Vaping Use   Vaping Use: Never used  Substance and Sexual Activity   Alcohol use: Yes    Comment: 1 quarts a week   Drug use: Not Currently    Frequency: 3.0 times per week    Types: Marijuana, Cocaine   Sexual activity: Not Currently  Other Topics Concern   Not on file  Social History Narrative   [place]/ [place]   Social Determinants of Health   Financial Resource Strain: High Risk (02/20/2022)   Overall Financial Resource Strain (CARDIA)    Difficulty of Paying Living Expenses: Hard  Food Insecurity: Food Insecurity Present (04/19/2022)   Hunger Vital Sign    Worried About Running Out of Food in the Last Year: Sometimes true    Ran Out of Food in the Last Year: Sometimes true  Transportation Needs: Unmet Transportation Needs (05/03/2022)   PRAPARE - THydrologist(Medical): Yes    Lack of Transportation (Non-Medical): Yes  Physical Activity: Not on file  Stress: Not on file  Social Connections: Not on file  Intimate Partner Violence: Not on file    Physical Exam      Future Appointments  Date Time Provider [place] 08/12/2022 10:30 AM FGildardo Pounds NP [facility] Beach[facility]amedic  06/12/22

## 2022-06-13 ENCOUNTER — Telehealth (HOSPITAL_COMMUNITY): Payer: Self-pay

## 2022-06-13 DIAGNOSIS — I5040 Unspecified combined systolic (congestive) and diastolic (congestive) heart failure: Secondary | ICD-10-CM

## 2022-06-13 NOTE — Telephone Encounter (Signed)
-----   Message from Jacklynn Ganong, [place] sent at 06/12/2022  4:59 PM EDT ----- K is low after recent diuresis. Take extra 40 KCL x 2 days.   Repeat BMET in 2 weeks

## 2022-06-13 NOTE — Telephone Encounter (Signed)
Placed patient's lab order and appointment scheduled, However patient stated needing transportation to get home and a message has been sent to JB. In addition, Pt aware, agreeable, and verbalized understanding

## 2022-06-26 ENCOUNTER — Other Ambulatory Visit (HOSPITAL_COMMUNITY): Payer: Self-pay | Admitting: Cardiology

## 2022-06-26 ENCOUNTER — Other Ambulatory Visit (HOSPITAL_COMMUNITY): Payer: Self-pay | Admitting: Emergency Medicine

## 2022-06-26 NOTE — Progress Notes (Addendum)
Paramedicine Encounter    Patient ID: Dale [name], male    DOB: 1961/05/26, 61 y.o.   MRN: [id number]   BP 110/80 (BP Location: Right Arm, Patient Position: Sitting, Cuff Size: Normal)   Pulse 78   Resp 16   Wt 234 lb 3.2 oz (106.2 kg)   SpO2 98%   BMI 32.66 kg/m  Weight yesterday-not taken Last visit weight-238lb  Met with [name] for home visit today.  He admits to doing poorly with his med compliance.  He had a brief period in the past two weeks of [place]  He had voiced being unhappy with his living situation.  I did discuss resources with him such as shelters but he said, "I'll manage, I'll be ok."   So today, I reconciled his med box.  He has a lab appointment in the morning and I reminded him to not take his Digoxin until after he has had his blood drawn and he says he understands same.  He denies chest pain or SOB.  Lung sounds clear and equal bilat.  No edema noted to his left leg.  He does have some swelling and tenderness to his rt ankle which he says started about 2 days ago.  The swelling is isolated to the ankle area.  He denies any recent falls or trauma to that foot.  The area is very tender to touch and he rates the pain 8/10 on pain scale.  He called me after my visit to ask if I could get him crutches and I advised him that he would need to be evaluated by a physician and then they could possibly order crutches for him.  Refills called into [facility] = Potassium Cl, Farxiga, Digoxin, Atorvastatin.   Home visit complete.    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 ([place]) 06/13/2021   CHF (congestive heart failure) ([place]) 06/13/2021   Acute on chronic systolic CHF (congestive heart failure), NYHA class 4 (HCC)    CKD (chronic kidney disease), stage III ([place]) 06/01/2021   Hypokalemia    Acute on chronic HFrEF (heart failure with reduced ejection fraction) ([place]) 12/25/2020   Coronary artery disease involving native coronary artery of native heart without angina pectoris    Acute kidney injury superimposed on chronic kidney disease ([place]) 10/16/2020   Acute on chronic combined systolic and diastolic HF (heart failure) ([place]) 09/08/2020   Acute on chronic combined systolic (congestive) and diastolic (congestive) heart failure ([place])    Acute CHF (congestive heart failure) ([place]) 05/07/2019   Nausea & vomiting 05/07/2019   Acute exacerbation of CHF (congestive heart failure) ([place]) 03/11/2019   Troponin level elevated 03/11/2019   Hyperlipidemia 03/03/2019   NSVT (nonsustained ventricular tachycardia) ([place]) 03/03/2019   Anxiety and depression    NSTEMI (non-ST elevated myocardial infarction) ([place]) 12/04/4386   Acute systolic heart failure (  [place]) 11/08/2018   Hypertensive urgency 09/28/2018   Acute pulmonary edema ([place]) 09/28/2018   Cocaine abuse ([place]) 09/28/2018   Substance abuse ([place]) 09/28/2018   Chronic kidney disease, stage II (mild) 09/28/2018   Normochromic anemia 14/97/0263   Acute systolic CHF (congestive heart failure) ([place]) 09/27/2018   Essential hypertension 03/11/2016   Obesity (BMI 30-39.9) 03/11/2016   Glucosuria 03/11/2016    Current Outpatient Medications:    aspirin 81 MG EC tablet, Take 1 tablet (81 mg total) by mouth daily. Swallow whole., Disp: 30 tablet, Rfl: 2    atorvastatin (LIPITOR) 40 MG tablet, Take 1 tablet (40 mg total) by mouth daily., Disp: 30 tablet, Rfl: 2   dapagliflozin propanediol (FARXIGA) 10 MG TABS tablet, Take 1 tablet (10 mg total) by mouth daily., Disp: 30 tablet, Rfl: 2   digoxin (LANOXIN) 0.125 MG tablet, Take 1 tablet (0.125 mg total) by mouth daily., Disp: 30 tablet, Rfl: 2   metolazone (ZAROXOLYN) 2.5 MG tablet, Take 1 tablet (2.5 mg total) by mouth every Friday. Take an additional 2 tablets of potassium, Disp: 4 tablet, Rfl: 0   potassium chloride SA (KLOR-CON M) 20 MEQ tablet, Take 2 tablets (40 mEq total) by mouth 2 (two) times daily. Take an additional 33mq with metolazone, Disp: 75 tablet, Rfl: 6   torsemide (DEMADEX) 20 MG tablet, Take 4 tablets (80 mg total) by mouth 2 (two) times daily., Disp: 120 tablet, Rfl: 5 Allergies  Allergen Reactions   Hydrocodone Itching      Social History   Socioeconomic History   Marital status: Single    Spouse name: none   Number of children: Not on file   Years of education: Not on file   Highest education level: Not on file  Occupational History   Occupation: Disabled  Tobacco Use   Smoking status: Never   Smokeless tobacco: Never  Vaping Use   Vaping Use: Never used  Substance and Sexual Activity   Alcohol use: Yes    Comment: 1 quarts a week   Drug use: Not Currently    Frequency: 3.0 times per week    Types: Marijuana, Cocaine   Sexual activity: Not Currently  Other Topics Concern   Not on file  Social History Narrative   [place]   Social Determinants of Health   Financial Resource Strain: High Risk (02/20/2022)   Overall Financial Resource Strain (CARDIA)    Difficulty of Paying Living Expenses: Hard  Food Insecurity: Food Insecurity Present (04/19/2022)   Hunger Vital Sign    Worried About Running Out of Food in the Last Year: Sometimes true    Ran Out of Food in the Last Year: Sometimes true   Transportation Needs: Unmet Transportation Needs (05/03/2022)   PRAPARE - THydrologist(Medical): Yes    Lack of Transportation (Non-Medical): Yes  Physical Activity: Not on file  Stress: Not on file  Social Connections: Not on file  Intimate Partner Violence: Not on file    Physical Exam      Future Appointments  Date Time Provider DMcAl[place] 06/27/2022  9:00 AM [facility] LAB [facility] None  08/12/2022 10:30 AM FGildardo Pounds NP [place] ED[facility]Paramedic  06/26/22

## 2022-06-27 ENCOUNTER — Other Ambulatory Visit (HOSPITAL_COMMUNITY): Payer: Self-pay | Admitting: Emergency Medicine

## 2022-06-27 ENCOUNTER — Other Ambulatory Visit (HOSPITAL_COMMUNITY): Payer: Medicaid Other

## 2022-06-27 NOTE — Progress Notes (Signed)
Picked up the following meds for pt from [facility]  Digoxin .125mg . Potassium Cl Farxiga Atorvastatin.  I will deliver to pt next week during home visit and med rec.

## 2022-06-28 ENCOUNTER — Ambulatory Visit (HOSPITAL_COMMUNITY)
Admission: RE | Admit: 2022-06-28 | Discharge: 2022-06-28 | Disposition: A | Discharge status: Home / Self Care | Payer: Medicaid Other | Source: Ambulatory Visit | Attending: Cardiology | Admitting: Cardiology

## 2022-06-28 ENCOUNTER — Telehealth (HOSPITAL_COMMUNITY): Payer: Self-pay | Admitting: Emergency Medicine

## 2022-06-28 ENCOUNTER — Telehealth (HOSPITAL_COMMUNITY): Payer: Self-pay

## 2022-06-28 DIAGNOSIS — E876 Hypokalemia: Secondary | ICD-10-CM

## 2022-06-28 DIAGNOSIS — Z01818 Encounter for other preprocedural examination: Secondary | ICD-10-CM | POA: Diagnosis present

## 2022-06-28 DIAGNOSIS — I5040 Unspecified combined systolic (congestive) and diastolic (congestive) heart failure: Secondary | ICD-10-CM | POA: Diagnosis not present

## 2022-06-28 LAB — BASIC METABOLIC PANEL
Anion gap: 10 (ref 5–15)
BUN: 34 mg/dL — ABNORMAL HIGH (ref 6–20)
CO2: 32 mmol/L (ref 22–32)
Calcium: 8.7 mg/dL — ABNORMAL LOW (ref 8.9–10.3)
Chloride: 95 mmol/L — ABNORMAL LOW (ref 98–111)
Creatinine, Ser: 1.87 mg/dL — ABNORMAL HIGH (ref 0.61–1.24)
GFR, Estimated: 41 mL/min — ABNORMAL LOW (ref >60)
Glucose, Bld: 91 mg/dL (ref 70–99)
Potassium: 3.3 mmol/L — ABNORMAL LOW (ref 3.5–5.1)
Sodium: 137 mmol/L (ref 135–145)

## 2022-06-28 MED ORDER — POTASSIUM CHLORIDE CRYS ER 20 MEQ PO TBCR: EXTENDED_RELEASE_TABLET | ORAL | 11 refills | Status: DC

## 2022-06-28 NOTE — Telephone Encounter (Signed)
Patient's lab order and appointment has been placed and med list updated.  Pt aware, agreeable, and verbalized understanding

## 2022-06-28 NOTE — Telephone Encounter (Signed)
Left voicemail with Mr. Lehigh to review med change due to low K.  He is to increase his Pot Cl to  twice a day per [facility].  Asked him to call me back and leave a message to confirm that he got my message.    [name], EMT-Paramedic [phone] 06/28/2022

## 2022-06-28 NOTE — Telephone Encounter (Signed)
-----   Message from Jacklynn Ganong, [place] sent at 06/28/2022 11:11 AM EDT ----- K remains low. Please make he is taking 40 KCL bid? If so, increase KCL to 60 bid and repeat BMET in 1-2 weeks.

## 2022-07-02 ENCOUNTER — Other Ambulatory Visit (HOSPITAL_COMMUNITY): Payer: Self-pay | Admitting: Emergency Medicine

## 2022-07-02 NOTE — Progress Notes (Signed)
Paramedicine Encounter    Patient ID: Dale [name], male    DOB: Mar 12, 1961, 61 y.o.   MRN: [id number]   There were no vitals taken for this visit. Weight yesterday-not taken Last visit weight-234lb ATF Dale [name] A&O x4,skin W&D w/ good color.  Pt has not done well in being compliant with his medications.  He did not take his Friday doses which contained his Metolazone.  He had also been instructed to increase his Potassium to 60 meq  BID.  Pt's weight is up 12lb from last visit.  He denies chest pain or SOB but has significant pitting edema to his lower extremities with some noted JVD.  Lung sounds clear and equal bilat.  Contacted [facility] Clinic to inquire about med changes.  Per Dr. [name] Dale [name] is to take a Metolazone today, skip Friday's Metalazone dose.  Take a Metolazone on Sunday and then add it back to Friday next week.  Add an additional 37meq's of Potassium on Sunday and Friday for a total of 100 meq.  Pt. Has bloodwork coming up on 9/1 and Digoxin held for this visit.  Pt states he will wait till he gets home before he takes his meds.  Med box reconciled.  Home visit complete.    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) 06/13/2021   CHF (congestive heart failure) (HCC) 06/13/2021   Acute on chronic systolic CHF (congestive heart failure), NYHA class 4 (HCC)    CKD  (chronic kidney disease), stage III (HCC) 06/01/2021   Hypokalemia    Acute on chronic HFrEF (heart failure with reduced ejection fraction) (HCC) 12/25/2020   Coronary artery disease involving native coronary artery of native heart without angina pectoris    Acute kidney injury superimposed on chronic kidney disease (HCC) 10/16/2020   Acute on chronic combined systolic and diastolic HF (heart failure) (HCC) 09/08/2020   Acute on chronic combined systolic (congestive) and diastolic (congestive) heart failure (HCC)    Acute CHF (congestive heart failure) (HCC) 05/07/2019   Nausea & vomiting 05/07/2019   Acute exacerbation of CHF (congestive heart failure) (HCC) 03/11/2019   Troponin level elevated 03/11/2019   Hyperlipidemia 03/03/2019   NSVT (nonsustained ventricular tachycardia) (HCC) 03/03/2019   Anxiety and depression    NSTEMI (non-ST elevated myocardial infarction) (HCC) 02/28/2019   Acute systolic heart failure (HCC) 11/08/2018   Hypertensive urgency 09/28/2018   Acute pulmonary edema (HCC) 09/28/2018   Cocaine abuse (HCC) 09/28/2018   Substance abuse (HCC) 09/28/2018   Chronic kidney disease, stage II (mild) 09/28/2018   Normochromic anemia 09/28/2018   Acute systolic CHF (congestive heart failure) (HCC) 09/27/2018   Essential hypertension 03/11/2016   Obesity (BMI 30-39.9) 03/11/2016   Glucosuria 03/11/2016    Current Outpatient Medications:    aspirin 81 MG EC tablet, Take 1 tablet (81 mg total) by mouth daily. Swallow whole., Disp: 30 tablet, Rfl: 2  atorvastatin (LIPITOR) 40 MG tablet, TAKE ONE TABLET BY MOUTH ONCE DAILY FOR CHOLESTEROL, Disp: 90 tablet, Rfl: 3   digoxin (LANOXIN) 0.125 MG tablet, TAKE ONE TABLET BY MOUTH ONCE DAILY, Disp: 90 tablet, Rfl: 3   FARXIGA 10 MG TABS tablet, TAKE ONE TABLET BY MOUTH ONCE DAILY, Disp: 90 tablet, Rfl: 3   metolazone (ZAROXOLYN) 2.5 MG tablet, Take 1 tablet (2.5 mg total) by mouth every Friday. Take an additional 2 tablets of  potassium, Disp: 4 tablet, Rfl: 0   potassium chloride SA (KLOR-CON M) 20 MEQ tablet, Take 3 tablets by mouth twice a day., Disp: 270 tablet, Rfl: 11   torsemide (DEMADEX) 20 MG tablet, Take 4 tablets (80 mg total) by mouth 2 (two) times daily., Disp: 120 tablet, Rfl: 5 Allergies  Allergen Reactions   Hydrocodone Itching      Social History   Socioeconomic History   Marital status: Single    Spouse name: none   Number of children: Not on file   Years of education: Not on file   Highest education level: Not on file  Occupational History   Occupation: Disabled  Tobacco Use   Smoking status: Never   Smokeless tobacco: Never  Vaping Use   Vaping Use: Never used  Substance and Sexual Activity   Alcohol use: Yes    Comment: 1 quarts a week   Drug use: Not Currently    Frequency: 3.0 times per week    Types: Marijuana, Cocaine   Sexual activity: Not Currently  Other Topics Concern   Not on file  Social History Narrative   Lives w/ mother in [place] but recently visiting w/ dtr in [place].   Social Determinants of Health   Financial Resource Strain: High Risk (02/20/2022)   Overall Financial Resource Strain (CARDIA)    Difficulty of Paying Living Expenses: Hard  Food Insecurity: Food Insecurity Present (04/19/2022)   Hunger Vital Sign    Worried About Running Out of Food in the Last Year: Sometimes true    Ran Out of Food in the Last Year: Sometimes true  Transportation Needs: Unmet Transportation Needs (05/03/2022)   PRAPARE - Administrator, Civil Service (Medical): Yes    Lack of Transportation (Non-Medical): Yes  Physical Activity: Not on file  Stress: Not on file  Social Connections: Not on file  Intimate Partner Violence: Not on file    Physical Exam      Future Appointments  Date Time Provider Department Center  07/05/2022 10:30 AM [facility] None  08/12/2022 10:30 AM Claiborne Rigg, NP [facility] None       [name],  EMT-Paramedic [phone] [facility] Paramedic  07/02/22

## 2022-07-05 ENCOUNTER — Ambulatory Visit (HOSPITAL_COMMUNITY)
Admission: RE | Admit: 2022-07-05 | Discharge: 2022-07-05 | Disposition: A | Discharge status: Home / Self Care | Payer: Medicaid Other | Source: Ambulatory Visit | Attending: Cardiology | Admitting: Cardiology

## 2022-07-05 DIAGNOSIS — E876 Hypokalemia: Secondary | ICD-10-CM | POA: Insufficient documentation

## 2022-07-05 LAB — BASIC METABOLIC PANEL
Anion gap: 12 (ref 5–15)
BUN: 29 mg/dL — ABNORMAL HIGH (ref 6–20)
CO2: 27 mmol/L (ref 22–32)
Calcium: 8.4 mg/dL — ABNORMAL LOW (ref 8.9–10.3)
Chloride: 100 mmol/L (ref 98–111)
Creatinine, Ser: 1.59 mg/dL — ABNORMAL HIGH (ref 0.61–1.24)
GFR, Estimated: 50 mL/min — ABNORMAL LOW (ref >60)
Glucose, Bld: 97 mg/dL (ref 70–99)
Potassium: 3.5 mmol/L (ref 3.5–5.1)
Sodium: 139 mmol/L (ref 135–145)

## 2022-07-12 ENCOUNTER — Other Ambulatory Visit (HOSPITAL_COMMUNITY): Payer: Self-pay | Admitting: Family Medicine

## 2022-07-12 ENCOUNTER — Other Ambulatory Visit (HOSPITAL_COMMUNITY): Payer: Self-pay | Admitting: Emergency Medicine

## 2022-07-12 NOTE — Progress Notes (Signed)
Attempted to to a home visit with Mr. Due today.  Met him and he tells me that there person he lives with on [address] has thrown his meds away and will not let him back into the apartment.   I reached out to Liz [employer]ib[employer] who said he would reach out to [facility] to try and get an emergency refill for him.  He also advises that Mr. Feig needs a refill on his Metolazone.  I sent a message to HF Triage for this refill to be sent to [facility].   In the meantime, Mr. Vanvranken and I have been playing phone tag and there has been no resolution to this medication dilemma.  I will reach back out to him on Monday.

## 2022-07-15 ENCOUNTER — Other Ambulatory Visit (HOSPITAL_COMMUNITY): Payer: Self-pay | Admitting: *Deleted

## 2022-07-15 ENCOUNTER — Other Ambulatory Visit (HOSPITAL_COMMUNITY): Payer: Self-pay | Admitting: Emergency Medicine

## 2022-07-15 ENCOUNTER — Other Ambulatory Visit: Payer: Self-pay

## 2022-07-15 ENCOUNTER — Telehealth (HOSPITAL_COMMUNITY): Payer: Self-pay | Admitting: Emergency Medicine

## 2022-07-15 ENCOUNTER — Other Ambulatory Visit (HOSPITAL_COMMUNITY): Payer: Self-pay | Admitting: Family Medicine

## 2022-07-15 NOTE — Telephone Encounter (Signed)
Spoke with Dale [name] this morning to follow-up from last weeks events.  He advised he was [place] his way [place] [employer] [place] "handle some business" and said he will reach out to me later today.

## 2022-07-15 NOTE — Progress Notes (Signed)
After receiving a call from Mr. Newby who told me he was in jail, I went by where he's been staying at his request and told his friend [name] that he was in jail.  While there, I picked up his med bag which contained his medications and pill box.  I then went by [facility] and picked up his refills for Torsemide and Potassium Chloride.  I reconciled his pill box without incident.  I was unable to tell when he last took his meds.  I reached out to the jail and spoke to "[place]" and she advised that she needed a med list faxed to them to confirm his meds and they would make sure he gets them.  Then called Meredith Staggers at the [facility] Clinic and requested her to fax his med list to [phone].  Should Elon Jester need to be reached by phone her contact number is [phone].   He is scheduled to get released on Wednesday.  I have secured his medications, pill box and camouflage med bag in a locked cabinet in my office and will return them to him when he is released.     [name], EMT-Paramedic [phone] 07/15/2022

## 2022-07-15 NOTE — Telephone Encounter (Signed)
Dale [name] called me @ 11:35 a.m. this morning and advised me that he was in jail.  He told me that he did not have any of his meds with him.  He also asked if I could go by where he's been staying and tell [name] where he is and I told him I would take care of it.

## 2022-07-15 NOTE — Telephone Encounter (Signed)
Called Dale [name] @ 10:33 this morning to schedule a home visit.  He advised that he was going to [place] [employer] [place] to "take care of some business."  He will call me later to schedule a time for a visit.

## 2022-07-17 ENCOUNTER — Telehealth (HOSPITAL_COMMUNITY): Payer: Self-pay | Admitting: Licensed Clinical Social Worker

## 2022-07-17 NOTE — Telephone Encounter (Signed)
HF Paramedicine Team Based Care Meeting  HF MD- NA  HF NP - Amy Clegg NP-C   [facility] HF Paramedicine  Katie Vicente Males  [facility] admit within the last 30 days for heart failure? no  Medications concerns? Inconsistent compliance with taking meds  SDOH - recently in jail for violation of sex offender guidelines, some housing insecurity concerns but no interventions indicated  Eligible for discharge? Been having frequent med changes which makes it hard to transition to pill packs and work towards DC- continues to struggle with fluid retention.  Burna Sis, LCSW Clinical Social Worker [facility] Desk#: [phone] Cell#: [phone]

## 2022-07-18 ENCOUNTER — Other Ambulatory Visit (HOSPITAL_COMMUNITY): Payer: Self-pay | Admitting: Emergency Medicine

## 2022-07-18 NOTE — Progress Notes (Signed)
Paramedicine Encounter    Patient ID: Dale [name], male    DOB: 11-Nov-1960, 61 y.o.   MRN: [id number]   There were no vitals taken for this visit. Weight yesterday-not taken Last visit weight-247lb  Home visit with [name] today finds him A&O x 4, skin W&D w/ good color.  Pt was emotional during our visit today.  He was recently incarcerated and is still very upset about his situation.  I tried to re-assure him that things would hopefully get better with time but in the meantime, I encouraged him to not let recent events get him off track with being compliant with his medications and taking care of himself.  He had odor of ETOH on his breath and admitted to having "one beer".   Med box reconciled.  Weight is down.  He denies chest pain or SOB.  Lung sounds clear throughout and no edema noted.  Home visit complete.    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) 06/13/2021   CHF (congestive heart failure) (HCC) 06/13/2021   Acute on chronic systolic CHF (congestive heart failure), NYHA class 4 (HCC)    CKD (chronic kidney disease), stage III (HCC) 06/01/2021   Hypokalemia    Acute on chronic HFrEF (heart failure with reduced ejection fraction) (HCC) 12/25/2020   Coronary artery disease involving native coronary artery of native heart without angina pectoris    Acute  kidney injury superimposed on chronic kidney disease (HCC) 10/16/2020   Acute on chronic combined systolic and diastolic HF (heart failure) (HCC) 09/08/2020   Acute on chronic combined systolic (congestive) and diastolic (congestive) heart failure (HCC)    Acute CHF (congestive heart failure) (HCC) 05/07/2019   Nausea & vomiting 05/07/2019   Acute exacerbation of CHF (congestive heart failure) (HCC) 03/11/2019   Troponin level elevated 03/11/2019   Hyperlipidemia 03/03/2019   NSVT (nonsustained ventricular tachycardia) (HCC) 03/03/2019   Anxiety and depression    NSTEMI (non-ST elevated myocardial infarction) (HCC) 02/28/2019   Acute systolic heart failure (HCC) 11/08/2018   Hypertensive urgency 09/28/2018   Acute pulmonary edema (HCC) 09/28/2018   Cocaine abuse (HCC) 09/28/2018   Substance abuse (HCC) 09/28/2018   Chronic kidney disease, stage II (mild) 09/28/2018   Normochromic anemia 09/28/2018   Acute systolic CHF (congestive heart failure) (HCC) 09/27/2018   Essential hypertension 03/11/2016   Obesity (BMI 30-39.9) 03/11/2016   Glucosuria 03/11/2016    Current Outpatient Medications:    atorvastatin (LIPITOR) 40 MG tablet, TAKE ONE TABLET BY MOUTH ONCE DAILY FOR CHOLESTEROL, Disp: 90 tablet, Rfl: 3   digoxin (LANOXIN) 0.125 MG tablet, TAKE ONE TABLET BY MOUTH ONCE DAILY, Disp: 90 tablet, Rfl: 3   FARXIGA 10 MG TABS tablet, TAKE ONE TABLET BY MOUTH ONCE DAILY, Disp: 90 tablet, Rfl: 3   [START ON 07/19/2022] metolazone (ZAROXOLYN) 2.5 MG tablet, TAKE 1 TABLET (2.5 MG TOTAL) BY MOUTH EVERY FRIDAY. TAKE  AN ADDITIONAL 2 TABLETS OF POTASSIUM, Disp: 4 tablet, Rfl: 1   potassium chloride SA (KLOR-CON M) 20 MEQ tablet, Take 3 tablets by mouth twice a day., Disp: 270 tablet, Rfl: 11   torsemide (DEMADEX) 20 MG tablet, Take 4 tablets (80 mg total) by mouth 2 (two) times daily., Disp: 120 tablet, Rfl: 5   aspirin 81 MG EC tablet, Take 1 tablet (81 mg total) by mouth daily. Swallow whole.  (Patient not taking: Reported on 07/18/2022), Disp: 30 tablet, Rfl: 2 Allergies  Allergen Reactions   Hydrocodone Itching      Social History   Socioeconomic History   Marital status: Single    Spouse name: none   Number of children: Not on file   Years of education: Not on file   Highest education level: Not on file  Occupational History   Occupation: Disabled  Tobacco Use   Smoking status: Never   Smokeless tobacco: Never  Vaping Use   Vaping Use: Never used  Substance and Sexual Activity   Alcohol use: Yes    Comment: 1 quarts a week   Drug use: Not Currently    Frequency: 3.0 times per week    Types: Marijuana, Cocaine   Sexual activity: Not Currently  Other Topics Concern   Not on file  Social History Narrative   Lives w/ mother in [place] but recently visiting w/ dtr in [place].   Social Determinants of Health   Financial Resource Strain: High Risk (02/20/2022)   Overall Financial Resource Strain (CARDIA)    Difficulty of Paying Living Expenses: Hard  Food Insecurity: Food Insecurity Present (04/19/2022)   Hunger Vital Sign    Worried About Running Out of Food in the Last Year: Sometimes true    Ran Out of Food in the Last Year: Sometimes true  Transportation Needs: Unmet Transportation Needs (05/03/2022)   PRAPARE - Administrator, Civil Service (Medical): Yes    Lack of Transportation (Non-Medical): Yes  Physical Activity: Not on file  Stress: Not on file  Social Connections: Not on file  Intimate Partner Violence: Not on file    Physical Exam      Future Appointments  Date Time Provider Department Center  08/12/2022 10:30 AM Claiborne Rigg, NP [facility] None       [name], EMT-Paramedic [phone] [facility] Paramedic  07/18/22

## 2022-07-24 ENCOUNTER — Telehealth (HOSPITAL_COMMUNITY): Payer: Self-pay | Admitting: Emergency Medicine

## 2022-07-24 NOTE — Telephone Encounter (Signed)
Spoke to Dale [name] to see if he was available for our afternoon visit.  He advised he was not home at this time and I told him I would call him later in hopes of completing a home visit.  I did pick up his [place] for this visit/med reconciliation    [name], [place] 07/24/2022

## 2022-07-26 ENCOUNTER — Other Ambulatory Visit (HOSPITAL_COMMUNITY): Payer: Self-pay | Admitting: Emergency Medicine

## 2022-07-26 NOTE — Progress Notes (Signed)
No vitals taken today as Dale [name] was not home for med rec.  I had picked up his Metolazone from [place] and was able to include it in his med regimen as he takes it on Friday's.  He has not done well with his med compliance.  He has been briefly incarcerated and is out now and "dealing with a bunch of things" he says.   I will follow up with him next week and get vitals for a more complete visit.    [name], [name] 07/26/2022

## 2022-08-07 ENCOUNTER — Other Ambulatory Visit (HOSPITAL_COMMUNITY): Payer: Self-pay | Admitting: Emergency Medicine

## 2022-08-07 NOTE — Progress Notes (Signed)
Paramedicine Encounter    Patient ID: Dale [name], male    DOB: [birth year], 61 y.o.   MRN: [id number]   BP 120/70 (BP Location: Right Arm, Patient Position: Sitting, Cuff Size: Normal)   Pulse 82   Resp 14   Wt 224 lb 6.4 oz (101.8 kg)   SpO2 98%   BMI 31.30 kg/m  Weight yesterday-not taken Last visit weight-238lb  ATF Dale [name] A&O x 4, skin W&D w/ good color.  Pt has no complaints of chest pain or SOB.  Lung sounds clear and equal bilat.  No edema noted to his extremities.  Dale [name] has taken less than half of his medication regimen for the week.  "I'm just going through a lot," he says.  Med box reconciled for two weeks.  Refills called into [facility] for Farxiga, Metolazone, Dig & Torsemide.   Pt says he's still trying to find another place to live as he is frustrated with his current living arrangements.  We are frequently rescheduling his home visits because his roommate doesn't want me there.  Home visit complete today and next home visit scheduled for 2 weeks out.    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 ([place]) 06/13/2021   CHF (congestive heart failure) ([place]) 06/13/2021   Acute on chronic systolic CHF (congestive heart failure), NYHA class 4 ([place])    CKD (chronic kidney disease), stage III ([place]) 06/01/2021   Hypokalemia    Acute on chronic HFrEF (heart  failure with reduced ejection fraction) ([place]) 12/25/2020   Coronary artery disease involving native coronary artery of native heart without angina pectoris    Acute kidney injury superimposed on chronic kidney disease ([place]) 10/16/2020   Acute on chronic combined systolic and diastolic HF (heart failure) ([place]) 09/08/2020   Acute on chronic combined systolic (congestive) and diastolic (congestive) heart failure ([place])    Acute CHF (congestive heart failure) ([place]) 05/07/2019   Nausea & vomiting 05/07/2019   Acute exacerbation of CHF (congestive heart failure) ([place]) 03/11/2019   Troponin level elevated 03/11/2019   Hyperlipidemia 03/03/2019   NSVT (nonsustained ventricular tachycardia) ([place]) 03/03/2019   Anxiety and depression    NSTEMI (non-ST elevated myocardial infarction) ([place]) [id number]   Acute systolic heart failure ([place]) 11/08/2018   Hypertensive urgency 09/28/2018   Acute pulmonary edema ([place]) 09/28/2018   Cocaine abuse ([place]) 09/28/2018   Substance abuse ([place]) 09/28/2018   Chronic kidney disease, stage II (mild) 09/28/2018   Normochromic anemia [id number]   Acute systolic CHF (congestive heart failure) ([place]) 09/27/2018   Essential hypertension 03/11/2016   Obesity (BMI 30-39.9) 03/11/2016   Glucosuria 03/11/2016    Current Outpatient Medications:    atorvastatin (LIPITOR) 40 MG tablet, TAKE ONE TABLET BY MOUTH ONCE DAILY FOR CHOLESTEROL, Disp: 90 tablet, Rfl: 3   digoxin (LANOXIN) 0.125 MG tablet, TAKE ONE TABLET BY MOUTH ONCE DAILY, Disp: 90 tablet, Rfl: 3   FARXIGA  10 MG TABS tablet, TAKE ONE TABLET BY MOUTH ONCE DAILY, Disp: 90 tablet, Rfl: 3   metolazone (ZAROXOLYN) 2.5 MG tablet, TAKE 1 TABLET (2.5 MG TOTAL) BY MOUTH EVERY FRIDAY. TAKE AN ADDITIONAL 2 TABLETS OF POTASSIUM, Disp: 4 tablet, Rfl: 1   potassium chloride SA (KLOR-CON M) 20 MEQ tablet, Take 3 tablets by mouth twice a day., Disp: 270 tablet, Rfl: 11   torsemide (DEMADEX) 20 MG tablet, Take 4 tablets (80 mg total) by  mouth 2 (two) times daily., Disp: 120 tablet, Rfl: 5   aspirin 81 MG EC tablet, Take 1 tablet (81 mg total) by mouth daily. Swallow whole. (Patient not taking: Reported on 07/18/2022), Disp: 30 tablet, Rfl: 2 Allergies  Allergen Reactions   Hydrocodone Itching      Social History   Socioeconomic History   Marital status: Single    Spouse name: none   Number of children: Not on file   Years of education: Not on file   Highest education level: Not on file  Occupational History   Occupation: Disabled  Tobacco Use   Smoking status: Never   Smokeless tobacco: Never  Vaping Use   Vaping Use: Never used  Substance and Sexual Activity   Alcohol use: Yes    Comment: 1 quarts a week   Drug use: Not Currently    Frequency: 3.0 times per week    Types: Marijuana, Cocaine   Sexual activity: Not Currently  Other Topics Concern   Not on file  Social History Narrative   Lives w/ mother in [place] but recently visiting w/ dtr in [place].   Social Determinants of Health   Financial Resource Strain: High Risk (02/20/2022)   Overall Financial Resource Strain (CARDIA)    Difficulty of Paying Living Expenses: Hard  Food Insecurity: Food Insecurity Present (04/19/2022)   Hunger Vital Sign    Worried About Running Out of Food in the Last Year: Sometimes true    Ran Out of Food in the Last Year: Sometimes true  Transportation Needs: Unmet Transportation Needs (05/03/2022)   PRAPARE - Hydrologist (Medical): Yes    Lack of Transportation (Non-Medical): Yes  Physical Activity: Not on file  Stress: Not on file  Social Connections: Not on file  Intimate Partner Violence: Not on file    Physical Exam      Future Appointments  Date Time Provider [place]  08/12/2022 10:30 AM Gildardo Pounds, NP [place], [place] Paramedic  08/07/22

## 2022-08-08 ENCOUNTER — Telehealth (HOSPITAL_COMMUNITY): Payer: Self-pay | Admitting: Emergency Medicine

## 2022-08-08 ENCOUNTER — Ambulatory Visit: Payer: Self-pay

## 2022-08-08 NOTE — Telephone Encounter (Signed)
Summary: vomiting for the past 3-4 weeks   Pt stated he has been vomiting for the past 3-4 weeks and can't hold anything down. Pt was upset that [facility] rescheduled his appointment from 08/12/2021 to 10/09/2022 at a different office due to the provider being out of the office.   While on the line, [facility] called pt to let him know they could not see him here; there was no availability. Pt put me on hold and never came back on the line. I disconnected.    Sending clinical due to pt mentioned symptoms      Call cannot be completed at this time.

## 2022-08-08 NOTE — Telephone Encounter (Signed)
[name] called upset with [facility] because they call him today and cancelled his new patient appointment because the provider was going to be gone.  They rescheduled him for December and put him on a wait list in case of cancellation.  He is still complaining of nausea and vomiting.  I advised him that perhaps he should seek evaluation at an Urgent Care.

## 2022-08-08 NOTE — Telephone Encounter (Signed)
  Chief Complaint: Vomiting Symptoms: Vomiting 4-5 times a day "For a month." States cannot keep anything down, stomach burning after vomiting. No other symptoms. Pt is on digoxin Frequency: 3-4 weeks Pertinent Negatives: Patient denies fever,  nausea, diarrhea,headache. Disposition: [] ED /[] Urgent Care (no appt availability in office) / [] Appointment(In office/virtual)/ []  [facility]/ [] Home Care/ [] Refused Recommended Disposition /[x] [facility]/ []  Follow-up with PCP Additional Notes: [facility], states will follow disposition. Care advise provided.  Pt is on Digoxin Reason for Disposition  Taking any of the following medications: digoxin (Lanoxin), lithium, theophylline, phenytoin (Dilantin)  Answer Assessment - Initial Assessment Questions 1. VOMITING SEVERITY: "How many times have you vomited in the past 24 hours?"     - MILD:  1 - 2 times/day    - MODERATE: 3 - 5 times/day, decreased oral intake without significant weight loss or symptoms of dehydration    - SEVERE: 6 or more times/day, vomits everything or nearly everything, with significant weight loss, symptoms of dehydration      4-5 times 2. ONSET: "When did the vomiting begin?"      3-4 weeks ago 3. FLUIDS: "What fluids or food have you vomited up today?" "Have you been able to keep any fluids down?"     None 4. ABDOMEN PAIN: "Are your having any abdomen pain?" If Yes : "How bad is it and what does it feel like?" (e.g., crampy, dull, intermittent, constant)      When vomiting "Burning sensation." 5. DIARRHEA: "Is there any diarrhea?" If Yes, ask: "How many times today?"      no 6. CONTACTS: "Is there anyone else in the family with the same symptom     no 7. CAUSE: "What do you think is causing your vomiting?"    Pt is on Digoxin. 8. HYDRATION STATUS: "Any signs of dehydration?" (e.g., dry mouth [not only dry lips], too weak to stand) "When did you last urinate?"     no 9. OTHER SYMPTOMS:  "Do you have any other symptoms?" (e.g., fever, headache, vertigo, vomiting blood or coffee grounds, recent head injury)     None  Protocols used: Vomiting-A-AH

## 2022-08-08 NOTE — Telephone Encounter (Signed)
Dale [name] called stating his blood pressure was low.  He went and had it check this morning.  He advised it was 120/70 which I told him that pressure was within normal limits.  He denies dizziness or SOB and no chest pain.  He does complain of nausea with vomiting which he says has been going on for a couple of weeks.  He thought it might be his medicines but he's had no change in his meds for quite some time.  I advised him that he may wish to go to urgent care or see his primary care physician.  He as an appointment coming up on the 9th with his PCP.  He advised he would just talk to her about it then.    [name], [place] 08/08/2022

## 2022-08-09 ENCOUNTER — Telehealth (HOSPITAL_COMMUNITY): Payer: Self-pay

## 2022-08-09 NOTE — Telephone Encounter (Signed)
Tried calling patient to check in on him and give hi the medications changes Dr. Aundra Dubin wants him to  do. No answer and was unable to leave a message. Will try again later

## 2022-08-12 ENCOUNTER — Ambulatory Visit: Payer: Medicaid Other | Admitting: Nurse Practitioner

## 2022-08-27 ENCOUNTER — Other Ambulatory Visit (HOSPITAL_COMMUNITY): Payer: Self-pay | Admitting: Cardiology

## 2022-08-27 ENCOUNTER — Other Ambulatory Visit (HOSPITAL_COMMUNITY): Payer: Self-pay | Admitting: Emergency Medicine
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
## 2022-08-29 ENCOUNTER — Telehealth (HOSPITAL_COMMUNITY): Payer: Self-pay

## 2022-08-29 ENCOUNTER — Telehealth (HOSPITAL_COMMUNITY): Payer: Self-pay | Admitting: Emergency Medicine

## 2022-08-29 NOTE — Telephone Encounter (Signed)
Called Mr. Mccaul to remind him of his visit at HF clinic tomorrow @ 2:00.  He said he would make it.  And I reminded him to bring his meds and pill box with him and he agreed to do so.    [name], [place] 08/29/2022

## 2022-08-29 NOTE — Telephone Encounter (Signed)
Called and left patient a message  to confirm/remind patient of their appointment at the [facility] on 08/30/22.

## 2022-08-30 ENCOUNTER — Other Ambulatory Visit (HOSPITAL_COMMUNITY): Payer: Self-pay | Admitting: Emergency Medicine

## 2022-08-30 ENCOUNTER — Encounter (HOSPITAL_COMMUNITY): Payer: Self-pay

## 2022-08-30 ENCOUNTER — Ambulatory Visit (HOSPITAL_COMMUNITY)
Admission: RE | Admit: 2022-08-30 | Discharge: 2022-08-30 | Disposition: A | Discharge status: Home / Self Care | Payer: Medicaid Other | Source: Ambulatory Visit | Attending: Family Medicine | Admitting: Family Medicine

## 2022-08-30 VITALS — BP 90/66 | HR 77 | Wt 252.0 lb

## 2022-08-30 DIAGNOSIS — I5022 Chronic systolic (congestive) heart failure: Secondary | ICD-10-CM | POA: Diagnosis not present

## 2022-08-30 DIAGNOSIS — Z139 Encounter for screening, unspecified: Secondary | ICD-10-CM

## 2022-08-30 DIAGNOSIS — F1010 Alcohol abuse, uncomplicated: Secondary | ICD-10-CM | POA: Diagnosis not present

## 2022-08-30 DIAGNOSIS — I130 Hypertensive heart and chronic kidney disease with heart failure and stage 1 through stage 4 chronic kidney disease, or unspecified chronic kidney disease: Secondary | ICD-10-CM | POA: Diagnosis present

## 2022-08-30 DIAGNOSIS — F1410 Cocaine abuse, uncomplicated: Secondary | ICD-10-CM | POA: Diagnosis not present

## 2022-08-30 DIAGNOSIS — F1290 Cannabis use, unspecified, uncomplicated: Secondary | ICD-10-CM | POA: Diagnosis not present

## 2022-08-30 DIAGNOSIS — R112 Nausea with vomiting, unspecified: Secondary | ICD-10-CM | POA: Insufficient documentation

## 2022-08-30 DIAGNOSIS — F17200 Nicotine dependence, unspecified, uncomplicated: Secondary | ICD-10-CM | POA: Diagnosis not present

## 2022-08-30 DIAGNOSIS — Z7982 Long term (current) use of aspirin: Secondary | ICD-10-CM | POA: Diagnosis not present

## 2022-08-30 DIAGNOSIS — N183 Chronic kidney disease, stage 3 unspecified: Secondary | ICD-10-CM | POA: Insufficient documentation

## 2022-08-30 DIAGNOSIS — I272 Pulmonary hypertension, unspecified: Secondary | ICD-10-CM | POA: Diagnosis not present

## 2022-08-30 DIAGNOSIS — Z79899 Other long term (current) drug therapy: Secondary | ICD-10-CM | POA: Diagnosis not present

## 2022-08-30 DIAGNOSIS — F1021 Alcohol dependence, in remission: Secondary | ICD-10-CM | POA: Diagnosis not present

## 2022-08-30 DIAGNOSIS — Z5901 Sheltered homelessness: Secondary | ICD-10-CM | POA: Diagnosis not present

## 2022-08-30 DIAGNOSIS — I2510 Atherosclerotic heart disease of native coronary artery without angina pectoris: Secondary | ICD-10-CM | POA: Insufficient documentation

## 2022-08-30 DIAGNOSIS — I739 Peripheral vascular disease, unspecified: Secondary | ICD-10-CM

## 2022-08-30 DIAGNOSIS — I5023 Acute on chronic systolic (congestive) heart failure: Secondary | ICD-10-CM | POA: Diagnosis not present

## 2022-08-30 DIAGNOSIS — I959 Hypotension, unspecified: Secondary | ICD-10-CM | POA: Diagnosis not present

## 2022-08-30 LAB — DIGOXIN LEVEL: Digoxin Level: 0.5 ng/mL — ABNORMAL LOW (ref 0.8–2.0)

## 2022-08-30 LAB — COMPREHENSIVE METABOLIC PANEL WITH GFR
ALT: 12 U/L (ref 0–44)
AST: 15 U/L (ref 15–41)
Albumin: 2.6 g/dL — ABNORMAL LOW (ref 3.5–5.0)
Alkaline Phosphatase: 55 U/L (ref 38–126)
Anion gap: 6 (ref 5–15)
BUN: 18 mg/dL (ref 6–20)
CO2: 28 mmol/L (ref 22–32)
Calcium: 7.5 mg/dL — ABNORMAL LOW (ref 8.9–10.3)
Chloride: 108 mmol/L (ref 98–111)
Creatinine, Ser: 1.27 mg/dL — ABNORMAL HIGH (ref 0.61–1.24)
GFR, Estimated: >60 mL/min
Glucose, Bld: 76 mg/dL (ref 70–99)
Potassium: 3.5 mmol/L (ref 3.5–5.1)
Sodium: 142 mmol/L (ref 135–145)
Total Bilirubin: 0.6 mg/dL (ref 0.3–1.2)
Total Protein: 5.2 g/dL — ABNORMAL LOW (ref 6.5–8.1)

## 2022-08-30 LAB — BRAIN NATRIURETIC PEPTIDE: B Natriuretic Peptide: 1774.9 pg/mL — ABNORMAL HIGH (ref 0.0–100.0)

## 2022-08-30 MED ORDER — TORSEMIDE 20 MG PO TABS: 40.0000 mg | ORAL_TABLET | Freq: Every day | ORAL | 3 refills | Status: DC

## 2022-08-30 MED ORDER — PANTOPRAZOLE SODIUM 40 MG PO TBEC: 40.0000 mg | DELAYED_RELEASE_TABLET | Freq: Every day | ORAL | 0 refills | Status: DC

## 2022-08-30 MED ORDER — POTASSIUM CHLORIDE CRYS ER 20 MEQ PO TBCR: 40.0000 meq | EXTENDED_RELEASE_TABLET | Freq: Every day | ORAL | 3 refills | Status: DC

## 2022-08-30 NOTE — Patient Instructions (Addendum)
Thank you for coming in today  Labs were done today, if any labs are abnormal the clinic will call you No news is good news  START Protonix 40 mg 1 tablet daily for 14 days  RESTART Torsemide 40 mg 2 tablets daily RESTART Potassium 40 meq 2 tablets daily   You have been referred to GI for vomiting and they will call you for appointment details.  Your physician recommends that you return for lab work in: 10.14 days for BMET  Your physician recommends that you schedule a follow-up appointment in:  4 weeks in clinic 3 months with Dr. Aundra Dubin    Do the following things EVERYDAY: Weigh yourself in the morning before breakfast. Write it down and keep it in a log. Take your medicines as prescribed Eat low salt foods--Limit salt (sodium) to 2000 mg per day.  Stay as active as you can everyday Limit all fluids for the day to less than 2 liters At the [facility], you and your health needs are our priority. As part of our continuing mission to provide you with exceptional heart care, we have created designated Provider Care Teams. These Care Teams include your primary Cardiologist (physician) and Advanced Practice Providers (APPs- Physician Assistants and Nurse Practitioners) who all work together to provide you with the care you need, when you need it.   You may see any of the following providers on your designated Care Team at your next follow up: Dr Glori Bickers Dr Loralie Champagne Dr. Roxana Hires, NP Lyda Jester, [place] [facility], [place] Forestine Na, NP Audry Riles, PharmD   Please be sure to bring in all your medications bottles to every appointment.   If you have any questions or concerns before your next appointment please send [place] a message through [place] or call our office at [phone].    TO LEAVE A MESSAGE FOR THE NURSE SELECT OPTION 2, PLEASE LEAVE A MESSAGE INCLUDING: YOUR NAME DATE OF BIRTH CALL BACK  NUMBER REASON FOR CALL**this is important as we prioritize the call backs  YOU WILL RECEIVE A CALL BACK THE SAME DAY AS LONG AS YOU CALL BEFORE 4:00 PM

## 2022-08-30 NOTE — Progress Notes (Signed)
Paramedicine Encounter   Patient ID: Renley Banwart , male,   DOB: 02-03-1961,61 y.o.,  MRN: [id number]   Met patient in clinic today with provider.  Time spent with patient 60 minutes  Clinic visit with provider Keturah Shavers, NP with Mr. Poser.  He was A&O x 4, skin W&D w/ good color.  Pt is up 11lbs since 10/24.  He has been off his fluid pills and pot cl for almost 2 weeks as he has been complaining about chronic vomiting for several weeks.  He admits to ETOH consumption today and having smoked mariajuana prior to the visit today.  Redz clip 35%.  Edema to his lower extremities with JVD.  He says he has intermittent chest pain that is sharp and stabbing in nature and comes on without provocation.  Today Janett Billow advises him to add back 6m. Torsemide daily, 435m Potassium Cl daily and Protonix  4067mOnce daily.    I added back the fluid pill and K back into his med regimen and reconciled box for 2 weeks.  Protonix will need to be added later after it's filled.   Visit complete.    [place]enee Ramus[place]/27/2023

## 2022-08-30 NOTE — Progress Notes (Signed)
ADVANCED HF CLINIC NOTE  PCP: None HF Cardiologist: Dr. [name]  HPI: Dale [name] is a 61 y.o. male  with a PMH significant for hypertension, polysubstance use (including cocaine & alcohol), chronic combined CHF, and CAD.   He was admitted 11/19 with signs/symptoms of CHF, in the setting of poorly controlled hypertension alcohol and cocaine use. He was started on IV Lasix and echo showed EF 20%, G2 DD.  He left AMA.   Readmitted 12/19 with CHF exacerbation, started on IV diuretics, beta-blocker, ARB and BiDil.  Discharged on oral torsemide.  He remained positive for cocaine on UDS.   Admitted 4/20 with chest pain and CHF. Diagnosed with NSTEMI. He declined cardiac catheterization.     Admitted 5/20 with CHF and chest pain, still using cocaine, but no evidence of ischemia.  Diuresed and discharged.  Echo (5/20) EF 20 to 25%, elevated LVEDP, diffuse hypokinesis with worse hypokinesis and inferior wall moderately enlarged RV with normal function.  Admitted in 7/20 with acute CHF and underwent L/RHC showing severe single vessel CAD with CTO of pRCA, mild to moderate non-obs CAD involving LAD and LCx, moderately elevated left heart filling pressures, severely elevated right heart filling pressures, mild pulmonary hypertension and normal CO/CI by Fick. He was treated medically.  2 CHF admissions 2021. 6 admissions in 2022.    Admitted 7/22 with NSTEMI and troponin > 1000,  but treated medically due to continued cocaine use and concern for poor medication adherence if stent was needed.   Admitted 8/22 with CHF exacerbation.  Echo with EF <10%, medications restarted. Farxiga and spiro added.    He was [facility] in [facility] 9/22, doing well from CHF standpoint and compliant with medication regimen.   Admitted again in 4/23 with CHF, refused PICC line and refused RHC/LHC.  Concern for low output HF with AKI/creatinine up to 2.  Patient was started on milrinone empirically with good diuresis,  creatinine down to about 1.7. Echo this admission showed EF 10-15%, global HK, severe LV dilation, severely enlarged RV with severely decreased systolic function, severe biatrial enlargement, moderate MR, dilated IVC.   Follow up 5/23, out of all meds except Bidil x 5 days and was significantly volume overloaded. Arranged for outpatient RHC/LHC, and admitted with elevated filling pressures and low output, CI 1.53. RCA was chronically occluded but he did not have obstructive disease on left. Started on IV lasix and milrinone. Able to wean drips and titrate GDMT. Discharged to a friend's house, weight 248 lbs.  Follow up 7/23, NYHA III and volume overloaded on exam. Metolazone 2.5/40 KCL added weekly to diuretic regimen. Followed by paramedicine.  Today he returns for HF follow up with [name], paramedic. States that he is feeling more fatigued with some lightheadedness and dizziness. SOB walking on flat ground and legs are swelling. Drinks 3 40 oz beers a week. Denies palpitations. Rare atypical chest pressure. Main complaint is on-going vomiting a few times a day x 1 month.  Has been off torsemide, metolazone and KCL since vomiting started. No fever or chills. No further crack cocaine use, smokes THC daily and appears under the influence during visit. Weight up 11 lbs.  ECG (personally reviewed): NSR with PACs, QTc 518 msec  ReDs: 35%  Labs (9/22): K 3.9, creatinine 1.67 Labs (4/23): K 3.6, creatinine 1.69, hgb 12.8, LDL 87 Labs (5/23): K 3.7, creatinine 1.85 Labs (6/23): K 4.0, creatinine 1.45 Labs (9/23): K 3.5, creatinine 1.59, GFR 50  Review of Systems:  All systems reviewed and negative except as per HPI.   PMH: 1. ETOH abuse.  2. Cocaine abuse: Has been off cocaine x months.  3. CAD: Cath in 7/20 with totally occluded RCA with collaterals.  - NSTEMI in 7/22 with HS-TnI > 1000 but no cath.  4. CKD stage 3 5. HTN 6. Hyperlipidemia 7. Chronic systolic CHF: Suspect mixed  ischemic/nonischemic cardiomyopathy.  See cath report as above from 7/20.  He continues to drink ETOH heavily, used cocaine in the past.  Both could contribute.  - Echo (4/23): EF 10-15%, global HK, severe LV dilation, severely enlarged RV with severely decreased systolic function, severe biatrial enlargement, moderate MR, dilated IVC - R/LHC (5/23): occluded pRCA w/ collaterals, nonobstructive disease in the left system, markedly elevated filling pressures,  pulmonary venous hypertension, low CO. RA mean 23, RV 50/21, PA 53/32, mean 40, PCWP mean 39, LV 117/35, AO 117/82, CO/CI (Fick): 3.5/1.53, PVR < 1 WU, Oxygen saturations: PA 47%, AO 97%  ROS: All systems reviewed and negative except as per HPI.   Current Outpatient Medications  Medication Sig Dispense Refill   aspirin 81 MG EC tablet Take 1 tablet (81 mg total) by mouth daily. Swallow whole. 30 tablet 2   atorvastatin (LIPITOR) 40 MG tablet TAKE ONE TABLET BY MOUTH ONCE DAILY FOR CHOLESTEROL 90 tablet 3   digoxin (LANOXIN) 0.125 MG tablet TAKE ONE TABLET BY MOUTH ONCE DAILY 90 tablet 3   FARXIGA 10 MG TABS tablet TAKE ONE TABLET BY MOUTH ONCE DAILY 90 tablet 3   pantoprazole (PROTONIX) 40 MG tablet Take 1 tablet (40 mg total) by mouth daily. Take 1 tablet daily for 14 days 14 tablet 0   potassium chloride SA (KLOR-CON M) 20 MEQ tablet Take 2 tablets (40 mEq total) by mouth daily. 180 tablet 3   torsemide (DEMADEX) 20 MG tablet Take 2 tablets (40 mg total) by mouth daily. 180 tablet 3   No current facility-administered medications for this encounter.   Allergies  Allergen Reactions   Hydrocodone Itching   Social History   Socioeconomic History   Marital status: Single    Spouse name: none   Number of children: Not on file   Years of education: Not on file   Highest education level: Not on file  Occupational History   Occupation: Disabled  Tobacco Use   Smoking status: Never   Smokeless tobacco: Never  Vaping Use   Vaping  Use: Never used  Substance and Sexual Activity   Alcohol use: Yes    Comment: 1 quarts a week   Drug use: Not Currently    Frequency: 3.0 times per week    Types: Marijuana, Cocaine   Sexual activity: Not Currently  Other Topics Concern   Not on file  Social History Narrative   Lives w/ mother in [place] but recently visiting w/ dtr in [place].   Social Determinants of Health   Financial Resource Strain: High Risk (02/20/2022)   Overall Financial Resource Strain (CARDIA)    Difficulty of Paying Living Expenses: Hard  Food Insecurity: Food Insecurity Present (04/19/2022)   Hunger Vital Sign    Worried About Running Out of Food in the Last Year: Sometimes true    Ran Out of Food in the Last Year: Sometimes true  Transportation Needs: Unmet Transportation Needs (05/03/2022)   PRAPARE - Hydrologist (Medical): Yes    Lack of Transportation (Non-Medical): Yes  Physical Activity: Not on file  Stress:  Not on file  Social Connections: Not on file  Intimate Partner Violence: Not on file   Family History  Problem Relation Age of Onset   Hypertension Maternal Grandmother    BP 90/66   Pulse 77   Wt 114.3 kg (252 lb)   SpO2 97%   BMI 35.15 kg/m   Wt Readings from Last 3 Encounters:  08/30/22 114.3 kg (252 lb)  08/30/22 114.3 kg (252 lb)  08/27/22 109.3 kg (241 lb)   PHYSICAL EXAM: General:  NAD. No resp difficulty, walked into clinic HEENT: Normal Neck: Supple. JVP to jaw Carotids 2+ bilat; no bruits. No lymphadenopathy or thryomegaly appreciated. Cor: PMI nondisplaced. Regular rate & rhythm. No rubs, gallops, 2/6 SEM LUSB. Lungs: Clear Abdomen: Soft, nontender, nondistended. No hepatosplenomegaly. No bruits or masses. Good bowel sounds. Extremities: No cyanosis, clubbing, rash, 2+ BLE edema to knees Neuro: Alert & oriented x 3, cranial nerves grossly intact. Moves all 4 extremities w/o difficulty. Affect pleasant. Appears intoxicated  ASSESSMENT &  PLAN: 1. 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 He has a history of alcohol and cocaine abuse, which likely contributes to cardiomyopathy.  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 Most recent echo in 4/23 showed EF 10-15%, global HK, severe LV dilation, severely enlarged RV with severely decreased systolic function, severe biatrial enlargement, moderate MR, dilated IVC. LHC/RHC this admit showed low CI 1.53 and markedly high filling pressures, RCA was chronically occluded but he did not have obstructive disease on left. NYHA III, he is volume overloaded on exam although REDs 35%. He drinks lot of fluids and ETOH. Discloses eating processed meats. - Restart torsemide 40 mg daily (previously on 80 bid) + restart 40 KCL daily. BMET/BNP today, repeat BMET in 10 days. - Continue digoxin 0.125 mg daily. Check dig level. - Continue Farxiga 10 mg daily. - No BP room to add Entresto or spiro. - Off BiDil with low BP. - Not candidate for ICD unless he can cut back on ETOH abuse.  He has stopped cocaine. Narrow QRS so no CRT.  - Low output HF but with substance abuse, poor compliance, and living situation (basically homeless, lives with friends or in hotels), he is not a candidate for advanced therapies.  Would also be poor home milrinone candidate.   2. CAD: LHC in 7/20 and 5/23 showed CTO of RCA treated medically, left system ok. Suspect cardiomyopathy is primarily nonischemic. No chest pain. - Continue ASA 81 daily + atorvastatin 40 mg daily.  3. Cocaine Use Disorder: Has been off cocaine for several years.  4. Alcohol Use Disorder: Advised complete cessation. 5. CKD stage 3: Labs today. 6. ? PAD: Continues to have discomfort in feet when walking. - Arterial dopplers with normal ABIs. 7. SDOH: uses public transportation. Establishing with PCP in a couple months. - He is homeless, staying with a friend.   - Continue paramedicine, appreciate their assistance. 8.  Nausea/Vomiting: On-going for several weeks. No fever, chills or obvious infectious source. Has 2 BMs/week, this is his usual habit. Smokes THC daily and drinks ETOH frequently. Appetite OK. - Start pantoprazole 40 mg daily x 2 weeks. - Refer to GI.  - Check CMET and lipase today.   Follow up in 4 weeks with APP and 3-4 months with Dr. [name].  Prince Rome, FNP-BC 08/30/22

## 2022-08-30 NOTE — Progress Notes (Signed)
ReDS Vest / Clip - 08/30/22 1400       ReDS Vest / Clip   Station Marker C    Ruler Value 30    ReDS Value Range Low volume    ReDS Actual Value 35

## 2022-09-10 ENCOUNTER — Telehealth (HOSPITAL_COMMUNITY): Payer: Self-pay | Admitting: Pharmacy Technician

## 2022-09-10 ENCOUNTER — Other Ambulatory Visit (HOSPITAL_COMMUNITY): Payer: Self-pay

## 2022-09-10 ENCOUNTER — Ambulatory Visit (HOSPITAL_COMMUNITY)
Admission: RE | Admit: 2022-09-10 | Discharge: 2022-09-10 | Disposition: A | Discharge status: Home / Self Care | Payer: Medicaid Other | Source: Ambulatory Visit | Attending: Internal Medicine | Admitting: Internal Medicine

## 2022-09-10 ENCOUNTER — Other Ambulatory Visit (HOSPITAL_COMMUNITY): Payer: Self-pay | Admitting: Emergency Medicine

## 2022-09-10 DIAGNOSIS — I5023 Acute on chronic systolic (congestive) heart failure: Secondary | ICD-10-CM | POA: Diagnosis present

## 2022-09-10 DIAGNOSIS — I5022 Chronic systolic (congestive) heart failure: Secondary | ICD-10-CM

## 2022-09-10 LAB — BASIC METABOLIC PANEL
Anion gap: 9 (ref 5–15)
BUN: 22 mg/dL — ABNORMAL HIGH (ref 6–20)
CO2: 27 mmol/L (ref 22–32)
Calcium: 8.5 mg/dL — ABNORMAL LOW (ref 8.9–10.3)
Chloride: 103 mmol/L (ref 98–111)
Creatinine, Ser: 1.25 mg/dL — ABNORMAL HIGH (ref 0.61–1.24)
GFR, Estimated: >60 mL/min (ref >60)
Glucose, Bld: 94 mg/dL (ref 70–99)
Potassium: 3.9 mmol/L (ref 3.5–5.1)
Sodium: 139 mmol/L (ref 135–145)

## 2022-09-10 NOTE — Telephone Encounter (Signed)
Patient Advocate Encounter   Received notification from Medicaid that prior authorization for [place] is required.   PA submitted on NCTracks Confirmation [id number] W Status is pending   Will continue to follow.

## 2022-09-10 NOTE — Progress Notes (Signed)
Paramedicine Encounter    Patient ID: Dale [name], male    DOB: 1961-09-16, 61 y.o.   MRN: [id number]   BP 100/60 (BP Location: Right Arm, Patient Position: Sitting, Cuff Size: Normal)   Pulse 68   Resp 14   Wt 251 lb 9.6 oz (114.1 kg)   SpO2 95%   BMI 35.09 kg/m  Weight yesterday-not taken Last visit weight-252lb  ATF Mr. Symmes A&O x 4, skin W&D w/ good color.  He denies chest pain or SOB.  He does have some edema still in his lower extremities.  Lung sounds clear and equal bilat.  He admits to smoking pot today and has been drinking [employer].  I encouraged him to make better choices.  He not 100% compliant with his meds but is only missing 1 or 2 doses out of a week.  Med box reconciled x 1 week.  Refills called in for: Protonix, Farxiga, Digoxin.  Pharm advises [name] needs approval.  Sent message to HF Clinic Triage to request same.  Approval is pending per [facility]. Home visit complete.    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) 06/13/2021   CHF (congestive heart failure) (HCC) 06/13/2021   Acute on chronic systolic CHF (congestive heart failure), NYHA class 4 (HCC)    CKD (chronic kidney disease), stage III (HCC) 06/01/2021   Hypokalemia    Acute on chronic HFrEF (heart failure with reduced ejection fraction) (HCC) 12/25/2020   Coronary artery disease involving native coronary artery of native heart  without angina pectoris    Acute kidney injury superimposed on chronic kidney disease (HCC) 10/16/2020   Acute on chronic combined systolic and diastolic HF (heart failure) (HCC) 09/08/2020   Acute on chronic combined systolic (congestive) and diastolic (congestive) heart failure (HCC)    Acute CHF (congestive heart failure) (HCC) 05/07/2019   Nausea & vomiting 05/07/2019   Acute exacerbation of CHF (congestive heart failure) (HCC) 03/11/2019   Troponin level elevated 03/11/2019   Hyperlipidemia 03/03/2019   NSVT (nonsustained ventricular tachycardia) (HCC) 03/03/2019   Anxiety and depression    NSTEMI (non-ST elevated myocardial infarction) (HCC) 02/28/2019   Acute systolic heart failure (HCC) 11/08/2018   Hypertensive urgency 09/28/2018   Acute pulmonary edema (HCC) 09/28/2018   Cocaine abuse (HCC) 09/28/2018   Substance abuse (HCC) 09/28/2018   Chronic kidney disease, stage II (mild) 09/28/2018   Normochromic anemia 09/28/2018   Acute systolic CHF (congestive heart failure) (HCC) 09/27/2018   Essential hypertension 03/11/2016   Obesity (BMI 30-39.9) 03/11/2016   Glucosuria 03/11/2016    Current Outpatient Medications:    aspirin 81 MG EC tablet, Take 1 tablet (81 mg total) by mouth daily. Swallow whole., Disp: 30 tablet, Rfl: 2   atorvastatin (LIPITOR) 40 MG tablet, TAKE ONE TABLET BY MOUTH ONCE DAILY FOR CHOLESTEROL, Disp: 90 tablet, Rfl: 3   digoxin (LANOXIN) 0.125 MG tablet, TAKE ONE TABLET BY MOUTH ONCE DAILY, Disp: 90 tablet, Rfl:  3   FARXIGA 10 MG TABS tablet, TAKE ONE TABLET BY MOUTH ONCE DAILY, Disp: 90 tablet, Rfl: 3   pantoprazole (PROTONIX) 40 MG tablet, Take 1 tablet (40 mg total) by mouth daily. Take 1 tablet daily for 14 days, Disp: 14 tablet, Rfl: 0   potassium chloride SA (KLOR-CON M) 20 MEQ tablet, Take 2 tablets (40 mEq total) by mouth daily., Disp: 180 tablet, Rfl: 3   torsemide (DEMADEX) 20 MG tablet, Take 2 tablets (40 mg total) by mouth daily., Disp: 180  tablet, Rfl: 3 Allergies  Allergen Reactions   Hydrocodone Itching      Social History   Socioeconomic History   Marital status: Single    Spouse name: none   Number of children: Not on file   Years of education: Not on file   Highest education level: Not on file  Occupational History   Occupation: Disabled  Tobacco Use   Smoking status: Never   Smokeless tobacco: Never  Vaping Use   Vaping Use: Never used  Substance and Sexual Activity   Alcohol use: Yes    Comment: 1 quarts a week   Drug use: Not Currently    Frequency: 3.0 times per week    Types: Marijuana, Cocaine   Sexual activity: Not Currently  Other Topics Concern   Not on file  Social History Narrative   Lives w/ mother in [place] but recently visiting w/ dtr in [place].   Social Determinants of Health   Financial Resource Strain: High Risk (02/20/2022)   Overall Financial Resource Strain (CARDIA)    Difficulty of Paying Living Expenses: Hard  Food Insecurity: Food Insecurity Present (04/19/2022)   Hunger Vital Sign    Worried About Running Out of Food in the Last Year: Sometimes true    Ran Out of Food in the Last Year: Sometimes true  Transportation Needs: Unmet Transportation Needs (05/03/2022)   PRAPARE - Administrator, Civil Service (Medical): Yes    Lack of Transportation (Non-Medical): Yes  Physical Activity: Not on file  Stress: Not on file  Social Connections: Not on file  Intimate Partner Violence: Not on file    Physical Exam      Future Appointments  Date Time Provider Department Center  09/30/2022 10:30 AM [facility] PA/NP [facility] None  10/09/2022  9:20 AM Ivonne Andrew, NP [facility] None       [name], EMT-Paramedic [phone] [facility] Paramedic  09/10/22

## 2022-09-18 NOTE — Telephone Encounter (Signed)
Advanced Heart Failure Patient Advocate Encounter  Prior Authorization for [name] has been approved.    PA# [id number] Effective dates: 09/10/22 through 09/10/23  Archer Asa, CPhT

## 2022-09-20 ENCOUNTER — Other Ambulatory Visit (HOSPITAL_COMMUNITY): Payer: Self-pay | Admitting: Emergency Medicine

## 2022-09-20 NOTE — Progress Notes (Signed)
Paramedicine Encounter    Patient ID: Dale [name], male    DOB: 06-04-1961, 61 y.o.   MRN: [id number]   There were no vitals taken for this visit. Weight yesterday-not taken Last visit weight-251.9lb  C bi .  Patient Care Team: Laurey Morale, MD as PCP - General (Cardiology)  Patient Active Problem List   Diagnosis Date Noted  . Acute on chronic systolic (congestive) heart failure (HCC) 03/18/2022  . Acute on chronic systolic CHF (congestive heart failure) (HCC) 03/18/2022  . Alcohol use disorder, moderate, in early remission (HCC) 07/13/2021  . Adjustment disorder with mixed anxiety and depressed mood   . Palliative care by specialist   . Prolonged QT interval 06/14/2021  . 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 CHF (congestive heart failure) (HCC) 06/13/2021  . Acute on chronic systolic CHF (congestive heart failure), NYHA class 4 (HCC)   . CKD (chronic kidney disease), stage III (HCC) 06/01/2021  . Hypokalemia   . Acute on chronic HFrEF (heart failure with reduced ejection fraction) (HCC) 12/25/2020  . Coronary artery disease involving native coronary artery of native heart without angina pectoris   . Acute kidney injury superimposed on chronic kidney disease (HCC) 10/16/2020  . 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 Acute on chronic combined systolic (congestive) and diastolic (congestive) heart failure (HCC)   . Acute CHF (congestive heart failure) (HCC) 05/07/2019  . Nausea & vomiting 05/07/2019  . Acute exacerbation of CHF (congestive heart failure) (HCC) 03/11/2019  . Troponin level elevated 03/11/2019  . Hyperlipidemia 03/03/2019  . NSVT (nonsustained ventricular tachycardia) (HCC) 03/03/2019  . Anxiety and depression   . NSTEMI (non-ST elevated myocardial infarction) (HCC) 02/28/2019  . Acute systolic heart failure (HCC) 11/08/2018  . Hypertensive urgency  09/28/2018  . Acute pulmonary edema (HCC) 09/28/2018  . Cocaine abuse (HCC) 09/28/2018  . Substance abuse (HCC) 09/28/2018  . Chronic kidney disease, stage II (mild) 09/28/2018  . Normochromic anemia 09/28/2018  . Acute systolic CHF (congestive heart failure) (HCC) 09/27/2018  . Essential hypertension 03/11/2016  . Obesity (BMI 30-39.9) 03/11/2016  . Glucosuria 03/11/2016    Current Outpatient Medications:  .  aspirin 81 MG EC tablet, Take 1 tablet (81 mg total) by mouth daily. Swallow whole., Disp: 30 tablet, Rfl: 2 .  atorvastatin (LIPITOR) 40 MG tablet, TAKE ONE TABLET BY MOUTH ONCE DAILY FOR CHOLESTEROL, Disp: 90 tablet, Rfl: 3 .  digoxin (LANOXIN) 0.125 MG tablet, TAKE ONE TABLET BY MOUTH ONCE DAILY, Disp: 90 tablet, Rfl: 3 .  FARXIGA 10 MG TABS tablet, TAKE ONE TABLET BY MOUTH ONCE DAILY, Disp: 90 tablet, Rfl: 3 .  pantoprazole (PROTONIX) 40 MG tablet, Take 1 tablet (40 mg total) by mouth daily. Take 1 tablet daily for 14 days, Disp: 14 tablet, Rfl: 0 .  potassium chloride SA (KLOR-CON M) 20 MEQ tablet, Take 2 tablets (40 mEq total) by mouth daily., Disp: 180 tablet, Rfl: 3 .  torsemide (DEMADEX) 20 MG tablet, Take 2 tablets (40 mg total) by mouth daily., Disp: 180 tablet, Rfl: 3 Allergies  Allergen Reactions  . Hydrocodone Itching      Social History   Socioeconomic History  . Marital status: Single    Spouse name: none  . Number of children: Not on file  . Years of education: Not on file  . Highest education level: Not on file  Occupational History  . Occupation: Disabled  Tobacco Use  . Smoking status: Never  . Smokeless tobacco: Never  Vaping Use  . Vaping Use: Never used  Substance and Sexual Activity  . Alcohol use: Yes    Comment: 1 quarts a week  . Drug use: Not Currently    Frequency: 3.0 times per week    Types: Marijuana, Cocaine  . Sexual activity: Not Currently  Other Topics Concern  . Not on file  Social History Narrative   Lives w/ mother in  [place] but recently visiting w/ dtr in [place].   Social Determinants of Health   Financial Resource Strain: High Risk (02/20/2022)   Overall Financial Resource Strain (CARDIA)   . Difficulty of Paying Living Expenses: Hard  Food Insecurity: Food Insecurity Present (04/19/2022)   Hunger Vital Sign   . Worried About Programme researcher, broadcasting/film/video in the Last Year: Sometimes true   . Ran Out of Food in the Last Year: Sometimes true  Transportation Needs: Unmet Transportation Needs (05/03/2022)   PRAPARE - Transportation   . Lack of Transportation (Medical): Yes   . Lack of Transportation (Non-Medical): Yes  Physical Activity: Not on file  Stress: Not on file  Social Connections: Not on file  Intimate Partner Violence: Not on file    Physical Exam      Future Appointments  Date Time Provider Department Center  09/30/2022 10:30 AM [facility] PA/NP [facility] None  10/09/2022  9:20 AM Ivonne Andrew, NP [facility] None       [name], EMT-P-Paramedic [phone] [facility] Paramedic  09/20/22

## 2022-09-25 ENCOUNTER — Telehealth (HOSPITAL_COMMUNITY): Payer: Self-pay

## 2022-09-25 NOTE — Telephone Encounter (Signed)
Called and left patient a voice message to confirm/remind patient of their appointment at the [facility] on 09/30/22.

## 2022-09-30 ENCOUNTER — Encounter (HOSPITAL_COMMUNITY): Payer: Medicaid Other

## 2022-09-30 NOTE — Progress Notes (Incomplete)
ADVANCED HF CLINIC NOTE  PCP: None HF Cardiologist: Dr. [name]  HPI: Dale [name] is a 61 y.o. male  with a PMH significant for hypertension, polysubstance use (including cocaine & alcohol), chronic combined CHF, and CAD.   He was admitted 11/19 with signs/symptoms of CHF, in the setting of poorly controlled hypertension alcohol and cocaine use. He was started on IV Lasix and echo showed EF 20%, G2 DD.  He left AMA.   Readmitted 12/19 with CHF exacerbation, started on IV diuretics, beta-blocker, ARB and BiDil.  Discharged on oral torsemide.  He remained positive for cocaine on UDS.   Admitted 4/20 with chest pain and CHF. Diagnosed with NSTEMI. He declined cardiac catheterization.     Admitted 5/20 with CHF and chest pain, still using cocaine, but no evidence of ischemia.  Diuresed and discharged.  Echo (5/20) EF 20 to 25%, elevated LVEDP, diffuse hypokinesis with worse hypokinesis and inferior wall moderately enlarged RV with normal function.  Admitted in 7/20 with acute CHF and underwent L/RHC showing severe single vessel CAD with CTO of pRCA, mild to moderate non-obs CAD involving LAD and LCx, moderately elevated left heart filling pressures, severely elevated right heart filling pressures, mild pulmonary hypertension and normal CO/CI by Fick. He was treated medically.  2 CHF admissions 2021. 6 admissions in 2022.    Admitted 7/22 with NSTEMI and troponin > 1000,  but treated medically due to continued cocaine use and concern for poor medication adherence if stent was needed.   Admitted 8/22 with CHF exacerbation.  Echo with EF <10%, medications restarted. Farxiga and spiro added.    He was seen in [facility] 9/22, doing well from CHF standpoint and compliant with medication regimen.   Admitted again in 4/23 with CHF, refused PICC line and refused RHC/LHC.  Concern for low output HF with AKI/creatinine up to 2.  Patient was started on milrinone empirically with good diuresis,  creatinine down to about 1.7. Echo this admission showed EF 10-15%, global HK, severe LV dilation, severely enlarged RV with severely decreased systolic function, severe biatrial enlargement, moderate MR, dilated IVC.   Follow up 5/23, out of all meds except Bidil x 5 days and was significantly volume overloaded. Arranged for outpatient RHC/LHC, and admitted with elevated filling pressures and low output, CI 1.53. RCA was chronically occluded but he did not have obstructive disease on left. Started on IV lasix and milrinone. Able to wean drips and titrate GDMT. Discharged to a friend's house, weight 248 lbs.  Follow up 7/23, NYHA III and volume overloaded on exam. Metolazone 2.5/40 KCL added weekly to diuretic regimen. Followed by paramedicine.  Today he returns for HF follow up with [name], paramedic. States that he is feeling more fatigued with some lightheadedness and dizziness. SOB walking on flat ground and legs are swelling. Drinks 3 40 oz beers a week. Denies palpitations. Rare atypical chest pressure. Main complaint is on-going vomiting a few times a day x 1 month.  Has been off torsemide, metolazone and KCL since vomiting started. No fever or chills. No further crack cocaine use, smokes THC daily and appears under the influence during visit. Weight up 11 lbs.  ECG (personally reviewed): NSR with PACs, QTc 518 msec  ReDs: 35%  Labs (9/22): K 3.9, creatinine 1.67 Labs (4/23): K 3.6, creatinine 1.69, hgb 12.8, LDL 87 Labs (5/23): K 3.7, creatinine 1.85 Labs (6/23): K 4.0, creatinine 1.45 Labs (9/23): K 3.5, creatinine 1.59, GFR 50  Review of Systems:  All systems reviewed and negative except as per HPI.   PMH: 1. ETOH abuse.  2. Cocaine abuse: Has been off cocaine x months.  3. CAD: Cath in 7/20 with totally occluded RCA with collaterals.  - NSTEMI in 7/22 with HS-TnI > 1000 but no cath.  4. CKD stage 3 5. HTN 6. Hyperlipidemia 7. Chronic systolic CHF: Suspect mixed  ischemic/nonischemic cardiomyopathy.  See cath report as above from 7/20.  He continues to drink ETOH heavily, used cocaine in the past.  Both could contribute.  - Echo (4/23): EF 10-15%, global HK, severe LV dilation, severely enlarged RV with severely decreased systolic function, severe biatrial enlargement, moderate MR, dilated IVC - R/LHC (5/23): occluded pRCA w/ collaterals, nonobstructive disease in the left system, markedly elevated filling pressures,  pulmonary venous hypertension, low CO. RA mean 23, RV 50/21, PA 53/32, mean 40, PCWP mean 39, LV 117/35, AO 117/82, CO/CI (Fick): 3.5/1.53, PVR < 1 WU, Oxygen saturations: PA 47%, AO 97%  ROS: All systems reviewed and negative except as per HPI.   Current Outpatient Medications  Medication Sig Dispense Refill   aspirin 81 MG EC tablet Take 1 tablet (81 mg total) by mouth daily. Swallow whole. 30 tablet 2   atorvastatin (LIPITOR) 40 MG tablet TAKE ONE TABLET BY MOUTH ONCE DAILY FOR CHOLESTEROL 90 tablet 3   digoxin (LANOXIN) 0.125 MG tablet TAKE ONE TABLET BY MOUTH ONCE DAILY 90 tablet 3   FARXIGA 10 MG TABS tablet TAKE ONE TABLET BY MOUTH ONCE DAILY 90 tablet 3   pantoprazole (PROTONIX) 40 MG tablet Take 1 tablet (40 mg total) by mouth daily. Take 1 tablet daily for 14 days 14 tablet 0   potassium chloride SA (KLOR-CON M) 20 MEQ tablet Take 2 tablets (40 mEq total) by mouth daily. 180 tablet 3   torsemide (DEMADEX) 20 MG tablet Take 2 tablets (40 mg total) by mouth daily. 180 tablet 3   No current facility-administered medications for this visit.   Allergies  Allergen Reactions   Hydrocodone Itching   Social History   Socioeconomic History   Marital status: Single    Spouse name: none   Number of children: Not on file   Years of education: Not on file   Highest education level: Not on file  Occupational History   Occupation: Disabled  Tobacco Use   Smoking status: Never   Smokeless tobacco: Never  Vaping Use   Vaping Use:  Never used  Substance and Sexual Activity   Alcohol use: Yes    Comment: 1 quarts a week   Drug use: Not Currently    Frequency: 3.0 times per week    Types: Marijuana, Cocaine   Sexual activity: Not Currently  Other Topics Concern   Not on file  Social History Narrative   Lives w/ mother in [place] but recently visiting w/ dtr in [place].   Social Determinants of Health   Financial Resource Strain: High Risk (02/20/2022)   Overall Financial Resource Strain (CARDIA)    Difficulty of Paying Living Expenses: Hard  Food Insecurity: Food Insecurity Present (04/19/2022)   Hunger Vital Sign    Worried About Running Out of Food in the Last Year: Sometimes true    Ran Out of Food in the Last Year: Sometimes true  Transportation Needs: Unmet Transportation Needs (05/03/2022)   PRAPARE - Administrator, Civil Service (Medical): Yes    Lack of Transportation (Non-Medical): Yes  Physical Activity: Not on file  Stress:  Not on file  Social Connections: Not on file  Intimate Partner Violence: Not on file   Family History  Problem Relation Age of Onset   Hypertension Maternal Grandmother    There were no vitals taken for this visit.  Wt Readings from Last 3 Encounters:  09/20/22 112.4 kg (247 lb 14.4 oz)  09/10/22 114.1 kg (251 lb 9.6 oz)  08/30/22 114.3 kg (252 lb)   PHYSICAL EXAM: General:  NAD. No resp difficulty, walked into clinic HEENT: Normal Neck: Supple. JVP to jaw Carotids 2+ bilat; no bruits. No lymphadenopathy or thryomegaly appreciated. Cor: PMI nondisplaced. Regular rate & rhythm. No rubs, gallops, 2/6 SEM LUSB. Lungs: Clear Abdomen: Soft, nontender, nondistended. No hepatosplenomegaly. No bruits or masses. Good bowel sounds. Extremities: No cyanosis, clubbing, rash, 2+ BLE edema to knees Neuro: Alert & oriented x 3, cranial nerves grossly intact. Moves all 4 extremities w/o difficulty. Affect pleasant. Appears intoxicated  ASSESSMENT & PLAN: 1. 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 He has a history of alcohol and cocaine abuse, which likely contributes to cardiomyopathy.  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 Most recent echo in 4/23 showed EF 10-15%, global HK, severe LV dilation, severely enlarged RV with severely decreased systolic function, severe biatrial enlargement, moderate MR, dilated IVC. LHC/RHC this admit showed low CI 1.53 and markedly high filling pressures, RCA was chronically occluded but he did not have obstructive disease on left. NYHA III, he is volume overloaded on exam although REDs 35%. He drinks lot of fluids and ETOH. Discloses eating processed meats. - Restart torsemide 40 mg daily (previously on 80 bid) + restart 40 KCL daily. BMET/BNP today, repeat BMET in 10 days. - Continue digoxin 0.125 mg daily. Check dig level. - Continue Farxiga 10 mg daily. - No BP room to add Entresto or spiro. - Off BiDil with low BP. - Not candidate for ICD unless he can cut back on ETOH abuse.  He has stopped cocaine. Narrow QRS so no CRT.  - Low output HF but with substance abuse, poor compliance, and living situation (basically homeless, lives with friends or in hotels), he is not a candidate for advanced therapies.  Would also be poor home milrinone candidate.   2. CAD: LHC in 7/20 and 5/23 showed CTO of RCA treated medically, left system ok. Suspect cardiomyopathy is primarily nonischemic. No chest pain. - Continue ASA 81 daily + atorvastatin 40 mg daily.  3. Cocaine Use Disorder: Has been off cocaine for several years.  4. Alcohol Use Disorder: Advised complete cessation. 5. CKD stage 3: Labs today. 6. ? PAD: Continues to have discomfort in feet when walking. - Arterial dopplers with normal ABIs. 7. SDOH: uses public transportation. Establishing with PCP in a couple months. - He is homeless, staying with a friend.   - Continue paramedicine, appreciate their assistance. 8. Nausea/Vomiting: On-going  for several weeks. No fever, chills or obvious infectious source. Has 2 BMs/week, this is his usual habit. Smokes THC daily and drinks ETOH frequently. Appetite OK. - Start pantoprazole 40 mg daily x 2 weeks. - Refer to GI.  - Check CMET and lipase today.   Follow up in 4 weeks with APP and 3-4 months with Dr. [name].  Prince Rome, FNP-BC 09/30/22

## 2022-10-09 ENCOUNTER — Other Ambulatory Visit (HOSPITAL_COMMUNITY): Payer: Self-pay | Admitting: Emergency Medicine

## 2022-10-09 ENCOUNTER — Ambulatory Visit: Payer: Medicaid Other | Admitting: Nurse Practitioner

## 2022-10-09 NOTE — Progress Notes (Signed)
Paramedicine Encounter    Patient ID: Dale [name], male    DOB: Jul 01, 1961, 61 y.o.   MRN: [id number]   BP (!) 130/100 (BP Location: Right Arm, Patient Position: Sitting, Cuff Size: Normal)   Pulse 93   Resp 18   Wt 260 lb (117.9 kg)   SpO2 98%   BMI 36.26 kg/m  Weight yesterday-not taken Last visit weight-247lb   Stopped by for an unscheduled visit with [name].  I picked up [name]'s refills from [facility] yesterday and wanted to drop them by to him.  Med box reconciled for 2 weeks.   Today [name] complains of flu-like symptoms: body aches, runny nose.  He denies fever.  No chest pain or SOB.  He does have significant edema to his extremities, even is face looks puffy.  He is up 13lbs.  He admits to poor med compliance and poor nutritional choices.  Reached out to  HF Triage for possible med changes.  Med changes were text to me.  I reached out to [name] to request to drop by and he refused saying, "Now's not a good time.  [name] is in one of her moods."  I text him with instructions on what to add to his meds:   "To your exisiting pillbox add 2 Torsemide in the PM slots and 1 Potassium to the PM slots."  I will reach out to Mr. Thaw again in the morning to follow up.  Visit Complete.    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) 06/13/2021   CHF (congestive heart failure) (HCC) 06/13/2021   Acute on chronic  systolic CHF (congestive heart failure), NYHA class 4 (HCC)    CKD (chronic kidney disease), stage III (HCC) 06/01/2021   Hypokalemia    Acute on chronic HFrEF (heart failure with reduced ejection fraction) (HCC) 12/25/2020   Coronary artery disease involving native coronary artery of native heart without angina pectoris    Acute kidney injury superimposed on chronic kidney disease (HCC) 10/16/2020   Acute on chronic combined systolic and diastolic HF (heart failure) (HCC) 09/08/2020   Acute on chronic combined systolic (congestive) and diastolic (congestive) heart failure (HCC)    Acute CHF (congestive heart failure) (HCC) 05/07/2019   Nausea & vomiting 05/07/2019   Acute exacerbation of CHF (congestive heart failure) (HCC) 03/11/2019   Troponin level elevated 03/11/2019   Hyperlipidemia 03/03/2019   NSVT (nonsustained ventricular tachycardia) (HCC) 03/03/2019   Anxiety and depression    NSTEMI (non-ST elevated myocardial infarction) (HCC) 02/28/2019   Acute systolic heart failure (HCC) 11/08/2018   Hypertensive urgency 09/28/2018   Acute pulmonary edema (HCC) 09/28/2018   Cocaine abuse (HCC) 09/28/2018   Substance abuse (HCC) 09/28/2018   Chronic kidney disease, stage II (mild) 09/28/2018   Normochromic anemia 09/28/2018   Acute systolic CHF (congestive heart failure) (HCC) 09/27/2018   Essential hypertension 03/11/2016   Obesity (BMI 30-39.9) 03/11/2016   Glucosuria 03/11/2016  Current Outpatient Medications:    aspirin 81 MG EC tablet, Take 1 tablet (81 mg total) by mouth daily. Swallow whole., Disp: 30 tablet, Rfl: 2   atorvastatin (LIPITOR) 40 MG tablet, TAKE ONE TABLET BY MOUTH ONCE DAILY FOR CHOLESTEROL, Disp: 90 tablet, Rfl: 3   digoxin (LANOXIN) 0.125 MG tablet, TAKE ONE TABLET BY MOUTH ONCE DAILY, Disp: 90 tablet, Rfl: 3   FARXIGA 10 MG TABS tablet, TAKE ONE TABLET BY MOUTH ONCE DAILY, Disp: 90 tablet, Rfl: 3   potassium chloride SA (KLOR-CON M) 20 MEQ tablet, Take 2  tablets (40 mEq total) by mouth daily., Disp: 180 tablet, Rfl: 3   torsemide (DEMADEX) 20 MG tablet, Take 2 tablets (40 mg total) by mouth daily., Disp: 180 tablet, Rfl: 3   pantoprazole (PROTONIX) 40 MG tablet, Take 1 tablet (40 mg total) by mouth daily. Take 1 tablet daily for 14 days (Patient not taking: Reported on 10/09/2022), Disp: 14 tablet, Rfl: 0 Allergies  Allergen Reactions   Hydrocodone Itching      Social History   Socioeconomic History   Marital status: Single    Spouse name: none   Number of children: Not on file   Years of education: Not on file   Highest education level: Not on file  Occupational History   Occupation: Disabled  Tobacco Use   Smoking status: Never   Smokeless tobacco: Never  Vaping Use   Vaping Use: Never used  Substance and Sexual Activity   Alcohol use: Yes    Comment: 1 quarts a week   Drug use: Not Currently    Frequency: 3.0 times per week    Types: Marijuana, Cocaine   Sexual activity: Not Currently  Other Topics Concern   Not on file  Social History Narrative   Lives w/ mother in [place] but recently visiting w/ dtr in [place].   Social Determinants of Health   Financial Resource Strain: High Risk (02/20/2022)   Overall Financial Resource Strain (CARDIA)    Difficulty of Paying Living Expenses: Hard  Food Insecurity: Food Insecurity Present (04/19/2022)   Hunger Vital Sign    Worried About Running Out of Food in the Last Year: Sometimes true    Ran Out of Food in the Last Year: Sometimes true  Transportation Needs: Unmet Transportation Needs (05/03/2022)   PRAPARE - Administrator, Civil Service (Medical): Yes    Lack of Transportation (Non-Medical): Yes  Physical Activity: Not on file  Stress: Not on file  Social Connections: Not on file  Intimate Partner Violence: Not on file    Physical Exam      Future Appointments  Date Time Provider Department Center  10/18/2022  9:00 AM [facility] PA/NP [facility] None   10/22/2022 11:20 AM Paseda, Baird Kay, FNP [facility] None       [name], EMT-P-Paramedic [phone] [facility] Paramedic  10/09/22

## 2022-10-11 ENCOUNTER — Telehealth (HOSPITAL_COMMUNITY): Payer: Self-pay | Admitting: *Deleted

## 2022-10-11 MED ORDER — TORSEMIDE 20 MG PO TABS: 40.0000 mg | ORAL_TABLET | Freq: Two times a day (BID) | ORAL | 3 refills | Status: DC

## 2022-10-11 MED ORDER — POTASSIUM CHLORIDE CRYS ER 20 MEQ PO TBCR: EXTENDED_RELEASE_TABLET | ORAL | 3 refills | Status: DC

## 2022-10-11 NOTE — Telephone Encounter (Signed)
LATE ENTRY from 10/09/22: [name], community paramedic called c/o pts wt up 13 lbs, sob at baseline, body aches, +edema up to knees bilat  Per Prince Rome, NP increase Torsemide to 40 mg BID and KCL 40 meq in am and 20 meq in pm, keep f/u appt as sch 12/15  [name] was aware and made med changes

## 2022-10-15 ENCOUNTER — Telehealth (HOSPITAL_COMMUNITY): Payer: Self-pay | Admitting: Emergency Medicine

## 2022-10-15 NOTE — Telephone Encounter (Signed)
Dale [name] called to confirm his upcoming appointments.  HF Clinic 12/15 @ 9:00 and PCP visit 12/19 @ 11:20.  Also he advised his weight yesterday was 230lb - down from 260lb.     [name], EMT-Paramedic [phone] 10/15/2022

## 2022-10-18 ENCOUNTER — Encounter (HOSPITAL_COMMUNITY): Payer: Self-pay

## 2022-10-18 ENCOUNTER — Ambulatory Visit (HOSPITAL_COMMUNITY)
Admission: RE | Admit: 2022-10-18 | Discharge: 2022-10-18 | Disposition: A | Discharge status: Home / Self Care | Payer: Medicaid Other | Source: Ambulatory Visit | Attending: Family Medicine | Admitting: Family Medicine

## 2022-10-18 VITALS — BP 98/64 | HR 91 | Wt 255.8 lb

## 2022-10-18 DIAGNOSIS — R079 Chest pain, unspecified: Secondary | ICD-10-CM | POA: Insufficient documentation

## 2022-10-18 DIAGNOSIS — F1010 Alcohol abuse, uncomplicated: Secondary | ICD-10-CM | POA: Diagnosis not present

## 2022-10-18 DIAGNOSIS — F1410 Cocaine abuse, uncomplicated: Secondary | ICD-10-CM

## 2022-10-18 DIAGNOSIS — I959 Hypotension, unspecified: Secondary | ICD-10-CM | POA: Insufficient documentation

## 2022-10-18 DIAGNOSIS — Z5901 Sheltered homelessness: Secondary | ICD-10-CM | POA: Diagnosis not present

## 2022-10-18 DIAGNOSIS — I130 Hypertensive heart and chronic kidney disease with heart failure and stage 1 through stage 4 chronic kidney disease, or unspecified chronic kidney disease: Secondary | ICD-10-CM | POA: Insufficient documentation

## 2022-10-18 DIAGNOSIS — R0602 Shortness of breath: Secondary | ICD-10-CM | POA: Insufficient documentation

## 2022-10-18 DIAGNOSIS — R5383 Other fatigue: Secondary | ICD-10-CM | POA: Insufficient documentation

## 2022-10-18 DIAGNOSIS — Z7984 Long term (current) use of oral hypoglycemic drugs: Secondary | ICD-10-CM | POA: Insufficient documentation

## 2022-10-18 DIAGNOSIS — I252 Old myocardial infarction: Secondary | ICD-10-CM | POA: Insufficient documentation

## 2022-10-18 DIAGNOSIS — F1021 Alcohol dependence, in remission: Secondary | ICD-10-CM

## 2022-10-18 DIAGNOSIS — I2510 Atherosclerotic heart disease of native coronary artery without angina pectoris: Secondary | ICD-10-CM

## 2022-10-18 DIAGNOSIS — E785 Hyperlipidemia, unspecified: Secondary | ICD-10-CM | POA: Insufficient documentation

## 2022-10-18 DIAGNOSIS — Z79899 Other long term (current) drug therapy: Secondary | ICD-10-CM | POA: Diagnosis not present

## 2022-10-18 DIAGNOSIS — I739 Peripheral vascular disease, unspecified: Secondary | ICD-10-CM

## 2022-10-18 DIAGNOSIS — Z139 Encounter for screening, unspecified: Secondary | ICD-10-CM

## 2022-10-18 DIAGNOSIS — N183 Chronic kidney disease, stage 3 unspecified: Secondary | ICD-10-CM | POA: Diagnosis not present

## 2022-10-18 DIAGNOSIS — I5022 Chronic systolic (congestive) heart failure: Secondary | ICD-10-CM | POA: Diagnosis present

## 2022-10-18 DIAGNOSIS — I428 Other cardiomyopathies: Secondary | ICD-10-CM | POA: Diagnosis not present

## 2022-10-18 DIAGNOSIS — I272 Pulmonary hypertension, unspecified: Secondary | ICD-10-CM | POA: Diagnosis not present

## 2022-10-18 DIAGNOSIS — Z7982 Long term (current) use of aspirin: Secondary | ICD-10-CM | POA: Insufficient documentation

## 2022-10-18 LAB — BASIC METABOLIC PANEL
Anion gap: 7 (ref 5–15)
BUN: 17 mg/dL (ref 6–20)
CO2: 31 mmol/L (ref 22–32)
Calcium: 7.9 mg/dL — ABNORMAL LOW (ref 8.9–10.3)
Chloride: 103 mmol/L (ref 98–111)
Creatinine, Ser: 1.61 mg/dL — ABNORMAL HIGH (ref 0.61–1.24)
GFR, Estimated: 49 mL/min — ABNORMAL LOW (ref >60)
Glucose, Bld: 95 mg/dL (ref 70–99)
Potassium: 3.8 mmol/L (ref 3.5–5.1)
Sodium: 141 mmol/L (ref 135–145)

## 2022-10-18 LAB — BRAIN NATRIURETIC PEPTIDE: B Natriuretic Peptide: 1936 pg/mL — ABNORMAL HIGH (ref 0.0–100.0)

## 2022-10-18 MED ORDER — TORSEMIDE 20 MG PO TABS: 60.0000 mg | ORAL_TABLET | Freq: Two times a day (BID) | ORAL | 8 refills | Status: DC

## 2022-10-18 MED ORDER — POTASSIUM CHLORIDE CRYS ER 20 MEQ PO TBCR: 40.0000 meq | EXTENDED_RELEASE_TABLET | Freq: Two times a day (BID) | ORAL | 8 refills | Status: DC

## 2022-10-18 NOTE — Progress Notes (Signed)
ADVANCED HF CLINIC NOTE  PCP: None HF Cardiologist: Dr. [name]  HPI: Dale [name] is a 61 y.o. male  with a PMH significant for hypertension, polysubstance use (including cocaine & alcohol), chronic combined CHF, and CAD.   He was admitted 11/19 with signs/symptoms of CHF, in the setting of poorly controlled hypertension alcohol and cocaine use. He was started on IV Lasix and echo showed EF 20%, G2 DD.  He left AMA.   Readmitted 12/19 with CHF exacerbation, started on IV diuretics, beta-blocker, ARB and BiDil.  Discharged on oral torsemide.  He remained positive for cocaine on UDS.   Admitted 4/20 with chest pain and CHF. Diagnosed with NSTEMI. He declined cardiac catheterization.     Admitted 5/20 with CHF and chest pain, still using cocaine, but no evidence of ischemia.  Diuresed and discharged.  Echo (5/20) EF 20 to 25%, elevated LVEDP, diffuse hypokinesis with worse hypokinesis and inferior wall moderately enlarged RV with normal function.  Admitted in 7/20 with acute CHF and underwent L/RHC showing severe single vessel CAD with CTO of pRCA, mild to moderate non-obs CAD involving LAD and LCx, moderately elevated left heart filling pressures, severely elevated right heart filling pressures, mild pulmonary hypertension and normal CO/CI by Fick. He was treated medically.  2 CHF admissions 2021. 6 admissions in 2022.    Admitted 7/22 with NSTEMI and troponin > 1000,  but treated medically due to continued cocaine use and concern for poor medication adherence if stent was needed.   Admitted 8/22 with CHF exacerbation.  Echo with EF <10%, medications restarted. Farxiga and spiro added.    He was [facility] in [facility] 9/[facility], doing well from CHF standpoint and compliant with medication regimen.   Admitted again in 4/23 with CHF, refused PICC line and refused RHC/LHC.  Concern for low output HF with AKI/creatinine up to 2.  Patient was started on milrinone empirically with good diuresis,  creatinine down to about 1.7. Echo this admission showed EF 10-15%, global HK, severe LV dilation, severely enlarged RV with severely decreased systolic function, severe biatrial enlargement, moderate MR, dilated IVC.   Follow up 5/23, out of all meds except Bidil x 5 days and was significantly volume overloaded. Arranged for outpatient RHC/LHC, and admitted with elevated filling pressures and low output, CI 1.53. RCA was chronically occluded but he did not have obstructive disease on left. Started on IV lasix and milrinone. Able to wean drips and titrate GDMT. Discharged to a friend's house, weight 248 lbs.  Follow up 7/23, NYHA III and volume overloaded on exam. Metolazone 2.5/40 KCL added weekly to diuretic regimen. Followed by paramedicine.  Today he returns for HF follow up. Overall feeling fair. He has generalized fatigue.He has shortness of breath with walking further distances on flat ground. Legs are swelling some today. Denies palpitations, CP, dizziness, or PND/Orthopnea. Appetite ok. No fever or chills. Weight at home 250 pounds. Taking all medications, missed a day of torsemide. Drinking more ETOH recently. Smokes THC frequently. No further cocaine use. Staying with a friend.  ECG (personally reviewed): none ordered today.  Labs (9/22): K 3.9, creatinine 1.67 Labs (4/23): K 3.6, creatinine 1.69, hgb 12.8, LDL 87 Labs (5/23): K 3.7, creatinine 1.85 Labs (6/23): K 4.0, creatinine 1.45 Labs (9/23): K 3.5, creatinine 1.59, GFR 50 Labs (11/23): K 3.9, creatinine 1.25  Review of Systems:  All systems reviewed and negative except as per HPI.   PMH: 1. ETOH abuse.  2. Cocaine abuse: Has been  off cocaine x months.  3. CAD: Cath in 7/20 with totally occluded RCA with collaterals.  - NSTEMI in 7/22 with HS-TnI > 1000 but no cath.  4. CKD stage 3 5. HTN 6. Hyperlipidemia 7. Chronic systolic CHF: Suspect mixed ischemic/nonischemic cardiomyopathy.  See cath report as above from 7/20.  He  continues to drink ETOH heavily, used cocaine in the past.  Both could contribute.  - Echo (4/23): EF 10-15%, global HK, severe LV dilation, severely enlarged RV with severely decreased systolic function, severe biatrial enlargement, moderate MR, dilated IVC - R/LHC (5/23): occluded pRCA w/ collaterals, nonobstructive disease in the left system, markedly elevated filling pressures,  pulmonary venous hypertension, low CO. RA mean 23, RV 50/21, PA 53/32, mean 40, PCWP mean 39, LV 117/35, AO 117/82, CO/CI (Fick): 3.5/1.53, PVR < 1 WU, Oxygen saturations: PA 47%, AO 97%  ROS: All systems reviewed and negative except as per HPI.   Current Outpatient Medications  Medication Sig Dispense Refill   aspirin 81 MG EC tablet Take 1 tablet (81 mg total) by mouth daily. Swallow whole. 30 tablet 2   atorvastatin (LIPITOR) 40 MG tablet TAKE ONE TABLET BY MOUTH ONCE DAILY FOR CHOLESTEROL 90 tablet 3   digoxin (LANOXIN) 0.125 MG tablet TAKE ONE TABLET BY MOUTH ONCE DAILY 90 tablet 3   FARXIGA 10 MG TABS tablet TAKE ONE TABLET BY MOUTH ONCE DAILY 90 tablet 3   potassium chloride SA (KLOR-CON M) 20 MEQ tablet Take 2 tablets (40 mEq total) by mouth in the morning AND 1 tablet (20 mEq total) every evening. 180 tablet 3   torsemide (DEMADEX) 20 MG tablet Take 2 tablets (40 mg total) by mouth 2 (two) times daily. 180 tablet 3   pantoprazole (PROTONIX) 40 MG tablet Take 1 tablet (40 mg total) by mouth daily. Take 1 tablet daily for 14 days (Patient not taking: Reported on 10/09/2022) 14 tablet 0   No current facility-administered medications for this encounter.   Allergies  Allergen Reactions   Hydrocodone Itching   Social History   Socioeconomic History   Marital status: Single    Spouse name: none   Number of children: Not on file   Years of education: Not on file   Highest education level: Not on file  Occupational History   Occupation: Disabled  Tobacco Use   Smoking status: Never   Smokeless tobacco:  Never  Vaping Use   Vaping Use: Never used  Substance and Sexual Activity   Alcohol use: Yes    Comment: 1 quarts a week   Drug use: Not Currently    Frequency: 3.0 times per week    Types: Marijuana, Cocaine   Sexual activity: Not Currently  Other Topics Concern   Not on file  Social History Narrative   Lives w/ mother in [place] but recently visiting w/ dtr in [place].   Social Determinants of Health   Financial Resource Strain: High Risk (02/20/2022)   Overall Financial Resource Strain (CARDIA)    Difficulty of Paying Living Expenses: Hard  Food Insecurity: Food Insecurity Present (04/19/2022)   Hunger Vital Sign    Worried About Running Out of Food in the Last Year: Sometimes true    Ran Out of Food in the Last Year: Sometimes true  Transportation Needs: Unmet Transportation Needs (05/03/2022)   PRAPARE - Administrator, Civil Service (Medical): Yes    Lack of Transportation (Non-Medical): Yes  Physical Activity: Not on file  Stress: Not on  file  Social Connections: Not on file  Intimate Partner Violence: Not on file   Family History  Problem Relation Age of Onset   Hypertension Maternal Grandmother    BP 98/64   Pulse 91   Wt 116 kg (255 lb 12.8 oz)   SpO2 96%   BMI 35.68 kg/m   Wt Readings from Last 3 Encounters:  10/18/22 116 kg (255 lb 12.8 oz)  10/09/22 117.9 kg (260 lb)  09/20/22 112.4 kg (247 lb 14.4 oz)   PHYSICAL EXAM: General:  NAD. No resp difficulty, walked into clinic HEENT: Normal Neck: Supple. No JVD. Carotids 2+ bilat; no bruits. No lymphadenopathy or thryomegaly appreciated. Cor: PMI nondisplaced. Regular rate & rhythm. No rubs, gallops, 2/6 SEM RUSB Lungs: Clear Abdomen: Soft, nontender, nondistended. No hepatosplenomegaly. No bruits or masses. Good bowel sounds. Extremities: No cyanosis, clubbing, rash, 2+ BLE pre-tibial edema Neuro: Alert & oriented x 3, cranial nerves grossly intact. Moves all 4 extremities w/o difficulty. Affect  pleasant.  ASSESSMENT & PLAN: 1. 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 He has a history of alcohol and cocaine abuse, which likely contributes to cardiomyopathy.  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 Most recent echo in 4/23 showed EF 10-15%, global HK, severe LV dilation, severely enlarged RV with severely decreased systolic function, severe biatrial enlargement, moderate MR, dilated IVC. LHC/RHC this admit showed low CI 1.53 and markedly high filling pressures, RCA was chronically occluded but he did not have obstructive disease on left. NYHA III, he is volume overloaded on exam. He drinks lot of fluids and ETOH. Discloses eating processed meats. - Increase torsemide to 60 mg bid, increase KCL to 40 bid. BMET/BNP today, repeat BMET in 10-14 days. - Place compression hose. Given Rx today. - Continue digoxin 0.125 mg daily. Dig level 0.5 08/30/22 - Continue Farxiga 10 mg daily. - No BP room to add Entresto or spiro. - Off BiDil with low BP. - Not candidate for ICD unless he can cut back on ETOH abuse.  He has stopped cocaine. Narrow QRS so no CRT.  - Low output HF but with substance abuse, poor compliance, and living situation (basically homeless, lives with friends or in hotels), he is not a candidate for advanced therapies.  Would also be poor home milrinone candidate.   2. CAD: LHC in 7/20 and 5/23 showed CTO of RCA treated medically, left system ok. Suspect cardiomyopathy is primarily nonischemic. No chest pain. - Continue ASA 81 daily + atorvastatin 40 mg daily.  3. Cocaine Use Disorder: Has been off cocaine for several years.  4. Alcohol Use Disorder: Advised complete cessation. 5. CKD stage 3: Labs today. 6. ? PAD: Arterial dopplers with normal ABIs. 7. SDOH: uses public transportation. Establishing with PCP in a couple months. - He is homeless, staying with a friend.   - Continue paramedicine, appreciate their assistance.   Follow up in 3-4 months  with Dr. [name].  Prince Rome, FNP-BC 10/18/22

## 2022-10-18 NOTE — Patient Instructions (Addendum)
Thank you for coming in today  Labs were done today, if any labs are abnormal the clinic will call you No news is good news  INCREASE Torsemide to 60 mg 3 tablets 2 times a day INCREASE Potassium to 40 meq 2 tablets 2 times a day  Your physician recommends that you schedule a follow-up appointment in:  3 months with Dr. [name]  Your physician recommends that you return for lab work in:  10-14 day BMET   You have been given a prescription for compression hose. And a list of places who supply them. Please wear your compression hose daily, place them on as soon as you get up in the morning and remove before you go to bed at night.    Do the following things EVERYDAY: Weigh yourself in the morning before breakfast. Write it down and keep it in a log. Take your medicines as prescribed Eat low salt foods--Limit salt (sodium) to 2000 mg per day.  Stay as active as you can everyday Limit all fluids for the day to less than 2 liters  At the [facility], you and your health needs are our priority. As part of our continuing mission to provide you with exceptional heart care, we have created designated Provider Care Teams. These Care Teams include your primary Cardiologist (physician) and Advanced Practice Providers (APPs- Physician Assistants and Nurse Practitioners) who all work together to provide you with the care you need, when you need it.   You may see any of the following providers on your designated Care Team at your next follow up: Dr Arvilla Meres Dr Marca Ancona Dr. Marcos Eke, NP Robbie Lis, [place] [facility] [place], [place] Brynda Peon, NP Karle Plumber, PharmD   Please be sure to bring in all your medications bottles to every appointment.   If you have any questions or concerns before your next appointment please send [place] a message through [place] or call our office at [phone].    TO LEAVE A MESSAGE FOR THE NURSE SELECT  OPTION 2, PLEASE LEAVE A MESSAGE INCLUDING: YOUR NAME DATE OF BIRTH CALL BACK NUMBER REASON FOR CALL**this is important as we prioritize the call backs  YOU WILL RECEIVE A CALL BACK THE SAME DAY AS LONG AS YOU CALL BEFORE 4:00 PM

## 2022-10-22 ENCOUNTER — Telehealth (HOSPITAL_COMMUNITY): Payer: Self-pay | Admitting: Emergency Medicine

## 2022-10-22 ENCOUNTER — Encounter (HOSPITAL_COMMUNITY): Payer: Self-pay | Admitting: Emergency Medicine

## 2022-10-22 ENCOUNTER — Ambulatory Visit: Payer: Medicaid Other | Admitting: Nurse Practitioner

## 2022-10-22 NOTE — Telephone Encounter (Signed)
Text Mr. Crichlow to call me back regarding prescription drop off.    [name], EMT-Paramedic [phone] 10/22/2022

## 2022-10-22 NOTE — Progress Notes (Signed)
Picked up prescriptions from [facility] Potassium & Torsemide will deliver this afternoon.    [name], EMT-Paramedic [phone] 10/22/2022

## 2022-10-22 NOTE — Telephone Encounter (Signed)
Mr. Wolak returned my call.  I advised him that I needed to get his prescriptions delivered to him and he advised he wasn't home.  He stated, "I have enough to get me through tomorrow.  You can come see me tomorrow." I advised him to call me tomorrow to let me know what time he would be available. He advised he would do same.    [name], EMT-Paramedic [phone] 10/22/2022

## 2022-10-22 NOTE — Telephone Encounter (Signed)
Called to follow up w/ [name] to see if he's followed thru with his med increase made 12/15 to increase 60mg . Torsemide BID. And increase Potassium to 40mg . BID  He has not picked up his prescriptions yet.  I will p/u from [facility] and drop them off this afternoon.  He was at the [facility] when I called so he wasn't home.  , EMT-Paramedic [phone] 10/22/2022

## 2022-10-23 ENCOUNTER — Telehealth (HOSPITAL_COMMUNITY): Payer: Self-pay | Admitting: Emergency Medicine

## 2022-10-23 NOTE — Telephone Encounter (Signed)
Called and LVM to call me to set up med delivery and reconciliation,    [name], EMT-Paramedic [phone] 10/23/2022

## 2022-10-28 ENCOUNTER — Telehealth: Payer: Self-pay | Admitting: Physician Assistant

## 2022-10-28 NOTE — Telephone Encounter (Signed)
Received page from the patient regarding discoloration in the arm, described arm as being blotchy after having blood work the other day.  I attempted to call the patient twice using the number that was given [phone], had to leave a voicemail.  Attempted to call the patient using his mobile number that was documented in our system, the mobile number in our system does not work.  will attempt to call the patient again in 15 minutes.

## 2022-11-01 ENCOUNTER — Telehealth (HOSPITAL_COMMUNITY): Payer: Self-pay | Admitting: Emergency Medicine

## 2022-11-01 NOTE — Telephone Encounter (Signed)
Called and LVM to remind him of Lab appointment @ HF Clinic 11/05/22 @ 9:30.    [name], EMT-Paramedic [phone] 11/01/2022

## 2022-11-05 ENCOUNTER — Other Ambulatory Visit (HOSPITAL_COMMUNITY): Payer: Medicaid Other

## 2022-11-06 ENCOUNTER — Telehealth (HOSPITAL_COMMUNITY): Payer: Self-pay | Admitting: *Deleted

## 2022-11-06 ENCOUNTER — Other Ambulatory Visit (HOSPITAL_COMMUNITY): Payer: Self-pay | Admitting: Emergency Medicine

## 2022-11-06 NOTE — Progress Notes (Signed)
Paramedicine Encounter    Patient ID: Dale [name], male    DOB: 31-Jul-1961, 62 y.o.   MRN: [id number]   BP (!) 130/98 (BP Location: Left Arm)   Pulse 85   Wt 252 lb 12.8 oz (114.7 kg)   SpO2 96%   BMI 35.26 kg/m  Weight yesterday-not taken Last visit weight-252lb  ATF Dale [name] A&O x 4, skin W&D w/ good color.  Pt. Denies chest pain or SOB.  Lung sounds clear and equal bilat.  JVD present.  Edema noted to Dale lower extremities all the way up to his knees.  He has a prescription for compression hose but has not gotten it filled yet.  Encouraged him to get the compression hose and elevate his legs.   [name] has not been compliant with his medications.  He had a lab appointment yesterday at the clinic which he missed even though I called and LVM with reminder.   Discussed w/ Kevan Rosebush [name] edema to his legs and we determined to re-access the edema at next visit  Home visit complete.    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 ([place]) 06/13/2021   CHF (congestive heart failure) ([place]) 06/13/2021   Acute on chronic systolic CHF (congestive heart failure), NYHA class 4 ([place]us)    CKD (chronic kidney disease), stage III ([place]) 06/01/2021   Hypokalemia    Acute on chronic HFrEF (heart failure with reduced ejection fraction) ([place]) 12/25/2020   Coronary artery disease involving native coronary artery of native heart without  angina pectoris    Acute kidney injury superimposed on chronic kidney disease ([place]) 10/16/2020   Acute on chronic combined systolic and diastolic HF (heart failure) ([place]) 09/08/2020   Acute on chronic combined systolic (congestive) and diastolic (congestive) heart failure ([place])    Acute CHF (congestive heart failure) ([place]) 05/07/2019   Nausea & vomiting 05/07/2019   Acute exacerbation of CHF (congestive heart failure) ([place]) 03/11/2019   Troponin level elevated 03/11/2019   Hyperlipidemia 03/03/2019   NSVT (nonsustained ventricular tachycardia) ([place]) 03/03/2019   Anxiety and depression    NSTEMI (non-ST elevated myocardial infarction) ([place]) 40/98/1191   Acute systolic heart failure ([place]) 11/08/2018   Hypertensive urgency 09/28/2018   Acute pulmonary edema ([place]) 09/28/2018   Cocaine abuse ([place]) 09/28/2018   Substance abuse (Medina) 09/28/2018   Chronic kidney disease, stage II (mild) 09/28/2018   Normochromic anemia 47/82/9562   Acute systolic CHF (congestive heart failure) ([place]) 09/27/2018   Essential hypertension 03/11/2016   Obesity (BMI 30-39.9) 03/11/2016   Glucosuria 03/11/2016    Current Outpatient Medications:    aspirin 81 MG EC tablet, Take 1 tablet (81 mg total) by mouth daily. Swallow whole., Disp: 30 tablet, Rfl: 2   atorvastatin (LIPITOR) 40 MG tablet, TAKE ONE TABLET BY MOUTH ONCE DAILY FOR CHOLESTEROL, Disp: 90 tablet, Rfl: 3   digoxin (LANOXIN) 0.125 MG tablet, TAKE ONE TABLET BY MOUTH ONCE DAILY, Disp: 90 tablet, Rfl:  3   FARXIGA 10 MG TABS tablet, TAKE ONE TABLET BY MOUTH ONCE DAILY, Disp: 90 tablet, Rfl: 3   torsemide (DEMADEX) 20 MG tablet, Take 3 tablets (60 mg total) by mouth 2 (two) times daily., Disp: 180 tablet, Rfl: 8   pantoprazole (PROTONIX) 40 MG tablet, Take 1 tablet (40 mg total) by mouth daily. Take 1 tablet daily for 14 days (Patient not taking: Reported on 10/09/2022), Disp: 14 tablet, Rfl: 0   potassium chloride SA (KLOR-CON M) 20 MEQ tablet, Take 2  tablets (40 mEq total) by mouth 2 (two) times daily., Disp: 120 tablet, Rfl: 8 Allergies  Allergen Reactions   Hydrocodone Itching      Social History   Socioeconomic History   Marital status: Single    Spouse name: none   Number of children: Not on file   Years of education: Not on file   Highest education level: Not on file  Occupational History   Occupation: Disabled  Tobacco Use   Smoking status: Never   Smokeless tobacco: Never  Vaping Use   Vaping Use: Never used  Substance and Sexual Activity   Alcohol use: Yes    Comment: 1 quarts a week   Drug use: Not Currently    Frequency: 3.0 times per week    Types: Marijuana, Cocaine   Sexual activity: Not Currently  Other Topics Concern   Not on file  Social History Narrative   Lives w/ mother in [place] but recently visiting w/ dtr in [place].   Social Determinants of Health   Financial Resource Strain: High Risk (02/20/2022)   Overall Financial Resource Strain (CARDIA)    Difficulty of Paying Living Expenses: Hard  Food Insecurity: Food Insecurity Present (04/19/2022)   Hunger Vital Sign    Worried About Running Out of Food in the Last Year: Sometimes true    Ran Out of Food in the Last Year: Sometimes true  Transportation Needs: Unmet Transportation Needs (05/03/2022)   PRAPARE - Hydrologist (Medical): Yes    Lack of Transportation (Non-Medical): Yes  Physical Activity: Not on file  Stress: Not on file  Social Connections: Not on file  Intimate Partner Violence: Not on file    Physical Exam      Future Appointments  Date Time Provider [place]  11/25/2022 11:20 AM Renee Rival, FNP [facility] None  01/21/2023  9:40 AM Larey Dresser, MD [place] None       [name], [facility] Paramedic  11/06/22

## 2022-11-06 NOTE — Telephone Encounter (Signed)
[name], community paramedic contacted the office concerned with pt's LE edema. She states edema is up to knees bilat, he has never gotten his compression hose as ordered. His wt is 252 lbs today, denies SOB/CP, VS stable=130/98, 85. Pt is very noncompliant with meds, his pill box this week had more pills in it than were missing. Advised her to remind pt to get compression hose and wear daily, elevate legs when sitting, and to take meds. She will advise pt and f/u with him next week.

## 2022-11-12 ENCOUNTER — Other Ambulatory Visit (HOSPITAL_COMMUNITY): Payer: Medicaid Other

## 2022-11-14 ENCOUNTER — Telehealth (HOSPITAL_COMMUNITY): Payer: Self-pay | Admitting: Emergency Medicine

## 2022-11-14 ENCOUNTER — Telehealth (HOSPITAL_COMMUNITY): Payer: Self-pay | Admitting: Licensed Clinical Social Worker

## 2022-11-14 ENCOUNTER — Other Ambulatory Visit (HOSPITAL_COMMUNITY): Payer: Medicaid Other

## 2022-11-14 NOTE — Telephone Encounter (Signed)
Need no show letter #2
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
## 2022-11-14 NOTE — Telephone Encounter (Signed)
CSW consulted to reach out to patient to discuss two recent no shows and a cancellation.  CSW attempted to call pt at both listed numbers and left VMs on both but unable to reach.  Pt on [employer] program so called paramedic to inform of above and have them assist in connecting CSW and pt to discuss barriers to attending and what we can do to assist  Jorge Ny, [facility] Desk#: [phone] Cell#: [phone]

## 2022-11-19 ENCOUNTER — Telehealth (HOSPITAL_COMMUNITY): Payer: Self-pay | Admitting: Emergency Medicine

## 2022-11-19 ENCOUNTER — Other Ambulatory Visit (HOSPITAL_COMMUNITY): Payer: Self-pay | Admitting: Emergency Medicine

## 2022-11-19 NOTE — Telephone Encounter (Signed)
[name] called to schedule home visit today.  He still does not have a cell phone yet.  I will visit today at [place] 11/19/2022
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
## 2022-11-20 ENCOUNTER — Ambulatory Visit (HOSPITAL_COMMUNITY)
Admission: RE | Admit: 2022-11-20 | Discharge: 2022-11-20 | Disposition: A | Discharge status: Home / Self Care | Payer: Medicaid Other | Source: Ambulatory Visit | Attending: Cardiology | Admitting: Cardiology

## 2022-11-20 DIAGNOSIS — I5022 Chronic systolic (congestive) heart failure: Secondary | ICD-10-CM | POA: Diagnosis not present

## 2022-11-20 LAB — BASIC METABOLIC PANEL
Anion gap: 11 (ref 5–15)
BUN: 16 mg/dL (ref 6–20)
CO2: 27 mmol/L (ref 22–32)
Calcium: 8.2 mg/dL — ABNORMAL LOW (ref 8.9–10.3)
Chloride: 102 mmol/L (ref 98–111)
Creatinine, Ser: 1.43 mg/dL — ABNORMAL HIGH (ref 0.61–1.24)
GFR, Estimated: 56 mL/min — ABNORMAL LOW (ref >60)
Glucose, Bld: 88 mg/dL (ref 70–99)
Potassium: 3.9 mmol/L (ref 3.5–5.1)
Sodium: 140 mmol/L (ref 135–145)

## 2022-11-20 NOTE — Progress Notes (Signed)
H&V Care Navigation CSW Progress Note  Clinical Social Worker informed by front desk that pt requesting bus passes. CSW provided to pt to help him get home and checked with pt about accurate phone number as CSW had had trouble contacting him before then.  Pt reports he will have new phone next week and will reach out to paramedic to provide this information so we can update in our chart.   SDOH Screenings   Food Insecurity: Food Insecurity Present (04/19/2022)  Housing: High Risk (03/13/2022)  Transportation Needs: Unmet Transportation Needs (05/03/2022)  Alcohol Screen: High Risk (02/20/2022)  Depression (PHQ2-9): Medium Risk (05/05/2019)  Financial Resource Strain: High Risk (02/20/2022)  Tobacco Use: Low Risk  (10/18/2022)   Jorge Ny, LCSW Clinical Social Worker [facility] Desk#: [phone] Cell#: [phone]

## 2022-11-25 ENCOUNTER — Ambulatory Visit: Payer: Medicaid Other | Admitting: Nurse Practitioner

## 2022-11-27 ENCOUNTER — Ambulatory Visit: Payer: Self-pay | Admitting: Nurse Practitioner

## 2022-12-11 ENCOUNTER — Telehealth (HOSPITAL_COMMUNITY): Payer: Self-pay | Admitting: Emergency Medicine

## 2022-12-11 NOTE — Telephone Encounter (Signed)
Attempted to call old number.  Unable to LVM.    [name], [place] 12/11/2022

## 2022-12-12 ENCOUNTER — Telehealth (HOSPITAL_COMMUNITY): Payer: Self-pay | Admitting: Emergency Medicine

## 2022-12-12 NOTE — Telephone Encounter (Signed)
Dale [name] called to cancel home visit today stating, "I have a lot going on, can you come tomorrow?" Scheduled home visit for tomorrow @ 4:30.    [name], [place] 12/12/2022

## 2022-12-12 NOTE — Telephone Encounter (Signed)
Dale [name] called this morning  @ 7:56 to give me his new phone number and to request a visit for med rec.  Scheduled him for today @ 3:00.    [name], [place] 12/12/2022

## 2022-12-13 ENCOUNTER — Other Ambulatory Visit (HOSPITAL_COMMUNITY): Payer: Self-pay | Admitting: Emergency Medicine

## 2022-12-13 NOTE — Progress Notes (Signed)
Paramedicine Encounter    Patient ID: Dale [name], male    DOB: 1961-04-15, 62 y.o.   MRN: [id number]   Complaints NONE  Assessment Weight is up 10lbs but it has been a month since last visit and he has not been compliant with meds  Compliance with meds 50%  Pill box filled x 2 weeks  Refills needed Farxiga, Digoxin & Atorvastatin  Meds changes since last visit None    Social changes None   BP 110/70 (BP Location: Right Arm, Patient Position: Sitting, Cuff Size: Normal)   Pulse 75   Resp 16   Wt 262 lb (118.8 kg)   SpO2 95%   BMI 36.54 kg/m  Weight yesterday-not taken Last visit weight-252lb  Patient is now discharged from [facility].  Patient has/has not met the following goals:  Yes :Patient expresses basic understanding of medications and what they are for Yes :Patient able to verbalize heart failure specific dietary/fluid restrictions Yes :Patient is aware of who to call if they have medical concerns or if they need to schedule or change appts Yes :Patient has a scale for daily weights and weighs regularly Yes :Patient able to verbalize concerning symptoms when they should call the HF clinic (weight gain ranges, etc) Yes :Patient has a PCP and has seen within the past year or has upcoming appt Yes :Patient has reliable access to getting their medications Yes :Patient has shown they are able to reorder medications reliably No :Patient has had admission in past 30 days- if yes how many? No :Patient has had admission in past 90 days- if yes how many?  Discharge Comments: [name] has not been compliant with the requirements of the paramedicine program.  He has been difficult to reach and visits have been monthly w/ poor medication compliance.  Today he had strong odor of ETOH on his breath with admitted consuming of same.  He is A&O x 4.  He has some mild wheezing to the upper airway.  Mild edema noted to his ankles.  He is supposed to  wearing compression socks but has never gotten his prescription for same filled.  He has missed several office appointments.  He had scheduled a home visit with me for yesterday and cancelled an hour before the visit.  We rescheduled for today.   I called in his refills to [facility] and they are to deliver to him on Monday.  I provided him with numbers to [facility] (appts & triage numbers)  Also advised if he had questions that he could still reach me by phone.   I once again reviewed each med, pill box navigation, and nutritional choices and he verbalized he understands.  I advised him he has been given all the tools needed to be successful in taking care of himself but that ultimately it would be up to him to be compliant.    ACTION: Home visit completed  Skipper Cliche [id number] 12/13/22  Patient Care Team: Larey Dresser, MD as PCP - General (Cardiology)  Patient Active Problem List   Diagnosis Date Noted   Acute on chronic systolic (congestive) heart failure (Fremont) 03/18/2022   Acute on chronic systolic CHF (congestive heart failure) (Simpson) 03/18/2022   Alcohol use disorder, moderate, in early remission (Har[place] Springs) 07/13/2021   Adjustment disorder with mixed anxiety and depressed mood    Palliative care by specialist    Prolonged QT interval 06/14/2021   Combined systolic and diastolic  ACC/AHA stage C congestive heart failure (Helena) 06/13/2021   CHF (congestive heart failure) (HCC) 06/13/2021   Acute on chronic systolic CHF (congestive heart failure), NYHA class 4 (H[facility])    CKD (chronic kidney disease), stage III (Elmer) 06/01/2021   Hypokalemia    Acute on chronic HFrEF (heart failure with reduced ejection fraction) (Fl[place]) 12/25/2020   Coronary artery disease involving native coronary artery of native heart without angina pectoris    Acute kidney injury superimposed on chronic kidney disease ([place]brook) 10/16/2020   Acute on chronic combined systolic  and diastolic HF (heart failure) (Jam[place]own) 09/08/2020   Acute on chronic combined systolic (congestive) and diastolic (congestive) heart failure (HCC)    Acute CHF (congestive heart failure) (Tyrone) 05/07/2019   Nausea & vomiting 05/07/2019   Acute exacerbation of CHF (congestive heart failure) (Richfield) 03/11/2019   Troponin level elevated 03/11/2019   Hyperlipidemia 03/03/2019   NSVT (nonsustained ventricular tachycardia) (Mineola) 03/03/2019   Anxiety and depression    NSTEMI (non-ST elevated myocardial infarction) ([place] Falls) [id number]   Acute systolic heart failure (Cleveland) 11/08/2018   Hypertensive urgency 09/28/2018   Acute pulmonary edema (Auberry) 09/28/2018   Cocaine abuse ([place] Pump) 09/28/2018   Substance abuse (Freeburg) 09/28/2018   Chronic kidney disease, stage II (mild) 09/28/2018   Normochromic anemia [id number]   Acute systolic CHF (congestive heart failure) ([place] Pow[place]) 09/27/2018   Essential hypertension 03/11/2016   Obesity (BMI 30-39.9) 03/11/2016   Glucosuria 03/11/2016    Current Outpatient Medications:    atorvastatin (LIPITOR) 40 MG tablet, TAKE ONE TABLET BY MOUTH ONCE DAILY FOR CHOLESTEROL, Disp: 90 tablet, Rfl: 3   digoxin (LANOXIN) 0.125 MG tablet, TAKE ONE TABLET BY MOUTH ONCE DAILY, Disp: 90 tablet, Rfl: 3   FARXIGA 10 MG TABS tablet, TAKE ONE TABLET BY MOUTH ONCE DAILY, Disp: 90 tablet, Rfl: 3   potassium chloride SA (KLOR-CON M) 20 MEQ tablet, Take 2 tablets (40 mEq total) by mouth 2 (two) times daily., Disp: 120 tablet, Rfl: 8   torsemide (DEMADEX) 20 MG tablet, Take 3 tablets (60 mg total) by mouth 2 (two) times daily., Disp: 180 tablet, Rfl: 8   aspirin 81 MG EC tablet, Take 1 tablet (81 mg total) by mouth daily. Swallow whole. (Patient not taking: Reported on 12/13/2022), Disp: 30 tablet, Rfl: 2   pantoprazole (PROTONIX) 40 MG tablet, Take 1 tablet (40 mg total) by mouth daily. Take 1 tablet daily for 14 days (Patient not taking: Reported on 10/09/2022), Disp: 14 tablet, Rfl: 0 Allergies   Allergen Reactions   Hydrocodone Itching     Social History   Socioeconomic History   Marital status: Single    Spouse name: none   Number of children: Not on file   Years of education: Not on file   Highest education level: Not on file  Occupational History   Occupation: Disabled  Tobacco Use   Smoking status: Never   Smokeless tobacco: Never  Vaping Use   Vaping Use: Never used  Substance and Sexual Activity   Alcohol use: Yes    Comment: 1 quarts a week   Drug use: Not Currently    Frequency: 3.0 times per week    Types: Marijuana, Cocaine   Sexual activity: Not Currently  Other Topics Concern   Not on file  Social History Narrative   Lives w/ mother in [place] but recently visiting w/ dtr in [place].   Social Determinants of Health   Financial Resource Strain: High Risk (02/20/2022)   Overall Financial Resource  Strain (CARDIA)    Difficulty of Paying Living Expenses: Hard  Food Insecurity: Food Insecurity Present (04/19/2022)   Hunger Vital Sign    Worried About Running Out of Food in the Last Year: Sometimes true    Ran Out of Food in the Last Year: Sometimes true  Transportation Needs: Unmet Transportation Needs (05/03/2022)   PRAPARE - Hydrologist (Medical): Yes    Lack of Transportation (Non-Medical): Yes  Physical Activity: Not on file  Stress: Not on file  Social Connections: Not on file  Intimate Partner Violence: Not on file    Physical Exam      Future Appointments  Date Time Provider [place]  01/21/2023  9:40 AM Larey Dresser, MD [facility] None

## 2022-12-19 ENCOUNTER — Ambulatory Visit: Payer: Medicaid Other | Admitting: Family Medicine

## 2023-01-21 ENCOUNTER — Encounter (HOSPITAL_COMMUNITY): Payer: Medicaid Other | Admitting: Cardiology

## 2023-01-22 ENCOUNTER — Encounter (HOSPITAL_COMMUNITY): Payer: Self-pay

## 2023-01-22 ENCOUNTER — Other Ambulatory Visit: Payer: Self-pay

## 2023-01-22 ENCOUNTER — Emergency Department (HOSPITAL_COMMUNITY)
Admission: EM | Admit: 2023-01-22 | Discharge: 2023-01-22 | Disposition: A | Discharge status: Home / Self Care | Payer: Medicaid Other | Attending: Emergency Medicine | Admitting: Emergency Medicine

## 2023-01-22 ENCOUNTER — Emergency Department (HOSPITAL_COMMUNITY): Payer: Medicaid Other

## 2023-01-22 DIAGNOSIS — I5043 Acute on chronic combined systolic (congestive) and diastolic (congestive) heart failure: Secondary | ICD-10-CM | POA: Insufficient documentation

## 2023-01-22 DIAGNOSIS — Z7982 Long term (current) use of aspirin: Secondary | ICD-10-CM | POA: Insufficient documentation

## 2023-01-22 DIAGNOSIS — J9601 Acute respiratory failure with hypoxia: Secondary | ICD-10-CM | POA: Insufficient documentation

## 2023-01-22 DIAGNOSIS — I2510 Atherosclerotic heart disease of native coronary artery without angina pectoris: Secondary | ICD-10-CM | POA: Insufficient documentation

## 2023-01-22 DIAGNOSIS — R0602 Shortness of breath: Secondary | ICD-10-CM | POA: Diagnosis present

## 2023-01-22 DIAGNOSIS — Z1152 Encounter for screening for COVID-19: Secondary | ICD-10-CM | POA: Insufficient documentation

## 2023-01-22 DIAGNOSIS — I5023 Acute on chronic systolic (congestive) heart failure: Secondary | ICD-10-CM

## 2023-01-22 DIAGNOSIS — I110 Hypertensive heart disease with heart failure: Secondary | ICD-10-CM | POA: Diagnosis not present

## 2023-01-22 LAB — DIGOXIN LEVEL: Digoxin Level: 0.2 ng/mL — ABNORMAL LOW (ref 0.8–2.0)

## 2023-01-22 LAB — MAGNESIUM: Magnesium: 2.2 mg/dL (ref 1.7–2.4)

## 2023-01-22 LAB — RAPID URINE DRUG SCREEN, HOSP PERFORMED
Amphetamines: NOT DETECTED
Barbiturates: NOT DETECTED
Benzodiazepines: NOT DETECTED
Cocaine: NOT DETECTED
Opiates: NOT DETECTED
Tetrahydrocannabinol: POSITIVE — AB

## 2023-01-22 LAB — BASIC METABOLIC PANEL
Anion gap: 16 — ABNORMAL HIGH (ref 5–15)
BUN: 27 mg/dL — ABNORMAL HIGH (ref 6–20)
CO2: 22 mmol/L (ref 22–32)
Calcium: 8.7 mg/dL — ABNORMAL LOW (ref 8.9–10.3)
Chloride: 102 mmol/L (ref 98–111)
Creatinine, Ser: 1.53 mg/dL — ABNORMAL HIGH (ref 0.61–1.24)
GFR, Estimated: 52 mL/min — ABNORMAL LOW (ref >60)
Glucose, Bld: 108 mg/dL — ABNORMAL HIGH (ref 70–99)
Potassium: 3.6 mmol/L (ref 3.5–5.1)
Sodium: 140 mmol/L (ref 135–145)

## 2023-01-22 LAB — CBC WITH DIFFERENTIAL/PLATELET
Abs Immature Granulocytes: 0.02 10*3/uL (ref 0.00–0.07)
Basophils Absolute: 0 10*3/uL (ref 0.0–0.1)
Basophils Relative: 1 %
Eosinophils Absolute: 0.1 10*3/uL (ref 0.0–0.5)
Eosinophils Relative: 1 %
HCT: 38.3 % — ABNORMAL LOW (ref 39.0–52.0)
Hemoglobin: 13 g/dL (ref 13.0–17.0)
Immature Granulocytes: 0 %
Lymphocytes Relative: 11 %
Lymphs Abs: 0.8 10*3/uL (ref 0.7–4.0)
MCH: 29.5 pg (ref 26.0–34.0)
MCHC: 33.9 g/dL (ref 30.0–36.0)
MCV: 87 fL (ref 80.0–100.0)
Monocytes Absolute: 0.5 10*3/uL (ref 0.1–1.0)
Monocytes Relative: 7 %
Neutro Abs: 5.7 10*3/uL (ref 1.7–7.7)
Neutrophils Relative %: 80 %
Platelets: 281 10*3/uL (ref 150–400)
RBC: 4.4 MIL/uL (ref 4.22–5.81)
RDW: 17 % — ABNORMAL HIGH (ref 11.5–15.5)
WBC: 7.1 10*3/uL (ref 4.0–10.5)
nRBC: 0 % (ref 0.0–0.2)

## 2023-01-22 LAB — TROPONIN I (HIGH SENSITIVITY)
Troponin I (High Sensitivity): 45 ng/L — ABNORMAL HIGH (ref <18)
Troponin I (High Sensitivity): 42 ng/L — ABNORMAL HIGH (ref <18)

## 2023-01-22 LAB — RESP PANEL BY RT-PCR (RSV, FLU A&B, COVID)  RVPGX2
Influenza A by PCR: NEGATIVE
Influenza B by PCR: NEGATIVE
Resp Syncytial Virus by PCR: NEGATIVE
SARS Coronavirus 2 by RT PCR: NEGATIVE

## 2023-01-22 LAB — BRAIN NATRIURETIC PEPTIDE: B Natriuretic Peptide: 4060.4 pg/mL — ABNORMAL HIGH (ref 0.0–100.0)

## 2023-01-22 MED ORDER — ATORVASTATIN CALCIUM 40 MG PO TABS
40.0000 mg | ORAL_TABLET | Freq: Every day | ORAL | Status: DC
  Administered 2023-01-22: 40 mg via ORAL
  Filled 2023-01-22: qty 1

## 2023-01-22 MED ORDER — ASPIRIN 81 MG PO TBEC
81.0000 mg | DELAYED_RELEASE_TABLET | Freq: Every day | ORAL | Status: DC
  Administered 2023-01-22: 81 mg via ORAL
  Filled 2023-01-22: qty 1

## 2023-01-22 MED ORDER — ONDANSETRON HCL 4 MG/2ML IJ SOLN
4.0000 mg | Freq: Once | INTRAMUSCULAR | Status: AC
  Administered 2023-01-22: 4 mg via INTRAVENOUS
  Filled 2023-01-22: qty 2

## 2023-01-22 MED ORDER — ISOSORBIDE MONONITRATE ER 30 MG PO TB24: 30.0000 mg | ORAL_TABLET | Freq: Every day | ORAL | Status: DC

## 2023-01-22 MED ORDER — HYDRALAZINE HCL 20 MG/ML IJ SOLN
10.0000 mg | Freq: Once | INTRAMUSCULAR | Status: AC
  Administered 2023-01-22: 10 mg via INTRAVENOUS
  Filled 2023-01-22: qty 1

## 2023-01-22 MED ORDER — HYDRALAZINE HCL 25 MG PO TABS: 25.0000 mg | ORAL_TABLET | Freq: Three times a day (TID) | ORAL | Status: DC

## 2023-01-22 MED ORDER — POTASSIUM CHLORIDE CRYS ER 20 MEQ PO TBCR
40.0000 meq | EXTENDED_RELEASE_TABLET | Freq: Once | ORAL | Status: AC
  Administered 2023-01-22: 40 meq via ORAL
  Filled 2023-01-22: qty 2

## 2023-01-22 MED ORDER — FUROSEMIDE 10 MG/ML IJ SOLN
80.0000 mg | Freq: Once | INTRAMUSCULAR | Status: AC
  Administered 2023-01-22: 80 mg via INTRAVENOUS
  Filled 2023-01-22: qty 8

## 2023-01-22 MED ORDER — FUROSEMIDE 10 MG/ML IJ SOLN
60.0000 mg | Freq: Once | INTRAMUSCULAR | Status: AC
  Administered 2023-01-22: 60 mg via INTRAVENOUS
  Filled 2023-01-22: qty 6

## 2023-01-22 MED ORDER — METOLAZONE 5 MG PO TABS
5.0000 mg | ORAL_TABLET | Freq: Once | ORAL | Status: AC
  Administered 2023-01-22: 5 mg via ORAL
  Filled 2023-01-22: qty 1

## 2023-01-22 MED ORDER — AMIODARONE HCL 200 MG PO TABS: 200.0000 mg | ORAL_TABLET | Freq: Two times a day (BID) | ORAL | Status: DC

## 2023-01-22 NOTE — Consult Note (Addendum)
Advanced Heart Failure Team Consult Note   Primary Physician: Pcp, No PCP-Cardiologist:  None  Reason for Consultation: Heart Failure  HPI:    Dale [name] is seen today for evaluation of heart failure at the request of Dr Oswald Hillock.  [name] is a 62 year old with a history of HTN, polysubstance abuse, ETOH abuse, mixed NICM/ICM, and chronic HFrEF. Severely reduced EF dating back to 2019.   Has had multiple admit for HF exacerbation frequently associated with medication noncompliance.    Echo  2023 - EF 10-15%, global HK, severe LV dilation, severely enlarged RV with severely decreased systolic function, severe biatrial enlargement, moderate MR, dilated IVC    R/LHC (5/23): occluded pRCA w/ collaterals, nonobstructive disease in the left system, markedly elevated filling pressures,  pulmonary venous hypertension, low CO. RA mean 23, RV 50/21, PA 53/32, mean 40, PCWP mean 39, LV 117/35, AO 117/82, CO/CI (Fick): 3.5/1.53, PVR < 1 WU, Oxygen saturations: PA 47%, AO 97%  Previously followed by HF Paramedicine. Discharged 12/13/22 due to difficulty contacting him , ETOH abuse, and ongoing noncompliance issues. On the last visit, his pill box was set up and he was given phone number for [facility].   Started feeling bad 3 days ago. Says he ran out of medications.  He told Dale [name] he contacted [facility] but we called the pharmacy and they were unable to reach him. He has moved a few times. Has difficulty with transportation. Drinks alcohol every now and then. Denies recent cocaine use.  Drinks lots of fluids. Lives with his girlfried.   He no showed for HF appointment 01/21/23.   Presented to [facility] with increased dyspnea. And lower extremity edema. Out of HF meds for 3-4 days. CXR with cardiomegaly no acute findings. Pertinent labs BNP > 4000,  Resp panel negative, HS trop 45>42, creatinine 1.5, and  mag 2.2. Given 60 mg IV lasix with ~ 300 cc out.    Review of Systems: [y] = yes, [ ]  = no   General: Weight gain [ ] ; Weight loss [ ] ; Anorexia [ ] ; Fatigue [ Y]; Fever [ ] ; Chills [ ] ; Weakness [ ]   Cardiac: Chest pain/pressure [ ] ; Resting SOB [ ] ; Exertional SOB [Y ]; Orthopnea [ Y]; Pedal Edema [ ] ; Palpitations [ ] ; Syncope [ ] ; Presyncope [ ] ; Paroxysmal nocturnal dyspnea[ ]   Pulmonary: Cough [ ] ; Wheezing[ ] ; Hemoptysis[ ] ; Sputum [ ] ; Snoring [ ]   GI: Vomiting[ Y]; Dysphagia[ ] ; Melena[ ] ; Hematochezia [ ] ; Heartburn[ ] ; Abdominal pain [ ] ; Constipation [ ] ; Diarrhea [ ] ; BRBPR [ ]   GU: Hematuria[ ] ; Dysuria [ ] ; Nocturia[ ]   Vascular: Pain in legs with walking [ ] ; Pain in feet with lying flat [ ] ; Non-healing sores [ ] ; Stroke [ ] ; TIA [ ] ; Slurred speech [ ] ;  Neuro: Headaches[ ] ; Vertigo[ ] ; Seizures[ ] ; Paresthesias[ ] ;Blurred vision [ ] ; Diplopia [ ] ; Vision changes [ ]   Ortho/Skin: Arthritis [ ] ; Joint pain [ Y]; Muscle pain [ ] ; Joint swelling [ ] ; Back Pain [Y ]; Rash [ ]   Psych: Depression[ ] ; Anxiety[ ]   Heme: Bleeding problems [ ] ; Clotting disorders [ ] ; Anemia [ ]   Endocrine: Diabetes [ ] ; Thyroid dysfunction[ ]   Home Medications Prior to Admission medications   Medication Sig Start Date End Date Taking? Authorizing Provider  aspirin 81 MG EC tablet Take 1 tablet (81 mg total) by mouth daily. Swallow whole.  03/13/22  Yes Larey Dresser, MD  atorvastatin (LIPITOR) 40 MG tablet TAKE ONE TABLET BY MOUTH ONCE DAILY FOR CHOLESTEROL Patient taking differently: Take 40 mg by mouth daily. 06/27/22  Yes Larey Dresser, MD  digoxin (LANOXIN) 0.125 MG tablet TAKE ONE TABLET BY MOUTH ONCE DAILY 06/27/22  Yes Larey Dresser, MD  FARXIGA 10 MG TABS tablet TAKE ONE TABLET BY MOUTH ONCE DAILY 06/27/22  Yes Larey Dresser, MD  potassium chloride SA (KLOR-CON M) 20 MEQ tablet Take 2 tablets (40 mEq total) by mouth 2 (two) times daily. 10/18/22  Yes [place], Maricela Bo, FNP  torsemide (DEMADEX) 20 MG tablet Take 3 tablets (60 mg  total) by mouth 2 (two) times daily. 10/18/22 10/13/23 Yes MilfordMaricela Bo, FNP    Past Medical History: Past Medical History:  Diagnosis Date   Alcohol abuse    CAD (coronary artery disease)    a. 05/2019 Cath: LM nl, LAD 15p, 20m, LCX large/nl, OM2 30, OM3 20, RCA 100 CTO w/ bridging L->R collats to RPDA.   Chronic combined systolic and diastolic CHF (congestive heart failure) ([place])    a. 09/27/18 Echo: EF 20-25%, grade 2 DD; b. 03/2019 Echo: EF 20-25%; c. 05/2021 Echo: EF <10%, glob HK. GrII DD. Sev red RV fxn. Sev BAE. Mild MR. Mild-mod TR.   CKD (chronic kidney disease), stage II    Cocaine abuse ([place])    Hyperlipidemia LDL goal <70    Hypertension    Ischemic cardiomyopathy    a. 09/27/18 Echo: EF 20-25%, grade 2 DD; b. 03/2019 Echo: EF 20-25%; c. 05/2019 Cath: RCA 100 CTA, otw nonobs dzs; d. 05/2021 Echo: EF <10%, glob HK. GrII DD. Sev red RV fxn.   Morbid obesity ([place])    Noncompliance    Normocytic anemia     Past Surgical History: Past Surgical History:  Procedure Laterality Date   RIGHT/LEFT HEART CATH AND CORONARY ANGIOGRAPHY N/A 05/10/2019   Procedure: RIGHT/LEFT HEART CATH AND CORONARY ANGIOGRAPHY;  Surgeon: Nelva Bush, MD;  Location: [facility];  Service: Cardiovascular;  Laterality: N/A;   RIGHT/LEFT HEART CATH AND CORONARY ANGIOGRAPHY N/A 03/18/2022   Procedure: RIGHT/LEFT HEART CATH AND CORONARY ANGIOGRAPHY;  Surgeon: Larey Dresser, MD;  Location: [facility];  Service: Cardiovascular;  Laterality: N/A;    Family History: Family History  Problem Relation Age of Onset   Hypertension Maternal Grandmother     Social History: Social History   Socioeconomic History   Marital status: Single    Spouse name: none   Number of children: Not on file   Years of education: Not on file   Highest education level: Not on file  Occupational History   Occupation: Disabled  Tobacco Use   Smoking status: Never   Smokeless tobacco: Never  Vaping Use    Vaping Use: Never used  Substance and Sexual Activity   Alcohol use: Yes    Comment: 1 quarts a week   Drug use: Not Currently    Frequency: 3.0 times per week    Types: Marijuana, Cocaine   Sexual activity: Not Currently  Other Topics Concern   Not on file  Social History Narrative   Lives w/ mother in [place] but recently visiting w/ dtr in [place].   Social Determinants of Health   Financial Resource Strain: High Risk (02/20/2022)   Overall Financial Resource Strain (CARDIA)    Difficulty of Paying Living Expenses: Hard  Food Insecurity: Food Insecurity Present (04/19/2022)  Hunger Vital Sign    Worried About Running Out of Food in the Last Year: Sometimes true    Ran Out of Food in the Last Year: Sometimes true  Transportation Needs: Unmet Transportation Needs (05/03/2022)   PRAPARE - Hydrologist (Medical): Yes    Lack of Transportation (Non-Medical): Yes  Physical Activity: Not on file  Stress: Not on file  Social Connections: Not on file    Allergies:  Allergies  Allergen Reactions   Hydrocodone Itching    Objective:    Vital Signs:   Temp:  [97.5 F (36.4 C)-97.8 F (36.6 C)] 97.8 F (36.6 C) (03/20 0810) Pulse Rate:  [87-91] 91 (03/20 1000) Resp:  [20-21] 21 (03/20 1000) BP: (111-137)/(91-102) 111/91 (03/20 1000) SpO2:  [96 %-99 %] 99 % (03/20 1000) Weight:  [120.2 kg] 120.2 kg (03/20 0418) Last BM Date : 01/21/23  Weight change: Filed Weights   01/22/23 0418  Weight: 120.2 kg    Intake/Output:   Intake/Output Summary (Last 24 hours) at 01/22/2023 1054 Last data filed at 01/22/2023 1046 Gross per 24 hour  Intake --  Output 675 ml  Net -675 ml      Physical Exam    General:   No resp difficulty HEENT: normal Neck: supple. JVP to jaw . Carotids 2+ bilat; no bruits. No lymphadenopathy or thyromegaly appreciated. Cor: PMI nondisplaced. Regular rate & rhythm. No rubs or murmurs. +S3 Lungs: clear on 2 liters  Abdomen:  soft, nontender, nondistended. No hepatosplenomegaly. No bruits or masses. Good bowel sounds. Extremities: no cyanosis, clubbing, rash, edema Neuro: alert & orientedx3, cranial nerves grossly intact. moves all 4 extremities w/o difficulty. Affect pleasant   Telemetry   SR with brief NSVT   EKG   SR 87 bpm   Labs   Basic Metabolic Panel: Recent Labs  Lab 01/22/23 0428 01/22/23 0645  NA 140  --   K 3.6  --   CL 102  --   CO2 22  --   GLUCOSE 108*  --   BUN 27*  --   CREATININE 1.53*  --   CALCIUM 8.7*  --   MG  --  2.2    Liver Function Tests: No results for input(s): "AST", "ALT", "ALKPHOS", "BILITOT", "PROT", "ALBUMIN" in the last 168 hours. No results for input(s): "LIPASE", "AMYLASE" in the last 168 hours. No results for input(s): "AMMONIA" in the last 168 hours.  CBC: Recent Labs  Lab 01/22/23 0428  WBC 7.1  NEUTROABS 5.7  HGB 13.0  HCT 38.3*  MCV 87.0  PLT 281    Cardiac Enzymes: No results for input(s): "CKTOTAL", "CKMB", "CKMBINDEX", "TROPONINI" in the last 168 hours.  BNP: BNP (last 3 results) Recent Labs    08/30/22 1449 10/18/22 0940 01/22/23 0428  BNP 1,774.9* 1,936.0* 4,060.4*    ProBNP (last 3 results) No results for input(s): "PROBNP" in the last 8760 hours.   CBG: No results for input(s): "GLUCAP" in the last 168 hours.  Coagulation Studies: No results for input(s): "LABPROT", "INR" in the last 72 hours.   Imaging   DG Chest Port 1 View  Result Date: 01/22/2023 CLINICAL DATA:  Shortness of breath. EXAM: PORTABLE CHEST 1 VIEW COMPARISON:  PA Lat 03/18/2022. FINDINGS: Severe cardiomegaly is chronically noted. Central vascular prominence appears similar to the previous exam without overt edema. There is mild aortic tortuosity with stable mediastinum, scattered calcification of the transverse aorta. The inferior CP sulci were excluded from the  exam. The visualized lungs are clear. There is a tangle of overlying monitor wiring.  Thoracic cage is intact. No new osseous abnormality. IMPRESSION: 1. Chronic severe cardiomegaly and central vascular prominence without overt edema. No focal pneumonia is evident. 2. Aortic atherosclerosis. Electronically Signed   By: Telford Nab M.D.   On: 01/22/2023 05:13     Medications:     Current Medications:  potassium chloride  40 mEq Oral Once    Infusions:     Patient Profile  Christafer Mcclurg is a 62 y.o. male  with a PMH significant for hypertension, polysubstance use (including cocaine & alcohol), chronic combined CHF, and CAD. Out meds 3-4 days.   Started on IV lasix.     Assessment/Plan  1 A/C HFrEF  -EF has been down since 2019. NICM  Last cath 2023 No change in coronaries compared to prior cath, occluded proximal RCA with collaterals, nonobstructive disease in the left system.  - Most recent ECHO 2023 EF 10-15%. Out of HF meds x3 days. BNP > 4000. CO2 22.  BP elevated.  NYHA III. Volume overloaded. Needs additional diuresis. Give another dose of IV lasix + 5 mg metolazone.  - Watch for response.  - Will  give IV hydralazine now.   2. HTN  Elevated. Give 10 mg IV hydralazine now.   3. NSVT -Replacing K  - Mag ok.    4. CAD Cath 2023- No change in coronaries compared to prior cath, occluded proximal RCA with collaterals, nonobstructive disease in the left system.  - Can give atorvastatin 40 mg daily  - Givne aasGive statin + aspirin  5. Polysubstance Abuse Check UDS  Continue to drink alcohol. Discussed cessation   6. CKD Stage IIIa Creatinine ~ 1.4-1.6  Today creatinine 1.5.   7. Noncompliance Out of HF meds. Needs to call [facility] and verify address.  Difficult to manage with poor insight.     Will reassess diuretic response in a few hours. May be able to discharge.    Length of Stay: 0  Darrick Grinder, NP  01/22/2023, 10:54 AM  Advanced Heart Failure Team Pager [phone] (M-F; 7a - 5p)  Please contact [facility] for  night-coverage after hours (4p -7a ) and weekends on [email or web address]   Addendum >3 liters urine output noted.  Ok to d/c home. Restart home medications. Our pharmacy team verified with [facility] the ability to fill his med. They will deliver meds and $0 co-pay. Relayed that information to him.   HF follow up 3/26 at 8:30   [place] NP-C  1:39 PM  Patient seen and examined with the above-signed Advanced Practice Provider and/or Housestaff. I personally reviewed laboratory data, imaging studies and relevant notes. I independently examined the patient and formulated the important aspects of the plan. I have edited the note to reflect any of my changes or salient points. I have personally discussed the plan with the patient and/or family.  62 y/o male with systolic HF due to mixed iCM/NICM. Presents to ER today with HF flare after running out of meds x 3 days. BP high. Labs stable with elevated BNP  General:  Lying in bed. No resp difficulty HEENT: normal Neck: supple.JVP to ear Carotids 2+ bilat; no bruits. No lymphadenopathy or thryomegaly appreciated. Cor: PMI nondisplaced. Regular rate & rhythm. No rubs, gallops or murmurs. Lungs: clear Abdomen: soft, nontender, nondistended. No hepatosplenomegaly. No bruits or masses. Good bowel sounds. Extremities: no cyanosis, clubbing, rash, tr edema Neuro: alert &  orientedx3, cranial nerves grossly intact. moves all 4 extremities w/o difficulty. Affect pleasant  He was volume overloaded and hypertensive on arrival. Now improved with IV lasix, metolazone and IV hydralazine.   [place] for d/c home from ER. We have arranged for him to go pick up his outpatient meds at [facility].   Will arrange f/u in HF Clinic. D/w ER team.   Glori Bickers, MD  1:50 PM

## 2023-01-22 NOTE — Progress Notes (Signed)
Heart Failure Navigator Progress Note  Assessed for Heart & Vascular TOC clinic readiness.  Patient does not meet criteria due to Advanced Heart Failure Team patient.   Navigator will sign off at this time.    Hitoshi Werts, BSN, RN Heart Failure Nurse Navigator Secure Chat Only   

## 2023-01-22 NOTE — Progress Notes (Signed)
Patient is well know to CSW from previous visits. Patient states he is living with a friend in an apartment and denies any concerns with housing at this time. Patient states that he was previously on [employer] and was set up with [facility] to have his medications delivered. Patient acknowledged that he ran out of meds yesterday and needs to follow up with the pharmacy for refills. Patient shared that he has had some difficulty accessing transportation through the county. It turns out that patient has not changed his address with [place] and will need to contact the county and change address to [employer]. Patient verbalizes understanding and requested a bus pass in the meantime to get to his upcoming appointments. Patient grateful for the support and denies any other concerns at this time. Raquel Sarna, [place], [place]

## 2023-01-22 NOTE — ED Triage Notes (Signed)
SOB starting 4 days ago, pt is out of his lasix, O2 with medic has improved pt in both Spo2 and his SOB. Pt desated to 88% on RA when moving to stretcher. No other complaints from pt  Medic Vitals, 158/108, 90hr, 98% on 3L [place], 20

## 2023-01-22 NOTE — ED Notes (Signed)
Pt reported feeling nauseous and had an episode of emesis followed by several coughing spells. Darrick Grinder NP notified

## 2023-01-22 NOTE — ED Provider Notes (Signed)
Care of patient received from prior provider at 7:04 AM, please see their note for complete H/P and care plan.  Received handoff per ED course.  Clinical Course as of 01/22/23 1344  Wed Jan 22, 2023  0701 Stable  61 YOM with SOB and volume overload. Ran out of home meds. AHF consulted. On 3L O2 with a baseline of 0  [CC]  0915 Cards Master paged  [CC]  [phone] Cards responded with plan to admit [CC]    Clinical Course User Index [CC] Tretha Sciara, MD    Reassessment: Heart failure evaluated at bedside.  They have changed their plan from admission to discharge due to IV diuresis completed in emergency department.      Tretha Sciara, MD 01/22/23 1345

## 2023-01-22 NOTE — ED Notes (Signed)
Okay per MD [place] for pt to eat/drink; pt provided with lunch bag and water. Call light within reach

## 2023-01-22 NOTE — ED Provider Notes (Signed)
[facility] Provider Note   CSN: [place]:[id number] Arrival date & time: 01/22/23  0414     History  Chief Complaint  Patient presents with  . Shortness of Breath    Dale [name] is a 62 y.o. male.   Shortness of Breath    62 year old male with medical history significant for hypertension, cocaine abuse, combined systolic and diastolic CHF, ischemic cardiomyopathy, CAD, last EF 10 to 15% in April 2023 who presents to the emergency department with shortness of breath.  The patient states that he ran out of his home diuretic as he normally gets it in the mail.  He has not been taking the medication over the last few days and endorses worsening shortness of breath of the last 4 days.  Denies any cough, chest pain, fever or chills.  EMS was called and the patient was found to be 88% on room air, placed on 3 L O2 via nasal cannula with improvement in the patient's dyspnea and hypoxia.  He endorses mild bilateral lower extremity edema and a nonspecific weight gain.  Home Medications Prior to Admission medications   Medication Sig Start Date End Date Taking? Authorizing Provider  aspirin 81 MG EC tablet Take 1 tablet (81 mg total) by mouth daily. Swallow whole. Patient not taking: Reported on 12/13/2022 03/13/22   Larey Dresser, MD  atorvastatin (LIPITOR) 40 MG tablet TAKE ONE TABLET BY MOUTH ONCE DAILY FOR CHOLESTEROL 06/27/22   Larey Dresser, MD  digoxin (LANOXIN) 0.125 MG tablet TAKE ONE TABLET BY MOUTH ONCE DAILY 06/27/22   Larey Dresser, MD  FARXIGA 10 MG TABS tablet TAKE ONE TABLET BY MOUTH ONCE DAILY 06/27/22   Larey Dresser, MD  pantoprazole (PROTONIX) 40 MG tablet Take 1 tablet (40 mg total) by mouth daily. Take 1 tablet daily for 14 days Patient not taking: Reported on 10/09/2022 08/30/22   Rafael Bihari, FNP  potassium chloride SA (KLOR-CON M) 20 MEQ tablet Take 2 tablets (40 mEq total) by mouth 2 (two) times daily. 10/18/22    [place], Maricela Bo, FNP  torsemide (DEMADEX) 20 MG tablet Take 3 tablets (60 mg total) by mouth 2 (two) times daily. 10/18/22 10/13/23  Rafael Bihari, FNP      Allergies    Hydrocodone    Review of Systems   Review of Systems  Respiratory:  Positive for shortness of breath.   All other systems reviewed and are negative.   Physical Exam Updated Vital Signs BP (!) 137/95   Pulse 87   Temp (!) 97.5 F (36.4 C)   Resp 20   Ht 5\' 11"  (1.803 m)   Wt 120.2 kg   SpO2 99%   BMI 36.96 kg/m  Physical Exam Vitals and nursing note reviewed.  Constitutional:      General: He is not in acute distress.    Appearance: He is well-developed.  HENT:     Head: Normocephalic and atraumatic.  Eyes:     Conjunctiva/sclera: Conjunctivae normal.  Cardiovascular:     Rate and Rhythm: Normal rate and regular rhythm.     Heart sounds: No murmur heard. Pulmonary:     Effort: Pulmonary effort is normal. No respiratory distress.     Breath sounds: Examination of the right-lower field reveals rales. Examination of the left-lower field reveals rales. Rales present.  Abdominal:     Palpations: Abdomen is soft.     Tenderness: There is no abdominal tenderness.  Musculoskeletal:        General: No swelling.     Cervical back: Neck supple.     Right lower leg: Edema present.     Left lower leg: Edema present.  Skin:    General: Skin is warm and dry.     Capillary Refill: Capillary refill takes less than 2 seconds.  Neurological:     Mental Status: He is alert.  Psychiatric:        Mood and Affect: Mood normal.     ED Results / Procedures / Treatments   Labs (all labs ordered are listed, but only abnormal results are displayed) Labs Reviewed  BASIC METABOLIC PANEL - Abnormal; Notable for the following components:      Result Value   Glucose, Bld 108 (*)    BUN 27 (*)    Creatinine, Ser 1.53 (*)    Calcium 8.7 (*)    GFR, Estimated 52 (*)    Anion gap 16 (*)    All other components  within normal limits  CBC WITH DIFFERENTIAL/PLATELET - Abnormal; Notable for the following components:   HCT 38.3 (*)    RDW 17.0 (*)    All other components within normal limits  TROPONIN I (HIGH SENSITIVITY) - Abnormal; Notable for the following components:   Troponin I (High Sensitivity) 45 (*)    All other components within normal limits  RESP PANEL BY RT-PCR (RSV, FLU A&B, COVID)  RVPGX2  BRAIN NATRIURETIC PEPTIDE  DIGOXIN LEVEL  TROPONIN I (HIGH SENSITIVITY)    EKG EKG Interpretation  Date/Time:  Wednesday January 22 2023 04:34:27 EDT Ventricular Rate:  87 PR Interval:  239 QRS Duration: 130 QT Interval:  424 QTC Calculation: 511 R Axis:   43 Text Interpretation: Sinus rhythm Prolonged PR interval Probable left atrial enlargement Left bundle branch block Confirmed by Regan Lemming ([id number]) on 01/22/2023 4:44:51 AM  Radiology DG Chest Port 1 View  Result Date: 01/22/2023 CLINICAL DATA:  Shortness of breath. EXAM: PORTABLE CHEST 1 VIEW COMPARISON:  PA Lat 03/18/2022. FINDINGS: Severe cardiomegaly is chronically noted. Central vascular prominence appears similar to the previous exam without overt edema. There is mild aortic tortuosity with stable mediastinum, scattered calcification of the transverse aorta. The inferior CP sulci were excluded from the exam. The visualized lungs are clear. There is a tangle of overlying monitor wiring. Thoracic cage is intact. No new osseous abnormality. IMPRESSION: 1. Chronic severe cardiomegaly and central vascular prominence without overt edema. No focal pneumonia is evident. 2. Aortic atherosclerosis. Electronically Signed   By: Telford Nab M.D.   On: 01/22/2023 05:13    Procedures Procedures    Medications Ordered in ED Medications  furosemide (LASIX) injection 60 mg (60 mg Intravenous Given 01/22/23 0440)    ED Course/ Medical Decision Making/ A&P Clinical Course as of 01/22/23 0702  Wed Jan 22, 2023  0701 Stable  61 YOM with SOB and  volume overload. Ran out of home meds. AHF consulted.   [CC]    Clinical Course User Index [CC] Tretha Sciara, MD                             Medical Decision Making Amount and/or Complexity of Data Reviewed Labs: ordered. Radiology: ordered.  Risk Prescription drug management. Decision regarding hospitalization.    62 year old male with medical history significant for hypertension, cocaine abuse, combined systolic and diastolic CHF, ischemic cardiomyopathy, CAD, last EF 10 to 15%  in April 2023 who presents to the emergency department with shortness of breath.  The patient states that he ran out of his home diuretic as he normally gets it in the mail.  He has not been taking the medication over the last few days and endorses worsening shortness of breath of the last 4 days.  Denies any cough, chest pain, fever or chills.  EMS was called and the patient was found to be 88% on room air, placed on 3 L O2 via nasal cannula with improvement in the patient's dyspnea and hypoxia.  He endorses mild bilateral lower extremity edema and a nonspecific weight gain.  On arrival, the patient was afebrile, temperature 97.5, not tachycardic or tachypneic heart rate 87, RR 20, BP 137/95, saturating 99% on 2 L O2 via nasal cannula.  Physical exam concerning for rales and bilateral lower extremity edema.  The patient is presenting with acute hypoxic respiratory failure and rales on exam in the setting of running out of his home diuretic.  Concern for CHF exacerbation, additionally considered PE, pneumonia, pneumothorax, COVID-19, influenza.  Initial EKG revealed sinus rhythm, ventricular rate 87, left bundle branch block, no STEMI.  The patient was administered 60 mg of IV Lasix.  CXR: IMPRESSION:  1. Chronic severe cardiomegaly and central vascular prominence  without overt edema. No focal pneumonia is evident.  2. Aortic atherosclerosis.   Labs: CBC without a leukocytosis or anemia, initial troponin  45, repeat pending, COVID-19, influenza, RSV PCR testing collected and negative.  BNP and digoxin level collected and pending.  Digoxin level had to be sent to [facility].  BMP with stable but elevated serum creatinine to 1.53, no significant electrolyte abnormality.  0630 While in the ED, the patient had a single run of nonsustained VT. He remained HDS. No further episodes. Consult placed to advanced HF team given pt's EF of 10-15% and concern for HF exacerbation. BNP remains pending at signout, signout given to Dr. Oswald Hillock at 0700.      Final Clinical Impression(s) / ED Diagnoses Final diagnoses:  Acute on chronic combined systolic and diastolic congestive heart failure ([place])  Acute respiratory failure with hypoxia [facility])    Rx / DC Orders ED Discharge Orders     None         Regan Lemming, MD 01/22/23 [phone]

## 2023-01-28 ENCOUNTER — Ambulatory Visit (HOSPITAL_COMMUNITY)
Admit: 2023-01-28 | Discharge: 2023-01-28 | Disposition: A | Discharge status: Home / Self Care | Payer: Medicaid Other | Attending: Adult Health | Admitting: Adult Health

## 2023-01-28 ENCOUNTER — Encounter (HOSPITAL_COMMUNITY): Payer: Self-pay

## 2023-01-28 VITALS — BP 110/88 | HR 93 | Wt 243.4 lb

## 2023-01-28 DIAGNOSIS — Z7984 Long term (current) use of oral hypoglycemic drugs: Secondary | ICD-10-CM | POA: Diagnosis not present

## 2023-01-28 DIAGNOSIS — Z6833 Body mass index (BMI) 33.0-33.9, adult: Secondary | ICD-10-CM | POA: Insufficient documentation

## 2023-01-28 DIAGNOSIS — I272 Pulmonary hypertension, unspecified: Secondary | ICD-10-CM | POA: Diagnosis not present

## 2023-01-28 DIAGNOSIS — I130 Hypertensive heart and chronic kidney disease with heart failure and stage 1 through stage 4 chronic kidney disease, or unspecified chronic kidney disease: Secondary | ICD-10-CM | POA: Insufficient documentation

## 2023-01-28 DIAGNOSIS — I959 Hypotension, unspecified: Secondary | ICD-10-CM | POA: Insufficient documentation

## 2023-01-28 DIAGNOSIS — Z7982 Long term (current) use of aspirin: Secondary | ICD-10-CM | POA: Diagnosis not present

## 2023-01-28 DIAGNOSIS — E785 Hyperlipidemia, unspecified: Secondary | ICD-10-CM | POA: Diagnosis not present

## 2023-01-28 DIAGNOSIS — F1010 Alcohol abuse, uncomplicated: Secondary | ICD-10-CM | POA: Insufficient documentation

## 2023-01-28 DIAGNOSIS — I5042 Chronic combined systolic (congestive) and diastolic (congestive) heart failure: Secondary | ICD-10-CM | POA: Insufficient documentation

## 2023-01-28 DIAGNOSIS — I2582 Chronic total occlusion of coronary artery: Secondary | ICD-10-CM | POA: Insufficient documentation

## 2023-01-28 DIAGNOSIS — I255 Ischemic cardiomyopathy: Secondary | ICD-10-CM | POA: Insufficient documentation

## 2023-01-28 DIAGNOSIS — I2510 Atherosclerotic heart disease of native coronary artery without angina pectoris: Secondary | ICD-10-CM | POA: Insufficient documentation

## 2023-01-28 DIAGNOSIS — Z5900 Homelessness unspecified: Secondary | ICD-10-CM | POA: Insufficient documentation

## 2023-01-28 DIAGNOSIS — Z79899 Other long term (current) drug therapy: Secondary | ICD-10-CM | POA: Diagnosis not present

## 2023-01-28 DIAGNOSIS — N183 Chronic kidney disease, stage 3 unspecified: Secondary | ICD-10-CM | POA: Insufficient documentation

## 2023-01-28 DIAGNOSIS — I5040 Unspecified combined systolic (congestive) and diastolic (congestive) heart failure: Secondary | ICD-10-CM

## 2023-01-28 DIAGNOSIS — F1410 Cocaine abuse, uncomplicated: Secondary | ICD-10-CM | POA: Insufficient documentation

## 2023-01-28 DIAGNOSIS — F1021 Alcohol dependence, in remission: Secondary | ICD-10-CM | POA: Diagnosis not present

## 2023-01-28 LAB — BASIC METABOLIC PANEL
Anion gap: 13 (ref 5–15)
BUN: 23 mg/dL — ABNORMAL HIGH (ref 6–20)
CO2: 28 mmol/L (ref 22–32)
Calcium: 8.7 mg/dL — ABNORMAL LOW (ref 8.9–10.3)
Chloride: 97 mmol/L — ABNORMAL LOW (ref 98–111)
Creatinine, Ser: 1.53 mg/dL — ABNORMAL HIGH (ref 0.61–1.24)
GFR, Estimated: 52 mL/min — ABNORMAL LOW (ref >60)
Glucose, Bld: 99 mg/dL (ref 70–99)
Potassium: 3.9 mmol/L (ref 3.5–5.1)
Sodium: 138 mmol/L (ref 135–145)

## 2023-01-28 MED ORDER — ASPIRIN 81 MG PO TBEC: 81.0000 mg | DELAYED_RELEASE_TABLET | Freq: Every day | ORAL | 11 refills | Status: DC

## 2023-01-28 NOTE — Progress Notes (Signed)
PCP: He is establishing next month.  Primary Cardiologist: Dr Aundra Dubin   HPI: [name] is a 62 year old with a history of HTN, polysubstance abuse, ETOH abuse, mixed NICM/ICM, and chronic HFrEF. Severely reduced EF dating back to 2019.   Has had multiple admit for HF exacerbation frequently associated with medication noncompliance.     Echo  2023 - EF 10-15%, global HK, severe LV dilation, severely enlarged RV with severely decreased systolic function, severe biatrial enlargement, moderate MR, dilated IVC     R/LHC (5/23): occluded pRCA w/ collaterals, nonobstructive disease in the left system, markedly elevated filling pressures,  pulmonary venous hypertension, low CO.RA mean 23, RV 50/21, PA 53/32, mean 40, PCWP mean 39, LV 117/35, AO 117/82, CO/CI (Fick): 3.5/1.53, PVR < 1 WU, Oxygen saturations: PA 47%, AO 97%.   Previously followed by HF Paramedicine. Discharged 12/13/22 due to difficulty contacting him, ETOH abuse, and ongoing noncompliance issues. At his last visit HF Paramedic set up his pill box and he was given phone number for [facility].    Presented to [facility] 01/22/23 with increased dyspnea.. Out of HF meds for 3-4 days. Diuresed with IV lasix and sent home later that day. [facility] verified he had medications ready for pick up or delivery.   Today he returns for HF follow up.Overall feeling much better Denies SOB. Denies PND/Orthopnea. Appetite ok. No fever or chills. Weight at home 246 pounds. Drinking 1 bottle of alcohol per day. Smokes marijuana daily. Taking all medications. He has all medications except he ran out of aspirin. Lives with a friend. Trying to get housing. Uses bus passes.   ROS: All systems negative except as listed in HPI, PMH and Problem List.  SH:  Social History   Socioeconomic History   Marital status: Single    Spouse name: none   Number of children: Not on file   Years of education: Not on file   Highest education  level: Not on file  Occupational History   Occupation: Disabled  Tobacco Use   Smoking status: Never   Smokeless tobacco: Never  Vaping Use   Vaping Use: Never used  Substance and Sexual Activity   Alcohol use: Yes    Comment: 1 quarts a week   Drug use: Not Currently    Frequency: 3.0 times per week    Types: Marijuana, Cocaine   Sexual activity: Not Currently  Other Topics Concern   Not on file  Social History Narrative   Lives w/ mother in [place] but recently visiting w/ dtr in [place].   Social Determinants of Health   Financial Resource Strain: High Risk (02/20/2022)   Overall Financial Resource Strain (CARDIA)    Difficulty of Paying Living Expenses: Hard  Food Insecurity: Food Insecurity Present (04/19/2022)   Hunger Vital Sign    Worried About Running Out of Food in the Last Year: Sometimes true    Ran Out of Food in the Last Year: Sometimes true  Transportation Needs: Unmet Transportation Needs (05/03/2022)   PRAPARE - Hydrologist (Medical): Yes    Lack of Transportation (Non-Medical): Yes  Physical Activity: Not on file  Stress: Not on file  Social Connections: Not on file  Intimate Partner Violence: Not on file    FH:  Family History  Problem Relation Age of Onset   Hypertension Maternal Grandmother     Past Medical History:  Diagnosis Date   Alcohol abuse  CAD (coronary artery disease)    a. 05/2019 Cath: LM nl, LAD 15p, 90m, LCX large/nl, OM2 30, OM3 20, RCA 100 CTO w/ bridging L->R collats to RPDA.   Chronic combined systolic and diastolic CHF (congestive heart failure) ([place])    a. 09/27/18 Echo: EF 20-25%, grade 2 DD; b. 03/2019 Echo: EF 20-25%; c. 05/2021 Echo: EF <10%, glob HK. GrII DD. Sev red RV fxn. Sev BAE. Mild MR. Mild-mod TR.   CKD (chronic kidney disease), stage II    Cocaine abuse ([place])    Hyperlipidemia LDL goal <70    Hypertension    Ischemic cardiomyopathy    a. 09/27/18 Echo: EF 20-25%, grade 2 DD; b. 03/2019  Echo: EF 20-25%; c. 05/2019 Cath: RCA 100 CTA, otw nonobs dzs; d. 05/2021 Echo: EF <10%, glob HK. GrII DD. Sev red RV fxn.   Morbid obesity ([place])    Noncompliance    Normocytic anemia     Current Outpatient Medications  Medication Sig Dispense Refill   atorvastatin (LIPITOR) 40 MG tablet TAKE ONE TABLET BY MOUTH ONCE DAILY FOR CHOLESTEROL (Patient taking differently: Take 40 mg by mouth daily.) 90 tablet 3   digoxin (LANOXIN) 0.125 MG tablet TAKE ONE TABLET BY MOUTH ONCE DAILY 90 tablet 3   FARXIGA 10 MG TABS tablet TAKE ONE TABLET BY MOUTH ONCE DAILY 90 tablet 3   potassium chloride SA (KLOR-CON M) 20 MEQ tablet Take 2 tablets (40 mEq total) by mouth 2 (two) times daily. 120 tablet 8   torsemide (DEMADEX) 20 MG tablet Take 3 tablets (60 mg total) by mouth 2 (two) times daily. 180 tablet 8   aspirin 81 MG EC tablet Take 1 tablet (81 mg total) by mouth daily. Swallow whole. (Patient not taking: Reported on 01/28/2023) 30 tablet 2   No current facility-administered medications for this encounter.    Vitals:   01/28/23 0824  BP: 110/88  Pulse: 93  SpO2: 98%  Weight: 110.4 kg (243 lb 6.4 oz)   Wt Readings from Last 3 Encounters:  01/28/23 110.4 kg (243 lb 6.4 oz)  01/22/23 120.2 kg (265 lb)  12/13/22 118.8 kg (262 lb)    PHYSICAL EXAM: General: Walked in the clinic. No resp difficulty HEENT: normal Neck: supple. JVP flat. Carotids 2+ bilaterally; no bruits. No lymphadenopathy or thryomegaly appreciated. Cor: PMI normal. Regular rate & rhythm. No rubs, gallops or murmurs. Lungs: clear Abdomen: soft, nontender, nondistended. No hepatosplenomegaly. No bruits or masses. Good bowel sounds. Extremities: no cyanosis, clubbing, rash, edema Neuro: alert & orientedx3, cranial nerves grossly intact. Moves all 4 extremities w/o difficulty. Affect pleasant.  ASSESSMENT & PLAN: 1. 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 He has a history of alcohol and cocaine  abuse, which likely contributes to cardiomyopathy.  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 Most recent echo in 4/23 showed EF 10-15%, global HK, severe LV dilation, severely enlarged RV with severely decreased systolic function, severe biatrial enlargement, moderate MR, dilated IVC. LHC/RHC this admit showed low CI 1.53 and markedly high filling pressures, RCA was chronically occluded but he did not have obstructive disease on left.  - NYHA II. Volume status stable. Continue torsemide to 60 mg bid. - Continue digoxin 0.125 mg daily - Continue Farxiga 10 mg daily. - Would not add Entresto or spiro with soft BP.  - Off BiDil with low BP. - Not candidate for ICD unless he can cut back on ETOH abuse.  He has stopped cocaine. Narrow QRS so no  CRT.  - Low output HF but with substance abuse, poor compliance, and living situation (basically homeless, lives with friends or in hotels), he is not a candidate for advanced therapies.  Would also be poor home milrinone candidate.  - Reinforced medication compliance.  - Check BMET   2. CAD: LHC in 7/20 and 5/23 showed CTO of RCA treated medically, left system ok. Suspect cardiomyopathy is primarily nonischemic.  - Continue ASA 81 daily + atorvastatin 40 mg daily.  Refilled aspirin.  3. Cocaine Use Disorder: Has been off cocaine for several years.  4. Alcohol Use Disorder: Discussed cessation. 5. CKD stage 3: Check BMET  6. ? PAD: Arterial dopplers with normal ABIs. 7. SDOH: uses public transportation. Plans to establish with PCP next month. Given bus passes.   Follow up 4 weeks with APP.   Dale Behringer NP-C  8:28 AM

## 2023-01-28 NOTE — Patient Instructions (Signed)
There has been no changes to your medications.  Labs done today, your results will be available in MyChart, we will contact you for abnormal readings.  Your physician recommends that you schedule a follow-up appointment in: 4 weeks.  If you have any questions or concerns before your next appointment please send [place] a message through [place] or call our office at [phone].    TO LEAVE A MESSAGE FOR THE NURSE SELECT OPTION 2, PLEASE LEAVE A MESSAGE INCLUDING: YOUR NAME DATE OF BIRTH CALL BACK NUMBER REASON FOR CALL**this is important as we prioritize the call backs  YOU WILL RECEIVE A CALL BACK THE SAME DAY AS LONG AS YOU CALL BEFORE 4:00 PM  At the [facility], you and your health needs are our priority. As part of our continuing mission to provide you with exceptional heart care, we have created designated Provider Care Teams. These Care Teams include your primary Cardiologist (physician) and Advanced Practice Providers (APPs- Physician Assistants and Nurse Practitioners) who all work together to provide you with the care you need, when you need it.   You may see any of the following providers on your designated Care Team at your next follow up: Dr Glori Bickers Dr Loralie Champagne Dr. Roxana Hires, NP Lyda Jester, [place] [facility], [place] Forestine Na, NP Audry Riles, PharmD   Please be sure to bring in all your medications bottles to every appointment.    Thank you for choosing [facility]

## 2023-02-16 ENCOUNTER — Emergency Department (HOSPITAL_COMMUNITY): Payer: Medicaid Other

## 2023-02-16 ENCOUNTER — Encounter (HOSPITAL_COMMUNITY): Payer: Self-pay

## 2023-02-16 ENCOUNTER — Other Ambulatory Visit: Payer: Self-pay

## 2023-02-16 ENCOUNTER — Inpatient Hospital Stay (HOSPITAL_COMMUNITY)
Admission: EM | Admit: 2023-02-16 | Discharge: 2023-02-18 | DRG: 391 | Disposition: A | Discharge status: Home / Self Care | Payer: Medicaid Other | Attending: Internal Medicine | Admitting: Internal Medicine

## 2023-02-16 DIAGNOSIS — R1110 Vomiting, unspecified: Secondary | ICD-10-CM | POA: Diagnosis not present

## 2023-02-16 DIAGNOSIS — I5022 Chronic systolic (congestive) heart failure: Secondary | ICD-10-CM | POA: Diagnosis present

## 2023-02-16 DIAGNOSIS — I441 Atrioventricular block, second degree: Secondary | ICD-10-CM | POA: Diagnosis present

## 2023-02-16 DIAGNOSIS — I130 Hypertensive heart and chronic kidney disease with heart failure and stage 1 through stage 4 chronic kidney disease, or unspecified chronic kidney disease: Secondary | ICD-10-CM | POA: Diagnosis present

## 2023-02-16 DIAGNOSIS — F1210 Cannabis abuse, uncomplicated: Secondary | ICD-10-CM | POA: Diagnosis present

## 2023-02-16 DIAGNOSIS — D631 Anemia in chronic kidney disease: Secondary | ICD-10-CM | POA: Diagnosis present

## 2023-02-16 DIAGNOSIS — Z885 Allergy status to narcotic agent status: Secondary | ICD-10-CM

## 2023-02-16 DIAGNOSIS — Z79899 Other long term (current) drug therapy: Secondary | ICD-10-CM

## 2023-02-16 DIAGNOSIS — Z91148 Patient's other noncompliance with medication regimen for other reason: Secondary | ICD-10-CM

## 2023-02-16 DIAGNOSIS — I5023 Acute on chronic systolic (congestive) heart failure: Secondary | ICD-10-CM | POA: Diagnosis not present

## 2023-02-16 DIAGNOSIS — R112 Nausea with vomiting, unspecified: Secondary | ICD-10-CM | POA: Diagnosis present

## 2023-02-16 DIAGNOSIS — K219 Gastro-esophageal reflux disease without esophagitis: Principal | ICD-10-CM | POA: Diagnosis present

## 2023-02-16 DIAGNOSIS — N179 Acute kidney failure, unspecified: Secondary | ICD-10-CM | POA: Diagnosis present

## 2023-02-16 DIAGNOSIS — I255 Ischemic cardiomyopathy: Secondary | ICD-10-CM | POA: Diagnosis present

## 2023-02-16 DIAGNOSIS — I509 Heart failure, unspecified: Secondary | ICD-10-CM

## 2023-02-16 DIAGNOSIS — E785 Hyperlipidemia, unspecified: Secondary | ICD-10-CM | POA: Diagnosis present

## 2023-02-16 DIAGNOSIS — E876 Hypokalemia: Secondary | ICD-10-CM | POA: Diagnosis present

## 2023-02-16 DIAGNOSIS — Z823 Family history of stroke: Secondary | ICD-10-CM

## 2023-02-16 DIAGNOSIS — Z833 Family history of diabetes mellitus: Secondary | ICD-10-CM

## 2023-02-16 DIAGNOSIS — Z1152 Encounter for screening for COVID-19: Secondary | ICD-10-CM

## 2023-02-16 DIAGNOSIS — I5020 Unspecified systolic (congestive) heart failure: Secondary | ICD-10-CM

## 2023-02-16 DIAGNOSIS — F1010 Alcohol abuse, uncomplicated: Secondary | ICD-10-CM | POA: Diagnosis present

## 2023-02-16 DIAGNOSIS — N1831 Chronic kidney disease, stage 3a: Secondary | ICD-10-CM | POA: Diagnosis present

## 2023-02-16 DIAGNOSIS — I428 Other cardiomyopathies: Secondary | ICD-10-CM | POA: Diagnosis present

## 2023-02-16 DIAGNOSIS — Z91199 Patient's noncompliance with other medical treatment and regimen due to unspecified reason: Secondary | ICD-10-CM

## 2023-02-16 DIAGNOSIS — I2510 Atherosclerotic heart disease of native coronary artery without angina pectoris: Secondary | ICD-10-CM | POA: Diagnosis present

## 2023-02-16 DIAGNOSIS — Z8249 Family history of ischemic heart disease and other diseases of the circulatory system: Secondary | ICD-10-CM

## 2023-02-16 DIAGNOSIS — I472 Ventricular tachycardia, unspecified: Secondary | ICD-10-CM | POA: Diagnosis present

## 2023-02-16 LAB — COMPREHENSIVE METABOLIC PANEL
ALT: 14 U/L (ref 0–44)
AST: 25 U/L (ref 15–41)
Albumin: 3.3 g/dL — ABNORMAL LOW (ref 3.5–5.0)
Alkaline Phosphatase: 73 U/L (ref 38–126)
Anion gap: 13 (ref 5–15)
BUN: 26 mg/dL — ABNORMAL HIGH (ref 6–20)
CO2: 23 mmol/L (ref 22–32)
Calcium: 8.5 mg/dL — ABNORMAL LOW (ref 8.9–10.3)
Chloride: 102 mmol/L (ref 98–111)
Creatinine, Ser: 1.4 mg/dL — ABNORMAL HIGH (ref 0.61–1.24)
GFR, Estimated: 58 mL/min — ABNORMAL LOW (ref >60)
Glucose, Bld: 101 mg/dL — ABNORMAL HIGH (ref 70–99)
Potassium: 4.1 mmol/L (ref 3.5–5.1)
Sodium: 138 mmol/L (ref 135–145)
Total Bilirubin: 1.6 mg/dL — ABNORMAL HIGH (ref 0.3–1.2)
Total Protein: 7.1 g/dL (ref 6.5–8.1)

## 2023-02-16 LAB — RESP PANEL BY RT-PCR (RSV, FLU A&B, COVID)  RVPGX2
Influenza A by PCR: NEGATIVE
Influenza B by PCR: NEGATIVE
Resp Syncytial Virus by PCR: NEGATIVE
SARS Coronavirus 2 by RT PCR: NEGATIVE

## 2023-02-16 LAB — CBC
HCT: 38.7 % — ABNORMAL LOW (ref 39.0–52.0)
Hemoglobin: 12.7 g/dL — ABNORMAL LOW (ref 13.0–17.0)
MCH: 29.3 pg (ref 26.0–34.0)
MCHC: 32.8 g/dL (ref 30.0–36.0)
MCV: 89.2 fL (ref 80.0–100.0)
Platelets: 323 10*3/uL (ref 150–400)
RBC: 4.34 MIL/uL (ref 4.22–5.81)
RDW: 15.5 % (ref 11.5–15.5)
WBC: 7.7 10*3/uL (ref 4.0–10.5)
nRBC: 0 % (ref 0.0–0.2)

## 2023-02-16 LAB — LIPASE, BLOOD: Lipase: 23 U/L (ref 11–51)

## 2023-02-16 LAB — RAPID URINE DRUG SCREEN, HOSP PERFORMED
Amphetamines: NOT DETECTED
Barbiturates: NOT DETECTED
Benzodiazepines: NOT DETECTED
Cocaine: NOT DETECTED
Opiates: NOT DETECTED
Tetrahydrocannabinol: POSITIVE — AB

## 2023-02-16 LAB — MAGNESIUM: Magnesium: 2.1 mg/dL (ref 1.7–2.4)

## 2023-02-16 LAB — BRAIN NATRIURETIC PEPTIDE: B Natriuretic Peptide: 3511.2 pg/mL — ABNORMAL HIGH (ref 0.0–100.0)

## 2023-02-16 MED ORDER — IPRATROPIUM-ALBUTEROL 0.5-2.5 (3) MG/3ML IN SOLN
3.0000 mL | Freq: Once | RESPIRATORY_TRACT | Status: AC
  Administered 2023-02-16: 3 mL via RESPIRATORY_TRACT
  Filled 2023-02-16: qty 3

## 2023-02-16 MED ORDER — DIPHENHYDRAMINE HCL 50 MG/ML IJ SOLN: 12.5000 mg | Freq: Every evening | INTRAMUSCULAR | Status: DC | PRN

## 2023-02-16 MED ORDER — FUROSEMIDE 10 MG/ML IJ SOLN
60.0000 mg | Freq: Every day | INTRAMUSCULAR | Status: DC
  Administered 2023-02-16: 60 mg via INTRAVENOUS
  Filled 2023-02-16: qty 6

## 2023-02-16 MED ORDER — ENOXAPARIN SODIUM 40 MG/0.4ML IJ SOSY
40.0000 mg | PREFILLED_SYRINGE | INTRAMUSCULAR | Status: DC
  Filled 2023-02-16 (×2): qty 0.4

## 2023-02-16 MED ORDER — DAPAGLIFLOZIN PROPANEDIOL 10 MG PO TABS
10.0000 mg | ORAL_TABLET | Freq: Every day | ORAL | Status: DC
  Administered 2023-02-16 – 2023-02-18 (×3): 10 mg via ORAL
  Filled 2023-02-16 (×3): qty 1

## 2023-02-16 MED ORDER — TORSEMIDE 20 MG PO TABS
60.0000 mg | ORAL_TABLET | Freq: Two times a day (BID) | ORAL | Status: DC
  Administered 2023-02-16: 60 mg via ORAL
  Filled 2023-02-16: qty 3

## 2023-02-16 MED ORDER — DIGOXIN 125 MCG PO TABS
125.0000 ug | ORAL_TABLET | Freq: Every day | ORAL | Status: DC
  Administered 2023-02-16 – 2023-02-18 (×3): 125 ug via ORAL
  Filled 2023-02-16 (×3): qty 1

## 2023-02-16 MED ORDER — DIPHENHYDRAMINE HCL 50 MG/ML IJ SOLN: 12.5000 mg | Freq: Once | INTRAMUSCULAR | Status: DC

## 2023-02-16 MED ORDER — PANTOPRAZOLE SODIUM 20 MG PO TBEC
20.0000 mg | DELAYED_RELEASE_TABLET | Freq: Every day | ORAL | Status: DC
  Administered 2023-02-16 – 2023-02-17 (×2): 20 mg via ORAL
  Filled 2023-02-16 (×2): qty 1

## 2023-02-16 MED ORDER — ATORVASTATIN CALCIUM 40 MG PO TABS
40.0000 mg | ORAL_TABLET | Freq: Every day | ORAL | Status: DC
  Administered 2023-02-16 – 2023-02-18 (×3): 40 mg via ORAL
  Filled 2023-02-16 (×3): qty 1

## 2023-02-16 MED ORDER — FUROSEMIDE 10 MG/ML IJ SOLN
60.0000 mg | Freq: Once | INTRAMUSCULAR | Status: AC
  Administered 2023-02-16: 60 mg via INTRAVENOUS
  Filled 2023-02-16: qty 6

## 2023-02-16 MED ORDER — POTASSIUM CHLORIDE CRYS ER 20 MEQ PO TBCR
40.0000 meq | EXTENDED_RELEASE_TABLET | Freq: Two times a day (BID) | ORAL | Status: DC
  Administered 2023-02-16 (×2): 40 meq via ORAL
  Filled 2023-02-16 (×2): qty 2

## 2023-02-16 MED ORDER — ONDANSETRON HCL 4 MG/2ML IJ SOLN
4.0000 mg | Freq: Once | INTRAMUSCULAR | Status: AC
  Administered 2023-02-16: 4 mg via INTRAVENOUS
  Filled 2023-02-16: qty 2

## 2023-02-16 MED ORDER — METHYLPREDNISOLONE SODIUM SUCC 125 MG IJ SOLR
125.0000 mg | INTRAMUSCULAR | Status: AC
  Administered 2023-02-16: 125 mg via INTRAVENOUS
  Filled 2023-02-16: qty 2

## 2023-02-16 MED ORDER — ASPIRIN 81 MG PO TBEC
81.0000 mg | DELAYED_RELEASE_TABLET | Freq: Every day | ORAL | Status: DC
  Administered 2023-02-16 – 2023-02-18 (×3): 81 mg via ORAL
  Filled 2023-02-16 (×3): qty 1

## 2023-02-16 NOTE — ED Notes (Signed)
ED TO INPATIENT HANDOFF REPORT  ED Nurse Name and Phone #: Trula Ore [phone]  S Name/Age/Gender Dale [name] 62 y.o. male Room/Bed: 037C/037C  Code Status   Code Status: Full Code  Home/SNF/Other Home Patient oriented to: self, place, time, and situation Is this baseline? Yes   Triage Complete: Triage complete  Chief Complaint Intractable vomiting [R11.10]  Triage Note Patient C/O nonproductive cough, SHOB R/T coughing, generalized body aches N/V X 3 days, and dizziness, unable to hold down oral fluids or food   Allergies Allergies  Allergen Reactions   Hydrocodone Itching    Level of Care/Admitting Diagnosis ED Disposition     ED Disposition  Admit   Condition  --   Comment  Hospital Area: [facility] [[id number]]  Level of Care: Telemetry Medical [[id number]]  May place patient in observation at [facility] or [facility] if equivalent level of care is available:: Yes  Covid Evaluation: Asymptomatic - no recent exposure (last 10 days) testing not required  Diagnosis: Intractable vomiting [883254]  Admitting Physician: Miguel Aschoff [[id number]]  Attending Physician: Miguel Aschoff [[id number]]          B Medical/Surgery History Past Medical History:  Diagnosis Date   Alcohol abuse    CAD (coronary artery disease)    a. 05/2019 Cath: LM nl, LAD 15p, 67m, LCX large/nl, OM2 30, OM3 20, RCA 100 CTO w/ bridging L->R collats to RPDA.   Chronic combined systolic and diastolic CHF (congestive heart failure)    a. 09/27/18 Echo: EF 20-25%, grade 2 DD; b. 03/2019 Echo: EF 20-25%; c. 05/2021 Echo: EF <10%, glob HK. GrII DD. Sev red RV fxn. Sev BAE. Mild MR. Mild-mod TR.   CKD (chronic kidney disease), stage II    Cocaine abuse    Hyperlipidemia LDL goal <70    Hypertension    Ischemic cardiomyopathy    a. 09/27/18 Echo: EF 20-25%, grade 2 DD; b. 03/2019 Echo: EF 20-25%; c. 05/2019 Cath: RCA 100 CTA, otw nonobs dzs; d. 05/2021 Echo: EF <10%, glob HK.  GrII DD. Sev red RV fxn.   Morbid obesity    Noncompliance    Normocytic anemia    Past Surgical History:  Procedure Laterality Date   RIGHT/LEFT HEART CATH AND CORONARY ANGIOGRAPHY N/A 05/10/2019   Procedure: RIGHT/LEFT HEART CATH AND CORONARY ANGIOGRAPHY;  Surgeon: Yvonne Kendall, MD;  Location: [facility] INVASIVE CV LAB;  Service: Cardiovascular;  Laterality: N/A;   RIGHT/LEFT HEART CATH AND CORONARY ANGIOGRAPHY N/A 03/18/2022   Procedure: RIGHT/LEFT HEART CATH AND CORONARY ANGIOGRAPHY;  Surgeon: Laurey Morale, MD;  Location: [facility] INVASIVE CV LAB;  Service: Cardiovascular;  Laterality: N/A;     A IV Location/Drains/Wounds Patient Lines/Drains/Airways Status     Active Line/Drains/Airways     Name Placement date Placement time Site Days   Peripheral IV 02/16/23 22 G 1" Left;Posterior Hand 02/16/23  0400  Hand  less than 1            Intake/Output Last 24 hours No intake or output data in the 24 hours ending 02/16/23 1853  Labs/Imaging Results for orders placed or performed during the hospital encounter of 02/16/23 (from the past 48 hour(s))  Resp panel by RT-PCR (RSV, Flu A&B, Covid) Anterior Nasal Swab     Status: None   Collection Time: 02/16/23  6:28 AM   Specimen: Anterior Nasal Swab  Result Value Ref Range   SARS Coronavirus 2 by RT PCR NEGATIVE NEGATIVE   Influenza A  by PCR NEGATIVE NEGATIVE   Influenza B by PCR NEGATIVE NEGATIVE    Comment: (NOTE) The Xpert Xpress SARS-CoV-2/FLU/RSV plus assay is intended as an aid in the diagnosis of influenza from Nasopharyngeal swab specimens and should not be used as a sole basis for treatment. Nasal washings and aspirates are unacceptable for Xpert Xpress SARS-CoV-2/FLU/RSV testing.  Fact Sheet for Patients: BloggerCourse.com  Fact Sheet for Healthcare Providers: SeriousBroker.it  This test is not yet approved or cleared by the [place] FDA and has been authorized for  detection and/or diagnosis of SARS-CoV-2 by FDA under an Emergency Use Authorization (EUA). This EUA will remain in effect (meaning this test can be used) for the duration of the COVID-19 declaration under Section 564(b)(1) of the Act, 21 U.S.C. section 360bbb-3(b)(1), unless the authorization is terminated or revoked.     Resp Syncytial Virus by PCR NEGATIVE NEGATIVE    Comment: (NOTE) Fact Sheet for Patients: BloggerCourse.com  Fact Sheet for Healthcare Providers: SeriousBroker.it  This test is not yet approved or cleared by the [place] FDA and has been authorized for detection and/or diagnosis of SARS-CoV-2 by FDA under an Emergency Use Authorization (EUA). This EUA will remain in effect (meaning this test can be used) for the duration of the COVID-19 declaration under Section 564(b)(1) of the Act, 21 U.S.C. section 360bbb-3(b)(1), unless the authorization is terminated or revoked.  Performed at [facility], [address] [place], [place] [address]   Comprehensive metabolic panel     Status: Abnormal   Collection Time: 02/16/23  6:38 AM  Result Value Ref Range   Sodium 138 135 - 145 mmol/L   Potassium 4.1 3.5 - 5.1 mmol/L    Comment: HEMOLYSIS AT THIS LEVEL MAY AFFECT RESULT   Chloride 102 98 - 111 mmol/L   CO2 23 22 - 32 mmol/L   Glucose, Bld 101 (H) 70 - 99 mg/dL    Comment: Glucose reference range applies only to samples taken after fasting for at least 8 hours.   BUN 26 (H) 6 - 20 mg/dL   Creatinine, Ser 6.04 (H) 0.61 - 1.24 mg/dL   Calcium 8.5 (L) 8.9 - 10.3 mg/dL   Total Protein 7.1 6.5 - 8.1 g/dL   Albumin 3.3 (L) 3.5 - 5.0 g/dL   AST 25 15 - 41 U/L    Comment: HEMOLYSIS AT THIS LEVEL MAY AFFECT RESULT   ALT 14 0 - 44 U/L    Comment: HEMOLYSIS AT THIS LEVEL MAY AFFECT RESULT   Alkaline Phosphatase 73 38 - 126 U/L   Total Bilirubin 1.6 (H) 0.3 - 1.2 mg/dL    Comment: HEMOLYSIS AT THIS LEVEL MAY  AFFECT RESULT   GFR, Estimated 58 (L) >60 mL/min    Comment: (NOTE) Calculated using the CKD-EPI Creatinine Equation (2021)    Anion gap 13 5 - 15    Comment: Performed at [facility], [address], [place], [place] [address]  CBC     Status: Abnormal   Collection Time: 02/16/23  6:38 AM  Result Value Ref Range   WBC 7.7 4.0 - 10.5 K/uL   RBC 4.34 4.22 - 5.81 MIL/uL   Hemoglobin 12.7 (L) 13.0 - 17.0 g/dL   HCT 11.9 (L) 14.7 - 82.9 %   MCV 89.2 80.0 - 100.0 fL   MCH 29.3 26.0 - 34.0 pg   MCHC 32.8 30.0 - 36.0 g/dL   RDW 56.2 13.0 - 86.5 %   Platelets 323 150 -  400 K/uL   nRBC 0.0 0.0 - 0.2 %    Comment: Performed at [facility], [address] [place], [place] [address]  Lipase, blood     Status: None   Collection Time: 02/16/23  6:38 AM  Result Value Ref Range   Lipase 23 11 - 51 U/L    Comment: Performed at [facility], [address] [place], [place] [address]  Brain natriuretic peptide     Status: Abnormal   Collection Time: 02/16/23  6:38 AM  Result Value Ref Range   B Natriuretic Peptide 3,511.2 (H) 0.0 - 100.0 pg/mL    Comment: Performed at [facility], [address], [place], [place] [address]  Magnesium     Status: None   Collection Time: 02/16/23  6:38 AM  Result Value Ref Range   Magnesium 2.1 1.7 - 2.4 mg/dL    Comment: Performed at [facility], [address], [place], [place] [address]  Rapid urine drug screen (hospital performed)     Status: Abnormal   Collection Time: 02/16/23  8:22 AM  Result Value Ref Range   Opiates NONE DETECTED NONE DETECTED   Cocaine NONE DETECTED NONE DETECTED   Benzodiazepines NONE DETECTED NONE DETECTED   Amphetamines NONE DETECTED NONE DETECTED   Tetrahydrocannabinol POSITIVE (A) NONE DETECTED   Barbiturates NONE DETECTED NONE DETECTED    Comment: (NOTE) DRUG SCREEN FOR MEDICAL PURPOSES ONLY.  IF CONFIRMATION IS NEEDED FOR ANY PURPOSE, NOTIFY LAB WITHIN 5 DAYS.  LOWEST DETECTABLE  LIMITS FOR URINE DRUG SCREEN Drug Class                     Cutoff (ng/mL) Amphetamine and metabolites    1000 Barbiturate and metabolites    200 Benzodiazepine                 200 Opiates and metabolites        300 Cocaine and metabolites        300 THC                            50 Performed at [facility], [address], [place], [place] [address]    DG Chest 2 View  Result Date: 02/16/2023 CLINICAL DATA:  Shortness of breath. EXAM: CHEST - 2 VIEW COMPARISON:  01/22/19 for FINDINGS: Cardiac enlargement is again noted. No signs of pleural effusion or interstitial edema. No airspace opacities identified. The visualized osseous structures are unremarkable. IMPRESSION: Cardiac enlargement. No acute findings. Electronically Signed   By: Signa Kell M.D.   On: 02/16/2023 07:11    Pending Labs Unresulted Labs (From admission, onward)     Start     Ordered   02/23/23 0500  Creatinine, serum  (enoxaparin (LOVENOX)    CrCl >/= 30 ml/min)  Weekly,   R     Comments: while on enoxaparin therapy    02/16/23 0903   02/17/23 0500  Basic metabolic panel  Tomorrow morning,   R        02/16/23 0903   02/17/23 0500  CBC  Tomorrow morning,   R        02/16/23 0903            Vitals/Pain Today's Vitals   02/16/23 1306 02/16/23 1310 02/16/23 1522 02/16/23 1613  BP: (!) 125/93   123/73  Pulse: 93 90  88  Resp: (!) 30 (!) 22  (!) 22  Temp:   97.8 F (36.6 C) 98 F (36.7 C)  TempSrc:   Oral Oral  SpO2: 97% 99%  100%  Weight:      Height:      PainSc:    0-No pain    Isolation Precautions No active isolations  Medications Medications  enoxaparin (LOVENOX) injection 40 mg (40 mg Subcutaneous Not Given 02/16/23 1013)  aspirin EC tablet 81 mg (81 mg Oral Given 02/16/23 1011)  atorvastatin (LIPITOR) tablet 40 mg (40 mg Oral Given 02/16/23 1011)  digoxin (LANOXIN) tablet 125 mcg (125 mcg Oral Given 02/16/23 1011)  dapagliflozin propanediol (FARXIGA) tablet 10 mg (10 mg Oral  Given 02/16/23 1011)  potassium chloride SA (KLOR-CON M) CR tablet 40 mEq (40 mEq Oral Given 02/16/23 1011)  furosemide (LASIX) injection 60 mg (60 mg Intravenous Given 02/16/23 1614)  ipratropium-albuterol (DUONEB) 0.5-2.5 (3) MG/3ML nebulizer solution 3 mL (3 mLs Nebulization Given 02/16/23 0655)  methylPREDNISolone sodium succinate (SOLU-MEDROL) 125 mg/2 mL injection 125 mg (125 mg Intravenous Given 02/16/23 0656)  ondansetron (ZOFRAN) injection 4 mg (4 mg Intravenous Given 02/16/23 0655)  furosemide (LASIX) injection 60 mg (60 mg Intravenous Given 02/16/23 0801)    Mobility walks     Focused Assessments Cardiac Assessment Handoff:  Cardiac Rhythm: Normal sinus rhythm Lab Results  Component Value Date   CKTOTAL 82 10/17/2020   TROPONINI 0.03 (HH) 04/11/2019   Lab Results  Component Value Date   DDIMER 0.70 (H) 08/24/2020   Does the Patient currently have chest pain? No    R Recommendations: See Admitting Provider Note  Report given to:   Additional Notes: CHF exacerbation. A/O x 4, continent b/b, ambulatory w standby assist

## 2023-02-16 NOTE — Consult Note (Addendum)
Cardiology Consultation   Patient ID: Dale [name] MRN: [id number]; DOB: Jun 27, 1961  Admit date: 02/16/2023 Date of Consult: 02/16/2023  PCP:  Dale [name]   [facility] Providers Cardiologist:  None     Patient Profile:   Dale [name] is a 62 y.o. male with a hx of chronic HFrEF (mixed ischemic and nonischemic), polysubstance abuse, alcohol abuse, hypertension who is being seen 02/16/2023 for the evaluation of CHF at the request of Dr. Mayford [name].  History of Present Illness:   Dale [name] is a 62 year old male with above medical history.  Most recently, patient has been followed by our advanced heart failure clinic.  Patient has had a severely reduced ejection fraction dating back to 2019 when EF was found to be 20-25%.  Repeat echocardiogram on 03/11/2027 showed EF 20-25%, LV diastolic parameters consistent with restrictive filling, elevated LVEDP.  There was diffuse hypokinesis that was worse in the inferior wall.  At that time, patient was told he would need a heart catheterization.  However, patient wished to defer this study.  He ultimately underwent a right/left heart catheterization on 05/10/2019 that showed severe single-vessel CAD with chronic total occlusion of the proximal RCA.  Also showed mild-moderate nonobstructive CAD involving the LAD and left circumflex.  It was recommended that patient be treated with medical therapy of mixed ischemic and nonischemic cardiomyopathy.  More recently, patient was admitted to the hospital in 02/2022 for evaluation of CHF.  It was noted that patient had run out of his heart failure medications 30-day prior to presentation.  He had been drinking significant amount of alcohol daily and had last used cocaine 9 months prior.  At that time, patient was couch surfing or living in a motel and had difficulty getting to his appointments.  Echocardiogram on 02/19/2022 showed EF 10-15% with global hypokinesis, severely reduced RV systolic  function, mildly elevated pulmonary artery systolic pressure, severe biatrial enlargement, mild-moderate mitral valve regurgitation.  He was markedly volume overloaded on exam and he had poor response to initial IV Lasix.  There was concern for low output heart failure.  Patient refused PICC line for Choloxin CVP monitoring.  He was started on empiric milrinone to facilitate diuresis.  It was noted that patient was not a candidate for advanced therapies due to his homelessness and substance abuse.  Patient refused cardiac catheterization at admission.  After being discharged, he was seen in clinic by Dr. Shirlee [name], and he agreed to cardiac catheterization.  Right/left heart catheterization on 03/18/2022 showed [name] change in coronaries compared to prior cath (totally occluded proximal RCA with collateral flow, nonobstructive disease in the left system).  There were markedly elevated filling pressures and pulmonary venous hypertension.  Confirmed low cardiac output.  Patient was admitted to the hospital and started on milrinone and diuretics.  He was discharged on aspirin, atorvastatin, dapagliflozin, BiDil, digoxin, torsemide, potassium supplement.  Patient was seen in the ED on 01/22/2023.  Patient reported that he had run out of his heart failure medications and had not been taking them for a few days.  He was given IV Lasix in the ED with improvement.  He was able to be discharged from the ED.  Was last seen by the advanced heart failure team on 01/28/2023.  He was treated with torsemide 60 mg twice daily, digoxin 0.125 mg daily, Farxiga 10 mg daily.  Patient was not a candidate for ICD due to his alcohol use.  He was not a good candidate for home  milrinone.  Patient presented to the ED on 02/16/23 complaining of nausea, vomiting, and difficulty breathing. He reported that for the past month, he had been having intermittent episodes of nausea and vomiting, which has made it difficult for him to keep his medications  down. He has also had difficulty breathing and a cough. Labs in the ED significant for K 4.1, Na 138, creatinine 1.40, AST 25, ALT 14, WBC 7.7, hemoglobin 12.7, platelets 323. BNP elevated to 3511.2. COVID, Flu, RSV negative. CXR showed cardiac enlargement without acute findings. Cardiology consulted for CHF management\  On interview, patient reports that he has been having nausea and vomiting every day for the past month. This is very concerning to him because he had a friend who was diagnosed with cancer after developing similar symptoms. In the past week, he has also noticed worsening shortness of breath. He does have shortness of breath at baseline, but has been noticing himself getting short of breath while at rest. Also has orthopnea, ankle edema. He has been taking his medications intermittently. Sometimes he forgets to take his medicines, but sometimes he vomits them up after he takes them. He drinks about 1 pint of malt liquor per day and smokes marijuana. [name] tobacco use, and he has not used cocaine in the past 2 years. After receiving IV lasix, he has urinated 2 times in the ED. However, he did not produce much urine either time.   Past Medical History:  Diagnosis Date   Alcohol abuse    CAD (coronary artery disease)    a. 05/2019 Cath: LM nl, LAD 15p, 88m, LCX large/nl, OM2 30, OM3 20, RCA 100 CTO w/ bridging L->R collats to RPDA.   Chronic combined systolic and diastolic CHF (congestive heart failure)    a. 09/27/18 Echo: EF 20-25%, grade 2 DD; b. 03/2019 Echo: EF 20-25%; c. 05/2021 Echo: EF <10%, glob HK. GrII DD. Sev red RV fxn. Sev BAE. Mild MR. Mild-mod TR.   CKD (chronic kidney disease), stage II    Cocaine abuse    Hyperlipidemia LDL goal <70    Hypertension    Ischemic cardiomyopathy    a. 09/27/18 Echo: EF 20-25%, grade 2 DD; b. 03/2019 Echo: EF 20-25%; c. 05/2019 Cath: RCA 100 CTA, otw nonobs dzs; d. 05/2021 Echo: EF <10%, glob HK. GrII DD. Sev red RV fxn.   Morbid obesity     Noncompliance    Normocytic anemia     Past Surgical History:  Procedure Laterality Date   RIGHT/LEFT HEART CATH AND CORONARY ANGIOGRAPHY N/A 05/10/2019   Procedure: RIGHT/LEFT HEART CATH AND CORONARY ANGIOGRAPHY;  Surgeon: Yvonne Kendall, MD;  Location: [facility] INVASIVE CV LAB;  Service: Cardiovascular;  Laterality: N/A;   RIGHT/LEFT HEART CATH AND CORONARY ANGIOGRAPHY N/A 03/18/2022   Procedure: RIGHT/LEFT HEART CATH AND CORONARY ANGIOGRAPHY;  Surgeon: Laurey Morale, MD;  Location: [facility] INVASIVE CV LAB;  Service: Cardiovascular;  Laterality: N/A;     Home Medications:  Prior to Admission medications   Medication Sig Start Date End Date Taking? Authorizing Provider  aspirin EC 81 MG tablet Take 1 tablet (81 mg total) by mouth daily. Swallow whole. 01/28/23   Clegg, Amy D, NP  atorvastatin (LIPITOR) 40 MG tablet TAKE ONE TABLET BY MOUTH ONCE DAILY FOR CHOLESTEROL Patient taking differently: Take 40 mg by mouth daily. 06/27/22   Laurey Morale, MD  digoxin (LANOXIN) 0.125 MG tablet TAKE ONE TABLET BY MOUTH ONCE DAILY 06/27/22   Laurey Morale, MD  [name]  10 MG TABS tablet TAKE ONE TABLET BY MOUTH ONCE DAILY 06/27/22   Laurey Morale, MD  potassium chloride SA (KLOR-CON M) 20 MEQ tablet Take 2 tablets (40 mEq total) by mouth 2 (two) times daily. 10/18/22   Milford, Anderson Malta, FNP  torsemide (DEMADEX) 20 MG tablet Take 3 tablets (60 mg total) by mouth 2 (two) times daily. 10/18/22 10/13/23  Jacklynn Ganong, FNP    Inpatient Medications: Scheduled Meds:  aspirin EC  81 mg Oral Daily   atorvastatin  40 mg Oral Daily   dapagliflozin propanediol  10 mg Oral Daily   digoxin  125 mcg Oral Daily   enoxaparin (LOVENOX) injection  40 mg Subcutaneous Q24H   potassium chloride SA  40 mEq Oral BID   torsemide  60 mg Oral BID   Continuous Infusions:  PRN Meds:   Allergies:    Allergies  Allergen Reactions   Hydrocodone Itching    Social History:   Social History   Socioeconomic  History   Marital status: Single    Spouse name: none   Number of children: Not on file   Years of education: Not on file   Highest education level: Not on file  Occupational History   Occupation: Disabled  Tobacco Use   Smoking status: Never   Smokeless tobacco: Never  Vaping Use   Vaping Use: Never used  Substance and Sexual Activity   Alcohol use: Yes    Comment: 1 quarts a week   Drug use: Not Currently    Frequency: 3.0 times per week    Types: Marijuana, Cocaine   Sexual activity: Not Currently  Other Topics Concern   Not on file  Social History Narrative   Lives w/ mother in [place] but recently visiting w/ dtr in [place].   Social Determinants of Health   Financial Resource Strain: High Risk (02/20/2022)   Overall Financial Resource Strain (CARDIA)    Difficulty of Paying Living Expenses: Hard  Food Insecurity: Food Insecurity Present (04/19/2022)   Hunger Vital Sign    Worried About Running Out of Food in the Last Year: Sometimes true    Ran Out of Food in the Last Year: Sometimes true  Transportation Needs: Unmet Transportation Needs (05/03/2022)   PRAPARE - Administrator, Civil Service (Medical): Yes    Lack of Transportation (Non-Medical): Yes  Physical Activity: Not on file  Stress: Not on file  Social Connections: Not on file  Intimate Partner Violence: Not on file    Family History:    Family History  Problem Relation Age of Onset   Hypertension Maternal Grandmother      ROS:  Please see the history of present illness.   All other ROS reviewed and negative.     Physical Exam/Data:   Vitals:   02/16/23 0435 02/16/23 0443 02/16/23 0707 02/16/23 0745  BP: (!) 121/99  (!) 130/94   Pulse: 89  89   Resp: 20  20   Temp: 98.9 F (37.2 C)   98.7 F (37.1 C)  TempSrc: Oral   Oral  SpO2: 95%  95%   Weight:  106.6 kg    Height:   (1.803 m)     [name] intake or output data in the 24 hours ending 02/16/23 1017    02/16/2023    4:43 AM  01/28/2023    8:24 AM 01/22/2023    4:18 AM  Last 3 Weights  Weight (lbs) 235 lb 243 lb  6.4 oz 265 lb  Weight (kg) 106.595 kg 110.406 kg 120.203 kg     Body mass index is 32.78 kg/m.  General:  Well nourished, well developed, in [name] acute distress. Sitting upright on the side of the bed. Breathing unlabored  HEENT: normal Neck: [name] JVD Vascular: Radial pulses 2+ bilaterally Cardiac:  normal S1, S2; RRR; [name] murmur  Lungs:  Expiratory wheezing throughout. Some crackles in lung bases  Abd: soft, nontender Ext: Trace edema in BLE Musculoskeletal:  [name] deformities, BUE and BLE strength normal and equal Skin: warm and dry  Neuro:  CNs 2-12 intact, [name] focal abnormalities noted Psych:  Normal affect   EKG:  The EKG was personally reviewed and demonstrates:  Sinus rhythm, HR 92 BPM, LBBB  Telemetry:  Telemetry was personally reviewed and demonstrates:  NSR   Relevant CV Studies:   Laboratory Data:  High Sensitivity Troponin:   Recent Labs  Lab 01/22/23 0428 01/22/23 0645  TROPONINIHS 45* 42*     Chemistry Recent Labs  Lab 02/16/23 0638  NA 138  K 4.1  CL 102  CO2 23  GLUCOSE 101*  BUN 26*  CREATININE 1.40*  CALCIUM 8.5*  MG 2.1  GFRNONAA 58*  ANIONGAP 13    Recent Labs  Lab 02/16/23 0638  PROT 7.1  ALBUMIN 3.3*  AST 25  ALT 14  ALKPHOS 73  BILITOT 1.6*   Lipids [name] results for input(s): "CHOL", "TRIG", "HDL", "LABVLDL", "LDLCALC", "CHOLHDL" in the last 168 hours.  Hematology Recent Labs  Lab 02/16/23 0638  WBC 7.7  RBC 4.34  HGB 12.7*  HCT 38.7*  MCV 89.2  MCH 29.3  MCHC 32.8  RDW 15.5  PLT 323   Thyroid [name] results for input(s): "TSH", "FREET4" in the last 168 hours.  BNP Recent Labs  Lab 02/16/23 0638  BNP 3,511.2*    DDimer [name] results for input(s): "DDIMER" in the last 168 hours.   Radiology/Studies:  DG Chest 2 View  Result Date: 02/16/2023 CLINICAL DATA:  Shortness of breath. EXAM: CHEST - 2 VIEW COMPARISON:  01/22/19 for FINDINGS:  Cardiac enlargement is again noted. [name] signs of pleural effusion or interstitial edema. [name] airspace opacities identified. The visualized osseous structures are unremarkable. IMPRESSION: Cardiac enlargement. [name] acute findings. Electronically Signed   By: Signa Kell M.D.   On: 02/16/2023 07:11     Assessment and Plan:   Acute on chronic HFrEF Combined Ischemic and Nonischemic Cardiomyopathy  RV failure  - Patient has had a significantly reduced EF since 2019. Most recent echocardiogram from 02/19/22 showed EF 10-15% with severe LV dilation, severely reduced RV systolic function, severe biatrial enlargement, mild-moderate mitral valve regurgitation  - Heart catheterization from 03/2022 showed CTO of the proximal RCA with collateral flow, nonobstructive disease in the left system  - Patient has been followed by the advanced heart failure clinic- per their notes, patient does have low cardiac output. He is not a candidate for ICD because he continues to drink alcohol. Also not a candidate for advanced therapies or home milrinone due to homelessness and substance abuse  - Home CHF regiment includes torsemide 60 mg BID, digoxin 0.125 mg daily, farxiga 10 mg daily. He has been intermittently compliant with medications, but often forgets to take it or vomits his medications up  - He is mildly volume overloaded in the ED (trace ankle edema, some crackles in lung bases). Already received IV lasix 60 mg in the ED and was given PO torsemide 60 mg  -  Follow strict I/Os, daily weights. Can give additional IV lasix as needed  - Resume home heart failure medications  - BP has been stable in the ED. [name] signs of cardiogenic shock  - Educated patient on the importance of alcohol cessation and medication compliance   CAD  - As above, cath from 2023 showed CTO of the proximal RCA and nonobstructive disease in left system  - [name] chest pain or anginal symptoms  - Continue daily ASA - Continue lipitor    Risk  Assessment/Risk Scores:    New York Heart Association (NYHA) Functional Class NYHA Class IV      For questions or updates, please contact [facility] Please consult [email or web address] for contact info under    Signed, Jonita Albee, PA-C  02/16/2023 10:17 AM  I have seen and examined this patient with Robet Leu.  Agree with above, note added to reflect my findings.  Given the history of chronic systolic heart failure, polysubstance abuse, alcohol abuse, hypertension presented to the hospital with nausea vomiting and shortness of breath.  He has a longstanding history of heart failure with multiple emergency room and hospital visits.  He been having nausea and vomiting for the last month.  He is also been having orthopnea and lower extremity.  He has received IV Lasix in the emergency room with improvement in his symptoms.  He states that his respiratory status he has a better.  He is not on oxygen.  He has been somewhat compliant with his heart failure medications, missing a few doses as he states that he has been difficult to get his medications.  GEN: Well nourished, well developed, in [name] acute distress  HEENT: normal  Neck: [name] JVD, carotid bruits, or masses Cardiac: RRR; [name] murmurs, rubs, or gallops,[name] edema  Respiratory:  clear to auscultation bilaterally, normal work of breathing GI: soft, nontender, nondistended, + BS MS: [name] deformity or atrophy  Skin: warm and dry Neuro:  Strength and sensation are intact Psych: euthymic mood, full affect   Acute on chronic systolic heart failure: Due to combined ischemic and nonischemic cardiomyopathy.  He has a proximal RCA occlusion with collateral flow.  At this point, would resume all his heart failure medications.  Agree with IV Lasix for now.  Dale [name] closely monitor his creatinine. Coronary artery disease: CTO of the RCA.  [name] current chest pain.  Continue aspirin and Lipitor. Nausea/vomiting: Plan per primary team.  Dale [name]  M. Tierra Divelbiss MD 02/16/2023 12:05 PM

## 2023-02-16 NOTE — H&P (Cosign Needed Addendum)
Date: 02/16/2023               Patient Name:  Dale [name] MRN: [id number]  DOB: Mar 05, 1961 Age / Sex: 62 y.o., male   PCP: Pcp, No         Medical Service: Internal Medicine Teaching Service         Attending Physician: Dr. Mayford Knife, Dorene Ar, MD    First Contact: Karoline Caldwell, MD      Pager: [phone]      Second Contact: Quincy Simmonds, MD      Pager: Eustaquio Maize [phone]           After Hours (After 5p/  First Contact Pager: [phone]  weekends / holidays): Second Contact Pager: [phone]   SUBJECTIVE   Chief Complaint: N/V and SOB with exertion  History of Present Illness: Dale [name] is a 62 y/o with a PMHx of CHF with EF of 10-15%, CKD, HTN and polysubstance use who presented with 1 month history of daily nausea and nonbloody nonbillious vomiting. At first, he thought he had food poisoning, however when it kept occurring for several weeks, this prompted him to seek medical care. This N/V usually occurs after eating and is accompanied by burning sternum pain that feels like "it's burning a hole in my stomach." He says he has been taking his friend's GERD pills for the past week and that this has provided relief in his burning sternum pain, and mild improvement in N/V, though this does persist. He has also noticed  occasional darker stools, but does not describe stools as black. He is moving his bowels normally still. He does not use pepto bismol or iron supplements. Stool is non-bloody. He thinks he has lost 20lbs over the last month from not being able to keep anything down, but also says that he was being treated for CHF exacerbation when his weight was higher. There is no pain in his abdomen, nor with vomiting or bowel movements.   Patient denies runny nose, fever, recent diet changes, chest pain, and sweatiness. Of note, he has been getting more SOB recently. He has has difficulty sleeping because of this SOB and also difficulty with getting around the house. He has to "stop  while washing the dishes to catch my breath.". He says he is usually compliant with his medications, but will miss doses 2 times per week. He denies any abrupt change in his shortness of breath, says that this has been gradual decline over past 2 years.   Meds:  Aspirin  daily Atorvastatin  daily Digoxin 0.125mg  daily Farxiga  daily Potassium chloride tablet twice daily Torsemide  BID  Past Medical History  Past Surgical History:  Procedure Laterality Date   RIGHT/LEFT HEART CATH AND CORONARY ANGIOGRAPHY N/A 05/10/2019   Procedure: RIGHT/LEFT HEART CATH AND CORONARY ANGIOGRAPHY;  Surgeon: Yvonne Kendall, MD;  Location: [facility] INVASIVE CV LAB;  Service: Cardiovascular;  Laterality: N/A;   RIGHT/LEFT HEART CATH AND CORONARY ANGIOGRAPHY N/A 03/18/2022   Procedure: RIGHT/LEFT HEART CATH AND CORONARY ANGIOGRAPHY;  Surgeon: Laurey Morale, MD;  Location: [facility] INVASIVE CV LAB;  Service: Cardiovascular;  Laterality: N/A;    Social:  Lives With: none Occupation: none Support: none Level of Function: independent in ADLs/IADLS (more challenging recently) Substances: marijuana, etoh, prior cocaine, no tobacco  Family History:  Family History  Problem Relation Age of Onset   Hypertension Maternal Grandmother   CVA, MI, DM  Allergies: Allergies as of 02/16/2023 - Review Complete 02/16/2023  Allergen Reaction Noted   Hydrocodone Itching 03/11/2016    Review of Systems: A complete ROS was negative except as per HPI.   OBJECTIVE:   Physical Exam: Blood pressure (!) 125/93, pulse 90, temperature 97.8 F (36.6 C), temperature source Oral, resp. rate (!) 22, height 5\' 11"  (1.803 m), weight 106.6 kg, SpO2 99 %.  Constitutional:  in no acute distress Cardiovascular: RRR, no murmurs, rubs or gallops, no JVD, trace bilateral edema midshin, no crackles Pulmonary/Chest: normal work of breathing on room air, lungs clear to auscultation bilaterally, significant transmitted upper  airway sounds, no rhonchi Abdominal: soft, non-tender, non-distended Extremities: warm, well perfused, extremity pulses 2+  Labs:    Latest Ref Rng & Units 02/16/2023    6:38 AM 01/22/2023    4:28 AM 03/18/2022   12:14 PM 02/22/2022    4:04 AM 02/21/2022    4:54 AM 02/20/2022    4:36 AM 02/19/2022    3:19 AM  CBC EXTENDED  WBC 4.0 - 10.5 K/uL 7.7  7.1   6.6  7.3  6.5  8.3   RBC 4.22 - 5.81 MIL/uL 4.34  4.40   4.05  3.73  3.71  3.90   Hemoglobin 13.0 - 17.0 g/dL 00.9  23.3  00.7    62.2  12.8  11.9  11.7  12.4   HCT 39.0 - 52.0 % 38.7  38.3  38.0    38.0  37.2  34.5  35.2  37.1   Platelets 150 - 400 K/uL 323  281   257  227  229  245   NEUT# 1.7 - 7.7 K/uL  5.7      6.9   Lymph# 0.7 - 4.0 K/uL  0.8      0.6       Latest Ref Rng & Units 02/16/2023    6:38 AM 01/28/2023    8:38 AM 01/22/2023    4:28 AM 11/20/2022    2:26 PM 10/18/2022    9:40 AM 09/10/2022    8:55 AM 08/30/2022    2:56 PM  CMP  Glucose 70 - 99 mg/dL 633  99  354  88  95  94  76   BUN 6 - 20 mg/dL 26  23  27  16  17  22  18    Creatinine 0.61 - 1.24 mg/dL 5.62  5.63  8.93  7.34  1.61  1.25  1.27   Sodium 135 - 145 mmol/L 138  138  140  140  141  139  142   Potassium 3.5 - 5.1 mmol/L 4.1  3.9  3.6  3.9  3.8  3.9  3.5   Chloride 98 - 111 mmol/L 102  97  102  102  103  103  108   CO2 22 - 32 mmol/L 23  28  22  27  31  27  28    Calcium 8.9 - 10.3 mg/dL 8.5  8.7  8.7  8.2  7.9  8.5  7.5   Total Protein 6.5 - 8.1 g/dL 7.1       5.2   Total Bilirubin 0.3 - 1.2 mg/dL 1.6       0.6   Alkaline Phos 38 - 126 U/L 73       55   AST 15 - 41 U/L 25       15   ALT 0 - 44 U/L 14       12     Imaging: CXR:  IMPRESSION: Cardiac enlargement. No acute findings.  EKG: personally reviewed my interpretation is sinus rhythm with 1st degree AV Block.   ASSESSMENT & PLAN:   Assessment & Plan by Problem: Principal Problem:   Intractable vomiting   Dale [name] is a 62 y/o with a PMHx of CHF with EF of 10-15%, CKD, HTN and  polysubstance use who presented with 1 month history of daily nausea and nonbloody nonbillious vomiting and admitted for intractable vomiting on hospital day 0  #Intractable Vomiting Patient's vomiting sounds most suspicious for GERD since his symptoms did improve some with reflux medications. Of note, patient using marijuana so cannabinoid hyperemesis is in differential as well. A1c normal so doubt gastroparesis. No pain or blood so unlikely to be gastritis or obstruction. Still having normal bowel function so ileus/SBO unlikely too. Timeline does not fit with viral gastroenteritis and no signs to point towards infection. Lipase and lack of pain excludes pancreatitis. Transaminase make liver etiology unlikely and normal ALP makes gallbladder disease unlikely.  Importantly, patient was started on CLD initially, but he requested regular and has been tolerating this without issue. His nausea and vomiting doe not show the metabolic disturbances I would expect if severe such as hypochloremic hypokalemic metabolic alkalosis. Overall, seems to be doing stableand may be able to DC tomorrow with PCP f/u.  - we will trial PPI  #HFrEF Patient's recent echo demonstrated an EF of 10-15%. He present with SOB on exertion that he endorses has gradually worsened over past 2 years. I do not think this is an acute exacerbation of his HFrEF, but rather a chronic progression. He endorses missing at least 2 doses of his torsemide and GDMT weekly and his BNP is elevated, but does not appear volume overloaded to me on exam - no edema in lower legs, no lung crackles, weight near reported baseline, CXR without acute change, no supplemental O2 requirement.  Low concern for acute exacerbation so we will restart his GDMT and continue diuresis.  Cardiology was consulted by EDPA, appreciate their assistance.  - daily weight - I+O - IV lasix, likely home torsemide tomorrow - K replacement  #CKD - baseline - bmp in AM  #HTN BP  has been soft on admission with systolics in 120-130s. Will continue his [name] as per his GDMT for HFrEF.  # polysubstance use TOC consult for resources Consider naltrexone   Diet: Normal VTE: Enoxaparin IVF: None Code: Full  Prior to Admission Living Arrangement: Home, living friend Anticipated Discharge Location: Home Barriers to Discharge: resolution of vomiting  Dispo: Admit patient to Observation with expected length of stay less than 2 midnights.  Signed: Syliva Overman Medical Student  02/16/2023, 7:45 PM    Attestation for Student Documentation:  I personally was present and performed or re-performed the history, physical exam and medical decision-making activities of this service and have verified that the service and findings are accurately documented in the student's note.  Adron Bene, MD 02/16/2023, 8:03 PM

## 2023-02-16 NOTE — ED Provider Notes (Addendum)
[place] EMERGENCY DEPARTMENT AT [facility] Provider Note   CSN: [id number] Arrival date & time: 02/16/23  0424     History  Chief Complaint  Patient presents with   Cough    Dale [name] is a 62 y.o. male history includes polysubstance abuse, alcohol abuse, hyperlipidemia, hypertension, ischemic cardiomyopathy, CHF with most recent EF 10-15%, obesity, CKD.  Patient presents to the ER for evaluation of nausea, vomiting and difficulty breathing.  Patient reports that over the past month he has had intermittent nausea and nonbloody/nonbilious emesis and difficulty keeping down his medications.  In addition to this he has been having difficulty breathing and cough.  Symptoms have been continuous since onset and gradually worsening.  He received a DuoNeb by previous provider and reports minimal relief of symptoms.  Patient denies measured fever at home.  He denies chest pain, hemoptysis or history of DVT/PE.  He denies any extremity swelling or associated abdominal pain.  He denies any additional concerns at this time. HPI     Home Medications Prior to Admission medications   Medication Sig Start Date End Date Taking? Authorizing Provider  aspirin EC 81 MG tablet Take 1 tablet (81 mg total) by mouth daily. Swallow whole. 01/28/23   Clegg, Amy D, NP  atorvastatin (LIPITOR) 40 MG tablet TAKE ONE TABLET BY MOUTH ONCE DAILY FOR CHOLESTEROL Patient taking differently: Take 40 mg by mouth daily. 06/27/22   Laurey Morale, MD  digoxin (LANOXIN) 0.125 MG tablet TAKE ONE TABLET BY MOUTH ONCE DAILY 06/27/22   Laurey Morale, MD  FARXIGA 10 MG TABS tablet TAKE ONE TABLET BY MOUTH ONCE DAILY 06/27/22   Laurey Morale, MD  potassium chloride SA (KLOR-CON M) 20 MEQ tablet Take 2 tablets (40 mEq total) by mouth 2 (two) times daily. 10/18/22   Milford, Anderson Malta, FNP  torsemide (DEMADEX) 20 MG tablet Take 3 tablets (60 mg total) by mouth 2 (two) times daily. 10/18/22 10/13/23   Jacklynn Ganong, FNP      Allergies    Hydrocodone    Review of Systems   Review of Systems Ten systems are reviewed and are negative for acute change except as noted in the HPI  Physical Exam Updated Vital Signs BP (!) 130/94 (BP Location: Left Arm)   Pulse 89   Temp 98.7 F (37.1 C) (Oral)   Resp 20   Ht 5\' 11"  (1.803 m)   Wt 106.6 kg   SpO2 95%   BMI 32.78 kg/m  Physical Exam Constitutional:      General: He is not in acute distress.    Appearance: Normal appearance. He is well-developed. He is not ill-appearing or diaphoretic.  HENT:     Head: Normocephalic and atraumatic.  Eyes:     General: Vision grossly intact. Gaze aligned appropriately.     Pupils: Pupils are equal, round, and reactive to light.  Neck:     Trachea: Trachea and phonation normal.  Cardiovascular:     Rate and Rhythm: Normal rate and regular rhythm.  Pulmonary:     Effort: Pulmonary effort is normal. No respiratory distress.     Breath sounds: Wheezing present.  Abdominal:     General: There is no distension.     Palpations: Abdomen is soft.     Tenderness: There is no abdominal tenderness. There is no guarding or rebound.  Musculoskeletal:        General: Normal range of motion.     Cervical  back: Normal range of motion.     Right lower leg: No edema.     Left lower leg: No edema.  Skin:    General: Skin is warm and dry.  Neurological:     Mental Status: He is alert.     GCS: GCS eye subscore is 4. GCS verbal subscore is 5. GCS motor subscore is 6.     Comments: Speech is clear and goal oriented, follows commands Major Cranial nerves without deficit, no facial droop Moves extremities without ataxia, coordination intact  Psychiatric:        Behavior: Behavior normal.     ED Results / Procedures / Treatments   Labs (all labs ordered are listed, but only abnormal results are displayed) Labs Reviewed  COMPREHENSIVE METABOLIC PANEL - Abnormal; Notable for the following components:       Result Value   Glucose, Bld 101 (*)    BUN 26 (*)    Creatinine, Ser 1.40 (*)    Calcium 8.5 (*)    Albumin 3.3 (*)    Total Bilirubin 1.6 (*)    GFR, Estimated 58 (*)    All other components within normal limits  CBC - Abnormal; Notable for the following components:   Hemoglobin 12.7 (*)    HCT 38.7 (*)    All other components within normal limits  BRAIN NATRIURETIC PEPTIDE - Abnormal; Notable for the following components:   B Natriuretic Peptide 3,511.2 (*)    All other components within normal limits  RESP PANEL BY RT-PCR (RSV, FLU A&B, COVID)  RVPGX2  LIPASE, BLOOD  MAGNESIUM  RAPID URINE DRUG SCREEN, HOSP PERFORMED    EKG None  Radiology DG Chest 2 View  Result Date: 02/16/2023 CLINICAL DATA:  Shortness of breath. EXAM: CHEST - 2 VIEW COMPARISON:  01/22/19 for FINDINGS: Cardiac enlargement is again noted. No signs of pleural effusion or interstitial edema. No airspace opacities identified. The visualized osseous structures are unremarkable. IMPRESSION: Cardiac enlargement. No acute findings. Electronically Signed   By: Signa Kell M.D.   On: 02/16/2023 07:11    Procedures .Critical Care  Performed by: Bill Salinas, PA-C Authorized by: Bill Salinas, PA-C   Critical care provider statement:    Critical care time (minutes):  31   Critical care was necessary to treat or prevent imminent or life-threatening deterioration of the following conditions: CHF exacerbation requiring IV diuretics and hospital admission.   Critical care was time spent personally by me on the following activities:  Development of treatment plan with patient or surrogate, discussions with consultants, evaluation of patient's response to treatment, examination of patient, ordering and review of laboratory studies, ordering and review of radiographic studies, ordering and performing treatments and interventions, pulse oximetry, re-evaluation of patient's condition, review of old charts  and blood draw for specimens     Medications Ordered in ED Medications  ipratropium-albuterol (DUONEB) 0.5-2.5 (3) MG/3ML nebulizer solution 3 mL (3 mLs Nebulization Given 02/16/23 0655)  methylPREDNISolone sodium succinate (SOLU-MEDROL) 125 mg/2 mL injection 125 mg (125 mg Intravenous Given 02/16/23 0656)  ondansetron (ZOFRAN) injection 4 mg (4 mg Intravenous Given 02/16/23 0655)  furosemide (LASIX) injection 60 mg (60 mg Intravenous Given 02/16/23 0801)    ED Course/ Medical Decision Making/ A&P Clinical Course as of 02/16/23 0902  Sun Feb 16, 2023  0830 This is a 62 year old male with a history of advanced congestive heart failure with an EF of 10 to 15%, on 60 mg Lasix twice daily, presented  to ED with shortness of breath, also persistent vomiting and poor p.o. intake that he reports for about a month.  He does smoke marijuana daily, and there may be some component of cannabis hyperemesis syndrome, but he says that he feels nauseated every time he tries to eat.  He also has dry heaving during his coughing spells, which are noted here in the ED.  He was initially given a breathing treatment of DuoNeb for concern for wheezing on arrival, although he denies any known history of COPD or breathing treatment necessary at home.  On my evaluation I do not appreciate any wheezing, but I do see on bedside ultrasound B-lines consistent with pulmonary edema, and a BNP which is still significantly elevated at 3500, and I suspect that likely he is having dyspnea from his heart failure.  Instead we will give IV diuresis with 60 mg of Lasix.  He is stable at the moment on room air, but with his persistent vomiting and poor p.o. intake, inability to tolerate some medications, we will plan for medical admission.  I did discuss with the patient the possibility of cannabis hyperemesis syndrome and advised that he discontinue smoking marijuana. [MT]    Clinical Course User Index [MT] Terald Sleeper, MD                              Medical Decision Making 62 year old male history as above presented for intermittent nausea and vomiting for 1 month along with shortness of breath.  He has history significant for CHF with EF of 10-15% has been unable to tolerate his home medications.  On exam-he is not in any acute distress but does has significant wheezing on auscultation.  He has received 1 DuoNeb earlier.  He has no significant edema to the legs.  No associated chest pain or abdominal pain.  He is not actively vomiting he received some Zofran prior to my evaluation.  Case discussed with attending physician who plans to evaluate patient.  Amount and/or Complexity of Data Reviewed Labs: ordered.    Details: CBC shows mild anemia of 12.7.  No leukocytosis to suggest acute infectious process.  No thrombocytopenia. Lipase within normal limits, doubt pancreatitis. Magnesium within normal limits.  CMP shows mild elevation of creatinine at 1.40 and BUN of 26 appears baseline.  No emergent electrolyte derangement.  Bilirubin slightly elevated at 1.6.  No emergent LFT elevations.  No gap. COVID/flu and RSV panel negative. BNP significantly elevated at 3500 similar to prior. Radiology: ordered.    Details: I have personally reviewed and interpreted patient's two-view chest x-ray.  Shows cardiomegaly.  I do not appreciate any obvious PTX, PNA or effusion. ECG/medicine tests:     Details: I have personally reviewed and interpreted patient's twelve-lead EKG.  Shows sinus rhythm with first-degree AV block with a ventricular rate of 92.  I do not appreciate any obvious acute ischemic changes.  Risk Prescription drug management. Decision regarding hospitalization. Risk Details: Patient seen and evaluated with attending physician Dr. Renaye Rakers who performed bedside ultrasound which shows B-lines consistent with pulmonary edema.  Wheezing has improved.  Plan to treat for suspected CHF exacerbation with 60 mg IV Lasix.  Patient's  nausea has returned, plan to admit for intractable nausea vomiting and CHF exacerbation.  I consulted with internal medicine team Dr. Burnice Logan at 8:05 AM who accepted patient for admission, they asked that I consult cardiology given patient's history.  I then  spoke with cardiology Dr. Shari Prows at 8:44 AM who advises that they will come and see patient.  Critical Care Total time providing critical care: 31 minutes    Note: Portions of this report may have been transcribed using voice recognition software. Every effort was made to ensure accuracy; however, inadvertent computerized transcription errors may still be present.     05/07/2021: Addendum, critical care time added for CHF exacerbation requiring IV diuretics in hospital admission   Final Clinical Impression(s) / ED Diagnoses Final diagnoses:  Acute on chronic congestive heart failure, unspecified heart failure type  Nausea and vomiting, unspecified vomiting type    Rx / DC Orders ED Discharge Orders     None         Elizabeth Palau 02/16/23 1610    Terald Sleeper, MD 02/16/23 1810    Bill Salinas, PA-C 05/08/23 9604    Terald Sleeper, MD 05/08/23 1433

## 2023-02-16 NOTE — Hospital Course (Signed)
61 yom Couple weeks nausea and vomitng, not able ot keep anything down Trouble breating past week Chf, last ef 10-15%, CHF exacerbation, lasix Admit as well for intracable N/V, got zofran. Prbaby cannabis hyperemesis Admit for diuresis ED to page cardiology.  --------------------------- Dale [name] is a 62 y/o with a PMHx of CHF with EF of 10-15%, CKD, HTN and polysubstance use who presented with 1 month history of daily nausea and nonbloody nonbillious vomiting and admitted for intractable vomiting.   #Intractable Vomiting Presented w/ prolonged nausea/vomiting. Symptoms did improve w/ reflux medications at home. Possibly in the setting of GERD. May also be in the setting of marijuana use. Having regular bowel movements. No concern for SBO at this time. Unlikely viral gastroenteritis due to prolonged duration of symptoms. No elevated liver enzymes/alk phos to suggest liver/gallbladder pathology. Lipase WNL. Electrolytes WNL today. Still having some nausea/vomiting, although improved. Will increase protonix today.  - Protonix 40 mg daily   #HFrEF Echo from 02/2022 demonstrates EF 10-15%. Taking digoxin 0.125 mg BID, farxiga 10 mg daily, and torsemide 60 mg BID at home. Has chronic exertional dyspnea. BNP elevated at 3500 with minimal LE swelling on exam. CXR w/o acute changes. Low concern for acute CHF exacerbation at this time. Cardiology was consulted by ED PA. Received IV lasix 60 mg yesterday. Potassium WNL. Cr worsened to 1.8 today following IV diuresis. Will restart home torsemide and trend renal function.  - Appreciate cardiology recs - Digoxin 125 mcg daily - Farxiga 10 mg daily - home torsemide 60mg  BID - daily weights - strict I+O   #CKD Baseline Cr ~ 1.4-1.5. Cr worsened today w/ IV diuresis.  - trend bmp   #HTN BP WNL today. -Trend BP   # polysubstance use TOC consult for resources Consider naltrexone  -------------------  4/16: Nauseated today; no emesis or  abdominal pain. BM yesterday. Discussed dietary changes as patient recovers, close follow up in clinic.  Appointment with Cards on 4/25 at 10A with APP

## 2023-02-16 NOTE — ED Notes (Signed)
Pt requested solid food to eat. Provider said that was fine. Sandwich, applesauce and crackers proved with ginger ale. Pt appreciative. No other needs requested at this time.

## 2023-02-16 NOTE — ED Triage Notes (Signed)
Patient C/O nonproductive cough, SHOB R/T coughing, generalized body aches N/V X 3 days, and dizziness, unable to hold down oral fluids or food

## 2023-02-16 NOTE — ED Provider Triage Note (Signed)
Emergency Medicine Provider Triage Evaluation Note  Adien Bacho , a 62 y.o. male  was evaluated in triage.  Pt complains of shortness of breath and some coughing for the past 2 weeks denies any chest pain hemoptysis fevers or syncope.  He states that over the past 3 days he has had nausea vomiting and some "runs "denies any blood in his stool no fevers and denies any abdominal pain or chest pain.  Review of Systems  Positive: Short of breath, nausea vomiting diarrhea Negative: Fever  Physical Exam  BP (!) 121/99 (BP Location: Right Arm)   Pulse 89   Temp 98.9 F (37.2 C) (Oral)   Resp 20   Ht 5\' 11"  (1.803 m)   Wt 106.6 kg   SpO2 95%   BMI 32.78 kg/m  Gen:   Awake, no distress   Resp:  Normal effort , WHEEZING MSK:   Moves extremities without difficulty  Other:  Abd soft NTTP  Medical Decision Making  Medically screening exam initiated at 6:29 AM.  Appropriate orders placed.  Deyvion Dundee was informed that the remainder of the evaluation will be completed by another provider, this initial triage assessment does not replace that evaluation, and the importance of remaining in the ED until their evaluation is complete.  Labs, CXR, EKG, tx for wheezing   Gailen Shelter, [place] 02/16/23 0630

## 2023-02-17 DIAGNOSIS — I5043 Acute on chronic combined systolic (congestive) and diastolic (congestive) heart failure: Secondary | ICD-10-CM

## 2023-02-17 DIAGNOSIS — Z8249 Family history of ischemic heart disease and other diseases of the circulatory system: Secondary | ICD-10-CM | POA: Diagnosis not present

## 2023-02-17 DIAGNOSIS — I428 Other cardiomyopathies: Secondary | ICD-10-CM | POA: Diagnosis present

## 2023-02-17 DIAGNOSIS — I255 Ischemic cardiomyopathy: Secondary | ICD-10-CM | POA: Diagnosis present

## 2023-02-17 DIAGNOSIS — R1110 Vomiting, unspecified: Secondary | ICD-10-CM | POA: Diagnosis present

## 2023-02-17 DIAGNOSIS — I2510 Atherosclerotic heart disease of native coronary artery without angina pectoris: Secondary | ICD-10-CM | POA: Diagnosis present

## 2023-02-17 DIAGNOSIS — N179 Acute kidney failure, unspecified: Secondary | ICD-10-CM | POA: Diagnosis present

## 2023-02-17 DIAGNOSIS — I130 Hypertensive heart and chronic kidney disease with heart failure and stage 1 through stage 4 chronic kidney disease, or unspecified chronic kidney disease: Secondary | ICD-10-CM | POA: Diagnosis present

## 2023-02-17 DIAGNOSIS — D631 Anemia in chronic kidney disease: Secondary | ICD-10-CM | POA: Diagnosis present

## 2023-02-17 DIAGNOSIS — Z833 Family history of diabetes mellitus: Secondary | ICD-10-CM | POA: Diagnosis not present

## 2023-02-17 DIAGNOSIS — E876 Hypokalemia: Secondary | ICD-10-CM | POA: Diagnosis present

## 2023-02-17 DIAGNOSIS — I5023 Acute on chronic systolic (congestive) heart failure: Secondary | ICD-10-CM | POA: Diagnosis present

## 2023-02-17 DIAGNOSIS — Z1152 Encounter for screening for COVID-19: Secondary | ICD-10-CM | POA: Diagnosis not present

## 2023-02-17 DIAGNOSIS — E785 Hyperlipidemia, unspecified: Secondary | ICD-10-CM | POA: Diagnosis present

## 2023-02-17 DIAGNOSIS — Z91148 Patient's other noncompliance with medication regimen for other reason: Secondary | ICD-10-CM | POA: Diagnosis not present

## 2023-02-17 DIAGNOSIS — Z79899 Other long term (current) drug therapy: Secondary | ICD-10-CM | POA: Diagnosis not present

## 2023-02-17 DIAGNOSIS — R112 Nausea with vomiting, unspecified: Secondary | ICD-10-CM | POA: Diagnosis present

## 2023-02-17 DIAGNOSIS — F1010 Alcohol abuse, uncomplicated: Secondary | ICD-10-CM | POA: Diagnosis present

## 2023-02-17 DIAGNOSIS — I472 Ventricular tachycardia, unspecified: Secondary | ICD-10-CM | POA: Diagnosis present

## 2023-02-17 DIAGNOSIS — Z885 Allergy status to narcotic agent status: Secondary | ICD-10-CM | POA: Diagnosis not present

## 2023-02-17 DIAGNOSIS — F1210 Cannabis abuse, uncomplicated: Secondary | ICD-10-CM | POA: Diagnosis present

## 2023-02-17 DIAGNOSIS — I509 Heart failure, unspecified: Secondary | ICD-10-CM | POA: Diagnosis not present

## 2023-02-17 DIAGNOSIS — Z823 Family history of stroke: Secondary | ICD-10-CM | POA: Diagnosis not present

## 2023-02-17 DIAGNOSIS — I5022 Chronic systolic (congestive) heart failure: Secondary | ICD-10-CM | POA: Diagnosis present

## 2023-02-17 DIAGNOSIS — I441 Atrioventricular block, second degree: Secondary | ICD-10-CM | POA: Diagnosis present

## 2023-02-17 DIAGNOSIS — K219 Gastro-esophageal reflux disease without esophagitis: Secondary | ICD-10-CM | POA: Diagnosis present

## 2023-02-17 DIAGNOSIS — N1831 Chronic kidney disease, stage 3a: Secondary | ICD-10-CM | POA: Diagnosis present

## 2023-02-17 LAB — BASIC METABOLIC PANEL
Anion gap: 11 (ref 5–15)
BUN: 35 mg/dL — ABNORMAL HIGH (ref 6–20)
CO2: 26 mmol/L (ref 22–32)
Calcium: 8.7 mg/dL — ABNORMAL LOW (ref 8.9–10.3)
Chloride: 100 mmol/L (ref 98–111)
Creatinine, Ser: 1.81 mg/dL — ABNORMAL HIGH (ref 0.61–1.24)
GFR, Estimated: 43 mL/min — ABNORMAL LOW (ref >60)
Glucose, Bld: 121 mg/dL — ABNORMAL HIGH (ref 70–99)
Potassium: 4.2 mmol/L (ref 3.5–5.1)
Sodium: 137 mmol/L (ref 135–145)

## 2023-02-17 LAB — MAGNESIUM: Magnesium: 2 mg/dL (ref 1.7–2.4)

## 2023-02-17 MED ORDER — LORAZEPAM 0.5 MG PO TABS
0.5000 mg | ORAL_TABLET | Freq: Once | ORAL | Status: AC
  Administered 2023-02-17: 0.5 mg via ORAL
  Filled 2023-02-17: qty 1

## 2023-02-17 MED ORDER — PANTOPRAZOLE SODIUM 40 MG PO TBEC
40.0000 mg | DELAYED_RELEASE_TABLET | Freq: Every day | ORAL | Status: DC
  Administered 2023-02-18: 40 mg via ORAL
  Filled 2023-02-17: qty 1

## 2023-02-17 MED ORDER — PANTOPRAZOLE SODIUM 20 MG PO TBEC
20.0000 mg | DELAYED_RELEASE_TABLET | Freq: Every day | ORAL | Status: AC
  Administered 2023-02-17: 20 mg via ORAL
  Filled 2023-02-17: qty 1

## 2023-02-17 MED ORDER — TORSEMIDE 20 MG PO TABS
60.0000 mg | ORAL_TABLET | Freq: Two times a day (BID) | ORAL | Status: DC
  Administered 2023-02-17 – 2023-02-18 (×3): 60 mg via ORAL
  Filled 2023-02-17 (×3): qty 3

## 2023-02-17 NOTE — Progress Notes (Addendum)
Patient states that he has had emesis x 2 since lunch.  Requesting something for nausea.  IMTS paged. Order for ativan placed.

## 2023-02-17 NOTE — Evaluation (Signed)
Occupational Therapy Evaluation Patient Details Name: Dale [name] MRN: [id number] DOB: [birth year] Today's Date: 02/17/2023   History of Present Illness Pt is a 62 y.o. male admitted 4/14 with N&V and DOE. PMH:  CHF with EF of 10-15%, CKD, HTN and polysubstance   Clinical Impression   Pt reports independence at baseline with ADLs and functional mobility, although reports increased difficulty with ADLs due to SOB. Pt currently needing supervision- min guard for ADLs, and mod I for transfers without AD. Pt reports "weakness and giving out" in BLE however no buckling or LOB noted with short distance ambulation. Pt presenting with impairments listed below, will follow acutely. Anticipate no OT follow up needs at d/c pending progression.      Recommendations for follow up therapy are one component of a multi-disciplinary discharge planning process, led by the attending physician.  Recommendations may be updated based on patient status, additional functional criteria and insurance authorization.   Assistance Recommended at Discharge PRN  Patient can return home with the following A little help with walking and/or transfers;A little help with bathing/dressing/bathroom;Assistance with cooking/housework    Functional Status Assessment  Patient has had a recent decline in their functional status and demonstrates the ability to make significant improvements in function in a reasonable and predictable amount of time.  Equipment Recommendations  Tub/shower seat    Recommendations for Other Services PT consult     Precautions / Restrictions Precautions Precautions: None Restrictions Weight Bearing Restrictions: No      Mobility Bed Mobility               General bed mobility comments: seated EOB upon arrival and departure    Transfers Overall transfer level: Modified independent                        Balance Overall balance assessment: No apparent balance deficits  (not formally assessed)                                         ADL either performed or assessed with clinical judgement   ADL Overall ADL's : Needs assistance/impaired Eating/Feeding: Set up;Sitting   Grooming: Set up;Sitting   Upper Body Bathing: Supervision/ safety   Lower Body Bathing: Min guard   Upper Body Dressing : Supervision/safety   Lower Body Dressing: Min guard   Toilet Transfer: Min guard   Toileting- Clothing Manipulation and Hygiene: Min guard       Functional mobility during ADLs: Min guard       Vision   Vision Assessment?: No apparent visual deficits     Development worker, international aid Tested?: No   Praxis Praxis Praxis tested?: Not tested    Pertinent Vitals/Pain Pain Assessment Pain Assessment: No/denies pain     Hand Dominance Right   Extremity/Trunk Assessment Upper Extremity Assessment Upper Extremity Assessment: Overall WFL for tasks assessed   Lower Extremity Assessment Lower Extremity Assessment: Defer to PT evaluation   Cervical / Trunk Assessment Cervical / Trunk Assessment: Normal   Communication Communication Communication: No difficulties   Cognition Arousal/Alertness: Awake/alert Behavior During Therapy: WFL for tasks assessed/performed Overall Cognitive Status: Within Functional Limits for tasks assessed  General Comments  VSS on RA    Exercises     Shoulder Instructions      Home Living Family/patient expects to be discharged to:: Unsure Living Arrangements: Non-relatives/Friends                               Additional Comments: Pt has been staying in a friend's apartment for the past year. He reports he does not want to return to this living arrangement. town house, bathroom upstairs, walk in shower      Prior Functioning/Environment Prior Level of Function : Independent/Modified Independent              Mobility Comments: no AD, pt used bus for transportation ADLs Comments: easier some days, harder other days, reports SOB makes things difficulty        OT Problem List: Decreased activity tolerance;Impaired balance (sitting and/or standing)      OT Treatment/Interventions: Self-care/ADL training;Therapeutic exercise;Therapeutic activities;Patient/family education;Balance training;DME and/or AE instruction;Energy conservation    OT Goals(Current goals can be found in the care plan section) Acute Rehab OT Goals Patient Stated Goal: none stated OT Goal Formulation: With patient Time For Goal Achievement: 03/03/23 Potential to Achieve Goals: Good ADL Goals Pt Will Perform Lower Body Dressing: with supervision;sitting/lateral leans;sit to/from stand Pt Will Perform Tub/Shower Transfer: with supervision;ambulating;Tub transfer;Shower transfer Additional ADL Goal #1: pt will verbalize 3 energy conservation strategies in prep for ADLs  OT Frequency: Min 1X/week    Co-evaluation              AM-PAC OT "6 Clicks" Daily Activity     Outcome Measure Help from another person eating meals?: None Help from another person taking care of personal grooming?: None Help from another person toileting, which includes using toliet, bedpan, or urinal?: A Little Help from another person bathing (including washing, rinsing, drying)?: A Little Help from another person to put on and taking off regular upper body clothing?: None Help from another person to put on and taking off regular lower body clothing?: A Little 6 Click Score: 21   End of Session Nurse Communication: Mobility status (confirmed pt can have icee due to fluid restriction)  Activity Tolerance: Patient tolerated treatment well Patient left: in bed;with call bell/phone within reach;with bed alarm set  OT Visit Diagnosis: Unsteadiness on feet (R26.81);Other abnormalities of gait and mobility (R26.89);Muscle weakness (generalized)  (M62.81)                Time: 1173-5670 OT Time Calculation (min): 16 min Charges:  OT General Charges $OT Visit: 1 Visit OT Evaluation $OT Eval Low Complexity: 1 Low  Carver Fila, OTD, OTR/L SecureChat Preferred Acute Rehab [phone]   Carver Fila Koonce 02/17/2023, 12:52 PM

## 2023-02-17 NOTE — Plan of Care (Signed)

## 2023-02-17 NOTE — Evaluation (Signed)
Physical Therapy Evaluation Patient Details Name: Dale [name] MRN: [id number] DOB: Feb 06, 1961 Today's Date: 02/17/2023  History of Present Illness  Pt is a 62 y.o. male admitted 4/14 with N&V and DOE. PMH:  CHF with EF of 10-15%, CKD, HTN and polysubstance  Clinical Impression  PT eval complete. Pt is independent bed mobility, transfers, and in room ambulation without AD. Supervision provided for hallway ambulation 150' without AD. VSS on RA. Pt reporting fatigue and mild SOB with mobility. No DOE or LOB noted. No further skilled PT intervention indicated. Will have pt work with mobility team to maintain strength/mobility during hospitalization. PT signing off.        Recommendations for follow up therapy are one component of a multi-disciplinary discharge planning process, led by the attending physician.  Recommendations may be updated based on patient status, additional functional criteria and insurance authorization.  Follow Up Recommendations       Assistance Recommended at Discharge PRN  Patient can return home with the following       Equipment Recommendations None recommended by PT  Recommendations for Other Services       Functional Status Assessment Patient has not had a recent decline in their functional status     Precautions / Restrictions Precautions Precautions: None      Mobility  Bed Mobility Overal bed mobility: Independent                  Transfers Overall transfer level: Independent                      Ambulation/Gait Ambulation/Gait assistance: Supervision Gait Distance (Feet): 150 Feet Assistive device: None Gait Pattern/deviations: Step-through pattern, Decreased stride length Gait velocity: decreased Gait velocity interpretation: 1.31 - 2.62 ft/sec, indicative of limited community ambulator   General Gait Details: Steady gait. Pt reports quad fatigue with increased distances. No buckling or LOB noted.  Stairs             Wheelchair Mobility    Modified Rankin (Stroke Patients Only)       Balance Overall balance assessment: No apparent balance deficits (not formally assessed)                                           Pertinent Vitals/Pain Pain Assessment Pain Assessment: No/denies pain    Home Living Family/patient expects to be discharged to:: Unsure Living Arrangements: Non-relatives/Friends                 Additional Comments: Pt has been staying in a friend's apartment for the past year. He reports he does not want to return to this living arrangement.    Prior Function Prior Level of Function : Independent/Modified Independent             Mobility Comments: no AD, pt used bus for transportation       Hand Dominance        Extremity/Trunk Assessment   Upper Extremity Assessment Upper Extremity Assessment: Overall WFL for tasks assessed    Lower Extremity Assessment Lower Extremity Assessment: Overall WFL for tasks assessed    Cervical / Trunk Assessment Cervical / Trunk Assessment: Normal  Communication   Communication: No difficulties  Cognition Arousal/Alertness: Awake/alert Behavior During Therapy: WFL for tasks assessed/performed Overall Cognitive Status: Within Functional Limits for tasks assessed  General Comments General comments (skin integrity, edema, etc.): During mobility, HR 98 and SpO2 97% on RA. Pt reporting mild SOB.    Exercises     Assessment/Plan    PT Assessment Patient does not need any further PT services  PT Problem List         PT Treatment Interventions      PT Goals (Current goals can be found in the Care Plan section)  Acute Rehab PT Goals Patient Stated Goal: feel better PT Goal Formulation: All assessment and education complete, DC therapy    Frequency       Co-evaluation               AM-PAC PT "6 Clicks" Mobility   Outcome Measure Help needed turning from your back to your side while in a flat bed without using bedrails?: None Help needed moving from lying on your back to sitting on the side of a flat bed without using bedrails?: None Help needed moving to and from a bed to a chair (including a wheelchair)?: None Help needed standing up from a chair using your arms (e.g., wheelchair or bedside chair)?: None Help needed to walk in hospital room?: None Help needed climbing 3-5 steps with a railing? : A Little 6 Click Score: 23    End of Session Equipment Utilized During Treatment: Gait belt Activity Tolerance: Patient tolerated treatment well Patient left: in bed;with call bell/phone within reach Nurse Communication: Mobility status PT Visit Diagnosis: Difficulty in walking, not elsewhere classified (R26.2)    Time: 5916-3846 PT Time Calculation (min) (ACUTE ONLY): 14 min   Charges:   PT Evaluation $PT Eval Low Complexity: 1 Low          Ferd Glassing., PT  Office # [phone]   Ilda Foil 02/17/2023, 9:18 AM

## 2023-02-17 NOTE — Progress Notes (Addendum)
Subjective:   Summary: Dale [name] is a 62 y.o. year old male currently admitted on the IMTS HD#0 for intractable vomiting.  Overnight Events: NAEON  Had one episode of nonbloody vomiting overnight, but overall believes that he has improved. He has had SOB while walking around which is chronic. He denies chest pain or abdominal pain. Reports minimal urine output over the last 12 hours.   Objective:  Vital signs in last 24 hours: Vitals:   02/16/23 1919 02/16/23 1951 02/17/23 0335 02/17/23 1210  BP: 115/83 (!) 132/91 112/86 (!) 121/91  Pulse: 92 93 83 89  Resp: 18 18 20 19   Temp:  98.1 F (36.7 C) 98.1 F (36.7 C) 97.9 F (36.6 C)  TempSrc:  Oral Oral Oral  SpO2: 100% 98% 98%   Weight:   108.6 kg   Height:       Supplemental O2: Room Air SpO2: 98 %   Physical Exam:  Constitutional:  in no acute distress Cardiovascular: RRR, no murmurs, rubs or gallops Pulmonary/Chest: normal work of breathing on room air, lungs clear to auscultation bilaterally, end-expiratory wheezes Abdominal: soft, non-tender, non-distended Extremities: warm, well perfused, extremity pulses 2+,  trace bilateral edema from mid-shin down  [facility]   02/16/23 0443 02/17/23 0335  Weight: 106.6 kg 108.6 kg     Intake/Output Summary (Last 24 hours) at 02/17/2023 1322 Last data filed at 02/16/2023 2000 Gross per 24 hour  Intake 240 ml  Output --  Net 240 ml   Net IO Since Admission: 240 mL [02/17/23 1322]  Pertinent Labs:    Latest Ref Rng & Units 02/16/2023    6:38 AM 01/22/2023    4:28 AM 03/18/2022   12:14 PM  CBC  WBC 4.0 - 10.5 K/uL 7.7  7.1    Hemoglobin 13.0 - 17.0 g/dL 94.3  20.0  37.9    44.4   Hematocrit 39.0 - 52.0 % 38.7  38.3  38.0    38.0   Platelets 150 - 400 K/uL 323  281         Latest Ref Rng & Units 02/17/2023    3:22 AM 02/16/2023    6:38 AM 01/28/2023    8:38 AM  CMP  Glucose 70 - 99 mg/dL 619  012  99   BUN 6 - 20 mg/dL 35  26  23    Creatinine 0.61 - 1.24 mg/dL 2.24  1.14  6.43   Sodium 135 - 145 mmol/L 137  138  138   Potassium 3.5 - 5.1 mmol/L 4.2  4.1  3.9   Chloride 98 - 111 mmol/L 100  102  97   CO2 22 - 32 mmol/L 26  23  28    Calcium 8.9 - 10.3 mg/dL 8.7  8.5  8.7   Total Protein 6.5 - 8.1 g/dL  7.1    Total Bilirubin 0.3 - 1.2 mg/dL  1.6    Alkaline Phos 38 - 126 U/L  73    AST 15 - 41 U/L  25    ALT 0 - 44 U/L  14      I personally reviewed interval labs and pertinent results include: anemia, mild hyperglycemia, uremia, elevated Creatinine.  Assessment/Plan:   Principal Problem:   Intractable vomiting   Patient Summary: Dale [name] is a 62 y/o with a PMHx of CHF with EF of 10-15%, CKD, HTN and polysubstance use  who presented with 1 month history of daily nausea and nonbloody nonbillious vomiting and admitted for intractable vomiting.  #Intractable Vomiting Presented w/ prolonged nausea/vomiting. Symptoms did improve w/ reflux medications at home. Possibly in the setting of GERD. May also be in the setting of marijuana use. Having regular bowel movements. No concern for SBO at this time. Unlikely viral gastroenteritis due to prolonged duration of symptoms. No elevated liver enzymes/alk phos to suggest liver/gallbladder pathology. Lipase WNL. Electrolytes WNL today. Still having some nausea/vomiting, although improved. Will increase protonix today.  - Protonix 40 mg daily   #HFrEF Echo from 02/2022 demonstrates EF 10-15%. Taking digoxin 0.125 mg BID, farxiga 10 mg daily, and torsemide 60 mg BID at home. Has chronic exertional dyspnea. BNP elevated at 3500 with minimal LE swelling on exam. CXR w/o acute changes. Low concern for acute CHF exacerbation at this time. Cardiology was consulted by ED PA. Received IV lasix 60 mg yesterday. Potassium WNL. Cr worsened to 1.8 today following IV diuresis. Will restart home torsemide and trend renal function.  - Appreciate cardiology recs - Digoxin 125 mcg daily -  Farxiga 10 mg daily - home torsemide 60mg  BID - daily weights - strict I+O   #CKD Baseline Cr ~ 1.4-1.5. Cr worsened today w/ IV diuresis.  - trend bmp   #HTN BP WNL today. -Trend BP  Diet: Normal VTE: Enoxaparin IVF: None Code: Full PT/OT recs: none   J Occupational hygienist for Student Documentation:  I personally was present and performed or re-performed the history, physical exam and medical decision-making activities of this service and have verified that the service and findings are accurately documented in the student's note.  Karoline Caldwell, MD 02/17/2023, 2:43 PM

## 2023-02-17 NOTE — Progress Notes (Signed)
Patient refusing lovenox sq, states he gets a rash when he gets it.  Educated on reason for lovenox order.  Patient continues to refuse.  Teaching service notified.

## 2023-02-17 NOTE — Progress Notes (Signed)
CSW received consult for patient for substance use resources,food resources,transportation resources,and housing resources. CSW spoke with patient at bedside. Patient reports PTA he comes from home with friend. Patient reports his plan at dc is to return back home. Patient reports he will need transportation at dc. CSW offered patient outpatient substance use treatment services resources,food resources,area shelter resources, and [employer], and bus pass provided for transportation assistance. Patient accepted all resources. Patient accepted bus pass. All questions answered. No further questions reported at this time.

## 2023-02-17 NOTE — Plan of Care (Signed)

## 2023-02-17 NOTE — Progress Notes (Signed)
Heart Failure Navigator Progress Note  Assessed for Heart & Vascular TOC clinic readiness.  Patient does not meet criteria due to Advanced Heart Failure patient.   Navigator will sign off at this time.   Sephora Boyar, BSN, RN Heart Failure Nurse Navigator Secure Chat Only   

## 2023-02-17 NOTE — Progress Notes (Addendum)
Rounding Note    Patient Name: Dale [name] Date of Encounter: 02/17/2023  [facility] Cardiologist: [name]   Subjective   Pt sitting on side of bed just finished with PT and did well. He thinks he may still have some swelling, but mostly c/o ankle cramping.  Inpatient Medications    Scheduled Meds:  aspirin EC  81 mg Oral Daily   atorvastatin  40 mg Oral Daily   dapagliflozin propanediol  10 mg Oral Daily   digoxin  125 mcg Oral Daily   enoxaparin (LOVENOX) injection  40 mg Subcutaneous Q24H   furosemide  60 mg Intravenous Daily   pantoprazole  20 mg Oral Daily   potassium chloride SA  40 mEq Oral BID   Continuous Infusions:  PRN Meds: diphenhydrAMINE   Vital Signs    Vitals:   02/16/23 1917 02/16/23 1919 02/16/23 1951 02/17/23 0335  BP:  115/83 (!) 132/91 112/86  Pulse:  92 93 83  Resp:  18 18 20   Temp: (!) 97.2 F (36.2 C)  98.1 F (36.7 C) 98.1 F (36.7 C)  TempSrc: Axillary  Oral Oral  SpO2:  100% 98% 98%  Weight:    108.6 kg  Height:        Intake/Output Summary (Last 24 hours) at 02/17/2023 2671 Last data filed at 02/16/2023 2000 Gross per 24 hour  Intake 240 ml  Output --  Net 240 ml      02/17/2023    3:35 AM 02/16/2023    4:43 AM 01/28/2023    8:24 AM  Last 3 Weights  Weight (lbs) 239 lb 8 oz 235 lb 243 lb 6.4 oz  Weight (kg) 108.636 kg 106.595 kg 110.406 kg      Telemetry    Sinus in the 90s - Personally Reviewed  ECG    No new tracings - Personally Reviewed  Physical Exam   GEN: No acute distress.   Neck: No JVD Cardiac: RRR, no murmurs, rubs, or gallops.  Respiratory: rhonchi bilaterally  GI: Soft, nontender, non-distended  MS: minimal B LE edema; No deformity. Neuro:  Nonfocal  Psych: Normal affect   Labs    High Sensitivity Troponin:   Recent Labs  Lab 01/22/23 0428 01/22/23 0645  TROPONINIHS 45* 42*     Chemistry Recent Labs  Lab 02/16/23 0638 02/17/23 0322  NA 138 137  K 4.1 4.2  CL 102  100  CO2 23 26  GLUCOSE 101* 121*  BUN 26* 35*  CREATININE 1.40* 1.81*  CALCIUM 8.5* 8.7*  MG 2.1 2.0  PROT 7.1  --   ALBUMIN 3.3*  --   AST 25  --   ALT 14  --   ALKPHOS 73  --   BILITOT 1.6*  --   GFRNONAA 58* 43*  ANIONGAP 13 11    Lipids No results for input(s): "CHOL", "TRIG", "HDL", "LABVLDL", "LDLCALC", "CHOLHDL" in the last 168 hours.  Hematology Recent Labs  Lab 02/16/23 0638  WBC 7.7  RBC 4.34  HGB 12.7*  HCT 38.7*  MCV 89.2  MCH 29.3  MCHC 32.8  RDW 15.5  PLT 323   Thyroid No results for input(s): "TSH", "FREET4" in the last 168 hours.  BNP Recent Labs  Lab 02/16/23 0638  BNP 3,511.2*    DDimer No results for input(s): "DDIMER" in the last 168 hours.   Radiology    DG Chest 2 View  Result Date: 02/16/2023 CLINICAL DATA:  Shortness of breath. EXAM: CHEST -  2 VIEW COMPARISON:  01/22/19 for FINDINGS: Cardiac enlargement is again noted. No signs of pleural effusion or interstitial edema. No airspace opacities identified. The visualized osseous structures are unremarkable. IMPRESSION: Cardiac enlargement. No acute findings. Electronically Signed   By: Signa Kell M.D.   On: 02/16/2023 07:11    Cardiac Studies   Echo 02/19/22: 1. Left ventricular ejection fraction, by estimation, is 10-15%. The left  ventricle has severely decreased function. The left ventricle demonstrates  global hypokinesis. The left ventricular internal cavity size was severely  dilated. Indeterminate  diastolic filling due to E-A fusion.   2. Right ventricular systolic function is severely reduced. The right  ventricular size is severely enlarged. There is mildly elevated pulmonary  artery systolic pressure. The estimated right ventricular systolic  pressure is 37.7 mmHg.   3. Left atrial size was severely dilated.   4. Right atrial size was severely dilated.   5. The mitral valve is grossly normal. Mild to moderate mitral valve  regurgitation. No evidence of mitral stenosis.    6. The aortic valve is tricuspid. Aortic valve regurgitation is trivial.  No aortic stenosis is present.   7. The inferior vena cava is dilated in size with <50% respiratory  variability, suggesting right atrial pressure of 15 mmHg.   Patient Profile     62 y.o. male with a hx of chronic HFrEF (mixed ischemic and nonischemic), polysubstance abuse, alcohol abuse, hypertension who is being seen for the evaluation of CHF.  Assessment & Plan    Acute on chronic systolic heart failure Combined ICM/NICM RV failure Acute on chronic renal insufficiency - reduced EF since 2019 - echo 02/19/22 with EF 10-15% with severe LV dilation, severely reduced RV, severe biatrial enlargement, mild to moderate MR - has been followed by AHF team and deemed not a candidate for ICD due to ongoing alcohol use/abuse - PTA: torsemide 60 mg BID, digoxin 0.125 mg, 10 mg farxiga - has received 60 mg IV lasix x 2 doses - does not appear significantly volume up - agree with holding IV diuresis today given physical exam and rising creatinine - continue farxiga and digoxin - sCr 1.81 - baseline appears to be 1.4-1.6    CAD - heart cath 03/2022 with CTO of proximal RCA with collaterals,  - continue ASA and 40 mg lipitor - no chest pain   Noncompliance - hx of admissions/ER visits for CHF exacerbation due to medication nonadherence/running out of medications/vomiting   Nausea and vomiting - for the past month - will defer to primary, ?GI consult    For questions or updates, please contact [facility] Please consult [email or web address] for contact info under        Signed, Marcelino Duster, PA  02/17/2023, 7:22 AM     Attending Note:   The patient was seen and examined.  Agree with assessment and plan as noted above.  Changes made to the above note as needed.  Patient seen and independently examined with Bettina Gavia, PA .   We discussed all aspects of the encounter. I agree with the assessment and  plan as stated above.   CHF :  appears euvolumic today.  His complaints are mostly GI related.   CXR does not show any pulmonary edema.  No significant leg edema .  Creatinine is up to 1.8 ( from 1.4) IV lasix was held   Cont current meds.  2.  Nausea / vomiting :  he may benefit from a GI consult.  I have spent a total of 40 minutes with patient reviewing hospital  notes , telemetry, EKGs, labs and examining patient as well as establishing an assessment and plan that was discussed with the patient.  > 50% of time was spent in direct patient care.    Vesta Mixer, Montez Hageman., MD, [facility] 02/17/2023, 9:25 AM [address],  [address] Office [phone] Pager [phone]

## 2023-02-18 ENCOUNTER — Other Ambulatory Visit: Payer: Self-pay

## 2023-02-18 ENCOUNTER — Other Ambulatory Visit (HOSPITAL_COMMUNITY): Payer: Self-pay

## 2023-02-18 DIAGNOSIS — I5020 Unspecified systolic (congestive) heart failure: Secondary | ICD-10-CM

## 2023-02-18 DIAGNOSIS — I509 Heart failure, unspecified: Secondary | ICD-10-CM

## 2023-02-18 DIAGNOSIS — R112 Nausea with vomiting, unspecified: Secondary | ICD-10-CM | POA: Diagnosis not present

## 2023-02-18 LAB — BASIC METABOLIC PANEL
Anion gap: 11 (ref 5–15)
BUN: 36 mg/dL — ABNORMAL HIGH (ref 6–20)
CO2: 30 mmol/L (ref 22–32)
Calcium: 8 mg/dL — ABNORMAL LOW (ref 8.9–10.3)
Chloride: 100 mmol/L (ref 98–111)
Creatinine, Ser: 1.7 mg/dL — ABNORMAL HIGH (ref 0.61–1.24)
GFR, Estimated: 46 mL/min — ABNORMAL LOW (ref >60)
Glucose, Bld: 78 mg/dL (ref 70–99)
Potassium: 3.3 mmol/L — ABNORMAL LOW (ref 3.5–5.1)
Sodium: 141 mmol/L (ref 135–145)

## 2023-02-18 LAB — MAGNESIUM: Magnesium: 1.9 mg/dL (ref 1.7–2.4)

## 2023-02-18 MED ORDER — TRIMETHOBENZAMIDE HCL 300 MG PO CAPS: 300.0000 mg | ORAL_CAPSULE | Freq: Three times a day (TID) | ORAL | 0 refills | Status: AC | PRN

## 2023-02-18 MED ORDER — PANTOPRAZOLE SODIUM 40 MG PO TBEC
40.0000 mg | DELAYED_RELEASE_TABLET | Freq: Every day | ORAL | 0 refills | Status: DC
  Filled 2023-02-18: qty 30, 30d supply, fill #0

## 2023-02-18 MED ORDER — POTASSIUM CHLORIDE 20 MEQ PO PACK
40.0000 meq | PACK | Freq: Two times a day (BID) | ORAL | Status: DC
  Administered 2023-02-18: 40 meq via ORAL
  Filled 2023-02-18: qty 2

## 2023-02-18 MED ORDER — TRIMETHOBENZAMIDE HCL 300 MG PO CAPS
300.0000 mg | ORAL_CAPSULE | Freq: Three times a day (TID) | ORAL | 0 refills | Status: DC | PRN
  Filled 2023-02-18: qty 90, 30d supply, fill #0

## 2023-02-18 MED ORDER — POTASSIUM CHLORIDE 20 MEQ PO PACK
40.0000 meq | PACK | Freq: Once | ORAL | Status: AC
  Administered 2023-02-18: 40 meq via ORAL
  Filled 2023-02-18: qty 2

## 2023-02-18 MED ORDER — MAGNESIUM SULFATE 2 GM/50ML IV SOLN
2.0000 g | Freq: Once | INTRAVENOUS | Status: AC
  Administered 2023-02-18: 2 g via INTRAVENOUS
  Filled 2023-02-18: qty 50

## 2023-02-18 NOTE — Progress Notes (Addendum)
Rounding Note    Patient Name: Deandrea Rion Date of Encounter: 02/18/2023  [facility] Cardiologist: Marca Ancona, MD   Subjective   Pt seen in bed, continues to have intermittent vomiting  Inpatient Medications    Scheduled Meds:  aspirin EC  81 mg Oral Daily   atorvastatin  40 mg Oral Daily   dapagliflozin propanediol  10 mg Oral Daily   digoxin  125 mcg Oral Daily   enoxaparin (LOVENOX) injection  40 mg Subcutaneous Q24H   pantoprazole  40 mg Oral Daily   potassium chloride  40 mEq Oral BID   torsemide  60 mg Oral BID   Continuous Infusions:  PRN Meds: diphenhydrAMINE   Vital Signs    Vitals:   02/17/23 1824 02/17/23 1948 02/18/23 0319 02/18/23 0321  BP: 107/83 113/82  (!) 123/105  Pulse: 93 96  98  Resp: Temp: (!) 97.2 F (36.2 C) (!) 97.3 F (36.3 C)  97.6 F (36.4 C)  TempSrc: Oral Oral  Oral  SpO2: 92% 100%  96%  Weight:   108.3 kg   Height:        Intake/Output Summary (Last 24 hours) at 02/18/2023 0756 Last data filed at 02/18/2023 0600 Gross per 24 hour  Intake 960 ml  Output 4025 ml  Net -3065 ml      02/18/2023    3:19 AM 02/17/2023    3:35 AM 02/16/2023    4:43 AM  Last 3 Weights  Weight (lbs) 238 lb 11.2 oz 239 lb 8 oz 235 lb  Weight (kg) 108.274 kg 108.636 kg 106.595 kg      Telemetry    Sinus rhythm HR 70s, 22 beat run of VT, NSVT of 3-5 beats, second degree heart block overnight, appears 2:1, first degree heart block, and junctional bradycardia during nocturnal hours - Personally Reviewed  ECG    No new tracings - Personally Reviewed  Physical Exam   GEN: No acute distress.   Neck: No JVD Cardiac: RRR, no murmurs, rubs, or gallops.  Respiratory: Clear to auscultation bilaterally. GI: Soft, nontender, non-distended  MS: No edema; No deformity. Neuro:  Nonfocal  Psych: Normal affect   Labs    High Sensitivity Troponin:   Recent Labs  Lab 01/22/23 0428 01/22/23 0645  TROPONINIHS 45* 42*      Chemistry Recent Labs  Lab 02/16/23 0638 02/17/23 0322 02/18/23 0223  NA 138 137 141  K 4.1 4.2 3.3*  CL 102 100 100  CO2 GLUCOSE 101* 121* 78  BUN 26* 35* 36*  CREATININE 1.40* 1.81* 1.70*  CALCIUM 8.5* 8.7* 8.0*  MG 2.1 2.0 1.9  PROT 7.1  --   --   ALBUMIN 3.3*  --   --   AST 25  --   --   ALT 14  --   --   ALKPHOS 73  --   --   BILITOT 1.6*  --   --   GFRNONAA 58* 43* 46*  ANIONGAP Lipids No results for input(s): "CHOL", "TRIG", "HDL", "LABVLDL", "LDLCALC", "CHOLHDL" in the last 168 hours.  Hematology Recent Labs  Lab 02/16/23 0638  WBC 7.7  RBC 4.34  HGB 12.7*  HCT 38.7*  MCV 89.2  MCH 29.3  MCHC 32.8  RDW 15.5  PLT 323   Thyroid No results for input(s): "TSH", "FREET4" in the last 168 hours.  BNP Recent Labs  Lab 02/16/23 0638  BNP 3,511.2*    DDimer No results for input(s): "DDIMER" in the last 168 hours.   Radiology    No results found.  Cardiac Studies   Echo 02/19/22: 1. Left ventricular ejection fraction, by estimation, is 10-15%. The left  ventricle has severely decreased function. The left ventricle demonstrates  global hypokinesis. The left ventricular internal cavity size was severely  dilated. Indeterminate  diastolic filling due to E-A fusion.   2. Right ventricular systolic function is severely reduced. The right  ventricular size is severely enlarged. There is mildly elevated pulmonary  artery systolic pressure. The estimated right ventricular systolic  pressure is 37.7 mmHg.   3. Left atrial size was severely dilated.   4. Right atrial size was severely dilated.   5. The mitral valve is grossly normal. Mild to moderate mitral valve  regurgitation. No evidence of mitral stenosis.   6. The aortic valve is tricuspid. Aortic valve regurgitation is trivial.  No aortic stenosis is present.   7. The inferior vena cava is dilated in size with <50% respiratory  variability, suggesting right atrial pressure of  15 mmHg.   Patient Profile     62 y.o. male with a hx of chronic HFrEF (mixed ischemic and nonischemic), polysubstance abuse, alcohol abuse, hypertension who is being seen for the evaluation of CHF.   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 Acute on chronic renal insufficiency - known reduced EF since 2019 - echo 02/19/22 with LVEF 10-15%, severe LV dilation, severely reduced RV, severe biatrial enlargement, mild to moderate MR - has been followed by AHF, not a candidate for ICD due to ongoing alcohol use/abuse - PTA: torsemide 60 mg BID, digoxin 0.125 mg, 10 mg farxiga - received 60 mg IV lasix x 2 doses - held yesterday for exam consistent with euvolemia and rising creatinine - sCr improving to 1.7 (1.81) - baseline appears to be 1.4-1.6 - restarting home torsemide - consider BMP 1 week after discharge with Mg level   Second degree heart block - 2:1 ?second degree heart block mobitz 1 Junctional bradycardia 22-beat run of VT - note by RN states pt was asymptomatic/sleeping at the time of reported high grade heart block and states the telemetry leads were not in proper location - K 3.3, receiving supplementation per primary - Mg 1.9 - will give 2 g IV Mg this morning - avoid AV nodal agents - may need to discharge on Mg OTC - may be difficult given noncompliance   CAD - heart cath 03/2022 with CTO of proximal RCA with collaterals - no intervention - continue ASA and 40 mg crestor - no chest pain   Nonadherence - hx of admissions/ER visits for CHF exacerbation due to medication noncompliance/running out of medications/vomiting - he understands the importance of taking medications   Nausea and vomiting - ongoing for longer than 1 months - defer to primary care      For questions or updates, please contact [facility] Please consult [email or web address] for contact info under        Signed, Marcelino Duster, PA   02/18/2023, 7:56 AM     Attending Note:   The patient was seen and examined.  Agree with assessment and plan as noted above.  Changes made to the above note as needed.  Patient seen and independently examined with Bettina Gavia, PA .   We discussed all aspects of the encounter. I agree  with the assessment and plan as stated above.   Chronic combined CHF: has severely depressed LV function and RV failure  Will follow up in the Advanced Heart Failure clinic   2.  NS VT :  cont current meds for his CHF    3/ CAD :  no angina    I have spent a total of 40 minutes with patient reviewing hospital  notes , telemetry, EKGs, labs and examining patient as well as establishing an assessment and plan that was discussed with the patient.  > 50% of time was spent in direct patient care.  OK for DC    Vesta Mixer, Montez Hageman., MD, [facility] 02/18/2023, 1:18 PM [address],  [address] Office [phone] Pager [phone]

## 2023-02-18 NOTE — Discharge Summary (Addendum)
Name: Dale [name] MRN: [id number] DOB: 05-21-1961 62 y.o. PCP: Pcp, No  Date of Admission: 02/16/2023  4:24 AM Date of Discharge: 02/18/2023 Attending Physician: Dickie La, MD  Discharge Diagnosis: 1. Principal Problem:   Intractable vomiting Active Problems:   Intractable nausea and vomiting   HFrEF   CKD   HTN   Hypokalemia   Polysubstance use  Discharge Medications: Allergies as of 02/18/2023       Reactions   Hydrocodone Itching        Medication List     TAKE these medications    aspirin EC 81 MG tablet Take 1 tablet (81 mg total) by mouth daily. Swallow whole.   atorvastatin 40 MG tablet Commonly known as: LIPITOR TAKE ONE TABLET BY MOUTH ONCE DAILY FOR CHOLESTEROL What changed: See the new instructions.   digoxin 0.125 MG tablet Commonly known as: LANOXIN TAKE ONE TABLET BY MOUTH ONCE DAILY   Farxiga 10 MG Tabs tablet Generic drug: dapagliflozin propanediol TAKE ONE TABLET BY MOUTH ONCE DAILY   pantoprazole 40 MG tablet Commonly known as: PROTONIX Take 1 tablet (40 mg total) by mouth daily. Start taking on: February 19, 2023   potassium chloride SA 20 MEQ tablet Commonly known as: KLOR-CON M Take 2 tablets (40 mEq total) by mouth 2 (two) times daily.   torsemide 20 MG tablet Commonly known as: DEMADEX Take 3 tablets (60 mg total) by mouth 2 (two) times daily.   trimethobenzamide 300 MG capsule Commonly known as: Tigan Take 1 capsule (300 mg total) by mouth 3 (three) times daily as needed for nausea/vomiting.        Disposition and follow-up:   Dale [name] was discharged from [facility] in Good condition.  At the hospital follow up visit please address:  1.    A. Nausea/Vomiting  - Suspect 2/2 GERD, discharged on protonix, may need outpatient GI f/u if continuing to have nausea/vomiting and/or further weight loss.   B. Chronic HFrEF  - Discharged on home torsemide 60 mg BID, farxiga 10 mg daily,  digoxin 125 mcg daily, to f/u w/ cardiology as outpatient  2.  Labs / imaging needed at time of follow-up: BMP  3.  Pending labs/ test needing follow-up: none  Follow-up Appointments: - Please follow up with your primary care provider at the [facility] on 02/26/2023 at 10:45 AM. Phone: [phone] Address: Ground Floor - [facility], [address] [place], [place], [place] [address]   - Please follow up with cardiology at the [facility] on 02/27/2023 at 10:00 AM  Phone: [phone] Address: [address] [place], [place], [place] [address]  Hospital Course by problem list:  Dale [name] is a 62 y/o with a PMHx of CHF with EF of 10-15%, CKD3a, HTN and polysubstance use who presented with 1 month history of daily nausea and nonbloody, nonbilious vomiting and admitted for vomiting.   #Intermittent nausea/vomiting Presented w/ prolonged nausea/vomiting. Reported minimal marijuana use at home, had low suspicion for cannabinoid hyperemesis syndrome. A1c was normal, therefore had low suspicion for gastroparesis. Was passing flatus and having bowel movements, therefore low suspicion for obstruction. Given prolonged symptoms, viral gastroenteritis was unlikely. Normal lipase excluded pancreatitis. Liver enzymes/alk phos were WNL, therefore unlikely liver/gallbladder etiology. Reported symptom improvement w/ reflux medications given to him by a friend at home. Started the patient on protonix with mild improvement of his symptoms. Suspect symptoms  may be secondary to GERD. Remained HDS. Discharged on protonix. Patient to follow up with [facility] clinic to discuss symptoms as outpatient where he may need further titration of medication. May need referral to GI if symptoms persist as outpatient. Did not have significant electrolyte abnormalities on admission. Would recommend repeat BMP after discharge to ensure no further electrolyte  derangements if vomiting continues. Qtc was prolonged, therefore discharged with oral Tigan. Patient not able to afford this with GoodRx coupon ($9) so PA was started.    #HFrEF Echo from 02/2022 demonstrates EF 10-15%. Taking digoxin 0.125 mg BID, farxiga 10 mg daily, and torsemide 60 mg BID at home. Has chronic exertional dyspnea. BNP elevated at 3500 on admission. CXR w/o acute changes. No increased oxygen requirement. Had mild LE swelling bilaterally on exam but otherwise appeared euvolemic. Had low suspicion for acute CHF exacerbation. Cardiology was consulted in the ED. Initially tried IV lasix, however, renal function worsened. Cr improved after restarting home torsemide. Discharged on home torsemide 60 mg BID. Continued home digoxin and farxiga throughout his hospitalization and at discharge. Will f/u with cardiology as outpatient.    #AKI on CKD3a Baseline Cr ~ 1.4-1.5. Cr initially worsened w/ IV diuresis. Recent history of vomiting may also have been contributing to rise inc Cr. Cr improved to 1.70 on day of discharge after restarting home torsemide. Will need repeat BMP as outpatient to ensure Cr continues to trend downward.    #HTN Continued home torsemide and farxiga at discharge.   #Hypokalemia K 3.3 on the morning of discharge in the setting of not receiving regular bid potassium. Administered PO potassium 40 mEq BID on day of discharge. Sent home with home potassium regimen. Will need repeat BMP as outpatient.  Discharge Exam:   BP 120/81 (BP Location: Left Arm)   Pulse 89   Temp 97.8 F (36.6 C) (Oral)   Resp 19   Ht  (1.803 m)   Wt 108.3 kg   SpO2 96%   BMI 33.29 kg/m  Constitutional: not in acute distress Cardiovascular: RRR, no murmurs, rubs or gallops Pulmonary/Chest: normal work of breathing on room air, no wheezes or crackles Abdominal: soft, non-tender, non-distended Extremities: warm, well perfused, extremity pulses 2+,  trace bilateral lower extremity  edema from mid-shin down  Pertinent Labs, Studies, and Procedures:     Latest Ref Rng & Units 02/16/2023    6:38 AM 01/22/2023    4:28 AM 03/18/2022   12:14 PM  CBC  WBC 4.0 - 10.5 K/uL 7.7  7.1    Hemoglobin 13.0 - 17.0 g/dL 16.1  09.6  04.5    40.9   Hematocrit 39.0 - 52.0 % 38.7  38.3  38.0    38.0   Platelets 150 - 400 K/uL 323  281         Latest Ref Rng & Units 02/18/2023    2:23 AM 02/17/2023    3:22 AM 02/16/2023    6:38 AM  BMP  Glucose 70 - 99 mg/dL 78  811  914   BUN 6 - 20 mg/dL 36  35  26   Creatinine 0.61 - 1.24 mg/dL 7.82  9.56  2.13   Sodium 135 - 145 mmol/L 141  137  138   Potassium 3.5 - 5.1 mmol/L 3.3  4.2  4.1   Chloride 98 - 111 mmol/L 100  100  102   CO2 22 - 32 mmol/L Calcium 8.9 - 10.3  mg/dL 8.0  8.7  8.5     DG Chest 2 View  IMPRESSION: Cardiac enlargement. No acute findings.   On: 02/16/2023 07:11   Discharge Instructions: Discharge Instructions     Call MD for:  extreme fatigue   Complete by: As directed    Call MD for:  persistant dizziness or light-headedness   Complete by: As directed    Call MD for:  persistant nausea and vomiting   Complete by: As directed    Call MD for:  severe uncontrolled pain   Complete by: As directed    Diet - low sodium heart healthy   Complete by: As directed    Increase activity slowly   Complete by: As directed       You were hospitalized for persistent nausea and vomiting.    Hospital Course: Based on our assessment, we believe that your nausea and vomiting may be due to acid reflux (GERD). We started you on a medication to help with acid reflux (pantoprazole) to improve your vomiting. We have scheduled you with a follow up appointment in our clinic to discuss your hospitalization, this new medication, and to track your symptoms outside of the hospital. If you continue to have nausea and vomiting outside of the hospital, we can refer you to a stomach doctor (gastroenterologist) in our clinic.     Medications:   Please start taking: -Pantoprazole 40 mg daily   Please continue taking: -Aspirin 81 mg daily -Atorvastatin 40 mg daily -Digoxin 0.125 mg daily -Farxiga 10 mg daily -Torsemide 60 mg twice daily -Potassium chloride 40 mEq bid   Follow-up: - Please follow up with your primary care provider at the [facility] on 02/26/2023 at 10:45 AM. Phone: [phone] Address: Ground Floor - [facility], [address] [place], [place], [place] [address]   - Please follow up with cardiology at the [facility] on 02/27/2023 at 10:00 AM  Phone: [phone] Address: [address] [place], [place], [place] [address]  Signed: Karoline Caldwell, MD 02/18/2023, 2:35 PM   Pager: [phone]

## 2023-02-18 NOTE — Progress Notes (Signed)
Mobility Specialist Progress Note:   02/18/23 1200  Mobility  Activity Ambulated with assistance in hallway  Level of Assistance Standby assist, set-up cues, supervision of patient - no hands on  Assistive Device None  Distance Ambulated (ft) 150 ft  Activity Response Tolerated well  Mobility Referral Yes  $Mobility charge 1 Mobility   Pt agreeable to mobility session. Required no physical assistance, only supervision for safety. Pt c/o generalized weakness d/t frequent N/V. Otherwise asx. Left pt sitting EOB with all needs met.   Addison Lank Mobility Specialist Please contact via SecureChat or  Rehab office at [phone]

## 2023-02-18 NOTE — Plan of Care (Signed)

## 2023-02-18 NOTE — Discharge Instructions (Addendum)
You were hospitalized for persistent nausea and vomiting.   Hospital Course: Based on our assessment, we believe that your nausea and vomiting may be due to acid reflux (GERD). We started you on a medication to help with acid reflux (pantoprazole) to improve your vomiting. We have scheduled you with a follow up appointment in our clinic to discuss your hospitalization, this new medication, and to track your symptoms outside of the hospital. If you continue to have nausea and vomiting outside of the hospital, we can refer you to a stomach doctor (gastroenterologist) in our clinic.   Medications:  Please start taking: -Pantoprazole 40 mg daily  Please continue taking: -Aspirin 81 mg daily -Atorvastatin 40 mg daily -Digoxin 0.125 mg daily -Farxiga 10 mg daily -Torsemide 60 mg twice daily -Potassium chloride 20 mEq tablet   Follow-up: - Please follow up with your primary care provider at the [facility] on 02/26/2023 at 10:45 AM. Phone: [phone] Address: Ground Floor - [facility], [address] [place], [place], [place] [address]  - Please follow up with cardiology at the [facility] on 02/27/2023 at 10:00 AM  Phone: [phone] Address: [address] [place], [place], [place] [address]

## 2023-02-18 NOTE — Progress Notes (Signed)
Received call from CCMD regarding 2 degree AV block type 2? 3rd degree AVB?Marland Kitchen Patient is asleep. Leads were not in proper location. Patient denied chest pain. Asymptomatic. HR 80 bpm, RR 20, BP 110/81, Sp02 = 98% on room air. Plan of care ongoing.

## 2023-02-19 NOTE — Telephone Encounter (Signed)
Created in error

## 2023-02-20 ENCOUNTER — Telehealth: Payer: Self-pay | Admitting: *Deleted

## 2023-02-20 NOTE — Telephone Encounter (Signed)
Brandon with [facility] called in to request new Rx for a different anti-nausea med as he does not have Tigan in stock and cannot order it.

## 2023-02-21 NOTE — Telephone Encounter (Signed)
Call to patient to see where he would like to have prescription sent.  Unable to reach patient or leave a message.  Second call to Home number message was left to call Clinics concerning a Pharmacy prescription.

## 2023-02-26 ENCOUNTER — Telehealth (HOSPITAL_COMMUNITY): Payer: Self-pay

## 2023-02-26 NOTE — Progress Notes (Signed)
PCP: No Primary Cardiologist: Dr [name]   HPI: Dale [name] is a 62 year old with a history of HTN, polysubstance abuse, ETOH abuse, mixed NICM/ICM, and chronic HFrEF. Severely reduced EF dating back to 2019.   Has had multiple admit for HF exacerbation frequently associated with medication noncompliance.     Echo  02/2022 - EF 10-15%, global HK, severe LV dilation, severely enlarged RV with severely decreased systolic function, severe biatrial enlargement, moderate Dale, dilated IVC     R/LHC (5/23): occluded pRCA w/ collaterals, nonobstructive disease in the left system, markedly elevated filling pressures,  pulmonary venous hypertension, low CO.RA mean 23, RV 50/21, PA 53/32, mean 40, PCWP mean 39, LV 117/35, AO 117/82, CO/CI (Fick): 3.5/1.53, PVR < 1 WU, Oxygen saturations: PA 47%, AO 97%.   Previously followed by HF Paramedicine. Discharged 12/13/22 due to difficulty contacting him, ETOH abuse, and ongoing noncompliance.   Presented to [facility] 01/22/23 with increased dyspnea. Out of HF meds for 3-4 days. Diuresed with IV lasix and sent home later that day. [facility] verified he had medications ready for pick up or delivery.   He was readmitted earlier this month with acute on chronic CHF. Still noncompliant with medications. Home HF medications were restarted. Intractable nausea and vomiting also noted and felt to be secondary to GERD. Improvement noted with protonix.  He is here today for hospital follow-up.   Drinking 1 bottle of alcohol per day. Smokes marijuana daily. Taking all medications. He has all medications except he ran out of aspirin. Lives with a friend. Trying to get housing. Uses bus passes.   ROS: All systems negative except as listed in HPI, PMH and Problem List.  SH:  Social History   Socioeconomic History   Marital status: Single    Spouse name: none   Number of children: Not on file   Years of education: Not on file   Highest education level: Not on file   Occupational History   Occupation: Disabled  Tobacco Use   Smoking status: Never   Smokeless tobacco: Never  Vaping Use   Vaping Use: Never used  Substance and Sexual Activity   Alcohol use: Yes    Comment: 1 quarts a week   Drug use: Not Currently    Frequency: 3.0 times per week    Types: Marijuana, Cocaine   Sexual activity: Not Currently  Other Topics Concern   Not on file  Social History Narrative   Lives w/ mother in [place] but recently visiting w/ dtr in [place].   Social Determinants of Health   Financial Resource Strain: High Risk (02/20/2022)   Overall Financial Resource Strain (CARDIA)    Difficulty of Paying Living Expenses: Hard  Food Insecurity: Food Insecurity Present (02/16/2023)   Hunger Vital Sign    Worried About Running Out of Food in the Last Year: Sometimes true    Ran Out of Food in the Last Year: Sometimes true  Transportation Needs: Unmet Transportation Needs (02/16/2023)   PRAPARE - Administrator, Civil Service (Medical): Yes    Lack of Transportation (Non-Medical): Yes  Physical Activity: Not on file  Stress: Not on file  Social Connections: Not on file  Intimate Partner Violence: Not At Risk (02/16/2023)   Humiliation, Afraid, Rape, and Kick questionnaire    Fear of Current or Ex-Partner: No    Emotionally Abused: No    Physically Abused: No    Sexually Abused: No    FH:  Family History  Problem Relation Age of Onset   Hypertension Maternal Grandmother     Past Medical History:  Diagnosis Date   Alcohol abuse    CAD (coronary artery disease)    a. 05/2019 Cath: LM nl, LAD 15p, 66m, LCX large/nl, OM2 30, OM3 20, RCA 100 CTO w/ bridging L->R collats to RPDA.   Chronic combined systolic and diastolic CHF (congestive heart failure)    a. 09/27/18 Echo: EF 20-25%, grade 2 DD; b. 03/2019 Echo: EF 20-25%; c. 05/2021 Echo: EF <10%, glob HK. GrII DD. Sev red RV fxn. Sev BAE. Mild Dale. Mild-mod TR.   CKD (chronic kidney disease), stage II     Cocaine abuse    Hyperlipidemia LDL goal <70    Hypertension    Ischemic cardiomyopathy    a. 09/27/18 Echo: EF 20-25%, grade 2 DD; b. 03/2019 Echo: EF 20-25%; c. 05/2019 Cath: RCA 100 CTA, otw nonobs dzs; d. 05/2021 Echo: EF <10%, glob HK. GrII DD. Sev red RV fxn.   Morbid obesity    Noncompliance    Normocytic anemia     Current Outpatient Medications  Medication Sig Dispense Refill   aspirin EC 81 MG tablet Take 1 tablet (81 mg total) by mouth daily. Swallow whole. 30 tablet 11   atorvastatin (LIPITOR) 40 MG tablet TAKE ONE TABLET BY MOUTH ONCE DAILY FOR CHOLESTEROL (Patient taking differently: Take 40 mg by mouth daily.) 90 tablet 3   digoxin (LANOXIN) 0.125 MG tablet TAKE ONE TABLET BY MOUTH ONCE DAILY 90 tablet 3   FARXIGA 10 MG TABS tablet TAKE ONE TABLET BY MOUTH ONCE DAILY 90 tablet 3   pantoprazole (PROTONIX) 40 MG tablet Take 1 tablet (40 mg total) by mouth daily. 30 tablet 0   potassium chloride SA (KLOR-CON M) 20 MEQ tablet Take 2 tablets (40 mEq total) by mouth 2 (two) times daily. 120 tablet 8   torsemide (DEMADEX) 20 MG tablet Take 3 tablets (60 mg total) by mouth 2 (two) times daily. 180 tablet 8   trimethobenzamide (TIGAN) 300 MG capsule Take 1 capsule (300 mg total) by mouth 3 (three) times daily as needed for nausea/vomiting. 90 capsule 0   No current facility-administered medications for this visit.    There were no vitals filed for this visit.  Wt Readings from Last 3 Encounters:  02/18/23 108.3 kg (238 lb 11.2 oz)  01/28/23 110.4 kg (243 lb 6.4 oz)  01/22/23 120.2 kg (265 lb)    PHYSICAL EXAM: General: Walked in the clinic. No resp difficulty HEENT: normal Neck: supple. JVP flat. Carotids 2+ bilaterally; no bruits. No lymphadenopathy or thryomegaly appreciated. Cor: PMI normal. Regular rate & rhythm. No rubs, gallops or murmurs. Lungs: clear Abdomen: soft, nontender, nondistended. No hepatosplenomegaly. No bruits or masses. Good bowel sounds. Extremities:  no cyanosis, clubbing, rash, edema Neuro: alert & orientedx3, cranial nerves grossly intact. Moves all 4 extremities w/o difficulty. Affect pleasant.  ASSESSMENT & PLAN: 1. 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 He has a history of alcohol and cocaine abuse, which likely contributes to cardiomyopathy.  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 Most recent echo in 4/23 showed EF 10-15%, global HK, severe LV dilation, severely enlarged RV with severely decreased systolic function, severe biatrial enlargement, moderate Dale, dilated IVC. LHC/RHC05/23: Low CI 1.53 and markedly high filling pressures, RCA was chronically occluded but he did not have obstructive disease on left.  - NYHA **** Volume ***. Continue torsemide 60 mg bid. - Continue digoxin 0.125 mg daily -  Continue Farxiga 10 mg daily. - Would not add Entresto or spiro with soft BP.  - Off BiDil with low BP. - Not candidate for ICD unless he can cut back on ETOH abuse.  He has stopped cocaine. Narrow QRS so no CRT.  - Low output HF but with substance abuse, poor compliance, and living situation (basically homeless, lives with friends or in hotels), he is not a candidate for advanced therapies.  Would also be poor home milrinone candidate.  - Reinforced medication compliance.  - Check BMET   2. CAD: LHC in 7/20 and 5/23 showed CTO of RCA treated medically, left system ok. Suspect cardiomyopathy is primarily nonischemic.  - Continue ASA 81 daily + atorvastatin 40 mg daily.  3. Cocaine Use Disorder: Has been off cocaine for several years.  4. Alcohol Use Disorder: Discussed cessation. 5. CKD stage 3: Check BMET  7. SDOH: uses public transportation. Plans to establish with PCP next month. Given bus passes.   Follow up ***  Elianys Conry N PA-C 4:56 PM

## 2023-02-26 NOTE — Telephone Encounter (Signed)
Called and left patient a voice message to confirm/remind patient of their appointment at the [facility] on 02/27/23.     

## 2023-02-27 ENCOUNTER — Ambulatory Visit (HOSPITAL_COMMUNITY)
Admission: RE | Admit: 2023-02-27 | Discharge: 2023-02-27 | Disposition: A | Discharge status: Home / Self Care | Payer: Medicaid Other | Source: Ambulatory Visit | Attending: Physician Assistant | Admitting: Physician Assistant

## 2023-02-27 ENCOUNTER — Encounter (HOSPITAL_COMMUNITY): Payer: Self-pay

## 2023-02-27 VITALS — BP 124/90 | HR 97 | Wt 248.0 lb

## 2023-02-27 DIAGNOSIS — Z7982 Long term (current) use of aspirin: Secondary | ICD-10-CM | POA: Insufficient documentation

## 2023-02-27 DIAGNOSIS — I5022 Chronic systolic (congestive) heart failure: Secondary | ICD-10-CM

## 2023-02-27 DIAGNOSIS — Z79899 Other long term (current) drug therapy: Secondary | ICD-10-CM | POA: Diagnosis not present

## 2023-02-27 DIAGNOSIS — I959 Hypotension, unspecified: Secondary | ICD-10-CM | POA: Diagnosis not present

## 2023-02-27 DIAGNOSIS — Z5900 Homelessness unspecified: Secondary | ICD-10-CM | POA: Diagnosis not present

## 2023-02-27 DIAGNOSIS — I2582 Chronic total occlusion of coronary artery: Secondary | ICD-10-CM | POA: Diagnosis not present

## 2023-02-27 DIAGNOSIS — I2510 Atherosclerotic heart disease of native coronary artery without angina pectoris: Secondary | ICD-10-CM | POA: Insufficient documentation

## 2023-02-27 DIAGNOSIS — F1410 Cocaine abuse, uncomplicated: Secondary | ICD-10-CM | POA: Diagnosis not present

## 2023-02-27 DIAGNOSIS — Z91148 Patient's other noncompliance with medication regimen for other reason: Secondary | ICD-10-CM | POA: Diagnosis not present

## 2023-02-27 DIAGNOSIS — F1010 Alcohol abuse, uncomplicated: Secondary | ICD-10-CM | POA: Diagnosis not present

## 2023-02-27 DIAGNOSIS — I130 Hypertensive heart and chronic kidney disease with heart failure and stage 1 through stage 4 chronic kidney disease, or unspecified chronic kidney disease: Secondary | ICD-10-CM | POA: Insufficient documentation

## 2023-02-27 DIAGNOSIS — R112 Nausea with vomiting, unspecified: Secondary | ICD-10-CM

## 2023-02-27 DIAGNOSIS — F1290 Cannabis use, unspecified, uncomplicated: Secondary | ICD-10-CM | POA: Diagnosis not present

## 2023-02-27 DIAGNOSIS — N183 Chronic kidney disease, stage 3 unspecified: Secondary | ICD-10-CM | POA: Insufficient documentation

## 2023-02-27 LAB — HEPATIC FUNCTION PANEL
ALT: 17 U/L (ref 0–44)
AST: 20 U/L (ref 15–41)
Albumin: 3.7 g/dL (ref 3.5–5.0)
Alkaline Phosphatase: 84 U/L (ref 38–126)
Bilirubin, Direct: 0.2 mg/dL (ref 0.0–0.2)
Indirect Bilirubin: 1.7 mg/dL — ABNORMAL HIGH (ref 0.3–0.9)
Total Bilirubin: 1.9 mg/dL — ABNORMAL HIGH (ref 0.3–1.2)
Total Protein: 7.7 g/dL (ref 6.5–8.1)

## 2023-02-27 LAB — DIGOXIN LEVEL: Digoxin Level: 1.1 ng/mL (ref 0.8–2.0)

## 2023-02-27 LAB — BASIC METABOLIC PANEL
Anion gap: 10 (ref 5–15)
BUN: 25 mg/dL — ABNORMAL HIGH (ref 6–20)
CO2: 26 mmol/L (ref 22–32)
Calcium: 8.8 mg/dL — ABNORMAL LOW (ref 8.9–10.3)
Chloride: 104 mmol/L (ref 98–111)
Creatinine, Ser: 1.74 mg/dL — ABNORMAL HIGH (ref 0.61–1.24)
GFR, Estimated: 45 mL/min — ABNORMAL LOW (ref >60)
Glucose, Bld: 113 mg/dL — ABNORMAL HIGH (ref 70–99)
Potassium: 3.9 mmol/L (ref 3.5–5.1)
Sodium: 140 mmol/L (ref 135–145)

## 2023-02-27 LAB — LACTIC ACID, PLASMA: Lactic Acid, Venous: 1.4 mmol/L (ref 0.5–1.9)

## 2023-02-27 MED ORDER — SPIRONOLACTONE 25 MG PO TABS: 12.5000 mg | ORAL_TABLET | Freq: Every day | ORAL | 3 refills | Status: DC

## 2023-02-27 NOTE — Progress Notes (Signed)
H&V Care Navigation CSW Progress Note  Clinical Social Worker met with patient to assist with resources.  Patient is participating in a Managed Medicaid Plan:  No  Patient is well known to Care Navigation Team. Patient requesting assistance with bus passes and stated that he is in need of food. Patient also shared that he continues to drink alcohol and "I need help with my mental health too". He states that he has stable housing with his friend and has access to the kitchen. He reports he doesn't always eat healthy foods because they are too expensive and it is cheaper to eat fast food. CSW discussed Nutritional Workshop next week and offered some resources for healthy eating.  CSW discussed concerns of alcohol use and patient states "I am from [place] and have been drinking my whole life. I can cut down." CSW shared concerns about withdrawal symptoms and need for possible detox. Patient adamantly denied. CSW further discussed going to [facility] for an assessment as they are open 24/7 for mental health and substance use treatments.  CSW provided patient with bus passes, H&V Food Bag, Nutritional Workshop information and the contact information for [facility]. Patient grateful for the resources and support. [name], LCSW, CCSW-MCS [phone]   SDOH Screenings   Food Insecurity: Food Insecurity Present (02/27/2023)  Housing: Medium Risk (02/16/2023)  Transportation Needs: Unmet Transportation Needs (02/27/2023)  Utilities: Not At Risk (02/17/2023)  Alcohol Screen: High Risk (02/20/2022)  Depression (PHQ2-9): Medium Risk (05/05/2019)  Financial Resource Strain: High Risk (02/20/2022)  Tobacco Use: Low Risk  (02/27/2023)

## 2023-02-27 NOTE — Patient Instructions (Addendum)
Thank you for coming in today  Labs were done today, if any labs are abnormal the clinic will call you No news is good news  Medications: START Spironolactone 12.5 mg 1/2 tablet daily  INCREASE Torsemide  80 mg twice daily for 5 days then go back to 60 mg twice daily INCREASE Potassium 60 meq twice daily for 5 days then back to 40 meq twice daily   Follow up appointments:  Your physician recommends that you return for lab work in:  1 week  Your physician recommends that you schedule a follow-up appointment in: 2 weeks in clinic   Do the following things EVERYDAY: Weigh yourself in the morning before breakfast. Write it down and keep it in a log. Take your medicines as prescribed Eat low salt foods--Limit salt (sodium) to 2000 mg per day.  Stay as active as you can everyday Limit all fluids for the day to less than 2 liters   At the [facility], you and your health needs are our priority. As part of our continuing mission to provide you with exceptional heart care, we have created designated Provider Care Teams. These Care Teams include your primary Cardiologist (physician) and Advanced Practice Providers (APPs- Physician Assistants and Nurse Practitioners) who all work together to provide you with the care you need, when you need it.   You may see any of the following providers on your designated Care Team at your next follow up: Dr Arvilla Meres Dr Marca Ancona Dr. Marcos Eke, NP Robbie Lis, [place] [facility] [place], [place] Brynda Peon, NP Karle Plumber, PharmD   Please be sure to bring in all your medications bottles to every appointment.    Thank you for choosing [facility]  If you have any questions or concerns before your next appointment please send [place] a message through [place] or call our office at [phone].    TO LEAVE A MESSAGE FOR THE NURSE SELECT OPTION 2, PLEASE LEAVE  A MESSAGE INCLUDING: YOUR NAME DATE OF BIRTH CALL BACK NUMBER REASON FOR CALL**this is important as we prioritize the call backs  YOU WILL RECEIVE A CALL BACK THE SAME DAY AS LONG AS YOU CALL BEFORE 4:00 PM

## 2023-02-27 NOTE — Progress Notes (Signed)
ReDS Vest / Clip - 02/27/23 1000       ReDS Vest / Clip   Station Marker C    Ruler Value 28    ReDS Value Range High volume overload    ReDS Actual Value 41

## 2023-03-05 ENCOUNTER — Telehealth (HOSPITAL_COMMUNITY): Payer: Self-pay | Admitting: *Deleted

## 2023-03-07 ENCOUNTER — Ambulatory Visit (HOSPITAL_COMMUNITY)
Admission: RE | Admit: 2023-03-07 | Discharge: 2023-03-07 | Disposition: A | Discharge status: Home / Self Care | Payer: Medicaid Other | Source: Ambulatory Visit | Attending: Cardiology | Admitting: Cardiology

## 2023-03-07 DIAGNOSIS — I5022 Chronic systolic (congestive) heart failure: Secondary | ICD-10-CM | POA: Diagnosis not present

## 2023-03-07 LAB — BASIC METABOLIC PANEL
Anion gap: 11 (ref 5–15)
BUN: 27 mg/dL — ABNORMAL HIGH (ref 6–20)
CO2: 24 mmol/L (ref 22–32)
Calcium: 8.5 mg/dL — ABNORMAL LOW (ref 8.9–10.3)
Chloride: 103 mmol/L (ref 98–111)
Creatinine, Ser: 1.68 mg/dL — ABNORMAL HIGH (ref 0.61–1.24)
GFR, Estimated: 47 mL/min — ABNORMAL LOW (ref >60)
Glucose, Bld: 94 mg/dL (ref 70–99)
Potassium: 4.3 mmol/L (ref 3.5–5.1)
Sodium: 138 mmol/L (ref 135–145)

## 2023-03-07 LAB — LIPASE, BLOOD: Lipase: 37 U/L (ref 11–51)

## 2023-03-10 NOTE — Telephone Encounter (Signed)
Error

## 2023-03-12 ENCOUNTER — Telehealth (HOSPITAL_COMMUNITY): Payer: Self-pay

## 2023-03-12 NOTE — Telephone Encounter (Signed)
Called to confirm/remind patient of their appointment at the [facility] on 03/13/23.

## 2023-03-13 ENCOUNTER — Encounter (HOSPITAL_COMMUNITY): Payer: Medicaid Other

## 2023-03-14 ENCOUNTER — Telehealth (HOSPITAL_COMMUNITY): Payer: Self-pay

## 2023-03-14 NOTE — Telephone Encounter (Signed)
Called and left a voice message on [employer] answering machine to confirm/remind patient of their appointment at the [facility] on 03/17/23.

## 2023-03-17 ENCOUNTER — Encounter (HOSPITAL_COMMUNITY): Payer: Medicaid Other

## 2023-04-02 ENCOUNTER — Telehealth (HOSPITAL_COMMUNITY): Payer: Self-pay

## 2023-04-02 NOTE — Telephone Encounter (Signed)
Called and spoke to patient's mother to confirm/remind patient of their appointment at the [facility] on 04/03/23.   Patient reminded to bring all medications and/or complete list.  Confirmed patient has transportation. Gave directions, instructed to utilize valet parking.  Confirmed appointment prior to ending call.

## 2023-04-03 ENCOUNTER — Ambulatory Visit (HOSPITAL_COMMUNITY)
Admission: RE | Admit: 2023-04-03 | Discharge: 2023-04-03 | Disposition: A | Discharge status: Home / Self Care | Payer: Medicaid Other | Source: Ambulatory Visit | Attending: Family Medicine | Admitting: Family Medicine

## 2023-04-03 ENCOUNTER — Encounter (HOSPITAL_COMMUNITY): Payer: Self-pay

## 2023-04-03 VITALS — BP 92/66 | HR 91 | Wt 246.8 lb

## 2023-04-03 DIAGNOSIS — F1411 Cocaine abuse, in remission: Secondary | ICD-10-CM | POA: Insufficient documentation

## 2023-04-03 DIAGNOSIS — Z139 Encounter for screening, unspecified: Secondary | ICD-10-CM

## 2023-04-03 DIAGNOSIS — I5022 Chronic systolic (congestive) heart failure: Secondary | ICD-10-CM | POA: Insufficient documentation

## 2023-04-03 DIAGNOSIS — N183 Chronic kidney disease, stage 3 unspecified: Secondary | ICD-10-CM | POA: Insufficient documentation

## 2023-04-03 DIAGNOSIS — Z5901 Sheltered homelessness: Secondary | ICD-10-CM | POA: Insufficient documentation

## 2023-04-03 DIAGNOSIS — F1290 Cannabis use, unspecified, uncomplicated: Secondary | ICD-10-CM | POA: Insufficient documentation

## 2023-04-03 DIAGNOSIS — Z5986 Financial insecurity: Secondary | ICD-10-CM | POA: Diagnosis not present

## 2023-04-03 DIAGNOSIS — I255 Ischemic cardiomyopathy: Secondary | ICD-10-CM | POA: Diagnosis not present

## 2023-04-03 DIAGNOSIS — F1010 Alcohol abuse, uncomplicated: Secondary | ICD-10-CM

## 2023-04-03 DIAGNOSIS — Z7982 Long term (current) use of aspirin: Secondary | ICD-10-CM | POA: Insufficient documentation

## 2023-04-03 DIAGNOSIS — Z79899 Other long term (current) drug therapy: Secondary | ICD-10-CM | POA: Diagnosis not present

## 2023-04-03 DIAGNOSIS — Z5982 Transportation insecurity: Secondary | ICD-10-CM | POA: Diagnosis not present

## 2023-04-03 DIAGNOSIS — F1410 Cocaine abuse, uncomplicated: Secondary | ICD-10-CM | POA: Diagnosis not present

## 2023-04-03 DIAGNOSIS — I2510 Atherosclerotic heart disease of native coronary artery without angina pectoris: Secondary | ICD-10-CM | POA: Diagnosis not present

## 2023-04-03 DIAGNOSIS — I130 Hypertensive heart and chronic kidney disease with heart failure and stage 1 through stage 4 chronic kidney disease, or unspecified chronic kidney disease: Secondary | ICD-10-CM | POA: Insufficient documentation

## 2023-04-03 DIAGNOSIS — R112 Nausea with vomiting, unspecified: Secondary | ICD-10-CM

## 2023-04-03 LAB — MAGNESIUM: Magnesium: 2 mg/dL (ref 1.7–2.4)

## 2023-04-03 LAB — BASIC METABOLIC PANEL
Anion gap: 9 (ref 5–15)
BUN: 23 mg/dL — ABNORMAL HIGH (ref 6–20)
CO2: 30 mmol/L (ref 22–32)
Calcium: 8.2 mg/dL — ABNORMAL LOW (ref 8.9–10.3)
Chloride: 98 mmol/L (ref 98–111)
Creatinine, Ser: 1.89 mg/dL — ABNORMAL HIGH (ref 0.61–1.24)
GFR, Estimated: 40 mL/min — ABNORMAL LOW (ref >60)
Glucose, Bld: 91 mg/dL (ref 70–99)
Potassium: 3.6 mmol/L (ref 3.5–5.1)
Sodium: 137 mmol/L (ref 135–145)

## 2023-04-03 LAB — DIGOXIN LEVEL: Digoxin Level: 0.6 ng/mL — ABNORMAL LOW (ref 0.8–2.0)

## 2023-04-03 MED ORDER — ONDANSETRON HCL 4 MG PO TABS: 4.0000 mg | ORAL_TABLET | Freq: Three times a day (TID) | ORAL | 1 refills | Status: DC | PRN

## 2023-04-03 NOTE — Progress Notes (Signed)
H&V Care Navigation CSW Progress Note  Clinical Social Worker saw pt in clinic to provide with bus passes to get home.  No further needs at this time.   SDOH Screenings   Food Insecurity: Food Insecurity Present (02/27/2023)  Housing: Medium Risk (02/16/2023)  Transportation Needs: Unmet Transportation Needs (02/27/2023)  Utilities: Not At Risk (02/17/2023)  Alcohol Screen: High Risk (02/20/2022)  Depression (PHQ2-9): Medium Risk (05/05/2019)  Financial Resource Strain: High Risk (02/20/2022)  Tobacco Use: Low Risk  (04/03/2023)     Burna Sis, LCSW Clinical Social Worker [facility] Desk#: [phone] Cell#: [phone]

## 2023-04-03 NOTE — Progress Notes (Signed)
PCP: Dr. Alvis Lemmings HF Cardiologist: Dr [name]   HPI: [name] is a 61 y.o.with a history of HTN, polysubstance abuse, ETOH abuse, mixed NICM/ICM, and chronic HFrEF. Severely reduced EF dating back to 2019.   Has had multiple admit for HF exacerbation frequently associated with medication noncompliance.     Echo 02/2022 - EF 10-15%, global HK, severe LV dilation, severely enlarged RV with severely decreased systolic function, severe biatrial enlargement, moderate MR, dilated IVC    R/LHC (5/23): occluded pRCA w/ collaterals, nonobstructive disease in the left system, markedly elevated filling pressures,  pulmonary venous hypertension, low CO.RA mean 23, RV 50/21, PA 53/32, mean 40, PCWP mean 39, LV 117/35, AO 117/82, CO/CI (Fick): 3.5/1.53, PVR < 1 WU, Oxygen saturations: PA 47%, AO 97%.   Previously followed by HF Paramedicine. Discharged 12/13/22 due to difficulty contacting him, ETOH abuse, and ongoing noncompliance.   Presented to [facility] 01/22/23 with increased dyspnea. Out of HF meds for 3-4 days. Diuresed with IV lasix and sent home later that day. [facility] verified he had medications ready for pick up or delivery.   Readmitted 4/24 with acute on chronic CHF. Still noncompliant with medications. Home HF medications were restarted. Intractable nausea and vomiting also noted and felt to be secondary to GERD. Improvement noted with protonix.  Post hospital follow up 4/24, volume overloaded and ReDs 41%. Torsemide increased and spiro 12.5 started. Planned for close follow up in a week but he cancelled his appt, and no showed the next.  Today he returns for HF follow up. Overall feeling better. He has SOB walking further distance on flat ground, but this is improved from last visit with increased diuretic dose. Struggling with hiccups and vomiting. Vomits after most meals, most days of the week, not constipated. Consistently taking PPI. He has dizziness, no falls. Denies palpitations, abnormal  bleeding, edema, or PND/Orthopnea. Appetite ok. No fever or chills. Weight at home 247 pounds. Taking all medications. Drinks 32 oz of beer/week, cut back a lot. Smokes THC daily. Staying with friends for right now.  ReDs: 35%  Labs (9/22): K 3.9, creatinine 1.67 Labs (4/23): K 3.6, creatinine 1.69, hgb 12.8, LDL 87 Labs (5/24): K 4.3, creatinine 1.68  PMH: 1. ETOH abuse.  2. Cocaine abuse: Has been off cocaine x months.  3. CAD: Cath in 7/20 with totally occluded RCA with collaterals.  - NSTEMI in 7/22 with HS-TnI > 1000 but no cath.  4. CKD stage 3 5. HTN 6. Hyperlipidemia 7. Chronic systolic CHF: Suspect mixed ischemic/nonischemic cardiomyopathy.  See cath report as above from 7/20.  He continues to drink ETOH heavily, used cocaine in the past.  Both could contribute.  - Echo (4/23): EF 10-15%, global HK, severe LV dilation, severely enlarged RV with severely decreased systolic function, severe biatrial enlargement, moderate MR, dilated IVC  ROS: All systems negative except as listed in HPI, PMH and Problem List.  SH:  Social History   Socioeconomic History   Marital status: Single    Spouse name: none   Number of children: Not on file   Years of education: Not on file   Highest education level: Not on file  Occupational History   Occupation: Disabled  Tobacco Use   Smoking status: Never   Smokeless tobacco: Never  Vaping Use   Vaping Use: Never used  Substance and Sexual Activity   Alcohol use: Yes    Comment: 1 quarts a week   Drug use: Not Currently  Frequency: 3.0 times per week    Types: Marijuana, Cocaine   Sexual activity: Not Currently  Other Topics Concern   Not on file  Social History Narrative   Lives w/ mother in [place] but recently visiting w/ dtr in [place].   Social Determinants of Health   Financial Resource Strain: High Risk (02/20/2022)   Overall Financial Resource Strain (CARDIA)    Difficulty of Paying Living Expenses: Hard  Food Insecurity:  Food Insecurity Present (02/27/2023)   Hunger Vital Sign    Worried About Running Out of Food in the Last Year: Sometimes true    Ran Out of Food in the Last Year: Sometimes true  Transportation Needs: Unmet Transportation Needs (02/27/2023)   PRAPARE - Administrator, Civil Service (Medical): Yes    Lack of Transportation (Non-Medical): Yes  Physical Activity: Not on file  Stress: Not on file  Social Connections: Not on file  Intimate Partner Violence: Not At Risk (02/16/2023)   Humiliation, Afraid, Rape, and Kick questionnaire    Fear of Current or Ex-Partner: No    Emotionally Abused: No    Physically Abused: No    Sexually Abused: No    FH:  Family History  Problem Relation Age of Onset   Hypertension Maternal Grandmother     Past Medical History:  Diagnosis Date   Alcohol abuse    CAD (coronary artery disease)    a. 05/2019 Cath: LM nl, LAD 15p, 10m, LCX large/nl, OM2 30, OM3 20, RCA 100 CTO w/ bridging L->R collats to RPDA.   Chronic combined systolic and diastolic CHF (congestive heart failure) (HCC)    a. 09/27/18 Echo: EF 20-25%, grade 2 DD; b. 03/2019 Echo: EF 20-25%; c. 05/2021 Echo: EF <10%, glob HK. GrII DD. Sev red RV fxn. Sev BAE. Mild MR. Mild-mod TR.   CKD (chronic kidney disease), stage II    Cocaine abuse (HCC)    Hyperlipidemia LDL goal <70    Hypertension    Ischemic cardiomyopathy    a. 09/27/18 Echo: EF 20-25%, grade 2 DD; b. 03/2019 Echo: EF 20-25%; c. 05/2019 Cath: RCA 100 CTA, otw nonobs dzs; d. 05/2021 Echo: EF <10%, glob HK. GrII DD. Sev red RV fxn.   Morbid obesity (HCC)    Noncompliance    Normocytic anemia    Current Outpatient Medications  Medication Sig Dispense Refill   aspirin EC 81 MG tablet Take 1 tablet (81 mg total) by mouth daily. Swallow whole. 30 tablet 11   atorvastatin (LIPITOR) 40 MG tablet TAKE ONE TABLET BY MOUTH ONCE DAILY FOR CHOLESTEROL 90 tablet 3   digoxin (LANOXIN) 0.125 MG tablet TAKE ONE TABLET BY MOUTH ONCE DAILY  90 tablet 3   FARXIGA 10 MG TABS tablet TAKE ONE TABLET BY MOUTH ONCE DAILY 90 tablet 3   pantoprazole (PROTONIX) 40 MG tablet Take 1 tablet (40 mg total) by mouth daily. 30 tablet 0   potassium chloride SA (KLOR-CON M) 20 MEQ tablet Take 2 tablets (40 mEq total) by mouth 2 (two) times daily. 120 tablet 8   spironolactone (ALDACTONE) 25 MG tablet Take 0.5 tablets (12.5 mg total) by mouth daily. 45 tablet 3   torsemide (DEMADEX) 20 MG tablet Take 3 tablets (60 mg total) by mouth 2 (two) times daily. 180 tablet 8   No current facility-administered medications for this encounter.   BP 92/66   Pulse 91   Wt 111.9 kg (246 lb 12.8 oz)   SpO2 98%  BMI 34.42 kg/m   Wt Readings from Last 3 Encounters:  04/03/23 111.9 kg (246 lb 12.8 oz)  02/27/23 112.5 kg (248 lb)  02/18/23 108.3 kg (238 lb 11.2 oz)   PHYSICAL EXAM: General:  NAD. No resp difficulty, walked into clinic HEENT: Normal Neck: Supple. No JVD. Carotids 2+ bilat; no bruits. No lymphadenopathy or thryomegaly appreciated. Cor: PMI nondisplaced. Regular rate & rhythm. No rubs, gallops or murmurs. Lungs: Clear Abdomen: Soft, obese, nontender, nondistended. No hepatosplenomegaly. No bruits or masses. Good bowel sounds. Extremities: No cyanosis, clubbing, rash, edema Neuro: Alert & oriented x 3, cranial nerves grossly intact. Moves all 4 extremities w/o difficulty. Affect pleasant.  ASSESSMENT & PLAN: 1. 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 He has a history of alcohol and cocaine abuse, which likely contributes to cardiomyopathy.  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 Most recent echo 4/23 showed EF 10-15%, global HK, severe LV dilation, severely enlarged RV with severely decreased systolic function, severe biatrial enlargement, moderate MR, dilated IVC. LHC/RHC05/23: Low CI 1.53 and markedly high filling pressures, RCA was chronically occluded but he did not have obstructive disease on left. NYHA  III, improved. He is not volume overloaded today, ReDs 35% - With elevated dig level (unclear if this was a trough), and dizziness, will stop digoxin. - Continue torsemide 60 mg bid + 40 KCL bid. BMET and BNP today. - Continue spiro 12.5 mg daily - Continue Farxiga 10 mg daily. - Off Entresto d/t hypotension and AKI. BP too low to add today. - Off BiDil with low BP. - Not candidate for ICD unless he can cut back on ETOH abuse.  He has stopped cocaine. Narrow QRS so no CRT.  - Not an advanced therapy candidate with substance abuse, poor compliance, and living situation (basically homeless, lives with friends or in hotels). Would also be poor home milrinone candidate.  - Update echo next visit. 2. CAD: LHC in 7/20 and 5/23 showed CTO of RCA treated medically, left system ok. Suspect cardiomyopathy is primarily nonischemic. No chest pain. - Continue ASA 81 daily + atorvastatin 40 mg daily.  3. Cocaine Use Disorder: Has been off cocaine for several years.  4. Alcohol Use Disorder: Discussed cessation. Cut back from 40 oz liquor/week to 32 oz beer/week. He has been [facility] to [facility], at his request, for substance abuse counseling. 5. CKD stage 3: Continue SGLT2i. BMET today. 6. SDOH: Seen by CSW. Uses public transportation. Given bus passes.  7. N/V: on-going. Suspect combination of HF, ETOH and heavy THC use. He does not appear to be clinically low output today, hepatic panel, lipase and lactic acid were stable at last visit. Needs to cut back on ETOH. - Sounds a bit like hiatal hernia. Continue PPI. - He has PCP follow up soon, may need referral to GI. - Will give PRN Zofran 4 mg until he can be seen. QTC ok on last ECG.  Follow up in 2-3 months with Dr. [name] + echo.  Dale Malta Yvaine Jankowiak FNP-BC 2:29 PM

## 2023-04-03 NOTE — Progress Notes (Signed)
ReDS Vest / Clip - 04/03/23 1400       ReDS Vest / Clip   Station Marker C    Ruler Value 27    ReDS Value Range Low volume    ReDS Actual Value 35

## 2023-04-03 NOTE — Patient Instructions (Addendum)
Thank you for coming in today  If you had labs drawn today, any labs that are abnormal the clinic will call you No news is good news  Medications: STOP Digoxin START Zofran 4 mg every 8 hours as needed for nausea and/or vomiting  Follow up appointments:  Your physician recommends that you schedule a follow-up appointment in:  2 months With Dr. [name] with echocardiogram  Your physician has requested that you have an echocardiogram. Echocardiography is a painless test that uses sound waves to create images of your heart. It provides your doctor with information about the size and shape of your heart and how well your heart's chambers and valves are working. This procedure takes approximately one hour. There are no restrictions for this procedure.      Do the following things EVERYDAY: Weigh yourself in the morning before breakfast. Write it down and keep it in a log. Take your medicines as prescribed Eat low salt foods--Limit salt (sodium) to 2000 mg per day.  Stay as active as you can everyday Limit all fluids for the day to less than 2 liters   At the [facility], you and your health needs are our priority. As part of our continuing mission to provide you with exceptional heart care, we have created designated Provider Care Teams. These Care Teams include your primary Cardiologist (physician) and Advanced Practice Providers (APPs- Physician Assistants and Nurse Practitioners) who all work together to provide you with the care you need, when you need it.   You may see any of the following providers on your designated Care Team at your next follow up: Dr Arvilla Meres Dr Marca Ancona Dr. Marcos Eke, NP Robbie Lis, [place] [facility], [place] Brynda Peon, NP Karle Plumber, PharmD   Please be sure to bring in all your medications bottles to every appointment.    Thank you for choosing [facility]  If you have any questions or concerns before your next appointment please send [place] a message through [place] or call our office at [phone].    TO LEAVE A MESSAGE FOR THE NURSE SELECT OPTION 2, PLEASE LEAVE A MESSAGE INCLUDING: YOUR NAME DATE OF BIRTH CALL BACK NUMBER REASON FOR CALL**this is important as we prioritize the call backs  YOU WILL RECEIVE A CALL BACK THE SAME DAY AS LONG AS YOU CALL BEFORE 4:00 PM

## 2023-04-28 ENCOUNTER — Other Ambulatory Visit (HOSPITAL_COMMUNITY): Payer: Self-pay | Admitting: Cardiology

## 2023-05-27 ENCOUNTER — Ambulatory Visit: Payer: Medicaid Other | Admitting: Family Medicine

## 2023-06-04 ENCOUNTER — Other Ambulatory Visit (HOSPITAL_COMMUNITY): Payer: Self-pay | Admitting: Cardiology

## 2023-06-04 ENCOUNTER — Ambulatory Visit (HOSPITAL_BASED_OUTPATIENT_CLINIC_OR_DEPARTMENT_OTHER)
Admission: RE | Admit: 2023-06-04 | Discharge: 2023-06-04 | Disposition: A | Discharge status: Home / Self Care | Payer: 59 | Source: Ambulatory Visit | Attending: Cardiology | Admitting: Cardiology

## 2023-06-04 ENCOUNTER — Telehealth (HOSPITAL_COMMUNITY): Payer: Self-pay | Admitting: Licensed Clinical Social Worker

## 2023-06-04 ENCOUNTER — Other Ambulatory Visit: Payer: Self-pay

## 2023-06-04 ENCOUNTER — Ambulatory Visit (HOSPITAL_COMMUNITY)
Admission: RE | Admit: 2023-06-04 | Discharge: 2023-06-04 | Disposition: A | Discharge status: Home / Self Care | Payer: 59 | Source: Ambulatory Visit | Attending: Cardiology | Admitting: Cardiology

## 2023-06-04 ENCOUNTER — Encounter (HOSPITAL_COMMUNITY): Payer: Self-pay | Admitting: Cardiology

## 2023-06-04 VITALS — BP 104/70 | HR 86 | Wt 246.0 lb

## 2023-06-04 DIAGNOSIS — Z79899 Other long term (current) drug therapy: Secondary | ICD-10-CM | POA: Insufficient documentation

## 2023-06-04 DIAGNOSIS — I5022 Chronic systolic (congestive) heart failure: Secondary | ICD-10-CM | POA: Insufficient documentation

## 2023-06-04 DIAGNOSIS — I130 Hypertensive heart and chronic kidney disease with heart failure and stage 1 through stage 4 chronic kidney disease, or unspecified chronic kidney disease: Secondary | ICD-10-CM | POA: Insufficient documentation

## 2023-06-04 DIAGNOSIS — F1010 Alcohol abuse, uncomplicated: Secondary | ICD-10-CM | POA: Insufficient documentation

## 2023-06-04 DIAGNOSIS — I2510 Atherosclerotic heart disease of native coronary artery without angina pectoris: Secondary | ICD-10-CM | POA: Diagnosis present

## 2023-06-04 DIAGNOSIS — N529 Male erectile dysfunction, unspecified: Secondary | ICD-10-CM | POA: Diagnosis not present

## 2023-06-04 DIAGNOSIS — F1410 Cocaine abuse, uncomplicated: Secondary | ICD-10-CM | POA: Insufficient documentation

## 2023-06-04 DIAGNOSIS — R5383 Other fatigue: Secondary | ICD-10-CM | POA: Insufficient documentation

## 2023-06-04 DIAGNOSIS — N183 Chronic kidney disease, stage 3 unspecified: Secondary | ICD-10-CM | POA: Insufficient documentation

## 2023-06-04 DIAGNOSIS — I959 Hypotension, unspecified: Secondary | ICD-10-CM | POA: Insufficient documentation

## 2023-06-04 DIAGNOSIS — N179 Acute kidney failure, unspecified: Secondary | ICD-10-CM | POA: Insufficient documentation

## 2023-06-04 DIAGNOSIS — Z91148 Patient's other noncompliance with medication regimen for other reason: Secondary | ICD-10-CM | POA: Insufficient documentation

## 2023-06-04 LAB — BASIC METABOLIC PANEL
Anion gap: 15 (ref 5–15)
BUN: 17 mg/dL (ref 6–20)
CO2: 28 mmol/L (ref 22–32)
Calcium: 8.7 mg/dL — ABNORMAL LOW (ref 8.9–10.3)
Chloride: 99 mmol/L (ref 98–111)
Creatinine, Ser: 1.74 mg/dL — ABNORMAL HIGH (ref 0.61–1.24)
GFR, Estimated: 45 mL/min — ABNORMAL LOW (ref >60)
Glucose, Bld: 109 mg/dL — ABNORMAL HIGH (ref 70–99)
Potassium: 3.5 mmol/L (ref 3.5–5.1)
Sodium: 142 mmol/L (ref 135–145)

## 2023-06-04 LAB — ECHOCARDIOGRAM COMPLETE
Area-P 1/2: 5.38 cm2
Est EF: <20
S' Lateral: 8 cm
Single Plane A2C EF: 18.1 %

## 2023-06-04 LAB — BRAIN NATRIURETIC PEPTIDE: B Natriuretic Peptide: 2271.8 pg/mL — ABNORMAL HIGH (ref 0.0–100.0)

## 2023-06-04 MED ORDER — TORSEMIDE 20 MG PO TABS: 60.0000 mg | ORAL_TABLET | Freq: Two times a day (BID) | ORAL | 8 refills | Status: DC

## 2023-06-04 MED ORDER — DAPAGLIFLOZIN PROPANEDIOL 10 MG PO TABS: 10.0000 mg | ORAL_TABLET | Freq: Every day | ORAL | 3 refills | Status: DC

## 2023-06-04 MED ORDER — SILDENAFIL CITRATE 50 MG PO TABS: 50.0000 mg | ORAL_TABLET | Freq: Every day | ORAL | 0 refills | Status: DC | PRN

## 2023-06-04 MED ORDER — ASPIRIN 81 MG PO TBEC: 81.0000 mg | DELAYED_RELEASE_TABLET | Freq: Every day | ORAL | 11 refills | Status: AC

## 2023-06-04 MED ORDER — ATORVASTATIN CALCIUM 40 MG PO TABS: 40.0000 mg | ORAL_TABLET | Freq: Every day | ORAL | 3 refills | Status: DC

## 2023-06-04 MED ORDER — POTASSIUM CHLORIDE CRYS ER 20 MEQ PO TBCR: 40.0000 meq | EXTENDED_RELEASE_TABLET | Freq: Two times a day (BID) | ORAL | 8 refills | Status: DC

## 2023-06-04 MED ORDER — DIGOXIN 125 MCG PO TABS: 0.0625 mg | ORAL_TABLET | Freq: Every day | ORAL | 1 refills | Status: DC

## 2023-06-04 MED ORDER — SPIRONOLACTONE 25 MG PO TABS: 12.5000 mg | ORAL_TABLET | Freq: Every day | ORAL | 3 refills | Status: DC

## 2023-06-04 NOTE — Addendum Note (Signed)
Encounter addended by: Suezanne Cheshire, RN on: 06/04/2023 10:58 AM  Actions taken: Order list changed, Diagnosis association updated

## 2023-06-04 NOTE — Addendum Note (Signed)
Encounter addended by: Laurey Morale, MD on: 06/04/2023 10:04 PM  Actions taken: Clinical Note Signed, Level of Service modified

## 2023-06-04 NOTE — Progress Notes (Signed)
PCP: Dr. Alvis Lemmings HF Cardiologist: Dr [name]   HPI: Dale [name] is a 62 y.o.with a history of HTN, polysubstance abuse, ETOH abuse, mixed NICM/ICM, and chronic HFrEF. Severely reduced EF dating back to 2019.   Has had multiple admit for HF exacerbation frequently associated with medication noncompliance.     Echo 02/2022 - EF 10-15%, global HK, severe LV dilation, severely enlarged RV with severely decreased systolic function, severe biatrial enlargement, moderate Dale, dilated IVC    R/LHC (5/23): occluded pRCA w/ collaterals, nonobstructive disease in the left system, markedly elevated filling pressures,  pulmonary venous hypertension, low CO.RA mean 23, RV 50/21, PA 53/32, mean 40, PCWP mean 39, LV 117/35, AO 117/82, CO/CI (Fick): 3.5/1.53, PVR < 1 WU, Oxygen saturations: PA 47%, AO 97%.   Previously followed by HF Paramedicine. Discharged 12/13/22 due to difficulty contacting him, ETOH abuse, and ongoing noncompliance.   Presented to [facility] 01/22/23 with increased dyspnea. Out of HF meds for 3-4 days. Diuresed with IV lasix and sent home later that day.   Readmitted 4/24 with acute on chronic CHF. Still noncompliant with medications. Home HF medications were restarted. Intractable nausea and vomiting also noted and felt to be secondary to GERD. Improvement noted with protonix.  Post hospital follow up 4/24, volume overloaded and ReDs 41%. Torsemide increased and spiro 12.5 started. Planned for close follow up in a week but he cancelled his appt, and no showed the next.  Echo was done today and reviewed, EF < 20%, no LV thrombus, moderately decreased RV systolic function.   Today he returns for HF follow up. He has been out of all his medications for 3 days.  He feels fatigued and reports "tiredness" in his calves after walking 10-20 feet, has to rest.  He continues to have nausea in the mornings, this has gone on for a long time.  Zofran did not help.  He has 3 pillow orthopnea, worse over the last few  days.  Weight is stable. No chest pain.  He complains of erectile dysfunction. He still drinks ETOH heavily at times, but says that he has not used cocaine for > 1 year.   ECG (personally reviewed): NSR, 1st degree AVB 242 msec, PVC, IVCD 112 msec  Labs (9/22): K 3.9, creatinine 1.67 Labs (4/23): K 3.6, creatinine 1.69, hgb 12.8, LDL 87 Labs (5/24): K 4.3, creatinine 1.68  PMH: 1. ETOH abuse.  2. Cocaine abuse: Has been off cocaine x months.  3. CAD: Cath in 7/20 with totally occluded RCA with collaterals.  - NSTEMI in 7/22 with HS-TnI > 1000 but no cath.  - LHC (5/23): 40% stenosis mid LAD, CTO proximal RCA.  Medical management.  4. CKD stage 3 5. HTN 6. Hyperlipidemia 7. Chronic systolic CHF: Suspect mixed ischemic/nonischemic cardiomyopathy.  See cath report as above from 7/20.  He continues to drink ETOH heavily, used cocaine in the past.  Both could contribute.  - Echo (4/23): EF 10-15%, global HK, severe LV dilation, severely enlarged RV with severely decreased systolic function, severe biatrial enlargement, moderate Dale, dilated IVC - LHC/RHC (5/23): 40% stenosis mid LAD, CTO proximal RCA; mean RA 23, PA 53/32 mean 40, mean PCWP 39, CI 1.53, PVR < 1 WU.  - Echo (7/24): EF < 20%, no LV thrombus, moderately decreased RV systolic function.   ROS: All systems negative except as listed in HPI, PMH and Problem List.  SH:  Social History   Socioeconomic History   Marital status: Single  Spouse name: none   Number of children: Not on file   Years of education: Not on file   Highest education level: Not on file  Occupational History   Occupation: Disabled  Tobacco Use   Smoking status: Never   Smokeless tobacco: Never  Vaping Use   Vaping status: Never Used  Substance and Sexual Activity   Alcohol use: Yes    Comment: 1 quarts a week   Drug use: Not Currently    Frequency: 3.0 times per week    Types: Marijuana, Cocaine   Sexual activity: Not Currently  Other Topics  Concern   Not on file  Social History Narrative   Lives w/ mother in [place] but recently visiting w/ dtr in [place].   Social Determinants of Health   Financial Resource Strain: High Risk (02/20/2022)   Overall Financial Resource Strain (CARDIA)    Difficulty of Paying Living Expenses: Hard  Food Insecurity: Food Insecurity Present (02/27/2023)   Hunger Vital Sign    Worried About Running Out of Food in the Last Year: Sometimes true    Ran Out of Food in the Last Year: Sometimes true  Transportation Needs: Unmet Transportation Needs (06/04/2023)   PRAPARE - Administrator, Civil Service (Medical): Yes    Lack of Transportation (Non-Medical): Yes  Physical Activity: Not on file  Stress: Not on file  Social Connections: Not on file  Intimate Partner Violence: Not At Risk (02/16/2023)   Humiliation, Afraid, Rape, and Kick questionnaire    Fear of Current or Ex-Partner: No    Emotionally Abused: No    Physically Abused: No    Sexually Abused: No    FH:  Family History  Problem Relation Age of Onset   Hypertension Maternal Grandmother     Current Outpatient Medications  Medication Sig Dispense Refill   ondansetron (ZOFRAN) 4 MG tablet Take 1 tablet (4 mg total) by mouth every 8 (eight) hours as needed for nausea or vomiting. 18 tablet 1   pantoprazole (PROTONIX) 40 MG tablet Take 1 tablet (40 mg total) by mouth daily. 30 tablet 0   sildenafil (VIAGRA) 50 MG tablet Take 1 tablet (50 mg total) by mouth daily as needed for erectile dysfunction. 10 tablet 0   aspirin EC 81 MG tablet Take 1 tablet (81 mg total) by mouth daily. Swallow whole. 30 tablet 11   atorvastatin (LIPITOR) 40 MG tablet Take 1 tablet (40 mg total) by mouth daily. 90 tablet 3   dapagliflozin propanediol (FARXIGA) 10 MG TABS tablet Take 1 tablet (10 mg total) by mouth daily. 90 tablet 3   digoxin (LANOXIN) 0.125 MG tablet Take 0.5 tablets (0.0625 mg total) by mouth daily. 90 tablet 1   potassium chloride SA  (KLOR-CON M) 20 MEQ tablet Take 2 tablets (40 mEq total) by mouth 2 (two) times daily. 120 tablet 8   spironolactone (ALDACTONE) 25 MG tablet Take 0.5 tablets (12.5 mg total) by mouth daily. 45 tablet 3   torsemide (DEMADEX) 20 MG tablet Take 3 tablets (60 mg total) by mouth 2 (two) times daily. 180 tablet 8   No current facility-administered medications for this encounter.   BP 104/70   Pulse 86   Wt 111.6 kg (246 lb)   SpO2 98%   BMI 34.31 kg/m   Wt Readings from Last 3 Encounters:  06/04/23 111.6 kg (246 lb)  04/03/23 111.9 kg (246 lb 12.8 oz)  02/27/23 112.5 kg (248 lb)   PHYSICAL EXAM: General:  NAD Neck: JVP 10 cm with HJR, no thyromegaly or thyroid nodule.  Lungs: Clear to auscultation bilaterally with normal respiratory effort. CV: Nondisplaced PMI.  Heart regular S1/S2, no S3/S4, no murmur.  No peripheral edema.  No carotid bruit.  Difficult to palpate pedal pulses.  Abdomen: Soft, nontender, no hepatosplenomegaly, no distention.  Skin: Intact without lesions or rashes.  Neurologic: Alert and oriented x 3.  Psych: Normal affect. Extremities: No clubbing or cyanosis.  HEENT: Normal.   ASSESSMENT & PLAN: 1. 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 He has a history of alcohol and cocaine abuse, which likely contributes to cardiomyopathy.  LHC in 7/20 and again in 5/23 showed RCA chronic occlusion. RHC in 5/23 showed low output with CI 1.53 and markedly elevated filling pressures.  Most recent echo done today showed EF < 20%, no LV thrombus, moderately decreased RV systolic function. NYHA III, volume overloaded on exam.  He has been out of meds for 3 days. Still drinking ETOH heavily.  - Restart digoxin at lower dose, 0.0625 mg bid (high level on 0.125 mg). Digoxin level in 1 week.  - Restart torsemide 60 mg bid with KCl 40 bid. BMET/BNP today, BMET 1 week.   - Restart spironolactone 12.5 mg daily.  - Restart Farxiga 10 mg daily. - Off Entresto  due to hypotension and AKI.  - Off BiDil with low BP. - Not candidate for ICD unless he can cut back on ETOH abuse.  He has stopped cocaine. Narrow QRS so no CRT.  - Not an advanced therapies candidate with substance abuse, poor compliance, and living situation (basically homeless, lives with friends or in hotels). Would also be poor home milrinone candidate.  2. CAD: LHC in 7/20 and 5/23 showed CTO of RCA treated medically, left system ok. Suspect cardiomyopathy is primarily nonischemic. No chest pain. - Restart ASA 81 daily + atorvastatin 40 mg daily.  3. Cocaine Use Disorder: Has been off cocaine for > 1 year now.  4. Alcohol Use Disorder: Still drinking excessively.  Counseled him to quit.  5. CKD stage 3: BMET today, restart [place].  6. Nausea: Chronic.  Suspect combination of HF, ETOH and heavy THC use.  - Continue PPI.  - May need referral to GI, see PCP. 7. ?PAD: Significant fatigue in calves with walking and difficult to palpate pulses.  - Will arrange for peripheral arterial dopplers.  8. Erectile dysfunction: I will let him try Viagra 50 mg prn, warned about risk for hypotension and need to avoid NTG.   Follow up in 2 wks with APP.  Dale [name]  06/04/2023

## 2023-06-04 NOTE — Telephone Encounter (Signed)
H&V Care Navigation CSW Progress Note  Clinical Social Worker consulted to help with transportation to app.  CSW able to arrange through [employer].   SDOH Screenings   Food Insecurity: Food Insecurity Present (02/27/2023)  Housing: Medium Risk (02/16/2023)  Transportation Needs: Unmet Transportation Needs (06/04/2023)  Utilities: Not At Risk (02/17/2023)  Alcohol Screen: High Risk (02/20/2022)  Depression (PHQ2-9): Medium Risk (05/05/2019)  Financial Resource Strain: High Risk (02/20/2022)  Tobacco Use: Low Risk  (06/04/2023)    Burna Sis, LCSW Clinical Social Worker [facility] Desk#: [phone] Cell#: [phone]

## 2023-06-04 NOTE — Patient Instructions (Addendum)
We refilled your medication please take all medications as prescribed  Take Viagra 50 mg as needed  Lab work done today we will call you with any abnormal results  Your physician recommends that you return for lab work in: 10 days  Peripheral arterial dopplers have been ordered we will call to schedule test  Your physician recommends that you schedule a follow-up appointment in: as scheduled  If you have any questions or concerns before your next appointment please send [place] a message through [place] or call our office at [phone].    TO LEAVE A MESSAGE FOR THE NURSE SELECT OPTION 2, PLEASE LEAVE A MESSAGE INCLUDING: YOUR NAME DATE OF BIRTH CALL BACK NUMBER REASON FOR CALL**this is important as we prioritize the call backs  YOU WILL RECEIVE A CALL BACK THE SAME DAY AS LONG AS YOU CALL BEFORE 4:00 PM  At the [facility], you and your health needs are our priority. As part of our continuing mission to provide you with exceptional heart care, we have created designated Provider Care Teams. These Care Teams include your primary Cardiologist (physician) and Advanced Practice Providers (APPs- Physician Assistants and Nurse Practitioners) who all work together to provide you with the care you need, when you need it.   You may see any of the following providers on your designated Care Team at your next follow up: Dr Arvilla Meres Dr Marca Ancona Dr. Marcos Eke, NP Robbie Lis, [place] [facility] [place], [place] Brynda Peon, NP Karle Plumber, PharmD   Please be sure to bring in all your medications bottles to every appointment.    Thank you for choosing [facility]

## 2023-06-04 NOTE — Progress Notes (Signed)
  2D Echocardiogram has been performed.  Delcie Roch 06/04/2023, 9:57 AM

## 2023-06-05 ENCOUNTER — Other Ambulatory Visit: Payer: Self-pay | Admitting: Internal Medicine

## 2023-06-05 ENCOUNTER — Other Ambulatory Visit (HOSPITAL_COMMUNITY): Payer: Self-pay | Admitting: Family Medicine

## 2023-06-13 ENCOUNTER — Encounter (HOSPITAL_COMMUNITY): Payer: Self-pay | Admitting: Emergency Medicine

## 2023-06-13 ENCOUNTER — Emergency Department (HOSPITAL_COMMUNITY): Payer: 59

## 2023-06-13 ENCOUNTER — Emergency Department (HOSPITAL_COMMUNITY)
Admission: EM | Admit: 2023-06-13 | Discharge: 2023-06-13 | Disposition: A | Discharge status: Home / Self Care | Payer: 59 | Attending: Emergency Medicine | Admitting: Emergency Medicine

## 2023-06-13 ENCOUNTER — Other Ambulatory Visit: Payer: Self-pay

## 2023-06-13 DIAGNOSIS — Z79899 Other long term (current) drug therapy: Secondary | ICD-10-CM | POA: Diagnosis not present

## 2023-06-13 DIAGNOSIS — Z7982 Long term (current) use of aspirin: Secondary | ICD-10-CM | POA: Insufficient documentation

## 2023-06-13 DIAGNOSIS — K5900 Constipation, unspecified: Secondary | ICD-10-CM | POA: Diagnosis not present

## 2023-06-13 DIAGNOSIS — I509 Heart failure, unspecified: Secondary | ICD-10-CM | POA: Insufficient documentation

## 2023-06-13 DIAGNOSIS — I110 Hypertensive heart disease with heart failure: Secondary | ICD-10-CM | POA: Insufficient documentation

## 2023-06-13 MED ORDER — DOCUSATE SODIUM 100 MG PO CAPS
100.0000 mg | ORAL_CAPSULE | Freq: Once | ORAL | Status: AC
  Administered 2023-06-13: 100 mg via ORAL
  Filled 2023-06-13: qty 1

## 2023-06-13 NOTE — ED Triage Notes (Signed)
Patient endorses constipation x 1 day.  Patient had BM yesterday and does not want to try miralax.

## 2023-06-13 NOTE — ED Provider Notes (Signed)
[place] EMERGENCY DEPARTMENT AT [facility] Provider Note   CSN: [id number] Arrival date & time: 06/13/23  0120     History  Chief Complaint  Patient presents with   Constipation    Dale [name] is a 62 y.o. male.  The history is provided by the patient and medical records.  Constipation  62 y.o. M with hx of CHF, adjustment disorder, HTN, HLP, presenting to the ED for constipation.  States he was able to have BM yesterday but it was hard/firm and difficult to pass.  States today his abdomen has felt full like he needs to have another BM but has been unable to go.  Denies nausea/vomiting.  No fever/chills.  Denies chest pain or SOB.  He has not tried any medication for his symptoms.  Does admit that diet has been very poor lately-- not eaten any fruits/vegetables, mostly eating junk food.  States hx of hemorrhoids and is concerned about taking stool softeners because last time it made them bleed.  Home Medications Prior to Admission medications   Medication Sig Start Date End Date Taking? Authorizing Provider  aspirin EC 81 MG tablet Take 1 tablet (81 mg total) by mouth daily. Swallow whole. 06/04/23   Laurey Morale, MD  atorvastatin (LIPITOR) 40 MG tablet Take 1 tablet (40 mg total) by mouth daily. 06/04/23   Laurey Morale, MD  dapagliflozin propanediol (FARXIGA) 10 MG TABS tablet Take 1 tablet (10 mg total) by mouth daily. 06/04/23   Laurey Morale, MD  digoxin (LANOXIN) 0.125 MG tablet Take 0.5 tablets (0.0625 mg total) by mouth daily. 06/04/23   Laurey Morale, MD  ondansetron (ZOFRAN) 4 MG tablet Take 1 tablet (4 mg total) by mouth every 8 (eight) hours as needed for nausea or vomiting. 04/03/23 04/02/24  Jacklynn Ganong, FNP  pantoprazole (PROTONIX) 40 MG tablet Take 1 tablet (40 mg total) by mouth daily. 02/19/23   Mapp, Gaylyn Cheers, MD  potassium chloride SA (KLOR-CON M) 20 MEQ tablet Take 2 tablets (40 mEq total) by mouth 2 (two) times daily. 06/04/23    Laurey Morale, MD  sildenafil (VIAGRA) 50 MG tablet Take 1 tablet (50 mg total) by mouth daily as needed for erectile dysfunction. 06/04/23   Laurey Morale, MD  spironolactone (ALDACTONE) 25 MG tablet Take 0.5 tablets (12.5 mg total) by mouth daily. 06/04/23 05/29/24  Laurey Morale, MD  torsemide (DEMADEX) 20 MG tablet Take 3 tablets (60 mg total) by mouth 2 (two) times daily. 06/04/23 05/29/24  Laurey Morale, MD      Allergies    Hydrocodone    Review of Systems   Review of Systems  Gastrointestinal:  Positive for constipation.  All other systems reviewed and are negative.   Physical Exam Updated Vital Signs BP 101/75   Pulse 77   Temp 98.5 F (36.9 C) (Oral)   Resp 18   Ht 5\' 11"  (1.803 m)   Wt 113.4 kg   SpO2 97%   BMI 34.87 kg/m  Physical Exam Vitals and nursing note reviewed.  Constitutional:      Appearance: He is well-developed.  HENT:     Head: Normocephalic and atraumatic.  Eyes:     Conjunctiva/sclera: Conjunctivae normal.     Pupils: Pupils are equal, round, and reactive to light.  Cardiovascular:     Rate and Rhythm: Normal rate and regular rhythm.     Heart sounds: Normal heart sounds.  Pulmonary:  Effort: Pulmonary effort is normal.     Breath sounds: Normal breath sounds.  Abdominal:     General: Bowel sounds are normal.     Palpations: Abdomen is soft.     Tenderness: There is no abdominal tenderness.     Comments: Soft, no elicited tenderness, normal bowel sounds  Musculoskeletal:        General: Normal range of motion.     Cervical back: Normal range of motion.  Skin:    General: Skin is warm and dry.  Neurological:     Mental Status: He is alert and oriented to person, place, and time.     ED Results / Procedures / Treatments   Labs (all labs ordered are listed, but only abnormal results are displayed) Labs Reviewed - No data to display  EKG None  Radiology DG Abd 1 View  Result Date: 06/13/2023 CLINICAL DATA:   Constipation EXAM: ABDOMEN - 1 VIEW COMPARISON:  None Available. FINDINGS: Limited study due to body habitus. There is a non obstructive bowel gas pattern. No supine evidence of free air. No organomegaly or suspicious calcification. No acute bony abnormality. Moderate stool burden in the colon. IMPRESSION: Moderate stool burden.  No acute findings. Electronically Signed   By: Charlett Nose M.D.   On: 06/13/2023 01:49    Procedures Procedures    Medications Ordered in ED Medications  docusate sodium (COLACE) capsule 100 mg (has no administration in time range)    ED Course/ Medical Decision Making/ A&P                                 Medical Decision Making Amount and/or Complexity of Data Reviewed Radiology: ordered and independent interpretation performed.  Risk OTC drugs.   62 year old male presenting to the ED with constipation for 1 day.  States he had a bowel movement yesterday but was hard/firm and difficult to pass.  He denies any nausea or vomiting.  He is afebrile and nontoxic in appearance.  His abdomen is soft and nontender.  He has normal bowel sounds.  Denies any chest pain or shortness of breath.  No other associated symptoms.  Abdominal film obtained with moderate stool burden.  Patient does not want to take MiraLAX as he does not like the taste.  We discussed options of alternative bowel regimen including Dulcolax or Colace.  He states he has no money to buy any of these medications so was given first dose here.  He was encouraged to increase water and fiber intake to help ease bowel movements as well.  He can follow-up as an outpatient.  Return here for new concerns.  Final Clinical Impression(s) / ED Diagnoses Final diagnoses:  Constipation, unspecified constipation type    Rx / DC Orders ED Discharge Orders     None         Garlon Hatchet, PA-C 06/13/23 0220    Glynn Octave, MD 06/13/23 [phone]

## 2023-06-13 NOTE — Discharge Instructions (Signed)
Can continue over the counter colace or dulcolax to help with bowel movements.  Also recommend to increase fruits/vegetables and water intake.

## 2023-06-16 ENCOUNTER — Other Ambulatory Visit (HOSPITAL_COMMUNITY): Payer: 59

## 2023-06-17 ENCOUNTER — Telehealth (HOSPITAL_COMMUNITY): Payer: Self-pay

## 2023-06-17 ENCOUNTER — Telehealth (HOSPITAL_COMMUNITY): Payer: Self-pay | Admitting: Licensed Clinical Social Worker

## 2023-06-17 NOTE — Telephone Encounter (Signed)
CSW received message that patient is in need of transport for tomorrows appointment. CSW spoke with patient's mother and arranged transport with [employer] [name], LCSW, CCSW-MCS [phone] 06/17/2023  Lockwood Schnur DOB: 1960/12/18 MRN: [id number]   RIDER WAIVER AND RELEASE OF LIABILITY  For the purposes of helping with transportation needs, [facility] partners with outside transportation providers (taxi companies, [employer], Catering manager.) to give [employer] patients or other approved people the choice of on-demand rides [employer]") to our buildings for non-emergency visits.  By using [employer], I, the person signing this document, on behalf of myself and/or any legal minors (in my care using the [employer]), agree:  Science writer given to me are supplied by independent, outside transportation providers who do not work for, or have any affiliation with, [employer]. [place] is not a transportation company. [facility] has no control over the quality or safety of the rides I get using [employer]. [place] has no control over whether any outside ride will happen on time or not. [facility] gives no guarantee on the reliability, quality, safety, or availability on any rides, or that no mistakes will happen. I know and accept that traveling by vehicle (car, truck, SVU, Zenaida Niece, bus, taxi, etc.) has risks of serious injuries such as disability, being paralyzed, and death. I know and agree the risk of using [employer] is mine alone, and not [employer]. Transport Services are provided "as is" and as are available. The transportation providers are in charge for all inspections and care of the vehicles used to provide these rides. I agree not to take legal action against [place], its agents, employees, officers, directors, representatives, insurers, attorneys, assigns, successors, subsidiaries, and affiliates at any time for any reasons related  directly or indirectly to using [employer]. I also agree not to take legal action against [place] or its affiliates for any injury, death, or damage to property caused by or related to using [employer]. I have read this Waiver and Release of Liability, and I understand the terms used in it and their legal meaning. This Waiver is freely and voluntarily given with the understanding that my right (or any legal minors) to legal action against [place] relating to [employer] is knowingly given up to use these services.   I attest that I read the Ride Waiver and Release of Liability to Jeronimo Norma, gave Mr. Kreger the opportunity to ask questions and answered the questions asked (if any). I affirm that Hamilton Capri Pineda then provided consent for assistance with transportation.     Marcy Siren

## 2023-06-17 NOTE — Telephone Encounter (Signed)
Called to confirm/remind patient of their appointment at the [facility] on 06/18/23. However, patient stated he needs transportation to and from his appointment.  Staff message was sent to [facility] for assistance   Patient reminded to bring all medications and/or complete list.

## 2023-06-18 ENCOUNTER — Encounter (HOSPITAL_COMMUNITY): Payer: 59

## 2023-06-18 ENCOUNTER — Telehealth (HOSPITAL_COMMUNITY): Payer: Self-pay | Admitting: Licensed Clinical Social Worker

## 2023-06-18 NOTE — Telephone Encounter (Signed)
H&V Care Navigation CSW Progress Note  Clinical Social Worker received call from taxi company that pt did not come when the taxi arrived despite call and knock at the door.  CSW attempted to call pt to check in but no answer- left VM to inform of missed ride and need to reschedule if he can attend appt.   SDOH Screenings   Food Insecurity: Food Insecurity Present (02/27/2023)  Housing: Medium Risk (02/16/2023)  Transportation Needs: Unmet Transportation Needs (06/17/2023)  Utilities: Not At Risk (02/17/2023)  Alcohol Screen: High Risk (02/20/2022)  Depression (PHQ2-9): Medium Risk (05/05/2019)  Financial Resource Strain: High Risk (02/20/2022)  Tobacco Use: Low Risk  (06/13/2023)     Burna Sis, LCSW Clinical Social Worker [facility] Desk#: [phone] Cell#: [phone]

## 2023-07-04 ENCOUNTER — Encounter (HOSPITAL_COMMUNITY): Payer: 59

## 2023-07-04 ENCOUNTER — Other Ambulatory Visit (HOSPITAL_COMMUNITY): Payer: Self-pay | Admitting: Cardiology

## 2023-07-04 DIAGNOSIS — I5022 Chronic systolic (congestive) heart failure: Secondary | ICD-10-CM

## 2023-07-04 DIAGNOSIS — I739 Peripheral vascular disease, unspecified: Secondary | ICD-10-CM

## 2023-07-11 ENCOUNTER — Telehealth (HOSPITAL_COMMUNITY): Payer: Self-pay | Admitting: Licensed Clinical Social Worker

## 2023-07-11 ENCOUNTER — Telehealth (HOSPITAL_COMMUNITY): Payer: Self-pay | Admitting: Cardiology

## 2023-07-11 ENCOUNTER — Ambulatory Visit (HOSPITAL_COMMUNITY): Payer: 59

## 2023-07-11 NOTE — Telephone Encounter (Signed)
Medication Samples have been provided to the patient.  Drug name: farxiga       Strength: 10        Qty: 28  LOT: [id number]  Exp.Date: 12/2025  Dosing instructions: one tan daily  The patient has been instructed regarding the correct time, dose, and frequency of taking this medication, including desired effects and most common side effects.   Magda Bernheim M 4:04 PM 07/11/2023

## 2023-07-12 ENCOUNTER — Other Ambulatory Visit (HOSPITAL_COMMUNITY): Payer: Self-pay

## 2023-07-14 ENCOUNTER — Telehealth (HOSPITAL_COMMUNITY): Payer: Self-pay | Admitting: Licensed Clinical Social Worker

## 2023-07-14 ENCOUNTER — Other Ambulatory Visit (HOSPITAL_COMMUNITY): Payer: Self-pay

## 2023-07-14 NOTE — Telephone Encounter (Signed)
H&V Care Navigation CSW Progress Note  Clinical Social Worker informed by clinic staff that pt has additional insurance on top of medicaid so it is creating problems with billing medicaid for meds.  Pt cannot afford the almost $8 copay.  CSW able to assist.  Pt will pick up meds tomorrow.   SDOH Screenings   Food Insecurity: Food Insecurity Present (02/27/2023)  Housing: Medium Risk (02/16/2023)  Transportation Needs: Unmet Transportation Needs (06/17/2023)  Utilities: Not At Risk (02/17/2023)  Alcohol Screen: High Risk (02/20/2022)  Depression (PHQ2-9): Medium Risk (05/05/2019)  Financial Resource Strain: High Risk (07/14/2023)  Tobacco Use: Low Risk  (06/13/2023)   Burna Sis, LCSW Clinical Social Worker [facility] Desk#: [phone] Cell#: [phone]
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
## 2023-07-16 ENCOUNTER — Telehealth (HOSPITAL_COMMUNITY): Payer: Self-pay

## 2023-07-16 NOTE — Telephone Encounter (Signed)
Called and spoke to patient's mother to confirm/remind patient of their appointment at the [facility] on 07/17/23.   Patient reminded to bring all medications and/or complete list.  Confirmed patient has transportation. Gave directions, instructed to utilize valet parking.  Confirmed appointment prior to ending call.

## 2023-07-17 ENCOUNTER — Encounter (HOSPITAL_COMMUNITY): Payer: Self-pay

## 2023-07-17 ENCOUNTER — Ambulatory Visit (HOSPITAL_COMMUNITY)
Admission: RE | Admit: 2023-07-17 | Discharge: 2023-07-17 | Disposition: A | Discharge status: Home / Self Care | Payer: MEDICAID | Source: Ambulatory Visit | Attending: Internal Medicine | Admitting: Internal Medicine

## 2023-07-17 VITALS — BP 102/60 | HR 89 | Wt 246.0 lb

## 2023-07-17 DIAGNOSIS — Z7982 Long term (current) use of aspirin: Secondary | ICD-10-CM | POA: Insufficient documentation

## 2023-07-17 DIAGNOSIS — I5023 Acute on chronic systolic (congestive) heart failure: Secondary | ICD-10-CM

## 2023-07-17 DIAGNOSIS — R9431 Abnormal electrocardiogram [ECG] [EKG]: Secondary | ICD-10-CM | POA: Insufficient documentation

## 2023-07-17 DIAGNOSIS — I2510 Atherosclerotic heart disease of native coronary artery without angina pectoris: Secondary | ICD-10-CM

## 2023-07-17 DIAGNOSIS — F1411 Cocaine abuse, in remission: Secondary | ICD-10-CM | POA: Insufficient documentation

## 2023-07-17 DIAGNOSIS — R110 Nausea: Secondary | ICD-10-CM

## 2023-07-17 DIAGNOSIS — Z5901 Sheltered homelessness: Secondary | ICD-10-CM | POA: Diagnosis not present

## 2023-07-17 DIAGNOSIS — N183 Chronic kidney disease, stage 3 unspecified: Secondary | ICD-10-CM | POA: Diagnosis not present

## 2023-07-17 DIAGNOSIS — I5022 Chronic systolic (congestive) heart failure: Secondary | ICD-10-CM | POA: Diagnosis present

## 2023-07-17 DIAGNOSIS — I739 Peripheral vascular disease, unspecified: Secondary | ICD-10-CM

## 2023-07-17 DIAGNOSIS — Z5982 Transportation insecurity: Secondary | ICD-10-CM | POA: Diagnosis not present

## 2023-07-17 DIAGNOSIS — F1010 Alcohol abuse, uncomplicated: Secondary | ICD-10-CM

## 2023-07-17 DIAGNOSIS — I130 Hypertensive heart and chronic kidney disease with heart failure and stage 1 through stage 4 chronic kidney disease, or unspecified chronic kidney disease: Secondary | ICD-10-CM | POA: Diagnosis not present

## 2023-07-17 DIAGNOSIS — N529 Male erectile dysfunction, unspecified: Secondary | ICD-10-CM | POA: Insufficient documentation

## 2023-07-17 DIAGNOSIS — I110 Hypertensive heart disease with heart failure: Secondary | ICD-10-CM | POA: Diagnosis present

## 2023-07-17 DIAGNOSIS — F1410 Cocaine abuse, uncomplicated: Secondary | ICD-10-CM

## 2023-07-17 LAB — BASIC METABOLIC PANEL
Anion gap: 8 (ref 5–15)
BUN: 19 mg/dL (ref 6–20)
CO2: 30 mmol/L (ref 22–32)
Calcium: 8.4 mg/dL — ABNORMAL LOW (ref 8.9–10.3)
Chloride: 103 mmol/L (ref 98–111)
Creatinine, Ser: 1.85 mg/dL — ABNORMAL HIGH (ref 0.61–1.24)
GFR, Estimated: 41 mL/min — ABNORMAL LOW (ref >60)
Glucose, Bld: 70 mg/dL (ref 70–99)
Potassium: 3.9 mmol/L (ref 3.5–5.1)
Sodium: 141 mmol/L (ref 135–145)

## 2023-07-17 LAB — DIGOXIN LEVEL: Digoxin Level: 0.4 ng/mL — ABNORMAL LOW (ref 0.8–2.0)

## 2023-07-17 LAB — BRAIN NATRIURETIC PEPTIDE: B Natriuretic Peptide: 2400.3 pg/mL — ABNORMAL HIGH (ref 0.0–100.0)

## 2023-07-17 NOTE — Progress Notes (Signed)
H&V Care Navigation CSW Progress Note  Clinical Social Worker met with pt to check in and provide bus passes at pt request.  Pt reports he is back living with his friend but is still trying to look into other options.  Did call Coordinated Entry line and plans to go to [facility] to meet with them and complete assessment.  No further needs at this time.   SDOH Screenings   Food Insecurity: Food Insecurity Present (02/27/2023)  Housing: High Risk (07/14/2023)  Transportation Needs: Unmet Transportation Needs (07/17/2023)  Utilities: Not At Risk (02/17/2023)  Alcohol Screen: High Risk (02/20/2022)  Depression (PHQ2-9): Medium Risk (05/05/2019)  Financial Resource Strain: High Risk (07/14/2023)  Tobacco Use: Low Risk  (07/17/2023)    Burna Sis, LCSW Clinical Social Worker [facility] Desk#: [phone] Cell#: [phone]

## 2023-07-17 NOTE — Progress Notes (Signed)
ADVANCED HF CLINIC NOTE  PCP: Dr. Alvis Lemmings HF Cardiologist: Dr [name]   HPI: [name] is a 62 y.o.with a history of HTN, polysubstance abuse, ETOH abuse, mixed NICM/ICM, and chronic HFrEF. Severely reduced EF dating back to 2019.   Has had multiple admit for HF exacerbation frequently associated with medication noncompliance.     Echo 02/2022 - EF 10-15%, global HK, severe LV dilation, severely enlarged RV with severely decreased systolic function, severe biatrial enlargement, moderate MR, dilated IVC    R/LHC (5/23): occluded pRCA w/ collaterals, nonobstructive disease in the left system, markedly elevated filling pressures,  pulmonary venous hypertension, low CO.RA mean 23, RV 50/21, PA 53/32, mean 40, PCWP mean 39, LV 117/35, AO 117/82, CO/CI (Fick): 3.5/1.53, PVR < 1 WU, Oxygen saturations: PA 47%, AO 97%.   Previously followed by HF Paramedicine. Discharged 12/13/22 due to difficulty contacting him, ETOH abuse, and ongoing noncompliance.   Presented to [facility] 01/22/23 with increased dyspnea. Out of HF meds for 3-4 days. Diuresed with IV lasix and sent home later that day.   Readmitted 4/24 with acute on chronic CHF. Still noncompliant with medications. Home HF medications were restarted. Intractable nausea and vomiting also noted and felt to be secondary to GERD. Improvement noted with protonix.  Post hospital follow up 4/24, volume overloaded and ReDs 41%. Torsemide increased and spiro 12.5 started. Planned for close follow up in a week but he cancelled his appt, and no showed the next.  Echo was 7/24 EF < 20%, no LV thrombus, moderately decreased RV systolic function.   Today he returns for AHF follow up. Overall feeling ok, complaining of nausea (chronic). Denies palpitations, CP, dizziness, edema, or PND/Orthopnea. Intermittent SOB with activity. Appetite good, recently ate bad at the folk festival. No fever or chills. Weight at home 244 pounds. Drinking 1 can of beer a week. Smoking  marijuana almost every day. No cigarettes. No cocaine in the last 3 years. Recently started taking his meds back 3 days ago, had been out of meds for almost 10 days with insurance issues.   ECG (personally reviewed): NSR with 1st degree AVB, occasional PVCs 91 bpm QT 501  Labs (9/22): K 3.9, creatinine 1.67 Labs (4/23): K 3.6, creatinine 1.69, hgb 12.8, LDL 87 Labs (5/24): K 4.3, creatinine 1.68 Labs (7/24): K 3.5, SCr 1.74 Labs (8/24): K 3.7, SCr 1.54, LDL 73  PMH: 1. ETOH abuse.  2. Cocaine abuse: Has been off cocaine x months.  3. CAD: Cath in 7/20 with totally occluded RCA with collaterals.  - NSTEMI in 7/22 with HS-TnI > 1000 but no cath.  - LHC (5/23): 40% stenosis mid LAD, CTO proximal RCA.  Medical management.  4. CKD stage 3 5. HTN 6. Hyperlipidemia 7. Chronic systolic CHF: Suspect mixed ischemic/nonischemic cardiomyopathy.  See cath report as above from 7/20.  He continues to drink ETOH heavily, used cocaine in the past.  Both could contribute.  - Echo (4/23): EF 10-15%, global HK, severe LV dilation, severely enlarged RV with severely decreased systolic function, severe biatrial enlargement, moderate MR, dilated IVC - LHC/RHC (5/23): 40% stenosis mid LAD, CTO proximal RCA; mean RA 23, PA 53/32 mean 40, mean PCWP 39, CI 1.53, PVR < 1 WU.  - Echo (7/24): EF < 20%, no LV thrombus, moderately decreased RV systolic function.   ROS: All systems negative except as listed in HPI, PMH and Problem List.  SH:  Social History   Socioeconomic History   Marital status: Single  Spouse name: none   Number of children: Not on file   Years of education: Not on file   Highest education level: Not on file  Occupational History   Occupation: Disabled  Tobacco Use   Smoking status: Never   Smokeless tobacco: Never  Vaping Use   Vaping status: Never Used  Substance and Sexual Activity   Alcohol use: Yes    Comment: 1 quarts a week   Drug use: Not Currently    Frequency: 3.0 times  per week    Types: Marijuana, Cocaine   Sexual activity: Not Currently  Other Topics Concern   Not on file  Social History Narrative   Lives w/ mother in [place] but recently visiting w/ dtr in [place].   Social Determinants of Health   Financial Resource Strain: High Risk (07/14/2023)   Overall Financial Resource Strain (CARDIA)    Difficulty of Paying Living Expenses: Hard  Food Insecurity: Food Insecurity Present (02/27/2023)   Hunger Vital Sign    Worried About Running Out of Food in the Last Year: Sometimes true    Ran Out of Food in the Last Year: Sometimes true  Transportation Needs: Unmet Transportation Needs (06/17/2023)   PRAPARE - Administrator, Civil Service (Medical): Yes    Lack of Transportation (Non-Medical): Yes  Physical Activity: Not on file  Stress: Not on file  Social Connections: Not on file  Intimate Partner Violence: Not At Risk (02/16/2023)   Humiliation, Afraid, Rape, and Kick questionnaire    Fear of Current or Ex-Partner: No    Emotionally Abused: No    Physically Abused: No    Sexually Abused: No    FH:  Family History  Problem Relation Age of Onset   Hypertension Maternal Grandmother     Current Outpatient Medications  Medication Sig Dispense Refill   aspirin EC 81 MG tablet Take 1 tablet (81 mg total) by mouth daily. Swallow whole. 30 tablet 11   atorvastatin (LIPITOR) 40 MG tablet Take 1 tablet (40 mg total) by mouth daily. 90 tablet 3   dapagliflozin propanediol (FARXIGA) 10 MG TABS tablet Take 1 tablet (10 mg total) by mouth daily. 90 tablet 3   digoxin (LANOXIN) 0.125 MG tablet Take 0.5 tablets (0.0625 mg total) by mouth daily. 90 tablet 1   ondansetron (ZOFRAN) 4 MG tablet Take 1 tablet (4 mg total) by mouth every 8 (eight) hours as needed for nausea or vomiting. 18 tablet 1   pantoprazole (PROTONIX) 40 MG tablet Take 1 tablet (40 mg total) by mouth daily. 30 tablet 0   potassium chloride SA (KLOR-CON M) 20 MEQ tablet Take 2 tablets  (40 mEq total) by mouth 2 (two) times daily. 120 tablet 8   sildenafil (VIAGRA) 50 MG tablet Take 1 tablet (50 mg total) by mouth daily as needed for erectile dysfunction. 10 tablet 0   spironolactone (ALDACTONE) 25 MG tablet Take 0.5 tablets (12.5 mg total) by mouth daily. 45 tablet 3   torsemide (DEMADEX) 20 MG tablet Take 20 mg by mouth 2 (two) times daily.     No current facility-administered medications for this encounter.   BP 102/60   Pulse 89   Wt 111.6 kg (246 lb)   SpO2 97%   BMI 34.31 kg/m   Wt Readings from Last 3 Encounters:  07/17/23 111.6 kg (246 lb)  06/13/23 113.4 kg (250 lb)  06/04/23 111.6 kg (246 lb)   PHYSICAL EXAM: General:  well appearing.  No respiratory  difficulty. Walked into clinic HEENT: normal Neck: supple. JVD ~14 cm. Carotids 2+ bilat; no bruits. No lymphadenopathy or thyromegaly appreciated. Cor: PMI nondisplaced. Regular rate & rhythm. No rubs, gallops or murmurs. Lungs: clear, diminished bases Abdomen: soft, nontender, nondistended. No hepatosplenomegaly. No bruits or masses. Good bowel sounds. Extremities: no cyanosis, clubbing, rash, +2-3 BLE edema  Neuro: alert & oriented x 3, cranial nerves grossly intact. moves all 4 extremities w/o difficulty. Affect pleasant.   ASSESSMENT & PLAN: 1. 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 He has a history of alcohol and cocaine abuse, which likely contributes to cardiomyopathy.  LHC in 7/20 and again in 5/23 showed RCA chronic occlusion. RHC in 5/23 showed low output with CI 1.53 and markedly elevated filling pressures.  Most recent echo done today showed EF < 20%, no LV thrombus, moderately decreased RV systolic function. NYHA III, volume overloaded on exam. Ran out of meds for almost 10 days, recently restarted them all - Continue digoxin at lower dose, 0.0625 mg bid (high level on 0.125 mg). Digoxin level in 1 week.  - Has been taking torsemide 40 mg daily + KDUR 20 mEq.  Increase torsemide to 40 mg BID and increase KDUR to daily - Continue spironolactone 12.5 mg daily.  - Continue Farxiga 10 mg daily. - Off Entresto due to hypotension and AKI.  - Off BiDil with low BP. - Not candidate for ICD unless he can cut back on ETOH abuse.  He has stopped cocaine. Narrow QRS so no CRT.  - Not an advanced therapies candidate with substance abuse, poor compliance, and living situation (basically homeless, lives with friends or in hotels). Would also be poor home milrinone candidate.  - BMET/BNP today, repeat BMET 7-10 days.  2. CAD: LHC in 7/20 and 5/23 showed CTO of RCA treated medically, left system ok. Suspect cardiomyopathy is primarily nonischemic. No chest pain. - Continue ASA 81 daily + atorvastatin 40 mg daily.  3. Cocaine Use Disorder: Has been off cocaine for > 1 year now.  4. Alcohol Use Disorder: Still drinking, encouraged to continue to cut back even more 5. CKD stage 3: BMET today, continue [place].  6. Nausea: Chronic.  Suspect combination of HF, ETOH and heavy THC use.  - Continue PPI.  - May need referral to GI, see PCP. 7. ?PAD: Significant fatigue in calves with walking and difficult to palpate pulses. Resolved 8. Erectile dysfunction: Has Viagra 50 mg prn, warned about risk for hypotension and need to avoid NTG.   Follow up in 2 wks with APP to reassess volume (needs BMET)  Alen Bleacher AGACNP-BC  07/17/2023

## 2023-07-17 NOTE — Patient Instructions (Signed)
Medication Changes:  Increase Torsemide to 40 mg twice a day Increase potassium to 60 Meq once a day     Lab Work:  Labs today We will only contact you if something comes back abnormal or we need to make some changes. Otherwise no news is good news!  Testing/Procedures:  None  Referrals:  None  Special Instructions // Education:  None  Follow-Up in: In 2 weeks in the App clinic  At the [facility], you and your health needs are our priority. We have a designated team specialized in the treatment of Heart Failure. This Care Team includes your primary Heart Failure Specialized Cardiologist (physician), Advanced Practice Providers (APPs- Physician Assistants and Nurse Practitioners), and Pharmacist who all work together to provide you with the care you need, when you need it.   You may see any of the following providers on your designated Care Team at your next follow up:  Dr. Arvilla Meres Dr. Marca Ancona Dr. Marcos Eke, NP Robbie Lis, [place] [facility] [place], [place] Brynda Peon, NP Karle Plumber, PharmD   Please be sure to bring in all your medications bottles to every appointment.   Need to Contact us:  If you have any questions or concerns before your next appointment please send [place] a message through [employer] or call our office at [phone].    TO LEAVE A MESSAGE FOR THE NURSE SELECT OPTION 2, PLEASE LEAVE A MESSAGE INCLUDING: YOUR NAME DATE OF BIRTH CALL BACK NUMBER REASON FOR CALL**this is important as we prioritize the call backs  YOU WILL RECEIVE A CALL BACK THE SAME DAY AS LONG AS YOU CALL BEFORE 4:00 PM

## 2023-07-21 ENCOUNTER — Telehealth (HOSPITAL_COMMUNITY): Payer: Self-pay | Admitting: Licensed Clinical Social Worker

## 2023-07-21 NOTE — Telephone Encounter (Signed)
H&V Care Navigation CSW Progress Note  Patient showed up without appointment requesting to see CSW.  Requesting help with food and with bus passes.  Able to provide some bus passes and Heart and vascular food bag.  SDOH Screenings   Food Insecurity: Food Insecurity Present (07/21/2023)  Housing: High Risk (07/14/2023)  Transportation Needs: Unmet Transportation Needs (07/21/2023)  Utilities: Not At Risk (02/17/2023)  Alcohol Screen: High Risk (02/20/2022)  Depression (PHQ2-9): Medium Risk (05/05/2019)  Financial Resource Strain: High Risk (07/14/2023)  Tobacco Use: Low Risk  (07/17/2023)    Burna Sis, LCSW Clinical Social Worker [facility] Desk#: [phone] Cell#: [phone]

## 2023-07-24 ENCOUNTER — Other Ambulatory Visit (HOSPITAL_COMMUNITY): Payer: 59

## 2023-07-25 ENCOUNTER — Other Ambulatory Visit (HOSPITAL_COMMUNITY): Payer: 59

## 2023-07-25 ENCOUNTER — Ambulatory Visit (HOSPITAL_COMMUNITY)
Admission: RE | Admit: 2023-07-25 | Discharge: 2023-07-25 | Disposition: A | Discharge status: Home / Self Care | Payer: MEDICAID | Source: Ambulatory Visit | Attending: Cardiovascular Disease | Admitting: Cardiovascular Disease

## 2023-07-25 DIAGNOSIS — I739 Peripheral vascular disease, unspecified: Secondary | ICD-10-CM | POA: Insufficient documentation

## 2023-07-25 LAB — VAS US ABI WITH/WO TBI
Left ABI: 1.13
Right ABI: 1.05

## 2023-07-28 ENCOUNTER — Other Ambulatory Visit (HOSPITAL_COMMUNITY): Payer: 59

## 2023-07-30 ENCOUNTER — Telehealth (HOSPITAL_COMMUNITY): Payer: Self-pay

## 2023-07-30 NOTE — Telephone Encounter (Signed)
Called and was unable to leave a voice mail to confirm/remind patient of their appointment at the [facility] on 07/31/23.

## 2023-07-31 ENCOUNTER — Encounter (HOSPITAL_COMMUNITY): Payer: 59

## 2023-08-20 ENCOUNTER — Telehealth (HOSPITAL_COMMUNITY): Payer: Self-pay | Admitting: Licensed Clinical Social Worker
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
## 2023-08-25 ENCOUNTER — Telehealth (HOSPITAL_COMMUNITY): Payer: Self-pay | Admitting: Licensed Clinical Social Worker
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
## 2023-09-02 ENCOUNTER — Telehealth (HOSPITAL_COMMUNITY): Payer: Self-pay

## 2023-09-02 NOTE — Telephone Encounter (Signed)
Called and was unable to leave a voice message to confirm/remind patient of their appointment at the [facility] on 09/03/23.

## 2023-09-02 NOTE — Progress Notes (Signed)
ADVANCED HF CLINIC NOTE  PCP: Dr. Alvis Lemmings HF Cardiologist: Dr [name]   HPI: Dale [name] is a 62 y.o.with a history of HTN, polysubstance abuse, ETOH abuse, mixed NICM/ICM, and chronic HFrEF. Severely reduced EF dating back to 2019.   Has had multiple admit for HF exacerbation frequently associated with medication noncompliance.     Echo 02/2022 - EF 10-15%, global HK, severe LV dilation, severely enlarged RV with severely decreased systolic function, severe biatrial enlargement, moderate Dale, dilated IVC    R/LHC (5/23): occluded pRCA w/ collaterals, nonobstructive disease in the left system, markedly elevated filling pressures,  pulmonary venous hypertension, low CO.RA mean 23, RV 50/21, PA 53/32, mean 40, PCWP mean 39, LV 117/35, AO 117/82, CO/CI (Fick): 3.5/1.53, PVR < 1 WU, Oxygen saturations: PA 47%, AO 97%.   Previously followed by HF Paramedicine. Discharged 12/13/22 due to difficulty contacting him, ETOH abuse, and ongoing noncompliance.   Presented to [facility] 01/22/23 with increased dyspnea. Out of HF meds for 3-4 days. Diuresed with IV lasix and sent home later that day.   Readmitted 4/24 with acute on chronic CHF. Still noncompliant with medications. Home HF medications were restarted. Intractable nausea and vomiting also noted and felt to be secondary to GERD. Improvement noted with protonix.  Post hospital follow up 4/24, volume overloaded and ReDs 41%. Torsemide increased and spiro 12.5 started. Planned for close follow up in a week but he cancelled his appt, and no showed the next.  Echo was 7/24 EF < 20%, no LV thrombus, moderately decreased RV systolic function.   Today he returns for AHF follow up. Overall feeling ok, complaining of nausea (chronic). Denies palpitations, CP, dizziness, edema, or PND/Orthopnea. Intermittent SOB with activity. Appetite good, recently ate bad at the folk festival. No fever or chills. Weight at home 244 pounds. Drinking 1 can of beer a week. Smoking  marijuana almost every day. No cigarettes. No cocaine in the last 3 years. Recently started taking his meds back 3 days ago, had been out of meds for almost 10 days with insurance issues.   ECG (personally reviewed): NSR with 1st degree AVB, occasional PVCs 91 bpm QT 501  Labs (9/22): K 3.9, creatinine 1.67 Labs (4/23): K 3.6, creatinine 1.69, hgb 12.8, LDL 87 Labs (5/24): K 4.3, creatinine 1.68 Labs (7/24): K 3.5, SCr 1.74 Labs (8/24): K 3.7, SCr 1.54, LDL 73  PMH: 1. ETOH abuse.  2. Cocaine abuse: Has been off cocaine x months.  3. CAD: Cath in 7/20 with totally occluded RCA with collaterals.  - NSTEMI in 7/22 with HS-TnI > 1000 but no cath.  - LHC (5/23): 40% stenosis mid LAD, CTO proximal RCA.  Medical management.  4. CKD stage 3 5. HTN 6. Hyperlipidemia 7. Chronic systolic CHF: Suspect mixed ischemic/nonischemic cardiomyopathy.  See cath report as above from 7/20.  He continues to drink ETOH heavily, used cocaine in the past.  Both could contribute.  - Echo (4/23): EF 10-15%, global HK, severe LV dilation, severely enlarged RV with severely decreased systolic function, severe biatrial enlargement, moderate Dale, dilated IVC - LHC/RHC (5/23): 40% stenosis mid LAD, CTO proximal RCA; mean RA 23, PA 53/32 mean 40, mean PCWP 39, CI 1.53, PVR < 1 WU.  - Echo (7/24): EF < 20%, no LV thrombus, moderately decreased RV systolic function.   ROS: All systems negative except as listed in HPI, PMH and Problem List.  SH:  Social History   Socioeconomic History   Marital status: Single  Spouse name: none   Number of children: Not on file   Years of education: Not on file   Highest education level: Not on file  Occupational History   Occupation: Disabled  Tobacco Use   Smoking status: Never   Smokeless tobacco: Never  Vaping Use   Vaping status: Never Used  Substance and Sexual Activity   Alcohol use: Yes    Comment: 1 quarts a week   Drug use: Not Currently    Frequency: 3.0 times  per week    Types: Marijuana, Cocaine   Sexual activity: Not Currently  Other Topics Concern   Not on file  Social History Narrative   Lives w/ mother in [place] but recently visiting w/ dtr in [place].   Social Determinants of Health   Financial Resource Strain: High Risk (08/25/2023)   Overall Financial Resource Strain (CARDIA)    Difficulty of Paying Living Expenses: Hard  Food Insecurity: Food Insecurity Present (07/21/2023)   Hunger Vital Sign    Worried About Programme researcher, broadcasting/film/video in the Last Year: Often true    Ran Out of Food in the Last Year: Often true  Transportation Needs: Unmet Transportation Needs (07/21/2023)   PRAPARE - Administrator, Civil Service (Medical): Yes    Lack of Transportation (Non-Medical): Yes  Physical Activity: Not on file  Stress: Not on file  Social Connections: Not on file  Intimate Partner Violence: Not At Risk (02/16/2023)   Humiliation, Afraid, Rape, and Kick questionnaire    Fear of Current or Ex-Partner: No    Emotionally Abused: No    Physically Abused: No    Sexually Abused: No    FH:  Family History  Problem Relation Age of Onset   Hypertension Maternal Grandmother     Current Outpatient Medications  Medication Sig Dispense Refill   aspirin EC 81 MG tablet Take 1 tablet (81 mg total) by mouth daily. Swallow whole. 30 tablet 11   atorvastatin (LIPITOR) 40 MG tablet Take 1 tablet (40 mg total) by mouth daily. 90 tablet 3   dapagliflozin propanediol (FARXIGA) 10 MG TABS tablet Take 1 tablet (10 mg total) by mouth daily. 90 tablet 3   digoxin (LANOXIN) 0.125 MG tablet Take 0.5 tablets (0.0625 mg total) by mouth daily. 90 tablet 1   ondansetron (ZOFRAN) 4 MG tablet Take 1 tablet (4 mg total) by mouth every 8 (eight) hours as needed for nausea or vomiting. 18 tablet 1   pantoprazole (PROTONIX) 40 MG tablet Take 1 tablet (40 mg total) by mouth daily. 30 tablet 0   potassium chloride SA (KLOR-CON M) 20 MEQ tablet Take 2 tablets (40  mEq total) by mouth 2 (two) times daily. 120 tablet 8   sildenafil (VIAGRA) 50 MG tablet Take 1 tablet (50 mg total) by mouth daily as needed for erectile dysfunction. 10 tablet 0   spironolactone (ALDACTONE) 25 MG tablet Take 0.5 tablets (12.5 mg total) by mouth daily. 45 tablet 3   torsemide (DEMADEX) 20 MG tablet Take 20 mg by mouth 2 (two) times daily.     No current facility-administered medications for this visit.   There were no vitals taken for this visit.  Wt Readings from Last 3 Encounters:  07/17/23 111.6 kg (246 lb)  06/13/23 113.4 kg (250 lb)  06/04/23 111.6 kg (246 lb)   PHYSICAL EXAM: General:  well appearing.  No respiratory difficulty. Walked into clinic HEENT: normal Neck: supple. JVD ~14 cm. Carotids 2+ bilat; no  bruits. No lymphadenopathy or thyromegaly appreciated. Cor: PMI nondisplaced. Regular rate & rhythm. No rubs, gallops or murmurs. Lungs: clear, diminished bases Abdomen: soft, nontender, nondistended. No hepatosplenomegaly. No bruits or masses. Good bowel sounds. Extremities: no cyanosis, clubbing, rash, +2-3 BLE edema  Neuro: alert & oriented x 3, cranial nerves grossly intact. moves all 4 extremities w/o difficulty. Affect pleasant.   ASSESSMENT & PLAN: 1. 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 He has a history of alcohol and cocaine abuse, which likely contributes to cardiomyopathy.  LHC in 7/20 and again in 5/23 showed RCA chronic occlusion. RHC in 5/23 showed low output with CI 1.53 and markedly elevated filling pressures.  Most recent echo done today showed EF < 20%, no LV thrombus, moderately decreased RV systolic function. NYHA III, volume overloaded on exam. Ran out of meds for almost 10 days, recently restarted them all - Continue digoxin at lower dose, 0.0625 mg bid (high level on 0.125 mg). Digoxin level in 1 week.  - Has been taking torsemide 40 mg daily + KDUR 20 mEq. Increase torsemide to 40 mg BID and increase  KDUR to daily - Continue spironolactone 12.5 mg daily.  - Continue Farxiga 10 mg daily. - Off Entresto due to hypotension and AKI.  - Off BiDil with low BP. - Not candidate for ICD unless he can cut back on ETOH abuse.  He has stopped cocaine. Narrow QRS so no CRT.  - Not an advanced therapies candidate with substance abuse, poor compliance, and living situation (basically homeless, lives with friends or in hotels). Would also be poor home milrinone candidate.  - BMET/BNP today, repeat BMET 7-10 days.  2. CAD: LHC in 7/20 and 5/23 showed CTO of RCA treated medically, left system ok. Suspect cardiomyopathy is primarily nonischemic. No chest pain. - Continue ASA 81 daily + atorvastatin 40 mg daily.  3. Cocaine Use Disorder: Has been off cocaine for > 1 year now.  4. Alcohol Use Disorder: Still drinking, encouraged to continue to cut back even more 5. CKD stage 3: BMET today, continue [place].  6. Nausea: Chronic.  Suspect combination of HF, ETOH and heavy THC use.  - Continue PPI.  - May need referral to GI, see PCP. 7. ?PAD: Significant fatigue in calves with walking and difficult to palpate pulses. Resolved 8. Erectile dysfunction: Has Viagra 50 mg prn, warned about risk for hypotension and need to avoid NTG.   Follow up in 2 wks with APP to reassess volume (needs BMET)  [facility] AGACNP-BC  09/02/2023

## 2023-09-03 ENCOUNTER — Encounter (HOSPITAL_COMMUNITY): Payer: Self-pay

## 2023-09-03 ENCOUNTER — Ambulatory Visit (HOSPITAL_COMMUNITY)
Admission: RE | Admit: 2023-09-03 | Discharge: 2023-09-03 | Disposition: A | Discharge status: Home / Self Care | Payer: MEDICAID | Source: Ambulatory Visit | Attending: Family Medicine

## 2023-09-03 VITALS — BP 102/78 | HR 93 | Wt 242.0 lb

## 2023-09-03 DIAGNOSIS — I428 Other cardiomyopathies: Secondary | ICD-10-CM | POA: Diagnosis not present

## 2023-09-03 DIAGNOSIS — I130 Hypertensive heart and chronic kidney disease with heart failure and stage 1 through stage 4 chronic kidney disease, or unspecified chronic kidney disease: Secondary | ICD-10-CM | POA: Diagnosis not present

## 2023-09-03 DIAGNOSIS — F1410 Cocaine abuse, uncomplicated: Secondary | ICD-10-CM | POA: Diagnosis not present

## 2023-09-03 DIAGNOSIS — F1010 Alcohol abuse, uncomplicated: Secondary | ICD-10-CM | POA: Insufficient documentation

## 2023-09-03 DIAGNOSIS — I2510 Atherosclerotic heart disease of native coronary artery without angina pectoris: Secondary | ICD-10-CM | POA: Diagnosis not present

## 2023-09-03 DIAGNOSIS — N183 Chronic kidney disease, stage 3 unspecified: Secondary | ICD-10-CM | POA: Insufficient documentation

## 2023-09-03 DIAGNOSIS — F1411 Cocaine abuse, in remission: Secondary | ICD-10-CM | POA: Diagnosis not present

## 2023-09-03 DIAGNOSIS — I5022 Chronic systolic (congestive) heart failure: Secondary | ICD-10-CM | POA: Diagnosis present

## 2023-09-03 DIAGNOSIS — Z5987 Material hardship due to limited financial resources, not elsewhere classified: Secondary | ICD-10-CM | POA: Diagnosis not present

## 2023-09-03 DIAGNOSIS — Z7982 Long term (current) use of aspirin: Secondary | ICD-10-CM | POA: Insufficient documentation

## 2023-09-03 DIAGNOSIS — Z5982 Transportation insecurity: Secondary | ICD-10-CM | POA: Insufficient documentation

## 2023-09-03 DIAGNOSIS — I255 Ischemic cardiomyopathy: Secondary | ICD-10-CM | POA: Insufficient documentation

## 2023-09-03 LAB — BASIC METABOLIC PANEL
Anion gap: 9 (ref 5–15)
BUN: 20 mg/dL (ref 6–20)
CO2: 27 mmol/L (ref 22–32)
Calcium: 8.8 mg/dL — ABNORMAL LOW (ref 8.9–10.3)
Chloride: 103 mmol/L (ref 98–111)
Creatinine, Ser: 1.56 mg/dL — ABNORMAL HIGH (ref 0.61–1.24)
GFR, Estimated: 51 mL/min — ABNORMAL LOW (ref >60)
Glucose, Bld: 107 mg/dL — ABNORMAL HIGH (ref 70–99)
Potassium: 4.2 mmol/L (ref 3.5–5.1)
Sodium: 139 mmol/L (ref 135–145)

## 2023-09-03 LAB — BRAIN NATRIURETIC PEPTIDE: B Natriuretic Peptide: 2348.4 pg/mL — ABNORMAL HIGH (ref 0.0–100.0)

## 2023-09-03 MED ORDER — METOLAZONE 2.5 MG PO TABS: 2.5000 mg | ORAL_TABLET | Freq: Every day | ORAL | 0 refills | Status: DC

## 2023-09-03 MED ORDER — TORSEMIDE 20 MG PO TABS: 20.0000 mg | ORAL_TABLET | Freq: Every day | ORAL | 6 refills | Status: DC

## 2023-09-03 NOTE — Patient Instructions (Addendum)
Thank you for coming in today  If you had labs drawn today, any labs that are abnormal the clinic will call you No news is good news  Medications: Increase Torsemide to 80 mg 4 tablets, twice a day  Take Metolazone 2.5 mg 1 tablet with extra 40 meq of Potassium today only 09/03/2023  Follow up appointments:  Your physician recommends that you schedule a follow-up appointment in:  3-4 weeks in clinic   Do the following things EVERYDAY: Weigh yourself in the morning before breakfast. Write it down and keep it in a log. Take your medicines as prescribed Eat low salt foods--Limit salt (sodium) to 2000 mg per day.  Stay as active as you can everyday Limit all fluids for the day to less than 2 liters/64 ounces daily   At the [facility], you and your health needs are our priority. As part of our continuing mission to provide you with exceptional heart care, we have created designated Provider Care Teams. These Care Teams include your primary Cardiologist (physician) and Advanced Practice Providers (APPs- Physician Assistants and Nurse Practitioners) who all work together to provide you with the care you need, when you need it.   You may see any of the following providers on your designated Care Team at your next follow up: Dr Arvilla Meres Dr Marca Ancona Dr. Marcos Eke, NP Robbie Lis, [place] [facility] [place], [place] Brynda Peon, NP Karle Plumber, PharmD   Please be sure to bring in all your medications bottles to every appointment.    Thank you for choosing [facility]  If you have any questions or concerns before your next appointment please send [place] a message through [place] or call our office at [phone].    TO LEAVE A MESSAGE FOR THE NURSE SELECT OPTION 2, PLEASE LEAVE A MESSAGE INCLUDING: YOUR NAME DATE OF BIRTH CALL BACK NUMBER REASON FOR CALL**this is important as we  prioritize the call backs  YOU WILL RECEIVE A CALL BACK THE SAME DAY AS LONG AS YOU CALL BEFORE 4:00 PM

## 2023-09-03 NOTE — Progress Notes (Signed)
H&V Care Navigation CSW Progress Note  Clinical Social Worker met with pt and provided with winter jacket and jeans as he reported almost all his clothes had been thrown out.  Patient very appreciative.  Also reports need for bus passes- provided 3.   SDOH Screenings   Food Insecurity: Food Insecurity Present (07/21/2023)  Housing: High Risk (07/14/2023)  Transportation Needs: Unmet Transportation Needs (09/03/2023)  Utilities: Not At Risk (02/17/2023)  Alcohol Screen: High Risk (02/20/2022)  Depression (PHQ2-9): Medium Risk (05/05/2019)  Financial Resource Strain: High Risk (08/25/2023)  Tobacco Use: Low Risk  (09/03/2023)   Burna Sis, LCSW Clinical Social Worker [facility] Desk#: [phone] Cell#: [phone]

## 2023-09-11 ENCOUNTER — Other Ambulatory Visit (HOSPITAL_COMMUNITY): Payer: Self-pay

## 2023-09-15 ENCOUNTER — Ambulatory Visit (HOSPITAL_COMMUNITY)
Admission: RE | Admit: 2023-09-15 | Discharge: 2023-09-15 | Disposition: A | Discharge status: Home / Self Care | Payer: MEDICAID | Source: Ambulatory Visit | Attending: Cardiology | Admitting: Cardiology

## 2023-09-15 DIAGNOSIS — I5022 Chronic systolic (congestive) heart failure: Secondary | ICD-10-CM | POA: Diagnosis present

## 2023-09-15 LAB — BASIC METABOLIC PANEL
Anion gap: 8 (ref 5–15)
BUN: 22 mg/dL — ABNORMAL HIGH (ref 6–20)
CO2: 26 mmol/L (ref 22–32)
Calcium: 8.6 mg/dL — ABNORMAL LOW (ref 8.9–10.3)
Chloride: 104 mmol/L (ref 98–111)
Creatinine, Ser: 1.46 mg/dL — ABNORMAL HIGH (ref 0.61–1.24)
GFR, Estimated: 55 mL/min — ABNORMAL LOW (ref >60)
Glucose, Bld: 93 mg/dL (ref 70–99)
Potassium: 4.1 mmol/L (ref 3.5–5.1)
Sodium: 138 mmol/L (ref 135–145)

## 2023-09-29 ENCOUNTER — Telehealth (HOSPITAL_COMMUNITY): Payer: Self-pay

## 2023-09-29 NOTE — Telephone Encounter (Signed)
Called both numbers in patient's chart and was unable to leave a voice message to confirm/remind patient of their appointment at the [facility] on 09/30/23.

## 2023-09-30 ENCOUNTER — Encounter (HOSPITAL_COMMUNITY): Payer: MEDICAID

## 2023-10-10 ENCOUNTER — Emergency Department (HOSPITAL_COMMUNITY)
Admission: EM | Admit: 2023-10-10 | Discharge: 2023-10-10 | Disposition: A | Discharge status: Home / Self Care | Payer: MEDICAID | Attending: Emergency Medicine | Admitting: Emergency Medicine

## 2023-10-10 ENCOUNTER — Emergency Department (HOSPITAL_COMMUNITY): Payer: MEDICAID

## 2023-10-10 ENCOUNTER — Other Ambulatory Visit: Payer: Self-pay

## 2023-10-10 DIAGNOSIS — R0602 Shortness of breath: Secondary | ICD-10-CM | POA: Diagnosis present

## 2023-10-10 DIAGNOSIS — Z91148 Patient's other noncompliance with medication regimen for other reason: Secondary | ICD-10-CM | POA: Insufficient documentation

## 2023-10-10 DIAGNOSIS — I5043 Acute on chronic combined systolic (congestive) and diastolic (congestive) heart failure: Secondary | ICD-10-CM | POA: Insufficient documentation

## 2023-10-10 DIAGNOSIS — N179 Acute kidney failure, unspecified: Secondary | ICD-10-CM | POA: Insufficient documentation

## 2023-10-10 DIAGNOSIS — I110 Hypertensive heart disease with heart failure: Secondary | ICD-10-CM | POA: Insufficient documentation

## 2023-10-10 DIAGNOSIS — Z7982 Long term (current) use of aspirin: Secondary | ICD-10-CM | POA: Insufficient documentation

## 2023-10-10 LAB — BASIC METABOLIC PANEL
Anion gap: 11 (ref 5–15)
BUN: 41 mg/dL — ABNORMAL HIGH (ref 6–20)
CO2: 25 mmol/L (ref 22–32)
Calcium: 9.3 mg/dL (ref 8.9–10.3)
Chloride: 101 mmol/L (ref 98–111)
Creatinine, Ser: 2.09 mg/dL — ABNORMAL HIGH (ref 0.61–1.24)
GFR, Estimated: 36 mL/min — ABNORMAL LOW (ref >60)
Glucose, Bld: 101 mg/dL — ABNORMAL HIGH (ref 70–99)
Potassium: 3.6 mmol/L (ref 3.5–5.1)
Sodium: 137 mmol/L (ref 135–145)

## 2023-10-10 LAB — CBC WITH DIFFERENTIAL/PLATELET
Abs Immature Granulocytes: 0.03 10*3/uL (ref 0.00–0.07)
Basophils Absolute: 0.1 10*3/uL (ref 0.0–0.1)
Basophils Relative: 1 %
Eosinophils Absolute: 0.1 10*3/uL (ref 0.0–0.5)
Eosinophils Relative: 2 %
HCT: 41.7 % (ref 39.0–52.0)
Hemoglobin: 13.6 g/dL (ref 13.0–17.0)
Immature Granulocytes: 1 %
Lymphocytes Relative: 18 %
Lymphs Abs: 1.1 10*3/uL (ref 0.7–4.0)
MCH: 30.5 pg (ref 26.0–34.0)
MCHC: 32.6 g/dL (ref 30.0–36.0)
MCV: 93.5 fL (ref 80.0–100.0)
Monocytes Absolute: 0.5 10*3/uL (ref 0.1–1.0)
Monocytes Relative: 9 %
Neutro Abs: 4.3 10*3/uL (ref 1.7–7.7)
Neutrophils Relative %: 69 %
Platelets: 264 10*3/uL (ref 150–400)
RBC: 4.46 MIL/uL (ref 4.22–5.81)
RDW: 15.2 % (ref 11.5–15.5)
WBC: 6.1 10*3/uL (ref 4.0–10.5)
nRBC: 0 % (ref 0.0–0.2)

## 2023-10-10 LAB — BRAIN NATRIURETIC PEPTIDE: B Natriuretic Peptide: 3459.8 pg/mL — ABNORMAL HIGH (ref 0.0–100.0)

## 2023-10-10 LAB — DIGOXIN LEVEL: Digoxin Level: 0.3 ng/mL — ABNORMAL LOW (ref 0.8–2.0)

## 2023-10-10 MED ORDER — FUROSEMIDE 10 MG/ML IJ SOLN
60.0000 mg | Freq: Once | INTRAMUSCULAR | Status: AC
  Administered 2023-10-10: 60 mg via INTRAVENOUS
  Filled 2023-10-10: qty 6

## 2023-10-10 MED ORDER — FAMOTIDINE IN NACL 20-0.9 MG/50ML-% IV SOLN
20.0000 mg | Freq: Once | INTRAVENOUS | Status: AC
  Administered 2023-10-10: 20 mg via INTRAVENOUS
  Filled 2023-10-10: qty 50

## 2023-10-10 MED ORDER — ONDANSETRON HCL 4 MG/2ML IJ SOLN
4.0000 mg | Freq: Once | INTRAMUSCULAR | Status: AC
  Administered 2023-10-10: 4 mg via INTRAVENOUS
  Filled 2023-10-10: qty 2

## 2023-10-10 NOTE — ED Notes (Signed)
Pt upset about being dc. Pt refusing DC VS at this time. RN gave pt bus pass.

## 2023-10-10 NOTE — Discharge Instructions (Signed)
Please follow-up with the advanced heart failure clinic in regards to recent symptoms and ER visit.  Today you were treated for a CHF exacerbation and cardiology recommends increasing the torsemide to 40 mg in the AM and 20 mg in the afternoon for three days, then returning to your normal torsemide dose.  If symptoms change or worsen please return to the ER.

## 2023-10-10 NOTE — ED Notes (Addendum)
Pt oxygen stayed at 90% on room air while ambulated

## 2023-10-10 NOTE — ED Provider Notes (Addendum)
Patient given in sign out by [facility], PA-C.  Please review their note for patient HPI, physical exam, workup.  At this time the plan is to speak with cardiology for med rec.  Consults: Jodelle Red, MD Cardiology  I spoke to cardiology who recommends increasing the torsemide to 40 mg in the AM and 20 mg in the afternoon for three days, then returning to his normal torsemide dose.  Cardiology states that patient does not require admission at this time.  Patient is to follow-up with the advanced heart failure clinic as per the cardiologist plan and is safe to be discharged at this time.  Patient stable for discharge.  Patient given return precautions.  I spoke to the patient about this plan however patient disagrees and refuses to leave until he speaks with another provider.  Attending was notified.  Attending evaluated patient and agreed with discharge.  Patient discharged in stable condition.  Patient does have furosemide prescription and refills already at his pharmacy and does not need a refill at this time.   Netta Corrigan, PA-C 10/10/23 0902    Jacalyn Lefevre, MD 10/10/23 [phone]

## 2023-10-10 NOTE — ED Triage Notes (Signed)
Pt BIB [facility] from home. SOB gets worse when laying flat or moving around. Pt does have +3 pitting edema in bilateral lower extremities; and states he is complaint with all CHF medications.   96% on room air, 130/70, HR 112

## 2023-10-10 NOTE — ED Provider Notes (Signed)
[place] EMERGENCY DEPARTMENT AT [facility] Provider Note   CSN: [id number] Arrival date & time: 10/10/23  0206     History  Chief Complaint  Patient presents with   Shortness of Breath    Dale [name] is a 62 y.o. male.  62 y/o male with hx of HTN, polysubstance abuse, ETOH abuse, mixed NICM/ICM, and chronic HFrEF (LVEF <20% in July 2024) presents to the ED for c/o SOB. Has been experiencing worsening DOE and orthopnea over the past few days. Symptoms associated with BLE edema; has somewhat improved with his diuretic.  No fevers, chest pain, LOC, syncope.  Unsure of weight gain.  Per chart review and prior cardiology notes, severely reduced EF dating back to 2019.   Has had multiple admit for HF exacerbation frequently associated with medication noncompliance.  Patient denies any noncompliance with meds recently.  Last use of cocaine was 2.5 years ago.  Has been homeless since 2013; currently [address].       Home Medications Prior to Admission medications   Medication Sig Start Date End Date Taking? Authorizing Provider  aspirin EC 81 MG tablet Take 1 tablet (81 mg total) by mouth daily. Swallow whole. 06/04/23   Laurey Morale, MD  atorvastatin (LIPITOR) 40 MG tablet Take 1 tablet (40 mg total) by mouth daily. 06/04/23   Laurey Morale, MD  dapagliflozin propanediol (FARXIGA) 10 MG TABS tablet Take 1 tablet (10 mg total) by mouth daily. 06/04/23   Laurey Morale, MD  digoxin (LANOXIN) 0.125 MG tablet Take 0.5 tablets (0.0625 mg total) by mouth daily. 06/04/23   Laurey Morale, MD  famotidine (PEPCID) 20 MG tablet Take 20 mg by mouth 2 (two) times daily. 08/13/23   [provider]  metolazone (ZAROXOLYN) 2.5 MG tablet Take 1 tablet (2.5 mg total) by mouth daily for 1 day. 09/03/23 09/04/23  Jacklynn Ganong, FNP  ondansetron (ZOFRAN) 4 MG tablet Take 1 tablet (4 mg total) by mouth every 8 (eight) hours as needed for nausea or vomiting.  04/03/23 04/02/24  Jacklynn Ganong, FNP  ondansetron (ZOFRAN-ODT) 4 MG disintegrating tablet Take 4 mg by mouth every 8 (eight) hours as needed. 08/13/23   [provider]  pantoprazole (PROTONIX) 40 MG tablet Take 1 tablet (40 mg total) by mouth daily. 02/19/23   Mapp, Gaylyn Cheers, MD  potassium chloride SA (KLOR-CON M) 20 MEQ tablet Take 2 tablets (40 mEq total) by mouth 2 (two) times daily. 06/04/23   Laurey Morale, MD  sildenafil (VIAGRA) 50 MG tablet Take 1 tablet (50 mg total) by mouth daily as needed for erectile dysfunction. 06/04/23   Laurey Morale, MD  spironolactone (ALDACTONE) 25 MG tablet Take 0.5 tablets (12.5 mg total) by mouth daily. 06/04/23 05/29/24  Laurey Morale, MD  torsemide (DEMADEX) 20 MG tablet Take 1 tablet (20 mg total) by mouth daily. 09/03/23   Jacklynn Ganong, FNP      Allergies    Hydrocodone    Review of Systems   Review of Systems Ten systems reviewed and are negative for acute change, except as noted in the HPI.    Physical Exam Updated Vital Signs BP 109/82 (BP Location: Left Arm)   Pulse 87   Temp 97.9 F (36.6 C) (Oral)   Resp 20   Ht 5\' 11"  (1.803 m)   Wt 107.5 kg   SpO2 98%   BMI 33.05 kg/m   Physical Exam Vitals and  nursing note reviewed.  Constitutional:      General: He is not in acute distress.    Appearance: He is well-developed. He is not diaphoretic.     Comments: Nontoxic appearing and in NAD  HENT:     Head: Normocephalic and atraumatic.  Eyes:     General: No scleral icterus.    Conjunctiva/sclera: Conjunctivae normal.  Cardiovascular:     Rate and Rhythm: Normal rate and regular rhythm.     Pulses: Normal pulses.  Pulmonary:     Effort: Pulmonary effort is normal. No respiratory distress.     Comments: Respirations even and unlabored Musculoskeletal:        General: Normal range of motion.     Cervical back: Normal range of motion.     Right lower leg: Edema present.     Left lower leg: Edema present.   Skin:    General: Skin is warm and dry.     Coloration: Skin is not pale.     Findings: No erythema or rash.  Neurological:     Mental Status: He is alert and oriented to person, place, and time.     Coordination: Coordination normal.  Psychiatric:        Behavior: Behavior normal.     ED Results / Procedures / Treatments   Labs (all labs ordered are listed, but only abnormal results are displayed) Labs Reviewed  BRAIN NATRIURETIC PEPTIDE - Abnormal; Notable for the following components:      Result Value   B Natriuretic Peptide 3,459.8 (*)    All other components within normal limits  BASIC METABOLIC PANEL - Abnormal; Notable for the following components:   Glucose, Bld 101 (*)    BUN 41 (*)    Creatinine, Ser 2.09 (*)    GFR, Estimated 36 (*)    All other components within normal limits  DIGOXIN LEVEL - Abnormal; Notable for the following components:   Digoxin Level 0.3 (*)    All other components within normal limits  CBC WITH DIFFERENTIAL/PLATELET    EKG EKG Interpretation Date/Time:  Friday October 10 2023 03:31:06 EST Ventricular Rate:  96 PR Interval:  207 QRS Duration:  119 QT Interval:  393 QTC Calculation: 497 R Axis:   131  Text Interpretation: Sinus rhythm Ventricular premature complex Prolonged PR interval Nonspecific intraventricular conduction delay Confirmed by Zadie Rhine ([id number]) on 10/10/2023 3:33:29 AM  Radiology DG Chest 2 View  Result Date: 10/10/2023 CLINICAL DATA:  Shortness of breath.  Orthopnea. EXAM: CHEST - 2 VIEW COMPARISON:  02/16/2023 FINDINGS: Prominent diffuse cardiac enlargement. Mild central vascular congestion. No airspace disease, edema, or consolidation. No pleural effusions. No pneumothorax. No significant change. IMPRESSION: Diffuse cardiac enlargement similar to prior study. Mild central vascular congestion. No consolidation or edema in the lungs. Electronically Signed   By: Burman Nieves M.D.   On: 10/10/2023 03:22     Procedures Procedures    Medications Ordered in ED Medications  famotidine (PEPCID) IVPB 20 mg premix (0 mg Intravenous Stopped 10/10/23 0404)  ondansetron (ZOFRAN) injection 4 mg (4 mg Intravenous Given 10/10/23 0331)  furosemide (LASIX) injection 60 mg (60 mg Intravenous Given 10/10/23 0351)    ED Course/ Medical Decision Making/ A&P Clinical Course as of 10/10/23 0727  Fri Oct 10, 2023  6045 Patient clinically well appearing considering his comorbidities. Diuresed ~600cc with one dose IV Lasix. CXR fairly reassuring and moving air well on exam with only minimally decreased breath sounds b/l bases. Able  to ambulate on room air without hypoxemia. Would benefit from close f/u in the advance heart failure clinic. Will consult cardiology to inquire about any medication adjustments pending f/u given his kidney function. [KH]  [phone] Spoke with Dr. Cristal Deer of cardiology who will reach out to Dr. [name], patient's primary cardiology; Will reach back out with rec's. [KH]    Clinical Course User Index [KH] Antony Madura, PA-C                                 Medical Decision Making Amount and/or Complexity of Data Reviewed Labs: ordered. Radiology: ordered. ECG/medicine tests: ordered.  Risk Prescription drug management.   This patient presents to the ED for concern of DOE and orthopnea, this involves an extensive number of treatment options, and is a complaint that carries with it a high risk of complications and morbidity.  The differential diagnosis includes CHF exacerbation vs pericardial effusion vs ACS vs PNA   Co morbidities that complicate the patient evaluation  HTN NICM/ICM Polysubstance abuse   Additional history obtained:  Additional history obtained from EMS personnel External records from outside source obtained and reviewed including echocardiogram from 05/2023 showing EF of <20%   Lab Tests:  I Ordered, and personally interpreted labs.  The pertinent results  include:  BNP 3459.8 (up from 2348). Creatinine 2.09 (up from 1.46).   Imaging Studies ordered:  I ordered imaging studies including CXR  I independently visualized and interpreted imaging which showed stable cardiomegaly. Mild vascular congestion I agree with the radiologist interpretation   Cardiac Monitoring:  The patient was maintained on a cardiac monitor.  I personally viewed and interpreted the cardiac monitored which showed an underlying rhythm of: NSR   Medicines ordered and prescription drug management:  I ordered medication including Lasix for diuresis  Reevaluation of the patient after these medicines showed that the patient  remained stable I have reviewed the patients home medicines and have made adjustments as needed   Test Considered:  Troponin   Consultations Obtained:  I requested consultation with Cardiology. Spoke with Dr. Cristal Deer. Rec's pending.   Problem List / ED Course:  As above   Reevaluation:  After the interventions noted above, I reevaluated the patient and found that they have :stayed the same   Social Determinants of Health:  Homeless    Dispostion:  Care signed out to [place], PA-C at shift change pending cardiology recommendations.         Final Clinical Impression(s) / ED Diagnoses Final diagnoses:  Acute on chronic combined systolic and diastolic CHF (congestive heart failure) (HCC)  AKI (acute kidney injury) [facility])    Rx / DC Orders ED Discharge Orders     None         Antony Madura, PA-C 10/10/23 1191    Zadie Rhine, MD 10/10/23 2308

## 2023-10-13 ENCOUNTER — Other Ambulatory Visit: Payer: Self-pay

## 2023-10-13 ENCOUNTER — Emergency Department (HOSPITAL_COMMUNITY): Payer: MEDICAID

## 2023-10-13 ENCOUNTER — Inpatient Hospital Stay (HOSPITAL_COMMUNITY)
Admission: EM | Admit: 2023-10-13 | Discharge: 2023-11-02 | DRG: 291 | Disposition: A | Discharge status: Home / Self Care | Payer: MEDICAID | Attending: Internal Medicine | Admitting: Internal Medicine

## 2023-10-13 ENCOUNTER — Telehealth (HOSPITAL_COMMUNITY): Payer: Self-pay | Admitting: Cardiology

## 2023-10-13 DIAGNOSIS — I472 Ventricular tachycardia, unspecified: Secondary | ICD-10-CM | POA: Diagnosis present

## 2023-10-13 DIAGNOSIS — E876 Hypokalemia: Secondary | ICD-10-CM | POA: Diagnosis present

## 2023-10-13 DIAGNOSIS — F332 Major depressive disorder, recurrent severe without psychotic features: Secondary | ICD-10-CM | POA: Diagnosis present

## 2023-10-13 DIAGNOSIS — Z6834 Body mass index (BMI) 34.0-34.9, adult: Secondary | ICD-10-CM

## 2023-10-13 DIAGNOSIS — Z7982 Long term (current) use of aspirin: Secondary | ICD-10-CM

## 2023-10-13 DIAGNOSIS — R45851 Suicidal ideations: Secondary | ICD-10-CM | POA: Diagnosis present

## 2023-10-13 DIAGNOSIS — I4901 Ventricular fibrillation: Secondary | ICD-10-CM | POA: Diagnosis not present

## 2023-10-13 DIAGNOSIS — I469 Cardiac arrest, cause unspecified: Secondary | ICD-10-CM

## 2023-10-13 DIAGNOSIS — E785 Hyperlipidemia, unspecified: Secondary | ICD-10-CM | POA: Diagnosis present

## 2023-10-13 DIAGNOSIS — N183 Chronic kidney disease, stage 3 unspecified: Secondary | ICD-10-CM | POA: Diagnosis present

## 2023-10-13 DIAGNOSIS — Z66 Do not resuscitate: Secondary | ICD-10-CM | POA: Diagnosis present

## 2023-10-13 DIAGNOSIS — I1310 Hypertensive heart and chronic kidney disease without heart failure, with stage 1 through stage 4 chronic kidney disease, or unspecified chronic kidney disease: Secondary | ICD-10-CM

## 2023-10-13 DIAGNOSIS — I959 Hypotension, unspecified: Secondary | ICD-10-CM | POA: Diagnosis not present

## 2023-10-13 DIAGNOSIS — F410 Panic disorder [episodic paroxysmal anxiety] without agoraphobia: Secondary | ICD-10-CM | POA: Diagnosis not present

## 2023-10-13 DIAGNOSIS — N179 Acute kidney failure, unspecified: Secondary | ICD-10-CM | POA: Diagnosis present

## 2023-10-13 DIAGNOSIS — Z5986 Financial insecurity: Secondary | ICD-10-CM

## 2023-10-13 DIAGNOSIS — Z8249 Family history of ischemic heart disease and other diseases of the circulatory system: Secondary | ICD-10-CM

## 2023-10-13 DIAGNOSIS — I462 Cardiac arrest due to underlying cardiac condition: Secondary | ICD-10-CM | POA: Diagnosis not present

## 2023-10-13 DIAGNOSIS — I428 Other cardiomyopathies: Secondary | ICD-10-CM | POA: Diagnosis present

## 2023-10-13 DIAGNOSIS — N189 Chronic kidney disease, unspecified: Secondary | ICD-10-CM | POA: Diagnosis present

## 2023-10-13 DIAGNOSIS — Z79899 Other long term (current) drug therapy: Secondary | ICD-10-CM

## 2023-10-13 DIAGNOSIS — Z91199 Patient's noncompliance with other medical treatment and regimen due to unspecified reason: Secondary | ICD-10-CM

## 2023-10-13 DIAGNOSIS — F4312 Post-traumatic stress disorder, chronic: Secondary | ICD-10-CM | POA: Diagnosis present

## 2023-10-13 DIAGNOSIS — F1411 Cocaine abuse, in remission: Secondary | ICD-10-CM | POA: Diagnosis present

## 2023-10-13 DIAGNOSIS — E872 Acidosis, unspecified: Secondary | ICD-10-CM | POA: Diagnosis present

## 2023-10-13 DIAGNOSIS — E875 Hyperkalemia: Secondary | ICD-10-CM | POA: Diagnosis not present

## 2023-10-13 DIAGNOSIS — Z91119 Patient's noncompliance with dietary regimen due to unspecified reason: Secondary | ICD-10-CM

## 2023-10-13 DIAGNOSIS — I2510 Atherosclerotic heart disease of native coronary artery without angina pectoris: Secondary | ICD-10-CM | POA: Diagnosis present

## 2023-10-13 DIAGNOSIS — Z5982 Transportation insecurity: Secondary | ICD-10-CM

## 2023-10-13 DIAGNOSIS — N1832 Chronic kidney disease, stage 3b: Secondary | ICD-10-CM | POA: Diagnosis present

## 2023-10-13 DIAGNOSIS — I5084 End stage heart failure: Secondary | ICD-10-CM | POA: Diagnosis present

## 2023-10-13 DIAGNOSIS — Z5900 Homelessness unspecified: Secondary | ICD-10-CM

## 2023-10-13 DIAGNOSIS — I130 Hypertensive heart and chronic kidney disease with heart failure and stage 1 through stage 4 chronic kidney disease, or unspecified chronic kidney disease: Principal | ICD-10-CM | POA: Diagnosis present

## 2023-10-13 DIAGNOSIS — I5082 Biventricular heart failure: Secondary | ICD-10-CM | POA: Diagnosis present

## 2023-10-13 DIAGNOSIS — Z9151 Personal history of suicidal behavior: Secondary | ICD-10-CM

## 2023-10-13 DIAGNOSIS — Z885 Allergy status to narcotic agent status: Secondary | ICD-10-CM

## 2023-10-13 DIAGNOSIS — K219 Gastro-esophageal reflux disease without esophagitis: Secondary | ICD-10-CM | POA: Diagnosis present

## 2023-10-13 DIAGNOSIS — I5020 Unspecified systolic (congestive) heart failure: Principal | ICD-10-CM

## 2023-10-13 DIAGNOSIS — I5023 Acute on chronic systolic (congestive) heart failure: Secondary | ICD-10-CM | POA: Diagnosis present

## 2023-10-13 DIAGNOSIS — E66813 Obesity, class 3: Secondary | ICD-10-CM | POA: Diagnosis present

## 2023-10-13 DIAGNOSIS — F1021 Alcohol dependence, in remission: Secondary | ICD-10-CM | POA: Diagnosis present

## 2023-10-13 DIAGNOSIS — Z5941 Food insecurity: Secondary | ICD-10-CM

## 2023-10-13 DIAGNOSIS — I272 Pulmonary hypertension, unspecified: Secondary | ICD-10-CM | POA: Diagnosis present

## 2023-10-13 DIAGNOSIS — J9601 Acute respiratory failure with hypoxia: Secondary | ICD-10-CM | POA: Diagnosis present

## 2023-10-13 DIAGNOSIS — R570 Cardiogenic shock: Secondary | ICD-10-CM

## 2023-10-13 DIAGNOSIS — I493 Ventricular premature depolarization: Secondary | ICD-10-CM | POA: Diagnosis present

## 2023-10-13 DIAGNOSIS — I255 Ischemic cardiomyopathy: Secondary | ICD-10-CM | POA: Diagnosis present

## 2023-10-13 DIAGNOSIS — D649 Anemia, unspecified: Secondary | ICD-10-CM | POA: Diagnosis present

## 2023-10-13 LAB — URINALYSIS, ROUTINE W REFLEX MICROSCOPIC
Bilirubin Urine: NEGATIVE
Glucose, UA: NEGATIVE mg/dL
Hgb urine dipstick: NEGATIVE
Ketones, ur: NEGATIVE mg/dL
Leukocytes,Ua: NEGATIVE
Nitrite: NEGATIVE
Protein, ur: NEGATIVE mg/dL
Specific Gravity, Urine: 1.008 (ref 1.005–1.030)
pH: 5 (ref 5.0–8.0)

## 2023-10-13 LAB — COMPREHENSIVE METABOLIC PANEL
ALT: 17 U/L (ref 0–44)
AST: 28 U/L (ref 15–41)
Albumin: 3.9 g/dL (ref 3.5–5.0)
Alkaline Phosphatase: 91 U/L (ref 38–126)
Anion gap: 14 (ref 5–15)
BUN: 56 mg/dL — ABNORMAL HIGH (ref 6–20)
CO2: 22 mmol/L (ref 22–32)
Calcium: 9.2 mg/dL (ref 8.9–10.3)
Chloride: 101 mmol/L (ref 98–111)
Creatinine, Ser: 2.11 mg/dL — ABNORMAL HIGH (ref 0.61–1.24)
GFR, Estimated: 35 mL/min — ABNORMAL LOW (ref >60)
Glucose, Bld: 98 mg/dL (ref 70–99)
Potassium: 3.5 mmol/L (ref 3.5–5.1)
Sodium: 137 mmol/L (ref 135–145)
Total Bilirubin: 1.6 mg/dL — ABNORMAL HIGH (ref <1.2)
Total Protein: 7.6 g/dL (ref 6.5–8.1)

## 2023-10-13 LAB — RAPID URINE DRUG SCREEN, HOSP PERFORMED
Amphetamines: NOT DETECTED
Barbiturates: NOT DETECTED
Benzodiazepines: NOT DETECTED
Cocaine: NOT DETECTED
Opiates: NOT DETECTED
Tetrahydrocannabinol: POSITIVE — AB

## 2023-10-13 LAB — CBC WITH DIFFERENTIAL/PLATELET
Abs Immature Granulocytes: 0.03 10*3/uL (ref 0.00–0.07)
Basophils Absolute: 0 10*3/uL (ref 0.0–0.1)
Basophils Relative: 1 %
Eosinophils Absolute: 0.1 10*3/uL (ref 0.0–0.5)
Eosinophils Relative: 1 %
HCT: 41.6 % (ref 39.0–52.0)
Hemoglobin: 13.6 g/dL (ref 13.0–17.0)
Immature Granulocytes: 1 %
Lymphocytes Relative: 14 %
Lymphs Abs: 0.9 10*3/uL (ref 0.7–4.0)
MCH: 30.6 pg (ref 26.0–34.0)
MCHC: 32.7 g/dL (ref 30.0–36.0)
MCV: 93.7 fL (ref 80.0–100.0)
Monocytes Absolute: 0.6 10*3/uL (ref 0.1–1.0)
Monocytes Relative: 10 %
Neutro Abs: 4.9 10*3/uL (ref 1.7–7.7)
Neutrophils Relative %: 73 %
Platelets: 284 10*3/uL (ref 150–400)
RBC: 4.44 MIL/uL (ref 4.22–5.81)
RDW: 15.3 % (ref 11.5–15.5)
WBC: 6.6 10*3/uL (ref 4.0–10.5)
nRBC: 0 % (ref 0.0–0.2)

## 2023-10-13 LAB — BRAIN NATRIURETIC PEPTIDE: B Natriuretic Peptide: >4500 pg/mL — ABNORMAL HIGH (ref 0.0–100.0)

## 2023-10-13 LAB — TROPONIN I (HIGH SENSITIVITY)
Troponin I (High Sensitivity): 61 ng/L — ABNORMAL HIGH (ref <18)
Troponin I (High Sensitivity): 60 ng/L — ABNORMAL HIGH (ref <18)

## 2023-10-13 LAB — ETHANOL: Alcohol, Ethyl (B): 13 mg/dL — ABNORMAL HIGH (ref <10)

## 2023-10-13 MED ORDER — POTASSIUM CHLORIDE CRYS ER 20 MEQ PO TBCR: 60.0000 meq | EXTENDED_RELEASE_TABLET | Freq: Two times a day (BID) | ORAL | 6 refills | Status: DC

## 2023-10-13 MED ORDER — TORSEMIDE 20 MG PO TABS: 80.0000 mg | ORAL_TABLET | Freq: Every day | ORAL | 6 refills | Status: DC

## 2023-10-13 MED ORDER — FUROSEMIDE 10 MG/ML IJ SOLN
60.0000 mg | Freq: Once | INTRAMUSCULAR | Status: AC
  Administered 2023-10-13: 60 mg via INTRAVENOUS
  Filled 2023-10-13: qty 8

## 2023-10-13 MED ORDER — METOLAZONE 2.5 MG PO TABS: 2.5000 mg | ORAL_TABLET | Freq: Every day | ORAL | 0 refills | Status: DC

## 2023-10-13 MED ORDER — ALUM & MAG HYDROXIDE-SIMETH 200-200-20 MG/5ML PO SUSP
30.0000 mL | Freq: Once | ORAL | Status: AC
  Administered 2023-10-13: 30 mL via ORAL
  Filled 2023-10-13: qty 30

## 2023-10-13 NOTE — Telephone Encounter (Signed)
Pt aware and voiced understanding  KCL clarified to 60bid

## 2023-10-13 NOTE — Telephone Encounter (Signed)
Pt called to report increase in LE edema severe SOB Seen in er was advised to increase torsemide to 40/20, reports he is taking 40BID and LE edema is "really bad". Reports he legs looks like elephant trunks.   SOB is severe-"its bad"  Offered add on for 12/9 @ 3-declined due to lack of transportation.   Please advise

## 2023-10-13 NOTE — ED Notes (Signed)
Pt went to bathroom without alerting NT and could not complete a urinalysis

## 2023-10-13 NOTE — ED Notes (Signed)
Pt self ambulated to restroom with supervision

## 2023-10-13 NOTE — ED Notes (Signed)
Save red tube in main lab 

## 2023-10-13 NOTE — ED Provider Triage Note (Signed)
Emergency Medicine Provider Triage Evaluation Note  Dale [name] , a 62 y.o. male  was evaluated in triage.  Pt complains of severe lower extremity edema and shortness of breath.    Review of Systems  Positive: shortness of breath, chest tightness lower extremity edema Negative: Suicidal, homicidal, chest pain, abdominal pain nausea vomiting diarrhea.  Physical Exam  BP 125/89 (BP Location: Left Arm)   Pulse (!) 103   Temp 97.9 F (36.6 C) (Oral)   Resp 18   SpO2 99%  Gen:   Awake, no distress   Resp:  Normal effort, Rales MSK:   Moves extremities without difficulty  Other:  Significant pitting edema to lower extremity, mild erythema and tenderness bilaterally.  Medical Decision Making  Medically screening exam initiated at 6:20 PM.  Appropriate orders placed.  Dale [name] was informed that the remainder of the evaluation will be completed by another provider, this initial triage assessment does not replace that evaluation, and the importance of remaining in the ED until their evaluation is complete.  He has been dealing with his lower extremity edema for a long time however is starting to worsen.  He also reports he has had increase in his Lasix dosage and has not been urinating regularly.  Patient endorses feeling down and depressed secondary to a stressful event.  Denies current SI or HI to me.   Smitty Knudsen, PA-C 10/13/23 1610

## 2023-10-13 NOTE — ED Notes (Addendum)
Pt changed into gown & given food (sausage biscuit) & drink per MD directions

## 2023-10-13 NOTE — ED Provider Notes (Addendum)
[place] EMERGENCY DEPARTMENT AT [facility] Provider Note   CSN: [id number] Arrival date & time: 10/13/23  1741     History  Chief Complaint  Patient presents with   Shortness of Breath   Leg Swelling   Suicidal    Dale [name] is a 62 y.o. male.  Patient complains of shortness of breath.  He has congestive heart failure and was seen in the ED 3 days ago and cardiology increased his diuretic.  Patient states that he is not urinating very much and has more swelling in his legs  The history is provided by the patient and medical records. No language interpreter was used.  Shortness of Breath Severity:  Moderate Onset quality:  Sudden Timing:  Constant Progression:  Worsening Chronicity:  Recurrent Context: activity   Relieved by:  Nothing Worsened by:  Nothing Ineffective treatments:  None tried Associated symptoms: no abdominal pain, no chest pain, no cough, no headaches and no rash        Home Medications Prior to Admission medications   Medication Sig Start Date End Date Taking? Authorizing Provider  aspirin EC 81 MG tablet Take 1 tablet (81 mg total) by mouth daily. Swallow whole. 06/04/23   Laurey Morale, MD  atorvastatin (LIPITOR) 40 MG tablet Take 1 tablet (40 mg total) by mouth daily. 06/04/23   Laurey Morale, MD  dapagliflozin propanediol (FARXIGA) 10 MG TABS tablet Take 1 tablet (10 mg total) by mouth daily. 06/04/23   Laurey Morale, MD  digoxin (LANOXIN) 0.125 MG tablet Take 0.5 tablets (0.0625 mg total) by mouth daily. 06/04/23   Laurey Morale, MD  famotidine (PEPCID) 20 MG tablet Take 20 mg by mouth 2 (two) times daily. 08/13/23   [provider]  metolazone (ZAROXOLYN) 2.5 MG tablet Take 1 tablet (2.5 mg total) by mouth daily for 2 days. With additional 40 meq of potassium 10/13/23 10/15/23  Milford, Anderson Malta, FNP  ondansetron (ZOFRAN) 4 MG tablet Take 1 tablet (4 mg total) by mouth every 8 (eight) hours as needed for  nausea or vomiting. 04/03/23 04/02/24  Jacklynn Ganong, FNP  ondansetron (ZOFRAN-ODT) 4 MG disintegrating tablet Take 4 mg by mouth every 8 (eight) hours as needed. 08/13/23   [provider]  pantoprazole (PROTONIX) 40 MG tablet Take 1 tablet (40 mg total) by mouth daily. 02/19/23   Mapp, Gaylyn Cheers, MD  potassium chloride SA (KLOR-CON M) 20 MEQ tablet Take 3 tablets (60 mEq total) by mouth 2 (two) times daily. With additional with metolazone 10/13/23   Milford, Anderson Malta, FNP  sildenafil (VIAGRA) 50 MG tablet Take 1 tablet (50 mg total) by mouth daily as needed for erectile dysfunction. 06/04/23   Laurey Morale, MD  spironolactone (ALDACTONE) 25 MG tablet Take 0.5 tablets (12.5 mg total) by mouth daily. 06/04/23 05/29/24  Laurey Morale, MD  torsemide (DEMADEX) 20 MG tablet Take 4 tablets (80 mg total) by mouth daily. 10/13/23   Jacklynn Ganong, FNP      Allergies    Hydrocodone    Review of Systems   Review of Systems  Constitutional:  Negative for appetite change and fatigue.  HENT:  Negative for congestion, ear discharge and sinus pressure.   Eyes:  Negative for discharge.  Respiratory:  Positive for shortness of breath. Negative for cough.   Cardiovascular:  Negative for chest pain.  Gastrointestinal:  Negative for abdominal pain and diarrhea.  Genitourinary:  Negative for frequency and  hematuria.  Musculoskeletal:  Negative for back pain.  Skin:  Negative for rash.  Neurological:  Negative for seizures and headaches.  Psychiatric/Behavioral:  Negative for hallucinations.     Physical Exam Updated Vital Signs BP (!) 116/96   Pulse (!) 105   Temp 97.7 F (36.5 C) (Oral)   Resp 16   Ht 5\' 11"  (1.803 m)   Wt 107 kg   SpO2 95%   BMI 32.90 kg/m  Physical Exam Vitals and nursing note reviewed.  Constitutional:      Appearance: He is well-developed.  HENT:     Head: Normocephalic.     Nose: Nose normal.  Eyes:     General: No scleral icterus.     Conjunctiva/sclera: Conjunctivae normal.  Neck:     Thyroid: No thyromegaly.  Cardiovascular:     Rate and Rhythm: Normal rate and regular rhythm.     Heart sounds: No murmur heard.    No friction rub. No gallop.  Pulmonary:     Breath sounds: No stridor. No wheezing or rales.  Chest:     Chest wall: No tenderness.  Abdominal:     General: There is no distension.     Tenderness: There is no abdominal tenderness. There is no rebound.  Musculoskeletal:        General: Normal range of motion.     Cervical back: Neck supple.     Comments: 3+ edema both legs  Lymphadenopathy:     Cervical: No cervical adenopathy.  Skin:    Findings: No erythema or rash.  Neurological:     Mental Status: He is alert and oriented to person, place, and time.     Motor: No abnormal muscle tone.     Coordination: Coordination normal.  Psychiatric:        Behavior: Behavior normal.     ED Results / Procedures / Treatments   Labs (all labs ordered are listed, but only abnormal results are displayed) Labs Reviewed  COMPREHENSIVE METABOLIC PANEL - Abnormal; Notable for the following components:      Result Value   BUN 56 (*)    Creatinine, Ser 2.11 (*)    Total Bilirubin 1.6 (*)    GFR, Estimated 35 (*)    All other components within normal limits  ETHANOL - Abnormal; Notable for the following components:   Alcohol, Ethyl (B) 13 (*)    All other components within normal limits  BRAIN NATRIURETIC PEPTIDE - Abnormal; Notable for the following components:   B Natriuretic Peptide >4,500.0 (*)    All other components within normal limits  TROPONIN I (HIGH SENSITIVITY) - Abnormal; Notable for the following components:   Troponin I (High Sensitivity) 61 (*)    All other components within normal limits  TROPONIN I (HIGH SENSITIVITY) - Abnormal; Notable for the following components:   Troponin I (High Sensitivity) 60 (*)    All other components within normal limits  CBC WITH DIFFERENTIAL/PLATELET   URINALYSIS, ROUTINE W REFLEX MICROSCOPIC  RAPID URINE DRUG SCREEN, HOSP PERFORMED    EKG None  Radiology DG Chest 2 View  Result Date: 10/13/2023 CLINICAL DATA:  Shortness of breath. EXAM: CHEST - 2 VIEW COMPARISON:  Chest radiograph dated 10/10/2023. FINDINGS: Cardiomegaly with mild central vascular congestion. No focal consolidation, pleural effusion, or pneumothorax. No acute osseous pathology. IMPRESSION: Cardiomegaly with mild central vascular congestion. Electronically Signed   By: Elgie Collard M.D.   On: 10/13/2023 19:39    Procedures Procedures  Medications Ordered in ED Medications  furosemide (LASIX) injection 60 mg (60 mg Intravenous Given 10/13/23 1943)  alum & mag hydroxide-simeth (MAALOX/MYLANTA) 200-200-20 MG/5ML suspension 30 mL (30 mLs Oral Given 10/13/23 2245)    ED Course/ Medical Decision Making/ A&P    CRITICAL CARE Performed by: Bethann Berkshire Total critical care time: 40 minutes Critical care time was exclusive of separately billable procedures and treating other patients. Critical care was necessary to treat or prevent imminent or life-threatening deterioration. Critical care was time spent personally by me on the following activities: development of treatment plan with patient and/or surrogate as well as nursing, discussions with consultants, evaluation of patient's response to treatment, examination of patient, obtaining history from patient or surrogate, ordering and performing treatments and interventions, ordering and review of laboratory studies, ordering and review of radiographic studies, pulse oximetry and re-evaluation of patient's condition.                              Medical Decision Making Risk OTC drugs. Prescription drug management. Decision regarding hospitalization.  This patient presents to the ED for concern of shortness of breath and swelling in legs, this involves an extensive number of treatment options, and is a complaint  that carries with it a high risk of complications and morbidity.  The differential diagnosis includes congestive heart failure and anasarca   Co morbidities that complicate the patient evaluation  Congestive heart failure   Additional history obtained:  Additional history obtained from patient External records from outside source obtained and reviewed including hospital records   Lab Tests:  I Ordered, and personally interpreted labs.  The pertinent results include: BNP greater than 4500   Imaging Studies ordered:  I ordered imaging studies including chest x-ray I independently visualized and interpreted imaging which showed cardiomegaly and vascular congestion I agree with the radiologist interpretation   Cardiac Monitoring: / EKG:  The patient was maintained on a cardiac monitor.  I personally viewed and interpreted the cardiac monitored which showed an underlying rhythm of: Normal sinus rhythm   Consultations Obtained:  I requested consultation with the hospitalist,  and discussed lab and imaging findings as well as pertinent plan - they recommend: Admit   Problem List / ED Course / Critical interventions / Medication management  Congestive heart failure I ordered medication including Lasix Reevaluation of the patient after these medicines showed that the patient improved I have reviewed the patients home medicines and have made adjustments as needed   Social Determinants of Health:  None   Test / Admission - Considered: None   Patient with anasarca.  Patient will be admitted to medicine and diuresed        Final Clinical Impression(s) / ED Diagnoses Final diagnoses:  None    Rx / DC Orders ED Discharge Orders     None         Bethann Berkshire, MD 10/14/23 1610    Bethann Berkshire, MD 12/01/23 1257

## 2023-10-13 NOTE — ED Triage Notes (Signed)
Pt BIBA. C/O SOB and severe pitting edema for several days. Was given LASIX, but feels this has had no effect, and they have only urinated once today.   Pt endorses SI

## 2023-10-13 NOTE — ED Notes (Signed)
Attempted to do bladder scan with another tech assistance [facility]) , pt was not cooperative to continue. Pt wanted his phone when it rang saying it may be the sheriff he can get locked up if he doesn't answer, an this can wait its not important. Pt also kept stating he couldn't breathe laying flat on his back. Pt was informed that he has to lay flat in order to get an accurate reading on the bladder scanner.

## 2023-10-14 ENCOUNTER — Encounter (HOSPITAL_COMMUNITY): Payer: Self-pay | Admitting: Internal Medicine

## 2023-10-14 ENCOUNTER — Other Ambulatory Visit: Payer: Self-pay

## 2023-10-14 DIAGNOSIS — D649 Anemia, unspecified: Secondary | ICD-10-CM | POA: Diagnosis present

## 2023-10-14 DIAGNOSIS — I272 Pulmonary hypertension, unspecified: Secondary | ICD-10-CM | POA: Diagnosis present

## 2023-10-14 DIAGNOSIS — R45851 Suicidal ideations: Secondary | ICD-10-CM | POA: Diagnosis present

## 2023-10-14 DIAGNOSIS — Z515 Encounter for palliative care: Secondary | ICD-10-CM | POA: Diagnosis not present

## 2023-10-14 DIAGNOSIS — E875 Hyperkalemia: Secondary | ICD-10-CM | POA: Diagnosis not present

## 2023-10-14 DIAGNOSIS — Z7189 Other specified counseling: Secondary | ICD-10-CM | POA: Diagnosis not present

## 2023-10-14 DIAGNOSIS — I2510 Atherosclerotic heart disease of native coronary artery without angina pectoris: Secondary | ICD-10-CM | POA: Diagnosis not present

## 2023-10-14 DIAGNOSIS — E872 Acidosis, unspecified: Secondary | ICD-10-CM | POA: Diagnosis present

## 2023-10-14 DIAGNOSIS — F1411 Cocaine abuse, in remission: Secondary | ICD-10-CM | POA: Diagnosis present

## 2023-10-14 DIAGNOSIS — R453 Demoralization and apathy: Secondary | ICD-10-CM | POA: Diagnosis not present

## 2023-10-14 DIAGNOSIS — I5021 Acute systolic (congestive) heart failure: Secondary | ICD-10-CM | POA: Diagnosis not present

## 2023-10-14 DIAGNOSIS — N1832 Chronic kidney disease, stage 3b: Secondary | ICD-10-CM | POA: Diagnosis present

## 2023-10-14 DIAGNOSIS — F1021 Alcohol dependence, in remission: Secondary | ICD-10-CM | POA: Diagnosis present

## 2023-10-14 DIAGNOSIS — I5082 Biventricular heart failure: Secondary | ICD-10-CM | POA: Diagnosis present

## 2023-10-14 DIAGNOSIS — N1831 Chronic kidney disease, stage 3a: Secondary | ICD-10-CM

## 2023-10-14 DIAGNOSIS — J9601 Acute respiratory failure with hypoxia: Secondary | ICD-10-CM | POA: Diagnosis present

## 2023-10-14 DIAGNOSIS — Z5900 Homelessness unspecified: Secondary | ICD-10-CM | POA: Diagnosis not present

## 2023-10-14 DIAGNOSIS — I472 Ventricular tachycardia, unspecified: Secondary | ICD-10-CM | POA: Diagnosis present

## 2023-10-14 DIAGNOSIS — F4312 Post-traumatic stress disorder, chronic: Secondary | ICD-10-CM | POA: Diagnosis not present

## 2023-10-14 DIAGNOSIS — R609 Edema, unspecified: Secondary | ICD-10-CM | POA: Diagnosis not present

## 2023-10-14 DIAGNOSIS — R570 Cardiogenic shock: Secondary | ICD-10-CM | POA: Diagnosis not present

## 2023-10-14 DIAGNOSIS — I5043 Acute on chronic combined systolic (congestive) and diastolic (congestive) heart failure: Secondary | ICD-10-CM | POA: Diagnosis not present

## 2023-10-14 DIAGNOSIS — N179 Acute kidney failure, unspecified: Secondary | ICD-10-CM | POA: Diagnosis present

## 2023-10-14 DIAGNOSIS — F1010 Alcohol abuse, uncomplicated: Secondary | ICD-10-CM | POA: Diagnosis not present

## 2023-10-14 DIAGNOSIS — I5023 Acute on chronic systolic (congestive) heart failure: Secondary | ICD-10-CM | POA: Diagnosis present

## 2023-10-14 DIAGNOSIS — Z66 Do not resuscitate: Secondary | ICD-10-CM | POA: Diagnosis present

## 2023-10-14 DIAGNOSIS — I469 Cardiac arrest, cause unspecified: Secondary | ICD-10-CM | POA: Diagnosis not present

## 2023-10-14 DIAGNOSIS — I5084 End stage heart failure: Secondary | ICD-10-CM | POA: Diagnosis present

## 2023-10-14 DIAGNOSIS — N189 Chronic kidney disease, unspecified: Secondary | ICD-10-CM

## 2023-10-14 DIAGNOSIS — I5020 Unspecified systolic (congestive) heart failure: Secondary | ICD-10-CM | POA: Diagnosis not present

## 2023-10-14 DIAGNOSIS — E785 Hyperlipidemia, unspecified: Secondary | ICD-10-CM | POA: Diagnosis present

## 2023-10-14 DIAGNOSIS — I4901 Ventricular fibrillation: Secondary | ICD-10-CM | POA: Diagnosis not present

## 2023-10-14 DIAGNOSIS — N183 Chronic kidney disease, stage 3 unspecified: Secondary | ICD-10-CM | POA: Diagnosis not present

## 2023-10-14 DIAGNOSIS — I4729 Other ventricular tachycardia: Secondary | ICD-10-CM | POA: Diagnosis not present

## 2023-10-14 DIAGNOSIS — I428 Other cardiomyopathies: Secondary | ICD-10-CM | POA: Diagnosis present

## 2023-10-14 DIAGNOSIS — F332 Major depressive disorder, recurrent severe without psychotic features: Secondary | ICD-10-CM | POA: Diagnosis present

## 2023-10-14 DIAGNOSIS — I130 Hypertensive heart and chronic kidney disease with heart failure and stage 1 through stage 4 chronic kidney disease, or unspecified chronic kidney disease: Secondary | ICD-10-CM | POA: Diagnosis present

## 2023-10-14 DIAGNOSIS — I462 Cardiac arrest due to underlying cardiac condition: Secondary | ICD-10-CM | POA: Diagnosis not present

## 2023-10-14 DIAGNOSIS — Z6834 Body mass index (BMI) 34.0-34.9, adult: Secondary | ICD-10-CM | POA: Diagnosis not present

## 2023-10-14 LAB — BLOOD GAS, VENOUS
Acid-Base Excess: 1.1 mmol/L (ref 0.0–2.0)
Acid-Base Excess: 0.3 mmol/L (ref 0.0–2.0)
Bicarbonate: 25.1 mmol/L (ref 20.0–28.0)
Bicarbonate: 25.4 mmol/L (ref 20.0–28.0)
O2 Saturation: 94.2 %
O2 Saturation: 67.4 %
Patient temperature: 37
Patient temperature: 37
pCO2, Ven: 37 mm[Hg] — ABNORMAL LOW (ref 44–60)
pCO2, Ven: 42 mm[Hg] — ABNORMAL LOW (ref 44–60)
pH, Ven: 7.44 — ABNORMAL HIGH (ref 7.25–7.43)
pH, Ven: 7.39 (ref 7.25–7.43)
pO2, Ven: 68 mm[Hg] — ABNORMAL HIGH (ref 32–45)
pO2, Ven: 44 mm[Hg] (ref 32–45)

## 2023-10-14 LAB — BASIC METABOLIC PANEL
Anion gap: 15 (ref 5–15)
BUN: 64 mg/dL — ABNORMAL HIGH (ref 6–20)
CO2: 23 mmol/L (ref 22–32)
Calcium: 9.3 mg/dL (ref 8.9–10.3)
Chloride: 100 mmol/L (ref 98–111)
Creatinine, Ser: 2.11 mg/dL — ABNORMAL HIGH (ref 0.61–1.24)
GFR, Estimated: 35 mL/min — ABNORMAL LOW (ref >60)
Glucose, Bld: 117 mg/dL — ABNORMAL HIGH (ref 70–99)
Potassium: 3.3 mmol/L — ABNORMAL LOW (ref 3.5–5.1)
Sodium: 138 mmol/L (ref 135–145)

## 2023-10-14 LAB — MRSA NEXT GEN BY PCR, NASAL: MRSA by PCR Next Gen: NOT DETECTED

## 2023-10-14 LAB — LACTIC ACID, PLASMA: Lactic Acid, Venous: 1.8 mmol/L (ref 0.5–1.9)

## 2023-10-14 LAB — HIV ANTIBODY (ROUTINE TESTING W REFLEX): HIV Screen 4th Generation wRfx: NONREACTIVE

## 2023-10-14 MED ORDER — FAMOTIDINE 20 MG PO TABS
20.0000 mg | ORAL_TABLET | Freq: Two times a day (BID) | ORAL | Status: DC
  Administered 2023-10-14 – 2023-10-21 (×15): 20 mg via ORAL
  Filled 2023-10-14 (×15): qty 1

## 2023-10-14 MED ORDER — ORAL CARE MOUTH RINSE: 15.0000 mL | OROMUCOSAL | Status: DC | PRN

## 2023-10-14 MED ORDER — DEXTROSE 5 % IV SOLN
20.0000 mg/h | INTRAVENOUS | Status: DC
  Administered 2023-10-14 – 2023-10-17 (×8): 20 mg/h via INTRAVENOUS
  Filled 2023-10-14 (×12): qty 20

## 2023-10-14 MED ORDER — SODIUM CHLORIDE 0.9% FLUSH: 3.0000 mL | INTRAVENOUS | Status: DC | PRN

## 2023-10-14 MED ORDER — CHLORHEXIDINE GLUCONATE CLOTH 2 % EX PADS
6.0000 | MEDICATED_PAD | Freq: Every day | CUTANEOUS | Status: DC
  Administered 2023-10-14 – 2023-10-30 (×15): 6 via TOPICAL

## 2023-10-14 MED ORDER — FUROSEMIDE 10 MG/ML IJ SOLN: 80.0000 mg | Freq: Two times a day (BID) | INTRAMUSCULAR | Status: DC

## 2023-10-14 MED ORDER — SODIUM CHLORIDE 0.9% FLUSH
3.0000 mL | Freq: Two times a day (BID) | INTRAVENOUS | Status: DC
  Administered 2023-10-14 (×3): 3 mL via INTRAVENOUS

## 2023-10-14 MED ORDER — POTASSIUM CHLORIDE CRYS ER 20 MEQ PO TBCR
40.0000 meq | EXTENDED_RELEASE_TABLET | Freq: Once | ORAL | Status: AC
  Administered 2023-10-14: 40 meq via ORAL
  Filled 2023-10-14: qty 2

## 2023-10-14 MED ORDER — ENOXAPARIN SODIUM 40 MG/0.4ML IJ SOSY
40.0000 mg | PREFILLED_SYRINGE | INTRAMUSCULAR | Status: DC
  Administered 2023-10-14: 40 mg via SUBCUTANEOUS
  Filled 2023-10-14: qty 0.4

## 2023-10-14 MED ORDER — HEPARIN SODIUM (PORCINE) 5000 UNIT/ML IJ SOLN
5000.0000 [IU] | Freq: Three times a day (TID) | INTRAMUSCULAR | Status: DC
  Filled 2023-10-14 (×2): qty 1

## 2023-10-14 MED ORDER — ACETAMINOPHEN 325 MG PO TABS: 650.0000 mg | ORAL_TABLET | ORAL | Status: DC | PRN

## 2023-10-14 MED ORDER — THIAMINE HCL 100 MG/ML IJ SOLN
100.0000 mg | INTRAMUSCULAR | Status: DC
  Administered 2023-10-14 – 2023-10-15 (×2): 100 mg via INTRAVENOUS
  Filled 2023-10-14 (×2): qty 2

## 2023-10-14 MED ORDER — FUROSEMIDE 10 MG/ML IJ SOLN
60.0000 mg | Freq: Once | INTRAMUSCULAR | Status: AC
  Administered 2023-10-14: 60 mg via INTRAVENOUS
  Filled 2023-10-14: qty 8

## 2023-10-14 MED ORDER — SPIRONOLACTONE 12.5 MG HALF TABLET
12.5000 mg | ORAL_TABLET | Freq: Every day | ORAL | Status: DC
  Administered 2023-10-14: 12.5 mg via ORAL
  Filled 2023-10-14 (×2): qty 1

## 2023-10-14 MED ORDER — LORAZEPAM 2 MG/ML IJ SOLN
0.5000 mg | INTRAMUSCULAR | Status: DC | PRN
  Administered 2023-10-14 (×4): 0.5 mg via INTRAVENOUS
  Filled 2023-10-14 (×4): qty 1

## 2023-10-14 MED ORDER — ORAL CARE MOUTH RINSE: 15.0000 mL | OROMUCOSAL | Status: DC

## 2023-10-14 MED ORDER — MILRINONE LACTATE IN DEXTROSE 20-5 MG/100ML-% IV SOLN
0.2500 ug/kg/min | INTRAVENOUS | Status: DC
  Administered 2023-10-14 – 2023-10-20 (×13): 0.25 ug/kg/min via INTRAVENOUS
  Filled 2023-10-14 (×14): qty 100

## 2023-10-14 MED ORDER — ASPIRIN 81 MG PO TBEC
81.0000 mg | DELAYED_RELEASE_TABLET | Freq: Every day | ORAL | Status: DC
  Administered 2023-10-14 – 2023-10-23 (×10): 81 mg via ORAL
  Filled 2023-10-14 (×10): qty 1

## 2023-10-14 MED ORDER — ATORVASTATIN CALCIUM 40 MG PO TABS
40.0000 mg | ORAL_TABLET | Freq: Every day | ORAL | Status: DC
Start: 2023-10-14 — End: 2023-10-24
  Administered 2023-10-14 – 2023-10-23 (×10): 40 mg via ORAL
  Filled 2023-10-14 (×10): qty 1

## 2023-10-14 MED ORDER — DEXTROSE 5 % IV SOLN
250.0000 mg | Freq: Once | INTRAVENOUS | Status: AC
  Administered 2023-10-14: 250 mg via INTRAVENOUS
  Filled 2023-10-14 (×2): qty 8.9

## 2023-10-14 MED ORDER — SODIUM CHLORIDE 0.9 % IV SOLN: 250.0000 mL | INTRAVENOUS | Status: DC | PRN

## 2023-10-14 NOTE — Progress Notes (Signed)
   10/14/23 0247  BiPAP/CPAP/SIPAP  $ Non-Invasive Ventilator  Non-Invasive Vent Initial  $ Face Mask Large  Yes  BiPAP/CPAP/SIPAP Pt Type Adult  BiPAP/CPAP/SIPAP V60  Mask Type Full face mask  Mask Size Large  Set Rate 10 breaths/min  Respiratory Rate 38 breaths/min  IPAP 10 cmH20  EPAP 5 cmH2O  FiO2 (%) 30 %  Flow Rate 0 lpm  Minute Ventilation 22.4  Leak 0  Peak Inspiratory Pressure (PIP) 11  Tidal Volume (Vt) 646  Patient Home Equipment No  Auto Titrate No  Press High Alarm 30 cmH2O  Press Low Alarm 5 cmH2O   PT placed on bipap per MD,

## 2023-10-14 NOTE — Assessment & Plan Note (Signed)
BAL of just 13 today, almost neg but not quite. Watch for signs / symptoms of withdrawal while here. Will order thiamine.

## 2023-10-14 NOTE — Progress Notes (Signed)
PT admit H&P has suicidal ideation as an admission diagnosis. PT questioned about this by myself. He stated he has had no recent thoughts or attempts at suicide. Nursing staff has not heard any other mention of SI other than admit provider documentation. Will continue to assess for safety concerns.

## 2023-10-14 NOTE — Progress Notes (Signed)
RT note: Pt. Transported w/o difficulty to [facility]/ICU/SD while on 3 lpm n/c, replaced BiPAP V60 once in Unit, RT covering aware.

## 2023-10-14 NOTE — ED Notes (Signed)
ED TO INPATIENT HANDOFF REPORT  ED Nurse Name and Phone #:   S Name/Age/Gender Dale [name] 62 y.o. male Room/Bed: WA09/WA09  Code Status   Code Status: Full Code  Home/SNF/Other Home Patient oriented to: self, place, time, and situation. While sleeping pt seems to wake up disoriented Is this baseline? Yes   Triage Complete: Triage complete  Chief Complaint Acute on chronic HFrEF (heart failure with reduced ejection fraction) (HCC) [I50.23]  Triage Note Pt BIBA. C/O SOB and severe pitting edema for several days. Was given LASIX, but feels this has had no effect, and they have only urinated once today.   Pt endorses SI   Allergies Allergies  Allergen Reactions   Hydrocodone Itching    Level of Care/Admitting Diagnosis ED Disposition     ED Disposition  Admit   Condition  --   Comment  Hospital Area: [facility] [[id number]]  Level of Care: Stepdown [14]  Admit to SDU based on following criteria: Respiratory Distress:  Frequent assessment and/or intervention to maintain adequate ventilation/respiration, pulmonary toilet, and respiratory treatment.  May [facility] patient in [facility] at [facility] or [facility] if equivalent level of care is available:: No  Covid Evaluation: Asymptomatic - no recent exposure (last 10 days) testing not required  Diagnosis: Acute on chronic HFrEF (heart failure with reduced ejection fraction) [facility]) [[id number]]  Admitting Physician: Hillary Bow [[id number]]  Attending Physician: Hillary Bow [[id number]]          B Medical/Surgery History Past Medical History:  Diagnosis Date   Alcohol abuse    CAD (coronary artery disease)    a. 05/2019 Cath: LM nl, LAD 15p, 40m, LCX large/nl, OM2 30, OM3 20, RCA 100 CTO w/ bridging L->R collats to RPDA.   Chronic combined systolic and diastolic CHF (congestive heart failure) (HCC)    a. 09/27/18 Echo: EF 20-25%, grade 2 DD; b. 03/2019 Echo: EF 20-25%; c. 05/2021 Echo: EF <10%,  glob HK. GrII DD. Sev red RV fxn. Sev BAE. Mild MR. Mild-mod TR.   CKD (chronic kidney disease), stage II    Cocaine abuse (HCC)    Hyperlipidemia LDL goal <70    Hypertension    Ischemic cardiomyopathy    a. 09/27/18 Echo: EF 20-25%, grade 2 DD; b. 03/2019 Echo: EF 20-25%; c. 05/2019 Cath: RCA 100 CTA, otw nonobs dzs; d. 05/2021 Echo: EF <10%, glob HK. GrII DD. Sev red RV fxn.   Morbid obesity (HCC)    Noncompliance    Normocytic anemia    Past Surgical History:  Procedure Laterality Date   RIGHT/LEFT HEART CATH AND CORONARY ANGIOGRAPHY N/A 05/10/2019   Procedure: RIGHT/LEFT HEART CATH AND CORONARY ANGIOGRAPHY;  Surgeon: Yvonne Kendall, MD;  Location: [facility] INVASIVE CV LAB;  Service: Cardiovascular;  Laterality: N/A;   RIGHT/LEFT HEART CATH AND CORONARY ANGIOGRAPHY N/A 03/18/2022   Procedure: RIGHT/LEFT HEART CATH AND CORONARY ANGIOGRAPHY;  Surgeon: Laurey Morale, MD;  Location: [facility] INVASIVE CV LAB;  Service: Cardiovascular;  Laterality: N/A;     A IV Location/Drains/Wounds Patient Lines/Drains/Airways Status     Active Line/Drains/Airways     Name Placement date Placement time Site Days   Peripheral IV 10/13/23 20 G 1" Anterior;Right Hand 10/13/23  1941  Hand  1            Intake/Output Last 24 hours  Intake/Output Summary (Last 24 hours) at 10/14/2023 0413 Last data filed at 10/14/2023 0248 Gross per 24 hour  Intake --  Output  200 ml  Net -200 ml    Labs/Imaging Results for orders placed or performed during the hospital encounter of 10/13/23 (from the past 48 hour(s))  Comprehensive metabolic panel     Status: Abnormal   Collection Time: 10/13/23  6:30 PM  Result Value Ref Range   Sodium 137 135 - 145 mmol/L   Potassium 3.5 3.5 - 5.1 mmol/L   Chloride 101 98 - 111 mmol/L   CO2 22 22 - 32 mmol/L   Glucose, Bld 98 70 - 99 mg/dL    Comment: Glucose reference range applies only to samples taken after fasting for at least 8 hours.   BUN 56 (H) 6 - 20 mg/dL    Creatinine, Ser 1.61 (H) 0.61 - 1.24 mg/dL   Calcium 9.2 8.9 - 09.6 mg/dL   Total Protein 7.6 6.5 - 8.1 g/dL   Albumin 3.9 3.5 - 5.0 g/dL   AST 28 15 - 41 U/L   ALT 17 0 - 44 U/L   Alkaline Phosphatase 91 38 - 126 U/L   Total Bilirubin 1.6 (H) <1.2 mg/dL   GFR, Estimated 35 (L) >60 mL/min    Comment: (NOTE) Calculated using the CKD-EPI Creatinine Equation (2021)    Anion gap 14 5 - 15    Comment: Performed at [facility], [address] [place], [place] [address]  Ethanol     Status: Abnormal   Collection Time: 10/13/23  6:30 PM  Result Value Ref Range   Alcohol, Ethyl (B) 13 (H) <10 mg/dL    Comment: (NOTE) Lowest detectable limit for serum alcohol is 10 mg/dL.  For medical purposes only. Performed at [facility], [address] [place], [place] [address]   CBC with Diff     Status: None   Collection Time: 10/13/23  6:30 PM  Result Value Ref Range   WBC 6.6 4.0 - 10.5 K/uL   RBC 4.44 4.22 - 5.81 MIL/uL   Hemoglobin 13.6 13.0 - 17.0 g/dL   HCT 14.7 82.9 - 56.2 %   MCV 93.7 80.0 - 100.0 fL   MCH 30.6 26.0 - 34.0 pg   MCHC 32.7 30.0 - 36.0 g/dL   RDW 13.0 86.5 - 78.4 %   Platelets 284 150 - 400 K/uL   nRBC 0.0 0.0 - 0.2 %   Neutrophils Relative % 73 %   Neutro Abs 4.9 1.7 - 7.7 K/uL   Lymphocytes Relative 14 %   Lymphs Abs 0.9 0.7 - 4.0 K/uL   Monocytes Relative 10 %   Monocytes Absolute 0.6 0.1 - 1.0 K/uL   Eosinophils Relative 1 %   Eosinophils Absolute 0.1 0.0 - 0.5 K/uL   Basophils Relative 1 %   Basophils Absolute 0.0 0.0 - 0.1 K/uL   Immature Granulocytes 1 %   Abs Immature Granulocytes 0.03 0.00 - 0.07 K/uL    Comment: Performed at [facility], [address] [place], [place] [address]  Brain natriuretic peptide     Status: Abnormal   Collection Time: 10/13/23  6:30 PM  Result Value Ref Range   B Natriuretic Peptide >4,500.0 (H) 0.0 - 100.0 pg/mL    Comment: Performed at [facility], [address], [place], [place] [address]  Troponin I (High Sensitivity)     Status: Abnormal   Collection Time: 10/13/23  6:30 PM  Result Value Ref Range   Troponin I (High Sensitivity) 61 (H) <18 ng/L    Comment: (NOTE)  Elevated high sensitivity troponin I (hsTnI) values and significant  changes across serial measurements may suggest ACS but many other  chronic and acute conditions are known to elevate hsTnI results.  Refer to the "Links" section for chest pain algorithms and additional  guidance. Performed at [facility], [address] [place], [place] [address]   Troponin I (High Sensitivity)     Status: Abnormal   Collection Time: 10/13/23  8:48 PM  Result Value Ref Range   Troponin I (High Sensitivity) 60 (H) <18 ng/L    Comment: (NOTE) Elevated high sensitivity troponin I (hsTnI) values and significant  changes across serial measurements may suggest ACS but many other  chronic and acute conditions are known to elevate hsTnI results.  Refer to the "Links" section for chest pain algorithms and additional  guidance. Performed at [facility], [address], [place], [place] [address]   Urine rapid drug screen (hosp performed)     Status: Abnormal   Collection Time: 10/13/23 10:28 PM  Result Value Ref Range   Opiates NONE DETECTED NONE DETECTED   Cocaine NONE DETECTED NONE DETECTED   Benzodiazepines NONE DETECTED NONE DETECTED   Amphetamines NONE DETECTED NONE DETECTED   Tetrahydrocannabinol POSITIVE (A) NONE DETECTED   Barbiturates NONE DETECTED NONE DETECTED    Comment: (NOTE) DRUG SCREEN FOR MEDICAL PURPOSES ONLY.  IF CONFIRMATION IS NEEDED FOR ANY PURPOSE, NOTIFY LAB WITHIN 5 DAYS.  LOWEST DETECTABLE LIMITS FOR URINE DRUG SCREEN Drug Class                     Cutoff (ng/mL) Amphetamine and metabolites    1000 Barbiturate and metabolites    200 Benzodiazepine                 200 Opiates and metabolites         300 Cocaine and metabolites        300 THC                            50 Performed at [facility], [address], [place], [place] [address]   Urinalysis, Routine w reflex microscopic -Urine, Clean Catch     Status: None   Collection Time: 10/13/23 10:28 PM  Result Value Ref Range   Color, Urine YELLOW YELLOW   APPearance CLEAR CLEAR   Specific Gravity, Urine 1.008 1.005 - 1.030   pH 5.0 5.0 - 8.0   Glucose, UA NEGATIVE NEGATIVE mg/dL   Hgb urine dipstick NEGATIVE NEGATIVE   Bilirubin Urine NEGATIVE NEGATIVE   Ketones, ur NEGATIVE NEGATIVE mg/dL   Protein, ur NEGATIVE NEGATIVE mg/dL   Nitrite NEGATIVE NEGATIVE   Leukocytes,Ua NEGATIVE NEGATIVE    Comment: Performed at [facility], [address], [place], [place] [address]   DG Chest 2 View  Result Date: 10/13/2023 CLINICAL DATA:  Shortness of breath. EXAM: CHEST - 2 VIEW COMPARISON:  Chest radiograph dated 10/10/2023. FINDINGS: Cardiomegaly with mild central vascular congestion. No focal consolidation, pleural effusion, or pneumothorax. No acute osseous pathology. IMPRESSION: Cardiomegaly with mild central vascular congestion. Electronically Signed   By: Elgie Collard M.D.   On: 10/13/2023 19:39    Pending Labs Unresulted Labs (From admission, onward)     Start     Ordered   10/15/23 0500  Basic metabolic panel  Daily,   R     Comments: As Scheduled for  5 days    10/14/23 0100   10/14/23 0349  Blood gas, venous  Once,   R        10/14/23 0348   10/14/23 0050  HIV Antibody (routine testing w rflx)  (HIV Antibody (Routine testing w reflex) panel)  Once,   R        10/14/23 0100            Vitals/Pain Today's Vitals   10/13/23 2133 10/14/23 0049 10/14/23 0209 10/14/23 0404  BP: (!) 116/96 102/89 114/87   Pulse: (!) 105 94 (!) 102   Resp: 16 16  17   Temp: 97.7 F (36.5 C) 98.5 F (36.9 C)  (!) 97.4 F (36.3 C)  TempSrc: Oral Oral  Axillary  SpO2: 95% 98% 97%   Weight:       Height:      PainSc:        Isolation Precautions No active isolations  Medications Medications  sodium chloride flush (NS) 0.9 % injection 3 mL (3 mLs Intravenous Given 10/14/23 0202)  sodium chloride flush (NS) 0.9 % injection 3 mL (has no administration in time range)  0.9 %  sodium chloride infusion (has no administration in time range)  acetaminophen (TYLENOL) tablet 650 mg (has no administration in time range)  enoxaparin (LOVENOX) injection 40 mg (has no administration in time range)  LORazepam (ATIVAN) injection 0.5 mg (0.5 mg Intravenous Given 10/14/23 0234)  furosemide (LASIX) 200 mg in dextrose 5 % 100 mL (2 mg/mL) infusion (has no administration in time range)  atorvastatin (LIPITOR) tablet 40 mg (has no administration in time range)  aspirin EC tablet 81 mg (has no administration in time range)  famotidine (PEPCID) tablet 20 mg (has no administration in time range)  furosemide (LASIX) injection 60 mg (60 mg Intravenous Given 10/13/23 1943)  alum & mag hydroxide-simeth (MAALOX/MYLANTA) 200-200-20 MG/5ML suspension 30 mL (30 mLs Oral Given 10/13/23 2245)  furosemide (LASIX) injection 60 mg (60 mg Intravenous Given 10/14/23 0137)    Mobility walks     Focused Assessments    R Recommendations: See Admitting Provider Note  Report given to:   Additional Notes:

## 2023-10-14 NOTE — Plan of Care (Signed)

## 2023-10-14 NOTE — Progress Notes (Signed)
Pt seen, awake, watching tv, no increased wob /respiratory distress noted or voiced by patient at this time.  HR101, RR21-26, spo2 95% on 6l nasal cannula.  Bipap remains in room on standby but not indicated at this time.

## 2023-10-14 NOTE — ED Notes (Signed)
Pt wakes up periodically seemingly confused and pulls bipap off. Redirected, resituated in bed by this rn, another rn, and rt

## 2023-10-14 NOTE — Progress Notes (Signed)
       Overnight   NAME: Dale [name] MRN: [id number] DOB : [birth year]    Date of Service   10/14/2023   HPI/Events of Note    Notified by assigned RN for patient openly stating to her that he has "suicidal thoughts" currently.  Patient does not state any specific method or organized plan.  Patient is calm and conversational.  Remove all potentially dangerous/hazardous items as stated in [facility] Nursing protocol    Interventions/ Plan   SI sitter ordered. Consider Psych consult in AM  Continue all previous attending orders       Chinita Greenland BSN MSNA MSN ACNPC-AG Acute Care Nurse Practitioner [facility]

## 2023-10-14 NOTE — Assessment & Plan Note (Signed)
Started rescue BIPAP No evidence of hypercapnea on VBG.

## 2023-10-14 NOTE — Progress Notes (Signed)
Patient admitted earlier this morning.  H&P reviewed.  Patient seen and examined.  Patient mentions that he is feeling better.  He was a bit agitated earlier because he had to void urine and was unable to do it through the external catheter.  Nursing staff reports that he has not been compliant with his BiPAP.  Vital signs reviewed.  Currently saturating in the early 90s on 5 L of oxygen by nasal cannula.  Blood pressure 139/107, heart rate 108.  PVCs noted.  Diminished air entry bilaterally in the lungs.  Crackles are present.  No wheezing S1-S2 is normal regular with multiple premature beats Abdomen is soft.  Nontender nondistended. No focal neurological deficits.  VBG reviewed. Lactic acid level normal at 1.8.  Labs are pending from this morning.  Patient admitted with acute on chronic systolic CHF.  EF is known to be less than 20%.  Patient noted to be on furosemide infusion.  Cardiology has been consulted and will be await their input.  Acute respiratory failure hypoxia: Required BiPAP.  Noncompliant with same.  Currently on nasal cannula.  Other issues as per H&P.  Will follow-up on labs.  Osvaldo Shipper 10/14/2023

## 2023-10-14 NOTE — Progress Notes (Signed)
   10/14/23 0422  BiPAP/CPAP/SIPAP  BiPAP/CPAP/SIPAP Pt Type Adult  BiPAP/CPAP/SIPAP V60  Mask Type Full face mask  Mask Size Large  Set Rate 10 breaths/min  Respiratory Rate 22 breaths/min  IPAP 10 cmH20  EPAP 5 cmH2O  FiO2 (%) 30 %  Minute Ventilation 15  Leak 00  Peak Inspiratory Pressure (PIP) 10  Tidal Volume (Vt) 742  Patient Home Equipment No  Auto Titrate No  Press High Alarm 30 cmH2O  Press Low Alarm 5 cmH2O  Nasal massage performed Yes  CPAP/SIPAP surface wiped down Yes  Oxygen Percent 30 %   PT. Frequently removing BiPAP V60 while in ED-9, has 3 lpm n/c on at bedside, if needed, RT to monitor.

## 2023-10-14 NOTE — Progress Notes (Signed)
Heart Failure Navigator Progress Note  Assessed for Heart & Vascular TOC clinic readiness.  Patient does not meet criteria due to Advanced Heart Failure Team patient.   Navigator will sign off at this time.    Kourtnie Sachs, BSN, RN Heart Failure Nurse Navigator Secure Chat Only   

## 2023-10-14 NOTE — Plan of Care (Signed)
PT continues to refuse medical interventions needed for improving health for safe transfer/discharge. Palliative consult placed. Will continue to assess for changes in patient status/condition.

## 2023-10-14 NOTE — Progress Notes (Signed)
   10/14/23 0500  BiPAP/CPAP/SIPAP  BiPAP/CPAP/SIPAP Pt Type Adult  BiPAP/CPAP/SIPAP V60  Mask Type Full face mask  Mask Size Large  Set Rate 11 breaths/min  Respiratory Rate 16 breaths/min  IPAP 10 cmH20  EPAP 5 cmH2O  FiO2 (%) 30 %  Minute Ventilation 16  Leak 00  Peak Inspiratory Pressure (PIP) 10  Tidal Volume (Vt) 535  Patient Home Equipment No  Auto Titrate No  Press High Alarm 30 cmH2O  Press Low Alarm 5 cmH2O  Oxygen Percent 30 %  BiPAP/CPAP /SiPAP Vitals  Pulse Rate 98  Resp (!) 35  BP 108/86  SpO2 98 %  MEWS Score/Color  MEWS Score 2  MEWS Score Color Yellow   Pt. Placed back on BIPAP V60 after arriving [facility] ICU[facility] from [facility].

## 2023-10-14 NOTE — H&P (Signed)
History and Physical    Patient: Dale [name] WJX:[id number] DOB: 1961/07/16 DOA: 10/13/2023 DOS: the patient was seen and examined on 10/14/2023 PCP: Pcp, No  Patient coming from: Home  Chief Complaint:  Chief Complaint  Patient presents with   Shortness of Breath   Leg Swelling   Suicidal   HPI: Dale [name] is a 62 y.o. male with medical history significant of HFrEF (EF < 20%) in setting of ICM with chronically occluded RCA as well as prior cocaine abuse and EtOH abuse.  Pt with increased peripheral edema, DOE recently.  Gets SOB just walking across a room.  BLE edema worse than baseline.  Not helped by home diuretics.  Follows with [facility] CHF clinic, they recd increasing torsemide and adding zaroxolyn today.  Despite this pt in to ED with worsening volume overload, SOB.    Review of Systems: As mentioned in the history of present illness. All other systems reviewed and are negative. Past Medical History:  Diagnosis Date   Alcohol abuse    CAD (coronary artery disease)    a. 05/2019 Cath: LM nl, LAD 15p, 81m, LCX large/nl, OM2 30, OM3 20, RCA 100 CTO w/ bridging L->R collats to RPDA.   Chronic combined systolic and diastolic CHF (congestive heart failure) (HCC)    a. 09/27/18 Echo: EF 20-25%, grade 2 DD; b. 03/2019 Echo: EF 20-25%; c. 05/2021 Echo: EF <10%, glob HK. GrII DD. Sev red RV fxn. Sev BAE. Mild MR. Mild-mod TR.   CKD (chronic kidney disease), stage II    Cocaine abuse (HCC)    Hyperlipidemia LDL goal <70    Hypertension    Ischemic cardiomyopathy    a. 09/27/18 Echo: EF 20-25%, grade 2 DD; b. 03/2019 Echo: EF 20-25%; c. 05/2019 Cath: RCA 100 CTA, otw nonobs dzs; d. 05/2021 Echo: EF <10%, glob HK. GrII DD. Sev red RV fxn.   Morbid obesity (HCC)    Noncompliance    Normocytic anemia    Past Surgical History:  Procedure Laterality Date   RIGHT/LEFT HEART CATH AND CORONARY ANGIOGRAPHY N/A 05/10/2019   Procedure: RIGHT/LEFT HEART CATH AND CORONARY ANGIOGRAPHY;   Surgeon: Yvonne Kendall, MD;  Location: [facility] INVASIVE CV LAB;  Service: Cardiovascular;  Laterality: N/A;   RIGHT/LEFT HEART CATH AND CORONARY ANGIOGRAPHY N/A 03/18/2022   Procedure: RIGHT/LEFT HEART CATH AND CORONARY ANGIOGRAPHY;  Surgeon: Laurey Morale, MD;  Location: [facility] INVASIVE CV LAB;  Service: Cardiovascular;  Laterality: N/A;   Social History:  reports that he has never smoked. He has never used smokeless tobacco. He reports current alcohol use. He reports that he does not currently use drugs after having used the following drugs: Marijuana and Cocaine. Frequency: 3.00 times per week.  Allergies  Allergen Reactions   Hydrocodone Itching    Family History  Problem Relation Age of Onset   Hypertension Maternal Grandmother     Prior to Admission medications   Medication Sig Start Date End Date Taking? Authorizing Provider  atorvastatin (LIPITOR) 40 MG tablet Take 1 tablet (40 mg total) by mouth daily. 06/04/23  Yes Laurey Morale, MD  dapagliflozin propanediol (FARXIGA) 10 MG TABS tablet Take 1 tablet (10 mg total) by mouth daily. 06/04/23  Yes Laurey Morale, MD  digoxin (LANOXIN) 0.125 MG tablet Take 0.5 tablets (0.0625 mg total) by mouth daily. 06/04/23  Yes Laurey Morale, MD  famotidine (PEPCID) 20 MG tablet Take 20 mg by mouth 2 (two) times daily. 08/13/23  Yes [provider]  potassium chloride SA (KLOR-CON M) 20 MEQ tablet Take 40 mEq by mouth 2 (two) times daily.   Yes [provider]  sildenafil (VIAGRA) 50 MG tablet Take 1 tablet (50 mg total) by mouth daily as needed for erectile dysfunction. 06/04/23  Yes Laurey Morale, MD  spironolactone (ALDACTONE) 25 MG tablet Take 0.5 tablets (12.5 mg total) by mouth daily. 06/04/23 05/29/24 Yes Laurey Morale, MD  torsemide (DEMADEX) 20 MG tablet Take 20 mg by mouth daily.   Yes [provider]  aspirin EC 81 MG tablet Take 1 tablet (81 mg total) by mouth daily. Swallow whole. 06/04/23   Laurey Morale, MD  metolazone (ZAROXOLYN) 2.5 MG tablet Take 1 tablet (2.5 mg total) by mouth daily for 2 days. With additional 40 meq of potassium 10/13/23 10/15/23  Milford, Anderson Malta, FNP  pantoprazole (PROTONIX) 40 MG tablet Take 1 tablet (40 mg total) by mouth daily. Patient not taking: Reported on 10/13/2023 02/19/23   Mapp, Gaylyn Cheers, MD  potassium chloride SA (KLOR-CON M) 20 MEQ tablet Take 3 tablets (60 mEq total) by mouth 2 (two) times daily. With additional with metolazone 10/13/23   Jacklynn Ganong, FNP  torsemide (DEMADEX) 20 MG tablet Take 4 tablets (80 mg total) by mouth daily. 10/13/23   Jacklynn Ganong, FNP    Physical Exam: Vitals:   10/13/23 2133 10/14/23 0049 10/14/23 0209 10/14/23 0404  BP: (!) 116/96 102/89 114/87   Pulse: (!) 105 94 (!) 102   Resp: 16 16  17   Temp: 97.7 F (36.5 C) 98.5 F (36.9 C)  (!) 97.4 F (36.3 C)  TempSrc: Oral Oral  Axillary  SpO2: 95% 98% 97%   Weight:      Height:       Constitutional: NAD, calm, comfortable Respiratory: Bibasilar crackles Cardiovascular: Regular rate and rhythm, no murmurs / rubs / gallops. Pt with anasarca. 2+ pedal pulses. No carotid bruits.  Abdomen: no tenderness, no masses palpated. No hepatosplenomegaly. Bowel sounds positive.  Neurologic: CN 2-12 grossly intact. Sensation intact, DTR normal. Strength 5/5 in all 4.  Psychiatric: Normal judgment and insight. Alert and oriented x 3. Normal mood.   Data Reviewed:    Labs on Admission: I have personally reviewed following labs and imaging studies  CBC: Recent Labs  Lab 10/10/23 0249 10/13/23 1830  WBC 6.1 6.6  NEUTROABS 4.3 4.9  HGB 13.6 13.6  HCT 41.7 41.6  MCV 93.5 93.7  PLT 264 284   Basic Metabolic Panel: Recent Labs  Lab 10/10/23 0249 10/13/23 1830  NA 137 137  K 3.6 3.5  CL 101 101  CO2 25 22  GLUCOSE 101* 98  BUN 41* 56*  CREATININE 2.09* 2.11*  CALCIUM 9.3 9.2   GFR: Estimated Creatinine Clearance: 46.3 mL/min (A) (by C-G formula based  on SCr of 2.11 mg/dL (H)). Liver Function Tests: Recent Labs  Lab 10/13/23 1830  AST 28  ALT 17  ALKPHOS 91  BILITOT 1.6*  PROT 7.6  ALBUMIN 3.9   No results for input(s): "LIPASE", "AMYLASE" in the last 168 hours. No results for input(s): "AMMONIA" in the last 168 hours. Coagulation Profile: No results for input(s): "INR", "PROTIME" in the last 168 hours. Cardiac Enzymes: No results for input(s): "CKTOTAL", "CKMB", "CKMBINDEX", "TROPONINI" in the last 168 hours. BNP (last 3 results) No results for input(s): "PROBNP" in the last 8760 hours. HbA1C: No results for input(s): "HGBA1C" in the last 72 hours. CBG: No results for input(s): "  GLUCAP" in the last 168 hours. Lipid Profile: No results for input(s): "CHOL", "HDL", "LDLCALC", "TRIG", "CHOLHDL", "LDLDIRECT" in the last 72 hours. Thyroid Function Tests: No results for input(s): "TSH", "T4TOTAL", "FREET4", "T3FREE", "THYROIDAB" in the last 72 hours. Anemia Panel: No results for input(s): "VITAMINB12", "FOLATE", "FERRITIN", "TIBC", "IRON", "RETICCTPCT" in the last 72 hours. Urine analysis:    Component Value Date/Time   COLORURINE YELLOW 10/13/2023 2228   APPEARANCEUR CLEAR 10/13/2023 2228   LABSPEC 1.008 10/13/2023 2228   PHURINE 5.0 10/13/2023 2228   GLUCOSEU NEGATIVE 10/13/2023 2228   HGBUR NEGATIVE 10/13/2023 2228   BILIRUBINUR NEGATIVE 10/13/2023 2228   KETONESUR NEGATIVE 10/13/2023 2228   PROTEINUR NEGATIVE 10/13/2023 2228   UROBILINOGEN 0.2 03/11/2016 0934   NITRITE NEGATIVE 10/13/2023 2228   LEUKOCYTESUR NEGATIVE 10/13/2023 2228    Radiological Exams on Admission: DG Chest 2 View  Result Date: 10/13/2023 CLINICAL DATA:  Shortness of breath. EXAM: CHEST - 2 VIEW COMPARISON:  Chest radiograph dated 10/10/2023. FINDINGS: Cardiomegaly with mild central vascular congestion. No focal consolidation, pleural effusion, or pneumothorax. No acute osseous pathology. IMPRESSION: Cardiomegaly with mild central vascular  congestion. Electronically Signed   By: Elgie Collard M.D.   On: 10/13/2023 19:39    EKG: Independently reviewed.   Assessment and Plan: * Acute on chronic HFrEF (heart failure with reduced ejection fraction) (HCC) Pt with anasarca, pulm edema on CXR, BNP > 4500, trop of 60 and flat. Got 120mg  lasix in ED total (60 then 60 a couple hours later).  UOP of just 200cc. Pt started on BIPAP for worsening SOB D/w cards on call Dr. Hulan Saas: Lasix gtt ordered at 20mg  / hr Also ordering 250mg  IV diuril (instead of zaroxolyn since he's on BIPAP now) Cards to see in consult in AM BMP daily Tele monitor Strict intake and output Will defer to cards if they want to get repeat 2d echo. I'm going to hold his digoxin and Jardiance in setting of AKI on top of CKD today. BP looks too soft to try NTG  Acute respiratory failure with hypoxia (HCC) Started rescue BIPAP No evidence of hypercapnea on VBG.  Acute kidney injury superimposed on chronic kidney disease (HCC) Creat 2.11 today up some from his baseline of ~1.5 it looks like. Monitor closely with daily BMP Bladder scan neg Only 200cc uop with lasix thus far. May need to get renal US if persists  Alcohol use disorder, moderate, in early remission (HCC) BAL of just 13 today, almost neg but not quite. Watch for signs / symptoms of withdrawal while here. Will order thiamine.      Advance Care Planning:   Code Status: Full Code  Consults: Cards consulted, d/w Dr. Hulan Saas, also message sent to P. Trent  Family Communication: No family in room  Severity of Illness: The appropriate patient status for this patient is OBSERVATION. Observation status is judged to be reasonable and necessary in order to provide the required intensity of service to ensure the patient's safety. The patient's presenting symptoms, physical exam findings, and initial radiographic and laboratory data in the context of their medical condition is felt to place them at  decreased risk for further clinical deterioration. Furthermore, it is anticipated that the patient will be medically stable for discharge from the hospital within 2 midnights of admission.   Author: Hillary Bow., DO 10/14/2023 4:24 AM  For on call review [email or web address].

## 2023-10-14 NOTE — Consult Note (Addendum)
Cardiology Consultation   Patient ID: Dale [name] MRN: [id number]; DOB: Mar 14, 1961  Admit date: 10/13/2023 Date of Consult: 10/14/2023  PCP:  Aviva Kluver   [facility] Providers Cardiologist:  Marca Ancona, MD   {   Patient Profile:   Dale [name] is a 62 y.o. male with a hx of HTN, polysubstance abuse with cocaine, GERD, ETOH abuse, mixed NICM/ICM, and chronic HFrEF, CAD, CKD stage III, hyperlipidemia, who is being seen 10/14/2023 for the evaluation of acute HFrEF at the request of Dr Rito Ehrlich.  History of Present Illness:   Dale [name] with above PMH presented to ER for SOB and leg edema. He states over the past 3 weeks he had been feeling poor. He reports increased SOB with exertion and now at rest. He noted significant edema of BLE. He has been taking torsemide 80mg  daily at home, felt urine output is low. He drinks more than 1L fluid sometimes, denied high salt intake. He has been taking all his CHF meds including digoxin, spironolactone, and farxiga. He felt difficult to stop drinking alcohol. He has not used any cocaine. He is quite labored with breathing at rest, but has refused BIPAP here, SaO2 dropped to 70% when he removed [place] oxygen, currently on 5LNC. He wants to eat. He has Net- 1.3L so far.    He last visited ER 10/10/2023 for increased lower extremity edema and severe shortness of breath, was advised to increase torsemide to 40 mg a.m. and 20 mg p.m. for 3 days due to CHF decompensation.  He actually took torsemide 40 mg twice daily at home, reports no improvement of symptoms and call the AHF clinic yesterday.  He was offered add-on appointment for 10/13/2023 at [facility] clinic but declined due to lack of transportation.  He ultimately went to the ER at [facility] yesterday.    Per chart review, he had chronic systolic heart failure with LVEF severely reduced dating back to 2019.  He has required multiple admission for CHF exacerbation historically due to medication  noncompliance.    Echo from 02/2022 with LVEF 10 to 15%, global hypokinesis, severe LV dilatation, severely enlarged RV with severely reduced systolic function, severe BAE, moderate MR, dilated IVC.   Most recent R/LHC (5/23): occluded pRCA w/ collaterals, nonobstructive disease in the left system, markedly elevated filling pressures, pulmonary venous hypertension, low CO.RA mean 23, RV 50/21, PA 53/32, mean 40, PCWP mean 39, LV 117/35, AO 117/82, CO/CI (Fick): 3.5/1.53, PVR < 1 WU, Oxygen saturations: PA 47%, AO 97%.  He was recommended medical management for CAD with aspirin 81 mg daily and Lipitor 40 mg daily.  Discharged by heart failure paramedic medicine 12/13/2022 due to ongoing EtOH abuse, medical noncompliance and difficulty contacting him.   Hospitalized 02/2023 for acute CHF due to noncompliance with medications.  Added on Protonix for GERD.  Follow-up visit revealing ReDS 41%, torsemide increased and spironolactone 12.5 mg started.  He canceled 1 week follow-up after that and no-show for the next.   Echo 05/2023 revealing LVEF less than 20%, no LV thrombus, moderate decreased RV systolic function.   Last followed up with AHF clinic on 09/03/2023, feeling overall well, no shortness of breath with ADLs or walking, + occasional dizziness, + ankle swelling, reports taking all medication, continue to drink beer throughout the week and lots of fluids.  He has stopped using cocaine for 2 years.  ReDS was 37%.  He was advised to increase torsemide to 80 mg twice daily, added  metolazone 2.5 mg and additional potassium supplement that day.  He was advised to continue GDMT with digoxin 0.0625 milligram daily, spironolactone 12.5 mg daily, Farxiga 10 mg daily, with repeating BMP in 10 to 14 days.  Noted he was intolerant to [facility] due to hypotension/AKI and tolerant to BiDil due to hypotension.  He was felt not a candidate for ICD unless he can practice alcohol abstinence.  He was felt not a candidate for  advanced therapy due to ongoing substance abuse, poor medical compliance, and poor living situation (homeless/living with friends or in hotels).  He was advised to follow-up in 3 to 4 weeks, but never showed up on 09/30/2023 appointment.      Past Medical History:  Diagnosis Date   Alcohol abuse    CAD (coronary artery disease)    a. 05/2019 Cath: LM nl, LAD 15p, 35m, LCX large/nl, OM2 30, OM3 20, RCA 100 CTO w/ bridging L->R collats to RPDA.   Chronic combined systolic and diastolic CHF (congestive heart failure) (HCC)    a. 09/27/18 Echo: EF 20-25%, grade 2 DD; b. 03/2019 Echo: EF 20-25%; c. 05/2021 Echo: EF <10%, glob HK. GrII DD. Sev red RV fxn. Sev BAE. Mild MR. Mild-mod TR.   CKD (chronic kidney disease), stage II    Cocaine abuse (HCC)    Hyperlipidemia LDL goal <70    Hypertension    Ischemic cardiomyopathy    a. 09/27/18 Echo: EF 20-25%, grade 2 DD; b. 03/2019 Echo: EF 20-25%; c. 05/2019 Cath: RCA 100 CTA, otw nonobs dzs; d. 05/2021 Echo: EF <10%, glob HK. GrII DD. Sev red RV fxn.   Morbid obesity (HCC)    Noncompliance    Normocytic anemia     Past Surgical History:  Procedure Laterality Date   RIGHT/LEFT HEART CATH AND CORONARY ANGIOGRAPHY N/A 05/10/2019   Procedure: RIGHT/LEFT HEART CATH AND CORONARY ANGIOGRAPHY;  Surgeon: Yvonne Kendall, MD;  Location: [facility] INVASIVE CV LAB;  Service: Cardiovascular;  Laterality: N/A;   RIGHT/LEFT HEART CATH AND CORONARY ANGIOGRAPHY N/A 03/18/2022   Procedure: RIGHT/LEFT HEART CATH AND CORONARY ANGIOGRAPHY;  Surgeon: Laurey Morale, MD;  Location: [facility] INVASIVE CV LAB;  Service: Cardiovascular;  Laterality: N/A;     Home Medications:  Prior to Admission medications   Medication Sig Start Date End Date Taking? Authorizing Provider  atorvastatin (LIPITOR) 40 MG tablet Take 1 tablet (40 mg total) by mouth daily. 06/04/23  Yes Laurey Morale, MD  dapagliflozin propanediol (FARXIGA) 10 MG TABS tablet Take 1 tablet (10 mg total) by mouth daily.  06/04/23  Yes Laurey Morale, MD  digoxin (LANOXIN) 0.125 MG tablet Take 0.5 tablets (0.0625 mg total) by mouth daily. 06/04/23  Yes Laurey Morale, MD  famotidine (PEPCID) 20 MG tablet Take 20 mg by mouth 2 (two) times daily. 08/13/23  Yes [provider]  potassium chloride SA (KLOR-CON M) 20 MEQ tablet Take 40 mEq by mouth 2 (two) times daily.   Yes [provider]  sildenafil (VIAGRA) 50 MG tablet Take 1 tablet (50 mg total) by mouth daily as needed for erectile dysfunction. 06/04/23  Yes Laurey Morale, MD  spironolactone (ALDACTONE) 25 MG tablet Take 0.5 tablets (12.5 mg total) by mouth daily. 06/04/23 05/29/24 Yes Laurey Morale, MD  torsemide (DEMADEX) 20 MG tablet Take 20 mg by mouth daily.   Yes [provider]  aspirin EC 81 MG tablet Take 1 tablet (81 mg total) by mouth daily. Swallow whole. 06/04/23  Laurey Morale, MD  metolazone (ZAROXOLYN) 2.5 MG tablet Take 1 tablet (2.5 mg total) by mouth daily for 2 days. With additional 40 meq of potassium 10/13/23 10/15/23  Milford, Anderson Malta, FNP  pantoprazole (PROTONIX) 40 MG tablet Take 1 tablet (40 mg total) by mouth daily. Patient not taking: Reported on 10/13/2023 02/19/23   Mapp, Gaylyn Cheers, MD  potassium chloride SA (KLOR-CON M) 20 MEQ tablet Take 3 tablets (60 mEq total) by mouth 2 (two) times daily. With additional with metolazone 10/13/23   Jacklynn Ganong, FNP  torsemide (DEMADEX) 20 MG tablet Take 4 tablets (80 mg total) by mouth daily. 10/13/23   Jacklynn Ganong, FNP    Inpatient Medications: Scheduled Meds:  aspirin EC  81 mg Oral Daily   atorvastatin  40 mg Oral Daily   Chlorhexidine Gluconate Cloth  6 each Topical Q0600   enoxaparin (LOVENOX) injection  40 mg Subcutaneous Q24H   famotidine  20 mg Oral BID   potassium chloride  40 mEq Oral Once   sodium chloride flush  3 mL Intravenous Q12H   thiamine (VITAMIN B1) injection  100 mg Intravenous Q24H   Continuous Infusions:  sodium  chloride     furosemide (LASIX) 200 mg in dextrose 5 % 100 mL (2 mg/mL) infusion 20 mg/hr (10/14/23 0907)   PRN Meds: sodium chloride, acetaminophen, LORazepam, mouth rinse, sodium chloride flush  Allergies:    Allergies  Allergen Reactions   Hydrocodone Itching    Social History:   Social History   Socioeconomic History   Marital status: Single    Spouse name: none   Number of children: Not on file   Years of education: Not on file   Highest education level: Not on file  Occupational History   Occupation: Disabled  Tobacco Use   Smoking status: Never   Smokeless tobacco: Never  Vaping Use   Vaping status: Never Used  Substance and Sexual Activity   Alcohol use: Yes    Comment: 1 quarts a week   Drug use: Not Currently    Frequency: 3.0 times per week    Types: Marijuana, Cocaine   Sexual activity: Not Currently  Other Topics Concern   Not on file  Social History Narrative   Lives w/ mother in [place] but recently visiting w/ dtr in [place].   Social Determinants of Health   Financial Resource Strain: High Risk (08/25/2023)   Overall Financial Resource Strain (CARDIA)    Difficulty of Paying Living Expenses: Hard  Food Insecurity: Patient Unable To Answer (10/14/2023)   Hunger Vital Sign    Worried About Running Out of Food in the Last Year: Patient unable to answer    Ran Out of Food in the Last Year: Patient unable to answer  Recent Concern: Food Insecurity - Food Insecurity Present (07/21/2023)   Hunger Vital Sign    Worried About Programme researcher, broadcasting/film/video in the Last Year: Often true    Ran Out of Food in the Last Year: Often true  Transportation Needs: Unmet Transportation Needs (10/14/2023)   PRAPARE - Administrator, Civil Service (Medical): Patient unable to answer    Lack of Transportation (Non-Medical): Yes  Physical Activity: Not on file  Stress: Not on file  Social Connections: Not on file  Intimate Partner Violence: Unknown (10/14/2023)    Humiliation, Afraid, Rape, and Kick questionnaire    Fear of Current or Ex-Partner: Patient unable to answer    Emotionally Abused: No  Physically Abused: No    Sexually Abused: No    Family History:    Family History  Problem Relation Age of Onset   Hypertension Maternal Grandmother      ROS:  Constitutional: Denied fever, chills, malaise, night sweats Eyes: Denied vision change or loss Ears/Nose/Mouth/Throat: Denied ear ache, sore throat, coughing, sinus pain Cardiovascular: see HPI Respiratory: see HPI  Gastrointestinal: Denied nausea, vomiting, abdominal pain, diarrhea Genital/Urinary: Denied dysuria, hematuria, urinary frequency/urgency Musculoskeletal: Denied muscle ache, joint pain, weakness Skin: Denied rash, wound Neuro: Denied headache, dizziness, syncope Psych: Denied history of depression/anxiety  Endocrine: Denied history of diabetes   Physical Exam/Data:   Vitals:   10/14/23 1030 10/14/23 1045 10/14/23 1048 10/14/23 1100  BP: (!) 154/109 (!) 69/41 121/85 119/84  Pulse: 85 82 90 97  Resp: (!) 31 (!) 29 (!) 23 (!) 27  Temp:      TempSrc:      SpO2: 96% 99% 98% 96%  Weight:      Height:        Intake/Output Summary (Last 24 hours) at 10/14/2023 1134 Last data filed at 10/14/2023 1026 Gross per 24 hour  Intake 100.58 ml  Output 1440 ml  Net -1339.42 ml      10/14/2023    4:45 AM 10/14/2023   12:50 AM 10/13/2023    6:21 PM  Last 3 Weights  Weight (lbs) 252 lb 3.3 oz 252 lb 3.3 oz 235 lb 14.3 oz  Weight (kg) 114.4 kg 114.4 kg 107 kg     Body mass index is 35.18 kg/m.   Vitals:  Vitals:   10/14/23 1048 10/14/23 1100  BP: 121/85 119/84  Pulse: 90 97  Resp: (!) 23 (!) 27  Temp:    SpO2: 98% 96%   General Appearance: In no apparent distress, laying in bed HEENT: Normocephalic, atraumatic.  Neck: Supple, trachea midline, JVD elevated to jaw with HOB at 90 degree  Cardiovascular: Regular rate and rhythm, normal S1-S2,  no  murmur Respiratory: Resting breathing mildly labored, lungs sounds diminished bilaterally, on 5LNC oxygen  Gastrointestinal: Bowel sounds positive, abdomen soft, non-tender Extremities: Able to move all extremities in bed without difficulty, 2-3 + pitting edema of BLE Musculoskeletal: Normal muscle bulk and tone Skin: Extremity cool to touch  Neurologic: Alert, oriented to person, place and time.  no gross focal neuro deficit Psychiatric: Normal affect. Mood is appropriate.    EKG:  The EKG was personally reviewed and demonstrates:    EKG today showed sinus rhythm 98 bpm, first degree AVB, PVC, non-specific IVCD, manual QT 460 msec   Telemetry:  Telemetry was personally reviewed and demonstrates:    Sinus rhythm 80-90s, occasional PVCs   Relevant CV Studies:   Echo from 06/04/23:    1. Left ventricular ejection fraction, by estimation, is <20%. The left  ventricle has severely decreased function. The left ventricle demonstrates  global hypokinesis. The left ventricular internal cavity size was severely  dilated. Left ventricular  diastolic parameters are consistent with Grade II diastolic dysfunction  (pseudonormalization). No LV thrombus noted.   2. Right ventricular systolic function is moderately reduced. The right  ventricular size is mildly enlarged. Tricuspid regurgitation signal is  inadequate for assessing PA pressure.   3. Left atrial size was moderately dilated.   4. Right atrial size was severely dilated.   5. The mitral valve is normal in structure. Trivial mitral valve  regurgitation. No evidence of mitral stenosis.   6. The aortic valve is tricuspid.  Aortic valve regurgitation is trivial.   7. The inferior vena cava is normal in size with <50% respiratory  variability, suggesting right atrial pressure of 8 mmHg.   Laboratory Data:  High Sensitivity Troponin:   Recent Labs  Lab 10/13/23 1830 10/13/23 2048  TROPONINIHS 61* 60*     Chemistry Recent Labs   Lab 10/10/23 0249 10/13/23 1830 10/14/23 0924  NA 137 137 138  K 3.6 3.5 3.3*  CL 101 101 100  CO2 25 22 23   GLUCOSE 101* 98 117*  BUN 41* 56* 64*  CREATININE 2.09* 2.11* 2.11*  CALCIUM 9.3 9.2 9.3  GFRNONAA 36* 35* 35*  ANIONGAP 11 14 15     Recent Labs  Lab 10/13/23 1830  PROT 7.6  ALBUMIN 3.9  AST 28  ALT 17  ALKPHOS 91  BILITOT 1.6*   Lipids No results for input(s): "CHOL", "TRIG", "HDL", "LABVLDL", "LDLCALC", "CHOLHDL" in the last 168 hours.  Hematology Recent Labs  Lab 10/10/23 0249 10/13/23 1830  WBC 6.1 6.6  RBC 4.46 4.44  HGB 13.6 13.6  HCT 41.7 41.6  MCV 93.5 93.7  MCH 30.5 30.6  MCHC 32.6 32.7  RDW 15.2 15.3  PLT 264 284   Thyroid No results for input(s): "TSH", "FREET4" in the last 168 hours.  BNP Recent Labs  Lab 10/10/23 0249 10/13/23 1830  BNP 3,459.8* >4,500.0*    DDimer No results for input(s): "DDIMER" in the last 168 hours.   Radiology/Studies:  [place] EKG SITE RITE  Result Date: 10/14/2023 If Site Rite image not attached, placement could not be confirmed due to current cardiac rhythm.  DG Chest 2 View  Result Date: 10/13/2023 CLINICAL DATA:  Shortness of breath. EXAM: CHEST - 2 VIEW COMPARISON:  Chest radiograph dated 10/10/2023. FINDINGS: Cardiomegaly with mild central vascular congestion. No focal consolidation, pleural effusion, or pneumothorax. No acute osseous pathology. IMPRESSION: Cardiomegaly with mild central vascular congestion. Electronically Signed   By: Elgie Collard M.D.   On: 10/13/2023 19:39     Assessment and Plan:   Acute on chronic biventricular heart failure  Mixed etiology cardiomyopathy Acute hypoxic respiratory failure  AKI on CKD III - presented with increased SOB at rest, leg edema for 3 weeks, reports taking torsemide 80mg  daily with low UOP and was complaint with meds at home; non-compliant with fluid intake and still drinks ETOH   - RHC in 5/23 showed low output with CI 1.53 and markedly elevated  filling pressures. - Echo 05/2023 showed EF < 20%, no LV thrombus, moderately decreased RV systolic function.  - clinically hypervolemic, suspect for low output heart failure  - lactic acid WNL x1, will request urgent PICC line insertion and check COOX - on PTA torsemide 80mg  daily with poor UOP, continue IV Lasix gtt, UOP seems adequate so far, continue diuresis and monitor I&O, daily weight - close monitor respiratory status, refusing BIPAP, consider goals of care discussion  - GDMT: labs with AKI Cr 2.09 >2.11, was 1.46 on 09/15/23, suspect CRS, hold PTA spironolactone, farxiga, and digoxin due to AKI; no BB due to low output state; historically not tolerating entresto and bidil  - reviewed with AHF team today, options is limited for advanced therapy as he is continued with ETOH/THC abuse, non-compliance, and homeless; Dr [name] will evaluate him later today  - consider palliative care consult and goals of care discussion   CAD - LHC in 7/20 and 5/23 showed CTO of RCA. Medically managed with ASA 81mg  and lipitor 40mg   ETOH abuse - active, recommend cessation   Hx of cocaine abuse - stopped for 2 years      Risk Assessment/Risk Scores:   New York Heart Association (NYHA) Functional Class NYHA Class IV        For questions or updates, please contact [facility] Please consult [email or web address] for contact info under    Signed, Cyndi Bender, NP  10/14/2023 11:34 AM  Patient seen with NP, agree with the above note.   Mr [name] has a long history of nonischemic cardiomyopathy with end stage HF due to mixed ischemic/nonischemic cardiomyopathy.  He has a history of cocaine abuse which he reports having stopped for over a year.  However, he still drinks ETOH heavily.  He is functionally homeless (lives with friends and in hotels).  He says that he has been taking his medications recently but celebrated a lot over Thanksgiving, drinking beer and excess fluid as well as marijuana.   He became increasingly short of breath with increased peripheral edema over the last couple of weeks despite compliance with home regimen.  GDMT has been limited due to low BP and CKD stage 3.   At admission, BNP > 4500 and he was hypoxemic initially requiring Bipap, now on nasal cannula.  He was started on Lasix gtt 20 mg/hr overnight and given a dose of Diuril. Creatinine was higher than baseline 1.5 at 2.1.  Lactate was normal.   General: NAD Neck: JVP 14-16 cm, no thyromegaly or thyroid nodule.  Lungs: Clear to auscultation bilaterally with normal respiratory effort. CV: Lateral PMI.  Heart regular S1/S2, no S3/S4, no murmur.  1+ edema to thighs.  No carotid bruit.  Normal pedal pulses.  Abdomen: Soft, nontender, no hepatosplenomegaly, no distention.  Skin: Intact without lesions or rashes.  Neurologic: Alert and oriented x 3.  Psych: Normal affect. Extremities: No clubbing or cyanosis.  HEENT: Normal.   1. Acute on chronic systolic CHF: Mixed ischemic/nonischemic cardiomyopathy. He has a history of alcohol and cocaine abuse, which likely contributes to cardiomyopathy.  LHC in 7/20 and again in 5/23 showed RCA chronic occlusion. RHC in 5/23 showed low output with CI 1.53 and markedly elevated filling pressures.  Most recent echo in 7/24 showed EF < 20%, no LV thrombus, moderately decreased RV systolic function. He is readmitted with marked volume overload, hypoxemia, and NYHA class IIIb-IV symptoms.  Creatinine up to 2.1 though lactate normal at 1.8 initially.  Based on past evaluation, suspect low output HF.  - Place PICC to follow CVP and co-ox.  - I will start him on milrinone 0.25 mcg/kg/min to aide with diuresis.  He is not a candidate for home milrinone with substance abuse and functional homelessness.  - Hold digoxin for now with rise in creatinine.  - Can continue spironolactone 12.5 mg daily.  - Hold Farxiga for now.  - Off Entresto due to hypotension and AKI in past.  - Off BiDil  with low BP. - Not candidate for ICD unless he can cut back on ETOH abuse.  He has stopped cocaine. Narrow QRS so no CRT.  - Not an advanced therapies candidate with substance abuse, poor compliance, and living situation (basically homeless, lives with friends or in hotels). Would also be poor home milrinone candidate.  We discussed end stage HF diagnosis today, says he "just wants to get the fluid off" and not ready to commit to DNR.  Will make palliative care referral.  2. CAD: LHC in 7/20 and 5/23 showed  CTO of RCA treated medically, left system ok. Suspect cardiomyopathy is primarily nonischemic. No chest pain. - Continue ASA 81 daily + atorvastatin 40 mg daily.  3. Cocaine Use Disorder: Has been off cocaine for > 2 years now.  4. Alcohol Use Disorder: Still drinking heavily at times, encouraged to continue to cut back even more 5. AKI on CKD stage 3: Baseline SCr 1.8, now up to 2.1, suspect cardiorenal syndrome in setting of end stage HF.   - Support CO with milrinone.  - Continue diuresis.   OK to stay at [facility], ok to eat.  No plan for cardiac procedures.   Marca Ancona 10/14/2023 12:55 PM

## 2023-10-14 NOTE — Progress Notes (Signed)
Per RN via secure chat, patient refusing PICC placement. Will follow up tomorrow 12/11.

## 2023-10-14 NOTE — Assessment & Plan Note (Addendum)
Pt with anasarca, pulm edema on CXR, BNP > 4500, trop of 60 and flat. Got 120mg  lasix in ED total (60 then 60 a couple hours later).  UOP of just 200cc. Pt started on BIPAP for worsening SOB D/w cards on call Dr. Hulan Saas: Lasix gtt ordered at 20mg  / hr Also ordering 250mg  IV diuril (instead of zaroxolyn since he's on BIPAP now) Cards to see in consult in AM BMP daily Tele monitor Strict intake and output Will defer to cards if they want to get repeat 2d echo. I'm going to hold his digoxin and Jardiance in setting of AKI on top of CKD today. BP looks too soft to try NTG

## 2023-10-14 NOTE — Assessment & Plan Note (Signed)
Creat 2.11 today up some from his baseline of ~1.5 it looks like. Monitor closely with daily BMP Bladder scan neg Only 200cc uop with lasix thus far. May need to get renal US if persists

## 2023-10-15 ENCOUNTER — Other Ambulatory Visit: Payer: Self-pay

## 2023-10-15 DIAGNOSIS — F1010 Alcohol abuse, uncomplicated: Secondary | ICD-10-CM

## 2023-10-15 DIAGNOSIS — I5023 Acute on chronic systolic (congestive) heart failure: Secondary | ICD-10-CM | POA: Diagnosis not present

## 2023-10-15 DIAGNOSIS — R45851 Suicidal ideations: Secondary | ICD-10-CM

## 2023-10-15 DIAGNOSIS — N179 Acute kidney failure, unspecified: Secondary | ICD-10-CM | POA: Diagnosis not present

## 2023-10-15 DIAGNOSIS — R453 Demoralization and apathy: Secondary | ICD-10-CM

## 2023-10-15 DIAGNOSIS — Z7189 Other specified counseling: Secondary | ICD-10-CM | POA: Diagnosis not present

## 2023-10-15 DIAGNOSIS — Z515 Encounter for palliative care: Secondary | ICD-10-CM

## 2023-10-15 LAB — CBC
HCT: 37.4 % — ABNORMAL LOW (ref 39.0–52.0)
Hemoglobin: 12.6 g/dL — ABNORMAL LOW (ref 13.0–17.0)
MCH: 30.7 pg (ref 26.0–34.0)
MCHC: 33.7 g/dL (ref 30.0–36.0)
MCV: 91 fL (ref 80.0–100.0)
Platelets: 236 10*3/uL (ref 150–400)
RBC: 4.11 MIL/uL — ABNORMAL LOW (ref 4.22–5.81)
RDW: 14.9 % (ref 11.5–15.5)
WBC: 7.7 10*3/uL (ref 4.0–10.5)
nRBC: 0 % (ref 0.0–0.2)

## 2023-10-15 LAB — BASIC METABOLIC PANEL
Anion gap: 13 (ref 5–15)
Anion gap: 11 (ref 5–15)
BUN: 60 mg/dL — ABNORMAL HIGH (ref 6–20)
BUN: 55 mg/dL — ABNORMAL HIGH (ref 6–20)
CO2: 25 mmol/L (ref 22–32)
CO2: 29 mmol/L (ref 22–32)
Calcium: 8.8 mg/dL — ABNORMAL LOW (ref 8.9–10.3)
Calcium: 9.2 mg/dL (ref 8.9–10.3)
Chloride: 96 mmol/L — ABNORMAL LOW (ref 98–111)
Chloride: 96 mmol/L — ABNORMAL LOW (ref 98–111)
Creatinine, Ser: 1.96 mg/dL — ABNORMAL HIGH (ref 0.61–1.24)
Creatinine, Ser: 1.73 mg/dL — ABNORMAL HIGH (ref 0.61–1.24)
GFR, Estimated: 38 mL/min — ABNORMAL LOW (ref >60)
GFR, Estimated: 45 mL/min — ABNORMAL LOW (ref >60)
Glucose, Bld: 126 mg/dL — ABNORMAL HIGH (ref 70–99)
Glucose, Bld: 138 mg/dL — ABNORMAL HIGH (ref 70–99)
Potassium: 3.1 mmol/L — ABNORMAL LOW (ref 3.5–5.1)
Potassium: 3.6 mmol/L (ref 3.5–5.1)
Sodium: 134 mmol/L — ABNORMAL LOW (ref 135–145)
Sodium: 136 mmol/L (ref 135–145)

## 2023-10-15 LAB — COOXEMETRY PANEL
Carboxyhemoglobin: 1.6 % — ABNORMAL HIGH (ref 0.5–1.5)
Methemoglobin: <0.7 % (ref 0.0–1.5)
O2 Saturation: 62.4 %
Total hemoglobin: 13.2 g/dL (ref 12.0–16.0)

## 2023-10-15 LAB — MAGNESIUM: Magnesium: 2.5 mg/dL — ABNORMAL HIGH (ref 1.7–2.4)

## 2023-10-15 MED ORDER — POTASSIUM CHLORIDE CRYS ER 20 MEQ PO TBCR
40.0000 meq | EXTENDED_RELEASE_TABLET | Freq: Two times a day (BID) | ORAL | Status: AC
  Administered 2023-10-15 (×2): 40 meq via ORAL
  Filled 2023-10-15 (×2): qty 2

## 2023-10-15 MED ORDER — LORAZEPAM 1 MG PO TABS
1.0000 mg | ORAL_TABLET | ORAL | Status: DC | PRN
  Administered 2023-10-16 (×2): 2 mg via ORAL
  Administered 2023-10-17 (×2): 1 mg via ORAL
  Filled 2023-10-15: qty 2
  Filled 2023-10-15 (×2): qty 1
  Filled 2023-10-15: qty 2
  Filled 2023-10-15: qty 1

## 2023-10-15 MED ORDER — DAPAGLIFLOZIN PROPANEDIOL 10 MG PO TABS
10.0000 mg | ORAL_TABLET | Freq: Every day | ORAL | Status: DC
  Administered 2023-10-15 – 2023-10-18 (×4): 10 mg via ORAL
  Filled 2023-10-15 (×5): qty 1

## 2023-10-15 MED ORDER — ENOXAPARIN SODIUM 30 MG/0.3ML IJ SOSY: 30.0000 mg | PREFILLED_SYRINGE | INTRAMUSCULAR | Status: DC

## 2023-10-15 MED ORDER — SODIUM CHLORIDE 0.9% FLUSH
10.0000 mL | Freq: Two times a day (BID) | INTRAVENOUS | Status: DC
  Administered 2023-10-15 – 2023-10-17 (×5): 10 mL
  Administered 2023-10-18 (×2): 30 mL
  Administered 2023-10-19: 10 mL
  Administered 2023-10-19: 20 mL
  Administered 2023-10-20: 10 mL
  Administered 2023-10-20: 30 mL
  Administered 2023-10-21: 20 mL
  Administered 2023-10-21 – 2023-10-22 (×2): 10 mL

## 2023-10-15 MED ORDER — SPIRONOLACTONE 25 MG PO TABS
25.0000 mg | ORAL_TABLET | Freq: Every day | ORAL | Status: DC
Start: 2023-10-15 — End: 2023-10-17
  Administered 2023-10-15 – 2023-10-16 (×2): 25 mg via ORAL
  Filled 2023-10-15 (×2): qty 1

## 2023-10-15 MED ORDER — MIRTAZAPINE 15 MG PO TBDP
7.5000 mg | ORAL_TABLET | Freq: Every day | ORAL | Status: DC
  Administered 2023-10-15 – 2023-10-23 (×9): 7.5 mg via ORAL
  Filled 2023-10-15 (×9): qty 0.5

## 2023-10-15 MED ORDER — METOLAZONE 5 MG PO TABS
5.0000 mg | ORAL_TABLET | Freq: Once | ORAL | Status: AC
  Administered 2023-10-15: 5 mg via ORAL
  Filled 2023-10-15: qty 1

## 2023-10-15 MED ORDER — ENOXAPARIN SODIUM 40 MG/0.4ML IJ SOSY
40.0000 mg | PREFILLED_SYRINGE | INTRAMUSCULAR | Status: DC
  Administered 2023-10-15 – 2023-10-24 (×9): 40 mg via SUBCUTANEOUS
  Filled 2023-10-15 (×10): qty 0.4

## 2023-10-15 MED ORDER — POTASSIUM CHLORIDE 10 MEQ/100ML IV SOLN
10.0000 meq | INTRAVENOUS | Status: AC
  Administered 2023-10-15 (×4): 10 meq via INTRAVENOUS
  Filled 2023-10-15 (×4): qty 100

## 2023-10-15 MED ORDER — FOLIC ACID 1 MG PO TABS
1.0000 mg | ORAL_TABLET | Freq: Every day | ORAL | Status: DC
  Administered 2023-10-16 – 2023-10-23 (×8): 1 mg via ORAL
  Filled 2023-10-15 (×8): qty 1

## 2023-10-15 MED ORDER — THIAMINE HCL 100 MG/ML IJ SOLN
200.0000 mg | INTRAVENOUS | Status: AC
  Administered 2023-10-16 – 2023-10-18 (×3): 200 mg via INTRAVENOUS
  Filled 2023-10-15 (×3): qty 2

## 2023-10-15 MED ORDER — ADULT MULTIVITAMIN W/MINERALS CH
1.0000 | ORAL_TABLET | Freq: Every day | ORAL | Status: DC
  Administered 2023-10-16 – 2023-10-23 (×8): 1 via ORAL
  Filled 2023-10-15 (×8): qty 1

## 2023-10-15 MED ORDER — LORAZEPAM 2 MG/ML IJ SOLN
1.0000 mg | INTRAMUSCULAR | Status: DC | PRN
  Administered 2023-10-16 – 2023-10-17 (×2): 2 mg via INTRAVENOUS
  Filled 2023-10-15 (×2): qty 1

## 2023-10-15 MED ORDER — THIAMINE MONONITRATE 100 MG PO TABS
100.0000 mg | ORAL_TABLET | Freq: Every day | ORAL | Status: DC
  Administered 2023-10-19 – 2023-10-23 (×5): 100 mg via ORAL
  Filled 2023-10-15 (×5): qty 1

## 2023-10-15 MED ORDER — AMIODARONE HCL IN DEXTROSE 360-4.14 MG/200ML-% IV SOLN
30.0000 mg/h | INTRAVENOUS | Status: DC
Start: 2023-10-15 — End: 2023-10-17
  Administered 2023-10-15 – 2023-10-16 (×4): 30 mg/h via INTRAVENOUS
  Filled 2023-10-15 (×4): qty 200

## 2023-10-15 MED ORDER — SODIUM CHLORIDE 0.9% FLUSH: 10.0000 mL | INTRAVENOUS | Status: DC | PRN

## 2023-10-15 NOTE — Plan of Care (Signed)
  Problem: Education: Goal: Knowledge of General Education information will improve Description: Including pain rating scale, medication(s)/side effects and non-pharmacologic comfort measures Outcome: Progressing   Problem: Health Behavior/Discharge Planning: Goal: Ability to manage health-related needs will improve Outcome: Progressing   Problem: Clinical Measurements: Goal: Will remain free from infection Outcome: Progressing Goal: Diagnostic test results will improve Outcome: Progressing Goal: Respiratory complications will improve Outcome: Progressing Goal: Cardiovascular complication will be avoided Outcome: Progressing   Problem: Activity: Goal: Risk for activity intolerance will decrease Outcome: Progressing   Problem: Nutrition: Goal: Adequate nutrition will be maintained Outcome: Progressing   Problem: Coping: Goal: Level of anxiety will decrease Outcome: Progressing   Problem: Elimination: Goal: Will not experience complications related to bowel motility Outcome: Progressing Goal: Will not experience complications related to urinary retention Outcome: Progressing   Problem: Pain Management: Goal: General experience of comfort will improve Outcome: Progressing   Problem: Safety: Goal: Ability to remain free from injury will improve Outcome: Progressing   Problem: Skin Integrity: Goal: Risk for impaired skin integrity will decrease Outcome: Progressing

## 2023-10-15 NOTE — Progress Notes (Signed)
Pt seen, awake, watching tv, no increased wob /respiratory distress noted or voiced by patient at this time.  HR92, RR18-23, spo2 99% on 5L nasal cannula.  Bipap is not indicated at this time.

## 2023-10-15 NOTE — Progress Notes (Signed)
Peripherally Inserted Central Catheter Placement  The IV Nurse has discussed with the patient and/or persons authorized to consent for the patient, the purpose of this procedure and the potential benefits and risks involved with this procedure.  The benefits include less needle sticks, lab draws from the catheter, and the patient may be discharged home with the catheter. Risks include, but not limited to, infection, bleeding, blood clot (thrombus formation), and puncture of an artery; nerve damage and irregular heartbeat and possibility to perform a PICC exchange if needed/ordered by physician.  Alternatives to this procedure were also discussed.  Bard Power PICC patient education guide, fact sheet on infection prevention and patient information card has been provided to patient /or left at bedside.    PICC Placement Documentation  PICC Triple Lumen 10/15/23 Right Brachial 42 cm 0 cm (Active)  Indication for Insertion or Continuance of Line Vasoactive infusions 10/15/23 1500  Exposed Catheter (cm) 0 cm 10/15/23 1500  Site Assessment Clean, Dry, Intact 10/15/23 1500  Lumen #1 Status Flushed;Saline locked;Blood return noted 10/15/23 1500  Lumen #2 Status Flushed;Saline locked;Blood return noted 10/15/23 1500  Lumen #3 Status Flushed;Saline locked;Blood return noted 10/15/23 1500  Dressing Type Transparent;Securing device 10/15/23 1500  Dressing Status Antimicrobial disc in place;Clean, Dry, Intact 10/15/23 1500  Line Care Connections checked and tightened 10/15/23 1500  Line Adjustment (NICU/IV Team Only) No 10/15/23 1500  Dressing Intervention New dressing 10/15/23 1500  Dressing Change Due 10/22/23 10/15/23 1500       Franne Grip Renee 10/15/2023, 3:20 PM

## 2023-10-15 NOTE — Progress Notes (Signed)
Patient ID: Dale [name], male   DOB: 07-10-1961, 62 y.o.   MRN: [id number]     Advanced Heart Failure Rounding Note  PCP-Cardiologist: Marca Ancona, MD   Subjective:    Breathing better this morning.  Good diuresis on Lasix gtt 20 mg/hr, weight down 4 lbs.  He is on milrinone 0.25 mcg/kg/min.  Creatinine trending down, 1.96 this morning.     Objective:   Weight Range: 111.8 kg Body mass index is 34.38 kg/m.   Vital Signs:   Temp:  [96.2 F (35.7 C)-97.9 F (36.6 C)] 97.6 F (36.4 C) (12/11 0710) Pulse Rate:  [74-123] 97 (12/11 0630) Resp:  [16-37] 26 (12/11 0630) BP: (69-159)/(41-135) 121/59 (12/11 0630) SpO2:  [73 %-100 %] 91 % (12/11 0630) Weight:  [111.8 kg] 111.8 kg (12/11 0500) Last BM Date : 10/13/23  Weight change: Filed Weights   10/14/23 0050 10/14/23 0445 10/15/23 0500  Weight: 114.4 kg 114.4 kg 111.8 kg    Intake/Output:   Intake/Output Summary (Last 24 hours) at 10/15/2023 0735 Last data filed at 10/15/2023 4098 Gross per 24 hour  Intake 2517.2 ml  Output 3740 ml  Net -1222.8 ml      Physical Exam    General:  Well appearing. No resp difficulty HEENT: Normal Neck: Supple. JVP 14-16 cm. Carotids 2+ bilat; no bruits. No lymphadenopathy or thyromegaly appreciated. Cor: PMI lateral. Regular rate & rhythm. No rubs, gallops or murmurs. Lungs: Clear Abdomen: Soft, nontender, nondistended. No hepatosplenomegaly. No bruits or masses. Good bowel sounds. Extremities: No cyanosis, clubbing, rash. 1+ edema to thighs.  Neuro: Alert & orientedx3, cranial nerves grossly intact. moves all 4 extremities w/o difficulty. Affect pleasant   Telemetry   NSR 100s with occasional PVCs and some runs of NSVT (personally reviewed)  Labs    CBC Recent Labs    10/13/23 1830 10/15/23 0252  WBC 6.6 7.7  NEUTROABS 4.9  --   HGB 13.6 12.6*  HCT 41.6 37.4*  MCV 93.7 91.0  PLT 284 236   Basic Metabolic Panel Recent Labs    11/91/47 0924 10/15/23 0252   NA 138 134*  K 3.3* 3.1*  CL 100 96*  CO2 23 25  GLUCOSE 117* 126*  BUN 64* 60*  CREATININE 2.11* 1.96*  CALCIUM 9.3 8.8*  MG  --  2.5*   Liver Function Tests Recent Labs    10/13/23 1830  AST 28  ALT 17  ALKPHOS 91  BILITOT 1.6*  PROT 7.6  ALBUMIN 3.9   No results for input(s): "LIPASE", "AMYLASE" in the last 72 hours. Cardiac Enzymes No results for input(s): "CKTOTAL", "CKMB", "CKMBINDEX", "TROPONINI" in the last 72 hours.  BNP: BNP (last 3 results) Recent Labs    09/03/23 0958 10/10/23 0249 10/13/23 1830  BNP 2,348.4* 3,459.8* >4,500.0*    ProBNP (last 3 results) No results for input(s): "PROBNP" in the last 8760 hours.   D-Dimer No results for input(s): "DDIMER" in the last 72 hours. Hemoglobin A1C No results for input(s): "HGBA1C" in the last 72 hours. Fasting Lipid Panel No results for input(s): "CHOL", "HDL", "LDLCALC", "TRIG", "CHOLHDL", "LDLDIRECT" in the last 72 hours. Thyroid Function Tests No results for input(s): "TSH", "T4TOTAL", "T3FREE", "THYROIDAB" in the last 72 hours.  Invalid input(s): "FREET3"  Other results:   Imaging    [place] EKG SITE RITE  Result Date: 10/15/2023 If Site Rite image not attached, placement could not be confirmed due to current cardiac rhythm.  [place] EKG SITE RITE  Result Date:  10/14/2023 If Site Rite image not attached, placement could not be confirmed due to current cardiac rhythm.    Medications:     Scheduled Medications:  aspirin EC  81 mg Oral Daily   atorvastatin  40 mg Oral Daily   Chlorhexidine Gluconate Cloth  6 each Topical Q0600   dapagliflozin propanediol  10 mg Oral Daily   famotidine  20 mg Oral BID   heparin injection (subcutaneous)  5,000 Units Subcutaneous Q8H   metolazone  5 mg Oral Once   potassium chloride  40 mEq Oral BID   sodium chloride flush  3 mL Intravenous Q12H   spironolactone  25 mg Oral Daily   thiamine (VITAMIN B1) injection  100 mg Intravenous Q24H    Infusions:   furosemide (LASIX) 200 mg in dextrose 5 % 100 mL (2 mg/mL) infusion 20 mg/hr (10/15/23 0600)   milrinone 0.25 mcg/kg/min (10/15/23 0600)   potassium chloride Stopped (10/15/23 0708)    PRN Medications: acetaminophen, LORazepam, mouth rinse, sodium chloride flush   Assessment/Plan   1. Acute on chronic systolic CHF: Mixed ischemic/nonischemic cardiomyopathy. He has a history of alcohol and cocaine abuse, which likely contributes to cardiomyopathy.  LHC in 7/20 and again in 5/23 showed RCA chronic occlusion. RHC in 5/23 showed low output with CI 1.53 and markedly elevated filling pressures.  Most recent echo in 7/24 showed EF < 20%, no LV thrombus, moderately decreased RV systolic function. He is readmitted with marked volume overload, hypoxemia, and NYHA class IIIb-IV symptoms.  Creatinine up to 2.1 initially though lactate normal at 1.8.  Based on past evaluation, suspect low output HF.  He is now on milrinone 0.25 + Lasix gtt 20 mg/hr with good diuresis yesterday, weight down 4 lbs. Creatinine trending down at 1.96. On exam, he remains volume overloaded.  Breathing better.  - He is now amenable to getting PICC to follow CVP and co-ox. Will re-order.  - Continue milrinone 0.25 mcg/kg/min to aide with diuresis.  He is not a candidate for home milrinone with substance abuse and functional homelessness.  - Continue Lasix 20 mg/hr and will give a dose of metolazone 5 mg x 1.   - Aggressive K replacement, check BMET in pm.  - Hold digoxin for now with rise in creatinine.  - Increase spironolactone to 25 mg daily.  - Continue home Farxiga 10 mg daily.  - Off Entresto due to hypotension and AKI in past.  - Off BiDil with low BP. - Repeat echo to make sure no LV thrombus.  - Not candidate for ICD unless he can cut back on ETOH abuse.  He has stopped cocaine. Narrow QRS so no CRT.  - Not an advanced therapies candidate with substance abuse, poor compliance, and living situation (basically homeless,  lives with friends or in hotels). Would also be poor home milrinone candidate.  We discussed end stage HF diagnosis, says he "just wants to get the fluid off" and not ready to commit to DNR.  Will make palliative care referral.  2. CAD: LHC in 7/20 and 5/23 showed CTO of RCA treated medically, left system ok. Suspect cardiomyopathy is primarily nonischemic. No chest pain. - Continue ASA 81 daily + atorvastatin 40 mg daily.  3. Cocaine Use Disorder: Has been off cocaine for > 2 years now.  4. Alcohol Use Disorder: Still drinking heavily at times, encouraged to continue to cut back even more 5. AKI on CKD stage 3: Baseline SCr 1.8, now up to 2.1 at  admission, suspect cardiorenal syndrome in setting of end stage HF.  Creatinine trending down today at 1.96.  - Support CO with milrinone.  - Continue diuresis.  6. NSVT: With milrinone and hypokalemia.  - Replace K.  - Will keep him on IV amiodarone while on milrinone, stop after he is titrated off.   Length of Stay: 1  Marca Ancona, MD  10/15/2023, 7:35 AM  Advanced Heart Failure Team Pager [phone] (M-F; 7a - 5p)  Please contact [facility] for night-coverage after hours (5p -7a ) and weekends on [email or web address]

## 2023-10-15 NOTE — Progress Notes (Signed)
[facility] Progress Note Patient: Dale [name] ZOX:[id number] DOB: [birth year] DOA: 10/13/2023  DOS: the patient was seen and examined on 10/15/2023  Brief hospital course: Dale [name] is a 62 y.o. male with medical history significant of HFrEF (EF < 20%) in setting of ICM with chronically occluded RCA as well as prior cocaine abuse and EtOH abuse.  Cardiology following. PICC line ordered. On Primacor.  Assessment and Plan: Acute on chronic systolic CHF. NICM. Alcohol and cocaine abuse. Appreciate cardiology consultation. PICC line ordered. CVP 19. On Primacor and Lasix drip. Continue Aldactone. Switch to ICU for now.  Nonobstructive CAD. Being treated medically. No chest pain. Aspirin and Lipitor.  History of cocaine use. Currently does not abuse. Monitor.  Ongoing alcohol abuse disorder. Monitor for withdrawal.  Acute kidney injury on CKD stage IIIb. Suspect cardiorenal hemodynamics. Monitor with diuresis.  Bilateral leg redness and swelling. Has been refusing DVT prophylaxis in the hospital. Will check Dopplers.  Acute respiratory failure with hypoxia. Required BiPAP. Currently on oxygen. Improving.  Suicidal ideation. Psychiatry consulted. Remeron added. On IV thiamine.  Psych will continue to follow.  Obesity Class 3 Body mass index is 34.38 kg/m.  Placing the pt at higher risk of poor outcomes.   Subjective: Continues to have shortness of breath.  Reports swelling in the leg.  Reports pain in his legs.  Reports being right great toe nail.  Also reports being suicidal.  Physical Exam: General: in Mild distress, No Rash Cardiovascular: S1 and S2 Present, No Murmur Respiratory: Good respiratory effort, Bilateral Air entry present. No Crackles, No wheezes Abdomen: Bowel Sound present, No tenderness Extremities: Bilateral leg redness and edema Neuro: Alert and oriented x3, no new focal deficit  Data Reviewed: I have Reviewed  nursing notes, Vitals, and Lab results. Since last encounter, pertinent lab results CBC and BMP   . I have ordered test including CBC and BMP  .   Disposition: Status is: Inpatient Remains inpatient appropriate because: Monitor for improvement in volume status  enoxaparin (LOVENOX) injection 40 mg Start: 10/15/23 1000   Family Communication: No one at bedside Level of care: ICU switching to ICU secondary to need for frequent monitoring Vitals:   10/15/23 1615 10/15/23 1641 10/15/23 1700 10/15/23 1800  BP:  (!) 141/85 122/76 122/79  Pulse: (!) 104 (!) 216 (!) 113 (!) 102  Resp: (!) 24 (!) 31 (!) 31 (!) 37  Temp: 98.6 F (37 C)     TempSrc: Oral     SpO2: 98% (!) 81% 90% (!) 88%  Weight:      Height:         Author: Lynden Oxford, MD 10/15/2023 7:15 PM  Please look on [email or web address] to find out who is on call.

## 2023-10-15 NOTE — Consult Note (Signed)
Palliative Care Consult Note                                  Date: 10/15/2023   Patient Name: Dale [name]  DOB: Sep 10, 1961  MRN: [id number]  Age / Sex: 62 y.o., male  PCP: Pcp, No Referring Physician: Rolly Salter, MD  Reason for Consultation: Establishing goals of care  HPI/Patient Profile: 62 y.o. male  with past medical history of HFrEF (EF < 20%) in setting of ICM with chronically occluded RCA as well as prior cocaine abuse and EtOH abuse.  He presented with complaints of increased peripheral edema and dyspnea on exertion.  He was admitted on 10/13/2023 with acute on chronic HFrEF, acute respiratory failure with hypoxia, AKI on CKD, and others.   Palliative medicine was consulted for GOC conversations.  Past Medical History:  Diagnosis Date   Alcohol abuse    CAD (coronary artery disease)    a. 05/2019 Cath: LM nl, LAD 15p, 22m, LCX large/nl, OM2 30, OM3 20, RCA 100 CTO w/ bridging L->R collats to RPDA.   Chronic combined systolic and diastolic CHF (congestive heart failure) (HCC)    a. 09/27/18 Echo: EF 20-25%, grade 2 DD; b. 03/2019 Echo: EF 20-25%; c. 05/2021 Echo: EF <10%, glob HK. GrII DD. Sev red RV fxn. Sev BAE. Mild MR. Mild-mod TR.   CKD (chronic kidney disease), stage II    Cocaine abuse (HCC)    Hyperlipidemia LDL goal <70    Hypertension    Ischemic cardiomyopathy    a. 09/27/18 Echo: EF 20-25%, grade 2 DD; b. 03/2019 Echo: EF 20-25%; c. 05/2019 Cath: RCA 100 CTA, otw nonobs dzs; d. 05/2021 Echo: EF <10%, glob HK. GrII DD. Sev red RV fxn.   Morbid obesity (HCC)    Noncompliance    Normocytic anemia     Subjective:   This NP Wynne Dust reviewed medical records, received report from team, assessed the patient and then meet at the patient's bedside to discuss diagnosis, prognosis, GOC, EOL wishes disposition and options.  I met with the patient at the bedside.  Unfortunately soon after I entered the room the IV  team came for sterile procedure/PICC line placement.  This kept our conversation somewhat more brief than normal.   We meet to discuss diagnosis prognosis, GOC, EOL wishes, disposition and options. Concept of Palliative Care was introduced as specialized medical care for people and their families living with serious illness.  If focuses on providing relief from the symptoms and stress of a serious illness.  The goal is to improve quality of life for both the patient and the family. Values and goals of care important to patient and family were attempted to be elicited.  Created space and opportunity for patient  and family to explore thoughts and feelings regarding current medical situation   Natural trajectory and current clinical status were discussed. Questions and concerns addressed. Patient  encouraged to call with questions or concerns.    Patient/Family Understanding of Illness: He understands he is very sick.  We were not able to elaborate on specific clinical details.  We will follow-up in more detail tomorrow.  Life Review: Deferred  Patient Values: Deferred  Goals: Deferred  Today's Discussion: In addition to discussions described above we discussed the role of palliative medicine.  I shared that my hope is to explain his clinical situation so that he understands what is  going on.  I also hope to get to know him and the things of importance to him to help guide his decisions on how to proceed with what ever options are available.  I explained that we will discuss each option open to him for treatment and help him understand what that looks like both today and weeks and months down the road.  This will help him make an educated decision on how he wants to proceed with his care.  We also discussed his expressed suicidal ideations from yesterday.  We talked about the differences between whether he truly wants to kill himself versus whether he is understanding how sick he is, the fact  that his disease is progressing towards end-of-life, and being accepting of death.  He states that "honestly is probably a little bit of both".  He explains that some of his triggers for his suicidal ideations are related to his living situation, etc.  I explained that we will be follow-up tomorrow I would discuss more about his serious illness and, at some point during his disease trajectory, coming to an acceptance of approaching end-of-life.  He expresses appreciation for my honesty and openness.  At this point I excused myself in the room so that the IV team could proceed with her sterile procedure for PICC line placement.  I told the patient I would follow-up tomorrow.  I provided emotional and general support through therapeutic listening, empathy, sharing of stories, and other techniques. I answered all questions and addressed all concerns to the best of my ability.  Review of Systems  Respiratory:  Negative for cough, chest tightness and shortness of breath (Improved).   Gastrointestinal:  Negative for abdominal pain, nausea and vomiting.    Objective:   Primary Diagnoses: Present on Admission:  Acute on chronic HFrEF (heart failure with reduced ejection fraction) (HCC)  CKD (chronic kidney disease), stage III (HCC)  Acute kidney injury superimposed on chronic kidney disease (HCC)  Acute respiratory failure with hypoxia (HCC)  Acute on chronic systolic CHF (congestive heart failure) (HCC)   Physical Exam Vitals and nursing note reviewed.  Constitutional:      General: He is not in acute distress.    Appearance: He is obese. He is ill-appearing.  HENT:     Head: Normocephalic and atraumatic.  Cardiovascular:     Rate and Rhythm: Tachycardia present.  Pulmonary:     Effort: Pulmonary effort is normal. No respiratory distress.  Abdominal:     General: There is no distension.  Skin:    General: Skin is warm and dry.  Neurological:     General: No focal deficit present.      Mental Status: He is alert.  Psychiatric:        Mood and Affect: Mood normal.        Behavior: Behavior normal.     Vital Signs:  BP 122/75   Pulse (!) 109   Temp 97.8 F (36.6 C) (Oral)   Resp 15   Ht 5\' 11"  (1.803 m)   Wt 111.8 kg   SpO2 99%   BMI 34.38 kg/m   Palliative Assessment/Data: 50-60%    Advanced Care Planning:   Existing Vynca/ACP Documentation: MOST form signed 06/15/2021  Primary Decision Maker: PATIENT  Code Status/Advance Care Planning: Full code  A discussion was had today regarding advanced directives. Concepts specific to code status, artifical feeding and hydration, continued IV antibiotics and rehospitalization was had.  The difference between a aggressive medical intervention  path and a palliative comfort care path for this patient at this time was had.   Decisions/Changes to ACP: None today  Assessment & Plan:   Impression: 62 year old male with acute presentation chronic comorbidities as described above.  The patient is at end-stage heart failure with minimal treatment options remaining.  Currently on diuresis and milrinone.  His expressed goal to cardiology is to "get the fluid off" and not interested in a DNR.  The patient expressed suicidal ideations yesterday and is since had a 24-hour sitter.  We briefly discussed serious illness and acceptance of death as possibly contributing to his expressions of wanting to die, although he does state that there is some other factors as well.  I told him we would follow-up more on the serious illness tomorrow.  Overall prognosis is poor.  SUMMARY OF RECOMMENDATIONS   Remain full code for now Continue current scope of care Continued emotional support of patient Palliative medicine will follow-up tomorrow  Symptom Management:  Per primary team PMT is available to assist as needed  Prognosis:  Unable to determine  Discharge Planning:  To Be Determined   Discussed with: Patient, medical team,  nursing team    Thank you for allowing [place] to participate in the care of [place] PMT will continue to support holistically.  Time Total: 40 min  Detailed review of medical records (labs, imaging, vital signs), medically appropriate exam, discussed with treatment team, counseling and education to patient, family, & staff, documenting clinical information, medication management, coordination of care  Signed by: Wynne Dust, NP Palliative Medicine Team  Team Phone # [phone] (Nights/Weekends)  10/15/2023, 3:40 PM

## 2023-10-15 NOTE — Consult Note (Addendum)
Dale [name] Psychiatry Consult Evaluation  Service Date: October 15, 2023 LOS:  LOS: 1 day    Primary Psychiatric Diagnoses  MDD, recurrent, severe 2.  Demoralization syndrome 3. EtOH abuse (severe), cocaine use (sustained remission)  Assessment  Dale [name] is a 62 y.o. male admitted medically for 10/13/2023  6:57 PM for CHF exacerbation. He carries the psychiatric diagnoses of EtOH abuse (severe), cocaine abuse (sustained remission) and MDD and has a past medical history of CAD, CHF, CKD, HLD, HTN, cardiomyopathy and anemia. Psychiatry was consulted for suicidal ideations by Dr. Edsel Petrin.   His current presentation of worsening of chronic depressive symptoms and lack of faith things can get better with chronic medical condition is most consistent with known history of MDD and demoralization syndrome.He has had minimal past medication trials. He consented to trial of remeron after discussion of r/b/se incl cardiac s/e with primary goal of improving in-hospital sleep for (hopefully) rapid improvement of mood and anxiety sx to baseline. Remeron with some risk of qtc prolongation however less than other agents; holding off on oral antipsychotics despite complaint of hallucinations (also explained by delirium, lack of sleep, etoh w/d). Pt on tele at time of initial consult and generally values align around quality of life. Some element of delirium (in ICU/likely etoh w/d) - things seem to get much worse at night for him so leaving sitter in place. On initial examination, patient was quite depressed but able to name some things he is looking forward to. Please see plan below for detailed recommendations.   Diagnoses:  Active Hospital problems: Principal Problem:   Acute on chronic HFrEF (heart failure with reduced ejection fraction) (HCC) Active Problems:   Acute kidney injury superimposed on chronic kidney disease (HCC)   CKD (chronic kidney disease), stage III (HCC)   Alcohol use  disorder, moderate, in early remission (HCC)   Acute on chronic systolic CHF (congestive heart failure) (HCC)   Acute respiratory failure with hypoxia (HCC)     Plan   ## Psychiatric Medication Recommendations:  -- s remeron 7.5 mg at bedtime -- dc prn ativan anxiety -- start ciwa orderset inc multivitamin/folate -- inc IV thiamine to 200  mg (comes in 200 mg vials) x3d then oral   ## Medical Decision Making Capacity:  Not formally assessed   ## Further Work-up:  -- None currently - potentially EKG tomorrow While pt on Qtc prolonging medications, please monitor & replete K+ to 4 and Mg2+ to 2   -- most recent EKG on 12/11 had QtC of 587  -- notably, pt with widened QRS and on amiodarone  -- Pertinent labwork reviewed earlier this admission includes: UDS + THC but negative for cocaine  ## Disposition:  -- Not medically stable. Currently would meet inpt criteria however hoping to treat depression in-hospital and refer to community  ## Behavioral / Environmental:  --  Utilize compassion and acknowledge the patient's experiences while setting clear and realistic expectations for care.    ## Safety and Observation Level:  - Based on my clinical evaluation, I estimate the patient to be at moderate risk of self harm in the current setting - At this time, we recommend a 1:1 level of observation. This decision is based on my review of the chart including patient's history and current presentation, interview of the patient, mental status examination, and consideration of suicide risk including evaluating suicidal ideation, plan, intent, suicidal or self-harm behaviors, risk factors, and protective factors. This judgment is based on  our ability to directly address suicide risk, implement suicide prevention strategies and develop a safety plan while the patient is in the clinical setting. Please contact our team if there is a concern that risk level has changed.  Suicide risk  assessment  Patient has following modifiable risk factors for suicide: active suicidal ideation, untreated depression, and lack of access to outpatient mental health resources, which we are addressing by prescribing remeron and ensuring appropriate f/u.   Patient has following non-modifiable or demographic risk factors for suicide: male gender and history of suicide attempt  Patient has the following protective factors against suicide: Supportive family and Cultural, spiritual, or religious beliefs that discourage suicide, future orientation   Thank you for this consult request. Recommendations have been communicated to the primary team.  We will continue to follow at this time.   Aylani Spurlock A Shana Younge  Psychiatric and Social History   Relevant Aspects of Hospital Course:  Admitted on 10/13/2023 for CHF exacerbation. They endorsed SI overnight.   Patient Report:  Pt seen in late afternoon; quite pleasant. Alert and mostly oriented (thought it was 1923) with poor attention/memory . 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 Discusses chronic struggles with depression and suicidality - has had thoughts here, no real plan. Discussed recent etoh use estimates >50% of calories from etoh and no days w/o drinking. Sounds like hallucinations worse here. He has no memory of pall care visit "was that the person with a cop?" .   Main reasons for living are upcoming holidays, good relationship with his kids (18s), and 3 y/o grandson. Mood reactive - does feel much better when around family.   He is very proud of multiyear sobriety from cocaine.   Psych ROS:  Depression: Mostly anxious depressed picture - poor sleep, ruminative thoughts, always worried, poor concentration/appetite.  Anxiety:  tied into sx depression Mania (lifetime and current): denies - had manic sx while using cocaine, not since stopped Psychosis: (lifetime and current):  hallucinations here in hospital  Collateral information:  None 12/11  Psychiatric History:  Information collected from pt, medical record  Prev Dx/Sx: multiple substance use d/o, depression, PTSD Current Psych Provider: none Home Meds (current): none Previous Med Trials: unknown Therapy: none  Prior ECT: no Prior Psych Hospitalization: it sounded like x1  Prior Self Harm: one attempt, hanging, a couple of years ago Prior Violence: not evaluated  Family Psych History: "they are all crazy" Family Hx suicide: uncles  Social History:  Divorced, married 19 years, 2 kids Developmental Hx: unknown Educational Hx: unknown Occupational Hx: disability for heart Legal Hx: unknown Living Situation: in a "drug den" Spiritual Hx: Spiritual Access to weapons: no  Substance History Tobacco use: no Alcohol use: yes, every day for several months, gets irritable, nervous, ugly when stops. Thinks he has had seizures but not sure. Hleps sleep Drug use: marijuana daily    Exam Findings   Psychiatric Specialty Exam:  Presentation  General Appearance: Appropriate for Environment  Eye Contact:Good  Speech:Clear and Coherent  Speech Volume:Normal  Handedness:No data recorded  Mood and Affect  Mood:Depressed  Affect:Congruent; Full Range; Appropriate   Thought Process  Thought Processes:Coherent; Goal Directed  Descriptions of Associations:Intact  Orientation:Partial  Thought Content:-- (devoid of delusions/paranoia)  Hallucinations:Hallucinations: None  Ideas of Reference:None  Suicidal Thoughts:Suicidal Thoughts: Yes, Passive SI Passive Intent and/or Plan: Without Intent; Without Plan  Homicidal Thoughts:Homicidal Thoughts: No   Sensorium  Memory:Immediate  Poor; Recent Fair; Remote Good  Judgment:Fair  Insight:Fair   Executive Functions  Concentration:Fair  Attention Span:Poor  Recall:Fair  Fund of Knowledge:Good  Language:Good   Psychomotor  Activity  Psychomotor Activity:Psychomotor Activity: Normal   Assets  Assets:Desire for Improvement; Manufacturing systems engineer; Social Support   Sleep  Sleep:Sleep: Poor    Physical Exam: Vital signs:  Temp:  [97.6 F (36.4 C)-98.6 F (37 C)] 98.6 F (37 C) (12/11 1615) Pulse Rate:  [74-109] 99 (12/11 1500) Resp:  [15-37] 28 (12/11 1500) BP: (90-159)/(55-134) 95/55 (12/11 1500) SpO2:  [85 %-100 %] 96 % (12/11 1500) Weight:  [111.8 kg] 111.8 kg (12/11 0500) Physical Exam  Blood pressure (!) 95/55, pulse 99, temperature 98.6 F (37 C), temperature source Oral, resp. rate (!) 28, height 5\' 11"  (1.803 m), weight 111.8 kg, SpO2 96%. Body mass index is 34.38 kg/m.   Other History   These have been pulled in through the EMR, reviewed, and updated if appropriate.   Family History:  The patient's family history includes Hypertension in his maternal grandmother.  Medical History: Past Medical History:  Diagnosis Date   Alcohol abuse    CAD (coronary artery disease)    a. 05/2019 Cath: LM nl, LAD 15p, 28m, LCX large/nl, OM2 30, OM3 20, RCA 100 CTO w/ bridging L->R collats to RPDA.   Chronic combined systolic and diastolic CHF (congestive heart failure) (HCC)    a. 09/27/18 Echo: EF 20-25%, grade 2 DD; b. 03/2019 Echo: EF 20-25%; c. 05/2021 Echo: EF <10%, glob HK. GrII DD. Sev red RV fxn. Sev BAE. Mild MR. Mild-mod TR.   CKD (chronic kidney disease), stage II    Cocaine abuse (HCC)    Hyperlipidemia LDL goal <70    Hypertension    Ischemic cardiomyopathy    a. 09/27/18 Echo: EF 20-25%, grade 2 DD; b. 03/2019 Echo: EF 20-25%; c. 05/2019 Cath: RCA 100 CTA, otw nonobs dzs; d. 05/2021 Echo: EF <10%, glob HK. GrII DD. Sev red RV fxn.   Morbid obesity (HCC)    Noncompliance    Normocytic anemia     Surgical History: Past Surgical History:  Procedure Laterality Date   RIGHT/LEFT HEART CATH AND CORONARY ANGIOGRAPHY N/A 05/10/2019   Procedure: RIGHT/LEFT HEART CATH AND CORONARY ANGIOGRAPHY;   Surgeon: Yvonne Kendall, MD;  Location: [facility] INVASIVE CV LAB;  Service: Cardiovascular;  Laterality: N/A;   RIGHT/LEFT HEART CATH AND CORONARY ANGIOGRAPHY N/A 03/18/2022   Procedure: RIGHT/LEFT HEART CATH AND CORONARY ANGIOGRAPHY;  Surgeon: Laurey Morale, MD;  [facility] INVASIVE CV LAB;  Service: Cardiovascular;  Laterality: N/A;    Medications:   Current Facility-Administered Medications:    acetaminophen (TYLENOL) tablet 650 mg, 650 mg, Oral, Q4H PRN, Julian Reil, Jared M, DO   amiodarone (NEXTERONE PREMIX) 360-4.14 MG/200ML-% (1.8 mg/mL) IV infusion, 30 mg/hr, Intravenous, Continuous, Laurey Morale, MD, Last Rate: 16.67 mL/hr at 10/15/23 1100, 30 mg/hr at 10/15/23 1100   aspirin EC tablet 81 mg, 81 mg, Oral, Daily, Julian Reil, Jared M, DO, 81 mg at 10/15/23 0949   atorvastatin (LIPITOR) tablet 40 mg, 40 mg, Oral, Daily, Julian Reil, Jared M, DO, 40 mg at 10/15/23 0948   Chlorhexidine Gluconate Cloth 2 % PADS 6 each, 6 each, Topical, Q0600, Hillary Bow, DO, 6 each at 10/14/23 2376   dapagliflozin propanediol (FARXIGA) tablet 10 mg, 10 mg, Oral, Daily, Laurey Morale, MD, 10 mg at 10/15/23 0951   enoxaparin (LOVENOX) injection 40 mg, 40 mg, Subcutaneous, Q24H, Laurey Morale, MD,  40 mg at 10/15/23 0945   famotidine (PEPCID) tablet 20 mg, 20 mg, Oral, BID, Julian Reil, Jared M, DO, 20 mg at 10/15/23 1610   folic acid (FOLVITE) tablet 1 mg, 1 mg, Oral, Daily, Gabor Lusk A   furosemide (LASIX) 200 mg in dextrose 5 % 100 mL (2 mg/mL) infusion, 20 mg/hr, Intravenous, Continuous, Julian Reil, Jared M, DO, Last Rate: 10 mL/hr at 10/15/23 1100, 20 mg/hr at 10/15/23 1100   LORazepam (ATIVAN) tablet 1-4 mg, 1-4 mg, Oral, Q1H PRN **OR** LORazepam (ATIVAN) injection 1-4 mg, 1-4 mg, Intravenous, Q1H PRN, Georgana Romain A   milrinone (PRIMACOR) 20 MG/100 ML (0.2 mg/mL) infusion, 0.25 mcg/kg/min, Intravenous, Continuous, Laurey Morale, MD, Last Rate: 8.58 mL/hr at 10/15/23 1100, 0.25  mcg/kg/min at 10/15/23 1100   mirtazapine (REMERON SOL-TAB) disintegrating tablet 7.5 mg, 7.5 mg, Oral, QHS, Cecelia Graciano A   multivitamin with minerals tablet 1 tablet, 1 tablet, Oral, Daily, Edmar Blankenburg A   Oral care mouth rinse, 15 mL, Mouth Rinse, PRN, Osvaldo Shipper, MD   potassium chloride SA (KLOR-CON M) CR tablet 40 mEq, 40 mEq, Oral, BID, Laurey Morale, MD, 40 mEq at 10/15/23 0948   sodium chloride flush (NS) 0.9 % injection 10-40 mL, 10-40 mL, Intracatheter, Q12H, Rolly Salter, MD   sodium chloride flush (NS) 0.9 % injection 10-40 mL, 10-40 mL, Intracatheter, PRN, Rolly Salter, MD   spironolactone (ALDACTONE) tablet 25 mg, 25 mg, Oral, Daily, Laurey Morale, MD, 25 mg at 10/15/23 0947   [START ON 10/16/2023] thiamine (VITAMIN B1) 200 mg in sodium chloride 0.9 % 50 mL IVPB, 200 mg, Intravenous, Q24H **FOLLOWED BY** [START ON 10/19/2023] thiamine (VITAMIN B1) tablet 100 mg, 100 mg, Oral, Daily, Christobal Morado A  Allergies: Allergies  Allergen Reactions   Hydrocodone Itching

## 2023-10-16 ENCOUNTER — Inpatient Hospital Stay (HOSPITAL_COMMUNITY): Payer: MEDICAID

## 2023-10-16 DIAGNOSIS — R609 Edema, unspecified: Secondary | ICD-10-CM

## 2023-10-16 DIAGNOSIS — Z7189 Other specified counseling: Secondary | ICD-10-CM | POA: Diagnosis not present

## 2023-10-16 DIAGNOSIS — I5020 Unspecified systolic (congestive) heart failure: Secondary | ICD-10-CM

## 2023-10-16 DIAGNOSIS — Z515 Encounter for palliative care: Secondary | ICD-10-CM | POA: Diagnosis not present

## 2023-10-16 DIAGNOSIS — I5023 Acute on chronic systolic (congestive) heart failure: Secondary | ICD-10-CM | POA: Diagnosis not present

## 2023-10-16 DIAGNOSIS — I5021 Acute systolic (congestive) heart failure: Secondary | ICD-10-CM

## 2023-10-16 LAB — BASIC METABOLIC PANEL
Anion gap: 7 (ref 5–15)
BUN: 46 mg/dL — ABNORMAL HIGH (ref 6–20)
CO2: 26 mmol/L (ref 22–32)
Calcium: 7.2 mg/dL — ABNORMAL LOW (ref 8.9–10.3)
Chloride: 104 mmol/L (ref 98–111)
Creatinine, Ser: 1.49 mg/dL — ABNORMAL HIGH (ref 0.61–1.24)
GFR, Estimated: 53 mL/min — ABNORMAL LOW (ref >60)
Glucose, Bld: 101 mg/dL — ABNORMAL HIGH (ref 70–99)
Potassium: 2.6 mmol/L — CL (ref 3.5–5.1)
Sodium: 137 mmol/L (ref 135–145)

## 2023-10-16 LAB — RENAL FUNCTION PANEL
Albumin: 3.6 g/dL (ref 3.5–5.0)
Anion gap: 9 (ref 5–15)
BUN: 53 mg/dL — ABNORMAL HIGH (ref 6–20)
CO2: 34 mmol/L — ABNORMAL HIGH (ref 22–32)
Calcium: 9.5 mg/dL (ref 8.9–10.3)
Chloride: 90 mmol/L — ABNORMAL LOW (ref 98–111)
Creatinine, Ser: 2.02 mg/dL — ABNORMAL HIGH (ref 0.61–1.24)
GFR, Estimated: 37 mL/min — ABNORMAL LOW (ref >60)
Glucose, Bld: 146 mg/dL — ABNORMAL HIGH (ref 70–99)
Phosphorus: 3.8 mg/dL (ref 2.5–4.6)
Potassium: 4.3 mmol/L (ref 3.5–5.1)
Sodium: 133 mmol/L — ABNORMAL LOW (ref 135–145)

## 2023-10-16 LAB — COOXEMETRY PANEL
Carboxyhemoglobin: 1.2 % (ref 0.5–1.5)
Methemoglobin: <0.7 % (ref 0.0–1.5)
O2 Saturation: 65.2 %
Total hemoglobin: 13.3 g/dL (ref 12.0–16.0)

## 2023-10-16 LAB — ECHOCARDIOGRAM COMPLETE
Area-P 1/2: 7.74 cm2
Calc EF: 22.9 %
Est EF: <20
Height: 71 in
S' Lateral: 8.6 cm
Single Plane A2C EF: 21.8 %
Single Plane A4C EF: 20.9 %
Weight: 3742.53 [oz_av]

## 2023-10-16 LAB — MAGNESIUM: Magnesium: 1.9 mg/dL (ref 1.7–2.4)

## 2023-10-16 LAB — CBC
HCT: 37.5 % — ABNORMAL LOW (ref 39.0–52.0)
Hemoglobin: 12.7 g/dL — ABNORMAL LOW (ref 13.0–17.0)
MCH: 30.7 pg (ref 26.0–34.0)
MCHC: 33.9 g/dL (ref 30.0–36.0)
MCV: 90.6 fL (ref 80.0–100.0)
Platelets: 245 10*3/uL (ref 150–400)
RBC: 4.14 MIL/uL — ABNORMAL LOW (ref 4.22–5.81)
RDW: 14.8 % (ref 11.5–15.5)
WBC: 7.1 10*3/uL (ref 4.0–10.5)
nRBC: 0 % (ref 0.0–0.2)

## 2023-10-16 MED ORDER — POTASSIUM CHLORIDE 10 MEQ/100ML IV SOLN
10.0000 meq | INTRAVENOUS | Status: AC
  Administered 2023-10-16 (×2): 10 meq via INTRAVENOUS
  Filled 2023-10-16 (×2): qty 100

## 2023-10-16 MED ORDER — METOLAZONE 5 MG PO TABS
5.0000 mg | ORAL_TABLET | Freq: Once | ORAL | Status: AC
  Administered 2023-10-16: 5 mg via ORAL
  Filled 2023-10-16: qty 1

## 2023-10-16 MED ORDER — LORAZEPAM 0.5 MG PO TABS: 0.5000 mg | ORAL_TABLET | Freq: Every morning | ORAL | Status: DC

## 2023-10-16 MED ORDER — POTASSIUM CHLORIDE CRYS ER 20 MEQ PO TBCR
40.0000 meq | EXTENDED_RELEASE_TABLET | ORAL | Status: AC
  Administered 2023-10-16 (×4): 40 meq via ORAL
  Filled 2023-10-16 (×4): qty 2

## 2023-10-16 MED ORDER — ISOSORB DINITRATE-HYDRALAZINE 20-37.5 MG PO TABS
0.5000 | ORAL_TABLET | Freq: Three times a day (TID) | ORAL | Status: DC
  Administered 2023-10-16 – 2023-10-18 (×7): 0.5 via ORAL
  Filled 2023-10-16 (×8): qty 0.5

## 2023-10-16 MED ORDER — CALCIUM GLUCONATE-NACL 1-0.675 GM/50ML-% IV SOLN
1.0000 g | Freq: Once | INTRAVENOUS | Status: AC
  Administered 2023-10-16: 1000 mg via INTRAVENOUS
  Filled 2023-10-16: qty 50

## 2023-10-16 MED ORDER — POTASSIUM CHLORIDE 10 MEQ/50ML IV SOLN
10.0000 meq | INTRAVENOUS | Status: AC
  Administered 2023-10-16 (×4): 10 meq via INTRAVENOUS
  Filled 2023-10-16 (×4): qty 50

## 2023-10-16 MED ORDER — DIGOXIN 125 MCG PO TABS
0.1250 mg | ORAL_TABLET | Freq: Every day | ORAL | Status: DC
  Administered 2023-10-16: 0.125 mg via ORAL
  Filled 2023-10-16: qty 1

## 2023-10-16 MED ORDER — POTASSIUM CHLORIDE CRYS ER 20 MEQ PO TBCR
40.0000 meq | EXTENDED_RELEASE_TABLET | Freq: Four times a day (QID) | ORAL | Status: DC
  Administered 2023-10-16: 40 meq via ORAL
  Filled 2023-10-16: qty 2

## 2023-10-16 MED ORDER — LORAZEPAM 1 MG PO TABS
1.0000 mg | ORAL_TABLET | Freq: Every day | ORAL | Status: DC
  Administered 2023-10-16: 1 mg via ORAL
  Filled 2023-10-16: qty 1

## 2023-10-16 MED ORDER — POTASSIUM CHLORIDE 10 MEQ/100ML IV SOLN: 10.0000 meq | INTRAVENOUS | Status: DC

## 2023-10-16 MED ORDER — MAGNESIUM SULFATE 2 GM/50ML IV SOLN
2.0000 g | Freq: Once | INTRAVENOUS | Status: AC
  Administered 2023-10-16: 2 g via INTRAVENOUS
  Filled 2023-10-16: qty 50

## 2023-10-16 NOTE — Progress Notes (Signed)
Orthopedic Tech Progress Note Patient Details:  Orlin Pitta 07-18-1961 [id number]  Ortho Devices Type of Ortho Device: Roland Rack boot Ortho Device/Splint Location: bi lat unna boot applied Ortho Device/Splint Interventions: Ordered, Application, Adjustment   Post Interventions Patient Tolerated: Well Instructions Provided: Adjustment of device, Care of device  Kizzie Fantasia 10/16/2023, 3:50 PM

## 2023-10-16 NOTE — Progress Notes (Signed)
  Echocardiogram 2D Echocardiogram has been performed.  Leda Roys RDCS 10/16/2023, 9:10 AM

## 2023-10-16 NOTE — Progress Notes (Signed)
Daily Progress Note   Patient Name: Dale [name]       Date: 10/16/2023 DOB: Sep 08, 1961  Age: 62 y.o. MRN#: [id number] Attending Physician: Rolly Salter, MD Primary Care Physician: Pcp, No Admit Date: 10/13/2023 Length of Stay: 2 days  Reason for Consultation/Follow-up: Establishing goals of care  HPI/Patient Profile:  62 y.o. male  with past medical history of HFrEF (EF < 20%) in setting of ICM with chronically occluded RCA as well as prior cocaine abuse and EtOH abuse.  He presented with complaints of increased peripheral edema and dyspnea on exertion.  He was admitted on 10/13/2023 with acute on chronic HFrEF, acute respiratory failure with hypoxia, AKI on CKD, and others.    Palliative medicine was consulted for GOC conversations.  Subjective:   Subjective: Chart Reviewed. Updates received. Patient Assessed. Created space and opportunity for patient  and family to explore thoughts and feelings regarding current medical situation.  Today's Discussion: Today saw the patient at the bedside.  Later during my visit psychiatry came to evaluate the patient.  At this point he denies pain, nausea, vomiting.  We had an extensive discussion on his current clinical situation.  I explained, in summary, the extent of his heart failure, the very limited/minimal to no options for improvement.  He notes that his heart is weak and he needs to stop drinking alcohol and change his diet and decrease his salt.  He states that "in the past I have been hardheaded and not doing this."    We also had a good discussion on CODE STATUS.  I described CPR, the statistical chances of success, and the intent suffering that can occur with resuscitation.  At the end of our conversation he stated "I think I can turn this around" indicating and affirming that he would like to remain full code and full scope and he is hopeful that his heart failure will reverse.  After that we spent some time discussing his life.  He  has 2 children a daughter age 41 who works in daycare and a son who is age 78 who works with for Research scientist (physical sciences).  His son's wife is also in healthcare.  He also has a grandson who is 42 years old.  He has a very strong faith and focuses on Harmony, peace, love, and respect.  He does note he is breathing better today.  He has poor sleep last night not because he is unable to fall asleep but because he has to frequently get up to pee.  He is worried about his kids and his grandkids seeing him in his current situation, his grandson does not know that his grandfather is sick.  Things that are important to him include family and faith.  His goals are to "get better".  At the end of my visit I shared that I would follow-up tomorrow to check on him and see how he is doing.  He excepted my offer.  I later discussed with the medical team the fact that he seems to be hearing the information but not processing or accepting it.  This is supported by the fact that he has seem to have similar sentiments with advanced heart failure team and wanting to "just get the fluid off and get out."  I fear that his depression and mental illness may be impacting his ability to accept and process what is going on with his health.  I provided emotional and general support through therapeutic listening, empathy, sharing of stories,  therapeutic touch, and other techniques. I answered all questions and addressed all concerns to the best of my ability.  Review of Systems  Constitutional:        Denies pain in general  Respiratory:  Positive for shortness of breath (Improved). Negative for chest tightness.        Overall "breathing is better."  Cardiovascular:  Positive for leg swelling (Improving).  Gastrointestinal:  Negative for abdominal pain, nausea and vomiting.    Objective:   Vital Signs:  BP 124/86   Pulse 96   Temp 97.6 F (36.4 C) (Oral)   Resp (!) 25   Ht 5\' 11"  (1.803 m)   Wt 106.1 kg   SpO2 100%   BMI 32.62  kg/m   Physical Exam Vitals and nursing note reviewed.  Constitutional:      General: He is not in acute distress.    Appearance: He is ill-appearing.  HENT:     Head: Normocephalic and atraumatic.  Cardiovascular:     Rate and Rhythm: Normal rate.     Comments: Noted intermittent ectopy/PVCs Pulmonary:     Effort: Pulmonary effort is normal. No respiratory distress.  Abdominal:     General: Abdomen is protuberant. Bowel sounds are normal. There is no distension.     Palpations: Abdomen is soft.  Skin:    General: Skin is warm and dry.  Neurological:     General: No focal deficit present.     Mental Status: He is alert.  Psychiatric:        Mood and Affect: Mood normal.        Behavior: Behavior normal.     Comments: Mood is intermittently agitated     Palliative Assessment/Data: 40%    Existing Vynca/ACP Documentation: MOST form signed 06/15/2021 DNR effective 06/15/2021 however patient expresses desire for full code currently  Assessment & Plan:   Impression: Present on Admission:  Acute on chronic HFrEF (heart failure with reduced ejection fraction) (HCC)  CKD (chronic kidney disease), stage III (HCC)  Acute kidney injury superimposed on chronic kidney disease (HCC)  Acute respiratory failure with hypoxia (HCC)  Acute on chronic systolic CHF (congestive heart failure) (HCC)  62 year old male with acute presentation chronic comorbidities as described above.  The patient is at end-stage heart failure with minimal treatment options remaining.  Currently on diuresis and milrinone.  His expressed goal to cardiology is to "get the fluid off" and not interested in a DNR.  The patient expressed suicidal ideations yesterday and is since had a 24-hour sitter.  We briefly discussed serious illness and acceptance of death as possibly contributing to his expressions of wanting to die, although he does state that there is some other factors as well.  He does not seem to accept her  process how sick he is and that there are limited options for treatment.  He feels that "I can reverse this."  He is wanting to remain a full code and his goals are to get better and leave the hospital.  I told him we would follow-up more tomorrow.  Overall prognosis is poor.  SUMMARY OF RECOMMENDATIONS   Remain full code Continue full scope of care Continue emotional support of patient Continue to establish rapport Ongoing discussions about goals of care Palliative medicine will continue to follow  Symptom Management:  Per primary team PMT is available to assist as needed  Code Status: Full code  Prognosis: Unable to determine  Discharge Planning: To Be Determined  Discussed  with: Patient, medical team, nursing team  Thank you for allowing [place] to participate in the care of Dale [name] PMT will continue to support holistically.  Time Total: 60 min  Detailed review of medical records (labs, imaging, vital signs), medically appropriate exam, discussed with treatment team, counseling and education to patient, family, & staff, documenting clinical information, medication management, coordination of care  Wynne Dust, NP Palliative Medicine Team  Team Phone # [phone] (Nights/Weekends)  07/03/2021, 8:17 AM

## 2023-10-16 NOTE — Progress Notes (Signed)
Pt is asleep, resting comfortably, no increased wob or respiratory distress noted.  HR91, RR24, spo2 93% on 5L [place].  Bipap not indicated at this time.

## 2023-10-16 NOTE — Progress Notes (Signed)
[facility] Progress Note Patient: Dale [name] MWU:[id number] DOB: [birth year] DOA: 10/13/2023  DOS: the patient was seen and examined on 10/16/2023  Brief hospital course: Mitchum Urdaneta is a 62 y.o. male with medical history significant of HFrEF (EF < 20%) in setting of ICM with chronically occluded RCA as well as prior cocaine abuse and EtOH abuse.  Cardiology following. PICC line ordered. On Primacor.  Assessment and Plan: Acute on chronic systolic CHF. NICM. Alcohol and cocaine abuse. Appreciate cardiology consultation. PICC line ordered. CVP 19. On Primacor and Lasix drip. Continue Aldactone. Switch to ICU for now.  Nonobstructive CAD. Being treated medically. No chest pain. Aspirin and Lipitor.  History of cocaine use. Currently does not abuse. Monitor.  Ongoing alcohol abuse disorder. Monitor for withdrawal.  Acute kidney injury on CKD stage IIIb. Suspect cardiorenal hemodynamics. Monitor with diuresis.  Currently worsening.  Hypokalemia as well as hypocalcemia. Replaced.  Bilateral leg redness and swelling. Has been refusing DVT prophylaxis in the hospital. Lower EXTR Doppler has not negative. On notes ordered.  Acute respiratory failure with hypoxia. Required BiPAP. Currently on oxygen. Improving.  Suicidal ideation. Psychiatry consulted. Remeron added.  Now discontinued.  Psychiatry recommended to initiate scheduled Ativan. On IV thiamine.  Psych will continue to follow.  Obesity Class 3 Body mass index is 32.62 kg/m.  Placing the pt at higher risk of poor outcomes.   Subjective: No acute complaint.  No nausea no vomiting no fever no chills.  No chest pain.  Physical Exam: In mild distress.  Tachypneic. Basal crackles. Bowel sound present. Lower extremity edema with redness seen unchanged.  Data Reviewed: I have Reviewed nursing notes, Vitals, and Lab results. Discussed with psychiatry. Reviewed CBC and BMP. Reordered  CBC and BMP.  Disposition: Status is: Inpatient Remains inpatient appropriate because: Monitor for improvement in volume status  enoxaparin (LOVENOX) injection 40 mg Start: 10/15/23 1000   Family Communication: No one at bedside Level of care: ICU switching to ICU secondary to need for frequent monitoring Vitals:   10/16/23 1627 10/16/23 1700 10/16/23 1800 10/16/23 1843  BP:  124/82 100/74 (!) 145/92  Pulse:  88 91 93  Resp:  (!) 24 (!) 25   Temp: 97.7 F (36.5 C)     TempSrc: Oral     SpO2:  93% 94%   Weight:      Height:         Author: Lynden Oxford, MD 10/16/2023 7:14 PM  Please look on [email or web address] to find out who is on call.
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
## 2023-10-16 NOTE — Progress Notes (Signed)
Bilateral lower extremity venous duplex has been completed. Preliminary results can be found in CV Proc through chart review.   10/16/23 9:13 AM Olen Cordial RVT

## 2023-10-16 NOTE — Plan of Care (Signed)

## 2023-10-16 NOTE — Progress Notes (Signed)
Patient ID: Dale [name], male   DOB: 12-Jul-1961, 63 y.o.   MRN: [id number]      Advanced Heart Failure Rounding Note  PCP-Cardiologist: Marca Ancona, MD   Subjective:    Breathing better this morning.  Good diuresis on Lasix gtt 20 mg/hr + metolazone 5 x 1, weight down 12 lbs. CVP still 17.  He is on milrinone 0.25 mcg/kg/min with co-ox 65%.  Creatinine trending down, 1.73 => 1.49.      Objective:   Weight Range: 106.1 kg Body mass index is 32.62 kg/m.   Vital Signs:   Temp:  [97.6 F (36.4 C)-98.6 F (37 C)] 97.6 F (36.4 C) (12/12 0712) Pulse Rate:  [81-216] 91 (12/12 0700) Resp:  [15-37] 24 (12/12 0700) BP: (94-152)/(55-134) 116/75 (12/12 0700) SpO2:  [81 %-100 %] 94 % (12/12 0700) Weight:  [106.1 kg] 106.1 kg (12/12 0515) Last BM Date : 10/13/23  Weight change: Filed Weights   10/14/23 0445 10/15/23 0500 10/16/23 0515  Weight: 114.4 kg 111.8 kg 106.1 kg    Intake/Output:   Intake/Output Summary (Last 24 hours) at 10/16/2023 0738 Last data filed at 10/16/2023 0725 Gross per 24 hour  Intake 2246.12 ml  Output 50093 ml  Net -8303.88 ml      Physical Exam    General: NAD Neck: JVP 16 cm, no thyromegaly or thyroid nodule.  Lungs: Clear to auscultation bilaterally with normal respiratory effort. CV: Lateral PMI.  Heart regular S1/S2, no S3/S4, no murmur.  1+ edema to knees.  Abdomen: Soft, nontender, no hepatosplenomegaly, no distention.  Skin: Intact without lesions or rashes.  Neurologic: Alert and oriented x 3.  Psych: Normal affect. Extremities: No clubbing or cyanosis.  HEENT: Normal.   Telemetry   NSR 90s with occasional PVCs (personally reviewed)  Labs    CBC Recent Labs    10/13/23 1830 10/15/23 0252 10/16/23 0521  WBC 6.6 7.7 7.1  NEUTROABS 4.9  --   --   HGB 13.6 12.6* 12.7*  HCT 41.6 37.4* 37.5*  MCV 93.7 91.0 90.6  PLT 284 236 245   Basic Metabolic Panel Recent Labs    81/82/99 0252 10/15/23 1543 10/16/23 0521  NA  134* 136 137  K 3.1* 3.6 2.6*  CL 96* 96* 104  CO2 25 29 26   GLUCOSE 126* 138* 101*  BUN 60* 55* 46*  CREATININE 1.96* 1.73* 1.49*  CALCIUM 8.8* 9.2 7.2*  MG 2.5*  --  1.9   Liver Function Tests Recent Labs    10/13/23 1830  AST 28  ALT 17  ALKPHOS 91  BILITOT 1.6*  PROT 7.6  ALBUMIN 3.9   No results for input(s): "LIPASE", "AMYLASE" in the last 72 hours. Cardiac Enzymes No results for input(s): "CKTOTAL", "CKMB", "CKMBINDEX", "TROPONINI" in the last 72 hours.  BNP: BNP (last 3 results) Recent Labs    09/03/23 0958 10/10/23 0249 10/13/23 1830  BNP 2,348.4* 3,459.8* >4,500.0*    ProBNP (last 3 results) No results for input(s): "PROBNP" in the last 8760 hours.   D-Dimer No results for input(s): "DDIMER" in the last 72 hours. Hemoglobin A1C No results for input(s): "HGBA1C" in the last 72 hours. Fasting Lipid Panel No results for input(s): "CHOL", "HDL", "LDLCALC", "TRIG", "CHOLHDL", "LDLDIRECT" in the last 72 hours. Thyroid Function Tests No results for input(s): "TSH", "T4TOTAL", "T3FREE", "THYROIDAB" in the last 72 hours.  Invalid input(s): "FREET3"  Other results:   Imaging    No results found.   Medications:  Scheduled Medications:  aspirin EC  81 mg Oral Daily   atorvastatin  40 mg Oral Daily   Chlorhexidine Gluconate Cloth  6 each Topical Q0600   dapagliflozin propanediol  10 mg Oral Daily   enoxaparin (LOVENOX) injection  40 mg Subcutaneous Q24H   famotidine  20 mg Oral BID   folic acid  1 mg Oral Daily   isosorbide-hydrALAZINE  0.5 tablet Oral TID   metolazone  5 mg Oral Once   mirtazapine  7.5 mg Oral QHS   multivitamin with minerals  1 tablet Oral Daily   potassium chloride  40 mEq Oral Q4H   sodium chloride flush  10-40 mL Intracatheter Q12H   spironolactone  25 mg Oral Daily   [START ON 10/19/2023] thiamine  100 mg Oral Daily    Infusions:  amiodarone 30 mg/hr (10/16/23 0657)   furosemide (LASIX) 200 mg in dextrose 5 % 100  mL (2 mg/mL) infusion 20 mg/hr (10/16/23 0648)   milrinone 0.25 mcg/kg/min (10/16/23 0648)   potassium chloride Stopped (10/16/23 0729)   potassium chloride     thiamine (VITAMIN B1) injection Stopped (10/16/23 0602)    PRN Medications: acetaminophen, LORazepam **OR** LORazepam, mouth rinse, sodium chloride flush   Assessment/Plan   1. Acute on chronic systolic CHF: Mixed ischemic/nonischemic cardiomyopathy. He has a history of alcohol and cocaine abuse, which likely contributes to cardiomyopathy.  LHC in 7/20 and again in 5/23 showed RCA chronic occlusion. RHC in 5/23 showed low output with CI 1.53 and markedly elevated filling pressures.  Most recent echo in 7/24 showed EF < 20%, no LV thrombus, moderately decreased RV systolic function. He is readmitted with marked volume overload, hypoxemia, and NYHA class IIIb-IV symptoms.  Creatinine up to 2.1 initially though lactate normal at 1.8.  Based on past evaluation, suspect low output HF.  He is now on milrinone 0.25 + Lasix gtt 20 mg/hr + metolazone with good diuresis yesterday, weight down 12 lbs. Co-ox 65%. Creatinine trending down at 1.49. On exam, he remains volume overloaded, CVP still high at 17.  Breathing better.   - Continue milrinone 0.25 mcg/kg/min to aide with diuresis.  He is not a candidate for home milrinone with substance abuse and functional homelessness.  - Continue Lasix 20 mg/hr and will give a dose of metolazone 5 mg x 1 again today.   - Aggressive K replacement, check BMET in pm.  - Can restart digoxin with creatinine down to 1.49.  - Continue spironolactone 25 mg daily.  - Continue home Farxiga 10 mg daily.  - Off Entresto due to hypotension and AKI in past.  - Can start Bidil 0.5 tab tid. - Repeat echo to make sure no LV thrombus.  - Not candidate for ICD unless he can cut back on ETOH abuse.  He has stopped cocaine. Narrow QRS so no CRT.  - Not an advanced therapies candidate with substance abuse, poor compliance, and  living situation (basically homeless, lives with friends or in hotels). Would also be poor home milrinone candidate.  We discussed end stage HF diagnosis, says he "just wants to get the fluid off" and not ready to commit to DNR.  Palliative care following.  2. CAD: LHC in 7/20 and 5/23 showed CTO of RCA treated medically, left system ok. Suspect cardiomyopathy is primarily nonischemic. No chest pain. - Continue ASA 81 daily + atorvastatin 40 mg daily.  3. Cocaine Use Disorder: Has been off cocaine for > 2 years now.  4. Alcohol Use  Disorder: Still drinking heavily at times, encouraged to continue to cut back even more 5. AKI on CKD stage 3: Baseline SCr 1.8, now up to 2.1 at admission, suspect cardiorenal syndrome in setting of end stage HF.  Creatinine trending down today at 1.49.  - Support CO with milrinone.  - Continue diuresis.  6. NSVT: With milrinone and hypokalemia.  - Replace K.  - Will keep him on IV amiodarone while on milrinone, stop after he is titrated off.   Length of Stay: 2  Marca Ancona, MD  10/16/2023, 7:38 AM  Advanced Heart Failure Team Pager [phone] (M-F; 7a - 5p)  Please contact [facility] for night-coverage after hours (5p -7a ) and weekends on [email or web address]

## 2023-10-17 DIAGNOSIS — Z7189 Other specified counseling: Secondary | ICD-10-CM | POA: Diagnosis not present

## 2023-10-17 DIAGNOSIS — I5023 Acute on chronic systolic (congestive) heart failure: Secondary | ICD-10-CM | POA: Diagnosis not present

## 2023-10-17 DIAGNOSIS — N179 Acute kidney failure, unspecified: Secondary | ICD-10-CM | POA: Diagnosis not present

## 2023-10-17 DIAGNOSIS — Z515 Encounter for palliative care: Secondary | ICD-10-CM | POA: Diagnosis not present

## 2023-10-17 LAB — BASIC METABOLIC PANEL
Anion gap: 10 (ref 5–15)
BUN: 52 mg/dL — ABNORMAL HIGH (ref 6–20)
CO2: 32 mmol/L (ref 22–32)
Calcium: 8.9 mg/dL (ref 8.9–10.3)
Chloride: 89 mmol/L — ABNORMAL LOW (ref 98–111)
Creatinine, Ser: 2.24 mg/dL — ABNORMAL HIGH (ref 0.61–1.24)
GFR, Estimated: 33 mL/min — ABNORMAL LOW (ref >60)
Glucose, Bld: 218 mg/dL — ABNORMAL HIGH (ref 70–99)
Potassium: 4.4 mmol/L (ref 3.5–5.1)
Sodium: 131 mmol/L — ABNORMAL LOW (ref 135–145)

## 2023-10-17 LAB — COOXEMETRY PANEL
Carboxyhemoglobin: 1.2 % (ref 0.5–1.5)
Methemoglobin: <0.7 % (ref 0.0–1.5)
O2 Saturation: 72.8 %
Total hemoglobin: 13.3 g/dL (ref 12.0–16.0)

## 2023-10-17 LAB — RENAL FUNCTION PANEL
Albumin: 3.2 g/dL — ABNORMAL LOW (ref 3.5–5.0)
Anion gap: 11 (ref 5–15)
BUN: 57 mg/dL — ABNORMAL HIGH (ref 6–20)
CO2: 30 mmol/L (ref 22–32)
Calcium: 8.4 mg/dL — ABNORMAL LOW (ref 8.9–10.3)
Chloride: 87 mmol/L — ABNORMAL LOW (ref 98–111)
Creatinine, Ser: 2.42 mg/dL — ABNORMAL HIGH (ref 0.61–1.24)
GFR, Estimated: 30 mL/min — ABNORMAL LOW (ref >60)
Glucose, Bld: 155 mg/dL — ABNORMAL HIGH (ref 70–99)
Phosphorus: 4.6 mg/dL (ref 2.5–4.6)
Potassium: 3.6 mmol/L (ref 3.5–5.1)
Sodium: 128 mmol/L — ABNORMAL LOW (ref 135–145)

## 2023-10-17 LAB — MAGNESIUM: Magnesium: 2.6 mg/dL — ABNORMAL HIGH (ref 1.7–2.4)

## 2023-10-17 LAB — CBC
HCT: 37.1 % — ABNORMAL LOW (ref 39.0–52.0)
Hemoglobin: 12.8 g/dL — ABNORMAL LOW (ref 13.0–17.0)
MCH: 31.1 pg (ref 26.0–34.0)
MCHC: 34.5 g/dL (ref 30.0–36.0)
MCV: 90 fL (ref 80.0–100.0)
Platelets: 242 10*3/uL (ref 150–400)
RBC: 4.12 MIL/uL — ABNORMAL LOW (ref 4.22–5.81)
RDW: 14.7 % (ref 11.5–15.5)
WBC: 7.2 10*3/uL (ref 4.0–10.5)
nRBC: 0 % (ref 0.0–0.2)

## 2023-10-17 MED ORDER — SPIRONOLACTONE 12.5 MG HALF TABLET
12.5000 mg | ORAL_TABLET | Freq: Every day | ORAL | Status: DC
  Filled 2023-10-17: qty 1

## 2023-10-17 MED ORDER — AMIODARONE HCL 200 MG PO TABS
200.0000 mg | ORAL_TABLET | Freq: Two times a day (BID) | ORAL | Status: DC
  Administered 2023-10-17 – 2023-10-18 (×4): 200 mg via ORAL
  Filled 2023-10-17 (×4): qty 1

## 2023-10-17 MED ORDER — CLONAZEPAM 0.5 MG PO TABS
0.5000 mg | ORAL_TABLET | Freq: Every day | ORAL | Status: DC
  Administered 2023-10-18: 0.5 mg via ORAL
  Filled 2023-10-17: qty 1

## 2023-10-17 MED ORDER — CLONAZEPAM 1 MG PO TABS
1.0000 mg | ORAL_TABLET | Freq: Every day | ORAL | Status: DC
  Administered 2023-10-17: 1 mg via ORAL
  Filled 2023-10-17: qty 1

## 2023-10-17 NOTE — Progress Notes (Signed)
[facility] Progress Note Patient: Dale [name] XBJ:[id number] DOB: 02-Mar-1961 DOA: 10/13/2023  DOS: the patient was seen and examined on 10/17/2023  Brief hospital course: Chao Wiltgen is a 62 y.o. male with medical history significant of HFrEF (EF < 20%) in setting of ICM with chronically occluded RCA as well as prior cocaine abuse and EtOH abuse.  Cardiology following. PICC line ordered. On Primacor.  Renal function worsening therefore Lasix is discontinued.  Assessment and Plan: Acute on chronic systolic CHF. NICM. Alcohol and cocaine abuse. Appreciate cardiology consultation. PICC line ordered. CVP remains elevated.  More On Primacor  Was also on amiodarone and Lasix drip. Hold Lasix.  Continue Aldactone and amiodarone. Not a candidate for any aggressive or long-term therapy. Prognosis is poor.  Nonobstructive CAD. Being treated medically. No chest pain. Aspirin and Lipitor.  History of cocaine use. Currently does not abuse. Monitor.  Ongoing alcohol abuse disorder. Monitor for withdrawal.  Acute kidney injury on CKD stage IIIb. Suspect cardiorenal hemodynamics. Initially renal function was improving.  Now worsening again.  Lasix is on hold. Baseline serum creatinine around 1.4-1.7.  On admission serum creatinine 2.09.  Improved to 1.4 and now worsening again to 2.2.  Hypokalemia as well as hypocalcemia. Replaced.  Bilateral leg redness and swelling. Has been refusing DVT prophylaxis in the hospital. Lower EXTR Doppler has not negative. On notes ordered.  Acute respiratory failure with hypoxia. Required BiPAP. Currently on oxygen. Improving.  Suicidal ideation. Psychiatry consulted. Remeron added.  Now discontinued.  Psychiatry recommended to initiate scheduled Ativan.  Which is also now discontinued and now on clonazepam. On IV thiamine.  Psych will continue to follow.  Obesity Class 3 Body mass index is 31.95 kg/m.  Placing the pt  at higher risk of poor outcomes.   Capacity concern. With his ongoing psychiatric issues unsure if the patient actually has capacity to understand his medical condition and navigate complex medical decision making. Patient in the past has provided daughter [name] but currently wants to include his mother as well in  the decision making.  It appears that he lives with his mother.  Will discuss with family as well with regards to his goals of care.  Subjective: Denies any acute complaint.  Was sleeping but was easily awakened.  Denies any nausea vomiting or abdominal pain.  No chest pain.  Only pain that he reports is in his toe. Wants to eat a sandwich as soon as I woke him!  Physical Exam: In no distress. Faint basal crackles heart. Bowel sound present S1-S2 present. Improving lower extremity edema.  Data Reviewed: I have Reviewed nursing notes, Vitals, and Lab results. Reviewed CBC and CMP.  Reordered CBC and BMP.   Disposition: Status is: Inpatient Remains inpatient appropriate because: Monitor for improvement in volume status  enoxaparin (LOVENOX) injection 40 mg Start: 10/15/23 1000   Family Communication: No one at bedside Level of care: ICU continue ICU monitoring Vitals:   10/17/23 1400 10/17/23 1500 10/17/23 1600 10/17/23 1747  BP: 95/66 114/72 131/87 119/84  Pulse: 95 88 85 97  Resp: (!) 36 (!) 29 (!) 26   Temp:   97.9 F (36.6 C)   TempSrc:   Oral   SpO2: 93% 92% 95%   Weight:      Height:         Author: Lynden Oxford, MD 10/17/2023 6:21 PM  Please look on [email or web address] to find out who is on call.

## 2023-10-17 NOTE — Progress Notes (Addendum)
Patient ID: Dale [name], male   DOB: July 16, 1961, 62 y.o.   MRN: [id number]      Advanced Heart Failure Rounding Note  PCP-Cardiologist: Marca Ancona, MD   Subjective:    Good diuresis on Lasix gtt 20 mg/hr + metolazone 5 x 1, weight down 5 lbs. CVP now 9.  He is on milrinone 0.25 mcg/kg/min with co-ox 73%.  Creatinine has jumped up to 2.24.   He says that his breathing is better.     Objective:   Weight Range: 103.9 kg Body mass index is 31.95 kg/m.   Vital Signs:   Temp:  [97.6 F (36.4 C)-98.2 F (36.8 C)] 98.1 F (36.7 C) (12/13 0400) Pulse Rate:  [52-102] 85 (12/13 0703) Resp:  [14-30] 21 (12/13 0703) BP: (96-180)/(57-152) 106/61 (12/13 0703) SpO2:  [86 %-100 %] 93 % (12/13 0703) Weight:  [103.9 kg] 103.9 kg (12/13 0500) Last BM Date : 10/16/23  Weight change: Filed Weights   10/15/23 0500 10/16/23 0515 10/17/23 0500  Weight: 111.8 kg 106.1 kg 103.9 kg    Intake/Output:   Intake/Output Summary (Last 24 hours) at 10/17/2023 0739 Last data filed at 10/17/2023 0720 Gross per 24 hour  Intake 2399.94 ml  Output 7480 ml  Net -5080.06 ml      Physical Exam    General: NAD Neck: JVP 8 cm, no thyromegaly or thyroid nodule.  Lungs: Clear to auscultation bilaterally with normal respiratory effort. CV: Lateral PMI.  Heart regular S1/S2, no S3/S4, no murmur.  No peripheral edema, unna boots present.  Abdomen: Soft, nontender, no hepatosplenomegaly, no distention.  Skin: Intact without lesions or rashes.  Neurologic: Alert and oriented x 3.  Psych: Normal affect. Extremities: No clubbing or cyanosis.  HEENT: Normal.   Telemetry   NSR 80s with occasional PVCs (personally reviewed)  Labs    CBC Recent Labs    10/16/23 0521 10/17/23 0354  WBC 7.1 7.2  HGB 12.7* 12.8*  HCT 37.5* 37.1*  MCV 90.6 90.0  PLT 245 242   Basic Metabolic Panel Recent Labs    62/13/08 0521 10/16/23 1716 10/17/23 0354  NA 137 133* 131*  K 2.6* 4.3 4.4  CL 104 90*  89*  CO2 26 34* 32  GLUCOSE 101* 146* 218*  BUN 46* 53* 52*  CREATININE 1.49* 2.02* 2.24*  CALCIUM 7.2* 9.5 8.9  MG 1.9  --  2.6*  PHOS  --  3.8  --    Liver Function Tests Recent Labs    10/16/23 1716  ALBUMIN 3.6   No results for input(s): "LIPASE", "AMYLASE" in the last 72 hours. Cardiac Enzymes No results for input(s): "CKTOTAL", "CKMB", "CKMBINDEX", "TROPONINI" in the last 72 hours.  BNP: BNP (last 3 results) Recent Labs    09/03/23 0958 10/10/23 0249 10/13/23 1830  BNP 2,348.4* 3,459.8* >4,500.0*    ProBNP (last 3 results) No results for input(s): "PROBNP" in the last 8760 hours.   D-Dimer No results for input(s): "DDIMER" in the last 72 hours. Hemoglobin A1C No results for input(s): "HGBA1C" in the last 72 hours. Fasting Lipid Panel No results for input(s): "CHOL", "HDL", "LDLCALC", "TRIG", "CHOLHDL", "LDLDIRECT" in the last 72 hours. Thyroid Function Tests No results for input(s): "TSH", "T4TOTAL", "T3FREE", "THYROIDAB" in the last 72 hours.  Invalid input(s): "FREET3"  Other results:   Imaging    VAS [place] LOWER EXTREMITY VENOUS (DVT) Result Date: 10/16/2023  Lower Venous DVT Study Patient Name:  JOSIYA JUNE  Date of Exam:  10/16/2023 Medical Rec #: [id number]            Accession #:    [id number] Date of Birth: 1961-08-21            Patient Gender: M Patient Age:   18 years Exam Location:  [facility] Procedure:      VAS [place] LOWER EXTREMITY VENOUS (DVT) Referring Phys: PRANAV PATEL --------------------------------------------------------------------------------  Indications: Edema.  Risk Factors: None identified. Limitations: Poor ultrasound/tissue interface. Comparison Study: No prior studies. Performing Technologist: Chanda Busing RVT  Examination Guidelines: A complete evaluation includes B-mode imaging, spectral Doppler, color Doppler, and power Doppler as needed of all accessible portions of each vessel. Bilateral testing is considered  an integral part of a complete examination. Limited examinations for reoccurring indications may be performed as noted. The reflux portion of the exam is performed with the patient in reverse Trendelenburg.  +---------+---------------+---------+-----------+----------+--------------+ RIGHT    CompressibilityPhasicitySpontaneityPropertiesThrombus Aging +---------+---------------+---------+-----------+----------+--------------+ CFV      Full           Yes      Yes                                 +---------+---------------+---------+-----------+----------+--------------+ SFJ      Full                                                        +---------+---------------+---------+-----------+----------+--------------+ FV Prox  Full                                                        +---------+---------------+---------+-----------+----------+--------------+ FV Mid   Full                                                        +---------+---------------+---------+-----------+----------+--------------+ FV DistalFull                                                        +---------+---------------+---------+-----------+----------+--------------+ PFV      Full                                                        +---------+---------------+---------+-----------+----------+--------------+ POP      Full           Yes      Yes                                 +---------+---------------+---------+-----------+----------+--------------+ PTV      Full                                                        +---------+---------------+---------+-----------+----------+--------------+  PERO     Full                                                        +---------+---------------+---------+-----------+----------+--------------+   +---------+---------------+---------+-----------+----------+-------------------+ LEFT      CompressibilityPhasicitySpontaneityPropertiesThrombus Aging      +---------+---------------+---------+-----------+----------+-------------------+ CFV      Full           Yes      Yes                                      +---------+---------------+---------+-----------+----------+-------------------+ SFJ      Full                                                             +---------+---------------+---------+-----------+----------+-------------------+ FV Prox  Full                                                             +---------+---------------+---------+-----------+----------+-------------------+ FV Mid   Full                                                             +---------+---------------+---------+-----------+----------+-------------------+ FV DistalFull           Yes      Yes                                      +---------+---------------+---------+-----------+----------+-------------------+ PFV      Full                                                             +---------+---------------+---------+-----------+----------+-------------------+ POP      Full           Yes      Yes                                      +---------+---------------+---------+-----------+----------+-------------------+ PTV      Full                                                             +---------+---------------+---------+-----------+----------+-------------------+ PERO  Not well visualized +---------+---------------+---------+-----------+----------+-------------------+     Summary: RIGHT: - There is no evidence of deep vein thrombosis in the lower extremity.  - No cystic structure found in the popliteal fossa.  LEFT: - There is no evidence of deep vein thrombosis in the lower extremity. However, portions of this examination were limited- see technologist comments above.  - No cystic structure found in  the popliteal fossa.  *See table(s) above for measurements and observations. Electronically signed by Carolynn Sayers on 10/16/2023 at 1:50:05 PM.    Final    ECHOCARDIOGRAM COMPLETE Result Date: 10/16/2023    ECHOCARDIOGRAM REPORT   Patient Name:   CAROS MERGEL Date of Exam: 10/16/2023 Medical Rec #:  [id number]           Height:       71.0 in Accession #:    [id number]          Weight:       233.9 lb Date of Birth:  18-Aug-1961           BSA:          2.254 m Patient Age:    62 years            BP:           153/119 mmHg Patient Gender: M                   HR:           91 bpm. Exam Location:  Inpatient Procedure: 2D Echo, Cardiac Doppler and Color Doppler Indications:    CHF I50.21  History:        Patient has prior history of Echocardiogram examinations, most                 recent 02/19/2022. Ischemic CMO, CAD; Risk Factors:Hypertension                 and Dyslipidemia.  Sonographer:    Harriette Bouillon RDCS Referring Phys: [phone] Dontarius Sheley S Savhanna Sliva IMPRESSIONS  1. Left ventricular ejection fraction, by estimation, is <20%. The left ventricle has severely decreased function. The left ventricle demonstrates global hypokinesis. The left ventricular internal cavity size was severely dilated. Left ventricular diastolic parameters are consistent with Grade II diastolic dysfunction (pseudonormalization).  2. Right ventricular systolic function is moderately reduced. The right ventricular size is severely enlarged. There is normal pulmonary artery systolic pressure.  3. Left atrial size was severely dilated.  4. Right atrial size was severely dilated.  5. The mitral valve is normal in structure. Mild mitral valve regurgitation. No evidence of mitral stenosis.  6. The aortic valve is tricuspid. Aortic valve regurgitation is trivial. No aortic stenosis is present.  7. The inferior vena cava is dilated in size with >50% respiratory variability, suggesting right atrial pressure of 8 mmHg. Comparison(s): Changes from prior  study are noted. Both RV and LV have dilated further compared to prior studies. Mild functional MR and TR. FINDINGS  Left Ventricle: Left ventricular ejection fraction, by estimation, is <20%. The left ventricle has severely decreased function. The left ventricle demonstrates global hypokinesis. The left ventricular internal cavity size was severely dilated. There is no left ventricular hypertrophy. Left ventricular diastolic parameters are consistent with Grade II diastolic dysfunction (pseudonormalization). Right Ventricle: The right ventricular size is severely enlarged. No increase in right ventricular wall thickness. Right ventricular systolic function is moderately reduced. There is normal pulmonary artery systolic pressure. The tricuspid regurgitant velocity is 2.24  m/s, and with an assumed right atrial pressure of 8 mmHg, the estimated right ventricular systolic pressure is 28.1 mmHg. Left Atrium: Left atrial size was severely dilated. Right Atrium: Right atrial size was severely dilated. Pericardium: There is no evidence of pericardial effusion. Mitral Valve: The mitral valve is normal in structure. Mild mitral valve regurgitation. No evidence of mitral valve stenosis. Tricuspid Valve: The tricuspid valve is normal in structure. Tricuspid valve regurgitation is mild . No evidence of tricuspid stenosis. Aortic Valve: The aortic valve is tricuspid. Aortic valve regurgitation is trivial. No aortic stenosis is present. Pulmonic Valve: The pulmonic valve was grossly normal. Pulmonic valve regurgitation is trivial. No evidence of pulmonic stenosis. Aorta: The aortic root, ascending aorta, aortic arch and descending aorta are all structurally normal, with no evidence of dilitation or obstruction. Venous: The inferior vena cava is dilated in size with greater than 50% respiratory variability, suggesting right atrial pressure of 8 mmHg. IAS/Shunts: The atrial septum is grossly normal.  LEFT VENTRICLE PLAX 2D LVIDd:          9.30 cm      Diastology LVIDs:         8.60 cm      LV e' medial:    4.13 cm/s LV PW:         0.90 cm      LV E/e' medial:  23.0 LV IVS:        0.80 cm      LV e' lateral:   5.87 cm/s LVOT diam:     2.30 cm      LV E/e' lateral: 16.2 LV SV:         54 LV SV Index:   24 LVOT Area:     4.15 cm  LV Volumes (MOD) LV vol d, MOD A2C: 519.0 ml LV vol d, MOD A4C: 401.0 ml LV vol s, MOD A2C: 406.0 ml LV vol s, MOD A4C: 317.0 ml LV SV MOD A2C:     113.0 ml LV SV MOD A4C:     401.0 ml LV SV MOD BP:      106.6 ml RIGHT VENTRICLE            IVC RV Basal diam:  5.41 cm    IVC diam: 2.30 cm RV S prime:     7.51 cm/s TAPSE (M-mode): 1.2 cm LEFT ATRIUM            Index LA diam:      5.80 cm  2.57 cm/m LA Vol (A4C): 152.0 ml 67.45 ml/m  AORTIC VALVE LVOT Vmax:   84.40 cm/s LVOT Vmean:  55.800 cm/s LVOT VTI:    0.131 m  AORTA Ao Root diam: 3.50 cm MITRAL VALVE               TRICUSPID VALVE MV Area (PHT): 7.74 cm    TR Peak grad:   20.1 mmHg MV Decel Time: 98 msec     TR Vmax:        224.00 cm/s MV E velocity: 95.10 cm/s MV A velocity: 58.60 cm/s  SHUNTS MV E/A ratio:  1.62        Systemic VTI:  0.13 m                            Systemic Diam: 2.30 cm Jodelle Red MD Electronically signed by Jodelle Red MD Signature Date/Time: 10/16/2023/11:51:58 AM    Final  Medications:     Scheduled Medications:  amiodarone  200 mg Oral BID   aspirin EC  81 mg Oral Daily   atorvastatin  40 mg Oral Daily   Chlorhexidine Gluconate Cloth  6 each Topical Q0600   dapagliflozin propanediol  10 mg Oral Daily   enoxaparin (LOVENOX) injection  40 mg Subcutaneous Q24H   famotidine  20 mg Oral BID   folic acid  1 mg Oral Daily   isosorbide-hydrALAZINE  0.5 tablet Oral TID   LORazepam  0.5 mg Oral q AM   And   LORazepam  1 mg Oral QHS   mirtazapine  7.5 mg Oral QHS   multivitamin with minerals  1 tablet Oral Daily   sodium chloride flush  10-40 mL Intracatheter Q12H   spironolactone  12.5 mg Oral Daily    [START ON 10/19/2023] thiamine  100 mg Oral Daily    Infusions:  milrinone 0.25 mcg/kg/min (10/17/23 0600)   thiamine (VITAMIN B1) injection Stopped (10/17/23 0559)    PRN Medications: acetaminophen, LORazepam **OR** LORazepam, mouth rinse, sodium chloride flush   Assessment/Plan   1. Acute on chronic systolic CHF: Mixed ischemic/nonischemic cardiomyopathy. He has a history of alcohol and cocaine abuse, which likely contributes to cardiomyopathy.  LHC in 7/20 and again in 5/23 showed RCA chronic occlusion. RHC in 5/23 showed low output with CI 1.53 and markedly elevated filling pressures.  Echo this admission with EF < 20%, severe LV dilation, no LV thrombus noted, moderate RV dysfunction, mild MR. He is readmitted with marked volume overload, hypoxemia, and NYHA class IIIb-IV symptoms.  Based on past evaluation, suspect low output HF.  He is now on milrinone 0.25 + Lasix gtt 20 mg/hr + metolazone with good diuresis yesterday, weight down 5 lbs. Co-ox 73%. Creatinine jumped up significantly over the last day.  SPB 100s-110s. Volume status improved, CVP now 9. Breathing better.   - Continue milrinone 0.25 until renal function stabilizes.  He is not a candidate for home milrinone with substance abuse and functional homelessness.  - Stop Lasix gtt/metolazone.  Will need to start po torsemide when creatinine stabilizes.   - Hold digoxin with rise in creatinine.  - Hold spironolactone with rise in creatinine.  - Continue home Farxiga 10 mg daily.  - Off Entresto due to hypotension and AKI in past.  - Continue Bidil 0.5 tab tid. - Not candidate for ICD unless he can cut back on ETOH abuse.  He has stopped cocaine. Narrow QRS so no CRT.  - Not an advanced therapies candidate with substance abuse, poor compliance, and living situation (basically homeless, lives with friends or in hotels). Would also be poor home milrinone candidate.  We discussed end stage HF diagnosis, says he "just wants to get the  fluid off" and not ready to commit to DNR.  Palliative care following.  2. CAD: LHC in 7/20 and 5/23 showed CTO of RCA treated medically, left system ok. Suspect cardiomyopathy is primarily nonischemic. No chest pain. - Continue ASA 81 daily + atorvastatin 40 mg daily.  3. Cocaine Use Disorder: Has been off cocaine for > 2 years now.  4. Alcohol Use Disorder: Still drinking heavily at times, encouraged to continue to cut back even more 5. AKI on CKD stage 3: Baseline SCr 1.8, now up to 2.1 at admission, suspect cardiorenal syndrome in setting of end stage HF.  Creatinine trended higher today to 2.2.  - Support CO with milrinone.  - Stop Lasix.   6.  NSVT: With milrinone and hypokalemia. K is normal today.  - Transition amiodarone gtt to po, stop amiodarone when milrinone is stopped.  7. Suicidal ideation: Psychiatry following.   Mobilize.   Length of Stay: 3  Marca Ancona, MD  10/17/2023, 7:39 AM  Advanced Heart Failure Team Pager [phone] (M-F; 7a - 5p)  Please contact [facility] for night-coverage after hours (5p -7a ) and weekends on [email or web address]

## 2023-10-17 NOTE — Progress Notes (Signed)
  Daily Progress Note   Patient Name: Dale [name]       Date: 10/17/2023 DOB: 1961/10/11  Age: 62 y.o. MRN#: [id number] Attending Physician: Rolly Salter, MD Primary Care Physician: Pcp, No Admit Date: 10/13/2023 Length of Stay: 3 days  Reason for Consultation/Follow-up: {Reason for Consult:23484}  HPI/Patient Profile:  ***  Subjective:   Subjective: Chart Reviewed. Updates received. Patient Assessed. Created space and opportunity for patient  and family to explore thoughts and feelings regarding current medical situation.  Today's Discussion: ***  Review of Systems  Objective:   Vital Signs:  BP (!) 105/31   Pulse 86   Temp 97.7 F (36.5 C) (Axillary)   Resp (!) 27   Ht 5\' 11"  (1.803 m)   Wt 103.9 kg   SpO2 99%   BMI 31.95 kg/m   Physical Exam  Palliative Assessment/Data: ***    Existing Vynca/ACP Documentation: ***  Assessment & Plan:   Impression: Present on Admission:  Acute on chronic HFrEF (heart failure with reduced ejection fraction) (HCC)  CKD (chronic kidney disease), stage III (HCC)  Acute kidney injury superimposed on chronic kidney disease (HCC)  Acute respiratory failure with hypoxia (HCC)  Acute on chronic systolic CHF (congestive heart failure) (HCC)  ***  SUMMARY OF RECOMMENDATIONS   ***  Symptom Management:  ***  Code Status: {Palliative Code status:23503}  Prognosis: {Palliative Care Prognosis:23504}  Discharge Planning: {Palliative dispostion:23505}  Discussed with: ***  Thank you for allowing [place] to participate in the care of Ignac Eckstein PMT will continue to support holistically.  Time Total: ***  Detailed review of medical records (labs, imaging, vital signs), medically appropriate exam, discussed with treatment team, counseling and education to patient, family, & staff, documenting clinical information, medication management, coordination of care  Wynne Dust, NP Palliative Medicine Team  Team Phone #  [phone] (Nights/Weekends)  07/03/2021, 8:17 AM

## 2023-10-17 NOTE — Progress Notes (Signed)
   10/17/23 2012  BiPAP/CPAP/SIPAP  Reason BIPAP/CPAP not in use Other(comment) (no need of bipap at this time. No resp distress, pt resting)  BiPAP/CPAP /SiPAP Vitals  Pulse Rate 91  Resp (!) 25  SpO2 (!) 88 %  MEWS Score/Color  MEWS Score 1  MEWS Score Color Green

## 2023-10-17 NOTE — TOC Progression Note (Signed)
Transition of Care [facility]) - Progression Note    Patient Details  Name: Dale [name] MRN: [id number] Date of Birth: 1961/07/16  Transition of Care [facility]) CM/SW Contact  Adrian Prows, RN Phone Number: 10/17/2023, 3:21 PM  Clinical Narrative:    Pt asleep in room; no family at bedside; unable to complete [facility] assessment        Expected Discharge Plan and Services                                               Social Determinants of Health (SDOH) Interventions SDOH Screenings   Food Insecurity: Patient Unable To Answer (10/14/2023)  Recent Concern: Food Insecurity - Food Insecurity Present (07/21/2023)  Housing: Medium Risk (10/14/2023)  Transportation Needs: Unmet Transportation Needs (10/14/2023)  Utilities: Patient Unable To Answer (10/14/2023)  Alcohol Screen: High Risk (02/20/2022)  Depression (PHQ2-9): Medium Risk (05/05/2019)  Financial Resource Strain: High Risk (08/25/2023)  Tobacco Use: Low Risk  (10/14/2023)    Readmission Risk Interventions    02/18/2023   12:36 PM  Readmission Risk Prevention Plan  Transportation Screening Complete  PCP or Specialist Appt within 3-5 Days Complete  HRI or Home Care Consult Complete  Social Work Consult for Recovery Care Planning/Counseling Complete  Palliative Care Screening Not Applicable  Medication Review Ocean[employer]) Complete

## 2023-10-17 NOTE — Plan of Care (Signed)
  Problem: Education: Goal: Knowledge of General Education information will improve Description: Including pain rating scale, medication(s)/side effects and non-pharmacologic comfort measures Outcome: Not Progressing   Problem: Health Behavior/Discharge Planning: Goal: Ability to manage health-related needs will improve Outcome: Not Progressing   Problem: Coping: Goal: Level of anxiety will decrease Outcome: Not Progressing   
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
## 2023-10-18 DIAGNOSIS — F1411 Cocaine abuse, in remission: Secondary | ICD-10-CM

## 2023-10-18 DIAGNOSIS — I5023 Acute on chronic systolic (congestive) heart failure: Secondary | ICD-10-CM | POA: Diagnosis not present

## 2023-10-18 DIAGNOSIS — Z515 Encounter for palliative care: Secondary | ICD-10-CM | POA: Diagnosis not present

## 2023-10-18 DIAGNOSIS — N179 Acute kidney failure, unspecified: Secondary | ICD-10-CM | POA: Diagnosis not present

## 2023-10-18 DIAGNOSIS — I2510 Atherosclerotic heart disease of native coronary artery without angina pectoris: Secondary | ICD-10-CM | POA: Diagnosis not present

## 2023-10-18 DIAGNOSIS — I4729 Other ventricular tachycardia: Secondary | ICD-10-CM | POA: Diagnosis not present

## 2023-10-18 DIAGNOSIS — Z7189 Other specified counseling: Secondary | ICD-10-CM | POA: Diagnosis not present

## 2023-10-18 LAB — RENAL FUNCTION PANEL
Albumin: 3.2 g/dL — ABNORMAL LOW (ref 3.5–5.0)
Anion gap: 9 (ref 5–15)
BUN: 52 mg/dL — ABNORMAL HIGH (ref 6–20)
CO2: 30 mmol/L (ref 22–32)
Calcium: 8.4 mg/dL — ABNORMAL LOW (ref 8.9–10.3)
Chloride: 91 mmol/L — ABNORMAL LOW (ref 98–111)
Creatinine, Ser: 1.92 mg/dL — ABNORMAL HIGH (ref 0.61–1.24)
GFR, Estimated: 39 mL/min — ABNORMAL LOW (ref >60)
Glucose, Bld: 108 mg/dL — ABNORMAL HIGH (ref 70–99)
Phosphorus: 3.3 mg/dL (ref 2.5–4.6)
Potassium: 3.7 mmol/L (ref 3.5–5.1)
Sodium: 130 mmol/L — ABNORMAL LOW (ref 135–145)

## 2023-10-18 LAB — CBC
HCT: 38.7 % — ABNORMAL LOW (ref 39.0–52.0)
Hemoglobin: 13.2 g/dL (ref 13.0–17.0)
MCH: 30.8 pg (ref 26.0–34.0)
MCHC: 34.1 g/dL (ref 30.0–36.0)
MCV: 90.4 fL (ref 80.0–100.0)
Platelets: 247 10*3/uL (ref 150–400)
RBC: 4.28 MIL/uL (ref 4.22–5.81)
RDW: 14.6 % (ref 11.5–15.5)
WBC: 6.2 10*3/uL (ref 4.0–10.5)
nRBC: 0 % (ref 0.0–0.2)

## 2023-10-18 LAB — BASIC METABOLIC PANEL
Anion gap: 10 (ref 5–15)
Anion gap: 7 (ref 5–15)
BUN: 56 mg/dL — ABNORMAL HIGH (ref 6–20)
BUN: 50 mg/dL — ABNORMAL HIGH (ref 6–20)
CO2: 31 mmol/L (ref 22–32)
CO2: 31 mmol/L (ref 22–32)
Calcium: 8.4 mg/dL — ABNORMAL LOW (ref 8.9–10.3)
Calcium: 8.5 mg/dL — ABNORMAL LOW (ref 8.9–10.3)
Chloride: 88 mmol/L — ABNORMAL LOW (ref 98–111)
Chloride: 91 mmol/L — ABNORMAL LOW (ref 98–111)
Creatinine, Ser: 2.31 mg/dL — ABNORMAL HIGH (ref 0.61–1.24)
Creatinine, Ser: 1.92 mg/dL — ABNORMAL HIGH (ref 0.61–1.24)
GFR, Estimated: 32 mL/min — ABNORMAL LOW (ref >60)
GFR, Estimated: 39 mL/min — ABNORMAL LOW (ref >60)
Glucose, Bld: 126 mg/dL — ABNORMAL HIGH (ref 70–99)
Glucose, Bld: 105 mg/dL — ABNORMAL HIGH (ref 70–99)
Potassium: 3.9 mmol/L (ref 3.5–5.1)
Potassium: 3.8 mmol/L (ref 3.5–5.1)
Sodium: 129 mmol/L — ABNORMAL LOW (ref 135–145)
Sodium: 129 mmol/L — ABNORMAL LOW (ref 135–145)

## 2023-10-18 LAB — BRAIN NATRIURETIC PEPTIDE: B Natriuretic Peptide: 744.1 pg/mL — ABNORMAL HIGH (ref 0.0–100.0)

## 2023-10-18 LAB — MAGNESIUM
Magnesium: 3 mg/dL — ABNORMAL HIGH (ref 1.7–2.4)
Magnesium: 2.9 mg/dL — ABNORMAL HIGH (ref 1.7–2.4)

## 2023-10-18 LAB — COOXEMETRY PANEL
Carboxyhemoglobin: 2 % — ABNORMAL HIGH (ref 0.5–1.5)
Methemoglobin: <0.7 % (ref 0.0–1.5)
O2 Saturation: 70.4 %
Total hemoglobin: 18 g/dL — ABNORMAL HIGH (ref 12.0–16.0)

## 2023-10-18 MED ORDER — CLONAZEPAM 0.5 MG PO TABS
0.5000 mg | ORAL_TABLET | Freq: Two times a day (BID) | ORAL | Status: DC
  Administered 2023-10-18 – 2023-10-23 (×11): 0.5 mg via ORAL
  Filled 2023-10-18 (×11): qty 1

## 2023-10-18 MED ORDER — ALBUMIN HUMAN 25 % IV SOLN
25.0000 g | Freq: Once | INTRAVENOUS | Status: AC
  Administered 2023-10-18: 25 g via INTRAVENOUS
  Filled 2023-10-18 (×2): qty 100

## 2023-10-18 MED ORDER — MIDODRINE HCL 5 MG PO TABS
5.0000 mg | ORAL_TABLET | Freq: Three times a day (TID) | ORAL | Status: DC
  Administered 2023-10-18 – 2023-10-19 (×2): 5 mg via ORAL
  Filled 2023-10-18 (×3): qty 1

## 2023-10-18 MED ORDER — MIDODRINE HCL 5 MG PO TABS
5.0000 mg | ORAL_TABLET | Freq: Once | ORAL | Status: AC
  Administered 2023-10-18: 5 mg via ORAL
  Filled 2023-10-18: qty 1

## 2023-10-18 NOTE — Progress Notes (Signed)
Per Dr. Allena Katz and Dr. Odis Hollingshead if the patient continues to have episodes of v-tach to call on call provider and have Amiodarone drip activated/restarted. Additionally, MAP goal of 60 or greater.

## 2023-10-18 NOTE — Progress Notes (Signed)
Patient blood pressure 88/64, charge nurse and this nurse both at bedside when on call provider came to bedside to say due to patients blood pressure being low to go ahead and give patient his 2200 Midodrine, orders also in to give Albumin IV, explain to patient reasons for giving medication, patient became agitated and refused to take medications or let charge nurse fix CVP readings, insisting on speaking with doctor, states we are messing with his mental health due to day shift nurse gave patient medication to bring up his blood pressure, now he want to know why his blood pressure is still low and feels we do not know what we are doing. Both this nurse and the charge nurse tried educating him on CVP readings and medications, however he still refused, stating he only wants to speak to the doctor, on call provider messaged via secure chat, at this time NP states he is added to her list.

## 2023-10-18 NOTE — Progress Notes (Signed)
Patient ID: Dale [name], male   DOB: 1961-03-29, 62 y.o.   MRN: [id number]      Advanced Heart Failure Rounding Note  PCP-Cardiologist: Marca Ancona, MD   Subjective:    Resting in bed - able to lay flat.  Sitter at bedside. On 4L [place]  Co-ox 70.4% CVP 5 mmHg this morning.  Gtt: milrinone  Cr: 2.4->2.3 UOP last 24hr 3200cc Net IO Since Admission: -16,405.57 mL [10/18/23 0813]  Objective:   Weight Range: 103.9 kg Body mass index is 31.95 kg/m.   Vital Signs:   Temp:  [97.7 F (36.5 C)-98.8 F (37.1 C)] 97.8 F (36.6 C) (12/14 0349) Pulse Rate:  [85-97] 91 (12/14 0600) Resp:  [18-36] 23 (12/14 0600) BP: (95-139)/(51-106) 119/64 (12/14 0600) SpO2:  [80 %-100 %] 95 % (12/14 0600) Weight:  [103.9 kg] 103.9 kg (12/14 0400) Last BM Date : 10/16/23  Weight change: Filed Weights   10/16/23 0515 10/17/23 0500 10/18/23 0400  Weight: 106.1 kg 103.9 kg 103.9 kg    Intake/Output:   Intake/Output Summary (Last 24 hours) at 10/18/2023 0813 Last data filed at 10/18/2023 0600 Gross per 24 hour  Intake 1727.95 ml  Output 3200 ml  Net -1472.05 ml     Physical Exam    General: NAD, older than stated age, hemodynamically stable. Neck: JVP present, no thyromegaly or thyroid nodule.  Lungs: Clear to auscultation bilaterally with normal respiratory effort. CV: Lateral PMI.  Heart regular S1/S2, no S3/S4, no murmur.  No peripheral edema, unna boots present.  Abdomen: Soft, nontender, no hepatosplenomegaly, no distention.  Skin: Intact without lesions or rashes.  Neurologic: Alert and oriented x 3.  Psych: Normal affect. Extremities: No clubbing or cyanosis.  HEENT: Normal.   Telemetry   SR w/ PVC and short asymptomatic NSVT (personally reviewed)  Labs    CBC Recent Labs    10/17/23 0354 10/18/23 0408  WBC 7.2 6.2  HGB 12.8* 13.2  HCT 37.1* 38.7*  MCV 90.0 90.4  PLT 242 247   Basic Metabolic Panel Recent Labs    10/93/23 1716 10/17/23 0354  10/17/23 2223 10/18/23 0408  NA 133* 131* 128* 129*  K 4.3 4.4 3.6 3.9  CL 90* 89* 87* 88*  CO2 34* 32 30 31  GLUCOSE 146* 218* 155* 126*  BUN 53* 52* 57* 56*  CREATININE 2.02* 2.24* 2.42* 2.31*  CALCIUM 9.5 8.9 8.4* 8.4*  MG  --  2.6*  --  3.0*  PHOS 3.8  --  4.6  --    Liver Function Tests Recent Labs    10/16/23 1716 10/17/23 2223  ALBUMIN 3.6 3.2*   No results for input(s): "LIPASE", "AMYLASE" in the last 72 hours. Cardiac Enzymes No results for input(s): "CKTOTAL", "CKMB", "CKMBINDEX", "TROPONINI" in the last 72 hours.  BNP: BNP (last 3 results) Recent Labs    09/03/23 0958 10/10/23 0249 10/13/23 1830  BNP 2,348.4* 3,459.8* >4,500.0*    ProBNP (last 3 results) No results for input(s): "PROBNP" in the last 8760 hours.   D-Dimer No results for input(s): "DDIMER" in the last 72 hours. Hemoglobin A1C No results for input(s): "HGBA1C" in the last 72 hours. Fasting Lipid Panel No results for input(s): "CHOL", "HDL", "LDLCALC", "TRIG", "CHOLHDL", "LDLDIRECT" in the last 72 hours. Thyroid Function Tests No results for input(s): "TSH", "T4TOTAL", "T3FREE", "THYROIDAB" in the last 72 hours.  Invalid input(s): "FREET3"  Other results:   Imaging    No results found.    Medications:  Scheduled Medications:  amiodarone  200 mg Oral BID   aspirin EC  81 mg Oral Daily   atorvastatin  40 mg Oral Daily   Chlorhexidine Gluconate Cloth  6 each Topical Q0600   clonazePAM  0.5 mg Oral Daily   And   clonazePAM  1 mg Oral QHS   dapagliflozin propanediol  10 mg Oral Daily   enoxaparin (LOVENOX) injection  40 mg Subcutaneous Q24H   famotidine  20 mg Oral BID   folic acid  1 mg Oral Daily   isosorbide-hydrALAZINE  0.5 tablet Oral TID   mirtazapine  7.5 mg Oral QHS   multivitamin with minerals  1 tablet Oral Daily   sodium chloride flush  10-40 mL Intracatheter Q12H   [START ON 10/19/2023] thiamine  100 mg Oral Daily    Infusions:  milrinone 0.25  mcg/kg/min (10/18/23 0600)    PRN Medications: acetaminophen, LORazepam **OR** LORazepam, mouth rinse, sodium chloride flush   Assessment/Plan   1. Acute on chronic systolic CHF: Mixed ischemic/nonischemic cardiomyopathy. He has a history of alcohol and cocaine abuse, which likely contributes to cardiomyopathy.   LHC in 7/20 and again in 5/23 showed RCA chronic occlusion.  RHC in 5/23 showed low output with CI 1.53 and markedly elevated filling pressures.   Echo this admission with EF < 20%, severe LV dilation, no LV thrombus noted, moderate RV dysfunction, mild MR. He is readmitted with marked volume overload, hypoxemia, and NYHA class IIIb-IV symptoms.  Based on past evaluation, suspect low output HF.   He is now on milrinone 0.25 - CVP , Co-ox 70.4%, UOP over 24hrs 3200cc Lasix gtt and metolazone held as of 10/17/2023 due to AKI SBP improving over the last 12 hrs.  Volume status: improving  - Will continue milrinone until renal function improves -Monitor I/O, CVP, Co-ox, weights daily.  -Will add BNP since he has diuresed well since admit. Net IO Since Admission: -16,405.57 mL [10/18/23 0813] -Not a candidate for home milrinone with substance abuse and functional homelessness.  -Will start torsemide once Cr improves.  -Will restart digoxin and Aldactone once Cr improves  -Continue farxiga for now  - Off Entresto due to hypotension and AKI in past.  - Continue Bidil 0.5 tab tid. - Not candidate for ICD unless he can cut back on ETOH abuse.  Narrow QRS so no CRT.  - Hx of cocaine abuse.  - Not an advanced therapies candidate with substance abuse, poor compliance, and living situation (basically homeless, lives with friends or in hotels).  - AHF has d/w that his HF is end stage HF. Palliative care following.   2. CAD: LHC in 7/20 and 5/23 showed CTO of RCA treated medically, left system ok. Suspect cardiomyopathy is primarily nonischemic. No chest pain. - Continue ASA 81 daily +  atorvastatin 40 mg daily.   3. Cocaine Use Disorder: Has been off cocaine for > 2 years now.   4. Alcohol Use Disorder: Still drinking heavily at times, encouraged to continue to cut back even more  5. AKI on CKD stage 3:  Baseline SCr 1.8, now up to 2.1 at admission, suspect cardiorenal syndrome in setting of end stage HF.  Creatinine slowly improving - Support CO with milrinone.  - Held diuretics, digoxin,aldactone for now and will restart once Cr improves.    6. NSVT:  -On oral amiodarone.  -Asymptomatic NSVT likely due to being on milrinone. -Monitor for now.  -Plan as per AHF team was to stop amiodarone when  milrinone is stopped.   7. Suicidal ideation: Psychiatry following.   Spoke to RN during morning rounds - recommendations discussed.   General cardiology to follow for now and AHF likely resume care on Monday.   Length of Stay: 4  Tessa Lerner, DO, [facility]  [address] [place], [place] [address] 8:13 AM 10/18/23

## 2023-10-18 NOTE — Plan of Care (Signed)
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
## 2023-10-18 NOTE — Hospital Course (Addendum)
Brief hospital course: Dale [name] is a 62 y.o. male with medical history significant of HFrEF (EF < 20%) in setting of ICM with chronically occluded RCA as well as prior cocaine abuse and EtOH abuse.  Presented with numbness or shortness of breath.  Found to have volume overload.  Cardiology was consulted started on Primacor drip as well as amiodarone drip with Lasix gtt. Now with worsening renal function Lasix is on hold. Cardiology following.  Palliative care consulted.  Prognosis poor.   Assessment and Plan: Acute on chronic systolic CHF. NICM. Alcohol and cocaine abuse. Management by cardiology service advanced heart failure service. On Primacor.  Was on Lasix gtt. and amiodarone GGT. Due to worsening renal function Lasix on hold. Was started on Zaroxolyn, BiDil, Aldactone although due to poor blood pressure currently all medications are on hold. Continue Farxiga. On oral amiodarone.  If he has further NSVT will require IV amiodarone drip again. Per cardiology not a candidate for any aggressive or long-term therapy. Prognosis is poor.   Nonobstructive CAD. Being treated medically. No chest pain. Aspirin and Lipitor.  NSVT. With [employer].  As well as due to low EF. Unable to tolerate beta-blocker due to blood pressure. Started on amiodarone. Per cardiology if he has persistent episodes of NSVT he will require to be brought back on amiodarone drip  Hypotension. Blood pressure is now running on the lower side. Discussed with cardiology, recommend to continue Primacor. Midodrine added. Will monitor.  MAP more than 60 should be adequate as long as patient asymptomatic.   History of cocaine use. Currently does not abuse. Monitor.   Ongoing alcohol abuse disorder. Was on CIWA protocol. Currently out of withdrawal duration. Has hallucinations both auditory as well as visual but not related to withdrawal. On thiamine. On scheduled Klonopin, will reduce the dose due to  blood pressure.   Acute kidney injury on CKD stage IIIb. Suspect cardiorenal hemodynamics. Initially renal function was improving.  Now worsening again.  Lasix is on hold. Baseline serum creatinine around 1.4-1.7.  On admission serum creatinine 2.09.  Improved to 1.4 and now worsening again to 2.2.   Hypokalemia as well as hypocalcemia. Replaced.   Bilateral leg redness and swelling. Has been refusing DVT prophylaxis in the hospital. Lower EXTR Doppler has not negative.   Acute respiratory failure with hypoxia. Required BiPAP.  Stable on oxygen right now.  Did not require BiPAP since admission.   Suicidal ideation. Psychiatry consulted. Patient have passive suicidal ideation.  Per psychiatry does not require sitter.  Per psychiatry does not require inpatient psychiatric admission. Telesitter can be added if safety concerns arise again. Was on Klonopin.  Will reduce the dose due to hypotension.   Obesity Class 3 Body mass index is 31.95 kg/m.  Placing the pt at higher risk of poor outcomes.    Capacity concern. With his ongoing psychiatric issues unsure if the patient actually has capacity to understand his medical condition and navigate complex medical decision making. Patient in the past has provided daughter Dale [name] but currently wants to include his mother as well in  the decision making.  It appears that he lives with his mother. Unable to reach family on the phone. I attempted to tell the patient that he has chronic kidney disease as well as end-stage heart failure and patient was surprised as if he is hearing it for the first time.  Does not appear that the patient has insight into his medical condition.

## 2023-10-18 NOTE — Progress Notes (Signed)
Daily Progress Note   Patient Name: Dale [name]       Date: 10/18/2023 DOB: 10/29/1961  Age: 62 y.o. MRN#: [id number] Attending Physician: Rolly Salter, MD Primary Care Physician: Pcp, No Admit Date: 10/13/2023 Length of Stay: 4 days  Reason for Consultation/Follow-up: Establishing goals of care  HPI/Patient Profile:  62 y.o. male  with past medical history of HFrEF (EF < 20%) in setting of ICM with chronically occluded RCA as well as prior cocaine abuse and EtOH abuse.  He presented with complaints of increased peripheral edema and dyspnea on exertion.  He was admitted on 10/13/2023 with acute on chronic HFrEF, acute respiratory failure with hypoxia, AKI on CKD, and others.    Palliative medicine was consulted for GOC conversations.  Subjective:   Subjective: Chart Reviewed. Updates received. Patient Assessed. Created space and opportunity for patient  and family to explore thoughts and feelings regarding current medical situation.  Today's Discussion: Today saw the patient at bedside, initially he was sleeping, he was resting calmly in the bed.  Sitter is present at the bedside, no family present.  He states his swelling continues to improve.  He denies chest pain, shortness of breath.  He is worried about his social situation where he will go at discharge.  I explained that social work is trying to work out some potential options for him, he expressed gratitude about this.  We again reviewed the severity of his illness and briefly touched on CODE STATUS.  He remains committed to full code and full scope.  He still feels that he can reverse his heart failure.  I shared the palliative medicine would follow-up sometime next week.  I provided emotional and general support through therapeutic listening, empathy, sharing of stories, and other techniques. I answered all questions and addressed all concerns to the best of my ability.  Discussed with the hospitalist team and nursing  staff.  Review of Systems  Respiratory:  Negative for chest tightness and shortness of breath.   Cardiovascular:  Positive for leg swelling (Improved). Negative for chest pain.  Gastrointestinal:  Negative for abdominal pain, nausea and vomiting.    Objective:   Vital Signs:  BP 105/71   Pulse 86   Temp 97.7 F (36.5 C) (Axillary)   Resp (!) 32   Ht 5\' 11"  (1.803 m)   Wt 103.9 kg   SpO2 92%   BMI 31.95 kg/m   Physical Exam Vitals and nursing note reviewed.  Constitutional:      General: He is not in acute distress.    Appearance: He is ill-appearing.  HENT:     Head: Normocephalic and atraumatic.  Cardiovascular:     Rate and Rhythm: Normal rate.     Comments: Intermittent ectopy Pulmonary:     Effort: Pulmonary effort is normal. No respiratory distress.  Abdominal:     General: Abdomen is flat. Bowel sounds are normal. There is no distension.     Palpations: Abdomen is soft.  Skin:    General: Skin is warm and dry.  Neurological:     General: No focal deficit present.     Mental Status: He is alert.  Psychiatric:        Mood and Affect: Mood normal.        Behavior: Behavior normal.     Palliative Assessment/Data: 40%    Existing Vynca/ACP Documentation: MOST form signed 06/15/2021 DNR effective 06/15/2021 however patient expresses desire for full code currently  Assessment & Plan:  Impression: Present on Admission:  Acute on chronic HFrEF (heart failure with reduced ejection fraction) (HCC)  CKD (chronic kidney disease), stage III (HCC)  Acute kidney injury superimposed on chronic kidney disease (HCC)  Acute respiratory failure with hypoxia (HCC)  Acute on chronic systolic CHF (congestive heart failure) (HCC)  62 year old male with acute presentation chronic comorbidities as described above.  The patient is at end-stage heart failure with minimal treatment options remaining.  Currently on diuresis and milrinone.  His expressed goal to cardiology is  to "get the fluid off" and not interested in a DNR.  The patient expressed suicidal ideations yesterday and is since had a 24-hour sitter.  We briefly discussed serious illness and acceptance of death as possibly contributing to his expressions of wanting to die, although he does state that there is some other factors as well.  He does not seem to accept her process how sick he is and that there are limited options for treatment.  He feels that "I can reverse this."  He is wanting to remain a full code and his goals are to get better and leave the hospital.  Overall prognosis is poor.   SUMMARY OF RECOMMENDATIONS   Remain full code Continue full scope of care Continue emotional support of patient Ongoing discussions about goals of care Remains very high risk for clinical decompensation and death Palliative medicine will follw-up next week  Symptom Management:  Per primary team PMT is available to assist as needed  Code Status: Full code  Prognosis: Unable to determine  Discharge Planning: To Be Determined  Discussed with: Patient, medical team, nursing team  Thank you for allowing [place] to participate in the care of Dale [name] PMT will continue to support holistically.  Time Total: 30 min  Detailed review of medical records (labs, imaging, vital signs), medically appropriate exam, discussed with treatment team, counseling and education to patient, family, & staff, documenting clinical information, medication management, coordination of care  Wynne Dust, NP Palliative Medicine Team  Team Phone # [phone] (Nights/Weekends)  07/03/2021, 8:17 AM

## 2023-10-18 NOTE — Progress Notes (Signed)
[facility] Progress Note Patient: Dale [name] ZOX:[id number] DOB: 01-30-1961 DOA: 10/13/2023  DOS: the patient was seen and examined on 10/18/2023  Brief hospital course: Dale [name] is a 62 y.o. male with medical history significant of HFrEF (EF < 20%) in setting of ICM with chronically occluded RCA as well as prior cocaine abuse and EtOH abuse.  Presented with numbness or shortness of breath.  Found to have volume overload.  Cardiology was consulted started on Primacor drip as well as amiodarone drip with Lasix gtt. Now with worsening renal function Lasix is on hold. Cardiology following.  Palliative care consulted.  Prognosis poor.   Assessment and Plan: Acute on chronic systolic CHF. NICM. Alcohol and cocaine abuse. Management by cardiology service advanced heart failure service. On Primacor.  Was on Lasix gtt. and amiodarone GGT. Due to worsening renal function Lasix on hold. Was started on Zaroxolyn, BiDil, Aldactone although due to poor blood pressure currently all medications are on hold. Continue Farxiga. On oral amiodarone.  If he has further NSVT will require IV amiodarone drip again. Per cardiology not a candidate for any aggressive or long-term therapy. Prognosis is poor.   Nonobstructive CAD. Being treated medically. No chest pain. Aspirin and Lipitor.  NSVT. With [employer].  As well as due to low EF. Unable to tolerate beta-blocker due to blood pressure. Started on amiodarone. Per cardiology if he has persistent episodes of NSVT he will require to be brought back on amiodarone drip  Hypotension. Blood pressure is now running on the lower side. Discussed with cardiology, recommend to continue Primacor. Midodrine added. Will monitor.  MAP more than 60 should be adequate as long as patient asymptomatic.   History of cocaine use. Currently does not abuse. Monitor.   Ongoing alcohol abuse disorder. Was on CIWA protocol. Currently out of  withdrawal duration. Has hallucinations both auditory as well as visual but not related to withdrawal. On thiamine. On scheduled Klonopin, will reduce the dose due to blood pressure.   Acute kidney injury on CKD stage IIIb. Suspect cardiorenal hemodynamics. Initially renal function was improving.  Now worsening again.  Lasix is on hold. Baseline serum creatinine around 1.4-1.7.  On admission serum creatinine 2.09.  Improved to 1.4 and now worsening again to 2.2.   Hypokalemia as well as hypocalcemia. Replaced.   Bilateral leg redness and swelling. Has been refusing DVT prophylaxis in the hospital. Lower EXTR Doppler has not negative.   Acute respiratory failure with hypoxia. Required BiPAP.  Stable on oxygen right now.  Did not require BiPAP since admission.   Suicidal ideation. Psychiatry consulted. Patient have passive suicidal ideation.  Per psychiatry does not require sitter.  Per psychiatry does not require inpatient psychiatric admission. Telesitter can be added if safety concerns arise again. Was on Klonopin.  Will reduce the dose due to hypotension.   Obesity Class 3 Body mass index is 31.95 kg/m.  Placing the pt at higher risk of poor outcomes.    Capacity concern. With his ongoing psychiatric issues unsure if the patient actually has capacity to understand his medical condition and navigate complex medical decision making. Patient in the past has provided daughter Dale [name] but currently wants to include his mother as well in  the decision making.  It appears that he lives with his mother. Unable to reach family on the phone. I attempted to tell the patient that he has chronic kidney disease as well as end-stage heart failure and patient was surprised as if he is  hearing it for the first time.  Does not appear that the patient has insight into his medical condition.   Subjective: Denies any acute complaint.  No nausea no vomiting.   Physical Exam: General: in Mild  distress, No Rash Cardiovascular: S1 and S2 Present, No Murmur Respiratory: Good respiratory effort, Bilateral Air entry present.  Basal crackles, No wheezes Abdomen: Bowel Sound present, No tenderness Extremities: Improving edema Neuro: Alert and oriented x3, no new focal deficit  Data Reviewed: I have Reviewed nursing notes, Vitals, and Lab results. Since last encounter, pertinent lab results CBC and BMP   . I have ordered test including CBC and BMP and magnesium  . I have discussed pt's care plan and test results with cardiology  .   Disposition: Status is: Inpatient Remains inpatient appropriate because: Requiring IV therapies.  enoxaparin (LOVENOX) injection 40 mg Start: 10/15/23 1000   Family Communication: Unable to reach family on the number available in the system. Level of care: ICU continue Vitals:   10/18/23 1600 10/18/23 1700 10/18/23 1805 10/18/23 1809  BP: 117/71 (!) 132/114 (!) 91/49   Pulse: 84 91  84  Resp: 17 (!) 30  20  Temp: 97.8 F (36.6 C)     TempSrc: Axillary     SpO2: 97% 96%  94%  Weight:      Height:         Author: Lynden Oxford, MD 10/18/2023 6:25 PM  Please look on [email or web address] to find out who is on call.

## 2023-10-19 DIAGNOSIS — N179 Acute kidney failure, unspecified: Secondary | ICD-10-CM | POA: Diagnosis not present

## 2023-10-19 DIAGNOSIS — I2510 Atherosclerotic heart disease of native coronary artery without angina pectoris: Secondary | ICD-10-CM

## 2023-10-19 DIAGNOSIS — F1411 Cocaine abuse, in remission: Secondary | ICD-10-CM

## 2023-10-19 DIAGNOSIS — I4729 Other ventricular tachycardia: Secondary | ICD-10-CM | POA: Diagnosis not present

## 2023-10-19 DIAGNOSIS — I5023 Acute on chronic systolic (congestive) heart failure: Secondary | ICD-10-CM | POA: Diagnosis not present

## 2023-10-19 LAB — CBC
HCT: 28.3 % — ABNORMAL LOW (ref 39.0–52.0)
Hemoglobin: 9.5 g/dL — ABNORMAL LOW (ref 13.0–17.0)
MCH: 30.9 pg (ref 26.0–34.0)
MCHC: 33.6 g/dL (ref 30.0–36.0)
MCV: 92.2 fL (ref 80.0–100.0)
Platelets: 182 10*3/uL (ref 150–400)
RBC: 3.07 MIL/uL — ABNORMAL LOW (ref 4.22–5.81)
RDW: 14.6 % (ref 11.5–15.5)
WBC: 4.5 10*3/uL (ref 4.0–10.5)
nRBC: 0 % (ref 0.0–0.2)

## 2023-10-19 LAB — BASIC METABOLIC PANEL
Anion gap: 12 (ref 5–15)
Anion gap: 11 (ref 5–15)
BUN: 43 mg/dL — ABNORMAL HIGH (ref 6–20)
BUN: 44 mg/dL — ABNORMAL HIGH (ref 6–20)
CO2: 25 mmol/L (ref 22–32)
CO2: 26 mmol/L (ref 22–32)
Calcium: 7.5 mg/dL — ABNORMAL LOW (ref 8.9–10.3)
Calcium: 7.6 mg/dL — ABNORMAL LOW (ref 8.9–10.3)
Chloride: 86 mmol/L — ABNORMAL LOW (ref 98–111)
Chloride: 87 mmol/L — ABNORMAL LOW (ref 98–111)
Creatinine, Ser: 1.41 mg/dL — ABNORMAL HIGH (ref 0.61–1.24)
Creatinine, Ser: 1.68 mg/dL — ABNORMAL HIGH (ref 0.61–1.24)
GFR, Estimated: 57 mL/min — ABNORMAL LOW (ref >60)
GFR, Estimated: 46 mL/min — ABNORMAL LOW (ref >60)
Glucose, Bld: 393 mg/dL — ABNORMAL HIGH (ref 70–99)
Glucose, Bld: 340 mg/dL — ABNORMAL HIGH (ref 70–99)
Potassium: 3.2 mmol/L — ABNORMAL LOW (ref 3.5–5.1)
Potassium: 3.4 mmol/L — ABNORMAL LOW (ref 3.5–5.1)
Sodium: 123 mmol/L — ABNORMAL LOW (ref 135–145)
Sodium: 124 mmol/L — ABNORMAL LOW (ref 135–145)

## 2023-10-19 LAB — GLUCOSE, CAPILLARY
Glucose-Capillary: 91 mg/dL (ref 70–99)
Glucose-Capillary: 104 mg/dL — ABNORMAL HIGH (ref 70–99)
Glucose-Capillary: 110 mg/dL — ABNORMAL HIGH (ref 70–99)

## 2023-10-19 LAB — COOXEMETRY PANEL
Carboxyhemoglobin: 2.5 % — ABNORMAL HIGH (ref 0.5–1.5)
Methemoglobin: <0.7 % (ref 0.0–1.5)
O2 Saturation: 62.7 %
Total hemoglobin: 12.6 g/dL (ref 12.0–16.0)

## 2023-10-19 LAB — MAGNESIUM: Magnesium: 2.5 mg/dL — ABNORMAL HIGH (ref 1.7–2.4)

## 2023-10-19 MED ORDER — POTASSIUM CHLORIDE CRYS ER 20 MEQ PO TBCR
40.0000 meq | EXTENDED_RELEASE_TABLET | Freq: Two times a day (BID) | ORAL | Status: DC
  Administered 2023-10-19 – 2023-10-22 (×7): 40 meq via ORAL
  Filled 2023-10-19 (×7): qty 2

## 2023-10-19 MED ORDER — AMIODARONE HCL IN DEXTROSE 360-4.14 MG/200ML-% IV SOLN
30.0000 mg/h | INTRAVENOUS | Status: DC
  Administered 2023-10-19 – 2023-10-22 (×7): 30 mg/h via INTRAVENOUS
  Filled 2023-10-19 (×7): qty 200

## 2023-10-19 MED ORDER — INSULIN ASPART 100 UNIT/ML IJ SOLN
0.0000 [IU] | Freq: Three times a day (TID) | INTRAMUSCULAR | Status: DC
  Administered 2023-10-24: 2 [IU] via SUBCUTANEOUS

## 2023-10-19 MED ORDER — AMIODARONE HCL IN DEXTROSE 360-4.14 MG/200ML-% IV SOLN
60.0000 mg/h | INTRAVENOUS | Status: AC
  Administered 2023-10-19: 60 mg/h via INTRAVENOUS
  Filled 2023-10-19: qty 200

## 2023-10-19 MED ORDER — INSULIN ASPART 100 UNIT/ML IJ SOLN: 0.0000 [IU] | Freq: Every day | INTRAMUSCULAR | Status: DC

## 2023-10-19 MED ORDER — MIDODRINE HCL 5 MG PO TABS
10.0000 mg | ORAL_TABLET | Freq: Three times a day (TID) | ORAL | Status: DC
Start: 2023-10-19 — End: 2023-10-19

## 2023-10-19 MED ORDER — MIDODRINE HCL 5 MG PO TABS
5.0000 mg | ORAL_TABLET | Freq: Three times a day (TID) | ORAL | Status: DC
Start: 2023-10-19 — End: 2023-10-20
  Administered 2023-10-19 – 2023-10-20 (×3): 5 mg via ORAL
  Filled 2023-10-19 (×3): qty 1

## 2023-10-19 NOTE — Progress Notes (Signed)
    Patient Name: Dale [name]           DOB: 11/27/1960  MRN: [id number]      Admission Date: 10/13/2023  Attending Provider: Rolly Salter, MD  Primary Diagnosis: Acute on chronic HFrEF (heart failure with reduced ejection fraction) [facility])   Level of care: ICU    CROSS COVER NOTE   Date of Service   10/19/2023   Dale [name], 62 y.o. male, was admitted on 10/13/2023 for Acute on chronic HFrEF (heart failure with reduced ejection fraction) (HCC).    HPI/Events of Note   Notified by bedside RN of SBP 80s with MAP of low 60s. Previously today, patient also had low BP and was started on midodrine.   He has an order for a dose of midodrine to be given now, will add on IV albumin dose.  Addendum: BP improving after amiodarone and IV albumin.  0130-patient had a 21 beat run of V. tach.  Asymptomatic.  Hemodynamically stable.  Currently on oral amiodarone.  He had 2 other short NSVT episodes earlier tonight.  Per handoff with Dr. Allena Katz, Cardiology recommended starting patient on amiodarone drip if he had further NSVT episodes.  Will transition to amiodarone gtt.   Nursing staff to continue monitoring BP and QTc.   Interventions/ Plan   IV albumin x 1 Amiodarone gtt.        Anthoney Harada, DNP, ACNPC- AG [facility]

## 2023-10-19 NOTE — Progress Notes (Signed)
[facility] Progress Note Patient: Dale [name] UXL:[id number] DOB: 1961-08-17 DOA: 10/13/2023  DOS: the patient was seen and examined on 10/19/2023  Brief hospital course: Dale [name] is a 62 y.o. male with medical history significant of HFrEF (EF < 20%) in setting of ICM with chronically occluded RCA as well as prior cocaine abuse and EtOH abuse.  Presented with  shortness of breath.  Found to have volume overload.  Cardiology was consulted started on Primacor drip as well as amiodarone drip with Lasix gtt. Now with worsening renal function Lasix is on hold. Cardiology following.  Palliative care consulted.  Prognosis poor.   Assessment and Plan: Acute on chronic systolic CHF. NICM. Alcohol and cocaine abuse. Management by cardiology/advanced heart failure service. On Primacor.  Was on Lasix gtt.  Due to NSVT on amiodarone gtt. Due to worsening renal function Lasix on hold. Was started on Zaroxolyn, BiDil, Aldactone although due to poor blood pressure currently these medications are on hold. Continue Farxiga. Per cardiology not a candidate for any aggressive or long-term therapy. Prognosis is poor.   Nonobstructive CAD. Being treated medically. No chest pain. Aspirin and Lipitor.  NSVT. With [employer].  As well as due to low EF. Unable to tolerate beta-blocker due to blood pressure. Started on amiodarone.  Hypotension. Blood pressure is now running on the lower side. Discussed with cardiology, recommend to continue Primacor. Midodrine added. Will monitor. Target for MAP more than 60 as long as patient asymptomatic.   History of cocaine use. Currently does not abuse. Monitor.   Ongoing alcohol abuse disorder. Was on CIWA protocol. Currently out of typical withdrawal period. Has hallucinations both auditory as well as visual but not related to withdrawal. On thiamine. On scheduled Klonopin, will reduce the dose due to blood pressure.   Acute kidney  injury on CKD stage IIIb. Suspect cardiorenal hemodynamics. Treated with Lasix, now Lasix is on hold. Baseline serum creatinine around 1.4-1.7.  On admission serum creatinine 2.09.  Improved to 1.4 and now worsening again.   Hypokalemia as well as hypocalcemia. Replaced.   Bilateral leg redness and swelling. Has been refusing DVT prophylaxis in the hospital. Lower EXTR Doppler negative for DVT   Acute respiratory failure with hypoxia. Required BiPAP.  Stable on oxygen right now.  Did not require BiPAP since admission.   Suicidal ideation. Psychiatry consulted. Patient have passive suicidal ideation.  Per psychiatry does not require sitter.  Per psychiatry does not require inpatient psychiatric admission. Telesitter can be added if safety concerns arise again. Was on Klonopin.  Will reduce the dose due to hypotension.   Obesity Class 3 Body mass index is 31.95 kg/m.  Placing the pt at higher risk of poor outcomes.    Capacity concern. Goals of care conversation. Currently full code. With his ongoing psychiatric issues unsure if the patient actually has capacity to understand his medical condition and navigate complex medical decision making. Patient in the past has provided daughter Dale [name] but currently wants to include his mother as well in  the decision making. I attempted to tell the patient that he has chronic kidney disease as well as end-stage heart failure and patient was surprised as if he is hearing it for the first time.  Does not appear that the patient has insight into his medical condition. Discussed with daughter in detail on 12/15.  Recommended her to discuss with the rest of her family members assist the patient with regards to decision making as currently the best approach  for him would be to focus on comfort only. Also patient currently does not live with mother.   Subjective: No nausea no vomiting no fever no chills.  Breathing okay.  Blood pressure was low last  night requiring IV albumin.  Physical Exam: General: in Mild distress, No Rash Cardiovascular: S1 and S2 Present, No Murmur Respiratory: Increased respiratory effort, Bilateral Air entry present.  Basal crackles, No wheezes Abdomen: Bowel Sound present, No tenderness Extremities: Bilateral edema Neuro: Alert and oriented x3, no new focal deficit  Data Reviewed: I have Reviewed nursing notes, Vitals, and Lab results. Since last encounter, pertinent lab results CBC BMP   . I have ordered test including CBC BMP magnesium  .  Disposition: Status is: Inpatient Remains inpatient appropriate because: Monitor for improvement in volume status and further clarity on goals of care as well as appropriate dispo as the patient currently does not have a safe dispo.  enoxaparin (LOVENOX) injection 40 mg Start: 10/15/23 1000   Family Communication: Discussed with daughter at bedside Level of care: ICU continue while on Primacor. Vitals:   10/19/23 1500 10/19/23 1600 10/19/23 1700 10/19/23 1800  BP: 91/70 (!) 99/59 (!) 134/91 119/73  Pulse: 88 89 89 90  Resp: (!) 28 (!) 21 (!) 28 20  Temp:  98.1 F (36.7 C)    TempSrc:  Oral    SpO2: 97% 95% 94% 94%  Weight:      Height:         Author: Lynden Oxford, MD 10/19/2023 6:36 PM  Please look on [email or web address] to find out who is on call.

## 2023-10-19 NOTE — Plan of Care (Signed)

## 2023-10-19 NOTE — TOC Initial Note (Signed)
Transition of Care [facility]) - Initial/Assessment Note    Patient Details  Name: Dale [name] MRN: [id number] Date of Birth: [birth year]  [facility] CM/SW Contact:    Georgie Chard, LCSW Phone Number: 10/19/2023, 2:53 PM  Clinical Narrative:                 CSW spoke to patient and his daughter. Patient was previously living with his girlfriend. However at this time patient reports being homeless is in need of housing resources. CSW did explain to the patient that we have shelters resources which will be added to patients chart. Patient is wanting to go to a SNF when DC. Daughter has stated that dad does not have anywhere to go.TOC will continue to follow for DC plans for patient.   Expected Discharge Plan: Skilled Nursing Facility Barriers to Discharge: No Barriers Identified   Patient Goals and CMS Choice Patient states their goals for this hospitalization and ongoing recovery are:: Patient would like to go to SNF unsure due to INS          Expected Discharge Plan and Services       Living arrangements for the past 2 months: Homeless (Was living with girlfriend)                                      Prior Living Arrangements/Services Living arrangements for the past 2 months: Homeless (Was living with girlfriend)            Need for Family Participation in Patient Care: Yes (Comment) Care giver support system in place?: Yes (comment) (Has a daughter)   Criminal Activity/Legal Involvement Pertinent to Current Situation/Hospitalization: No - Comment as needed  Activities of Daily Living   ADL Screening (condition at time of admission) Independently performs ADLs?: Yes (appropriate for developmental age) Is the patient deaf or have difficulty hearing?: No Does the patient have difficulty seeing, even when wearing glasses/contacts?: No Does the patient have difficulty concentrating, remembering, or making decisions?: No  Permission  Sought/Granted                  Emotional Assessment     Affect (typically observed): Accepting Orientation: : Oriented to Self, Oriented to Place, Oriented to  Time, Oriented to Situation      Admission diagnosis:  Acute on chronic HFrEF (heart failure with reduced ejection fraction) (HCC) [I50.23] Systolic congestive heart failure, unspecified HF chronicity (HCC) [I50.20] Acute on chronic systolic CHF (congestive heart failure) (HCC) [I50.23] Patient Active Problem List   Diagnosis Date Noted   Hx of cocaine abuse (HCC) 10/18/2023   Atherosclerosis of native coronary artery of native heart without angina pectoris 10/18/2023   Acute respiratory failure with hypoxia (HCC) 10/14/2023   HFrEF (heart failure with reduced ejection fraction) (HCC) 02/18/2023   Intractable nausea and vomiting 02/17/2023   Intractable vomiting 02/16/2023   Acute on chronic systolic (congestive) heart failure (HCC) 03/18/2022   Acute on chronic systolic CHF (congestive heart failure) (HCC) 03/18/2022   Alcohol use disorder, moderate, in early remission (HCC) 07/13/2021   Adjustment disorder with mixed anxiety and depressed mood    Palliative care by specialist    Prolonged QT interval 06/14/2021   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) 06/13/2021   CHF (congestive heart failure) (HCC) 06/13/2021   Acute on chronic systolic CHF (congestive heart failure),  NYHA class 4 (HCC)    CKD (chronic kidney disease), stage III (HCC) 06/01/2021   Hypokalemia    Acute on chronic HFrEF (heart failure with reduced ejection fraction) (HCC) 12/25/2020   Coronary artery disease involving native coronary artery of native heart without angina pectoris    Acute kidney injury superimposed on chronic kidney disease (HCC) 10/16/2020   Acute on chronic combined systolic and diastolic HF (heart failure) (HCC) 09/08/2020   Acute on chronic combined systolic (congestive) and diastolic  (congestive) heart failure (HCC)    Acute CHF (congestive heart failure) (HCC) 05/07/2019   Nausea & vomiting 05/07/2019   Acute exacerbation of CHF (congestive heart failure) (HCC) 03/11/2019   Troponin level elevated 03/11/2019   Hyperlipidemia 03/03/2019   NSVT (nonsustained ventricular tachycardia) (HCC) 03/03/2019   Anxiety and depression    NSTEMI (non-ST elevated myocardial infarction) (HCC) 02/28/2019   Acute systolic heart failure (HCC) 11/08/2018   Hypertensive urgency 09/28/2018   Acute pulmonary edema (HCC) 09/28/2018   Cocaine abuse (HCC) 09/28/2018   Substance abuse (HCC) 09/28/2018   Normochromic anemia 09/28/2018   Acute systolic CHF (congestive heart failure) (HCC) 09/27/2018   Essential hypertension 03/11/2016   Obesity (BMI 30-39.9) 03/11/2016   Glucosuria 03/11/2016   PCP:  Pcp, No Pharmacy:   [facility] - [place], [place] - [address] [place] [address] Phone: [phone] Fax: [phone]     Social Drivers of Health (SDOH) Social History: SDOH Screenings   Food Insecurity: Patient Unable To Answer (10/14/2023)  Recent Concern: Food Insecurity - Food Insecurity Present (07/21/2023)  Housing: Medium Risk (10/14/2023)  Transportation Needs: Unmet Transportation Needs (10/14/2023)  Utilities: Patient Unable To Answer (10/14/2023)  Alcohol Screen: High Risk (02/20/2022)  Depression (PHQ2-9): Medium Risk (05/05/2019)  Financial Resource Strain: High Risk (08/25/2023)  Tobacco Use: Low Risk  (10/14/2023)   SDOH Interventions: Food Insecurity Interventions: Patient Unable to Answer   Readmission Risk Interventions    10/19/2023    2:46 PM 02/18/2023   12:36 PM  Readmission Risk Prevention Plan  Transportation Screening Complete Complete  PCP or Specialist Appt within 3-5 Days  Complete  HRI or Home Care Consult  Complete  Social Work Consult for Recovery Care Planning/Counseling  Complete  Palliative  Care Screening  Not Applicable  Medication Review Oceanographer)  Complete  PCP or Specialist appointment within 3-5 days of discharge --   HRI or Home Care Consult Not Complete   HRI or Home Care Consult Pt Refusal Comments Homeless   SW Recovery Care/Counseling Consult Complete

## 2023-10-19 NOTE — Plan of Care (Signed)
  Problem: Activity: Goal: Risk for activity intolerance will decrease Outcome: Progressing   Problem: Nutrition: Goal: Adequate nutrition will be maintained Outcome: Progressing   Problem: Elimination: Goal: Will not experience complications related to bowel motility Outcome: Progressing Goal: Will not experience complications related to urinary retention Outcome: Progressing   Problem: Metabolic: Goal: Ability to maintain appropriate glucose levels will improve Outcome: Progressing   Problem: Skin Integrity: Goal: Risk for impaired skin integrity will decrease Outcome: Progressing   Problem: Tissue Perfusion: Goal: Adequacy of tissue perfusion will improve Outcome: Progressing

## 2023-10-19 NOTE — Progress Notes (Signed)
Patient ID: Dale [name], male   DOB: 07-20-1961, 62 y.o.   MRN: [id number]      Advanced Heart Failure Rounding Note  PCP-Cardiologist: Marca Ancona, MD   Subjective:    Resting in bed - able to lay flat.  Remains on [place]  Co-ox 62.7% CVP - pending, RN aware and working on it  Gtt: milrinone  Cr: 2.4->2.3->1.68 UOP last 24hr 2150cc Patient has removed the unna boots  Net IO Since Admission: -16,502.74 mL [10/19/23 0831]  Objective:   Weight Range: 102.4 kg Body mass index is 31.49 kg/m.   Vital Signs:   Temp:  [97.1 F (36.2 C)-97.8 F (36.6 C)] 97.6 F (36.4 C) (12/15 0351) Pulse Rate:  [80-97] 80 (12/15 0700) Resp:  [13-32] 29 (12/15 0800) BP: (83-150)/(42-114) 101/59 (12/15 0800) SpO2:  [90 %-100 %] 95 % (12/15 0700) Weight:  [102.4 kg] 102.4 kg (12/15 0400) Last BM Date : 10/18/23  Weight change: Filed Weights   10/17/23 0500 10/18/23 0400 10/19/23 0400  Weight: 103.9 kg 103.9 kg 102.4 kg    Intake/Output:   Intake/Output Summary (Last 24 hours) at 10/19/2023 0831 Last data filed at 10/19/2023 0600 Gross per 24 hour  Intake 2035.89 ml  Output 2150 ml  Net -114.11 ml     Physical Exam    General: NAD, older than stated age, hemodynamically stable. Neck: no JVP, no thyromegaly or thyroid nodule.  Lungs: Clear to auscultation bilaterally with normal respiratory effort. CV: Lateral PMI.  Heart regular S1/S2, no S3/S4, no murmur.  No peripheral edema Abdomen: Soft, nontender, no hepatosplenomegaly, no distention.  Skin: Intact without lesions or rashes.  Neurologic: Alert and oriented x 3.  Psych: Normal affect. Extremities: No clubbing or cyanosis.  HEENT: Normal.   Telemetry   SR w/ PVC and short asymptomatic NSVT (personally reviewed). 10/18/2023 around 1711 - 12 beat NSVT 10/19/2023 around 0110 - 18 beat NSVT   Labs    CBC Recent Labs    10/18/23 0408 10/19/23 0408  WBC 6.2 4.5  HGB 13.2 9.5*  HCT 38.7* 28.3*  MCV 90.4 92.2   PLT 247 182   Basic Metabolic Panel Recent Labs    46/96/29 2223 10/18/23 0408 10/18/23 1801 10/19/23 0408 10/19/23 0454  NA 128* 129* 129*  130* 123* 124*  K 3.6 3.9 3.8  3.7 3.2* 3.4*  CL 87* 88* 91*  91* 86* 87*  CO2 30 31 31  30 25 26   GLUCOSE 155* 126* 105*  108* 393* 340*  BUN 57* 56* 50*  52* 43* 44*  CREATININE 2.42* 2.31* 1.92*  1.92* 1.41* 1.68*  CALCIUM 8.4* 8.4* 8.5*  8.4* 7.5* 7.6*  MG  --  3.0* 2.9*  --   --   PHOS 4.6  --  3.3  --   --    Liver Function Tests Recent Labs    10/17/23 2223 10/18/23 1801  ALBUMIN 3.2* 3.2*   No results for input(s): "LIPASE", "AMYLASE" in the last 72 hours. Cardiac Enzymes No results for input(s): "CKTOTAL", "CKMB", "CKMBINDEX", "TROPONINI" in the last 72 hours.  BNP: BNP (last 3 results) Recent Labs    10/10/23 0249 10/13/23 1830 10/18/23 1328  BNP 3,459.8* >4,500.0* 744.1*    ProBNP (last 3 results) No results for input(s): "PROBNP" in the last 8760 hours.   D-Dimer No results for input(s): "DDIMER" in the last 72 hours. Hemoglobin A1C No results for input(s): "HGBA1C" in the last 72 hours. Fasting Lipid Panel No results for  input(s): "CHOL", "HDL", "LDLCALC", "TRIG", "CHOLHDL", "LDLDIRECT" in the last 72 hours. Thyroid Function Tests No results for input(s): "TSH", "T4TOTAL", "T3FREE", "THYROIDAB" in the last 72 hours.  Invalid input(s): "FREET3"  Other results:   Imaging    No results found.    Medications:     Scheduled Medications:  aspirin EC  81 mg Oral Daily   atorvastatin  40 mg Oral Daily   Chlorhexidine Gluconate Cloth  6 each Topical Q0600   clonazePAM  0.5 mg Oral BID   dapagliflozin propanediol  10 mg Oral Daily   enoxaparin (LOVENOX) injection  40 mg Subcutaneous Q24H   famotidine  20 mg Oral BID   folic acid  1 mg Oral Daily   midodrine  5 mg Oral TID WC   mirtazapine  7.5 mg Oral QHS   multivitamin with minerals  1 tablet Oral Daily   potassium chloride  40 mEq  Oral BID   sodium chloride flush  10-40 mL Intracatheter Q12H   thiamine  100 mg Oral Daily    Infusions:  amiodarone 30 mg/hr (10/19/23 0729)   milrinone 0.25 mcg/kg/min (10/19/23 0600)    PRN Medications: acetaminophen, mouth rinse, sodium chloride flush   Assessment/Plan   1. Acute on chronic systolic CHF: Mixed ischemic/nonischemic cardiomyopathy. He has a history of alcohol and cocaine abuse, which likely contributes to cardiomyopathy.   LHC in 7/20 and again in 5/23 showed RCA chronic occlusion.  RHC in 5/23 showed low output with CI 1.53 and markedly elevated filling pressures.   Echo this admission with EF < 20%, severe LV dilation, no LV thrombus noted, moderate RV dysfunction, mild MR. He is readmitted with marked volume overload, hypoxemia, and NYHA class IIIb-IV symptoms.  Based on past evaluation, suspect low output HF.   He is now on milrinone 0.25 - CVP pending this morning, Co-ox 62.7%, UOP over 24hrs 2150cc Lasix gtt and metolazone held as of 10/17/2023 due to AKI SBP have remained soft - started on midodrine 10/18/2023 and was given albumin by physician overnight.  Volume status: improving  - Based on his CVP - likely hold milrinone and see how he does clinically.  -Monitor I/O, CVP, Co-ox, weights daily.  -BNP has improved significantly.  -Net IO Since Admission: -16,502.74 mL [10/19/23 0831] -Not a candidate for home milrinone with substance abuse and functional homelessness.  -GDMT on hold due to soft BP requiring midodrine support.  -Likely start diuretics tomorrow.  -Continue Farxiga for now  - Off Entresto due to hypotension and AKI in past.  - Holding Bidil, Aldactone, diuretics due to soft BP.  - Not candidate for ICD unless he can cut back on ETOH abuse.  Narrow QRS so no CRT.  - Hx of cocaine abuse.  - Not an advanced therapies candidate with substance abuse, poor compliance, and living situation (basically homeless, lives with friends or in hotels).   - AHF has d/w that his HF is end stage HF. Palliative care following.   2. CAD: LHC in 7/20 and 5/23 showed CTO of RCA treated medically, left system ok. Suspect cardiomyopathy is primarily nonischemic. No chest pain. - Continue ASA 81 daily + atorvastatin 40 mg daily.   3. Cocaine Use Disorder: Has been off cocaine for > 2 years now.   4. Alcohol Use Disorder: Still drinking heavily at times, encouraged to continue to cut back even more  5. AKI on CKD stage 3:  Baseline SCr 1.5, now 1.7 Suspect cardiorenal syndrome in setting  of end stage HF.  Creatinine slowly improving - Support CO with milrinone and currently on midodrine.   6. NSVT:  -Was on oral amiodarone but transitioned to IV due to frequent NSVT.  - Morning K is low - adding Kdur bid  - will check MG  - EKG to evaluate his QT  - if CVP is reassuring will hold milrinone and see how he does.   7. Suicidal ideation: Psychiatry following.   Spoke to RN during morning rounds - recommendations discussed.   General cardiology to follow for now and AHF likely resume care on Monday.   Length of Stay: 5  Tessa Lerner, DO, [facility]  [address] [place], [place] [address] 8:31 AM 10/19/23

## 2023-10-19 NOTE — Progress Notes (Addendum)
Patient had a 21 beat run of V-Tach, on call provider and charge nurse made aware, patient assisted back to bed and other leads and pulse ox replaced. Patient started back on Amino gtt at 60 mg/hr =33.3 ml/hr, patient currently resting in bed with eyes closed, clarification was done with on call night provider about Milrinone gtt, which she responded to keep it infusing with the Amiodarone gtt.f

## 2023-10-20 DIAGNOSIS — I5023 Acute on chronic systolic (congestive) heart failure: Secondary | ICD-10-CM | POA: Diagnosis not present

## 2023-10-20 DIAGNOSIS — I272 Pulmonary hypertension, unspecified: Secondary | ICD-10-CM

## 2023-10-20 DIAGNOSIS — I5043 Acute on chronic combined systolic (congestive) and diastolic (congestive) heart failure: Secondary | ICD-10-CM | POA: Diagnosis not present

## 2023-10-20 DIAGNOSIS — Z7189 Other specified counseling: Secondary | ICD-10-CM | POA: Diagnosis not present

## 2023-10-20 DIAGNOSIS — N179 Acute kidney failure, unspecified: Secondary | ICD-10-CM | POA: Diagnosis not present

## 2023-10-20 DIAGNOSIS — N189 Chronic kidney disease, unspecified: Secondary | ICD-10-CM | POA: Diagnosis not present

## 2023-10-20 DIAGNOSIS — Z515 Encounter for palliative care: Secondary | ICD-10-CM | POA: Diagnosis not present

## 2023-10-20 DIAGNOSIS — F1021 Alcohol dependence, in remission: Secondary | ICD-10-CM | POA: Diagnosis not present

## 2023-10-20 LAB — COOXEMETRY PANEL
Carboxyhemoglobin: 1.6 % — ABNORMAL HIGH (ref 0.5–1.5)
Carboxyhemoglobin: 1.6 % — ABNORMAL HIGH (ref 0.5–1.5)
Methemoglobin: <0.7 % (ref 0.0–1.5)
Methemoglobin: <0.7 % (ref 0.0–1.5)
O2 Saturation: 69.6 %
O2 Saturation: 67.4 %
Total hemoglobin: 13.4 g/dL (ref 12.0–16.0)
Total hemoglobin: 12.9 g/dL (ref 12.0–16.0)

## 2023-10-20 LAB — GLUCOSE, CAPILLARY
Glucose-Capillary: 112 mg/dL — ABNORMAL HIGH (ref 70–99)
Glucose-Capillary: 154 mg/dL — ABNORMAL HIGH (ref 70–99)
Glucose-Capillary: 103 mg/dL — ABNORMAL HIGH (ref 70–99)
Glucose-Capillary: 137 mg/dL — ABNORMAL HIGH (ref 70–99)
Glucose-Capillary: 131 mg/dL — ABNORMAL HIGH (ref 70–99)

## 2023-10-20 LAB — RENAL FUNCTION PANEL
Albumin: 3.3 g/dL — ABNORMAL LOW (ref 3.5–5.0)
Albumin: 3.3 g/dL — ABNORMAL LOW (ref 3.5–5.0)
Anion gap: 8 (ref 5–15)
Anion gap: 10 (ref 5–15)
BUN: 39 mg/dL — ABNORMAL HIGH (ref 6–20)
BUN: 41 mg/dL — ABNORMAL HIGH (ref 6–20)
CO2: 24 mmol/L (ref 22–32)
CO2: 25 mmol/L (ref 22–32)
Calcium: 8.3 mg/dL — ABNORMAL LOW (ref 8.9–10.3)
Calcium: 8.4 mg/dL — ABNORMAL LOW (ref 8.9–10.3)
Chloride: 97 mmol/L — ABNORMAL LOW (ref 98–111)
Chloride: 95 mmol/L — ABNORMAL LOW (ref 98–111)
Creatinine, Ser: 1.82 mg/dL — ABNORMAL HIGH (ref 0.61–1.24)
Creatinine, Ser: 1.76 mg/dL — ABNORMAL HIGH (ref 0.61–1.24)
GFR, Estimated: 42 mL/min — ABNORMAL LOW (ref >60)
GFR, Estimated: 44 mL/min — ABNORMAL LOW (ref >60)
Glucose, Bld: 93 mg/dL (ref 70–99)
Glucose, Bld: 219 mg/dL — ABNORMAL HIGH (ref 70–99)
Phosphorus: 2.8 mg/dL (ref 2.5–4.6)
Phosphorus: 2.8 mg/dL (ref 2.5–4.6)
Potassium: 3.7 mmol/L (ref 3.5–5.1)
Potassium: 4.4 mmol/L (ref 3.5–5.1)
Sodium: 129 mmol/L — ABNORMAL LOW (ref 135–145)
Sodium: 130 mmol/L — ABNORMAL LOW (ref 135–145)

## 2023-10-20 LAB — MAGNESIUM: Magnesium: 2.8 mg/dL — ABNORMAL HIGH (ref 1.7–2.4)

## 2023-10-20 MED ORDER — MILRINONE LACTATE IN DEXTROSE 20-5 MG/100ML-% IV SOLN
0.1250 ug/kg/min | INTRAVENOUS | Status: DC
  Administered 2023-10-20 – 2023-10-21 (×2): 0.125 ug/kg/min via INTRAVENOUS
  Filled 2023-10-20: qty 100

## 2023-10-20 MED ORDER — MELATONIN 5 MG PO TABS
5.0000 mg | ORAL_TABLET | Freq: Every evening | ORAL | Status: AC | PRN
  Administered 2023-10-20 – 2023-10-22 (×3): 5 mg via ORAL
  Filled 2023-10-20 (×3): qty 1

## 2023-10-20 MED ORDER — MIDODRINE HCL 5 MG PO TABS
10.0000 mg | ORAL_TABLET | Freq: Three times a day (TID) | ORAL | Status: DC
  Administered 2023-10-20 – 2023-10-23 (×6): 10 mg via ORAL
  Filled 2023-10-20 (×9): qty 2

## 2023-10-20 NOTE — Discharge Instructions (Signed)
[facility] for [facility] [address] in [place], [place] 24/7 walk up access to mental health services in [place]   [facility] HelpLine: [phone] or [phone]

## 2023-10-20 NOTE — Progress Notes (Signed)
[facility] PROGRESS NOTE   Dale [name] ZOX:[id number] DOB: 08/20/1961 DOA: 10/13/2023  PCP: Pcp, No  Brief History: 62 y.o. male with medical history significant of HFrEF (EF < 20%) in setting of ICM with chronically occluded RCA as well as prior cocaine abuse and EtOH abuse.  Presented with  shortness of breath.  Found to have volume overload.  Cardiology was consulted started on Primacor drip as well as amiodarone drip with Lasix gtt. Now with worsening renal function Lasix is on hold. Cardiology following.  Palliative care consulted.    Consultants: Cardiology.  Palliative care.  Psychiatry  Procedures: None yet    Subjective/Interval History: Patient seems to be quite agitated this morning since that he has not received his breakfast.  He tells me that staff has been ignoring him.  Has had similar history of impulsive behavior and agitation throughout this hospitalization.  Does not appear to be in any discomfort currently.    Assessment/Plan:  Acute on chronic systolic CHF. NICM. Management by cardiology/advanced heart failure service. Patient on milrinone infusion.  Also on amiodarone infusion for NSVT. Patient was on a Lasix infusion as well.  Lasix currently on hold. Management per cardiology.   Unable to use GDMT due to hypotension. Long-term prognosis is poor.   Nonobstructive CAD. Being treated medically. No chest pain. Aspirin and Lipitor.   NSVT. Unable to tolerate beta-blocker due to blood pressure. Started on amiodarone.   Hypotension. Probably multifactorial including medication as well as CHF.  Patient noted to be on midodrine.  Acute kidney injury on CKD stage IIIb. Suspect cardiorenal hemodynamics. Treated with Lasix, now Lasix is on hold. Baseline serum creatinine around 1.4-1.7.  On admission serum creatinine 2.09.  Improved to 1.4 and now worsening again.  Noted to be 1.82 this morning.  Monitor urine output.  Avoid nephrotoxic  agents.  History of cocaine use. Currently does not abuse. Monitor.   Ongoing alcohol abuse disorder. Was on CIWA protocol. Currently out of typical withdrawal period. Has hallucinations both auditory as well as visual but not related to withdrawal. On thiamine. On scheduled Klonopin, will reduce the dose due to blood pressure.   Hypokalemia as well as hypocalcemia. Replaced.   Bilateral leg redness and swelling. Has been refusing DVT prophylaxis in the hospital. Lower EXTR Doppler negative for DVT   Acute respiratory failure with hypoxia. Required BiPAP.  Stable on oxygen right now.  Did not require BiPAP since admission.   Suicidal ideation. Psychiatry consulted. Patient have passive suicidal ideation.  Per psychiatry does not require sitter.  Per psychiatry does not require inpatient psychiatric admission.   Obesity Class 3 Body mass index is 31.95 kg/m.  Placing the pt at higher risk of poor outcomes.    Capacity concern. Goals of care conversation. Currently full code.  Seen by palliative care.  Patient very impulsive.  DVT Prophylaxis: On Lovenox Code Status: Full code Family Communication: Discussed with patient Disposition Plan: To be determined      Medications: Scheduled:  aspirin EC  81 mg Oral Daily   atorvastatin  40 mg Oral Daily   Chlorhexidine Gluconate Cloth  6 each Topical Q0600   clonazePAM  0.5 mg Oral BID   enoxaparin (LOVENOX) injection  40 mg Subcutaneous Q24H   famotidine  20 mg Oral BID   folic acid  1 mg Oral Daily   insulin aspart  0-15 Units Subcutaneous TID WC   insulin aspart  0-5 Units Subcutaneous QHS   midodrine  5 mg Oral TID WC   mirtazapine  7.5 mg Oral QHS   multivitamin with minerals  1 tablet Oral Daily   potassium chloride  40 mEq Oral BID   sodium chloride flush  10-40 mL Intracatheter Q12H   thiamine  100 mg Oral Daily   Continuous:  amiodarone 30 mg/hr (10/20/23 0823)   milrinone 0.25 mcg/kg/min (10/20/23 0600)    HYQ:[id number], melatonin, mouth rinse, sodium chloride flush  Antibiotics: Anti-infectives (From admission, onward)    None       Objective:  Vital Signs  Vitals:   10/20/23 0555 10/20/23 0604 10/20/23 0609 10/20/23 0800  BP:  90/64 103/75   Pulse: 84 83 86   Resp: (!) 29 (!) 26 (!) 28   Temp:    98.5 F (36.9 C)  TempSrc:    Oral  SpO2: 91% 96% 92%   Weight:   103.8 kg   Height:        Intake/Output Summary (Last 24 hours) at 10/20/2023 0912 Last data filed at 10/20/2023 4132 Gross per 24 hour  Intake 1584.74 ml  Output 2050 ml  Net -465.26 ml   Filed Weights   10/18/23 0400 10/19/23 0400 10/20/23 0609  Weight: 103.9 kg 102.4 kg 103.8 kg    General appearance: Awake alert.  In no distress.  Somewhat agitated Resp: Clear to auscultation bilaterally.  Normal effort Cardio: S1-S2 is normal regular.  No S3-S4.  No rubs murmurs or bruit GI: Abdomen is soft.  Nontender nondistended.  Bowel sounds are present normal.  No masses organomegaly    Lab Results:  Data Reviewed: I have personally reviewed following labs and reports of the imaging studies  CBC: Recent Labs  Lab 10/13/23 1830 10/15/23 0252 10/16/23 0521 10/17/23 0354 10/18/23 0408 10/19/23 0408  WBC 6.6 7.7 7.1 7.2 6.2 4.5  NEUTROABS 4.9  --   --   --   --   --   HGB 13.6 12.6* 12.7* 12.8* 13.2 9.5*  HCT 41.6 37.4* 37.5* 37.1* 38.7* 28.3*  MCV 93.7 91.0 90.6 90.0 90.4 92.2  PLT 284 236 245 242 247 182    Basic Metabolic Panel: Recent Labs  Lab 10/16/23 1716 10/17/23 0354 10/17/23 2223 10/18/23 0408 10/18/23 1801 10/19/23 0408 10/19/23 0454 10/20/23 0600  NA 133* 131* 128* 129* 129*  130* 123* 124* 129*  K 4.3 4.4 3.6 3.9 3.8  3.7 3.2* 3.4* 3.7  CL 90* 89* 87* 88* 91*  91* 86* 87* 97*  CO2 34* 32 30 31 31  30 25 26 24   GLUCOSE 146* 218* 155* 126* 105*  108* 393* 340* 93  BUN 53* 52* 57* 56* 50*  52* 43* 44* 39*  CREATININE 2.02* 2.24* 2.42* 2.31* 1.92*  1.92* 1.41*  1.68* 1.82*  CALCIUM 9.5 8.9 8.4* 8.4* 8.5*  8.4* 7.5* 7.6* 8.3*  MG  --  2.6*  --  3.0* 2.9* 2.5*  --  2.8*  PHOS 3.8  --  4.6  --  3.3  --   --  2.8    GFR: Estimated Creatinine Clearance: 52.9 mL/min (A) (by C-G formula based on SCr of 1.82 mg/dL (H)).  Liver Function Tests: Recent Labs  Lab 10/13/23 1830 10/16/23 1716 10/17/23 2223 10/18/23 1801 10/20/23 0600  AST 28  --   --   --   --   ALT 17  --   --   --   --   ALKPHOS 91  --   --   --   --  BILITOT 1.6*  --   --   --   --   PROT 7.6  --   --   --   --   ALBUMIN 3.9 3.6 3.2* 3.2* 3.3*     CBG: Recent Labs  Lab 10/19/23 1154 10/19/23 1635 10/19/23 2008 10/20/23 0849  GLUCAP 91 104* 110* 112*     Recent Results (from the past 240 hours)  MRSA Next Gen by PCR, Nasal     Status: None   Collection Time: 10/14/23  7:11 AM   Specimen: Nasal Mucosa; Nasal Swab  Result Value Ref Range Status   MRSA by PCR Next Gen NOT DETECTED NOT DETECTED Final    Comment: (NOTE) The GeneXpert MRSA Assay (FDA approved for NASAL specimens only), is one component of a comprehensive MRSA colonization surveillance program. It is not intended to diagnose MRSA infection nor to guide or monitor treatment for MRSA infections. Test performance is not FDA approved in patients less than 66 years old. Performed at [facility], [address], [place], [place] [address]       Radiology Studies: No results found.     LOS: 6 days   Melisse Caetano [employer] on [email or web address]  10/20/2023, 9:12 AM

## 2023-10-20 NOTE — Progress Notes (Signed)
Patient Name: Dale [name] Date of Encounter: 10/20/2023 [facility] Cardiologist: Marca Ancona, MD   Interval Summary  .    Breathing is good.  Denies CP.  Reports feeling very depressed and not wanting to go home to a stressful situation.  Concerned about his overall prognosis.   Vital Signs .    Vitals:   10/20/23 0700 10/20/23 0800 10/20/23 1000 10/20/23 1100  BP: (!) 111/58 111/78 115/76 (!) 91/55  Pulse: 83 87 96 88  Resp: (!) 24 (!) 27 18   Temp:  98.5 F (36.9 C)    TempSrc:  Oral    SpO2: 94% 95% 92% 98%  Weight:      Height:        Intake/Output Summary (Last 24 hours) at 10/20/2023 1110 Last data filed at 10/20/2023 1100 Gross per 24 hour  Intake 1559.92 ml  Output 2150 ml  Net -590.08 ml      10/20/2023    6:09 AM 10/19/2023    4:00 AM 10/18/2023    4:00 AM  Last 3 Weights  Weight (lbs) 228 lb 13.4 oz 225 lb 12 oz 229 lb 0.9 oz  Weight (kg) 103.8 kg 102.4 kg 103.9 kg      Telemetry/ECG    Sinus rhythm.  PVCs.  Up to 10-second NSVT.- Personally Reviewed  RHC 03/2022; Right Heart Pressures RHC Procedural Findings: Hemodynamics (mmHg) RA mean 23 RV 50/21 PA 53/32, mean 40 PCWP mean 39 LV 117/35 AO 117/82  Oxygen saturations: PA 47% AO 97%  Cardiac Output (Fick) 3.5  Cardiac Index (Fick) 1.53 PVR < 1 WU    Physical Exam .   GEN: No acute distress.   Neck: No JVD Cardiac: RRR, no murmurs, rubs, or gallops.  Respiratory: Clear to auscultation bilaterally. GI: Soft, nontender, non-distended  MS: No edema  Assessment & Plan .     # Acute on chronic systolic and diastolic heart failure: # Pulmonary HTN:  LVEF this admission less than 20%.  Severe LV dilatation.  Moderate RV dysfunction.  Patient has end-stage heart failure with low output and no other options for advanced therapies due to substance abuse and being unhoused. Patient has mixed ischemic/nonischemic cardiomyopathy.  History of alcohol and cocaine abuse.   GDMT limited by hypotension.  He remains on milrinone and midodrine.  CVP is 6.  BP 90s-100s/50-70s.  He appears to be compensated on exam.  Will wean milrinone today.  Increase midodrine to 10mg  tid.  No diuretics today.   # CAD:  # Hyperlipidemia:  Left heart cath 05/2019 in 03/2022 revealed CTO of the RCA which was medically managed.  Otherwise nonobstructive CAD.  Continue aspirin and atorvastatin.  # NSVT:  He remains on IV amiodarone.  Did not a candidate for ICD in the setting of cocaine and alcohol abuse.  BP too low for beta-blockers.  # Alcohol use disorder: # Cocaine abuse:  Patient requests social work assistance.   Total critical care time: 45 minutes. Critical care time was exclusive of separately billable procedures and treating other patients. Critical care was necessary to treat or prevent imminent or life-threatening deterioration. Critical care was time spent personally by me on the following activities: development of treatment plan with patient and/or surrogate as well as nursing, discussions with consultants, evaluation of patient's response to treatment, examination of patient, obtaining history from patient or surrogate, ordering and performing treatments and interventions, ordering and review of laboratory studies, ordering and review of radiographic studies,  pulse oximetry and re-evaluation of patient's condition.    For questions or updates, please contact [facility] Please consult [email or web address] for contact info under        Signed, Chilton Si, MD

## 2023-10-20 NOTE — Progress Notes (Signed)
Patient continues to be impulsive when needing to go to the bathroom, he also continues to remove medical equipment, he is compliant when nurse ask to place equipment back on, remains non compliant with his fluid restriction, becomes irritable when he can not have his way. He has been appropriate for this nurse throughout the shift.

## 2023-10-20 NOTE — Progress Notes (Signed)
Palliative:  HPI: 62 y.o. male with past medical history of HFrEF (EF < 20%) in setting of ICM with chronically occluded RCA, prior cocaine abuse, EtOH abuse, PTSD, depression. He presented with complaints of increased peripheral edema and dyspnea on exertion. He was admitted on 10/13/2023 with acute on chronic HFrEF, acute respiratory failure with hypoxia, AKI on CKD stage 3b.   I met today with Dale [name]. Dale [name] is sitting up in bed. He is on room air. He talks with ease with me. He shares with me his concerns for safety in his current living situation. He says he is motivated to consider any other alternative options. He shares he called the [place] today to have them assist with housing. We reviewed if there is any family he could stay with temporarily and he does share that he can potentially stay with his mother but could only do this for ~2 weeks - she lives in [place]. He shares that it is not good to stay with his mother too long as comments she makes can be hurtful and triggering to him and he is trying to stay sober. He denies cocaine use but acknowledges ongoing alcohol use. He shares his desire to rely on his faith for strength to stay clean and take care of himself. He shares that he wants to work more on "getting myself right with God" acknowledging his poor prognosis. He expresses his desire to stay on a good path of health to enjoy time with his children and grandchildren.   We discussed his past traumas and the importance of connecting with counseling to help him find more healthy coping mechanisms than drugs and alcohol - he reports willingness and open to resources. He does report that he went to [place] once but received a hefty bill so he never returned. He also reports that he is in need of clothing. I will follow up with TOC.   Dale [name] does speak of his heart disease and knowledge this is expected to worsen with time. He shares with me the desire to ensure his daughter is "in charge." I discussed with  him HCPOA and Living Will and I believe these will both be good for him to complete. He is willing and we review Advance Directive. He wishes to review with his daughter when she visits next. I placed Advance Directive in folder (discharge folder) per his wish and placed in drawer under paper towels beside sink as instructed. Dale [name] indicates during our review that he would not desire certain prolonging measures if in coma or not in his right mind but does not speak to his current situation. Will allow him some more time to process.   All questions/concerns addressed to the best of my ability. Emotional support provided. Discussed with TOC.   Exam: Alert, oriented. Less impulsive/agitated today than described in previous notes. Calm and able to express his thoughts and frustrations. Breathing regular, unlabored on room air. Sitting up in bed. No distress. Abd soft. Moves all extremities.   Plan: - Ongoing goals of care conversations - Hopeful we can complete Advance Directive prior to discharge  55 min  Yong Channel, NP Palliative Medicine Team Pager [phone] (Please see [email or web address] for schedule) Team Phone [phone]    Greater than 50%  of this time was spent counseling and coordinating care related to the above assessment and plan

## 2023-10-20 NOTE — TOC Progression Note (Addendum)
Transition of Care [facility]) - Progression Note    Patient Details  Name: Dale [name] MRN: [id number] Date of Birth: 1961/06/06  Transition of Care [facility]) CM/SW Contact  Lavenia Atlas, RN Phone Number: 10/20/2023, 3:37 PM  Clinical Narrative:    Received secure message from palliative NP, inquiring about resources for patient. Patient currently in [facility] ICU for acute on chronic HFrEF. Per chart review patient reports he is attempting to get housing assistance via the [place], needs clothing and resources for counseling/ MH resources. Patient reports he can stay at his mother's home for 2 weeks. Per chart review patient has prior history of EtOH and cocaine abuse. This RNCM attached MH/SA resources to AVS. Left voicemail for April with VA to obtain patient's VA status.  - 3:15pm This RNCM spoke with Monique with [facility] to refer for housing assistance. Gabriel Rung will call this RNCM tomorrow at 12:30pm to speak with the patient.  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  Palliative is following and provided HCPOA for patient to complete.   Transportation at discharge: patient will need transportation assistance at discharge.  Barriers: patient may not qualify for SNF placement (d/t type of insurance and currently mobile)    TOC will continue to follow.   Expected Discharge Plan: Home/Self Care (unknown at this time) Barriers to Discharge: Continued Medical Work up  Expected Discharge Plan and Services   Discharge Planning Services: CM Consult Post Acute Care Choice: NA Living arrangements for the past 2 months: Homeless Shelter                 DME Arranged: N/A DME Agency: NA       HH Arranged: NA HH Agency: NA         Social Determinants of Health (SDOH) Interventions SDOH Screenings   Food Insecurity: Patient Unable To Answer  (10/14/2023)  Recent Concern: Food Insecurity - Food Insecurity Present (07/21/2023)  Housing: Medium Risk (10/14/2023)  Transportation Needs: Unmet Transportation Needs (10/14/2023)  Utilities: Patient Unable To Answer (10/14/2023)  Alcohol Screen: High Risk (02/20/2022)  Depression (PHQ2-9): Medium Risk (05/05/2019)  Financial Resource Strain: High Risk (08/25/2023)  Tobacco Use: Low Risk  (10/14/2023)    Readmission Risk Interventions    10/20/2023    3:32 PM 10/19/2023    2:46 PM 02/18/2023   12:36 PM  Readmission Risk Prevention Plan  Transportation Screening Complete Complete Complete  PCP or Specialist Appt within 3-5 Days   Complete  HRI or Home Care Consult   Complete  Social Work Consult for Recovery Care Planning/Counseling   Complete  Palliative Care Screening   Not Applicable  Medication Review Oceanographer) Complete  Complete  PCP or Specialist appointment within 3-5 days of discharge Complete --   HRI or Home Care Consult Complete Not Complete   HRI or Home Care Consult Pt Refusal Comments  Homeless   SW Recovery Care/Counseling Consult Complete Complete   Palliative Care Screening Complete    Skilled Nursing Facility Not Applicable

## 2023-10-20 NOTE — Plan of Care (Signed)
  Problem: Activity: Goal: Risk for activity intolerance will decrease Outcome: Progressing   Problem: Nutrition: Goal: Adequate nutrition will be maintained Outcome: Progressing   Problem: Elimination: Goal: Will not experience complications related to urinary retention Outcome: Progressing   Problem: Skin Integrity: Goal: Risk for impaired skin integrity will decrease Outcome: Progressing   Problem: Fluid Volume: Goal: Ability to maintain a balanced intake and output will improve Outcome: Progressing   Problem: Metabolic: Goal: Ability to maintain appropriate glucose levels will improve Outcome: Progressing   Problem: Skin Integrity: Goal: Risk for impaired skin integrity will decrease Outcome: Progressing   Problem: Tissue Perfusion: Goal: Adequacy of tissue perfusion will improve Outcome: Progressing

## 2023-10-20 NOTE — Plan of Care (Signed)
Patient up at bedside, assisted back to bed, lines and cords untangled, pulse ox changed, oxygen put back on, patient instructed to use call bell and wait for assistance before getting out of bed,also POC and goals discussed,time given for questions.  Problem: Education: Goal: Knowledge of General Education information will improve Description: Including pain rating scale, medication(s)/side effects and non-pharmacologic comfort measures Outcome: Progressing   Problem: Health Behavior/Discharge Planning: Goal: Ability to manage health-related needs will improve Outcome: Progressing   Problem: Clinical Measurements: Goal: Ability to maintain clinical measurements within normal limits will improve Outcome: Progressing Goal: Will remain free from infection Outcome: Progressing Goal: Diagnostic test results will improve Outcome: Progressing Goal: Respiratory complications will improve Outcome: Progressing Goal: Cardiovascular complication will be avoided Outcome: Progressing   Problem: Activity: Goal: Risk for activity intolerance will decrease Outcome: Progressing   Problem: Nutrition: Goal: Adequate nutrition will be maintained Outcome: Progressing   Problem: Coping: Goal: Level of anxiety will decrease Outcome: Progressing   Problem: Elimination: Goal: Will not experience complications related to bowel motility Outcome: Progressing Goal: Will not experience complications related to urinary retention Outcome: Progressing   Problem: Pain Management: Goal: General experience of comfort will improve Outcome: Progressing   Problem: Safety: Goal: Ability to remain free from injury will improve Outcome: Progressing   Problem: Skin Integrity: Goal: Risk for impaired skin integrity will decrease Outcome: Progressing   Problem: Education: Goal: Ability to describe self-care measures that may prevent or decrease complications (Diabetes Survival Skills Education) will  improve Outcome: Progressing Goal: Individualized Educational Video(s) Outcome: Progressing   Problem: Coping: Goal: Ability to adjust to condition or change in health will improve Outcome: Progressing   Problem: Fluid Volume: Goal: Ability to maintain a balanced intake and output will improve Outcome: Progressing   Problem: Health Behavior/Discharge Planning: Goal: Ability to identify and utilize available resources and services will improve Outcome: Progressing Goal: Ability to manage health-related needs will improve Outcome: Progressing   Problem: Metabolic: Goal: Ability to maintain appropriate glucose levels will improve Outcome: Progressing   Problem: Nutritional: Goal: Maintenance of adequate nutrition will improve Outcome: Progressing Goal: Progress toward achieving an optimal weight will improve Outcome: Progressing   Problem: Skin Integrity: Goal: Risk for impaired skin integrity will decrease Outcome: Progressing   Problem: Tissue Perfusion: Goal: Adequacy of tissue perfusion will improve Outcome: Progressing

## 2023-10-21 ENCOUNTER — Inpatient Hospital Stay (HOSPITAL_COMMUNITY): Payer: MEDICAID

## 2023-10-21 DIAGNOSIS — I5023 Acute on chronic systolic (congestive) heart failure: Secondary | ICD-10-CM | POA: Diagnosis not present

## 2023-10-21 DIAGNOSIS — F1021 Alcohol dependence, in remission: Secondary | ICD-10-CM | POA: Diagnosis not present

## 2023-10-21 DIAGNOSIS — N179 Acute kidney failure, unspecified: Secondary | ICD-10-CM | POA: Diagnosis not present

## 2023-10-21 DIAGNOSIS — N189 Chronic kidney disease, unspecified: Secondary | ICD-10-CM | POA: Diagnosis not present

## 2023-10-21 LAB — COOXEMETRY PANEL
Carboxyhemoglobin: 0.7 % (ref 0.5–1.5)
Carboxyhemoglobin: 0.9 % (ref 0.5–1.5)
Methemoglobin: 0.7 % (ref 0.0–1.5)
Methemoglobin: 0.8 % (ref 0.0–1.5)
O2 Saturation: 50.8 %
O2 Saturation: 59.8 %
Total hemoglobin: 12.8 g/dL (ref 12.0–16.0)
Total hemoglobin: 13.1 g/dL (ref 12.0–16.0)

## 2023-10-21 LAB — RENAL FUNCTION PANEL
Albumin: 3.3 g/dL — ABNORMAL LOW (ref 3.5–5.0)
Anion gap: 11 (ref 5–15)
BUN: 40 mg/dL — ABNORMAL HIGH (ref 6–20)
CO2: 26 mmol/L (ref 22–32)
Calcium: 8.6 mg/dL — ABNORMAL LOW (ref 8.9–10.3)
Chloride: 100 mmol/L (ref 98–111)
Creatinine, Ser: 1.58 mg/dL — ABNORMAL HIGH (ref 0.61–1.24)
GFR, Estimated: 50 mL/min — ABNORMAL LOW (ref >60)
Glucose, Bld: 126 mg/dL — ABNORMAL HIGH (ref 70–99)
Phosphorus: 3 mg/dL (ref 2.5–4.6)
Potassium: 4.2 mmol/L (ref 3.5–5.1)
Sodium: 137 mmol/L (ref 135–145)

## 2023-10-21 LAB — CBC
HCT: 36.1 % — ABNORMAL LOW (ref 39.0–52.0)
Hemoglobin: 12.1 g/dL — ABNORMAL LOW (ref 13.0–17.0)
MCH: 30.6 pg (ref 26.0–34.0)
MCHC: 33.5 g/dL (ref 30.0–36.0)
MCV: 91.2 fL (ref 80.0–100.0)
Platelets: 227 10*3/uL (ref 150–400)
RBC: 3.96 MIL/uL — ABNORMAL LOW (ref 4.22–5.81)
RDW: 14.9 % (ref 11.5–15.5)
WBC: 4.6 10*3/uL (ref 4.0–10.5)
nRBC: 0 % (ref 0.0–0.2)

## 2023-10-21 LAB — BASIC METABOLIC PANEL
Anion gap: 9 (ref 5–15)
BUN: 41 mg/dL — ABNORMAL HIGH (ref 6–20)
CO2: 25 mmol/L (ref 22–32)
Calcium: 8.5 mg/dL — ABNORMAL LOW (ref 8.9–10.3)
Chloride: 98 mmol/L (ref 98–111)
Creatinine, Ser: 1.81 mg/dL — ABNORMAL HIGH (ref 0.61–1.24)
GFR, Estimated: 42 mL/min — ABNORMAL LOW (ref >60)
Glucose, Bld: 154 mg/dL — ABNORMAL HIGH (ref 70–99)
Potassium: 4.3 mmol/L (ref 3.5–5.1)
Sodium: 132 mmol/L — ABNORMAL LOW (ref 135–145)

## 2023-10-21 LAB — GLUCOSE, CAPILLARY
Glucose-Capillary: 104 mg/dL — ABNORMAL HIGH (ref 70–99)
Glucose-Capillary: 97 mg/dL (ref 70–99)
Glucose-Capillary: 83 mg/dL (ref 70–99)
Glucose-Capillary: 103 mg/dL — ABNORMAL HIGH (ref 70–99)

## 2023-10-21 LAB — MAGNESIUM: Magnesium: 2.6 mg/dL — ABNORMAL HIGH (ref 1.7–2.4)

## 2023-10-21 LAB — HEMOGLOBIN A1C
Hgb A1c MFr Bld: 6.6 % — ABNORMAL HIGH (ref 4.8–5.6)
Mean Plasma Glucose: 143 mg/dL

## 2023-10-21 MED ORDER — PANTOPRAZOLE SODIUM 40 MG PO TBEC
40.0000 mg | DELAYED_RELEASE_TABLET | Freq: Every day | ORAL | Status: DC
  Administered 2023-10-22: 40 mg via ORAL
  Filled 2023-10-21 (×2): qty 1

## 2023-10-21 MED ORDER — ALUM & MAG HYDROXIDE-SIMETH 200-200-20 MG/5ML PO SUSP
30.0000 mL | Freq: Once | ORAL | Status: AC
  Administered 2023-10-21: 30 mL via ORAL
  Filled 2023-10-21: qty 30

## 2023-10-21 MED ORDER — DIGOXIN 0.25 MG/ML IJ SOLN
0.1250 mg | Freq: Once | INTRAMUSCULAR | Status: AC
  Administered 2023-10-21: 0.125 mg via INTRAVENOUS
  Filled 2023-10-21: qty 2

## 2023-10-21 MED ORDER — PANTOPRAZOLE SODIUM 40 MG IV SOLR
40.0000 mg | Freq: Once | INTRAVENOUS | Status: AC
  Administered 2023-10-21: 40 mg via INTRAVENOUS
  Filled 2023-10-21: qty 10

## 2023-10-21 MED ORDER — LORAZEPAM 2 MG/ML IJ SOLN
1.0000 mg | Freq: Once | INTRAMUSCULAR | Status: AC
  Administered 2023-10-21: 1 mg via INTRAVENOUS
  Filled 2023-10-21: qty 1

## 2023-10-21 MED ORDER — PROCHLORPERAZINE EDISYLATE 10 MG/2ML IJ SOLN
10.0000 mg | INTRAMUSCULAR | Status: DC | PRN
  Administered 2023-10-21: 10 mg via INTRAVENOUS
  Filled 2023-10-21: qty 2

## 2023-10-21 MED ORDER — TORSEMIDE 20 MG PO TABS
80.0000 mg | ORAL_TABLET | Freq: Every day | ORAL | Status: DC
  Administered 2023-10-21 – 2023-10-22 (×2): 80 mg via ORAL
  Filled 2023-10-21 (×2): qty 4

## 2023-10-21 NOTE — Consult Note (Signed)
Brief Psychiatry Consult Note  Stopped by room - Dale [name] sound asleep. Asked charge to let him know I dropped by  -will try to see in AM tomorrow.   [name] A Dyon Rotert

## 2023-10-21 NOTE — Progress Notes (Signed)
Patient Name: Dale [name] Date of Encounter: 10/21/2023 [facility] Cardiologist: Marca Ancona, MD   Interval Summary  .    Patient feels very good today no chest pain or shortness of breath.  On the phone talking with somebody.  Still on supplemental oxygen.  Vital Signs .    Vitals:   10/21/23 0400 10/21/23 0500 10/21/23 0600 10/21/23 0700  BP: 104/76 124/86 96/65 127/86  Pulse:  78 84 82  Resp: (!) 33 13 (!) 31 (!) 27  Temp:      TempSrc:      SpO2:  98% 90% (!) 88%  Weight:  104.2 kg    Height:        Intake/Output Summary (Last 24 hours) at 10/21/2023 0803 Last data filed at 10/21/2023 0400 Gross per 24 hour  Intake 838.48 ml  Output 1450 ml  Net -611.52 ml      10/21/2023    5:00 AM 10/20/2023    6:09 AM 10/19/2023    4:00 AM  Last 3 Weights  Weight (lbs) 229 lb 11.5 oz 228 lb 13.4 oz 225 lb 12 oz  Weight (kg) 104.2 kg 103.8 kg 102.4 kg      Telemetry/ECG    Sinus rhythm, 3 beat NSVT.- Personally Reviewed  CV Studies    Echocardiogram 10/16/2023  1. Left ventricular ejection fraction, by estimation, is <20%. The left  ventricle has severely decreased function. The left ventricle demonstrates  global hypokinesis. The left ventricular internal cavity size was severely  dilated. Left ventricular  diastolic parameters are consistent with Grade II diastolic dysfunction  (pseudonormalization).   2. Right ventricular systolic function is moderately reduced. The right  ventricular size is severely enlarged. There is normal pulmonary artery  systolic pressure.   3. Left atrial size was severely dilated.   4. Right atrial size was severely dilated.   5. The mitral valve is normal in structure. Mild mitral valve  regurgitation. No evidence of mitral stenosis.   6. The aortic valve is tricuspid. Aortic valve regurgitation is trivial.  No aortic stenosis is present.   7. The inferior vena cava is dilated in size with >50% respiratory   variability, suggesting right atrial pressure of 8 mmHg.   Comparison(s): Changes from prior study are noted. Both RV and LV have  dilated further compared to prior studies. Mild functional MR and TR.   [facility] 03/2022; Right Heart Pressures RHC Procedural Findings: Hemodynamics (mmHg) RA mean 23 RV 50/21 PA 53/32, mean 40 PCWP mean 39 LV 117/35 AO 117/82  Oxygen saturations: PA 47% AO 97%  Cardiac Output (Fick) 3.5  Cardiac Index (Fick) 1.53 PVR < 1 WU    Prox LAD lesion is 15% stenosed.   Mid LAD lesion is 40% stenosed.   Prox RCA lesion is 100% stenosed.   2nd Mrg lesion is 30% stenosed.   3rd Mrg lesion is 20% stenosed.   1. No change in coronaries compared to prior cath, occluded proximal RCA with collaterals, nonobstructive disease in the left system.  2. Markedly elevated filling pressures.  3. Pulmonary venous hypertension.  4. Low cardiac output.      Physical Exam .   GEN: No acute distress.   Neck: No JVD Cardiac: RRR, no murmurs, rubs, or gallops.  Respiratory: Clear to auscultation bilaterally. GI: Soft, nontender, non-distended  MS: No edema  Assessment & Plan .     Acute on chronic combined HFrEF <20%, moderately reduced RV End-stage heart failure,  low output Mixed nonischemic/ischemic cardiomyopathy Left heart cath in May 2023 showed chronic occlusion of the RCA with collaterals, same as prior cath.  Right heart cath indicating low output with CI 1.53, CO 3.5.  Admitted and found to be markedly volume up with concerns of low output heart failure requiring milrinone.  Diuresed on Lasix drip and metolazone and held 10/17/2023 due to AKI, volume status now improved and off diuretics.  Euvolemic.  Not a candidate for advanced therapies secondary to substance abuse, poor compliance, and homelessness.  Currently trying to titrate down milrinone drip, and weaning to midodrine. Continue Farxiga.  Still holding BiDil, spironolactone, diuretics. Wean down  milrinone drip per MD, currently on midodrine 10 mg 3 times daily -17 L this admission.  Weight on admission 235.  Currently 229 and appears euvolemic.  Weight is increasing now though. Palliative care following  NSVT Still on IV amiodarone drip, seems to be stabilizing with no significant episodes noted on telemetry.  Not a candidate for ICD in the setting of cocaine and alcohol abuse.  Add beta-blocker once BP tolerates.  CAD Left heart cath CTO of RCA treated medically with collaterals.  Left system okay.  No chest pain Continue aspirin atorvastatin 40 mg  Alcohol use Cocaine use Reportedly off of cocaine for 2 years.  UDS negative for cocaine here a positive for THC  CKD Renal function looks to be stable.  Difficult to determine baseline.  Suspect it is around 1.5-1.8  For questions or updates, please contact [facility] Please consult [email or web address] for contact info under        Signed, Abagail Kitchens, PA-C

## 2023-10-21 NOTE — Plan of Care (Signed)

## 2023-10-21 NOTE — TOC Progression Note (Addendum)
Transition of Care [facility]) - Progression Note    Patient Details  Name: Dale [name] MRN: [id number] Date of Birth: [birth year]  Transition of Care [facility]) CM/SW Contact  Lavenia Atlas, RN Phone Number: 10/21/2023, 1:27 PM  Clinical Narrative:   This RNCM spoke with the patient at bedside with [facility] with [facility]. 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 Patient reports he currently resides with his girlfriend of 12 years. Patient reports his girlfriend reports he can come back to the home. Patient reports he can stay with his mother if he has no other options. Patient reports he stopped cocaine 2 years ago however continues to use alcohol and marijuana. Prior to admission patient reports he has the following DME: walker, cane. Patient agrees to receiving SA/MH resources, which are attached to AVS.   - 1:30pm Monique completed a telephonic housing assessment and unable to offer assistance via the [facility].  Transportation at discharge: may need transportation   TOC will continue to follow for discharge needs.          Expected Discharge Plan: Home/Self Care (unknown at this time) Barriers to Discharge: Continued Medical Work up  Expected Discharge Plan and Services   Discharge Planning Services: CM Consult Post Acute Care Choice: NA Living arrangements for the past 2 months: Homeless Shelter                 DME Arranged: N/A DME Agency: NA       HH Arranged: NA HH Agency: NA         Social Determinants of Health (SDOH) Interventions SDOH Screenings   Food Insecurity: Patient Unable To Answer (10/14/2023)  Recent Concern: Food Insecurity - Food Insecurity Present (07/21/2023)  Housing: Medium Risk (10/14/2023)  Transportation Needs: Unmet Transportation Needs (10/14/2023)  Utilities: Patient Unable To Answer (10/14/2023)  Alcohol Screen: High Risk (02/20/2022)   Depression (PHQ2-9): Medium Risk (05/05/2019)  Financial Resource Strain: High Risk (08/25/2023)  Tobacco Use: Low Risk  (10/14/2023)    Readmission Risk Interventions    10/20/2023    3:32 PM 10/19/2023    2:46 PM 02/18/2023   12:36 PM  Readmission Risk Prevention Plan  Transportation Screening Complete Complete Complete  PCP or Specialist Appt within 3-5 Days   Complete  HRI or Home Care Consult   Complete  Social Work Consult for Recovery Care Planning/Counseling   Complete  Palliative Care Screening   Not Applicable  Medication Review Oceanographer) Complete  Complete  PCP or Specialist appointment within 3-5 days of discharge Complete --   HRI or Home Care Consult Complete Not Complete   HRI or Home Care Consult Pt Refusal Comments  Homeless   SW Recovery Care/Counseling Consult Complete Complete   Palliative Care Screening Complete    Skilled Nursing Facility Not Applicable

## 2023-10-21 NOTE — Progress Notes (Addendum)
[facility] PROGRESS NOTE   Dale [name] WUX:[id number] DOB: [birth year] DOA: 10/13/2023  PCP: Pcp, No  Brief History: 62 y.o. male with medical history significant of HFrEF (EF < 20%) in setting of ICM with chronically occluded RCA as well as prior cocaine abuse and EtOH abuse.  Presented with  shortness of breath.  Found to have volume overload.  Cardiology was consulted started on Primacor drip as well as amiodarone drip with Lasix gtt. Now with worsening renal function Lasix is on hold. Cardiology following.  Palliative care consulted.    Consultants: Cardiology.  Palliative care.  Psychiatry  Procedures: None yet    Subjective/Interval History: Patient noted to be in better spirits this morning.  Mentions that he slept well overnight.  Denies any chest pain.  Shortness of breath is improved.  No nausea or vomiting.   Assessment/Plan:  Acute on chronic systolic CHF. NICM. Management by cardiology/advanced heart failure service. Patient on milrinone infusion.  Also on amiodarone infusion for NSVT. Patient was on a Lasix infusion as well.  Lasix currently on hold. Management per cardiology.   Unable to use GDMT due to hypotension. Long-term prognosis is poor.   Nonobstructive CAD. Being treated medically. No chest pain. Aspirin and Lipitor.  Normocytic anemia Hemoglobin noted to be 9.5 on 12/15 which was lower from previous values.  Today is noted to be 12.1.  The 9.5 was likely an erroneous read.   NSVT. Unable to tolerate beta-blocker due to blood pressure. Started on amiodarone.   Hypotension. Probably multifactorial including medication as well as CHF.  Patient noted to be on midodrine.  Midodrine dose was increased yesterday.  Blood pressure seems to be improving.  Acute kidney injury on CKD stage IIIb. Suspect cardiorenal hemodynamics. Treated with Lasix, now Lasix is on hold. Baseline serum creatinine around 1.4-1.7.   On admission serum  creatinine 2.09.  Improved to 1.4 and now worsening again.  Stable this morning.  Monitor urine output.  Avoid nephrotoxic agents.    History of cocaine use. Currently does not abuse. Monitor.   Ongoing alcohol abuse disorder. Was on CIWA protocol. Currently out of withdrawal period. Has hallucinations both auditory as well as visual but not related to withdrawal. On thiamine.   Hypokalemia as well as hypocalcemia. Replaced.   Bilateral leg redness and swelling. Has been refusing DVT prophylaxis in the hospital. Lower EXTR Doppler negative for DVT   Acute respiratory failure with hypoxia. Required BiPAP.  Stable on oxygen right now.  Did not require BiPAP since admission.   Suicidal ideation. Psychiatry consulted. Patient have passive suicidal ideation.  Per psychiatry does not require sitter.  Per psychiatry does not require inpatient psychiatric admission.   Obesity Class 3 Body mass index is 31.95 kg/m.  Placing the pt at higher risk of poor outcomes.    Capacity concern. Goals of care conversation. Currently full code.  Seen by palliative care.  Patient very impulsive.  DVT Prophylaxis: On Lovenox Code Status: Full code Family Communication: Discussed with patient Disposition Plan: To be determined      Medications: Scheduled:  aspirin EC  81 mg Oral Daily   atorvastatin  40 mg Oral Daily   Chlorhexidine Gluconate Cloth  6 each Topical Q0600   clonazePAM  0.5 mg Oral BID   digoxin  0.125 mg Intravenous Once   enoxaparin (LOVENOX) injection  40 mg Subcutaneous Q24H   famotidine  20 mg Oral BID   folic acid  1 mg Oral Daily  insulin aspart  0-15 Units Subcutaneous TID WC   insulin aspart  0-5 Units Subcutaneous QHS   midodrine  10 mg Oral TID WC   mirtazapine  7.5 mg Oral QHS   multivitamin with minerals  1 tablet Oral Daily   potassium chloride  40 mEq Oral BID   sodium chloride flush  10-40 mL Intracatheter Q12H   thiamine  100 mg Oral Daily    torsemide  80 mg Oral Daily   Continuous:  amiodarone 30 mg/hr (10/21/23 0853)   milrinone 0.125 mcg/kg/min (10/21/23 0853)   YNW:[id number], melatonin, mouth rinse, sodium chloride flush  Antibiotics: Anti-infectives (From admission, onward)    None       Objective:  Vital Signs  Vitals:   10/21/23 0500 10/21/23 0600 10/21/23 0700 10/21/23 0800  BP: 124/86 96/65 127/86 128/74  Pulse: 78 84 82 86  Resp: 13 (!) 31 (!) 27 (!) 24  Temp:   (!) 97.4 F (36.3 C)   TempSrc:   Oral   SpO2: 98% 90% (!) 88% 100%  Weight: 104.2 kg     Height:        Intake/Output Summary (Last 24 hours) at 10/21/2023 0942 Last data filed at 10/21/2023 0853 Gross per 24 hour  Intake 1064.05 ml  Output 1450 ml  Net -385.95 ml   Filed Weights   10/19/23 0400 10/20/23 0609 10/21/23 0500  Weight: 102.4 kg 103.8 kg 104.2 kg    General appearance: Awake alert.  In no distress Resp: Clear to auscultation bilaterally.  Normal effort Cardio: S1-S2 is normal regular.  No S3-S4.  No rubs murmurs or bruit GI: Abdomen is soft.  Nontender nondistended.  Bowel sounds are present normal.  No masses organomegaly    Lab Results:  Data Reviewed: I have personally reviewed following labs and reports of the imaging studies  CBC: Recent Labs  Lab 10/16/23 0521 10/17/23 0354 10/18/23 0408 10/19/23 0408 10/21/23 0535  WBC 7.1 7.2 6.2 4.5 4.6  HGB 12.7* 12.8* 13.2 9.5* 12.1*  HCT 37.5* 37.1* 38.7* 28.3* 36.1*  MCV 90.6 90.0 90.4 92.2 91.2  PLT 245 242 247 182 227    Basic Metabolic Panel: Recent Labs  Lab 10/16/23 1716 10/17/23 0354 10/17/23 2223 10/18/23 0408 10/18/23 1801 10/19/23 0408 10/19/23 0454 10/20/23 0600 10/20/23 1756 10/21/23 0535  NA 133*   < > 128* 129* 129*  130* 123* 124* 129* 130* 132*  K 4.3   < > 3.6 3.9 3.8  3.7 3.2* 3.4* 3.7 4.4 4.3  CL 90*   < > 87* 88* 91*  91* 86* 87* 97* 95* 98  CO2 34*   < > 30 31 31  30 25 26 24 25 25   GLUCOSE 146*   < > 155* 126*  105*  108* 393* 340* 93 219* 154*  BUN 53*   < > 57* 56* 50*  52* 43* 44* 39* 41* 41*  CREATININE 2.02*   < > 2.42* 2.31* 1.92*  1.92* 1.41* 1.68* 1.82* 1.76* 1.81*  CALCIUM 9.5   < > 8.4* 8.4* 8.5*  8.4* 7.5* 7.6* 8.3* 8.4* 8.5*  MG  --    < >  --  3.0* 2.9* 2.5*  --  2.8*  --  2.6*  PHOS 3.8  --  4.6  --  3.3  --   --  2.8 2.8  --    < > = values in this interval not displayed.    GFR: Estimated Creatinine Clearance: 53.3 mL/min (  A) (by C-G formula based on SCr of 1.81 mg/dL (H)).  Liver Function Tests: Recent Labs  Lab 10/16/23 1716 10/17/23 2223 10/18/23 1801 10/20/23 0600 10/20/23 1756  ALBUMIN 3.6 3.2* 3.2* 3.3* 3.3*     CBG: Recent Labs  Lab 10/20/23 1135 10/20/23 1613 10/20/23 2006 10/20/23 2100 10/21/23 0732  GLUCAP 154* 103* 137* 131* 104*     Recent Results (from the past 240 hours)  MRSA Next Gen by PCR, Nasal     Status: None   Collection Time: 10/14/23  7:11 AM   Specimen: Nasal Mucosa; Nasal Swab  Result Value Ref Range Status   MRSA by PCR Next Gen NOT DETECTED NOT DETECTED Final    Comment: (NOTE) The GeneXpert MRSA Assay (FDA approved for NASAL specimens only), is one component of a comprehensive MRSA colonization surveillance program. It is not intended to diagnose MRSA infection nor to guide or monitor treatment for MRSA infections. Test performance is not FDA approved in patients less than 34 years old. Performed at [facility], [place], [address] [place], [place] [address]       Radiology Studies: No results found.     LOS: 7 days   Kuron Docken [employer] on [email or web address]  10/21/2023, 9:42 AM

## 2023-10-22 ENCOUNTER — Encounter (HOSPITAL_COMMUNITY): Payer: Self-pay | Admitting: Internal Medicine

## 2023-10-22 DIAGNOSIS — I5023 Acute on chronic systolic (congestive) heart failure: Secondary | ICD-10-CM | POA: Diagnosis not present

## 2023-10-22 DIAGNOSIS — I130 Hypertensive heart and chronic kidney disease with heart failure and stage 1 through stage 4 chronic kidney disease, or unspecified chronic kidney disease: Secondary | ICD-10-CM

## 2023-10-22 DIAGNOSIS — R570 Cardiogenic shock: Secondary | ICD-10-CM

## 2023-10-22 DIAGNOSIS — I1310 Hypertensive heart and chronic kidney disease without heart failure, with stage 1 through stage 4 chronic kidney disease, or unspecified chronic kidney disease: Secondary | ICD-10-CM

## 2023-10-22 HISTORY — DX: Hypertensive heart and chronic kidney disease without heart failure, with stage 1 through stage 4 chronic kidney disease, or unspecified chronic kidney disease: I13.10

## 2023-10-22 LAB — GLUCOSE, CAPILLARY
Glucose-Capillary: 135 mg/dL — ABNORMAL HIGH (ref 70–99)
Glucose-Capillary: 162 mg/dL — ABNORMAL HIGH (ref 70–99)
Glucose-Capillary: 123 mg/dL — ABNORMAL HIGH (ref 70–99)
Glucose-Capillary: 130 mg/dL — ABNORMAL HIGH (ref 70–99)

## 2023-10-22 LAB — COOXEMETRY PANEL
Carboxyhemoglobin: 0.8 % (ref 0.5–1.5)
Carboxyhemoglobin: 1.8 % — ABNORMAL HIGH (ref 0.5–1.5)
Methemoglobin: <0.7 % (ref 0.0–1.5)
Methemoglobin: <0.7 % (ref 0.0–1.5)
O2 Saturation: 59.9 %
O2 Saturation: 47.5 %
Total hemoglobin: 12.6 g/dL (ref 12.0–16.0)
Total hemoglobin: 14.1 g/dL (ref 12.0–16.0)

## 2023-10-22 LAB — BASIC METABOLIC PANEL
Anion gap: 10 (ref 5–15)
BUN: 39 mg/dL — ABNORMAL HIGH (ref 6–20)
CO2: 24 mmol/L (ref 22–32)
Calcium: 8.2 mg/dL — ABNORMAL LOW (ref 8.9–10.3)
Chloride: 97 mmol/L — ABNORMAL LOW (ref 98–111)
Creatinine, Ser: 1.82 mg/dL — ABNORMAL HIGH (ref 0.61–1.24)
GFR, Estimated: 42 mL/min — ABNORMAL LOW (ref >60)
Glucose, Bld: 146 mg/dL — ABNORMAL HIGH (ref 70–99)
Potassium: 4 mmol/L (ref 3.5–5.1)
Sodium: 131 mmol/L — ABNORMAL LOW (ref 135–145)

## 2023-10-22 LAB — RENAL FUNCTION PANEL
Albumin: 3.8 g/dL (ref 3.5–5.0)
Anion gap: 9 (ref 5–15)
BUN: 42 mg/dL — ABNORMAL HIGH (ref 6–20)
CO2: 21 mmol/L — ABNORMAL LOW (ref 22–32)
Calcium: 8.1 mg/dL — ABNORMAL LOW (ref 8.9–10.3)
Chloride: 102 mmol/L (ref 98–111)
Creatinine, Ser: 1.94 mg/dL — ABNORMAL HIGH (ref 0.61–1.24)
GFR, Estimated: 39 mL/min — ABNORMAL LOW (ref >60)
Glucose, Bld: 119 mg/dL — ABNORMAL HIGH (ref 70–99)
Phosphorus: 3.1 mg/dL (ref 2.5–4.6)
Potassium: 5.1 mmol/L (ref 3.5–5.1)
Sodium: 132 mmol/L — ABNORMAL LOW (ref 135–145)

## 2023-10-22 LAB — MAGNESIUM: Magnesium: 2.3 mg/dL (ref 1.7–2.4)

## 2023-10-22 MED ORDER — POTASSIUM CHLORIDE CRYS ER 20 MEQ PO TBCR
40.0000 meq | EXTENDED_RELEASE_TABLET | Freq: Every day | ORAL | Status: DC
Start: 2023-10-23 — End: 2023-10-23
  Administered 2023-10-23: 40 meq via ORAL
  Filled 2023-10-22: qty 2

## 2023-10-22 MED ORDER — TORSEMIDE 20 MG PO TABS
80.0000 mg | ORAL_TABLET | Freq: Two times a day (BID) | ORAL | Status: DC
  Administered 2023-10-23 (×2): 80 mg via ORAL
  Filled 2023-10-22 (×5): qty 4

## 2023-10-22 MED ORDER — AMIODARONE HCL 400 MG PO TABS: 400.0000 mg | ORAL_TABLET | Freq: Every day | ORAL | 0 refills | Status: DC

## 2023-10-22 MED ORDER — MIDODRINE HCL 10 MG PO TABS: 10.0000 mg | ORAL_TABLET | Freq: Three times a day (TID) | ORAL | 1 refills | Status: DC

## 2023-10-22 MED ORDER — MIRTAZAPINE 15 MG PO TBDP: 7.5000 mg | ORAL_TABLET | Freq: Every day | ORAL | 0 refills | Status: DC

## 2023-10-22 MED ORDER — AMIODARONE HCL 200 MG PO TABS
400.0000 mg | ORAL_TABLET | Freq: Every day | ORAL | Status: DC
  Administered 2023-10-23: 400 mg via ORAL
  Filled 2023-10-22 (×2): qty 2

## 2023-10-22 MED ORDER — ONDANSETRON 4 MG PO TBDP
4.0000 mg | ORAL_TABLET | Freq: Three times a day (TID) | ORAL | Status: DC | PRN
  Administered 2023-10-22 – 2023-10-23 (×2): 4 mg via ORAL
  Filled 2023-10-22 (×2): qty 1

## 2023-10-22 MED ORDER — TORSEMIDE 40 MG PO TABS: 80.0000 mg | ORAL_TABLET | Freq: Two times a day (BID) | ORAL | 1 refills | Status: DC

## 2023-10-22 NOTE — Progress Notes (Signed)
Pt refused multiple medication over the course of the day. Pt was educated on the importance of the different medications. Cardiology was notified of refusal and spoke with pt but he continued to refuse medications. [facility] was also notified of medication refusal.

## 2023-10-22 NOTE — TOC Progression Note (Addendum)
Transition of Care [facility]) - Progression Note    Patient Details  Name: Dale [name] MRN: [id number] Date of Birth: 08-12-1961  Transition of Care [facility]) CM/SW Contact  Lavenia Atlas, RN Phone Number: 10/22/2023, 2:11 PM  Clinical Narrative:    Received secure chat from cardiology that patient requesting assistance with VA questions. This RNCM spoke with patient to advise Monique with VA housing/ [facility] did not approve housing for him as patient reported that he has housing w/his girlfriend however he does not wish to go back to girlfriends home. Patient is aware the VA does not provide medical services however he is eligible for housing only if approved, which patient has not been approved at this time.  Mr. Stmarie reports someone named Smitty Cords (ph# [phone] ext [phone]) called him and approved him for housing. This RNCM called the ph# provided by the patient which was an invalid extension. Patient now provides ph# [phone] [place] [phone]. This RNCM left voicemail message for a call back as this is the main phone number for VA homeless services. Will await return call. Per chart review plan for psych and palliative consult.   Substance abuse, mental health, shelter and social services resources have been attached to AVS. Patient reports he has a PCP named Dr. Bettey Costa however will look for another PCP on his own.  - 2:36 pm This RN CM received secure chat from [facility] that patient wants to speak with SW. This RNCM spoke with patient to explain my role with the [facility] team and SW is not assigned I will follow while he's in ICU. This RNCM explained to patient we have spoke a few times today and yesterday. This RNCM explained a voicemail requesting a call back was left at the phone # [phone] ext [phone] that patient provided. Awaiting a call back.   - 3:35pm this RNCM received call back from [facility] w/[facility] homeless services, [phone] ext [phone]). Bruce reports he has not  approved patient for any housing. Bruce reports Monique denied VA housing for Mr. Openshaw.   - 4:30pm This RNCM spoke with patient at bedside to advise Bruce works with Gabriel Rung with VA homeless/housing and did not approve housing. Patient is able to go to his mother's home or girlfriend's home.    TOC will continue to follow for discharge needs.    Expected Discharge Plan: Home/Self Care (unknown at this time) Barriers to Discharge: Continued Medical Work up  Expected Discharge Plan and Services   Discharge Planning Services: CM Consult Post Acute Care Choice: NA Living arrangements for the past 2 months: Homeless Shelter                 DME Arranged: N/A DME Agency: NA       HH Arranged: NA HH Agency: NA         Social Determinants of Health (SDOH) Interventions SDOH Screenings   Food Insecurity: Patient Unable To Answer (10/14/2023)  Recent Concern: Food Insecurity - Food Insecurity Present (07/21/2023)  Housing: Medium Risk (10/14/2023)  Transportation Needs: Unmet Transportation Needs (10/14/2023)  Utilities: Patient Unable To Answer (10/14/2023)  Alcohol Screen: High Risk (02/20/2022)  Depression (PHQ2-9): Medium Risk (05/05/2019)  Financial Resource Strain: High Risk (08/25/2023)  Tobacco Use: Low Risk  (10/14/2023)    Readmission Risk Interventions    10/20/2023    3:32 PM 10/19/2023    2:46 PM 02/18/2023   12:36 PM  Readmission Risk Prevention Plan  Transportation Screening Complete Complete Complete  PCP  or Specialist Appt within 3-5 Days   Complete  HRI or Home Care Consult   Complete  Social Work Consult for Recovery Care Planning/Counseling   Complete  Palliative Care Screening   Not Applicable  Medication Review [employer]) Complete  Complete  PCP or Specialist appointment within 3-5 days of discharge Complete --   HRI or Home Care Consult Complete Not Complete   HRI or Home Care Consult Pt Refusal Comments  Homeless   SW Recovery Care/Counseling  Consult Complete Complete   Palliative Care Screening Complete    Skilled Nursing Facility Not Applicable

## 2023-10-22 NOTE — Plan of Care (Signed)
  Problem: Activity: Goal: Risk for activity intolerance will decrease Outcome: Progressing   Problem: Nutrition: Goal: Adequate nutrition will be maintained Outcome: Progressing   Problem: Safety: Goal: Ability to remain free from injury will improve Outcome: Progressing   Problem: Coping: Goal: Level of anxiety will decrease Outcome: Not Progressing

## 2023-10-22 NOTE — Progress Notes (Signed)
Palliative:  HPI: 62 y.o. male with past medical history of HFrEF (EF < 20%) in setting of ICM with chronically occluded RCA, prior cocaine abuse, EtOH abuse, PTSD, depression. He presented with complaints of increased peripheral edema and dyspnea on exertion. He was admitted on 10/13/2023 with acute on chronic HFrEF, acute respiratory failure with hypoxia, AKI on CKD stage 3b.   I met today at Dale [name]'s bedside. He is sitting up in recliner and eating [place] sandwich. He is agitated immediately as he shares that he was just told that the [place] has declined to assist with housing. He is very frustrated and now expressing refusal of everything. He shares that he felt better before taking all these medications. I attempted to discuss the use of medications to assist his heart function and to help keep the fluid off. He denies this and tells me that the fluid only came because of his alcohol use and if he stays away from alcohol it will not come back. I did share that the alcohol dose make everything worse but we would expect the fluid to return due to his heart condition. Dale [name] does not agree. He is not willing to discuss. I do not argue with him. He tells me he wants to leave the hospital. I share that I will discuss with hospitalist as this decision is not up to me. He does give me permission to call and update his daughter.   I called and spoke with daughter, Dale [name]. I explain the situation and Dale [name]'s refusal of medications. Dale [name] is not surprised but she is concerned. I discussed with her his medications and explained their use. I explained to her also stopping milrinone as he has received the benefit of milrinone but I do anticipate that he will decline further and potentially very quickly - over days. I explain that his risk of mortality is very high. Dale [name] seems surprised with the severity of his condition and poor prognosis. She plans to call and speak with him with hopes that he will be more compliant with  medications.   I updated nursing staff and Dr. Rito Ehrlich.   All questions/concerns addressed to best of my ability. Emotional support provided.   Exam: Alert. Denial of health condition. Refusing medications and care. Aggressive and pressured speech. Not at all physically threatening or aggressive. Breathing regular, unlabored at rest with oxygen. Abd flat. Moves all extremities.   Plan: - Unable to address goals of care.  - Difficult situation with no good answers.   45  min  Yong Channel, NP Palliative Medicine Team Pager [phone] (Please see [email or web address] for schedule) Team Phone [phone]    Greater than 50%  of this time was spent counseling and coordinating care related to the above assessment and plan

## 2023-10-22 NOTE — Progress Notes (Signed)
[facility] PROGRESS NOTE   Dale [name] HQI:[id number] DOB: Aug 07, 1961 DOA: 10/13/2023  PCP: Pcp, No  Brief History: 62 y.o. male with medical history significant of HFrEF (EF < 20%) in setting of ICM with chronically occluded RCA as well as prior cocaine abuse and EtOH abuse.  Presented with  shortness of breath.  Found to have volume overload.  Cardiology was consulted started on Primacor drip as well as amiodarone drip with Lasix gtt. Now with worsening renal function Lasix is on hold. Cardiology following.  Palliative care consulted.    Consultants: Cardiology.  Palliative care.  Psychiatry  Procedures: None yet    Subjective/Interval History: Patient denies any chest pain.  Still with shortness of breath at times.  No further episodes of nausea and vomiting.  Tolerated her supper last night.  Waiting on his breakfast today.     Assessment/Plan:  Acute on chronic systolic CHF. NICM. Management by cardiology/advanced heart failure service. Patient on milrinone infusion.  Also on amiodarone infusion for NSVT. Patient was on a Lasix infusion as well.  Lasix currently on hold.  Started back on torsemide Management per cardiology.   Unable to use GDMT due to hypotension. Long-term prognosis is poor.  Nausea and vomiting Had multiple episodes of vomiting yesterday.  Abdominal film was unremarkable.  Symptoms likely due to CHF.  Seems to be better today.  Abdomen is benign.  Continue with PPI.   NSVT. Unable to tolerate beta-blocker due to blood pressure. Started on amiodarone.   Hypotension. Probably multifactorial including medication as well as CHF.  Patient noted to be on midodrine.  Midodrine dose was increased yesterday.  Blood pressure seems to be improving.  Acute kidney injury on CKD stage IIIb. Suspect cardiorenal hemodynamics. Treated with Lasix, now Lasix is on hold. Baseline serum creatinine around 1.4-1.7.   On admission serum creatinine  2.09.   Creatinine fluctuating between 1.5 and 1.8 now.  Nonobstructive CAD. Being treated medically. No chest pain. Aspirin and Lipitor.  Normocytic anemia Stable hemoglobin.  History of cocaine use. Counseled   Ongoing alcohol abuse disorder. Was on CIWA protocol. Currently out of withdrawal period. Has hallucinations both auditory as well as visual but not related to withdrawal. On thiamine.   Hypokalemia as well as hypocalcemia. Replaced.   Bilateral leg redness and swelling. Has been refusing DVT prophylaxis in the hospital. Lower EXTR Doppler negative for DVT   Acute respiratory failure with hypoxia. Required BiPAP.  Stable on oxygen right now.  Did not require BiPAP since admission.   Suicidal ideation. Psychiatry consulted. Patient have passive suicidal ideation.  Per psychiatry does not require sitter.  Per psychiatry does not require inpatient psychiatric admission.   Obesity Class 3 Body mass index is 31.95 kg/m.  Placing the pt at higher risk of poor outcomes.    Capacity concern. Goals of care conversation. Currently full code.  Seen by palliative care.  Patient very impulsive.  DVT Prophylaxis: On Lovenox Code Status: Full code Family Communication: Discussed with patient Disposition Plan: To be determined      Medications: Scheduled:  aspirin EC  81 mg Oral Daily   atorvastatin  40 mg Oral Daily   Chlorhexidine Gluconate Cloth  6 each Topical Q0600   clonazePAM  0.5 mg Oral BID   enoxaparin (LOVENOX) injection  40 mg Subcutaneous Q24H   folic acid  1 mg Oral Daily   insulin aspart  0-15 Units Subcutaneous TID WC   insulin aspart  0-5 Units  Subcutaneous QHS   midodrine  10 mg Oral TID WC   mirtazapine  7.5 mg Oral QHS   multivitamin with minerals  1 tablet Oral Daily   pantoprazole  40 mg Oral Q1200   potassium chloride  40 mEq Oral BID   sodium chloride flush  10-40 mL Intracatheter Q12H   thiamine  100 mg Oral Daily   torsemide  80 mg  Oral Daily   Continuous:  amiodarone 30 mg/hr (10/22/23 0219)   milrinone 0.125 mcg/kg/min (10/22/23 0219)   ZOX:[id number], melatonin, mouth rinse, prochlorperazine, sodium chloride flush  Antibiotics: Anti-infectives (From admission, onward)    None       Objective:  Vital Signs  Vitals:   10/22/23 0500 10/22/23 0700 10/22/23 0800 10/22/23 0900  BP:  (!) 97/44 112/74 (!) 137/96  Pulse:  76 83 87  Resp:  (!) 23 (!) 27 (!) 27  Temp:   (!) 97.4 F (36.3 C)   TempSrc:   Oral   SpO2:  93% 96% 98%  Weight: 102.1 kg     Height:        Intake/Output Summary (Last 24 hours) at 10/22/2023 1111 Last data filed at 10/22/2023 0430 Gross per 24 hour  Intake 1434.47 ml  Output 825 ml  Net 609.47 ml   Filed Weights   10/20/23 0609 10/21/23 0500 10/22/23 0500  Weight: 103.8 kg 104.2 kg 102.1 kg    General appearance: Awake alert.  In no distress Resp: Clear to auscultation bilaterally.  Normal effort Cardio: S1-S2 is normal regular.  No S3-S4.  No rubs murmurs or bruit GI: Abdomen is soft.  Nontender nondistended.  Bowel sounds are present normal.  No masses organomegaly    Lab Results:  Data Reviewed: I have personally reviewed following labs and reports of the imaging studies  CBC: Recent Labs  Lab 10/16/23 0521 10/17/23 0354 10/18/23 0408 10/19/23 0408 10/21/23 0535  WBC 7.1 7.2 6.2 4.5 4.6  HGB 12.7* 12.8* 13.2 9.5* 12.1*  HCT 37.5* 37.1* 38.7* 28.3* 36.1*  MCV 90.6 90.0 90.4 92.2 91.2  PLT 245 242 247 182 227    Basic Metabolic Panel: Recent Labs  Lab 10/17/23 2223 10/18/23 0408 10/18/23 1801 10/19/23 0408 10/19/23 0454 10/20/23 0600 10/20/23 1756 10/21/23 0535 10/21/23 1558 10/22/23 0425  NA 128*   < > 129*  130* 123*   < > 129* 130* 132* 137 131*  K 3.6   < > 3.8  3.7 3.2*   < > 3.7 4.4 4.3 4.2 4.0  CL 87*   < > 91*  91* 86*   < > 97* 95* 98 100 97*  CO2 30   < > 31  30 25    < > 24 25 25 26 24   GLUCOSE 155*   < > 105*  108* 393*    < > 93 219* 154* 126* 146*  BUN 57*   < > 50*  52* 43*   < > 39* 41* 41* 40* 39*  CREATININE 2.42*   < > 1.92*  1.92* 1.41*   < > 1.82* 1.76* 1.81* 1.58* 1.82*  CALCIUM 8.4*   < > 8.5*  8.4* 7.5*   < > 8.3* 8.4* 8.5* 8.6* 8.2*  MG  --    < > 2.9* 2.5*  --  2.8*  --  2.6*  --  2.3  PHOS 4.6  --  3.3  --   --  2.8 2.8  --  3.0  --    < > =  values in this interval not displayed.    GFR: Estimated Creatinine Clearance: 52.5 mL/min (A) (by C-G formula based on SCr of 1.82 mg/dL (H)).  Liver Function Tests: Recent Labs  Lab 10/17/23 2223 10/18/23 1801 10/20/23 0600 10/20/23 1756 10/21/23 1558  ALBUMIN 3.2* 3.2* 3.3* 3.3* 3.3*     CBG: Recent Labs  Lab 10/21/23 0732 10/21/23 1137 10/21/23 1604 10/21/23 2134 10/22/23 0753  GLUCAP 104* 97 83 103* 135*     Recent Results (from the past 240 hours)  MRSA Next Gen by PCR, Nasal     Status: None   Collection Time: 10/14/23  7:11 AM   Specimen: Nasal Mucosa; Nasal Swab  Result Value Ref Range Status   MRSA by PCR Next Gen NOT DETECTED NOT DETECTED Final    Comment: (NOTE) The GeneXpert MRSA Assay (FDA approved for NASAL specimens only), is one component of a comprehensive MRSA colonization surveillance program. It is not intended to diagnose MRSA infection nor to guide or monitor treatment for MRSA infections. Test performance is not FDA approved in patients less than 34 years old. Performed at [facility], [address], [place], [place] [address]       Radiology Studies: DG Abd Portable 1V Result Date: 10/21/2023 CLINICAL DATA:  [id number] Nausea and vomiting [id number] EXAM: PORTABLE ABDOMEN - 1 VIEW COMPARISON:  06/13/2023 FINDINGS: The bowel gas pattern is normal. No radio-opaque calculi or other significant radiographic abnormality are seen. Degenerative changes of both hips, left worse than right. IMPRESSION: Negative. Electronically Signed   By: Duanne Guess D.O.   On: 10/21/2023 19:33        LOS: 8 days   Tristy Udovich Rito Ehrlich  [facility] Pager on [email or web address]  10/22/2023, 11:11 AM

## 2023-10-22 NOTE — Plan of Care (Signed)
  Problem: Clinical Measurements: Goal: Ability to maintain clinical measurements within normal limits will improve Outcome: Progressing Goal: Will remain free from infection Outcome: Progressing Goal: Diagnostic test results will improve Outcome: Progressing Goal: Respiratory complications will improve Outcome: Progressing   Problem: Activity: Goal: Risk for activity intolerance will decrease Outcome: Progressing   Problem: Nutrition: Goal: Adequate nutrition will be maintained Outcome: Progressing   Problem: Coping: Goal: Level of anxiety will decrease Outcome: Progressing   Problem: Pain Management: Goal: General experience of comfort will improve Outcome: Progressing

## 2023-10-22 NOTE — Progress Notes (Addendum)
Patient Name: Dale [name] Date of Encounter: 10/22/2023 [facility] Cardiologist: Marca Ancona, MD   Interval Summary  .    Patient without any complaints of chest pain shortness of breath.  Vital Signs .    Vitals:   10/22/23 0215 10/22/23 0400 10/22/23 0500 10/22/23 0800  BP:  110/67    Pulse: 77 80    Resp: 13 20    Temp:  97.9 F (36.6 C)  (!) 97.4 F (36.3 C)  TempSrc:  Oral  Oral  SpO2: 92% 95%    Weight:   102.1 kg   Height:        Intake/Output Summary (Last 24 hours) at 10/22/2023 0815 Last data filed at 10/22/2023 0430 Gross per 24 hour  Intake 1772.04 ml  Output 825 ml  Net 947.04 ml      10/22/2023    5:00 AM 10/21/2023    5:00 AM 10/20/2023    6:09 AM  Last 3 Weights  Weight (lbs) 225 lb 1.4 oz 229 lb 11.5 oz 228 lb 13.4 oz  Weight (kg) 102.1 kg 104.2 kg 103.8 kg      Telemetry/ECG    Sinus rhythm 80s.- Personally Reviewed  CV Studies    Echocardiogram 10/16/2023  1. Left ventricular ejection fraction, by estimation, is <20%. The left  ventricle has severely decreased function. The left ventricle demonstrates  global hypokinesis. The left ventricular internal cavity size was severely  dilated. Left ventricular  diastolic parameters are consistent with Grade II diastolic dysfunction  (pseudonormalization).   2. Right ventricular systolic function is moderately reduced. The right  ventricular size is severely enlarged. There is normal pulmonary artery  systolic pressure.   3. Left atrial size was severely dilated.   4. Right atrial size was severely dilated.   5. The mitral valve is normal in structure. Mild mitral valve  regurgitation. No evidence of mitral stenosis.   6. The aortic valve is tricuspid. Aortic valve regurgitation is trivial.  No aortic stenosis is present.   7. The inferior vena cava is dilated in size with >50% respiratory  variability, suggesting right atrial pressure of 8 mmHg.   Comparison(s):  Changes from prior study are noted. Both RV and LV have  dilated further compared to prior studies. Mild functional MR and TR.   [facility] 03/2022; Right Heart Pressures R[facility] Procedural Findings: Hemodynamics (mmHg) RA mean 23 RV 50/21 PA 53/32, mean 40 PCWP mean 39 LV 117/35 AO 117/82  Oxygen saturations: PA 47% AO 97%  Cardiac Output (Fick) 3.5  Cardiac Index (Fick) 1.53 PVR < 1 WU    Prox LAD lesion is 15% stenosed.   Mid LAD lesion is 40% stenosed.   Prox RCA lesion is 100% stenosed.   2nd Mrg lesion is 30% stenosed.   3rd Mrg lesion is 20% stenosed.   1. No change in coronaries compared to prior cath, occluded proximal RCA with collaterals, nonobstructive disease in the left system.  2. Markedly elevated filling pressures.  3. Pulmonary venous hypertension.  4. Low cardiac output.      Physical Exam .   GEN: No acute distress.   Neck: No JVD Cardiac: RRR, no murmurs, rubs, or gallops.  Respiratory: Clear to auscultation bilaterally. GI: Soft, nontender, non-distended  MS: No edema  Assessment & Plan .     Acute on chronic combined HFrEF <20%, moderately reduced RV End-stage heart failure, low output Mixed nonischemic/ischemic cardiomyopathy Left heart cath in May 2023 showed chronic  occlusion of the RCA with collaterals, same as prior cath.  Right heart cath indicating low output with CI 1.53, CO 3.5.  Admitted and found to be markedly volume up with concerns of low output heart failure requiring milrinone.  Diuresed on Lasix drip and metolazone and held 10/17/2023 due to AKI, volume status now improved and off diuretics.  Euvolemic.  Not a candidate for advanced therapies secondary to substance abuse, poor compliance, and homelessness.  Currently trying to titrate down milrinone drip, and weaning to midodrine.  Difficult situation, attempted to wean down milrinone however co-ox continues to decline (69.6-->67.4-->50.8.)despite adding digoxin and midodrine.  CVP was 17  yesterday so torsemide was resumed.  Essentially very limited options and poor prognosis, with no further recommendations from advanced heart failure.  Discussed with patient today, he will process this information and MD will see later to discuss further.  On Farxiga and torsemide 80 mg.  Still holding BiDil, spironolactone. Attempting to wean down milrinone drip per MD, currently on midodrine 10 mg 3 times daily -17 L this admission.  Weight on admission 235.  Currently 225 and appears euvolemic.  Weight is increasing now though. Palliative care following  NSVT Still on IV amiodarone drip, seems to be stabilizing with no significant episodes noted on telemetry.  Not a candidate for ICD in the setting of cocaine and alcohol abuse.    CAD Left heart cath CTO of RCA treated medically with collaterals.  Left system okay.  No chest pain Continue aspirin atorvastatin 40 mg  Alcohol use Cocaine use Reportedly off of cocaine for 2 years.  UDS negative for cocaine here a positive for THC  CKD Renal function looks to be stable.  Difficult to determine baseline.  Suspect it is around 1.5-1.8.   For questions or updates, please contact  HeartCare Please consult [email or web address] for contact info under        Signed, Abagail Kitchens, PA-C

## 2023-10-22 NOTE — Discharge Summary (Addendum)
[facility]  Physician Discharge Summary   Patient ID: Dale [name] MRN: [id number] DOB/AGE: 07/16/1961 62 y.o.  Admit date: 10/13/2023 Discharge date:   10/22/2023   PCP: Pcp, No  DISCHARGE DIAGNOSES:  Principal Problem:   Acute on chronic HFrEF (heart failure with reduced ejection fraction) (HCC) Active Problems:   Acute kidney injury superimposed on chronic kidney disease (HCC)   Acute respiratory failure with hypoxia (HCC)   Alcohol use disorder, moderate, in early remission (HCC)   CKD (chronic kidney disease), stage III (HCC)   Acute on chronic systolic CHF (congestive heart failure) (HCC)   Hx of cocaine abuse (HCC)   Atherosclerosis of native coronary artery of native heart without angina pectoris   Cardiogenic shock (HCC)   Cardiorenal syndrome   RECOMMENDATIONS FOR OUTPATIENT FOLLOW UP: Cardiology to arrange outpatient follow up.   Home Health:none  Equipment/Devices:none   CODE STATUS:Full code   DISCHARGE CONDITION: stable  Diet recommendation: Heart healthy  INITIAL HISTORY: 62 y.o. male with medical history significant of HFrEF (EF < 20%) in setting of ICM with chronically occluded RCA as well as prior cocaine abuse and EtOH abuse.  Presented with  shortness of breath.  Found to have volume overload.  Cardiology was consulted started on Primacor drip as well as amiodarone drip with Lasix gtt. Now with worsening renal function Lasix is on hold. Cardiology following.  Palliative care consulted.     Consultants: Cardiology.  Palliative care.  Psychiatry   Procedures: None  HOSPITAL COURSE:   Acute on chronic systolic CHF. NICM. Management by cardiology/advanced heart failure service. Patient on milrinone infusion.  Also on amiodarone infusion for NSVT. Patient was on a Lasix infusion as well.  Lasix currently on hold.  Started back on torsemide   Unable to use GDMT due to hypotension. Long-term prognosis is poor. Patient unable to  reconcile with this. Milrinone was weaned off. No role for advanced CHF therapy per cardiology. He should ideally be hospice/comfort care.   Nausea and vomiting Had multiple episodes of vomiting yesterday.  Abdominal film was unremarkable.  Symptoms likely due to CHF.  Seems to be better today.  Abdomen is benign.  Continue with PPI.   NSVT. Unable to tolerate beta-blocker due to blood pressure. Started on amiodarone.   Hypotension. Probably multifactorial including medication as well as CHF.  Patient noted to be on midodrine.  Midodrine dose was increased yesterday.  Blood pressure seems to be improving.   Acute kidney injury on CKD stage IIIb. Suspect cardiorenal hemodynamics. Baseline serum creatinine around 1.4-1.7.   On admission serum creatinine 2.09.   Creatinine fluctuating between 1.5 and 1.8 now.   Nonobstructive CAD. Being treated medically. No chest pain.   Normocytic anemia Stable hemoglobin.   History of cocaine use. Counseled   Ongoing alcohol abuse disorder.    Hypokalemia as well as hypocalcemia. Replaced.   Bilateral leg redness and swelling. Has been refusing DVT prophylaxis in the hospital. Lower EXTR Doppler negative for DVT   Acute respiratory failure with hypoxia. Required BiPAP.  Stable on oxygen right now.  Did not require BiPAP since admission.   Suicidal ideation. Psychiatry consulted. Patient have passive suicidal ideation.  Per psychiatry does not require sitter.  Per psychiatry does not require inpatient psychiatric admission.   Obesity Class 3 Body mass index is 31.95 kg/m.  Placing the pt at higher risk of poor outcomes.   Patient wants to go home today. Palliative tried to discuss with regarding GOC but  patient was not receptive.   PERTINENT LABS:  The results of significant diagnostics from this hospitalization (including imaging, microbiology, ancillary and laboratory) are listed below for reference.    Microbiology: Recent  Results (from the past 240 hours)  MRSA Next Gen by PCR, Nasal     Status: None   Collection Time: 10/14/23  7:11 AM   Specimen: Nasal Mucosa; Nasal Swab  Result Value Ref Range Status   MRSA by PCR Next Gen NOT DETECTED NOT DETECTED Final    Comment: (NOTE) The GeneXpert MRSA Assay (FDA approved for NASAL specimens only), is one component of a comprehensive MRSA colonization surveillance program. It is not intended to diagnose MRSA infection nor to guide or monitor treatment for MRSA infections. Test performance is not FDA approved in patients less than 87 years old. Performed at [facility], [address] [place], [place] [address]      Labs:   Basic Metabolic Panel: Recent Labs  Lab 10/17/23 2223 10/18/23 0408 10/18/23 1801 10/19/23 0408 10/19/23 0454 10/20/23 0600 10/20/23 1756 10/21/23 0535 10/21/23 1558 10/22/23 0425  NA 128*   < > 129*  130* 123*   < > 129* 130* 132* 137 131*  K 3.6   < > 3.8  3.7 3.2*   < > 3.7 4.4 4.3 4.2 4.0  CL 87*   < > 91*  91* 86*   < > 97* 95* 98 100 97*  CO2 30   < > 31  30 25    < > 24 25 25 26 24   GLUCOSE 155*   < > 105*  108* 393*   < > 93 219* 154* 126* 146*  BUN 57*   < > 50*  52* 43*   < > 39* 41* 41* 40* 39*  CREATININE 2.42*   < > 1.92*  1.92* 1.41*   < > 1.82* 1.76* 1.81* 1.58* 1.82*  CALCIUM 8.4*   < > 8.5*  8.4* 7.5*   < > 8.3* 8.4* 8.5* 8.6* 8.2*  MG  --    < > 2.9* 2.5*  --  2.8*  --  2.6*  --  2.3  PHOS 4.6  --  3.3  --   --  2.8 2.8  --  3.0  --    < > = values in this interval not displayed.   Liver Function Tests: Recent Labs  Lab 10/17/23 2223 10/18/23 1801 10/20/23 0600 10/20/23 1756 10/21/23 1558  ALBUMIN 3.2* 3.2* 3.3* 3.3* 3.3*    CBC: Recent Labs  Lab 10/16/23 0521 10/17/23 0354 10/18/23 0408 10/19/23 0408 10/21/23 0535  WBC 7.1 7.2 6.2 4.5 4.6  HGB 12.7* 12.8* 13.2 9.5* 12.1*  HCT 37.5* 37.1* 38.7* 28.3* 36.1*  MCV 90.6 90.0 90.4 92.2 91.2  PLT 245 242 247 182 227      CBG: Recent Labs  Lab 10/21/23 1604 10/21/23 2134 10/22/23 0753 10/22/23 1155 10/22/23 1625  GLUCAP 83 103* 135* 162* 123*     IMAGING STUDIES DG Abd Portable 1V Result Date: 10/21/2023 CLINICAL DATA:  [id number] Nausea and vomiting [id number] EXAM: PORTABLE ABDOMEN - 1 VIEW COMPARISON:  06/13/2023 FINDINGS: The bowel gas pattern is normal. No radio-opaque calculi or other significant radiographic abnormality are seen. Degenerative changes of both hips, left worse than right. IMPRESSION: Negative. Electronically Signed   By: Duanne Guess D.O.   On: 10/21/2023 19:33   VAS [place] LOWER EXTREMITY VENOUS (DVT) Result Date: 10/16/2023  Lower Venous DVT Study Patient  Name:  Jeronimo Norma  Date of Exam:   10/16/2023 Medical Rec #: [id number]            Accession #:    [id number] Date of Birth: 08-09-1961            Patient Gender: M Patient Age:   59 years Exam Location:  [facility] Procedure:      VAS [place] LOWER EXTREMITY VENOUS (DVT) Referring Phys: PRANAV PATEL --------------------------------------------------------------------------------  Indications: Edema.  Risk Factors: None identified. Limitations: Poor ultrasound/tissue interface. Comparison Study: No prior studies. Performing Technologist: Chanda Busing RVT  Examination Guidelines: A complete evaluation includes B-mode imaging, spectral Doppler, color Doppler, and power Doppler as needed of all accessible portions of each vessel. Bilateral testing is considered an integral part of a complete examination. Limited examinations for reoccurring indications may be performed as noted. The reflux portion of the exam is performed with the patient in reverse Trendelenburg.  +---------+---------------+---------+-----------+----------+--------------+ RIGHT    CompressibilityPhasicitySpontaneityPropertiesThrombus Aging +---------+---------------+---------+-----------+----------+--------------+ CFV      Full           Yes      Yes                                  +---------+---------------+---------+-----------+----------+--------------+ SFJ      Full                                                        +---------+---------------+---------+-----------+----------+--------------+ FV Prox  Full                                                        +---------+---------------+---------+-----------+----------+--------------+ FV Mid   Full                                                        +---------+---------------+---------+-----------+----------+--------------+ FV DistalFull                                                        +---------+---------------+---------+-----------+----------+--------------+ PFV      Full                                                        +---------+---------------+---------+-----------+----------+--------------+ POP      Full           Yes      Yes                                 +---------+---------------+---------+-----------+----------+--------------+ PTV      Full                                                        +---------+---------------+---------+-----------+----------+--------------+  PERO     Full                                                        +---------+---------------+---------+-----------+----------+--------------+   +---------+---------------+---------+-----------+----------+-------------------+ LEFT     CompressibilityPhasicitySpontaneityPropertiesThrombus Aging      +---------+---------------+---------+-----------+----------+-------------------+ CFV      Full           Yes      Yes                                      +---------+---------------+---------+-----------+----------+-------------------+ SFJ      Full                                                             +---------+---------------+---------+-----------+----------+-------------------+ FV Prox  Full                                                              +---------+---------------+---------+-----------+----------+-------------------+ FV Mid   Full                                                             +---------+---------------+---------+-----------+----------+-------------------+ FV DistalFull           Yes      Yes                                      +---------+---------------+---------+-----------+----------+-------------------+ PFV      Full                                                             +---------+---------------+---------+-----------+----------+-------------------+ POP      Full           Yes      Yes                                      +---------+---------------+---------+-----------+----------+-------------------+ PTV      Full                                                             +---------+---------------+---------+-----------+----------+-------------------+ PERO  Not well visualized +---------+---------------+---------+-----------+----------+-------------------+     Summary: RIGHT: - There is no evidence of deep vein thrombosis in the lower extremity.  - No cystic structure found in the popliteal fossa.  LEFT: - There is no evidence of deep vein thrombosis in the lower extremity. However, portions of this examination were limited- see technologist comments above.  - No cystic structure found in the popliteal fossa.  *See table(s) above for measurements and observations. Electronically signed by Carolynn Sayers on 10/16/2023 at 1:50:05 PM.    Final    ECHOCARDIOGRAM COMPLETE Result Date: 10/16/2023    ECHOCARDIOGRAM REPORT   Patient Name:   JAHZEEL HOGAN Date of Exam: 10/16/2023 Medical Rec #:  [id number]           Height:       71.0 in Accession #:    [id number]          Weight:       233.9 lb Date of Birth:  [birth year]           BSA:          2.254 m Patient Age:    62 years            BP:           153/119  mmHg Patient Gender: M                   HR:           91 bpm. Exam Location:  Inpatient Procedure: 2D Echo, Cardiac Doppler and Color Doppler Indications:    CHF I50.21  History:        Patient has prior history of Echocardiogram examinations, most                 recent 02/19/2022. Ischemic CMO, CAD; Risk Factors:Hypertension                 and Dyslipidemia.  Sonographer:    Harriette Bouillon RDCS Referring Phys: [phone] DALTON S MCLEAN IMPRESSIONS  1. Left ventricular ejection fraction, by estimation, is <20%. The left ventricle has severely decreased function. The left ventricle demonstrates global hypokinesis. The left ventricular internal cavity size was severely dilated. Left ventricular diastolic parameters are consistent with Grade II diastolic dysfunction (pseudonormalization).  2. Right ventricular systolic function is moderately reduced. The right ventricular size is severely enlarged. There is normal pulmonary artery systolic pressure.  3. Left atrial size was severely dilated.  4. Right atrial size was severely dilated.  5. The mitral valve is normal in structure. Mild mitral valve regurgitation. No evidence of mitral stenosis.  6. The aortic valve is tricuspid. Aortic valve regurgitation is trivial. No aortic stenosis is present.  7. The inferior vena cava is dilated in size with >50% respiratory variability, suggesting right atrial pressure of 8 mmHg. Comparison(s): Changes from prior study are noted. Both RV and LV have dilated further compared to prior studies. Mild functional MR and TR. FINDINGS  Left Ventricle: Left ventricular ejection fraction, by estimation, is <20%. The left ventricle has severely decreased function. The left ventricle demonstrates global hypokinesis. The left ventricular internal cavity size was severely dilated. There is no left ventricular hypertrophy. Left ventricular diastolic parameters are consistent with Grade II diastolic dysfunction (pseudonormalization). Right Ventricle:  The right ventricular size is severely enlarged. No increase in right ventricular wall thickness. Right ventricular systolic function is moderately reduced. There is normal pulmonary artery systolic pressure. The tricuspid regurgitant velocity is 2.24  m/s, and with an assumed right atrial pressure of 8 mmHg, the estimated right ventricular systolic pressure is 28.1 mmHg. Left Atrium: Left atrial size was severely dilated. Right Atrium: Right atrial size was severely dilated. Pericardium: There is no evidence of pericardial effusion. Mitral Valve: The mitral valve is normal in structure. Mild mitral valve regurgitation. No evidence of mitral valve stenosis. Tricuspid Valve: The tricuspid valve is normal in structure. Tricuspid valve regurgitation is mild . No evidence of tricuspid stenosis. Aortic Valve: The aortic valve is tricuspid. Aortic valve regurgitation is trivial. No aortic stenosis is present. Pulmonic Valve: The pulmonic valve was grossly normal. Pulmonic valve regurgitation is trivial. No evidence of pulmonic stenosis. Aorta: The aortic root, ascending aorta, aortic arch and descending aorta are all structurally normal, with no evidence of dilitation or obstruction. Venous: The inferior vena cava is dilated in size with greater than 50% respiratory variability, suggesting right atrial pressure of 8 mmHg. IAS/Shunts: The atrial septum is grossly normal.  LEFT VENTRICLE PLAX 2D LVIDd:         9.30 cm      Diastology LVIDs:         8.60 cm      LV e' medial:    4.13 cm/s LV PW:         0.90 cm      LV E/e' medial:  23.0 LV IVS:        0.80 cm      LV e' lateral:   5.87 cm/s LVOT diam:     2.30 cm      LV E/e' lateral: 16.2 LV SV:         54 LV SV Index:   24 LVOT Area:     4.15 cm  LV Volumes (MOD) LV vol d, MOD A2C: 519.0 ml LV vol d, MOD A4C: 401.0 ml LV vol s, MOD A2C: 406.0 ml LV vol s, MOD A4C: 317.0 ml LV SV MOD A2C:     113.0 ml LV SV MOD A4C:     401.0 ml LV SV MOD BP:      106.6 ml RIGHT VENTRICLE             IVC RV Basal diam:  5.41 cm    IVC diam: 2.30 cm RV S prime:     7.51 cm/s TAPSE (M-mode): 1.2 cm LEFT ATRIUM            Index LA diam:      5.80 cm  2.57 cm/m LA Vol (A4C): 152.0 ml 67.45 ml/m  AORTIC VALVE LVOT Vmax:   84.40 cm/s LVOT Vmean:  55.800 cm/s LVOT VTI:    0.131 m  AORTA Ao Root diam: 3.50 cm MITRAL VALVE               TRICUSPID VALVE MV Area (PHT): 7.74 cm    TR Peak grad:   20.1 mmHg MV Decel Time: 98 msec     TR Vmax:        224.00 cm/s MV E velocity: 95.10 cm/s MV A velocity: 58.60 cm/s  SHUNTS MV E/A ratio:  1.62        Systemic VTI:  0.13 m                            Systemic Diam: 2.30 cm Jodelle Red MD Electronically signed by Jodelle Red MD Signature Date/Time: 10/16/2023/11:51:58 AM    Final  [place] EKG SITE RITE Result Date: 10/15/2023 If Site Rite image not attached, placement could not be confirmed due to current cardiac rhythm.  [place] EKG SITE RITE Result Date: 10/14/2023 If Site Rite image not attached, placement could not be confirmed due to current cardiac rhythm.  DG Chest 2 View Result Date: 10/13/2023 CLINICAL DATA:  Shortness of breath. EXAM: CHEST - 2 VIEW COMPARISON:  Chest radiograph dated 10/10/2023. FINDINGS: Cardiomegaly with mild central vascular congestion. No focal consolidation, pleural effusion, or pneumothorax. No acute osseous pathology. IMPRESSION: Cardiomegaly with mild central vascular congestion. Electronically Signed   By: Elgie Collard M.D.   On: 10/13/2023 19:39   DG Chest 2 View Result Date: 10/10/2023 CLINICAL DATA:  Shortness of breath.  Orthopnea. EXAM: CHEST - 2 VIEW COMPARISON:  02/16/2023 FINDINGS: Prominent diffuse cardiac enlargement. Mild central vascular congestion. No airspace disease, edema, or consolidation. No pleural effusions. No pneumothorax. No significant change. IMPRESSION: Diffuse cardiac enlargement similar to prior study. Mild central vascular congestion. No consolidation or edema in the lungs.  Electronically Signed   By: Burman Nieves M.D.   On: 10/10/2023 03:22    DISCHARGE EXAMINATION: See progress note from earlier today  DISPOSITION: home  Discharge Instructions     (HEART FAILURE PATIENTS) Call MD:  Anytime you have any of the following symptoms: 1) 3 pound weight gain in 24 hours or 5 pounds in 1 week 2) shortness of breath, with or without a dry hacking cough 3) swelling in the hands, feet or stomach 4) if you have to sleep on extra pillows at night in order to breathe.   Complete by: As directed    Call MD for:  difficulty breathing, headache or visual disturbances   Complete by: As directed    Call MD for:  extreme fatigue   Complete by: As directed    Call MD for:  persistant dizziness or light-headedness   Complete by: As directed    Call MD for:  persistant nausea and vomiting   Complete by: As directed    Call MD for:  severe uncontrolled pain   Complete by: As directed    Call MD for:  temperature >100.4   Complete by: As directed    Diet - low sodium heart healthy   Complete by: As directed    Discharge instructions   Complete by: As directed    Please take your medications as prescribed. Follow up with your outpatient doctors.  You were cared for by a hospitalist during your hospital stay. If you have any questions about your discharge medications or the care you received while you were in the hospital after you are discharged, you can call the unit and asked to speak with the hospitalist on call if the hospitalist that took care of you is not available. Once you are discharged, your primary care physician will handle any further medical issues. Please note that NO REFILLS for any discharge medications will be authorized once you are discharged, as it is imperative that you return to your primary care physician (or establish a relationship with a primary care physician if you do not have one) for your aftercare needs so that they can reassess your need for  medications and monitor your lab values. If you do not have a primary care physician, you can call [phone] for a physician referral.   Increase activity slowly   Complete by: As directed          Allergies as of 10/22/2023  Reactions   Hydrocodone Itching        Medication List     STOP taking these medications    dapagliflozin propanediol 10 MG Tabs tablet Commonly known as: Farxiga   digoxin 0.125 MG tablet Commonly known as: LANOXIN   famotidine 20 MG tablet Commonly known as: PEPCID   metolazone 2.5 MG tablet Commonly known as: ZAROXOLYN   spironolactone 25 MG tablet Commonly known as: ALDACTONE       TAKE these medications    amiodarone 400 MG tablet Commonly known as: PACERONE Take 1 tablet (400 mg total) by mouth daily. Start taking on: October 23, 2023   aspirin EC 81 MG tablet Take 1 tablet (81 mg total) by mouth daily. Swallow whole.   atorvastatin 40 MG tablet Commonly known as: LIPITOR Take 1 tablet (40 mg total) by mouth daily.   midodrine 10 MG tablet Commonly known as: PROAMATINE Take 1 tablet (10 mg total) by mouth 3 (three) times daily with meals. Start taking on: October 23, 2023   mirtazapine 15 MG disintegrating tablet Commonly known as: REMERON SOL-TAB Take 0.5 tablets (7.5 mg total) by mouth at bedtime.   pantoprazole 40 MG tablet Commonly known as: PROTONIX Take 1 tablet (40 mg total) by mouth daily.   potassium chloride SA 20 MEQ tablet Commonly known as: KLOR-CON M Take 40 mEq by mouth 2 (two) times daily. What changed: Another medication with the same name was removed. Continue taking this medication, and follow the directions you see here.   sildenafil 50 MG tablet Commonly known as: Viagra Take 1 tablet (50 mg total) by mouth daily as needed for erectile dysfunction.   Torsemide 40 MG Tabs Take 80 mg by mouth 2 (two) times daily. What changed:  medication strength when to take this Another medication  with the same name was removed. Continue taking this medication, and follow the directions you see here.          Follow-up Information     [facility]. Call in 1 day(s).   Why: After DC please follow up with care provdier. Contact information: [address] [place] [address] [phone]                TOTAL DISCHARGE TIME: 35 mins  Jahmil Macleod Rito Ehrlich  [facility] Pager on [email or web address]  10/22/2023, 5:01 PM
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
## 2023-10-23 ENCOUNTER — Inpatient Hospital Stay (HOSPITAL_COMMUNITY): Payer: MEDICAID

## 2023-10-23 DIAGNOSIS — I2510 Atherosclerotic heart disease of native coronary artery without angina pectoris: Secondary | ICD-10-CM | POA: Diagnosis not present

## 2023-10-23 DIAGNOSIS — R570 Cardiogenic shock: Secondary | ICD-10-CM | POA: Diagnosis not present

## 2023-10-23 DIAGNOSIS — I4729 Other ventricular tachycardia: Secondary | ICD-10-CM | POA: Diagnosis not present

## 2023-10-23 DIAGNOSIS — I130 Hypertensive heart and chronic kidney disease with heart failure and stage 1 through stage 4 chronic kidney disease, or unspecified chronic kidney disease: Secondary | ICD-10-CM | POA: Diagnosis not present

## 2023-10-23 DIAGNOSIS — N179 Acute kidney failure, unspecified: Secondary | ICD-10-CM | POA: Diagnosis not present

## 2023-10-23 DIAGNOSIS — F1021 Alcohol dependence, in remission: Secondary | ICD-10-CM | POA: Diagnosis not present

## 2023-10-23 DIAGNOSIS — I5023 Acute on chronic systolic (congestive) heart failure: Secondary | ICD-10-CM | POA: Diagnosis not present

## 2023-10-23 LAB — CBC WITH DIFFERENTIAL/PLATELET
Abs Immature Granulocytes: 0.01 10*3/uL (ref 0.00–0.07)
Basophils Absolute: 0 10*3/uL (ref 0.0–0.1)
Basophils Relative: 1 %
Eosinophils Absolute: 0.1 10*3/uL (ref 0.0–0.5)
Eosinophils Relative: 3 %
HCT: 41 % (ref 39.0–52.0)
Hemoglobin: 13.5 g/dL (ref 13.0–17.0)
Immature Granulocytes: 0 %
Lymphocytes Relative: 21 %
Lymphs Abs: 1 10*3/uL (ref 0.7–4.0)
MCH: 30.3 pg (ref 26.0–34.0)
MCHC: 32.9 g/dL (ref 30.0–36.0)
MCV: 91.9 fL (ref 80.0–100.0)
Monocytes Absolute: 0.6 10*3/uL (ref 0.1–1.0)
Monocytes Relative: 12 %
Neutro Abs: 3.1 10*3/uL (ref 1.7–7.7)
Neutrophils Relative %: 63 %
Platelets: 293 10*3/uL (ref 150–400)
RBC: 4.46 MIL/uL (ref 4.22–5.81)
RDW: 14.9 % (ref 11.5–15.5)
WBC: 4.9 10*3/uL (ref 4.0–10.5)
nRBC: 0 % (ref 0.0–0.2)

## 2023-10-23 LAB — COMPREHENSIVE METABOLIC PANEL
ALT: 27 U/L (ref 0–44)
AST: 31 U/L (ref 15–41)
Albumin: 4.1 g/dL (ref 3.5–5.0)
Alkaline Phosphatase: 104 U/L (ref 38–126)
Anion gap: 10 (ref 5–15)
BUN: 49 mg/dL — ABNORMAL HIGH (ref 6–20)
CO2: 23 mmol/L (ref 22–32)
Calcium: 9.3 mg/dL (ref 8.9–10.3)
Chloride: 101 mmol/L (ref 98–111)
Creatinine, Ser: 2.19 mg/dL — ABNORMAL HIGH (ref 0.61–1.24)
GFR, Estimated: 34 mL/min — ABNORMAL LOW (ref >60)
Glucose, Bld: 142 mg/dL — ABNORMAL HIGH (ref 70–99)
Potassium: 5 mmol/L (ref 3.5–5.1)
Sodium: 134 mmol/L — ABNORMAL LOW (ref 135–145)
Total Bilirubin: 1.1 mg/dL (ref <1.2)
Total Protein: 8.6 g/dL — ABNORMAL HIGH (ref 6.5–8.1)

## 2023-10-23 LAB — RENAL FUNCTION PANEL
Albumin: 4.2 g/dL (ref 3.5–5.0)
Anion gap: 12 (ref 5–15)
BUN: 52 mg/dL — ABNORMAL HIGH (ref 6–20)
CO2: 19 mmol/L — ABNORMAL LOW (ref 22–32)
Calcium: 9.1 mg/dL (ref 8.9–10.3)
Chloride: 100 mmol/L (ref 98–111)
Creatinine, Ser: 2.54 mg/dL — ABNORMAL HIGH (ref 0.61–1.24)
GFR, Estimated: 28 mL/min — ABNORMAL LOW (ref >60)
Glucose, Bld: 145 mg/dL — ABNORMAL HIGH (ref 70–99)
Phosphorus: 3.8 mg/dL (ref 2.5–4.6)
Potassium: 5.9 mmol/L — ABNORMAL HIGH (ref 3.5–5.1)
Sodium: 131 mmol/L — ABNORMAL LOW (ref 135–145)

## 2023-10-23 LAB — GLUCOSE, CAPILLARY
Glucose-Capillary: 150 mg/dL — ABNORMAL HIGH (ref 70–99)
Glucose-Capillary: 130 mg/dL — ABNORMAL HIGH (ref 70–99)
Glucose-Capillary: 136 mg/dL — ABNORMAL HIGH (ref 70–99)
Glucose-Capillary: 137 mg/dL — ABNORMAL HIGH (ref 70–99)

## 2023-10-23 LAB — MAGNESIUM: Magnesium: 2.8 mg/dL — ABNORMAL HIGH (ref 1.7–2.4)

## 2023-10-23 MED ORDER — PROMETHAZINE HCL 25 MG PO TABS
25.0000 mg | ORAL_TABLET | Freq: Once | ORAL | Status: AC
  Administered 2023-10-23: 25 mg via ORAL
  Filled 2023-10-23: qty 1

## 2023-10-23 MED ORDER — ONDANSETRON HCL 4 MG/2ML IJ SOLN: 4.0000 mg | Freq: Four times a day (QID) | INTRAMUSCULAR | Status: DC | PRN

## 2023-10-23 MED ORDER — PROCHLORPERAZINE EDISYLATE 10 MG/2ML IJ SOLN: 10.0000 mg | Freq: Four times a day (QID) | INTRAMUSCULAR | Status: DC | PRN

## 2023-10-23 MED ORDER — BISACODYL 10 MG RE SUPP
10.0000 mg | Freq: Once | RECTAL | Status: DC
  Filled 2023-10-23: qty 1

## 2023-10-23 MED ORDER — SUCRALFATE 1 GM/10ML PO SUSP
1.0000 g | Freq: Two times a day (BID) | ORAL | Status: DC
  Administered 2023-10-23: 1 g via ORAL
  Filled 2023-10-23: qty 10

## 2023-10-23 MED ORDER — DIGOXIN 125 MCG PO TABS
0.1250 mg | ORAL_TABLET | Freq: Every day | ORAL | Status: DC
  Administered 2023-10-23: 0.125 mg via ORAL
  Filled 2023-10-23: qty 1

## 2023-10-23 MED ORDER — PROCHLORPERAZINE EDISYLATE 10 MG/2ML IJ SOLN
10.0000 mg | Freq: Four times a day (QID) | INTRAMUSCULAR | Status: DC | PRN
  Administered 2023-10-23 – 2023-11-01 (×5): 10 mg via INTRAVENOUS
  Filled 2023-10-23 (×5): qty 2

## 2023-10-23 MED ORDER — AMIODARONE HCL 200 MG PO TABS: 200.0000 mg | ORAL_TABLET | Freq: Every day | ORAL | Status: DC

## 2023-10-23 MED ORDER — LORAZEPAM 2 MG/ML IJ SOLN
0.5000 mg | Freq: Once | INTRAMUSCULAR | Status: AC
  Administered 2023-10-24: 0.5 mg via INTRAVENOUS
  Filled 2023-10-23: qty 1

## 2023-10-23 MED ORDER — MELATONIN 5 MG PO TABS
5.0000 mg | ORAL_TABLET | Freq: Every evening | ORAL | Status: DC | PRN
  Administered 2023-10-23: 5 mg via ORAL
  Filled 2023-10-23: qty 1

## 2023-10-23 MED ORDER — PANTOPRAZOLE SODIUM 40 MG IV SOLR
40.0000 mg | Freq: Once | INTRAVENOUS | Status: AC
  Administered 2023-10-23: 40 mg via INTRAVENOUS
  Filled 2023-10-23: qty 10

## 2023-10-23 NOTE — Plan of Care (Signed)
Patient was going to leave last night AMA and family convinced to stay due to poor patient status.  He gets very short of breath with ambulation and take a lot of time to recover.  Significant nausea and vomiting.

## 2023-10-23 NOTE — Progress Notes (Signed)
Patient has refused to wear heart monitor after transfer to unit.

## 2023-10-23 NOTE — Progress Notes (Signed)
Patient laying at foot of the bed in fetal position, refusing to lay in bed normally. Continued to advocate for his safety, but patient insists he is fine and to leave him alone. Patient continues to refuse nursing care, refusing telemetry, gets up without assistance, and refusing bed alarm. Fall mats are in place.

## 2023-10-23 NOTE — Plan of Care (Signed)
  Problem: Education: Goal: Knowledge of General Education information will improve Description: Including pain rating scale, medication(s)/side effects and non-pharmacologic comfort measures Outcome: Progressing   Problem: Clinical Measurements: Goal: Respiratory complications will improve Outcome: Progressing   Problem: Coping: Goal: Level of anxiety will decrease Outcome: Progressing   

## 2023-10-23 NOTE — Progress Notes (Signed)
[facility] Progress Note Patient: Dale [name] WUJ:[id number] DOB: Aug 09, 1961 DOA: 10/13/2023  DOS: the patient was seen and examined on 10/23/2023  Brief Hospital Course: Brief hospital course: Marreo Uselman is a 62 y.o. male with medical history significant of HFrEF (EF < 20%) in setting of ICM with chronically occluded RCA as well as prior cocaine abuse and EtOH abuse.  Presented with  shortness of breath.  Found to have volume overload.  Cardiology was consulted started on Primacor drip as well as amiodarone drip with Lasix gtt. Now with worsening renal function Lasix is on hold. Cardiology following.  Palliative care consulted.  Prognosis poor. Originally wanted to go home on 12/18 and was discharged.  Later on refused to leave and family also convince the patient to stay. On 12/19 patient had multiple episodes of vomiting requiring IV therapy and therefore recommended to remain in the hospital.   Assessment and Plan: Acute on chronic systolic CHF. NICM. Alcohol and cocaine abuse. Management by cardiology/advanced heart failure service. Was on Primacor.  Was on Lasix gtt.  Due to NSVT on amiodarone gtt. Due to worsening renal function Lasix on hold. Was started on Zaroxolyn, BiDil, Aldactone although due to poor blood pressure currently these medications are on hold. Continue Farxiga. Per cardiology not a candidate for any aggressive or long-term therapy. Prognosis is poor. Currently on oral therapy.  Monitor.   Nonobstructive CAD. Being treated medically. No chest pain. Aspirin and Lipitor.  NSVT. With [employer].  As well as due to low EF. Unable to tolerate beta-blocker due to blood pressure. Started on amiodarone.  Hypotension. Blood pressure is now running on the lower side. Discussed with cardiology, recommend to continue Primacor. Midodrine added.  Continue. Target for MAP more than 60 as long as patient asymptomatic.   History of cocaine  use. Currently does not abuse. Monitor.   Ongoing alcohol abuse disorder. Was on CIWA protocol. Currently out of typical withdrawal period. Has hallucinations both auditory as well as visual but not related to withdrawal. On thiamine.   Acute kidney injury on CKD stage IIIb. Suspect cardiorenal hemodynamics. Treated with Lasix, now Lasix is on hold. Baseline serum creatinine around 1.4-1.7.  On admission serum creatinine 2.09.  Improved to 1.4 and now worsening again.  Intractable nausea and vomiting. Etiology not clear. X-ray unremarkable. Will monitor.  IV Zofran.   Hypokalemia as well as hypocalcemia. Replaced.    Bilateral leg redness and swelling. Has been refusing DVT prophylaxis in the hospital. Lower EXTR Doppler negative for DVT   Acute respiratory failure with hypoxia. Required BiPAP.  Stable on oxygen right now.  Did not require BiPAP since admission.   Suicidal ideation. Psychiatry consulted. Patient have passive suicidal ideation.  Per psychiatry does not require sitter.   Telesitter can be added if safety concerns arise again. Was on Klonopin.  Will reduce the dose due to hypotension. Patient can be discharged home from psychiatry point of view.  Does not require inpatient psych admission.   Obesity Class 3 Body mass index is 31.95 kg/m.  Placing the pt at higher risk of poor outcomes.    Capacity concern. Goals of care conversation. Currently full code. With his ongoing psychiatric issues unsure if the patient actually has capacity to understand his medical condition and navigate complex medical decision making. Discussed in detail with the patient as well as daughter earlier. Repeat conversation with the daughter on 12/15. Repeat correction with the daughter on 12/19. Daughter wants the patient to remain  full code as she believes that she has a connection with Christ and "he" will decide patient's fate. She also believes that the patient's poor  prognosis and recommendation to consider comfort approach is not 100% given outcome of patient's condition. I have recommended her that if she at the patient wants to remain full code with the plan to be resuscitated, patient's constant refusal of care in the hospital is not going to lead [place] to favorable outcomes.  Nor patient's housing situation will help the patient to have favorable outcomes. She stands that and wants to continue full code and will ensure that the father had adequate support outside of the hospital.   Subjective: Moved from stepdown to telemetry.  Reports ongoing nausea as well as multiple episodes of vomiting.  No blood in the vomit.  Appears to be clear now.  Reports shortness of breath. Physical Exam: General: in Mild distress, No Rash Cardiovascular: S1 and S2 Present, No Murmur Respiratory: Good respiratory effort, Bilateral Air entry present. No Crackles, No wheezes Abdomen: Bowel Sound present, No tenderness Extremities: Improving edema Neuro: Alert and oriented x3, no new focal deficit  Data Reviewed: I have Reviewed nursing notes, Vitals, and Lab results. Since last encounter, pertinent lab results CBC and BMP   . I have ordered test including CBC and BMP  . I have ordered imaging x-ray chest and x-ray abdomen  .   Disposition: Status is: Inpatient Remains inpatient appropriate because: Monitor for improvement in renal function and oral intake.  enoxaparin (LOVENOX) injection 40 mg Start: 10/15/23 1000   Family Communication: Discussed with daughter on the phone Level of care: Telemetry   Vitals:   10/23/23 1200 10/23/23 1351 10/23/23 1705 10/23/23 1800  BP:  (!) 111/91    Pulse:  91    Resp:  20    Temp: (!) 97.4 F (36.3 C) 97.6 F (36.4 C)    TempSrc: Oral Oral    SpO2:  100% 98% 98%  Weight:      Height:         Author: Lynden Oxford, MD 10/23/2023 6:58 PM  Please look on [email or web address] to find out who is on call.

## 2023-10-23 NOTE — Progress Notes (Signed)
Patient Name: Dale [name] Date of Encounter: 10/23/2023 [facility] Cardiologist: Marca Ancona, MD   Interval Summary  .    See below for further details.  He reports more shortness of breath and difficulty breathing.  Initially was planned for discharge yesterday however has been refusing palliative care consult, medications and IV access.  At this point he has been only willing to take his torsemide and midodrine.  Vital Signs .    Vitals:   10/23/23 0000 10/23/23 0338 10/23/23 0500 10/23/23 0800  BP:      Pulse:      Resp:      Temp: (!) 97.5 F (36.4 C) 98.2 F (36.8 C)  98.2 F (36.8 C)  TempSrc: Oral Oral  Oral  SpO2:      Weight:   102 kg   Height:        Intake/Output Summary (Last 24 hours) at 10/23/2023 0949 Last data filed at 10/22/2023 1400 Gross per 24 hour  Intake 959.75 ml  Output 625 ml  Net 334.75 ml      10/23/2023    5:00 AM 10/22/2023    5:00 AM 10/21/2023    5:00 AM  Last 3 Weights  Weight (lbs) 224 lb 13.9 oz 225 lb 1.4 oz 229 lb 11.5 oz  Weight (kg) 102 kg 102.1 kg 104.2 kg      Telemetry/ECG    Sinus rhythm 80s.- Personally Reviewed  CV Studies    Echocardiogram 10/16/2023  1. Left ventricular ejection fraction, by estimation, is <20%. The left  ventricle has severely decreased function. The left ventricle demonstrates  global hypokinesis. The left ventricular internal cavity size was severely  dilated. Left ventricular  diastolic parameters are consistent with Grade II diastolic dysfunction  (pseudonormalization).   2. Right ventricular systolic function is moderately reduced. The right  ventricular size is severely enlarged. There is normal pulmonary artery  systolic pressure.   3. Left atrial size was severely dilated.   4. Right atrial size was severely dilated.   5. The mitral valve is normal in structure. Mild mitral valve  regurgitation. No evidence of mitral stenosis.   6. The aortic valve is  tricuspid. Aortic valve regurgitation is trivial.  No aortic stenosis is present.   7. The inferior vena cava is dilated in size with >50% respiratory  variability, suggesting right atrial pressure of 8 mmHg.   Comparison(s): Changes from prior study are noted. Both RV and LV have  dilated further compared to prior studies. Mild functional MR and TR.   [facility] 03/2022; Right Heart Pressures [facility] Procedural Findings: Hemodynamics (mmHg) RA mean 23 RV 50/21 PA 53/32, mean 40 PCWP mean 39 LV 117/35 AO 117/82  Oxygen saturations: PA 47% AO 97%  Cardiac Output (Fick) 3.5  Cardiac Index (Fick) 1.53 PVR < 1 WU    Prox LAD lesion is 15% stenosed.   Mid LAD lesion is 40% stenosed.   Prox RCA lesion is 100% stenosed.   2nd Mrg lesion is 30% stenosed.   3rd Mrg lesion is 20% stenosed.   1. No change in coronaries compared to prior cath, occluded proximal RCA with collaterals, nonobstructive disease in the left system.  2. Markedly elevated filling pressures.  3. Pulmonary venous hypertension.  4. Low cardiac output.      Physical Exam .   GEN: No acute distress.   Neck: No JVD Cardiac: RRR, no murmurs, rubs, or gallops.  Respiratory: Clear to auscultation bilaterally. GI:  Soft, nontender, non-distended  MS: No edema  Assessment & Plan .     Patient initially had plans for discharge tomorrow however this has been complicated by patient's unwillingness to be discharged.  At this time he has been refusing essentially all lab draws, medications, etc.  Discussed in great detail patient has limited options, need for his medications, and reiterated the need for palliative care.  He reports now that he is willing to see palliative care to further outline goals of care, plan, and what medications he is willing to take.  Will provide further recommendations after this.  I have encouraged him to call his daughter and his mother to collectively discuss with palliative care.  Nurse will message  palliative care to hopefully see today.  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  Right heart cath indicating low output with CI 1.53, CO 3.5.  Admitted and found to be markedly volume up with concerns of low output heart failure requiring milrinone.  Diuresed on Lasix drip and metolazone and held 10/17/2023 due to AKI. Euvolemic.  Not a candidate for advanced therapies secondary to substance abuse, poor compliance, and homelessness.  He has decreasing co-ox and increasing CVP.  Very poor prognosis  On  torsemide 80 mg BID.   Off milrinone drip.  Currently on midodrine 10 mg 3 times daily and dig  NSVT Transitioned to p.o. amiodarone 400 daily.  Not a candidate for ICD in the setting of cocaine and alcohol abuse.    CAD Left heart cath CTO of RCA treated medically with collaterals.  Left system okay.  No chest pain Continue aspirin atorvastatin 40 mg  Alcohol use Cocaine use Reportedly off of cocaine for 2 years.  UDS negative for cocaine here a positive for THC  CKD Appears worsening no new labs  For questions or updates, please contact [facility] Please consult [email or web address] for contact info under        Signed, Abagail Kitchens, PA-C

## 2023-10-23 NOTE — Plan of Care (Signed)
  Problem: Clinical Measurements: Goal: Ability to maintain clinical measurements within normal limits will improve Outcome: Adequate for Discharge Goal: Diagnostic test results will improve Outcome: Adequate for Discharge   Problem: Activity: Goal: Risk for activity intolerance will decrease Outcome: Adequate for Discharge   Problem: Nutrition: Goal: Adequate nutrition will be maintained Outcome: Adequate for Discharge   Problem: Elimination: Goal: Will not experience complications related to bowel motility Outcome: Adequate for Discharge Goal: Will not experience complications related to urinary retention Outcome: Adequate for Discharge   Problem: Pain Management: Goal: General experience of comfort will improve Outcome: Adequate for Discharge   Problem: Skin Integrity: Goal: Risk for impaired skin integrity will decrease Outcome: Adequate for Discharge   Problem: Metabolic: Goal: Ability to maintain appropriate glucose levels will improve Outcome: Adequate for Discharge   Problem: Nutritional: Goal: Maintenance of adequate nutrition will improve Outcome: Adequate for Discharge Goal: Progress toward achieving an optimal weight will improve Outcome: Adequate for Discharge   Problem: Skin Integrity: Goal: Risk for impaired skin integrity will decrease Outcome: Adequate for Discharge   Problem: Tissue Perfusion: Goal: Adequacy of tissue perfusion will improve Outcome: Adequate for Discharge

## 2023-10-23 NOTE — Progress Notes (Signed)
Palliative:  Noted worsening symptoms today. I discussed with Dr. Allena Katz who had extensive discussion with Dale [name] along with daughter, Dale [name], who continued to desire full code, full scope. Unfortunately prognosis is very poor. Dr. Allena Katz reports no need for further conversations from palliative today since he has already had extensive conversation.   No charge  Yong Channel, NP Palliative Medicine Team Pager [phone] (Please see [email or web address] for schedule) Team Phone [phone]

## 2023-10-24 ENCOUNTER — Inpatient Hospital Stay (HOSPITAL_COMMUNITY): Payer: MEDICAID

## 2023-10-24 DIAGNOSIS — I5023 Acute on chronic systolic (congestive) heart failure: Secondary | ICD-10-CM | POA: Diagnosis not present

## 2023-10-24 DIAGNOSIS — I469 Cardiac arrest, cause unspecified: Secondary | ICD-10-CM

## 2023-10-24 LAB — BLOOD GAS, ARTERIAL
Acid-base deficit: 16 mmol/L — ABNORMAL HIGH (ref 0.0–2.0)
Bicarbonate: 13.9 mmol/L — ABNORMAL LOW (ref 20.0–28.0)
Drawn by: 20012
FIO2: 50 %
MECHVT: 600 mL
O2 Content: 50 L/min
O2 Saturation: 98.3 %
PEEP: 5 cmH2O
Patient temperature: 36.2
RATE: 24 {breaths}/min
pCO2 arterial: 45 mm[Hg] (ref 32–48)
pH, Arterial: 7.09 — CL (ref 7.35–7.45)
pO2, Arterial: 103 mm[Hg] (ref 83–108)

## 2023-10-24 LAB — BLOOD GAS, VENOUS
Acid-base deficit: 9 mmol/L — ABNORMAL HIGH (ref 0.0–2.0)
Bicarbonate: 18.8 mmol/L — ABNORMAL LOW (ref 20.0–28.0)
Drawn by: 7020
O2 Saturation: 26 %
Patient temperature: 36.6
pCO2, Ven: 46 mm[Hg] (ref 44–60)
pH, Ven: 7.22 — ABNORMAL LOW (ref 7.25–7.43)
pO2, Ven: <31 mm[Hg] — CL (ref 32–45)

## 2023-10-24 LAB — COMPREHENSIVE METABOLIC PANEL
ALT: 28 U/L (ref 0–44)
AST: 50 U/L — ABNORMAL HIGH (ref 15–41)
Albumin: 4.2 g/dL (ref 3.5–5.0)
Alkaline Phosphatase: 98 U/L (ref 38–126)
Anion gap: 22 — ABNORMAL HIGH (ref 5–15)
BUN: 57 mg/dL — ABNORMAL HIGH (ref 6–20)
CO2: 11 mmol/L — ABNORMAL LOW (ref 22–32)
Calcium: 9.9 mg/dL (ref 8.9–10.3)
Chloride: 100 mmol/L (ref 98–111)
Creatinine, Ser: 3.32 mg/dL — ABNORMAL HIGH (ref 0.61–1.24)
GFR, Estimated: 20 mL/min — ABNORMAL LOW (ref >60)
Glucose, Bld: 89 mg/dL (ref 70–99)
Potassium: 6.2 mmol/L — ABNORMAL HIGH (ref 3.5–5.1)
Sodium: 133 mmol/L — ABNORMAL LOW (ref 135–145)
Total Bilirubin: 2.7 mg/dL — ABNORMAL HIGH (ref <1.2)
Total Protein: 8.6 g/dL — ABNORMAL HIGH (ref 6.5–8.1)

## 2023-10-24 LAB — CBC WITH DIFFERENTIAL/PLATELET
Abs Immature Granulocytes: 0.03 10*3/uL (ref 0.00–0.07)
Basophils Absolute: 0 10*3/uL (ref 0.0–0.1)
Basophils Relative: 1 %
Eosinophils Absolute: 0 10*3/uL (ref 0.0–0.5)
Eosinophils Relative: 0 %
HCT: 44.2 % (ref 39.0–52.0)
Hemoglobin: 14.4 g/dL (ref 13.0–17.0)
Immature Granulocytes: 1 %
Lymphocytes Relative: 10 %
Lymphs Abs: 0.7 10*3/uL (ref 0.7–4.0)
MCH: 30.9 pg (ref 26.0–34.0)
MCHC: 32.6 g/dL (ref 30.0–36.0)
MCV: 94.8 fL (ref 80.0–100.0)
Monocytes Absolute: 0.6 10*3/uL (ref 0.1–1.0)
Monocytes Relative: 10 %
Neutro Abs: 5.2 10*3/uL (ref 1.7–7.7)
Neutrophils Relative %: 78 %
Platelets: 274 10*3/uL (ref 150–400)
RBC: 4.66 MIL/uL (ref 4.22–5.81)
RDW: 15.1 % (ref 11.5–15.5)
WBC: 6.6 10*3/uL (ref 4.0–10.5)
nRBC: 0 % (ref 0.0–0.2)

## 2023-10-24 LAB — GLUCOSE, CAPILLARY
Glucose-Capillary: 161 mg/dL — ABNORMAL HIGH (ref 70–99)
Glucose-Capillary: 64 mg/dL — ABNORMAL LOW (ref 70–99)
Glucose-Capillary: 139 mg/dL — ABNORMAL HIGH (ref 70–99)
Glucose-Capillary: 118 mg/dL — ABNORMAL HIGH (ref 70–99)
Glucose-Capillary: 116 mg/dL — ABNORMAL HIGH (ref 70–99)

## 2023-10-24 LAB — MAGNESIUM: Magnesium: 3 mg/dL — ABNORMAL HIGH (ref 1.7–2.4)

## 2023-10-24 LAB — BASIC METABOLIC PANEL
Anion gap: 16 — ABNORMAL HIGH (ref 5–15)
BUN: 58 mg/dL — ABNORMAL HIGH (ref 6–20)
CO2: 18 mmol/L — ABNORMAL LOW (ref 22–32)
Calcium: 9.2 mg/dL (ref 8.9–10.3)
Chloride: 98 mmol/L (ref 98–111)
Creatinine, Ser: 2.76 mg/dL — ABNORMAL HIGH (ref 0.61–1.24)
GFR, Estimated: 25 mL/min — ABNORMAL LOW (ref >60)
Glucose, Bld: 123 mg/dL — ABNORMAL HIGH (ref 70–99)
Potassium: 5.2 mmol/L — ABNORMAL HIGH (ref 3.5–5.1)
Sodium: 132 mmol/L — ABNORMAL LOW (ref 135–145)

## 2023-10-24 LAB — TROPONIN I (HIGH SENSITIVITY)
Troponin I (High Sensitivity): 45 ng/L — ABNORMAL HIGH (ref <18)
Troponin I (High Sensitivity): 70 ng/L — ABNORMAL HIGH (ref <18)

## 2023-10-24 MED ORDER — FAMOTIDINE 20 MG PO TABS
20.0000 mg | ORAL_TABLET | Freq: Two times a day (BID) | ORAL | Status: DC
  Administered 2023-10-24: 20 mg
  Filled 2023-10-24: qty 1

## 2023-10-24 MED ORDER — KETAMINE HCL 50 MG/5ML IJ SOSY
PREFILLED_SYRINGE | INTRAMUSCULAR | Status: AC
  Filled 2023-10-24: qty 10

## 2023-10-24 MED ORDER — SODIUM ZIRCONIUM CYCLOSILICATE 10 G PO PACK
10.0000 g | PACK | Freq: Once | ORAL | Status: AC
  Administered 2023-10-24: 10 g
  Filled 2023-10-24: qty 1

## 2023-10-24 MED ORDER — POLYETHYLENE GLYCOL 3350 17 G PO PACK
17.0000 g | PACK | Freq: Every day | ORAL | Status: DC
  Administered 2023-10-24: 17 g
  Filled 2023-10-24: qty 1

## 2023-10-24 MED ORDER — ACETAMINOPHEN 650 MG RE SUPP: 650.0000 mg | Freq: Four times a day (QID) | RECTAL | Status: DC | PRN

## 2023-10-24 MED ORDER — LORAZEPAM 2 MG/ML IJ SOLN: 0.5000 mg | Freq: Four times a day (QID) | INTRAMUSCULAR | Status: DC | PRN

## 2023-10-24 MED ORDER — MIDAZOLAM HCL 2 MG/2ML IJ SOLN: 1.0000 mg | INTRAMUSCULAR | Status: DC | PRN

## 2023-10-24 MED ORDER — GLYCOPYRROLATE 0.2 MG/ML IJ SOLN: 0.2000 mg | INTRAMUSCULAR | Status: DC | PRN

## 2023-10-24 MED ORDER — DEXTROSE 50 % IV SOLN
INTRAVENOUS | Status: AC
  Administered 2023-10-24: 50 mL
  Filled 2023-10-24: qty 50

## 2023-10-24 MED ORDER — NOREPINEPHRINE 4 MG/250ML-% IV SOLN
INTRAVENOUS | Status: AC
  Filled 2023-10-24: qty 250

## 2023-10-24 MED ORDER — FAMOTIDINE 20 MG PO TABS: 20.0000 mg | ORAL_TABLET | Freq: Every day | ORAL | Status: DC

## 2023-10-24 MED ORDER — NOREPINEPHRINE 4 MG/250ML-% IV SOLN
2.0000 ug/min | INTRAVENOUS | Status: DC
  Administered 2023-10-24: 2 ug/min via INTRAVENOUS
  Administered 2023-10-24 – 2023-10-25 (×2): 10 ug/min via INTRAVENOUS
  Administered 2023-10-26: 6 ug/min via INTRAVENOUS
  Administered 2023-10-26: 3 ug/min via INTRAVENOUS
  Filled 2023-10-24 (×6): qty 250

## 2023-10-24 MED ORDER — MIDODRINE HCL 5 MG PO TABS
10.0000 mg | ORAL_TABLET | Freq: Three times a day (TID) | ORAL | Status: DC
Start: 2023-10-24 — End: 2023-10-25
  Administered 2023-10-24 – 2023-10-25 (×4): 10 mg
  Filled 2023-10-24 (×3): qty 2

## 2023-10-24 MED ORDER — ACETAMINOPHEN 325 MG PO TABS
650.0000 mg | ORAL_TABLET | Freq: Four times a day (QID) | ORAL | Status: DC | PRN
  Administered 2023-10-24: 650 mg via ORAL
  Filled 2023-10-24: qty 2

## 2023-10-24 MED ORDER — ADULT MULTIVITAMIN LIQUID CH
15.0000 mL | Freq: Every day | ORAL | Status: DC
  Administered 2023-10-24: 15 mL
  Filled 2023-10-24: qty 15

## 2023-10-24 MED ORDER — GLYCOPYRROLATE 1 MG PO TABS: 1.0000 mg | ORAL_TABLET | ORAL | Status: DC | PRN

## 2023-10-24 MED ORDER — FENTANYL CITRATE PF 50 MCG/ML IJ SOSY
PREFILLED_SYRINGE | INTRAMUSCULAR | Status: AC
  Filled 2023-10-24: qty 2

## 2023-10-24 MED ORDER — FENTANYL 2500MCG IN NS 250ML (10MCG/ML) PREMIX INFUSION: 0.0000 ug/h | INTRAVENOUS | Status: DC

## 2023-10-24 MED ORDER — FENTANYL CITRATE PF 50 MCG/ML IJ SOSY: 25.0000 ug | PREFILLED_SYRINGE | INTRAMUSCULAR | Status: DC | PRN

## 2023-10-24 MED ORDER — ASPIRIN 81 MG PO CHEW
81.0000 mg | CHEWABLE_TABLET | Freq: Every day | ORAL | Status: DC
  Administered 2023-10-24 – 2023-10-25 (×2): 81 mg
  Filled 2023-10-24 (×2): qty 1

## 2023-10-24 MED ORDER — FENTANYL BOLUS VIA INFUSION: 50.0000 ug | INTRAVENOUS | Status: DC | PRN

## 2023-10-24 MED ORDER — MIRTAZAPINE 15 MG PO TBDP
7.5000 mg | ORAL_TABLET | Freq: Every day | ORAL | Status: DC
  Administered 2023-10-24: 7.5 mg
  Filled 2023-10-24: qty 0.5

## 2023-10-24 MED ORDER — DOCUSATE SODIUM 50 MG/5ML PO LIQD
100.0000 mg | Freq: Two times a day (BID) | ORAL | Status: DC
  Administered 2023-10-24: 100 mg
  Filled 2023-10-24: qty 10

## 2023-10-24 MED ORDER — AMIODARONE HCL 200 MG PO TABS
200.0000 mg | ORAL_TABLET | Freq: Every day | ORAL | Status: DC
  Administered 2023-10-24: 200 mg
  Filled 2023-10-24: qty 1

## 2023-10-24 MED ORDER — SODIUM BICARBONATE 8.4 % IV SOLN
100.0000 meq | Freq: Once | INTRAVENOUS | Status: AC
  Administered 2023-10-24: 100 meq via INTRAVENOUS
  Filled 2023-10-24: qty 100

## 2023-10-24 MED ORDER — ACETAMINOPHEN 160 MG/5ML PO SOLN
650.0000 mg | Freq: Four times a day (QID) | ORAL | Status: DC | PRN
  Filled 2023-10-24: qty 20.3

## 2023-10-24 MED ORDER — MIDAZOLAM HCL 2 MG/2ML IJ SOLN
INTRAMUSCULAR | Status: AC
  Filled 2023-10-24: qty 2

## 2023-10-24 MED ORDER — ETOMIDATE 2 MG/ML IV SOLN
INTRAVENOUS | Status: AC
  Administered 2023-10-24: 20 mg
  Filled 2023-10-24: qty 20

## 2023-10-24 MED ORDER — MELATONIN 5 MG PO TABS: 5.0000 mg | ORAL_TABLET | Freq: Every evening | ORAL | Status: DC | PRN

## 2023-10-24 MED ORDER — CLONAZEPAM 0.5 MG PO TABS
0.5000 mg | ORAL_TABLET | Freq: Two times a day (BID) | ORAL | Status: DC
Start: 2023-10-24 — End: 2023-10-25
  Administered 2023-10-24 – 2023-10-25 (×3): 0.5 mg
  Filled 2023-10-24 (×3): qty 1

## 2023-10-24 MED ORDER — FENTANYL CITRATE (PF) 100 MCG/2ML IJ SOLN
INTRAMUSCULAR | Status: AC
  Filled 2023-10-24: qty 2

## 2023-10-24 MED ORDER — ACETAMINOPHEN 325 MG PO TABS: 650.0000 mg | ORAL_TABLET | ORAL | Status: DC | PRN

## 2023-10-24 MED ORDER — ENOXAPARIN SODIUM 30 MG/0.3ML IJ SOSY
30.0000 mg | PREFILLED_SYRINGE | INTRAMUSCULAR | Status: DC
Start: 2023-10-25 — End: 2023-10-24

## 2023-10-24 MED ORDER — SUCCINYLCHOLINE CHLORIDE 200 MG/10ML IV SOSY
PREFILLED_SYRINGE | INTRAVENOUS | Status: AC
  Filled 2023-10-24: qty 10

## 2023-10-24 MED ORDER — ROCURONIUM BROMIDE 10 MG/ML (PF) SYRINGE
PREFILLED_SYRINGE | INTRAVENOUS | Status: AC
  Administered 2023-10-24: 100 mg
  Filled 2023-10-24: qty 10

## 2023-10-24 MED ORDER — GLYCOPYRROLATE 0.2 MG/ML IJ SOLN
0.2000 mg | INTRAMUSCULAR | Status: DC | PRN
Start: 2023-10-24 — End: 2023-10-24

## 2023-10-24 MED ORDER — FENTANYL BOLUS VIA INFUSION: 100.0000 ug | INTRAVENOUS | Status: DC | PRN

## 2023-10-24 MED ORDER — POLYVINYL ALCOHOL 1.4 % OP SOLN
1.0000 [drp] | Freq: Four times a day (QID) | OPHTHALMIC | Status: DC | PRN
Start: 2023-10-24 — End: 2023-10-25

## 2023-10-24 MED ORDER — FOLIC ACID 1 MG PO TABS
1.0000 mg | ORAL_TABLET | Freq: Every day | ORAL | Status: DC
  Administered 2023-10-24: 1 mg
  Filled 2023-10-24: qty 1

## 2023-10-24 MED ORDER — FENTANYL CITRATE PF 50 MCG/ML IJ SOSY
50.0000 ug | PREFILLED_SYRINGE | Freq: Once | INTRAMUSCULAR | Status: AC
Start: 2023-10-24 — End: 2023-10-24

## 2023-10-24 MED ORDER — FENTANYL 2500MCG IN NS 250ML (10MCG/ML) PREMIX INFUSION
0.0000 ug/h | INTRAVENOUS | Status: DC
  Administered 2023-10-24: 100 ug/h via INTRAVENOUS

## 2023-10-24 MED ORDER — SUCRALFATE 1 GM/10ML PO SUSP
1.0000 g | Freq: Two times a day (BID) | ORAL | Status: DC
  Administered 2023-10-24: 1 g
  Filled 2023-10-24: qty 10

## 2023-10-24 MED ORDER — ATORVASTATIN CALCIUM 40 MG PO TABS
40.0000 mg | ORAL_TABLET | Freq: Every day | ORAL | Status: DC
  Administered 2023-10-24: 40 mg
  Filled 2023-10-24: qty 1

## 2023-10-24 MED ORDER — DIGOXIN 125 MCG PO TABS: 0.1250 mg | ORAL_TABLET | Freq: Every day | ORAL | Status: DC

## 2023-10-24 MED ORDER — THIAMINE MONONITRATE 100 MG PO TABS
100.0000 mg | ORAL_TABLET | Freq: Every day | ORAL | Status: DC
Start: 2023-10-24 — End: 2023-10-24
  Administered 2023-10-24: 100 mg
  Filled 2023-10-24: qty 1

## 2023-10-24 MED ORDER — MIDAZOLAM HCL 2 MG/2ML IJ SOLN: 2.0000 mg | INTRAMUSCULAR | Status: DC | PRN

## 2023-10-24 MED ORDER — FENTANYL 2500MCG IN NS 250ML (10MCG/ML) PREMIX INFUSION
0.0000 ug/h | INTRAVENOUS | Status: DC
Start: 2023-10-24 — End: 2023-10-24
  Administered 2023-10-24: 25 ug/h via INTRAVENOUS
  Filled 2023-10-24: qty 250

## 2023-10-24 MED ORDER — SODIUM CHLORIDE 0.9 % IV SOLN: 250.0000 mL | INTRAVENOUS | Status: DC

## 2023-10-24 MED ORDER — CALCIUM GLUCONATE-NACL 1-0.675 GM/50ML-% IV SOLN
1.0000 g | Freq: Once | INTRAVENOUS | Status: AC
  Administered 2023-10-24: 1000 mg via INTRAVENOUS
  Filled 2023-10-24: qty 50

## 2023-10-24 MED FILL — Medication: Qty: 1 | Status: AC

## 2023-10-24 NOTE — Progress Notes (Signed)
eLink Physician-Brief Progress Note Patient Name: Dale [name] DOB: 31-Dec-1960 MRN: [id number]   Date of Service  10/24/2023  HPI/Events of Note  Patient admitted to the ICU with advanced cardiomyopathy and cardiac arrest requiring ACLS protocol, with successful ROSC, intubation and mechanical ventilation.  eICU Interventions  New Patient Evaluation.        Thomasene Lot Aishah Teffeteller 10/24/2023, 7:06 AM

## 2023-10-24 NOTE — Progress Notes (Signed)
   10/24/23 1326  Adult Ventilator Settings  Pressure Support (S)  12 cmH20 (Failed PSV 10/5, high RR, low VTs, increased to PSV 12/5.)  PEEP 5 cmH20  Adult Ventilator Measurements  Peak Airway Pressure 17 L/min  Mean Airway Pressure 10 cmH20  Resp Rate Spontaneous 35 br/min  Resp Rate Total 35 br/min  Exhaled Vt 527 mL  Measured Ve 17.2 L  Total PEEP 5 cmH20  SpO2 97 %  Adult Ventilator Alarms  Alarms On Y  Ve High Alarm 26 L/min  Ve Low Alarm 5 L/min  Resp Rate High Alarm 38 br/min  Resp Rate Low Alarm 8  PEEP Low Alarm 3 cmH2O  Press High Alarm 40 cmH2O  T Apnea 20 sec(s)  VAP Prevention  HOB> 30 Degrees Y

## 2023-10-24 NOTE — Progress Notes (Signed)
   10/24/23 0450  BiPAP/CPAP/SIPAP  $ Non-Invasive Ventilator  Non-Invasive Vent Set Up;Non-Invasive Vent Initial  $ Face Mask Large  Yes  BiPAP/CPAP/SIPAP Pt Type Adult  BiPAP/CPAP/SIPAP V60  Mask Type Full face mask  Mask Size Large  Set Rate 20 breaths/min  Respiratory Rate 36 breaths/min  IPAP 18 cmH20  EPAP 8 cmH2O  FiO2 (%) 40 %  Minute Ventilation 32  Leak 08  Peak Inspiratory Pressure (PIP) 18  Tidal Volume (Vt) 835  Patient Home Equipment No  Auto Titrate No  Press High Alarm 30 cmH2O  Press Low Alarm 5 cmH2O  Nasal massage performed Yes  CPAP/SIPAP surface wiped down Yes  Oxygen Percent 40 %   Pt. Placed on BiPAP V-60 per order, RN aware. Pt .tolerating fairly well.

## 2023-10-24 NOTE — Progress Notes (Signed)
This RN took over care for this PT At 0300. At 0315, this RN messaged provider for follow up on earlier Rapid response called on PT. I voiced my concern for periodic labored breathing. NP responded to bedside with new orders for CXR, VBG, and EKG (see chart). Critical Lab reported for PO2 <31. Provider notified. Order placed for Bipap which was placed by RT. Provider rounded on pt and made the decision to move pt to ICU/Stepdown. Report called to RN taking over care. On arrival to unit, RN witnessed the patient non-responsive and with agonal; breathing. No pulse present, CPR initiated and code called.

## 2023-10-24 NOTE — TOC Progression Note (Signed)
Transition of Care [facility]) - Progression Note    Patient Details  Name: Dale [name] MRN: [id number] Date of Birth: 01/08/1961  [facility]) CM/SW Contact  Lavenia Atlas, RN Phone Number: 10/24/2023, 5:39 PM  Clinical Narrative:   Per chart review patient transitioned to comfort care and currently intubated.  TOC following for discharge needs     Expected Discharge Plan: Home/Self Care (unknown at this time) Barriers to Discharge: Continued Medical Work up  Expected Discharge Plan and Services   Discharge Planning Services: CM Consult Post Acute Care Choice: NA Living arrangements for the past 2 months: Homeless Shelter Expected Discharge Date: 10/22/23               DME Arranged: N/A DME Agency: NA       HH Arranged: NA HH Agency: NA         Social Determinants of Health (SDOH) Interventions SDOH Screenings   Food Insecurity: Patient Unable To Answer (10/14/2023)  Recent Concern: Food Insecurity - Food Insecurity Present (07/21/2023)  Housing: Medium Risk (10/14/2023)  Transportation Needs: Unmet Transportation Needs (10/14/2023)  Utilities: Patient Unable To Answer (10/14/2023)  Alcohol Screen: High Risk (02/20/2022)  Depression (PHQ2-9): Medium Risk (05/05/2019)  Financial Resource Strain: High Risk (08/25/2023)  Tobacco Use: Low Risk  (10/14/2023)    Readmission Risk Interventions    10/20/2023    3:32 PM 10/19/2023    2:46 PM 02/18/2023   12:36 PM  Readmission Risk Prevention Plan  Transportation Screening Complete Complete Complete  PCP or Specialist Appt within 3-5 Days   Complete  HRI or Home Care Consult   Complete  Social Work Consult for Recovery Care Planning/Counseling   Complete  Palliative Care Screening   Not Applicable  Medication Review [employer]) Complete  Complete  PCP or Specialist appointment within 3-5 days of discharge Complete --   HRI or Home Care Consult Complete Not Complete   HRI or Home Care Consult  Pt Refusal Comments  Homeless   SW Recovery Care/Counseling Consult Complete Complete   Palliative Care Screening Complete    Skilled Nursing Facility Not Applicable

## 2023-10-24 NOTE — ED Provider Notes (Signed)
Responded to CODE BLUE in the ICU.  Upon my arrival, patient was noted to have a primary respiratory arrest with hypoxia and bradycardia.  Initial pulse check was V. tach.  He was shocked and given epinephrine.  Second pulse check was V-fib.  After 5 to 10 minutes of CPR, patient had ROSC.  During arrest, he did have some spontaneous breathing and movement.  Postarrest nothing meaningful.  Patient was intubated and care was handed off to the ICU team.   Physical Exam  BP 97/75   Pulse 78   Temp 97.7 F (36.5 C) (Oral)   Resp 19   Ht 1.803 m (5\' 11" )   Wt 102 kg   SpO2 100%   BMI 31.36 kg/m   Physical Exam Obtunded, critically ill CPR in place Tachypnea Procedures  Procedure Name: Intubation Date/Time: 10/24/2023 6:28 AM  Performed by: Shon Baton, MDPre-anesthesia Checklist: Patient identified, Patient being monitored, Emergency Drugs available, Timeout performed and Suction available Oxygen Delivery Method: Non-rebreather mask Preoxygenation: Pre-oxygenation with 100% oxygen Induction Type: Rapid sequence Ventilation: Mask ventilation without difficulty Laryngoscope Size: Glidescope and 4 Grade View: Grade II Tube size: 7.5 mm Number of attempts: 2 Placement Confirmation: ETT inserted through vocal cords under direct vision, CO2 detector and Breath sounds checked- equal and bilateral Secured at: 25 cm Tube secured with: ETT holder Dental Injury: Teeth and Oropharynx as per pre-operative assessment  Comments: Unable to pass 8.0 ET tube, patient did not have any adverse events and was successfully intubated with a 7.5 ET tube.    .Critical Care  Performed by: Shon Baton, MD Authorized by: Shon Baton, MD   Critical care provider statement:    Critical care time (minutes):  31   Critical care was necessary to treat or prevent imminent or life-threatening deterioration of the following conditions:  Cardiac failure and respiratory failure   Critical  care was time spent personally by me on the following activities:  Development of treatment plan with patient or surrogate, discussions with consultants, evaluation of patient's response to treatment, examination of patient, ordering and review of laboratory studies, ordering and review of radiographic studies, ordering and performing treatments and interventions, pulse oximetry, re-evaluation of patient's condition and review of old charts CPR  Date/Time: 10/24/2023 6:29 AM  Performed by: Shon Baton, MD Authorized by: Shon Baton, MD  CPR Procedure Details:    ACLS/BLS initiated by EMS: No     CPR/ACLS performed in the ED: No     Duration of CPR (minutes):  10   Outcome: ROSC obtained    CPR performed via ACLS guidelines under my direct supervision.  See RN documentation for details including defibrillator use, medications, doses and timing.   ED Course / MDM    Medical Decision Making Risk OTC drugs. Prescription drug management. Decision regarding hospitalization.          Shon Baton, MD 10/24/23 0630

## 2023-10-24 NOTE — Plan of Care (Addendum)
[facility] Patient: Dale [name] ZOX:[id number]   PCP: Pcp, No DOB: Jul 20, 1961   DOA: 10/13/2023   DOS: 10/24/2023    Patient had a cardiac arrest last night.  Required intubation.  Currently with the ICU service.  Highly appreciate ICU care. TRH will sign off for now.  Author: Lynden Oxford, MD [facility] 10/24/2023    If 7PM-7AM, please contact night-coverage at [email or web address]

## 2023-10-24 NOTE — Progress Notes (Signed)
   10/24/23 0815  Adult Ventilator Settings  Vent Mode PRVC  Vt Set (S)  600 mL (Weaned to 600 VT, 8cc/kg.)  Set Rate 18 bmp  FiO2 (%) 50 % (Weaned to 50%.)  I Time 0.91 Sec(s)  PEEP 8 cmH20  Adult Ventilator Measurements  SpO2 98 %

## 2023-10-24 NOTE — Progress Notes (Signed)
Patient Name: Dale [name] Date of Encounter: 10/24/2023 [facility] Cardiologist: Marca Ancona, MD   Interval Summary  .    VF arrest Intubated and sedated  Vital Signs .    Vitals:   10/24/23 0839 10/24/23 0900 10/24/23 0943 10/24/23 1000  BP:  99/64 102/68 106/75  Pulse: 81 78 75 77  Resp: (!) 24 (!) 24 18 (!) 25  Temp: (!) 97.3 F (36.3 C) (!) 97.3 F (36.3 C) (!) 97 F (36.1 C) (!) 97 F (36.1 C)  TempSrc:      SpO2: 96% 96% 96% 98%  Weight:      Height:        Intake/Output Summary (Last 24 hours) at 10/24/2023 1121 Last data filed at 10/24/2023 0330 Gross per 24 hour  Intake 100 ml  Output 275 ml  Net -175 ml      10/23/2023    5:00 AM 10/22/2023    5:00 AM 10/21/2023    5:00 AM  Last 3 Weights  Weight (lbs) 224 lb 13.9 oz 225 lb 1.4 oz 229 lb 11.5 oz  Weight (kg) 102 kg 102.1 kg 104.2 kg      Telemetry/ECG    Sinus rhythm.  PVCs.  Up to 10-second NSVT.- Personally Reviewed  [facility] 03/2022; Right Heart Pressures [facility] Procedural Findings: Hemodynamics (mmHg) RA mean 23 RV 50/21 PA 53/32, mean 40 PCWP mean 39 LV 117/35 AO 117/82  Oxygen saturations: PA 47% AO 97%  Cardiac Output (Fick) 3.5  Cardiac Index (Fick) 1.53 PVR < 1 WU    Physical Exam .    VS:  BP 106/75   Pulse 77   Temp (!) 97 F (36.1 C)   Resp (!) 25   Ht 5\' 11"  (1.803 m)   Wt 102 kg   SpO2 98%   BMI 31.36 kg/m  , BMI Body mass index is 31.36 kg/m. GENERAL: Critically ill-appearing HEENT: Eyes bulging NECK:  +JFD LUNGS:  +Crackles HEART:  RRR.  PMI not displaced or sustained,S1 and S2 within normal limits, no S3, no S4, no clicks, no rubs, no murmurs ABD:  Flat, positive bowel sounds normal in frequency in pitch, no bruits, no rebound, no guarding, no midline pulsatile mass, no hepatomegaly, no splenomegaly EXT:  2 plus pulses throughout, 1+ edema, no cyanosis no clubbing SKIN:  No rashes no nodules NEURO: Myoclonic jerking.  No purposeful  movement PSYCH:  Intubated and sedated.  Unable to assess.    Assessment & Plan .     # Acute on chronic systolic and diastolic heart failure: # Pulmonary HTN:  LVEF this admission less than 20%.  Severe LV dilatation.  Moderate RV dysfunction.  Patient has end-stage heart failure with low output and no other options for advanced therapies due to substance abuse and being unhoused. Patient has mixed ischemic/nonischemic cardiomyopathy.  History of alcohol and cocaine abuse.  He was transitioned from milrinone to digoxin and midodrine.  However, he failed to thrive and developed worsening cardiogenic shock followed by VF arrest.  He is now intubated and sedated.  He is having myoclonic jerking and no real purposeful movement.  Family was updated at the bedside about his grim prognosis and lack of options for advanced therapy.  They expressed understanding and are waiting for family to come and visit prior to withdrawing care.  Recommended adding benzodiazepines to try and help with the myoclonic jerking in addition to the IV fentanyl.  # CAD:  # Hyperlipidemia:  Left heart cath 05/2019 in 03/2022 revealed CTO of the RCA which was medically managed.  Otherwise nonobstructive CAD.    # NSVT:  He remains on IV amiodarone.  Did not a candidate for ICD in the setting of cocaine and alcohol abuse.  BP too low for beta-blockers.  # Alcohol use disorder: # Cocaine abuse:  Patient requests social work assistance.   Total critical care time: 50 minutes. Critical care time was exclusive of separately billable procedures and treating other patients. Critical care was necessary to treat or prevent imminent or life-threatening deterioration. Critical care was time spent personally by me on the following activities: development of treatment plan with patient and/or surrogate as well as nursing, discussions with consultants, evaluation of patient's response to treatment, examination of patient, obtaining history  from patient or surrogate, ordering and performing treatments and interventions, ordering and review of laboratory studies, ordering and review of radiographic studies, pulse oximetry and re-evaluation of patient's condition.    For questions or updates, please contact [facility] Please consult [email or web address] for contact info under        Signed, Chilton Si, MD

## 2023-10-24 NOTE — Progress Notes (Addendum)
Informed by bedside nurse at approximately 0030 that patient went into A.fib as alerted by telemetry monitor tech. Rhythm back to normal sinus by time of my arrival to patient's room at 0035. Patient was sitting up in chair and short of breath. Patient has been compliant this shift with nursing care. Assisted patient back to bed and advised bedside RN to administer scheduled dose of Ativan as prescribed earlier to help with dyspnea and anxiety. Heart rate did not get above 100, blood pressure unremarkable, and patient denies pain. Asked bedside RN to have phlebotomy come draw morning labs now to evaluate for electrolyte imbalances. Patient agrees to ativan administration and lab draw as well as wearing oxygen and telemetry monitor. Bedside RN will notify provider and rapid of any changes or needs.

## 2023-10-24 NOTE — Progress Notes (Signed)
Brief Nutrition Note  Patient screened for new ventilated patient. Per chart review, MD discussed with patient's daughter who would like for brother and mother to visit patient but then likely withdraw life support after.   Will sign off at this time due to patient now full DNR with plan for eventual inpatient comfort care.   Please consult if goals of care were to change/nutrition intervention warranted.   Shelle Iron RD, LDN Contact via [employer].

## 2023-10-24 NOTE — Consult Note (Signed)
NAME:  Dale Lafon, MRN:  [id number], DOB:  November 04, 1961, LOS: 10 ADMISSION DATE:  10/13/2023, CONSULTATION DATE:  10/24/2023  REFERRING MD:  Derrill Kay, CHIEF COMPLAINT: Cardiac arrest  History of Present Illness:  62 year old man with end-stage HFrEF, prior cocaine use admitted 12/9 with acute systolic heart failure.  LVEF less than 20% with severe LV dilatation and RV dysfunction.  He was treated with inotropes and diuresed.  Renal function worsened.  Palliative discussions ongoing.  There were some concerns about patient's capacity for decision-making and suicidal ideation, psychiatry was consulted.  There were multiple occasions where patient was refusing care. On 12/20, he was transferred back to the ICU from the floor due to respiratory distress and progressed to respiratory arrest with hypoxia, bradycardia V. tach and V-fib.  Required epi x 2, CPR with ROSC.  Intubated.  PCCM consulted  Pertinent  Medical History  End-stage HFrEF Nonobstructive CAD Cocaine and alcohol use AKI on CKD stage IIIb  Significant Hospital Events: Including procedures, antibiotic start and stop dates in addition to other pertinent events     Interim History / Subjective:  Critically ill, intubated Sedated on fentanyl drip Borderline hypotension, not on pressors Afebrile  Objective   Blood pressure 105/77, pulse 90, temperature 97.9 F (36.6 C), resp. rate 18, height 5\' 11"  (1.803 m), weight 102 kg, SpO2 98%.    Vent Mode: PRVC FiO2 (%):  [40 %-100 %] 50 % Set Rate:  [18 bmp] 18 bmp Vt Set:  [600 mL-700 mL] 600 mL PEEP:  [8 cmH20] 8 cmH20 Plateau Pressure:  [22 cmH20] 22 cmH20   Intake/Output Summary (Last 24 hours) at 10/24/2023 0830 Last data filed at 10/24/2023 0330 Gross per 24 hour  Intake 100 ml  Output 275 ml  Net -175 ml   Filed Weights   10/21/23 0500 10/22/23 0500 10/23/23 0500  Weight: 104.2 kg 102.1 kg 102 kg    Examination: General: Older man, intubated,  sedated HENT: 3 mm reactive to light, eyes open fixed gaze  Lungs: Bilateral ventilated breath sounds, no accessory muscle use Cardiovascular: S1-S2 regular, no murmur Abdomen: Soft, nontender, no hepatosplenomegaly Extremities: Right leg IO, 1+ edema Neuro: Unresponsive, does not follow commands  ABG shows metabolic acidosis. Labs show hyperkalemia, hyponatremia, BUN/creatinine 57/3.3, Chest x-ray shows ET tube in position, cardiomegaly, appearance consistent with congestive heart failure  Resolved Hospital Problem list     Assessment & Plan:  Cardiorespiratory arrest in this 62 year old man with end-stage cardiomyopathy and AKI with hyperkalemia and metabolic acidosis Palliative care conversations ongoing  VF cardiac arrest -in the setting of hyperkalemia, metabolic acidosis and end-stage cardiomyopathy -Treated with IV bicarb and calcium   Acute on chronic systolic heart failure-end-stage, pending discussion with family, holding off on further milrinone and midodrine -Diuretics and other GDMT on hold Poor prognosis has been conveyed to family  Acute respiratory failure with hypoxia -vent settings reviewed and adjusted Keep RR high to correct for acidosis Chest x-ray reviewed  AKI with hyperkalemia -Unfortunately related to cardiorenal -Will try to temporize with calcium, bicarb  Concern for suicidal ideation and lack of capacity during this hospital admission [facility] care -Seen by psychiatry, no need for sitter advised   Poor prognosis conveyed to daughter, awaiting her arrival for further goals of care discussion.  He is terminal and would not survive another cardiac arrest. Not a candidate for dialysis or  renal support  Best Practice (right click and "Reselect all SmartList Selections" daily)   Diet/type: NPO w/  meds via tube DVT prophylaxis LMWH Pressure ulcer(s): N/A GI prophylaxis: H2B Lines: N/A Foley:  Yes, and it is still needed Code Status:  full  code Last date of multidisciplinary goals of care discussion [ongoing discussions with daughter Andrea]  Labs   CBC: Recent Labs  Lab 10/18/23 0408 10/19/23 0408 10/21/23 0535 10/23/23 1011 10/24/23 0140  WBC 6.2 4.5 4.6 4.9 6.6  NEUTROABS  --   --   --  3.1 5.2  HGB 13.2 9.5* 12.1* 13.5 14.4  HCT 38.7* 28.3* 36.1* 41.0 44.2  MCV 90.4 92.2 91.2 91.9 94.8  PLT 247 182 227 293 274    Basic Metabolic Panel: Recent Labs  Lab 10/20/23 0600 10/20/23 1756 10/21/23 0535 10/21/23 1558 10/22/23 0425 10/22/23 1650 10/23/23 1011 10/23/23 1830 10/24/23 0140 10/24/23 0637  NA 129* 130* 132* 137 131* 132* 134* 131* 132* 133*  K 3.7 4.4 4.3 4.2 4.0 5.1 5.0 5.9* 5.2* 6.2*  CL 97* 95* 98 100 97* 102 101 100 98 100  CO2 24 25 25 26 24  21* 23 19* 18* 11*  GLUCOSE 93 219* 154* 126* 146* 119* 142* 145* 123* 89  BUN 39* 41* 41* 40* 39* 42* 49* 52* 58* 57*  CREATININE 1.82* 1.76* 1.81* 1.58* 1.82* 1.94* 2.19* 2.54* 2.76* 3.32*  CALCIUM 8.3* 8.4* 8.5* 8.6* 8.2* 8.1* 9.3 9.1 9.2 9.9  MG 2.8*  --  2.6*  --  2.3  --  2.8*  --  3.0*  --   PHOS 2.8 2.8  --  3.0  --  3.1  --  3.8  --   --    GFR: Estimated Creatinine Clearance: 28.8 mL/min (A) (by C-G formula based on SCr of 3.32 mg/dL (H)). Recent Labs  Lab 10/19/23 0408 10/21/23 0535 10/23/23 1011 10/24/23 0140  WBC 4.5 4.6 4.9 6.6    Liver Function Tests: Recent Labs  Lab 10/21/23 1558 10/22/23 1650 10/23/23 1011 10/23/23 1830 10/24/23 0637  AST  --   --  31  --  50*  ALT  --   --  27  --  28  ALKPHOS  --   --  104  --  98  BILITOT  --   --  1.1  --  2.7*  PROT  --   --  8.6*  --  8.6*  ALBUMIN 3.3* 3.8 4.1 4.2 4.2   No results for input(s): "LIPASE", "AMYLASE" in the last 168 hours. No results for input(s): "AMMONIA" in the last 168 hours.  ABG    Component Value Date/Time   PHART 7.03 (LL) 10/24/2023 0645   PCO2ART 47 10/24/2023 0645   PO2ART 103 10/24/2023 0645   HCO3 12.7 (L) 10/24/2023 0645   TCO2 25  03/18/2022 1214   TCO2 25 03/18/2022 1214   ACIDBASEDEF 18.2 (H) 10/24/2023 0645   O2SAT 98.1 10/24/2023 0645     Coagulation Profile: No results for input(s): "INR", "PROTIME" in the last 168 hours.  Cardiac Enzymes: No results for input(s): "CKTOTAL", "CKMB", "CKMBINDEX", "TROPONINI" in the last 168 hours.  HbA1C: Hgb A1c MFr Bld  Date/Time Value Ref Range Status  10/20/2023 06:00 AM 6.6 (H) 4.8 - 5.6 % Final    Comment:    (NOTE)         Prediabetes: 5.7 - 6.4         Diabetes: >6.4         Glycemic control for adults with diabetes: <7.0   02/20/2022 04:36 AM 5.3 4.8 - 5.6 %  Final    Comment:    (NOTE) Pre diabetes:          5.7%-6.4%  Diabetes:              >6.4%  Glycemic control for   <7.0% adults with diabetes     CBG: Recent Labs  Lab 10/23/23 1638 10/23/23 2026 10/24/23 0630 10/24/23 0644 10/24/23 0810  GLUCAP 136* 137* 64* 161* 139*    Review of Systems:   Unable to obtain  Past Medical History:  He,  has a past medical history of Alcohol abuse, CAD (coronary artery disease), Cardiorenal syndrome (10/22/2023), Chronic combined systolic and diastolic CHF (congestive heart failure) (HCC), CKD (chronic kidney disease), stage II, Cocaine abuse (HCC), Hyperlipidemia LDL goal <70, Hypertension, Ischemic cardiomyopathy, Morbid obesity (HCC), Noncompliance, and Normocytic anemia.   Surgical History:   Past Surgical History:  Procedure Laterality Date   RIGHT/LEFT HEART CATH AND CORONARY ANGIOGRAPHY N/A 05/10/2019   Procedure: RIGHT/LEFT HEART CATH AND CORONARY ANGIOGRAPHY;  Surgeon: Yvonne Kendall, MD;  Location: [facility] INVASIVE CV LAB;  Service: Cardiovascular;  Laterality: N/A;   RIGHT/LEFT HEART CATH AND CORONARY ANGIOGRAPHY N/A 03/18/2022   Procedure: RIGHT/LEFT HEART CATH AND CORONARY ANGIOGRAPHY;  Surgeon: Laurey Morale, MD;  Location: [facility] INVASIVE CV LAB;  Service: Cardiovascular;  Laterality: N/A;     Social History:   reports that he has never  smoked. He has never used smokeless tobacco. He reports current alcohol use. He reports that he does not currently use drugs after having used the following drugs: Marijuana and Cocaine. Frequency: 3.00 times per week.   Family History:  His family history includes Hypertension in his maternal grandmother.   Allergies Allergies  Allergen Reactions   Hydrocodone Itching     Home Medications  Prior to Admission medications   Medication Sig Start Date End Date Taking? Authorizing Provider  aspirin EC 81 MG tablet Take 1 tablet (81 mg total) by mouth daily. Swallow whole. 06/04/23  Yes Laurey Morale, MD  atorvastatin (LIPITOR) 40 MG tablet Take 1 tablet (40 mg total) by mouth daily. 06/04/23  Yes Laurey Morale, MD  dapagliflozin propanediol (FARXIGA) 10 MG TABS tablet Take 1 tablet (10 mg total) by mouth daily. 06/04/23  Yes Laurey Morale, MD  digoxin (LANOXIN) 0.125 MG tablet Take 0.5 tablets (0.0625 mg total) by mouth daily. 06/04/23  Yes Laurey Morale, MD  famotidine (PEPCID) 20 MG tablet Take 20 mg by mouth 2 (two) times daily. 08/13/23  Yes [provider]  metolazone (ZAROXOLYN) 2.5 MG tablet Take 1 tablet (2.5 mg total) by mouth daily for 2 days. With additional 40 meq of potassium 10/13/23 10/15/23 Yes Milford, [place], FNP  potassium chloride SA (KLOR-CON M) 20 MEQ tablet Take 3 tablets (60 mEq total) by mouth 2 (two) times daily. With additional with metolazone 10/13/23  Yes Milford, Carlos, FNP  potassium chloride SA (KLOR-CON M) 20 MEQ tablet Take 40 mEq by mouth 2 (two) times daily.   Yes [provider]  sildenafil (VIAGRA) 50 MG tablet Take 1 tablet (50 mg total) by mouth daily as needed for erectile dysfunction. 06/04/23  Yes Laurey Morale, MD  spironolactone (ALDACTONE) 25 MG tablet Take 0.5 tablets (12.5 mg total) by mouth daily. 06/04/23 05/29/24 Yes Laurey Morale, MD  torsemide (DEMADEX) 20 MG tablet Take 4 tablets (80 mg total) by mouth  daily. 10/13/23  Yes Milford, Anderson [place], FNP  torsemide (DEMADEX) 20 MG tablet  Take 20 mg by mouth daily.   Yes [provider]  amiodarone (PACERONE) 400 MG tablet Take 1 tablet (400 mg total) by mouth daily. 10/23/23   Osvaldo Shipper, MD  midodrine (PROAMATINE) 10 MG tablet Take 1 tablet (10 mg total) by mouth 3 (three) times daily with meals. 10/23/23   Osvaldo Shipper, MD  mirtazapine (REMERON SOL-TAB) 15 MG disintegrating tablet Take 0.5 tablets (7.5 mg total) by mouth at bedtime. 10/22/23   Osvaldo Shipper, MD  pantoprazole (PROTONIX) 40 MG tablet Take 1 tablet (40 mg total) by mouth daily. Patient not taking: Reported on 10/13/2023 02/19/23   Karoline Caldwell, MD  torsemide 40 MG TABS Take 80 mg by mouth 2 (two) times daily. 10/22/23   Osvaldo Shipper, MD     Critical care time: 76 m          Cyril Mourning MD. [facility]. [place] Pager : [phone]  If no response to pager , please call [phone] until 7 pm After 7:00 pm call Elink  [phone]   10/24/2023

## 2023-10-24 NOTE — Inpatient Diabetes Management (Signed)
Inpatient Diabetes Program Recommendations  AACE/ADA: New Consensus Statement on Inpatient Glycemic Control (2015)  Target Ranges:  Prepandial:   less than 140 mg/dL      Peak postprandial:   less than 180 mg/dL (1-2 hours)      Critically ill patients:  140 - 180 mg/dL    Latest Reference Range & Units 10/24/23 06:30 10/24/23 06:44 10/24/23 08:10  Glucose-Capillary 70 - 99 mg/dL 64 (L) 098 (H) 119 (H)  (L): Data is abnormally low (H): Data is abnormally high    Admit 10/13/2023 with Acute on chronic systolic CHF/ NICM/ Alcohol and cocaine abuse  Current Insulin Orders: Novolog Moderate Correction Scale/ SSI (0-15 units) TID AC + HS   Cardiac and resp arrest early this AM Now on Vent    MD- HYPO this AM  Please change Novolog SSI to the Sensitive scale 0-9 units Q4 hours    --Will follow patient during hospitalization--  Ambrose Finland RN, MSN, CDCES Diabetes Coordinator Inpatient Glycemic Control Team Team Pager: [phone] (8a-5p)

## 2023-10-24 NOTE — Progress Notes (Signed)
Received pt from 4th floor at 0555 and pt was in apparent distress. Once bed was in the room pt was noted to be in respiratory arrest, diaphoretic and unresponsive. Unable to find a pulse and code was called. Please see code sheet in pt paper chart for code progression.

## 2023-10-24 NOTE — Progress Notes (Signed)
   10/24/23 1325  Vent Select  Invasive or Noninvasive Invasive  Adult Vent Y  Airway 7.5 mm  Placement Date/Time: 10/24/23 1610   Inserted prior to hospital arrival?: (c) Other (Comment)  Placed By: ED Physician  Airway Device: Endotracheal Tube  Laryngoscope Blade: 3  ETT Types: Endobronchial;Oral;Subglottic  Size (mm): 7.5 mm  Cuffed: Cuffe...  Secured at (cm) 24 cm  Measured From Lips  Secured Location Center  Secured By English as a second language teacher No  Prone position No  Head position Right  Cuff Pressure (cm H2O) Green OR 18-26 CmH2O  Site Condition Dry  Adult Ventilator Settings  Vent Type Servo i  Humidity HME  Vent Mode (S)  PSV;CPAP  FiO2 (%) 40 %  Pressure Support (S)  5 cmH20  PEEP (S)  5 cmH20  Adult Ventilator Measurements  Resp Rate Spontaneous (S)  38 br/min  Exhaled Vt (S)  281 mL  SpO2 97 %

## 2023-10-24 NOTE — Progress Notes (Signed)
   10/24/23 1329  Adult Ventilator Settings  Vent Type Servo i  Humidity HME  Vent Mode (S)  PSV;CPAP  FiO2 (%) 40 %  Pressure Support (S)  15 cmH20 (Increased to PSV 15/5.)  PEEP 5 cmH20  Adult Ventilator Measurements  Peak Airway Pressure 20 L/min  Mean Airway Pressure 11 cmH20  Resp Rate Spontaneous 32 br/min  Resp Rate Total 32 br/min  Exhaled Vt 654 mL  Measured Ve 18.8 L  Total PEEP 5 cmH20  SpO2 97 %  Adult Ventilator Alarms  Alarms On Y  T Apnea 20 sec(s)  VAP Prevention  HOB> 30 Degrees Y

## 2023-10-24 NOTE — Progress Notes (Signed)
     Patient Name: Dale [name]           DOB: [birth year]  MRN: [id number]      Admission Date: 10/13/2023  Attending Provider: Oretha Milch, MD  Primary Diagnosis: Acute on chronic HFrEF (heart failure with reduced ejection fraction) (HCC)   Level of care: ICU   OVERNIGHT PROGRESS REPORT    Responded to CODE BLUE activated at 0555.    Nursing staff reporting respiratory arrest with hypoxia followed by bradycardia, PEA. ACLS followed, patient required 2 rounds of epi, 1 amp of bicarb, 1 g calcium gluconate, shock x1.   ROSC gained at 0605.  Patient intubated by ED physician. PCCM (Dr. Gaynell Face) notified of event.    Daughter [name]) has been called and updated, states that she will come to bedside as soon as she is able.    Anthoney Harada, DNP, ACNPC- [facility]

## 2023-10-24 NOTE — Progress Notes (Signed)
Discussed with mom, daughter & son - they were agreeable for withdrawal of vent & comfort care Orders placed but now it seems they would want to wait- will continue levophed & vent for now, use fentanyl as needed for comfort. Give family more time to come to terms with this decision.  Comer Locket Vassie Loll MD

## 2023-10-24 NOTE — IPAL (Signed)
  Interdisciplinary Goals of Care Family Meeting   Date carried out:: 10/24/2023  Location of the meeting: Conference room  Member's involved: Physician and Family Member or next of kin  Durable Power of Attorney or acting medical decision maker: daughter Dale [name]    Discussion: We discussed goals of care for Dale [name] .  We discussed that he has end-stage heart failure without any more therapeutic options.  We discussed that he is in renal failure and his organs are shutting down.  We discussed that cardiac arrest in the situation is very often a terminal event.  We discussed possibility of brain damage even though duration of arrest was short. Dale [name] would like for him to avoid any more suffering.  We are waiting on her brother and her mother to come visit.  They are likely to withdraw life support after.  We discussed not putting him through more CPR but maintaining current forms of life support until rest of the family has a chance to visit  Code status: Full DNR  Disposition: Continue current acute care , eventual inpatient comfort care   Time spent for the meeting: 25 minutes  Dale [name] 10/24/2023, 10:41 AM

## 2023-10-24 NOTE — Progress Notes (Signed)
RT Note: At ~0550 Pt. was transported uneventfully from 4 west while on V60 BiPAP and arrived [facility] ICU/SD shortly thereafter and upon this RT beginning to chart on BiPAP transport pt. became unresponsive and staff was unable to palpate central pulses, started to be bagged along with CPR initiated, CODE called, retrieved ventilator from stock room and assisted with Intubation. ED/MD placed 7.5 E.T. successfully 25cm at teeth which was later pulled back X2 cm per CXR to 23 at teeth/24 at lip, ABG was drawn on 700 cc Vt/R-18/PEEP +8/100% Fi02, sent to Lab which has since been resulted.

## 2023-10-24 NOTE — Progress Notes (Signed)
   10/24/23 0839  Adult Ventilator Settings  Vent Mode PRVC  Vt Set 600 mL  Set Rate (S)  24 bmp (per MD order.)  FiO2 (%) 50 %  PEEP (S)  5 cmH20 (Per MD order.)  Adult Ventilator Measurements  SpO2 96 %

## 2023-10-24 NOTE — Progress Notes (Signed)
   10/24/23 1426  Adult Ventilator Settings  Vent Mode (S)  PRVC (Pt is alert, increased RR, ? anxiety, discussed with RN, changed back to rest mode. Total wean: 1 hr.)

## 2023-10-24 NOTE — Progress Notes (Addendum)
    Patient Name: Dale [name]           DOB: 10-May-1961  MRN: [id number]      Admission Date: 10/13/2023  Attending Provider: Rolly Salter, MD  Primary Diagnosis: Acute on chronic HFrEF (heart failure with reduced ejection fraction) [facility])   Level of care: Telemetry    CROSS COVER NOTE   Date of Service   10/24/2023   Dale [name], 62 y.o. male, was admitted on 10/13/2023 for Acute on chronic HFrEF (heart failure with reduced ejection fraction) (HCC).    HPI/Events of Note   Patient is expressing anxiety.   RN reports episodes of anxiety are severe and seem to exacerbate his sensation of breathlessness.   Earlier tonight he received his nightly dose of clonazepam which allowed him to rest for only a couple minutes.  Order placed for low-dose IV Ativan.   Bedside Assessment:  While patient is asleep, noted to be tachypneic with mild accessory muscle use. He wakes up easily to verbal stimulation.  Currently A/O x 2.  While awake, breathing pattern drastically changes.  Noted to be tachypneic with increased work of breathing. Currently on 4L [place] th sats > 92%   He endorses worsening dyspnea. Denies CP or other discomfort. He communicates mainly by nodding and pointing at things, has minimal verbal communication given level of dyspnea.   Respiratory: Bilaterally clear, no wheezing, no crackles.  Tachypneic. Accessory muscle use.  Cardiovascular: Regular rate and rhythm.  BLE extremity edema. 2+ pedal pulses.   Addendum: VBG- pH 7.22, pCO2 46, P02 <31.  Metabolic acidosis likely related to worsening renal failure.    Interventions/ Plan   IV ativan, 0.5 mg x1 EKG Chest x-ray- Stable aeration and cardiomegaly. No interval finding.  VBG BiPAP- placed on bipap given inc WOB Transfer to SDU.  Daughter, Dale [name], has been notified of pt being transferred to SDU. All questions answered at this time. She confirms pt is a Full Code.  PCCM consult completed         Dale Harada, DNP, [facility]- [facility]

## 2023-10-24 NOTE — Progress Notes (Signed)
   10/24/23 1420  Vent Select  Invasive or Noninvasive Invasive  Adult Vent Y  Airway 7.5 mm  Placement Date/Time: 10/24/23 1610   Inserted prior to hospital arrival?: (c) Other (Comment)  Placed By: ED Physician  Airway Device: Endotracheal Tube  Laryngoscope Blade: 3  ETT Types: Endobronchial;Oral;Subglottic  Size (mm): 7.5 mm  Cuffed: Cuffe...  Secured at (cm) 24 cm  Measured From Lips  Secured Location Center  Secured By English as a second language teacher No  Prone position No  Head position Right  Cuff Pressure (cm H2O) Green OR 18-26 CmH2O  Site Condition Dry  Adult Ventilator Settings  Vent Type Servo i  Humidity HME  Vent Mode PSV;CPAP  FiO2 (%) (S)  35 % (Decrease to 35% FI02.)  Pressure Support (S)  10 cmH20 (Weaned to PSV 10.)  PEEP 5 cmH20  Adult Ventilator Measurements  Peak Airway Pressure 15 L/min  Mean Airway Pressure 10 cmH20  Resp Rate Spontaneous 18 br/min  Resp Rate Total 18 br/min  Exhaled Vt 554 mL  Measured Ve 9.4 L  Total PEEP 5 cmH20  SpO2 100 %  Adult Ventilator Alarms  Alarms On Y  Ve High Alarm 26 L/min  Ve Low Alarm 5 L/min  Resp Rate High Alarm 38 br/min  Resp Rate Low Alarm 8  PEEP Low Alarm 3 cmH2O  Press High Alarm 40 cmH2O  T Apnea 20 sec(s)  VAP Prevention  HOB> 30 Degrees Y  Breath Sounds  Bilateral Breath Sounds Diminished  Vent Respiratory Assessment  Level of Consciousness Alert

## 2023-10-25 DIAGNOSIS — I5023 Acute on chronic systolic (congestive) heart failure: Secondary | ICD-10-CM | POA: Diagnosis not present

## 2023-10-25 DIAGNOSIS — N179 Acute kidney failure, unspecified: Secondary | ICD-10-CM | POA: Diagnosis not present

## 2023-10-25 MED ORDER — POLYVINYL ALCOHOL 1.4 % OP SOLN
1.0000 [drp] | OPHTHALMIC | Status: DC | PRN
  Filled 2023-10-25: qty 15

## 2023-10-25 MED ORDER — MIRTAZAPINE 15 MG PO TBDP
7.5000 mg | ORAL_TABLET | Freq: Every day | ORAL | Status: DC
  Administered 2023-10-25 – 2023-10-29 (×5): 7.5 mg via ORAL
  Filled 2023-10-25 (×5): qty 0.5

## 2023-10-25 MED ORDER — ORAL CARE MOUTH RINSE: 15.0000 mL | OROMUCOSAL | Status: DC | PRN

## 2023-10-25 MED ORDER — TORSEMIDE 20 MG PO TABS
40.0000 mg | ORAL_TABLET | Freq: Every day | ORAL | Status: DC
  Administered 2023-10-25 – 2023-11-01 (×8): 40 mg via ORAL
  Filled 2023-10-25 (×9): qty 2

## 2023-10-25 MED ORDER — ACETAMINOPHEN 160 MG/5ML PO SOLN
650.0000 mg | Freq: Four times a day (QID) | ORAL | Status: DC | PRN
  Administered 2023-10-26 – 2023-10-27 (×2): 650 mg via ORAL
  Filled 2023-10-25 (×2): qty 20.3

## 2023-10-25 MED ORDER — AMIODARONE HCL 200 MG PO TABS
200.0000 mg | ORAL_TABLET | Freq: Every day | ORAL | Status: DC
  Administered 2023-10-25 – 2023-11-02 (×9): 200 mg via ORAL
  Filled 2023-10-25 (×9): qty 1

## 2023-10-25 MED ORDER — ASPIRIN 81 MG PO CHEW
81.0000 mg | CHEWABLE_TABLET | Freq: Every day | ORAL | Status: DC
  Administered 2023-10-26 – 2023-11-02 (×8): 81 mg via ORAL
  Filled 2023-10-25 (×8): qty 1

## 2023-10-25 MED ORDER — MELATONIN 5 MG PO TABS
5.0000 mg | ORAL_TABLET | Freq: Every evening | ORAL | Status: DC | PRN
  Administered 2023-10-31: 5 mg via ORAL
  Filled 2023-10-25: qty 1

## 2023-10-25 MED ORDER — MIDODRINE HCL 5 MG PO TABS
10.0000 mg | ORAL_TABLET | Freq: Three times a day (TID) | ORAL | Status: DC
  Administered 2023-10-25 – 2023-10-27 (×6): 10 mg via ORAL
  Filled 2023-10-25 (×6): qty 2

## 2023-10-25 MED ORDER — ACETAMINOPHEN 650 MG RE SUPP: 650.0000 mg | Freq: Four times a day (QID) | RECTAL | Status: DC | PRN

## 2023-10-25 MED ORDER — ORAL CARE MOUTH RINSE
15.0000 mL | OROMUCOSAL | Status: DC
  Administered 2023-10-25 (×8): 15 mL via OROMUCOSAL

## 2023-10-25 MED ORDER — CLONAZEPAM 0.5 MG PO TABS
0.5000 mg | ORAL_TABLET | Freq: Two times a day (BID) | ORAL | Status: DC
  Administered 2023-10-25 – 2023-11-02 (×16): 0.5 mg via ORAL
  Filled 2023-10-25 (×16): qty 1

## 2023-10-25 NOTE — Progress Notes (Signed)
   10/25/23 0748  Vent Select  Invasive or Noninvasive Invasive  Adult Vent Y  Airway 7.5 mm  Placement Date/Time: 10/24/23 1610   Inserted prior to hospital arrival?: (c) Other (Comment)  Placed By: ED Physician  Airway Device: Endotracheal Tube  Laryngoscope Blade: 3  ETT Types: Endobronchial;Oral;Subglottic  Size (mm): 7.5 mm  Cuffed: Cuffe...  Secured at (cm) 24 cm  Measured From Lips  Secured Location Center  Secured By English as a second language teacher No  Tube Holder Repositioned Yes  Prone position No  Head position Right  Cuff Pressure (cm H2O) Green OR 18-26 CmH2O  Site Condition Drainage (Comment)  Adult Ventilator Settings  Vent Type Servo i  Humidity HME  Vent Mode (S)  PSV;CPAP  FiO2 (%) (S)  35 % (Weaned to 28%.)  Pressure Support (S)  5 cmH20  PEEP (S)  5 cmH20  Adult Ventilator Measurements  Peak Airway Pressure 11 L/min  Mean Airway Pressure 8 cmH20  Resp Rate Spontaneous 24 br/min  Spont TV 1108 mL  Measured Ve 12.5 L  Total PEEP 5 cmH20  SpO2 100 %  Adult Ventilator Alarms  Alarms On Y  Ve Low Alarm 5 L/min  Resp Rate High Alarm 35 br/min  Resp Rate Low Alarm 8  PEEP Low Alarm 3 cmH2O  T Apnea 20 sec(s)  VAP Prevention  HOB> 30 Degrees Y  Breath Sounds  Bilateral Breath Sounds Diminished  Vent Respiratory Assessment  Level of Consciousness Alert  Suction Method  Respiratory Interventions Oral suction;Airway suction  Oral Suctioning/Secretions  Suction Type Oral  Suction Device Yankauer  Secretion Amount Small  Secretion Color White  Secretion Consistency Thin  Suction Tolerance Tolerated well  Suctioning Adverse Effects None  Airway Suctioning/Secretions  Suction Type ETT  Suction Device  Catheter  Secretion Amount Small  Secretion Color White  Secretion Consistency Thin  Suction Tolerance Tolerated well  Suctioning Adverse Effects None

## 2023-10-25 NOTE — Consult Note (Signed)
NAME:  Dale [name], MRN:  [id number], DOB:  06-06-1961, LOS: 11 ADMISSION DATE:  10/13/2023, CONSULTATION DATE:  10/25/2023  REFERRING MD:  Derrill Kay, CHIEF COMPLAINT: Cardiac arrest  History of Present Illness:  62 year old man with end-stage HFrEF, prior cocaine use admitted 12/9 with acute systolic heart failure.  LVEF less than 20% with severe LV dilatation and RV dysfunction.  He was treated with inotropes and diuresed.  Renal function worsened.  Palliative discussions ongoing.  There were some concerns about patient's capacity for decision-making and suicidal ideation, psychiatry was consulted.  There were multiple occasions where patient was refusing care. On 12/20, he was transferred back to the ICU from the floor due to respiratory distress and progressed to respiratory arrest with hypoxia, bradycardia V. tach and V-fib.  Required epi x 2, CPR with ROSC.  Intubated.  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 rate 20, height 5\' 11"  (1.803 m), weight 102 kg, SpO2 94%.    Vent Mode: PSV;CPAP FiO2 (%):  [35 %-50 %] 35 % Set Rate:  [24 bmp] 24 bmp Vt Set:  [600 mL] 600 mL PEEP:  [5 cmH20] 5 cmH20 Pressure Support:  [5 cmH20-15 cmH20] 5 cmH20 Plateau Pressure:  [18 cmH20-22 cmH20] 18 cmH20   Intake/Output Summary (Last 24 hours) at 10/25/2023 4098 Last data filed at 10/25/2023 0600 Gross per 24 hour  Intake 902.04 ml  Output 400 ml  Net 502.04 ml   Filed Weights   10/21/23 0500  10/22/23 0500 10/23/23 0500  Weight: 104.2 kg 102.1 kg 102 kg    Examination: General: Older man, intubated, sedated HENT: 3 mm reactive to light, no JVD Lungs: Bilateral ventilated breath sounds, no accessory muscle use Cardiovascular: S1-S2 regular, no murmur Abdomen: Soft, nontender, no hepatosplenomegaly Extremities: Right leg IO, 1+ edema Neuro: Awake, alert, interactive, able to write  12/20 ABG shows metabolic acidosis. Labs show hyperkalemia, hyponatremia, BUN/creatinine 57/3.3, Chest x-ray shows ET tube in position, cardiomegaly, appearance consistent with congestive heart failure  No labs 12/21  Resolved Hospital Problem list     Assessment & Plan:  Cardiorespiratory arrest in this 62 year old man with end-stage cardiomyopathy and AKI with hyperkalemia and metabolic acidosis .  He was made DNR, but now seems to be rallying.  He was weaned and extubated from the vent  VF cardiac arrest -in the setting of hyperkalemia, metabolic acidosis and end-stage cardiomyopathy -Resolved after treatment of hyperkalemia   Acute on chronic systolic heart failure-end-stage -no other options for advanced therapies due to substance abuse, psych issues and being unhoused  -  He was transitioned from milrinone to digoxin and midodrine.  -Diuretics and other GDMT on hold -Resume amiodarone and torsemide  Acute respiratory failure with hypoxia - weaned & extubated  AKI with hyperkalemia -Unfortunately related to cardiorenal -temporized with calcium, bicarb, resume toresemide  Concern for suicidal ideation and lack of capacity during this hospital admission  -Seen by psychiatry, no need for sitter advised - continue mirtazapine & klonopin  Plan was for comfort care  but he has rallied for now and able to be extubated.  Remains DNR and we will not escalate care.  Remains to be seen if he will come off Levophed, good chance that he will not. He has end-stage heart failure and would not  survive another cardiac arrest. Not a candidate for dialysis or  renal support. If he survives, we can discuss hospice eligibility  Best Practice (right click and "Reselect all SmartList Selections" daily)   Diet/type: Regular consistency (see orders) DVT prophylaxis LMWH Pressure ulcer(s): N/A GI prophylaxis: H2B Lines: N/A Foley:  Yes, and it is still needed Code Status:  full code Last date of multidisciplinary goals of care discussion [ongoing discussions with daughter Andrea]  Labs   CBC: Recent Labs  Lab 10/19/23 0408 10/21/23 0535 10/23/23 1011 10/24/23 0140  WBC 4.5 4.6 4.9 6.6  NEUTROABS  --   --  3.1 5.2  HGB 9.5* 12.1* 13.5 14.4  HCT 28.3* 36.1* 41.0 44.2  MCV 92.2 91.2 91.9 94.8  PLT 182 227 293 274    Basic Metabolic Panel: Recent Labs  Lab 10/20/23 0600 10/20/23 1756 10/21/23 0535 10/21/23 1558 10/22/23 0425 10/22/23 1650 10/23/23 1011 10/23/23 1830 10/24/23 0140 10/24/23 0637  NA 129* 130* 132* 137 131* 132* 134* 131* 132* 133*  K 3.7 4.4 4.3 4.2 4.0 5.1 5.0 5.9* 5.2* 6.2*  CL 97* 95* 98 100 97* 102 101 100 98 100  CO2 24 25 25 26 24  21* 23 19* 18* 11*  GLUCOSE 93 219* 154* 126* 146* 119* 142* 145* 123* 89  BUN 39* 41* 41* 40* 39* 42* 49* 52* 58* 57*  CREATININE 1.82* 1.76* 1.81* 1.58* 1.82* 1.94* 2.19* 2.54* 2.76* 3.32*  CALCIUM 8.3* 8.4* 8.5* 8.6* 8.2* 8.1* 9.3 9.1 9.2 9.9  MG 2.8*  --  2.6*  --  2.3  --  2.8*  --  3.0*  --   PHOS 2.8 2.8  --  3.0  --  3.1  --  3.8  --   --    GFR: Estimated Creatinine Clearance: 28.8 mL/min (A) (by C-G formula based on SCr of 3.32 mg/dL (H)). Recent Labs  Lab 10/19/23 0408 10/21/23 0535 10/23/23 1011 10/24/23 0140  WBC 4.5 4.6 4.9 6.6    Liver Function Tests: Recent Labs  Lab 10/21/23 1558 10/22/23 1650 10/23/23 1011 10/23/23 1830 10/24/23 0637  AST  --   --  31  --  50*  ALT  --   --  27  --  28  ALKPHOS  --   --  104  --  98  BILITOT  --   --  1.1  --  2.7*  PROT  --   --  8.6*  --   8.6*  ALBUMIN 3.3* 3.8 4.1 4.2 4.2   No results for input(s): "LIPASE", "AMYLASE" in the last 168 hours. No results for input(s): "AMMONIA" in the last 168 hours.  ABG    Component Value Date/Time   PHART 7.09 (LL) 10/24/2023 0928   PCO2ART 45 10/24/2023 0928   PO2ART 103 10/24/2023 0928   HCO3 13.9 (L) 10/24/2023 0928   TCO2 25 03/18/2022 1214   TCO2 25 03/18/2022 1214   ACIDBASEDEF 16.0 (H) 10/24/2023 0928   O2SAT 98.3 10/24/2023 0928     Coagulation Profile: No results for input(s): "INR", "PROTIME" in the last 168 hours.  Cardiac Enzymes: No results for input(s): "CKTOTAL", "CKMB", "CKMBINDEX", "TROPONINI" in the last 168 hours.  HbA1C: Hgb A1c MFr Bld  Date/Time Value Ref Range Status  10/20/2023 06:00 AM 6.6 (H) 4.8 - 5.6 % Final    Comment:    (NOTE)         Prediabetes: 5.7 - 6.4         Diabetes: >6.4         Glycemic control for adults with diabetes: <7.0   02/20/2022 04:36 AM 5.3 4.8 - 5.6 % Final    Comment:    (NOTE) Pre diabetes:          5.7%-6.4%  Diabetes:              >6.4%  Glycemic control for   <7.0% adults with diabetes     CBG: Recent Labs  Lab 10/24/23 0630 10/24/23 0644 10/24/23 0810 10/24/23 1237 10/24/23 2227  GLUCAP 64* 161* 139* 118* 116*   Critical care time: 35 m          Cyril Mourning MD. FCCP. [facility] Pager : [phone]  If no response to pager , please call [phone] until 7 pm After 7:00 pm call Elink  [phone]   10/25/2023

## 2023-10-25 NOTE — Procedures (Signed)
Extubation Procedure Note  Patient Details:   Name: Dale [name] DOB: July 03, 1961 MRN: [id number]   Airway Documentation:    Vent end date: (S) 10/25/23 (Pt extubated to 2 L [place], 96%.) Vent end time: 0815   Evaluation  O2 sats: stable throughout Complications: No apparent complications Patient did tolerate procedure well. Bilateral Breath Sounds: Diminished   Yes  Pt extubated per MD order, RT placed pt on 2 L [place], tolerating well. Nunzio Cobbs 10/25/2023, 8:19 AM

## 2023-10-26 DIAGNOSIS — N179 Acute kidney failure, unspecified: Secondary | ICD-10-CM | POA: Diagnosis not present

## 2023-10-26 DIAGNOSIS — I5023 Acute on chronic systolic (congestive) heart failure: Secondary | ICD-10-CM | POA: Diagnosis not present

## 2023-10-26 LAB — BLOOD GAS, ARTERIAL
Bicarbonate: 18.2 mmol/L — ABNORMAL LOW (ref 20.0–28.0)
Drawn by: 225631
Drawn by: 225631
FIO2: 100 %
MECHVT: 700 mL
O2 Saturation: 98.1 mmol/L — ABNORMAL HIGH (ref 0.0–2.0)
Patient temperature: 36.2
Patient temperature: 98.1 %
RATE: 18 {breaths}/min
pH, Arterial: 47 mmHg — CL (ref 7.35–7.45)
pH, Arterial: 8 cmH2O — CL (ref 7.35–7.45)
pO2, Arterial: 103 mm[Hg] (ref 83–48)
pO2, Arterial: 103 mmol/L — ABNORMAL LOW (ref 83–28.0)

## 2023-10-26 MED ORDER — PHENTOLAMINE MESYLATE 5 MG IJ SOLR
5.0000 mg | Freq: Once | INTRAMUSCULAR | Status: AC
Start: 2023-10-26 — End: 2023-10-26
  Administered 2023-10-26: 5 mg via SUBCUTANEOUS
  Filled 2023-10-26: qty 5

## 2023-10-26 NOTE — Progress Notes (Signed)
LEVO gtt stopped at 2005 d/t inifltration. Pt reports pain in right arm, swelling visible. Elink notified. Pharmacy called for antidote

## 2023-10-26 NOTE — Progress Notes (Signed)
[facility] PROGRESS NOTE  Patient: Dale [name] ZOX:[id number]   PCP: Pcp, No DOB: 1960/12/14   DOA: 10/13/2023   DOS: 10/26/2023    Patient was transferred to hospitalist service. But currently remains on Levophed. ICU continues to monitor and manage the patient. Looks like family's goal is to continue supportive care at least around Christmas. ICU managing care for now.  Author: Lynden Oxford, MD [facility] 10/26/2023 7:23 PM   If 7PM-7AM, please contact night-coverage at [email or web address]

## 2023-10-26 NOTE — Consult Note (Signed)
NAME:  Dale [name], MRN:  [id number], DOB:  05-29-1961, LOS: 12 ADMISSION DATE:  10/13/2023, CONSULTATION DATE:  10/26/2023  REFERRING MD:  Derrill Kay, CHIEF COMPLAINT: Cardiac arrest  History of Present Illness:  62 year old man with end-stage HFrEF, prior cocaine use admitted 12/9 with acute systolic heart failure.  LVEF less than 20% with severe LV dilatation and RV dysfunction.  He was treated with inotropes and diuresed.  Renal function worsened.  Palliative discussions ongoing.  There were some concerns about patient's capacity for decision-making and suicidal ideation, psychiatry was consulted.  There were multiple occasions where patient was refusing care. On 12/20, he was transferred back to the ICU from the floor due to respiratory distress and progressed to respiratory arrest with hypoxia, bradycardia V. tach and V-fib.  Required epi x 2, CPR with ROSC.  Intubated.  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 Remains on Levophed although lower doses  Objective   Blood pressure 103/70, pulse 88, temperature 98.4 F (36.9 C), temperature source Oral, resp. rate (!) 23, height 5\' 11"  (1.803 m), weight 102 kg, SpO2 97%.        Intake/Output Summary (Last 24 hours) at 10/26/2023 7846 Last data filed at 10/26/2023 0800 Gross per 24 hour  Intake 614.07 ml  Output 1775 ml  Net -1160.93 ml   Filed Weights   10/21/23 0500 10/22/23 0500 10/23/23 0500  Weight: 104.2 kg 102.1 kg  102 kg    Examination: General: Older man, sitting up in bed having breakfast, daughter at bedside HENT: 3 mm reactive to light, no JVD Lungs: Bilateral clear breath sounds, no accessory muscle use Cardiovascular: S1-S2 regular, no murmur Abdomen: Soft, nontender, no hepatosplenomegaly Extremities: 1+ edema Neuro: Awake, alert, interactive, voices improved  12/20 ABG shows metabolic acidosis. Labs show hyperkalemia, hyponatremia, BUN/creatinine 57/3.3, Chest x-ray shows ET tube in position, cardiomegaly, appearance consistent with congestive heart failure  No labs last 2 days  Resolved Hospital Problem list   VF cardiac arrest  12/20-in the setting of hyperkalemia, metabolic acidosis and end-stage cardiomyopathy   Acute respiratory failure with hypoxia -extubated 12/21  Assessment & Plan:  Cardiorespiratory arrest in this 62 year old man with end-stage cardiomyopathy and AKI with hyperkalemia and metabolic acidosis .  He was made DNR, but now seems to be rallying.  He was extubated and remains on low-dose Levophed.  Family now would like to continue supportive care  Acute on chronic systolic heart failure-end-stage -no other options for advanced therapies due to substance abuse, psych issues and being unhoused  -  He was transitioned from milrinone to digoxin and midodrine.  -Diuretics and other GDMT on hold -Resume amiodarone and torsemide -Continue low-dose Levophed, SBP 85 acceptable would not titrate up  AKI with hyperkalemia -Unfortunately related to cardiorenal - resumed toresemide  Concern for suicidal ideation and lack of capacity during this hospital admission  -Seen by psychiatry, no need for sitter advised - continue mirtazapine & klonopin  Plan was for comfort care but he has rallied for now ,  family would like to continue supportive care at least until Christmas.  Remains DNR and we will not escalate care.  Remains to be seen if he will come off Levophed, good  chance that he will not. He has end-stage heart failure and would not survive another cardiac arrest. Not a candidate for dialysis or  renal support. If he survives/comes off Levophed, we can discuss hospice eligibility Meanwhile DC Foley and mobilize  Best Practice (right click and "Reselect all SmartList Selections" daily)   Diet/type: Regular consistency (see orders) DVT prophylaxis LMWH Pressure ulcer(s): N/A GI prophylaxis: H2B Lines: N/A Foley:  Yes, and it is still needed Code Status:  full code Last date of multidisciplinary goals of care discussion [ongoing discussions with daughter Andrea]  Labs   CBC: Recent Labs  Lab 10/21/23 0535 10/23/23 1011 10/24/23 0140  WBC 4.6 4.9 6.6  NEUTROABS  --  3.1 5.2  HGB 12.1* 13.5 14.4  HCT 36.1* 41.0 44.2  MCV 91.2 91.9 94.8  PLT 227 293 274    Basic Metabolic Panel: Recent Labs  Lab 10/20/23 0600 10/20/23 1756 10/21/23 0535 10/21/23 1558 10/22/23 0425 10/22/23 1650 10/23/23 1011 10/23/23 1830 10/24/23 0140 10/24/23 0637  NA 129* 130* 132* 137 131* 132* 134* 131* 132* 133*  K 3.7 4.4 4.3 4.2 4.0 5.1 5.0 5.9* 5.2* 6.2*  CL 97* 95* 98 100 97* 102 101 100 98 100  CO2 24 25 25 26 24  21* 23 19* 18* 11*  GLUCOSE 93 219* 154* 126* 146* 119* 142* 145* 123* 89  BUN 39* 41* 41* 40* 39* 42* 49* 52* 58* 57*  CREATININE 1.82* 1.76* 1.81* 1.58* 1.82* 1.94* 2.19* 2.54* 2.76* 3.32*  CALCIUM 8.3* 8.4* 8.5* 8.6* 8.2* 8.1* 9.3 9.1 9.2 9.9  MG 2.8*  --  2.6*  --  2.3  --  2.8*  --  3.0*  --   PHOS 2.8 2.8  --  3.0  --  3.1  --  3.8  --   --    GFR: Estimated Creatinine Clearance: 28.8 mL/min (A) (by C-G formula based on SCr of 3.32 mg/dL (H)). Recent Labs  Lab 10/21/23 0535 10/23/23 1011 10/24/23 0140  WBC 4.6 4.9 6.6    Liver Function Tests: Recent Labs  Lab 10/21/23 1558 10/22/23 1650 10/23/23 1011 10/23/23 1830 10/24/23 0637  AST  --   --  31  --  50*  ALT  --   --  27  --  28  ALKPHOS  --   --  104  --  98   BILITOT  --   --  1.1  --  2.7*  PROT  --   --  8.6*  --  8.6*  ALBUMIN 3.3* 3.8 4.1 4.2 4.2   No results for input(s): "LIPASE", "AMYLASE" in the last 168 hours. No results for input(s): "AMMONIA" in the last 168 hours.  ABG    Component Value Date/Time   PHART 7.09 (LL) 10/24/2023 0928   PCO2ART 45 10/24/2023 0928   PO2ART 103 10/24/2023 0928   HCO3 13.9 (L) 10/24/2023 0928   TCO2 25 03/18/2022 1214   TCO2 25 03/18/2022 1214   ACIDBASEDEF 16.0 (H) 10/24/2023 0928   O2SAT 98.3 10/24/2023 0928     Coagulation Profile: No results for input(s): "INR", "PROTIME" in the last 168 hours.  Cardiac Enzymes: No results for input(s): "CKTOTAL", "CKMB", "CKMBINDEX", "TROPONINI" in the last 168 hours.  HbA1C: Hgb A1c MFr Bld  Date/Time Value Ref Range Status  10/20/2023 06:00 AM 6.6 (H) 4.8 - 5.6 % Final    Comment:    (NOTE)         Prediabetes: 5.7 - 6.4         Diabetes: >6.4         Glycemic control for adults with diabetes: <7.0   02/20/2022 04:36 AM 5.3 4.8 - 5.6 % Final    Comment:    (NOTE) Pre diabetes:          5.7%-6.4%  Diabetes:              >6.4%  Glycemic control for   <7.0% adults with diabetes     CBG: Recent Labs  Lab 10/24/23 0630 10/24/23 0644 10/24/23 0810 10/24/23 1237 10/24/23 2227  GLUCAP 64* 161* 139* 118* 116*   Critical care time: 31 m          Cyril Mourning MD. FCCP. [facility] Pager : [phone]  If no response to pager , please call [phone] until 7 pm After 7:00 pm call Elink  [phone]   10/26/2023

## 2023-10-27 LAB — CBC WITH DIFFERENTIAL/PLATELET
Abs Immature Granulocytes: 0.06 10*3/uL (ref 0.00–0.07)
Basophils Absolute: 0 10*3/uL (ref 0.0–0.1)
Basophils Relative: 0 %
Eosinophils Absolute: 0.2 10*3/uL (ref 0.0–0.5)
Eosinophils Relative: 2 %
HCT: 36 % — ABNORMAL LOW (ref 39.0–52.0)
Hemoglobin: 12 g/dL — ABNORMAL LOW (ref 13.0–17.0)
Immature Granulocytes: 1 %
Lymphocytes Relative: 11 %
Lymphs Abs: 0.9 10*3/uL (ref 0.7–4.0)
MCH: 30.3 pg (ref 26.0–34.0)
MCHC: 33.3 g/dL (ref 30.0–36.0)
MCV: 90.9 fL (ref 80.0–100.0)
Monocytes Absolute: 1.3 10*3/uL — ABNORMAL HIGH (ref 0.1–1.0)
Monocytes Relative: 15 %
Neutro Abs: 6.3 10*3/uL (ref 1.7–7.7)
Neutrophils Relative %: 71 %
Platelets: 217 10*3/uL (ref 150–400)
RBC: 3.96 MIL/uL — ABNORMAL LOW (ref 4.22–5.81)
RDW: 14.6 % (ref 11.5–15.5)
WBC: 8.8 10*3/uL (ref 4.0–10.5)
nRBC: 0 % (ref 0.0–0.2)

## 2023-10-27 LAB — BASIC METABOLIC PANEL
Anion gap: 10 (ref 5–15)
BUN: 57 mg/dL — ABNORMAL HIGH (ref 6–20)
CO2: 26 mmol/L (ref 22–32)
Calcium: 8.3 mg/dL — ABNORMAL LOW (ref 8.9–10.3)
Chloride: 100 mmol/L (ref 98–111)
Creatinine, Ser: 2.34 mg/dL — ABNORMAL HIGH (ref 0.61–1.24)
GFR, Estimated: 31 mL/min — ABNORMAL LOW (ref >60)
Glucose, Bld: 118 mg/dL — ABNORMAL HIGH (ref 70–99)
Potassium: 3.7 mmol/L (ref 3.5–5.1)
Sodium: 136 mmol/L (ref 135–145)

## 2023-10-27 LAB — CORTISOL: Cortisol, Plasma: 19.4 ug/dL

## 2023-10-27 MED ORDER — MIDODRINE HCL 5 MG PO TABS
15.0000 mg | ORAL_TABLET | Freq: Three times a day (TID) | ORAL | Status: DC
  Administered 2023-10-27 – 2023-11-02 (×17): 15 mg via ORAL
  Filled 2023-10-27 (×18): qty 3

## 2023-10-27 MED ORDER — OXYCODONE HCL 5 MG PO TABS
5.0000 mg | ORAL_TABLET | ORAL | Status: DC | PRN
  Administered 2023-10-27 – 2023-10-31 (×4): 5 mg via ORAL
  Filled 2023-10-27 (×4): qty 1

## 2023-10-27 NOTE — Plan of Care (Signed)
  Problem: Clinical Measurements: Goal: Ability to maintain clinical measurements within normal limits will improve Outcome: Not Progressing   Problem: Clinical Measurements: Goal: Respiratory complications will improve Outcome: Not Progressing   Problem: Fluid Volume: Goal: Ability to maintain a balanced intake and output will improve Outcome: Not Progressing

## 2023-10-27 NOTE — Progress Notes (Signed)
Chaplain attempts visit. Pt is sleeping and chaplain does not disturb him.

## 2023-10-27 NOTE — Progress Notes (Signed)
NAME:  Dale [name], MRN:  [id number], DOB:  04/03/1961, LOS: 13 ADMISSION DATE:  10/13/2023, CONSULTATION DATE:  10/27/2023  REFERRING MD:  Derrill Kay, CHIEF COMPLAINT: Cardiac arrest  History of Present Illness:  62 year old man with end-stage HFrEF, prior cocaine use admitted 12/9 with acute systolic heart failure.  LVEF less than 20% with severe LV dilatation and RV dysfunction.  He was treated with inotropes and diuresed.  Renal function worsened.  Palliative discussions ongoing.  There were some concerns about patient's capacity for decision-making and suicidal ideation, psychiatry was consulted.  There were multiple occasions where patient was refusing care. On 12/20, he was transferred back to the ICU from the floor due to respiratory distress and progressed to respiratory arrest with hypoxia, bradycardia V. tach and V-fib.  Required epi x 2, CPR with ROSC.  Intubated.  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 Off oxygen. Comfortable family wanting to support thru christmas   Interim History / Subjective:   Feels better. Still on low dose Norepi.  Says feels good as long as he doesn't have too many fluids  Objective   Blood pressure 94/66, pulse 82, temperature (!) 97.5 F (36.4 C), temperature source Oral, resp. rate (!) 31, height 5\' 11"  (1.803 m), weight 102 kg, SpO2 94%.        Intake/Output Summary (Last 24 hours) at 10/27/2023 0854 Last data filed at 10/27/2023 0813 Gross per 24 hour  Intake 642.4 ml  Output 2700 ml  Net -2057.6 ml   Filed Weights   10/21/23 0500 10/22/23 0500 10/23/23 0500   Weight: 104.2 kg 102.1 kg 102 kg    Examination: General 62 year old male. Sitting up in bed no distress HENT NCAT no JVD Pulm clear now on room air Card rrr Abd soft Ext trace LE edema warm  Neuro intact   Resolved Hospital Problem list   VF cardiac arrest  12/20-in the setting of hyperkalemia, metabolic acidosis and end-stage cardiomyopathy  Acute respiratory failure with hypoxia -extubated 12/21 AKI Hyperkalemia  Metabolic acidosis  Assessment & Plan:   Acute on chronic systolic heart failure-end-stage -no other options for advanced therapies due to substance abuse, psych issues and being unhoused. He is stable on norepi but we have not been able to wean him off Plan Cont midodrine (try 15mg  tid) Cont daily torsemide  Given on-going hypotension not candidate for GDMT Cont amiodarone Continue low-dose Levophed, SBP 85 acceptable would not titrate up Checking cortisol Family wishes to cont norepi thru christmas. We will cont to wean off slowly but NOT increase.  Will see how he does once we get to a point where he is off the norepi as he will almost certainly be hospice appropriate   AKI 2/2 cardiorenal syndrome improving Plan Cont current rx as above  Concern for suicidal ideation and lack of capacity during this hospital admission -Seen by psychiatry, no need for sitter advised Plan continue mirtazapine & klonopin    Best Practice (right click and "Reselect all SmartList Selections" daily)   Diet/type: Regular consistency (see orders) DVT prophylaxis LMWH Pressure ulcer(s): N/A GI prophylaxis: H2B Lines: N/A Foley:  Yes, and it is still needed Code Status:  full code Last date of multidisciplinary goals of care discussion [ongoing discussions with daughter Dale [name]]  My time 32 min

## 2023-10-27 NOTE — Progress Notes (Signed)
Per Dr. Vassie Loll, titrate down Levo. Titrate Levo down to 1 mcg then shut off if sys BP 85

## 2023-10-28 DIAGNOSIS — I5023 Acute on chronic systolic (congestive) heart failure: Secondary | ICD-10-CM | POA: Diagnosis not present

## 2023-10-28 DIAGNOSIS — J9601 Acute respiratory failure with hypoxia: Secondary | ICD-10-CM | POA: Diagnosis not present

## 2023-10-28 DIAGNOSIS — I5020 Unspecified systolic (congestive) heart failure: Secondary | ICD-10-CM | POA: Diagnosis not present

## 2023-10-28 DIAGNOSIS — N183 Chronic kidney disease, stage 3 unspecified: Secondary | ICD-10-CM

## 2023-10-28 DIAGNOSIS — N179 Acute kidney failure, unspecified: Secondary | ICD-10-CM | POA: Diagnosis not present

## 2023-10-28 NOTE — Progress Notes (Addendum)
[facility]                                                                              Deluca Cloos, is a 62 y.o. male, DOB - [birth year], [id number]:[id number] Admit date - 10/13/2023    Outpatient Primary MD for the patient is Pcp, No  LOS - 14  days  Chief Complaint  Patient presents with   Shortness of Breath   Leg Swelling   Suicidal       Brief summary   Patient is a 62 year old male with end-stage HFrEF, prior cocaine use admitted 12/9 with acute systolic heart failure.  LVEF less than 20% with severe LV dilatation and RV dysfunction.  He was treated with inotropes and diuresed.  Renal function worsened.  Palliative discussions ongoing.  There were some concerns about patient's capacity for decision-making and suicidal ideation, psychiatry was consulted.  There were multiple occasions where patient was refusing care. On 12/20, he was transferred back to the ICU from the floor due to respiratory distress and progressed to respiratory arrest with hypoxia, bradycardia V. tach and V-fib.  Required epi x 2, CPR with ROSC.  Intubated.  PCCM consulted Significant Hospital Events:   12/20 V-fib cardiac arrest , made DNR , family initially opted for comfort care but then reversed since he was improving 12/21 extubated 12/23 still on pressors. Off oxygen. Comfortable family wanting to support thru christmas  Pressors off.  TRH assumed care on 12/24  Assessment & Plan    Principal Problem:   Acute on chronic HFrEF (heart failure with reduced ejection fraction) (HCC) Active Problems:   Acute kidney injury superimposed on chronic kidney disease (HCC)   Acute respiratory failure with hypoxia (HCC)   Alcohol use disorder, moderate, in early remission (HCC)   CKD (chronic kidney disease), stage III (HCC)   Acute on chronic systolic CHF (congestive heart failure) (HCC)   Hx of cocaine abuse (HCC)   Atherosclerosis of native coronary artery of native heart without angina  pectoris   Cardiogenic shock (HCC)   Cardiorenal syndrome   Cardiac arrest (HCC)  V-fib cardiac arrest in the setting of end-stage cardiomyopathy/CHF, hyperkalemia metabolic acidosis -Resolved after treatment of hyperkalemia. -Continue amiodarone 200 mg daily  Acute on chronic systolic heart failure-end-stage -Initially placed on advanced heart failure therapies, transition from milrinone to digoxin and midodrine -Per CCM, no other options for advanced therapies due to substance abuse, psych issues and being unhoused.  He was placed on norepinephrine for BP support, weaned off on 10/27/2023 h -Continue midodrine 15 mg 3 times daily, torsemide 40 mg daily, amiodarone 200 mg daily -Given on-going hypotension not candidate for GDMT -Goals of care has been discussed by CCM and hospice appropriate, no escalation of care.  If further deterioration, transition to comfort care.  Acute kidney injury, cardiorenal syndrome, hyperkalemia, superimposed on CKD stage IIIb -Baseline creatinine ~2.1 -Continue current management -Creatinine improving, 2.3 on 12/23    Concern for suicidal ideation and lack of capacity during this hospital admission -Seen by psychiatry, no need for sitter advised -continue mirtazapine & klonopin   Obesity Estimated body mass index is 31.36  kg/m as calculated from the following:   Height as of this encounter: 5\' 11"  (1.803 m).   Weight as of this encounter: 102 kg.  Code Status: DNR limited DVT Prophylaxis:     Level of Care: Level of care: ICU Family Communication: Updated patient Disposition Plan:      Remains inpatient appropriate:      Procedures:    Consultants:   PCCM Palliative medicine Cardiology  Antimicrobials:   Anti-infectives (From admission, onward)    None          Medications  amiodarone  200 mg Oral Daily   aspirin  81 mg Oral Daily   Chlorhexidine Gluconate Cloth  6 each Topical Q0600   clonazePAM  0.5 mg Oral BID    midodrine  15 mg Oral TID WC   mirtazapine  7.5 mg Oral QHS   torsemide  40 mg Oral Daily      Subjective:   Nassim Kulbacki was seen and examined today.  Sitting up in the bed, denies any specific complaints.  BP stable, off Levophed. Patient denies dizziness, chest pain, shortness of breath, abdominal pain, N/V/D/C. No acute events overnight.    Objective:   Vitals:   10/28/23 0600 10/28/23 0700 10/28/23 0800 10/28/23 1014  BP: (!) 84/59 90/64 100/67 105/79  Pulse:  77 89 82  Resp:  14 20 (!) 34  Temp:   (!) 97.3 F (36.3 C)   TempSrc:   Oral   SpO2:  95% 95% 93%  Weight:      Height:        Intake/Output Summary (Last 24 hours) at 10/28/2023 1103 Last data filed at 10/28/2023 1000 Gross per 24 hour  Intake 1345 ml  Output 775 ml  Net 570 ml     Wt Readings from Last 3 Encounters:  10/23/23 102 kg  10/10/23 107.5 kg  09/03/23 109.8 kg     Exam General: Alert and oriented x 3, NAD Cardiovascular: S1 S2 auscultated,  RRR Respiratory: CTAB, no wheezing Gastrointestinal: Soft, nontender, nondistended, + bowel sounds Ext: + pedal edema bilaterally Neuro: no acute neuro deficits Psych: Normal affect     Data Reviewed:  I have personally reviewed following labs    CBC Lab Results  Component Value Date   WBC 8.8 10/27/2023   RBC 3.96 (L) 10/27/2023   HGB 12.0 (L) 10/27/2023   HCT 36.0 (L) 10/27/2023   MCV 90.9 10/27/2023   MCH 30.3 10/27/2023   PLT 217 10/27/2023   MCHC 33.3 10/27/2023   RDW 14.6 10/27/2023   LYMPHSABS 0.9 10/27/2023   MONOABS 1.3 (H) 10/27/2023   EOSABS 0.2 10/27/2023   BASOSABS 0.0 10/27/2023     Last metabolic panel Lab Results  Component Value Date   NA 136 10/27/2023   K 3.7 10/27/2023   CL 100 10/27/2023   CO2 26 10/27/2023   BUN 57 (H) 10/27/2023   CREATININE 2.34 (H) 10/27/2023   GLUCOSE 118 (H) 10/27/2023   GFRNONAA 31 (L) 10/27/2023   GFRAA 58 (L) 11/16/2020   CALCIUM 8.3 (L) 10/27/2023   PHOS 3.8 10/23/2023    PROT 8.6 (H) 10/24/2023   ALBUMIN 4.2 10/24/2023   BILITOT 2.7 (H) 10/24/2023   ALKPHOS 98 10/24/2023   AST 50 (H) 10/24/2023   ALT 28 10/24/2023   ANIONGAP 10 10/27/2023    CBG (last 3)  No results for input(s): "GLUCAP" in the last 72 hours.    Coagulation Profile: No results for input(s): "INR", "  PROTIME" in the last 168 hours.   Radiology Studies: I have personally reviewed the imaging studies  No results found.     Thad Ranger M.D. [facility] 10/28/2023, 11:03 AM  Available via Epic secure chat 7am-7pm After 7 pm, please refer to night coverage provider listed on amion.

## 2023-10-28 NOTE — Progress Notes (Signed)
NAME:  Vernest [name], MRN:  [id number], DOB:  1960-11-17, LOS: 14 ADMISSION DATE:  10/13/2023, CONSULTATION DATE:  10/28/2023  REFERRING MD:  Derrill Kay, CHIEF COMPLAINT: Cardiac arrest  History of Present Illness:  62 year old man with end-stage HFrEF, prior cocaine use admitted 12/9 with acute systolic heart failure.  LVEF less than 20% with severe LV dilatation and RV dysfunction.  He was treated with inotropes and diuresed.  Renal function worsened.  Palliative discussions ongoing.  There were some concerns about patient's capacity for decision-making and suicidal ideation, psychiatry was consulted.  There were multiple occasions where patient was refusing care. On 12/20, he was transferred back to the ICU from the floor due to respiratory distress and progressed to respiratory arrest with hypoxia, bradycardia V. tach and V-fib.  Required epi x 2, CPR with ROSC.  Intubated.  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 Off oxygen. Comfortable family wanting to support thru christmas   Interim History / Subjective:   Feels better. Still on low dose Norepi.  Says feels good as long as he doesn't have too many fluids  Objective   Blood pressure (!) 84/59, pulse 80, temperature 97.8 F (36.6 C), temperature source Oral, resp. rate (!) 24, height 5\' 11"  (1.803 m), weight 102 kg, SpO2 96%.        Intake/Output Summary (Last 24 hours) at 10/28/2023 0741 Last data filed at 10/28/2023 0232 Gross per 24 hour  Intake 1825 ml  Output 925 ml  Net 900 ml   Filed Weights   10/21/23 0500 10/22/23 0500 10/23/23 0500   Weight: 104.2 kg 102.1 kg 102 kg    Examination: General this is a 62 year old male he is laying in bed. No distress feels well HENT NCAT no JVD Pulm clear now on room air Card rrr Abd soft Ext warm dry dependent edema Neuro awake alert follows commands moves all ext   Resolved Hospital Problem list   VF cardiac arrest  12/20-in the setting of hyperkalemia, metabolic acidosis and end-stage cardiomyopathy Acute respiratory failure with hypoxia -extubated 12/21 AKI Hyperkalemia  Metabolic acidosis  Assessment & Plan:   Acute on chronic systolic heart failure-end-stage -no other options for advanced therapies due to substance abuse, psych issues and being unhoused. He is stable on norepi but we have not been able to wean him off Plan Cont midodrine @ 15 mg tid Cont daily torsemide  Given on-going hypotension not candidate for GDMT Cont amiodarone We will NOT add back pressor per prior plan of care. He is hospice appropriate. Would watch him another 24-48 hrs as he decompensated quickly last time and should that happen would need to transition to comfort ie: morphine etc  AKI 2/2 cardiorenal syndrome improving Plan Cont current rx.   Concern for suicidal ideation and lack of capacity during this hospital admission -Seen by psychiatry, no need for sitter advised Plan continue mirtazapine & klonopin    Best Practice (right click and "Reselect all SmartList Selections" daily)   Diet/type: Regular consistency (see orders) DVT prophylaxis LMWH Pressure ulcer(s): N/A GI prophylaxis: H2B Lines: N/A Foley:  Yes, and it is still needed  Code Status:  full code Last date of multidisciplinary goals of care discussion [ongoing discussions with daughter Andrea]  Pccm signing off. Should he decline at this point symptom management and in-pt hospice vs home hospice would be recommendation.

## 2023-10-28 NOTE — Plan of Care (Signed)
  Problem: Elimination: Goal: Will not experience complications related to urinary retention Outcome: Progressing Note: Output low   Problem: Safety: Goal: Ability to remain free from injury will improve Outcome: Not Progressing Note: Not following safety precautions sometimes such as getting up on their own   Problem: Fluid Volume: Goal: Ability to maintain a balanced intake and output will improve Outcome: Not Progressing

## 2023-10-29 DIAGNOSIS — I5020 Unspecified systolic (congestive) heart failure: Secondary | ICD-10-CM | POA: Diagnosis not present

## 2023-10-29 DIAGNOSIS — I5023 Acute on chronic systolic (congestive) heart failure: Secondary | ICD-10-CM | POA: Diagnosis not present

## 2023-10-29 DIAGNOSIS — N183 Chronic kidney disease, stage 3 unspecified: Secondary | ICD-10-CM | POA: Diagnosis not present

## 2023-10-29 DIAGNOSIS — J9601 Acute respiratory failure with hypoxia: Secondary | ICD-10-CM | POA: Diagnosis not present

## 2023-10-29 LAB — BASIC METABOLIC PANEL
Anion gap: 9 (ref 5–15)
BUN: 41 mg/dL — ABNORMAL HIGH (ref 6–20)
CO2: 26 mmol/L (ref 22–32)
Calcium: 8.6 mg/dL — ABNORMAL LOW (ref 8.9–10.3)
Chloride: 102 mmol/L (ref 98–111)
Creatinine, Ser: 1.82 mg/dL — ABNORMAL HIGH (ref 0.61–1.24)
GFR, Estimated: 42 mL/min — ABNORMAL LOW (ref >60)
Glucose, Bld: 89 mg/dL (ref 70–99)
Potassium: 3.4 mmol/L — ABNORMAL LOW (ref 3.5–5.1)
Sodium: 137 mmol/L (ref 135–145)

## 2023-10-29 LAB — CBC
HCT: 36.9 % — ABNORMAL LOW (ref 39.0–52.0)
Hemoglobin: 11.8 g/dL — ABNORMAL LOW (ref 13.0–17.0)
MCH: 29.6 pg (ref 26.0–34.0)
MCHC: 32 g/dL (ref 30.0–36.0)
MCV: 92.7 fL (ref 80.0–100.0)
Platelets: 234 10*3/uL (ref 150–400)
RBC: 3.98 MIL/uL — ABNORMAL LOW (ref 4.22–5.81)
RDW: 14.7 % (ref 11.5–15.5)
WBC: 6.4 10*3/uL (ref 4.0–10.5)
nRBC: 0 % (ref 0.0–0.2)

## 2023-10-29 MED ORDER — POTASSIUM CHLORIDE CRYS ER 20 MEQ PO TBCR
20.0000 meq | EXTENDED_RELEASE_TABLET | Freq: Once | ORAL | Status: AC
  Administered 2023-10-29: 20 meq via ORAL
  Filled 2023-10-29: qty 1

## 2023-10-29 MED ORDER — ALUM & MAG HYDROXIDE-SIMETH 200-200-20 MG/5ML PO SUSP: 15.0000 mL | ORAL | Status: DC | PRN

## 2023-10-29 NOTE — Progress Notes (Signed)
2 knives and patient's wallet taken to be locked up with security. Contents of wallet inventoried by security at bedside with patient present. Key to lock box placed in patient's chart with yellow copy of inventory sheet

## 2023-10-29 NOTE — Plan of Care (Signed)

## 2023-10-29 NOTE — Progress Notes (Signed)
[facility]                                                                              Dale [name], is a 62 y.o. male, DOB - 1961/01/17, [id number] Admit date - 10/13/2023    Outpatient Primary MD for the patient is Pcp, No  LOS - 15  days  Chief Complaint  Patient presents with   Shortness of Breath   Leg Swelling   Suicidal       Brief summary   Patient is a 62 year old male with end-stage HFrEF, prior cocaine use admitted 12/9 with acute systolic heart failure.  LVEF less than 20% with severe LV dilatation and RV dysfunction.  He was treated with inotropes and diuresed.  Renal function worsened.  Palliative discussions ongoing.  There were some concerns about patient's capacity for decision-making and suicidal ideation, psychiatry was consulted.  There were multiple occasions where patient was refusing care. On 12/20, he was transferred back to the ICU from the floor due to respiratory distress and progressed to respiratory arrest with hypoxia, bradycardia V. tach and V-fib.  Required epi x 2, CPR with ROSC.  Intubated.  PCCM consulted Significant Hospital Events:   12/20 V-fib cardiac arrest , made DNR , family initially opted for comfort care but then reversed since he was improving 12/21 extubated 12/23 still on pressors. Off oxygen. Comfortable family wanting to support thru christmas  Pressors off.  TRH assumed care on 12/24  Assessment & Plan    Principal Problem:  V-fib cardiac arrest in the setting of end-stage cardiomyopathy/CHF, hyperkalemia metabolic acidosis -Resolved after treatment of hyperkalemia. -Continue amiodarone 200 mg daily  Acute on chronic systolic heart failure-end-stage -Initially placed on advanced heart failure therapies, transition from milrinone to digoxin and midodrine -Per CCM, no other options for advanced therapies due to substance abuse, psych issues and being unhoused.  He was placed on norepinephrine for BP  support, weaned off on 10/27/2023 h -Placed on midodrine 15 mg 3 times daily, torsemide 40 mg daily, amiodarone 200 mg daily -Given on-going hypotension not candidate for GDMT -Goals of care has been discussed by CCM and hospice appropriate, no escalation of care.  If further deterioration, transition to comfort care. 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   Weight as of this encounter: 102 kg.  Code Status: DNR limited DVT Prophylaxis:     Level of Care: Level of care: Telemetry Family Communication: Updated patient Disposition Plan:      Remains inpatient appropriate:   Transfer to telemetry floor today, hopefully DC home tomorrow if continues to improve   Procedures:  Consultants:   PCCM Palliative medicine Cardiology  Antimicrobials:   Anti-infectives (From admission, onward)    None          Medications  amiodarone  200 mg Oral Daily   aspirin  81 mg Oral Daily   Chlorhexidine Gluconate Cloth  6 each Topical Q0600   clonazePAM  0.5 mg Oral BID   midodrine  15 mg Oral TID WC   mirtazapine  7.5 mg Oral QHS   torsemide  40 mg Oral Daily      Subjective:   Dale [name] was seen and examined today.  Sitting up in the chair, denies any specific complaints.  Still has lower extremity edema, improving.  No chest pain, acute shortness of breath, off vasopressors.   Objective:   Vitals:   10/29/23  0900 10/29/23 1000 10/29/23 1100 10/29/23 1152  BP: 106/67     Pulse: 83 83    Resp: (!) 26 (!) 27 12   Temp:    97.7 F (36.5 C)  TempSrc:    Oral  SpO2: 95% 97%    Weight:      Height:        Intake/Output Summary (Last 24 hours) at 10/29/2023 1239 Last data filed at 10/28/2023 1817 Gross per 24 hour  Intake --  Output 200 ml  Net -200 ml     Wt Readings from Last 3 Encounters:  10/23/23 102 kg  10/10/23 107.5 kg  09/03/23 109.8 kg   Physical Exam General: Alert and oriented x 3, NAD Cardiovascular: S1 S2 clear, RRR.  Respiratory: CTAB, no wheezing Gastrointestinal: Soft, nontender, nondistended, NBS Ext: 1-2+ pedal edema bilaterally Neuro: no new deficits Psych: Normal affect       Data Reviewed:  I have personally reviewed following labs    CBC Lab Results  Component Value Date   WBC 6.4 10/29/2023   RBC 3.98 (L) 10/29/2023   HGB 11.8 (L) 10/29/2023   HCT 36.9 (L) 10/29/2023   MCV 92.7 10/29/2023   MCH 29.6 10/29/2023   PLT 234 10/29/2023   MCHC 32.0 10/29/2023   RDW 14.7 10/29/2023   LYMPHSABS 0.9 10/27/2023   MONOABS 1.3 (H) 10/27/2023   EOSABS 0.2 10/27/2023   BASOSABS 0.0 10/27/2023     Last metabolic panel Lab Results  Component Value Date   NA 137 10/29/2023   K 3.4 (L) 10/29/2023   CL 102 10/29/2023   CO2 26 10/29/2023   BUN 41 (H) 10/29/2023   CREATININE 1.82 (H) 10/29/2023   GLUCOSE 89 10/29/2023   GFRNONAA 42 (L) 10/29/2023   GFRAA 58 (L) 11/16/2020   CALCIUM 8.6 (L) 10/29/2023   PHOS 3.8 10/23/2023   PROT 8.6 (H) 10/24/2023   ALBUMIN 4.2 10/24/2023   BILITOT 2.7 (H) 10/24/2023   ALKPHOS 98 10/24/2023   AST 50 (H) 10/24/2023   ALT 28 10/24/2023   ANIONGAP 9 10/29/2023    CBG (last 3)  No results for input(s): "GLUCAP" in the last 72 hours.    Coagulation Profile: No results for input(s): "INR", "PROTIME" in the last 168 hours.   Radiology Studies: I have personally reviewed the imaging studies  No results  found.     Thad Ranger M.D. [facility] 10/29/2023, 12:39 PM  Available via Epic secure chat 7am-7pm After 7 pm, please refer to night coverage provider listed on amion.

## 2023-10-30 ENCOUNTER — Other Ambulatory Visit (HOSPITAL_COMMUNITY): Payer: Self-pay

## 2023-10-30 ENCOUNTER — Other Ambulatory Visit: Payer: Self-pay

## 2023-10-30 DIAGNOSIS — F332 Major depressive disorder, recurrent severe without psychotic features: Secondary | ICD-10-CM

## 2023-10-30 DIAGNOSIS — N179 Acute kidney failure, unspecified: Secondary | ICD-10-CM | POA: Diagnosis not present

## 2023-10-30 DIAGNOSIS — F4312 Post-traumatic stress disorder, chronic: Secondary | ICD-10-CM

## 2023-10-30 DIAGNOSIS — I5020 Unspecified systolic (congestive) heart failure: Secondary | ICD-10-CM | POA: Diagnosis not present

## 2023-10-30 DIAGNOSIS — I5023 Acute on chronic systolic (congestive) heart failure: Secondary | ICD-10-CM | POA: Diagnosis not present

## 2023-10-30 DIAGNOSIS — J9601 Acute respiratory failure with hypoxia: Secondary | ICD-10-CM | POA: Diagnosis not present

## 2023-10-30 LAB — BASIC METABOLIC PANEL
Anion gap: 8 (ref 5–15)
BUN: 40 mg/dL — ABNORMAL HIGH (ref 6–20)
CO2: 27 mmol/L (ref 22–32)
Calcium: 8.7 mg/dL — ABNORMAL LOW (ref 8.9–10.3)
Chloride: 103 mmol/L (ref 98–111)
Creatinine, Ser: 1.7 mg/dL — ABNORMAL HIGH (ref 0.61–1.24)
GFR, Estimated: 46 mL/min — ABNORMAL LOW (ref >60)
Glucose, Bld: 110 mg/dL — ABNORMAL HIGH (ref 70–99)
Potassium: 3.5 mmol/L (ref 3.5–5.1)
Sodium: 138 mmol/L (ref 135–145)

## 2023-10-30 MED ORDER — LORAZEPAM 1 MG PO TABS
1.0000 mg | ORAL_TABLET | Freq: Once | ORAL | Status: AC
  Administered 2023-10-30: 1 mg via ORAL
  Filled 2023-10-30: qty 1

## 2023-10-30 MED ORDER — MIRTAZAPINE 15 MG PO TBDP
15.0000 mg | ORAL_TABLET | Freq: Every day | ORAL | Status: DC
  Administered 2023-10-30 – 2023-11-01 (×3): 15 mg via ORAL
  Filled 2023-10-30 (×3): qty 1

## 2023-10-30 MED ORDER — PRAZOSIN HCL 1 MG PO CAPS
1.0000 mg | ORAL_CAPSULE | Freq: Every day | ORAL | Status: DC
Start: 2023-10-30 — End: 2023-10-30

## 2023-10-30 MED ORDER — POTASSIUM CHLORIDE CRYS ER 20 MEQ PO TBCR
20.0000 meq | EXTENDED_RELEASE_TABLET | Freq: Every day | ORAL | 4 refills | Status: DC
  Filled 2023-10-30: qty 30, 30d supply, fill #0

## 2023-10-30 MED ORDER — AMIODARONE HCL 200 MG PO TABS
200.0000 mg | ORAL_TABLET | Freq: Every day | ORAL | 3 refills | Status: DC
  Filled 2023-10-30: qty 30, 30d supply, fill #0

## 2023-10-30 MED ORDER — MIDODRINE HCL 5 MG PO TABS
15.0000 mg | ORAL_TABLET | Freq: Three times a day (TID) | ORAL | 3 refills | Status: DC
  Filled 2023-10-30: qty 168, 18d supply, fill #0
  Filled 2023-10-30: qty 102, 12d supply, fill #0

## 2023-10-30 MED ORDER — MIRTAZAPINE 15 MG PO TBDP
7.5000 mg | ORAL_TABLET | Freq: Every day | ORAL | 0 refills | Status: DC
  Filled 2023-10-30: qty 30, 60d supply, fill #0

## 2023-10-30 MED ORDER — TORSEMIDE 20 MG PO TABS
40.0000 mg | ORAL_TABLET | Freq: Every day | ORAL | 1 refills | Status: DC
  Filled 2023-10-30: qty 60, 30d supply, fill #0

## 2023-10-30 MED ORDER — CLONAZEPAM 0.5 MG PO TABS
0.5000 mg | ORAL_TABLET | Freq: Two times a day (BID) | ORAL | 0 refills | Status: DC
  Filled 2023-10-30: qty 60, 30d supply, fill #0

## 2023-10-30 NOTE — Plan of Care (Signed)
  Problem: Education: Goal: Knowledge of General Education information will improve Description: Including pain rating scale, medication(s)/side effects and non-pharmacologic comfort measures Outcome: Not Progressing   Problem: Health Behavior/Discharge Planning: Goal: Ability to manage health-related needs will improve Outcome: Not Progressing   Problem: Coping: Goal: Level of anxiety will decrease Outcome: Not Progressing   

## 2023-10-30 NOTE — TOC Progression Note (Addendum)
Transition of Care [facility]) - Progression Note    Patient Details  Name: Dale [name] MRN: [id number] Date of Birth: Nov 04, 1961  Transition of [facility]) CM/SW Contact  Amada Jupiter, LCSW Phone Number: 10/30/2023, 3:14 PM  Clinical Narrative:     (1624) Addendum:  Psychiatry plan update - now planning to see pt again tomorrow and may clear for dc with recommendation for IOP.  Will ask TOC following tomorrow to follow up with daughter to check status of hotel set up as well as need for referral to [facility] for OP palliative.  Will ALSO need home O2 order, sats note and referral placed.    Alerted this morning by MD that pt was medically ready for dc today and requesting dc planning assistance.  Met with pt today and he confirms that he was homeless at time of admission and has limited dc options.  He does not feel safe going to friend's home due to active drug abuse ongoing there.  Reports he does not want to go to [facility] in [facility] and that he is on the waitlist for transitional housing program through the [place] but this is a long wait time.  He speculates he might dc to his mother's home but cannot go to daughter's.  Left VM for mother, however, in the meantime able to speak with pt's daughter who confirms that pt's mother will not allow him to go to her home.  Daughter reports that she will assist pt with securing a hotel room and pt is agreeable with this and willing to pay for this.    After confirming this as dc plan, alerted by psychiatry services that they have NOT cleared pt for dc and are recommending IP psych.  Will begin work up for IP bed.    Expected Discharge Plan: Home/Self Care (unknown at this time) Barriers to Discharge: Continued Medical Work up  Expected Discharge Plan and Services   Discharge Planning Services: CM Consult Post Acute Care Choice: NA Living arrangements for the past 2 months: Homeless Shelter Expected Discharge Date: 10/30/23                DME Arranged: N/A DME Agency: NA       HH Arranged: NA HH Agency: NA         Social Determinants of Health (SDOH) Interventions SDOH Screenings   Food Insecurity: Patient Unable To Answer (10/14/2023)  Recent Concern: Food Insecurity - Food Insecurity Present (07/21/2023)  Housing: Medium Risk (10/14/2023)  Transportation Needs: Unmet Transportation Needs (10/14/2023)  Utilities: Patient Unable To Answer (10/14/2023)  Alcohol Screen: High Risk (02/20/2022)  Depression (PHQ2-9): Medium Risk (05/05/2019)  Financial Resource Strain: High Risk (08/25/2023)  Tobacco Use: Low Risk  (10/14/2023)    Readmission Risk Interventions    10/20/2023    3:32 PM 10/19/2023    2:46 PM 02/18/2023   12:36 PM  Readmission Risk Prevention Plan  Transportation Screening Complete Complete Complete  PCP or Specialist Appt within 3-5 Days   Complete  HRI or Home Care Consult   Complete  Social Work Consult for Recovery Care Planning/Counseling   Complete  Palliative Care Screening   Not Applicable  Medication Review [employer]) Complete  Complete  PCP or Specialist appointment within 3-5 days of discharge Complete --   HRI or Home Care Consult Complete Not Complete   HRI or Home Care Consult Pt Refusal Comments  Homeless   SW Recovery Care/Counseling Consult Complete Complete  Palliative Care Screening Complete    Skilled Nursing Facility Not Applicable

## 2023-10-30 NOTE — Progress Notes (Signed)
[facility]                                                                              Josuah Ibrahim, is a 62 y.o. male, DOB - December 27, 1960, ZOX:[id number] Admit date - 10/13/2023    Outpatient Primary MD for the patient is Pcp, No  LOS - 16  days  Chief Complaint  Patient presents with   Shortness of Breath   Leg Swelling   Suicidal       Brief summary   Patient is a 62 year old male with end-stage HFrEF, prior cocaine use admitted 12/9 with acute systolic heart failure.  LVEF less than 20% with severe LV dilatation and RV dysfunction.  He was treated with inotropes and diuresed.  Renal function worsened.  Palliative discussions ongoing.  There were some concerns about patient's capacity for decision-making and suicidal ideation, psychiatry was consulted.  There were multiple occasions where patient was refusing care. On 12/20, he was transferred back to the ICU from the floor due to respiratory distress and progressed to respiratory arrest with hypoxia, bradycardia V. tach and V-fib.  Required epi x 2, CPR with ROSC.  Intubated.  PCCM consulted Significant Hospital Events:   12/20 V-fib cardiac arrest , made DNR , family initially opted for comfort care but then reversed since he was improving 12/21 extubated 12/23 still on pressors. Off oxygen. Comfortable family wanting to support thru christmas  Pressors off.  [facility] assumed care on 12/24  Assessment & Plan    Principal Problem:  V-fib cardiac arrest in the setting of end-stage cardiomyopathy/CHF, hyperkalemia metabolic acidosis -Resolved after treatment of hyperkalemia. -Continue amiodarone 200 mg daily  Acute on chronic systolic heart failure-end-stage Acute respiratory failure with hypoxia -Initially placed on advanced heart failure therapies, transition from milrinone to digoxin and midodrine -Per CCM, no other options for advanced therapies due to substance abuse, psych issues and being unhoused.  He  was placed on norepinephrine for BP support, weaned off on 10/27/2023 h -Placed on midodrine 15 mg 3 times daily, torsemide 40 mg daily, amiodarone 200 mg daily -Given on-going hypotension not candidate for GDMT -Goals of care has been discussed by CCM and hospice appropriate, no escalation of care.  If further deterioration, transition to comfort care. -Net negative of 18.56 L since admission, weight 252 on admission-> 236.9 lbs today, on torsemide 40 mg daily.  Titrate up torsemide if tolerated with BP (was on 80 mg outpatient). -O2 sats 100% on 2 L.  Not on O2 prior to admission, will need home O2 evaluation.    Severe MDD, PTSD, suicidal ideation History of polysubstance abuse -Psychiatry was consulted again for reevaluation for SI.  Patient made suicidal ideation statements over the phone yesterday 12/25 with plan using the knives.  The cutlery knives were confiscated by security. -Patient was seen by psychiatry today, Dr Woodroe Mode, no psychiatry clearance today, increase  Remeron.  Currently on Klonopin as well.   Acute kidney injury, cardiorenal syndrome, hyperkalemia, superimposed on CKD stage IIIb -Baseline creatinine ~2.1 -Continue current management -Creatinine proving, 1.7   Hypokalemia -Potassium 3.4, replaced   Obesity Estimated body mass index is  33.05 kg/m as calculated from the following:   Height as of this encounter: 5\' 11"  (1.803 m).   Weight as of this encounter: 107.5 kg.  Code Status: DNR limited DVT Prophylaxis:  Place TED hose Start: 10/30/23 1237   Level of Care: Level of care: Telemetry Family Communication: Updated patient Disposition Plan:      Remains inpatient appropriate:   Transfer to telemetry floor today.  DC when cleared by psychiatry  Procedures:    Consultants:   PCCM Palliative medicine Cardiology  Antimicrobials:   Anti-infectives (From admission, onward)    None          Medications  amiodarone  200 mg Oral Daily    aspirin  81 mg Oral Daily   Chlorhexidine Gluconate Cloth  6 each Topical Q0600   clonazePAM  0.5 mg Oral BID   midodrine  15 mg Oral TID WC   mirtazapine  15 mg Oral QHS   prazosin  1 mg Oral QHS   torsemide  40 mg Oral Daily      Subjective:   Raydyn Hewey was seen and examined today.  Yesterday's events noted, one-to-one sitter was placed.  This morning, denies active suicidal ideation, states that he was angry at his family for not visiting him yesterday.  States he has anger management issues.  Lower legs edema improving.  No shortness of breath  Objective:   Vitals:   10/30/23 1000 10/30/23 1100 10/30/23 1200 10/30/23 1300  BP: 110/75 121/82  125/81  Pulse: 93 (!) 102 (!) 105 93  Resp: (!) 25 17 (!) 22 (!) 22  Temp:   98.2 F (36.8 C)   TempSrc:   Oral   SpO2: 100% 96% 98% 100%  Weight:      Height:        Intake/Output Summary (Last 24 hours) at 10/30/2023 1513 Last data filed at 10/30/2023 1228 Gross per 24 hour  Intake 240 ml  Output 51 ml  Net 189 ml     Wt Readings from Last 3 Encounters:  10/30/23 107.5 kg  10/10/23 107.5 kg  09/03/23 109.8 kg    Physical Exam General: Alert and oriented x 3, NAD Cardiovascular: S1 S2 clear, RRR.  Respiratory: CTAB, no wheezing, rales or rhonchi Gastrointestinal: Soft, nontender, nondistended, NBS Ext: 1+ pedal edema bilaterally R>L Neuro: no new deficits Psych: Normal affect, poor judgment   Data Reviewed:  I have personally reviewed following labs    CBC Lab Results  Component Value Date   WBC 6.4 10/29/2023   RBC 3.98 (L) 10/29/2023   HGB 11.8 (L) 10/29/2023   HCT 36.9 (L) 10/29/2023   MCV 92.7 10/29/2023   MCH 29.6 10/29/2023   PLT 234 10/29/2023   MCHC 32.0 10/29/2023   RDW 14.7 10/29/2023   LYMPHSABS 0.9 10/27/2023   MONOABS 1.3 (H) 10/27/2023   EOSABS 0.2 10/27/2023   BASOSABS 0.0 10/27/2023     Last metabolic panel Lab Results  Component Value Date   NA 138 10/30/2023   K 3.5  10/30/2023   CL 103 10/30/2023   CO2 27 10/30/2023   BUN 40 (H) 10/30/2023   CREATININE 1.70 (H) 10/30/2023   GLUCOSE 110 (H) 10/30/2023   GFRNONAA 46 (L) 10/30/2023   GFRAA 58 (L) 11/16/2020   CALCIUM 8.7 (L) 10/30/2023   PHOS 3.8 10/23/2023   PROT 8.6 (H) 10/24/2023   ALBUMIN 4.2 10/24/2023   BILITOT 2.7 (H) 10/24/2023   ALKPHOS 98 10/24/2023   AST 50 (  H) 10/24/2023   ALT 28 10/24/2023   ANIONGAP 8 10/30/2023    CBG (last 3)  No results for input(s): "GLUCAP" in the last 72 hours.    Coagulation Profile: No results for input(s): "INR", "PROTIME" in the last 168 hours.   Radiology Studies: I have personally reviewed the imaging studies  No results found.     Thad Ranger M.D. [facility] 10/30/2023, 3:13 PM  Available via Epic secure chat 7am-7pm After 7 pm, please refer to night coverage provider listed on amion.

## 2023-10-30 NOTE — Consult Note (Addendum)
[name] Psychiatry Consult Evaluation  Service Date: October 30, 2023 LOS:  LOS: 16 days    Primary Psychiatric Diagnoses  MDD, recurrent, severe 2.  Demoralization syndrome 3. EtOH abuse (severe), cocaine use (sustained remission) 4. PTSD  Assessment  Dale [name] is a 62 y.o. male admitted medically for 10/13/2023  6:57 PM for CHF exacerbation. He carries the psychiatric diagnoses of EtOH abuse (severe), cocaine abuse (sustained remission) and MDD and has a past medical history of CAD, CHF, CKD, HLD, HTN, cardiomyopathy and anemia. On initial consult, patient's symptoms appeared consistent with chronic MDD and demoralization syndrome and was started on mirtazapine and clonazepam. Antipsychotics were avoided due to prolonged QTc as well as mirtazapine being the least impactful on QTc.  Psychiatry was consulted by Dr. Isidoro Donning for re-evaluation of SI. He had made SI statements over the phone 10/29/23 resulting in confiscation of cutlery knives in patient's possession and sitter to be ordered.   Upon assessment today, patient reports having fleeting thoughts of SI yesterday without intent or plan due to family not seeing him on Christmas. He has some chronic risk factors including history of suicide and male gender but has significantly more protective factors including spirituality, supportive family, cares for family, and future oriented.   While the future cannot be predicted, there do not appear to be acute safety concerns. He does not meet inpatient psychiatry or IVC criteria at this time. Referral to PHP has been made.    Diagnoses:  Active Hospital problems: Principal Problem:   Acute on chronic HFrEF (heart failure with reduced ejection fraction) (HCC) Active Problems:   Acute kidney injury superimposed on chronic kidney disease (HCC)   CKD (chronic kidney disease), stage III (HCC)   Alcohol use disorder, moderate, in early remission (HCC)   Acute on chronic systolic CHF  (congestive heart failure) (HCC)   Acute respiratory failure with hypoxia (HCC)   Hx of cocaine abuse (HCC)   Atherosclerosis of native coronary artery of native heart without angina pectoris   Cardiogenic shock (HCC)   Cardiorenal syndrome   Cardiac arrest [facility])     Plan  ## Psychiatric Medication Recommendations:  At time of dc, on following meds:  -- increase mirtazapine to 15 mg at bedtime for depression -- klonopin 0.5 mg BID for depression and anxiety  ## Medical Decision Making Capacity:  Not formally assessed   ## Further Work-up:  -- Last qtc 516 down from 580s  While pt on Qtc prolonging medications, please monitor & replete K+ to 4 and Mg2+ to 2 -- Pertinent labwork reviewed earlier this admission includes: UDS + THC but negative for cocaine  ## Disposition:  -- Does not meet criteria for inpt psych  -- PHP referral  ## Behavioral / Environmental:  --  Utilize compassion and acknowledge the patient's experiences while setting clear and realistic expectations for care.    ## Safety and Observation Level:  - Based on my clinical evaluation, I estimate the patient to be at low risk of self harm in the current setting - At this time, we recommend continuing a routine level of observation. This decision is based on my review of the chart including patient's history and current presentation, interview of the patient, mental status examination, and consideration of suicide risk including evaluating suicidal ideation, plan, intent, suicidal or self-harm behaviors, risk factors, and protective factors. This judgment is based on our ability to directly address suicide risk, implement suicide prevention strategies and develop a safety  plan while the patient is in the clinical setting. Please contact our team if there is a concern that risk level has changed.  Suicide risk assessment  Patient has following modifiable risk factors for suicide: under treated depression  and lack  of access to outpatient mental health resources, which we are addressing by addressing insomnia and brief supportive interactions; safest antidepressant available.   Patient has following non-modifiable or demographic risk factors for suicide: male gender and history of suicide attempt  Patient has the following protective factors against suicide: Supportive family and Cultural, spiritual, or religious beliefs that discourage suicide, future orientation   Thank you for this consult request. Recommendations have been communicated to the primary team.  We will continue to follow.  Park Pope, MD  Psychiatric and Social History   Relevant Aspects of Hospital Course:  Admitted on 10/13/2023 for CHF exacerbation. They have intermittently endorsed SI to staff as well as sporadic moments of refusing care. Has been switched to DNR. Palliative involved.   Per Dr. Betsey Holiday initial consult note His current presentation of worsening of chronic depressive symptoms and lack of faith things can get better with chronic medical condition is most consistent with known history of MDD and demoralization syndrome.He has had minimal past medication trials. He consented to trial of Remeron after discussion of r/b/se incl cardiac s/e with primary goal of improving in-hospital sleep for (hopefully) rapid improvement of mood and anxiety sx to baseline. Remeron with some risk of qtc prolongation however less than other agents; holding off on oral antipsychotics despite complaint of hallucinations (also explained by delirium, lack of sleep, EtOH w/d). Pt on tele at time of initial consult and generally values align around quality of life. Some element of delirium (in ICU/likely EtOH w/d) - things seem to get much worse at night for him so leaving sitter in place. On initial examination, patient was quite depressed but able to name some things he is looking forward to. Has struggled with comprehending scope of medical illness  throughout hospital course; making some statements about asking for a "suicide pill" and some about wanting to walk out of the hospital. Psych medication options limited in setting of advanced heart failure and prolonged qtc.   Since last consult by psychiatry, patient had transferred back to ICU due to respiratory distress with progression to respiratory arrest w/ hypoxia, bradycardia, vtach, and vfib. He was intubated but extubated 10/25/2023. He was made DNR 10/24/23 and transferred back to telemed 12/22. Plan is to discharge home with hospice today.  Patient Report:  Patient evaluated at bedside. Patient denies SI/HI/AVH.  He reports that he had fleeting SI yesterday without intent or plan due to family not seeing him like they stated they would on Christmas Day.  He reports that the knives that security confiscated was for his own protection as well as to eat with when he is at home.  He felt temporarily frustrated but was able to redirect himself and watched basketball games until ~1 AM. He denies any problems with sleep or appetite.  He feels eager to discharge because he has plans to see and spend time with family as well as in order to take care of himself. He states he plans to live with mom for several weeks before going back to living independently. He states he has a lot to live for including seeing his grandchildren and taking classes to educate himself. He reports to feeling good today with regards to mood. He feels significantly less  depressed than 3 weeks ago. He plans to continue taking psychotropics as prescribed.   Psych ROS on initial assessment:  Depression: Mostly anxious depressed picture - poor sleep, ruminative thoughts, always worried, poor concentration/appetite.  Anxiety:  tied into sx depression Mania (lifetime and current): denies - had manic sx while using cocaine, not since stopped Psychosis: (lifetime and current): hallucinations here in hospital  Collateral  information:  Penrose Rotta (daughter) @ [phone] Daughter states patient is doing better than before. She does express concern of relapse on alcohol or substances given psychosocial stressors and should immediately go home as compared to staying with family. She wants to make sure that patient does as well as he is able to given his current medical problems.  She denies any acute safety concerns at this time. All questions addressed.  Psychiatric History:  Information collected from pt, medical record  Prev Dx/Sx: multiple substance use d/o, depression, PTSD Current Psych Provider: none Home Meds (current): none Previous Med Trials: unknown Therapy: none  Prior ECT: no Prior Psych Hospitalization: it sounded like x1  Prior Self Harm: one attempt, hanging, a couple of years ago Prior Violence: not evaluated  Family Psych History: "they are all crazy" Family Hx suicide: uncles  Social History:  Divorced, married 19 years, 2 kids Developmental Hx: unknown Educational Hx: unknown Occupational Hx: disability for heart Legal Hx: unknown Living Situation: in a "drug den" Spiritual Hx: Spiritual Access to weapons: no  Substance History Tobacco use: no Alcohol use: yes, every day for several months, gets irritable, nervous, ugly when stops. Thinks he has had seizures but not sure. Helps sleep Drug use: marijuana daily    Exam Findings   Psychiatric Specialty Exam:  Mental Status Exam: General Appearance: Ill appearing  Orientation:  Full (Time, Place, and Person)  Memory:  Immediate;   Fair Recent;   Fair Remote;   Good  Concentration:  Concentration: Fair and Attention Span: Good  Recall:  Fair  Attention  Good  Eye Contact:  Good  Speech:  Clear and Coherent  Language:  Good  Volume:  Normal  Mood: 'I need to find housing"  Affect:  Congruent and Full Range  Thought Process:  Coherent and Goal Directed  Thought Content:  devoid of delusions/paranoia  Suicidal  Thoughts:  No  Homicidal Thoughts:  No  Judgement:  Fair  Insight:  poor  Psychomotor Activity:  Normal  Akathisia:  No  Fund of Knowledge:  Fair      Assets:  Manufacturing systems engineer Desire for Improvement    ADL's:  Impaired  AIMS (if indicated):   not indicated     Physical Exam: Vital signs:  Temp:  [97.3 F (36.3 C)-98.3 F (36.8 C)] 97.3 F (36.3 C) (12/26 0800) Pulse Rate:  [77-97] 81 (12/26 0600) Resp:  [9-41] 32 (12/26 0600) BP: (105-139)/(67-95) 111/72 (12/26 0600) SpO2:  [79 %-100 %] 100 % (12/26 0600) Weight:  [107.5 kg] 107.5 kg (12/26 0230) Physical Exam Vitals and nursing note reviewed.  Constitutional:      General: He is not in acute distress. HENT:     Head: Normocephalic and atraumatic.  Cardiovascular:     Rate and Rhythm: Normal rate.  Pulmonary:     Effort: No tachypnea or respiratory distress.    Blood pressure 111/72, pulse 81, temperature (!) 97.3 F (36.3 C), temperature source Axillary, resp. rate (!) 32, height 5\' 11"  (1.803 m), weight 107.5 kg, SpO2 100%. Body mass index is 33.05 kg/m.  Other History   These have been pulled in through the EMR, reviewed, and updated if appropriate.   Family History:  The patient's family history includes Hypertension in his maternal grandmother.  Medical History: Past Medical History:  Diagnosis Date   Alcohol abuse    CAD (coronary artery disease)    a. 05/2019 Cath: LM nl, LAD 15p, 49m, LCX large/nl, OM2 30, OM3 20, RCA 100 CTO w/ bridging L->R collats to RPDA.   Cardiorenal syndrome 10/22/2023   Chronic combined systolic and diastolic CHF (congestive heart failure) (HCC)    a. 09/27/18 Echo: EF 20-25%, grade 2 DD; b. 03/2019 Echo: EF 20-25%; c. 05/2021 Echo: EF <10%, glob HK. GrII DD. Sev red RV fxn. Sev BAE. Mild MR. Mild-mod TR.   CKD (chronic kidney disease), stage II    Cocaine abuse (HCC)    Hyperlipidemia LDL goal <70    Hypertension    Ischemic cardiomyopathy    a. 09/27/18 Echo: EF  20-25%, grade 2 DD; b. 03/2019 Echo: EF 20-25%; c. 05/2019 Cath: RCA 100 CTA, otw nonobs dzs; d. 05/2021 Echo: EF <10%, glob HK. GrII DD. Sev red RV fxn.   Morbid obesity (HCC)    Noncompliance    Normocytic anemia     Surgical History: Past Surgical History:  Procedure Laterality Date   RIGHT/LEFT HEART CATH AND CORONARY ANGIOGRAPHY N/A 05/10/2019   Procedure: RIGHT/LEFT HEART CATH AND CORONARY ANGIOGRAPHY;  Surgeon: Yvonne Kendall, MD;  Location: [facility] INVASIVE CV LAB;  Service: Cardiovascular;  Laterality: N/A;   RIGHT/LEFT HEART CATH AND CORONARY ANGIOGRAPHY N/A 03/18/2022   Procedure: RIGHT/LEFT HEART CATH AND CORONARY ANGIOGRAPHY;  Surgeon: Laurey Morale, MD;  Location: [facility] INVASIVE CV LAB;  Service: Cardiovascular;  Laterality: N/A;    Medications:   Current Facility-Administered Medications:    acetaminophen (TYLENOL) 160 MG/5ML solution 650 mg, 650 mg, Oral, Q6H PRN, 650 mg at 10/27/23 0803 **OR** acetaminophen (TYLENOL) suppository 650 mg, 650 mg, Rectal, Q6H PRN, Bell, Michelle T, RPH   alum & mag hydroxide-simeth (MAALOX/MYLANTA) 200-200-20 MG/5ML suspension 15 mL, 15 mL, Oral, Q4H PRN, Rai, Ripudeep K, MD   amiodarone (PACERONE) tablet 200 mg, 200 mg, Oral, Daily, Vassie Loll, Rakesh V, MD, 200 mg at 10/29/23 1016   aspirin chewable tablet 81 mg, 81 mg, Oral, Daily, Herby Abraham, RPH, 81 mg at 10/29/23 1017   Chlorhexidine Gluconate Cloth 2 % PADS 6 each, 6 each, Topical, Q0600, Hillary Bow, DO, 6 each at 10/30/23 0649   clonazePAM (KLONOPIN) tablet 0.5 mg, 0.5 mg, Oral, BID, 0.5 mg at 10/29/23 2158 **AND** [DISCONTINUED] clonazePAM (KLONOPIN) tablet 1 mg, 1 mg, Oral, QHS, Mariel Craft, MD, 1 mg at 10/17/23 2132   melatonin tablet 5 mg, 5 mg, Oral, QHS PRN, Len Childs T, RPH   midodrine (PROAMATINE) tablet 15 mg, 15 mg, Oral, TID WC, Simonne Martinet, NP, 15 mg at 10/30/23 0813   mirtazapine (REMERON SOL-TAB) disintegrating tablet 7.5 mg, 7.5 mg, Oral, QHS, Bell, Veatrice Bourbon, RPH, 7.5 mg at 10/29/23 2159   Oral care mouth rinse, 15 mL, Mouth Rinse, PRN, Osvaldo Shipper, MD   Oral care mouth rinse, 15 mL, Mouth Rinse, PRN, Oretha Milch, MD   oxyCODONE (Oxy IR/ROXICODONE) immediate release tablet 5 mg, 5 mg, Oral, Q4H PRN, Rochel Brome C, NP, 5 mg at 10/29/23 0459   polyvinyl alcohol (LIQUIFILM TEARS) 1.4 % ophthalmic solution 1 drop, 1 drop, Both Eyes, PRN, Rolly Salter, MD   prochlorperazine (COMPAZINE)  injection 10 mg, 10 mg, Intravenous, Q6H PRN, Rolly Salter, MD, 10 mg at 10/29/23 2040   torsemide (DEMADEX) tablet 40 mg, 40 mg, Oral, Daily, Oretha Milch, MD, 40 mg at 10/29/23 1018  Allergies: Allergies  Allergen Reactions   Hydrocodone Itching

## 2023-10-31 DIAGNOSIS — F332 Major depressive disorder, recurrent severe without psychotic features: Secondary | ICD-10-CM | POA: Diagnosis not present

## 2023-10-31 DIAGNOSIS — F4312 Post-traumatic stress disorder, chronic: Secondary | ICD-10-CM | POA: Diagnosis not present

## 2023-10-31 LAB — BASIC METABOLIC PANEL
Anion gap: 13 (ref 5–15)
BUN: 41 mg/dL — ABNORMAL HIGH (ref 6–20)
CO2: 24 mmol/L (ref 22–32)
Calcium: 9.3 mg/dL (ref 8.9–10.3)
Chloride: 101 mmol/L (ref 98–111)
Creatinine, Ser: 1.62 mg/dL — ABNORMAL HIGH (ref 0.61–1.24)
GFR, Estimated: 48 mL/min — ABNORMAL LOW (ref >60)
Glucose, Bld: 103 mg/dL — ABNORMAL HIGH (ref 70–99)
Potassium: 3.7 mmol/L (ref 3.5–5.1)
Sodium: 138 mmol/L (ref 135–145)

## 2023-10-31 LAB — URIC ACID: Uric Acid, Serum: 10.1 mg/dL — ABNORMAL HIGH (ref 3.7–8.6)

## 2023-10-31 LAB — MAGNESIUM: Magnesium: 2.2 mg/dL (ref 1.7–2.4)

## 2023-10-31 MED ORDER — ACETAMINOPHEN 10 MG/ML IV SOLN
1000.0000 mg | Freq: Once | INTRAVENOUS | Status: AC
  Administered 2023-10-31: 1000 mg via INTRAVENOUS
  Filled 2023-10-31: qty 100

## 2023-10-31 MED ORDER — COLCHICINE 0.3 MG HALF TABLET
0.3000 mg | ORAL_TABLET | Freq: Every day | ORAL | Status: DC
  Administered 2023-10-31 – 2023-11-01 (×2): 0.3 mg via ORAL
  Filled 2023-10-31 (×3): qty 1

## 2023-10-31 MED ORDER — ALLOPURINOL 100 MG PO TABS
100.0000 mg | ORAL_TABLET | Freq: Every day | ORAL | Status: DC
  Administered 2023-10-31 – 2023-11-02 (×3): 100 mg via ORAL
  Filled 2023-10-31 (×3): qty 1

## 2023-10-31 NOTE — Progress Notes (Signed)
Brief Nutrition Note  Patient was made comfort care on 12/20 by family. He was able to be extubated 12/21 and did well so comfort care was rescinded. He is now being followed by psych and awaiting their clearance for discharge.  Patient has been eating 100% of meals since diet advanced 12/21.  Will sign off at this time. If nutrition needs arise, please re-consult.   Shelle Iron RD, LDN Contact via [employer].

## 2023-10-31 NOTE — Plan of Care (Signed)
  Problem: Pain Management: Goal: General experience of comfort will improve Outcome: Progressing   Problem: Safety: Goal: Ability to remain free from injury will improve Outcome: Progressing   Problem: Skin Integrity: Goal: Risk for impaired skin integrity will decrease Outcome: Progressing   Problem: Fluid Volume: Goal: Ability to maintain a balanced intake and output will improve Outcome: Progressing

## 2023-10-31 NOTE — Consult Note (Signed)
Dale [name] Psychiatry Consult Followup  Service Date: October 31, 2023 LOS:  LOS: 17 days    Primary Psychiatric Diagnoses  MDD, recurrent, severe 2.  Demoralization syndrome 3. EtOH abuse (severe), cocaine use (sustained remission) 4. PTSD  Assessment  Dale [name] is a 62 y.o. male admitted medically for 10/13/2023  6:57 PM for CHF exacerbation. He carries the psychiatric diagnoses of EtOH abuse (severe), cocaine abuse (sustained remission) and MDD and has a past medical history of CAD, CHF, CKD, HLD, HTN, cardiomyopathy and anemia. On initial consult, patient's symptoms appeared consistent with chronic MDD and demoralization syndrome and was started on mirtazapine and clonazepam. Antipsychotics were avoided due to prolonged QTc as well as mirtazapine being the least impactful on QTc.  Psychiatry was consulted by Dr. Isidoro Donning for re-evaluation of SI. He had made SI statements over the phone 10/29/23 resulting in confiscation of cutlery knives in patient's possession and sitter to be ordered.   Upon reassessment today, patient is again in good spirits and future oriented. He was seeing doing physical therapy. Patient endorses fleeting SI 2 days ago which has since resolved and reports chronic intermittent SI for the past 2 years. Patient denies SI, plan or intent today and is future oriented on discharge from the hospital. Patient is interested in outpatient psychiatric services and was interested in attending IOP program in person however insurance only accepted at virtual PHP through our referral network and this referral was place with first availability on 11/09/22. He has some chronic risk factors including history of suicide attempts and male gender but has significantly more protective factors including spirituality, supportive family, cares for family, and future oriented. Overall patient is at low acute risk for suicide although struggles with chronic depressive and PTSD symptoms.  Tolerating increase of Remeron and my plan would be to increase to 30 mg in a week which was discussed with patient. Qtc has remained <500 on last EKG.   Diagnoses:  Active Hospital problems: Principal Problem:   Acute on chronic HFrEF (heart failure with reduced ejection fraction) (HCC) Active Problems:   Acute kidney injury superimposed on chronic kidney disease (HCC)   CKD (chronic kidney disease), stage III (HCC)   Alcohol use disorder, moderate, in early remission (HCC)   Acute on chronic systolic CHF (congestive heart failure) (HCC)   Acute respiratory failure with hypoxia (HCC)   Hx of cocaine abuse (HCC)   Atherosclerosis of native coronary artery of native heart without angina pectoris   Cardiogenic shock (HCC)   Cardiorenal syndrome   Cardiac arrest (HCC)   Chronic post-traumatic stress disorder (PTSD)   MDD (major depressive disorder), recurrent severe, without psychosis (HCC)     Plan  ## Psychiatric Medication Recommendations:  At time of dc, on following meds:  -- cont mirtazapine 15 mg at bedtime for depression -- klonopin 0.5 mg BID for depression and anxiety, recommended slow taper with patient given his PTSD diagnosis and benzodiazapine being a relative contraindication  ## Medical Decision Making Capacity:  Not formally assessed   ## Further Work-up:   -- Last qtc 447 and has been decreasing  While pt on Qtc prolonging medications, please monitor & replete K+ to 4 and Mg2+ to 2 -- Pertinent labwork reviewed earlier this admission includes: UDS + THC but negative for cocaine  ## Disposition:  -- Does not meet criteria for inpt psych  -- PHP referral  ## Behavioral / Environmental:  --  Utilize compassion and acknowledge the patient's experiences  while setting clear and realistic expectations for care.    ## Safety and Observation Level:  - Based on my clinical evaluation, I estimate the patient to be at low risk of self harm in the current setting -  At this time, we recommend continuing a routine level of observation. This decision is based on my review of the chart including patient's history and current presentation, interview of the patient, mental status examination, and consideration of suicide risk including evaluating suicidal ideation, plan, intent, suicidal or self-harm behaviors, risk factors, and protective factors. This judgment is based on our ability to directly address suicide risk, implement suicide prevention strategies and develop a safety plan while the patient is in the clinical setting. Please contact our team if there is a concern that risk level has changed.  Suicide risk assessment  Patient has following modifiable risk factors for suicide: under treated depression  and lack of access to outpatient mental health resources, which we are addressing by addressing insomnia and brief supportive interactions; safest antidepressant available.   Patient has following non-modifiable or demographic risk factors for suicide: male gender and history of suicide attempt  Patient has the following protective factors against suicide: Supportive family and Cultural, spiritual, or religious beliefs that discourage suicide, future orientation   Thank you for this consult request. Recommendations have been communicated to the primary team.  We will continue to follow peripherally until patient is discharged, however he is cleared for outpatient followup from psych standpoint. Will sign off, please reconsult if any further concerns.  Miguel Rota, MD  Psychiatric and Social History   Relevant Aspects of Hospital Course:  Admitted on 10/13/2023 for CHF exacerbation. They have intermittently endorsed SI to staff as well as sporadic moments of refusing care. Has been switched to DNR. Palliative involved.   Per Dr. Betsey Holiday initial consult note His current presentation of worsening of chronic depressive symptoms and lack of faith things  can get better with chronic medical condition is most consistent with known history of MDD and demoralization syndrome.He has had minimal past medication trials. He consented to trial of Remeron after discussion of r/b/se incl cardiac s/e with primary goal of improving in-hospital sleep for (hopefully) rapid improvement of mood and anxiety sx to baseline. Remeron with some risk of qtc prolongation however less than other agents; holding off on oral antipsychotics despite complaint of hallucinations (also explained by delirium, lack of sleep, EtOH w/d). Pt on tele at time of initial consult and generally values align around quality of life. Some element of delirium (in ICU/likely EtOH w/d) - things seem to get much worse at night for him so leaving sitter in place. On initial examination, patient was quite depressed but able to name some things he is looking forward to. Has struggled with comprehending scope of medical illness throughout hospital course; making some statements about asking for a "suicide pill" and some about wanting to walk out of the hospital. Psych medication options limited in setting of advanced heart failure and prolonged qtc.   Since last consult by psychiatry, patient had transferred back to ICU due to respiratory distress with progression to respiratory arrest w/ hypoxia, bradycardia, vtach, and vfib. He was intubated but extubated 10/25/2023. He was made DNR 10/24/23 and transferred back to telemed 12/22.   Patient Report:  Patient evaluated at bedside. Patient denies SI/HI/AVH.  He reports that he had fleeting SI 2 days ago without intent or plan due to family not seeing him like they stated  they would on Christmas Day. Patient notes he felt lied to. Denies wanting to be dead and states he plans to followup with IOP referral and continue taking mirtazapine which he reports has been helpful for his mood. He reports that the knives that security confiscated was for his own protection as  well as to eat with when he is at home. Reports good sleep and appetite and patient was seen participating in physical therapy.  Patient states he looks forward to seeing his grandchildren. Reports good sleep, appetite and energy.   Psych ROS on initial assessment:  Depression: Mostly anxious depressed picture - poor sleep, ruminative thoughts, always worried, poor concentration/appetite.  Anxiety:  tied into sx depression Mania (lifetime and current): denies - had manic sx while using cocaine, not since stopped Psychosis: (lifetime and current): hallucinations here in hospital  Collateral information:  Neriah Schacher (daughter) @ [phone] Daughter states patient is doing better than before. She does express concern of relapse on alcohol or substances given psychosocial stressors and should immediately go home as compared to staying with family. She wants to make sure that patient does as well as he is able to given his current medical problems.  She denies any acute safety concerns at this time. All questions addressed.  Psychiatric History:  Information collected from pt, medical record  Prev Dx/Sx: multiple substance use d/o, depression, PTSD Current Psych Provider: none Home Meds (current): none Previous Med Trials: unknown Therapy: none  Prior ECT: no Prior Psych Hospitalization: it sounded like x1  Prior Self Harm: one attempt, hanging, a couple of years ago Prior Violence: not evaluated  Family Psych History: "they are all crazy" Family Hx suicide: uncles  Social History:  Divorced, married 19 years, 2 kids Developmental Hx: unknown Educational Hx: unknown Occupational Hx: disability for heart Legal Hx: unknown Living Situation: in a "drug den" Spiritual Hx: Spiritual Access to weapons: no  Substance History Tobacco use: no Alcohol use: yes, every day for several months, gets irritable, nervous, ugly when stops. Thinks he has had seizures but not sure. Helps  sleep Drug use: marijuana daily    Exam Findings   Psychiatric Specialty Exam:  Mental Status Exam: General Appearance: Ill appearing  Orientation:  Full (Time, Place, and Person)  Memory:  Immediate;   Fair Recent;   Fair Remote;   Good  Concentration:  Concentration: Fair and Attention Span: Good  Recall:  Fair  Attention  Good  Eye Contact:  Good  Speech:  Clear and Coherent  Language:  Good  Volume:  Normal  Mood: 'I need to find housing"  Affect:  Congruent and Full Range  Thought Process:  Coherent and Goal Directed  Thought Content:  devoid of delusions/paranoia  Suicidal Thoughts:  No  Homicidal Thoughts:  No  Judgement:  Fair  Insight:  poor  Psychomotor Activity:  Normal  Akathisia:  No  Fund of Knowledge:  Fair      Assets:  Communication Skills Desire for Improvement    ADL's:  Impaired  AIMS (if indicated):   not indicated     Physical Exam: Vital signs:  Temp:  [97.5 F (36.4 C)-98.1 F (36.7 C)] 97.6 F (36.4 C) (12/27 1157) Pulse Rate:  [80-104] 81 (12/27 1159) Resp:  [17-18] 18 (12/27 1157) BP: (84-127)/(65-107) 93/65 (12/27 1159) SpO2:  [96 %-100 %] 100 % (12/27 1159) Weight:  [107.9 kg] 107.9 kg (12/26 1540) Physical Exam Vitals and nursing note reviewed.  Constitutional:  General: He is not in acute distress. HENT:     Head: Normocephalic and atraumatic.  Cardiovascular:     Rate and Rhythm: Normal rate.  Pulmonary:     Effort: No tachypnea or respiratory distress.     Blood pressure 93/65, pulse 81, temperature 97.6 F (36.4 C), temperature source Oral, resp. rate 18, height 5\' 11"  (1.803 m), weight 107.9 kg, SpO2 100%. Body mass index is 33.18 kg/m.   Other History   These have been pulled in through the EMR, reviewed, and updated if appropriate.   Family History:  The patient's family history includes Hypertension in his maternal grandmother.  Medical History: Past Medical History:  Diagnosis Date   Alcohol  abuse    CAD (coronary artery disease)    a. 05/2019 Cath: LM nl, LAD 15p, 24m, LCX large/nl, OM2 30, OM3 20, RCA 100 CTO w/ bridging L->R collats to RPDA.   Cardiorenal syndrome 10/22/2023   Chronic combined systolic and diastolic CHF (congestive heart failure) (HCC)    a. 09/27/18 Echo: EF 20-25%, grade 2 DD; b. 03/2019 Echo: EF 20-25%; c. 05/2021 Echo: EF <10%, glob HK. GrII DD. Sev red RV fxn. Sev BAE. Mild MR. Mild-mod TR.   CKD (chronic kidney disease), stage II    Cocaine abuse (HCC)    Hyperlipidemia LDL goal <70    Hypertension    Ischemic cardiomyopathy    a. 09/27/18 Echo: EF 20-25%, grade 2 DD; b. 03/2019 Echo: EF 20-25%; c. 05/2019 Cath: RCA 100 CTA, otw nonobs dzs; d. 05/2021 Echo: EF <10%, glob HK. GrII DD. Sev red RV fxn.   Morbid obesity (HCC)    Noncompliance    Normocytic anemia     Surgical History: Past Surgical History:  Procedure Laterality Date   RIGHT/LEFT HEART CATH AND CORONARY ANGIOGRAPHY N/A 05/10/2019   Procedure: RIGHT/LEFT HEART CATH AND CORONARY ANGIOGRAPHY;  Surgeon: Yvonne Kendall, MD;  Location: [facility] INVASIVE CV LAB;  Service: Cardiovascular;  Laterality: N/A;   RIGHT/LEFT HEART CATH AND CORONARY ANGIOGRAPHY N/A 03/18/2022   Procedure: RIGHT/LEFT HEART CATH AND CORONARY ANGIOGRAPHY;  Surgeon: Laurey Morale, MD;  Location: [facility] INVASIVE CV LAB;  Service: Cardiovascular;  Laterality: N/A;    Medications:   Current Facility-Administered Medications:    allopurinol (ZYLOPRIM) tablet 100 mg, 100 mg, Oral, Daily, Regalado, Belkys A, MD   alum & mag hydroxide-simeth (MAALOX/MYLANTA) 200-200-20 MG/5ML suspension 15 mL, 15 mL, Oral, Q4H PRN, Rai, Ripudeep K, MD   amiodarone (PACERONE) tablet 200 mg, 200 mg, Oral, Daily, Rai, Ripudeep K, MD, 200 mg at 10/31/23 0960   aspirin chewable tablet 81 mg, 81 mg, Oral, Daily, Rai, Ripudeep K, MD, 81 mg at 10/31/23 4540   Chlorhexidine Gluconate Cloth 2 % PADS 6 each, 6 each, Topical, Q0600, Rai, Ripudeep K, MD, 6 each at  10/30/23 0649   clonazePAM (KLONOPIN) tablet 0.5 mg, 0.5 mg, Oral, BID, 0.5 mg at 10/31/23 9811 **AND** [DISCONTINUED] clonazePAM (KLONOPIN) tablet 1 mg, 1 mg, Oral, QHS, Mariel Craft, MD, 1 mg at 10/17/23 2132   colchicine tablet 0.3 mg, 0.3 mg, Oral, Daily, Regalado, Belkys A, MD   melatonin tablet 5 mg, 5 mg, Oral, QHS PRN, Rai, Ripudeep K, MD   midodrine (PROAMATINE) tablet 15 mg, 15 mg, Oral, TID WC, Rai, Ripudeep K, MD, 15 mg at 10/31/23 1232   mirtazapine (REMERON SOL-TAB) disintegrating tablet 15 mg, 15 mg, Oral, QHS, Jaquala Fuller, MD, 15 mg at 10/30/23 2135   Oral care mouth rinse, 15  mL, Mouth Rinse, PRN, Rai, Ripudeep K, MD   Oral care mouth rinse, 15 mL, Mouth Rinse, PRN, Rai, Ripudeep K, MD   oxyCODONE (Oxy IR/ROXICODONE) immediate release tablet 5 mg, 5 mg, Oral, Q4H PRN, Rai, Ripudeep K, MD, 5 mg at 10/31/23 1234   polyvinyl alcohol (LIQUIFILM TEARS) 1.4 % ophthalmic solution 1 drop, 1 drop, Both Eyes, PRN, Rai, Ripudeep K, MD   prochlorperazine (COMPAZINE) injection 10 mg, 10 mg, Intravenous, Q6H PRN, Rai, Ripudeep K, MD, 10 mg at 10/29/23 2040   torsemide (DEMADEX) tablet 40 mg, 40 mg, Oral, Daily, Rai, Ripudeep K, MD, 40 mg at 10/31/23 7829  Allergies: Allergies  Allergen Reactions   Hydrocodone Itching

## 2023-10-31 NOTE — Progress Notes (Signed)
SATURATION QUALIFICATIONS: (This note is used to comply with regulatory documentation for home oxygen)  Patient Saturations on Room Air at Rest = 100%  Patient Saturations on Room Air while Ambulating = 100%  Patient Saturations on 0 Liters of oxygen while Ambulating = 100%  Please briefly explain why patient needs home oxygen: No O2 needed

## 2023-10-31 NOTE — Progress Notes (Signed)
[facility]                                                                              Dale [name], is a 62 y.o. male, DOB - 17-May-1961, QMV:[id number] Admit date - 10/13/2023    Outpatient Primary MD for the patient is Pcp, No  LOS - 17  days  Chief Complaint  Patient presents with   Shortness of Breath   Leg Swelling   Suicidal       Brief summary   Patient is a 62 year old male with end-stage HFrEF, prior cocaine use admitted 12/9 with acute systolic heart failure.  LVEF less than 20% with severe LV dilatation and RV dysfunction.  He was treated with inotropes and diuresed.  Renal function worsened.  Palliative discussions ongoing.  There were some concerns about patient's capacity for decision-making and suicidal ideation, psychiatry was consulted.  There were multiple occasions where patient was refusing care. On 12/20, he was transferred back to the ICU from the floor due to respiratory distress and progressed to respiratory arrest with hypoxia, bradycardia V. tach and V-fib.  Required epi x 2, CPR with ROSC.  Intubated.  PCCM consulted Significant Hospital Events:   12/20 V-fib cardiac arrest , made DNR , family initially opted for comfort care but then reversed since he was improving 12/21 extubated 12/23 still on pressors. Off oxygen. Comfortable family wanting to support thru christmas  Pressors off.  [facility] assumed care on 12/24 He has been clear by Psych for discharge.  Having right LE pain and edema.   Assessment & Plan    Principal Problem:  V-fib cardiac arrest in the setting of end-stage cardiomyopathy/CHF, hyperkalemia metabolic acidosis -Resolved after treatment of hyperkalemia. -Continue amiodarone 200 mg daily  Acute on chronic systolic heart failure-end-stage Acute respiratory failure with hypoxia -Initially placed on advanced heart failure therapies, transition from milrinone to digoxin and midodrine -Per CCM, no other options for  advanced therapies due to substance abuse, psych issues and being unhoused.  He was placed on norepinephrine for BP support, weaned off on 10/27/2023 h -Placed on midodrine 15 mg 3 times daily, torsemide 40 mg daily, amiodarone 200 mg daily -Given on-going hypotension not candidate for GDMT -Goals of care has been discussed by CCM and hospice appropriate, no escalation of care.  If further deterioration, transition to comfort care. -Net negative of 18.56 L since admission, weight 252 on admission-> 236.9 lbs today, on torsemide 40 mg daily.  Titrate up torsemide if tolerated with BP (was on 80 mg outpatient). -O2 sats 100% on 2 L.  Not on O2 prior to admission.  Weaning off oxygen. Check oxygen on ambulation.     Severe MDD, PTSD, suicidal ideation History of polysubstance abuse -Psychiatry was consulted again for reevaluation for SI.  Patient made suicidal ideation statements over the phone yesterday 12/25 with plan using the knives.  The cutlery knives were confiscated by security. -Patient was seen by psychiatry today, Dr Woodroe Mode,. Continue   Remeron.  Currently on Klonopin as well. -Clear by psych for discharge.   Acute kidney injury, cardiorenal syndrome, hyperkalemia, superimposed on CKD stage IIIb -Baseline  creatinine ~2.1 -Continue current management -Creatinine proving, 1.7   Hypokalemia -Potassium 3.4, replaced   LE edema; Right Check doppler.  Uric acid elevated./ started allopurinol.  Will give low dose colchicine.   Obesity type 1, needs life style modification.  Estimated body mass index is 33.18 kg/m as calculated from the following:   Height as of this encounter: 5\' 11"  (1.803 m).   Weight as of this encounter: 107.9 kg.  Code Status: DNR limited DVT Prophylaxis:  Place TED hose Start: 10/31/23 1315 Place TED hose Start: 10/30/23 1237   Level of Care: Level of care: Telemetry Family Communication: Updated patient Disposition Plan:      Remains inpatient  appropriate:  home tomorrow.   Procedures:    Consultants:   PCCM Palliative medicine Cardiology  Antimicrobials:   Anti-infectives (From admission, onward)    None          Medications  allopurinol  100 mg Oral Daily   amiodarone  200 mg Oral Daily   aspirin  81 mg Oral Daily   Chlorhexidine Gluconate Cloth  6 each Topical Q0600   clonazePAM  0.5 mg Oral BID   colchicine  0.3 mg Oral Daily   midodrine  15 mg Oral TID WC   mirtazapine  15 mg Oral QHS   torsemide  40 mg Oral Daily      Subjective:   Dale [name] complaints or right LE pain, edema.  Breathing ok.    Objective:   Vitals:   10/30/23 2333 10/31/23 0402 10/31/23 1157 10/31/23 1159  BP: (!) 124/107 107/65 (!) 84/75 93/65  Pulse: 88 (!) 104 80 81  Resp: 18 18 18    Temp: 97.7 F (36.5 C)  97.6 F (36.4 C)   TempSrc: Oral  Oral   SpO2: 100% 96% 99% 100%  Weight:      Height:        Intake/Output Summary (Last 24 hours) at 10/31/2023 1703 Last data filed at 10/31/2023 1100 Gross per 24 hour  Intake 960 ml  Output 975 ml  Net -15 ml     Wt Readings from Last 3 Encounters:  10/30/23 107.9 kg  10/10/23 107.5 kg  09/03/23 109.8 kg    Physical Exam General: NAD Cardiovascular: S 1, S 2 RRR Respiratory:CTA Gastrointestinal: BS present, soft, nt Ext: plus 2 edema right LE  Data Reviewed:  I have personally reviewed following labs    CBC Lab Results  Component Value Date   WBC 6.4 10/29/2023   RBC 3.98 (L) 10/29/2023   HGB 11.8 (L) 10/29/2023   HCT 36.9 (L) 10/29/2023   MCV 92.7 10/29/2023   MCH 29.6 10/29/2023   PLT 234 10/29/2023   MCHC 32.0 10/29/2023   RDW 14.7 10/29/2023   LYMPHSABS 0.9 10/27/2023   MONOABS 1.3 (H) 10/27/2023   EOSABS 0.2 10/27/2023   BASOSABS 0.0 10/27/2023     Last metabolic panel Lab Results  Component Value Date   NA 138 10/31/2023   K 3.7 10/31/2023   CL 101 10/31/2023   CO2 24 10/31/2023   BUN 41 (H) 10/31/2023   CREATININE 1.62  (H) 10/31/2023   GLUCOSE 103 (H) 10/31/2023   GFRNONAA 48 (L) 10/31/2023   GFRAA 58 (L) 11/16/2020   CALCIUM 9.3 10/31/2023   PHOS 3.8 10/23/2023   PROT 8.6 (H) 10/24/2023   ALBUMIN 4.2 10/24/2023   BILITOT 2.7 (H) 10/24/2023   ALKPHOS 98 10/24/2023   AST 50 (H) 10/24/2023   ALT 28 10/24/2023  ANIONGAP 13 10/31/2023    CBG (last 3)  No results for input(s): "GLUCAP" in the last 72 hours.    Coagulation Profile: No results for input(s): "INR", "PROTIME" in the last 168 hours.   Radiology Studies: I have personally reviewed the imaging studies  No results found.     Alba Cory M.D. [facility] 10/31/2023, 5:03 PM  Available via Epic secure chat 7am-7pm After 7 pm, please refer to night coverage provider listed on amion.

## 2023-10-31 NOTE — Progress Notes (Signed)
Received phone call from Burnard Leigh, patient's mother. She stated that the patient "can not come to live with her". She also stated she "has also notified the police". Ms. Okey Dupre stated that if anyone needed to contact her, she an be reached in late evening.

## 2023-10-31 NOTE — TOC Progression Note (Signed)
Transition of Care [facility]) - Progression Note    Patient Details  Name: Dale [name] MRN: [id number] Date of Birth: 11-17-1960  Transition of Care [facility]) CM/SW Contact  Larrie Kass, LCSW Phone Number: 10/31/2023, 4:10 PM  Clinical Narrative:     CSW spoke with the patient. He stated that his plan is to stay at a friend's home at [address] [place], [address], upon discharge. The patient is no longer in need of home oxygen, per the patient's RN. The patient can receive a taxi voucher at the time of discharge. TOC to follow for discharge needs  Expected Discharge Plan: Home/Self Care Barriers to Discharge: Continued Medical Work up  Expected Discharge Plan and Services   Discharge Planning Services: CM Consult Post Acute Care Choice: NA Living arrangements for the past 2 months: Homeless Shelter Expected Discharge Date: 10/30/23               DME Arranged: N/A DME Agency: NA       HH Arranged: NA HH Agency: NA         Social Determinants of Health (SDOH) Interventions SDOH Screenings   Food Insecurity: Patient Unable To Answer (10/14/2023)  Recent Concern: Food Insecurity - Food Insecurity Present (07/21/2023)  Housing: Medium Risk (10/14/2023)  Transportation Needs: Unmet Transportation Needs (10/14/2023)  Utilities: Patient Unable To Answer (10/14/2023)  Alcohol Screen: High Risk (02/20/2022)  Depression (PHQ2-9): Medium Risk (05/05/2019)  Financial Resource Strain: High Risk (08/25/2023)  Tobacco Use: Low Risk  (10/14/2023)    Readmission Risk Interventions    10/20/2023    3:32 PM 10/19/2023    2:46 PM 02/18/2023   12:36 PM  Readmission Risk Prevention Plan  Transportation Screening Complete Complete Complete  PCP or Specialist Appt within 3-5 Days   Complete  HRI or Home Care Consult   Complete  Social Work Consult for Recovery Care Planning/Counseling   Complete  Palliative Care Screening   Not Applicable  Medication Review Furniture conservator/restorer) Complete  Complete  PCP or Specialist appointment within 3-5 days of discharge Complete --   HRI or Home Care Consult Complete Not Complete   HRI or Home Care Consult Pt Refusal Comments  Homeless   SW Recovery Care/Counseling Consult Complete Complete   Palliative Care Screening Complete    Skilled Nursing Facility Not Applicable

## 2023-11-01 ENCOUNTER — Inpatient Hospital Stay (HOSPITAL_COMMUNITY): Payer: MEDICAID

## 2023-11-01 DIAGNOSIS — F4312 Post-traumatic stress disorder, chronic: Secondary | ICD-10-CM | POA: Diagnosis not present

## 2023-11-01 DIAGNOSIS — F332 Major depressive disorder, recurrent severe without psychotic features: Secondary | ICD-10-CM | POA: Diagnosis not present

## 2023-11-01 DIAGNOSIS — R609 Edema, unspecified: Secondary | ICD-10-CM

## 2023-11-01 NOTE — Progress Notes (Signed)
RT started overnight pulse oximetry study. Patient is on RA at this time. RN will contact this RT if she has to put patient on Oxygen. RT educated patient on the importance of this study. Patient understood.

## 2023-11-01 NOTE — Progress Notes (Signed)
[facility]                                                                              Kymari Andren, is a 62 y.o. male, DOB - 10/16/1961, ZOX:[id number] Admit date - 10/13/2023    Outpatient Primary MD for the patient is Pcp, No  LOS - 18  days  Chief Complaint  Patient presents with   Shortness of Breath   Leg Swelling   Suicidal       Brief summary   Patient is a 62 year old male with end-stage HFrEF, prior cocaine use admitted 12/9 with acute systolic heart failure.  LVEF less than 20% with severe LV dilatation and RV dysfunction.  He was treated with inotropes and diuresed.  Renal function worsened.  Palliative discussions ongoing.  There were some concerns about patient's capacity for decision-making and suicidal ideation, psychiatry was consulted.  There were multiple occasions where patient was refusing care. On 12/20, he was transferred back to the ICU from the floor due to respiratory distress and progressed to respiratory arrest with hypoxia, bradycardia V. tach and V-fib.  Required epi x 2, CPR with ROSC.  Intubated.  PCCM consulted Significant Hospital Events:   12/20 V-fib cardiac arrest , made DNR , family initially opted for comfort care but then reversed since he was improving 12/21 extubated 12/23 still on pressors. Off oxygen. Comfortable family wanting to support thru christmas  Pressors off.  TR[facility] assumed care on 12/24 He has been clear by Psych for discharge.  Having right LE pain and edema.   Assessment & Plan    Principal Problem:  V-fib cardiac arrest in the setting of end-stage cardiomyopathy/CHF, hyperkalemia metabolic acidosis -Resolved after treatment of hyperkalemia. -Continue amiodarone 200 mg daily  Acute on chronic systolic heart failure-end-stage Acute respiratory failure with hypoxia -Initially placed on advanced heart failure therapies, transition from milrinone to digoxin and midodrine -Per CCM, no other options for  advanced therapies due to substance abuse, psych issues and being unhoused.  He was placed on norepinephrine for BP support, weaned off on 10/27/2023 h -Placed on midodrine 15 mg 3 times daily, torsemide 40 mg daily, amiodarone 200 mg daily -Given on-going hypotension not candidate for GDMT -Goals of care has been discussed by CCM and hospice appropriate, no escalation of care.  If further deterioration, transition to comfort care. -Net negative of 18.56 L since admission, weight 252 on admission-> 236.9 lbs today, on torsemide 40 mg daily.  Titrate up torsemide if tolerated with BP (was on 80 mg outpatient). He was placed on oxygen last night, wake up with SOB suddenly.  Plan to check pulse oxymetry overnight to see if he will require oxygen.     Severe MDD, PTSD, suicidal ideation History of polysubstance abuse -Psychiatry was consulted again for reevaluation for SI.  Patient made suicidal ideation statements over the phone yesterday 12/25 with plan using the knives.  The cutlery knives were confiscated by security. -Patient was seen by psychiatry today, Dr Woodroe Mode,. Continue   Remeron.  Currently on Klonopin as well. -Clear by psych for discharge.   Acute kidney injury, cardiorenal syndrome, hyperkalemia, superimposed  on CKD stage IIIb -Baseline creatinine ~2.1 -Continue current management -Creatinine proving, 1.7   Hypokalemia -Potassium 3.4, replaced   LE edema; Right doppler. No DVT Uric acid elevated./ started allopurinol.  Will give low dose colchicine.   Obesity type 1, needs life style modification.  Estimated body mass index is 33.18 kg/m as calculated from the following:   Height as of this encounter: 5\' 11"  (1.803 m).   Weight as of this encounter: 107.9 kg.  Code Status: DNR limited DVT Prophylaxis:  Place TED hose Start: 10/31/23 1315 Place TED hose Start: 10/30/23 1237   Level of Care: Level of care: Telemetry Family Communication: Updated patient Disposition  Plan:      Remains inpatient appropriate:  discharge tomorrow. Will check pulse oxymetry tonight  Procedures:    Consultants:   PCCM Palliative medicine Cardiology  Antimicrobials:   Anti-infectives (From admission, onward)    None          Medications  allopurinol  100 mg Oral Daily   amiodarone  200 mg Oral Daily   aspirin  81 mg Oral Daily   Chlorhexidine Gluconate Cloth  6 each Topical Q0600   clonazePAM  0.5 mg Oral BID   colchicine  0.3 mg Oral Daily   midodrine  15 mg Oral TID WC   mirtazapine  15 mg Oral QHS   torsemide  40 mg Oral Daily      Subjective:   Dale [name] \ Had an episode of SOB last night, wake up and couldn't breath.  Pain in the leg is better.    Objective:   Vitals:   10/31/23 1159 10/31/23 2142 11/01/23 0628 11/01/23 1242  BP: 93/65 (!) 109/98 (!) 121/91 104/76  Pulse: 81 100 91 80  Resp:  (!) 22 (!) 22 20  Temp:  98 F (36.7 C) 97.8 F (36.6 C) 97.6 F (36.4 C)  TempSrc:  Oral Oral Oral  SpO2: 100% 100% 100% 99%  Weight:      Height:        Intake/Output Summary (Last 24 hours) at 11/01/2023 1639 Last data filed at 11/01/2023 1619 Gross per 24 hour  Intake 1340 ml  Output 1130 ml  Net 210 ml     Wt Readings from Last 3 Encounters:  10/30/23 107.9 kg  10/10/23 107.5 kg  09/03/23 109.8 kg    Physical Exam General: NAD Cardiovascular: S 1, S 2 RRR Respiratory: CTA Gastrointestinal: BS present, soft, nt Ext: plus 2 edema right LE  Data Reviewed:  I have personally reviewed following labs    CBC Lab Results  Component Value Date   WBC 6.4 10/29/2023   RBC 3.98 (L) 10/29/2023   HGB 11.8 (L) 10/29/2023   HCT 36.9 (L) 10/29/2023   MCV 92.7 10/29/2023   MCH 29.6 10/29/2023   PLT 234 10/29/2023   MCHC 32.0 10/29/2023   RDW 14.7 10/29/2023   LYMPHSABS 0.9 10/27/2023   MONOABS 1.3 (H) 10/27/2023   EOSABS 0.2 10/27/2023   BASOSABS 0.0 10/27/2023     Last metabolic panel Lab Results  Component  Value Date   NA 138 10/31/2023   K 3.7 10/31/2023   CL 101 10/31/2023   CO2 24 10/31/2023   BUN 41 (H) 10/31/2023   CREATININE 1.62 (H) 10/31/2023   GLUCOSE 103 (H) 10/31/2023   GFRNONAA 48 (L) 10/31/2023   GFRAA 58 (L) 11/16/2020   CALCIUM 9.3 10/31/2023   PHOS 3.8 10/23/2023   PROT 8.6 (H) 10/24/2023  ALBUMIN 4.2 10/24/2023   BILITOT 2.7 (H) 10/24/2023   ALKPHOS 98 10/24/2023   AST 50 (H) 10/24/2023   ALT 28 10/24/2023   ANIONGAP 13 10/31/2023    CBG (last 3)  No results for input(s): "GLUCAP" in the last 72 hours.    Coagulation Profile: No results for input(s): "INR", "PROTIME" in the last 168 hours.   Radiology Studies: I have personally reviewed the imaging studies  VAS [place] LOWER EXTREMITY VENOUS (DVT) Result Date: 11/01/2023  Lower Venous DVT Study Patient Name:  SADIE ZANDER  Date of Exam:   11/01/2023 Medical Rec #: [id number]            Accession #:    [id number] Date of Birth: 06-23-1961            Patient Gender: M Patient Age:   28 years Exam Location:  [facility] Procedure:      VAS [place] LOWER EXTREMITY VENOUS (DVT) Referring Phys: Jon Billings Nil Bolser --------------------------------------------------------------------------------  Indications: Swelling, and Edema.  Comparison Study: Previous study on 12.12.2024. Performing Technologist: Fernande Bras  Examination Guidelines: A complete evaluation includes B-mode imaging, spectral Doppler, color Doppler, and power Doppler as needed of all accessible portions of each vessel. Bilateral testing is considered an integral part of a complete examination. Limited examinations for reoccurring indications may be performed as noted. The reflux portion of the exam is performed with the patient in reverse Trendelenburg.  +---------+---------------+---------+-----------+----------+-------------------+ RIGHT    CompressibilityPhasicitySpontaneityPropertiesThrombus Aging       +---------+---------------+---------+-----------+----------+-------------------+ CFV      Full           Yes      Yes                                      +---------+---------------+---------+-----------+----------+-------------------+ SFJ      Full           Yes      Yes                                      +---------+---------------+---------+-----------+----------+-------------------+ FV Prox  Full                                                             +---------+---------------+---------+-----------+----------+-------------------+ FV Mid   Full                                                             +---------+---------------+---------+-----------+----------+-------------------+ FV DistalFull                                                             +---------+---------------+---------+-----------+----------+-------------------+ PFV      Full                                                             +---------+---------------+---------+-----------+----------+-------------------+  POP      Full           Yes      Yes                                      +---------+---------------+---------+-----------+----------+-------------------+ PTV      Full                                                             +---------+---------------+---------+-----------+----------+-------------------+ PERO                                                  Limited, patent by                                                        color.              +---------+---------------+---------+-----------+----------+-------------------+   +----+---------------+---------+-----------+----------+--------------+ LEFTCompressibilityPhasicitySpontaneityPropertiesThrombus Aging +----+---------------+---------+-----------+----------+--------------+ CFV Full           Yes      Yes                                  +----+---------------+---------+-----------+----------+--------------+ SFJ Full           Yes      Yes                                 +----+---------------+---------+-----------+----------+--------------+    Summary: RIGHT: - There is no evidence of deep vein thrombosis in the lower extremity.  - No cystic structure found in the popliteal fossa.  LEFT: - No evidence of common femoral vein obstruction.   *See table(s) above for measurements and observations.    Preliminary        Alba Cory M.D. [facility] 11/01/2023, 4:39 PM  Available via Epic secure chat 7am-7pm After 7 pm, please refer to night coverage provider listed on amion.

## 2023-11-01 NOTE — Plan of Care (Signed)
  Problem: Clinical Measurements: Goal: Respiratory complications will improve Outcome: Not Progressing   Problem: Activity: Goal: Risk for activity intolerance will decrease Outcome: Not Progressing   Problem: Coping: Goal: Level of anxiety will decrease Outcome: Not Progressing   

## 2023-11-01 NOTE — Consult Note (Signed)
Dale [name] Psychiatry Consult Followup  Service Date: November 01, 2023 LOS:  LOS: 18 days    Primary Psychiatric Diagnoses  MDD, recurrent, severe 2.  Demoralization syndrome 3. EtOH abuse (severe), cocaine use (sustained remission) 4. PTSD  Assessment  Dale [name] is a 62 y.o. male admitted medically for 10/13/2023  6:57 PM for CHF exacerbation. He carries the psychiatric diagnoses of EtOH abuse (severe), cocaine abuse (sustained remission) and MDD and has a past medical history of CAD, CHF, CKD, HLD, HTN, cardiomyopathy and anemia. On initial consult, patient's symptoms appeared consistent with chronic MDD and demoralization syndrome and was started on mirtazapine and clonazepam. Antipsychotics were avoided due to prolonged QTc as well as mirtazapine being the least impactful on QTc.  Psychiatry was consulted by Dr. Isidoro Donning for re-evaluation of SI. He had made SI statements over the phone 10/29/23 resulting in confiscation of cutlery knives in patient's possession and sitter to be ordered.   ON followup the past 3 days patient is in good spirits noting he is sleeping and eating well. Future oriented on staying with a friend.  He was seen doing physical therapy. Patient endorses fleeting SI 3 days ago which has since resolved and reports chronic intermittent SI for the past 2 years. Patient is interested in outpatient psychiatric services and was interested in attending IOP program in person however insurance only accepted at virtual PHP through our referral network and this referral was place with first availability on 11/09/22.   He has some chronic risk factors including history of suicide attempts and male gender but has significantly more protective factors including spirituality, supportive family, cares for family, and future oriented. Overall patient is at low acute risk for suicide although struggles with chronic depressive and PTSD symptoms. Tolerating increase of Remeron and my  plan would be to increase to 30 mg in a week which can be completed on outpatient basis if patient is discharged.   Diagnoses:  Active Hospital problems: Principal Problem:   Acute on chronic HFrEF (heart failure with reduced ejection fraction) (HCC) Active Problems:   Acute kidney injury superimposed on chronic kidney disease (HCC)   CKD (chronic kidney disease), stage III (HCC)   Alcohol use disorder, moderate, in early remission (HCC)   Acute on chronic systolic CHF (congestive heart failure) (HCC)   Acute respiratory failure with hypoxia (HCC)   Hx of cocaine abuse (HCC)   Atherosclerosis of native coronary artery of native heart without angina pectoris   Cardiogenic shock (HCC)   Cardiorenal syndrome   Cardiac arrest (HCC)   Chronic post-traumatic stress disorder (PTSD)   MDD (major depressive disorder), recurrent severe, without psychosis (HCC)     Plan  ## Psychiatric Medication Recommendations:    -- cont mirtazapine 15 mg at bedtime for depression; recommend increase to 30 mg in 5 days if patient is still hospitalized -- klonopin 0.5 mg BID for depression and anxiety, recommended slow taper with patient given his PTSD diagnosis and benzodiazapine being a relative contraindication  ## Medical Decision Making Capacity:  Not formally assessed   ## Further Work-up:   -- Last qtc 447 and has been decreasing  While pt on Qtc prolonging medications, please monitor & replete K+ to 4 and Mg2+ to 2 -- Pertinent labwork reviewed earlier this admission includes: UDS + THC but negative for cocaine  ## Disposition:  -- Does not meet criteria for inpt psych  -- PHP referral  ## Behavioral / Environmental:  --  Linzie Collin  compassion and acknowledge the patient's experiences while setting clear and realistic expectations for care.    ## Safety and Observation Level:  - Based on my clinical evaluation, I estimate the patient to be at low risk of self harm in the current  setting - At this time, we recommend continuing a routine level of observation. This decision is based on my review of the chart including patient's history and current presentation, interview of the patient, mental status examination, and consideration of suicide risk including evaluating suicidal ideation, plan, intent, suicidal or self-harm behaviors, risk factors, and protective factors. This judgment is based on our ability to directly address suicide risk, implement suicide prevention strategies and develop a safety plan while the patient is in the clinical setting. Please contact our team if there is a concern that risk level has changed.  Suicide risk assessment  Patient has following modifiable risk factors for suicide: under treated depression  and lack of access to outpatient mental health resources, which we are addressing by addressing insomnia and brief supportive interactions; safest antidepressant available.   Patient has following non-modifiable or demographic risk factors for suicide: male gender and history of suicide attempt  Patient has the following protective factors against suicide: Supportive family and Cultural, spiritual, or religious beliefs that discourage suicide, future orientation   Thank you for this consult request. Recommendations have been communicated to the primary team.  Will sign off, please reconsult if any further concerns.  Miguel Rota, MD  Psychiatric and Social History   Relevant Aspects of Hospital Course:  Admitted on 10/13/2023 for CHF exacerbation. They have intermittently endorsed SI to staff as well as sporadic moments of refusing care. Has been switched to DNR. Palliative involved.   Per Dr. Betsey Holiday initial consult note His current presentation of worsening of chronic depressive symptoms and lack of faith things can get better with chronic medical condition is most consistent with known history of MDD and demoralization syndrome.He has had  minimal past medication trials. He consented to trial of Remeron after discussion of r/b/se incl cardiac s/e with primary goal of improving in-hospital sleep for (hopefully) rapid improvement of mood and anxiety sx to baseline. Remeron with some risk of qtc prolongation however less than other agents; holding off on oral antipsychotics despite complaint of hallucinations (also explained by delirium, lack of sleep, EtOH w/d). Pt on tele at time of initial consult and generally values align around quality of life. Some element of delirium (in ICU/likely EtOH w/d) - things seem to get much worse at night for him so leaving sitter in place. On initial examination, patient was quite depressed but able to name some things he is looking forward to. Has struggled with comprehending scope of medical illness throughout hospital course; making some statements about asking for a "suicide pill" and some about wanting to walk out of the hospital. Psych medication options limited in setting of advanced heart failure and prolonged qtc.   Since last consult by psychiatry, patient had transferred back to ICU due to respiratory distress with progression to respiratory arrest w/ hypoxia, bradycardia, vtach, and vfib. He was intubated but extubated 10/25/2023. He was made DNR 10/24/23 and transferred back to telemed 12/22.   Patient Report:  Patient evaluated at bedside. Patient denies SI/HI/AVH.  Patient was seen at bedside eating and watching TV. Patient notes his plan is to stay with a friend on discharge. Patient was thankful for [facility] referral and states he is tolerating medications well. Denies feeling hopeless  or helpless. Denies any suicidal thoughts the past 3 days. He reports that he had fleeting SI 3 days ago without intent or plan which was triggered by family not visiting on Christmas day after they promised to do so.  Reports good sleep and appetite and patient was seen participating in physical therapy.  Patient states  he looks forward to seeing his grandchildren.    Psych ROS on initial assessment:  Depression: Mostly anxious depressed picture - poor sleep, ruminative thoughts, always worried, poor concentration/appetite.  Anxiety:  tied into sx depression Mania (lifetime and current): denies - had manic sx while using cocaine, not since stopped Psychosis: (lifetime and current): hallucinations here in hospital  Collateral information:  Sincer Collie (daughter) @ [phone] Daughter states patient is doing better than before. She does express concern of relapse on alcohol or substances given psychosocial stressors and should immediately go home as compared to staying with family. She wants to make sure that patient does as well as he is able to given his current medical problems.  She denies any acute safety concerns at this time. All questions addressed.  Psychiatric History:  Information collected from pt, medical record  Prev Dx/Sx: multiple substance use d/o, depression, PTSD Current Psych Provider: none Home Meds (current): none Previous Med Trials: unknown Therapy: none  Prior ECT: no Prior Psych Hospitalization: it sounded like x1  Prior Self Harm: one attempt, hanging, a couple of years ago Prior Violence: not evaluated  Family Psych History: "they are all crazy" Family Hx suicide: uncles  Social History:  Divorced, married 19 years, 2 kids Developmental Hx: unknown Educational Hx: unknown Occupational Hx: disability for heart Legal Hx: unknown Living Situation: in a "drug den" Spiritual Hx: Spiritual Access to weapons: no  Substance History Tobacco use: no Alcohol use: yes, every day for several months, gets irritable, nervous, ugly when stops. Thinks he has had seizures but not sure. Helps sleep Drug use: marijuana daily    Exam Findings   Psychiatric Specialty Exam:  Mental Status Exam: General Appearance: Ill appearing  Orientation:  Full (Time, Place, and Person)   Memory:  Immediate;   Fair Recent;   Fair Remote;   Good  Concentration:  Concentration: Fair and Attention Span: Good  Recall:  Fair  Attention  Good  Eye Contact:  Good  Speech:  Clear and Coherent  Language:  Good  Volume:  Normal  Mood: "better"  Affect:  Congruent and Full Range  Thought Process:  Coherent and Goal Directed  Thought Content:  devoid of delusions/paranoia  Suicidal Thoughts:  No  Homicidal Thoughts:  No  Judgement:  Fair  Insight:  poor  Psychomotor Activity:  Normal  Akathisia:  No  Fund of Knowledge:  Fair      Assets:  Communication Skills Desire for Improvement    ADL's:  Impaired  AIMS (if indicated):   not indicated     Physical Exam: Vital signs:  Temp:  [97.6 F (36.4 C)-98 F (36.7 C)] 97.6 F (36.4 C) (12/28 1242) Pulse Rate:  [80-100] 80 (12/28 1242) Resp:  [20-22] 20 (12/28 1242) BP: (104-121)/(76-98) 104/76 (12/28 1242) SpO2:  [99 %-100 %] 99 % (12/28 1242) Physical Exam Vitals and nursing note reviewed.  Constitutional:      General: He is not in acute distress. HENT:     Head: Normocephalic and atraumatic.  Cardiovascular:     Rate and Rhythm: Normal rate.  Pulmonary:     Effort: No tachypnea or respiratory  distress.     Blood pressure 104/76, pulse 80, temperature 97.6 F (36.4 C), temperature source Oral, resp. rate 20, height 5\' 11"  (1.803 m), weight 107.9 kg, SpO2 99%. Body mass index is 33.18 kg/m.   Other History   These have been pulled in through the EMR, reviewed, and updated if appropriate.   Family History:  The patient's family history includes Hypertension in his maternal grandmother.  Medical History: Past Medical History:  Diagnosis Date   Alcohol abuse    CAD (coronary artery disease)    a. 05/2019 Cath: LM nl, LAD 15p, 42m, LCX large/nl, OM2 30, OM3 20, RCA 100 CTO w/ bridging L->R collats to RPDA.   Cardiorenal syndrome 10/22/2023   Chronic combined systolic and diastolic CHF (congestive  heart failure) (HCC)    a. 09/27/18 Echo: EF 20-25%, grade 2 DD; b. 03/2019 Echo: EF 20-25%; c. 05/2021 Echo: EF <10%, glob HK. GrII DD. Sev red RV fxn. Sev BAE. Mild MR. Mild-mod TR.   CKD (chronic kidney disease), stage II    Cocaine abuse (HCC)    Hyperlipidemia LDL goal <70    Hypertension    Ischemic cardiomyopathy    a. 09/27/18 Echo: EF 20-25%, grade 2 DD; b. 03/2019 Echo: EF 20-25%; c. 05/2019 Cath: RCA 100 CTA, otw nonobs dzs; d. 05/2021 Echo: EF <10%, glob HK. GrII DD. Sev red RV fxn.   Morbid obesity (HCC)    Noncompliance    Normocytic anemia     Surgical History: Past Surgical History:  Procedure Laterality Date   RIGHT/LEFT HEART CATH AND CORONARY ANGIOGRAPHY N/A 05/10/2019   Procedure: RIGHT/LEFT HEART CATH AND CORONARY ANGIOGRAPHY;  Surgeon: Yvonne Kendall, MD;  Location: [facility] INVASIVE CV LAB;  Service: Cardiovascular;  Laterality: N/A;   RIGHT/LEFT HEART CATH AND CORONARY ANGIOGRAPHY N/A 03/18/2022   Procedure: RIGHT/LEFT HEART CATH AND CORONARY ANGIOGRAPHY;  Surgeon: Laurey Morale, MD;  Location: [facility] INVASIVE CV LAB;  Service: Cardiovascular;  Laterality: N/A;    Medications:   Current Facility-Administered Medications:    allopurinol (ZYLOPRIM) tablet 100 mg, 100 mg, Oral, Daily, Regalado, Belkys A, MD, 100 mg at 11/01/23 1014   alum & mag hydroxide-simeth (MAALOX/MYLANTA) 200-200-20 MG/5ML suspension 15 mL, 15 mL, Oral, Q4H PRN, Rai, Ripudeep K, MD   amiodarone (PACERONE) tablet 200 mg, 200 mg, Oral, Daily, Rai, Ripudeep K, MD, 200 mg at 11/01/23 1016   aspirin chewable tablet 81 mg, 81 mg, Oral, Daily, Rai, Ripudeep K, MD, 81 mg at 11/01/23 1016   Chlorhexidine Gluconate Cloth 2 % PADS 6 each, 6 each, Topical, Q0600, Rai, Ripudeep K, MD, 6 each at 10/30/23 0649   clonazePAM (KLONOPIN) tablet 0.5 mg, 0.5 mg, Oral, BID, 0.5 mg at 11/01/23 1016 **AND** [DISCONTINUED] clonazePAM (KLONOPIN) tablet 1 mg, 1 mg, Oral, QHS, Mariel Craft, MD, 1 mg at 10/17/23 2132   colchicine  tablet 0.3 mg, 0.3 mg, Oral, Daily, Regalado, Belkys A, MD, 0.3 mg at 11/01/23 1013   melatonin tablet 5 mg, 5 mg, Oral, QHS PRN, Rai, Ripudeep K, MD, 5 mg at 10/31/23 2102   midodrine (PROAMATINE) tablet 15 mg, 15 mg, Oral, TID WC, Rai, Ripudeep K, MD, 15 mg at 11/01/23 1013   mirtazapine (REMERON SOL-TAB) disintegrating tablet 15 mg, 15 mg, Oral, QHS, Rasmus Preusser, MD, 15 mg at 10/31/23 2102   Oral care mouth rinse, 15 mL, Mouth Rinse, PRN, Rai, Ripudeep K, MD   Oral care mouth rinse, 15 mL, Mouth Rinse, PRN, Rai, Delene Ruffini, MD  oxyCODONE (Oxy IR/ROXICODONE) immediate release tablet 5 mg, 5 mg, Oral, Q4H PRN, Rai, Ripudeep K, MD, 5 mg at 10/31/23 1234   polyvinyl alcohol (LIQUIFILM TEARS) 1.4 % ophthalmic solution 1 drop, 1 drop, Both Eyes, PRN, Rai, Ripudeep K, MD   prochlorperazine (COMPAZINE) injection 10 mg, 10 mg, Intravenous, Q6H PRN, Rai, Ripudeep K, MD, 10 mg at 10/31/23 1817   torsemide (DEMADEX) tablet 40 mg, 40 mg, Oral, Daily, Rai, Ripudeep K, MD, 40 mg at 11/01/23 1013  Allergies: Allergies  Allergen Reactions   Hydrocodone Itching

## 2023-11-01 NOTE — Evaluation (Signed)
Physical Therapy Evaluation Patient Details Name: Tonio Beisler MRN: [id number] DOB: Apr 05, 1961 Today's Date: 11/01/2023   SATURATION QUALIFICATIONS: (This note is used to comply with regulatory documentation for home oxygen)  Patient Saturations on Room Air at Rest = 97%  Patient Saturations on Room Air while Ambulating = 92%        History of Present Illness  62 yo male admitted with acute on chronic HF, acute distress/hypoxia. Hx of CHF, polysubstance abuse, MDD, PTSD  Clinical Impression  On eval, pt was Supv level for mobility. He walked ~50 feet with a RW. Ambulation distance was limited by dyspnea, fatigue. Dyspnea 2/4. O2 92% on RA. Limited ambulation distance to tolerance on today (pt reports he walked the length of hallway on yesterday). He is concerned with continued edema in LEs which he reports is making it difficult for him to mobilize. Will plan to follow and progress activity as tolerated. Recommend TOC consult for living situation difficulties.       If plan is discharge home, recommend the following: A little help with walking and/or transfers;A little help with bathing/dressing/bathroom;Assistance with cooking/housework;Assist for transportation;Help with stairs or ramp for entrance   Can travel by private vehicle        Equipment Recommendations Rolling walker (2 wheels) (if pt doesn't still have one)  Recommendations for Other Services       Functional Status Assessment Patient has had a recent decline in their functional status and demonstrates the ability to make significant improvements in function in a reasonable and predictable amount of time.     Precautions / Restrictions Precautions Precautions: Fall Precaution Comments: monitor O2 Restrictions Weight Bearing Restrictions Per Provider Order: No      Mobility  Bed Mobility               General bed mobility comments: oob in recliner    Transfers Overall transfer level:  Modified independent Equipment used: Rolling walker (2 wheels)                    Ambulation/Gait Ambulation/Gait assistance: Supervision Gait Distance (Feet): 50 Feet Assistive device: Rolling walker (2 wheels) Gait Pattern/deviations: Step-through pattern, Decreased stride length       General Gait Details: Slow gait speed. Pt fatigues easily/quickly so limited ambulation distance on today. Pt reports he was able to walk the length of hallway on yesterday. O2 92% on RA  Stairs            Wheelchair Mobility     Tilt Bed    Modified Rankin (Stroke Patients Only)       Balance Overall balance assessment: Mild deficits observed, not formally tested                                           Pertinent Vitals/Pain Pain Assessment Pain Assessment: No/denies pain    Home Living Family/patient expects to be discharged to:: Shelter/Homeless Living Arrangements: Non-relatives/Friends                      Prior Function Prior Level of Function : Independent/Modified Independent             Mobility Comments: reports he was using a RW ADLs Comments: easier some days, harder other days, reports SOB makes things difficulty     Extremity/Trunk Assessment   Upper Extremity Assessment  Upper Extremity Assessment: Defer to OT evaluation    Lower Extremity Assessment Lower Extremity Assessment: Generalized weakness (LE edema R >L)    Cervical / Trunk Assessment Cervical / Trunk Assessment: Normal  Communication   Communication Communication: No apparent difficulties  Cognition Arousal: Alert Behavior During Therapy: WFL for tasks assessed/performed Overall Cognitive Status: Within Functional Limits for tasks assessed                                          General Comments      Exercises     Assessment/Plan    PT Assessment Patient needs continued PT services  PT Problem List Decreased  mobility;Decreased activity tolerance;Decreased balance       PT Treatment Interventions DME instruction;Gait training;Functional mobility training;Therapeutic activities;Therapeutic exercise;Patient/family education;Balance training    PT Goals (Current goals can be found in the Care Plan section)  Acute Rehab PT Goals Patient Stated Goal: to breathe better PT Goal Formulation: With patient Time For Goal Achievement: 11/15/23 Potential to Achieve Goals: Good    Frequency Min 1X/week     Co-evaluation               AM-PAC PT "6 Clicks" Mobility  Outcome Measure Help needed turning from your back to your side while in a flat bed without using bedrails?: None Help needed moving from lying on your back to sitting on the side of a flat bed without using bedrails?: None Help needed moving to and from a bed to a chair (including a wheelchair)?: None Help needed standing up from a chair using your arms (e.g., wheelchair or bedside chair)?: None Help needed to walk in hospital room?: A Little Help needed climbing 3-5 steps with a railing? : A Little 6 Click Score: 22    End of Session   Activity Tolerance: Patient limited by fatigue Patient left: in chair;with call bell/phone within reach        Time: 8295-6213 PT Time Calculation (min) (ACUTE ONLY): 15 min   Charges:   PT Evaluation $PT Eval Low Complexity: 1 Low   PT General Charges $$ ACUTE PT VISIT: 1 Visit            Faye Ramsay, PT Acute Rehabilitation  Office: [phone]

## 2023-11-01 NOTE — Progress Notes (Signed)
RN had to place pt on 1L oxygen due to sats being 86%

## 2023-11-01 NOTE — Progress Notes (Signed)
Wallet obtained from security and returned to pt per pt request so that he can pay for his grandmothers insulin.  Wallet contents and itemized list reviewed with pt and pt satisfied with belongings returned.  Security sheet signed by pt and returned to chart.  X2 knives remain in security possession, and can be returned to pt after discharge.  Pt states understanding.

## 2023-11-02 DIAGNOSIS — I5023 Acute on chronic systolic (congestive) heart failure: Secondary | ICD-10-CM | POA: Diagnosis not present

## 2023-11-02 LAB — BASIC METABOLIC PANEL
Anion gap: 12 (ref 5–15)
BUN: 51 mg/dL — ABNORMAL HIGH (ref 6–20)
CO2: 26 mmol/L (ref 22–32)
Calcium: 9.2 mg/dL (ref 8.9–10.3)
Chloride: 100 mmol/L (ref 98–111)
Creatinine, Ser: 1.91 mg/dL — ABNORMAL HIGH (ref 0.61–1.24)
GFR, Estimated: 40 mL/min — ABNORMAL LOW (ref >60)
Glucose, Bld: 140 mg/dL — ABNORMAL HIGH (ref 70–99)
Potassium: 3.8 mmol/L (ref 3.5–5.1)
Sodium: 138 mmol/L (ref 135–145)

## 2023-11-02 LAB — MAGNESIUM: Magnesium: 2.3 mg/dL (ref 1.7–2.4)

## 2023-11-02 LAB — PHOSPHORUS: Phosphorus: 3.6 mg/dL (ref 2.5–4.6)

## 2023-11-02 MED ORDER — ALLOPURINOL 100 MG PO TABS: 100.0000 mg | ORAL_TABLET | Freq: Every day | ORAL | 0 refills | Status: DC

## 2023-11-02 MED ORDER — MIRTAZAPINE 15 MG PO TBDP: 15.0000 mg | ORAL_TABLET | Freq: Every day | ORAL | 0 refills | Status: DC

## 2023-11-02 MED ORDER — AMIODARONE HCL 200 MG PO TABS: 200.0000 mg | ORAL_TABLET | Freq: Every day | ORAL | 3 refills | Status: DC

## 2023-11-02 MED ORDER — ONDANSETRON HCL 4 MG/2ML IJ SOLN
4.0000 mg | Freq: Once | INTRAMUSCULAR | Status: AC
  Administered 2023-11-02: 4 mg via INTRAVENOUS
  Filled 2023-11-02: qty 2

## 2023-11-02 MED ORDER — TORSEMIDE 20 MG PO TABS
20.0000 mg | ORAL_TABLET | Freq: Every day | ORAL | Status: DC
  Administered 2023-11-02: 20 mg via ORAL
  Filled 2023-11-02: qty 1

## 2023-11-02 MED ORDER — MIRTAZAPINE 15 MG PO TBDP: 15.0000 mg | ORAL_TABLET | Freq: Every day | ORAL | 0 refills | Status: AC

## 2023-11-02 MED ORDER — AMIODARONE HCL 200 MG PO TABS: 200.0000 mg | ORAL_TABLET | Freq: Every day | ORAL | 3 refills | Status: AC

## 2023-11-02 MED ORDER — MIDODRINE HCL 5 MG PO TABS: 15.0000 mg | ORAL_TABLET | Freq: Three times a day (TID) | ORAL | 3 refills | Status: AC

## 2023-11-02 MED ORDER — ALLOPURINOL 100 MG PO TABS: 100.0000 mg | ORAL_TABLET | Freq: Every day | ORAL | 0 refills | Status: AC

## 2023-11-02 MED ORDER — TORSEMIDE 20 MG PO TABS: 40.0000 mg | ORAL_TABLET | Freq: Every day | ORAL | 6 refills | Status: DC

## 2023-11-02 MED ORDER — CLONAZEPAM 0.5 MG PO TABS: 0.5000 mg | ORAL_TABLET | Freq: Two times a day (BID) | ORAL | 0 refills | Status: AC

## 2023-11-02 NOTE — Progress Notes (Signed)
Extremely complicated discharge. Returned all meds from pharmacy to patient (white sheets placed on chart) and new meds that pharmacy provided were sent with patient at discharge. Providing patient with cab voucher for discharge and walker at discharge. Security will return patients knives to him once he is in the taxi to go home.

## 2023-11-02 NOTE — Progress Notes (Signed)
At approximately 0500, a loud noise was noted from pts room.  Pt recliner had been noted to be tipped forward with foot rest up, with pt still in the chair, with only feet touching the ground, buttocks still in the chair, call bell in reach.  Pt stated he had been trying to put on his gripper socks and tipped the chair forward.  Ensured placement/function of chair alarm under pt, placed gripper socks on pt, and assisted pt to stand and adjust recliner.  Pt denies pain injury and no s/s injury noted.  Pt opted to sit back into the recliner - assisted to do so, call bell and urinal in reach, chair alarm on.  Warm blankets given per request.

## 2023-11-02 NOTE — TOC Transition Note (Addendum)
Transition of Care [facility]) - Discharge Note   Patient Details  Name: Dale [name] MRN: [id number] Date of Birth: 1960-11-19  Transition of Care [facility]) CM/SW Contact:  Darleene Cleaver, LCSW Phone Number: 11/02/2023, 10:52 AM   Clinical Narrative:     CSW was informed that patient will need oxygen for night time use.  He will also need a rolling walker.  CSW contacted [employer], they will deliver the oxygen to the address he is staying at, and the rolling walker will be delivered to the patient's room prior to discharge.  Patient will need a cab ride home via voucher, CSW provided cab voucher for patient to go to [address], [place] [address].  CSW signing off, please reconsult if other TOC needs arise.  Final next level of care: Home/Self Care Barriers to Discharge: Barriers Resolved   Patient Goals and CMS Choice Patient states their goals for this hospitalization and ongoing recovery are:: To return back home. CMS Medicare.gov Compare Post Acute Care list provided to:: Patient Choice offered to / list presented to : Patient [place] ownership interest in [facility].provided to:: Patient    Discharge Placement   Home with a friend.                Discharge Plan and Services Additional resources added to the After Visit Summary for     Discharge Planning Services: CM Consult Post Acute Care Choice: NA          DME Arranged: Walker rolling, Oxygen DME Agency: [employer] Date DME Agency Contacted: 11/02/23 Time DME Agency Contacted: 1052 Representative spoke with at DME Agency: Selena Batten HH Arranged: NA HH Agency: NA        Social Drivers of Health (SDOH) Interventions SDOH Screenings   Food Insecurity: Patient Unable To Answer (10/14/2023)  Recent Concern: Food Insecurity - Food Insecurity Present (07/21/2023)  Housing: Medium Risk (10/14/2023)  Transportation Needs: Unmet Transportation Needs (10/14/2023)  Utilities: Patient Unable To Answer  (10/14/2023)  Alcohol Screen: High Risk (02/20/2022)  Depression (PHQ2-9): Medium Risk (05/05/2019)  Financial Resource Strain: High Risk (08/25/2023)  Tobacco Use: Low Risk  (10/14/2023)     Readmission Risk Interventions    10/20/2023    3:32 PM 10/19/2023    2:46 PM 02/18/2023   12:36 PM  Readmission Risk Prevention Plan  Transportation Screening Complete Complete Complete  PCP or Specialist Appt within 3-5 Days   Complete  HRI or Home Care Consult   Complete  Social Work Consult for Recovery Care Planning/Counseling   Complete  Palliative Care Screening   Not Applicable  Medication Review [employer]) Complete  Complete  PCP or Specialist appointment within 3-5 days of discharge Complete --   HRI or Home Care Consult Complete Not Complete   HRI or Home Care Consult Pt Refusal Comments  Homeless   SW Recovery Care/Counseling Consult Complete Complete   Palliative Care Screening Complete    Skilled Nursing Facility Not Applicable

## 2023-11-02 NOTE — Discharge Summary (Signed)
Physician Discharge Summary   Patient: Dale [name] MRN: [id number] DOB: 1961/10/15  Admit date:     10/13/2023  Discharge date: 11/02/23  Discharge Physician: Alba Cory   PCP: Pcp, No   Recommendations at discharge:    Needs follow up with Cardiology for further care of Heart failure.   Discharge Diagnoses: Principal Problem:   Acute on chronic HFrEF (heart failure with reduced ejection fraction) (HCC) Active Problems:   Acute kidney injury superimposed on chronic kidney disease (HCC)   Acute respiratory failure with hypoxia (HCC)   Alcohol use disorder, moderate, in early remission (HCC)   CKD (chronic kidney disease), stage III (HCC)   Acute on chronic systolic CHF (congestive heart failure) (HCC)   Hx of cocaine abuse (HCC)   Atherosclerosis of native coronary artery of native heart without angina pectoris   Cardiogenic shock (HCC)   Cardiorenal syndrome   Cardiac arrest (HCC)   Chronic post-traumatic stress disorder (PTSD)   MDD (major depressive disorder), recurrent severe, without psychosis (HCC)  Resolved Problems:   * No resolved hospital problems. [facility] Course: Patient is a 62 year old male with end-stage HFrEF, prior cocaine use admitted 12/9 with acute systolic heart failure.  LVEF less than 20% with severe LV dilatation and RV dysfunction.  He was treated with inotropes and diuresed.  Renal function worsened.  Palliative discussions ongoing.  There were some concerns about patient's capacity for decision-making and suicidal ideation, psychiatry was consulted.  There were multiple occasions where patient was refusing care. On 12/20, he was transferred back to the ICU from the floor due to respiratory distress and progressed to respiratory arrest with hypoxia, bradycardia V. tach and V-fib.  Required epi x 2, CPR with ROSC.  Intubated.  PCCM consulted Significant Hospital Events:    12/20 V-fib cardiac arrest , made DNR , family initially opted for  comfort care but then reversed since he was improving 12/21 extubated 12/23 still on pressors. Off oxygen. Comfortable family wanting to support thru christmas  Pressors off.   TRH assumed care on 12/24 He has been clear by Psych for discharge.  Having right LE pain and edema. Improved at discharge.   Assessment and Plan: V-fib cardiac arrest in the setting of end-stage cardiomyopathy/CHF, hyperkalemia metabolic acidosis -Resolved after treatment of hyperkalemia. -Continue amiodarone 200 mg daily   Acute on chronic systolic heart failure-end-stage Acute respiratory failure with hypoxia -Initially placed on advanced heart failure therapies, transition from milrinone to digoxin and midodrine -Per CCM, no other options for advanced therapies due to substance abuse, psych issues and being unhoused.  He was placed on norepinephrine for BP support, weaned off on 10/27/2023 h -Placed on midodrine 15 mg 3 times daily, torsemide 40 mg daily, amiodarone 200 mg daily -Given on-going hypotension not candidate for GDMT -Goals of care has been discussed by CCM and hospice appropriate, no escalation of care.  If further deterioration, transition to comfort care. He was placed on oxygen last night, wake up with SOB suddenly.  Overnight pulse oxymetry done, patient was hypoxic oxygen down to 86 on Room air. Was placed on oxygen. He will be discharge on 2l oxygen.  Plan to discharge on 40 mg Torsemide. Needs labs to check renal function.        Severe MDD, PTSD, suicidal ideation History of polysubstance abuse -Psychiatry was consulted again for reevaluation for SI.  Patient made suicidal ideation statements over the phone yesterday 12/25 with plan using the knives.  The  cutlery knives were confiscated by security. -Patient was seen by psychiatry today, Dr Woodroe Mode,. Continue   Remeron.  Currently on Klonopin as well. -Clear by psych for discharge.    Acute kidney injury, cardiorenal syndrome,  hyperkalemia, superimposed on CKD stage IIIb -Baseline creatinine ~2.1 -Continue current management -Creatinine 1.7---1.9   Hypokalemia -Replaced   LE edema; Right doppler. No DVT Uric acid elevated./ started allopurinol.  Gave a dose of  colchicine.    Obesity type 1, needs life style modification.  Estimated body mass index is 33.18 kg/m as calculated from the following:   Height as of this encounter: 5\' 11"  (1.803 m).   Weight as of this encounter: 107.9 kg.       Consultants: Cardiology, CCM., Palliative care Procedures performed: None Disposition: Home Diet recommendation:  Discharge Diet Orders (From admission, onward)     Start     Ordered   11/02/23 0000  Diet - low sodium heart healthy        11/02/23 0917   10/22/23 0000  Diet - low sodium heart healthy        10/22/23 1700           Cardiac diet DISCHARGE MEDICATION: Allergies as of 11/02/2023       Reactions   Hydrocodone Itching        Medication List     STOP taking these medications    atorvastatin 40 MG tablet Commonly known as: LIPITOR   dapagliflozin propanediol 10 MG Tabs tablet Commonly known as: Farxiga   digoxin 0.125 MG tablet Commonly known as: LANOXIN   famotidine 20 MG tablet Commonly known as: PEPCID   metolazone 2.5 MG tablet Commonly known as: ZAROXOLYN   pantoprazole 40 MG tablet Commonly known as: PROTONIX   potassium chloride SA 20 MEQ tablet Commonly known as: KLOR-CON M   sildenafil 50 MG tablet Commonly known as: Viagra   spironolactone 25 MG tablet Commonly known as: ALDACTONE       TAKE these medications    allopurinol 100 MG tablet Commonly known as: ZYLOPRIM Take 1 tablet (100 mg total) by mouth daily.   amiodarone 200 MG tablet Commonly known as: PACERONE Take 1 tablet (200 mg total) by mouth daily.   aspirin EC 81 MG tablet Take 1 tablet (81 mg total) by mouth daily. Swallow whole.   clonazePAM 0.5 MG tablet Commonly known as:  KLONOPIN Take 1 tablet (0.5 mg total) by mouth 2 (two) times daily.   midodrine 5 MG tablet Commonly known as: PROAMATINE Take 3 tablets (15 mg total) by mouth 3 (three) times daily with meals.   mirtazapine 15 MG disintegrating tablet Commonly known as: REMERON SOL-TAB Take 1 tablet (15 mg total) by mouth at bedtime.   torsemide 20 MG tablet Commonly known as: DEMADEX Take 2 tablets (40 mg total) by mouth daily. What changed:  how much to take Another medication with the same name was removed. Continue taking this medication, and follow the directions you see here.               Durable Medical Equipment  (From admission, onward)           Start     Ordered   11/02/23 1041  For home use only DME Walker rolling  Once       Question Answer Comment  Walker: With 5 Inch Wheels   Patient needs a walker to treat with the following condition Balance disorder  11/02/23 1040   11/02/23 0748  For home use only DME oxygen  Once       Question Answer Comment  Length of Need 6 Months   Mode or (Route) Nasal cannula   Liters per Minute 2   Frequency Only at night (stationary unit needed)   Oxygen delivery system Gas      11/02/23 0747            Follow-up Information     [facility]. Schedule an appointment as soon as possible for a visit in 2 week(s).   Why: After DC please follow up with care provdier. Contact information: [address] [place] [address] [phone]        Laurey Morale, MD. Schedule an appointment as soon as possible for a visit in 2 week(s).   Specialty: Cardiology Why: for hospital follow-up Contact information: [address] [place] [address] [phone]                Discharge Exam: Ceasar Mons Weights   10/30/23 0230 10/30/23 1540 11/02/23 0800  Weight: 107.5 kg 107.9 kg 112 kg   General; NAD  Condition at discharge: stable  The results of  significant diagnostics from this hospitalization (including imaging, microbiology, ancillary and laboratory) are listed below for reference.   Imaging Studies: VAS [place] LOWER EXTREMITY VENOUS (DVT) Result Date: 11/02/2023  Lower Venous DVT Study Patient Name:  YADON GROENEWOLD  Date of Exam:   11/01/2023 Medical Rec #: [id number]            Accession #:    [id number] Date of Birth: 31-Jan-1961            Patient Gender: M Patient Age:   67 years Exam Location:  [facility] Procedure:      VAS [place] LOWER EXTREMITY VENOUS (DVT) Referring Phys: Jon Billings Dontavis Tschantz --------------------------------------------------------------------------------  Indications: Swelling, and Edema.  Comparison Study: Previous study on 12.12.2024. Performing Technologist: Fernande Bras  Examination Guidelines: A complete evaluation includes B-mode imaging, spectral Doppler, color Doppler, and power Doppler as needed of all accessible portions of each vessel. Bilateral testing is considered an integral part of a complete examination. Limited examinations for reoccurring indications may be performed as noted. The reflux portion of the exam is performed with the patient in reverse Trendelenburg.  +---------+---------------+---------+-----------+----------+-------------------+ RIGHT    CompressibilityPhasicitySpontaneityPropertiesThrombus Aging      +---------+---------------+---------+-----------+----------+-------------------+ CFV      Full           Yes      Yes                                      +---------+---------------+---------+-----------+----------+-------------------+ SFJ      Full           Yes      Yes                                      +---------+---------------+---------+-----------+----------+-------------------+ FV Prox  Full                                                             +---------+---------------+---------+-----------+----------+-------------------+  FV Mid   Full                                                              +---------+---------------+---------+-----------+----------+-------------------+ FV DistalFull                                                             +---------+---------------+---------+-----------+----------+-------------------+ PFV      Full                                                             +---------+---------------+---------+-----------+----------+-------------------+ POP      Full           Yes      Yes                                      +---------+---------------+---------+-----------+----------+-------------------+ PTV      Full                                                             +---------+---------------+---------+-----------+----------+-------------------+ PERO                                                  Limited, patent by                                                        color.              +---------+---------------+---------+-----------+----------+-------------------+   +----+---------------+---------+-----------+----------+--------------+ LEFTCompressibilityPhasicitySpontaneityPropertiesThrombus Aging +----+---------------+---------+-----------+----------+--------------+ CFV Full           Yes      Yes                                 +----+---------------+---------+-----------+----------+--------------+ SFJ Full           Yes      Yes                                 +----+---------------+---------+-----------+----------+--------------+    Summary: RIGHT: - There is no evidence of deep vein thrombosis in the lower extremity.  - No cystic structure found in the popliteal fossa.  LEFT: - No evidence of common femoral vein obstruction.   *  See table(s) above for measurements and observations. Electronically signed by Heath Lark on 11/02/2023 at 12:29:45 PM.    Final    DG Abd 1 View Result Date: 10/24/2023 CLINICAL DATA:  Nasogastric tube  placement. EXAM: ABDOMEN - 1 VIEW COMPARISON:  Yesterday FINDINGS: Enteric tube with tip at the stomach and side port near the GE junction. No gas dilated bowel. Clear lung bases. Endotracheal tube with tip between the clavicular heads and carina. IMPRESSION: Enteric tube with tip at the stomach and side port near the GE junction. Electronically Signed   By: Tiburcio Pea M.D.   On: 10/24/2023 07:29   DG Chest Port 1 View Result Date: 10/24/2023 CLINICAL DATA:  Acute respiratory failure EXAM: PORTABLE CHEST 1 VIEW COMPARISON:  Earlier today FINDINGS: Endotracheal tube with tip 2.5 cm above the carina. An enteric tube reaches the stomach. Poor visualization of retrocardiac structures due to the degree of cardiomegaly and underpenetration. Congested appearance of central vessels. No edema, visible effusion, or pneumothorax. Artifact from support hardware. IMPRESSION: Unremarkable hardware as described. Cardiomegaly with vascular congestion. Electronically Signed   By: Tiburcio Pea M.D.   On: 10/24/2023 07:21   DG Chest Port 1 View Result Date: 10/24/2023 CLINICAL DATA:  Shortness of breath EXAM: PORTABLE CHEST 1 VIEW COMPARISON:  Yesterday FINDINGS: Under penetrated study, no convincing interval infiltrate. Chronic cardiomegaly with mediastinal distortion by leftward rotation. No Kerley lines, visible effusion, or pneumothorax. IMPRESSION: Stable aeration and cardiomegaly.  No interval finding. Electronically Signed   By: Tiburcio Pea M.D.   On: 10/24/2023 05:03   DG Abd Portable 1V Result Date: 10/23/2023 CLINICAL DATA:  Nausea and vomiting. EXAM: PORTABLE ABDOMEN - 1 VIEW COMPARISON:  X-ray 10/21/2023. FINDINGS: Gas seen in nondilated loops of small and large bowel. Scattered colonic stool diffusely. Bowel gas is decreased from previous. No frank obstruction. No obvious free air on this portable supine radiograph. Degenerative changes of the spine and pelvis. Vascular calcifications. IMPRESSION:  Nonspecific bowel gas pattern.  Scattered stool. Electronically Signed   By: Karen Kays M.D.   On: 10/23/2023 15:58   DG CHEST PORT 1 VIEW Result Date: 10/23/2023 CLINICAL DATA:  Intractable nausea and vomiting EXAM: PORTABLE CHEST 1 VIEW COMPARISON:  Chest x-ray 10/13/2023 and older. FINDINGS: Enlarged cardiopericardial silhouette. Slight prominence of the central vasculature. No consolidation, pneumothorax or effusion. No edema. Film is under penetrated. IMPRESSION: Enlarged cardiopericardial silhouette with some slight vascular congestion. Electronically Signed   By: Karen Kays M.D.   On: 10/23/2023 15:55   DG Abd Portable 1V Result Date: 10/21/2023 CLINICAL DATA:  [id number] Nausea and vomiting [id number] EXAM: PORTABLE ABDOMEN - 1 VIEW COMPARISON:  06/13/2023 FINDINGS: The bowel gas pattern is normal. No radio-opaque calculi or other significant radiographic abnormality are seen. Degenerative changes of both hips, left worse than right. IMPRESSION: Negative. Electronically Signed   By: Duanne Guess D.O.   On: 10/21/2023 19:33   VAS [place] LOWER EXTREMITY VENOUS (DVT) Result Date: 10/16/2023  Lower Venous DVT Study Patient Name:  CELEDONIO LOUP  Date of Exam:   10/16/2023 Medical Rec #: [id number]            Accession #:    [id number] Date of Birth: [birth year]            Patient Gender: M Patient Age:   80 years Exam Location:  [facility] Procedure:      VAS [place] LOWER EXTREMITY VENOUS (DVT) Referring Phys: PRANAV PATEL --------------------------------------------------------------------------------  Indications: Edema.  Risk Factors: None identified. Limitations: Poor ultrasound/tissue interface. Comparison Study: No prior studies. Performing Technologist: Chanda Busing RVT  Examination Guidelines: A complete evaluation includes B-mode imaging, spectral Doppler, color Doppler, and power Doppler as needed of all accessible portions of each vessel. Bilateral testing is considered an integral  part of a complete examination. Limited examinations for reoccurring indications may be performed as noted. The reflux portion of the exam is performed with the patient in reverse Trendelenburg.  +---------+---------------+---------+-----------+----------+--------------+ RIGHT    CompressibilityPhasicitySpontaneityPropertiesThrombus Aging +---------+---------------+---------+-----------+----------+--------------+ CFV      Full           Yes      Yes                                 +---------+---------------+---------+-----------+----------+--------------+ SFJ      Full                                                        +---------+---------------+---------+-----------+----------+--------------+ FV Prox  Full                                                        +---------+---------------+---------+-----------+----------+--------------+ FV Mid   Full                                                        +---------+---------------+---------+-----------+----------+--------------+ FV DistalFull                                                        +---------+---------------+---------+-----------+----------+--------------+ PFV      Full                                                        +---------+---------------+---------+-----------+----------+--------------+ POP      Full           Yes      Yes                                 +---------+---------------+---------+-----------+----------+--------------+ PTV      Full                                                        +---------+---------------+---------+-----------+----------+--------------+ PERO     Full                                                        +---------+---------------+---------+-----------+----------+--------------+   +---------+---------------+---------+-----------+----------+-------------------+  LEFT     CompressibilityPhasicitySpontaneityPropertiesThrombus Aging       +---------+---------------+---------+-----------+----------+-------------------+ CFV      Full           Yes      Yes                                      +---------+---------------+---------+-----------+----------+-------------------+ SFJ      Full                                                             +---------+---------------+---------+-----------+----------+-------------------+ FV Prox  Full                                                             +---------+---------------+---------+-----------+----------+-------------------+ FV Mid   Full                                                             +---------+---------------+---------+-----------+----------+-------------------+ FV DistalFull           Yes      Yes                                      +---------+---------------+---------+-----------+----------+-------------------+ PFV      Full                                                             +---------+---------------+---------+-----------+----------+-------------------+ POP      Full           Yes      Yes                                      +---------+---------------+---------+-----------+----------+-------------------+ PTV      Full                                                             +---------+---------------+---------+-----------+----------+-------------------+ PERO                                                  Not well visualized +---------+---------------+---------+-----------+----------+-------------------+     Summary: RIGHT: - There is no evidence of deep vein thrombosis in  the lower extremity.  - No cystic structure found in the popliteal fossa.  LEFT: - There is no evidence of deep vein thrombosis in the lower extremity. However, portions of this examination were limited- see technologist comments above.  - No cystic structure found in the popliteal fossa.  *See table(s) above for measurements and  observations. Electronically signed by Carolynn Sayers on 10/16/2023 at 1:50:05 PM.    Final    ECHOCARDIOGRAM COMPLETE Result Date: 10/16/2023    ECHOCARDIOGRAM REPORT   Patient Name:   CLERANCE UZZLE Date of Exam: 10/16/2023 Medical Rec #:  [id number]           Height:       71.0 in Accession #:    [id number]          Weight:       233.9 lb Date of Birth:  1961-01-11           BSA:          2.254 m Patient Age:    62 years            BP:           153/119 mmHg Patient Gender: M                   HR:           91 bpm. Exam Location:  Inpatient Procedure: 2D Echo, Cardiac Doppler and Color Doppler Indications:    CHF I50.21  History:        Patient has prior history of Echocardiogram examinations, most                 recent 02/19/2022. Ischemic CMO, CAD; Risk Factors:Hypertension                 and Dyslipidemia.  Sonographer:    Harriette Bouillon RDCS Referring Phys: [id number] DALTON S MCLEAN IMPRESSIONS  1. Left ventricular ejection fraction, by estimation, is <20%. The left ventricle has severely decreased function. The left ventricle demonstrates global hypokinesis. The left ventricular internal cavity size was severely dilated. Left ventricular diastolic parameters are consistent with Grade II diastolic dysfunction (pseudonormalization).  2. Right ventricular systolic function is moderately reduced. The right ventricular size is severely enlarged. There is normal pulmonary artery systolic pressure.  3. Left atrial size was severely dilated.  4. Right atrial size was severely dilated.  5. The mitral valve is normal in structure. Mild mitral valve regurgitation. No evidence of mitral stenosis.  6. The aortic valve is tricuspid. Aortic valve regurgitation is trivial. No aortic stenosis is present.  7. The inferior vena cava is dilated in size with >50% respiratory variability, suggesting right atrial pressure of 8 mmHg. Comparison(s): Changes from prior study are noted. Both RV and LV have dilated further compared to  prior studies. Mild functional MR and TR. FINDINGS  Left Ventricle: Left ventricular ejection fraction, by estimation, is <20%. The left ventricle has severely decreased function. The left ventricle demonstrates global hypokinesis. The left ventricular internal cavity size was severely dilated. There is no left ventricular hypertrophy. Left ventricular diastolic parameters are consistent with Grade II diastolic dysfunction (pseudonormalization). Right Ventricle: The right ventricular size is severely enlarged. No increase in right ventricular wall thickness. Right ventricular systolic function is moderately reduced. There is normal pulmonary artery systolic pressure. The tricuspid regurgitant velocity is 2.24 m/s, and with an assumed right atrial pressure of 8 mmHg, the estimated right ventricular systolic pressure is 28.1 mmHg.  Left Atrium: Left atrial size was severely dilated. Right Atrium: Right atrial size was severely dilated. Pericardium: There is no evidence of pericardial effusion. Mitral Valve: The mitral valve is normal in structure. Mild mitral valve regurgitation. No evidence of mitral valve stenosis. Tricuspid Valve: The tricuspid valve is normal in structure. Tricuspid valve regurgitation is mild . No evidence of tricuspid stenosis. Aortic Valve: The aortic valve is tricuspid. Aortic valve regurgitation is trivial. No aortic stenosis is present. Pulmonic Valve: The pulmonic valve was grossly normal. Pulmonic valve regurgitation is trivial. No evidence of pulmonic stenosis. Aorta: The aortic root, ascending aorta, aortic arch and descending aorta are all structurally normal, with no evidence of dilitation or obstruction. Venous: The inferior vena cava is dilated in size with greater than 50% respiratory variability, suggesting right atrial pressure of 8 mmHg. IAS/Shunts: The atrial septum is grossly normal.  LEFT VENTRICLE PLAX 2D LVIDd:         9.30 cm      Diastology LVIDs:         8.60 cm      LV e'  medial:    4.13 cm/s LV PW:         0.90 cm      LV E/e' medial:  23.0 LV IVS:        0.80 cm      LV e' lateral:   5.87 cm/s LVOT diam:     2.30 cm      LV E/e' lateral: 16.2 LV SV:         54 LV SV Index:   24 LVOT Area:     4.15 cm  LV Volumes (MOD) LV vol d, MOD A2C: 519.0 ml LV vol d, MOD A4C: 401.0 ml LV vol s, MOD A2C: 406.0 ml LV vol s, MOD A4C: 317.0 ml LV SV MOD A2C:     113.0 ml LV SV MOD A4C:     401.0 ml LV SV MOD BP:      106.6 ml RIGHT VENTRICLE            IVC RV Basal diam:  5.41 cm    IVC diam: 2.30 cm RV S prime:     7.51 cm/s TAPSE (M-mode): 1.2 cm LEFT ATRIUM            Index LA diam:      5.80 cm  2.57 cm/m LA Vol (A4C): 152.0 ml 67.45 ml/m  AORTIC VALVE LVOT Vmax:   84.40 cm/s LVOT Vmean:  55.800 cm/s LVOT VTI:    0.131 m  AORTA Ao Root diam: 3.50 cm MITRAL VALVE               TRICUSPID VALVE MV Area (PHT): 7.74 cm    TR Peak grad:   20.1 mmHg MV Decel Time: 98 msec     TR Vmax:        224.00 cm/s MV E velocity: 95.10 cm/s MV A velocity: 58.60 cm/s  SHUNTS MV E/A ratio:  1.62        Systemic VTI:  0.13 m                            Systemic Diam: 2.30 cm Jodelle Red MD Electronically signed by Jodelle Red MD Signature Date/Time: 10/16/2023/11:51:58 AM    Final    [place] EKG SITE RITE Result Date: 10/15/2023 If Site Rite image not attached, placement could not be confirmed due to  current cardiac rhythm.  [place] EKG SITE RITE Result Date: 10/14/2023 If Site Rite image not attached, placement could not be confirmed due to current cardiac rhythm.  DG Chest 2 View Result Date: 10/13/2023 CLINICAL DATA:  Shortness of breath. EXAM: CHEST - 2 VIEW COMPARISON:  Chest radiograph dated 10/10/2023. FINDINGS: Cardiomegaly with mild central vascular congestion. No focal consolidation, pleural effusion, or pneumothorax. No acute osseous pathology. IMPRESSION: Cardiomegaly with mild central vascular congestion. Electronically Signed   By: Elgie Collard M.D.   On: 10/13/2023 19:39    DG Chest 2 View Result Date: 10/10/2023 CLINICAL DATA:  Shortness of breath.  Orthopnea. EXAM: CHEST - 2 VIEW COMPARISON:  02/16/2023 FINDINGS: Prominent diffuse cardiac enlargement. Mild central vascular congestion. No airspace disease, edema, or consolidation. No pleural effusions. No pneumothorax. No significant change. IMPRESSION: Diffuse cardiac enlargement similar to prior study. Mild central vascular congestion. No consolidation or edema in the lungs. Electronically Signed   By: Burman Nieves M.D.   On: 10/10/2023 03:22    Microbiology: Results for orders placed or performed during the hospital encounter of 10/13/23  MRSA Next Gen by PCR, Nasal     Status: None   Collection Time: 10/14/23  7:11 AM   Specimen: Nasal Mucosa; Nasal Swab  Result Value Ref Range Status   MRSA by PCR Next Gen NOT DETECTED NOT DETECTED Final    Comment: (NOTE) The GeneXpert MRSA Assay (FDA approved for NASAL specimens only), is one component of a comprehensive MRSA colonization surveillance program. It is not intended to diagnose MRSA infection nor to guide or monitor treatment for MRSA infections. Test performance is not FDA approved in patients less than 60 years old. Performed at [facility], [address], [place], [place] [address]     Labs: CBC: Recent Labs  Lab 10/27/23 [phone] 10/29/23 0638  WBC 8.8 6.4  NEUTROABS 6.3  --   HGB 12.0* 11.8*  HCT 36.0* 36.9*  MCV 90.9 92.7  PLT 217 234   Basic Metabolic Panel: Recent Labs  Lab 10/27/23 0312 10/29/23 0638 10/30/23 0253 10/31/23 0855 11/02/23 0354  NA 136 137 138 138 138  K 3.7 3.4* 3.5 3.7 3.8  CL 100 102 103 101 100  CO2 26 26 27 24 26   GLUCOSE 118* 89 110* 103* 140*  BUN 57* 41* 40* 41* 51*  CREATININE 2.34* 1.82* 1.70* 1.62* 1.91*  CALCIUM 8.3* 8.6* 8.7* 9.3 9.2  MG  --   --   --  2.2 2.3  PHOS  --   --   --   --  3.6   Liver Function Tests: No results for input(s): "AST", "ALT", "ALKPHOS",  "BILITOT", "PROT", "ALBUMIN" in the last 168 hours. CBG: No results for input(s): "GLUCAP" in the last 168 hours.  Discharge time spent: greater than 30 minutes.  Signed: Alba Cory, MD [facility] 11/02/2023

## 2023-11-02 NOTE — Plan of Care (Signed)
  Problem: Education: Goal: Knowledge of General Education information will improve Description: Including pain rating scale, medication(s)/side effects and non-pharmacologic comfort measures Outcome: Progressing   Problem: Health Behavior/Discharge Planning: Goal: Ability to manage health-related needs will improve Outcome: Progressing   Problem: Clinical Measurements: Goal: Ability to maintain clinical measurements within normal limits will improve Outcome: Progressing Goal: Will remain free from infection Outcome: Progressing Goal: Diagnostic test results will improve Outcome: Progressing Goal: Respiratory complications will improve Outcome: Progressing Goal: Cardiovascular complication will be avoided Outcome: Progressing   Problem: Activity: Goal: Risk for activity intolerance will decrease Outcome: Progressing   Problem: Nutrition: Goal: Adequate nutrition will be maintained Outcome: Progressing   Problem: Coping: Goal: Level of anxiety will decrease Outcome: Progressing   Problem: Elimination: Goal: Will not experience complications related to bowel motility Outcome: Progressing Goal: Will not experience complications related to urinary retention Outcome: Progressing   Problem: Pain Management: Goal: General experience of comfort will improve Outcome: Progressing   Problem: Safety: Goal: Ability to remain free from injury will improve Outcome: Progressing   Problem: Skin Integrity: Goal: Risk for impaired skin integrity will decrease Outcome: Progressing   Problem: Education: Goal: Ability to describe self-care measures that may prevent or decrease complications (Diabetes Survival Skills Education) will improve Outcome: Progressing Goal: Individualized Educational Video(s) Outcome: Progressing   Problem: Coping: Goal: Ability to adjust to condition or change in health will improve Outcome: Progressing   Problem: Fluid Volume: Goal: Ability to  maintain a balanced intake and output will improve Outcome: Progressing   Problem: Health Behavior/Discharge Planning: Goal: Ability to identify and utilize available resources and services will improve Outcome: Progressing Goal: Ability to manage health-related needs will improve Outcome: Progressing   Problem: Metabolic: Goal: Ability to maintain appropriate glucose levels will improve Outcome: Progressing   Problem: Nutritional: Goal: Maintenance of adequate nutrition will improve Outcome: Progressing Goal: Progress toward achieving an optimal weight will improve Outcome: Progressing   Problem: Skin Integrity: Goal: Risk for impaired skin integrity will decrease Outcome: Progressing   Problem: Tissue Perfusion: Goal: Adequacy of tissue perfusion will improve Outcome: Progressing   Problem: Activity: Goal: Ability to tolerate increased activity will improve Outcome: Progressing   Problem: Respiratory: Goal: Ability to maintain a clear airway and adequate ventilation will improve Outcome: Progressing   Problem: Role Relationship: Goal: Method of communication will improve Outcome: Progressing

## 2023-11-02 NOTE — Plan of Care (Signed)
  Problem: Education: Goal: Knowledge of General Education information will improve Description: Including pain rating scale, medication(s)/side effects and non-pharmacologic comfort measures Outcome: Adequate for Discharge   Problem: Health Behavior/Discharge Planning: Goal: Ability to manage health-related needs will improve Outcome: Adequate for Discharge   Problem: Clinical Measurements: Goal: Ability to maintain clinical measurements within normal limits will improve Outcome: Adequate for Discharge Goal: Will remain free from infection Outcome: Adequate for Discharge Goal: Diagnostic test results will improve Outcome: Adequate for Discharge Goal: Respiratory complications will improve Outcome: Adequate for Discharge Goal: Cardiovascular complication will be avoided Outcome: Adequate for Discharge   Problem: Activity: Goal: Risk for activity intolerance will decrease Outcome: Adequate for Discharge   Problem: Nutrition: Goal: Adequate nutrition will be maintained Outcome: Adequate for Discharge   Problem: Coping: Goal: Level of anxiety will decrease Outcome: Adequate for Discharge   Problem: Elimination: Goal: Will not experience complications related to bowel motility Outcome: Adequate for Discharge Goal: Will not experience complications related to urinary retention Outcome: Adequate for Discharge   Problem: Pain Management: Goal: General experience of comfort will improve Outcome: Adequate for Discharge   Problem: Safety: Goal: Ability to remain free from injury will improve Outcome: Adequate for Discharge   Problem: Skin Integrity: Goal: Risk for impaired skin integrity will decrease Outcome: Adequate for Discharge   Problem: Education: Goal: Ability to describe self-care measures that may prevent or decrease complications (Diabetes Survival Skills Education) will improve Outcome: Adequate for Discharge Goal: Individualized Educational Video(s) Outcome:  Adequate for Discharge   Problem: Coping: Goal: Ability to adjust to condition or change in health will improve Outcome: Adequate for Discharge   Problem: Fluid Volume: Goal: Ability to maintain a balanced intake and output will improve Outcome: Adequate for Discharge   Problem: Health Behavior/Discharge Planning: Goal: Ability to identify and utilize available resources and services will improve Outcome: Adequate for Discharge Goal: Ability to manage health-related needs will improve Outcome: Adequate for Discharge   Problem: Metabolic: Goal: Ability to maintain appropriate glucose levels will improve Outcome: Adequate for Discharge   Problem: Nutritional: Goal: Maintenance of adequate nutrition will improve Outcome: Adequate for Discharge Goal: Progress toward achieving an optimal weight will improve Outcome: Adequate for Discharge   Problem: Skin Integrity: Goal: Risk for impaired skin integrity will decrease Outcome: Adequate for Discharge   Problem: Tissue Perfusion: Goal: Adequacy of tissue perfusion will improve Outcome: Adequate for Discharge   Problem: Activity: Goal: Ability to tolerate increased activity will improve Outcome: Adequate for Discharge   Problem: Respiratory: Goal: Ability to maintain a clear airway and adequate ventilation will improve Outcome: Adequate for Discharge   Problem: Role Relationship: Goal: Method of communication will improve Outcome: Adequate for Discharge

## 2023-11-03 ENCOUNTER — Telehealth (HOSPITAL_COMMUNITY): Payer: Self-pay

## 2023-11-03 ENCOUNTER — Telehealth (HOSPITAL_COMMUNITY): Payer: Self-pay | Admitting: Licensed Clinical Social Worker

## 2023-11-03 NOTE — Telephone Encounter (Signed)
Called to confirm/remind patient of their appointment at the [facility] on 11/04/23.However, was unable to leave a voice message.

## 2023-11-04 ENCOUNTER — Encounter (HOSPITAL_COMMUNITY): Payer: MEDICAID

## 2023-11-04 ENCOUNTER — Telehealth (HOSPITAL_COMMUNITY): Payer: Self-pay | Admitting: Licensed Clinical Social Worker

## 2023-11-04 NOTE — Telephone Encounter (Signed)
 H&V Care Navigation CSW Progress Note  Clinical Social Worker informed patient rescheduled appt for today due to transportation barriers.  CSW provided number to set up ride through [place].  Patient also reports he has not gotten shipment from pharmacy of his meds- CSW provided with number to call pharmacy to inquire.   SDOH Screenings   Food Insecurity: Patient Unable To Answer (10/14/2023)  Recent Concern: Food Insecurity - Food Insecurity Present (07/21/2023)  Housing: Medium Risk (10/14/2023)  Transportation Needs: Unmet Transportation Needs (10/14/2023)  Utilities: Patient Unable To Answer (10/14/2023)  Alcohol  Screen: High Risk (02/20/2022)  Depression (PHQ2-9): Medium Risk (05/05/2019)  Financial Resource Strain: High Risk (08/25/2023)  Tobacco Use: Low Risk  (10/14/2023)   Andriette HILARIO Leech, LCSW Clinical Social Worker [facility] Desk#: [phone] Cell#: [phone]

## 2023-11-07 ENCOUNTER — Other Ambulatory Visit: Payer: Self-pay

## 2023-11-07 ENCOUNTER — Emergency Department (HOSPITAL_COMMUNITY): Payer: MEDICAID

## 2023-11-07 ENCOUNTER — Inpatient Hospital Stay (HOSPITAL_COMMUNITY)
Admission: EM | Admit: 2023-11-07 | Discharge: 2023-11-14 | DRG: 193 | Disposition: A | Discharge status: Home / Self Care | Payer: MEDICAID | Attending: Internal Medicine | Admitting: Internal Medicine

## 2023-11-07 ENCOUNTER — Encounter (HOSPITAL_COMMUNITY): Payer: Self-pay | Admitting: Internal Medicine

## 2023-11-07 DIAGNOSIS — F1021 Alcohol dependence, in remission: Secondary | ICD-10-CM | POA: Diagnosis present

## 2023-11-07 DIAGNOSIS — I4729 Other ventricular tachycardia: Secondary | ICD-10-CM | POA: Diagnosis present

## 2023-11-07 DIAGNOSIS — I255 Ischemic cardiomyopathy: Secondary | ICD-10-CM | POA: Diagnosis present

## 2023-11-07 DIAGNOSIS — F1410 Cocaine abuse, uncomplicated: Secondary | ICD-10-CM | POA: Diagnosis present

## 2023-11-07 DIAGNOSIS — D631 Anemia in chronic kidney disease: Secondary | ICD-10-CM | POA: Diagnosis present

## 2023-11-07 DIAGNOSIS — Z5982 Transportation insecurity: Secondary | ICD-10-CM

## 2023-11-07 DIAGNOSIS — E782 Mixed hyperlipidemia: Secondary | ICD-10-CM | POA: Diagnosis not present

## 2023-11-07 DIAGNOSIS — Z8249 Family history of ischemic heart disease and other diseases of the circulatory system: Secondary | ICD-10-CM

## 2023-11-07 DIAGNOSIS — I5043 Acute on chronic combined systolic (congestive) and diastolic (congestive) heart failure: Secondary | ICD-10-CM | POA: Diagnosis present

## 2023-11-07 DIAGNOSIS — I5084 End stage heart failure: Secondary | ICD-10-CM | POA: Diagnosis present

## 2023-11-07 DIAGNOSIS — Z79899 Other long term (current) drug therapy: Secondary | ICD-10-CM

## 2023-11-07 DIAGNOSIS — Z7189 Other specified counseling: Secondary | ICD-10-CM

## 2023-11-07 DIAGNOSIS — I959 Hypotension, unspecified: Secondary | ICD-10-CM | POA: Diagnosis not present

## 2023-11-07 DIAGNOSIS — Z716 Tobacco abuse counseling: Secondary | ICD-10-CM

## 2023-11-07 DIAGNOSIS — Z7982 Long term (current) use of aspirin: Secondary | ICD-10-CM

## 2023-11-07 DIAGNOSIS — E872 Acidosis, unspecified: Secondary | ICD-10-CM | POA: Diagnosis present

## 2023-11-07 DIAGNOSIS — J189 Pneumonia, unspecified organism: Principal | ICD-10-CM

## 2023-11-07 DIAGNOSIS — I509 Heart failure, unspecified: Secondary | ICD-10-CM | POA: Diagnosis not present

## 2023-11-07 DIAGNOSIS — Y95 Nosocomial condition: Secondary | ICD-10-CM | POA: Diagnosis present

## 2023-11-07 DIAGNOSIS — D649 Anemia, unspecified: Secondary | ICD-10-CM | POA: Diagnosis present

## 2023-11-07 DIAGNOSIS — Z1152 Encounter for screening for COVID-19: Secondary | ICD-10-CM | POA: Diagnosis not present

## 2023-11-07 DIAGNOSIS — Z5900 Homelessness unspecified: Secondary | ICD-10-CM

## 2023-11-07 DIAGNOSIS — F332 Major depressive disorder, recurrent severe without psychotic features: Secondary | ICD-10-CM | POA: Diagnosis present

## 2023-11-07 DIAGNOSIS — Z8674 Personal history of sudden cardiac arrest: Secondary | ICD-10-CM

## 2023-11-07 DIAGNOSIS — I10 Essential (primary) hypertension: Secondary | ICD-10-CM | POA: Diagnosis not present

## 2023-11-07 DIAGNOSIS — E876 Hypokalemia: Secondary | ICD-10-CM | POA: Diagnosis present

## 2023-11-07 DIAGNOSIS — Z5941 Food insecurity: Secondary | ICD-10-CM

## 2023-11-07 DIAGNOSIS — I2582 Chronic total occlusion of coronary artery: Secondary | ICD-10-CM | POA: Diagnosis present

## 2023-11-07 DIAGNOSIS — Z608 Other problems related to social environment: Secondary | ICD-10-CM | POA: Diagnosis present

## 2023-11-07 DIAGNOSIS — I130 Hypertensive heart and chronic kidney disease with heart failure and stage 1 through stage 4 chronic kidney disease, or unspecified chronic kidney disease: Secondary | ICD-10-CM | POA: Diagnosis present

## 2023-11-07 DIAGNOSIS — R4589 Other symptoms and signs involving emotional state: Secondary | ICD-10-CM

## 2023-11-07 DIAGNOSIS — N1832 Chronic kidney disease, stage 3b: Secondary | ICD-10-CM | POA: Diagnosis present

## 2023-11-07 DIAGNOSIS — N179 Acute kidney failure, unspecified: Secondary | ICD-10-CM | POA: Diagnosis present

## 2023-11-07 DIAGNOSIS — Z5986 Financial insecurity: Secondary | ICD-10-CM

## 2023-11-07 DIAGNOSIS — Z9185 Personal history of military service: Secondary | ICD-10-CM

## 2023-11-07 DIAGNOSIS — I272 Pulmonary hypertension, unspecified: Secondary | ICD-10-CM | POA: Diagnosis present

## 2023-11-07 DIAGNOSIS — E66812 Obesity, class 2: Secondary | ICD-10-CM | POA: Diagnosis present

## 2023-11-07 DIAGNOSIS — N189 Chronic kidney disease, unspecified: Secondary | ICD-10-CM | POA: Diagnosis present

## 2023-11-07 DIAGNOSIS — E785 Hyperlipidemia, unspecified: Secondary | ICD-10-CM | POA: Diagnosis present

## 2023-11-07 DIAGNOSIS — E669 Obesity, unspecified: Secondary | ICD-10-CM | POA: Diagnosis present

## 2023-11-07 DIAGNOSIS — I472 Ventricular tachycardia, unspecified: Secondary | ICD-10-CM | POA: Diagnosis present

## 2023-11-07 DIAGNOSIS — Z885 Allergy status to narcotic agent status: Secondary | ICD-10-CM

## 2023-11-07 DIAGNOSIS — Z515 Encounter for palliative care: Secondary | ICD-10-CM

## 2023-11-07 DIAGNOSIS — Z7151 Drug abuse counseling and surveillance of drug abuser: Secondary | ICD-10-CM

## 2023-11-07 DIAGNOSIS — F419 Anxiety disorder, unspecified: Secondary | ICD-10-CM | POA: Diagnosis present

## 2023-11-07 DIAGNOSIS — Z91148 Patient's other noncompliance with medication regimen for other reason: Secondary | ICD-10-CM

## 2023-11-07 DIAGNOSIS — Z6834 Body mass index (BMI) 34.0-34.9, adult: Secondary | ICD-10-CM

## 2023-11-07 DIAGNOSIS — Z66 Do not resuscitate: Secondary | ICD-10-CM | POA: Diagnosis present

## 2023-11-07 DIAGNOSIS — I2510 Atherosclerotic heart disease of native coronary artery without angina pectoris: Secondary | ICD-10-CM | POA: Diagnosis present

## 2023-11-07 LAB — CBC WITH DIFFERENTIAL/PLATELET
Abs Immature Granulocytes: 0.06 10*3/uL (ref 0.00–0.07)
Basophils Absolute: 0 10*3/uL (ref 0.0–0.1)
Basophils Relative: 0 %
Eosinophils Absolute: 0 10*3/uL (ref 0.0–0.5)
Eosinophils Relative: 0 %
HCT: 36.9 % — ABNORMAL LOW (ref 39.0–52.0)
Hemoglobin: 12.5 g/dL — ABNORMAL LOW (ref 13.0–17.0)
Immature Granulocytes: 1 %
Lymphocytes Relative: 9 %
Lymphs Abs: 0.8 10*3/uL (ref 0.7–4.0)
MCH: 30.8 pg (ref 26.0–34.0)
MCHC: 33.9 g/dL (ref 30.0–36.0)
MCV: 90.9 fL (ref 80.0–100.0)
Monocytes Absolute: 0.7 10*3/uL (ref 0.1–1.0)
Monocytes Relative: 8 %
Neutro Abs: 7.4 10*3/uL (ref 1.7–7.7)
Neutrophils Relative %: 82 %
Platelets: 378 10*3/uL (ref 150–400)
RBC: 4.06 MIL/uL — ABNORMAL LOW (ref 4.22–5.81)
RDW: 15.4 % (ref 11.5–15.5)
WBC: 9 10*3/uL (ref 4.0–10.5)
nRBC: 0.2 % (ref 0.0–0.2)

## 2023-11-07 LAB — BRAIN NATRIURETIC PEPTIDE: B Natriuretic Peptide: >4500 pg/mL — ABNORMAL HIGH (ref 0.0–100.0)

## 2023-11-07 LAB — BASIC METABOLIC PANEL
Anion gap: 15 (ref 5–15)
BUN: 63 mg/dL — ABNORMAL HIGH (ref 6–20)
CO2: 22 mmol/L (ref 22–32)
Calcium: 9.2 mg/dL (ref 8.9–10.3)
Chloride: 100 mmol/L (ref 98–111)
Creatinine, Ser: 2.14 mg/dL — ABNORMAL HIGH (ref 0.61–1.24)
GFR, Estimated: 35 mL/min — ABNORMAL LOW (ref >60)
Glucose, Bld: 94 mg/dL (ref 70–99)
Potassium: 3.5 mmol/L (ref 3.5–5.1)
Sodium: 137 mmol/L (ref 135–145)

## 2023-11-07 LAB — RESP PANEL BY RT-PCR (RSV, FLU A&B, COVID)  RVPGX2
Influenza A by PCR: NEGATIVE
Influenza B by PCR: NEGATIVE
Resp Syncytial Virus by PCR: NEGATIVE
SARS Coronavirus 2 by RT PCR: NEGATIVE

## 2023-11-07 LAB — PROCALCITONIN: Procalcitonin: 0.58 ng/mL

## 2023-11-07 MED ORDER — ASPIRIN 81 MG PO TBEC
81.0000 mg | DELAYED_RELEASE_TABLET | Freq: Every day | ORAL | Status: DC
  Administered 2023-11-08 – 2023-11-14 (×7): 81 mg via ORAL
  Filled 2023-11-07 (×7): qty 1

## 2023-11-07 MED ORDER — VANCOMYCIN HCL IN DEXTROSE 1-5 GM/200ML-% IV SOLN
1000.0000 mg | INTRAVENOUS | Status: AC
  Administered 2023-11-07 (×2): 1000 mg via INTRAVENOUS
  Filled 2023-11-07: qty 200

## 2023-11-07 MED ORDER — SODIUM CHLORIDE 0.9 % IV SOLN
2.0000 g | Freq: Once | INTRAVENOUS | Status: AC
  Administered 2023-11-07: 2 g via INTRAVENOUS
  Filled 2023-11-07: qty 12.5

## 2023-11-07 MED ORDER — CLONAZEPAM 0.5 MG PO TABS
0.5000 mg | ORAL_TABLET | Freq: Two times a day (BID) | ORAL | Status: DC
  Administered 2023-11-07 – 2023-11-14 (×14): 0.5 mg via ORAL
  Filled 2023-11-07 (×14): qty 1

## 2023-11-07 MED ORDER — LEVALBUTEROL HCL 0.63 MG/3ML IN NEBU
0.6300 mg | INHALATION_SOLUTION | Freq: Three times a day (TID) | RESPIRATORY_TRACT | Status: DC
  Administered 2023-11-08 – 2023-11-11 (×10): 0.63 mg via RESPIRATORY_TRACT
  Filled 2023-11-07 (×13): qty 3

## 2023-11-07 MED ORDER — PROCHLORPERAZINE EDISYLATE 10 MG/2ML IJ SOLN
10.0000 mg | Freq: Four times a day (QID) | INTRAMUSCULAR | Status: DC | PRN
  Administered 2023-11-08 – 2023-11-14 (×9): 10 mg via INTRAVENOUS
  Filled 2023-11-07 (×11): qty 2

## 2023-11-07 MED ORDER — POTASSIUM CHLORIDE CRYS ER 20 MEQ PO TBCR
40.0000 meq | EXTENDED_RELEASE_TABLET | Freq: Once | ORAL | Status: AC
  Administered 2023-11-07: 40 meq via ORAL
  Filled 2023-11-07: qty 2

## 2023-11-07 MED ORDER — FUROSEMIDE 10 MG/ML IJ SOLN
40.0000 mg | Freq: Two times a day (BID) | INTRAMUSCULAR | Status: DC
  Administered 2023-11-07 – 2023-11-09 (×5): 40 mg via INTRAVENOUS
  Filled 2023-11-07 (×6): qty 4

## 2023-11-07 MED ORDER — MAGNESIUM OXIDE -MG SUPPLEMENT 400 (240 MG) MG PO TABS: 400.0000 mg | ORAL_TABLET | Freq: Every day | ORAL | Status: DC

## 2023-11-07 MED ORDER — ACETAMINOPHEN 650 MG RE SUPP: 650.0000 mg | Freq: Four times a day (QID) | RECTAL | Status: DC | PRN

## 2023-11-07 MED ORDER — MIRTAZAPINE 15 MG PO TBDP
15.0000 mg | ORAL_TABLET | Freq: Every day | ORAL | Status: DC
  Administered 2023-11-07 – 2023-11-13 (×7): 15 mg via ORAL
  Filled 2023-11-07 (×8): qty 1

## 2023-11-07 MED ORDER — LEVALBUTEROL HCL 1.25 MG/0.5ML IN NEBU
1.2500 mg | INHALATION_SOLUTION | Freq: Three times a day (TID) | RESPIRATORY_TRACT | Status: DC
  Administered 2023-11-07: 1.25 mg via RESPIRATORY_TRACT
  Filled 2023-11-07: qty 0.5

## 2023-11-07 MED ORDER — VANCOMYCIN HCL IN DEXTROSE 1-5 GM/200ML-% IV SOLN
1000.0000 mg | Freq: Once | INTRAVENOUS | Status: DC
  Filled 2023-11-07: qty 200

## 2023-11-07 MED ORDER — ALLOPURINOL 100 MG PO TABS
100.0000 mg | ORAL_TABLET | Freq: Every day | ORAL | Status: DC
  Administered 2023-11-08 – 2023-11-14 (×7): 100 mg via ORAL
  Filled 2023-11-07 (×7): qty 1

## 2023-11-07 MED ORDER — ACETAMINOPHEN 325 MG PO TABS: 650.0000 mg | ORAL_TABLET | Freq: Four times a day (QID) | ORAL | Status: DC | PRN

## 2023-11-07 MED ORDER — SODIUM CHLORIDE 0.9 % IV SOLN
2.0000 g | Freq: Two times a day (BID) | INTRAVENOUS | Status: DC
  Administered 2023-11-07 – 2023-11-08 (×3): 2 g via INTRAVENOUS
  Filled 2023-11-07 (×3): qty 12.5

## 2023-11-07 MED ORDER — VANCOMYCIN HCL IN DEXTROSE 1-5 GM/200ML-% IV SOLN
1000.0000 mg | INTRAVENOUS | Status: DC
  Administered 2023-11-08: 1000 mg via INTRAVENOUS
  Filled 2023-11-07: qty 200

## 2023-11-07 MED ORDER — ENOXAPARIN SODIUM 40 MG/0.4ML IJ SOSY
40.0000 mg | PREFILLED_SYRINGE | INTRAMUSCULAR | Status: DC
  Administered 2023-11-11 – 2023-11-13 (×3): 40 mg via SUBCUTANEOUS
  Filled 2023-11-07 (×5): qty 0.4

## 2023-11-07 MED ORDER — MIDODRINE HCL 5 MG PO TABS
15.0000 mg | ORAL_TABLET | Freq: Three times a day (TID) | ORAL | Status: DC
  Administered 2023-11-08 – 2023-11-14 (×21): 15 mg via ORAL
  Filled 2023-11-07 (×21): qty 3

## 2023-11-07 MED ORDER — FUROSEMIDE 10 MG/ML IJ SOLN
80.0000 mg | Freq: Once | INTRAMUSCULAR | Status: AC
  Administered 2023-11-07: 80 mg via INTRAVENOUS
  Filled 2023-11-07: qty 8

## 2023-11-07 MED ORDER — AMIODARONE HCL 200 MG PO TABS
200.0000 mg | ORAL_TABLET | Freq: Every day | ORAL | Status: DC
  Administered 2023-11-08 – 2023-11-14 (×7): 200 mg via ORAL
  Filled 2023-11-07 (×7): qty 1

## 2023-11-07 NOTE — ED Notes (Signed)
 attempted stick, unsuccessful
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
## 2023-11-07 NOTE — Progress Notes (Signed)
 Heart Failure Navigator Progress Note  Assessed for Heart & Vascular TOC clinic readiness.  Patient does not meet criteria due to [facility] Team patient of Dr. [name].   Navigator will sign off at this time.    Rhae Hammock, BSN, Scientist, clinical (histocompatibility and immunogenetics) Only

## 2023-11-07 NOTE — Progress Notes (Signed)
 Pharmacy Antibiotic Note  Dale [name] is a 63 y.o. male admitted on 11/07/2023 with vancomycin .  Pharmacy has been consulted for vancomycin  dosing.  Pt was discharged from hospital on 12/29 for cardiac issues   Plan: Cefepime  2 gr IV q8h ( MD)  Vancomycin  2000 mg IV x1 then Vancomycin  1000 mg IV q24h (AUC 484, Scr 2.14 mg/dl, Crcl 887 kg, BMI 34)      Temp (24hrs), Avg:97.5 F (36.4 C), Min:97.2 F (36.2 C), Max:97.7 F (36.5 C)  Recent Labs  Lab 11/02/23 0354 11/07/23 1050 11/07/23 1142  WBC  --   --  9.0  CREATININE 1.91* 2.14*  --     Estimated Creatinine Clearance: 46.7 mL/min (A) (by C-G formula based on SCr of 2.14 mg/dL (H)).    Allergies  Allergen Reactions   Hydrocodone  Itching    Antimicrobials this admission: 1/3 vancomycin  >>  1/3 cefepime  >>   Dose adjustments this admission:   Microbiology results: 1/3 Resp panel: negative  Strep pneumo antigen  Sputum culture     Dolphus Roller, PharmD, BCPS 11/07/2023 2:10 PM
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
## 2023-11-08 DIAGNOSIS — I5043 Acute on chronic combined systolic (congestive) and diastolic (congestive) heart failure: Secondary | ICD-10-CM | POA: Diagnosis not present

## 2023-11-08 DIAGNOSIS — Z7189 Other specified counseling: Secondary | ICD-10-CM | POA: Diagnosis not present

## 2023-11-08 DIAGNOSIS — E669 Obesity, unspecified: Secondary | ICD-10-CM | POA: Diagnosis not present

## 2023-11-08 DIAGNOSIS — Z66 Do not resuscitate: Secondary | ICD-10-CM

## 2023-11-08 DIAGNOSIS — R4589 Other symptoms and signs involving emotional state: Secondary | ICD-10-CM

## 2023-11-08 DIAGNOSIS — J189 Pneumonia, unspecified organism: Secondary | ICD-10-CM | POA: Diagnosis not present

## 2023-11-08 DIAGNOSIS — Z515 Encounter for palliative care: Secondary | ICD-10-CM | POA: Diagnosis not present

## 2023-11-08 LAB — PROCALCITONIN: Procalcitonin: 0.56 ng/mL

## 2023-11-08 LAB — MAGNESIUM: Magnesium: 2.4 mg/dL (ref 1.7–2.4)

## 2023-11-08 LAB — MRSA NEXT GEN BY PCR, NASAL: MRSA by PCR Next Gen: NOT DETECTED

## 2023-11-08 MED ORDER — METOPROLOL TARTRATE 5 MG/5ML IV SOLN: 5.0000 mg | INTRAVENOUS | Status: DC | PRN

## 2023-11-08 MED ORDER — IPRATROPIUM-ALBUTEROL 0.5-2.5 (3) MG/3ML IN SOLN
3.0000 mL | RESPIRATORY_TRACT | Status: DC | PRN
  Administered 2023-11-10: 3 mL via RESPIRATORY_TRACT
  Filled 2023-11-08: qty 3

## 2023-11-08 MED ORDER — HYDRALAZINE HCL 20 MG/ML IJ SOLN: 10.0000 mg | INTRAMUSCULAR | Status: DC | PRN

## 2023-11-08 NOTE — Plan of Care (Signed)

## 2023-11-08 NOTE — Progress Notes (Signed)
 PROGRESS NOTE    Joachim Carton  FMW:[id number] DOB: 05-Feb-1961 DOA: 11/07/2023 PCP: Pcp, No    Brief Narrative:  63 year old with history of alcohol  abuse, CAD, cardiorenal syndrome, CHF with reduced EF, ischemic cardiomyopathy, stage IV CKD, cocaine use, HLD, HTN, morbid obesity, medication noncompliance was recently admitted to the hospital for 20 days for acute respiratory failure complicated by CHF exacerbation and AKI.  Upon admission noted to be in CHF exacerbation again and chest x-ray showing right lower lobe consolidation.  Upon admission patient started on IV antibiotics and diuretics.   Assessment & Plan:  Principal Problem:   Right lower lobe pneumonia Active Problems:   Alcohol  use disorder, moderate, in early remission (HCC)   Essential hypertension   Obesity (BMI 30-39.9)   Cocaine abuse (HCC)   Normochromic anemia   Hyperlipidemia   NSVT (nonsustained ventricular tachycardia) (HCC)   Acute on chronic combined systolic and diastolic HF (heart failure) (HCC)   Coronary artery disease involving native coronary artery of native heart without angina pectoris   Hypokalemia   MDD (major depressive disorder), recurrent severe, without psychosis (HCC)    Right lower lobe pneumonia Chest x-ray showing right lower lobe consolidation.  Procalcitonin 0.58.  Will empirically treat with vancomycin /cefepime .  Slowly de-escalate as appropriate.     Acute on chronic combined systolic and diastolic HF (heart failure) (HCC) Recent echocardiogram in December 2024 showed EF less than 20% with severely reduced LV function, global hypokinesia, grade 2 DD with dilated cardiomyopathy.  BNP significantly elevated.  Recently cardiology team has determined that apparently patient is at end-stage heart failure with low output with no further options for advanced therapies especially due to his substance abuse issues. - Continue Lasix  40 mg IV twice daily.     NSVT (nonsustained ventricular  tachycardia) (HCC)  Per documentation recently had V-fib arrest.  Currently on amiodarone  200 mg daily.     Coronary artery disease involving native  coronary artery of native heart without angina pectoris Continue aspirin  81 mg p.o. daily. Apparently off statin due to EtOH. Unable to tolerate beta-blockers without getting hypotensive.     Hypokalemia Replete as needed     MDD (major depressive disorder),  recurrent severe, without psychosis (HCC) Continue mirtazapine  15 mg p.o. at bedtime. Continue clonazepam  0.5 mg p.o. twice daily as needed.     Essential hypertension On Lasix .  Requiring midodrine      Alcohol  use disorder, moderate, in early remission [facility]) The patient stated he has not been drinking.     Obesity (BMI 30-39.9) Tobacco cessation advised. Nicotine replacement therapy ordered.     Cocaine abuse (HCC) In remission.     Normochromic anemia Monitor hematocrit and hemoglobin.     Hyperlipidemia Off statin. Follow-up with PCP  Per recent documentation it states that patient was appropriate for hospice/comfort care with no further escalation of care especially if his condition deteriorates.  I will reengage palliative care to again help determine goals of care.  Patient has very poor prognosis.  DVT prophylaxis: enoxaparin  (LOVENOX ) injection 40 mg Start: 11/07/23 2200    Code Status: Limited: Do not attempt resuscitation (DNR) -DNR-LIMITED -Do Not Intubate/DNI  Family Communication:  called [name], no answer.  Status is: Inpatient Remains inpatient appropriate because: Overall poor prognosis due to medication noncompliance, substance abuse history and end-stage heart failure.    Subjective: Sitting up in the side of the bed Admits of medication noncompliance. But tells me he didn't take it because he did not  get his meds.    Examination:  General exam: Appears calm and comfortable  Respiratory system: bibasilar crackles.  Cardiovascular system: S1  & S2 heard, RRR. No JVD, murmurs, rubs, gallops or clicks. 3+ b/l LE edema.  Gastrointestinal system: Abdomen is nondistended, soft and nontender. No organomegaly or masses felt. Normal bowel sounds heard. Central nervous system: Alert and oriented. No focal neurological deficits. Extremities: Symmetric 5 x 5 power. Skin: No rashes, lesions or ulcers Psychiatry: Judgement and insight appear normal. Mood & affect appropriate.                Diet Orders (From admission, onward)     Start     Ordered   11/07/23 1345  Diet Heart Room service appropriate? Yes; Fluid consistency: Thin; Fluid restriction: 1200 mL Fluid  Diet effective now       Question Answer Comment  Room service appropriate? Yes   Fluid consistency: Thin   Fluid restriction: 1200 mL Fluid      11/07/23 1346            Objective: Vitals:   11/07/23 2229 11/08/23 0216 11/08/23 0551 11/08/23 0915  BP: 102/77 120/77 (!) 117/102   Pulse: 96 88 96   Resp: 18 18 17    Temp:      TempSrc:      SpO2: 96% 95% 99% (S) (!) 81%  Weight:      Height:        Intake/Output Summary (Last 24 hours) at 11/08/2023 1044 Last data filed at 11/08/2023 0756 Gross per 24 hour  Intake 770 ml  Output 300 ml  Net 470 ml   Filed Weights   11/07/23 1833  Weight: 116.4 kg    Scheduled Meds:  allopurinol   100 mg Oral Daily   amiodarone   200 mg Oral Daily   aspirin  EC  81 mg Oral Daily   clonazePAM   0.5 mg Oral BID   enoxaparin  (LOVENOX ) injection  40 mg Subcutaneous Q24H   furosemide   40 mg Intravenous BID   levalbuterol   0.63 mg Nebulization TID   midodrine   15 mg Oral TID WC   mirtazapine   15 mg Oral QHS   Continuous Infusions:  ceFEPime  (MAXIPIME ) IV 2 g (11/08/23 0902)   vancomycin       Nutritional status     Body mass index is 35.79 kg/m.  Data Reviewed:   CBC: Recent Labs  Lab 11/07/23 1142  WBC 9.0  NEUTROABS 7.4  HGB 12.5*  HCT 36.9*  MCV 90.9  PLT 378   Basic Metabolic Panel: Recent  Labs  Lab 11/02/23 0354 11/07/23 1050 11/08/23 0706  NA 138 137  --   K 3.8 3.5  --   CL 100 100  --   CO2 26 22  --   GLUCOSE 140* 94  --   BUN 51* 63*  --   CREATININE 1.91* 2.14*  --   CALCIUM  9.2 9.2  --   MG 2.3  --  2.4  PHOS 3.6  --   --    GFR: Estimated Creatinine Clearance: 47.6 mL/min (A) (by C-G formula based on SCr of 2.14 mg/dL (H)). Liver Function Tests: No results for input(s): AST, ALT, ALKPHOS, BILITOT, PROT, ALBUMIN  in the last 168 hours. No results for input(s): LIPASE, AMYLASE in the last 168 hours. No results for input(s): AMMONIA in the last 168 hours. Coagulation Profile: No results for input(s): INR, PROTIME in the last 168 hours. Cardiac Enzymes: No results for  input(s): CKTOTAL, CKMB, CKMBINDEX, TROPONINI in the last 168 hours. BNP (last 3 results) No results for input(s): PROBNP in the last 8760 hours. HbA1C: No results for input(s): HGBA1C in the last 72 hours. CBG: No results for input(s): GLUCAP in the last 168 hours. Lipid Profile: No results for input(s): CHOL, HDL, LDLCALC, TRIG, CHOLHDL, LDLDIRECT in the last 72 hours. Thyroid Function Tests: No results for input(s): TSH, T4TOTAL, FREET4, T3FREE, THYROIDAB in the last 72 hours. Anemia Panel: No results for input(s): VITAMINB12, FOLATE, FERRITIN, TIBC, IRON, RETICCTPCT in the last 72 hours. Sepsis Labs: Recent Labs  Lab 11/07/23 1050 11/08/23 0706  PROCALCITON 0.58 0.56    Recent Results (from the past 240 hours)  Resp panel by RT-PCR (RSV, Flu A&B, Covid) Anterior Nasal Swab     Status: None   Collection Time: 11/07/23 11:53 AM   Specimen: Anterior Nasal Swab  Result Value Ref Range Status   SARS Coronavirus 2 by RT PCR NEGATIVE NEGATIVE Final    Comment: (NOTE) SARS-CoV-2 target nucleic acids are NOT DETECTED.  The SARS-CoV-2 RNA is generally detectable in upper respiratory specimens during the acute phase  of infection. The lowest concentration of SARS-CoV-2 viral copies this assay can detect is 138 copies/mL. A negative result does not preclude SARS-Cov-2 infection and should not be used as the sole basis for treatment or other patient management decisions. A negative result may occur with  improper specimen collection/handling, submission of specimen other than nasopharyngeal swab, presence of viral mutation(s) within the areas targeted by this assay, and inadequate number of viral copies(<138 copies/mL). A negative result must be combined with clinical observations, patient history, and epidemiological information. The expected result is Negative.  Fact Sheet for Patients:  bloggercourse.com  Fact Sheet for Healthcare Providers:  seriousbroker.it  This test is no t yet approved or cleared by the [place]  FDA and  has been authorized for detection and/or diagnosis of SARS-CoV-2 by FDA under an Emergency Use Authorization (EUA). This EUA will remain  in effect (meaning this test can be used) for the duration of the COVID-19 declaration under Section 564(b)(1) of the Act, 21 U.S.C.section 360bbb-3(b)(1), unless the authorization is terminated  or revoked sooner.       Influenza A by PCR NEGATIVE NEGATIVE Final   Influenza B by PCR NEGATIVE NEGATIVE Final    Comment: (NOTE) The Xpert Xpress SARS-CoV-2/FLU/RSV plus assay is intended as an aid in the diagnosis of influenza from Nasopharyngeal swab specimens and should not be used as a sole basis for treatment. Nasal washings and aspirates are unacceptable for Xpert Xpress SARS-CoV-2/FLU/RSV testing.  Fact Sheet for Patients: bloggercourse.com  Fact Sheet for Healthcare Providers: seriousbroker.it  This test is not yet approved or cleared by the [place]  FDA and has been authorized for detection and/or diagnosis of  SARS-CoV-2 by FDA under an Emergency Use Authorization (EUA). This EUA will remain in effect (meaning this test can be used) for the duration of the COVID-19 declaration under Section 564(b)(1) of the Act, 21 U.S.C. section 360bbb-3(b)(1), unless the authorization is terminated or revoked.     Resp Syncytial Virus by PCR NEGATIVE NEGATIVE Final    Comment: (NOTE) Fact Sheet for Patients: bloggercourse.com  Fact Sheet for Healthcare Providers: seriousbroker.it  This test is not yet approved or cleared by the [place]  FDA and has been authorized for detection and/or diagnosis of SARS-CoV-2 by FDA under an Emergency Use Authorization (EUA). This EUA will remain in effect (meaning this test can  be used) for the duration of the COVID-19 declaration under Section 564(b)(1) of the Act, 21 U.S.C. section 360bbb-3(b)(1), unless the authorization is terminated or revoked.  Performed at [facility], [address], [place], [place] [address]          Radiology Studies: DG Chest 2 View Result Date: 11/07/2023 CLINICAL DATA:  shob EXAM: CHEST - 2 VIEW COMPARISON:  Chest x-ray 07/25/2023, CT chest 05/11/2021 FINDINGS: Persistent cardiomegaly. The heart and mediastinal contours are unchanged. Right lower lobe consolidation. No pulmonary edema. No pleural effusion. No pneumothorax. No acute osseous abnormality. IMPRESSION: Right lower lobe consolidation. Followup PA and lateral chest X-ray is recommended in 3-4 weeks following trial of antibiotic therapy to ensure resolution and exclude underlying malignancy. Electronically Signed   By: Morgane  Naveau M.D.   On: 11/07/2023 02:41           LOS: 1 day   Time spent= 35 mins    Burgess JAYSON Dare, MD [facility]  If 7PM-7AM, please contact night-coverage  11/08/2023, 10:44 AM

## 2023-11-08 NOTE — Plan of Care (Signed)
  Problem: Activity: Goal: Risk for activity intolerance will decrease Outcome: Progressing   Problem: Elimination: Goal: Will not experience complications related to bowel motility Outcome: Progressing   Problem: Pain Management: Goal: General experience of comfort will improve Outcome: Progressing   Problem: Safety: Goal: Ability to remain free from injury will improve Outcome: Progressing   Problem: Skin Integrity: Goal: Risk for impaired skin integrity will decrease Outcome: Progressing

## 2023-11-08 NOTE — Hospital Course (Addendum)
 The patient is a 63 year old with history of alcohol  abuse, CAD, cardiorenal syndrome, CHF with reduced EF, ischemic cardiomyopathy, stage IV CKD, cocaine use, HLD, HTN, morbid obesity, medication noncompliance was recently admitted to the hospital for 20 days for acute respiratory failure complicated by CHF exacerbation and AKI.  Upon admission noted to be in CHF exacerbation again and chest x-ray showing right lower lobe consolidation.  Upon admission patient started on IV antibiotics and diuretics.  Initially felt better thereafter complicated by AKI and acidosis.  Seen by cardiology but unfortunately does not have much options at this point.  Patient was continued on diuretics and received high doses of IV Lasix  and Zaroxolyn  while hospitalized and received a dose of Zaroxolyn  today.  Psychiatry was also consulted and they recommended discharging the patient home with outpatient follow-up given that he did not have any behaviors that appeared to be an imminent risk to himself or others.  The case was discussed with the cardiologist Dr. Raford and because the patient had a place to go he is felt that he could be transition to oral diuretics with close PCP and cardiology follow-up with outpatient referral to palliative care given his end-stage heart failure.  If the patient did not have a place to go cardiology had recommended continuing IV diuresis but since he had a bed with his mom and awaiting [facility] that will be much more socially acceptable for him he will be discharged home on 100 mg of torsemide  twice daily with an additional 5 mg of metolazone  twice per week on Mondays and Fridays with an additional 40 mill colons of KCl.  At this time cardiology feels that he appears medically stable for discharge and will be discharged home with his mother and will need to follow-up with PCP and cardiology in outpatient setting with outpatient palliative care referral.  Assessment & Plan:  Principal Problem:    Right lower lobe pneumonia Active Problems:   Alcohol  use disorder, moderate, in early remission (HCC)   Essential hypertension   Obesity (BMI 30-39.9)   Cocaine abuse (HCC)   Normochromic anemia   Hyperlipidemia   NSVT (nonsustained ventricular tachycardia) (HCC)   Acute on chronic combined systolic and diastolic HF (heart failure) (HCC)   Coronary artery disease involving native coronary artery of native heart without angina pectoris   Hypokalemia   MDD (major depressive disorder), recurrent severe, without psychosis (HCC)    AKI on CKD stage IIIb Severe metabolic acidosis -Baseline creatinine 1.9.  Creatinine peaked at 2.43 but improving with diuretics. -BUN/Cr Trend: Recent Labs  Lab 11/07/23 1050 11/09/23 0615 11/10/23 0528 11/11/23 0519 11/12/23 0526 11/13/23 0518 11/14/23 0552  BUN 63* 55* 55* 71* 60* 49* 49*  CREATININE 2.14* 1.59* 1.98* 2.43* 1.96* 1.69* 1.46*  -Metabolic acidosis improved and he has a CO2 of 32, anion gap of 10, chloride level 93 -Cardiorenal/hypoperfusion leading to a lot of his issues. -Avoid Nephrotoxic Medications, Contrast Dyes, Hypotension and Dehydration to Ensure Adequate Renal Perfusion and will need to Renally Adjust Meds -Continue to Monitor and Trend Renal Function carefully and repeat CMP within 1 week  Acute on chronic combined systolic and diastolic HF (heart failure) (HCC) Bilateral lower extremity swelling -Recent echocardiogram in December 2024 showed EF less than 20% with severely reduced LV function, global hypokinesia, grade 2 DD with dilated cardiomyopathy.  Not a candidate for any advanced cardiac medical therapy at this point.  Very poor compliance. - On Lasix  120 mg twice daily while hospitalized.  Intermittently getting metaxalone.  Good response to this but still remained volume overloaded.  Discussion with cardiology was had and because patient has a bed to go they feel that he can still be discharged on oral diuretics with  close PCP follow-up as well as cardiology follow-up.  He is transition to oral torsemide  100 mg twice daily with additional 5 mg of Zaroxolyn  on Monday Fridays with 40 mill colons of KCl at that time No intake or output data in the 24 hours ending 11/16/23 2212  -Given his end-stage heart failure he will be referred to palliative care in outpatient setting  Nausea vomiting - X-rays negative.  Suspect from bowel wall edema.  Antiemetics as needed continue diuresis -Improved  Right lower lobe pneumonia -Chest x-ray showing right lower lobe consolidation.  Procalcitonin 0.58.  De-escalate vancomycin /cefepime  to Augmentin  for 5 more days to complete total 7-day course.  EOT 1/9. -Respiratory status is stable now  SpO2: 98 % O2 Flow Rate (L/min): 18 L/min FiO2 (%): 21 %; Off of Supplemental O2 altogether and tolerating Room Air  Hypokalemia -Patient's K+ Level Trend: Recent Labs  Lab 11/07/23 1050 11/09/23 0615 11/10/23 0528 11/11/23 0519 11/12/23 0526 11/13/23 0518 11/14/23 0552  K 3.5 2.8* 4.8 5.1 3.3* 2.9* 3.0*  -Replete with p.o. KCl 40 mill colons 3 times daily today and will need twice daily dosing going forward given his diuretics and will take an additional 40 mill equivalents with her Zaroxolyn  doses -Continue to Monitor and Replete as Necessary -Repeat CMP in the AM   NSVT (nonsustained ventricular tachycardia) (HCC)  Per documentation recently had V-fib arrest.  Currently on amiodarone  200 mg daily. -Will need close cardiology follow-up in outpatient setting   Coronary artery disease -Continue aspirin , off statin -Unable to tolerate beta-blockers -Will need close cardiology follow-up in outpatient setting   MDD (major depressive disorder),  recurrent severe, without psychosis (HCC) -Continue bedtime mirtazapine .  Klonopin  twice daily. -Psychiatry consulted and felt no changes in the medications recommends discharging him with outpatient resources   Essential  hypertension -On Lasix .  Requiring midodrine  -Follow-up with cardiology outpatient setting   Alcohol  use disorder, moderate, in early remission [facility]) The patient stated he has not been drinking.   Tobacco abuse Tobacco cessation advised. Nicotine replacement therapy ordered.   Cocaine abuse (HCC) -In remission.  His last UDS was negative for cocaine   Normocytic Anemia -Hgb/Hct Trend: Recent Labs  Lab 11/07/23 1142 11/09/23 0615 11/10/23 0528 11/11/23 0519 11/12/23 0526 11/13/23 0518 11/14/23 0552  HGB 12.5* 12.2* 13.3 11.9* 14.4 13.0 11.5*  HCT 36.9* 38.4* 41.7 35.1* 42.2 39.3 34.5*  MCV 90.9 93.0 95.2 90.5 87.9 91.0 89.6  -Outpatient anemia panel and close follow-up with PCP   Hyperlipidemia -Off statin. -Follow-up with PCP  Class II Obesity -Complicates overall prognosis and care -Estimated body mass index is 35.33 kg/m as calculated from the following:   Height as of this encounter: 5' 11 (1.803 m).   Weight as of this encounter: 114.9 kg.  -Weight Loss and Dietary Counseling given  GOC Seen by palliative care, overall poor prognosis. Seen by [facility] for safe Dispo patient will be going home with his mother while he [facility] bed
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
## 2023-11-08 NOTE — Progress Notes (Signed)
   11/08/23 0915  Oxygen Therapy/Pulse Ox  SpO2 (!) (S)  81 % (Pt was on room air, stated that he uses 3 L @ home, RT applied 3 L [place] post neb tx. given with 02.)

## 2023-11-09 DIAGNOSIS — Z515 Encounter for palliative care: Secondary | ICD-10-CM | POA: Diagnosis not present

## 2023-11-09 DIAGNOSIS — F1021 Alcohol dependence, in remission: Secondary | ICD-10-CM | POA: Diagnosis not present

## 2023-11-09 DIAGNOSIS — J189 Pneumonia, unspecified organism: Secondary | ICD-10-CM | POA: Diagnosis not present

## 2023-11-09 DIAGNOSIS — Z7189 Other specified counseling: Secondary | ICD-10-CM | POA: Diagnosis not present

## 2023-11-09 DIAGNOSIS — Z66 Do not resuscitate: Secondary | ICD-10-CM | POA: Diagnosis not present

## 2023-11-09 LAB — CBC
HCT: 38.4 % — ABNORMAL LOW (ref 39.0–52.0)
Hemoglobin: 12.2 g/dL — ABNORMAL LOW (ref 13.0–17.0)
MCH: 29.5 pg (ref 26.0–34.0)
MCHC: 31.8 g/dL (ref 30.0–36.0)
MCV: 93 fL (ref 80.0–100.0)
Platelets: 395 10*3/uL (ref 150–400)
RBC: 4.13 MIL/uL — ABNORMAL LOW (ref 4.22–5.81)
RDW: 15.9 % — ABNORMAL HIGH (ref 11.5–15.5)
WBC: 8 10*3/uL (ref 4.0–10.5)
nRBC: 0 % (ref 0.0–0.2)

## 2023-11-09 LAB — BASIC METABOLIC PANEL
Anion gap: 11 (ref 5–15)
BUN: 55 mg/dL — ABNORMAL HIGH (ref 6–20)
CO2: 28 mmol/L (ref 22–32)
Calcium: 9 mg/dL (ref 8.9–10.3)
Chloride: 100 mmol/L (ref 98–111)
Creatinine, Ser: 1.59 mg/dL — ABNORMAL HIGH (ref 0.61–1.24)
GFR, Estimated: 49 mL/min — ABNORMAL LOW (ref >60)
Glucose, Bld: 117 mg/dL — ABNORMAL HIGH (ref 70–99)
Potassium: 2.8 mmol/L — ABNORMAL LOW (ref 3.5–5.1)
Sodium: 139 mmol/L (ref 135–145)

## 2023-11-09 LAB — PHOSPHORUS: Phosphorus: 3.7 mg/dL (ref 2.5–4.6)

## 2023-11-09 MED ORDER — POTASSIUM CHLORIDE CRYS ER 20 MEQ PO TBCR
40.0000 meq | EXTENDED_RELEASE_TABLET | ORAL | Status: AC
  Administered 2023-11-09 (×4): 40 meq via ORAL
  Filled 2023-11-09 (×4): qty 2

## 2023-11-09 MED ORDER — AMOXICILLIN-POT CLAVULANATE 875-125 MG PO TABS
1.0000 | ORAL_TABLET | Freq: Two times a day (BID) | ORAL | Status: AC
  Administered 2023-11-09 – 2023-11-13 (×10): 1 via ORAL
  Filled 2023-11-09 (×10): qty 1

## 2023-11-09 MED ORDER — OXYCODONE HCL 5 MG PO TABS: 5.0000 mg | ORAL_TABLET | Freq: Four times a day (QID) | ORAL | Status: DC | PRN

## 2023-11-09 NOTE — Plan of Care (Signed)
  Problem: Education: Goal: Knowledge of General Education information will improve Description: Including pain rating scale, medication(s)/side effects and non-pharmacologic comfort measures Outcome: Progressing   Problem: Health Behavior/Discharge Planning: Goal: Ability to manage health-related needs will improve Outcome: Progressing   Problem: Clinical Measurements: Goal: Ability to maintain clinical measurements within normal limits will improve Outcome: Progressing Goal: Will remain free from infection Outcome: Progressing Goal: Diagnostic test results will improve Outcome: Progressing Goal: Respiratory complications will improve Outcome: Progressing Goal: Cardiovascular complication will be avoided Outcome: Progressing   Problem: Activity: Goal: Risk for activity intolerance will decrease Outcome: Progressing   Problem: Coping: Goal: Level of anxiety will decrease Outcome: Progressing   Problem: Elimination: Goal: Will not experience complications related to bowel motility Outcome: Progressing Goal: Will not experience complications related to urinary retention Outcome: Progressing   Problem: Pain Management: Goal: General experience of comfort will improve Outcome: Progressing   Problem: Safety: Goal: Ability to remain free from injury will improve Outcome: Progressing   Problem: Education: Goal: Ability to demonstrate management of disease process will improve Outcome: Progressing   Problem: Activity: Goal: Capacity to carry out activities will improve Outcome: Progressing   Problem: Cardiac: Goal: Ability to achieve and maintain adequate cardiopulmonary perfusion will improve Outcome: Progressing

## 2023-11-09 NOTE — Plan of Care (Signed)

## 2023-11-09 NOTE — Progress Notes (Signed)
 Daily Progress Note   Patient Name: Dale [name]       Date: 11/09/2023 DOB: 10-10-1961  Age: 63 y.o. MRN#: [id number] Attending Physician: Dale Burgess BROCKS, MD Primary Care Physician: Pcp, No Admit Date: 11/07/2023 Length of Stay: 2 days  Reason for Consultation/Follow-up: Establishing goals of care  Subjective:   CC: Patient notes his symptoms continue to improve with medication management.  Following up regarding complex medical decision making.  Subjective:  Reviewed EMR prior to presenting to bedside.  Patient's creatinine has down trended to 1.59 today.  Patient noted to have good urine output with the use of Lasix . Discussed care with tech for updates.  Presented to bedside to meet patient.  Patient seen sitting up in chair at bedside washing his face with washcloth.  Patient will come to visit at this time.  Patient stated that he continues to feel better with medication management.  Patient's breathing has greatly improved.  Patient hopes to continue with medical management for improvement at this time.  Discussed continuing appropriate medications while patient still having response to them.  Patient acknowledges that at some point these medications will no longer work for him though currently they manage his symptoms well.  Patient still agreeing to talk to chaplain about ACP documentation.  Patient also wants to talk to social worker about social support and housing.  Spent time providing emotional support via active listening.  Thanked patient for allowing me to visit with him today.  Discussed care with hospitalist regarding medical updates.  Objective:   Vital Signs:  BP (!) 143/114 (BP Location: Right Arm)   Pulse 90   Temp 97.6 F (36.4 C)   Resp 18   Ht 5' 11 (1.803 m)   Wt 116.4 kg   SpO2 98%   BMI 35.79 kg/m   Physical Exam: General: NAD, alert, pleasant, chronically ill-appearing Cardiovascular: RRR, pitting edema in LE b/l Respiratory: no increased work  of breathing noted, not in respiratory distress Neuro: A&Ox4, following commands easily Psych: appropriately answers all questions  Imaging: I personally reviewed recent imaging.   Assessment & Plan:   Assessment: Patient is a 63 year old male with a past medical history of alcohol  use, CAD, cardiorenal syndrome, HFrEF, ischemic cardiomyopathy, stage IV CKD, cocaine use, hyperlipidemia, and hypertension who was admitted on 11/07/2023 for management of worsening shortness of breath.  Patient had recently been admitted to the hospital for 20 days for acute respiratory failure complicated by CHF exacerbation and AKI.  Patient receiving management for CHF exacerbation again during this admission.  Palliative medicine team consulted to assist with complex medical decision making. Of note patient has been seen by palliative medicine team previously during hospitalizations.  Recommendations/Plan: # Complex medical decision making/goals of care:   -Discussed care with patient as detailed above in HPI.  Patient has endorsed social difficulties leading to patient readmitting to the hospital.  Patient still feels that he gets benefit out of coming to the hospital with IV Lasix  improving his symptom burden and fluid overload.  Patient is hopeful to obtain more steady social situation and housing so that he can continue appropriately taking medications as directed.  TOC  and SW consulted to assist with discharge planning.                -Patient can states that he could not speak for himself regarding medical decisions, he would want his mother, Dale [name], to be his HCPOA and his daughter, Dale [name], to be his  primary alternate.  Chaplain already consulted to assist with completion of ACP documentation.                -Patient has MOST form on file from 2022.  Patient has agreed with wishes already stated in this form including: DNR, limited additional interventions, determine use or limitation of  antibiotics when infection occurs, IV fluids for defined trial period, feeding tube for defined trial period.                -  Code Status: Limited: Do not attempt resuscitation (DNR) -DNR-LIMITED -Do Not Intubate/DNI     # Psycho-social/Spiritual Support:  - Support System: mother, daughter   # Discharge Planning:  To Be Determined  Discussed with: Patient, NT, hospitalist  Thank you for allowing the palliative care team to participate in the care Dale [name].  Dale Radar, DO Palliative Care Provider PMT # [phone]  If patient remains symptomatic despite maximum doses, please call PMT at [phone] between 0700 and 1900. Outside of these hours, please call attending, as PMT does not have night coverage.  *Please note that this is a verbal dictation therefore any spelling or grammatical errors are due to the Dragon [employer] One system interpretation.

## 2023-11-09 NOTE — Progress Notes (Signed)
 PROGRESS NOTE    Dale [name]  FMW:[id number] DOB: 1961-05-22 DOA: 11/07/2023 PCP: Pcp, No    Brief Narrative:  63 year old with history of alcohol  abuse, CAD, cardiorenal syndrome, CHF with reduced EF, ischemic cardiomyopathy, stage IV CKD, cocaine use, HLD, HTN, morbid obesity, medication noncompliance was recently admitted to the hospital for 20 days for acute respiratory failure complicated by CHF exacerbation and AKI.  Upon admission noted to be in CHF exacerbation again and chest x-ray showing right lower lobe consolidation.  Upon admission patient started on IV antibiotics and diuretics.   Assessment & Plan:  Principal Problem:   Right lower lobe pneumonia Active Problems:   Alcohol  use disorder, moderate, in early remission (HCC)   Essential hypertension   Obesity (BMI 30-39.9)   Cocaine abuse (HCC)   Normochromic anemia   Hyperlipidemia   NSVT (nonsustained ventricular tachycardia) (HCC)   Acute on chronic combined systolic and diastolic HF (heart failure) (HCC)   Coronary artery disease involving native coronary artery of native heart without angina pectoris   Hypokalemia   MDD (major depressive disorder), recurrent severe, without psychosis (HCC)    Right lower lobe pneumonia Chest x-ray showing right lower lobe consolidation.  Procalcitonin 0.58.  De-escalate vancomycin /cefepime  to Augmentin  for 5 more days to complete total 7-day course.     Acute on chronic combined systolic and diastolic HF (heart failure) (HCC) Bilateral lower extremity swelling Recent echocardiogram in December 2024 showed EF less than 20% with severely reduced LV function, global hypokinesia, grade 2 DD with dilated cardiomyopathy.  BNP significantly elevated.  Recently cardiology team has determined that apparently patient is at end-stage heart failure with low output with no further options for advanced therapies especially due to his substance abuse issues. - Continue Lasix  40 mg IV twice  daily. -Advised to elevate his legs, if necessary we can place Unna boots/wrap  CKD stage IIIb -Baseline creatinine 1.9.  Admission creatinine 2.14, improved with diuresis.  Hypokalemia - Aggressive repletion.     NSVT (nonsustained ventricular tachycardia) (HCC)  Per documentation recently had V-fib arrest.  Currently on amiodarone  200 mg daily.   Coronary artery disease Continue aspirin  81 mg p.o. daily. Apparently off statin due to EtOH. Unable to tolerate beta-blockers without getting hypotensive.     MDD (major depressive disorder),  recurrent severe, without psychosis (HCC) Continue bedtime mirtazapine .  Klonopin  twice daily.     Essential hypertension On Lasix .  Requiring midodrine      Alcohol  use disorder, moderate, in early remission [facility]) The patient stated he has not been drinking.     Obesity (BMI 30-39.9) Tobacco cessation advised. Nicotine replacement therapy ordered.     Cocaine abuse (HCC) In remission.     Normochromic anemia Monitor hematocrit and hemoglobin.     Hyperlipidemia Off statin. Follow-up with PCP  Per recent documentation it states that patient was appropriate for hospice/comfort care with no further escalation of care especially if his condition deteriorates.  I will reengage palliative care to again help determine goals of care.  Patient has very poor prognosis.  DVT prophylaxis: enoxaparin  (LOVENOX ) injection 40 mg Start: 11/07/23 2200    Code Status: Limited: Do not attempt resuscitation (DNR) -DNR-LIMITED -Do Not Intubate/DNI  Family Communication:  called [name], no answer.  Status is: Inpatient Remains inpatient appropriate because: Overall poor prognosis due to medication noncompliance, substance abuse history and end-stage heart failure.  Subjective: Sitting up in the bed, feels significantly better today with diuresis.   Examination:  General exam:  Appears calm and comfortable  Respiratory system: bibasilar crackles;  improved Cardiovascular system: S1 & S2 heard, RRR. No JVD, murmurs, rubs, gallops or clicks. 3+ b/l LE edema.  Gastrointestinal system: Abdomen is nondistended, soft and nontender. No organomegaly or masses felt. Normal bowel sounds heard. Central nervous system: Alert and oriented. No focal neurological deficits. Extremities: Symmetric 5 x 5 power. Skin: No rashes, lesions or ulcers Psychiatry: Judgement and insight appear normal. Mood & affect appropriate.                Diet Orders (From admission, onward)     Start     Ordered   11/07/23 1345  Diet Heart Room service appropriate? Yes; Fluid consistency: Thin; Fluid restriction: 1200 mL Fluid  Diet effective now       Question Answer Comment  Room service appropriate? Yes   Fluid consistency: Thin   Fluid restriction: 1200 mL Fluid      11/07/23 1346            Objective: Vitals:   11/08/23 2227 11/09/23 0244 11/09/23 0423 11/09/23 0815  BP: 110/87  (!) 143/114   Pulse: 85  90   Resp: 18  18   Temp: 98.1 F (36.7 C)  97.6 F (36.4 C)   TempSrc:      SpO2: 99%  97% 98%  Weight:  116.4 kg    Height:        Intake/Output Summary (Last 24 hours) at 11/09/2023 1043 Last data filed at 11/09/2023 0900 Gross per 24 hour  Intake 860 ml  Output 2150 ml  Net -1290 ml   Filed Weights   11/07/23 1833 11/09/23 0244  Weight: 116.4 kg 116.4 kg    Scheduled Meds:  allopurinol   100 mg Oral Daily   amiodarone   200 mg Oral Daily   amoxicillin -clavulanate  1 tablet Oral Q12H   aspirin  EC  81 mg Oral Daily   clonazePAM   0.5 mg Oral BID   enoxaparin  (LOVENOX ) injection  40 mg Subcutaneous Q24H   furosemide   40 mg Intravenous BID   levalbuterol   0.63 mg Nebulization TID   midodrine   15 mg Oral TID WC   mirtazapine   15 mg Oral QHS   potassium chloride   40 mEq Oral Q4H   Continuous Infusions:  Nutritional status     Body mass index is 35.79 kg/m.  Data Reviewed:   CBC: Recent Labs  Lab 11/07/23 1142  11/09/23 0615  WBC 9.0 8.0  NEUTROABS 7.4  --   HGB 12.5* 12.2*  HCT 36.9* 38.4*  MCV 90.9 93.0  PLT 378 395   Basic Metabolic Panel: Recent Labs  Lab 11/07/23 1050 11/08/23 0706 11/09/23 0615  NA 137  --  139  K 3.5  --  2.8*  CL 100  --  100  CO2 22  --  28  GLUCOSE 94  --  117*  BUN 63*  --  55*  CREATININE 2.14*  --  1.59*  CALCIUM  9.2  --  9.0  MG  --  2.4  --   PHOS  --   --  3.7   GFR: Estimated Creatinine Clearance: 64.1 mL/min (A) (by C-G formula based on SCr of 1.59 mg/dL (H)). Liver Function Tests: No results for input(s): AST, ALT, ALKPHOS, BILITOT, PROT, ALBUMIN  in the last 168 hours. No results for input(s): LIPASE, AMYLASE in the last 168 hours. No results for input(s): AMMONIA in the last 168 hours. Coagulation Profile: No results for  input(s): INR, PROTIME in the last 168 hours. Cardiac Enzymes: No results for input(s): CKTOTAL, CKMB, CKMBINDEX, TROPONINI in the last 168 hours. BNP (last 3 results) No results for input(s): PROBNP in the last 8760 hours. HbA1C: No results for input(s): HGBA1C in the last 72 hours. CBG: No results for input(s): GLUCAP in the last 168 hours. Lipid Profile: No results for input(s): CHOL, HDL, LDLCALC, TRIG, CHOLHDL, LDLDIRECT in the last 72 hours. Thyroid Function Tests: No results for input(s): TSH, T4TOTAL, FREET4, T3FREE, THYROIDAB in the last 72 hours. Anemia Panel: No results for input(s): VITAMINB12, FOLATE, FERRITIN, TIBC, IRON, RETICCTPCT in the last 72 hours. Sepsis Labs: Recent Labs  Lab 11/07/23 1050 11/08/23 0706  PROCALCITON 0.58 0.56    Recent Results (from the past 240 hours)  Resp panel by RT-PCR (RSV, Flu A&B, Covid) Anterior Nasal Swab     Status: None   Collection Time: 11/07/23 11:53 AM   Specimen: Anterior Nasal Swab  Result Value Ref Range Status   SARS Coronavirus 2 by RT PCR NEGATIVE NEGATIVE Final    Comment:  (NOTE) SARS-CoV-2 target nucleic acids are NOT DETECTED.  The SARS-CoV-2 RNA is generally detectable in upper respiratory specimens during the acute phase of infection. The lowest concentration of SARS-CoV-2 viral copies this assay can detect is 138 copies/mL. A negative result does not preclude SARS-Cov-2 infection and should not be used as the sole basis for treatment or other patient management decisions. A negative result may occur with  improper specimen collection/handling, submission of specimen other than nasopharyngeal swab, presence of viral mutation(s) within the areas targeted by this assay, and inadequate number of viral copies(<138 copies/mL). A negative result must be combined with clinical observations, patient history, and epidemiological information. The expected result is Negative.  Fact Sheet for Patients:  bloggercourse.com  Fact Sheet for Healthcare Providers:  seriousbroker.it  This test is no t yet approved or cleared by the [place]  FDA and  has been authorized for detection and/or diagnosis of SARS-CoV-2 by FDA under an Emergency Use Authorization (EUA). This EUA will remain  in effect (meaning this test can be used) for the duration of the COVID-19 declaration under Section 564(b)(1) of the Act, 21 U.S.C.section 360bbb-3(b)(1), unless the authorization is terminated  or revoked sooner.       Influenza A by PCR NEGATIVE NEGATIVE Final   Influenza B by PCR NEGATIVE NEGATIVE Final    Comment: (NOTE) The Xpert Xpress SARS-CoV-2/FLU/RSV plus assay is intended as an aid in the diagnosis of influenza from Nasopharyngeal swab specimens and should not be used as a sole basis for treatment. Nasal washings and aspirates are unacceptable for Xpert Xpress SARS-CoV-2/FLU/RSV testing.  Fact Sheet for Patients: bloggercourse.com  Fact Sheet for Healthcare  Providers: seriousbroker.it  This test is not yet approved or cleared by the [place]  FDA and has been authorized for detection and/or diagnosis of SARS-CoV-2 by FDA under an Emergency Use Authorization (EUA). This EUA will remain in effect (meaning this test can be used) for the duration of the COVID-19 declaration under Section 564(b)(1) of the Act, 21 U.S.C. section 360bbb-3(b)(1), unless the authorization is terminated or revoked.     Resp Syncytial Virus by PCR NEGATIVE NEGATIVE Final    Comment: (NOTE) Fact Sheet for Patients: bloggercourse.com  Fact Sheet for Healthcare Providers: seriousbroker.it  This test is not yet approved or cleared by the [place]  FDA and has been authorized for detection and/or diagnosis of SARS-CoV-2 by FDA under an Emergency  Use Authorization (EUA). This EUA will remain in effect (meaning this test can be used) for the duration of the COVID-19 declaration under Section 564(b)(1) of the Act, 21 U.S.C. section 360bbb-3(b)(1), unless the authorization is terminated or revoked.  Performed at [facility], [address] [place], [place] [address]   MRSA Next Gen by PCR, Nasal     Status: None   Collection Time: 11/08/23  4:11 PM   Specimen: Nasal Mucosa; Nasal Swab  Result Value Ref Range Status   MRSA by PCR Next Gen NOT DETECTED NOT DETECTED Final    Comment: (NOTE) The GeneXpert MRSA Assay (FDA approved for NASAL specimens only), is one component of a comprehensive MRSA colonization surveillance program. It is not intended to diagnose MRSA infection nor to guide or monitor treatment for MRSA infections. Test performance is not FDA approved in patients less than 68 years old. Performed at [facility], [address] [place], [place] [address]          Radiology Studies: No results found.         LOS: 2  days   Time spent= 35 mins    Burgess JAYSON Dare, MD [facility]  If 7PM-7AM, please contact night-coverage  11/09/2023, 10:43 AM

## 2023-11-10 ENCOUNTER — Inpatient Hospital Stay (HOSPITAL_COMMUNITY): Payer: MEDICAID

## 2023-11-10 DIAGNOSIS — N179 Acute kidney failure, unspecified: Secondary | ICD-10-CM

## 2023-11-10 DIAGNOSIS — J189 Pneumonia, unspecified organism: Secondary | ICD-10-CM | POA: Diagnosis not present

## 2023-11-10 DIAGNOSIS — I509 Heart failure, unspecified: Secondary | ICD-10-CM

## 2023-11-10 DIAGNOSIS — N189 Chronic kidney disease, unspecified: Secondary | ICD-10-CM | POA: Diagnosis not present

## 2023-11-10 LAB — URINALYSIS, ROUTINE W REFLEX MICROSCOPIC
Bacteria, UA: NONE SEEN
Bilirubin Urine: NEGATIVE
Glucose, UA: NEGATIVE mg/dL
Hgb urine dipstick: NEGATIVE
Ketones, ur: NEGATIVE mg/dL
Leukocytes,Ua: NEGATIVE
Nitrite: NEGATIVE
Protein, ur: 100 mg/dL — AB
Specific Gravity, Urine: 1.013 (ref 1.005–1.030)
pH: 5 (ref 5.0–8.0)

## 2023-11-10 LAB — RAPID URINE DRUG SCREEN, HOSP PERFORMED
Amphetamines: NOT DETECTED
Barbiturates: NOT DETECTED
Benzodiazepines: NOT DETECTED
Cocaine: NOT DETECTED
Opiates: NOT DETECTED
Tetrahydrocannabinol: POSITIVE — AB

## 2023-11-10 LAB — BLOOD GAS, VENOUS
Acid-base deficit: 4.6 mmol/L — ABNORMAL HIGH (ref 0.0–2.0)
Bicarbonate: 22.1 mmol/L (ref 20.0–28.0)
Drawn by: 8327
O2 Saturation: 19 %
Patient temperature: 36.2
pCO2, Ven: 44 mm[Hg] (ref 44–60)
pH, Ven: 7.3 (ref 7.25–7.43)
pO2, Ven: <31 mm[Hg] — CL (ref 32–45)

## 2023-11-10 LAB — CBC
HCT: 41.7 % (ref 39.0–52.0)
Hemoglobin: 13.3 g/dL (ref 13.0–17.0)
MCH: 30.4 pg (ref 26.0–34.0)
MCHC: 31.9 g/dL (ref 30.0–36.0)
MCV: 95.2 fL (ref 80.0–100.0)
Platelets: 406 10*3/uL — ABNORMAL HIGH (ref 150–400)
RBC: 4.38 MIL/uL (ref 4.22–5.81)
RDW: 16.2 % — ABNORMAL HIGH (ref 11.5–15.5)
WBC: 10 10*3/uL (ref 4.0–10.5)
nRBC: 0.2 % (ref 0.0–0.2)

## 2023-11-10 LAB — BASIC METABOLIC PANEL
Anion gap: 19 — ABNORMAL HIGH (ref 5–15)
BUN: 55 mg/dL — ABNORMAL HIGH (ref 6–20)
CO2: 17 mmol/L — ABNORMAL LOW (ref 22–32)
Calcium: 9.6 mg/dL (ref 8.9–10.3)
Chloride: 103 mmol/L (ref 98–111)
Creatinine, Ser: 1.98 mg/dL — ABNORMAL HIGH (ref 0.61–1.24)
GFR, Estimated: 38 mL/min — ABNORMAL LOW (ref >60)
Glucose, Bld: 90 mg/dL (ref 70–99)
Potassium: 4.8 mmol/L (ref 3.5–5.1)
Sodium: 139 mmol/L (ref 135–145)

## 2023-11-10 LAB — GLUCOSE, CAPILLARY: Glucose-Capillary: 118 mg/dL — ABNORMAL HIGH (ref 70–99)

## 2023-11-10 LAB — LACTIC ACID, PLASMA: Lactic Acid, Venous: >9 mmol/L (ref 0.5–1.9)

## 2023-11-10 LAB — AMMONIA: Ammonia: 17 umol/L (ref 9–35)

## 2023-11-10 MED ORDER — FUROSEMIDE 10 MG/ML IJ SOLN
80.0000 mg | Freq: Two times a day (BID) | INTRAMUSCULAR | Status: DC
  Administered 2023-11-10 – 2023-11-11 (×2): 80 mg via INTRAVENOUS
  Filled 2023-11-10 (×2): qty 8

## 2023-11-10 MED ORDER — IOHEXOL 300 MG/ML  SOLN
15.0000 mL | INTRAMUSCULAR | Status: AC
  Administered 2023-11-10 (×2): 15 mL via ORAL

## 2023-11-10 NOTE — Plan of Care (Signed)
  Problem: Education: Goal: Knowledge of General Education information will improve Description: Including pain rating scale, medication(s)/side effects and non-pharmacologic comfort measures Outcome: Progressing   Problem: Activity: Goal: Risk for activity intolerance will decrease Outcome: Progressing   Problem: Coping: Goal: Level of anxiety will decrease Outcome: Progressing   Problem: Safety: Goal: Ability to remain free from injury will improve Outcome: Progressing   Problem: Skin Integrity: Goal: Risk for impaired skin integrity will decrease Outcome: Progressing   Problem: Activity: Goal: Capacity to carry out activities will improve Outcome: Progressing

## 2023-11-10 NOTE — Progress Notes (Signed)
 Chaplain met with Tanda to provide assistance with ACP documentation. Gurinder was not feeling well and had been unable to rest due to difficulty breathing.  He requested prayer, which chaplain provided.  Chaplain also provided education on advance directives.  He wishes to assign his mother and his daughter as PRODUCT MANAGER.  He wants to review the documents when they are present this afternoon.  Chaplain will follow up around 3:00.  [address], Bcc Pager, [phone]

## 2023-11-10 NOTE — Progress Notes (Signed)
 Called to patients room to administer PRN neb.

## 2023-11-10 NOTE — Progress Notes (Signed)
  Daily Progress Note   Patient Name: Dale [name]       Date: 11/10/2023 DOB: 05-31-1961  Age: 63 y.o. MRN#: [id number] Attending Physician: [name], MD Primary Care Physician: Pcp, No Admit Date: 11/07/2023 Length of Stay: 3 days  Discussed care with primary hospitalist today. When going to see patient today, he was sleeping soundly so did not awaken. Per chart review, patient has reported taking his own home lasix  in addition to medications here in the hospital. Now concerns over cardiogenic shock with hypoperfusion. Workup ordered. Cardiology consulted.  Have asked chaplain to follow up with patient regarding ACP documentation completion.  PMT to continue following along with patient's medical journey.   Tinnie Radar, DO Palliative Care Provider PMT # [phone]

## 2023-11-10 NOTE — Plan of Care (Signed)

## 2023-11-10 NOTE — Progress Notes (Signed)
 Patient refusing CT scan at this time. States he cannot lay flat because of his breathing. Dr. Nelson Chimes notified. Patient stated he will let us know when he feels well enough to go to CT.
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
## 2023-11-10 NOTE — Progress Notes (Signed)
 PROGRESS NOTE    Dale [name]  FMW:[id number] DOB: 1961-07-15 DOA: 11/07/2023 PCP: Pcp, No    Brief Narrative:  63 year old with history of alcohol  abuse, CAD, cardiorenal syndrome, CHF with reduced EF, ischemic cardiomyopathy, stage IV CKD, cocaine use, HLD, HTN, morbid obesity, medication noncompliance was recently admitted to the hospital for 20 days for acute respiratory failure complicated by CHF exacerbation and AKI.  Upon admission noted to be in CHF exacerbation again and chest x-ray showing right lower lobe consolidation.  Upon admission patient started on IV antibiotics and diuretics.   Assessment & Plan:  Principal Problem:   Right lower lobe pneumonia Active Problems:   Alcohol  use disorder, moderate, in early remission (HCC)   Essential hypertension   Obesity (BMI 30-39.9)   Cocaine abuse (HCC)   Normochromic anemia   Hyperlipidemia   NSVT (nonsustained ventricular tachycardia) (HCC)   Acute on chronic combined systolic and diastolic HF (heart failure) (HCC)   Coronary artery disease involving native coronary artery of native heart without angina pectoris   Hypokalemia   MDD (major depressive disorder), recurrent severe, without psychosis (HCC)    AKI on CKD stage IIIb Severe metabolic acidosis -Baseline creatinine 1.9.  Admission creatinine 2.14 > 1.59 > 1.98. - Slightly hyperventilating trying to compensate for metabolic acidosis.  Lactic acid greater than 9.0 which I suspect from hypoperfusion in the setting of cardiogenic issues.  Will obtain CT abdomen pelvis to rule out any ischemic bowel.      Acute on chronic combined systolic and diastolic HF (heart failure) (HCC) Bilateral lower extremity swelling Recent echocardiogram in December 2024 showed EF less than 20% with severely reduced LV function, global hypokinesia, grade 2 DD with dilated cardiomyopathy.   BNP significantly elevated.  Recently cardiology team has determined that apparently patient is at  end-stage heart failure with low output with no further options for advanced therapies especially due to his substance abuse issues. -Unfortunately patient is taking his own Lasix .  I am worried that he might be going into cardiogenic shock with hypoperfusion, lactic acidosis and cold feet.  Cardiology team has been consulted.  Wonder if he would benefit from any inotropic (normal BP) vs RHC/COOX to guide further diuresis  Right lower lobe pneumonia Chest x-ray showing right lower lobe consolidation.  Procalcitonin 0.58.  De-escalate vancomycin /cefepime  to Augmentin  for 5 more days to complete total 7-day course.  Hypokalemia - Aggressive repletion.     NSVT (nonsustained ventricular tachycardia) (HCC)  Per documentation recently had V-fib arrest.  Currently on amiodarone  200 mg daily.   Coronary artery disease Continue aspirin  81 mg p.o. daily. Apparently off statin due to EtOH. Unable to tolerate beta-blockers without getting hypotensive.     MDD (major depressive disorder),  recurrent severe, without psychosis (HCC) Continue bedtime mirtazapine .  Klonopin  twice daily.     Essential hypertension On Lasix .  Requiring midodrine      Alcohol  use disorder, moderate, in early remission [facility]) The patient stated he has not been drinking.     Obesity (BMI 30-39.9) Tobacco cessation advised. Nicotine replacement therapy ordered.     Cocaine abuse (HCC) In remission.     Normochromic anemia Monitor hematocrit and hemoglobin.     Hyperlipidemia Off statin. Follow-up with PCP  Per recent documentation it states that patient was appropriate for hospice/comfort care with no further escalation of care especially if his condition deteriorates.  I will reengage palliative care to again help determine goals of care.  Patient has very poor prognosis.  DVT prophylaxis: enoxaparin  (LOVENOX ) injection 40 mg Start: 11/07/23 2200    Code Status: Limited: Do not attempt resuscitation (DNR)  -DNR-LIMITED -Do Not Intubate/DNI  Family Communication:  called [name], no answer.  Status is: Inpatient Remains inpatient appropriate because: Overall poor prognosis due to medication noncompliance, substance abuse history and end-stage heart failure.  Subjective: Patient was hypercapnic this morning with feeling very short of breath.  Lab work showed metabolic acidosis, lactic acid came back elevated.  Apparently patient took multiple pills of his own Lasix  without notifying anyone.  When I saw him at bedside he appeared comfortable but overall agitated.  Answering all the questions appropriately.  Denied any shortness of breath.  I instructed him to stop taking his medications   Examination:  General exam: Appears calm and comfortable  Respiratory system: bibasilar crackles; improved Cardiovascular system: S1 & S2 heard, RRR. No JVD, murmurs, rubs, gallops or clicks. 3+ b/l LE edema.  Gastrointestinal system: Abdomen is nondistended, soft and nontender. No organomegaly or masses felt. Normal bowel sounds heard. Central nervous system: Alert and oriented. No focal neurological deficits. Extremities: Symmetric 5 x 5 power. Skin: No rashes, lesions or ulcers Psychiatry: Judgement and insight appear normal. Mood & affect appropriate.                Diet Orders (From admission, onward)     Start     Ordered   11/07/23 1345  Diet Heart Room service appropriate? Yes; Fluid consistency: Thin; Fluid restriction: 1200 mL Fluid  Diet effective now       Question Answer Comment  Room service appropriate? Yes   Fluid consistency: Thin   Fluid restriction: 1200 mL Fluid      11/07/23 1346            Objective: Vitals:   11/10/23 0411 11/10/23 0709 11/10/23 0747 11/10/23 1000  BP: (!) 140/99  105/73 118/67  Pulse: (!) 107     Resp: 18  (!) 30 (!) 22  Temp:   (!) 96.5 F (35.8 C) (!) 97.5 F (36.4 C)  TempSrc:   Axillary Axillary  SpO2: 93%  100% 100%  Weight:  118.6  kg    Height:        Intake/Output Summary (Last 24 hours) at 11/10/2023 1207 Last data filed at 11/09/2023 1400 Gross per 24 hour  Intake --  Output 200 ml  Net -200 ml   Filed Weights   11/07/23 1833 11/09/23 0244 11/10/23 0709  Weight: 116.4 kg 116.4 kg 118.6 kg    Scheduled Meds:  allopurinol   100 mg Oral Daily   amiodarone   200 mg Oral Daily   amoxicillin -clavulanate  1 tablet Oral Q12H   aspirin  EC  81 mg Oral Daily   clonazePAM   0.5 mg Oral BID   enoxaparin  (LOVENOX ) injection  40 mg Subcutaneous Q24H   iohexol   15 mL Oral Q1 Hr x 2   levalbuterol   0.63 mg Nebulization TID   midodrine   15 mg Oral TID WC   mirtazapine   15 mg Oral QHS   Continuous Infusions:  Nutritional status     Body mass index is 36.47 kg/m.  Data Reviewed:   CBC: Recent Labs  Lab 11/07/23 1142 11/09/23 0615 11/10/23 0528  WBC 9.0 8.0 10.0  NEUTROABS 7.4  --   --   HGB 12.5* 12.2* 13.3  HCT 36.9* 38.4* 41.7  MCV 90.9 93.0 95.2  PLT 378 395 406*   Basic Metabolic Panel: Recent Labs  Lab 11/07/23 1050 11/08/23 0706 11/09/23 0615 11/10/23 0528  NA 137  --  139 139  K 3.5  --  2.8* 4.8  CL 100  --  100 103  CO2 22  --  28 17*  GLUCOSE 94  --  117* 90  BUN 63*  --  55* 55*  CREATININE 2.14*  --  1.59* 1.98*  CALCIUM  9.2  --  9.0 9.6  MG  --  2.4  --   --   PHOS  --   --  3.7  --    GFR: Estimated Creatinine Clearance: 52 mL/min (A) (by C-G formula based on SCr of 1.98 mg/dL (H)). Liver Function Tests: No results for input(s): AST, ALT, ALKPHOS, BILITOT, PROT, ALBUMIN  in the last 168 hours. No results for input(s): LIPASE, AMYLASE in the last 168 hours. Recent Labs  Lab 11/10/23 0837  AMMONIA 17   Coagulation Profile: No results for input(s): INR, PROTIME in the last 168 hours. Cardiac Enzymes: No results for input(s): CKTOTAL, CKMB, CKMBINDEX, TROPONINI in the last 168 hours. BNP (last 3 results) No results for input(s): PROBNP in the  last 8760 hours. HbA1C: No results for input(s): HGBA1C in the last 72 hours. CBG: No results for input(s): GLUCAP in the last 168 hours. Lipid Profile: No results for input(s): CHOL, HDL, LDLCALC, TRIG, CHOLHDL, LDLDIRECT in the last 72 hours. Thyroid Function Tests: No results for input(s): TSH, T4TOTAL, FREET4, T3FREE, THYROIDAB in the last 72 hours. Anemia Panel: No results for input(s): VITAMINB12, FOLATE, FERRITIN, TIBC, IRON, RETICCTPCT in the last 72 hours. Sepsis Labs: Recent Labs  Lab 11/07/23 1050 11/08/23 0706 11/10/23 0837  PROCALCITON 0.58 0.56  --   LATICACIDVEN  --   --  >9.0*    Recent Results (from the past 240 hours)  Resp panel by RT-PCR (RSV, Flu A&B, Covid) Anterior Nasal Swab     Status: None   Collection Time: 11/07/23 11:53 AM   Specimen: Anterior Nasal Swab  Result Value Ref Range Status   SARS Coronavirus 2 by RT PCR NEGATIVE NEGATIVE Final    Comment: (NOTE) SARS-CoV-2 target nucleic acids are NOT DETECTED.  The SARS-CoV-2 RNA is generally detectable in upper respiratory specimens during the acute phase of infection. The lowest concentration of SARS-CoV-2 viral copies this assay can detect is 138 copies/mL. A negative result does not preclude SARS-Cov-2 infection and should not be used as the sole basis for treatment or other patient management decisions. A negative result may occur with  improper specimen collection/handling, submission of specimen other than nasopharyngeal swab, presence of viral mutation(s) within the areas targeted by this assay, and inadequate number of viral copies(<138 copies/mL). A negative result must be combined with clinical observations, patient history, and epidemiological information. The expected result is Negative.  Fact Sheet for Patients:  bloggercourse.com  Fact Sheet for Healthcare Providers:  seriousbroker.it  This  test is no t yet approved or cleared by the [place]  FDA and  has been authorized for detection and/or diagnosis of SARS-CoV-2 by FDA under an Emergency Use Authorization (EUA). This EUA will remain  in effect (meaning this test can be used) for the duration of the COVID-19 declaration under Section 564(b)(1) of the Act, 21 U.S.C.section 360bbb-3(b)(1), unless the authorization is terminated  or revoked sooner.       Influenza A by PCR NEGATIVE NEGATIVE Final   Influenza B by PCR NEGATIVE NEGATIVE Final    Comment: (NOTE) The Xpert Xpress SARS-CoV-2/FLU/RSV plus assay  is intended as an aid in the diagnosis of influenza from Nasopharyngeal swab specimens and should not be used as a sole basis for treatment. Nasal washings and aspirates are unacceptable for Xpert Xpress SARS-CoV-2/FLU/RSV testing.  Fact Sheet for Patients: bloggercourse.com  Fact Sheet for Healthcare Providers: seriousbroker.it  This test is not yet approved or cleared by the [place]  FDA and has been authorized for detection and/or diagnosis of SARS-CoV-2 by FDA under an Emergency Use Authorization (EUA). This EUA will remain in effect (meaning this test can be used) for the duration of the COVID-19 declaration under Section 564(b)(1) of the Act, 21 U.S.C. section 360bbb-3(b)(1), unless the authorization is terminated or revoked.     Resp Syncytial Virus by PCR NEGATIVE NEGATIVE Final    Comment: (NOTE) Fact Sheet for Patients: bloggercourse.com  Fact Sheet for Healthcare Providers: seriousbroker.it  This test is not yet approved or cleared by the [place]  FDA and has been authorized for detection and/or diagnosis of SARS-CoV-2 by FDA under an Emergency Use Authorization (EUA). This EUA will remain in effect (meaning this test can be used) for the duration of the COVID-19 declaration under  Section 564(b)(1) of the Act, 21 U.S.C. section 360bbb-3(b)(1), unless the authorization is terminated or revoked.  Performed at [facility], [address] [place], [place] [address]   MRSA Next Gen by PCR, Nasal     Status: None   Collection Time: 11/08/23  4:11 PM   Specimen: Nasal Mucosa; Nasal Swab  Result Value Ref Range Status   MRSA by PCR Next Gen NOT DETECTED NOT DETECTED Final    Comment: (NOTE) The GeneXpert MRSA Assay (FDA approved for NASAL specimens only), is one component of a comprehensive MRSA colonization surveillance program. It is not intended to diagnose MRSA infection nor to guide or monitor treatment for MRSA infections. Test performance is not FDA approved in patients less than 68 years old. Performed at [facility], [address], [place], [place] [address]          Radiology Studies: [facility] Chest Port 1 View Result Date: 11/10/2023 CLINICAL DATA:  Dyspnea.  Follow-up study. EXAM: PORTABLE CHEST 1 VIEW COMPARISON:  11/07/2023 FINDINGS: Moderate enlargement of the cardiopericardial silhouette, stable from the previous exam. No mediastinal or hilar masses. Mild patchy opacity at the right lung base is similar to the prior study consistent with a small area of pneumonia or atelectasis. Remainder of the lungs is clear. No convincing pleural effusion and no pneumothorax. IMPRESSION: 1. No convincing change from the most recent prior exam. 2. Mild opacity at the right lung base which may reflect atelectasis or pneumonia. 3. Stable enlargement of the cardiopericardial silhouette from the previous study. Consider a pericardial effusion in the proper clinical setting, which would be better assessed with chest CT. Electronically Signed   By: Alm Parkins M.D.   On: 11/10/2023 08:21           LOS: 3 days   Time spent= 35 mins    Lenix Kidd JAYSON Dare, MD [facility]  If 7PM-7AM, please contact  night-coverage  11/10/2023, 12:07 PM

## 2023-11-10 NOTE — Progress Notes (Signed)
   11/10/23 1600  Spiritual Encounters  Type of Visit Follow up  Care provided to: Patient;Pt not available  Reason for visit Advance directives  OnCall Visit No   Chaplain attempted two follow up visits this afternoon and no family were present to assist in completing AD paperwork. Spoke with RN and will continue to followup for finalization of paperwork and attend to [facility] for additional spiritual care needs.     Respectfully submitted,   Rev. Mallie Dennis Molt

## 2023-11-11 ENCOUNTER — Inpatient Hospital Stay (HOSPITAL_COMMUNITY): Payer: MEDICAID

## 2023-11-11 DIAGNOSIS — N189 Chronic kidney disease, unspecified: Secondary | ICD-10-CM | POA: Diagnosis not present

## 2023-11-11 DIAGNOSIS — N179 Acute kidney failure, unspecified: Secondary | ICD-10-CM | POA: Diagnosis not present

## 2023-11-11 DIAGNOSIS — I509 Heart failure, unspecified: Secondary | ICD-10-CM | POA: Diagnosis not present

## 2023-11-11 DIAGNOSIS — J189 Pneumonia, unspecified organism: Secondary | ICD-10-CM | POA: Diagnosis not present

## 2023-11-11 LAB — URINE CULTURE: Culture: NO GROWTH

## 2023-11-11 LAB — CBC
HCT: 35.1 % — ABNORMAL LOW (ref 39.0–52.0)
Hemoglobin: 11.9 g/dL — ABNORMAL LOW (ref 13.0–17.0)
MCH: 30.7 pg (ref 26.0–34.0)
MCHC: 33.9 g/dL (ref 30.0–36.0)
MCV: 90.5 fL (ref 80.0–100.0)
Platelets: 353 10*3/uL (ref 150–400)
RBC: 3.88 MIL/uL — ABNORMAL LOW (ref 4.22–5.81)
RDW: 15.9 % — ABNORMAL HIGH (ref 11.5–15.5)
WBC: 11 10*3/uL — ABNORMAL HIGH (ref 4.0–10.5)
nRBC: 0.4 % — ABNORMAL HIGH (ref 0.0–0.2)

## 2023-11-11 LAB — STREP PNEUMONIAE URINARY ANTIGEN: Strep Pneumo Urinary Antigen: NEGATIVE

## 2023-11-11 LAB — BASIC METABOLIC PANEL
Anion gap: 14 (ref 5–15)
BUN: 71 mg/dL — ABNORMAL HIGH (ref 6–20)
CO2: 23 mmol/L (ref 22–32)
Calcium: 9.4 mg/dL (ref 8.9–10.3)
Chloride: 95 mmol/L — ABNORMAL LOW (ref 98–111)
Creatinine, Ser: 2.43 mg/dL — ABNORMAL HIGH (ref 0.61–1.24)
GFR, Estimated: 30 mL/min — ABNORMAL LOW (ref >60)
Glucose, Bld: 109 mg/dL — ABNORMAL HIGH (ref 70–99)
Potassium: 5.1 mmol/L (ref 3.5–5.1)
Sodium: 132 mmol/L — ABNORMAL LOW (ref 135–145)

## 2023-11-11 LAB — LACTIC ACID, PLASMA
Lactic Acid, Venous: 2.3 mmol/L (ref 0.5–1.9)
Lactic Acid, Venous: 2.4 mmol/L (ref 0.5–1.9)

## 2023-11-11 MED ORDER — LORAZEPAM 2 MG/ML IJ SOLN
0.5000 mg | Freq: Once | INTRAMUSCULAR | Status: AC | PRN
  Administered 2023-11-11: 0.5 mg via INTRAVENOUS
  Filled 2023-11-11: qty 1

## 2023-11-11 MED ORDER — METOLAZONE 5 MG PO TABS
5.0000 mg | ORAL_TABLET | Freq: Once | ORAL | Status: AC
  Administered 2023-11-11: 5 mg via ORAL
  Filled 2023-11-11: qty 1

## 2023-11-11 MED ORDER — FUROSEMIDE 10 MG/ML IJ SOLN
120.0000 mg | Freq: Two times a day (BID) | INTRAVENOUS | Status: DC
  Administered 2023-11-11 – 2023-11-14 (×6): 120 mg via INTRAVENOUS
  Filled 2023-11-11: qty 12
  Filled 2023-11-11 (×2): qty 10
  Filled 2023-11-11: qty 12
  Filled 2023-11-11 (×3): qty 10
  Filled 2023-11-11: qty 2
  Filled 2023-11-11: qty 12

## 2023-11-11 NOTE — Progress Notes (Signed)
 Rounding Note    Patient Name: Dale [name] Date of Encounter: 11/11/2023  [facility] Cardiologist: Ezra Shuck, MD   Subjective   Pt found sitting on edge of bed, nauseous  Inpatient Medications    Scheduled Meds:  allopurinol   100 mg Oral Daily   amiodarone   200 mg Oral Daily   amoxicillin -clavulanate  1 tablet Oral Q12H   aspirin  EC  81 mg Oral Daily   clonazePAM   0.5 mg Oral BID   enoxaparin  (LOVENOX ) injection  40 mg Subcutaneous Q24H   furosemide   80 mg Intravenous Q12H   levalbuterol   0.63 mg Nebulization TID   midodrine   15 mg Oral TID WC   mirtazapine   15 mg Oral QHS   Continuous Infusions:  PRN Meds: acetaminophen  **OR** acetaminophen , hydrALAZINE , ipratropium-albuterol , metoprolol  tartrate, oxyCODONE , prochlorperazine    Vital Signs    Vitals:   11/10/23 2300 11/11/23 0129 11/11/23 0500 11/11/23 0756  BP: 108/77 107/78    Pulse: 98 88    Resp: 20 19    Temp: 98 F (36.7 C) 97.7 F (36.5 C)    TempSrc: Oral     SpO2: 100% 97%  95%  Weight:   121.6 kg   Height:        Intake/Output Summary (Last 24 hours) at 11/11/2023 0923 Last data filed at 11/11/2023 0300 Gross per 24 hour  Intake 1080 ml  Output 500 ml  Net 580 ml      11/11/2023    5:00 AM 11/10/2023    7:09 AM 11/09/2023    2:44 AM  Last 3 Weights  Weight (lbs) 268 lb 1.3 oz 261 lb 7.5 oz 256 lb 9.9 oz  Weight (kg) 121.6 kg 118.6 kg 116.4 kg      Telemetry    Sinus rhythm HR 80s, PVCs and NSVT - Personally Reviewed  ECG    No new tracings - Personally Reviewed  Physical Exam   GEN: No acute distress.   Neck: + JVD Cardiac: RRR, no murmurs, rubs, or gallops.  Respiratory: diminished in bases GI: Soft, nontender, non-distended  MS: now with compression stockings on but still with 2-3+ pitting edema Neuro:  Nonfocal  Psych: Normal affect   Labs    High Sensitivity Troponin:   Recent Labs  Lab 10/13/23 1830 10/13/23 2048 10/24/23 0637 10/24/23 0854   TROPONINIHS 61* 60* 45* 70*     Chemistry Recent Labs  Lab 11/08/23 0706 11/09/23 0615 11/10/23 0528 11/11/23 0519  NA  --  139 139 132*  K  --  2.8* 4.8 5.1  CL  --  100 103 95*  CO2  --  28 17* 23  GLUCOSE  --  117* 90 109*  BUN  --  55* 55* 71*  CREATININE  --  1.59* 1.98* 2.43*  CALCIUM   --  9.0 9.6 9.4  MG 2.4  --   --   --   GFRNONAA  --  49* 38* 30*  ANIONGAP  --  11 19* 14    Lipids No results for input(s): CHOL, TRIG, HDL, LABVLDL, LDLCALC, CHOLHDL in the last 168 hours.  Hematology Recent Labs  Lab 11/09/23 0615 11/10/23 0528 11/11/23 0519  WBC 8.0 10.0 11.0*  RBC 4.13* 4.38 3.88*  HGB 12.2* 13.3 11.9*  HCT 38.4* 41.7 35.1*  MCV 93.0 95.2 90.5  MCH 29.5 30.4 30.7  MCHC 31.8 31.9 33.9  RDW 15.9* 16.2* 15.9*  PLT 395 406* 353   Thyroid No results  for input(s): TSH, FREET4 in the last 168 hours.  BNP Recent Labs  Lab 11/07/23 1142  BNP >4,500.0*    DDimer No results for input(s): DDIMER in the last 168 hours.   Radiology    DG Chest Port 1 View Result Date: 11/10/2023 CLINICAL DATA:  Dyspnea.  Follow-up study. EXAM: PORTABLE CHEST 1 VIEW COMPARISON:  11/07/2023 FINDINGS: Moderate enlargement of the cardiopericardial silhouette, stable from the previous exam. No mediastinal or hilar masses. Mild patchy opacity at the right lung base is similar to the prior study consistent with a small area of pneumonia or atelectasis. Remainder of the lungs is clear. No convincing pleural effusion and no pneumothorax. IMPRESSION: 1. No convincing change from the most recent prior exam. 2. Mild opacity at the right lung base which may reflect atelectasis or pneumonia. 3. Stable enlargement of the cardiopericardial silhouette from the previous study. Consider a pericardial effusion in the proper clinical setting, which would be better assessed with chest CT. Electronically Signed   By: Alm Parkins M.D.   On: 11/10/2023 08:21    Cardiac Studies    10/16/23 TTE  1. Left ventricular ejection fraction, by estimation, is <20%. The left  ventricle has severely decreased function. The left ventricle demonstrates  global hypokinesis. The left ventricular internal cavity size was severely  dilated. Left ventricular  diastolic parameters are consistent with Grade II diastolic dysfunction  (pseudonormalization).   2. Right ventricular systolic function is moderately reduced. The right  ventricular size is severely enlarged. There is normal pulmonary artery  systolic pressure.   3. Left atrial size was severely dilated.   4. Right atrial size was severely dilated.   5. The mitral valve is normal in structure. Mild mitral valve  regurgitation. No evidence of mitral stenosis.   6. The aortic valve is tricuspid. Aortic valve regurgitation is trivial.  No aortic stenosis is present.   7. The inferior vena cava is dilated in size with >50% respiratory  variability, suggesting right atrial pressure of 8 mmHg.    03/18/22 LHC/RHC     Prox LAD lesion is 15% stenosed.   Mid LAD lesion is 40% stenosed.   Prox RCA lesion is 100% stenosed.   2nd Mrg lesion is 30% stenosed.   3rd Mrg lesion is 20% stenosed.   1. No change in coronaries compared to prior cath, occluded proximal RCA with collaterals, nonobstructive disease in the left system.  2. Markedly elevated filling pressures.  3. Pulmonary venous hypertension.  4. Low cardiac output.    Diagnostic Dominance: Right    Patient Profile     63 y.o. male with a hx of hypertension, polysubstance abuse with cocaine, alcohol , mixed NICM/ICM with chronic HFrEF, CAD, CKD IIIb-IV, hyperlipidemia who is being seen for the evaluation of CHF   Assessment & Plan    Acute on chronic systolic and diastolic heart failure with moderately reduce RV function LVEF < 20% on echo 10/2023, grade 2 DD, RV dysfunction. Recent admission for CHF complicated by VF arrest. Management has been complicated by  hypotension, renal insufficiency, and complex social situation. - currently on 15 mg midodrine  TID - renal function declining - sCr now 2.43 (1.9) with K 5.1 - SBP in the 100-110 range - lactic acid greater than 9 yesterday, now improved to 2.3 - CO2 23 - not a candidate for advanced CHF therapies - resumed 80 mg lasix  yesterday (he took his own supply from home yesterday, now removed from his room) - 80  mg IV lasix  BID with in complete I&Os - weight continues to increase - 268 lbs from 256 lbs at admission - agree with diuresis as tolerated   Hx of Vfib arrest, NSVT - continue 200 mg amiodarone  - not a candidate for ICD   CAD CTO RCA with collateral flow.  - continue ASA, no statin given ETOH abuse, now reports remission     For questions or updates, please contact [facility] Please consult [email or web address] for contact info under        Signed, Jon Nat Hails, PA  11/11/2023, 9:23 AM

## 2023-11-11 NOTE — TOC Progression Note (Addendum)
 Transition of Care [facility]) - Progression Note    Patient Details  Name: Dale [name] MRN: [id number] Date of Birth: 09-14-1961  [facility] CM/SW Contact  Hoy DELENA Bigness, LCSW Phone Number: 11/11/2023, 3:31 PM  Clinical Narrative:    Spoke with [name] who share pt is active with their homeless services and is in the process of being enrolled into their permanent housing program. Pt does not have any further VA benefits and is not eligible for further services. Pt is on waitlist for transitional housing at the [facility] however, pt is 4th veteran on the waitlist. Pt suggested to stay at [facility] shelter in mean time if bed available. TOC continuing to follow for discharge planning.    Expected Discharge Plan:  (TBD) Barriers to Discharge: Continued Medical Work up, Homeless with medical needs, Unsafe home situation  Expected Discharge Plan and Services In-house Referral: Clinical Social Work Discharge Planning Services: NA Post Acute Care Choice: NA Living arrangements for the past 2 months: No permanent address                                       Social Determinants of Health (SDOH) Interventions SDOH Screenings   Food Insecurity: Food Insecurity Present (11/07/2023)  Housing: High Risk (11/07/2023)  Transportation Needs: Unmet Transportation Needs (11/07/2023)  Utilities: Not At Risk (11/07/2023)  Alcohol  Screen: High Risk (02/20/2022)  Depression (PHQ2-9): Medium Risk (05/05/2019)  Financial Resource Strain: High Risk (08/25/2023)  Tobacco Use: Low Risk  (11/07/2023)    Readmission Risk Interventions    11/10/2023    3:36 PM 10/20/2023    3:32 PM 10/19/2023    2:46 PM  Readmission Risk Prevention Plan  Transportation Screening Complete Complete Complete  Medication Review [employer]) Complete Complete   PCP or Specialist appointment within 3-5 days of discharge Complete Complete --  HRI or Home Care Consult  Complete Complete Not Complete  HRI or Home Care Consult Pt Refusal Comments   Homeless  SW Recovery Care/Counseling Consult Complete Complete Complete  Palliative Care Screening Complete Complete   Skilled Nursing Facility Not Applicable Not Applicable

## 2023-11-11 NOTE — Progress Notes (Signed)
 PROGRESS NOTE    Dale [name]  FMW:[id number] DOB: Nov 28, 1960 DOA: 11/07/2023 PCP: Pcp, No    Brief Narrative:  63 year old with history of alcohol  abuse, CAD, cardiorenal syndrome, CHF with reduced EF, ischemic cardiomyopathy, stage IV CKD, cocaine use, HLD, HTN, morbid obesity, medication noncompliance was recently admitted to the hospital for 20 days for acute respiratory failure complicated by CHF exacerbation and AKI.  Upon admission noted to be in CHF exacerbation again and chest x-ray showing right lower lobe consolidation.  Upon admission patient started on IV antibiotics and diuretics.  Initially felt better thereafter complicated by AKI and acidosis.  Seen by cardiology but unfortunately does not have much options at this point.   Assessment & Plan:  Principal Problem:   Right lower lobe pneumonia Active Problems:   Alcohol  use disorder, moderate, in early remission (HCC)   Essential hypertension   Obesity (BMI 30-39.9)   Cocaine abuse (HCC)   Normochromic anemia   Hyperlipidemia   NSVT (nonsustained ventricular tachycardia) (HCC)   Acute on chronic combined systolic and diastolic HF (heart failure) (HCC)   Coronary artery disease involving native coronary artery of native heart without angina pectoris   Hypokalemia   MDD (major depressive disorder), recurrent severe, without psychosis (HCC)    AKI on CKD stage IIIb Severe metabolic acidosis -Baseline creatinine 1.9.  Admission creatinine 2.14 > 1.59 > 1.98 >2.43.  Bicarb slowly improving.  Will repeat lactic acid.  Suspect a lot of this has to do with hypoperfusion of his tissues in the setting of advanced heart failure.  Unfortunately but does not have much options for medical therapy.  Patient has been quite noncompliant and very difficult during this hospitalization in terms of his medical care.  He has been refusing CT abdomen pelvis despite of telling him the importance of it.      Acute on chronic combined  systolic and diastolic HF (heart failure) (HCC) Bilateral lower extremity swelling Recent echocardiogram in December 2024 showed EF less than 20% with severely reduced LV function, global hypokinesia, grade 2 DD with dilated cardiomyopathy.   Recent cardiology notes reviewed.  Also discussed with cardiology team during this admission.  Unfortunately very limited medical options and no advanced therapy options at this point.  Patient remains difficult and continue to at time hinder his medical care during this hospitalization with his noncompliance and refusing various testing.  Nausea vomiting - X-rays negative.  Suspect from bowel wall edema.  Antiemetics as needed continue diuresis  Right lower lobe pneumonia Chest x-ray showing right lower lobe consolidation.  Procalcitonin 0.58.  De-escalate vancomycin /cefepime  to Augmentin  for 5 more days to complete total 7-day course.  Hypokalemia - Aggressive repletion.     NSVT (nonsustained ventricular tachycardia) (HCC)  Per documentation recently had V-fib arrest.  Currently on amiodarone  200 mg daily.   Coronary artery disease Continue aspirin  81 mg p.o. daily. Apparently off statin due to EtOH. Unable to tolerate beta-blockers without getting hypotensive.     MDD (major depressive disorder),  recurrent severe, without psychosis (HCC) Continue bedtime mirtazapine .  Klonopin  twice daily.     Essential hypertension On Lasix .  Requiring midodrine      Alcohol  use disorder, moderate, in early remission [facility]) The patient stated he has not been drinking.     Obesity (BMI 30-39.9) Tobacco cessation advised. Nicotine replacement therapy ordered.     Cocaine abuse (HCC) In remission.     Normochromic anemia Monitor hematocrit and hemoglobin.     Hyperlipidemia Off  statin. Follow-up with PCP  Hospice/comfort care has been recommended.  Currently patient is DNR.  Has been seen by palliative care team.  DVT prophylaxis: enoxaparin   (LOVENOX ) injection 40 mg Start: 11/07/23 2200    Code Status: Limited: Do not attempt resuscitation (DNR) -DNR-LIMITED -Do Not Intubate/DNI  Family Communication:  called [name], no answer.  Status is: Inpatient Remains inpatient appropriate because: Overall poor prognosis due to medication noncompliance, substance abuse history and end-stage heart failure.  Subjective: Refusing CT scan.  Has been taking his own medications and he has been advised against it.. This morning sitting up in the side of the bed having some nausea.  Abdominal x-ray is negative.  I suspect this is some bowel wall edema.  Examination:  General exam: Appears calm and comfortable  Respiratory system: bibasilar crackles; improved Cardiovascular system: S1 & S2 heard, RRR. No JVD, murmurs, rubs, gallops or clicks. 3+ b/l LE edema.  Gastrointestinal system: Abdomen is nondistended, soft and nontender. No organomegaly or masses felt. Normal bowel sounds heard. Central nervous system: Alert and oriented. No focal neurological deficits. Extremities: Symmetric 5 x 5 power. Skin: No rashes, lesions or ulcers Psychiatry: Judgement and insight appear normal. Mood & affect appropriate.                Diet Orders (From admission, onward)     Start     Ordered   11/07/23 1345  Diet Heart Room service appropriate? Yes; Fluid consistency: Thin; Fluid restriction: 1200 mL Fluid  Diet effective now       Question Answer Comment  Room service appropriate? Yes   Fluid consistency: Thin   Fluid restriction: 1200 mL Fluid      11/07/23 1346            Objective: Vitals:   11/10/23 2300 11/11/23 0129 11/11/23 0500 11/11/23 0756  BP: 108/77 107/78    Pulse: 98 88    Resp: 20 19    Temp: 98 F (36.7 C) 97.7 F (36.5 C)    TempSrc: Oral     SpO2: 100% 97%  95%  Weight:   121.6 kg   Height:        Intake/Output Summary (Last 24 hours) at 11/11/2023 1205 Last data filed at 11/11/2023 0300 Gross per 24 hour   Intake 1080 ml  Output 300 ml  Net 780 ml   Filed Weights   11/09/23 0244 11/10/23 0709 11/11/23 0500  Weight: 116.4 kg 118.6 kg 121.6 kg    Scheduled Meds:  allopurinol   100 mg Oral Daily   amiodarone   200 mg Oral Daily   amoxicillin -clavulanate  1 tablet Oral Q12H   aspirin  EC  81 mg Oral Daily   clonazePAM   0.5 mg Oral BID   enoxaparin  (LOVENOX ) injection  40 mg Subcutaneous Q24H   furosemide   80 mg Intravenous Q12H   levalbuterol   0.63 mg Nebulization TID   midodrine   15 mg Oral TID WC   mirtazapine   15 mg Oral QHS   Continuous Infusions:  Nutritional status     Body mass index is 37.39 kg/m.  Data Reviewed:   CBC: Recent Labs  Lab 11/07/23 1142 11/09/23 0615 11/10/23 0528 11/11/23 0519  WBC 9.0 8.0 10.0 11.0*  NEUTROABS 7.4  --   --   --   HGB 12.5* 12.2* 13.3 11.9*  HCT 36.9* 38.4* 41.7 35.1*  MCV 90.9 93.0 95.2 90.5  PLT 378 395 406* 353   Basic Metabolic Panel: Recent Labs  Lab 11/07/23 1050 11/08/23 0706 11/09/23 0615 11/10/23 0528 11/11/23 0519  NA 137  --  139 139 132*  K 3.5  --  2.8* 4.8 5.1  CL 100  --  100 103 95*  CO2 22  --  28 17* 23  GLUCOSE 94  --  117* 90 109*  BUN 63*  --  55* 55* 71*  CREATININE 2.14*  --  1.59* 1.98* 2.43*  CALCIUM  9.2  --  9.0 9.6 9.4  MG  --  2.4  --   --   --   PHOS  --   --  3.7  --   --    GFR: Estimated Creatinine Clearance: 42.9 mL/min (A) (by C-G formula based on SCr of 2.43 mg/dL (H)). Liver Function Tests: No results for input(s): AST, ALT, ALKPHOS, BILITOT, PROT, ALBUMIN  in the last 168 hours. No results for input(s): LIPASE, AMYLASE in the last 168 hours. Recent Labs  Lab 11/10/23 0837  AMMONIA 17   Coagulation Profile: No results for input(s): INR, PROTIME in the last 168 hours. Cardiac Enzymes: No results for input(s): CKTOTAL, CKMB, CKMBINDEX, TROPONINI in the last 168 hours. BNP (last 3 results) No results for input(s): PROBNP in the last 8760  hours. HbA1C: No results for input(s): HGBA1C in the last 72 hours. CBG: Recent Labs  Lab 11/10/23 2238  GLUCAP 118*   Lipid Profile: No results for input(s): CHOL, HDL, LDLCALC, TRIG, CHOLHDL, LDLDIRECT in the last 72 hours. Thyroid Function Tests: No results for input(s): TSH, T4TOTAL, FREET4, T3FREE, THYROIDAB in the last 72 hours. Anemia Panel: No results for input(s): VITAMINB12, FOLATE, FERRITIN, TIBC, IRON, RETICCTPCT in the last 72 hours. Sepsis Labs: Recent Labs  Lab 11/07/23 1050 11/08/23 0706 11/10/23 0837 11/11/23 0838  PROCALCITON 0.58 0.56  --   --   LATICACIDVEN  --   --  >9.0* 2.3*    Recent Results (from the past 240 hours)  Resp panel by RT-PCR (RSV, Flu A&B, Covid) Anterior Nasal Swab     Status: None   Collection Time: 11/07/23 11:53 AM   Specimen: Anterior Nasal Swab  Result Value Ref Range Status   SARS Coronavirus 2 by RT PCR NEGATIVE NEGATIVE Final    Comment: (NOTE) SARS-CoV-2 target nucleic acids are NOT DETECTED.  The SARS-CoV-2 RNA is generally detectable in upper respiratory specimens during the acute phase of infection. The lowest concentration of SARS-CoV-2 viral copies this assay can detect is 138 copies/mL. A negative result does not preclude SARS-Cov-2 infection and should not be used as the sole basis for treatment or other patient management decisions. A negative result may occur with  improper specimen collection/handling, submission of specimen other than nasopharyngeal swab, presence of viral mutation(s) within the areas targeted by this assay, and inadequate number of viral copies(<138 copies/mL). A negative result must be combined with clinical observations, patient history, and epidemiological information. The expected result is Negative.  Fact Sheet for Patients:  bloggercourse.com  Fact Sheet for Healthcare Providers:   seriousbroker.it  This test is no t yet approved or cleared by the [place]  FDA and  has been authorized for detection and/or diagnosis of SARS-CoV-2 by FDA under an Emergency Use Authorization (EUA). This EUA will remain  in effect (meaning this test can be used) for the duration of the COVID-19 declaration under Section 564(b)(1) of the Act, 21 U.S.C.section 360bbb-3(b)(1), unless the authorization is terminated  or revoked sooner.       Influenza A by PCR  NEGATIVE NEGATIVE Final   Influenza B by PCR NEGATIVE NEGATIVE Final    Comment: (NOTE) The Xpert Xpress SARS-CoV-2/FLU/RSV plus assay is intended as an aid in the diagnosis of influenza from Nasopharyngeal swab specimens and should not be used as a sole basis for treatment. Nasal washings and aspirates are unacceptable for Xpert Xpress SARS-CoV-2/FLU/RSV testing.  Fact Sheet for Patients: bloggercourse.com  Fact Sheet for Healthcare Providers: seriousbroker.it  This test is not yet approved or cleared by the [place]  FDA and has been authorized for detection and/or diagnosis of SARS-CoV-2 by FDA under an Emergency Use Authorization (EUA). This EUA will remain in effect (meaning this test can be used) for the duration of the COVID-19 declaration under Section 564(b)(1) of the Act, 21 U.S.C. section 360bbb-3(b)(1), unless the authorization is terminated or revoked.     Resp Syncytial Virus by PCR NEGATIVE NEGATIVE Final    Comment: (NOTE) Fact Sheet for Patients: bloggercourse.com  Fact Sheet for Healthcare Providers: seriousbroker.it  This test is not yet approved or cleared by the [place]  FDA and has been authorized for detection and/or diagnosis of SARS-CoV-2 by FDA under an Emergency Use Authorization (EUA). This EUA will remain in effect (meaning this test can be used) for  the duration of the COVID-19 declaration under Section 564(b)(1) of the Act, 21 U.S.C. section 360bbb-3(b)(1), unless the authorization is terminated or revoked.  Performed at [facility], [address], [place], [place] [address]   MRSA Next Gen by PCR, Nasal     Status: None   Collection Time: 11/08/23  4:11 PM   Specimen: Nasal Mucosa; Nasal Swab  Result Value Ref Range Status   MRSA by PCR Next Gen NOT DETECTED NOT DETECTED Final    Comment: (NOTE) The GeneXpert MRSA Assay (FDA approved for NASAL specimens only), is one component of a comprehensive MRSA colonization surveillance program. It is not intended to diagnose MRSA infection nor to guide or monitor treatment for MRSA infections. Test performance is not FDA approved in patients less than 72 years old. Performed at [facility], [address], [place], [place] [address]   Urine Culture (for pregnant, neutropenic or urologic patients or patients with an indwelling urinary catheter)     Status: None   Collection Time: 11/10/23 11:12 AM   Specimen: Urine, Clean Catch  Result Value Ref Range Status   Specimen Description   Final    URINE, CLEAN CATCH Performed at [facility], [address], [place], [place] [address]    Special Requests   Final    NONE Performed at [facility], [address] [place], [place] [address]    Culture   Final    NO GROWTH Performed at [facility], [address] [place], [place] [address]    Report Status 11/11/2023 FINAL  Final         Radiology Studies: DG Abd 1 View Result Date: 11/11/2023 CLINICAL DATA:  Nausea, vomiting. EXAM: ABDOMEN - 1 VIEW COMPARISON:  October 24, 2023. FINDINGS: No abnormal bowel dilatation is noted. Contrast is noted in nondilated colon. IMPRESSION: No abnormal bowel dilatation. Electronically Signed   By: Lynwood Landy Raddle M.D.   On: 11/11/2023 10:14   DG Chest Port 1  View Result Date: 11/10/2023 CLINICAL DATA:  Dyspnea.  Follow-up study. EXAM: PORTABLE CHEST 1 VIEW COMPARISON:  11/07/2023 FINDINGS: Moderate enlargement of the cardiopericardial silhouette, stable from the previous exam. No mediastinal or  hilar masses. Mild patchy opacity at the right lung base is similar to the prior study consistent with a small area of pneumonia or atelectasis. Remainder of the lungs is clear. No convincing pleural effusion and no pneumothorax. IMPRESSION: 1. No convincing change from the most recent prior exam. 2. Mild opacity at the right lung base which may reflect atelectasis or pneumonia. 3. Stable enlargement of the cardiopericardial silhouette from the previous study. Consider a pericardial effusion in the proper clinical setting, which would be better assessed with chest CT. Electronically Signed   By: Alm Parkins M.D.   On: 11/10/2023 08:21           LOS: 4 days   Time spent= 35 mins    Tanya Marvin JAYSON Dare, MD [facility]  If 7PM-7AM, please contact night-coverage  11/11/2023, 12:05 PM

## 2023-11-11 NOTE — Plan of Care (Signed)
  Problem: Nutrition: Goal: Adequate nutrition will be maintained Outcome: Progressing   Problem: Cardiac: Goal: Ability to achieve and maintain adequate cardiopulmonary perfusion will improve Outcome: Progressing   Problem: Education: Goal: Knowledge of General Education information will improve Description: Including pain rating scale, medication(s)/side effects and non-pharmacologic comfort measures Outcome: Progressing   Problem: Health Behavior/Discharge Planning: Goal: Ability to manage health-related needs will improve Outcome: Progressing   Problem: Clinical Measurements: Goal: Will remain free from infection Outcome: Progressing

## 2023-11-12 DIAGNOSIS — I509 Heart failure, unspecified: Secondary | ICD-10-CM | POA: Diagnosis not present

## 2023-11-12 DIAGNOSIS — F1021 Alcohol dependence, in remission: Secondary | ICD-10-CM | POA: Diagnosis not present

## 2023-11-12 DIAGNOSIS — J189 Pneumonia, unspecified organism: Secondary | ICD-10-CM | POA: Diagnosis not present

## 2023-11-12 LAB — CBC
HCT: 42.2 % (ref 39.0–52.0)
Hemoglobin: 14.4 g/dL (ref 13.0–17.0)
MCH: 30 pg (ref 26.0–34.0)
MCHC: 34.1 g/dL (ref 30.0–36.0)
MCV: 87.9 fL (ref 80.0–100.0)
Platelets: 227 10*3/uL (ref 150–400)
RBC: 4.8 MIL/uL (ref 4.22–5.81)
RDW: 16.2 % — ABNORMAL HIGH (ref 11.5–15.5)
WBC: 9.6 10*3/uL (ref 4.0–10.5)
nRBC: 0 % (ref 0.0–0.2)

## 2023-11-12 LAB — BASIC METABOLIC PANEL
Anion gap: 15 (ref 5–15)
BUN: 60 mg/dL — ABNORMAL HIGH (ref 6–20)
CO2: 27 mmol/L (ref 22–32)
Calcium: 8.9 mg/dL (ref 8.9–10.3)
Chloride: 91 mmol/L — ABNORMAL LOW (ref 98–111)
Creatinine, Ser: 1.96 mg/dL — ABNORMAL HIGH (ref 0.61–1.24)
GFR, Estimated: 38 mL/min — ABNORMAL LOW (ref >60)
Glucose, Bld: 97 mg/dL (ref 70–99)
Potassium: 3.3 mmol/L — ABNORMAL LOW (ref 3.5–5.1)
Sodium: 133 mmol/L — ABNORMAL LOW (ref 135–145)

## 2023-11-12 MED ORDER — POTASSIUM CHLORIDE CRYS ER 20 MEQ PO TBCR
40.0000 meq | EXTENDED_RELEASE_TABLET | Freq: Three times a day (TID) | ORAL | Status: AC
  Administered 2023-11-12 (×3): 40 meq via ORAL
  Filled 2023-11-12 (×3): qty 2

## 2023-11-12 MED ORDER — PANTOPRAZOLE SODIUM 40 MG PO TBEC
40.0000 mg | DELAYED_RELEASE_TABLET | Freq: Every day | ORAL | Status: DC
  Administered 2023-11-12 – 2023-11-14 (×3): 40 mg via ORAL
  Filled 2023-11-12 (×3): qty 1

## 2023-11-12 NOTE — Evaluation (Signed)
 Physical Therapy Evaluation Patient Details Name: Dale [name] MRN: [id number] DOB: [birth year] Today's Date: 11/12/2023  History of Present Illness  63 yo male presents to therapy following hospital admission on 11/06/2022 due to progressive SOB, B LE edema and volume overload as well as suicidal ideation. Pt was found to have PNA and CHF exacerbation. Pt was recently hospitalized in Dec 2024 for 20 days due to CHF, cardiogenic shock, AKI, VF arrest, suicidal ideation and required mechanical ventilation. Pt PMH includes but is not limited to: substance abuse, CAD s/p cath, s and dCHF, CKD III, HLD, HTN, cardiomyopathy, PTSD, depression, GERD, and mixed NICM/ICM.  Clinical Impression   Pt admitted with above diagnosis.  Pt currently with functional limitations due to the deficits listed below (see PT Problem List). Pt in bed when PT arrived. Pt agreeable to therapy intervention. Pt indicated mild sore throat due to episodes of vomiting bile, no reports of N or V during PT eval. Pt indicated mild SOB at rest and on RA 93%. Pt required increased time and use of hospital bed and is mod I for supine to sit, pt required S and min cues for transfers, S and min cues for cervical extension with gait tasks 55 feet with RW. Pt left seated in recliner, all needs in place. Pt will benefit from acute skilled PT to increase their independence and safety with mobility to allow discharge.         If plan is discharge home, recommend the following: A little help with walking and/or transfers;A little help with bathing/dressing/bathroom;Assistance with cooking/housework;Assist for transportation;Help with stairs or ramp for entrance   Can travel by private vehicle        Equipment Recommendations Rolling walker (2 wheels) (if pt doesn't still have one)  Recommendations for Other Services       Functional Status Assessment Patient has had a recent decline in their functional status and demonstrates the  ability to make significant improvements in function in a reasonable and predictable amount of time.     Precautions / Restrictions Precautions Precautions: Fall Precaution Comments: monitor O2 Restrictions Weight Bearing Restrictions Per Provider Order: No      Mobility  Bed Mobility Overal bed mobility: Modified Independent                  Transfers Overall transfer level: Needs assistance Equipment used: Rolling walker (2 wheels) Transfers: Sit to/from Stand Sit to Stand: Supervision           General transfer comment: min cues    Ambulation/Gait Ambulation/Gait assistance: Supervision Gait Distance (Feet): 55 Feet Assistive device: Rolling walker (2 wheels) Gait Pattern/deviations: Decreased stride length, Step-to pattern, Narrow base of support Gait velocity: decreased     General Gait Details: cues for cervical extension and pursed lip breathing throughout eval, signs and symtoms of SOB with O2 saturation 92% on RA s/p gait  Stairs            Wheelchair Mobility     Tilt Bed    Modified Rankin (Stroke Patients Only)       Balance Overall balance assessment: Mild deficits observed, not formally tested                                           Pertinent Vitals/Pain Pain Assessment Pain Assessment: Faces Faces Pain Scale: Hurts a little bit Pain Location:  throat Pain Descriptors / Indicators: Burning, Constant, Discomfort Pain Intervention(s): Monitored during session    Home Living Family/patient expects to be discharged to:: Shelter/Homeless Living Arrangements: Non-relatives/Friends                      Prior Function Prior Level of Function : Independent/Modified Independent             Mobility Comments: RW for mobility, mod I for ADLs and self care tasks with increased time due to SOB and fatigue       Extremity/Trunk Assessment        Lower Extremity Assessment Lower Extremity  Assessment: Generalized weakness    Cervical / Trunk Assessment Cervical / Trunk Assessment: Normal  Communication   Communication Communication: No apparent difficulties  Cognition Arousal: Alert Behavior During Therapy: WFL for tasks assessed/performed Overall Cognitive Status: Within Functional Limits for tasks assessed                                          General Comments General comments (skin integrity, edema, etc.): B LE edema, pitting  on dorsal surface of B feet    Exercises     Assessment/Plan    PT Assessment Patient needs continued PT services  PT Problem List Decreased mobility;Decreased activity tolerance;Decreased balance       PT Treatment Interventions DME instruction;Gait training;Functional mobility training;Therapeutic activities;Therapeutic exercise;Patient/family education;Balance training;Neuromuscular re-education    PT Goals (Current goals can be found in the Care Plan section)  Acute Rehab PT Goals Patient Stated Goal: to be able to do more for myself and breathe better PT Goal Formulation: With patient Time For Goal Achievement: 11/26/23 Potential to Achieve Goals: Good    Frequency Min 1X/week     Co-evaluation               AM-PAC PT 6 Clicks Mobility  Outcome Measure Help needed turning from your back to your side while in a flat bed without using bedrails?: None Help needed moving from lying on your back to sitting on the side of a flat bed without using bedrails?: None Help needed moving to and from a bed to a chair (including a wheelchair)?: A Little Help needed standing up from a chair using your arms (e.g., wheelchair or bedside chair)?: A Little Help needed to walk in hospital room?: A Little Help needed climbing 3-5 steps with a railing? : A Little 6 Click Score: 20    End of Session Equipment Utilized During Treatment: Gait belt Activity Tolerance: Patient limited by fatigue Patient left: in  chair;with call bell/phone within reach Nurse Communication: Mobility status (B LE edema) PT Visit Diagnosis: Unsteadiness on feet (R26.81);Other abnormalities of gait and mobility (R26.89);Muscle weakness (generalized) (M62.81);Difficulty in walking, not elsewhere classified (R26.2)    Time: 8882-8854 PT Time Calculation (min) (ACUTE ONLY): 28 min   Charges:   PT Evaluation $PT Eval Low Complexity: 1 Low PT Treatments $Gait Training: 8-22 mins PT General Charges $$ ACUTE PT VISIT: 1 Visit         [place], PT Acute Rehab   [place] VEAR Drone 11/12/2023, 12:46 PM

## 2023-11-12 NOTE — Plan of Care (Signed)
 Pt's N/V relieved by 1x dose of ativan . Problem: Education: Goal: Knowledge of General Education information will improve Description: Including pain rating scale, medication(s)/side effects and non-pharmacologic comfort measures Outcome: Not Progressing   Problem: Health Behavior/Discharge Planning: Goal: Ability to manage health-related needs will improve Outcome: Not Progressing   Problem: Clinical Measurements: Goal: Ability to maintain clinical measurements within normal limits will improve Outcome: Not Progressing Goal: Will remain free from infection Outcome: Not Progressing Goal: Diagnostic test results will improve Outcome: Not Progressing Goal: Respiratory complications will improve Outcome: Not Progressing Goal: Cardiovascular complication will be avoided Outcome: Not Progressing   Problem: Activity: Goal: Risk for activity intolerance will decrease Outcome: Not Progressing   Problem: Nutrition: Goal: Adequate nutrition will be maintained Outcome: Not Progressing   Problem: Coping: Goal: Level of anxiety will decrease Outcome: Not Progressing   Problem: Elimination: Goal: Will not experience complications related to bowel motility Outcome: Not Progressing Goal: Will not experience complications related to urinary retention Outcome: Not Progressing   Problem: Pain Management: Goal: General experience of comfort will improve Outcome: Not Progressing   Problem: Safety: Goal: Ability to remain free from injury will improve Outcome: Not Progressing   Problem: Skin Integrity: Goal: Risk for impaired skin integrity will decrease Outcome: Not Progressing   Problem: Education: Goal: Ability to demonstrate management of disease process will improve Outcome: Not Progressing Goal: Ability to verbalize understanding of medication therapies will improve Outcome: Not Progressing Goal: Individualized Educational Video(s) Outcome: Not Progressing   Problem:  Activity: Goal: Capacity to carry out activities will improve Outcome: Not Progressing   Problem: Cardiac: Goal: Ability to achieve and maintain adequate cardiopulmonary perfusion will improve Outcome: Not Progressing

## 2023-11-12 NOTE — Progress Notes (Signed)
 Patient Name: Dale [name] Date of Encounter: 11/12/2023 [facility] Cardiologist: Ezra Shuck, MD   Interval Summary  .    63 y.o. male with a hx of HTN, polysubstance abuse with cocaine, GERD, ETOH abuse, mixed NICM/ICM, chronic HFrEF, CAD, CKD stage III-IV, hyperlipidemia, depression,  who is followed by cardiology for acute on chronic systolic heart failure. He has hx of recurrent hospitalization for CHF due to non-compliance. He was deemed not candidate for ICD or advanced therapy due to active ETOH abuse, non-compliance, and difficult social situation. Last admitted in Dec 2024 for 20 days for CHF, complicated by cardiogenic shock, AKI, VF arrest, mechanical ventilation, and suicidal ideation. GDMT has been limited by hypotension, renal disease. He is now returned for acute CHF since 11/06/22, also receiving treatment for pneumonia at this time.   Patient is sleepy this morning, opened eyes to loud voice. He states he feels OK, no worsening SOB. He can't elaborate history. He states he has some family. He states he is aware of his cardiac situation. Nursing expressed concern of ETOH withdrawal.   Vital Signs .    Vitals:   11/11/23 1419 11/11/23 1948 11/12/23 0347 11/12/23 0500  BP:  98/78 102/69   Pulse:  91 86   Resp:  19 19   Temp:  98 F (36.7 C) 98 F (36.7 C)   TempSrc:      SpO2: 98% 100% 100%   Weight:    120.5 kg  Height:        Intake/Output Summary (Last 24 hours) at 11/12/2023 0838 Last data filed at 11/12/2023 0700 Gross per 24 hour  Intake 424.36 ml  Output 2250 ml  Net -1825.64 ml      11/12/2023    5:00 AM 11/11/2023    5:00 AM 11/10/2023    7:09 AM  Last 3 Weights  Weight (lbs) 265 lb 10.5 oz 268 lb 1.3 oz 261 lb 7.5 oz  Weight (kg) 120.5 kg 121.6 kg 118.6 kg      Telemetry/ECG    He is not on telemetry, available strip suggest sinus rhythm with rare PVC - Personally Reviewed  Physical Exam .   GEN: No acute distress.   Neck: Difficult  assess JVD due to position and he was not moving for exam  Cardiac: RRR, no murmurs, rubs, or gallops.  Respiratory: rhonchi noted of base bilaterally, on room air  GI: Soft, nontender, non-distended  MS: BLE 1+ edema, TEDs in place   Assessment & Plan .     Acute on chronic systolic and diastolic heart failure  - returned 11/06/22 for SOB with running, recently 20 days hospitalization for cardiogenic shock that was complicated by AKI, ventilator dependent respiratory failure, VF arrest, and suicidal ideation  - BNP >4500 (was 744 on 10/18/23), lactic acid >9, Cr 2.14 with GFR 35,  POA - CXR with right lower lobe consolidation, procal 0.58, POA - Echo last 10/16/23 with LVEF <20%, global HPK, severely dilated LV, grade II DD, mod reduced RV, severely enlarged RV, normal PASP, severe LAE and RAE, mild MR, trivial AI  - Case has been reviewed with AHF team by MD, he remains not a candidate for ICD/advanced therapy due to active ETOH abuse, non-compliance, and poor social situation, please continue ongoing goals of care discussion and symptoms management  - Lasix  has been increased to 120mg  BID since 11/10/22, along with a dose of metolazone  on 11/10/22, UOP not accurate, weight 256>268>265ibs, Cr improving from  2.43 >>1.96 today, lactic acid had downtrended to 2.4 from >9, would continue current dose of lasix  for diuresis, trend daily BMP, weight, and accurate I&O - GDMT: historically limited due to hypotension requiring midodrine  and advanced renal disease (attempted digoxin , spironolactone , entresto , bidil , and farxiga ); BP remains low normal, AKI improving, will not add additional therapy at this time   Hx of VF arrest 10/2023 - continue amiodarone  - not candidate for ICD   CAD - medically managed, continue ASA 81mg  daily, may add statin if patient wishes at time of discharge  AKI on CKD IIIb Pneumonia  Anxiety with depression Hypotension  ETOH /THC abuse  - per primary team    For  questions or updates, please contact [facility] Please consult [email or web address] for contact info under        Signed, Hristopher Missildine, NP

## 2023-11-12 NOTE — Progress Notes (Signed)
 PROGRESS NOTE    Dale [name]  FMW:[id number] DOB: October 26, 1961 DOA: 11/07/2023 PCP: Pcp, No    Brief Narrative:  63 year old with history of alcohol  abuse, CAD, cardiorenal syndrome, CHF with reduced EF, ischemic cardiomyopathy, stage IV CKD, cocaine use, HLD, HTN, morbid obesity, medication noncompliance was recently admitted to the hospital for 20 days for acute respiratory failure complicated by CHF exacerbation and AKI.  Upon admission noted to be in CHF exacerbation again and chest x-ray showing right lower lobe consolidation.  Upon admission patient started on IV antibiotics and diuretics.  Initially felt better thereafter complicated by AKI and acidosis.  Seen by cardiology but unfortunately does not have much options at this point.   Assessment & Plan:  Principal Problem:   Right lower lobe pneumonia Active Problems:   Alcohol  use disorder, moderate, in early remission (HCC)   Essential hypertension   Obesity (BMI 30-39.9)   Cocaine abuse (HCC)   Normochromic anemia   Hyperlipidemia   NSVT (nonsustained ventricular tachycardia) (HCC)   Acute on chronic combined systolic and diastolic HF (heart failure) (HCC)   Coronary artery disease involving native coronary artery of native heart without angina pectoris   Hypokalemia   MDD (major depressive disorder), recurrent severe, without psychosis (HCC)    AKI on CKD stage IIIb Severe metabolic acidosis -Baseline creatinine 1.9.  Creatinine peaked at 2.43 but improved with aggressive diuresis.  Lactic acidosis is stabilizing Cardiorenal/hypoperfusion leading to a lot of his issues.     Acute on chronic combined systolic and diastolic HF (heart failure) (HCC) Bilateral lower extremity swelling Recent echocardiogram in December 2024 showed EF less than 20% with severely reduced LV function, global hypokinesia, grade 2 DD with dilated cardiomyopathy.  Not a candidate for any advanced cardiac medical therapy at this point.  Very  poor compliance. - Continuing to improve with aggressive IV diuretics, received dose of Zaroxolyn  1/7.  Nausea vomiting - X-rays negative.  Suspect from bowel wall edema.  Antiemetics as needed continue diuresis  Right lower lobe pneumonia Chest x-ray showing right lower lobe consolidation.  Procalcitonin 0.58.  De-escalate vancomycin /cefepime  to Augmentin  for 5 more days to complete total 7-day course.  EOT 1/9  Hypokalemia - Aggressive repletion.     NSVT (nonsustained ventricular tachycardia) (HCC)  Per documentation recently had V-fib arrest.  Currently on amiodarone  200 mg daily.   Coronary artery disease Continue aspirin , off statin Unable to tolerate beta-blockers     MDD (major depressive disorder),  recurrent severe, without psychosis (HCC) Continue bedtime mirtazapine .  Klonopin  twice daily.     Essential hypertension On Lasix .  Requiring midodrine      Alcohol  use disorder, moderate, in early remission [facility]) The patient stated he has not been drinking.     Obesity (BMI 30-39.9) Tobacco cessation advised. Nicotine replacement therapy ordered.     Cocaine abuse (HCC) In remission.     Normochromic anemia Monitor hematocrit and hemoglobin.     Hyperlipidemia Off statin. Follow-up with PCP  Seen by palliative care, overall poor prognosis. Seen by [facility] for safe Dispo.   DVT prophylaxis: enoxaparin  (LOVENOX ) injection 40 mg Start: 11/07/23 2200    Code Status: Limited: Do not attempt resuscitation (DNR) -DNR-LIMITED -Do Not Intubate/DNI  Family Communication:  called [name], no answer.  Status is: Inpatient Remains inpatient appropriate because: Continue hospital stay for IV diuretics  Subjective: Laying flat, feeling better.  Examination:  General exam: Appears calm and comfortable  Respiratory system: bibasilar crackles; improved Cardiovascular system: S1 &  S2 heard, RRR. No JVD, murmurs, rubs, gallops or clicks. 3+ b/l LE edema.  Gastrointestinal  system: Abdomen is nondistended, soft and nontender. No organomegaly or masses felt. Normal bowel sounds heard. Central nervous system: Alert and oriented. No focal neurological deficits. Extremities: Symmetric 5 x 5 power. Skin: No rashes, lesions or ulcers Psychiatry: Judgement and insight appear normal. Mood & affect appropriate.                Diet Orders (From admission, onward)     Start     Ordered   11/07/23 1345  Diet Heart Room service appropriate? Yes; Fluid consistency: Thin; Fluid restriction: 1200 mL Fluid  Diet effective now       Question Answer Comment  Room service appropriate? Yes   Fluid consistency: Thin   Fluid restriction: 1200 mL Fluid      11/07/23 1346            Objective: Vitals:   11/11/23 1419 11/11/23 1948 11/12/23 0347 11/12/23 0500  BP:  98/78 102/69   Pulse:  91 86   Resp:  19 19   Temp:  98 F (36.7 C) 98 F (36.7 C)   TempSrc:      SpO2: 98% 100% 100%   Weight:    120.5 kg  Height:        Intake/Output Summary (Last 24 hours) at 11/12/2023 1130 Last data filed at 11/12/2023 1000 Gross per 24 hour  Intake 1144.36 ml  Output 2950 ml  Net -1805.64 ml   Filed Weights   11/10/23 0709 11/11/23 0500 11/12/23 0500  Weight: 118.6 kg 121.6 kg 120.5 kg    Scheduled Meds:  allopurinol   100 mg Oral Daily   amiodarone   200 mg Oral Daily   amoxicillin -clavulanate  1 tablet Oral Q12H   aspirin  EC  81 mg Oral Daily   clonazePAM   0.5 mg Oral BID   enoxaparin  (LOVENOX ) injection  40 mg Subcutaneous Q24H   midodrine   15 mg Oral TID WC   mirtazapine   15 mg Oral QHS   pantoprazole   40 mg Oral Daily   potassium chloride   40 mEq Oral TID   Continuous Infusions:  furosemide  120 mg (11/12/23 0500)    Nutritional status     Body mass index is 37.05 kg/m.  Data Reviewed:   CBC: Recent Labs  Lab 11/07/23 1142 11/09/23 0615 11/10/23 0528 11/11/23 0519 11/12/23 0526  WBC 9.0 8.0 10.0 11.0* 9.6  NEUTROABS 7.4  --   --   --    --   HGB 12.5* 12.2* 13.3 11.9* 14.4  HCT 36.9* 38.4* 41.7 35.1* 42.2  MCV 90.9 93.0 95.2 90.5 87.9  PLT 378 395 406* 353 227   Basic Metabolic Panel: Recent Labs  Lab 11/07/23 1050 11/08/23 0706 11/09/23 0615 11/10/23 0528 11/11/23 0519 11/12/23 0526  NA 137  --  139 139 132* 133*  K 3.5  --  2.8* 4.8 5.1 3.3*  CL 100  --  100 103 95* 91*  CO2 22  --  28 17* 23 27  GLUCOSE 94  --  117* 90 109* 97  BUN 63*  --  55* 55* 71* 60*  CREATININE 2.14*  --  1.59* 1.98* 2.43* 1.96*  CALCIUM  9.2  --  9.0 9.6 9.4 8.9  MG  --  2.4  --   --   --   --   PHOS  --   --  3.7  --   --   --  GFR: Estimated Creatinine Clearance: 52.9 mL/min (A) (by C-G formula based on SCr of 1.96 mg/dL (H)). Liver Function Tests: No results for input(s): AST, ALT, ALKPHOS, BILITOT, PROT, ALBUMIN  in the last 168 hours. No results for input(s): LIPASE, AMYLASE in the last 168 hours. Recent Labs  Lab 11/10/23 0837  AMMONIA 17   Coagulation Profile: No results for input(s): INR, PROTIME in the last 168 hours. Cardiac Enzymes: No results for input(s): CKTOTAL, CKMB, CKMBINDEX, TROPONINI in the last 168 hours. BNP (last 3 results) No results for input(s): PROBNP in the last 8760 hours. HbA1C: No results for input(s): HGBA1C in the last 72 hours. CBG: Recent Labs  Lab 11/10/23 2238  GLUCAP 118*   Lipid Profile: No results for input(s): CHOL, HDL, LDLCALC, TRIG, CHOLHDL, LDLDIRECT in the last 72 hours. Thyroid Function Tests: No results for input(s): TSH, T4TOTAL, FREET4, T3FREE, THYROIDAB in the last 72 hours. Anemia Panel: No results for input(s): VITAMINB12, FOLATE, FERRITIN, TIBC, IRON, RETICCTPCT in the last 72 hours. Sepsis Labs: Recent Labs  Lab 11/07/23 1050 11/08/23 0706 11/10/23 0837 11/11/23 0838 11/11/23 1143  PROCALCITON 0.58 0.56  --   --   --   LATICACIDVEN  --   --  >9.0* 2.3* 2.4*    Recent Results (from  the past 240 hours)  Resp panel by RT-PCR (RSV, Flu A&B, Covid) Anterior Nasal Swab     Status: None   Collection Time: 11/07/23 11:53 AM   Specimen: Anterior Nasal Swab  Result Value Ref Range Status   SARS Coronavirus 2 by RT PCR NEGATIVE NEGATIVE Final    Comment: (NOTE) SARS-CoV-2 target nucleic acids are NOT DETECTED.  The SARS-CoV-2 RNA is generally detectable in upper respiratory specimens during the acute phase of infection. The lowest concentration of SARS-CoV-2 viral copies this assay can detect is 138 copies/mL. A negative result does not preclude SARS-Cov-2 infection and should not be used as the sole basis for treatment or other patient management decisions. A negative result may occur with  improper specimen collection/handling, submission of specimen other than nasopharyngeal swab, presence of viral mutation(s) within the areas targeted by this assay, and inadequate number of viral copies(<138 copies/mL). A negative result must be combined with clinical observations, patient history, and epidemiological information. The expected result is Negative.  Fact Sheet for Patients:  bloggercourse.com  Fact Sheet for Healthcare Providers:  seriousbroker.it  This test is no t yet approved or cleared by the [place]  FDA and  has been authorized for detection and/or diagnosis of SARS-CoV-2 by FDA under an Emergency Use Authorization (EUA). This EUA will remain  in effect (meaning this test can be used) for the duration of the COVID-19 declaration under Section 564(b)(1) of the Act, 21 U.S.C.section 360bbb-3(b)(1), unless the authorization is terminated  or revoked sooner.       Influenza A by PCR NEGATIVE NEGATIVE Final   Influenza B by PCR NEGATIVE NEGATIVE Final    Comment: (NOTE) The Xpert Xpress SARS-CoV-2/FLU/RSV plus assay is intended as an aid in the diagnosis of influenza from Nasopharyngeal swab specimens  and should not be used as a sole basis for treatment. Nasal washings and aspirates are unacceptable for Xpert Xpress SARS-CoV-2/FLU/RSV testing.  Fact Sheet for Patients: bloggercourse.com  Fact Sheet for Healthcare Providers: seriousbroker.it  This test is not yet approved or cleared by the [place]  FDA and has been authorized for detection and/or diagnosis of SARS-CoV-2 by FDA under an Emergency Use Authorization (EUA). This EUA will remain in  effect (meaning this test can be used) for the duration of the COVID-19 declaration under Section 564(b)(1) of the Act, 21 U.S.C. section 360bbb-3(b)(1), unless the authorization is terminated or revoked.     Resp Syncytial Virus by PCR NEGATIVE NEGATIVE Final    Comment: (NOTE) Fact Sheet for Patients: bloggercourse.com  Fact Sheet for Healthcare Providers: seriousbroker.it  This test is not yet approved or cleared by the [place]  FDA and has been authorized for detection and/or diagnosis of SARS-CoV-2 by FDA under an Emergency Use Authorization (EUA). This EUA will remain in effect (meaning this test can be used) for the duration of the COVID-19 declaration under Section 564(b)(1) of the Act, 21 U.S.C. section 360bbb-3(b)(1), unless the authorization is terminated or revoked.  Performed at [facility], [address], [place], [place] [address]   MRSA Next Gen by PCR, Nasal     Status: None   Collection Time: 11/08/23  4:11 PM   Specimen: Nasal Mucosa; Nasal Swab  Result Value Ref Range Status   MRSA by PCR Next Gen NOT DETECTED NOT DETECTED Final    Comment: (NOTE) The GeneXpert MRSA Assay (FDA approved for NASAL specimens only), is one component of a comprehensive MRSA colonization surveillance program. It is not intended to diagnose MRSA infection nor to guide or monitor treatment for MRSA  infections. Test performance is not FDA approved in patients less than 10 years old. Performed at [facility], [address] [place], [place] [address]   Urine Culture (for pregnant, neutropenic or urologic patients or patients with an indwelling urinary catheter)     Status: None   Collection Time: 11/10/23 11:12 AM   Specimen: Urine, Clean Catch  Result Value Ref Range Status   Specimen Description   Final    URINE, CLEAN CATCH Performed at [facility], [address], [place], [place] [address]    Special Requests   Final    NONE Performed at [facility], [address], [place], [place] [address]    Culture   Final    NO GROWTH Performed at [facility], [address] [place], [place] [address]    Report Status 11/11/2023 FINAL  Final         Radiology Studies: DG Abd 1 View Result Date: 11/11/2023 CLINICAL DATA:  Nausea, vomiting. EXAM: ABDOMEN - 1 VIEW COMPARISON:  October 24, 2023. FINDINGS: No abnormal bowel dilatation is noted. Contrast is noted in nondilated colon. IMPRESSION: No abnormal bowel dilatation. Electronically Signed   By: Lynwood Landy Raddle M.D.   On: 11/11/2023 10:14           LOS: 5 days   Time spent= 35 mins    Jimi Giza JAYSON Dare, MD [facility]  If 7PM-7AM, please contact night-coverage  11/12/2023, 11:30 AM

## 2023-11-12 NOTE — Progress Notes (Signed)
   11/12/23 1400  Spiritual Encounters  Type of Visit Attempt (pt unavailable)  Care provided to: Pt not available  Reason for visit Advance directives  OnCall Visit No   Patient was asleep when I tried to visit to discuss an advance directive. Will try later or let oncoming Chaplain know the situation unable to complete task

## 2023-11-12 NOTE — TOC Progression Note (Signed)
 Transition of Care [facility]) - Progression Note    Patient Details  Name: Dale [name] MRN: [id number] Date of Birth: 1961-03-01  [facility]) CM/SW Contact  Hoy DELENA Bigness, LCSW Phone Number: 11/12/2023, 3:42 PM  Clinical Narrative:    Spoke with pt regarding discharge plans. Pt reports he does not want to go to [employer] and states he would like to stay with his daughter in [place]. Pt agreeable to CSW calling pt's probation officer to determine if pt is able to stay out of county. PT unable to provide probation officer information. VM left for [facility] probation office.    Expected Discharge Plan:  (TBD) Barriers to Discharge: Continued Medical Work up, Homeless with medical needs, Unsafe home situation  Expected Discharge Plan and Services In-house Referral: Clinical Social Work Discharge Planning Services: NA Post Acute Care Choice: NA Living arrangements for the past 2 months: No permanent address                                       Social Determinants of Health (SDOH) Interventions SDOH Screenings   Food Insecurity: Food Insecurity Present (11/07/2023)  Housing: High Risk (11/07/2023)  Transportation Needs: Unmet Transportation Needs (11/07/2023)  Utilities: Not At Risk (11/07/2023)  Alcohol  Screen: High Risk (02/20/2022)  Depression (PHQ2-9): Medium Risk (05/05/2019)  Financial Resource Strain: High Risk (08/25/2023)  Tobacco Use: Low Risk  (11/07/2023)    Readmission Risk Interventions    11/10/2023    3:36 PM 10/20/2023    3:32 PM 10/19/2023    2:46 PM  Readmission Risk Prevention Plan  Transportation Screening Complete Complete Complete  Medication Review Oceanographer) Complete Complete   PCP or Specialist appointment within 3-5 days of discharge Complete Complete --  HRI or Home Care Consult Complete Complete Not Complete  HRI or Home Care Consult Pt Refusal Comments   Homeless  SW Recovery Care/Counseling  Consult Complete Complete Complete  Palliative Care Screening Complete Complete   Skilled Nursing Facility Not Applicable Not Applicable

## 2023-11-13 DIAGNOSIS — I509 Heart failure, unspecified: Secondary | ICD-10-CM | POA: Diagnosis not present

## 2023-11-13 DIAGNOSIS — J189 Pneumonia, unspecified organism: Secondary | ICD-10-CM | POA: Diagnosis not present

## 2023-11-13 LAB — BASIC METABOLIC PANEL
Anion gap: 13 (ref 5–15)
BUN: 49 mg/dL — ABNORMAL HIGH (ref 6–20)
CO2: 32 mmol/L (ref 22–32)
Calcium: 9 mg/dL (ref 8.9–10.3)
Chloride: 89 mmol/L — ABNORMAL LOW (ref 98–111)
Creatinine, Ser: 1.69 mg/dL — ABNORMAL HIGH (ref 0.61–1.24)
GFR, Estimated: 46 mL/min — ABNORMAL LOW (ref >60)
Glucose, Bld: 89 mg/dL (ref 70–99)
Potassium: 2.9 mmol/L — ABNORMAL LOW (ref 3.5–5.1)
Sodium: 134 mmol/L — ABNORMAL LOW (ref 135–145)

## 2023-11-13 LAB — PHOSPHORUS: Phosphorus: 3.5 mg/dL (ref 2.5–4.6)

## 2023-11-13 LAB — CBC
HCT: 39.3 % (ref 39.0–52.0)
Hemoglobin: 13 g/dL (ref 13.0–17.0)
MCH: 30.1 pg (ref 26.0–34.0)
MCHC: 33.1 g/dL (ref 30.0–36.0)
MCV: 91 fL (ref 80.0–100.0)
Platelets: 339 10*3/uL (ref 150–400)
RBC: 4.32 MIL/uL (ref 4.22–5.81)
RDW: 16.6 % — ABNORMAL HIGH (ref 11.5–15.5)
WBC: 10 10*3/uL (ref 4.0–10.5)
nRBC: 0 % (ref 0.0–0.2)

## 2023-11-13 LAB — MAGNESIUM: Magnesium: 2.1 mg/dL (ref 1.7–2.4)

## 2023-11-13 MED ORDER — POTASSIUM CHLORIDE CRYS ER 20 MEQ PO TBCR
40.0000 meq | EXTENDED_RELEASE_TABLET | Freq: Four times a day (QID) | ORAL | Status: AC
  Administered 2023-11-13 (×4): 40 meq via ORAL
  Filled 2023-11-13 (×4): qty 2

## 2023-11-13 MED ORDER — POTASSIUM CHLORIDE CRYS ER 20 MEQ PO TBCR: 40.0000 meq | EXTENDED_RELEASE_TABLET | Freq: Once | ORAL | Status: DC

## 2023-11-13 MED ORDER — METOLAZONE 5 MG PO TABS
5.0000 mg | ORAL_TABLET | Freq: Once | ORAL | Status: AC
  Administered 2023-11-13: 5 mg via ORAL
  Filled 2023-11-13: qty 1

## 2023-11-13 NOTE — Progress Notes (Signed)
 Patient Name: Dale [name] Date of Encounter: 11/13/2023 [facility] Cardiologist: Ezra Shuck, MD   Interval Summary  .    63 y.o. male with a hx of HTN, polysubstance abuse with cocaine, GERD, ETOH abuse, mixed NICM/ICM, chronic HFrEF, CAD, CKD stage III-IV, hyperlipidemia, depression,  who is followed by cardiology for acute on chronic systolic heart failure. He has hx of recurrent hospitalization for CHF due to non-compliance. He was deemed not candidate for ICD or advanced therapy due to active ETOH abuse, non-compliance, and difficult social situation. Last admitted in Dec 2024 for 20 days for CHF, complicated by cardiogenic shock, AKI, VF arrest, mechanical ventilation, and suicidal ideation. GDMT has been limited by hypotension, renal disease. He is now returned for acute CHF since 11/06/22, also receiving treatment for pneumonia at this time.   Patient states he feels much better today, is up and washing himself this morning. He said he needs to stop drinking ETOH.   Vital Signs .    Vitals:   11/12/23 1238 11/12/23 1954 11/13/23 0503 11/13/23 0800  BP: 109/88 106/83 103/72 110/84  Pulse: 89 89 77 96  Resp:  19 18   Temp: 97.8 F (36.6 C) 98.1 F (36.7 C) 97.6 F (36.4 C)   TempSrc: Oral     SpO2: 98% 98% 96%   Weight:   114 kg   Height:        Intake/Output Summary (Last 24 hours) at 11/13/2023 1057 Last data filed at 11/13/2023 1051 Gross per 24 hour  Intake 700 ml  Output 2400 ml  Net -1700 ml      11/13/2023    5:03 AM 11/12/2023    5:00 AM 11/11/2023    5:00 AM  Last 3 Weights  Weight (lbs) 251 lb 5.2 oz 265 lb 10.5 oz 268 lb 1.3 oz  Weight (kg) 114 kg 120.5 kg 121.6 kg      Telemetry/ECG    sinus rhythm with rare PVC - Personally Reviewed  Physical Exam .   GEN: No acute distress.   Neck: No JVD Cardiac: RRR, no murmurs, rubs, or gallops.  Respiratory: clear bilaterally, on room air  GI: Soft, nontender, non-distended  MS: ACE wrap in  place,not removed for exam  Assessment & Plan .     Acute on chronic systolic and diastolic heart failure  - returned 11/06/22 for SOB with running, recently 20 days hospitalization for cardiogenic shock that was complicated by AKI, ventilator dependent respiratory failure, VF arrest, and suicidal ideation  - BNP >4500 (was 744 on 10/18/23), lactic acid >9, Cr 2.14 with GFR 35,  POA - CXR with right lower lobe consolidation, procal 0.58, POA - Echo last 10/16/23 with LVEF <20%, global HPK, severely dilated LV, grade II DD, mod reduced RV, severely enlarged RV, normal PASP, severe LAE and RAE, mild MR, trivial AI  - Case has been reviewed with AHF team by MD, he remains not a candidate for ICD/advanced therapy due to active ETOH abuse, non-compliance, and poor social situation, please continue ongoing goals of care discussion and symptoms management  - Lasix  has been increased to 120mg  BID since 11/10/22, along with a dose of metolazone  on 11/10/22 , UOP 2500 ml over the past 24 hours, weight 256>268>265 >251ibs, Cr improving from 2.43 >>1.96 >1.69  today, lactic acid had downtrended to 2.4 from >9, would continue current dose of lasix  for diuresis for another 24 hours before transition to PO, trend daily BMP, weight, and  accurate I&O - GDMT: historically limited due to hypotension requiring midodrine  and advanced renal disease (attempted digoxin , spironolactone , entresto , bidil , and farxiga ); BP remains low normal, AKI improving, will not add additional therapy at this time   Hx of VF arrest 10/2023 - continue amiodarone  - not candidate for ICD   CAD - medically managed, continue ASA 81mg  daily, may add statin if patient wishes at time of discharge  AKI on CKD IIIb Pneumonia  Anxiety with depression Hypotension  ETOH /THC abuse  - per primary team    For questions or updates, please contact [facility] Please consult [email or web address] for contact info under        Signed, Lella Mullany,  NP

## 2023-11-13 NOTE — TOC Progression Note (Signed)
 Transition of Care [facility]) - Progression Note    Patient Details  Name: Dale [name] MRN: [id number] Date of Birth: 1960/12/07  [facility]) CM/SW Contact  Hoy DELENA Bigness, LCSW Phone Number: 11/13/2023, 1:12 PM  Clinical Narrative:    Met with pt to discuss discharge. CSW has not received call back from PO office. Attempted to obtain more information about parole officer assigned. Pt shared no officer was assigned and states that he received a letter stating he has to submit his address to their office every 6mos or as it changes. CSW discussed the difference of being on probation vs being on the sex offender registry. Pt shared that he thought they were the same. As pt is not on probation pt is able to stay outside of [facility]. CSW left VM for pt's daughter. CSW spoke with pt's mother who agrees pt is able to stay with her for her short term period while awaiting transitional housing through the [place]. She shares she will be out of the house from 7am tomorrow until around 2:30pm. She shares pt is able to come to her house following that but, she will be unable to provide transportation for pt.    Expected Discharge Plan:  (TBD) Barriers to Discharge: Continued Medical Work up, Homeless with medical needs, Unsafe home situation  Expected Discharge Plan and Services In-house Referral: Clinical Social Work Discharge Planning Services: NA Post Acute Care Choice: NA Living arrangements for the past 2 months: No permanent address                                       Social Determinants of Health (SDOH) Interventions SDOH Screenings   Food Insecurity: Food Insecurity Present (11/07/2023)  Housing: High Risk (11/07/2023)  Transportation Needs: Unmet Transportation Needs (11/07/2023)  Utilities: Not At Risk (11/07/2023)  Alcohol  Screen: High Risk (02/20/2022)  Depression (PHQ2-9): Medium Risk (05/05/2019)  Financial Resource Strain: High Risk (08/25/2023)  Tobacco  Use: Low Risk  (11/07/2023)    Readmission Risk Interventions    11/10/2023    3:36 PM 10/20/2023    3:32 PM 10/19/2023    2:46 PM  Readmission Risk Prevention Plan  Transportation Screening Complete Complete Complete  Medication Review Oceanographer) Complete Complete   PCP or Specialist appointment within 3-5 days of discharge Complete Complete --  HRI or Home Care Consult Complete Complete Not Complete  HRI or Home Care Consult Pt Refusal Comments   Homeless  SW Recovery Care/Counseling Consult Complete Complete Complete  Palliative Care Screening Complete Complete   Skilled Nursing Facility Not Applicable Not Applicable

## 2023-11-13 NOTE — Progress Notes (Signed)
 Mobility Specialist - Progress Note   11/13/23 0900  Mobility  Activity Ambulated with assistance in hallway  Level of Assistance Standby assist, set-up cues, supervision of patient - no hands on  Assistive Device None  Distance Ambulated (ft) 50 ft  Range of Motion/Exercises Active  Activity Response Tolerated well  Mobility Referral Yes  Mobility visit 1 Mobility  Mobility Specialist Start Time (ACUTE ONLY) 0930  Mobility Specialist Stop Time (ACUTE ONLY) 0941  Mobility Specialist Time Calculation (min) (ACUTE ONLY) 11 min   Received in chair and agreed to mobility, flat affect throughout session. No c/o discomfort throughout session. Returned to bed with all needs met.  Cyndee Ada Mobility Specialist

## 2023-11-13 NOTE — Plan of Care (Signed)
  Problem: Education: Goal: Knowledge of General Education information will improve Description: Including pain rating scale, medication(s)/side effects and non-pharmacologic comfort measures Outcome: Not Progressing   Problem: Health Behavior/Discharge Planning: Goal: Ability to manage health-related needs will improve Outcome: Not Progressing   Problem: Clinical Measurements: Goal: Ability to maintain clinical measurements within normal limits will improve Outcome: Not Progressing Goal: Will remain free from infection Outcome: Not Progressing Goal: Diagnostic test results will improve Outcome: Not Progressing Goal: Respiratory complications will improve Outcome: Not Progressing Goal: Cardiovascular complication will be avoided Outcome: Not Progressing   Problem: Activity: Goal: Risk for activity intolerance will decrease Outcome: Not Progressing   Problem: Nutrition: Goal: Adequate nutrition will be maintained Outcome: Not Progressing   Problem: Coping: Goal: Level of anxiety will decrease Outcome: Not Progressing   Problem: Elimination: Goal: Will not experience complications related to bowel motility Outcome: Not Progressing Goal: Will not experience complications related to urinary retention Outcome: Not Progressing   Problem: Pain Management: Goal: General experience of comfort will improve Outcome: Not Progressing   Problem: Safety: Goal: Ability to remain free from injury will improve Outcome: Not Progressing   Problem: Skin Integrity: Goal: Risk for impaired skin integrity will decrease Outcome: Not Progressing   Problem: Education: Goal: Ability to demonstrate management of disease process will improve Outcome: Not Progressing Goal: Ability to verbalize understanding of medication therapies will improve Outcome: Not Progressing Goal: Individualized Educational Video(s) Outcome: Not Progressing   Problem: Activity: Goal: Capacity to carry out  activities will improve Outcome: Not Progressing   Problem: Cardiac: Goal: Ability to achieve and maintain adequate cardiopulmonary perfusion will improve Outcome: Not Progressing

## 2023-11-13 NOTE — Evaluation (Signed)
 Occupational Therapy Evaluation Patient Details Name: Dale [name] MRN: [id number] DOB: 1961-07-09 Today's Date: 11/13/2023   History of Present Illness 63 yo male presents to therapy following hospital admission on 11/06/2022 due to progressive SOB, B LE edema and volume overload as well as suicidal ideation. Pt was found to have PNA and CHF exacerbation. Pt was recently hospitalized in Dec 2024 for 20 days due to CHF, cardiogenic shock, AKI, VF arrest, suicidal ideation and required mechanical ventilation. Pt PMH includes but is not limited to: substance abuse, CAD s/p cath, s and dCHF, CKD III, HLD, HTN, cardiomyopathy, PTSD, depression, GERD, and mixed NICM/ICM.   Clinical Impression   Pt presents with decline in function and safety with ADLs and ADL mobility with impaired balance and endurance. Pt reports 5/10 HA. PTA pt  reports that lived with a roommate and was Ind with ADLs/selfcare. Pt currently requires min A with LB ADLs mostly due to B LE edema, Sup with mobility/transfers using RW. Pt would benefit from acute OT services to address impairments to maximize level of function and safety      If plan is discharge home, recommend the following: A little help with bathing/dressing/bathroom;Assist for transportation    Functional Status Assessment  Patient has had a recent decline in their functional status and demonstrates the ability to make significant improvements in function in a reasonable and predictable amount of time.  Equipment Recommendations  Other (comment) (sock aid, reacher)    Recommendations for Other Services       Precautions / Restrictions Precautions Precautions: Fall Precaution Comments: monitor O2 Restrictions Weight Bearing Restrictions Per Provider Order: No      Mobility Bed Mobility Overal bed mobility: Modified Independent                  Transfers Overall transfer level: Needs assistance   Transfers: Sit to/from Stand Sit to  Stand: Supervision                  Balance Overall balance assessment: Mild deficits observed, not formally tested                                         ADL either performed or assessed with clinical judgement   ADL Overall ADL's : Needs assistance/impaired Eating/Feeding: Independent;Sitting   Grooming: Wash/dry hands;Wash/dry face;Supervision/safety;Standing   Upper Body Bathing: Modified independent   Lower Body Bathing: Minimal assistance Lower Body Bathing Details (indicate cue type and reason): limited by B LE edema Upper Body Dressing : Modified independent   Lower Body Dressing: Minimal assistance Lower Body Dressing Details (indicate cue type and reason): limited by B LE edema Toilet Transfer: Supervision/safety;Ambulation;Rolling walker (2 wheels)   Toileting- Clothing Manipulation and Hygiene: Supervision/safety;Sit to/from stand       Functional mobility during ADLs: Supervision/safety;Rolling walker (2 wheels)       Vision Ability to See in Adequate Light: 0 Adequate Patient Visual Report: No change from baseline       Perception         Praxis         Pertinent Vitals/Pain Pain Assessment Pain Assessment: 0-10 Pain Score: 5  Pain Location: headache Pain Descriptors / Indicators: Aching Pain Intervention(s): Monitored during session     Extremity/Trunk Assessment Upper Extremity Assessment Upper Extremity Assessment: Overall WFL for tasks assessed   Lower Extremity Assessment Lower Extremity Assessment: Defer  to PT evaluation   Cervical / Trunk Assessment Cervical / Trunk Assessment: Normal   Communication Communication Communication: No apparent difficulties   Cognition Arousal: Alert Behavior During Therapy: WFL for tasks assessed/performed, Flat affect Overall Cognitive Status: Within Functional Limits for tasks assessed                                       General Comments        Exercises     Shoulder Instructions      Home Living Family/patient expects to be discharged to:: Shelter/Homeless Living Arrangements: Non-relatives/Friends                                      Prior Functioning/Environment Prior Level of Function : Independent/Modified Independent             Mobility Comments: RW for mobility, mod I for ADLs and self care tasks with increased time due to SOB and fatigue ADLs Comments: Ind with ADLs/selfcare; reports SOB makes things difficult some days        OT Problem List: Impaired balance (sitting and/or standing);Decreased activity tolerance;Decreased knowledge of use of DME or AE      OT Treatment/Interventions: Self-care/ADL training;DME and/or AE instruction;Therapeutic activities;Patient/family education;Energy conservation    OT Goals(Current goals can be found in the care plan section) Acute Rehab OT Goals Patient Stated Goal: get better OT Goal Formulation: With patient Time For Goal Achievement: 11/27/23 Potential to Achieve Goals: Good ADL Goals Pt Will Perform Grooming: with supervision;with set-up;with modified independence;standing Pt Will Perform Lower Body Bathing: with contact guard assist;with supervision;with adaptive equipment Pt Will Perform Lower Body Dressing: with contact guard assist;with supervision;with adaptive equipment Pt Will Transfer to Toilet: with modified independence;ambulating Pt Will Perform Toileting - Clothing Manipulation and hygiene: with modified independence;sit to/from stand Additional ADL Goal #1: pt will verbalize and demo 3 energy conservation strategies for ADLs and ADL mobility  OT Frequency: Min 1X/week    Co-evaluation              AM-PAC OT 6 Clicks Daily Activity     Outcome Measure Help from another person eating meals?: None Help from another person taking care of personal grooming?: A Little Help from another person toileting, which includes using  toliet, bedpan, or urinal?: A Little Help from another person bathing (including washing, rinsing, drying)?: A Little Help from another person to put on and taking off regular upper body clothing?: None Help from another person to put on and taking off regular lower body clothing?: A Little 6 Click Score: 20   End of Session Equipment Utilized During Treatment: Gait belt;Rolling walker (2 wheels) Nurse Communication: Mobility status  Activity Tolerance: Patient tolerated treatment well Patient left: in chair;with call bell/phone within reach  OT Visit Diagnosis: Other abnormalities of gait and mobility (R26.89);Muscle weakness (generalized) (M62.81)                Time: 8862-8796 OT Time Calculation (min): 26 min Charges:  OT General Charges $OT Visit: 1 Visit OT Evaluation $OT Eval Low Complexity: 1 Low OT Treatments $Self Care/Home Management : 8-22 mins    Jacques Karna Loose 11/13/2023, 1:13 PM

## 2023-11-13 NOTE — Progress Notes (Signed)
 PROGRESS NOTE    Dale [name]  FMW:[id number] DOB: [birth year] DOA: 11/07/2023 PCP: Pcp, No    Brief Narrative:  63 year old with history of alcohol  abuse, CAD, cardiorenal syndrome, CHF with reduced EF, ischemic cardiomyopathy, stage IV CKD, cocaine use, HLD, HTN, morbid obesity, medication noncompliance was recently admitted to the hospital for 20 days for acute respiratory failure complicated by CHF exacerbation and AKI.  Upon admission noted to be in CHF exacerbation again and chest x-ray showing right lower lobe consolidation.  Upon admission patient started on IV antibiotics and diuretics.  Initially felt better thereafter complicated by AKI and acidosis.  Seen by cardiology but unfortunately does not have much options at this point.  Currently patient continues to do well with diuretics.  Has received high doses of IV Lasix  and Zaroxolyn .   Assessment & Plan:  Principal Problem:   Right lower lobe pneumonia Active Problems:   Alcohol  use disorder, moderate, in early remission (HCC)   Essential hypertension   Obesity (BMI 30-39.9)   Cocaine abuse (HCC)   Normochromic anemia   Hyperlipidemia   NSVT (nonsustained ventricular tachycardia) (HCC)   Acute on chronic combined systolic and diastolic HF (heart failure) (HCC)   Coronary artery disease involving native coronary artery of native heart without angina pectoris   Hypokalemia   MDD (major depressive disorder), recurrent severe, without psychosis (HCC)    AKI on CKD stage IIIb Severe metabolic acidosis -Baseline creatinine 1.9.  Creatinine peaked at 2.43 but improving with diuretics. Cardiorenal/hypoperfusion leading to a lot of his issues.     Acute on chronic combined systolic and diastolic HF (heart failure) (HCC) Bilateral lower extremity swelling Recent echocardiogram in December 2024 showed EF less than 20% with severely reduced LV function, global hypokinesia, grade 2 DD with dilated cardiomyopathy.  Not a  candidate for any advanced cardiac medical therapy at this point.  Very poor compliance. - On Lasix  120 mg twice daily.  Intermittently getting metaxalone.  Good response to this.  Nausea vomiting - X-rays negative.  Suspect from bowel wall edema.  Antiemetics as needed continue diuresis  Right lower lobe pneumonia Chest x-ray showing right lower lobe consolidation.  Procalcitonin 0.58.  De-escalate vancomycin /cefepime  to Augmentin  for 5 more days to complete total 7-day course.  EOT 1/9.  Hypokalemia - Aggressive repletion.  Check magnesium  and phosphorus     NSVT (nonsustained ventricular tachycardia) (HCC)  Per documentation recently had V-fib arrest.  Currently on amiodarone  200 mg daily.   Coronary artery disease Continue aspirin , off statin Unable to tolerate beta-blockers     MDD (major depressive disorder),  recurrent severe, without psychosis (HCC) Continue bedtime mirtazapine .  Klonopin  twice daily.     Essential hypertension On Lasix .  Requiring midodrine      Alcohol  use disorder, moderate, in early remission [facility]) The patient stated he has not been drinking.     Obesity (BMI 30-39.9) Tobacco cessation advised. Nicotine replacement therapy ordered.     Cocaine abuse (HCC) In remission.     Normochromic anemia Monitor hematocrit and hemoglobin.     Hyperlipidemia Off statin. Follow-up with PCP  Seen by palliative care, overall poor prognosis. [facility] for safe Dispo.   DVT prophylaxis: enoxaparin  (LOVENOX ) injection 40 mg Start: 11/07/23 2200    Code Status: Limited: Do not attempt resuscitation (DNR) -DNR-LIMITED -Do Not Intubate/DNI  Family Communication:  called [name], no answer.  Status is: Inpatient Responding well to IV diuretics.  Needs aggressive management for electrolytes  Subjective: Feels  a little better.  Slowly improving with diuretics  Examination:  General exam: Appears calm and comfortable  Respiratory system: bibasilar  crackles; improved Cardiovascular system: S1 & S2 heard, RRR. No JVD, murmurs, rubs, gallops or clicks. 3+ b/l LE edema.  Gastrointestinal system: Abdomen is nondistended, soft and nontender. No organomegaly or masses felt. Normal bowel sounds heard. Central nervous system: Alert and oriented. No focal neurological deficits. Extremities: Symmetric 5 x 5 power. Skin: No rashes, lesions or ulcers Psychiatry: Judgement and insight appear normal. Mood & affect appropriate.                Diet Orders (From admission, onward)     Start     Ordered   11/07/23 1345  Diet Heart Room service appropriate? Yes; Fluid consistency: Thin; Fluid restriction: 1200 mL Fluid  Diet effective now       Question Answer Comment  Room service appropriate? Yes   Fluid consistency: Thin   Fluid restriction: 1200 mL Fluid      11/07/23 1346            Objective: Vitals:   11/12/23 1238 11/12/23 1954 11/13/23 0503 11/13/23 0800  BP: 109/88 106/83 103/72 110/84  Pulse: 89 89 77 96  Resp:  19 18   Temp: 97.8 F (36.6 C) 98.1 F (36.7 C) 97.6 F (36.4 C)   TempSrc: Oral     SpO2: 98% 98% 96%   Weight:   114 kg   Height:        Intake/Output Summary (Last 24 hours) at 11/13/2023 1135 Last data filed at 11/13/2023 1051 Gross per 24 hour  Intake 700 ml  Output 2400 ml  Net -1700 ml   Filed Weights   11/11/23 0500 11/12/23 0500 11/13/23 0503  Weight: 121.6 kg 120.5 kg 114 kg    Scheduled Meds:  allopurinol   100 mg Oral Daily   amiodarone   200 mg Oral Daily   amoxicillin -clavulanate  1 tablet Oral Q12H   aspirin  EC  81 mg Oral Daily   clonazePAM   0.5 mg Oral BID   enoxaparin  (LOVENOX ) injection  40 mg Subcutaneous Q24H   midodrine   15 mg Oral TID WC   mirtazapine   15 mg Oral QHS   pantoprazole   40 mg Oral Daily   potassium chloride   40 mEq Oral QID   Continuous Infusions:  furosemide  120 mg (11/13/23 0450)    Nutritional status     Body mass index is 35.05 kg/m.  Data  Reviewed:   CBC: Recent Labs  Lab 11/07/23 1142 11/09/23 0615 11/10/23 0528 11/11/23 0519 11/12/23 0526 11/13/23 0518  WBC 9.0 8.0 10.0 11.0* 9.6 10.0  NEUTROABS 7.4  --   --   --   --   --   HGB 12.5* 12.2* 13.3 11.9* 14.4 13.0  HCT 36.9* 38.4* 41.7 35.1* 42.2 39.3  MCV 90.9 93.0 95.2 90.5 87.9 91.0  PLT 378 395 406* 353 227 339   Basic Metabolic Panel: Recent Labs  Lab 11/08/23 0706 11/09/23 0615 11/10/23 0528 11/11/23 0519 11/12/23 0526 11/13/23 0518  NA  --  139 139 132* 133* 134*  K  --  2.8* 4.8 5.1 3.3* 2.9*  CL  --  100 103 95* 91* 89*  CO2  --  28 17* 23 27 32  GLUCOSE  --  117* 90 109* 97 89  BUN  --  55* 55* 71* 60* 49*  CREATININE  --  1.59* 1.98* 2.43* 1.96* 1.69*  CALCIUM   --  9.0 9.6 9.4 8.9 9.0  MG 2.4  --   --   --   --  2.1  PHOS  --  3.7  --   --   --  3.5   GFR: Estimated Creatinine Clearance: 59.7 mL/min (A) (by C-G formula based on SCr of 1.69 mg/dL (H)). Liver Function Tests: No results for input(s): AST, ALT, ALKPHOS, BILITOT, PROT, ALBUMIN  in the last 168 hours. No results for input(s): LIPASE, AMYLASE in the last 168 hours. Recent Labs  Lab 11/10/23 0837  AMMONIA 17   Coagulation Profile: No results for input(s): INR, PROTIME in the last 168 hours. Cardiac Enzymes: No results for input(s): CKTOTAL, CKMB, CKMBINDEX, TROPONINI in the last 168 hours. BNP (last 3 results) No results for input(s): PROBNP in the last 8760 hours. HbA1C: No results for input(s): HGBA1C in the last 72 hours. CBG: Recent Labs  Lab 11/10/23 2238  GLUCAP 118*   Lipid Profile: No results for input(s): CHOL, HDL, LDLCALC, TRIG, CHOLHDL, LDLDIRECT in the last 72 hours. Thyroid Function Tests: No results for input(s): TSH, T4TOTAL, FREET4, T3FREE, THYROIDAB in the last 72 hours. Anemia Panel: No results for input(s): VITAMINB12, FOLATE, FERRITIN, TIBC, IRON, RETICCTPCT in the last 72  hours. Sepsis Labs: Recent Labs  Lab 11/07/23 1050 11/08/23 0706 11/10/23 0837 11/11/23 0838 11/11/23 1143  PROCALCITON 0.58 0.56  --   --   --   LATICACIDVEN  --   --  >9.0* 2.3* 2.4*    Recent Results (from the past 240 hours)  Resp panel by RT-PCR (RSV, Flu A&B, Covid) Anterior Nasal Swab     Status: None   Collection Time: 11/07/23 11:53 AM   Specimen: Anterior Nasal Swab  Result Value Ref Range Status   SARS Coronavirus 2 by RT PCR NEGATIVE NEGATIVE Final    Comment: (NOTE) SARS-CoV-2 target nucleic acids are NOT DETECTED.  The SARS-CoV-2 RNA is generally detectable in upper respiratory specimens during the acute phase of infection. The lowest concentration of SARS-CoV-2 viral copies this assay can detect is 138 copies/mL. A negative result does not preclude SARS-Cov-2 infection and should not be used as the sole basis for treatment or other patient management decisions. A negative result may occur with  improper specimen collection/handling, submission of specimen other than nasopharyngeal swab, presence of viral mutation(s) within the areas targeted by this assay, and inadequate number of viral copies(<138 copies/mL). A negative result must be combined with clinical observations, patient history, and epidemiological information. The expected result is Negative.  Fact Sheet for Patients:  bloggercourse.com  Fact Sheet for Healthcare Providers:  seriousbroker.it  This test is no t yet approved or cleared by the [place]  FDA and  has been authorized for detection and/or diagnosis of SARS-CoV-2 by FDA under an Emergency Use Authorization (EUA). This EUA will remain  in effect (meaning this test can be used) for the duration of the COVID-19 declaration under Section 564(b)(1) of the Act, 21 U.S.C.section 360bbb-3(b)(1), unless the authorization is terminated  or revoked sooner.       Influenza A by PCR NEGATIVE  NEGATIVE Final   Influenza B by PCR NEGATIVE NEGATIVE Final    Comment: (NOTE) The Xpert Xpress SARS-CoV-2/FLU/RSV plus assay is intended as an aid in the diagnosis of influenza from Nasopharyngeal swab specimens and should not be used as a sole basis for treatment. Nasal washings and aspirates are unacceptable for Xpert Xpress SARS-CoV-2/FLU/RSV testing.  Fact Sheet for Patients: bloggercourse.com  Fact Sheet for Healthcare  Providers: seriousbroker.it  This test is not yet approved or cleared by the [place]  FDA and has been authorized for detection and/or diagnosis of SARS-CoV-2 by FDA under an Emergency Use Authorization (EUA). This EUA will remain in effect (meaning this test can be used) for the duration of the COVID-19 declaration under Section 564(b)(1) of the Act, 21 U.S.C. section 360bbb-3(b)(1), unless the authorization is terminated or revoked.     Resp Syncytial Virus by PCR NEGATIVE NEGATIVE Final    Comment: (NOTE) Fact Sheet for Patients: bloggercourse.com  Fact Sheet for Healthcare Providers: seriousbroker.it  This test is not yet approved or cleared by the [place]  FDA and has been authorized for detection and/or diagnosis of SARS-CoV-2 by FDA under an Emergency Use Authorization (EUA). This EUA will remain in effect (meaning this test can be used) for the duration of the COVID-19 declaration under Section 564(b)(1) of the Act, 21 U.S.C. section 360bbb-3(b)(1), unless the authorization is terminated or revoked.  Performed at [facility], [address] [place], [place] [address]   MRSA Next Gen by PCR, Nasal     Status: None   Collection Time: 11/08/23  4:11 PM   Specimen: Nasal Mucosa; Nasal Swab  Result Value Ref Range Status   MRSA by PCR Next Gen NOT DETECTED NOT DETECTED Final    Comment: (NOTE) The GeneXpert MRSA Assay  (FDA approved for NASAL specimens only), is one component of a comprehensive MRSA colonization surveillance program. It is not intended to diagnose MRSA infection nor to guide or monitor treatment for MRSA infections. Test performance is not FDA approved in patients less than 10 years old. Performed at [facility], [address] [place], [place] [address]   Urine Culture (for pregnant, neutropenic or urologic patients or patients with an indwelling urinary catheter)     Status: None   Collection Time: 11/10/23 11:12 AM   Specimen: Urine, Clean Catch  Result Value Ref Range Status   Specimen Description   Final    URINE, CLEAN CATCH Performed at [facility], [address] [place], [place] [address]    Special Requests   Final    NONE Performed at [facility], [address] [place], [place] [address]    Culture   Final    NO GROWTH Performed at [facility], [address], [place], [place] [address]    Report Status 11/11/2023 FINAL  Final         Radiology Studies: No results found.         LOS: 6 days   Time spent= 35 mins    Burgess JAYSON Dare, MD [facility]  If 7PM-7AM, please contact night-coverage  11/13/2023, 11:35 AM

## 2023-11-14 ENCOUNTER — Other Ambulatory Visit (HOSPITAL_COMMUNITY): Payer: Self-pay

## 2023-11-14 DIAGNOSIS — F1021 Alcohol dependence, in remission: Secondary | ICD-10-CM | POA: Diagnosis not present

## 2023-11-14 DIAGNOSIS — I2510 Atherosclerotic heart disease of native coronary artery without angina pectoris: Secondary | ICD-10-CM

## 2023-11-14 DIAGNOSIS — R4589 Other symptoms and signs involving emotional state: Secondary | ICD-10-CM

## 2023-11-14 DIAGNOSIS — F1410 Cocaine abuse, uncomplicated: Secondary | ICD-10-CM | POA: Diagnosis not present

## 2023-11-14 DIAGNOSIS — E782 Mixed hyperlipidemia: Secondary | ICD-10-CM

## 2023-11-14 DIAGNOSIS — E669 Obesity, unspecified: Secondary | ICD-10-CM

## 2023-11-14 DIAGNOSIS — F332 Major depressive disorder, recurrent severe without psychotic features: Secondary | ICD-10-CM

## 2023-11-14 DIAGNOSIS — I5043 Acute on chronic combined systolic (congestive) and diastolic (congestive) heart failure: Secondary | ICD-10-CM

## 2023-11-14 DIAGNOSIS — J189 Pneumonia, unspecified organism: Secondary | ICD-10-CM | POA: Diagnosis not present

## 2023-11-14 DIAGNOSIS — I10 Essential (primary) hypertension: Secondary | ICD-10-CM

## 2023-11-14 DIAGNOSIS — E876 Hypokalemia: Secondary | ICD-10-CM

## 2023-11-14 DIAGNOSIS — I509 Heart failure, unspecified: Secondary | ICD-10-CM | POA: Diagnosis not present

## 2023-11-14 DIAGNOSIS — I4729 Other ventricular tachycardia: Secondary | ICD-10-CM

## 2023-11-14 LAB — BASIC METABOLIC PANEL
Anion gap: 10 (ref 5–15)
BUN: 49 mg/dL — ABNORMAL HIGH (ref 6–20)
CO2: 32 mmol/L (ref 22–32)
Calcium: 8.6 mg/dL — ABNORMAL LOW (ref 8.9–10.3)
Chloride: 93 mmol/L — ABNORMAL LOW (ref 98–111)
Creatinine, Ser: 1.46 mg/dL — ABNORMAL HIGH (ref 0.61–1.24)
GFR, Estimated: 55 mL/min — ABNORMAL LOW (ref >60)
Glucose, Bld: 103 mg/dL — ABNORMAL HIGH (ref 70–99)
Potassium: 3 mmol/L — ABNORMAL LOW (ref 3.5–5.1)
Sodium: 135 mmol/L (ref 135–145)

## 2023-11-14 LAB — CBC
HCT: 34.5 % — ABNORMAL LOW (ref 39.0–52.0)
Hemoglobin: 11.5 g/dL — ABNORMAL LOW (ref 13.0–17.0)
MCH: 29.9 pg (ref 26.0–34.0)
MCHC: 33.3 g/dL (ref 30.0–36.0)
MCV: 89.6 fL (ref 80.0–100.0)
Platelets: 334 10*3/uL (ref 150–400)
RBC: 3.85 MIL/uL — ABNORMAL LOW (ref 4.22–5.81)
RDW: 16.2 % — ABNORMAL HIGH (ref 11.5–15.5)
WBC: 7.9 10*3/uL (ref 4.0–10.5)
nRBC: 0 % (ref 0.0–0.2)

## 2023-11-14 LAB — MAGNESIUM: Magnesium: 2 mg/dL (ref 1.7–2.4)

## 2023-11-14 MED ORDER — METOLAZONE 5 MG PO TABS
ORAL_TABLET | ORAL | 0 refills | Status: DC
  Filled 2023-11-14: qty 60, fill #0

## 2023-11-14 MED ORDER — PANTOPRAZOLE SODIUM 40 MG PO TBEC
40.0000 mg | DELAYED_RELEASE_TABLET | Freq: Every day | ORAL | 0 refills | Status: AC
  Filled 2023-11-14: qty 30, 30d supply, fill #0

## 2023-11-14 MED ORDER — POTASSIUM CHLORIDE CRYS ER 20 MEQ PO TBCR
40.0000 meq | EXTENDED_RELEASE_TABLET | Freq: Once | ORAL | Status: AC
  Administered 2023-11-14: 40 meq via ORAL
  Filled 2023-11-14: qty 2

## 2023-11-14 MED ORDER — METOLAZONE 5 MG PO TABS: ORAL_TABLET | ORAL | 0 refills | Status: AC

## 2023-11-14 MED ORDER — METOLAZONE 5 MG PO TABS
5.0000 mg | ORAL_TABLET | Freq: Once | ORAL | Status: DC
Start: 2023-11-14 — End: 2023-11-14
  Filled 2023-11-14: qty 1

## 2023-11-14 MED ORDER — METOLAZONE 5 MG PO TABS
5.0000 mg | ORAL_TABLET | Freq: Once | ORAL | Status: AC
  Administered 2023-11-14: 5 mg via ORAL
  Filled 2023-11-14: qty 1

## 2023-11-14 MED ORDER — METOLAZONE 5 MG PO TABS
ORAL_TABLET | ORAL | 0 refills | Status: DC
  Filled 2023-11-14: qty 60, 30d supply, fill #0

## 2023-11-14 MED ORDER — ACETAMINOPHEN 325 MG PO TABS
650.0000 mg | ORAL_TABLET | Freq: Four times a day (QID) | ORAL | 0 refills | Status: AC | PRN
  Filled 2023-11-14: qty 20, 3d supply, fill #0

## 2023-11-14 MED ORDER — POTASSIUM CHLORIDE CRYS ER 20 MEQ PO TBCR
EXTENDED_RELEASE_TABLET | ORAL | 0 refills | Status: AC
  Filled 2023-11-14: qty 120, 27d supply, fill #0

## 2023-11-14 MED ORDER — TORSEMIDE 100 MG PO TABS: 100.0000 mg | ORAL_TABLET | Freq: Two times a day (BID) | ORAL | 0 refills | Status: AC

## 2023-11-14 MED ORDER — POTASSIUM CHLORIDE CRYS ER 20 MEQ PO TBCR
40.0000 meq | EXTENDED_RELEASE_TABLET | Freq: Two times a day (BID) | ORAL | Status: DC
  Administered 2023-11-14: 40 meq via ORAL
  Filled 2023-11-14: qty 2

## 2023-11-14 NOTE — Progress Notes (Signed)
 Discharge instructions reviewed with patient. Patient required more than 30 min of teaching and teach back, very poor health literacy and comprehension. Patient given a schedule for medications along with times for medications. All questions answered. All belongings accounted for. Patient to follow up with [facility]. Patient medications hand delivered from outpatient pharmacy, as well as inpatient pharmacy meds that were from patient on admit. PIV removed. Assisted via WC to taxi

## 2023-11-14 NOTE — TOC Transition Note (Signed)
 Transition of Care [facility]) - Discharge Note   Patient Details  Name: Dale [name] MRN: [id number] Date of Birth: [birth year]  Transition of Care [facility]) CM/SW Contact:  Hoy DELENA Bigness, LCSW Phone Number: 11/14/2023, 3:48 PM   Clinical Narrative:    Pt to discharge to stay with mother. Pt will be staying at address: [address] In [place]. Pt does not have transportation to discharge address. Ride waiver signed by pt and taxi voucher placed at [employer] station. Pt recommended for outpatient palliative care and pt agreeable to this recommendation. Pt does not have preference for palliative care agency. Referral has been made to [facility].      Barriers to Discharge: Continued Medical Work up, Homeless with medical needs, Unsafe home situation   Patient Goals and CMS Choice Patient states their goals for this hospitalization and ongoing recovery are:: To get into housing through the [employer].gov Compare Post Acute Care list provided to:: Patient Choice offered to / list presented to : Patient [place] ownership interest in [facility].provided to::  (NA)    Discharge Placement                       Discharge Plan and Services Additional resources added to the After Visit Summary for   In-house Referral: Clinical Social Work Discharge Planning Services: NA Post Acute Care Choice: NA                               Social Drivers of Health (SDOH) Interventions SDOH Screenings   Food Insecurity: Food Insecurity Present (11/07/2023)  Housing: High Risk (11/07/2023)  Transportation Needs: Unmet Transportation Needs (11/07/2023)  Utilities: Not At Risk (11/07/2023)  Alcohol  Screen: High Risk (02/20/2022)  Depression (PHQ2-9): Medium Risk (05/05/2019)  Financial Resource Strain: High Risk (08/25/2023)  Tobacco Use: Low Risk  (11/07/2023)     Readmission Risk Interventions    11/10/2023    3:36 PM 10/20/2023    3:32 PM 10/19/2023    2:46 PM   Readmission Risk Prevention Plan  Transportation Screening Complete Complete Complete  Medication Review Oceanographer) Complete Complete   PCP or Specialist appointment within 3-5 days of discharge Complete Complete --  HRI or Home Care Consult Complete Complete Not Complete  HRI or Home Care Consult Pt Refusal Comments   Homeless  SW Recovery Care/Counseling Consult Complete Complete Complete  Palliative Care Screening Complete Complete   Skilled Nursing Facility Not Applicable Not Applicable

## 2023-11-14 NOTE — Progress Notes (Signed)
 Patient refused Telemetry monitoring "stated that he is going home today and they took it off since last night."   Patient is sitting on side of bed eating breakfast.  Juleen Starr RN

## 2023-11-14 NOTE — Progress Notes (Signed)
 Physical Therapy Treatment Patient Details Name: Dale [name] MRN: [id number] DOB: 1960-12-27 Today's Date: 11/14/2023   History of Present Illness 63 yo male presents to therapy following hospital admission on 11/06/2022 due to progressive SOB, B LE edema and volume overload as well as suicidal ideation. Pt was found to have PNA and CHF exacerbation. Pt was recently hospitalized in Dec 2024 for 20 days due to CHF, cardiogenic shock, AKI, VF arrest, suicidal ideation and required mechanical ventilation. Pt PMH includes but is not limited to: substance abuse, CAD s/p cath, s and dCHF, CKD III, HLD, HTN, cardiomyopathy, PTSD, depression, GERD, and mixed NICM/ICM.    PT Comments   Pt admitted with above diagnosis.  Pt currently with functional limitations due to the deficits listed below (see PT Problem List). Pt seated in recliner. Pt stated he is planning on d/c today and transitioning to his mothers home for a few days. Pt reports that he wants to take his medication because it was very scary not being able to breathe and does not want that to happen again. Pt agreeable to therapy session. Pt is mod I for transfers with and without AD, gait tasks with S and min cues, pt demonstrated improved cervical extension with mobility tasks today and reported SOB with gait 250 feet with RW and O2 94% on RA. Pt left seated in recliner and all needs met. Pt will benefit from acute skilled PT to increase their independence and safety with mobility to allow discharge.      If plan is discharge home, recommend the following: A little help with walking and/or transfers;A little help with bathing/dressing/bathroom;Assistance with cooking/housework;Assist for transportation;Help with stairs or ramp for entrance   Can travel by private vehicle        Equipment Recommendations  Rolling walker (2 wheels) (if pt doesn't still have one)    Recommendations for Other Services       Precautions / Restrictions  Precautions Precautions: Fall Precaution Comments: monitor O2 Restrictions Weight Bearing Restrictions Per Provider Order: No     Mobility  Bed Mobility               General bed mobility comments: oob in recliner    Transfers Overall transfer level: Modified independent Equipment used: Rolling walker (2 wheels) Transfers: Sit to/from Stand Sit to Stand: Modified independent (Device/Increase time)                Ambulation/Gait Ambulation/Gait assistance: Supervision Gait Distance (Feet): 250 Feet Assistive device: Rolling walker (2 wheels) Gait Pattern/deviations: Decreased stride length, Step-to pattern, Narrow base of support Gait velocity: decreased     General Gait Details: cues for cervical extension and pursed lip breathing and pt reported SOB with O2 saturation 94% on RA   Stairs             Wheelchair Mobility     Tilt Bed    Modified Rankin (Stroke Patients Only)       Balance Overall balance assessment: Mild deficits observed, not formally tested                                          Cognition Arousal: Alert Behavior During Therapy: WFL for tasks assessed/performed, Flat affect Overall Cognitive Status: Within Functional Limits for tasks assessed  Exercises      General Comments        Pertinent Vitals/Pain Pain Assessment Pain Assessment: No/denies pain    Home Living                          Prior Function            PT Goals (current goals can now be found in the care plan section) Acute Rehab PT Goals Patient Stated Goal: to be able to do more for myself and breathe better PT Goal Formulation: With patient Time For Goal Achievement: 11/26/23 Potential to Achieve Goals: Good Progress towards PT goals: Progressing toward goals    Frequency    Min 1X/week      PT Plan      Co-evaluation               AM-PAC PT 6 Clicks Mobility   Outcome Measure  Help needed turning from your back to your side while in a flat bed without using bedrails?: None Help needed moving from lying on your back to sitting on the side of a flat bed without using bedrails?: None Help needed moving to and from a bed to a chair (including a wheelchair)?: None Help needed standing up from a chair using your arms (e.g., wheelchair or bedside chair)?: None Help needed to walk in hospital room?: A Little Help needed climbing 3-5 steps with a railing? : A Little 6 Click Score: 22    End of Session Equipment Utilized During Treatment: Gait belt Activity Tolerance: Patient limited by fatigue Patient left: in chair;with call bell/phone within reach Nurse Communication: Mobility status PT Visit Diagnosis: Unsteadiness on feet (R26.81);Other abnormalities of gait and mobility (R26.89);Muscle weakness (generalized) (M62.81);Difficulty in walking, not elsewhere classified (R26.2)     Time: 8889-8876 PT Time Calculation (min) (ACUTE ONLY): 13 min  Charges:    $Gait Training: 8-22 mins PT General Charges $$ ACUTE PT VISIT: 1 Visit                     [place], PT Acute Rehab    [place] VEAR Drone 11/14/2023, 12:20 PM

## 2023-11-14 NOTE — Progress Notes (Signed)
 Chaplain attempted again to assist with HCPOA documentation, but Champion was asleep.

## 2023-11-14 NOTE — Progress Notes (Signed)
 Occupational Therapy Treatment Patient Details Name: Lynne Righi MRN: [id number] DOB: Nov 17, 1960 Today's Date: 11/14/2023   History of present illness 63 yo male presents to therapy following hospital admission on 11/06/2022 due to progressive SOB, B LE edema and volume overload as well as suicidal ideation. Pt was found to have PNA and CHF exacerbation. Pt was recently hospitalized in Dec 2024 for 20 days due to CHF, cardiogenic shock, AKI, VF arrest, suicidal ideation and required mechanical ventilation. Pt PMH includes but is not limited to: substance abuse, CAD s/p cath, s and dCHF, CKD III, HLD, HTN, cardiomyopathy, PTSD, depression, GERD, and mixed NICM/ICM.   OT comments  Pt making good progress with functional goals. Pt educated on ADL A/E for LB and energy conservation techniques with handouts provided. Pt eager to d/c today      If plan is discharge home, recommend the following:  Assist for transportation;A little help with walking and/or transfers;A little help with bathing/dressing/bathroom   Equipment Recommendations  None recommended by OT (sock aid, reacher)    Recommendations for Other Services      Precautions / Restrictions Precautions Precautions: Fall Precaution Comments: monitor O2 Restrictions Weight Bearing Restrictions Per Provider Order: No       Mobility Bed Mobility               General bed mobility comments: oob in recliner    Transfers   Equipment used: Rolling walker (2 wheels) Transfers: Sit to/from Stand Sit to Stand: Supervision, Modified independent (Device/Increase time)                 Balance Overall balance assessment: Mild deficits observed, not formally tested                                         ADL either performed or assessed with clinical judgement   ADL Overall ADL's : Needs assistance/impaired     Grooming: Wash/dry hands;Wash/dry face;Modified independent;Standing       Lower  Body Bathing: Supervison/ safety Lower Body Bathing Details (indicate cue type and reason): simulated     Lower Body Dressing: Supervision/safety Lower Body Dressing Details (indicate cue type and reason): pt able to donn LB clothing, socks and shoes with no physical assist Toilet Transfer: Modified Independent   Toileting- Clothing Manipulation and Hygiene: Modified independent         General ADL Comments: pt edcuated on ADL A/E  for LB and energy conservation techniques with handouts provided. Pt able to donn LB clothing, socks and shoes with no physical assist    Extremity/Trunk Assessment Upper Extremity Assessment Upper Extremity Assessment: Overall WFL for tasks assessed   Lower Extremity Assessment Lower Extremity Assessment: Defer to PT evaluation   Cervical / Trunk Assessment Cervical / Trunk Assessment: Normal    Vision Ability to See in Adequate Light: 0 Adequate Patient Visual Report: No change from baseline     Perception     Praxis      Cognition Arousal: Alert Behavior During Therapy: WFL for tasks assessed/performed, Flat affect Overall Cognitive Status: Within Functional Limits for tasks assessed                                          Exercises      Shoulder Instructions  General Comments      Pertinent Vitals/ Pain       Pain Assessment Pain Assessment: 0-10 Pain Score: 4  Pain Location: back Pain Descriptors / Indicators: Aching Pain Intervention(s): Monitored during session, Repositioned  Home Living                                          Prior Functioning/Environment              Frequency  Min 1X/week        Progress Toward Goals  OT Goals(current goals can now be found in the care plan section)  Progress towards OT goals: Progressing toward goals     Plan      Co-evaluation                 AM-PAC OT 6 Clicks Daily Activity     Outcome Measure   Help from  another person eating meals?: None Help from another person taking care of personal grooming?: None Help from another person toileting, which includes using toliet, bedpan, or urinal?: None Help from another person bathing (including washing, rinsing, drying)?: A Little Help from another person to put on and taking off regular upper body clothing?: None Help from another person to put on and taking off regular lower body clothing?: A Little 6 Click Score: 22    End of Session Equipment Utilized During Treatment: Gait belt;Rolling walker (2 wheels)  OT Visit Diagnosis: Other abnormalities of gait and mobility (R26.89);Muscle weakness (generalized) (M62.81)   Activity Tolerance Patient tolerated treatment well   Patient Left in chair;with call bell/phone within reach;with nursing/sitter in room   Nurse Communication          Time: 8990-8966 OT Time Calculation (min): 24 min  Charges: OT General Charges $OT Visit: 1 Visit OT Treatments $Self Care/Home Management : 8-22 mins $Therapeutic Activity: 8-22 mins    Jacques Karna Loose 11/14/2023, 11:36 AM

## 2023-11-14 NOTE — Plan of Care (Signed)
  Problem: Education: Goal: Knowledge of General Education information will improve Description: Including pain rating scale, medication(s)/side effects and non-pharmacologic comfort measures Outcome: Progressing   Problem: Clinical Measurements: Goal: Ability to maintain clinical measurements within normal limits will improve Outcome: Progressing Goal: Will remain free from infection Outcome: Progressing   Problem: Activity: Goal: Risk for activity intolerance will decrease Outcome: Progressing   Problem: Nutrition: Goal: Adequate nutrition will be maintained Outcome: Progressing   Problem: Pain Management: Goal: General experience of comfort will improve Outcome: Progressing   Problem: Safety: Goal: Ability to remain free from injury will improve Outcome: Progressing   Problem: Cardiac: Goal: Ability to achieve and maintain adequate cardiopulmonary perfusion will improve Outcome: Progressing

## 2023-11-14 NOTE — Discharge Summary (Signed)
 Physician Discharge Summary   Patient: Dale [name] MRN: [id number] DOB: August 25, 1961  Admit date:     11/07/2023  Discharge date: 11/14/23  Discharge Physician: Alejandro Marker, DO   PCP: Pcp, No   Recommendations at discharge:   Follow-up with PCP within 1 to 2 weeks and repeat CBC, CMP, mag, Phos within 1 week Follow-up with cardiology in outpatient setting within 1 to 2 weeks and continue diuretics as recommended Avoid illicit drug use and cocaine use Follow-up with palliative care in the outpatient setting  Discharge Diagnoses: Principal Problem:   HCAP (healthcare-associated pneumonia) Active Problems:   Acute renal failure superimposed on chronic kidney disease (HCC)   Alcohol  use disorder, moderate, in early remission (HCC)   Essential hypertension   Obesity (BMI 30-39.9)   Cocaine abuse (HCC)   Normochromic anemia   Hyperlipidemia   NSVT (nonsustained ventricular tachycardia) (HCC)   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 [facility] Course: The patient is a 63 year old with history of alcohol  abuse, CAD, cardiorenal syndrome, CHF with reduced EF, ischemic cardiomyopathy, stage IV CKD, cocaine use, HLD, HTN, morbid obesity, medication noncompliance was recently admitted to the hospital for 20 days for acute respiratory failure complicated by CHF exacerbation and AKI.  Upon admission noted to be in CHF exacerbation again and chest x-ray showing right lower lobe consolidation.  Upon admission patient started on IV antibiotics and  diuretics.  Initially felt better thereafter complicated by AKI and acidosis.  Seen by cardiology but unfortunately does not have much options at this point.  Patient was continued on diuretics and received high doses of IV Lasix  and Zaroxolyn  while hospitalized and received a dose of Zaroxolyn  today.  Psychiatry was also consulted and they recommended discharging the patient home with outpatient follow-up given that he did not have any behaviors that appeared to be an imminent risk to himself or others.  The case was discussed with the cardiologist Dr. Raford and because the patient had a place to go he is felt that he could be transition to oral diuretics with close PCP and cardiology follow-up with outpatient referral to palliative care given his end-stage heart failure.  If the patient did not have a place to go cardiology had recommended continuing IV diuresis but since he had a bed with his mom and [facility] that will be much more socially acceptable for him he will be discharged home on 100 mg of torsemide  twice daily with an additional 5 mg of metolazone  twice per week on Mondays and Fridays with an additional 40 mill colons of KCl.  At this time cardiology feels that he appears medically stable for discharge and will be discharged home with his mother and will need to follow-up with PCP and cardiology in outpatient setting with outpatient palliative care referral.  Assessment & Plan:  Principal Problem:   Right lower lobe pneumonia Active Problems:   Alcohol  use disorder, moderate, in early remission (HCC)   Essential hypertension   Obesity (BMI 30-39.9)   Cocaine abuse (HCC)   Normochromic anemia  Hyperlipidemia   NSVT (nonsustained ventricular tachycardia) (HCC)   Acute on chronic combined systolic and diastolic HF (heart failure) (HCC)   Coronary artery disease involving native coronary artery of native heart without angina pectoris   Hypokalemia   MDD (major depressive  disorder), recurrent severe, without psychosis (HCC)    AKI on CKD stage IIIb Severe metabolic acidosis -Baseline creatinine 1.9.  Creatinine peaked at 2.43 but improving with diuretics. -BUN/Cr Trend: Recent Labs  Lab 11/07/23 1050 11/09/23 0615 11/10/23 0528 11/11/23 0519 11/12/23 0526 11/13/23 0518 11/14/23 0552  BUN 63* 55* 55* 71* 60* 49* 49*  CREATININE 2.14* 1.59* 1.98* 2.43* 1.96* 1.69* 1.46*  -Metabolic acidosis improved and he has a CO2 of 32, anion gap of 10, chloride level 93 -Cardiorenal/hypoperfusion leading to a lot of his issues. -Avoid Nephrotoxic Medications, Contrast Dyes, Hypotension and Dehydration to Ensure Adequate Renal Perfusion and will need to Renally Adjust Meds -Continue to Monitor and Trend Renal Function carefully and repeat CMP within 1 week  Acute on chronic combined systolic and diastolic HF (heart failure) (HCC) Bilateral lower extremity swelling -Recent echocardiogram in December 2024 showed EF less than 20% with severely reduced LV function, global hypokinesia, grade 2 DD with dilated cardiomyopathy.  Not a candidate for any advanced cardiac medical therapy at this point.  Very poor compliance. - On Lasix  120 mg twice daily while hospitalized.  Intermittently getting metaxalone.  Good response to this but still remained volume overloaded.  Discussion with cardiology was had and because patient has a bed to go they feel that he can still be discharged on oral diuretics with close PCP follow-up as well as cardiology follow-up.  He is transition to oral torsemide  100 mg twice daily with additional 5 mg of Zaroxolyn  on Monday Fridays with 40 mill colons of KCl at that time No intake or output data in the 24 hours ending 11/16/23 2212  -Given his end-stage heart failure he will be referred to palliative care in outpatient setting  Nausea vomiting - X-rays negative.  Suspect from bowel wall edema.  Antiemetics as needed continue  diuresis -Improved  Right lower lobe pneumonia -Chest x-ray showing right lower lobe consolidation.  Procalcitonin 0.58.  De-escalate vancomycin /cefepime  to Augmentin  for 5 more days to complete total 7-day course.  EOT 1/9. -Respiratory status is stable now  SpO2: 98 % O2 Flow Rate (L/min): 18 L/min FiO2 (%): 21 %; Off of Supplemental O2 altogether and tolerating Room Air  Hypokalemia -Patient's K+ Level Trend: Recent Labs  Lab 11/07/23 1050 11/09/23 0615 11/10/23 0528 11/11/23 0519 11/12/23 0526 11/13/23 0518 11/14/23 0552  K 3.5 2.8* 4.8 5.1 3.3* 2.9* 3.0*  -Replete with p.o. KCl 40 mill colons 3 times daily today and will need twice daily dosing going forward given his diuretics and will take an additional 40 mill equivalents with her Zaroxolyn  doses -Continue to Monitor and Replete as Necessary -Repeat CMP in the AM   NSVT (nonsustained ventricular tachycardia) (HCC)  Per documentation recently had V-fib arrest.  Currently on amiodarone  200 mg daily. -Will need close cardiology follow-up in outpatient setting   Coronary artery disease -Continue aspirin , off statin -Unable to tolerate beta-blockers -Will need close cardiology follow-up in outpatient setting   MDD (major depressive disorder),  recurrent severe, without psychosis (HCC) -Continue bedtime mirtazapine .  Klonopin  twice daily. -Psychiatry consulted and felt no changes in the medications recommends discharging him with outpatient resources   Essential hypertension -On Lasix .  Requiring midodrine  -Follow-up with cardiology outpatient setting  Alcohol  use disorder, moderate, in early remission [facility]) The patient stated he has not been drinking.   Tobacco abuse Tobacco cessation advised. Nicotine replacement therapy ordered.   Cocaine abuse (HCC) -In remission.  His last UDS was negative for cocaine   Normocytic Anemia -Hgb/Hct Trend: Recent Labs  Lab 11/07/23 1142 11/09/23 0615 11/10/23 0528  11/11/23 0519 11/12/23 0526 11/13/23 0518 11/14/23 0552  HGB 12.5* 12.2* 13.3 11.9* 14.4 13.0 11.5*  HCT 36.9* 38.4* 41.7 35.1* 42.2 39.3 34.5*  MCV 90.9 93.0 95.2 90.5 87.9 91.0 89.6  -Outpatient anemia panel and close follow-up with PCP   Hyperlipidemia -Off statin. -Follow-up with PCP  Class II Obesity -Complicates overall prognosis and care -Estimated body mass index is 35.33 kg/m as calculated from the following:   Height as of this encounter: 5' 11 (1.803 m).   Weight as of this encounter: 114.9 kg.  -Weight Loss and Dietary Counseling given  GOC Seen by palliative care, overall poor prognosis. Seen by [facility] for safe Dispo patient will be going home with his mother [facility]  Consultants: Cardiology, Palliative Care Medicine Procedures performed: As delineated as above  Disposition: Home Diet recommendation:  Discharge Diet Orders (From admission, onward)     Start     Ordered   11/14/23 0000  Diet - low sodium heart healthy       Comments: 1200 mL Fluid Restriction   11/14/23 1523           Cardiac diet DISCHARGE MEDICATION: Allergies as of 11/14/2023       Reactions   Hydrocodone  Itching        Medication List     TAKE these medications    acetaminophen  325 MG tablet Commonly known as: TYLENOL  Take 2 tablets (650 mg total) by mouth every 6 (six) hours as needed for mild pain (pain score 1-3) (or Fever >/= 101).   allopurinol  100 MG tablet Commonly known as: ZYLOPRIM  Take 1 tablet (100 mg total) by mouth daily.   amiodarone  200 MG tablet Commonly known as: PACERONE  Take 1 tablet (200 mg total) by mouth daily.   aspirin  EC 81 MG tablet Take 1 tablet (81 mg total) by mouth daily. Swallow whole.   clonazePAM  0.5 MG tablet Commonly known as: KLONOPIN  Take 1 tablet (0.5 mg total) by mouth 2 (two) times daily.   metolazone  5 MG tablet Commonly known as: ZAROXOLYN  Take Mondays and Fridays; When taking Metolazone  take an extra  40 mEQ of KCL with dose   midodrine  5 MG tablet Commonly known as: PROAMATINE  Take 3 tablets (15 mg total) by mouth 3 (three) times daily with meals.   mirtazapine  15 MG disintegrating tablet Commonly known as: REMERON  SOL-TAB Take 1 tablet (15 mg total) by mouth at bedtime.   pantoprazole  40 MG tablet Commonly known as: PROTONIX  Take 1 tablet (40 mg total) by mouth daily.   potassium chloride  SA 20 MEQ tablet Commonly known as: KLOR-CON  M Take 2 tablets (40 mEQ) twice daily but on Mondays and Fridays when Taking Metolazone  take an additional 2 tablets (40 mEQ) at that time   torsemide  100 MG tablet Commonly known as: DEMADEX  Take 1 tablet (100 mg total) by mouth 2 (two) times daily. What changed:  medication strength how much to take when to take this        Follow-up Information     [facility] Follow up.   Why: Please call to follow up regarding transitional housing  Contact information: [address] [facility] [place] [address] [phone]         [facility] Follow up.   Why: [facility] will follow up with you at discharge to provide outpatient palliative care services Contact information: [address] [facility] [place]  [address] [phone]               Discharge Exam: Filed Weights   11/12/23 0500 11/13/23 0503 11/14/23 0500  Weight: 120.5 kg 114 kg 114.9 kg   Vitals:   11/14/23 0844 11/14/23 1147  BP: 113/81 (!) 121/90  Pulse: 92 88  Resp: 16   Temp: (!) 97.5 F (36.4 C)   SpO2: 98%    Examination: Physical Exam:  Constitutional: WN/WD obese chronically appearing African-American male in no acute distress Respiratory: Diminished to auscultation bilaterally with some coarse breath sounds and some slight rhonchi or crackles but no appreciable wheezing or rales. Normal respiratory effort and patient is not tachypenic. No accessory muscle use.  Unlabored breathing Cardiovascular: RRR, no  murmurs / rubs / gallops. S1 and S2 auscultated.  1+ lower extremity edema Abdomen: Soft, non-tender, distended secondary body habitus. Bowel sounds positive.  GU: Deferred. Musculoskeletal: No clubbing / cyanosis of digits/nails. No joint deformity upper and lower extremities. Skin: No rashes, lesions, ulcers limited skin evaluation. No induration; Warm and dry.  Neurologic: CN 2-12 grossly intact with no focal deficits. Romberg sign and cerebellar reflexes not assessed.  Psychiatric: Normal judgment and insight. Alert and oriented x 3. Normal mood and appropriate affect.   Condition at discharge: stable  The results of significant diagnostics from this hospitalization (including imaging, microbiology, ancillary and laboratory) are listed below for reference.   Imaging Studies: DG Abd 1 View Result Date: 11/11/2023 CLINICAL DATA:  Nausea, vomiting. EXAM: ABDOMEN - 1 VIEW COMPARISON:  October 24, 2023. FINDINGS: No abnormal bowel dilatation is noted. Contrast is noted in nondilated colon. IMPRESSION: No abnormal bowel dilatation. Electronically Signed   By: Lynwood Landy Raddle M.D.   On: 11/11/2023 10:14   DG Chest Port 1 View Result Date: 11/10/2023 CLINICAL DATA:  Dyspnea.  Follow-up study. EXAM: PORTABLE CHEST 1 VIEW COMPARISON:  11/07/2023 FINDINGS: Moderate enlargement of the cardiopericardial silhouette, stable from the previous exam. No mediastinal or hilar masses. Mild patchy opacity at the right lung base is similar to the prior study consistent with a small area of pneumonia or atelectasis. Remainder of the lungs is clear. No convincing pleural effusion and no pneumothorax. IMPRESSION: 1. No convincing change from the most recent prior exam. 2. Mild opacity at the right lung base which may reflect atelectasis or pneumonia. 3. Stable enlargement of the cardiopericardial silhouette from the previous study. Consider a pericardial effusion in the proper clinical setting, which would be better  assessed with chest CT. Electronically Signed   By: Alm Parkins M.D.   On: 11/10/2023 08:21   DG Chest 2 View Result Date: 11/07/2023 CLINICAL DATA:  shob EXAM: CHEST - 2 VIEW COMPARISON:  Chest x-ray 07/25/2023, CT chest 05/11/2021 FINDINGS: Persistent cardiomegaly. The heart and mediastinal contours are unchanged. Right lower lobe consolidation. No pulmonary edema. No pleural effusion. No pneumothorax. No acute osseous abnormality. IMPRESSION: Right lower lobe consolidation. Followup PA and lateral chest X-ray is recommended in 3-4 weeks following trial of antibiotic therapy to ensure resolution and exclude underlying malignancy. Electronically Signed   By: Morgane  Naveau M.D.   On: 11/07/2023 02:41   VAS US  LOWER EXTREMITY VENOUS (DVT) Result Date: 11/02/2023  Lower  Venous DVT Study Patient Name:  SEGER JANI  Date of Exam:   11/01/2023 Medical Rec #: [id number]            Accession #:    [id number] Date of Birth: 1961/08/21            Patient Gender: M Patient Age:   28 years Exam Location:  [facility] Procedure:      VAS US  LOWER EXTREMITY VENOUS (DVT) Referring Phys: OWEN REGALADO --------------------------------------------------------------------------------  Indications: Swelling, and Edema.  Comparison Study: Previous study on 12.12.2024. Performing Technologist: Edilia Elden Appl  Examination Guidelines: A complete evaluation includes B-mode imaging, spectral Doppler, color Doppler, and power Doppler as needed of all accessible portions of each vessel. Bilateral testing is considered an integral part of a complete examination. Limited examinations for reoccurring indications may be performed as noted. The reflux portion of the exam is performed with the patient in reverse Trendelenburg.  +---------+---------------+---------+-----------+----------+-------------------+ RIGHT    CompressibilityPhasicitySpontaneityPropertiesThrombus Aging       +---------+---------------+---------+-----------+----------+-------------------+ CFV      Full           Yes      Yes                                      +---------+---------------+---------+-----------+----------+-------------------+ SFJ      Full           Yes      Yes                                      +---------+---------------+---------+-----------+----------+-------------------+ FV Prox  Full                                                             +---------+---------------+---------+-----------+----------+-------------------+ FV Mid   Full                                                             +---------+---------------+---------+-----------+----------+-------------------+ FV DistalFull                                                             +---------+---------------+---------+-----------+----------+-------------------+ PFV      Full                                                             +---------+---------------+---------+-----------+----------+-------------------+ POP      Full           Yes      Yes                                      +---------+---------------+---------+-----------+----------+-------------------+  PTV      Full                                                             +---------+---------------+---------+-----------+----------+-------------------+ PERO                                                  Limited, patent by                                                        color.              +---------+---------------+---------+-----------+----------+-------------------+   +----+---------------+---------+-----------+----------+--------------+ LEFTCompressibilityPhasicitySpontaneityPropertiesThrombus Aging +----+---------------+---------+-----------+----------+--------------+ CFV Full           Yes      Yes                                  +----+---------------+---------+-----------+----------+--------------+ SFJ Full           Yes      Yes                                 +----+---------------+---------+-----------+----------+--------------+    Summary: RIGHT: - There is no evidence of deep vein thrombosis in the lower extremity.  - No cystic structure found in the popliteal fossa.  LEFT: - No evidence of common femoral vein obstruction.   *See table(s) above for measurements and observations. Electronically signed by Debby Robertson on 11/02/2023 at 12:29:45 PM.    Final    DG Abd 1 View Result Date: 10/24/2023 CLINICAL DATA:  Nasogastric tube placement. EXAM: ABDOMEN - 1 VIEW COMPARISON:  Yesterday FINDINGS: Enteric tube with tip at the stomach and side port near the GE junction. No gas dilated bowel. Clear lung bases. Endotracheal tube with tip between the clavicular heads and carina. IMPRESSION: Enteric tube with tip at the stomach and side port near the GE junction. Electronically Signed   By: Dorn Roulette M.D.   On: 10/24/2023 07:29   DG Chest Port 1 View Result Date: 10/24/2023 CLINICAL DATA:  Acute respiratory failure EXAM: PORTABLE CHEST 1 VIEW COMPARISON:  Earlier today FINDINGS: Endotracheal tube with tip 2.5 cm above the carina. An enteric tube reaches the stomach. Poor visualization of retrocardiac structures due to the degree of cardiomegaly and underpenetration. Congested appearance of central vessels. No edema, visible effusion, or pneumothorax. Artifact from support hardware. IMPRESSION: Unremarkable hardware as described. Cardiomegaly with vascular congestion. Electronically Signed   By: Dorn Roulette M.D.   On: 10/24/2023 07:21   DG Chest Port 1 View Result Date: 10/24/2023 CLINICAL DATA:  Shortness of breath EXAM: PORTABLE CHEST 1 VIEW COMPARISON:  Yesterday FINDINGS: Under penetrated study, no convincing interval infiltrate. Chronic cardiomegaly with mediastinal distortion by leftward rotation. No Kerley  lines, visible effusion, or pneumothorax. IMPRESSION: Stable aeration and cardiomegaly.  No interval finding. Electronically Signed  By: Dorn Roulette M.D.   On: 10/24/2023 05:03   DG Abd Portable 1V Result Date: 10/23/2023 CLINICAL DATA:  Nausea and vomiting. EXAM: PORTABLE ABDOMEN - 1 VIEW COMPARISON:  X-ray 10/21/2023. FINDINGS: Gas seen in nondilated loops of small and large bowel. Scattered colonic stool diffusely. Bowel gas is decreased from previous. No frank obstruction. No obvious free air on this portable supine radiograph. Degenerative changes of the spine and pelvis. Vascular calcifications. IMPRESSION: Nonspecific bowel gas pattern.  Scattered stool. Electronically Signed   By: Ranell Bring M.D.   On: 10/23/2023 15:58   DG CHEST PORT 1 VIEW Result Date: 10/23/2023 CLINICAL DATA:  Intractable nausea and vomiting EXAM: PORTABLE CHEST 1 VIEW COMPARISON:  Chest x-ray 10/13/2023 and older. FINDINGS: Enlarged cardiopericardial silhouette. Slight prominence of the central vasculature. No consolidation, pneumothorax or effusion. No edema. Film is under penetrated. IMPRESSION: Enlarged cardiopericardial silhouette with some slight vascular congestion. Electronically Signed   By: Ranell Bring M.D.   On: 10/23/2023 15:55   DG Abd Portable 1V Result Date: 10/21/2023 CLINICAL DATA:  [id number] Nausea and vomiting [id number] EXAM: PORTABLE ABDOMEN - 1 VIEW COMPARISON:  06/13/2023 FINDINGS: The bowel gas pattern is normal. No radio-opaque calculi or other significant radiographic abnormality are seen. Degenerative changes of both hips, left worse than right. IMPRESSION: Negative. Electronically Signed   By: Mabel Converse D.O.   On: 10/21/2023 19:33   Microbiology: Results for orders placed or performed during the hospital encounter of 11/07/23  Resp panel by RT-PCR (RSV, Flu A&B, Covid) Anterior Nasal Swab     Status: None   Collection Time: 11/07/23 11:53 AM   Specimen: Anterior Nasal Swab  Result  Value Ref Range Status   SARS Coronavirus 2 by RT PCR NEGATIVE NEGATIVE Final    Comment: (NOTE) SARS-CoV-2 target nucleic acids are NOT DETECTED.  The SARS-CoV-2 RNA is generally detectable in upper respiratory specimens during the acute phase of infection. The lowest concentration of SARS-CoV-2 viral copies this assay can detect is 138 copies/mL. A negative result does not preclude SARS-Cov-2 infection and should not be used as the sole basis for treatment or other patient management decisions. A negative result may occur with  improper specimen collection/handling, submission of specimen other than nasopharyngeal swab, presence of viral mutation(s) within the areas targeted by this assay, and inadequate number of viral copies(<138 copies/mL). A negative result must be combined with clinical observations, patient history, and epidemiological information. The expected result is Negative.  Fact Sheet for Patients:  bloggercourse.com  Fact Sheet for Healthcare Providers:  seriousbroker.it  This test is no t yet approved or cleared by the [place]  FDA and  has been authorized for detection and/or diagnosis of SARS-CoV-2 by FDA under an Emergency Use Authorization (EUA). This EUA will remain  in effect (meaning this test can be used) for the duration of the COVID-19 declaration under Section 564(b)(1) of the Act, 21 U.S.C.section 360bbb-3(b)(1), unless the authorization is terminated  or revoked sooner.       Influenza A by PCR NEGATIVE NEGATIVE Final   Influenza B by PCR NEGATIVE NEGATIVE Final    Comment: (NOTE) The Xpert Xpress SARS-CoV-2/FLU/RSV plus assay is intended as an aid in the diagnosis of influenza from Nasopharyngeal swab specimens and should not be used as a sole basis for treatment. Nasal washings and aspirates are unacceptable for Xpert Xpress SARS-CoV-2/FLU/RSV testing.  Fact Sheet for  Patients: bloggercourse.com  Fact Sheet for Healthcare Providers: seriousbroker.it  This test is not  yet approved or cleared by the [place]  FDA and has been authorized for detection and/or diagnosis of SARS-CoV-2 by FDA under an Emergency Use Authorization (EUA). This EUA will remain in effect (meaning this test can be used) for the duration of the COVID-19 declaration under Section 564(b)(1) of the Act, 21 U.S.C. section 360bbb-3(b)(1), unless the authorization is terminated or revoked.     Resp Syncytial Virus by PCR NEGATIVE NEGATIVE Final    Comment: (NOTE) Fact Sheet for Patients: bloggercourse.com  Fact Sheet for Healthcare Providers: seriousbroker.it  This test is not yet approved or cleared by the [place]  FDA and has been authorized for detection and/or diagnosis of SARS-CoV-2 by FDA under an Emergency Use Authorization (EUA). This EUA will remain in effect (meaning this test can be used) for the duration of the COVID-19 declaration under Section 564(b)(1) of the Act, 21 U.S.C. section 360bbb-3(b)(1), unless the authorization is terminated or revoked.  Performed at [facility], [address] [place], [place] [address]   MRSA Next Gen by PCR, Nasal     Status: None   Collection Time: 11/08/23  4:11 PM   Specimen: Nasal Mucosa; Nasal Swab  Result Value Ref Range Status   MRSA by PCR Next Gen NOT DETECTED NOT DETECTED Final    Comment: (NOTE) The GeneXpert MRSA Assay (FDA approved for NASAL specimens only), is one component of a comprehensive MRSA colonization surveillance program. It is not intended to diagnose MRSA infection nor to guide or monitor treatment for MRSA infections. Test performance is not FDA approved in patients less than 32 years old. Performed at [facility], [address], [place], [place]  [address]   Urine Culture (for pregnant, neutropenic or urologic patients or patients with an indwelling urinary catheter)     Status: None   Collection Time: 11/10/23 11:12 AM   Specimen: Urine, Clean Catch  Result Value Ref Range Status   Specimen Description   Final    URINE, CLEAN CATCH Performed at [facility], [address], [place], [place] [address]    Special Requests   Final    NONE Performed at [facility], [address], [place], [place] [address]    Culture   Final    NO GROWTH Performed at [facility], [address], [place], [place] [address]    Report Status 11/11/2023 FINAL  Final   Labs: CBC: Recent Labs  Lab 11/10/23 0528 11/11/23 0519 11/12/23 0526 11/13/23 0518 11/14/23 0552  WBC 10.0 11.0* 9.6 10.0 7.9  HGB 13.3 11.9* 14.4 13.0 11.5*  HCT 41.7 35.1* 42.2 39.3 34.5*  MCV 95.2 90.5 87.9 91.0 89.6  PLT 406* 353 227 339 334   Basic Metabolic Panel: Recent Labs  Lab 11/10/23 0528 11/11/23 0519 11/12/23 0526 11/13/23 0518 11/14/23 0552  NA 139 132* 133* 134* 135  K 4.8 5.1 3.3* 2.9* 3.0*  CL 103 95* 91* 89* 93*  CO2 17* 23 27 32 32  GLUCOSE 90 109* 97 89 103*  BUN 55* 71* 60* 49* 49*  CREATININE 1.98* 2.43* 1.96* 1.69* 1.46*  CALCIUM  9.6 9.4 8.9 9.0 8.6*  MG  --   --   --  2.1 2.0  PHOS  --   --   --  3.5  --    Liver Function Tests: No results for input(s): AST, ALT, ALKPHOS, BILITOT, PROT, ALBUMIN  in the last 168 hours. CBG: Recent Labs  Lab 11/10/23 2238  GLUCAP 118*   Discharge time spent: greater than 30 minutes.  Signed: Alejandro Marker, DO [facility] 11/16/2023

## 2023-11-14 NOTE — Consult Note (Addendum)
 The patient's psychiatric consultation was conducted, and a brief assessment revealed the following:  The patient, who has end-stage congestive heart failure (CHF) and a generally poor prognosis, also has a history of cocaine use that complicates his medical condition. 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  On this visit, the patient was sitting up in a chair, packed and ready to leave, showing a positive and euthymic mood with a congruent affect. He reported no acute psychiatric concerns or safety issues. His behavior appeared aligned with an anticipation of today's inclement weather, though the cause for the Best Practice Alert (BPA) remains unclear at this time. The patient does not appear to be at imminent risk for harm to himself or others.  Given these observations, the recommendation is to discharge the patient at this time, as inpatient psychiatric consultation would not be beneficial. The primary team has been notified of the discharge plan.  [place]-[facility] No charge

## 2023-11-14 NOTE — Progress Notes (Signed)
 Rounding Note    Patient Name: Dale [name] Date of Encounter: 11/14/2023  [facility] Cardiologist: Ezra Shuck, MD   Subjective   Pt is dressed to go home, receiving IV lasix . He feels his swelling is much improved  Inpatient Medications    Scheduled Meds:  allopurinol   100 mg Oral Daily   amiodarone   200 mg Oral Daily   aspirin  EC  81 mg Oral Daily   clonazePAM   0.5 mg Oral BID   enoxaparin  (LOVENOX ) injection  40 mg Subcutaneous Q24H   midodrine   15 mg Oral TID WC   mirtazapine   15 mg Oral QHS   pantoprazole   40 mg Oral Daily   potassium chloride   40 mEq Oral BID   Continuous Infusions:  furosemide  Stopped (11/14/23 1027)   PRN Meds: acetaminophen  **OR** acetaminophen , hydrALAZINE , ipratropium-albuterol , metoprolol  tartrate, oxyCODONE , prochlorperazine    Vital Signs    Vitals:   11/13/23 1935 11/14/23 0453 11/14/23 0500 11/14/23 0844  BP: 98/82 94/65  113/81  Pulse: 93 83  92  Resp: 20 18  16   Temp: 98.3 F (36.8 C) 98.2 F (36.8 C)  (!) 97.5 F (36.4 C)  TempSrc:    Oral  SpO2: 95% 92%  98%  Weight:   114.9 kg   Height:        Intake/Output Summary (Last 24 hours) at 11/14/2023 1128 Last data filed at 11/13/2023 1827 Gross per 24 hour  Intake 600 ml  Output 200 ml  Net 400 ml      11/14/2023    5:00 AM 11/13/2023    5:03 AM 11/12/2023    5:00 AM  Last 3 Weights  Weight (lbs) 253 lb 4.9 oz 251 lb 5.2 oz 265 lb 10.5 oz  Weight (kg) 114.9 kg 114 kg 120.5 kg      Telemetry    N/A - Personally Reviewed  ECG    No new tracings - Personally Reviewed  Physical Exam   GEN: No acute distress.   Neck: No JVD Cardiac: RRR, no murmurs, rubs, or gallops.  Respiratory: mild crackles in bases GI: Soft, nontender, non-distended  MS: 1+ pitting B LE edema; No deformity. Neuro:  Nonfocal  Psych: Normal affect   Labs    High Sensitivity Troponin:   Recent Labs  Lab 10/24/23 0637 10/24/23 0854  TROPONINIHS 45* 70*      Chemistry Recent Labs  Lab 11/08/23 0706 11/09/23 0615 11/12/23 0526 11/13/23 0518 11/14/23 0552  NA  --    < > 133* 134* 135  K  --    < > 3.3* 2.9* 3.0*  CL  --    < > 91* 89* 93*  CO2  --    < > 27 32 32  GLUCOSE  --    < > 97 89 103*  BUN  --    < > 60* 49* 49*  CREATININE  --    < > 1.96* 1.69* 1.46*  CALCIUM   --    < > 8.9 9.0 8.6*  MG 2.4  --   --  2.1 2.0  GFRNONAA  --    < > 38* 46* 55*  ANIONGAP  --    < > 15 13 10    < > = values in this interval not displayed.    Lipids No results for input(s): CHOL, TRIG, HDL, LABVLDL, LDLCALC, CHOLHDL in the last 168 hours.  Hematology Recent Labs  Lab 11/12/23 0526 11/13/23 0518 11/14/23 0552  WBC  9.6 10.0 7.9  RBC 4.80 4.32 3.85*  HGB 14.4 13.0 11.5*  HCT 42.2 39.3 34.5*  MCV 87.9 91.0 89.6  MCH 30.0 30.1 29.9  MCHC 34.1 33.1 33.3  RDW 16.2* 16.6* 16.2*  PLT 227 339 334   Thyroid No results for input(s): TSH, FREET4 in the last 168 hours.  BNP Recent Labs  Lab 11/07/23 1142  BNP >4,500.0*    DDimer No results for input(s): DDIMER in the last 168 hours.   Radiology    No results found.  Cardiac Studies   Echo 10/2023:  1. Left ventricular ejection fraction, by estimation, is <20%. The left  ventricle has severely decreased function. The left ventricle demonstrates  global hypokinesis. The left ventricular internal cavity size was severely  dilated. Left ventricular  diastolic parameters are consistent with Grade II diastolic dysfunction  (pseudonormalization).   2. Right ventricular systolic function is moderately reduced. The right  ventricular size is severely enlarged. There is normal pulmonary artery  systolic pressure.   3. Left atrial size was severely dilated.   4. Right atrial size was severely dilated.   5. The mitral valve is normal in structure. Mild mitral valve  regurgitation. No evidence of mitral stenosis.   6. The aortic valve is tricuspid. Aortic valve regurgitation  is trivial.  No aortic stenosis is present.   7. The inferior vena cava is dilated in size with >50% respiratory  variability, suggesting right atrial pressure of 8 mmHg.   Patient Profile     63 y.o. male  with end stage HFrEF, pulmonary HTN, CAD, hyperlipidemia, alcohol  use disorder and history of cocaine abuse admitted with acute on chronic systolic and diastolic heart failure.   Assessment & Plan    Acute on chronic combined systolic and diastolic heart failure Hypotension Acute on chronic renal failure - he is not a candidate for advanced heart failure therapies - diuresis / fluid balance is challenging due to hypotension, renal failure, and medication noncompliance - he has been diuresed with high dose IV lasix  and metolazone  - continue midodrine  - IV lasix  held this morning due to hypokalemia, now receiving IV lasix  120 mg - sCr 1.46, K 3.0, Mg 2.0 - he would like to discharge home - he remains volume up but wants to discharge home - pt states he thinks he has furosemide  and torsemide  at home - listed as 40 mg torsemide  BID PTA - given readmission, suspect we will need to discharge on either torsemide  regimen or high dose furosemide  with scheduled metolazone  twice weekly - will discuss with Dr. Raford - consider 100 mg torsemide  BID  - I think we need to make sure we have a good understanding of his potassium supplementation needs prior to discharge   NSVT Continue amiodarone . Not a candidate for ICD Keep K > 4.0 and Mg > 2.0   CAD Hyperlipidemia with LDL goal < 70 Continue ASA. No BP room for GDMT Consider reinstating statin in the absence of alcohol  use      For questions or updates, please contact [facility] Please consult [email or web address] for contact info under        Signed, Jon Nat Hails, PA  11/14/2023, 11:28 AM

## 2023-11-14 NOTE — Progress Notes (Signed)
 PMT no charge note.   Chart reviewed, TOC note reviewed.  Anticipating discharge soon.  Discussed with [facility] MD Dr. Caleen a few days ago.  Has MOST form on file, mother [name] is next of kin.  Recommend outpatient palliative support hopefully once the patient is able to be placed in [place] housing. No charge Lonia Serve MD [place] [facility].

## 2023-11-21 ENCOUNTER — Other Ambulatory Visit (HOSPITAL_COMMUNITY): Payer: Self-pay

## 2023-12-02 ENCOUNTER — Encounter (HOSPITAL_COMMUNITY): Payer: MEDICAID

## 2024-01-14 ENCOUNTER — Telehealth (HOSPITAL_COMMUNITY): Payer: Self-pay | Admitting: Licensed Clinical Social Worker

## 2024-01-14 ENCOUNTER — Other Ambulatory Visit: Payer: Self-pay

## 2024-01-14 ENCOUNTER — Other Ambulatory Visit (HOSPITAL_COMMUNITY): Payer: Self-pay

## 2024-01-14 NOTE — Telephone Encounter (Signed)
 Pt called CSW to inquire about what home agency supplied his oxygen because when he moved to [facility] his provider had to take back equipment and was supposed to send out new equipment from [place] office- per past notes appears to be [employer]- provided with their number.  No further needs at this time  Burna Sis, LCSW Clinical Social Worker [facility] Desk#: [phone] Cell#: [phone]

## 2024-03-09 ENCOUNTER — Other Ambulatory Visit (HOSPITAL_COMMUNITY): Payer: Self-pay
